# Patient Record
Sex: Female | Born: 1955 | Race: Black or African American | Hispanic: No | Marital: Single | Smoking: Current some day smoker
Health system: Southern US, Community
[De-identification: ages and names within clinical notes are randomized; demographics above are authoritative.]

## PROBLEM LIST (undated history)

## (undated) DIAGNOSIS — R0600 Dyspnea, unspecified: Secondary | ICD-10-CM

## (undated) DIAGNOSIS — G459 Transient cerebral ischemic attack, unspecified: Secondary | ICD-10-CM

## (undated) DIAGNOSIS — M1990 Unspecified osteoarthritis, unspecified site: Secondary | ICD-10-CM

## (undated) DIAGNOSIS — F319 Bipolar disorder, unspecified: Secondary | ICD-10-CM

## (undated) DIAGNOSIS — F419 Anxiety disorder, unspecified: Secondary | ICD-10-CM

## (undated) DIAGNOSIS — M51369 Other intervertebral disc degeneration, lumbar region without mention of lumbar back pain or lower extremity pain: Secondary | ICD-10-CM

## (undated) DIAGNOSIS — I499 Cardiac arrhythmia, unspecified: Secondary | ICD-10-CM

## (undated) DIAGNOSIS — R42 Dizziness and giddiness: Secondary | ICD-10-CM

## (undated) DIAGNOSIS — D649 Anemia, unspecified: Secondary | ICD-10-CM

## (undated) DIAGNOSIS — I2510 Atherosclerotic heart disease of native coronary artery without angina pectoris: Secondary | ICD-10-CM

## (undated) DIAGNOSIS — I639 Cerebral infarction, unspecified: Secondary | ICD-10-CM

## (undated) DIAGNOSIS — I4891 Unspecified atrial fibrillation: Secondary | ICD-10-CM

## (undated) DIAGNOSIS — K219 Gastro-esophageal reflux disease without esophagitis: Secondary | ICD-10-CM

## (undated) DIAGNOSIS — J449 Chronic obstructive pulmonary disease, unspecified: Secondary | ICD-10-CM

## (undated) DIAGNOSIS — G629 Polyneuropathy, unspecified: Secondary | ICD-10-CM

## (undated) DIAGNOSIS — M25562 Pain in left knee: Secondary | ICD-10-CM

## (undated) DIAGNOSIS — G8929 Other chronic pain: Secondary | ICD-10-CM

## (undated) DIAGNOSIS — M5126 Other intervertebral disc displacement, lumbar region: Secondary | ICD-10-CM

## (undated) DIAGNOSIS — M5136 Other intervertebral disc degeneration, lumbar region: Secondary | ICD-10-CM

## (undated) HISTORY — DX: Cerebral infarction, unspecified: I63.9

## (undated) HISTORY — DX: Transient cerebral ischemic attack, unspecified: G45.9

## (undated) HISTORY — PX: CHOLECYSTECTOMY: SHX55

## (undated) HISTORY — PX: CARPAL TUNNEL RELEASE: SHX101

## (undated) HISTORY — DX: Dizziness and giddiness: R42

## (undated) HISTORY — PX: ABDOMINAL HYSTERECTOMY: SHX81

---

## 2004-08-29 ENCOUNTER — Emergency Department (HOSPITAL_COMMUNITY): Admission: EM | Admit: 2004-08-29 | Discharge: 2004-08-29 | Payer: Self-pay | Admitting: *Deleted

## 2004-08-30 ENCOUNTER — Ambulatory Visit: Payer: Self-pay | Admitting: Internal Medicine

## 2004-08-30 ENCOUNTER — Ambulatory Visit: Payer: Self-pay | Admitting: *Deleted

## 2004-09-07 ENCOUNTER — Ambulatory Visit: Payer: Self-pay | Admitting: Family Medicine

## 2004-09-09 ENCOUNTER — Ambulatory Visit: Payer: Self-pay | Admitting: Internal Medicine

## 2005-03-17 ENCOUNTER — Inpatient Hospital Stay (HOSPITAL_COMMUNITY): Admission: EM | Admit: 2005-03-17 | Discharge: 2005-03-22 | Payer: Self-pay | Admitting: Emergency Medicine

## 2005-03-25 ENCOUNTER — Ambulatory Visit: Payer: Self-pay | Admitting: Family Medicine

## 2005-06-23 ENCOUNTER — Ambulatory Visit: Payer: Self-pay | Admitting: Internal Medicine

## 2005-06-27 ENCOUNTER — Ambulatory Visit: Payer: Self-pay | Admitting: Internal Medicine

## 2005-06-29 ENCOUNTER — Encounter: Payer: Self-pay | Admitting: *Deleted

## 2005-06-29 ENCOUNTER — Inpatient Hospital Stay (HOSPITAL_COMMUNITY): Admission: EM | Admit: 2005-06-29 | Discharge: 2005-07-05 | Payer: Self-pay | Admitting: Internal Medicine

## 2005-07-06 ENCOUNTER — Ambulatory Visit: Payer: Self-pay | Admitting: Internal Medicine

## 2005-07-06 ENCOUNTER — Emergency Department (HOSPITAL_COMMUNITY): Admission: EM | Admit: 2005-07-06 | Discharge: 2005-07-06 | Payer: Self-pay | Admitting: Emergency Medicine

## 2005-07-08 ENCOUNTER — Ambulatory Visit: Payer: Self-pay | Admitting: Internal Medicine

## 2005-07-12 ENCOUNTER — Ambulatory Visit: Payer: Self-pay | Admitting: Family Medicine

## 2005-07-14 ENCOUNTER — Ambulatory Visit: Payer: Self-pay | Admitting: Internal Medicine

## 2005-07-26 ENCOUNTER — Ambulatory Visit: Payer: Self-pay | Admitting: Internal Medicine

## 2007-02-16 DIAGNOSIS — F1410 Cocaine abuse, uncomplicated: Secondary | ICD-10-CM

## 2007-02-16 DIAGNOSIS — Z9189 Other specified personal risk factors, not elsewhere classified: Secondary | ICD-10-CM | POA: Insufficient documentation

## 2007-02-16 DIAGNOSIS — J45909 Unspecified asthma, uncomplicated: Secondary | ICD-10-CM | POA: Insufficient documentation

## 2007-02-16 DIAGNOSIS — E119 Type 2 diabetes mellitus without complications: Secondary | ICD-10-CM | POA: Insufficient documentation

## 2011-11-30 ENCOUNTER — Encounter (HOSPITAL_COMMUNITY): Payer: Self-pay | Admitting: *Deleted

## 2011-11-30 ENCOUNTER — Emergency Department (HOSPITAL_COMMUNITY)
Admission: EM | Admit: 2011-11-30 | Discharge: 2011-11-30 | Disposition: A | Discharge status: Home / Self Care | Payer: Self-pay | Attending: Emergency Medicine | Admitting: Emergency Medicine

## 2011-11-30 DIAGNOSIS — J45909 Unspecified asthma, uncomplicated: Secondary | ICD-10-CM | POA: Insufficient documentation

## 2011-11-30 DIAGNOSIS — F319 Bipolar disorder, unspecified: Secondary | ICD-10-CM | POA: Insufficient documentation

## 2011-11-30 DIAGNOSIS — E119 Type 2 diabetes mellitus without complications: Secondary | ICD-10-CM | POA: Insufficient documentation

## 2011-11-30 DIAGNOSIS — F17200 Nicotine dependence, unspecified, uncomplicated: Secondary | ICD-10-CM | POA: Insufficient documentation

## 2011-11-30 HISTORY — DX: Bipolar disorder, unspecified: F31.9

## 2011-11-30 LAB — URINALYSIS, ROUTINE W REFLEX MICROSCOPIC
Ketones, ur: NEGATIVE mg/dL
Nitrite: NEGATIVE
Protein, ur: NEGATIVE mg/dL

## 2011-11-30 LAB — RAPID URINE DRUG SCREEN, HOSP PERFORMED
Amphetamines: NOT DETECTED
Barbiturates: NOT DETECTED
Benzodiazepines: NOT DETECTED
Tetrahydrocannabinol: NOT DETECTED

## 2011-11-30 LAB — DIFFERENTIAL
Basophils Absolute: 0.1 10*3/uL (ref 0.0–0.1)
Basophils Relative: 1 % (ref 0–1)
Monocytes Absolute: 0.6 10*3/uL (ref 0.1–1.0)
Neutro Abs: 2.3 10*3/uL (ref 1.7–7.7)
Neutrophils Relative %: 32 % — ABNORMAL LOW (ref 43–77)

## 2011-11-30 LAB — BASIC METABOLIC PANEL
Calcium: 9.8 mg/dL (ref 8.4–10.5)
Chloride: 105 mEq/L (ref 96–112)
Creatinine, Ser: 0.81 mg/dL (ref 0.50–1.10)
GFR calc Af Amer: >90 mL/min (ref >90)
GFR calc non Af Amer: 81 mL/min — ABNORMAL LOW (ref >90)

## 2011-11-30 LAB — CBC
HCT: 40.6 % (ref 36.0–46.0)
MCHC: 33.7 g/dL (ref 30.0–36.0)
RDW: 14.4 % (ref 11.5–15.5)

## 2011-11-30 LAB — ETHANOL: Alcohol, Ethyl (B): <11 mg/dL (ref 0–11)

## 2011-11-30 MED ORDER — QUETIAPINE FUMARATE 25 MG PO TABS: 25.0000 mg | ORAL_TABLET | Freq: Every day | ORAL | Status: DC

## 2011-11-30 MED ORDER — LITHIUM CARBONATE 300 MG PO TABS: 300.0000 mg | ORAL_TABLET | Freq: Three times a day (TID) | ORAL | Status: DC

## 2011-11-30 NOTE — Discharge Instructions (Signed)
Manic Depression (Bipolar Disorder)  Bipolar disorder is also known as manic depressive illness. It is when the brain does not function properly and causes shifts in a person's moods, energy and ability to function in everyday life. These shifts are different from the normal ups and downs that everyone experiences. Instead the shifts are severe. If this goes untreated, the person's life becomes more and more disorderly. People with this disorder can be treated can lead full and productive lives. This disorder must be managed throughout life.   SYMPTOMS    Bipolar disorder causes dramatic mood swings. These mood swings go in cycles. They cycle from extreme "highs" and irritable to deep "lows" of sadness and hopelessness.   Between the extreme moods, there are usually periods of normal mood.   Along with the mood shifts, the person will have severe changes in energy and behavior. The periods of "highs" and "lows" are called episodes of mania and depression.  Signs of mania:   Lots of energy, activity and restlessness.   Extreme "high" or good mood.   Extreme irritability.   Racing thoughts and talking very fast.   Jumping from one idea to another.   Not able to focus, easily distracted.   Little need to sleep.   Grand beliefs in one's abilities and powers.   Spending sprees.   Increased sexual drive. This can result in many sexual partners.   Poor judgment.   Abuse of drugs, particularly cocaine, alcohol, and sleeping medication.   Aggressive or provocative behavior.   A lasting period of behavior that is different from usual.   Denial that anything is wrong.  *A manic episode is identified if a "high" mood happens with three or more of the other symptoms lasting most of the day, nearly everyday for a week or longer. If the mood is more irritable in nature, four additional symptoms must be present.  Signs of depression:   Lasting feelings of sadness, anxiety, or empty mood.   Feelings of  hopelessness with negative thoughts.   Feelings of guilt, worthlessness, or helplessness.   Loss of interest or pleasure in activities once enjoyed, including sex.   Feelings of fatigue or having less energy.   Trouble focusing, making decisions, remembering.   Feeling restless or irritable.   Sleeping too little or too much.   Change in eating with possible weight gain or loss.   Feeling ongoing pain that is not caused by physical illness or injury.   Thoughts of death or suicide or suicide attempts.  *A depressive episode is identified as having five or more of the above symptoms that last most of the day, nearly everyday for two weeks or longer.  CAUSES    Research shows that there is no single cause for the disorder. Many factors act together to produce the illness.   This can be passed down from family (hereditary).   Environment may play a part.  TREATMENT    Long-term treatment is strongly recommended because bipolar disorder is a repeated illness. This disorder is better controlled if treatment is ongoing than if it is off and on.   A combination of medication and talk therapy is best for managing the disorder over time.   Medication.   Medication can be prescribed by a doctor that is an expert in treating mental disorders (psychiatrists). Medications known as "mood stabilizers" are usually prescribed to help control the illness. Other medications can be added when needed. These medicines usually treat episodes   of mania or depression that break through despite the mood stabilizer.   Talk Therapy.   Along with medication, some forms of talk therapy are helpful in providing support, education and guidance to people with the illness and their families. Studies show that this type of treatment increases mood stability, decreases need for hospitalization and improves how they function society.   Electroconvulsive Therapy (ECT).   In extreme situations where the above treatments do not work or  work too slowly to relieve severe symptoms, ECT may be considered.  Document Released: 10/03/2000 Document Revised: 06/16/2011 Document Reviewed: 05/25/2007  [employer] Patient Information 2012 [employer].

## 2011-11-30 NOTE — ED Notes (Signed)
Called for a Telepsych consult. Paper work faxed.

## 2011-11-30 NOTE — ED Notes (Signed)
Up to bathroom, waiting for response from telepsych.

## 2011-11-30 NOTE — BH Assessment (Signed)
Assessment Note   Rhonda Hickman is an 56 y.o. female. The patient was seen earlier today at [facility] . She thought she was there to see a psychiatrist and have her medications restarted. She became agitated when she was told she could not she a physician  for 2 months. She said she would hold them responsible if anything happened that afternoon. At that time, the staff completed IVC paperwork on the patient . The patient l;eft and was served her papers at the [facility]. She denies any thoughts to harm herself or anyone else.  She denies any previous attempts. She denies any history of violence. She is not psychotic. She is a little agitated but is cooperative. At this time the patient denies all the alligations in the petition.   Axis I: Bipolar, Depressed Axis II: Deferred Axis III:  Past Medical History  Diagnosis Date  . Bipolar 1 disorder   . Diabetes mellitus   . Asthma    Axis IV: other psychosocial or environmental problems, problems related to social environment and problems with access to health care services Axis V: 41-50 serious symptoms  Past Medical History:  Past Medical History  Diagnosis Date  . Bipolar 1 disorder   . Diabetes mellitus   . Asthma     Past Surgical History  Procedure Date  . Abdominal hysterectomy     Family History: No family history on file.  Social History:  reports that she has been smoking.  She does not have any smokeless tobacco history on file. She reports that she does not drink alcohol or use illicit drugs.  Additional Social History:    Allergies:  Allergies  Allergen Reactions  . Aspirin Nausea Only    Causes stomach pain  . Codeine     Does not like to take anything with codeine in it due to being recovering addict    Home Medications:  (Not in a hospital admission)  OB/GYN Status:  No LMP recorded. Patient has had a hysterectomy.  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 Previous Attempts/Gestures: No How many times?: 0  Other Self Harm Risks: denies Triggers for Past Attempts: None known Intentional Self Injurious Behavior: None Family Suicide History: No Recent stressful life event(s): Recent negative physical changes (moved to [facility]) Persecutory voices/beliefs?: No Depression: Yes Depression Symptoms: Isolating;Loss of interest in usual pleasures;Feeling angry/irritable Substance abuse history and/or treatment for substance abuse?: Yes Suicide prevention information given to non-admitted patients: Yes  Risk to Others Homicidal Ideation: No Thoughts of Harm to Others: No Current Homicidal Intent: No Current Homicidal Plan: No Access to Homicidal Means: No History of harm to others?: No Assessment of Violence: None Noted Does patient have access to weapons?: No Criminal Charges Pending?: No Does patient have a court date: No  Psychosis Hallucinations: None noted Delusions: None noted  Mental Status Report Appear/Hygiene: Improved Eye Contact: Good Motor Activity: Restlessness;Agitation Speech: Rapid;Logical/coherent Level of Consciousness:  Alert;Restless Mood: Anxious;Depressed Affect: Appropriate to circumstance Anxiety Level: Minimal Thought Processes: Coherent;Relevant Judgement: Unimpaired Orientation: Person;Place;Time;Situation Obsessive  Compulsive Thoughts/Behaviors: None  Cognitive Functioning Concentration: Decreased Memory: Recent Intact;Remote Intact IQ: Average Insight: Fair Impulse Control: Fair Appetite: Good Sleep: Decreased Vegetative Symptoms: None  Prior Inpatient Therapy Prior Inpatient Therapy: Yes Prior Therapy Dates: 2006 or 2007 Prior Therapy Facilty/Provider(s): [place] Reason for Treatment: depresive issues;medication adjustment  Prior Outpatient Therapy Prior Outpatient Therapy: Yes Prior Therapy Dates: was at [facility] today Prior Therapy Facilty/Provider(s): [employer] Reason for Treatment: seeking medications            Values / Beliefs Cultural Requests During Hospitalization: None Spiritual Requests During Hospitalization: None        Additional Information 1:1 In Past 12 Months?: No CIRT Risk: No Elopement Risk: No Does patient have medical clearance?: Yes     Disposition: Patient will be seen for tele-psych to determine the need to proceed with the commitment or to recommend medications and out patient care. Dr.  Adriana Simas in agreement with this plan. C Disposition Disposition of Patient: Other dispositions (recommended tele-psych for commitment evaluation and or medi) Other disposition(s): Other (Comment) (tele-psych)  On Site Evaluation by:   Reviewed with Physician:     Jake Shark [facility] 11/30/2011 4:04 PM

## 2011-11-30 NOTE — ED Notes (Signed)
Meal tray given to pt.

## 2011-11-30 NOTE — ED Notes (Signed)
Still waiting for telepsych consult

## 2011-11-30 NOTE — ED Notes (Signed)
telepsych interview

## 2011-11-30 NOTE — ED Notes (Signed)
Quiet at present, still waiting for telepsych evaluation

## 2011-11-30 NOTE — ED Provider Notes (Signed)
Psychiatry consult complete. Patient has no delusions or cardiac impairment. She denies any suicidal intent or plan. Her IVC papers are rescinded. We'll refill lithium and Seroquel. Followup with community resources this week. BP 154/117  Pulse 93  Temp(Src) 98.2 F (36.8 C) (Oral)  Resp 20  Ht 6' (1.829 m)  Wt 240 lb (108.863 kg)  BMI 32.55 kg/m2  SpO2 97%   Glynn Octave, MD 11/30/11 2026

## 2011-11-30 NOTE — ED Notes (Signed)
Pt alert & oriented x4, stable gait. Pt given discharge instructions, paperwork & prescription(s). Pt verbalized understanding. Pt left department w/ no further questions.

## 2011-11-30 NOTE — ED Provider Notes (Signed)
History     CSN: [id number]  Arrival date & time 11/30/11  1309   First MD Initiated Contact with Patient 11/30/11 1337      Chief Complaint  Patient presents with  . V70.1    (Consider location/radiation/quality/duration/timing/severity/associated sxs/prior treatment) HPI......involuntary commitment on patient who initially presented to local mental health agency to get back on her bipolar medications.  Apparently she was told that she could not see a physician for a couple weeks. She then stated that she would hold the Department responsible for her actions. At that point she was committed. Denies suicidal or homicidal ideation. She apparently goes on meds q2-3 years when she has a flareup of her bipolar illness  Past Medical History  Diagnosis Date  . Bipolar 1 disorder   . Diabetes mellitus   . Asthma     Past Surgical History  Procedure Date  . Abdominal hysterectomy     No family history on file.  History  Substance Use Topics  . Smoking status: Current Everyday Smoker  . Smokeless tobacco: Not on file  . Alcohol Use: No    OB History    Grav Para Term Preterm Abortions TAB SAB Ect Mult Living                  Review of Systems  All other systems reviewed and are negative.    Allergies  Aspirin and Codeine  Home Medications   Current Outpatient Rx  Name Route Sig Dispense Refill  . NAPROXEN SODIUM 220 MG PO TABS Oral Take 1,320 mg by mouth 2 (two) times daily with a meal.      BP 154/117  Pulse 93  Temp(Src) 98.2 F (36.8 C) (Oral)  Resp 20  Ht 6' (1.829 m)  Wt 240 lb (108.863 kg)  BMI 32.55 kg/m2  SpO2 97%  Physical Exam  Nursing note and vitals reviewed. Constitutional: She is oriented to person, place, and time. She appears well-developed and well-nourished.  HENT:  Head: Normocephalic and atraumatic.  Eyes: Conjunctivae and EOM are normal. Pupils are equal, round, and reactive to light.  Neck: Normal range of motion. Neck supple.    Cardiovascular: Normal rate and regular rhythm.   Pulmonary/Chest: Effort normal and breath sounds normal.  Abdominal: Soft. Bowel sounds are normal.  Musculoskeletal: Normal range of motion.  Neurological: She is alert and oriented to person, place, and time.  Skin: Skin is warm and dry.  Psychiatric:       Slight pressured speech and flight of ideas    ED Course  Procedures (including critical care time)  Labs Reviewed  GLUCOSE, CAPILLARY - Abnormal; Notable for the following:    Glucose-Capillary 130 (*)    All other components within normal limits  URINALYSIS, ROUTINE W REFLEX MICROSCOPIC - Abnormal; Notable for the following:    Leukocytes, UA SMALL (*)    All other components within normal limits  DIFFERENTIAL - Abnormal; Notable for the following:    Neutrophils Relative 32 (*)    Lymphocytes Relative 53 (*)    Eosinophils Relative 6 (*)    All other components within normal limits  BASIC METABOLIC PANEL - Abnormal; Notable for the following:    Glucose, Bld 137 (*)    GFR calc non Af Amer 81 (*)    All other components within normal limits  URINE RAPID DRUG SCREEN (HOSP PERFORMED)  CBC  ETHANOL  URINE MICROSCOPIC-ADD ON   No results found.   No diagnosis found.  MDM  Tele psychiatry consult pending.  Awaiting medication recommendations        Donnetta Hutching, MD 11/30/11 1622

## 2011-11-30 NOTE — ED Notes (Signed)
Pt brought to er by [facility] [employer] from [facility] with  IVC. Pt states that she has bipolar and knows that she needs  medications, pt states that she had went to [facility] for help with her bipolar she has not been taking her medications for bipolar for "awhile" , had began to feel anger and rage and knew she needed to get back on her medications, pt was talking to counselors at [facility] and was not allowed to see a dr. There, Pt was going to see someone at health departmntn as well and made statements to staff at [facility] that she would hold them responsible for anything that would happen at the health department. Pt denies any SI/HI at present time, pt in handcuffs with [facility] [employer] department at bedside,

## 2012-02-07 ENCOUNTER — Emergency Department (HOSPITAL_COMMUNITY)
Admission: EM | Admit: 2012-02-07 | Discharge: 2012-02-07 | Disposition: A | Discharge status: Home / Self Care | Payer: Self-pay | Attending: Emergency Medicine | Admitting: Emergency Medicine

## 2012-02-07 ENCOUNTER — Emergency Department (HOSPITAL_COMMUNITY): Payer: Self-pay

## 2012-02-07 ENCOUNTER — Encounter (HOSPITAL_COMMUNITY): Payer: Self-pay | Admitting: *Deleted

## 2012-02-07 DIAGNOSIS — R079 Chest pain, unspecified: Secondary | ICD-10-CM | POA: Insufficient documentation

## 2012-02-07 DIAGNOSIS — R209 Unspecified disturbances of skin sensation: Secondary | ICD-10-CM | POA: Insufficient documentation

## 2012-02-07 DIAGNOSIS — J45909 Unspecified asthma, uncomplicated: Secondary | ICD-10-CM | POA: Insufficient documentation

## 2012-02-07 DIAGNOSIS — F17200 Nicotine dependence, unspecified, uncomplicated: Secondary | ICD-10-CM | POA: Insufficient documentation

## 2012-02-07 DIAGNOSIS — R0602 Shortness of breath: Secondary | ICD-10-CM | POA: Insufficient documentation

## 2012-02-07 DIAGNOSIS — J45901 Unspecified asthma with (acute) exacerbation: Secondary | ICD-10-CM

## 2012-02-07 DIAGNOSIS — R200 Anesthesia of skin: Secondary | ICD-10-CM

## 2012-02-07 LAB — CBC
MCH: 28.9 pg (ref 26.0–34.0)
MCHC: 33.7 g/dL (ref 30.0–36.0)
Platelets: 231 10*3/uL (ref 150–400)
RDW: 14.5 % (ref 11.5–15.5)

## 2012-02-07 LAB — BASIC METABOLIC PANEL
BUN: 11 mg/dL (ref 6–23)
Calcium: 9.5 mg/dL (ref 8.4–10.5)
Creatinine, Ser: 0.79 mg/dL (ref 0.50–1.10)
GFR calc non Af Amer: >90 mL/min (ref >90)
Glucose, Bld: 158 mg/dL — ABNORMAL HIGH (ref 70–99)
Sodium: 141 mEq/L (ref 135–145)

## 2012-02-07 MED ORDER — ALBUTEROL SULFATE (5 MG/ML) 0.5% IN NEBU
5.0000 mg | INHALATION_SOLUTION | Freq: Once | RESPIRATORY_TRACT | Status: AC
  Administered 2012-02-07: 5 mg via RESPIRATORY_TRACT
  Filled 2012-02-07: qty 1

## 2012-02-07 MED ORDER — METHYLPREDNISOLONE SODIUM SUCC 125 MG IJ SOLR
125.0000 mg | Freq: Once | INTRAMUSCULAR | Status: AC
  Administered 2012-02-07: 125 mg via INTRAVENOUS
  Filled 2012-02-07: qty 2

## 2012-02-07 MED ORDER — IPRATROPIUM BROMIDE 0.02 % IN SOLN
0.5000 mg | Freq: Once | RESPIRATORY_TRACT | Status: AC
  Administered 2012-02-07: 0.5 mg via RESPIRATORY_TRACT
  Filled 2012-02-07: qty 2.5

## 2012-02-07 MED ORDER — PREDNISONE 50 MG PO TABS: ORAL_TABLET | ORAL | Status: AC

## 2012-02-07 NOTE — ED Notes (Signed)
Speech clear, neg neuro exam.

## 2012-02-07 NOTE — ED Notes (Signed)
Left sided numbness x 1 month.  Pt reports she was waiting for her upcoming MD appt.  [facility]  Also reporting worsening asthma since this morning.  C/o left sided pain.

## 2012-02-07 NOTE — ED Notes (Signed)
Pt ambulated around nurse's stations without difficulty.  Denies resp distress during ambulation.  Pulse ox after returning to room 100% on RA.  nad noted.  edp notified.

## 2012-02-07 NOTE — ED Provider Notes (Signed)
History  This chart was scribed for Rhonda Gaskins, MD by Rhonda Hickman. This patient was seen in room [facility] and the patient's care was started at 10:09AM.  CSN: [id number]  Arrival date & time 02/07/12  0959   First MD Initiated Contact with Patient 02/07/12 1009      Chief Complaint  Patient presents with  . left side numbness   . Asthma    Patient is a 56 y.o. female presenting with asthma. The history is provided by the patient. No language interpreter was used.  Asthma This is a recurrent problem. The current episode started more than 1 week ago. The problem occurs constantly. The problem has been gradually worsening. Associated symptoms include chest pain and shortness of breath. Pertinent negatives include no abdominal pain and no headaches. Nothing relieves the symptoms.    Rhonda Hickman is a 56 y.o. female who presents to the Emergency Department from the [facility] Department complaining of one month of gradual onset, non-changing, constant left sided numbness starting at the left shoulder and radiating down to her left big toe. She denies facial numbness. She denies weakness, difficulty swallowing or speaking. She reports having a PCP appointment scheduled next week but states that she didn't want to put off her evaluation any longer.  She also c/o one month of gradually worsening, constant non-productive cough with associated left-sided CP and increased SOB attributed to asthma. The CP is described as sharp that is worse with coughing and does not have pain at this time. She states that she was seen at [facility] 2 weeks ago for a syncopal episode attributed to her asthma and was given an albuterol inhaler with no improvement in her symptoms. She denies fever, neck pain, sore throat, visual disturbance, abdominal pain, nausea, emesis, diarrhea, urinary symptoms, back pain, HA, weakness, and rash as associated symptoms.   She is a current everyday smoker but denies  alcohol use.   Past Medical History  Diagnosis Date  . Bipolar 1 disorder   . Diabetes mellitus   . Asthma     Past Surgical History  Procedure Date  . Abdominal hysterectomy     No family history on file.  History  Substance Use Topics  . Smoking status: Current Everyday Smoker  . Smokeless tobacco: Not on file  . Alcohol Use: No     Review of Systems  Constitutional: Negative for fever and chills.  Eyes: Negative for visual disturbance.  Respiratory: Positive for cough, shortness of breath and wheezing.   Cardiovascular: Positive for chest pain.  Gastrointestinal: Negative for nausea, vomiting, abdominal pain and diarrhea.  Neurological: Positive for numbness. Negative for speech difficulty, weakness and headaches.  All other systems reviewed and are negative.    Allergies  Aspirin and Codeine  Home Medications   Current Outpatient Rx  Name Route Sig Dispense Refill  . LITHIUM CARBONATE 300 MG PO TABS Oral Take 1 tablet (300 mg total) by mouth 3 (three) times daily. 30 tablet 0  . NAPROXEN SODIUM 220 MG PO TABS Oral Take 1,320 mg by mouth 2 (two) times daily with a meal.      Triage Vitals: BP 119/74  Pulse 73  Temp 97.7 F (36.5 C) (Oral)  Resp 22  Ht 6' (1.829 m)  Wt 265 lb (120.203 kg)  BMI 35.94 kg/m2  SpO2 97%  Physical Exam  Nursing note and vitals reviewed.  CONSTITUTIONAL: Well developed/well nourished HEAD AND FACE: Normocephalic/atraumatic EYES: EOMI/PERRL ENMT: Mucous membranes moist NECK:  supple no meningeal signs SPINE:entire spine nontender CV: S1/S2 noted, no murmurs/rubs/gallops noted LUNGS: Lungs are clear to auscultation bilaterally, no apparent distress ABDOMEN: soft, nontender, no rebound or guarding GU:no cva tenderness NEURO: Pt is awake/alert, moves all extremitiesx4, no arm or leg drift is noted Reports paresthesias to her left arm/leg.  No focal weakness, and normal grips.  Face is symmetric EXTREMITIES: pulses normal,  full ROM SKIN: warm, color normal PSYCH: no abnormalities of mood noted  ED Course  Procedures   DIAGNOSTIC STUDIES: Oxygen Saturation is 97% on room air, adequate by my interpretation.    COORDINATION OF CARE: 10:28AM-Discussed treatment plan which includes a CXR and breathing treatment with pt at bedside and pt agreed to plan.  Pt improved.  She ambulated without difficulty.  For her asthma, reports similar to prior, she feels improved will start oral prednisone.  For her numbness, this is unchanged without new weakness today or changes today.  CT imaging ?demyelinating disease and I advised close f/u with her PCP and neurologist (referral given)   MDM  Nursing notes including past medical history and social history reviewed and considered in documentation Labs/vital reviewed and considered xrays reviewed and considered   Date: 02/07/2012  Rate: 74  Rhythm: normal sinus rhythm  QRS Axis: normal  Intervals: normal  ST/T Wave abnormalities: normal  Conduction Disutrbances:none    I personally performed the services described in this documentation, which was scribed in my presence. The recorded information has been reviewed and considered.         Rhonda Gaskins, MD 02/07/12 1425

## 2012-04-20 ENCOUNTER — Emergency Department (HOSPITAL_COMMUNITY)
Admission: EM | Admit: 2012-04-20 | Discharge: 2012-04-20 | Discharge status: Against Medical Advice | Payer: Self-pay | Attending: Emergency Medicine | Admitting: Emergency Medicine

## 2012-04-20 ENCOUNTER — Encounter (HOSPITAL_COMMUNITY): Payer: Self-pay | Admitting: *Deleted

## 2012-04-20 DIAGNOSIS — R404 Transient alteration of awareness: Secondary | ICD-10-CM | POA: Insufficient documentation

## 2012-04-20 HISTORY — DX: Anxiety disorder, unspecified: F41.9

## 2012-04-20 LAB — GLUCOSE, CAPILLARY: Glucose-Capillary: 193 mg/dL — ABNORMAL HIGH (ref 70–99)

## 2012-04-20 MED ORDER — ALBUTEROL SULFATE (5 MG/ML) 0.5% IN NEBU
INHALATION_SOLUTION | RESPIRATORY_TRACT | Status: AC
  Administered 2012-04-20: 5 mg
  Filled 2012-04-20: qty 1

## 2012-04-20 MED ORDER — IPRATROPIUM BROMIDE 0.02 % IN SOLN
RESPIRATORY_TRACT | Status: AC
  Administered 2012-04-20: 0.5 mg
  Filled 2012-04-20: qty 2.5

## 2012-04-20 NOTE — ED Notes (Signed)
Patient was at work at [employer] and was unconscious per staff. Upon arrival to er she was alert and oriented to place time and not situation. She stated she did not know what had happened and that she thought it was an anxiety attack

## 2012-04-20 NOTE — ED Notes (Signed)
Patient states she is ready to go, states she is working 2 jobs every day and refuses to stay to be evaluated, ama form signed

## 2012-04-20 NOTE — ED Notes (Signed)
Went in patients room to get her to sign ama papers per her request and she continued to walk out with female at side and stated she did not want to come to hospital and was leaving.

## 2012-04-20 NOTE — ED Notes (Signed)
Patient states she feels fine and does not need anything at this time.

## 2012-04-20 NOTE — ED Notes (Signed)
Patient given breathing tx per respiratory. Questioned respiratory and was informed that pt was wheezing.

## 2012-05-25 ENCOUNTER — Emergency Department (HOSPITAL_COMMUNITY)
Admission: EM | Admit: 2012-05-25 | Discharge: 2012-05-26 | Disposition: A | Discharge status: Home / Self Care | Payer: Self-pay | Attending: Emergency Medicine | Admitting: Emergency Medicine

## 2012-05-25 ENCOUNTER — Encounter (HOSPITAL_COMMUNITY): Payer: Self-pay | Admitting: *Deleted

## 2012-05-25 DIAGNOSIS — F329 Major depressive disorder, single episode, unspecified: Secondary | ICD-10-CM | POA: Insufficient documentation

## 2012-05-25 DIAGNOSIS — F319 Bipolar disorder, unspecified: Secondary | ICD-10-CM | POA: Insufficient documentation

## 2012-05-25 DIAGNOSIS — E119 Type 2 diabetes mellitus without complications: Secondary | ICD-10-CM | POA: Insufficient documentation

## 2012-05-25 DIAGNOSIS — Z79899 Other long term (current) drug therapy: Secondary | ICD-10-CM | POA: Insufficient documentation

## 2012-05-25 DIAGNOSIS — F17200 Nicotine dependence, unspecified, uncomplicated: Secondary | ICD-10-CM | POA: Insufficient documentation

## 2012-05-25 DIAGNOSIS — J45909 Unspecified asthma, uncomplicated: Secondary | ICD-10-CM | POA: Insufficient documentation

## 2012-05-25 DIAGNOSIS — F3289 Other specified depressive episodes: Secondary | ICD-10-CM | POA: Insufficient documentation

## 2012-05-25 DIAGNOSIS — F411 Generalized anxiety disorder: Secondary | ICD-10-CM | POA: Insufficient documentation

## 2012-05-25 LAB — BASIC METABOLIC PANEL
Calcium: 9.2 mg/dL (ref 8.4–10.5)
Creatinine, Ser: 0.95 mg/dL (ref 0.50–1.10)
GFR calc Af Amer: 77 mL/min — ABNORMAL LOW (ref >90)
GFR calc non Af Amer: 66 mL/min — ABNORMAL LOW (ref >90)
Sodium: 138 mEq/L (ref 135–145)

## 2012-05-25 LAB — CBC WITH DIFFERENTIAL/PLATELET
Basophils Absolute: 0 10*3/uL (ref 0.0–0.1)
Basophils Relative: 0 % (ref 0–1)
Eosinophils Relative: 1 % (ref 0–5)
HCT: 36.3 % (ref 36.0–46.0)
Lymphocytes Relative: 47 % — ABNORMAL HIGH (ref 12–46)
MCHC: 33.6 g/dL (ref 30.0–36.0)
MCV: 86.2 fL (ref 78.0–100.0)
Monocytes Absolute: 0.6 10*3/uL (ref 0.1–1.0)
Neutro Abs: 5.1 10*3/uL (ref 1.7–7.7)
Platelets: 220 10*3/uL (ref 150–400)
RDW: 14.3 % (ref 11.5–15.5)
WBC: 11.2 10*3/uL — ABNORMAL HIGH (ref 4.0–10.5)

## 2012-05-25 LAB — VALPROIC ACID LEVEL: Valproic Acid Lvl: 42.9 ug/mL — ABNORMAL LOW (ref 50.0–100.0)

## 2012-05-25 LAB — ETHANOL: Alcohol, Ethyl (B): <11 mg/dL (ref 0–11)

## 2012-05-25 LAB — RAPID URINE DRUG SCREEN, HOSP PERFORMED: Opiates: NOT DETECTED

## 2012-05-25 LAB — GLUCOSE, CAPILLARY: Glucose-Capillary: 213 mg/dL — ABNORMAL HIGH (ref 70–99)

## 2012-05-25 NOTE — ED Provider Notes (Signed)
History     CSN: [id number]  Arrival date & time 05/25/12  2022   First MD Initiated Contact with Patient 05/25/12 2041      Chief Complaint  Patient presents with  . V70.1    (Consider location/radiation/quality/duration/timing/severity/associated sxs/prior treatment) HPI...Marland Kitchendepressed, crying for 3 days.  Has had one previous psychiatric hospitalization years ago.  Uncertain etiology of depression. No frank suicidal or homicidal ideation. Nothing makes symptoms better or worse. Severity is moderate.  Past Medical History  Diagnosis Date  . Bipolar 1 disorder   . Diabetes mellitus   . Asthma   . Anxiety     Past Surgical History  Procedure Date  . Abdominal hysterectomy     History reviewed. No pertinent family history.  History  Substance Use Topics  . Smoking status: Current Every Day Smoker  . Smokeless tobacco: Not on file  . Alcohol Use: No    OB History    Grav Para Term Preterm Abortions TAB SAB Ect Mult Living                  Review of Systems  All other systems reviewed and are negative.    Allergies  Aspirin and Codeine  Home Medications   Current Outpatient Rx  Name  Route  Sig  Dispense  Refill  . ALBUTEROL SULFATE (2.5 MG/3ML) 0.083% IN NEBU   Nebulization   Take 2.5 mg by nebulization every 6 (six) hours as needed.         Marland Kitchen DIVALPROEX SODIUM 500 MG PO TBEC   Oral   Take 500 mg by mouth 2 (two) times daily.         Marland Kitchen METFORMIN HCL 1000 MG PO TABS   Oral   Take 1,000 mg by mouth 2 (two) times daily with a meal.         . NAPROXEN SODIUM 220 MG PO TABS   Oral   Take 440 mg by mouth 2 (two) times daily with a meal.         . TRAZODONE HCL 100 MG PO TABS   Oral   Take 100 mg by mouth at bedtime as needed.           BP 149/99  Pulse 85  Temp 98.5 F (36.9 C) (Oral)  Resp 18  Ht 6' (1.829 m)  Wt 275 lb (124.739 kg)  BMI 37.30 kg/m2  SpO2 99%  Physical Exam  Nursing note and vitals reviewed. Constitutional:  She is oriented to person, place, and time. She appears well-developed and well-nourished.  HENT:  Head: Normocephalic and atraumatic.  Eyes: Conjunctivae normal and EOM are normal. Pupils are equal, round, and reactive to light.  Neck: Normal range of motion. Neck supple.  Cardiovascular: Normal rate, regular rhythm and normal heart sounds.   Pulmonary/Chest: Effort normal and breath sounds normal.  Abdominal: Soft. Bowel sounds are normal.  Musculoskeletal: Normal range of motion.  Neurological: She is alert and oriented to person, place, and time.  Skin: Skin is warm and dry.  Psychiatric:       Flat affect, depressed    ED Course  Procedures (including critical care time)  Labs Reviewed  CBC WITH DIFFERENTIAL - Abnormal; Notable for the following:    WBC 11.2 (*)     Lymphocytes Relative 47 (*)     Lymphs Abs 5.3 (*)     All other components within normal limits  URINE RAPID DRUG SCREEN (HOSP PERFORMED)  BASIC METABOLIC  PANEL  ETHANOL  VALPROIC ACID LEVEL   No results found.   No diagnosis found.    MDM  Behavioral health consult obtained.  Discussed with Dr Valinda Hoar, MD 05/25/12 2144

## 2012-05-25 NOTE — ED Notes (Signed)
Pt in need of her regular nightime medications. Dr Colon Branch aware.
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
## 2012-05-25 NOTE — ED Notes (Signed)
Pt crying inconsolable at times and then laughing hysterically.

## 2012-05-25 NOTE — ED Notes (Signed)
Feels depressed, crying for 3 days

## 2012-05-25 NOTE — ED Notes (Signed)
EDP speaking with pt. 

## 2012-05-26 MED ORDER — LORAZEPAM 1 MG PO TABS: 1.0000 mg | ORAL_TABLET | Freq: Three times a day (TID) | ORAL | Status: DC | PRN

## 2012-05-26 MED ORDER — INSULIN GLARGINE 100 UNIT/ML ~~LOC~~ SOLN
10.0000 [IU] | Freq: Every day | SUBCUTANEOUS | Status: DC
  Administered 2012-05-26: 10 [IU] via SUBCUTANEOUS

## 2012-05-26 MED ORDER — TRAZODONE HCL 50 MG PO TABS
ORAL_TABLET | ORAL | Status: AC
  Filled 2012-05-26: qty 2

## 2012-05-26 MED ORDER — ONDANSETRON HCL 4 MG PO TABS: 4.0000 mg | ORAL_TABLET | Freq: Three times a day (TID) | ORAL | Status: DC | PRN

## 2012-05-26 MED ORDER — LISINOPRIL 2.5 MG PO TABS
2.5000 mg | ORAL_TABLET | Freq: Every day | ORAL | Status: DC
  Filled 2012-05-26 (×2): qty 1

## 2012-05-26 MED ORDER — DIVALPROEX SODIUM 250 MG PO DR TAB: 500.0000 mg | DELAYED_RELEASE_TABLET | Freq: Once | ORAL | Status: DC

## 2012-05-26 MED ORDER — ACETAMINOPHEN 325 MG PO TABS: 650.0000 mg | ORAL_TABLET | ORAL | Status: DC | PRN

## 2012-05-26 MED ORDER — METFORMIN HCL 500 MG PO TABS
1000.0000 mg | ORAL_TABLET | Freq: Two times a day (BID) | ORAL | Status: DC
  Administered 2012-05-26: 1000 mg via ORAL
  Filled 2012-05-26: qty 2

## 2012-05-26 MED ORDER — TRAZODONE HCL 100 MG PO TABS
100.0000 mg | ORAL_TABLET | Freq: Every day | ORAL | Status: DC
  Administered 2012-05-26: 100 mg via ORAL
  Filled 2012-05-26 (×2): qty 1

## 2012-05-26 MED ORDER — ALBUTEROL SULFATE (5 MG/ML) 0.5% IN NEBU
2.5000 mg | INHALATION_SOLUTION | RESPIRATORY_TRACT | Status: DC
  Filled 2012-05-26: qty 0.5

## 2012-05-26 MED ORDER — DIVALPROEX SODIUM ER 500 MG PO TB24: ORAL_TABLET | ORAL | Status: DC

## 2012-05-26 MED ORDER — DIVALPROEX SODIUM 250 MG PO DR TAB
500.0000 mg | DELAYED_RELEASE_TABLET | Freq: Two times a day (BID) | ORAL | Status: DC
  Administered 2012-05-26: 500 mg via ORAL
  Filled 2012-05-26: qty 2

## 2012-05-26 MED ORDER — INSULIN GLARGINE 100 UNIT/ML ~~LOC~~ SOLN
SUBCUTANEOUS | Status: AC
  Filled 2012-05-26: qty 1

## 2012-05-26 MED ORDER — NAPROXEN SODIUM 275 MG PO TABS
550.0000 mg | ORAL_TABLET | Freq: Two times a day (BID) | ORAL | Status: DC
  Filled 2012-05-26 (×3): qty 2

## 2012-05-26 MED ORDER — NAPROXEN 250 MG PO TABS: 500.0000 mg | ORAL_TABLET | Freq: Two times a day (BID) | ORAL | Status: DC

## 2012-05-26 NOTE — ED Notes (Signed)
Pt given meal tray for breakfast.  Belongings returned.

## 2012-05-26 NOTE — ED Notes (Signed)
Pt to sleep here until daylight, then call for ride home. Sitter released

## 2012-05-26 NOTE — ED Notes (Addendum)
Awake, up to bathroom, breakfast ordered
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
## 2012-05-26 NOTE — ED Notes (Signed)
Has been up to bathroom once, otherwise sleeping

## 2012-05-26 NOTE — ED Notes (Signed)
Telepsych consult being done now. 

## 2012-05-26 NOTE — Progress Notes (Signed)
Pt states she only takes nebs prn for wheezes and refused now

## 2012-05-26 NOTE — ED Notes (Addendum)
Patient given several packs of saltine crackers and peanut butter. Given water refused to drink it she wanted to have another Sprite Zero after already having three prior to this. Made her nurse aware of this. Explained to patient that she could not have anymore soft drinks tonight. She stated she was going to go to sleep.

## 2012-06-21 ENCOUNTER — Emergency Department (HOSPITAL_COMMUNITY): Payer: Self-pay

## 2012-06-21 ENCOUNTER — Encounter (HOSPITAL_COMMUNITY): Payer: Self-pay | Admitting: *Deleted

## 2012-06-21 ENCOUNTER — Emergency Department (HOSPITAL_COMMUNITY)
Admission: EM | Admit: 2012-06-21 | Discharge: 2012-06-21 | Disposition: A | Discharge status: Home / Self Care | Payer: Self-pay | Attending: Emergency Medicine | Admitting: Emergency Medicine

## 2012-06-21 DIAGNOSIS — J3489 Other specified disorders of nose and nasal sinuses: Secondary | ICD-10-CM | POA: Insufficient documentation

## 2012-06-21 DIAGNOSIS — E119 Type 2 diabetes mellitus without complications: Secondary | ICD-10-CM | POA: Insufficient documentation

## 2012-06-21 DIAGNOSIS — F17200 Nicotine dependence, unspecified, uncomplicated: Secondary | ICD-10-CM | POA: Insufficient documentation

## 2012-06-21 DIAGNOSIS — Z79899 Other long term (current) drug therapy: Secondary | ICD-10-CM | POA: Insufficient documentation

## 2012-06-21 DIAGNOSIS — R509 Fever, unspecified: Secondary | ICD-10-CM | POA: Insufficient documentation

## 2012-06-21 DIAGNOSIS — F411 Generalized anxiety disorder: Secondary | ICD-10-CM | POA: Insufficient documentation

## 2012-06-21 DIAGNOSIS — Z794 Long term (current) use of insulin: Secondary | ICD-10-CM | POA: Insufficient documentation

## 2012-06-21 DIAGNOSIS — R059 Cough, unspecified: Secondary | ICD-10-CM | POA: Insufficient documentation

## 2012-06-21 DIAGNOSIS — J45909 Unspecified asthma, uncomplicated: Secondary | ICD-10-CM | POA: Insufficient documentation

## 2012-06-21 DIAGNOSIS — J029 Acute pharyngitis, unspecified: Secondary | ICD-10-CM | POA: Insufficient documentation

## 2012-06-21 DIAGNOSIS — F319 Bipolar disorder, unspecified: Secondary | ICD-10-CM | POA: Insufficient documentation

## 2012-06-21 DIAGNOSIS — R05 Cough: Secondary | ICD-10-CM | POA: Insufficient documentation

## 2012-06-21 LAB — CBC WITH DIFFERENTIAL/PLATELET
Basophils Absolute: 0 10*3/uL (ref 0.0–0.1)
HCT: 40.1 % (ref 36.0–46.0)
Hemoglobin: 13.4 g/dL (ref 12.0–15.0)
Lymphocytes Relative: 34 % (ref 12–46)
Monocytes Relative: 9 % (ref 3–12)
Neutro Abs: 5.1 10*3/uL (ref 1.7–7.7)
RDW: 14.2 % (ref 11.5–15.5)
WBC: 9.1 10*3/uL (ref 4.0–10.5)

## 2012-06-21 LAB — BASIC METABOLIC PANEL
BUN: 11 mg/dL (ref 6–23)
Chloride: 101 mEq/L (ref 96–112)
GFR calc Af Amer: >90 mL/min (ref >90)
Potassium: 4.1 mEq/L (ref 3.5–5.1)

## 2012-06-21 LAB — RAPID STREP SCREEN (MED CTR MEBANE ONLY): Streptococcus, Group A Screen (Direct): NEGATIVE

## 2012-06-21 MED ORDER — MAGIC MOUTHWASH W/LIDOCAINE: 10.0000 mL | Freq: Four times a day (QID) | ORAL | Status: DC | PRN

## 2012-06-21 MED ORDER — ONDANSETRON HCL 4 MG/2ML IJ SOLN
4.0000 mg | Freq: Once | INTRAMUSCULAR | Status: AC
  Administered 2012-06-21: 4 mg via INTRAVENOUS
  Filled 2012-06-21: qty 2

## 2012-06-21 MED ORDER — AMOXICILLIN 500 MG PO CAPS: 500.0000 mg | ORAL_CAPSULE | Freq: Three times a day (TID) | ORAL | Status: DC

## 2012-06-21 MED ORDER — MORPHINE SULFATE 4 MG/ML IJ SOLN
4.0000 mg | Freq: Once | INTRAMUSCULAR | Status: AC
  Administered 2012-06-21: 4 mg via INTRAVENOUS
  Filled 2012-06-21: qty 1

## 2012-06-21 MED ORDER — ALBUTEROL SULFATE (5 MG/ML) 0.5% IN NEBU
5.0000 mg | INHALATION_SOLUTION | Freq: Once | RESPIRATORY_TRACT | Status: DC
  Filled 2012-06-21: qty 1

## 2012-06-21 MED ORDER — IBUPROFEN 600 MG PO TABS: 600.0000 mg | ORAL_TABLET | Freq: Four times a day (QID) | ORAL | Status: DC | PRN

## 2012-06-21 MED ORDER — GI COCKTAIL ~~LOC~~
30.0000 mL | Freq: Once | ORAL | Status: AC
  Administered 2012-06-21: 30 mL via ORAL
  Filled 2012-06-21: qty 30

## 2012-06-21 MED ORDER — IOHEXOL 300 MG/ML  SOLN
75.0000 mL | Freq: Once | INTRAMUSCULAR | Status: AC | PRN
  Administered 2012-06-21: 75 mL via INTRAVENOUS

## 2012-06-21 MED ORDER — KETOROLAC TROMETHAMINE 30 MG/ML IJ SOLN
30.0000 mg | Freq: Once | INTRAMUSCULAR | Status: AC
  Administered 2012-06-21: 30 mg via INTRAVENOUS
  Filled 2012-06-21: qty 1

## 2012-06-21 MED ORDER — LORAZEPAM 2 MG/ML IJ SOLN
1.0000 mg | Freq: Once | INTRAMUSCULAR | Status: AC
  Administered 2012-06-21: 1 mg via INTRAVENOUS
  Filled 2012-06-21: qty 1

## 2012-06-21 NOTE — ED Notes (Signed)
Pt states sore throat, ear pain, productive, gray in color. Pt also states a lot of sinus drainage. Symptoms first began Monday.

## 2012-06-21 NOTE — ED Notes (Signed)
Toradol administration checked with J. Idol, EDPa.

## 2012-06-21 NOTE — ED Provider Notes (Signed)
History     CSN: [id number]  Arrival date & time 06/21/12  1220   First MD Initiated Contact with Patient 06/21/12 1352      Chief Complaint  Patient presents with  . Sore Throat  . Cough    (Consider location/radiation/quality/duration/timing/severity/associated sxs/prior treatment) HPI Comments: Rhonda Hickman presents with swelling beneath her chin along with tenderness,  Burning mouth pain,  Especially of her tongue and the sensation of a swollen tongue.  She had a fever 2 days ago, subjective, which has improved.  She describes difficulty swallowing due to pain,  And increased nasal drainage which is described as grey and thick in color .  She denies shortness of breath, wheezing, stridor and chest pain.  She has taken no medications for her symptoms.  She also describes a non productive cough.  The history is provided by the patient.    Past Medical History  Diagnosis Date  . Bipolar 1 disorder   . Diabetes mellitus   . Asthma   . Anxiety     Past Surgical History  Procedure Date  . Abdominal hysterectomy     No family history on file.  History  Substance Use Topics  . Smoking status: Current Every Day Smoker  . Smokeless tobacco: Not on file  . Alcohol Use: No    OB History    Grav Para Term Preterm Abortions TAB SAB Ect Mult Living                  Review of Systems  Constitutional: Positive for fever.  HENT: Positive for congestion, sore throat, rhinorrhea, trouble swallowing, neck pain and voice change. Negative for ear pain, drooling, neck stiffness, dental problem and sinus pressure.   Eyes: Negative.   Respiratory: Negative for chest tightness and shortness of breath.   Cardiovascular: Negative for chest pain.  Gastrointestinal: Negative for nausea, vomiting and abdominal pain.  Genitourinary: Negative.   Musculoskeletal: Negative for joint swelling and arthralgias.  Skin: Negative.  Negative for color change and rash.  Neurological: Negative  for dizziness, weakness, light-headedness, numbness and headaches.  Hematological: Negative.   Psychiatric/Behavioral: Negative.     Allergies  Aspirin and Codeine  Home Medications   Current Outpatient Rx  Name  Route  Sig  Dispense  Refill  . ALBUTEROL SULFATE (2.5 MG/3ML) 0.083% IN NEBU   Nebulization   Take 2.5 mg by nebulization every 6 (six) hours as needed.         Marland Kitchen DIVALPROEX SODIUM ER 500 MG PO TB24   Oral   Take 500-1,000 mg by mouth 2 (two) times daily. Patient takes 1 tablet in the morning and 2 tablets at bedtime         . NOVOLIN L [place]   Subcutaneous   Inject 15 Units into the skin 2 (two) times daily.         Marland Kitchen LISINOPRIL 2.5 MG PO TABS   Oral   Take 2.5 mg by mouth daily.         Marland Kitchen METFORMIN HCL 1000 MG PO TABS   Oral   Take 1,000 mg by mouth 2 (two) times daily with a meal.         . NAPROXEN SODIUM 220 MG PO TABS   Oral   Take 440 mg by mouth 2 (two) times daily with a meal.         . TRAZODONE HCL 100 MG PO TABS   Oral   Take 100 mg  by mouth at bedtime.          Marland Kitchen MAGIC MOUTHWASH W/LIDOCAINE   Oral   Take 10 mLs by mouth 4 (four) times daily as needed (pain).   120 mL   0     Equal parts all ingredients.   . AMOXICILLIN 500 MG PO CAPS   Oral   Take 1 capsule (500 mg total) by mouth 3 (three) times daily.   30 capsule   0   . IBUPROFEN 600 MG PO TABS   Oral   Take 1 tablet (600 mg total) by mouth every 6 (six) hours as needed for pain.   30 tablet   0     BP 137/72  Pulse 91  Temp 97.6 F (36.4 C) (Oral)  Ht 6' (1.829 m)  Wt 270 lb (122.471 kg)  BMI 36.62 kg/m2  SpO2 100%  Physical Exam  Nursing note and vitals reviewed. Constitutional: She appears well-developed and well-nourished.  HENT:  Head: Normocephalic and atraumatic. No trismus in the jaw.  Right Ear: External ear normal.  Left Ear: External ear normal.  Nose: Nose normal. No mucosal edema or rhinorrhea.  Mouth/Throat: Uvula is midline, oropharynx  is clear and moist and mucous membranes are normal. No uvula swelling. No oropharyngeal exudate, posterior oropharyngeal edema, posterior oropharyngeal erythema or tonsillar abscesses.       Tongue, tonsils and posterior pharynx appear normal,  Without edema, erythema or lesions, no swelling.  She is ttp  Subungual space without swelling or induration.  She does appear full bilateral submandible and is ttp without obvious abnormal structures such as indurated lesions,  Fluctuance,  No enlarged nodes present.   Eyes: Conjunctivae normal are normal.  Neck: Normal range of motion. Neck supple. No tracheal deviation, no erythema and normal range of motion present.  Cardiovascular: Normal rate, regular rhythm, normal heart sounds and intact distal pulses.   Pulmonary/Chest: Effort normal and breath sounds normal. No stridor. She has no wheezes. She has no rhonchi. She has no rales.  Abdominal: Soft. Bowel sounds are normal. There is no tenderness.  Musculoskeletal: Normal range of motion.  Neurological: She is alert.  Skin: Skin is warm and dry.  Psychiatric: She has a normal mood and affect.    ED Course  Procedures (including critical care time)  Labs Reviewed  GLUCOSE, CAPILLARY - Abnormal; Notable for the following:    Glucose-Capillary 223 (*)     All other components within normal limits  BASIC METABOLIC PANEL - Abnormal; Notable for the following:    Glucose, Bld 161 (*)     GFR calc non Af Amer 79 (*)     All other components within normal limits  RAPID STREP SCREEN  CBC WITH DIFFERENTIAL   Dg Chest 2 View (if Patient Has Fever And/or Copd)  06/21/2012  *RADIOLOGY REPORT*  Clinical Data: Cough.  CHEST - 2 VIEW  Comparison: February 07, 2012.  Findings: Cardiomediastinal silhouette appears normal.  No acute pulmonary disease is noted.  Bony thorax is intact.  IMPRESSION: No acute cardiopulmonary abnormality seen.   Original Report Authenticated By: Lupita Raider.,  M.D.    Ct Soft  Tissue Neck W Contrast  06/21/2012  *RADIOLOGY REPORT*  Clinical Data: Severe pharyngitis and cough.  CT NECK WITH CONTRAST  Technique:  Multidetector CT imaging of the neck was performed with intravenous contrast.  Contrast: 75mL OMNIPAQUE IOHEXOL 300 MG/ML  SOLN  Comparison: None.  Findings: Normal appearing soft tissues without mass  or abscess. No enlarged lymph nodes.  Normal appearing airway.  Clear lung apices.  Minimal reversal of the normal cervical lordosis and minimal scoliosis.  Mild multilevel degenerative changes in the cervical spine.  IMPRESSION: No acute abnormality.   Original Report Authenticated By: Beckie Salts, M.D.      1. Pharyngitis, acute       MDM  Pt was seen by Dr Estell Harpin who agreed with ct soft tissue neck.  Pt was given IV morphine and she appeared to be comfortable while in dept.  Upon hearing results of normal Ct scan she became increasingly agitated and upset,  Started crying,  Stating she cannot swallow and proceeded to start spitting her saliva into a trash can,  Although was able to swallow (uncomfortably) prior to time of dc.  She was given toradol 30 mg IV for antiinflammatory affect,  Also given ativan 1 mg IV for anxiety.  Gi cocktail gargle for topical relief.    Pt prescribed amoxil, ibuprofen, magic mouthwash with lidocaine.  Encouraged f/u by pcp tomorrow or return here if sx worsen.  Pt stable at time of dc.  She has family member driving her home.          Burgess Amor, [place] 06/21/12 1801

## 2012-06-21 NOTE — ED Notes (Signed)
Pt with no adverse effect from Toradol admin. Pt able to ambulate independently, no itching, no breathing difficulties. Pt reports pain relief and "feeling better."

## 2012-06-22 NOTE — ED Provider Notes (Signed)
Medical screening examination/treatment/procedure(s) were performed by non-physician practitioner and as supervising physician I was immediately available for consultation/collaboration.   Benny Lennert, MD 06/22/12 [phone]

## 2013-01-07 ENCOUNTER — Emergency Department (HOSPITAL_COMMUNITY)
Admission: EM | Admit: 2013-01-07 | Discharge: 2013-01-08 | Disposition: A | Discharge status: Other Acute Inpatient Hospital | Payer: Self-pay | Attending: Emergency Medicine | Admitting: Emergency Medicine

## 2013-01-07 ENCOUNTER — Encounter (HOSPITAL_COMMUNITY): Payer: Self-pay | Admitting: *Deleted

## 2013-01-07 ENCOUNTER — Emergency Department (HOSPITAL_COMMUNITY): Payer: Self-pay

## 2013-01-07 DIAGNOSIS — Z5900 Homelessness unspecified: Secondary | ICD-10-CM | POA: Insufficient documentation

## 2013-01-07 DIAGNOSIS — R443 Hallucinations, unspecified: Secondary | ICD-10-CM | POA: Insufficient documentation

## 2013-01-07 DIAGNOSIS — J45909 Unspecified asthma, uncomplicated: Secondary | ICD-10-CM | POA: Insufficient documentation

## 2013-01-07 DIAGNOSIS — R0789 Other chest pain: Secondary | ICD-10-CM | POA: Insufficient documentation

## 2013-01-07 DIAGNOSIS — R45851 Suicidal ideations: Secondary | ICD-10-CM | POA: Insufficient documentation

## 2013-01-07 DIAGNOSIS — E119 Type 2 diabetes mellitus without complications: Secondary | ICD-10-CM | POA: Insufficient documentation

## 2013-01-07 DIAGNOSIS — F17200 Nicotine dependence, unspecified, uncomplicated: Secondary | ICD-10-CM | POA: Insufficient documentation

## 2013-01-07 DIAGNOSIS — Z79899 Other long term (current) drug therapy: Secondary | ICD-10-CM | POA: Insufficient documentation

## 2013-01-07 DIAGNOSIS — F29 Unspecified psychosis not due to a substance or known physiological condition: Secondary | ICD-10-CM | POA: Insufficient documentation

## 2013-01-07 DIAGNOSIS — F319 Bipolar disorder, unspecified: Secondary | ICD-10-CM | POA: Insufficient documentation

## 2013-01-07 DIAGNOSIS — Z794 Long term (current) use of insulin: Secondary | ICD-10-CM | POA: Insufficient documentation

## 2013-01-07 DIAGNOSIS — F411 Generalized anxiety disorder: Secondary | ICD-10-CM | POA: Insufficient documentation

## 2013-01-07 LAB — CBC WITH DIFFERENTIAL/PLATELET
HCT: 36.7 % (ref 36.0–46.0)
Hemoglobin: 12.5 g/dL (ref 12.0–15.0)
Lymphocytes Relative: 43 % (ref 12–46)
Lymphs Abs: 2.9 10*3/uL (ref 0.7–4.0)
MCHC: 34.1 g/dL (ref 30.0–36.0)
Monocytes Absolute: 0.4 10*3/uL (ref 0.1–1.0)
Monocytes Relative: 6 % (ref 3–12)
Neutro Abs: 3.3 10*3/uL (ref 1.7–7.7)
RBC: 4.33 MIL/uL (ref 3.87–5.11)
WBC: 6.7 10*3/uL (ref 4.0–10.5)

## 2013-01-07 LAB — BASIC METABOLIC PANEL
BUN: 12 mg/dL (ref 6–23)
CO2: 24 mEq/L (ref 19–32)
Chloride: 103 mEq/L (ref 96–112)
Creatinine, Ser: 0.8 mg/dL (ref 0.50–1.10)
Glucose, Bld: 221 mg/dL — ABNORMAL HIGH (ref 70–99)

## 2013-01-07 LAB — RAPID URINE DRUG SCREEN, HOSP PERFORMED
Amphetamines: NOT DETECTED
Barbiturates: NOT DETECTED
Benzodiazepines: NOT DETECTED
Cocaine: NOT DETECTED

## 2013-01-07 LAB — GLUCOSE, CAPILLARY
Glucose-Capillary: 191 mg/dL — ABNORMAL HIGH (ref 70–99)
Glucose-Capillary: 187 mg/dL — ABNORMAL HIGH (ref 70–99)
Glucose-Capillary: 132 mg/dL — ABNORMAL HIGH (ref 70–99)
Glucose-Capillary: 121 mg/dL — ABNORMAL HIGH (ref 70–99)

## 2013-01-07 MED ORDER — LORAZEPAM 1 MG PO TABS: 1.0000 mg | ORAL_TABLET | Freq: Three times a day (TID) | ORAL | Status: DC | PRN

## 2013-01-07 MED ORDER — METFORMIN HCL 500 MG PO TABS
1000.0000 mg | ORAL_TABLET | Freq: Two times a day (BID) | ORAL | Status: DC
  Administered 2013-01-07 – 2013-01-08 (×2): 1000 mg via ORAL
  Filled 2013-01-07 (×2): qty 2

## 2013-01-07 MED ORDER — NICOTINE 21 MG/24HR TD PT24
21.0000 mg | MEDICATED_PATCH | Freq: Every day | TRANSDERMAL | Status: DC
  Filled 2013-01-07: qty 1

## 2013-01-07 MED ORDER — ACETAMINOPHEN 325 MG PO TABS: 650.0000 mg | ORAL_TABLET | ORAL | Status: DC | PRN

## 2013-01-07 MED ORDER — LISINOPRIL 5 MG PO TABS
2.5000 mg | ORAL_TABLET | Freq: Every day | ORAL | Status: DC
  Administered 2013-01-07: 2.5 mg via ORAL
  Filled 2013-01-07: qty 1

## 2013-01-07 MED ORDER — ALBUTEROL SULFATE HFA 108 (90 BASE) MCG/ACT IN AERS
2.0000 | INHALATION_SPRAY | Freq: Four times a day (QID) | RESPIRATORY_TRACT | Status: DC | PRN
Start: 2013-01-07 — End: 2013-01-08
  Administered 2013-01-07: 2 via RESPIRATORY_TRACT
  Filled 2013-01-07: qty 6.7

## 2013-01-07 MED ORDER — ALBUTEROL SULFATE (5 MG/ML) 0.5% IN NEBU: 5.0000 mg | INHALATION_SOLUTION | RESPIRATORY_TRACT | Status: DC | PRN

## 2013-01-07 MED ORDER — ALUM & MAG HYDROXIDE-SIMETH 200-200-20 MG/5ML PO SUSP: 30.0000 mL | ORAL | Status: DC | PRN

## 2013-01-07 MED ORDER — ONDANSETRON HCL 4 MG PO TABS: 4.0000 mg | ORAL_TABLET | Freq: Three times a day (TID) | ORAL | Status: DC | PRN

## 2013-01-07 MED ORDER — METFORMIN HCL 500 MG PO TABS: 1000.0000 mg | ORAL_TABLET | Freq: Two times a day (BID) | ORAL | Status: DC

## 2013-01-07 MED ORDER — LITHIUM CARBONATE 300 MG PO CAPS
300.0000 mg | ORAL_CAPSULE | Freq: Three times a day (TID) | ORAL | Status: DC
  Administered 2013-01-07 – 2013-01-08 (×2): 300 mg via ORAL
  Filled 2013-01-07 (×3): qty 1

## 2013-01-07 MED ORDER — GLIPIZIDE 10 MG PO TABS
20.0000 mg | ORAL_TABLET | Freq: Two times a day (BID) | ORAL | Status: DC
  Administered 2013-01-07 – 2013-01-08 (×2): 20 mg via ORAL
  Filled 2013-01-07 (×4): qty 2

## 2013-01-07 MED ORDER — INSULIN ASPART PROT & ASPART (70-30 MIX) 100 UNIT/ML ~~LOC~~ SUSP
10.0000 [IU] | Freq: Two times a day (BID) | SUBCUTANEOUS | Status: DC
  Administered 2013-01-07 – 2013-01-08 (×2): 10 [IU] via SUBCUTANEOUS
  Filled 2013-01-07: qty 10

## 2013-01-07 MED ORDER — TRAZODONE HCL 150 MG PO TABS
150.0000 mg | ORAL_TABLET | Freq: Every day | ORAL | Status: DC
  Administered 2013-01-07: 150 mg via ORAL
  Filled 2013-01-07 (×2): qty 1

## 2013-01-07 MED ORDER — INSULIN ASPART PROT & ASPART (70-30 MIX) 100 UNIT/ML ~~LOC~~ SUSP
10.0000 [IU] | Freq: Two times a day (BID) | SUBCUTANEOUS | Status: DC
  Filled 2013-01-07: qty 10

## 2013-01-07 MED ORDER — DIVALPROEX SODIUM ER 500 MG PO TB24
1000.0000 mg | ORAL_TABLET | Freq: Two times a day (BID) | ORAL | Status: DC
  Administered 2013-01-07 (×2): 1000 mg via ORAL
  Filled 2013-01-07 (×5): qty 2

## 2013-01-07 MED ORDER — ZOLPIDEM TARTRATE 5 MG PO TABS
10.0000 mg | ORAL_TABLET | Freq: Every evening | ORAL | Status: DC | PRN
  Administered 2013-01-07: 10 mg via ORAL
  Filled 2013-01-07: qty 2
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
## 2013-01-07 NOTE — ED Notes (Signed)
Per [facility]: Pt can be sent over after 7 am to the [facility] A unit with Dr. Forrestine Him accepting.  Report to be called to [phone].
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
## 2013-01-07 NOTE — ED Notes (Signed)
Per ACT pt pending at [facility]
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
## 2013-01-07 NOTE — ED Notes (Signed)
Pt accepted at [facility] but does not have a bed assignment yet but will be transported 9 am tomorrow. [facility] officer aware and Charge RN aware.
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
## 2013-01-07 NOTE — ED Notes (Signed)
Pt sleeping chest rise and fall

## 2013-01-08 NOTE — ED Provider Notes (Signed)
[facility] accepted at [facility] by Dr Forrestine Him.     CBG 150 this am.   Devoria Albe, MD, Franz Dell, MD 01/08/13 [phone]

## 2013-04-10 ENCOUNTER — Encounter (HOSPITAL_COMMUNITY): Payer: Self-pay

## 2013-04-10 ENCOUNTER — Emergency Department (HOSPITAL_COMMUNITY)
Admission: EM | Admit: 2013-04-10 | Discharge: 2013-04-10 | Disposition: A | Discharge status: Home / Self Care | Payer: Self-pay | Attending: Emergency Medicine | Admitting: Emergency Medicine

## 2013-04-10 ENCOUNTER — Emergency Department (HOSPITAL_COMMUNITY): Payer: Self-pay

## 2013-04-10 DIAGNOSIS — E119 Type 2 diabetes mellitus without complications: Secondary | ICD-10-CM | POA: Insufficient documentation

## 2013-04-10 DIAGNOSIS — Y9289 Other specified places as the place of occurrence of the external cause: Secondary | ICD-10-CM | POA: Insufficient documentation

## 2013-04-10 DIAGNOSIS — Z79899 Other long term (current) drug therapy: Secondary | ICD-10-CM | POA: Insufficient documentation

## 2013-04-10 DIAGNOSIS — X500XXA Overexertion from strenuous movement or load, initial encounter: Secondary | ICD-10-CM | POA: Insufficient documentation

## 2013-04-10 DIAGNOSIS — F17200 Nicotine dependence, unspecified, uncomplicated: Secondary | ICD-10-CM | POA: Insufficient documentation

## 2013-04-10 DIAGNOSIS — J45909 Unspecified asthma, uncomplicated: Secondary | ICD-10-CM | POA: Insufficient documentation

## 2013-04-10 DIAGNOSIS — IMO0002 Reserved for concepts with insufficient information to code with codable children: Secondary | ICD-10-CM | POA: Insufficient documentation

## 2013-04-10 DIAGNOSIS — W010XXA Fall on same level from slipping, tripping and stumbling without subsequent striking against object, initial encounter: Secondary | ICD-10-CM | POA: Insufficient documentation

## 2013-04-10 DIAGNOSIS — Z794 Long term (current) use of insulin: Secondary | ICD-10-CM | POA: Insufficient documentation

## 2013-04-10 DIAGNOSIS — F411 Generalized anxiety disorder: Secondary | ICD-10-CM | POA: Insufficient documentation

## 2013-04-10 DIAGNOSIS — Y9389 Activity, other specified: Secondary | ICD-10-CM | POA: Insufficient documentation

## 2013-04-10 DIAGNOSIS — S8392XA Sprain of unspecified site of left knee, initial encounter: Secondary | ICD-10-CM

## 2013-04-10 MED ORDER — IBUPROFEN 600 MG PO TABS
600.0000 mg | ORAL_TABLET | Freq: Four times a day (QID) | ORAL | Status: DC | PRN
Start: 2013-04-10 — End: 2013-04-10

## 2013-04-10 MED ORDER — IBUPROFEN 800 MG PO TABS
800.0000 mg | ORAL_TABLET | Freq: Once | ORAL | Status: DC
  Filled 2013-04-10: qty 1

## 2013-04-10 MED ORDER — TRAMADOL HCL 50 MG PO TABS
50.0000 mg | ORAL_TABLET | Freq: Once | ORAL | Status: AC
  Administered 2013-04-10: 50 mg via ORAL
  Filled 2013-04-10: qty 1

## 2013-04-10 MED ORDER — TRAMADOL HCL 50 MG PO TABS
50.0000 mg | ORAL_TABLET | Freq: Four times a day (QID) | ORAL | Status: DC | PRN
Start: 2013-04-10 — End: 2013-06-03

## 2013-04-10 MED ORDER — KETOROLAC TROMETHAMINE 60 MG/2ML IM SOLN
60.0000 mg | Freq: Once | INTRAMUSCULAR | Status: AC
  Administered 2013-04-10: 60 mg via INTRAMUSCULAR
  Filled 2013-04-10: qty 2

## 2013-04-10 MED ORDER — TRAMADOL HCL 50 MG PO TABS
50.0000 mg | ORAL_TABLET | Freq: Four times a day (QID) | ORAL | Status: DC | PRN
Start: 2013-04-10 — End: 2013-04-10

## 2013-04-10 NOTE — ED Notes (Signed)
Pt reports tripping and falling into a 'baby bed", now having left knee pain, was dropped off by staff from shelter.

## 2013-04-11 NOTE — ED Provider Notes (Signed)
CSN: [id number]     Arrival date & time 04/10/13  1808 History   First MD Initiated Contact with Patient 04/10/13 1936     Chief Complaint  Patient presents with  . Knee Pain   (Consider location/radiation/quality/duration/timing/severity/associated sxs/prior Treatment) HPI Comments: Rhonda Hickman is a 57 y.o. Female presenting with left knee pain and swelling after tripping and landing headfirst into a baby bed, somehow twisting her left knee during the fall which occurred today.  Her pain is constant and sharp, worsened with palpation and weight bearing.  She has taken no medicines prior to arrival.  She was given an ice pack when arriving here which has helped some.  There is no radiation of pain and she denies any other injury from the fall.     The history is provided by the patient.    Past Medical History  Diagnosis Date  . Bipolar 1 disorder   . Diabetes mellitus   . Asthma   . Anxiety    Past Surgical History  Procedure Laterality Date  . Abdominal hysterectomy     No family history on file. History  Substance Use Topics  . Smoking status: Current Every Day Smoker    Types: Cigarettes  . Smokeless tobacco: Not on file  . Alcohol Use: No   OB History   Grav Para Term Preterm Abortions TAB SAB Ect Mult Living                 Review of Systems  Constitutional: Negative.   HENT: Negative for neck pain.   Musculoskeletal: Positive for joint swelling and arthralgias. Negative for myalgias and back pain.  Skin: Negative for wound.  Neurological: Negative for weakness and numbness.    Allergies  Aspirin; Codeine; and Ibuprofen  Home Medications   Current Outpatient Rx  Name  Route  Sig  Dispense  Refill  . albuterol (PROVENTIL HFA;VENTOLIN HFA) 108 (90 BASE) MCG/ACT inhaler   Inhalation   Inhale 2 puffs into the lungs every 6 (six) hours as needed for wheezing.         . divalproex (DEPAKOTE ER) 500 MG 24 hr tablet   Oral   Take 1,000 mg by mouth 2  (two) times daily.          Marland Kitchen glipiZIDE (GLUCOTROL) 10 MG tablet   Oral   Take 20 mg by mouth 2 (two) times daily before a meal.         . insulin NPH-regular (NOVOLIN 70/30) (70-30) 100 UNIT/ML injection   Subcutaneous   Inject 20 Units into the skin 2 (two) times daily with a meal.          . metFORMIN (GLUCOPHAGE) 1000 MG tablet   Oral   Take 1,000 mg by mouth 2 (two) times daily with a meal.          . naproxen sodium (ALEVE) 220 MG tablet   Oral   Take 220-440 mg by mouth 2 (two) times daily as needed (for pain).         . traZODone (DESYREL) 100 MG tablet   Oral   Take 200 mg by mouth at bedtime.         Marland Kitchen albuterol (PROVENTIL) (2.5 MG/3ML) 0.083% nebulizer solution   Nebulization   Take 2.5 mg by nebulization every 6 (six) hours as needed for shortness of breath.          . lisinopril (PRINIVIL,ZESTRIL) 2.5 MG tablet   Oral   Take  2.5 mg by mouth daily.         . traMADol (ULTRAM) 50 MG tablet   Oral   Take 1 tablet (50 mg total) by mouth every 6 (six) hours as needed for pain.   15 tablet   0    BP 108/68  Pulse 88  Temp(Src) 98.2 F (36.8 C) (Oral)  Resp 20  Ht 6' (1.829 m)  Wt 280 lb (127.007 kg)  BMI 37.97 kg/m2  SpO2 99% Physical Exam  Constitutional: She appears well-developed and well-nourished.  HENT:  Head: Atraumatic.  Neck: Normal range of motion.  Cardiovascular:  Pulses equal bilaterally  Musculoskeletal: She exhibits tenderness.       Left knee: She exhibits decreased range of motion and swelling. She exhibits no ecchymosis, no deformity, no laceration, no erythema, no LCL laxity and no MCL laxity. Tenderness found. Medial joint line tenderness noted.  Edema noted along medial left patella.    Neurological: She is alert. She has normal strength. She displays normal reflexes. No sensory deficit.  Equal strength  Skin: Skin is warm and dry.  Psychiatric: She has a normal mood and affect.    ED Course  Procedures  (including critical care time) Labs Review Labs Reviewed - No data to display Imaging Review Dg Knee Complete 4 Views Left  04/10/2013   CLINICAL DATA:  Larey Seat. Injured left knee.  EXAM: LEFT KNEE - COMPLETE 4+ VIEW  COMPARISON:  None.  FINDINGS: Mild tricompartmental degenerative changes with early joint space narrowing and spurring. No acute fracture or osteochondral abnormality. The bony densities near the medial femoral condyle likely reflect a remote MCL injury (Pellegrini-Stieda). 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 No joint effusion.  IMPRESSION: Mild/early degenerative changes but no acute bony findings or joint effusion.  Remote MCL injury.   Electronically Signed   By: Loralie Champagne M.D.   On: 04/10/2013 18:44    MDM   1. Knee sprain and strain, left, initial encounter    Patients labs and/or radiological studies were viewed and considered during the medical decision making and disposition process. Pt placed in knee immobilizer, crutches given.  Prescribed tramadol for pain relief, pt states unable to tolerate nsaids.  RICE, referral to ortho for further management if not improved over the next week.  Pt is currently staying at a local shelter.      Burgess Amor, PA-C 04/11/13 1421

## 2013-04-16 NOTE — ED Provider Notes (Signed)
Medical screening examination/treatment/procedure(s) were performed by non-physician practitioner and as supervising physician I was immediately available for consultation/collaboration. Bella Brummet, MD, FACEP   Siobhan Zaro L Magic Mohler, MD 04/16/13 1101 

## 2013-05-08 ENCOUNTER — Ambulatory Visit: Payer: Self-pay | Admitting: Orthopedic Surgery

## 2013-05-22 ENCOUNTER — Ambulatory Visit (INDEPENDENT_AMBULATORY_CARE_PROVIDER_SITE_OTHER): Payer: Self-pay | Admitting: Orthopedic Surgery

## 2013-05-22 VITALS — BP 117/72 | Ht 72.0 in | Wt 275.0 lb

## 2013-05-22 DIAGNOSIS — M23329 Other meniscus derangements, posterior horn of medial meniscus, unspecified knee: Secondary | ICD-10-CM

## 2013-05-22 DIAGNOSIS — M23322 Other meniscus derangements, posterior horn of medial meniscus, left knee: Secondary | ICD-10-CM

## 2013-05-22 NOTE — Progress Notes (Signed)
Patient ID: Rhonda Hickman, female   DOB: Dec 01, 1955, 57 y.o.   MRN: [id number]  Chief Complaint  Patient presents with  . Knee Pain    Left knee pain. Tripped and fell over 1 month ago.    HISTORY: 57 rolled female fell on October 1 over a bag of luggage injured her left knee. She went to the ER she wore a knee immobilizer and use crutches for several weeks did not improve. She has a Therapist, occupational and we are seeing her for evaluation and treatment  She complains of medial knee pain intermittent and recurrent swelling 9/10 pain which is described as constant, throbbing, stabbing and burning associated with locking and catching and loss of motion.  Review of systems shortness of breath wheezing and cough with tightness of the chest  Heartburn, joint pain swelling stiffness numbness tingling depression although the systems were negative she is allergic to codeine and aspirin.  She has a history of asthma and bipolar disease or diabetes she's had uterine surgery. She takes Depakote glipizide lisinopril metformin insulin family history of asthma and diabetes cancer social history single doesn't work smokes 3 packs of cigarettes per day doesn't drink  BP 117/72  Ht 6' (1.829 m)  Wt 275 lb (124.739 kg)  BMI 37.29 kg/m2 General appearance is normal she has obesity. She is oriented x3 mood is pleasant.  Gait and station are abnormal with a limp  Right left upper extremity normal to inspection. No range of motion deficits. No instability or subluxation. Strength normal. Skin normal. Normal pulses perfusion and temperature without edema.  Right lower extremity inspection was normal palpation was normal range of motion was normal stability tests were normal strength tests were normal skin was normal pulse was normal temperature was normal sensation was normal  Left lower extremity no joint effusion today painful range of motion with loss of extension 5 flexion and point was .120 painful ARC of  motion stability tests were normal strength was normal McMurray sign was positive tenderness along the medial joint line. Skin normal pulse normal lymph nodes negative sensation normal.  Initial film showed no fracture  Impression Encounter Diagnosis  Name Primary?  . Medial meniscus, posterior horn derangement, left Yes    Recommend MRI left knee  I will call her with the results

## 2013-05-22 NOTE — Patient Instructions (Addendum)
MRI ordered  Meniscus Injury, Arthroscopy Arthroscopy is a surgical procedure that involves the use of a small scope that has a camera and surgical instruments on the end (arthroscope). An arthroscope can be used to repair your meniscus injury.  LET [facility] CARE PROVIDER KNOW ABOUT:  Any allergies you have.  All medicines you are taking, including vitamins, herbs, eyedrops, creams, and over-the-counter medicines.  Any recent colds or infections you have had or currently have.  Previous problems you or members of your family have had with the use of anesthetics.  Any blood disorders or blood clotting problems you have.  Previous surgeries you have had.  Medical conditions you have. RISKS AND COMPLICATIONS Generally, this is a safe procedure. However, as with any procedure, complications can occur. Possible complications include:  Damage to nerves or blood vessels.  Excess bleeding.  Blood clots.  Infection. BEFORE THE PROCEDURE  Do not eat or drink for 6 8 hours before the procedure.  Take medicines as directed by your surgeon. Ask your surgeon about changing or stopping your regular medicines.  You may have lab tests the morning of surgery. PROCEDURE  You will be given one of the following:   A medicine that numbs the area (local anesthesia).  A medicine that makes you go to sleep (general anesthesia).  A medicine injected into your spine that numbs your body below the waist (spinal anesthesia). Most often, several small cuts (incisions) are made in the knee. The arthroscope and instruments go into the incisions to repair the damage. The torn portion of the meniscus are removed.  During this time, your surgeon may find a partial or complete tear in a cruciate ligament, such as the anterior cruciate ligament (ACL). A completely torn cruciate ligament is reconstructed by taking tissue from another part of the body (grafting) and placing it into the injured area. This  requires several larger incisions to complete the repair. Sometimes, open surgery is needed for collateral ligament injuries. If a collateral ligament is found to be injured, your surgeon may staple or suture the tear through a slightly larger incision on the side of the knee. AFTER THE PROCEDURE You will be taken to the recovery area where your progress will be monitored. When you are awake, stable, and taking fluids without complications, you will be allowed to go home. This is usually the same day. However, more extensive repairs of a ligament may require an overnight stay.  The recovery time after repairing your meniscus or ligament depends on the amount of damage to these structures. It also depends on whether or not reconstructive knee surgery was needed.   A torn or stretched ligament (ligament sprain) may take 6 8 weeks to heal. It takes about the same amount of time if your surgeon removed a torn meniscus.  A repaired meniscus may require 6 12 weeks of recovery time.  A torn ligament needing reconstructive surgery may take 6 12 months to heal fully. Document Released: 06/24/2000 Document Revised: 02/27/2013 Document Reviewed: 11/23/2012 [facility] Patient Information 2014 [place], [place].

## 2013-05-27 ENCOUNTER — Encounter: Payer: Self-pay | Admitting: Orthopedic Surgery

## 2013-05-29 ENCOUNTER — Ambulatory Visit (HOSPITAL_COMMUNITY)
Admission: RE | Admit: 2013-05-29 | Discharge: 2013-05-29 | Disposition: A | Discharge status: Home / Self Care | Payer: Self-pay | Source: Ambulatory Visit | Attending: Orthopedic Surgery | Admitting: Orthopedic Surgery

## 2013-05-29 ENCOUNTER — Telehealth: Payer: Self-pay | Admitting: Orthopedic Surgery

## 2013-05-29 DIAGNOSIS — M1710 Unilateral primary osteoarthritis, unspecified knee: Secondary | ICD-10-CM | POA: Insufficient documentation

## 2013-05-29 DIAGNOSIS — M25569 Pain in unspecified knee: Secondary | ICD-10-CM | POA: Insufficient documentation

## 2013-05-29 DIAGNOSIS — IMO0002 Reserved for concepts with insufficient information to code with codable children: Secondary | ICD-10-CM | POA: Insufficient documentation

## 2013-05-29 DIAGNOSIS — M6740 Ganglion, unspecified site: Secondary | ICD-10-CM | POA: Insufficient documentation

## 2013-05-29 DIAGNOSIS — M23322 Other meniscus derangements, posterior horn of medial meniscus, left knee: Secondary | ICD-10-CM

## 2013-05-29 DIAGNOSIS — M25469 Effusion, unspecified knee: Secondary | ICD-10-CM | POA: Insufficient documentation

## 2013-05-29 NOTE — Telephone Encounter (Signed)
Patient called to ask about her MRI results.  Best contact phone #[phone].

## 2013-05-30 ENCOUNTER — Other Ambulatory Visit: Payer: Self-pay | Admitting: *Deleted

## 2013-05-30 ENCOUNTER — Telehealth: Payer: Self-pay | Admitting: Orthopedic Surgery

## 2013-05-30 DIAGNOSIS — M1710 Unilateral primary osteoarthritis, unspecified knee: Secondary | ICD-10-CM

## 2013-05-30 MED ORDER — TRAMADOL-ACETAMINOPHEN 37.5-325 MG PO TABS: 1.0000 | ORAL_TABLET | ORAL | Status: DC | PRN

## 2013-05-30 MED ORDER — DICLOFENAC POTASSIUM 50 MG PO TABS: 50.0000 mg | ORAL_TABLET | Freq: Two times a day (BID) | ORAL | Status: DC

## 2013-05-30 NOTE — Telephone Encounter (Signed)
Call patient  She has arthritis of the knee; no tears  Rec: diclofenac 50 mg bid and ultracet for pain and PT 3 x a week x 4 weeks  for 6 weeks  If not better after 6 weeks then call office for re evaluation

## 2013-05-30 NOTE — Telephone Encounter (Signed)
Patient called pharmacy and was told that the Diclofenac 50 mg (potassium)was going to be $116, and the Ultracet $98.70. She said she can not afford that.The pharmacy said if you would be willing to switch her to Diclofenac( Sodium) 75 mg and Tramadol, that they were both on the $4 list. Please Advise.

## 2013-05-30 NOTE — Telephone Encounter (Signed)
Spoke with patient and she wanted to know if the medications were on the $4 plan. I advised her to call the pharmacy.

## 2013-05-30 NOTE — Telephone Encounter (Signed)
change to affordable medicine

## 2013-05-30 NOTE — Telephone Encounter (Signed)
Called patient with results and treatment plan per Dr. Romeo Apple

## 2013-05-30 NOTE — Telephone Encounter (Signed)
Chanon Loney wants to know what medicine was sent to the pharmacy and what they are for. Says there are some medicines she cannot take [phone]

## 2013-06-03 ENCOUNTER — Other Ambulatory Visit: Payer: Self-pay | Admitting: *Deleted

## 2013-06-03 DIAGNOSIS — M1712 Unilateral primary osteoarthritis, left knee: Secondary | ICD-10-CM

## 2013-06-03 MED ORDER — TRAMADOL HCL 50 MG PO TABS: 50.0000 mg | ORAL_TABLET | Freq: Four times a day (QID) | ORAL | Status: DC | PRN

## 2013-06-03 MED ORDER — DICLOFENAC SODIUM 75 MG PO TBEC: 75.0000 mg | DELAYED_RELEASE_TABLET | Freq: Two times a day (BID) | ORAL | Status: DC

## 2013-06-03 NOTE — Telephone Encounter (Signed)
Prescriptions called to [facility] per DR. Romeo Apple

## 2013-06-26 ENCOUNTER — Ambulatory Visit (HOSPITAL_COMMUNITY)
Admission: RE | Admit: 2013-06-26 | Discharge: 2013-06-26 | Disposition: A | Discharge status: Home / Self Care | Payer: Self-pay | Source: Ambulatory Visit | Attending: Orthopedic Surgery | Admitting: Orthopedic Surgery

## 2013-06-26 DIAGNOSIS — R269 Unspecified abnormalities of gait and mobility: Secondary | ICD-10-CM | POA: Insufficient documentation

## 2013-06-26 DIAGNOSIS — IMO0001 Reserved for inherently not codable concepts without codable children: Secondary | ICD-10-CM | POA: Insufficient documentation

## 2013-06-26 DIAGNOSIS — E119 Type 2 diabetes mellitus without complications: Secondary | ICD-10-CM | POA: Insufficient documentation

## 2013-06-26 DIAGNOSIS — M25569 Pain in unspecified knee: Secondary | ICD-10-CM | POA: Insufficient documentation

## 2013-06-26 NOTE — Progress Notes (Signed)
Physical Therapy Evaluation  Patient Details  Name: Rhonda Hickman MRN: [id number] Date of Birth: Jun 25, 1956  Today's Date: 06/26/2013 Time: 1110-1145 PT Time Calculation (min): 35 min Charges: 1 evaluation              Visit#: 1 of 4  Re-eval: 07/26/13 Assessment Diagnosis: Lt knee pain Surgical Date: 04/20/13 Next MD Visit: Dr. Romeo Apple -    Past Medical History:  Past Medical History  Diagnosis Date  . Bipolar 1 disorder   . Diabetes mellitus   . Asthma   . Anxiety    Past Surgical History:  Past Surgical History  Procedure Laterality Date  . Abdominal hysterectomy      Subjective Symptoms/Limitations Symptoms: 57 rolled female fell on October 1 over a bag of luggage injured her left knee. She went to the ER she wore a knee immobilizer and use crutches for several weeks did not improve. She complains of medial knee pain intermittent and recurrent swelling 9/10 pain which is described as constant, throbbing, stabbing and burning associated with locking and catching and loss of motion. She feels when she moves it  Patient Stated Goals: Decrease pain Pain Assessment Currently in Pain?: Yes Pain Score: 7  Pain Location: Knee Pain Relieving Factors: Tramadol  Balance Screening Balance Screen Has the patient fallen in the past 6 months: Yes How many times?: 1 Has the patient had a decrease in activity level because of a fear of falling? : No Is the patient reluctant to leave their home because of a fear of falling? : No  Sensation/Coordination/Flexibility/Functional Tests Functional Tests Functional Tests: FOTO: 28/72  Assessment LLE Assessment LLE Assessment: Exceptions to WFL LLE AROM (degrees) Left Knee Extension: 0 Left Knee Flexion: 85 LLE Strength Left Hip Flexion: 3+/5 Left Hip Extension: 3-/5 Left Hip ABduction: 3-/5 Left Hip ADduction: 3/5 Left Knee Flexion: 3+/5 Left Knee Extension: 3+/5  Mobility/Balance  Ambulation/Gait Ambulation/Gait:  Yes Assistive device: None Gait Pattern: Antalgic   Exercise/Treatments Supine Bridges: 5 reps Straight Leg Raises: 5 reps Sidelying Hip ADduction: 5 reps Prone  Hamstring Curl: 5 reps;5 seconds;Limitations Hamstring Curl Limitations: AAROM Hip Extension: 5 reps  Physical Therapy Assessment and Plan PT Assessment and Plan Clinical Impression Statement: Pt is a 57 year old female referred to PT for Lt knee pain after a fall in October with impairments listed below.  At this time pt is significant limited by her BLE weakness to her leg. Pt will benefit from skilled therapeutic intervention in order to improve on the following deficits: Pain;Decreased strength;Increased fascial restricitons;Increased muscle spasms;Decreased range of motion;Impaired perceived functional ability Rehab Potential: Fair Clinical Impairments Affecting Rehab Potential: secondary to extreme knee pain.  PT Frequency: Min 1X/week PT Duration: 4 weeks PT Treatment/Interventions: Gait training;Stair training;Functional mobility training;Therapeutic activities;Therapeutic exercise;Balance training;Neuromuscular re-education;Patient/family education;Manual techniques PT Plan: Continue to focus on HEP.  Standing knee flexion, squats, heel and toe raises.      Goals   Home Exercise Program Pt/caregiver will Perform Home Exercise Program: For increased strengthening;Independently PT Goal: Perform Home Exercise Program - Progress: Goal set today PT Short Term Goals Time to Complete Short Term Goals: 4 weeks PT Short Term Goal 1: Pt will present with minimal fascial restrictions to knee in order to report pain less than 3/10 for improved QOL.  PT Short Term Goal 2: Pt will improve her FOTO to limiation less than 50% for improved percieved functional ability.  PT Short Term Goal 3: Pt will improve BLE strength to [facility] in  order to perform light activities around her home with minimal difficulty.   Problem List Patient  Active Problem List   Diagnosis Date Noted  . DIABETES MELLITUS, TYPE II 02/16/2007  . ABUSE, COCAINE, UNSPECIFIED 02/16/2007  . ASTHMA, EXTRINSIC NOS 02/16/2007  . HOMELESSNESS, HX OF 02/16/2007    PT Plan of Care PT Home Exercise Plan: given PT Patient Instructions: importance of HEP Consulted and Agree with Plan of Care: Patient  GP    LISA MASSIE, MPT, ATC 06/26/2013, 12:03 PM  Physician Documentation Your signature is required to indicate approval of the treatment plan as stated above.  Please sign and either send electronically or make a copy of this report for your files and return this physician signed original.   Please mark one 1.__approve of plan  2. ___approve of plan with the following conditions.   ______________________________                                                          _____________________ Physician Signature                                                                                                             Date

## 2013-07-02 ENCOUNTER — Ambulatory Visit (HOSPITAL_COMMUNITY): Payer: Self-pay

## 2013-07-08 ENCOUNTER — Ambulatory Visit (HOSPITAL_COMMUNITY)
Admission: RE | Admit: 2013-07-08 | Discharge: 2013-07-08 | Disposition: A | Discharge status: Home / Self Care | Payer: Self-pay | Source: Ambulatory Visit | Attending: Orthopedic Surgery | Admitting: Orthopedic Surgery

## 2013-07-08 ENCOUNTER — Ambulatory Visit (HOSPITAL_COMMUNITY): Payer: Self-pay | Admitting: Physical Therapy

## 2013-07-08 NOTE — Progress Notes (Signed)
Physical Therapy Treatment Patient Details  Name: Rhonda Hickman MRN: [id number] Date of Birth: October 18, 1955  Today's Date: 07/08/2013 Time: 4696-2952 PT Time Calculation (min): 45 min Charge: TE 8413-2440, Manual 1042-1050, Gait 1050-1103  Visit#: 2 of 4  Re-eval: 07/26/13 Assessment Diagnosis: Lt knee pain Surgical Date: 04/20/13 Next MD Visit: Dr. Romeo Apple -   Subjective: Symptoms/Limitations Symptoms: Pt stated Lt knee pain scale 6/10 today, reports compliance with HEP. Pain Assessment Currently in Pain?: Yes Pain Score: 6  Pain Location: Knee Pain Orientation: Left  Objective:   Exercise/Treatments Stretches Active Hamstring Stretch: 3 reps;30 seconds;Limitations Active Hamstring Stretch Limitations: with rope Standing Heel Raises: 10 reps;Limitations Heel Raises Limitations: toe raises 10 Knee Flexion: 10 reps Gait Training: Gait training with verbal and visual cueing with focus on heel to toe pattern and equal stance phase Seated Long Arc Quad: 10 reps Supine Bridges: 10 reps Straight Leg Raises: 10 reps Other Supine Knee Exercises: diaphragmatic breathing x 5 minutes Sidelying Hip ADduction: 10 reps Prone  Hamstring Curl: 10 reps Hip Extension: 10 reps Contract/Relax to Increase Flexion:     Manual Therapy Manual Therapy: Myofascial release Myofascial Release: MFR to anterior knee and hamstrings insertion, patella mobs all directions and tib/fib  Physical Therapy Assessment and Plan PT Assessment and Plan Clinical Impression Statement: Reviewed HEP to assure correct form and technique with exercises.  Pt prone to holding breath with activites, educated pt on importance of breathing with exercises and diagraphmatic breathing complete to reduce shallow breathing.  Pt limited by pain and weakness with all exercises.  Manual techniquies were complete to reduce fascial restrictions around knee, pt reported increased flexibility and decrease pain following  manual.  Gait training complete to improve gait mechanics with multimodal cueing required for heel to toe pattern and equal stance phase.  Pt educated on beneifts of applying ice with elevation to reduce edema and pain.   PT Plan: Continue to focus on HEP.  Continue with standing exercises and gait training.  Begin rockerboard next session.      Goals Home Exercise Program Pt/caregiver will Perform Home Exercise Program: For increased strengthening;Independently PT Short Term Goals Time to Complete Short Term Goals: 4 weeks PT Short Term Goal 1: Pt will present with minimal fascial restrictions to knee in order to report pain less than 3/10 for improved QOL.  PT Short Term Goal 1 - Progress: Progressing toward goal PT Short Term Goal 2: Pt will improve her FOTO to limiation less than 50% for improved percieved functional ability.  PT Short Term Goal 3: Pt will improve BLE strength to [facility] in order to perform light activities around her home with minimal difficulty.  PT Short Term Goal 3 - Progress: Progressing toward goal  Problem List Patient Active Problem List   Diagnosis Date Noted  . DIABETES MELLITUS, TYPE II 02/16/2007  . ABUSE, COCAINE, UNSPECIFIED 02/16/2007  . ASTHMA, EXTRINSIC NOS 02/16/2007  . HOMELESSNESS, HX OF 02/16/2007    PT - End of Session Activity Tolerance: Patient tolerated treatment well;Patient limited by pain General Behavior During Therapy: [facility] for tasks assessed/performed  GP    Juel Burrow 07/08/2013, 2:27 PM

## 2013-07-09 ENCOUNTER — Ambulatory Visit (HOSPITAL_COMMUNITY): Payer: Self-pay

## 2013-07-16 ENCOUNTER — Ambulatory Visit (HOSPITAL_COMMUNITY)
Admission: RE | Admit: 2013-07-16 | Discharge: 2013-07-16 | Disposition: A | Discharge status: Home / Self Care | Payer: Self-pay | Source: Ambulatory Visit | Attending: Orthopedic Surgery | Admitting: Orthopedic Surgery

## 2013-07-16 DIAGNOSIS — E119 Type 2 diabetes mellitus without complications: Secondary | ICD-10-CM | POA: Insufficient documentation

## 2013-07-16 DIAGNOSIS — R269 Unspecified abnormalities of gait and mobility: Secondary | ICD-10-CM | POA: Insufficient documentation

## 2013-07-16 DIAGNOSIS — M25569 Pain in unspecified knee: Secondary | ICD-10-CM | POA: Insufficient documentation

## 2013-07-16 DIAGNOSIS — IMO0001 Reserved for inherently not codable concepts without codable children: Secondary | ICD-10-CM | POA: Insufficient documentation

## 2013-07-16 NOTE — Progress Notes (Signed)
Physical Therapy Treatment Patient Details  Name: Rhonda Hickman MRN: [id number] Date of Birth: 1956/02/26  Today's Date: 07/16/2013 Time: 1107-1200 PT Time Calculation (min): 53 min Charge: Gait 1107-1125, Manual 1125-1142, TE 1142-1200  Visit#: 3 of 4  Re-eval: 07/26/13 Assessment Diagnosis: Lt knee pain Surgical Date: 04/20/13 Next MD Visit: Dr. Aline Brochure -   Subjective: Symptoms/Limitations Symptoms: Pt reported been on feet alot today, increase Lt knee pain 8/10. Pain Assessment Currently in Pain?: Yes Pain Score: 8  Pain Location: Knee Pain Orientation: Left  Objective:   Exercise/Treatments Standing Rocker Board: 2 minutes;Limitations Rocker Board Limitations: R/L Gait Training: Gait training with SPC with cueing for sequencing, heel to toe pattern and equal stride length/stance phase Supine Heel Slides: Left;10 reps Sidelying Hip ADduction: 10 reps Prone  Hamstring Curl: 10 reps Hip Extension: 10 reps   Manual Therapy Manual Therapy: Joint mobilization Joint Mobilization: Instructed patella mobility and tib/fib Myofascial Release: MFR to STM Supine Lt LE from dorsal sufrace of foot, gastrocnemius, anterior tibalis, quads and hamstrings to reduce fascial restrictions and reduce spasms  Physical Therapy Assessment and Plan PT Assessment and Plan Clinical Impression Statement: Pt with antalgic gait mechanics initial treatment, pt instructed gait training with SPC using 3 and 2 step points, with cueing for sequencing with cane to equalize stance phase, heel to toe pattern and equalized stride length..  Began rockerboard to equalize stance phase with improved gait mechanics following.  Manual techniques were complete to reduce fascial restriction and pain. PT Plan: Last treatment next sessoin, assure confidence with HEP.  Review goals,  Continue with standing exercises and gait training.     Goals PT Short Term Goals PT Short Term Goal 1 - Progress: Progressing  toward goal PT Short Term Goal 3 - Progress: Progressing toward goal  Problem List Patient Active Problem List   Diagnosis Date Noted  . DIABETES MELLITUS, TYPE II 02/16/2007  . ABUSE, COCAINE, UNSPECIFIED 02/16/2007  . ASTHMA, EXTRINSIC NOS 02/16/2007  . HOMELESSNESS, HX OF 02/16/2007    PT - End of Session Activity Tolerance: Patient tolerated treatment well;Patient limited by pain General Behavior During Therapy: [facility] for tasks assessed/performed  GP    Aldona Lento 07/16/2013, 12:15 PM

## 2013-07-23 ENCOUNTER — Inpatient Hospital Stay (HOSPITAL_COMMUNITY): Admission: RE | Admit: 2013-07-23 | Payer: Self-pay | Source: Ambulatory Visit

## 2013-07-25 ENCOUNTER — Ambulatory Visit (HOSPITAL_COMMUNITY)
Admission: RE | Admit: 2013-07-25 | Discharge: 2013-07-25 | Disposition: A | Discharge status: Home / Self Care | Payer: Self-pay | Source: Ambulatory Visit | Attending: Orthopedic Surgery | Admitting: Orthopedic Surgery

## 2013-07-25 NOTE — Evaluation (Signed)
Physical Therapy Evaluation  Patient Details  Name: Rhonda Hickman MRN: [id number] Date of Birth: 1955-09-29  Today's Date: 07/25/2013 Time: 5701-7793 PT Time Calculation (min): 28 min Charges: TE: 903-0092 Self Care: 1010-1015             Visit#: 4 of 4  Re-eval: 07/26/13 Assessment Diagnosis: Lt knee pain Surgical Date: 04/20/13 Next MD Visit: Dr. Aline Brochure -   Subjective Symptoms/Limitations Symptoms: "I doing a few exercises at home" Pain Assessment Currently in Pain?: Yes Pain Score: 8  Pain Location: Knee Pain Orientation: Left  Sensation/Coordination/Flexibility/Functional Tests Functional Tests Functional Tests: FOTO: 26/74 (was 28/72)  Assessment LLE Strength Left Hip Flexion: 3+/5 Left Hip Extension: 3-/5 Left Hip ABduction: 3-/5 Left Hip ADduction: 3/5 Left Knee Flexion: 3+/5 Left Knee Extension: 3+/5  Exercise/Treatments  Standing Knee Flexion: Left;2 sets;15 reps Functional Squat: 15 reps Seated Long Arc Quad: Left;2 sets;15 reps Other Seated Knee Exercises: Hamstring curls: red theraband x15 reps Other Seated Knee Exercises: Hip abduction x20 reps red theraband   Physical Therapy Assessment and Plan PT Assessment and Plan Clinical Impression Statement: Ms. Abend attended 4 OP PT visits in 4 weeks to address Lt knee pain with following findings: pt has improved gait mechanics, continues to demonstrate significant weakness to her LLE and is guard with sit to stand to sit mobility.  Pt is able to complete all activities independently and reports she is doing some of her exercises.  Continues to have pain to her Lt quadricep muscle and has most pain to quadricep during knee flexion resistance activities.  At this time utlized teach back for patient on importance of HEP and strengthening LE to decrease secondary impairments. Will d/c at this time.  PT Plan: D/c    Goals Home Exercise Program Pt/caregiver will Perform Home Exercise Program: For  increased strengthening;Independently PT Goal: Perform Home Exercise Program - Progress: Progressing toward goal PT Short Term Goals Time to Complete Short Term Goals: 4 weeks PT Short Term Goal 1: Pt will present with minimal fascial restrictions to knee in order to report pain less than 3/10 for improved QOL.  PT Short Term Goal 1 - Progress: Partly met PT Short Term Goal 2: Pt will improve her FOTO to limiation less than 50% for improved percieved functional ability.  PT Short Term Goal 2 - Progress: Not met PT Short Term Goal 3: Pt will improve BLE strength to [facility] in order to perform light activities around her home with minimal difficulty.  PT Short Term Goal 3 - Progress: Progressing toward goal  Problem List Patient Active Problem List   Diagnosis Date Noted  . DIABETES MELLITUS, TYPE II 02/16/2007  . ABUSE, COCAINE, UNSPECIFIED 02/16/2007  . ASTHMA, EXTRINSIC NOS 02/16/2007  . HOMELESSNESS, HX OF 02/16/2007    PT - End of Session Activity Tolerance: Patient tolerated treatment well;Patient limited by pain General Behavior During Therapy: [facility] for tasks assessed/performed PT Plan of Care PT Home Exercise Plan: provided red theraband for home Consulted and Agree with Plan of Care: Patient  GP    LISA MASSIE, MPT, ATC  07/25/2013, 10:57 AM  Physician Documentation Your signature is required to indicate approval of the treatment plan as stated above.  Please sign and either send electronically or make a copy of this report for your files and return this physician signed original.   Please mark one 1.__approve of plan  2. ___approve of plan with the following conditions.   ______________________________  _____________________ Physician Signature                                                                                                             Date

## 2013-07-29 ENCOUNTER — Telehealth: Payer: Self-pay | Admitting: Orthopedic Surgery

## 2013-07-29 NOTE — Telephone Encounter (Signed)
Patient called to relay that she has just returned from travel to [place], and had left her bag of medications there;  She is requesting another prescription for medication:  traMADol (ULTRAM) 50 MG tablet / pharmacy is [facility] in [place].  Her phone # is [phone].  Please advise if possible to order the medicine.

## 2013-07-29 NOTE — Telephone Encounter (Signed)
Pharmacy will not give her a refill unless you give the ok to over ride due to she just got it filled at the end of December. Please advise.

## 2013-07-30 NOTE — Telephone Encounter (Signed)
Called [facility] in [place] to override per Dr. Aline Brochure, but was advised by the pharmacist that they had just filled a prescription for hydrocodone from a Dr.Eugene Rosana Hoes. Per Dr. Aline Brochure I canceled the override and advised the patient.

## 2013-07-30 NOTE — Telephone Encounter (Signed)
Ok to overide  

## 2013-08-01 NOTE — Telephone Encounter (Signed)
08/01/13 - Patient has called back regarding the prescription request -- states does not wish to get the medication that was ordered by Dr Aline Brochure at [facility] in [place], as "it is too strong, and it has codeine in it" which she said she does not want - she is asking if she can have her same Tramadol ?  Her ph# is [phone].

## 2013-08-01 NOTE — Telephone Encounter (Signed)
Spoke with patient and advised that she would have to wait until time to refill tramadol.

## 2013-08-27 ENCOUNTER — Telehealth: Payer: Self-pay | Admitting: Orthopedic Surgery

## 2013-08-28 ENCOUNTER — Other Ambulatory Visit: Payer: Self-pay | Admitting: *Deleted

## 2013-08-28 DIAGNOSIS — M1712 Unilateral primary osteoarthritis, left knee: Secondary | ICD-10-CM

## 2013-08-28 MED ORDER — TRAMADOL HCL 50 MG PO TABS: 50.0000 mg | ORAL_TABLET | Freq: Four times a day (QID) | ORAL | Status: DC | PRN

## 2013-08-28 NOTE — Telephone Encounter (Signed)
Routing to Dr Harrison 

## 2013-08-28 NOTE — Telephone Encounter (Signed)
refill 

## 2013-08-28 NOTE — Telephone Encounter (Signed)
Prescription sent in to [facility] per Dr. Aline Brochure. Patient aware.

## 2013-09-01 ENCOUNTER — Emergency Department (HOSPITAL_COMMUNITY): Payer: Self-pay

## 2013-09-01 ENCOUNTER — Emergency Department (HOSPITAL_COMMUNITY)
Admission: EM | Admit: 2013-09-01 | Discharge: 2013-09-01 | Disposition: A | Discharge status: Home / Self Care | Payer: Self-pay | Attending: Emergency Medicine | Admitting: Emergency Medicine

## 2013-09-01 ENCOUNTER — Encounter (HOSPITAL_COMMUNITY): Payer: Self-pay | Admitting: Emergency Medicine

## 2013-09-01 DIAGNOSIS — E119 Type 2 diabetes mellitus without complications: Secondary | ICD-10-CM | POA: Insufficient documentation

## 2013-09-01 DIAGNOSIS — Z794 Long term (current) use of insulin: Secondary | ICD-10-CM | POA: Insufficient documentation

## 2013-09-01 DIAGNOSIS — Z792 Long term (current) use of antibiotics: Secondary | ICD-10-CM | POA: Insufficient documentation

## 2013-09-01 DIAGNOSIS — Z90710 Acquired absence of both cervix and uterus: Secondary | ICD-10-CM | POA: Insufficient documentation

## 2013-09-01 DIAGNOSIS — F17200 Nicotine dependence, unspecified, uncomplicated: Secondary | ICD-10-CM | POA: Insufficient documentation

## 2013-09-01 DIAGNOSIS — Z79899 Other long term (current) drug therapy: Secondary | ICD-10-CM | POA: Insufficient documentation

## 2013-09-01 DIAGNOSIS — F411 Generalized anxiety disorder: Secondary | ICD-10-CM | POA: Insufficient documentation

## 2013-09-01 DIAGNOSIS — J45909 Unspecified asthma, uncomplicated: Secondary | ICD-10-CM | POA: Insufficient documentation

## 2013-09-01 DIAGNOSIS — F319 Bipolar disorder, unspecified: Secondary | ICD-10-CM | POA: Insufficient documentation

## 2013-09-01 DIAGNOSIS — Z791 Long term (current) use of non-steroidal anti-inflammatories (NSAID): Secondary | ICD-10-CM | POA: Insufficient documentation

## 2013-09-01 DIAGNOSIS — N12 Tubulo-interstitial nephritis, not specified as acute or chronic: Secondary | ICD-10-CM | POA: Insufficient documentation

## 2013-09-01 LAB — URINALYSIS, ROUTINE W REFLEX MICROSCOPIC
BILIRUBIN URINE: NEGATIVE
Glucose, UA: 500 mg/dL — AB
KETONES UR: NEGATIVE mg/dL
Leukocytes, UA: NEGATIVE
NITRITE: NEGATIVE
UROBILINOGEN UA: 0.2 mg/dL (ref 0.0–1.0)
pH: 6 (ref 5.0–8.0)

## 2013-09-01 LAB — CBC
HCT: 40.4 % (ref 36.0–46.0)
HEMOGLOBIN: 13.6 g/dL (ref 12.0–15.0)
MCH: 28.5 pg (ref 26.0–34.0)
MCHC: 33.7 g/dL (ref 30.0–36.0)
MCV: 84.5 fL (ref 78.0–100.0)
PLATELETS: 265 10*3/uL (ref 150–400)
RBC: 4.78 MIL/uL (ref 3.87–5.11)
RDW: 14 % (ref 11.5–15.5)
WBC: 7.9 10*3/uL (ref 4.0–10.5)

## 2013-09-01 LAB — URINE MICROSCOPIC-ADD ON

## 2013-09-01 LAB — BASIC METABOLIC PANEL
BUN: 10 mg/dL (ref 6–23)
CALCIUM: 9.2 mg/dL (ref 8.4–10.5)
CO2: 26 meq/L (ref 19–32)
CREATININE: 0.75 mg/dL (ref 0.50–1.10)
Chloride: 98 mEq/L (ref 96–112)
GFR calc Af Amer: >90 mL/min (ref >90)
GLUCOSE: 303 mg/dL — AB (ref 70–99)
Potassium: 4.3 mEq/L (ref 3.7–5.3)
Sodium: 137 mEq/L (ref 137–147)

## 2013-09-01 MED ORDER — ONDANSETRON HCL 4 MG/2ML IJ SOLN
4.0000 mg | Freq: Once | INTRAMUSCULAR | Status: AC
  Administered 2013-09-01: 4 mg via INTRAVENOUS
  Filled 2013-09-01: qty 2

## 2013-09-01 MED ORDER — HYDROMORPHONE HCL PF 1 MG/ML IJ SOLN
1.0000 mg | Freq: Once | INTRAMUSCULAR | Status: AC
  Administered 2013-09-01: 1 mg via INTRAVENOUS
  Filled 2013-09-01: qty 1

## 2013-09-01 MED ORDER — SODIUM CHLORIDE 0.9 % IV SOLN
1000.0000 mL | Freq: Once | INTRAVENOUS | Status: AC
  Administered 2013-09-01: 1000 mL via INTRAVENOUS

## 2013-09-01 MED ORDER — SODIUM CHLORIDE 0.9 % IV SOLN: 1000.0000 mL | INTRAVENOUS | Status: DC

## 2013-09-01 MED ORDER — OXYCODONE-ACETAMINOPHEN 5-325 MG PO TABS: 1.0000 | ORAL_TABLET | ORAL | Status: DC | PRN

## 2013-09-01 MED ORDER — CEPHALEXIN 500 MG PO CAPS
500.0000 mg | ORAL_CAPSULE | Freq: Once | ORAL | Status: AC
  Administered 2013-09-01: 500 mg via ORAL
  Filled 2013-09-01: qty 1

## 2013-09-01 MED ORDER — PROMETHAZINE HCL 25 MG PO TABS: 25.0000 mg | ORAL_TABLET | Freq: Four times a day (QID) | ORAL | Status: DC | PRN

## 2013-09-01 MED ORDER — CEPHALEXIN 500 MG PO CAPS: 500.0000 mg | ORAL_CAPSULE | Freq: Four times a day (QID) | ORAL | Status: DC

## 2013-09-01 NOTE — ED Notes (Signed)
Patient with no complaints at this time. Respirations even and unlabored. Skin warm/dry. Discharge instructions reviewed with patient at this time. Patient given opportunity to voice concerns/ask questions. IV removed per policy and band-aid applied to site. Patient discharged at this time and left Emergency Department with steady gait.  

## 2013-09-01 NOTE — ED Provider Notes (Signed)
CSN: [id number]     Arrival date & time 09/01/13  0109 History   First MD Initiated Contact with Patient 09/01/13 0116        The history is provided by the patient.   Patient reports severe left flank pain over the past several days.  It seems to be worsening.  She had associated nausea and vomiting.  She reports the abdominal pain radiates around to her left abdomen and left groin.  No prior history kidney stones.  She's had back pain before but nothing like this.  No recent heavy lifting.  She does report to nursing staff that there is some radiation down her left leg but the majority of her radiation is more on her left abdomen.  Her pain is localized to her left flank region.  She denies right flank pain.  She does have urinary frequency.  She denies dysuria.  No vaginal complaints.  No diarrhea.  No hematemesis.  Pain is moderate to severe in severity at this time   Past Medical History  Diagnosis Date  . Bipolar 1 disorder   . Diabetes mellitus   . Asthma   . Anxiety    Past Surgical History  Procedure Laterality Date  . Abdominal hysterectomy     No family history on file. History  Substance Use Topics  . Smoking status: Current Every Day Smoker    Types: Cigarettes  . Smokeless tobacco: Not on file  . Alcohol Use: No   OB History   Grav Para Term Preterm Abortions TAB SAB Ect Mult Living                 Review of Systems  All other systems reviewed and are negative.      Allergies  Aspirin; Codeine; and Ibuprofen  Home Medications   Current Outpatient Rx  Name  Route  Sig  Dispense  Refill  . albuterol (PROVENTIL HFA;VENTOLIN HFA) 108 (90 BASE) MCG/ACT inhaler   Inhalation   Inhale 2 puffs into the lungs every 6 (six) hours as needed for wheezing.         Marland Kitchen albuterol (PROVENTIL) (2.5 MG/3ML) 0.083% nebulizer solution   Nebulization   Take 2.5 mg by nebulization every 6 (six) hours as needed for shortness of breath.          . diclofenac (CATAFLAM)  50 MG tablet   Oral   Take 1 tablet (50 mg total) by mouth 2 (two) times daily.   90 tablet   3   . diclofenac (VOLTAREN) 75 MG EC tablet   Oral   Take 1 tablet (75 mg total) by mouth 2 (two) times daily with a meal.   60 tablet   5   . divalproex (DEPAKOTE ER) 500 MG 24 hr tablet   Oral   Take 1,000 mg by mouth 2 (two) times daily.          Marland Kitchen glipiZIDE (GLUCOTROL) 10 MG tablet   Oral   Take 20 mg by mouth 2 (two) times daily before a meal.         . insulin NPH-regular (NOVOLIN 70/30) (70-30) 100 UNIT/ML injection   Subcutaneous   Inject 20 Units into the skin 2 (two) times daily with a meal.          . lisinopril (PRINIVIL,ZESTRIL) 2.5 MG tablet   Oral   Take 2.5 mg by mouth daily.         . metFORMIN (GLUCOPHAGE) 1000 MG tablet  Oral   Take 1,000 mg by mouth 2 (two) times daily with a meal.          . naproxen sodium (ALEVE) 220 MG tablet   Oral   Take 220-440 mg by mouth 2 (two) times daily as needed (for pain).         . traMADol (ULTRAM) 50 MG tablet   Oral   Take 1 tablet (50 mg total) by mouth every 6 (six) hours as needed.   90 tablet   0   . traMADol-acetaminophen (ULTRACET) 37.5-325 MG per tablet   Oral   Take 1 tablet by mouth every 4 (four) hours as needed.   90 tablet   5   . traZODone (DESYREL) 100 MG tablet   Oral   Take 200 mg by mouth at bedtime.         . cephALEXin (KEFLEX) 500 MG capsule   Oral   Take 1 capsule (500 mg total) by mouth 4 (four) times daily.   28 capsule   0   . oxyCODONE-acetaminophen (PERCOCET/ROXICET) 5-325 MG per tablet   Oral   Take 1 tablet by mouth every 4 (four) hours as needed for severe pain.   15 tablet   0   . promethazine (PHENERGAN) 25 MG tablet   Oral   Take 1 tablet (25 mg total) by mouth every 6 (six) hours as needed for nausea or vomiting.   15 tablet   0    BP 129/74  Pulse 87  Temp(Src) 98.7 F (37.1 C) (Oral)  Resp 18  Ht 6' (1.829 m)  Wt 275 lb (124.739 kg)  BMI 37.29  kg/m2  SpO2 94% Physical Exam  Nursing note and vitals reviewed. Constitutional: She is oriented to person, place, and time. She appears well-developed and well-nourished. No distress.  HENT:  Head: Normocephalic and atraumatic.  Eyes: EOM are normal.  Neck: Normal range of motion.  Cardiovascular: Normal rate, regular rhythm and normal heart sounds.   Pulmonary/Chest: Effort normal and breath sounds normal.  Abdominal: Soft. She exhibits no distension.  Genitourinary:  Mild left CVA tenderness  Musculoskeletal: Normal range of motion.  Neurological: She is alert and oriented to person, place, and time.  Skin: Skin is warm and dry. No rash noted.  Psychiatric: She has a normal mood and affect. Judgment normal.    ED Course  Procedures (including critical care time) Labs Review Labs Reviewed  URINALYSIS, ROUTINE W REFLEX MICROSCOPIC - Abnormal; Notable for the following:    Specific Gravity, Urine >1.030 (*)    Glucose, UA 500 (*)    Hgb urine dipstick SMALL (*)    Protein, ur TRACE (*)    All other components within normal limits  BASIC METABOLIC PANEL - Abnormal; Notable for the following:    Glucose, Bld 303 (*)    All other components within normal limits  URINE MICROSCOPIC-ADD ON - Abnormal; Notable for the following:    Squamous Epithelial / LPF FEW (*)    Bacteria, UA FEW (*)    All other components within normal limits  CBC   Imaging Review Ct Abdomen Pelvis Wo Contrast  09/01/2013   CLINICAL DATA:  Diabetes.  Flank pain.  EXAM: CT ABDOMEN AND PELVIS WITHOUT CONTRAST  TECHNIQUE: Multidetector CT imaging of the abdomen and pelvis was performed following the standard protocol without intravenous contrast.  COMPARISON:  None.  FINDINGS: Liver normal. Spleen normal. Pancreas normal. Cholecystectomy. No significant biliary distention.  Adrenals normal. No  focal renal abnormality. No hydronephrosis. No obstructing ureteral stone. No bladder distention. Phleboliths in pelvis.  Hysterectomy. No pelvic masses. No free pelvic fluid.  Multiple small bilateral inguinal lymph nodes are present. Shotty retroperitoneal lymph nodes are present. Abdominal aorta normal caliber.  Appendix normal. No inflammatory changes in the right or left lower quadrant. No bowel obstruction. No free air. No mesenteric masses.  Lung bases are clear. Heart size normal. Abdominal wall is intact. No acute bony abnormality. Degenerative changes lumbar spine. Fragmentation noted at the ischial tuberosities bilaterally consistent with old injuries.  IMPRESSION: No acute abnormality.   Electronically Signed   By: Marcello Moores  Register   On: 09/01/2013 02:24  I personally reviewed the imaging tests through PACS system I reviewed available ER/hospitalization records through the EMR   EKG Interpretation   None       MDM   Final diagnoses:  Pyelonephritis    Patient feels much better after pain medication.  Patient with 11-20 whites and bacteria present.  This may represent pyelonephritis versus musculoskeletal back pain.  Will treat this as pyelonephritis.Marland Kitchen    Hoy Morn, MD 09/01/13 [phone]

## 2013-09-01 NOTE — ED Notes (Signed)
Pain in left lower back with radiation down left leg x 1 week.

## 2013-09-01 NOTE — Discharge Instructions (Signed)

## 2013-09-12 ENCOUNTER — Emergency Department (HOSPITAL_COMMUNITY): Payer: Self-pay

## 2013-09-12 ENCOUNTER — Emergency Department (HOSPITAL_COMMUNITY)
Admission: EM | Admit: 2013-09-12 | Discharge: 2013-09-12 | Disposition: A | Discharge status: Home / Self Care | Payer: Self-pay | Attending: Emergency Medicine | Admitting: Emergency Medicine

## 2013-09-12 ENCOUNTER — Encounter (HOSPITAL_COMMUNITY): Payer: Self-pay | Admitting: Emergency Medicine

## 2013-09-12 DIAGNOSIS — Z792 Long term (current) use of antibiotics: Secondary | ICD-10-CM | POA: Insufficient documentation

## 2013-09-12 DIAGNOSIS — Z791 Long term (current) use of non-steroidal anti-inflammatories (NSAID): Secondary | ICD-10-CM | POA: Insufficient documentation

## 2013-09-12 DIAGNOSIS — J45909 Unspecified asthma, uncomplicated: Secondary | ICD-10-CM | POA: Insufficient documentation

## 2013-09-12 DIAGNOSIS — Z79899 Other long term (current) drug therapy: Secondary | ICD-10-CM | POA: Insufficient documentation

## 2013-09-12 DIAGNOSIS — E119 Type 2 diabetes mellitus without complications: Secondary | ICD-10-CM | POA: Insufficient documentation

## 2013-09-12 DIAGNOSIS — F17200 Nicotine dependence, unspecified, uncomplicated: Secondary | ICD-10-CM | POA: Insufficient documentation

## 2013-09-12 DIAGNOSIS — F319 Bipolar disorder, unspecified: Secondary | ICD-10-CM | POA: Insufficient documentation

## 2013-09-12 DIAGNOSIS — Z794 Long term (current) use of insulin: Secondary | ICD-10-CM | POA: Insufficient documentation

## 2013-09-12 DIAGNOSIS — N390 Urinary tract infection, site not specified: Secondary | ICD-10-CM | POA: Insufficient documentation

## 2013-09-12 LAB — URINALYSIS, ROUTINE W REFLEX MICROSCOPIC
Bilirubin Urine: NEGATIVE
Glucose, UA: >1000 mg/dL — AB
HGB URINE DIPSTICK: NEGATIVE
Ketones, ur: NEGATIVE mg/dL
Leukocytes, UA: NEGATIVE
Nitrite: NEGATIVE
PROTEIN: NEGATIVE mg/dL
Urobilinogen, UA: 0.2 mg/dL (ref 0.0–1.0)
pH: 5 (ref 5.0–8.0)

## 2013-09-12 LAB — CBC WITH DIFFERENTIAL/PLATELET
BASOS ABS: 0.1 10*3/uL (ref 0.0–0.1)
BASOS PCT: 1 % (ref 0–1)
EOS ABS: 0.1 10*3/uL (ref 0.0–0.7)
Eosinophils Relative: 2 % (ref 0–5)
HEMATOCRIT: 42.3 % (ref 36.0–46.0)
Hemoglobin: 14.2 g/dL (ref 12.0–15.0)
Lymphocytes Relative: 46 % (ref 12–46)
Lymphs Abs: 3.9 10*3/uL (ref 0.7–4.0)
MCH: 28.1 pg (ref 26.0–34.0)
MCHC: 33.6 g/dL (ref 30.0–36.0)
MCV: 83.6 fL (ref 78.0–100.0)
MONO ABS: 0.5 10*3/uL (ref 0.1–1.0)
Monocytes Relative: 6 % (ref 3–12)
NEUTROS ABS: 3.9 10*3/uL (ref 1.7–7.7)
NEUTROS PCT: 46 % (ref 43–77)
Platelets: 301 10*3/uL (ref 150–400)
RBC: 5.06 MIL/uL (ref 3.87–5.11)
RDW: 14 % (ref 11.5–15.5)
WBC: 8.4 10*3/uL (ref 4.0–10.5)

## 2013-09-12 LAB — COMPREHENSIVE METABOLIC PANEL
ALBUMIN: 3.8 g/dL (ref 3.5–5.2)
ALT: 15 U/L (ref 0–35)
AST: 19 U/L (ref 0–37)
Alkaline Phosphatase: 94 U/L (ref 39–117)
BILIRUBIN TOTAL: 0.2 mg/dL — AB (ref 0.3–1.2)
BUN: 9 mg/dL (ref 6–23)
CHLORIDE: 98 meq/L (ref 96–112)
CO2: 23 mEq/L (ref 19–32)
Calcium: 9.8 mg/dL (ref 8.4–10.5)
Creatinine, Ser: 0.77 mg/dL (ref 0.50–1.10)
GFR calc Af Amer: >90 mL/min (ref >90)
GFR calc non Af Amer: >90 mL/min (ref >90)
Glucose, Bld: 333 mg/dL — ABNORMAL HIGH (ref 70–99)
Potassium: 4.4 mEq/L (ref 3.7–5.3)
Sodium: 136 mEq/L — ABNORMAL LOW (ref 137–147)
Total Protein: 8.6 g/dL — ABNORMAL HIGH (ref 6.0–8.3)

## 2013-09-12 LAB — URINE MICROSCOPIC-ADD ON

## 2013-09-12 MED ORDER — LORAZEPAM 2 MG/ML IJ SOLN
0.5000 mg | Freq: Once | INTRAMUSCULAR | Status: AC
  Administered 2013-09-12: 0.5 mg via INTRAVENOUS
  Filled 2013-09-12: qty 1

## 2013-09-12 MED ORDER — CIPROFLOXACIN HCL 500 MG PO TABS: 500.0000 mg | ORAL_TABLET | Freq: Two times a day (BID) | ORAL | Status: DC

## 2013-09-12 MED ORDER — ONDANSETRON HCL 4 MG/2ML IJ SOLN
4.0000 mg | Freq: Once | INTRAMUSCULAR | Status: AC
  Administered 2013-09-12: 4 mg via INTRAVENOUS
  Filled 2013-09-12: qty 2

## 2013-09-12 MED ORDER — HYDROCODONE-ACETAMINOPHEN 5-325 MG PO TABS: 1.0000 | ORAL_TABLET | Freq: Four times a day (QID) | ORAL | Status: DC | PRN

## 2013-09-12 MED ORDER — HYDROMORPHONE HCL PF 1 MG/ML IJ SOLN
1.0000 mg | Freq: Once | INTRAMUSCULAR | Status: AC
  Administered 2013-09-12: 1 mg via INTRAVENOUS
  Filled 2013-09-12: qty 1

## 2013-09-12 NOTE — ED Provider Notes (Signed)
CSN: [id number]     Arrival date & time 09/12/13  1453 History  This chart was scribed for Maudry Diego, MD,  by Stacy Gardner, ED Scribe and Jenne Campus, ED Scribe. The patient was seen in room APA18/APA18 and the patient's care was started at 3:14 PM.   First MD Initiated Contact with Patient 09/12/13 1507     Chief Complaint  Patient presents with  . Back Pain    Patient is a 58 y.o. female presenting with back pain. The history is provided by the patient. No language interpreter was used.  Back Pain Location:  Lumbar spine Quality:  Burning Radiates to: abdomen. Duration:  1 day Chronicity:  Recurrent Context comment:  "pyelonephritis" per pt Relieved by:  Nothing Associated symptoms: abdominal pain   Associated symptoms: no chest pain and no headaches    HPI Comments: Rhonda Hickman is a 58 y.o. female who presents to the Emergency Department complaining of constant, severe left lumbar back pain that radiates to her lower abdomen with onset of today. Pt was recently seen in the ED to be treated for pyelonephritis. She was started on Keflex during her last visit for the same 08/2213. She states that she is still on this medicationHer symptoms are similar in nature and severity. Nothing seems to resolve the pain. She has tramadol at home but denies that it improves her pain.   Past Medical History  Diagnosis Date  . Bipolar 1 disorder   . Diabetes mellitus   . Asthma   . Anxiety    Past Surgical History  Procedure Laterality Date  . Abdominal hysterectomy     No family history on file. History  Substance Use Topics  . Smoking status: Current Every Day Smoker    Types: Cigarettes  . Smokeless tobacco: Not on file  . Alcohol Use: No   No OB history provided.  Review of Systems  Constitutional: Negative for appetite change and fatigue.  HENT: Negative for congestion, ear discharge and sinus pressure.   Eyes: Negative for discharge.  Respiratory: Negative for  cough.   Cardiovascular: Negative for chest pain.  Gastrointestinal: Positive for abdominal pain. Negative for diarrhea.  Genitourinary: Positive for flank pain. Negative for frequency and hematuria.  Musculoskeletal: Positive for back pain.  Skin: Negative for rash.  Neurological: Negative for seizures and headaches.  Psychiatric/Behavioral: Negative for hallucinations.  All other systems reviewed and are negative.    Allergies  Aspirin; Codeine; and Ibuprofen  Home Medications   Current Outpatient Rx  Name  Route  Sig  Dispense  Refill  . albuterol (PROVENTIL) (2.5 MG/3ML) 0.083% nebulizer solution   Nebulization   Take 2.5 mg by nebulization every 6 (six) hours as needed for shortness of breath.          . cephALEXin (KEFLEX) 500 MG capsule   Oral   Take 1 capsule (500 mg total) by mouth 4 (four) times daily.   28 capsule   0   . divalproex (DEPAKOTE ER) 500 MG 24 hr tablet   Oral   Take 1,000 mg by mouth 2 (two) times daily.          Marland Kitchen glipiZIDE (GLUCOTROL) 10 MG tablet   Oral   Take 20 mg by mouth 2 (two) times daily before a meal.         . insulin NPH-regular (NOVOLIN 70/30) (70-30) 100 UNIT/ML injection   Subcutaneous   Inject 20 Units into the skin 2 (two) times daily  with a meal.          . lisinopril (PRINIVIL,ZESTRIL) 2.5 MG tablet   Oral   Take 2.5 mg by mouth daily.         . metFORMIN (GLUCOPHAGE) 1000 MG tablet   Oral   Take 1,000 mg by mouth 2 (two) times daily with a meal.          . promethazine (PHENERGAN) 25 MG tablet   Oral   Take 1 tablet (25 mg total) by mouth every 6 (six) hours as needed for nausea or vomiting.   15 tablet   0   . traMADol (ULTRAM) 50 MG tablet   Oral   Take 50 mg by mouth every 6 (six) hours as needed. pain         . traZODone (DESYREL) 100 MG tablet   Oral   Take 200 mg by mouth at bedtime.         Marland Kitchen albuterol (PROVENTIL HFA;VENTOLIN HFA) 108 (90 BASE) MCG/ACT inhaler   Inhalation   Inhale 2  puffs into the lungs every 6 (six) hours as needed for wheezing.         . naproxen sodium (ALEVE) 220 MG tablet   Oral   Take 220-440 mg by mouth 2 (two) times daily as needed (for pain).          BP 128/96  Pulse 95  Temp(Src) 97.5 F (36.4 C) (Oral)  Resp 20  Ht 6' (1.829 m)  Wt 275 lb (124.739 kg)  BMI 37.29 kg/m2  SpO2 100% Physical Exam  Nursing note and vitals reviewed. Constitutional: She is oriented to person, place, and time. She appears well-developed and well-nourished.  Awake, alert, nontoxic appearance with baseline speech.  HENT:  Head: Atraumatic.  Eyes: Pupils are equal, round, and reactive to light. Right eye exhibits no discharge. Left eye exhibits no discharge.  Neck: Neck supple.  Cardiovascular: Normal rate and regular rhythm.   No murmur heard. Pulmonary/Chest: Effort normal and breath sounds normal. No respiratory distress. She has no wheezes. She has no rales. She exhibits no tenderness.  Abdominal: Soft. Bowel sounds are normal. She exhibits no mass. There is no tenderness. There is no rebound.  Musculoskeletal: She exhibits tenderness.       Thoracic back: She exhibits no tenderness.       Lumbar back: She exhibits no tenderness.  Moderate left flank pain that radiates to her LLQ pain  Neurological: She is alert and oriented to person, place, and time. No cranial nerve deficit. She exhibits normal muscle tone. Coordination normal.  Mental status baseline for patient.  Upper extremity motor strength and sensation intact and symmetric bilaterally.  Skin: Skin is warm. No rash noted.  Psychiatric: She has a normal mood and affect.    ED Course  Procedures (including critical care time)  DIAGNOSTIC STUDIES: Oxygen Saturation is 100% on room air, normal by my interpretation.    COORDINATION OF CARE:  3:17 PM Discussed course of care with pt . Pt understands and agrees.  5:50 PM- Pt rechecked and is resting comfortably with Ativan, Zofran and  Dilaudid. Informed pt of radiology and lab work results. Discussed discharge plan which includes Cipro and Norco with pt and pt agreed to plan. Also advised pt to follow up as needed and pt agreed. Addressed symptoms to return for with pt.   Labs Review Labs Reviewed  COMPREHENSIVE METABOLIC PANEL - Abnormal; Notable for the following:    Sodium 136 (*)  Glucose, Bld 333 (*)    Total Protein 8.6 (*)    Total Bilirubin 0.2 (*)    All other components within normal limits  URINALYSIS, ROUTINE W REFLEX MICROSCOPIC - Abnormal; Notable for the following:    Specific Gravity, Urine >1.030 (*)    Glucose, UA >1000 (*)    All other components within normal limits  URINE MICROSCOPIC-ADD ON - Abnormal; Notable for the following:    Squamous Epithelial / LPF MANY (*)    Bacteria, UA MANY (*)    All other components within normal limits  CBC WITH DIFFERENTIAL   Imaging Review Dg Abd Acute W/chest  09/12/2013   CLINICAL DATA:  Pain.  Burning sensation in pelvis.  EXAM: ACUTE ABDOMEN SERIES (ABDOMEN 2 VIEW & CHEST 1 VIEW)  COMPARISON:  CT ABD/PELV WO CM dated 09/01/2013; DG CHEST 2 VIEW dated 01/07/2013  FINDINGS: Cardiomediastinal silhouette is unremarkable. Low lung volumes. Lungs are clear, no pleural effusions. No pneumothorax. Soft tissue planes and included osseous structures are unremarkable.  Bowel gas pattern is nondilated and nonobstructive. No intra-abdominal mass effect, or free air. Subcentimeter calcifications projects lateral to a surgical clip, and seen to reflect a hepatic granuloma on prior CT. Soft tissue planes and included osseous structures are nonsuspicious, degenerative change of the lumbar spine. . Surgical clips in the right abdomen likely reflect cholecystectomy.  IMPRESSION: No acute cardiopulmonary process.  Nonspecific bowel gas pattern, status postcholecystectomy.   Electronically Signed   By: Elon Hickman   On: 09/12/2013 17:39     EKG Interpretation None      MDM    Final diagnoses:  None    The chart was scribed for me under my direct supervision.  I personally performed the history, physical, and medical decision making and all procedures in the evaluation of this patient.Maudry Diego, MD 09/12/13 [phone]

## 2013-09-12 NOTE — ED Notes (Signed)
Pt to be discharged home. Pt is to sleepy to drive. Pt request to go sit in her car until the medication wore off. Pt informed I could not let do that due to the weather. Pt called a ride to bring her home.

## 2013-09-12 NOTE — Discharge Instructions (Signed)
Follow up with your md in one week to check your urine

## 2013-09-12 NOTE — ED Notes (Signed)
Pt states severe burning pain to left lower back, radiating around to abdomen. Pt states she was recently here and dx with pyelonephritis. States same symptoms.

## 2013-09-14 LAB — URINE CULTURE
Colony Count: 40000
Special Requests: NORMAL

## 2013-12-05 ENCOUNTER — Telehealth: Payer: Self-pay | Admitting: Orthopedic Surgery

## 2013-12-05 ENCOUNTER — Other Ambulatory Visit: Payer: Self-pay | Admitting: Orthopedic Surgery

## 2013-12-05 MED ORDER — TRAMADOL HCL 50 MG PO TABS: 50.0000 mg | ORAL_TABLET | Freq: Four times a day (QID) | ORAL | Status: DC | PRN

## 2013-12-05 NOTE — Telephone Encounter (Signed)
Refilled per Dr. Aline Brochure

## 2013-12-05 NOTE — Telephone Encounter (Signed)
Patient called to request refill on Tramadol 50mg ; pharmacy is [facility] in [place].  She has no upcoming appointment scheduled.  Pt's ph# [phone].

## 2013-12-05 NOTE — Telephone Encounter (Signed)
Routing to Dr Harrison 

## 2014-02-11 ENCOUNTER — Encounter (HOSPITAL_COMMUNITY): Payer: Self-pay | Admitting: Emergency Medicine

## 2014-02-11 ENCOUNTER — Emergency Department (HOSPITAL_COMMUNITY)
Admission: EM | Admit: 2014-02-11 | Discharge: 2014-02-11 | Disposition: A | Discharge status: Home / Self Care | Payer: Self-pay | Attending: Emergency Medicine | Admitting: Emergency Medicine

## 2014-02-11 ENCOUNTER — Emergency Department (HOSPITAL_COMMUNITY): Payer: Self-pay

## 2014-02-11 DIAGNOSIS — Z79899 Other long term (current) drug therapy: Secondary | ICD-10-CM | POA: Insufficient documentation

## 2014-02-11 DIAGNOSIS — E119 Type 2 diabetes mellitus without complications: Secondary | ICD-10-CM | POA: Insufficient documentation

## 2014-02-11 DIAGNOSIS — F419 Anxiety disorder, unspecified: Secondary | ICD-10-CM

## 2014-02-11 DIAGNOSIS — R0602 Shortness of breath: Secondary | ICD-10-CM | POA: Insufficient documentation

## 2014-02-11 DIAGNOSIS — J45901 Unspecified asthma with (acute) exacerbation: Secondary | ICD-10-CM | POA: Insufficient documentation

## 2014-02-11 DIAGNOSIS — F17200 Nicotine dependence, unspecified, uncomplicated: Secondary | ICD-10-CM | POA: Insufficient documentation

## 2014-02-11 DIAGNOSIS — J40 Bronchitis, not specified as acute or chronic: Secondary | ICD-10-CM

## 2014-02-11 DIAGNOSIS — Z794 Long term (current) use of insulin: Secondary | ICD-10-CM | POA: Insufficient documentation

## 2014-02-11 DIAGNOSIS — F319 Bipolar disorder, unspecified: Secondary | ICD-10-CM | POA: Insufficient documentation

## 2014-02-11 DIAGNOSIS — Z792 Long term (current) use of antibiotics: Secondary | ICD-10-CM | POA: Insufficient documentation

## 2014-02-11 DIAGNOSIS — F411 Generalized anxiety disorder: Secondary | ICD-10-CM | POA: Insufficient documentation

## 2014-02-11 LAB — COMPREHENSIVE METABOLIC PANEL
ALK PHOS: 87 U/L (ref 39–117)
ALT: 21 U/L (ref 0–35)
ANION GAP: 14 (ref 5–15)
AST: 23 U/L (ref 0–37)
Albumin: 3.8 g/dL (ref 3.5–5.2)
BUN: 9 mg/dL (ref 6–23)
CALCIUM: 9.5 mg/dL (ref 8.4–10.5)
CO2: 21 meq/L (ref 19–32)
Chloride: 102 mEq/L (ref 96–112)
Creatinine, Ser: 0.81 mg/dL (ref 0.50–1.10)
GFR, EST NON AFRICAN AMERICAN: 79 mL/min — AB (ref >90)
GLUCOSE: 199 mg/dL — AB (ref 70–99)
POTASSIUM: 4.1 meq/L (ref 3.7–5.3)
SODIUM: 137 meq/L (ref 137–147)
TOTAL PROTEIN: 8 g/dL (ref 6.0–8.3)
Total Bilirubin: 0.2 mg/dL — ABNORMAL LOW (ref 0.3–1.2)

## 2014-02-11 LAB — CBC WITH DIFFERENTIAL/PLATELET
Basophils Absolute: 0 10*3/uL (ref 0.0–0.1)
Basophils Relative: 1 % (ref 0–1)
EOS PCT: 5 % (ref 0–5)
Eosinophils Absolute: 0.2 10*3/uL (ref 0.0–0.7)
HCT: 44.5 % (ref 36.0–46.0)
HEMOGLOBIN: 15.4 g/dL — AB (ref 12.0–15.0)
LYMPHS ABS: 3.2 10*3/uL (ref 0.7–4.0)
LYMPHS PCT: 62 % — AB (ref 12–46)
MCH: 29.1 pg (ref 26.0–34.0)
MCHC: 34.6 g/dL (ref 30.0–36.0)
MCV: 84.1 fL (ref 78.0–100.0)
Monocytes Absolute: 0.4 10*3/uL (ref 0.1–1.0)
Monocytes Relative: 7 % (ref 3–12)
Neutro Abs: 1.3 10*3/uL — ABNORMAL LOW (ref 1.7–7.7)
Neutrophils Relative %: 25 % — ABNORMAL LOW (ref 43–77)
Platelets: 241 10*3/uL (ref 150–400)
RBC: 5.29 MIL/uL — AB (ref 3.87–5.11)
RDW: 14.2 % (ref 11.5–15.5)
WBC: 5.1 10*3/uL (ref 4.0–10.5)

## 2014-02-11 LAB — PRO B NATRIURETIC PEPTIDE: Pro B Natriuretic peptide (BNP): <5 pg/mL (ref 0–125)

## 2014-02-11 LAB — TROPONIN I: Troponin I: <0.3 ng/mL (ref <0.30)

## 2014-02-11 MED ORDER — AMOXICILLIN 500 MG PO CAPS: 500.0000 mg | ORAL_CAPSULE | Freq: Three times a day (TID) | ORAL | Status: DC

## 2014-02-11 MED ORDER — PREDNISONE 10 MG PO TABS: 20.0000 mg | ORAL_TABLET | Freq: Every day | ORAL | Status: DC

## 2014-02-11 MED ORDER — LORAZEPAM 0.5 MG PO TABS: 0.5000 mg | ORAL_TABLET | Freq: Four times a day (QID) | ORAL | Status: DC | PRN

## 2014-02-11 MED ORDER — LORAZEPAM 2 MG/ML IJ SOLN
1.0000 mg | Freq: Once | INTRAMUSCULAR | Status: AC
  Administered 2014-02-11: 1 mg via INTRAVENOUS
  Filled 2014-02-11: qty 1

## 2014-02-11 NOTE — Discharge Instructions (Signed)
Use your inhaler every 4hours for wheezing.  Follow up with your md if not improving.

## 2014-02-11 NOTE — ED Notes (Signed)
Sob times two days.  Productive cough.  States her chest feels tight.

## 2014-02-11 NOTE — ED Provider Notes (Signed)
CSN: [id number]     Arrival date & time 02/11/14  0903 History  This chart was scribed for Maudry Diego, MD by Irene Pap, ED Scribe. This patient was seen in room APA02/APA02 and patient care was started at 9:13 AM.    Chief Complaint  Patient presents with  . Shortness of Breath   Patient is a 58 y.o. female presenting with shortness of breath. The history is provided by the patient. No language interpreter was used.  Shortness of Breath Severity:  Moderate Onset quality:  Sudden Duration:  2 days Timing:  Constant Progression:  Worsening Chronicity:  New Ineffective treatments:  Inhaler Associated symptoms: cough   Associated symptoms: no abdominal pain, no chest pain, no headaches and no rash    HPI Comments: Rhonda Hickman is a 58 y.o. Female with a history of asthma who presents to the Emergency Department complaining of SOB onset two days ago. Patient reports using breathing treatments to no relief. Patient reports associated productive cough with salty, green phlegm. Patient reports past history of hospital admissions for similar symptoms and states that she has had asthma for about 15 years. She [facility] and [facility] for [facility]th.   Past Medical History  Diagnosis Date  . Bipolar 1 disorder   . Diabetes mellitus   . Asthma   . Anxiety    Past Surgical History  Procedure Laterality Date  . Abdominal hysterectomy     History reviewed. No pertinent family history. History  Substance Use Topics  . Smoking status: Current Every Day Smoker    Types: Cigarettes  . Smokeless tobacco: Not on file  . Alcohol Use: No   OB History   Grav Para Term Preterm Abortions TAB SAB Ect Mult Living                 Review of Systems  Constitutional: Negative for appetite change and fatigue.  HENT: Negative for congestion, ear discharge and sinus pressure.   Eyes: Negative for discharge.  Respiratory: Positive for  cough and shortness of breath.   Cardiovascular: Negative for chest pain.  Gastrointestinal: Negative for abdominal pain and diarrhea.  Genitourinary: Negative for frequency and hematuria.  Musculoskeletal: Negative for back pain.  Skin: Negative for rash.  Neurological: Negative for seizures and headaches.  Psychiatric/Behavioral: Negative for hallucinations.   Allergies  Aspirin; Codeine; and Ibuprofen  Home Medications   Prior to Admission medications   Medication Sig Start Date End Date Taking? Authorizing Provider  albuterol (PROVENTIL HFA;VENTOLIN HFA) 108 (90 BASE) MCG/ACT inhaler Inhale 2 puffs into the lungs every 6 (six) hours as needed for wheezing.    Historical Provider, MD  albuterol (PROVENTIL) (2.5 MG/3ML) 0.083% nebulizer solution Take 2.5 mg by nebulization every 6 (six) hours as needed for shortness of breath.     Historical Provider, MD  cephALEXin (KEFLEX) 500 MG capsule Take 1 capsule (500 mg total) by mouth 4 (four) times daily. 09/01/13   Hoy Morn, MD  ciprofloxacin (CIPRO) 500 MG tablet Take 1 tablet (500 mg total) by mouth 2 (two) times daily. One po bid x 7 days 09/12/13   Maudry Diego, MD  divalproex (DEPAKOTE ER) 500 MG 24 hr tablet Take 1,000 mg by mouth 2 (two) times daily.     Historical Provider, MD  glipiZIDE (GLUCOTROL) 10 MG tablet Take 20 mg by mouth 2 (two) times daily before a meal.    Historical  Provider, MD  HYDROcodone-acetaminophen (NORCO/VICODIN) 5-325 MG per tablet Take 1 tablet by mouth every 6 (six) hours as needed for moderate pain. 09/12/13   Maudry Diego, MD  insulin NPH-regular (NOVOLIN 70/30) (70-30) 100 UNIT/ML injection Inject 20 Units into the skin 2 (two) times daily with a meal.     Historical Provider, MD  lisinopril (PRINIVIL,ZESTRIL) 2.5 MG tablet Take 2.5 mg by mouth daily.    Historical Provider, MD  metFORMIN (GLUCOPHAGE) 1000 MG tablet Take 1,000 mg by mouth 2 (two) times daily with a meal.     Historical Provider, MD   naproxen sodium (ALEVE) 220 MG tablet Take 220-440 mg by mouth 2 (two) times daily as needed (for pain).    Historical Provider, MD  promethazine (PHENERGAN) 25 MG tablet Take 1 tablet (25 mg total) by mouth every 6 (six) hours as needed for nausea or vomiting. 09/01/13   Hoy Morn, MD  traMADol (ULTRAM) 50 MG tablet Take 1 tablet (50 mg total) by mouth every 6 (six) hours as needed. pain 12/05/13   Carole Civil, MD  traZODone (DESYREL) 100 MG tablet Take 200 mg by mouth at bedtime.    Historical Provider, MD   BP 109/88  Pulse 67  Temp(Src) 97.6 F (36.4 C) (Oral)  Resp 24  SpO2 100%  Physical Exam  Constitutional: She is oriented to person, place, and time. She appears well-developed.  HENT:  Head: Normocephalic.  Eyes: Conjunctivae and EOM are normal. No scleral icterus.  Neck: Neck supple. No thyromegaly present.  Cardiovascular: Normal rate and regular rhythm.  Exam reveals no gallop and no friction rub.   No murmur heard. Pulmonary/Chest: No stridor. She has wheezes. She has no rales. She exhibits no tenderness.  minimal wheezing bilaterally  Abdominal: She exhibits no distension. There is no tenderness. There is no rebound.  Musculoskeletal: Normal range of motion. She exhibits no edema.  Lymphadenopathy:    She has no cervical adenopathy.  Neurological: She is oriented to person, place, and time. She exhibits normal muscle tone. Coordination normal.  Skin: No rash noted. No erythema.  Psychiatric: Her behavior is normal. Her mood appears anxious.   ED Course  Procedures (including critical care time) DIAGNOSTIC STUDIES: Oxygen Saturation is 100% on room air, normal by my interpretation.    COORDINATION OF CARE: 9:16 AM-Discussed treatment plan which includes labs with pt at bedside and pt agreed to plan.   Labs Review Labs Reviewed  CBC WITH DIFFERENTIAL - Abnormal; Notable for the following:    RBC 5.29 (*)    Hemoglobin 15.4 (*)    Neutrophils Relative  % 25 (*)    Neutro Abs 1.3 (*)    Lymphocytes Relative 62 (*)    All other components within normal limits  COMPREHENSIVE METABOLIC PANEL - Abnormal; Notable for the following:    Glucose, Bld 199 (*)    Total Bilirubin 0.2 (*)    GFR calc non Af Amer 79 (*)    All other components within normal limits  TROPONIN I  PRO B NATRIURETIC PEPTIDE   Imaging Review Dg Chest Portable 1 View  02/11/2014   CLINICAL DATA:  Shortness of breath and productive cough.  EXAM: PORTABLE CHEST - 1 VIEW  COMPARISON:  Acute abdominal series 09/12/2013.  FINDINGS: The heart size is within normal limits. Mild pulmonary vascular congestion is present. No focal airspace disease is evident. The visualized soft tissues and bony thorax are unremarkable.  IMPRESSION: 1. Mild pulmonary vascular  congestion without focal airspace disease.   Electronically Signed   By: Lawrence Santiago M.D.   On: 02/11/2014 09:38     EKG Interpretation None      MDM   Final diagnoses:  None    Bronchitis,  Bronchospasm,  Anxiety  The chart was scribed for me under my direct supervision.  I personally performed the history, physical, and medical decision making and all procedures in the evaluation of this patient.Maudry Diego, MD 02/11/14 [phone]

## 2014-06-11 ENCOUNTER — Telehealth: Payer: Self-pay | Admitting: Orthopedic Surgery

## 2014-06-11 ENCOUNTER — Other Ambulatory Visit: Payer: Self-pay | Admitting: *Deleted

## 2014-06-11 MED ORDER — TRAMADOL HCL 50 MG PO TABS: 50.0000 mg | ORAL_TABLET | Freq: Four times a day (QID) | ORAL | Status: DC | PRN

## 2014-06-11 NOTE — Telephone Encounter (Signed)
Patient is calling asking for a refill ontraMADol (ULTRAM) 50 MG tablet  Called to [facility] in [place]

## 2014-06-11 NOTE — Telephone Encounter (Signed)
Faxed as requested

## 2014-08-10 ENCOUNTER — Emergency Department (HOSPITAL_COMMUNITY)
Admission: EM | Admit: 2014-08-10 | Discharge: 2014-08-10 | Disposition: A | Discharge status: Home / Self Care | Payer: Self-pay | Attending: Emergency Medicine | Admitting: Emergency Medicine

## 2014-08-10 ENCOUNTER — Emergency Department (HOSPITAL_COMMUNITY): Payer: Self-pay

## 2014-08-10 ENCOUNTER — Encounter (HOSPITAL_COMMUNITY): Payer: Self-pay | Admitting: Emergency Medicine

## 2014-08-10 DIAGNOSIS — F419 Anxiety disorder, unspecified: Secondary | ICD-10-CM | POA: Insufficient documentation

## 2014-08-10 DIAGNOSIS — J45909 Unspecified asthma, uncomplicated: Secondary | ICD-10-CM | POA: Insufficient documentation

## 2014-08-10 DIAGNOSIS — F319 Bipolar disorder, unspecified: Secondary | ICD-10-CM | POA: Insufficient documentation

## 2014-08-10 DIAGNOSIS — Z79899 Other long term (current) drug therapy: Secondary | ICD-10-CM | POA: Insufficient documentation

## 2014-08-10 DIAGNOSIS — M79601 Pain in right arm: Secondary | ICD-10-CM | POA: Insufficient documentation

## 2014-08-10 DIAGNOSIS — E119 Type 2 diabetes mellitus without complications: Secondary | ICD-10-CM | POA: Insufficient documentation

## 2014-08-10 DIAGNOSIS — Z792 Long term (current) use of antibiotics: Secondary | ICD-10-CM | POA: Insufficient documentation

## 2014-08-10 DIAGNOSIS — Z7952 Long term (current) use of systemic steroids: Secondary | ICD-10-CM | POA: Insufficient documentation

## 2014-08-10 DIAGNOSIS — Z720 Tobacco use: Secondary | ICD-10-CM | POA: Insufficient documentation

## 2014-08-10 DIAGNOSIS — M545 Low back pain, unspecified: Secondary | ICD-10-CM

## 2014-08-10 DIAGNOSIS — M542 Cervicalgia: Secondary | ICD-10-CM | POA: Insufficient documentation

## 2014-08-10 HISTORY — DX: Other intervertebral disc degeneration, lumbar region: M51.36

## 2014-08-10 HISTORY — DX: Other intervertebral disc degeneration, lumbar region without mention of lumbar back pain or lower extremity pain: M51.369

## 2014-08-10 HISTORY — DX: Other intervertebral disc displacement, lumbar region: M51.26

## 2014-08-10 MED ORDER — HYDROMORPHONE HCL 1 MG/ML IJ SOLN
1.0000 mg | Freq: Once | INTRAMUSCULAR | Status: AC
  Administered 2014-08-10: 1 mg via INTRAMUSCULAR
  Filled 2014-08-10: qty 1

## 2014-08-10 MED ORDER — OXYCODONE-ACETAMINOPHEN 5-325 MG PO TABS: 2.0000 | ORAL_TABLET | ORAL | Status: DC | PRN

## 2014-08-10 MED ORDER — CYCLOBENZAPRINE HCL 10 MG PO TABS: 10.0000 mg | ORAL_TABLET | Freq: Two times a day (BID) | ORAL | Status: DC | PRN

## 2014-08-10 MED ORDER — PREDNISONE 50 MG PO TABS: ORAL_TABLET | ORAL | Status: DC

## 2014-08-10 MED ORDER — ONDANSETRON 8 MG PO TBDP
8.0000 mg | ORAL_TABLET | Freq: Once | ORAL | Status: AC
  Administered 2014-08-10: 8 mg via ORAL
  Filled 2014-08-10: qty 1

## 2014-08-10 NOTE — ED Notes (Signed)
Pt reports cough with thick yellow sputum. Rhonchi heard throughout lung fields.

## 2014-08-10 NOTE — ED Notes (Signed)
Pt reports low back pain that radiates to right arm and down arm. Pt describes pain as bunring shooting pain. Pt denies any recent injury

## 2014-08-10 NOTE — ED Notes (Signed)
Pt reports taking 4-5 Aleve to relieve symptoms. Pt has prescription for Tramadol which she takes for her knee but states they don't help.

## 2014-08-10 NOTE — ED Provider Notes (Signed)
CSN: [id number]     Arrival date & time 08/10/14  5093 History  This chart was scribed for Nat Christen, MD by Stephania Fragmin, ED Scribe. This patient was seen in room APA15/APA15 and the patient's care was started at 9:45 AM.    Chief Complaint  Patient presents with  . Arm Pain  . Back Pain   The history is provided by the patient. No language interpreter was used.    HPI Comments: Rhonda Hickman is a 59 y.o. female with a history of DM, asthma, bipolar 1 disorder, and anxiety who presents to the Emergency Department complaining of burning central lumbar pain that is chronic possibly due to degenerative disc and right arm pain that is worse when she abducts, with onset a couple weeks ago. She notes associated numbness and tingling, and burning pain in her neck. She denies injury or strain, since she no longer works. Aleve alleviates her pain somewhat but has been ineffective recently. She denies any pain radiating to her legs. She states she hasn't been able to get an appointment at the health department for this.   Past Medical History  Diagnosis Date  . Bipolar 1 disorder   . Diabetes mellitus   . Asthma   . Anxiety   . Bulging lumbar disc    Past Surgical History  Procedure Laterality Date  . Abdominal hysterectomy     No family history on file. History  Substance Use Topics  . Smoking status: Current Every Day Smoker    Types: Cigarettes  . Smokeless tobacco: Not on file  . Alcohol Use: No   OB History    No data available     Review of Systems  Musculoskeletal: Positive for myalgias, back pain and neck pain.  Neurological: Positive for numbness.  All other systems reviewed and are negative.     Allergies  Aspirin and Ibuprofen  Home Medications   Prior to Admission medications   Medication Sig Start Date End Date Taking? Authorizing Provider  albuterol (PROVENTIL HFA;VENTOLIN HFA) 108 (90 BASE) MCG/ACT inhaler Inhale 2 puffs into the lungs every 6 (six) hours as  needed for wheezing.    Historical Provider, MD  albuterol (PROVENTIL) (2.5 MG/3ML) 0.083% nebulizer solution Take 2.5 mg by nebulization every 6 (six) hours as needed for shortness of breath.     Historical Provider, MD  amoxicillin (AMOXIL) 500 MG capsule Take 1 capsule (500 mg total) by mouth 3 (three) times daily. 02/11/14   Maudry Diego, MD  cyclobenzaprine (FLEXERIL) 10 MG tablet Take 1 tablet (10 mg total) by mouth 2 (two) times daily as needed for muscle spasms. 08/10/14   Nat Christen, MD  divalproex (DEPAKOTE ER) 500 MG 24 hr tablet Take 1,000 mg by mouth 2 (two) times daily.     Historical Provider, MD  glipiZIDE (GLUCOTROL) 10 MG tablet Take 20 mg by mouth 2 (two) times daily before a meal.    Historical Provider, MD  insulin NPH-regular (NOVOLIN 70/30) (70-30) 100 UNIT/ML injection Inject 30 Units into the skin 2 (two) times daily with a meal.     Historical Provider, MD  LORazepam (ATIVAN) 0.5 MG tablet Take 1 tablet (0.5 mg total) by mouth every 6 (six) hours as needed for anxiety. 02/11/14   Maudry Diego, MD  metFORMIN (GLUCOPHAGE) 1000 MG tablet Take 1,000 mg by mouth 2 (two) times daily with a meal.     Historical Provider, MD  oxyCODONE-acetaminophen (PERCOCET) 5-325 MG per tablet Take 2  tablets by mouth every 4 (four) hours as needed. 08/10/14   Nat Christen, MD  predniSONE (DELTASONE) 50 MG tablet 1 tablet daily for 7 days 08/10/14   Nat Christen, MD  traMADol (ULTRAM) 50 MG tablet Take 1 tablet (50 mg total) by mouth every 6 (six) hours as needed. pain 06/11/14   Carole Civil, MD  traZODone (DESYREL) 100 MG tablet Take 200 mg by mouth at bedtime.    Historical Provider, MD   BP 120/79 mmHg  Pulse 57  Temp(Src) 98.3 F (36.8 C) (Oral)  Resp 18  Wt 275 lb (124.739 kg)  SpO2 100% Physical Exam  Constitutional: She is oriented to person, place, and time. She appears well-developed and well-nourished.  HENT:  Head: Normocephalic and atraumatic.  Eyes: Conjunctivae and EOM are  normal. Pupils are equal, round, and reactive to light.  Neck: Normal range of motion. Neck supple.  Cardiovascular: Normal rate and regular rhythm.   Pulmonary/Chest: Effort normal and breath sounds normal.  Abdominal: Soft. Bowel sounds are normal.  Musculoskeletal: Normal range of motion. She exhibits tenderness.  Central lumbar spinal tenderness. Pain in right arm with abduction.  Neurological: She is alert and oriented to person, place, and time.  Skin: Skin is warm and dry.  Psychiatric: She has a normal mood and affect. Her behavior is normal.  Nursing note and vitals reviewed.   ED Course  Procedures (including critical care time)  DIAGNOSTIC STUDIES: Oxygen Saturation is 99% on room air, normal by my interpretation.    COORDINATION OF CARE: 9:47 AM - Discussed treatment plan with pt at bedside which includes an injection of pain medication, and pt agreed to plan.   Labs Review Labs Reviewed - No data to display  Imaging Review No results found.   EKG Interpretation None      MDM   Final diagnoses:  Midline low back pain without sciatica  Upper extremity pain, anterior, right   Patient's main concern appears to be pain management. Her low back pain is chronic. Right upper arm/shoulder pain increases with abduction. Plain film of chest negative. Pain management. Discharge medications Percocet and Flexeril 10 mg.  I personally performed the services described in this documentation, which was scribed in my presence. The recorded information has been reviewed and is accurate.      Nat Christen, MD 08/12/14 1020

## 2014-08-10 NOTE — Discharge Instructions (Signed)
Prescriptions for pain medication, muscle relaxer, prednisone for inflammation. Follow-up at health department

## 2014-09-22 ENCOUNTER — Ambulatory Visit (HOSPITAL_COMMUNITY)
Admission: RE | Admit: 2014-09-22 | Discharge: 2014-09-22 | Disposition: A | Discharge status: Home / Self Care | Payer: Self-pay | Source: Ambulatory Visit | Attending: *Deleted | Admitting: *Deleted

## 2014-09-22 ENCOUNTER — Other Ambulatory Visit (HOSPITAL_COMMUNITY): Payer: Self-pay | Admitting: *Deleted

## 2014-09-22 DIAGNOSIS — M542 Cervicalgia: Secondary | ICD-10-CM | POA: Insufficient documentation

## 2014-09-22 DIAGNOSIS — M47812 Spondylosis without myelopathy or radiculopathy, cervical region: Secondary | ICD-10-CM | POA: Insufficient documentation

## 2014-11-19 ENCOUNTER — Encounter: Payer: Self-pay | Admitting: Orthopedic Surgery

## 2014-11-19 ENCOUNTER — Other Ambulatory Visit: Payer: Self-pay | Admitting: *Deleted

## 2014-11-19 ENCOUNTER — Telehealth: Payer: Self-pay | Admitting: Orthopedic Surgery

## 2014-11-19 MED ORDER — TRAMADOL HCL 50 MG PO TABS: 50.0000 mg | ORAL_TABLET | Freq: Four times a day (QID) | ORAL | Status: DC | PRN

## 2014-11-19 NOTE — Telephone Encounter (Signed)
Patient is calling requesting a refill on traMADol (ULTRAM) 50 MG tablet , please advise?

## 2014-11-20 NOTE — Telephone Encounter (Signed)
Refill faxed to pharmacy.

## 2015-01-20 ENCOUNTER — Telehealth: Payer: Self-pay | Admitting: Orthopedic Surgery

## 2015-01-20 NOTE — Telephone Encounter (Signed)
Dr. Aline Brochure Rhonda Hickman is calling stating that the traMADol (ULTRAM) 50 MG tablet is not help her knee pain at all, she is asking for something a little stronger, please advise?

## 2015-01-20 NOTE — Telephone Encounter (Signed)
ibuproen 800 tid

## 2015-01-21 ENCOUNTER — Other Ambulatory Visit: Payer: Self-pay | Admitting: *Deleted

## 2015-01-21 MED ORDER — IBUPROFEN 800 MG PO TABS: 800.0000 mg | ORAL_TABLET | Freq: Three times a day (TID) | ORAL | Status: DC

## 2015-01-21 NOTE — Telephone Encounter (Signed)
PRESCRIPTION SENT TO PATIENTS PHARMACY

## 2015-01-21 NOTE — Telephone Encounter (Signed)
Patient is aware 

## 2015-01-26 ENCOUNTER — Telehealth: Payer: Self-pay | Admitting: Orthopedic Surgery

## 2015-01-26 NOTE — Telephone Encounter (Signed)
Patient called back regarding recent request for prescription, per recent phone notes. She had asked per note 01/20/15, if there was something stronger than Tramadol, or a stronger dose of this medication. She then had been prescribed Ibuprofen, and was advised by nurse to pick up the prescription.  She states that she just found out that there was a prescription for Ibuprofen at her [place] and she said she had spoken with our office and was told that she could take over the counter Ibuprofen.  She now is stating "I can't take Ibuprofen".  Please advise.  (also - patient appears to not have been seen here since November of 2014.)  Her ph# is [phone].

## 2015-01-26 NOTE — Telephone Encounter (Signed)
Can you prescribe anything other than Tramadol or Ibuprofen?

## 2015-01-27 NOTE — Telephone Encounter (Signed)
Then its tramadol nothing else

## 2015-01-27 NOTE — Telephone Encounter (Signed)
Patient called back and stated she received Jaimes' message and she stated that she would just continue the tramadol's because she cant take Ibuprofren

## 2015-01-27 NOTE — Telephone Encounter (Signed)
Left voicemail advising  

## 2015-02-12 ENCOUNTER — Other Ambulatory Visit: Payer: Self-pay | Admitting: *Deleted

## 2015-02-12 DIAGNOSIS — M1712 Unilateral primary osteoarthritis, left knee: Secondary | ICD-10-CM

## 2015-02-12 MED ORDER — DICLOFENAC SODIUM 75 MG PO TBEC: 75.0000 mg | DELAYED_RELEASE_TABLET | Freq: Two times a day (BID) | ORAL | Status: DC

## 2015-04-01 ENCOUNTER — Other Ambulatory Visit: Payer: Self-pay | Admitting: Orthopedic Surgery

## 2015-04-02 ENCOUNTER — Other Ambulatory Visit: Payer: Self-pay | Admitting: Orthopedic Surgery

## 2015-04-02 DIAGNOSIS — M1712 Unilateral primary osteoarthritis, left knee: Secondary | ICD-10-CM

## 2015-04-02 DIAGNOSIS — M23322 Other meniscus derangements, posterior horn of medial meniscus, left knee: Secondary | ICD-10-CM

## 2015-04-02 MED ORDER — DICLOFENAC SODIUM 75 MG PO TBEC: 75.0000 mg | DELAYED_RELEASE_TABLET | Freq: Two times a day (BID) | ORAL | Status: DC

## 2015-04-02 NOTE — Telephone Encounter (Signed)
Patient is calling asking if a refill of diclofenac (VOLTAREN) 75 MG EC tablet  Has been called to [facility], please advise?

## 2015-04-03 ENCOUNTER — Other Ambulatory Visit: Payer: Self-pay | Admitting: *Deleted

## 2015-04-03 DIAGNOSIS — M1712 Unilateral primary osteoarthritis, left knee: Secondary | ICD-10-CM

## 2015-04-03 MED ORDER — DICLOFENAC SODIUM 75 MG PO TBEC: 75.0000 mg | DELAYED_RELEASE_TABLET | Freq: Two times a day (BID) | ORAL | Status: DC

## 2015-04-29 ENCOUNTER — Encounter (HOSPITAL_COMMUNITY): Payer: Self-pay | Admitting: *Deleted

## 2015-04-29 ENCOUNTER — Emergency Department (HOSPITAL_COMMUNITY)
Admission: EM | Admit: 2015-04-29 | Discharge: 2015-04-29 | Discharge status: Against Medical Advice | Payer: Self-pay | Attending: Emergency Medicine | Admitting: Emergency Medicine

## 2015-04-29 DIAGNOSIS — Z720 Tobacco use: Secondary | ICD-10-CM | POA: Insufficient documentation

## 2015-04-29 DIAGNOSIS — E119 Type 2 diabetes mellitus without complications: Secondary | ICD-10-CM | POA: Insufficient documentation

## 2015-04-29 DIAGNOSIS — G8929 Other chronic pain: Secondary | ICD-10-CM | POA: Insufficient documentation

## 2015-04-29 DIAGNOSIS — M25562 Pain in left knee: Secondary | ICD-10-CM | POA: Insufficient documentation

## 2015-04-29 DIAGNOSIS — J45909 Unspecified asthma, uncomplicated: Secondary | ICD-10-CM | POA: Insufficient documentation

## 2015-04-29 HISTORY — DX: Pain in left knee: M25.562

## 2015-04-29 HISTORY — DX: Other chronic pain: G89.29

## 2015-04-29 NOTE — ED Provider Notes (Signed)
In to exam room # 24 to see patient Rhonda Hickman and no one in room. RN states she saw patient leaving room and going toward exit.   Patient here with chronic left nee pain.   BP 141/78 mmHg  Pulse 85  Temp(Src) 98.1 F (36.7 C)  Resp 20  Ht 6' (1.829 m)  Wt 280 lb (127.007 kg)  BMI 37.97 kg/m2  SpO2 99%   [facility], NP 04/29/15 2039  Gareth Morgan, MD 05/01/15 [phone]

## 2015-04-29 NOTE — ED Notes (Signed)
Pt c/o left knee pain x 2 months. Pt states she has an appt at the health dept Nov.1st but can't wait that long due to the pain.

## 2015-04-29 NOTE — ED Notes (Signed)
Went into patient's room, no one in room. Patient left without notifying anyone she was leaving.

## 2015-05-28 ENCOUNTER — Other Ambulatory Visit: Payer: Self-pay | Admitting: Orthopedic Surgery

## 2015-05-28 ENCOUNTER — Telehealth: Payer: Self-pay | Admitting: *Deleted

## 2015-05-28 MED ORDER — TRAMADOL HCL 50 MG PO TABS: 50.0000 mg | ORAL_TABLET | Freq: Four times a day (QID) | ORAL | Status: DC | PRN

## 2015-05-28 NOTE — Telephone Encounter (Signed)
Routing to Dr Harrison for approval 

## 2015-05-28 NOTE — Telephone Encounter (Signed)
Approved i wrote electronically needs sig

## 2015-05-28 NOTE — Telephone Encounter (Signed)
Patient called requesting tramadol 50mg  to be refilled to [facility] in [place]. Please advise

## 2015-06-01 NOTE — Telephone Encounter (Signed)
Prescription faxed to pharmacy.

## 2015-06-15 ENCOUNTER — Encounter (HOSPITAL_COMMUNITY): Payer: Self-pay | Admitting: *Deleted

## 2015-06-15 ENCOUNTER — Emergency Department (HOSPITAL_COMMUNITY)
Admission: EM | Admit: 2015-06-15 | Discharge: 2015-06-15 | Disposition: A | Discharge status: Home / Self Care | Payer: Self-pay | Attending: Emergency Medicine | Admitting: Emergency Medicine

## 2015-06-15 ENCOUNTER — Emergency Department (HOSPITAL_COMMUNITY): Payer: Self-pay

## 2015-06-15 DIAGNOSIS — F319 Bipolar disorder, unspecified: Secondary | ICD-10-CM | POA: Insufficient documentation

## 2015-06-15 DIAGNOSIS — Z791 Long term (current) use of non-steroidal anti-inflammatories (NSAID): Secondary | ICD-10-CM | POA: Insufficient documentation

## 2015-06-15 DIAGNOSIS — J159 Unspecified bacterial pneumonia: Secondary | ICD-10-CM | POA: Insufficient documentation

## 2015-06-15 DIAGNOSIS — J189 Pneumonia, unspecified organism: Secondary | ICD-10-CM

## 2015-06-15 DIAGNOSIS — R200 Anesthesia of skin: Secondary | ICD-10-CM | POA: Insufficient documentation

## 2015-06-15 DIAGNOSIS — F17210 Nicotine dependence, cigarettes, uncomplicated: Secondary | ICD-10-CM | POA: Insufficient documentation

## 2015-06-15 DIAGNOSIS — Z79899 Other long term (current) drug therapy: Secondary | ICD-10-CM | POA: Insufficient documentation

## 2015-06-15 DIAGNOSIS — Z794 Long term (current) use of insulin: Secondary | ICD-10-CM | POA: Insufficient documentation

## 2015-06-15 DIAGNOSIS — J45901 Unspecified asthma with (acute) exacerbation: Secondary | ICD-10-CM | POA: Insufficient documentation

## 2015-06-15 DIAGNOSIS — R064 Hyperventilation: Secondary | ICD-10-CM | POA: Insufficient documentation

## 2015-06-15 DIAGNOSIS — E119 Type 2 diabetes mellitus without complications: Secondary | ICD-10-CM | POA: Insufficient documentation

## 2015-06-15 DIAGNOSIS — M7989 Other specified soft tissue disorders: Secondary | ICD-10-CM | POA: Insufficient documentation

## 2015-06-15 DIAGNOSIS — R61 Generalized hyperhidrosis: Secondary | ICD-10-CM | POA: Insufficient documentation

## 2015-06-15 DIAGNOSIS — G8929 Other chronic pain: Secondary | ICD-10-CM | POA: Insufficient documentation

## 2015-06-15 DIAGNOSIS — F410 Panic disorder [episodic paroxysmal anxiety] without agoraphobia: Secondary | ICD-10-CM | POA: Insufficient documentation

## 2015-06-15 DIAGNOSIS — Z792 Long term (current) use of antibiotics: Secondary | ICD-10-CM | POA: Insufficient documentation

## 2015-06-15 LAB — BASIC METABOLIC PANEL
ANION GAP: 5 (ref 5–15)
BUN: 11 mg/dL (ref 6–20)
CHLORIDE: 105 mmol/L (ref 101–111)
CO2: 25 mmol/L (ref 22–32)
CREATININE: 1.09 mg/dL — AB (ref 0.44–1.00)
Calcium: 9.5 mg/dL (ref 8.9–10.3)
GFR calc non Af Amer: 55 mL/min — ABNORMAL LOW (ref >60)
Glucose, Bld: 256 mg/dL — ABNORMAL HIGH (ref 65–99)
POTASSIUM: 3.8 mmol/L (ref 3.5–5.1)
SODIUM: 135 mmol/L (ref 135–145)

## 2015-06-15 LAB — CBC
HEMATOCRIT: 42.4 % (ref 36.0–46.0)
HEMOGLOBIN: 14.7 g/dL (ref 12.0–15.0)
MCH: 29.1 pg (ref 26.0–34.0)
MCHC: 34.7 g/dL (ref 30.0–36.0)
MCV: 83.8 fL (ref 78.0–100.0)
PLATELETS: 270 10*3/uL (ref 150–400)
RBC: 5.06 MIL/uL (ref 3.87–5.11)
RDW: 14.6 % (ref 11.5–15.5)
WBC: 7.3 10*3/uL (ref 4.0–10.5)

## 2015-06-15 LAB — TROPONIN I: Troponin I: <0.03 ng/mL (ref <0.031)

## 2015-06-15 MED ORDER — LORAZEPAM 2 MG/ML IJ SOLN
1.0000 mg | INTRAMUSCULAR | Status: DC | PRN
Start: 2015-06-15 — End: 2015-06-15
  Administered 2015-06-15: 1 mg via INTRAVENOUS
  Filled 2015-06-15: qty 1

## 2015-06-15 MED ORDER — MORPHINE SULFATE (PF) 4 MG/ML IV SOLN
4.0000 mg | INTRAVENOUS | Status: DC | PRN
Start: 2015-06-15 — End: 2015-06-15
  Administered 2015-06-15: 4 mg via INTRAVENOUS
  Filled 2015-06-15: qty 1

## 2015-06-15 MED ORDER — LEVOFLOXACIN 500 MG PO TABS: 500.0000 mg | ORAL_TABLET | Freq: Every day | ORAL | Status: DC

## 2015-06-15 MED ORDER — LEVOFLOXACIN 750 MG PO TABS
750.0000 mg | ORAL_TABLET | Freq: Once | ORAL | Status: AC
  Administered 2015-06-15: 750 mg via ORAL
  Filled 2015-06-15: qty 1

## 2015-06-15 MED ORDER — HYDROCODONE-ACETAMINOPHEN 7.5-325 MG/15ML PO SOLN: 10.0000 mL | Freq: Four times a day (QID) | ORAL | Status: DC | PRN

## 2015-06-15 MED ORDER — ONDANSETRON HCL 4 MG/2ML IJ SOLN
4.0000 mg | Freq: Once | INTRAMUSCULAR | Status: AC
  Administered 2015-06-15: 4 mg via INTRAVENOUS
  Filled 2015-06-15: qty 2

## 2015-06-15 NOTE — ED Notes (Signed)
Pt states she began having chest pain before dark last night. Pt states she had some shortness of breath so she used an inhaler that yielded no effect. Pt is extremely anxious upon triage. Pt given instructions to take slow deep breaths.

## 2015-06-15 NOTE — Discharge Instructions (Signed)

## 2015-06-15 NOTE — ED Provider Notes (Signed)
CSN: [id number]     Arrival date & time 06/15/15  1032 History  By signing my name below, I, Tula Nakayama, attest that this documentation has been prepared under the direction and in the presence of Tanna Furry, MD.  Electronically Signed: Tula Nakayama, ED Scribe. 06/15/2015. 11:28 AM.  Chief Complaint  Patient presents with  . Chest Pain   The history is provided by the patient. No language interpreter was used.    HPI Comments: Rhonda Hickman is a 59 y.o. female with a history of bipolar disorder, DM, asthma and anxiety who presents to the Emergency Department complaining of constant, moderate central chest pain that radiates to her back and started yesterday. Pt reports diaphoresis, SOB, bilateral leg swelling and numbness of her right arm and shoulder as associated symptoms. She also notes that she smells musty. Pt denies a history of CAD or cardiac factors. She quit smoking 3 months ago, but smoked one cigarette yesterday for pain. Smoking did not relieve symptoms. Pt denies a history of similar pain and estrogen use. She takes Depakote for bipolar disorder and has been compliant with medication. Pt does not take Lithium. She denies fever.    Past Medical History  Diagnosis Date  . Bipolar 1 disorder ([place])   . Diabetes mellitus   . Asthma   . Anxiety   . Bulging lumbar disc   . Chronic pain of left knee    Past Surgical History  Procedure Laterality Date  . Abdominal hysterectomy     No family history on file. Social History  Substance Use Topics  . Smoking status: Current Every Day Smoker    Types: Cigarettes  . Smokeless tobacco: None  . Alcohol Use: No   OB History    No data available     Review of Systems  Constitutional: Positive for diaphoresis. Negative for fever, chills, appetite change and fatigue.  HENT: Negative for mouth sores, sore throat and trouble swallowing.   Eyes: Negative for visual disturbance.  Respiratory: Positive for shortness of breath.  Negative for cough, chest tightness and wheezing.   Cardiovascular: Positive for chest pain and leg swelling.  Gastrointestinal: Negative for nausea, vomiting, abdominal pain, diarrhea and abdominal distention.  Endocrine: Negative for polydipsia, polyphagia and polyuria.  Genitourinary: Negative for dysuria, frequency and hematuria.  Musculoskeletal: Negative for gait problem.  Skin: Negative for color change, pallor and rash.  Neurological: Positive for numbness. Negative for dizziness, syncope, light-headedness and headaches.  Hematological: Does not bruise/bleed easily.  Psychiatric/Behavioral: Negative for behavioral problems and confusion.   Allergies  Aspirin and Ibuprofen  Home Medications   Prior to Admission medications   Medication Sig Start Date End Date Taking? Authorizing Provider  albuterol (PROVENTIL HFA;VENTOLIN HFA) 108 (90 BASE) MCG/ACT inhaler Inhale 2 puffs into the lungs every 6 (six) hours as needed for wheezing.   Yes Historical Provider, MD  albuterol (PROVENTIL) (2.5 MG/3ML) 0.083% nebulizer solution Take 2.5 mg by nebulization every 6 (six) hours as needed for shortness of breath.    Yes Historical Provider, MD  diclofenac (VOLTAREN) 75 MG EC tablet Take 1 tablet (75 mg total) by mouth 2 (two) times daily with a meal. 04/03/15  Yes Carole Civil, MD  divalproex (DEPAKOTE ER) 500 MG 24 hr tablet Take 1,000 mg by mouth 2 (two) times daily.    Yes Historical Provider, MD  gabapentin (NEURONTIN) 300 MG capsule Take 300 mg by mouth 3 (three) times daily.   Yes Historical Provider, MD  insulin NPH-regular (NOVOLIN 70/30) (70-30) 100 UNIT/ML injection Inject 50 Units into the skin 2 (two) times daily with a meal.    Yes Historical Provider, MD  oxybutynin (DITROPAN) 5 MG tablet Take 5 mg by mouth 2 (two) times daily.   Yes Historical Provider, MD  sulfamethoxazole-trimethoprim (BACTRIM DS,SEPTRA DS) 800-160 MG tablet Take 1 tablet by mouth 2 (two) times daily.   Yes  Historical Provider, MD  traMADol (ULTRAM) 50 MG tablet Take 1 tablet (50 mg total) by mouth every 6 (six) hours as needed. pain 05/28/15  Yes Carole Civil, MD  traZODone (DESYREL) 100 MG tablet Take 200 mg by mouth at bedtime.   Yes Historical Provider, MD  HYDROcodone-acetaminophen (HYCET) 7.5-325 mg/15 ml solution Take 10 mLs by mouth every 6 (six) hours as needed (Pain or cough). 06/15/15   Tanna Furry, MD  levofloxacin (LEVAQUIN) 500 MG tablet Take 1 tablet (500 mg total) by mouth daily. 06/15/15   Tanna Furry, MD   BP 108/58 mmHg  Pulse 53  Temp(Src) 97.7 F (36.5 C) (Oral)  Resp 17  Ht 5\' 6"  (1.676 m)  Wt 275 lb (124.739 kg)  BMI 44.41 kg/m2  SpO2 97% Physical Exam  Constitutional: She is oriented to person, place, and time. She appears well-developed and well-nourished. No distress.  Hyperventilating  Anxious No carpopedal syndrome  HENT:  Head: Normocephalic.  Eyes: Conjunctivae are normal. Pupils are equal, round, and reactive to light. No scleral icterus.  Neck: Normal range of motion. Neck supple. No thyromegaly present.  Cardiovascular: Normal rate and regular rhythm.  Exam reveals no gallop and no friction rub.   No murmur heard. Pulmonary/Chest: Effort normal and breath sounds normal. No respiratory distress. She has no wheezes. She has no rales.  Abdominal: Soft. Bowel sounds are normal. She exhibits no distension. There is no tenderness. There is no rebound.  Musculoskeletal: Normal range of motion.  Neurological: She is alert and oriented to person, place, and time.  Skin: Skin is warm and dry. No rash noted.  Psychiatric: She has a normal mood and affect. Her behavior is normal.  Nursing note and vitals reviewed.   ED Course  Procedures  DIAGNOSTIC STUDIES: Oxygen Saturation is 100% on RA, normal by my interpretation.    COORDINATION OF CARE: 11:30 AM Discussed treatment plan with pt which includes lab work, chest x-ray, pain management and Ativan. She  agreed to plan.  Labs Review Labs Reviewed  BASIC METABOLIC PANEL - Abnormal; Notable for the following:    Glucose, Bld 256 (*)    Creatinine, Ser 1.09 (*)    GFR calc non Af Amer 55 (*)    All other components within normal limits  CBC  TROPONIN I    Imaging Review Dg Chest 2 View  06/15/2015  CLINICAL DATA:  Chest pain. EXAM: CHEST  2 VIEW COMPARISON:  08/10/2014. FINDINGS: Mediastinum and hilar structures are normal. Very minimal infiltrate right upper lobe cannot be excluded. Cardiomegaly with normal pulmonary vascularity. No pleural effusion or pneumothorax. IMPRESSION: 1.  Very minimal infiltrate right upper lobe cannot be excluded. 2.  Mild cardiomegaly.  No pulmonary venous congestion . Electronically Signed   By: Marcello Moores  Register   On: 06/15/2015 12:16   I have personally reviewed and evaluated these images and lab results as part of my medical decision-making.   EKG Interpretation None      MDM   Final diagnoses:  Community acquired pneumonia    Given IV Ativan. Much more  calm and sleeping on recheck. Well oxygenated. X-ray suggests a subtle right upper lobe opacity. Not tachycardic or hypoxemic. Plan at is pill Levaquin. Treatment of her cough and chest wall pain. Doubt ACS, doubt PE, possible pneumonia. Definite panic attack with anxiety and hyperventilation.    Tanna Furry, MD 06/15/15 1336

## 2015-07-15 ENCOUNTER — Telehealth: Payer: Self-pay | Admitting: *Deleted

## 2015-07-15 NOTE — Telephone Encounter (Signed)
Routing to Dr Aline Brochure for [place] only

## 2015-07-15 NOTE — Telephone Encounter (Signed)
Patient called stating her neighbor gave her a medication to try that they got from the pharmacy, patient stated she is going to try that first and if it does not work she will call back to try to get something refilled. I made patient aware I will let Dr. Aline Brochure know.

## 2015-07-15 NOTE — Telephone Encounter (Signed)
Patient called stating she has been taking Diclofenac that Dr. Aline Brochure prescribed her, patient stated her sister is taking [place] for ruhmatoid arthritis and she has took some of her sister's medication which seems to help her better, Patient is requesting for Dr. Aline Brochure to prescribe her that or something else because the diclofenac doesn't seem to be helping as much anymore. Patient was last seen by Dr. Aline Brochure 05/22/2013. Please advise patient [phone].

## 2015-12-15 ENCOUNTER — Other Ambulatory Visit: Payer: Self-pay | Admitting: Orthopedic Surgery

## 2015-12-15 ENCOUNTER — Telehealth: Payer: Self-pay | Admitting: Orthopedic Surgery

## 2015-12-15 NOTE — Telephone Encounter (Signed)
ROUTING TO DR HARRISON FOR APPROVAL 

## 2015-12-15 NOTE — Telephone Encounter (Signed)
Patient requests refill of medication: traMADol (ULTRAM) 50 MG tablet [id number] - [place]; states still has no insurance; therefore, has been unable to return for office visit.  Her last visit with Dr Aline Brochure was 05/22/2013. Please advise.  Patient's ph# [phone]

## 2015-12-15 NOTE — Telephone Encounter (Signed)
yes

## 2015-12-16 ENCOUNTER — Other Ambulatory Visit: Payer: Self-pay | Admitting: Orthopedic Surgery

## 2015-12-16 ENCOUNTER — Other Ambulatory Visit: Payer: Self-pay | Admitting: *Deleted

## 2015-12-16 MED ORDER — TRAMADOL HCL 50 MG PO TABS: 50.0000 mg | ORAL_TABLET | Freq: Four times a day (QID) | ORAL | Status: DC | PRN

## 2015-12-16 NOTE — Telephone Encounter (Signed)
-  FAXED TO PHARMACY.

## 2015-12-24 ENCOUNTER — Encounter (HOSPITAL_COMMUNITY): Payer: Self-pay | Admitting: Emergency Medicine

## 2015-12-24 ENCOUNTER — Emergency Department (HOSPITAL_COMMUNITY)
Admission: EM | Admit: 2015-12-24 | Discharge: 2015-12-24 | Disposition: A | Discharge status: Home / Self Care | Payer: Self-pay | Attending: Emergency Medicine | Admitting: Emergency Medicine

## 2015-12-24 DIAGNOSIS — J449 Chronic obstructive pulmonary disease, unspecified: Secondary | ICD-10-CM | POA: Insufficient documentation

## 2015-12-24 DIAGNOSIS — Z79899 Other long term (current) drug therapy: Secondary | ICD-10-CM | POA: Insufficient documentation

## 2015-12-24 DIAGNOSIS — J45909 Unspecified asthma, uncomplicated: Secondary | ICD-10-CM | POA: Insufficient documentation

## 2015-12-24 DIAGNOSIS — E119 Type 2 diabetes mellitus without complications: Secondary | ICD-10-CM | POA: Insufficient documentation

## 2015-12-24 DIAGNOSIS — F319 Bipolar disorder, unspecified: Secondary | ICD-10-CM | POA: Insufficient documentation

## 2015-12-24 DIAGNOSIS — K047 Periapical abscess without sinus: Secondary | ICD-10-CM | POA: Insufficient documentation

## 2015-12-24 DIAGNOSIS — Z794 Long term (current) use of insulin: Secondary | ICD-10-CM | POA: Insufficient documentation

## 2015-12-24 HISTORY — DX: Chronic obstructive pulmonary disease, unspecified: J44.9

## 2015-12-24 LAB — CBG MONITORING, ED: Glucose-Capillary: 238 mg/dL — ABNORMAL HIGH (ref 65–99)

## 2015-12-24 MED ORDER — AMOXICILLIN 500 MG PO CAPS: 500.0000 mg | ORAL_CAPSULE | Freq: Three times a day (TID) | ORAL | Status: DC

## 2015-12-24 MED ORDER — LIDOCAINE-EPINEPHRINE (PF) 1 %-1:200000 IJ SOLN
INTRAMUSCULAR | Status: AC
  Filled 2015-12-24: qty 30

## 2015-12-24 MED ORDER — HYDROCODONE-ACETAMINOPHEN 5-325 MG PO TABS
1.0000 | ORAL_TABLET | ORAL | Status: DC | PRN
Start: 2015-12-24 — End: 2016-05-06

## 2015-12-24 NOTE — ED Notes (Signed)
Pt c/o dental pain to the left side.

## 2015-12-24 NOTE — ED Provider Notes (Signed)
CSN: [id number]     Arrival date & time 12/24/15  1902 History   First MD Initiated Contact with Patient 12/24/15 1910     Chief Complaint  Patient presents with  . Dental Pain     (Consider location/radiation/quality/duration/timing/severity/associated sxs/prior Treatment) Patient is a 60 y.o. female presenting with tooth pain. The history is provided by the patient.  Dental Pain Location:  Lower Lower teeth location:  21/LL 1st bicuspid Quality:  Throbbing Severity:  Severe Onset quality:  Gradual Duration:  1 day Timing:  Constant Progression:  Worsening Chronicity:  New Context: abscess   Relieved by:  Nothing Worsened by:  Cold food/drink and pressure Ineffective treatments:  NSAIDs Associated symptoms: facial pain and facial swelling   Risk factors: lack of dental care    Rhonda Hickman is a 61 y.o. female who presents to the ED with dental pain that started 2 weeks ago when patient tried to pull a decayed tooth and it broke off into the gum. Today the area started swelling and she noted increased pain and swelling of her face.   Past Medical History  Diagnosis Date  . Bipolar 1 disorder ([place])   . Diabetes mellitus   . Asthma   . Anxiety   . Bulging lumbar disc   . Chronic pain of left knee   . COPD (chronic obstructive pulmonary disease) [facility])    Past Surgical History  Procedure Laterality Date  . Abdominal hysterectomy     History reviewed. No pertinent family history. Social History  Substance Use Topics  . Smoking status: Former Smoker    Types: Cigarettes  . Smokeless tobacco: None  . Alcohol Use: No   OB History    No data available     Review of Systems  HENT: Positive for dental problem and facial swelling.   all other systems negative    Allergies  Aspirin and Ibuprofen  Home Medications   Prior to Admission medications   Medication Sig Start Date End Date Taking? Authorizing Provider  albuterol (PROVENTIL HFA;VENTOLIN HFA) 108 (90  BASE) MCG/ACT inhaler Inhale 2 puffs into the lungs every 6 (six) hours as needed for wheezing.    Historical Provider, MD  albuterol (PROVENTIL) (2.5 MG/3ML) 0.083% nebulizer solution Take 2.5 mg by nebulization every 6 (six) hours as needed for shortness of breath.     Historical Provider, MD  amoxicillin (AMOXIL) 500 MG capsule Take 1 capsule (500 mg total) by mouth 3 (three) times daily. 12/24/15   Rhonda Frett Bunnie Pion, NP  diclofenac (VOLTAREN) 75 MG EC tablet Take 1 tablet (75 mg total) by mouth 2 (two) times daily with a meal. 04/03/15   Carole Civil, MD  divalproex (DEPAKOTE ER) 500 MG 24 hr tablet Take 1,000 mg by mouth 2 (two) times daily.     Historical Provider, MD  gabapentin (NEURONTIN) 300 MG capsule Take 300 mg by mouth 3 (three) times daily.    Historical Provider, MD  HYDROcodone-acetaminophen (NORCO/VICODIN) 5-325 MG tablet Take 1 tablet by mouth every 4 (four) hours as needed. 12/24/15   Rhonda Hickman Bunnie Pion, NP  insulin NPH-regular (NOVOLIN 70/30) (70-30) 100 UNIT/ML injection Inject 50 Units into the skin 2 (two) times daily with a meal.     Historical Provider, MD  levofloxacin (LEVAQUIN) 500 MG tablet Take 1 tablet (500 mg total) by mouth daily. 06/15/15   Tanna Furry, MD  oxybutynin (DITROPAN) 5 MG tablet Take 5 mg by mouth 2 (two) times daily.  Historical Provider, MD  sulfamethoxazole-trimethoprim (BACTRIM DS,SEPTRA DS) 800-160 MG tablet Take 1 tablet by mouth 2 (two) times daily.    Historical Provider, MD  traMADol (ULTRAM) 50 MG tablet Take 1 tablet (50 mg total) by mouth every 6 (six) hours as needed. pain 12/16/15   Carole Civil, MD  traZODone (DESYREL) 100 MG tablet Take 200 mg by mouth at bedtime.    Historical Provider, MD   BP 119/93 mmHg  Pulse 88  Temp(Src) 98.4 F (36.9 C) (Oral)  Resp 18  Ht 6' (1.829 m)  Wt 122.925 kg  BMI 36.75 kg/m2  SpO2 100% Physical Exam  Constitutional: She is oriented to person, place, and time. She appears well-developed and  well-nourished.  HENT:  Head: Normocephalic.  Right Ear: Tympanic membrane normal.  Left Ear: Tympanic membrane normal.  Nose: Nose normal.  Mouth/Throat: Uvula is midline.    Dental decay that extends into the gumline with abscess noted.   Eyes: Conjunctivae and EOM are normal.  Neck: Normal range of motion. Neck supple.  Cardiovascular: Normal rate.   Pulmonary/Chest: Effort normal.  Abdominal: Soft. There is no tenderness.  Musculoskeletal: Normal range of motion.  Neurological: She is alert and oriented to person, place, and time. No cranial nerve deficit.  Skin: Skin is warm and dry.  Psychiatric: She has a normal mood and affect. Her behavior is normal.  Nursing note and vitals reviewed.   ED Course  Procedures  INCISION AND DRAINAGE Performed by: Rhonda Hickman Consent: Verbal consent obtained. Risks and benefits: risks, benefits and alternatives were discussed Type: abscess  Body area: left lower dental area  Anesthesia: local infiltration  Local anesthetic: lidocaine 1% with epinephrine  Anesthetic total: 0.5 ml  Incision: #11 blade straight single  Complexity: complex  Drainage: purulent  Drainage amount: moderate  Packing material: none  Patient tolerance: Patient tolerated the procedure well with no immediate complications.  Patient reports feeling much better after the procedure  MDM  60 y.o. female with pain and swelling of the gum on the left lower dental area stable for d/c after abscess drained. Will start antibiotics and dental referral guide given. Salt water rinses and pain management.  Discussed with the patient and all questioned fully answered. She will return if any problems arise.   Final diagnoses:  Dental abscess        Ashley Murrain, NP 12/26/15 [place], DO 12/26/15 [phone]

## 2015-12-24 NOTE — Discharge Instructions (Signed)
Continue to use Aleve with the medications we give you. Do not drive while taking the narcotic as it will make you sleepy. Follow up with a dentist as soon as possible. [facility] [employer] [address] is a great source of information about community services available.  Access by dialing 2-1-1 from anywhere in [place], or by website -  CustodianSupply.fi.   Other Local Resources (Updated 07/2015)  Dental  Care   Services    Phone Number and Address  Cost  [facility] For children 4 - 52 years of age:   Cleaning  Tooth brushing/flossing instruction  Sealants, fillings, crowns  Extractions  Emergency treatment  [phone] [address], [place] [address] Charges based on family income.  Medicaid and some insurance plans accepted.     [facility] - [facility], fillings, crowns  Extractions  Emergency treatment [phone] [address], [place]  Pregnant women 53 years of age or older with a Medicaid card  [facility] - [place]  Cleaning  Sealants, fillings, crowns  Extractions  Emergency treatment [phone] [address] [place], [place] Pregnant women 20 years of age or older with a Medicaid card  [facility] For children 63 - 7 years of age:   Cleaning  Tooth brushing/flossing instruction  Sealants, fillings, crowns  Extractions  Emergency treatment Limited orthodontic services for patients with Medicaid [phone] [address], [place] [address] Medicaid and [facility] Health Choice cover for children up to age 83 and pregnant women.  Parents of children up to age 3 without Medicaid pay a reduced fee at time of service.  [facility] For children 45 - 67 years of age:    Cleaning  Tooth brushing/flossing instruction  Sealants, fillings, crowns  Extractions  Emergency treatment Limited orthodontic services for patients with Medicaid [phone] [place], [place].  Medicaid and [place] Health Choice cover for children up to age 50 and pregnant women.  Parents of children up to age 40 without Medicaid pay a reduced fee.  [facility]  Sealants, fillings, crowns  Extractions  Hours: Tuesdays and Thursdays, 4:15 - 8 pm [phone] [address] [place], [place] [address] Services free of charge to [facility] residents ages 18-64 who do not have health insurance, Medicare, [place], or [place] benefits and fall within federal poverty guidelines  [place] care in addition to primary medical care, nutritional counseling, and pharmacy:  Engineer, drilling, fillings, crowns  Extractions                  [phone] [facility], [place], [place], [place], [place], [place] [facility], [place], [place] [facility], [place], [place], most insurance.  Also provides services available to all with fees adjusted based on ability to pay.    [facility]  Cleaning  Tooth brushing/flossing instruction  Sealants, fillings, crowns  Extractions  Emergency treatment Hours: Tuesdays, Thursdays, and Fridays from 8 am  to 5 pm by appointment only. [phone] [place], [place] [address] [facility] residents with Medicaid (depending on eligibility) and children with [facility] Health Choice - call for more  information.  [facility]  Extractions only  Hours: 2nd and 4th Thursday of each month from 6:30 am - 9 am.   [phone] ext. [place], [place] [address] Ages 36 and older only.  Patients are seen on a first come, first served basis.  [employer]  [employer]  Extractions  Orthodontics  Endodontics  Implants/Crowns/Bridges  Complete and partial dentures [phone] [place],  Patients must complete an application for services.  There is often a waiting list.     Dental Abscess A dental abscess is a collection of pus in or around a tooth. CAUSES This condition is caused by a bacterial infection around the root of the tooth that involves the inner part of the tooth (pulp). It may result from:  Severe tooth decay.  Trauma to the tooth that allows bacteria to enter into the pulp, such as a broken or chipped tooth.  Severe gum disease around a tooth. SYMPTOMS Symptoms of this condition include:  Severe pain in and around the infected tooth.  Swelling and redness around the infected tooth, in the mouth, or in the face.  Tenderness.  Pus drainage.  Bad breath.  Bitter taste in the mouth.  Difficulty swallowing.  Difficulty opening the mouth.  Nausea.  Vomiting.  Chills.  Swollen neck glands.  Fever. DIAGNOSIS This condition is diagnosed with examination of the infected tooth. During the exam, your dentist may tap on the infected tooth. Your dentist will also ask about your medical and dental history and may order X-rays. TREATMENT This condition is treated by eliminating the infection. This may be done with:  Antibiotic medicine.  A root canal. This may be performed to save the tooth.  Pulling (extracting) the tooth. This may also involve draining the abscess. This is done if the tooth cannot be saved. HOME CARE INSTRUCTIONS  Take medicines only as directed by your dentist.  If you were  prescribed antibiotic medicine, finish all of it even if you start to feel better.  Rinse your mouth (gargle) often with salt water to relieve pain or swelling.  Do not drive or operate heavy machinery while taking pain medicine.  Do not apply heat to the outside of your mouth.  Keep all follow-up visits as directed by your dentist. This is important. SEEK MEDICAL CARE IF:  Your pain is worse and is not helped by medicine. SEEK IMMEDIATE MEDICAL CARE IF:  You have a fever or chills.  Your symptoms suddenly get worse.  You have a very bad headache.  You have problems breathing or swallowing.  You have trouble opening your mouth.  You have swelling in your neck or around your eye.   This information is not intended to replace advice given to you by your health care provider. Make sure you discuss any questions you have with your health care provider.   Document Released: 06/27/2005 Document Revised: 11/11/2014 Document Reviewed: 06/24/2014 Elsevier Interactive Patient Education [employer].

## 2016-02-29 ENCOUNTER — Encounter (HOSPITAL_COMMUNITY): Payer: Self-pay | Admitting: Emergency Medicine

## 2016-02-29 ENCOUNTER — Emergency Department (HOSPITAL_COMMUNITY)
Admission: EM | Admit: 2016-02-29 | Discharge: 2016-02-29 | Disposition: A | Discharge status: Home / Self Care | Payer: Self-pay | Attending: Emergency Medicine | Admitting: Emergency Medicine

## 2016-02-29 DIAGNOSIS — F17210 Nicotine dependence, cigarettes, uncomplicated: Secondary | ICD-10-CM | POA: Insufficient documentation

## 2016-02-29 DIAGNOSIS — Z792 Long term (current) use of antibiotics: Secondary | ICD-10-CM | POA: Insufficient documentation

## 2016-02-29 DIAGNOSIS — Z794 Long term (current) use of insulin: Secondary | ICD-10-CM | POA: Insufficient documentation

## 2016-02-29 DIAGNOSIS — J45909 Unspecified asthma, uncomplicated: Secondary | ICD-10-CM | POA: Insufficient documentation

## 2016-02-29 DIAGNOSIS — J449 Chronic obstructive pulmonary disease, unspecified: Secondary | ICD-10-CM | POA: Insufficient documentation

## 2016-02-29 DIAGNOSIS — Z79899 Other long term (current) drug therapy: Secondary | ICD-10-CM | POA: Insufficient documentation

## 2016-02-29 DIAGNOSIS — E119 Type 2 diabetes mellitus without complications: Secondary | ICD-10-CM | POA: Insufficient documentation

## 2016-02-29 DIAGNOSIS — M25561 Pain in right knee: Secondary | ICD-10-CM | POA: Insufficient documentation

## 2016-02-29 MED ORDER — TRAMADOL HCL 50 MG PO TABS: ORAL_TABLET | ORAL | 0 refills | Status: DC

## 2016-02-29 MED ORDER — DICLOFENAC SODIUM 1 % TD GEL: 2.0000 g | Freq: Four times a day (QID) | TRANSDERMAL | 1 refills | Status: DC

## 2016-02-29 NOTE — ED Provider Notes (Signed)
[facility] DEPT Provider Note   CSN: [id number] Arrival date & time: 02/29/16  0825     History   Chief Complaint Chief Complaint  Patient presents with  . Knee Pain    HPI Rhonda Hickman is a 60 y.o. female.  Pt c/o pain from the right knee to the right thigh. Posterior calf also hurt with up and about with a "needle sticking" sensation.   The history is provided by the patient.  Knee Pain   This is a chronic problem. The problem occurs daily. The pain is present in the right knee. The quality of the pain is described as sharp and aching. The pain is at a severity of 9/10. Associated symptoms include limited range of motion. The symptoms are aggravated by activity and standing. She has tried cold and OTC ointments for the symptoms. The treatment provided no relief.    Past Medical History:  Diagnosis Date  . Anxiety   . Asthma   . Bipolar 1 disorder ([place])   . Bulging lumbar disc   . Chronic pain of left knee   . COPD (chronic obstructive pulmonary disease) ([place])   . Diabetes mellitus     Patient Active Problem List   Diagnosis Date Noted  . DIABETES MELLITUS, TYPE II 02/16/2007  . ABUSE, COCAINE, UNSPECIFIED 02/16/2007  . ASTHMA, EXTRINSIC NOS 02/16/2007  . HOMELESSNESS, HX OF 02/16/2007    Past Surgical History:  Procedure Laterality Date  . ABDOMINAL HYSTERECTOMY      OB History    No data available       Home Medications    Prior to Admission medications   Medication Sig Start Date End Date Taking? Authorizing Provider  albuterol (PROVENTIL HFA;VENTOLIN HFA) 108 (90 BASE) MCG/ACT inhaler Inhale 2 puffs into the lungs every 6 (six) hours as needed for wheezing.    Historical Provider, MD  albuterol (PROVENTIL) (2.5 MG/3ML) 0.083% nebulizer solution Take 2.5 mg by nebulization every 6 (six) hours as needed for shortness of breath.     Historical Provider, MD  amoxicillin (AMOXIL) 500 MG capsule Take 1 capsule (500 mg total) by mouth 3 (three) times  daily. 12/24/15   Hope Bunnie Pion, NP  diclofenac (VOLTAREN) 75 MG EC tablet Take 1 tablet (75 mg total) by mouth 2 (two) times daily with a meal. 04/03/15   Carole Civil, MD  divalproex (DEPAKOTE ER) 500 MG 24 hr tablet Take 1,000 mg by mouth 2 (two) times daily.     Historical Provider, MD  gabapentin (NEURONTIN) 300 MG capsule Take 300 mg by mouth 3 (three) times daily.    Historical Provider, MD  HYDROcodone-acetaminophen (NORCO/VICODIN) 5-325 MG tablet Take 1 tablet by mouth every 4 (four) hours as needed. 12/24/15   Hope Bunnie Pion, NP  insulin NPH-regular (NOVOLIN 70/30) (70-30) 100 UNIT/ML injection Inject 50 Units into the skin 2 (two) times daily with a meal.     Historical Provider, MD  levofloxacin (LEVAQUIN) 500 MG tablet Take 1 tablet (500 mg total) by mouth daily. 06/15/15   Tanna Furry, MD  oxybutynin (DITROPAN) 5 MG tablet Take 5 mg by mouth 2 (two) times daily.    Historical Provider, MD  sulfamethoxazole-trimethoprim (BACTRIM DS,SEPTRA DS) 800-160 MG tablet Take 1 tablet by mouth 2 (two) times daily.    Historical Provider, MD  traMADol (ULTRAM) 50 MG tablet Take 1 tablet (50 mg total) by mouth every 6 (six) hours as needed. pain 12/16/15   Carole Civil, MD  traZODone (DESYREL) 100 MG tablet Take 200 mg by mouth at bedtime.    Historical Provider, MD    Family History History reviewed. No pertinent family history.  Social History Social History  Substance Use Topics  . Smoking status: Current Some Day Smoker    Types: Cigarettes  . Smokeless tobacco: Never Used  . Alcohol use No     Allergies   Aspirin and Ibuprofen   Review of Systems Review of Systems  Musculoskeletal: Positive for arthralgias.  Psychiatric/Behavioral: The patient is nervous/anxious.        Bipolar disorder  All other systems reviewed and are negative.    Physical Exam Updated Vital Signs BP 120/84 (BP Location: Left Arm)   Pulse 63   Temp 97.7 F (36.5 C) (Oral)   Resp 18   Ht 6'  (1.829 m)   Wt 122.5 kg   SpO2 99%   BMI 36.62 kg/m   Physical Exam  Constitutional: She is oriented to person, place, and time. She appears well-developed and well-nourished.  Non-toxic appearance.  HENT:  Head: Normocephalic.  Right Ear: Tympanic membrane and external ear normal.  Left Ear: Tympanic membrane and external ear normal.  Eyes: EOM and lids are normal. Pupils are equal, round, and reactive to light.  Neck: Normal range of motion. Neck supple. Carotid bruit is not present.  Cardiovascular: Normal rate, regular rhythm, normal heart sounds, intact distal pulses and normal pulses.   Pulmonary/Chest: No respiratory distress. She has wheezes.  Abdominal: Soft. Bowel sounds are normal. There is no tenderness. There is no guarding.  Musculoskeletal:       Right knee: She exhibits decreased range of motion. She exhibits no deformity and no erythema. Tenderness found. Medial joint line tenderness noted.  Lymphadenopathy:       Head (right side): No submandibular adenopathy present.       Head (left side): No submandibular adenopathy present.    She has no cervical adenopathy.  Neurological: She is alert and oriented to person, place, and time. She has normal strength. No cranial nerve deficit or sensory deficit.  Skin: Skin is warm and dry.  Psychiatric: She has a normal mood and affect. Her speech is normal.  Nursing note and vitals reviewed.    ED Treatments / Results  Labs (all labs ordered are listed, but only abnormal results are displayed) Labs Reviewed - No data to display  EKG  EKG Interpretation None       Radiology No results found.  Procedures Procedures (including critical care time)  Medications Ordered in ED Medications - No data to display   Initial Impression / Assessment and Plan / ED Course  I have reviewed the triage vital signs and the nursing notes.  Pertinent labs & imaging results that were available during my care of the patient were  reviewed by me and considered in my medical decision making (see chart for details).  Clinical Course    *I have reviewed nursing notes, vital signs, and all appropriate lab and imaging results for this patient.**  Final Clinical Impressions(s) / ED Diagnoses Examination favors DJD exacerbation. No evidence for fx or dislocation. No hot joint noted. Pt has several allergies and  Intolerances. Rx for diclofenac gel and pain medication given to  The patient. Pt to follow up with PCP.    Final diagnoses:  Knee pain, right    New Prescriptions New Prescriptions   No medications on file     Lily Kocher, [place] 03/01/16  [place], DO 03/01/16 1600

## 2016-02-29 NOTE — ED Triage Notes (Signed)
Patient complaining of right knee pain x 3 days. Denies injury.

## 2016-02-29 NOTE — Discharge Instructions (Signed)
Heating pad to your knee may be helpful. Use of the Ace bandage on your knee may also be helpful. Please use Tylenol every 4 hours for mild pain, may use Ultram every 6 hours for more severe pain. This medication may cause drowsiness, please use with caution. Discussed the use of diclofenac cream for knee pain with your primary physician.

## 2016-03-09 ENCOUNTER — Ambulatory Visit (INDEPENDENT_AMBULATORY_CARE_PROVIDER_SITE_OTHER): Payer: Self-pay | Admitting: Orthopaedic Surgery

## 2016-03-09 ENCOUNTER — Other Ambulatory Visit: Payer: Self-pay | Admitting: Radiology

## 2016-03-09 ENCOUNTER — Ambulatory Visit (INDEPENDENT_AMBULATORY_CARE_PROVIDER_SITE_OTHER): Payer: Self-pay

## 2016-03-09 ENCOUNTER — Encounter: Payer: Self-pay | Admitting: Orthopaedic Surgery

## 2016-03-09 VITALS — BP 124/82 | HR 95 | Temp 97.3°F | Ht 71.0 in | Wt 282.0 lb

## 2016-03-09 DIAGNOSIS — M25561 Pain in right knee: Secondary | ICD-10-CM

## 2016-03-09 DIAGNOSIS — F17200 Nicotine dependence, unspecified, uncomplicated: Secondary | ICD-10-CM

## 2016-03-09 DIAGNOSIS — Z720 Tobacco use: Secondary | ICD-10-CM

## 2016-03-09 DIAGNOSIS — J441 Chronic obstructive pulmonary disease with (acute) exacerbation: Secondary | ICD-10-CM | POA: Insufficient documentation

## 2016-03-09 DIAGNOSIS — J449 Chronic obstructive pulmonary disease, unspecified: Secondary | ICD-10-CM | POA: Insufficient documentation

## 2016-03-09 DIAGNOSIS — F411 Generalized anxiety disorder: Secondary | ICD-10-CM

## 2016-03-09 NOTE — Progress Notes (Signed)
Subjective:  My right knee hurts a lot    Patient ID: Rhonda Hickman, female    DOB: 1955-08-13, 60 y.o.   MRN: [id number]  HPI She has had about a month of pain with the right knee.  She went to the ER about this on August 21st.  She has swelling, popping, giving way.  It buckles under her without warning.  She has fallen.  It hurts to weight bear.  She has more pain medially.  She has tried rest, heat, ice, rubs, Advil and Aleve.  The ER gave her a gel to rub on it and it did not help.  She is tired of it hurting and giving way.   Review of Systems  HENT: Negative for congestion.   Respiratory: Positive for shortness of breath. Negative for cough.   Cardiovascular: Negative for chest pain and leg swelling.  Endocrine: Positive for cold intolerance.  Musculoskeletal: Positive for arthralgias, gait problem and joint swelling.  Allergic/Immunologic: Positive for environmental allergies.  Psychiatric/Behavioral: The patient is nervous/anxious.    Past Medical History:  Diagnosis Date  . Anxiety   . Asthma   . Bipolar 1 disorder ([place])   . Bulging lumbar disc   . Chronic pain of left knee   . COPD (chronic obstructive pulmonary disease) ([place])   . Diabetes mellitus     Past Surgical History:  Procedure Laterality Date  . ABDOMINAL HYSTERECTOMY      Current Outpatient Prescriptions on File Prior to Visit  Medication Sig Dispense Refill  . albuterol (PROVENTIL HFA;VENTOLIN HFA) 108 (90 BASE) MCG/ACT inhaler Inhale 2 puffs into the lungs every 6 (six) hours as needed for wheezing.    Marland Kitchen albuterol (PROVENTIL) (2.5 MG/3ML) 0.083% nebulizer solution Take 2.5 mg by nebulization every 6 (six) hours as needed for shortness of breath.     Marland Kitchen amoxicillin (AMOXIL) 500 MG capsule Take 1 capsule (500 mg total) by mouth 3 (three) times daily. 30 capsule 0  . diclofenac (VOLTAREN) 75 MG EC tablet Take 1 tablet (75 mg total) by mouth 2 (two) times daily with a meal. 60 tablet 5  . diclofenac sodium  (VOLTAREN) 1 % GEL Apply 2 g topically 4 (four) times daily. 100 g 1  . divalproex (DEPAKOTE ER) 500 MG 24 hr tablet Take 1,000 mg by mouth 2 (two) times daily.     Marland Kitchen gabapentin (NEURONTIN) 300 MG capsule Take 300 mg by mouth 3 (three) times daily.    Marland Kitchen HYDROcodone-acetaminophen (NORCO/VICODIN) 5-325 MG tablet Take 1 tablet by mouth every 4 (four) hours as needed. 10 tablet 0  . insulin NPH-regular (NOVOLIN 70/30) (70-30) 100 UNIT/ML injection Inject 50 Units into the skin 2 (two) times daily with a meal.     . levofloxacin (LEVAQUIN) 500 MG tablet Take 1 tablet (500 mg total) by mouth daily. 10 tablet 0  . oxybutynin (DITROPAN) 5 MG tablet Take 5 mg by mouth 2 (two) times daily.    Marland Kitchen sulfamethoxazole-trimethoprim (BACTRIM DS,SEPTRA DS) 800-160 MG tablet Take 1 tablet by mouth 2 (two) times daily.    . traMADol (ULTRAM) 50 MG tablet 1 or 2 po q6h prn pain 20 tablet 0  . traZODone (DESYREL) 100 MG tablet Take 200 mg by mouth at bedtime.     No current facility-administered medications on file prior to visit.     Social History   Social History  . Marital status: Single    Spouse name: N/A  . Number of children: N/A  .  Years of education: N/A   Occupational History  . Not on file.   Social History Main Topics  . Smoking status: Current Some Day Smoker    Types: Cigarettes  . Smokeless tobacco: Never Used  . Alcohol use No  . Drug use: No  . Sexual activity: No   Other Topics Concern  . Not on file   Social History Narrative  . No narrative on file    Family history of hypertension, heart disease.  BP 124/82   Pulse 95   Temp 97.3 F (36.3 C)   Ht 5\' 11"  (1.803 m)   Wt 282 lb (127.9 kg)   BMI 39.33 kg/m      Objective:   Physical Exam  Constitutional: She is oriented to person, place, and time. She appears well-developed and well-nourished.  HENT:  Head: Normocephalic and atraumatic.  Eyes: Conjunctivae and EOM are normal. Pupils are equal, round, and reactive to  light.  Neck: Normal range of motion. Neck supple.  Cardiovascular: Normal rate, regular rhythm and intact distal pulses.   Pulmonary/Chest: Effort normal.  Abdominal: Soft.  Musculoskeletal: She exhibits tenderness (Pain right knee with decided limp to the right, NV intact, effusion 2+, positive Medial [place], crepitus.  Left knee tender but good motion.  NV intact.).  Neurological: She is alert and oriented to person, place, and time. She displays normal reflexes. No cranial nerve deficit. She exhibits normal muscle tone. Coordination normal.  Skin: Skin is warm and dry.  Psychiatric: She has a normal mood and affect. Her behavior is normal. Judgment and thought content normal.   X-rays were done of the right knee, reported separately.       Assessment & Plan:   Encounter Diagnoses  Name Primary?  . Right knee pain Yes  . Chronic obstructive pulmonary disease, unspecified COPD type ([place])   . Tobacco smoker within last 12 months   . Anxiety state    She is extremely anxious.  She almost hyperventilated when talking about getting MRI and possible injection of the knee.    She declines injection of the knee, her right.  I will set up MRI of the right knee.    She could use a crutch or cane.  She smokes and she needs to cut back on this.  She will consider.  Call if any problem.  Return after MRI.  Precautions discussed.  Electronically Signed Sanjuana Kava, MD 8/30/20172:29 PM

## 2016-03-09 NOTE — Patient Instructions (Signed)
Smoking Cessation, Tips for Success If you are ready to quit smoking, congratulations! You have chosen to help yourself be healthier. Cigarettes bring nicotine, tar, carbon monoxide, and other irritants into your body. Your lungs, heart, and blood vessels will be able to work better without these poisons. There are many different ways to quit smoking. Nicotine gum, nicotine patches, a nicotine inhaler, or nicotine nasal spray can help with physical craving. Hypnosis, support groups, and medicines help break the habit of smoking. WHAT THINGS CAN I DO TO MAKE QUITTING EASIER?  Here are some tips to help you quit for good:  Pick a date when you will quit smoking completely. Tell all of your friends and family about your plan to quit on that date.  Do not try to slowly cut down on the number of cigarettes you are smoking. Pick a quit date and quit smoking completely starting on that day.  Throw away all cigarettes.   Clean and remove all ashtrays from your home, work, and car.  On a card, write down your reasons for quitting. Carry the card with you and read it when you get the urge to smoke.  Cleanse your body of nicotine. Drink enough water and fluids to keep your urine clear or pale yellow. Do this after quitting to flush the nicotine from your body.  Learn to predict your moods. Do not let a bad situation be your excuse to have a cigarette. Some situations in your life might tempt you into wanting a cigarette.  Never have "just one" cigarette. It leads to wanting another and another. Remind yourself of your decision to quit.  Change habits associated with smoking. If you smoked while driving or when feeling stressed, try other activities to replace smoking. Stand up when drinking your coffee. Brush your teeth after eating. Sit in a different chair when you read the paper. Avoid alcohol while trying to quit, and try to drink fewer caffeinated beverages. Alcohol and caffeine may urge you to  smoke.  Avoid foods and drinks that can trigger a desire to smoke, such as sugary or spicy foods and alcohol.  Ask people who smoke not to smoke around you.  Have something planned to do right after eating or having a cup of coffee. For example, plan to take a walk or exercise.  Try a relaxation exercise to calm you down and decrease your stress. Remember, you may be tense and nervous for the first 2 weeks after you quit, but this will pass.  Find new activities to keep your hands busy. Play with a pen, coin, or rubber band. Doodle or draw things on paper.  Brush your teeth right after eating. This will help cut down on the craving for the taste of tobacco after meals. You can also try mouthwash.   Use oral substitutes in place of cigarettes. Try using lemon drops, carrots, cinnamon sticks, or chewing gum. Keep them handy so they are available when you have the urge to smoke.  When you have the urge to smoke, try deep breathing.  Designate your home as a nonsmoking area.  If you are a heavy smoker, ask your health care provider about a prescription for nicotine chewing gum. It can ease your withdrawal from nicotine.  Reward yourself. Set aside the cigarette money you save and buy yourself something nice.  Look for support from others. Join a support group or smoking cessation program. Ask someone at home or at work to help you with your plan   to quit smoking.  Always ask yourself, "Do I need this cigarette or is this just a reflex?" Tell yourself, "Today, I choose not to smoke," or "I do not want to smoke." You are reminding yourself of your decision to quit.  Do not replace cigarette smoking with electronic cigarettes (commonly called e-cigarettes). The safety of e-cigarettes is unknown, and some may contain harmful chemicals.  If you relapse, do not give up! Plan ahead and think about what you will do the next time you get the urge to smoke. HOW WILL I FEEL WHEN I QUIT SMOKING? You  may have symptoms of withdrawal because your body is used to nicotine (the addictive substance in cigarettes). You may crave cigarettes, be irritable, feel very hungry, cough often, get headaches, or have difficulty concentrating. The withdrawal symptoms are only temporary. They are strongest when you first quit but will go away within 10-14 days. When withdrawal symptoms occur, stay in control. Think about your reasons for quitting. Remind yourself that these are signs that your body is healing and getting used to being without cigarettes. Remember that withdrawal symptoms are easier to treat than the major diseases that smoking can cause.  Even after the withdrawal is over, expect periodic urges to smoke. However, these cravings are generally short lived and will go away whether you smoke or not. Do not smoke! WHAT RESOURCES ARE AVAILABLE TO HELP ME QUIT SMOKING? Your health care provider can direct you to community resources or hospitals for support, which may include:  Group support.  Education.  Hypnosis.  Therapy.   This information is not intended to replace advice given to you by your health care provider. Make sure you discuss any questions you have with your health care provider.   Document Released: 03/25/2004 Document Revised: 07/18/2014 Document Reviewed: 12/13/2012 Elsevier Interactive Patient Education 2016 Elsevier Inc.  

## 2016-03-18 ENCOUNTER — Ambulatory Visit (HOSPITAL_COMMUNITY)
Admission: RE | Admit: 2016-03-18 | Discharge: 2016-03-18 | Disposition: A | Discharge status: Home / Self Care | Payer: Self-pay | Source: Ambulatory Visit | Attending: Orthopaedic Surgery | Admitting: Orthopaedic Surgery

## 2016-03-18 DIAGNOSIS — M25461 Effusion, right knee: Secondary | ICD-10-CM | POA: Insufficient documentation

## 2016-03-18 DIAGNOSIS — S83281A Other tear of lateral meniscus, current injury, right knee, initial encounter: Secondary | ICD-10-CM | POA: Insufficient documentation

## 2016-03-18 DIAGNOSIS — M659 Synovitis and tenosynovitis, unspecified: Secondary | ICD-10-CM | POA: Insufficient documentation

## 2016-03-18 DIAGNOSIS — S83241A Other tear of medial meniscus, current injury, right knee, initial encounter: Secondary | ICD-10-CM | POA: Insufficient documentation

## 2016-03-18 DIAGNOSIS — M1711 Unilateral primary osteoarthritis, right knee: Secondary | ICD-10-CM | POA: Insufficient documentation

## 2016-03-18 DIAGNOSIS — X58XXXA Exposure to other specified factors, initial encounter: Secondary | ICD-10-CM | POA: Insufficient documentation

## 2016-03-18 DIAGNOSIS — S838X1A Sprain of other specified parts of right knee, initial encounter: Secondary | ICD-10-CM | POA: Insufficient documentation

## 2016-03-18 DIAGNOSIS — M25561 Pain in right knee: Secondary | ICD-10-CM | POA: Insufficient documentation

## 2016-03-22 ENCOUNTER — Ambulatory Visit (INDEPENDENT_AMBULATORY_CARE_PROVIDER_SITE_OTHER): Payer: Self-pay | Admitting: Orthopaedic Surgery

## 2016-03-22 ENCOUNTER — Other Ambulatory Visit: Payer: Self-pay | Admitting: *Deleted

## 2016-03-22 ENCOUNTER — Encounter: Payer: Self-pay | Admitting: Orthopaedic Surgery

## 2016-03-22 VITALS — BP 146/86 | HR 80 | Ht 71.0 in | Wt 283.0 lb

## 2016-03-22 DIAGNOSIS — F17200 Nicotine dependence, unspecified, uncomplicated: Secondary | ICD-10-CM

## 2016-03-22 DIAGNOSIS — J449 Chronic obstructive pulmonary disease, unspecified: Secondary | ICD-10-CM

## 2016-03-22 DIAGNOSIS — Z720 Tobacco use: Secondary | ICD-10-CM

## 2016-03-22 DIAGNOSIS — M25561 Pain in right knee: Secondary | ICD-10-CM

## 2016-03-22 NOTE — Progress Notes (Signed)
Patient IQ:[id number] Disch, female DOB:[birth year], 60 y.o. [id number]  Chief Complaint  Patient presents with  . Follow-up    right knee pain    HPI  Rhonda Hickman is a 60 y.o. female who has right knee pain with giving way and swelling. She had MRI which showed: IMPRESSION: 1. Oblique peripheral grade 3 tear of the posterior horn medial meniscus involving the inferior surface along the meniscal periphery. 2. Small degenerative inferior surface tear of the posterior horn and midbody along the free edge of the lateral meniscus. 3. Grade 2 sprain of the proximal MCL. 4. Generally moderate osteoarthritis but with full-thickness focal regions of articular cartilage loss in all 3 compartments as detailed above. 5. Moderate to large knee effusion with mild synovitis and a moderate size Baker's cyst.  I have recommended consideration of arthroscopy.  I will have her see Dr. Aline Brochure for evaluation for surgery.  She is agreeable to this. HPI  Body mass index is 39.47 kg/m.  ROS  Review of Systems  HENT: Negative for congestion.   Respiratory: Positive for shortness of breath. Negative for cough.   Cardiovascular: Negative for chest pain and leg swelling.  Endocrine: Positive for cold intolerance.  Musculoskeletal: Positive for arthralgias, gait problem and joint swelling.  Allergic/Immunologic: Positive for environmental allergies.  Psychiatric/Behavioral: The patient is nervous/anxious.     Past Medical History:  Diagnosis Date  . Anxiety   . Asthma   . Bipolar 1 disorder ([place])   . Bulging lumbar disc   . Chronic pain of left knee   . COPD (chronic obstructive pulmonary disease) ([place])   . Diabetes mellitus     Past Surgical History:  Procedure Laterality Date  . ABDOMINAL HYSTERECTOMY      No family history on file.  Social History Social History  Substance Use Topics  . Smoking status: Current Some Day Smoker    Types: Cigarettes  . Smokeless tobacco:  Never Used  . Alcohol use No    Allergies  Allergen Reactions  . Aspirin Nausea Only and Other (See Comments)    Causes stomach pain  . Ibuprofen     Stomach problems    Current Outpatient Prescriptions  Medication Sig Dispense Refill  . albuterol (PROVENTIL HFA;VENTOLIN HFA) 108 (90 BASE) MCG/ACT inhaler Inhale 2 puffs into the lungs every 6 (six) hours as needed for wheezing.    Marland Kitchen albuterol (PROVENTIL) (2.5 MG/3ML) 0.083% nebulizer solution Take 2.5 mg by nebulization every 6 (six) hours as needed for shortness of breath.     Marland Kitchen amoxicillin (AMOXIL) 500 MG capsule Take 1 capsule (500 mg total) by mouth 3 (three) times daily. 30 capsule 0  . diclofenac (VOLTAREN) 75 MG EC tablet Take 1 tablet (75 mg total) by mouth 2 (two) times daily with a meal. 60 tablet 5  . diclofenac sodium (VOLTAREN) 1 % GEL Apply 2 g topically 4 (four) times daily. 100 g 1  . divalproex (DEPAKOTE ER) 500 MG 24 hr tablet Take 1,000 mg by mouth 2 (two) times daily.     Marland Kitchen gabapentin (NEURONTIN) 300 MG capsule Take 300 mg by mouth 3 (three) times daily.    Marland Kitchen HYDROcodone-acetaminophen (NORCO/VICODIN) 5-325 MG tablet Take 1 tablet by mouth every 4 (four) hours as needed. 10 tablet 0  . insulin NPH-regular (NOVOLIN 70/30) (70-30) 100 UNIT/ML injection Inject 50 Units into the skin 2 (two) times daily with a meal.     . levofloxacin (LEVAQUIN) 500 MG tablet Take 1 tablet (500  mg total) by mouth daily. 10 tablet 0  . oxybutynin (DITROPAN) 5 MG tablet Take 5 mg by mouth 2 (two) times daily.    Marland Kitchen sulfamethoxazole-trimethoprim (BACTRIM DS,SEPTRA DS) 800-160 MG tablet Take 1 tablet by mouth 2 (two) times daily.    . traMADol (ULTRAM) 50 MG tablet 1 or 2 po q6h prn pain 20 tablet 0  . traZODone (DESYREL) 100 MG tablet Take 200 mg by mouth at bedtime.     No current facility-administered medications for this visit.      Physical Exam  Blood pressure (!) 146/86, pulse 80, height 5\' 11"  (1.803 m), weight 283 lb (128.4  kg).  Constitutional: overall normal hygiene, normal nutrition, well developed, normal grooming, normal body habitus. Assistive device:cane  Musculoskeletal: gait and station Limp right, muscle tone and strength are normal, no tremors or atrophy is present.  .  Neurological: coordination overall normal.  Deep tendon reflex/nerve stretch intact.  Sensation normal.  Cranial nerves II-XII intact.   Skin:   Normal overall no scars, lesions, ulcers or rashes. No psoriasis.  Psychiatric: Alert and oriented x 3.  Recent memory intact, remote memory unclear.  Normal mood and affect. Well groomed.  Good eye contact.  Cardiovascular: overall no swelling, no varicosities, no edema bilaterally, normal temperatures of the legs and arms, no clubbing, cyanosis and good capillary refill.  Lymphatic: palpation is normal.  The right lower extremity is examined:  Inspection:  Thigh:  Non-tender and no defects  Knee has swelling 1+ effusion.                        Joint tenderness is present                        Patient is tender over the medial joint line  Lower Leg:  Has normal appearance and no tenderness or defects  Ankle:  Non-tender and no defects  Foot:  Non-tender and no defects Range of Motion:  Knee:  Range of motion is: 0-105                        Crepitus is  present  Ankle:  Range of motion is normal. Strength and Tone:  The right lower extremity has normal strength and tone. Stability:  Knee:  The knee has positive medial McMurray.  Ankle:  The ankle is stable.    The patient has been educated about the nature of the problem(s) and counseled on treatment options.  The patient appeared to understand what I have discussed and is in agreement with it.  Encounter Diagnoses  Name Primary?  . Right knee pain Yes  . Chronic obstructive pulmonary disease, unspecified COPD type ([place])   . Tobacco smoker within last 12 months     PLAN Call if any problems.  Precautions discussed.   Continue current medications.   Return to clinic see Dr. Aline Brochure for evaluation for possible surgery.   Electronically Signed Sanjuana Kava, MD 9/12/201710:29 AM

## 2016-03-22 NOTE — Patient Instructions (Signed)
Smoking Cessation, Tips for Success If you are ready to quit smoking, congratulations! You have chosen to help yourself be healthier. Cigarettes bring nicotine, tar, carbon monoxide, and other irritants into your body. Your lungs, heart, and blood vessels will be able to work better without these poisons. There are many different ways to quit smoking. Nicotine gum, nicotine patches, a nicotine inhaler, or nicotine nasal spray can help with physical craving. Hypnosis, support groups, and medicines help break the habit of smoking. WHAT THINGS CAN I DO TO MAKE QUITTING EASIER?  Here are some tips to help you quit for good:  Pick a date when you will quit smoking completely. Tell all of your friends and family about your plan to quit on that date.  Do not try to slowly cut down on the number of cigarettes you are smoking. Pick a quit date and quit smoking completely starting on that day.  Throw away all cigarettes.   Clean and remove all ashtrays from your home, work, and car.  On a card, write down your reasons for quitting. Carry the card with you and read it when you get the urge to smoke.  Cleanse your body of nicotine. Drink enough water and fluids to keep your urine clear or pale yellow. Do this after quitting to flush the nicotine from your body.  Learn to predict your moods. Do not let a bad situation be your excuse to have a cigarette. Some situations in your life might tempt you into wanting a cigarette.  Never have "just one" cigarette. It leads to wanting another and another. Remind yourself of your decision to quit.  Change habits associated with smoking. If you smoked while driving or when feeling stressed, try other activities to replace smoking. Stand up when drinking your coffee. Brush your teeth after eating. Sit in a different chair when you read the paper. Avoid alcohol while trying to quit, and try to drink fewer caffeinated beverages. Alcohol and caffeine may urge you to  smoke.  Avoid foods and drinks that can trigger a desire to smoke, such as sugary or spicy foods and alcohol.  Ask people who smoke not to smoke around you.  Have something planned to do right after eating or having a cup of coffee. For example, plan to take a walk or exercise.  Try a relaxation exercise to calm you down and decrease your stress. Remember, you may be tense and nervous for the first 2 weeks after you quit, but this will pass.  Find new activities to keep your hands busy. Play with a pen, coin, or rubber band. Doodle or draw things on paper.  Brush your teeth right after eating. This will help cut down on the craving for the taste of tobacco after meals. You can also try mouthwash.   Use oral substitutes in place of cigarettes. Try using lemon drops, carrots, cinnamon sticks, or chewing gum. Keep them handy so they are available when you have the urge to smoke.  When you have the urge to smoke, try deep breathing.  Designate your home as a nonsmoking area.  If you are a heavy smoker, ask your health care provider about a prescription for nicotine chewing gum. It can ease your withdrawal from nicotine.  Reward yourself. Set aside the cigarette money you save and buy yourself something nice.  Look for support from others. Join a support group or smoking cessation program. Ask someone at home or at work to help you with your plan   to quit smoking.  Always ask yourself, "Do I need this cigarette or is this just a reflex?" Tell yourself, "Today, I choose not to smoke," or "I do not want to smoke." You are reminding yourself of your decision to quit.  Do not replace cigarette smoking with electronic cigarettes (commonly called e-cigarettes). The safety of e-cigarettes is unknown, and some may contain harmful chemicals.  If you relapse, do not give up! Plan ahead and think about what you will do the next time you get the urge to smoke. HOW WILL I FEEL WHEN I QUIT SMOKING? You  may have symptoms of withdrawal because your body is used to nicotine (the addictive substance in cigarettes). You may crave cigarettes, be irritable, feel very hungry, cough often, get headaches, or have difficulty concentrating. The withdrawal symptoms are only temporary. They are strongest when you first quit but will go away within 10-14 days. When withdrawal symptoms occur, stay in control. Think about your reasons for quitting. Remind yourself that these are signs that your body is healing and getting used to being without cigarettes. Remember that withdrawal symptoms are easier to treat than the major diseases that smoking can cause.  Even after the withdrawal is over, expect periodic urges to smoke. However, these cravings are generally short lived and will go away whether you smoke or not. Do not smoke! WHAT RESOURCES ARE AVAILABLE TO HELP ME QUIT SMOKING? Your health care provider can direct you to community resources or hospitals for support, which may include:  Group support.  Education.  Hypnosis.  Therapy.   This information is not intended to replace advice given to you by your health care provider. Make sure you discuss any questions you have with your health care provider.   Document Released: 03/25/2004 Document Revised: 07/18/2014 Document Reviewed: 12/13/2012 Elsevier Interactive Patient Education 2016 Elsevier Inc.  

## 2016-04-08 ENCOUNTER — Other Ambulatory Visit: Payer: Self-pay | Admitting: Orthopedic Surgery

## 2016-04-08 NOTE — Patient Instructions (Signed)
Rhonda Hickman  04/08/2016     @PREFPERIOPPHARMACY @   Your procedure is scheduled on  04/14/2016   Report to [name] at  45  A.M.  Call this number if you have problems the morning of surgery:  [phone]   Remember:  Do not eat food or drink liquids after midnight.  Take these medicines the morning of surgery with A SIP OF WATER  Depakote, gabapentin, hydrocodone, ditropan, ultram. Take your nebulizer before you come. Use your inhaler and bring it with you. Only take 1/2 of your usual night time isulin the night before your surgery.. DO NOT take any medicines for diabetes the morning of your surgery.   Do not wear jewelry, make-up or nail polish.  Do not wear lotions, powders, or perfumes, or deoderant.  Do not shave 48 hours prior to surgery.  Men may shave face and neck.  Do not bring valuables to the hospital.  [name] is not responsible for any belongings or valuables.  Contacts, dentures or bridgework may not be worn into surgery.  Leave your suitcase in the car.  After surgery it may be brought to your room.  For patients admitted to the hospital, discharge time will be determined by your treatment team.  Patients discharged the day of surgery will not be allowed to drive home.   Name and phone number of your driver:   family Special instructions:  none  Please read over the following fact sheets that you were given. Anesthesia Post-op Instructions and Care and Recovery After Surgery       Meniscus Injury, Arthroscopy Arthroscopy is a surgical procedure that involves the use of a small scope that has a camera and surgical instruments on the end (arthroscope). An arthroscope can be used to repair your meniscus injury.  LET [name] CARE PROVIDER KNOW ABOUT:  Any allergies you have.  All medicines you are taking, including vitamins, herbs, eyedrops, creams, and over-the-counter medicines.  Any recent colds or infections you have had or  currently have.  Previous problems you or members of your family have had with the use of anesthetics.  Any blood disorders or blood clotting problems you have.  Previous surgeries you have had.  Medical conditions you have. RISKS AND COMPLICATIONS Generally, this is a safe procedure. However, as with any procedure, problems can occur. Possible problems include:  Damage to nerves or blood vessels.  Excess bleeding.  Blood clots.  Infection. BEFORE THE PROCEDURE  Do not eat or drink for 6-8 hours before the procedure.  Take medicines as directed by your surgeon. Ask your surgeon about changing or stopping your regular medicines.  You may have lab tests the morning of surgery. PROCEDURE  You will be given one of the following:   A medicine that numbs the area (local anesthesia).  A medicine that makes you go to sleep (general anesthesia).  A medicine injected into your spine that numbs your body below the waist (spinal anesthesia). Most often, several small cuts (incisions) are made in the knee. The arthroscope and instruments go into the incisions to repair the damage. The torn portion of the meniscus is removed.  During this time, your surgeon may find a partial or complete tear in a cruciate ligament, such as the anterior cruciate ligament (ACL). A completely torn cruciate ligament is reconstructed by taking tissue from another part of the body (grafting) and placing it into the injured area. This requires several  larger incisions to complete the repair. Sometimes, open surgery is needed for collateral ligament injuries. If a collateral ligament is found to be injured, your surgeon may staple or suture the tear through a slightly larger incision on the side of the knee. AFTER THE PROCEDURE You will be taken to the recovery area where your progress will be monitored. When you are awake, stable, and taking fluids without complications, you will be allowed to go home. This is  usually the same day. However, more extensive repairs of a ligament may require an overnight stay.  The recovery time after repairing your meniscus or ligament depends on the amount of damage to these structures. It also depends on whether or not reconstructive knee surgery was needed.   A torn or stretched ligament (ligament sprain) may take 6-8 weeks to heal. It takes about the same amount of time if your surgeon removed a torn meniscus.  A repaired meniscus may require 6-12 weeks of recovery time.  A torn ligament needing reconstructive surgery may take 6-12 months to heal fully.   This information is not intended to replace advice given to you by your health care provider. Make sure you discuss any questions you have with your health care provider.   Document Released: 06/24/2000 Document Revised: 07/02/2013 Document Reviewed: 11/23/2012 Elsevier Interactive Patient Education 2016 [name].  Arthroscopy, With Meniscus Injury, Care After Refer to this sheet in the next few weeks. These instructions provide you with general information on caring for yourself after your procedure. Your health care provider may also give you specific instructions. Your treatment has been planned according to the current medical practices, but problems sometimes occur. Call your health care provider if you have any problems or questions after your procedure. WHAT TO EXPECT AFTER THE PROCEDURE After your procedure, it is typical to have the following:  Pain and swelling in your knee.  Constipation.  Difficulty walking. HOME CARE INSTRUCTIONS   Use crutches and do knee exercises as directed by your health care provider.  Apply ice to the injured area:  Put ice in a plastic bag.  Place a towel between your skin and the bag.  Leave the ice on for 15-20 minutes, 3-4 times a day while awake. Do this for the first 2 days.  Rest and raise (elevate) your knee.  Change bandages (dressings) as directed  by your health care provider.  Keep the wound dry and clean. The wound may be washed gently with soap and water. Gently blot or dab the wound dry. It is okay to take showers 24-48 hours after surgery. Do not take baths, use swimming pools, or use hot tubs for 14 days, or as directed by your health care provider.  Only take over-the-counter or prescription medicines for pain, discomfort, or fever as directed by your health care provider.  Continue your normal diet as directed by your health care provider.  Do not lift anything more than 10 pounds or play contact sports for 3 weeks, or as directed by your health care provider.  If a brace was applied, use as directed by your health care provider.  Your health care provider will help with instructions for rehabilitation of your knee. SEEK MEDICAL CARE IF:   You have increased bleeding (more than a small spot) from the wound.  You have redness, swelling, or increasing pain in the wound.  Yellowish-white fluid (pus) is coming from your wound. SEEK IMMEDIATE MEDICAL CARE IF:   You develop a rash.  You have a fever or persistent symptoms for more than 2-3 days.  You have difficulty breathing.  You have increasing pain with movement of the knee. MAKE SURE YOU:   Understand these instructions.  Will watch your condition.  Will get help right away if you are not doing well or get worse.   This information is not intended to replace advice given to you by your health care provider. Make sure you discuss any questions you have with your health care provider.   Document Released: 01/14/2005 Document Revised: 02/27/2013 Document Reviewed: 12/04/2012 Elsevier Interactive Patient Education 2016 [employer] PATIENT INSTRUCTIONS POST-ANESTHESIA  IMMEDIATELY FOLLOWING SURGERY:  Do not drive or operate machinery for the first twenty four hours after surgery.  Do not make any important decisions for twenty four hours after surgery or while  taking narcotic pain medications or sedatives.  If you develop intractable nausea and vomiting or a severe headache please notify your doctor immediately.  FOLLOW-UP:  Please make an appointment with your surgeon as instructed. You do not need to follow up with anesthesia unless specifically instructed to do so.  WOUND CARE INSTRUCTIONS (if applicable):  Keep a dry clean dressing on the anesthesia/puncture wound site if there is drainage.  Once the wound has quit draining you may leave it open to air.  Generally you should leave the bandage intact for twenty four hours unless there is drainage.  If the epidural site drains for more than 36-48 hours please call the anesthesia department.  QUESTIONS?:  Please feel free to call your physician or the hospital operator if you have any questions, and they will be happy to assist you.

## 2016-04-11 ENCOUNTER — Encounter (HOSPITAL_COMMUNITY): Payer: Self-pay

## 2016-04-11 ENCOUNTER — Encounter (HOSPITAL_COMMUNITY)
Admission: RE | Admit: 2016-04-11 | Discharge: 2016-04-11 | Disposition: A | Discharge status: Home / Self Care | Payer: Self-pay | Source: Ambulatory Visit | Attending: Orthopedic Surgery | Admitting: Orthopedic Surgery

## 2016-04-11 DIAGNOSIS — F17210 Nicotine dependence, cigarettes, uncomplicated: Secondary | ICD-10-CM | POA: Insufficient documentation

## 2016-04-11 DIAGNOSIS — J449 Chronic obstructive pulmonary disease, unspecified: Secondary | ICD-10-CM | POA: Insufficient documentation

## 2016-04-11 DIAGNOSIS — Z01812 Encounter for preprocedural laboratory examination: Secondary | ICD-10-CM | POA: Insufficient documentation

## 2016-04-11 DIAGNOSIS — M25561 Pain in right knee: Secondary | ICD-10-CM | POA: Insufficient documentation

## 2016-04-11 LAB — CBC WITH DIFFERENTIAL/PLATELET
BASOS ABS: 0 10*3/uL (ref 0.0–0.1)
Basophils Relative: 0 %
Eosinophils Absolute: 0.1 10*3/uL (ref 0.0–0.7)
Eosinophils Relative: 1 %
HEMATOCRIT: 40.1 % (ref 36.0–46.0)
Hemoglobin: 13 g/dL (ref 12.0–15.0)
LYMPHS ABS: 3 10*3/uL (ref 0.7–4.0)
LYMPHS PCT: 46 %
MCH: 28.4 pg (ref 26.0–34.0)
MCHC: 32.4 g/dL (ref 30.0–36.0)
MCV: 87.6 fL (ref 78.0–100.0)
MONO ABS: 0.5 10*3/uL (ref 0.1–1.0)
Monocytes Relative: 8 %
NEUTROS ABS: 2.9 10*3/uL (ref 1.7–7.7)
Neutrophils Relative %: 45 %
Platelets: 269 10*3/uL (ref 150–400)
RBC: 4.58 MIL/uL (ref 3.87–5.11)
RDW: 14.1 % (ref 11.5–15.5)
WBC: 6.6 10*3/uL (ref 4.0–10.5)

## 2016-04-11 LAB — BASIC METABOLIC PANEL
Anion gap: 6 (ref 5–15)
BUN: 8 mg/dL (ref 6–20)
CALCIUM: 9.1 mg/dL (ref 8.9–10.3)
CO2: 28 mmol/L (ref 22–32)
CREATININE: 0.86 mg/dL (ref 0.44–1.00)
Chloride: 105 mmol/L (ref 101–111)
GFR calc Af Amer: >60 mL/min (ref >60)
GLUCOSE: 149 mg/dL — AB (ref 65–99)
Potassium: 3.8 mmol/L (ref 3.5–5.1)
Sodium: 139 mmol/L (ref 135–145)

## 2016-04-13 NOTE — H&P (Signed)
Chief Complaint  Patient presents with  . Follow-up      right knee pain     Dr. Luna Glasgow history and physical has been reviewed and agree with his assessment and plan and findings HPI   Rhonda Hickman is a 60 y.o. female who has right knee pain with giving way and swelling. She had MRI which showed: IMPRESSION: 1. Oblique peripheral grade 3 tear of the posterior horn medial meniscus involving the inferior surface along the meniscal periphery. 2. Small degenerative inferior surface tear of the posterior horn and midbody along the free edge of the lateral meniscus. 3. Grade 2 sprain of the proximal MCL. 4. Generally moderate osteoarthritis but with full-thickness focal regions of articular cartilage loss in all 3 compartments as detailed above. 5. Moderate to large knee effusion with mild synovitis and a moderate size Baker's cyst.   I have recommended consideration of arthroscopy.  I will have her see Dr. Aline Brochure for evaluation for surgery.  She is agreeable to this. HPI   Body mass index is 39.47 kg/m.   ROS   Review of Systems  HENT: Negative for congestion.   Respiratory: Positive for shortness of breath. Negative for cough.   Cardiovascular: Negative for chest pain and leg swelling.  Endocrine: Positive for cold intolerance.  Musculoskeletal: Positive for arthralgias, gait problem and joint swelling.  Allergic/Immunologic: Positive for environmental allergies.  Psychiatric/Behavioral: The patient is nervous/anxious.           Past Medical History:  Diagnosis Date  . Anxiety    . Asthma    . Bipolar 1 disorder ([place])    . Bulging lumbar disc    . Chronic pain of left knee    . COPD (chronic obstructive pulmonary disease) ([place])    . Diabetes mellitus             Past Surgical History:  Procedure Laterality Date  . ABDOMINAL HYSTERECTOMY          Denies family history of hypertension diabetes or coronary artery disease   Social History      Social  History  Substance Use Topics  . Smoking status: Current Some Day Smoker      Types: Cigarettes  . Smokeless tobacco: Never Used  . Alcohol use No           Allergies  Allergen Reactions  . Aspirin Nausea Only and Other (See Comments)      Causes stomach pain  . Ibuprofen        Stomach problems      Current Outpatient Prescriptions  Medication Sig Dispense Refill  . albuterol (PROVENTIL HFA;VENTOLIN HFA) 108 (90 BASE) MCG/ACT inhaler Inhale 2 puffs into the lungs every 6 (six) hours as needed for wheezing.      Marland Kitchen albuterol (PROVENTIL) (2.5 MG/3ML) 0.083% nebulizer solution Take 2.5 mg by nebulization every 6 (six) hours as needed for shortness of breath.       Marland Kitchen amoxicillin (AMOXIL) 500 MG capsule Take 1 capsule (500 mg total) by mouth 3 (three) times daily. 30 capsule 0  . diclofenac (VOLTAREN) 75 MG EC tablet Take 1 tablet (75 mg total) by mouth 2 (two) times daily with a meal. 60 tablet 5  . diclofenac sodium (VOLTAREN) 1 % GEL Apply 2 g topically 4 (four) times daily. 100 g 1  . divalproex (DEPAKOTE ER) 500 MG 24 hr tablet Take 1,000 mg by mouth 2 (two) times daily.       Marland Kitchen gabapentin (  NEURONTIN) 300 MG capsule Take 300 mg by mouth 3 (three) times daily.      Marland Kitchen HYDROcodone-acetaminophen (NORCO/VICODIN) 5-325 MG tablet Take 1 tablet by mouth every 4 (four) hours as needed. 10 tablet 0  . insulin NPH-regular (NOVOLIN 70/30) (70-30) 100 UNIT/ML injection Inject 50 Units into the skin 2 (two) times daily with a meal.       . levofloxacin (LEVAQUIN) 500 MG tablet Take 1 tablet (500 mg total) by mouth daily. 10 tablet 0  . oxybutynin (DITROPAN) 5 MG tablet Take 5 mg by mouth 2 (two) times daily.      Marland Kitchen sulfamethoxazole-trimethoprim (BACTRIM DS,SEPTRA DS) 800-160 MG tablet Take 1 tablet by mouth 2 (two) times daily.      . traMADol (ULTRAM) 50 MG tablet 1 or 2 po q6h prn pain 20 tablet 0  . traZODone (DESYREL) 100 MG tablet Take 200 mg by mouth at bedtime.        No current  facility-administered medications for this visit.         Physical Exam   Blood pressure (!) 146/86, pulse 80, height 5\' 11"  (1.803 m), weight 283 lb (128.4 kg).   Constitutional: overall normal hygiene, normal nutrition, well developed, normal grooming, normal body habitus. Assistive device:cane   Musculoskeletal: gait and station Limp right, muscle tone and strength are normal, no tremors or atrophy is present.  .  Neurological: coordination overall normal.  Deep tendon reflex/nerve stretch intact.  Sensation normal.  Cranial nerves II-XII intact.    Skin:   Normal overall no scars, lesions, ulcers or rashes. No psoriasis.   Psychiatric: Alert and oriented x 3.  Recent memory intact, remote memory unclear.  Normal mood and affect. Well groomed.  Good eye contact.   Cardiovascular: overall no swelling, no varicosities, no edema bilaterally, normal temperatures of the legs and arms, no clubbing, cyanosis and good capillary refill.   Lymphatic: palpation is normal.   The right lower extremity is examined:   Inspection:             Thigh:  Non-tender and no defects             Knee has swelling 1+ effusion.                        Joint tenderness is present                        Patient is tender over the medial joint line             Lower Leg:  Has normal appearance and no tenderness or defects             Ankle:  Non-tender and no defects             Foot:  Non-tender and no defects Range of Motion:             Knee:  Range of motion is: 0-105                        Crepitus is  present             Ankle:  Range of motion is normal. Strength and Tone:             The right lower extremity has normal strength and tone. Stability:  Knee:  The knee has positive medial McMurray.             Ankle:  The ankle is stable.       The patient has been educated about the nature of the problem(s) and counseled on treatment options.  The patient appeared to understand  what I have discussed and is in agreement with it.       Encounter Diagnoses  Name Primary?  . Right knee pain Yes  . Chronic obstructive pulmonary disease, unspecified COPD type ([place])    . Tobacco smoker within last 12 months        PLAN I discussed this with her in the office and she wants to proceed with arthroscopy of the right knee with a lateral and medial meniscectomy understanding that she has arthritis and will have chronic pain in this knee  Arthroscopy right knee lateral and medial meniscectomy

## 2016-04-14 ENCOUNTER — Ambulatory Visit (HOSPITAL_COMMUNITY): Payer: Self-pay | Admitting: Anesthesiology

## 2016-04-14 ENCOUNTER — Encounter (HOSPITAL_COMMUNITY): Payer: Self-pay

## 2016-04-14 ENCOUNTER — Encounter (HOSPITAL_COMMUNITY): Payer: Self-pay | Admitting: Emergency Medicine

## 2016-04-14 ENCOUNTER — Ambulatory Visit (HOSPITAL_COMMUNITY)
Admission: RE | Admit: 2016-04-14 | Discharge: 2016-04-14 | Disposition: A | Discharge status: Home / Self Care | Payer: Self-pay | Source: Ambulatory Visit | Attending: Orthopedic Surgery | Admitting: Orthopedic Surgery

## 2016-04-14 ENCOUNTER — Encounter (HOSPITAL_COMMUNITY)
Admission: RE | Disposition: A | Discharge status: Home / Self Care | Payer: Self-pay | Source: Ambulatory Visit | Attending: Orthopedic Surgery

## 2016-04-14 ENCOUNTER — Emergency Department (HOSPITAL_COMMUNITY)
Admission: EM | Admit: 2016-04-14 | Discharge: 2016-04-14 | Disposition: A | Discharge status: Home / Self Care | Payer: Self-pay | Attending: Emergency Medicine | Admitting: Emergency Medicine

## 2016-04-14 DIAGNOSIS — F319 Bipolar disorder, unspecified: Secondary | ICD-10-CM | POA: Insufficient documentation

## 2016-04-14 DIAGNOSIS — Z794 Long term (current) use of insulin: Secondary | ICD-10-CM | POA: Insufficient documentation

## 2016-04-14 DIAGNOSIS — M23351 Other meniscus derangements, posterior horn of lateral meniscus, right knee: Secondary | ICD-10-CM | POA: Insufficient documentation

## 2016-04-14 DIAGNOSIS — Z9889 Other specified postprocedural states: Secondary | ICD-10-CM

## 2016-04-14 DIAGNOSIS — M1711 Unilateral primary osteoarthritis, right knee: Secondary | ICD-10-CM

## 2016-04-14 DIAGNOSIS — J449 Chronic obstructive pulmonary disease, unspecified: Secondary | ICD-10-CM | POA: Insufficient documentation

## 2016-04-14 DIAGNOSIS — J45909 Unspecified asthma, uncomplicated: Secondary | ICD-10-CM | POA: Insufficient documentation

## 2016-04-14 DIAGNOSIS — M94261 Chondromalacia, right knee: Secondary | ICD-10-CM | POA: Insufficient documentation

## 2016-04-14 DIAGNOSIS — M23321 Other meniscus derangements, posterior horn of medial meniscus, right knee: Secondary | ICD-10-CM

## 2016-04-14 DIAGNOSIS — F419 Anxiety disorder, unspecified: Secondary | ICD-10-CM | POA: Insufficient documentation

## 2016-04-14 DIAGNOSIS — Z792 Long term (current) use of antibiotics: Secondary | ICD-10-CM | POA: Insufficient documentation

## 2016-04-14 DIAGNOSIS — F17200 Nicotine dependence, unspecified, uncomplicated: Secondary | ICD-10-CM | POA: Insufficient documentation

## 2016-04-14 DIAGNOSIS — F17210 Nicotine dependence, cigarettes, uncomplicated: Secondary | ICD-10-CM | POA: Insufficient documentation

## 2016-04-14 DIAGNOSIS — Z79899 Other long term (current) drug therapy: Secondary | ICD-10-CM | POA: Insufficient documentation

## 2016-04-14 DIAGNOSIS — E119 Type 2 diabetes mellitus without complications: Secondary | ICD-10-CM | POA: Insufficient documentation

## 2016-04-14 DIAGNOSIS — M23300 Other meniscus derangements, unspecified lateral meniscus, right knee: Secondary | ICD-10-CM

## 2016-04-14 DIAGNOSIS — F1490 Cocaine use, unspecified, uncomplicated: Secondary | ICD-10-CM | POA: Insufficient documentation

## 2016-04-14 DIAGNOSIS — M25561 Pain in right knee: Secondary | ICD-10-CM | POA: Insufficient documentation

## 2016-04-14 HISTORY — PX: KNEE ARTHROSCOPY WITH MEDIAL MENISECTOMY: SHX5651

## 2016-04-14 LAB — GLUCOSE, CAPILLARY
Glucose-Capillary: 211 mg/dL — ABNORMAL HIGH (ref 65–99)
Glucose-Capillary: 230 mg/dL — ABNORMAL HIGH (ref 65–99)

## 2016-04-14 SURGERY — ARTHROSCOPY, KNEE, WITH MEDIAL MENISCECTOMY
Anesthesia: General | Site: Knee | Laterality: Right

## 2016-04-14 MED ORDER — MIDAZOLAM HCL 2 MG/2ML IJ SOLN
INTRAMUSCULAR | Status: AC
  Filled 2016-04-14: qty 2

## 2016-04-14 MED ORDER — PROMETHAZINE HCL 12.5 MG RE SUPP: 12.5000 mg | Freq: Four times a day (QID) | RECTAL | 0 refills | Status: DC | PRN

## 2016-04-14 MED ORDER — LIDOCAINE HCL (PF) 1 % IJ SOLN
INTRAMUSCULAR | Status: AC
  Filled 2016-04-14: qty 5

## 2016-04-14 MED ORDER — CEFAZOLIN SODIUM-DEXTROSE 2-4 GM/100ML-% IV SOLN
2.0000 g | INTRAVENOUS | Status: AC
  Administered 2016-04-14: 2 g via INTRAVENOUS

## 2016-04-14 MED ORDER — BUPIVACAINE-EPINEPHRINE (PF) 0.5% -1:200000 IJ SOLN
INTRAMUSCULAR | Status: AC
  Filled 2016-04-14: qty 60

## 2016-04-14 MED ORDER — PROPOFOL 10 MG/ML IV BOLUS
INTRAVENOUS | Status: AC
  Filled 2016-04-14: qty 20

## 2016-04-14 MED ORDER — ROCURONIUM BROMIDE 50 MG/5ML IV SOLN
INTRAVENOUS | Status: AC
  Filled 2016-04-14: qty 1

## 2016-04-14 MED ORDER — OXYCODONE-ACETAMINOPHEN 10-325 MG PO TABS: 1.0000 | ORAL_TABLET | ORAL | 0 refills | Status: DC | PRN

## 2016-04-14 MED ORDER — HYDROMORPHONE HCL 1 MG/ML IJ SOLN
0.2500 mg | INTRAMUSCULAR | Status: DC | PRN
  Administered 2016-04-14 (×2): 0.5 mg via INTRAVENOUS

## 2016-04-14 MED ORDER — FENTANYL CITRATE (PF) 100 MCG/2ML IJ SOLN
INTRAMUSCULAR | Status: DC | PRN
Start: 2016-04-14 — End: 2016-04-14
  Administered 2016-04-14: 50 ug via INTRAVENOUS
  Administered 2016-04-14: 100 ug via INTRAVENOUS
  Administered 2016-04-14: 50 ug via INTRAVENOUS

## 2016-04-14 MED ORDER — ROCURONIUM BROMIDE 100 MG/10ML IV SOLN
INTRAVENOUS | Status: DC | PRN
Start: 2016-04-14 — End: 2016-04-14
  Administered 2016-04-14: 5 mg via INTRAVENOUS
  Administered 2016-04-14: 35 mg via INTRAVENOUS

## 2016-04-14 MED ORDER — NEOSTIGMINE METHYLSULFATE 10 MG/10ML IV SOLN
INTRAVENOUS | Status: AC
  Filled 2016-04-14: qty 1

## 2016-04-14 MED ORDER — GLYCOPYRROLATE 0.2 MG/ML IJ SOLN
INTRAMUSCULAR | Status: AC
  Filled 2016-04-14: qty 3

## 2016-04-14 MED ORDER — SODIUM CHLORIDE 0.9 % IR SOLN
Status: DC | PRN
  Administered 2016-04-14 (×5): 3000 mL

## 2016-04-14 MED ORDER — SODIUM CHLORIDE 0.9 % IJ SOLN
INTRAMUSCULAR | Status: AC
  Filled 2016-04-14: qty 10

## 2016-04-14 MED ORDER — DEXAMETHASONE SODIUM PHOSPHATE 4 MG/ML IJ SOLN
INTRAMUSCULAR | Status: AC
  Filled 2016-04-14: qty 1

## 2016-04-14 MED ORDER — PROPOFOL 10 MG/ML IV BOLUS
INTRAVENOUS | Status: DC | PRN
  Administered 2016-04-14: 150 mg via INTRAVENOUS
  Administered 2016-04-14: 20 mg via INTRAVENOUS

## 2016-04-14 MED ORDER — FENTANYL CITRATE (PF) 100 MCG/2ML IJ SOLN
INTRAMUSCULAR | Status: AC
Start: 2016-04-14 — End: 2016-04-14
  Filled 2016-04-14: qty 2

## 2016-04-14 MED ORDER — ONDANSETRON HCL 4 MG/2ML IJ SOLN
4.0000 mg | Freq: Once | INTRAMUSCULAR | Status: AC
  Administered 2016-04-14: 4 mg via INTRAVENOUS
  Filled 2016-04-14: qty 2

## 2016-04-14 MED ORDER — OXYCODONE-ACETAMINOPHEN 5-325 MG PO TABS
ORAL_TABLET | ORAL | Status: AC
  Filled 2016-04-14: qty 1

## 2016-04-14 MED ORDER — HYDROMORPHONE HCL 1 MG/ML IJ SOLN
INTRAMUSCULAR | Status: AC
  Filled 2016-04-14: qty 1

## 2016-04-14 MED ORDER — NEOSTIGMINE METHYLSULFATE 10 MG/10ML IV SOLN
INTRAVENOUS | Status: DC | PRN
  Administered 2016-04-14 (×2): 2 mg via INTRAVENOUS

## 2016-04-14 MED ORDER — ONDANSETRON 4 MG PO TBDP
ORAL_TABLET | ORAL | Status: AC
  Filled 2016-04-14: qty 1

## 2016-04-14 MED ORDER — ACETAMINOPHEN 500 MG PO TABS
1000.0000 mg | ORAL_TABLET | Freq: Once | ORAL | Status: AC
  Administered 2016-04-14: 1000 mg via ORAL
  Filled 2016-04-14: qty 2

## 2016-04-14 MED ORDER — CHLORHEXIDINE GLUCONATE 4 % EX LIQD: 60.0000 mL | Freq: Once | CUTANEOUS | Status: DC

## 2016-04-14 MED ORDER — PREGABALIN 50 MG PO CAPS
50.0000 mg | ORAL_CAPSULE | Freq: Once | ORAL | Status: AC
  Administered 2016-04-14: 50 mg via ORAL
  Filled 2016-04-14: qty 1

## 2016-04-14 MED ORDER — EPHEDRINE SULFATE 50 MG/ML IJ SOLN
INTRAMUSCULAR | Status: AC
Start: 2016-04-14 — End: 2016-04-14
  Filled 2016-04-14: qty 1

## 2016-04-14 MED ORDER — EPINEPHRINE HCL 1 MG/ML IJ SOLN
INTRAMUSCULAR | Status: AC
  Filled 2016-04-14: qty 5

## 2016-04-14 MED ORDER — GLYCOPYRROLATE 0.2 MG/ML IJ SOLN
INTRAMUSCULAR | Status: DC | PRN
  Administered 2016-04-14: 0.6 mg via INTRAVENOUS

## 2016-04-14 MED ORDER — BUPIVACAINE-EPINEPHRINE (PF) 0.5% -1:200000 IJ SOLN
INTRAMUSCULAR | Status: DC | PRN
  Administered 2016-04-14: 60 mL via PERINEURAL

## 2016-04-14 MED ORDER — CEFAZOLIN SODIUM-DEXTROSE 2-4 GM/100ML-% IV SOLN
INTRAVENOUS | Status: AC
  Filled 2016-04-14: qty 100

## 2016-04-14 MED ORDER — LACTATED RINGERS IV SOLN
INTRAVENOUS | Status: DC
  Administered 2016-04-14: 1000 mL via INTRAVENOUS

## 2016-04-14 MED ORDER — LIDOCAINE HCL 1 % IJ SOLN
INTRAMUSCULAR | Status: DC | PRN
Start: 2016-04-14 — End: 2016-04-14
  Administered 2016-04-14: 30 mg via INTRADERMAL

## 2016-04-14 MED ORDER — FENTANYL CITRATE (PF) 100 MCG/2ML IJ SOLN
INTRAMUSCULAR | Status: AC
  Filled 2016-04-14: qty 2

## 2016-04-14 MED ORDER — MIDAZOLAM HCL 2 MG/2ML IJ SOLN
1.0000 mg | INTRAMUSCULAR | Status: DC | PRN
  Administered 2016-04-14: 2 mg via INTRAVENOUS

## 2016-04-14 MED ORDER — DEXAMETHASONE SODIUM PHOSPHATE 4 MG/ML IJ SOLN
4.0000 mg | Freq: Once | INTRAMUSCULAR | Status: AC
  Administered 2016-04-14: 4 mg via INTRAVENOUS

## 2016-04-14 MED ORDER — EPINEPHRINE HCL 1 MG/ML IJ SOLN
INTRAMUSCULAR | Status: AC
Start: 2016-04-14 — End: 2016-04-14
  Filled 2016-04-14: qty 3

## 2016-04-14 MED ORDER — OXYCODONE HCL 5 MG PO TABS
5.0000 mg | ORAL_TABLET | Freq: Once | ORAL | Status: AC
  Administered 2016-04-14: 5 mg via ORAL
  Filled 2016-04-14: qty 1

## 2016-04-14 MED ORDER — MIDAZOLAM HCL 5 MG/5ML IJ SOLN
INTRAMUSCULAR | Status: DC | PRN
  Administered 2016-04-14: 2 mg via INTRAVENOUS

## 2016-04-14 SURGICAL SUPPLY — 57 items
ARTHROWAND PARAGON T2 (SURGICAL WAND)
BAG HAMPER (MISCELLANEOUS) ×3 IMPLANT
BANDAGE ELASTIC 6 LF NS (GAUZE/BANDAGES/DRESSINGS) ×2 IMPLANT
BANDAGE ELASTIC 6 VELCRO NS (GAUZE/BANDAGES/DRESSINGS) ×3 IMPLANT
BIT DRILL 2 QC (BIT) ×2
BIT DRILL MR QC 2 (BIT) ×1 IMPLANT
BLADE AGGRESSIVE PLUS 4.0 (BLADE) ×3 IMPLANT
BLADE SURG SZ11 CARB STEEL (BLADE) ×3 IMPLANT
BNDG CMPR MED 5X6 ELC HKLP NS (GAUZE/BANDAGES/DRESSINGS) ×1
CHLORAPREP W/TINT 26ML (MISCELLANEOUS) ×4 IMPLANT
CLOTH BEACON ORANGE TIMEOUT ST (SAFETY) ×3 IMPLANT
COOLER CRYO IC GRAV AND TUBE (ORTHOPEDIC SUPPLIES) ×3 IMPLANT
CUFF CRYO KNEE LG 20X31 COOLER (ORTHOPEDIC SUPPLIES) IMPLANT
CUFF CRYO KNEE18X23 MED (MISCELLANEOUS) ×3 IMPLANT
CUFF TOURNIQUET SINGLE 34IN LL (TOURNIQUET CUFF) ×3 IMPLANT
CUTTER ANGLED DBL BITE 4.5 (BURR) IMPLANT
DECANTER SPIKE VIAL GLASS SM (MISCELLANEOUS) ×6 IMPLANT
GAUZE SPONGE 4X4 12PLY STRL (GAUZE/BANDAGES/DRESSINGS) ×3 IMPLANT
GAUZE SPONGE 4X4 16PLY XRAY LF (GAUZE/BANDAGES/DRESSINGS) ×3 IMPLANT
GAUZE XEROFORM 5X9 LF (GAUZE/BANDAGES/DRESSINGS) ×3 IMPLANT
GLOVE BIOGEL PI IND STRL 7.0 (GLOVE) ×1 IMPLANT
GLOVE BIOGEL PI INDICATOR 7.0 (GLOVE) ×2
GLOVE SKINSENSE NS SZ8.0 LF (GLOVE) ×2
GLOVE SKINSENSE STRL SZ8.0 LF (GLOVE) ×1 IMPLANT
GLOVE SS N UNI LF 8.5 STRL (GLOVE) ×3 IMPLANT
GOWN STRL REUS W/ TWL LRG LVL3 (GOWN DISPOSABLE) ×1 IMPLANT
GOWN STRL REUS W/TWL LRG LVL3 (GOWN DISPOSABLE) ×3
GOWN STRL REUS W/TWL XL LVL3 (GOWN DISPOSABLE) ×3 IMPLANT
HLDR LEG FOAM (MISCELLANEOUS) ×1 IMPLANT
IV NS IRRIG 3000ML ARTHROMATIC (IV SOLUTION) ×12 IMPLANT
KIT BLADEGUARD II DBL (SET/KITS/TRAYS/PACK) ×3 IMPLANT
KIT ROOM TURNOVER AP CYSTO (KITS) ×3 IMPLANT
LEG HOLDER FOAM (MISCELLANEOUS) ×2
MANIFOLD NEPTUNE II (INSTRUMENTS) ×3 IMPLANT
MARKER SKIN DUAL TIP RULER LAB (MISCELLANEOUS) ×3 IMPLANT
NDL HYPO 18GX1.5 BLUNT FILL (NEEDLE) ×1 IMPLANT
NDL HYPO 21X1.5 SAFETY (NEEDLE) ×1 IMPLANT
NDL SPNL 18GX3.5 QUINCKE PK (NEEDLE) ×1 IMPLANT
NEEDLE HYPO 18GX1.5 BLUNT FILL (NEEDLE) ×3 IMPLANT
NEEDLE HYPO 21X1.5 SAFETY (NEEDLE) ×3 IMPLANT
NEEDLE SPNL 18GX3.5 QUINCKE PK (NEEDLE) ×3 IMPLANT
NS IRRIG 1000ML POUR BTL (IV SOLUTION) ×3 IMPLANT
PACK ARTHRO LIMB DRAPE STRL (MISCELLANEOUS) ×3 IMPLANT
PAD ABD 5X9 TENDERSORB (GAUZE/BANDAGES/DRESSINGS) ×3 IMPLANT
PAD ARMBOARD 7.5X6 YLW CONV (MISCELLANEOUS) ×3 IMPLANT
PADDING CAST COTTON 6X4 STRL (CAST SUPPLIES) ×3 IMPLANT
SET ARTHROSCOPY INST (INSTRUMENTS) ×3 IMPLANT
SET ARTHROSCOPY PUMP TUBE (IRRIGATION / IRRIGATOR) ×3 IMPLANT
SET BASIN LINEN APH (SET/KITS/TRAYS/PACK) ×3 IMPLANT
SUT ETHILON 3 0 FSL (SUTURE) ×3 IMPLANT
SYR 30ML LL (SYRINGE) ×3 IMPLANT
SYRINGE 10CC LL (SYRINGE) ×3 IMPLANT
TUBE CONNECTING 12'X1/4 (SUCTIONS) ×3
TUBE CONNECTING 12X1/4 (SUCTIONS) ×6 IMPLANT
WAND 50 DEG COVAC W/CORD (SURGICAL WAND) IMPLANT
WAND 90 DEG TURBOVAC W/CORD (SURGICAL WAND) ×2 IMPLANT
WAND ARTHRO PARAGON T2 (SURGICAL WAND) IMPLANT

## 2016-04-14 NOTE — ED Triage Notes (Signed)
Pt states she had right knee surgery this am and has not been keeping it elevated. Pt bandaged is saturated with blood. Pt states she went to [facility] to get bandages, but was told to come here.

## 2016-04-14 NOTE — Anesthesia Preprocedure Evaluation (Signed)
Anesthesia Evaluation  Patient identified by MRN, date of birth, ID band Patient awake    Reviewed: Allergy & Precautions, NPO status , Patient's Chart, lab work & pertinent test results  Airway Mallampati: II  TM Distance: >3 FB     Dental  (+) Poor Dentition, Dental Advisory Given   Pulmonary asthma , COPD, Current Smoker,    breath sounds clear to auscultation       Cardiovascular negative cardio ROS   Rhythm:Regular Rate:Normal     Neuro/Psych PSYCHIATRIC DISORDERS Anxiety Bipolar Disorder    GI/Hepatic negative GI ROS, (+)     substance abuse  cocaine use,   Endo/Other  diabetes, Type 2  Renal/GU      Musculoskeletal   Abdominal   Peds  Hematology   Anesthesia Other Findings   Reproductive/Obstetrics                             Anesthesia Physical Anesthesia Plan  ASA: III  Anesthesia Plan: General   Post-op Pain Management:    Induction: Intravenous  Airway Management Planned: Oral ETT  Additional Equipment:   Intra-op Plan:   Post-operative Plan: Extubation in OR  Informed Consent: I have reviewed the patients History and Physical, chart, labs and discussed the procedure including the risks, benefits and alternatives for the proposed anesthesia with the patient or authorized representative who has indicated his/her understanding and acceptance.     Plan Discussed with:   Anesthesia Plan Comments:         Anesthesia Quick Evaluation

## 2016-04-14 NOTE — Brief Op Note (Signed)
04/14/2016  9:08 AM  PATIENT:  Rhonda Hickman  60 y.o. female  PRE-OPERATIVE DIAGNOSIS:  right lateral and medial meniscus tears  POST-OPERATIVE DIAGNOSIS:  right lateral and medial meniscus tears and arthritis  PROCEDURE:  Procedure(s) with comments: KNEE ARTHROSCOPY WITH MEDIAL AND LATERAL MENISECTOMY, MICROFRACTURE REPAIR (Right)  - needs crutch training  FINDINGS:  Medial compartment we saw grade 4 chondral lesion measuring 20 x 6 over the medial femoral condyle with a posterior horn medial meniscal tear  Notch normal anterior cruciate ligament PCL  Patellofemoral joint mild chondromalacia grade 1 medial and lateral facet  Lateral compartment grade 2 chondral lesion lateral tibial plateau with a posterior horn lateral meniscal tear  Details of procedure  Initial evaluation occurred in the preop area the patient was identified as Rhonda Hickman using appropriate identifying mechanisms. Chart review was completed surgical site confirmed site marked as right knee.  She was then taken to the operating room and given general anesthesia which was successful without complication.  In the supine position the right knee and leg from groin to ankle was prepped with ChloraPrep. This was followed by sterile draping technique.  Timeout was completed.  I made a lateral portal and placed a scope in the joint. I did a diagnostic arthroscopy completely visualizing the joint starting in the medial compartment coming across the notch into the lateral compartment both medial and lateral gutters as well as the patellofemoral joint and suprapatellar pouch.  I made a medial portal and did a medial meniscectomy using a biter shaver and ArthroCare wand.  I debrided the chondral lesion and measured to be 20 mm long by 6 mm wide.  I then resected the torn medial meniscus at the posterior horn and shave the free edge tears of the body and lateral meniscus anterior horn. I confirmed a stable rim and then  went back to the medial compartment  A probe was placed in the joint the meniscus was stable  We then used a 20 drill bit to perform microfractures/subchondral drilling of the medial femoral condyle. We placed a shaver and the joint and confirmed bleeding bed from the microfracture drilling.  We irrigated the knee remove meniscal and cartilaginous fragments use cautery to control any synovial bleeding and then injected the knee with 60 mL of Marcaine with epinephrine  I closed the portals with 3-0 nylon suture  SURGEON:  Surgeon(s) and Role:    * Carole Civil, MD - Primary  PHYSICIAN ASSISTANT:   ASSISTANTS: none   ANESTHESIA:   general  EBL:  Total I/O In: 500 [I.V.:500] Out: 5 [Blood:5]  BLOOD ADMINISTERED:none  DRAINS: none   LOCAL MEDICATIONS USED:  MARCAINE     SPECIMEN:  No Specimen  DISPOSITION OF SPECIMEN:  N/A  COUNTS:  YES  TOURNIQUET:    DICTATION: .Dragon Dictation  PLAN OF CARE: Discharge to home after PACU  PATIENT DISPOSITION:  PACU - hemodynamically stable.   Delay start of Pharmacological VTE agent (>24hrs) due to surgical blood loss or risk of bleeding: not applicable

## 2016-04-14 NOTE — ED Provider Notes (Signed)
[facility] DEPT Provider Note   CSN: [id number] Arrival date & time: 04/14/16  2137     History   Chief Complaint Chief Complaint  Patient presents with  . Wound Check    HPI Rhonda Hickman is a 60 y.o. female.  Status post right knee arthroscopy, medial and lateral meniscectomy, repair of microfracture this morning by Dr. Aline Brochure. She now has bleeding from the surgical site. She has been bearing weight and walking today. Severity of symptoms is moderate      Past Medical History:  Diagnosis Date  . Anxiety   . Asthma   . Bipolar 1 disorder ([place])   . Bulging lumbar disc   . Chronic pain of left knee   . COPD (chronic obstructive pulmonary disease) ([place])   . Diabetes mellitus     Patient Active Problem List   Diagnosis Date Noted  . Derangement of posterior horn of medial meniscus of right knee   . Meniscus, lateral, derangement, right   . Arthritis of knee, right   . COPD (chronic obstructive pulmonary disease) ([place]) 03/09/2016  . DIABETES MELLITUS, TYPE II 02/16/2007  . ABUSE, COCAINE, UNSPECIFIED 02/16/2007  . ASTHMA, EXTRINSIC NOS 02/16/2007  . HOMELESSNESS, HX OF 02/16/2007    Past Surgical History:  Procedure Laterality Date  . ABDOMINAL HYSTERECTOMY    . CARPAL TUNNEL RELEASE Right     OB History    No data available       Home Medications    Prior to Admission medications   Medication Sig Start Date End Date Taking? Authorizing Provider  albuterol (PROVENTIL HFA;VENTOLIN HFA) 108 (90 BASE) MCG/ACT inhaler Inhale 2 puffs into the lungs every 6 (six) hours as needed for wheezing.    Historical Provider, MD  albuterol (PROVENTIL) (2.5 MG/3ML) 0.083% nebulizer solution Take 2.5 mg by nebulization every 6 (six) hours as needed for shortness of breath.     Historical Provider, MD  amLODipine (NORVASC) 5 MG tablet Take 5 mg by mouth daily.    Historical Provider, MD  amoxicillin (AMOXIL) 500 MG capsule Take 1 capsule (500 mg total) by mouth 3  (three) times daily. Patient not taking: Reported on 04/11/2016 12/24/15   Ashley Murrain, NP  diclofenac (VOLTAREN) 75 MG EC tablet Take 1 tablet (75 mg total) by mouth 2 (two) times daily with a meal. 04/03/15   Carole Civil, MD  diclofenac sodium (VOLTAREN) 1 % GEL Apply 2 g topically 4 (four) times daily. 02/29/16   Lily Kocher, PA-C  divalproex (DEPAKOTE ER) 500 MG 24 hr tablet Take 1,000-1,500 mg by mouth 2 (two) times daily. 2 tablets in the morning and 3 tablets at bedtime.    Historical Provider, MD  gabapentin (NEURONTIN) 300 MG capsule Take 600 mg by mouth 3 (three) times daily as needed (pain).     Historical Provider, MD  HYDROcodone-acetaminophen (NORCO/VICODIN) 5-325 MG tablet Take 1 tablet by mouth every 4 (four) hours as needed. Patient not taking: Reported on 04/11/2016 12/24/15   Ashley Murrain, NP  insulin detemir (LEVEMIR) 100 UNIT/ML injection Inject 10 Units into the skin at bedtime.    Historical Provider, MD  insulin NPH-regular (NOVOLIN 70/30) (70-30) 100 UNIT/ML injection Inject 50 Units into the skin 3 (three) times daily.     Historical Provider, MD  oxybutynin (DITROPAN) 5 MG tablet Take 5 mg by mouth 2 (two) times daily.    Historical Provider, MD  oxyCODONE-acetaminophen (PERCOCET) 10-325 MG tablet Take 1 tablet by mouth every  4 (four) hours as needed for pain. 04/14/16   Carole Civil, MD  promethazine (PHENERGAN) 12.5 MG suppository Place 1 suppository (12.5 mg total) rectally every 6 (six) hours as needed for nausea or vomiting. 04/14/16   Carole Civil, MD  risperiDONE (RISPERDAL) 0.25 MG tablet Take 0.25 mg by mouth at bedtime.    Historical Provider, MD  traZODone (DESYREL) 100 MG tablet Take 200 mg by mouth at bedtime as needed for sleep.     Historical Provider, MD    Family History No family history on file.  Social History Social History  Substance Use Topics  . Smoking status: Current Some Day Smoker    Packs/day: 0.50    Types: Cigarettes  .  Smokeless tobacco: Never Used  . Alcohol use No     Allergies   Aspirin and Ibuprofen   Review of Systems Review of Systems  All other systems reviewed and are negative.    Physical Exam Updated Vital Signs BP 98/68 (BP Location: Right Arm)   Pulse 85   Temp 98.1 F (36.7 C) (Oral)   Resp 18   Ht 6\' 2"  (1.88 m)   Wt 280 lb (127 kg)   SpO2 99%   BMI 35.95 kg/m   Physical Exam  Constitutional: She is oriented to person, place, and time. She appears well-developed and well-nourished.  HENT:  Head: Normocephalic and atraumatic.  Eyes: Conjunctivae are normal.  Neck: Neck supple.  Musculoskeletal:  Right knee: 2 puncture sites at superior medial and lateral aspect of knee well approximated with suture.  No active bleeding  Neurological: She is alert and oriented to person, place, and time.  Skin: Skin is warm and dry.  Psychiatric: She has a normal mood and affect. Her behavior is normal.  Nursing note and vitals reviewed.    ED Treatments / Results  Labs (all labs ordered are listed, but only abnormal results are displayed) Labs Reviewed - No data to display  EKG  EKG Interpretation None       Radiology No results found.  Procedures Procedures (including critical care time)  Medications Ordered in ED Medications - No data to display   Initial Impression / Assessment and Plan / ED Course  I have reviewed the triage vital signs and the nursing notes.  Pertinent labs & imaging results that were available during my care of the patient were reviewed by me and considered in my medical decision making (see chart for details).  Clinical Course    Patient presently has good hemostasis. We rewrapped the knee with a pressure dressing. Patient has been encouraged to elevate her knee and apply ice.  Final Clinical Impressions(s) / ED Diagnoses   Final diagnoses:  Acute pain of right knee    New Prescriptions Discharge Medication List as of 04/14/2016   9:56 PM       Nat Christen, MD 04/14/16 2217

## 2016-04-14 NOTE — Op Note (Signed)
04/14/2016  9:08 AM  PATIENT:  Rhonda Hickman  60 y.o. female  PRE-OPERATIVE DIAGNOSIS:  right lateral and medial meniscus tears  POST-OPERATIVE DIAGNOSIS:  right lateral and medial meniscus tears and arthritis  PROCEDURE:  Procedure(s) with comments: KNEE ARTHROSCOPY WITH MEDIAL AND LATERAL MENISECTOMY, MICROFRACTURE REPAIR (Right)  - needs crutch training  FINDINGS:  Medial compartment we saw grade 4 chondral lesion measuring 20 x 6 over the medial femoral condyle with a posterior horn medial meniscal tear  Notch normal anterior cruciate ligament PCL  Patellofemoral joint mild chondromalacia grade 1 medial and lateral facet  Lateral compartment grade 2 chondral lesion lateral tibial plateau with a posterior horn lateral meniscal tear  Details of procedure  Initial evaluation occurred in the preop area the patient was identified as Rhonda Hickman using appropriate identifying mechanisms. Chart review was completed surgical site confirmed site marked as right knee.  She was then taken to the operating room and given general anesthesia which was successful without complication.  In the supine position the right knee and leg from groin to ankle was prepped with ChloraPrep. This was followed by sterile draping technique.  Timeout was completed.  I made a lateral portal and placed a scope in the joint. I did a diagnostic arthroscopy completely visualizing the joint starting in the medial compartment coming across the notch into the lateral compartment both medial and lateral gutters as well as the patellofemoral joint and suprapatellar pouch.  I made a medial portal and did a medial meniscectomy using a biter shaver and ArthroCare wand.  I debrided the chondral lesion and measured to be 20 mm long by 6 mm wide.  I then resected the torn medial meniscus at the posterior horn and shave the free edge tears of the body and lateral meniscus anterior horn. I confirmed a stable rim and then  went back to the medial compartment  A probe was placed in the joint the meniscus was stable  We then used a 20 drill bit to perform microfractures/subchondral drilling of the medial femoral condyle. We placed a shaver and the joint and confirmed bleeding bed from the microfracture drilling.  We irrigated the knee remove meniscal and cartilaginous fragments use cautery to control any synovial bleeding and then injected the knee with 60 mL of Marcaine with epinephrine  I closed the portals with 3-0 nylon suture  SURGEON:  Surgeon(s) and Role:    * Carole Civil, MD - Primary  PHYSICIAN ASSISTANT:   ASSISTANTS: none   ANESTHESIA:   general  EBL:  Total I/O In: 500 [I.V.:500] Out: 5 [Blood:5]  BLOOD ADMINISTERED:none  DRAINS: none   LOCAL MEDICATIONS USED:  MARCAINE     SPECIMEN:  No Specimen  DISPOSITION OF SPECIMEN:  N/A  COUNTS:  YES  TOURNIQUET:    DICTATION: .Dragon Dictation  PLAN OF CARE: Discharge to home after PACU  PATIENT DISPOSITION:  PACU - hemodynamically stable.   Delay start of Pharmacological VTE agent (>24hrs) due to surgical blood loss or risk of bleeding: not applicable

## 2016-04-14 NOTE — Transfer of Care (Signed)
Immediate Anesthesia Transfer of Care Note  Patient: Rhonda Hickman  Procedure(s) Performed: Procedure(s) with comments: KNEE ARTHROSCOPY WITH MEDIAL AND LATERAL MENISECTOMY, MICROFRACTURE REPAIR (Right) - lateral menisectomy - needs crutch training  Patient Location: PACU  Anesthesia Type:General  Level of Consciousness: awake and patient cooperative  Airway & Oxygen Therapy: Patient Spontanous Breathing and Patient connected to face mask oxygen  Post-op Assessment: Report given to RN, Post -op Vital signs reviewed and stable and Patient moving all extremities  Post vital signs: Reviewed and stable  Last Vitals:  Vitals:   04/14/16 0800 04/14/16 0805  BP: 102/64 121/75  Pulse:    Resp: 17   Temp:      Last Pain:  Vitals:   04/14/16 0733  TempSrc: Oral  PainSc: 0-No pain         Complications: No apparent anesthesia complications

## 2016-04-14 NOTE — Anesthesia Procedure Notes (Signed)
Procedure Name: Intubation Date/Time: 04/14/2016 8:20 AM Performed by: Charmaine Downs Pre-anesthesia Checklist: Patient identified, Patient being monitored, Timeout performed, Emergency Drugs available and Suction available Patient Re-evaluated:Patient Re-evaluated prior to inductionOxygen Delivery Method: Circle System Utilized Preoxygenation: Pre-oxygenation with 100% oxygen Intubation Type: IV induction Ventilation: Mask ventilation without difficulty and Oral airway inserted - appropriate to patient size Laryngoscope Size: Mac and 3 Grade View: Grade II Tube type: Oral Tube size: 7.0 mm Number of attempts: 1 Airway Equipment and Method: stylet Placement Confirmation: ETT inserted through vocal cords under direct vision,  positive ETCO2 and breath sounds checked- equal and bilateral Secured at: 22 cm Tube secured with: Tape Dental Injury: Teeth and Oropharynx as per pre-operative assessment

## 2016-04-14 NOTE — Anesthesia Postprocedure Evaluation (Signed)
Anesthesia Post Note  Patient: Norma Zakarian  Procedure(s) Performed: Procedure(s) (LRB): KNEE ARTHROSCOPY WITH MEDIAL AND LATERAL MENISECTOMY, MICROFRACTURE REPAIR (Right)  Patient location during evaluation: PACU Anesthesia Type: General Level of consciousness: awake, oriented and patient cooperative Pain management: pain level controlled Vital Signs Assessment: post-procedure vital signs reviewed and stable Respiratory status: spontaneous breathing, nonlabored ventilation and respiratory function stable Cardiovascular status: blood pressure returned to baseline Postop Assessment: no signs of nausea or vomiting Anesthetic complications: no    Last Vitals:  Vitals:   04/14/16 0800 04/14/16 0805  BP: 102/64 121/75  Pulse:    Resp: 17   Temp:      Last Pain:  Vitals:   04/14/16 0733  TempSrc: Oral  PainSc: 0-No pain                 Emeri Estill J

## 2016-04-14 NOTE — Discharge Instructions (Signed)
Elevate leg. Ice pack. Follow-up with Dr. Aline Brochure

## 2016-04-14 NOTE — Interval H&P Note (Signed)
History and Physical Interval Note:  04/14/2016 8:04 AM BP 136/73   Pulse 66   Temp 98.1 F (36.7 C) (Oral)   Resp 13   Ht 5\' 11"  (1.803 m)   Wt 283 lb (128.4 kg)   SpO2 98%   BMI 39.47 kg/m   Surgical site clean  Rhonda Hickman  has presented today for surgery, with the diagnosis of right lateral and medial meniscus tears  The various methods of treatment have been discussed with the patient and family. After consideration of risks, benefits and other options for treatment, the patient has consented to  Procedure(s) with comments: KNEE ARTHROSCOPY WITH MEDIAL MENISECTOMY (Right) - lateral menisectomy - needs crutch training as a surgical intervention .  The patient's history has been reviewed, patient examined, no change in status, stable for surgery.  I have reviewed the patient's chart and labs.  Questions were answered to the patient's satisfaction.     Arther Abbott

## 2016-04-14 NOTE — Discharge Instructions (Signed)
Arthroscopy, With Meniscus Injury, Care After Refer to this sheet in the next few weeks. These instructions provide you with general information on caring for yourself after your procedure. Your health care provider may also give you specific instructions. Your treatment has been planned according to the current medical practices, but problems sometimes occur. Call your health care provider if you have any problems or questions after your procedure. WHAT TO EXPECT AFTER THE PROCEDURE After your procedure, it is typical to have the following:  Pain and swelling in your knee.  Constipation.  Difficulty walking. HOME CARE INSTRUCTIONS   Use crutches and do knee exercises as directed by your health care provider.  Apply ice to the injured area:  Put ice in a plastic bag.  Place a towel between your skin and the bag.  Leave the ice on for 15-20 minutes, 3-4 times a day while awake. Do this for the first 2 days.  Rest and raise (elevate) your knee.  Change bandages (dressings) as directed by your health care provider.  Keep the wound dry and clean. The wound may be washed gently with soap and water. Gently blot or dab the wound dry. It is okay to take showers 24-48 hours after surgery. Do not take baths, use swimming pools, or use hot tubs for 14 days, or as directed by your health care provider.  Only take over-the-counter or prescription medicines for pain, discomfort, or fever as directed by your health care provider.  Continue your normal diet as directed by your health care provider.  Do not lift anything more than 10 pounds or play contact sports for 3 weeks, or as directed by your health care provider.  If a brace was applied, use as directed by your health care provider.  Your health care provider will help with instructions for rehabilitation of your knee. SEEK MEDICAL CARE IF:   You have increased bleeding (more than a small spot) from the wound.  You have redness,  swelling, or increasing pain in the wound.  Yellowish-white fluid (pus) is coming from your wound. SEEK IMMEDIATE MEDICAL CARE IF:   You develop a rash.  You have a fever or persistent symptoms for more than 2-3 days.  You have difficulty breathing.  You have increasing pain with movement of the knee. MAKE SURE YOU:   Understand these instructions.  Will watch your condition.  Will get help right away if you are not doing well or get worse.   This information is not intended to replace advice given to you by your health care provider. Make sure you discuss any questions you have with your health care provider.   Document Released: 01/14/2005 Document Revised: 02/27/2013 Document Reviewed: 12/04/2012 Elsevier Interactive Patient Education [employer].

## 2016-04-18 ENCOUNTER — Ambulatory Visit (INDEPENDENT_AMBULATORY_CARE_PROVIDER_SITE_OTHER): Payer: Self-pay | Admitting: Orthopedic Surgery

## 2016-04-18 ENCOUNTER — Encounter: Payer: Self-pay | Admitting: Orthopedic Surgery

## 2016-04-18 VITALS — BP 110/69 | HR 88 | Wt 279.0 lb

## 2016-04-18 DIAGNOSIS — Z9889 Other specified postprocedural states: Secondary | ICD-10-CM

## 2016-04-18 MED ORDER — OXYCODONE-ACETAMINOPHEN 5-325 MG PO TABS: 1.0000 | ORAL_TABLET | ORAL | 0 refills | Status: DC | PRN

## 2016-04-18 NOTE — Patient Instructions (Signed)
PT AT [facility] THERAPY DEPT

## 2016-04-18 NOTE — Progress Notes (Signed)
PRE-OPERATIVE DIAGNOSIS:  right lateral and medial meniscus tears   POST-OPERATIVE DIAGNOSIS:  right lateral and medial meniscus tears and arthritis   PROCEDURE:  Procedure(s) with comments: KNEE ARTHROSCOPY WITH MEDIAL AND LATERAL MENISECTOMY, MICROFRACTURE REPAIR (Right)  - needs crutch training   FINDINGS:  Medial compartment we saw grade 4 chondral lesion measuring 20 x 6 over the medial femoral condyle with a posterior horn medial meniscal tear   Notch normal anterior cruciate ligament PCL   Patellofemoral joint mild chondromalacia grade 1 medial and lateral facet   Lateral compartment grade 2 chondral lesion lateral tibial plateau with a posterior horn lateral meniscal tear Patient ID: Rhonda Hickman, female   DOB: 08-13-1955, 60 y.o.   MRN: [id number]  Post op visit   Chief Complaint  Patient presents with  . Follow-up    POST OP 1, SARK, DOS 04/14/16    BP 110/69   Pulse 88   Wt 279 lb (126.6 kg)   BMI 35.82 kg/m   Mild effusion. Clean portals. We removed the sutures.  Knee flexion 85 knee extension 5  Calf supple nontender negative Homans sign  Start PT at [facility] outpatient  Refill PERCOCET 5 mg every 4 #42  Come back in 3 weeks

## 2016-04-19 ENCOUNTER — Encounter (HOSPITAL_COMMUNITY): Payer: Self-pay | Admitting: Orthopedic Surgery

## 2016-04-22 ENCOUNTER — Encounter (HOSPITAL_COMMUNITY): Payer: Self-pay | Admitting: Physical Therapy

## 2016-04-22 ENCOUNTER — Ambulatory Visit (HOSPITAL_COMMUNITY): Payer: No Typology Code available for payment source | Attending: Orthopedic Surgery | Admitting: Physical Therapy

## 2016-04-22 DIAGNOSIS — M25561 Pain in right knee: Secondary | ICD-10-CM | POA: Insufficient documentation

## 2016-04-22 DIAGNOSIS — M25661 Stiffness of right knee, not elsewhere classified: Secondary | ICD-10-CM | POA: Insufficient documentation

## 2016-04-22 DIAGNOSIS — R2681 Unsteadiness on feet: Secondary | ICD-10-CM | POA: Insufficient documentation

## 2016-04-22 DIAGNOSIS — R2689 Other abnormalities of gait and mobility: Secondary | ICD-10-CM | POA: Insufficient documentation

## 2016-04-22 NOTE — Therapy (Signed)
[facility] [address] [place], [place], [address] Phone: [phone]   Fax:  [phone]  Physical Therapy Evaluation  Patient Details  Name: Rhonda Hickman MRN: [id number] Date of Birth: [birth year] Referring Provider: Arther Abbott, MD  Encounter Date: 04/22/2016      PT End of Session - 04/22/16 1622    Visit Number 1   Number of Visits 17   Date for PT Re-Evaluation 05/20/16   Authorization Time Period 04/22/16 to 06/17/16   PT Start Time 1540   PT Stop Time [id number]   PT Time Calculation (min) 41 min   Activity Tolerance Patient tolerated treatment well;No increased pain   Behavior During Therapy WFL for tasks assessed/performed      Past Medical History:  Diagnosis Date  . Anxiety   . Asthma   . Bipolar 1 disorder ([place])   . Bulging lumbar disc   . Chronic pain of left knee   . COPD (chronic obstructive pulmonary disease) ([place])   . Diabetes mellitus     Past Surgical History:  Procedure Laterality Date  . ABDOMINAL HYSTERECTOMY    . CARPAL TUNNEL RELEASE Right   . KNEE ARTHROSCOPY WITH MEDIAL MENISECTOMY Right 04/14/2016   Procedure: KNEE ARTHROSCOPY WITH MEDIAL AND LATERAL MENISECTOMY, MICROFRACTURE REPAIR;  Surgeon: Carole Civil, MD;  Location: [facility];  Service: Orthopedics;  Laterality: Right;  lateral menisectomy - needs crutch training    There were no vitals filed for this visit.       Subjective Assessment - 04/22/16 1544    Subjective Pt reports she had surgery last Thursday 04/14/16 after a long time of her knee hurting and buckling on her. Things are better now after the surgery. She has pain but takes her pain medication to help with this.    Pertinent History asthma, anxiety, COPD, DM, bipolar disorder    Limitations Walking   How long can you sit comfortably? unlimited    How long can you stand comfortably? 15 minutes    How long can you walk comfortably? with walker, unlimited; without walker  only able to walk for several minutes    Patient Stated Goals walking better    Currently in Pain? Yes   Pain Score 7    Pain Location Knee   Pain Orientation Right   Pain Descriptors / Indicators Burning;Aching   Pain Type Surgical pain   Pain Radiating Towards none    Pain Onset In the past 7 days   Pain Frequency Constant   Aggravating Factors  walking, standing, letting her leg hang   Pain Relieving Factors elevating, ice             [facility] PT Assessment - 04/22/16 0001      Assessment   Medical Diagnosis s/p Rt knee scope    Referring Provider Arther Abbott, MD   Onset Date/Surgical Date 04/14/16   Next MD Visit 05/09/16   Prior Therapy none      Precautions   Precautions None     Restrictions   Weight Bearing Restrictions No     Balance Screen   Has the patient fallen in the past 6 months Yes   How many times? 1  walking without AD, fell against wall and slid to floor      [place] residence   Additional Comments 2 STE     Prior Function   Level of [place] sit  around the house, watch TV, cleaning the house      Cognition   Overall Cognitive Status Within Functional Limits for tasks assessed     Observation/Other Assessments   Observations sitting with RLE elevated; portal sites intact and healing well; no redness/warmth noted    Focus on Therapeutic Outcomes (FOTO)  79% limited      Sensation   Light Touch Appears Intact     ROM / Strength   AROM / PROM / Strength AROM;Strength     AROM   AROM Assessment Site Knee   Right/Left Knee Right;Left   Right Knee Extension 5  lacking    Right Knee Flexion 100     Strength   Strength Assessment Site Hip;Knee;Ankle   Right/Left Hip Right;Left   Right Hip Flexion 4+/5   Right Hip Extension 4+/5   Right Hip ABduction 4+/5   Left Hip Flexion 5/5   Left Hip Extension 4+/5   Left Hip ABduction 4+/5   Right/Left Knee Right;Left   Right  Knee Flexion 4/5   Right Knee Extension 4/5   Left Knee Flexion 5/5   Left Knee Extension 5/5   Right/Left Ankle Right;Left   Right Ankle Dorsiflexion 5/5   Left Ankle Dorsiflexion 5/5     Palpation   Palpation comment TTP along portal sites      Transfers   Five time sit to stand comments  42.5 sec without UE, weight shift Lt      Ambulation/Gait   Ambulation/Gait Yes   Ambulation/Gait Assistance 6: Modified independent (Device/Increase time)   Ambulation Distance (Feet) 100 Feet   Assistive device None   Gait Pattern Decreased arm swing - left;Decreased arm swing - right;Decreased step length - left;Decreased step length - right;Decreased stance time - right;Decreased stride length;Decreased dorsiflexion - right;Decreased weight shift to right;Trunk flexed   Gait Comments trialed ambulation with SPC. Pt with increased verbal cues to improve sequencing, noting decreased heel strike, Lt step length, antalgic pattern     Standardized Balance Assessment   Standardized Balance Assessment Timed Up and Go Test     Timed Up and Go Test   TUG Comments 14.5 sec, no AD                           PT Education - 04/22/16 1623    Education provided Yes   Education Details eval findings/POC; importance of using RW until gait pattern improves to prevent risk of forming bad habbit; sit to stand with proper technique to reduce stress on LLE; HEP   Person(s) Educated Patient   Methods Explanation;Demonstration;Verbal cues;Handout   Comprehension Verbalized understanding;Returned demonstration;Need further instruction          PT Short Term Goals - 04/22/16 1629      PT SHORT TERM GOAL #1   Title Pt will demo consistency and independence with her HEP to improve knee ROM and strength    Time 2   Period Weeks   Status New     PT SHORT TERM GOAL #2   Title Pt will demo improved technique with sit to stand, evident by equal weight shift, without cues from therapist x10  reps.    Time 2   Period Weeks   Status New     PT SHORT TERM GOAL #3   Title Pt will demo improved gait mechanics evident by symmetrical step length and heel toe sequencing x158ft without cues from therapist, using LRAD  Time 4   Period Weeks   Status New           PT Long Term Goals - 04/22/16 1632      PT LONG TERM GOAL #1   Title Pt will demo improved BLE strength to 5/5 MMT to improve her safety with functional tasks.   Time 8   Period Weeks   Status New     PT LONG TERM GOAL #2   Title Pt will perform 5x sit to stand in less than 11 sec, without UE and without weight shift, to indicate improved functional strength.    Time 8   Period Weeks   Status New     PT LONG TERM GOAL #3   Title Pt will perform TUG in less than 13 sec with LRAD to indicate a decreased risk of falling in the community.    Time 8   Period Weeks   Status New     PT LONG TERM GOAL #4   Title Pt will demo improved Rt knee ROM from 0-120 degrees to improve her funcitonal performance during ambulation and sit to stand.    Time 8   Period Weeks   Status New     PT LONG TERM GOAL #5   Title Pt will report no more than 3/10 pain with daily activity to improve her comfort and tolerance with ADLs.    Time 8   Period Weeks   Status New               Plan - 04/22/16 1627    Clinical Impression Statement Pt is a 60yo F referred to OPPT s/p R knee scope and medial/lateral meniscectomy on 04/14/16. She presents with post-surgical pain and swelling as well as antalgic gait and altered mechanics with sit to stand. She demonstrates minor limitations in BLE strength, largely pain limited and limitations in Rt knee ROM. She arrived without an AD and I reviewed the importance of using AD to prevent poor mechanics, also worked on Personnel officer with [facility] with pt requiring verbal cues for improved technique. At this time, I recommended pt continue with RW until we can further address this in the clinic.  HEP was initiated with pt verbalizing understanding. She would benefit from skilled PT to address her limitations listed above and improve her independence with daily activity.    Rehab Potential Good   PT Frequency 2x / week   PT Duration 8 weeks   PT Treatment/Interventions ADLs/Self Care Home Management;Cryotherapy;Aquatic Therapy;Functional mobility training;Therapeutic activities;Stair training;Gait training;Balance training;DME Instruction;Neuromuscular re-education;Patient/family education;Manual techniques;Scar mobilization;Passive range of motion   PT Next Visit Plan gait training with RW, review sit to stand technique, therex to address knee ext/flex ROM   PT Home Exercise Plan sit to stand x10 reps with focus on equal weight shift, knee ext stretch x30 min, scar desensitization 5-10 min per day, walking with RW daily    Recommended Other Services none    Consulted and Agree with Plan of Care Patient      Patient will benefit from skilled therapeutic intervention in order to improve the following deficits and impairments:  Abnormal gait, Decreased activity tolerance, Decreased balance, Decreased knowledge of use of DME, Decreased safety awareness, Decreased strength, Impaired flexibility, Postural dysfunction, Pain, Improper body mechanics, Increased edema, Decreased scar mobility, Decreased range of motion, Decreased endurance, Decreased mobility, Difficulty walking, Hypomobility, Increased muscle spasms  Visit Diagnosis: Acute pain of right knee  Stiffness of right knee, not elsewhere  classified  Other abnormalities of gait and mobility  Unsteadiness on feet      G-Codes - 05/20/16 1646    Functional Assessment Tool Used FOTO: 67% limited    Functional Limitation Mobility: Walking and moving around   Mobility: Walking and Moving Around Current Status [phone]) At least 60 percent but less than 80 percent impaired, limited or restricted   Mobility: Walking and Moving Around Goal  Status [phone]) At least 20 percent but less than 40 percent impaired, limited or restricted       Problem List Patient Active Problem List   Diagnosis Date Noted  . Derangement of posterior horn of medial meniscus of right knee   . Meniscus, lateral, derangement, right   . Arthritis of knee, right   . COPD (chronic obstructive pulmonary disease) ([place]) 03/09/2016  . DIABETES MELLITUS, TYPE II 02/16/2007  . ABUSE, COCAINE, UNSPECIFIED 02/16/2007  . ASTHMA, EXTRINSIC NOS 02/16/2007  . HOMELESSNESS, HX OF 02/16/2007    4:46 PM,2016-05-20 Elly Modena PT, DPT [name] [address] [place], [place], [address] Phone: [phone]   Fax:  [phone]  Name: Rhonda Hickman MRN: [id number] Date of Birth: 04/10/1956

## 2016-04-27 ENCOUNTER — Telehealth (HOSPITAL_COMMUNITY): Payer: Self-pay | Admitting: Physical Therapy

## 2016-04-27 ENCOUNTER — Ambulatory Visit (HOSPITAL_COMMUNITY): Payer: No Typology Code available for payment source | Admitting: Physical Therapy

## 2016-04-27 NOTE — Telephone Encounter (Signed)
Patient did not come to this appointment; called and spoke to patient, who states she called and cancelled appointment. Per rehab supervisor, this session will be counted as a cancellation.   Deniece Ree PT, DPT [phone]

## 2016-04-28 ENCOUNTER — Ambulatory Visit (HOSPITAL_COMMUNITY): Payer: No Typology Code available for payment source | Admitting: Physical Therapy

## 2016-04-28 DIAGNOSIS — M25561 Pain in right knee: Secondary | ICD-10-CM

## 2016-04-28 DIAGNOSIS — R2681 Unsteadiness on feet: Secondary | ICD-10-CM

## 2016-04-28 DIAGNOSIS — M25661 Stiffness of right knee, not elsewhere classified: Secondary | ICD-10-CM

## 2016-04-28 DIAGNOSIS — R2689 Other abnormalities of gait and mobility: Secondary | ICD-10-CM

## 2016-04-28 NOTE — Therapy (Signed)
[facility] [place], [place], [address] Phone: [phone]   Fax:  [phone]  Physical Therapy Treatment  Patient Details  Name: Rhonda Hickman MRN: [id number] Date of Birth: 09-17-1955 Referring Provider: Arther Abbott, MD  Encounter Date: 04/28/2016      PT End of Session - 04/28/16 1341    Visit Number 2   Number of Visits 17   Date for PT Re-Evaluation 05/20/16   Authorization Time Period 04/22/16 to 06/17/16   PT Start Time 1303   PT Stop Time 1341   PT Time Calculation (min) 38 min   Activity Tolerance Patient tolerated treatment well;Patient limited by pain   Behavior During Therapy [facility] for tasks assessed/performed      Past Medical History:  Diagnosis Date  . Anxiety   . Asthma   . Bipolar 1 disorder ([place])   . Bulging lumbar disc   . Chronic pain of left knee   . COPD (chronic obstructive pulmonary disease) ([place])   . Diabetes mellitus     Past Surgical History:  Procedure Laterality Date  . ABDOMINAL HYSTERECTOMY    . CARPAL TUNNEL RELEASE Right   . KNEE ARTHROSCOPY WITH MEDIAL MENISECTOMY Right 04/14/2016   Procedure: KNEE ARTHROSCOPY WITH MEDIAL AND LATERAL MENISECTOMY, MICROFRACTURE REPAIR;  Surgeon: Carole Civil, MD;  Location: [facility];  Service: Orthopedics;  Laterality: Right;  lateral menisectomy - needs crutch training    There were no vitals filed for this visit.      Subjective Assessment - 04/28/16 1307    Subjective Patient arrives today stating that she would like to transfer to [facility] clinic (the front desk is working on this); she is still confused about last session even though repeatedly being educated that clinic is counting this as a cancellation rather than a no-show   Pertinent History asthma, anxiety, COPD, DM, bipolar disorder    Patient Stated Goals walking better    Currently in Pain? Yes   Pain Score 8    Pain Location Knee   Pain Orientation Right   Pain  Descriptors / Indicators Burning;Nagging   Pain Type Surgical pain   Pain Radiating Towards into tibia    Pain Onset 1 to 4 weeks ago   Pain Frequency Constant   Aggravating Factors  everything    Pain Relieving Factors pain pills    Effect of Pain on Daily Activities severe effect on regular activities                          [facility] Adult PT Treatment/Exercise - 04/28/16 0001      Ambulation/Gait   Gait Comments adjusted walker, general education about proper/safe use of walker throughout session      Knee/Hip Exercises: Standing   Other Standing Knee Exercises lateral weight shifts inside rolling walker 1x20     Knee/Hip Exercises: Seated   Long Arc Quad Right;1 set;10 reps   Hamstring Curl Both;1 set;10 reps     Knee/Hip Exercises: Supine   Quad Sets Right;1 set;15 reps   Quad Sets Limitations 3 second holds     Manual Therapy   Manual Therapy Edema management   Manual therapy comments performed separately from all otehr skilled services    Edema Management retrograde massage with R LE elevated      Supine hamstring curls 1x10           PT Education - 04/28/16 1340  Education provided Yes   Education Details reviewed initial eval/goals; importance of targeted motion in managing knee pain/weakness    Person(s) Educated Patient   Methods Explanation;Handout   Comprehension Verbalized understanding          PT Short Term Goals - 04/22/16 1629      PT SHORT TERM GOAL #1   Title Pt will demo consistency and independence with her HEP to improve knee ROM and strength    Time 2   Period Weeks   Status New     PT SHORT TERM GOAL #2   Title Pt will demo improved technique with sit to stand, evident by equal weight shift, without cues from therapist x10 reps.    Time 2   Period Weeks   Status New     PT SHORT TERM GOAL #3   Title Pt will demo improved gait mechanics evident by symmetrical step length and heel toe sequencing x168ft without  cues from therapist, using LRAD    Time 4   Period Weeks   Status New           PT Long Term Goals - 04/22/16 1632      PT LONG TERM GOAL #1   Title Pt will demo improved BLE strength to 5/5 MMT to improve her safety with functional tasks.   Time 8   Period Weeks   Status New     PT LONG TERM GOAL #2   Title Pt will perform 5x sit to stand in less than 11 sec, without UE and without weight shift, to indicate improved functional strength.    Time 8   Period Weeks   Status New     PT LONG TERM GOAL #3   Title Pt will perform TUG in less than 13 sec with LRAD to indicate a decreased risk of falling in the community.    Time 8   Period Weeks   Status New     PT LONG TERM GOAL #4   Title Pt will demo improved Rt knee ROM from 0-120 degrees to improve her funcitonal performance during ambulation and sit to stand.    Time 8   Period Weeks   Status New     PT LONG TERM GOAL #5   Title Pt will report no more than 3/10 pain with daily activity to improve her comfort and tolerance with ADLs.    Time 8   Period Weeks   Status New               Plan - 04/28/16 1342    Clinical Impression Statement Patient arrives today with walker adjusted to inappropriate height, reporting severe pain in her R knee and worried about fluid in her knee; performed functional stretches followed immediately by retrograde massage for edema/pain control. Then performed gentle appropriate therapeutic exercise with close attention to patient form and tolerance today. Patient educated on importance of appropriate height of assistive device, walker adjusted accordingly by PT. Reviewed initial eval and goals after completing quick disclosure. Patient extremely limited by pain throughout today's session.    Rehab Potential Good   PT Frequency 2x / week   PT Duration 8 weeks   PT Next Visit Plan gait training with RW, review sit to stand technique, therex to address knee ext/flex ROM, edema/pain control  and safety training    PT Home Exercise Plan sit to stand x10 reps with focus on equal weight shift, knee ext stretch x30 min, scar  desensitization 5-10 min per day, walking with RW daily    Recommended Other Services possible transfer to [facility] clinic based on location    Consulted and Agree with Plan of Care Patient      Patient will benefit from skilled therapeutic intervention in order to improve the following deficits and impairments:  Abnormal gait, Decreased activity tolerance, Decreased balance, Decreased knowledge of use of DME, Decreased safety awareness, Decreased strength, Impaired flexibility, Postural dysfunction, Pain, Improper body mechanics, Increased edema, Decreased scar mobility, Decreased range of motion, Decreased endurance, Decreased mobility, Difficulty walking, Hypomobility, Increased muscle spasms  Visit Diagnosis: Acute pain of right knee  Stiffness of right knee, not elsewhere classified  Other abnormalities of gait and mobility  Unsteadiness on feet     Problem List Patient Active Problem List   Diagnosis Date Noted  . Derangement of posterior horn of medial meniscus of right knee   . Meniscus, lateral, derangement, right   . Arthritis of knee, right   . COPD (chronic obstructive pulmonary disease) ([place]) 03/09/2016  . DIABETES MELLITUS, TYPE II 02/16/2007  . ABUSE, COCAINE, UNSPECIFIED 02/16/2007  . ASTHMA, EXTRINSIC NOS 02/16/2007  . HOMELESSNESS, HX OF 02/16/2007    Deniece Ree PT, DPT [phone]  [facility] [address] [place], [place], [address] Phone: [phone]   Fax:  [phone]  Name: Rhonda Hickman MRN: [id number] Date of Birth: [birth year]

## 2016-05-03 ENCOUNTER — Ambulatory Visit (HOSPITAL_COMMUNITY): Payer: No Typology Code available for payment source

## 2016-05-04 ENCOUNTER — Ambulatory Visit: Payer: No Typology Code available for payment source | Attending: Orthopedic Surgery | Admitting: Physical Therapy

## 2016-05-04 DIAGNOSIS — M25561 Pain in right knee: Secondary | ICD-10-CM | POA: Insufficient documentation

## 2016-05-04 DIAGNOSIS — R2689 Other abnormalities of gait and mobility: Secondary | ICD-10-CM | POA: Insufficient documentation

## 2016-05-04 DIAGNOSIS — M25661 Stiffness of right knee, not elsewhere classified: Secondary | ICD-10-CM | POA: Insufficient documentation

## 2016-05-04 DIAGNOSIS — R2681 Unsteadiness on feet: Secondary | ICD-10-CM | POA: Insufficient documentation

## 2016-05-04 NOTE — Therapy (Signed)
[facility] [place], [place], [address] Phone: [phone]   Fax:  [phone]  Physical Therapy Treatment  Patient Details  Name: Rhonda Hickman MRN: [id number] Date of Birth: Oct 23, 1955 Referring Provider: Arther Abbott, MD  Encounter Date: 05/04/2016      PT End of Session - 05/04/16 1308    Visit Number 3   Number of Visits 17   Date for PT Re-Evaluation 05/20/16   PT Start Time 0100   PT Stop Time 0146   PT Time Calculation (min) 46 min   Activity Tolerance Patient limited by pain   Behavior During Therapy Restless      Past Medical History:  Diagnosis Date  . Anxiety   . Asthma   . Bipolar 1 disorder ([place])   . Bulging lumbar disc   . Chronic pain of left knee   . COPD (chronic obstructive pulmonary disease) ([place])   . Diabetes mellitus     Past Surgical History:  Procedure Laterality Date  . ABDOMINAL HYSTERECTOMY    . CARPAL TUNNEL RELEASE Right   . KNEE ARTHROSCOPY WITH MEDIAL MENISECTOMY Right 04/14/2016   Procedure: KNEE ARTHROSCOPY WITH MEDIAL AND LATERAL MENISECTOMY, MICROFRACTURE REPAIR;  Surgeon: Carole Civil, MD;  Location: [facility];  Service: Orthopedics;  Laterality: Right;  lateral menisectomy - needs crutch training    There were no vitals filed for this visit.      Subjective Assessment - 05/04/16 1309    Subjective My knee pain is a 7/10 today.   Pertinent History asthma, anxiety, COPD, DM, bipolar disorder    Limitations Walking   How long can you sit comfortably? unlimited    How long can you stand comfortably? 15 minutes    How long can you walk comfortably? with walker, unlimited; without walker only able to walk for several minutes    Patient Stated Goals walking better    Pain Score 7    Pain Location Knee   Pain Orientation Right   Pain Descriptors / Indicators Burning;Nagging   Pain Type Surgical pain   Pain Onset 1 to 4 weeks ago                          [facility] Adult PT Treatment/Exercise - 05/04/16 0001      Exercises   Exercises Knee/Hip     Knee/Hip Exercises: Aerobic   Nustep Level 1 x 10 minutes.     Modalities   Modalities Passenger transport manager Location RT Knee.   Electrical Stimulation Action IFCat 1-10 Hz at 100% scan x 15 minutes.   Electrical Stimulation Goals Edema;Pain     Vasopneumatic   Number Minutes Vasopneumatic  15 minutes   Vasopnuematic Location  --  RT knee.   Vasopneumatic Pressure Medium     Manual Therapy   Manual Therapy Passive ROM   Manual therapy comments Gentle PROM x 5 minutes in supine into right knee flexion and extension.  Patient tolerates only gentle ROM as she is still in a lot of pain.                  PT Short Term Goals - 04/22/16 1629      PT SHORT TERM GOAL #1   Title Pt will demo consistency and independence with her HEP to improve knee ROM and strength    Time 2   Period Weeks  Status New     PT SHORT TERM GOAL #2   Title Pt will demo improved technique with sit to stand, evident by equal weight shift, without cues from therapist x10 reps.    Time 2   Period Weeks   Status New     PT SHORT TERM GOAL #3   Title Pt will demo improved gait mechanics evident by symmetrical step length and heel toe sequencing x166ft without cues from therapist, using LRAD    Time 4   Period Weeks   Status New           PT Long Term Goals - 04/22/16 1632      PT LONG TERM GOAL #1   Title Pt will demo improved BLE strength to 5/5 MMT to improve her safety with functional tasks.   Time 8   Period Weeks   Status New     PT LONG TERM GOAL #2   Title Pt will perform 5x sit to stand in less than 11 sec, without UE and without weight shift, to indicate improved functional strength.    Time 8   Period Weeks   Status New     PT LONG TERM GOAL #3   Title Pt will perform TUG  in less than 13 sec with LRAD to indicate a decreased risk of falling in the community.    Time 8   Period Weeks   Status New     PT LONG TERM GOAL #4   Title Pt will demo improved Rt knee ROM from 0-120 degrees to improve her funcitonal performance during ambulation and sit to stand.    Time 8   Period Weeks   Status New     PT LONG TERM GOAL #5   Title Pt will report no more than 3/10 pain with daily activity to improve her comfort and tolerance with ADLs.    Time 8   Period Weeks   Status New               Plan - 05/04/16 1333    PT Treatment/Interventions Vasopneumatic Device;Electrical Stimulation      Patient will benefit from skilled therapeutic intervention in order to improve the following deficits and impairments:  Abnormal gait, Decreased activity tolerance, Decreased balance, Decreased knowledge of use of DME, Decreased safety awareness, Decreased strength, Impaired flexibility, Postural dysfunction, Pain, Improper body mechanics, Increased edema, Decreased scar mobility, Decreased range of motion, Decreased endurance, Decreased mobility, Difficulty walking, Hypomobility, Increased muscle spasms  Visit Diagnosis: Acute pain of right knee  Stiffness of right knee, not elsewhere classified  Other abnormalities of gait and mobility  Unsteadiness on feet     Problem List Patient Active Problem List   Diagnosis Date Noted  . Derangement of posterior horn of medial meniscus of right knee   . Meniscus, lateral, derangement, right   . Arthritis of knee, right   . COPD (chronic obstructive pulmonary disease) ([place]) 03/09/2016  . DIABETES MELLITUS, TYPE II 02/16/2007  . ABUSE, COCAINE, UNSPECIFIED 02/16/2007  . ASTHMA, EXTRINSIC NOS 02/16/2007  . HOMELESSNESS, HX OF 02/16/2007    [place], [place] MPT 05/04/2016, 1:47 PM  [facility] [address] [place], [place], [address] Phone: [phone]   Fax:   [phone]  Name: Rhonda Hickman MRN: [id number] Date of Birth: [birth year]

## 2016-05-05 ENCOUNTER — Encounter: Payer: Self-pay | Admitting: Physical Therapy

## 2016-05-05 ENCOUNTER — Ambulatory Visit: Payer: No Typology Code available for payment source | Admitting: Physical Therapy

## 2016-05-05 DIAGNOSIS — M25561 Pain in right knee: Secondary | ICD-10-CM

## 2016-05-05 DIAGNOSIS — R2689 Other abnormalities of gait and mobility: Secondary | ICD-10-CM

## 2016-05-05 DIAGNOSIS — R2681 Unsteadiness on feet: Secondary | ICD-10-CM

## 2016-05-05 DIAGNOSIS — M25661 Stiffness of right knee, not elsewhere classified: Secondary | ICD-10-CM

## 2016-05-05 NOTE — Therapy (Signed)
[facility] [place], [place], [address] Phone: [phone]   Fax:  [phone]  Physical Therapy Treatment  Patient Details  Name: Sherray Swickard MRN: [id number] Date of Birth: [birth year] Referring Provider: Arther Abbott, MD  Encounter Date: 05/05/2016      PT End of Session - 05/05/16 0939    Visit Number 4   Number of Visits 17   Date for PT Re-Evaluation 05/20/16   Authorization Time Period 04/22/16 to 06/17/16   PT Start Time 0947   PT Stop Time 1027   PT Time Calculation (min) 40 min   Activity Tolerance Patient limited by pain   Behavior During Therapy Restless      Past Medical History:  Diagnosis Date  . Anxiety   . Asthma   . Bipolar 1 disorder ([place])   . Bulging lumbar disc   . Chronic pain of left knee   . COPD (chronic obstructive pulmonary disease) ([place])   . Diabetes mellitus     Past Surgical History:  Procedure Laterality Date  . ABDOMINAL HYSTERECTOMY    . CARPAL TUNNEL RELEASE Right   . KNEE ARTHROSCOPY WITH MEDIAL MENISECTOMY Right 04/14/2016   Procedure: KNEE ARTHROSCOPY WITH MEDIAL AND LATERAL MENISECTOMY, MICROFRACTURE REPAIR;  Surgeon: Carole Civil, MD;  Location: [facility];  Service: Orthopedics;  Laterality: Right;  lateral menisectomy - needs crutch training    There were no vitals filed for this visit.      Subjective Assessment - 05/05/16 0939    Subjective Reports that her knee is still hurting. Reports that she has tried using her stationary bike at home but it hurts her back.   Pertinent History asthma, anxiety, COPD, DM, bipolar disorder    Limitations Walking   How long can you sit comfortably? unlimited    How long can you stand comfortably? 15 minutes    How long can you walk comfortably? with walker, unlimited; without walker only able to walk for several minutes    Patient Stated Goals walking better    Currently in Pain? Yes   Pain Score 8    Pain Location  Knee   Pain Orientation Right   Pain Descriptors / Indicators Throbbing;Burning;Tightness   Pain Type Surgical pain   Pain Radiating Towards into tibia   Pain Onset 1 to 4 weeks ago            [facility] PT Assessment - 05/05/16 0001      Assessment   Medical Diagnosis s/p Rt knee scope    Onset Date/Surgical Date 04/14/16   Next MD Visit 05/06/2016   Prior Therapy none      Precautions   Precautions None     Restrictions   Weight Bearing Restrictions No     Observation/Other Assessments-Edema    Edema Circumferential     Circumferential Edema   Circumferential - Right 48.1 cm   Circumferential - Left  47.8 cm                     [facility] Adult PT Treatment/Exercise - 05/05/16 0001      Knee/Hip Exercises: Aerobic   Nustep L2, seat 15, x10 min     Knee/Hip Exercises: Supine   Short Arc Quad Sets AROM;Right;2 sets;10 reps   Short Arc Quad Sets Limitations Patient reported increased R knee pain and requested to short sit on plinth     Modalities   Modalities IT consultant  Stimulation   Electrical Stimulation Location R knee   Electrical Stimulation Action IFC   Electrical Stimulation Parameters 1-10 hz x15 min   Electrical Stimulation Goals Edema;Pain     Vasopneumatic   Number Minutes Vasopneumatic  15 minutes   Vasopnuematic Location  Knee   Vasopneumatic Pressure Medium   Vasopneumatic Temperature  47                  PT Short Term Goals - 04/22/16 1629      PT SHORT TERM GOAL #1   Title Pt will demo consistency and independence with her HEP to improve knee ROM and strength    Time 2   Period Weeks   Status New     PT SHORT TERM GOAL #2   Title Pt will demo improved technique with sit to stand, evident by equal weight shift, without cues from therapist x10 reps.    Time 2   Period Weeks   Status New     PT SHORT TERM GOAL #3   Title Pt will demo improved gait mechanics evident by  symmetrical step length and heel toe sequencing x173ft without cues from therapist, using LRAD    Time 4   Period Weeks   Status New           PT Long Term Goals - 04/22/16 1632      PT LONG TERM GOAL #1   Title Pt will demo improved BLE strength to 5/5 MMT to improve her safety with functional tasks.   Time 8   Period Weeks   Status New     PT LONG TERM GOAL #2   Title Pt will perform 5x sit to stand in less than 11 sec, without UE and without weight shift, to indicate improved functional strength.    Time 8   Period Weeks   Status New     PT LONG TERM GOAL #3   Title Pt will perform TUG in less than 13 sec with LRAD to indicate a decreased risk of falling in the community.    Time 8   Period Weeks   Status New     PT LONG TERM GOAL #4   Title Pt will demo improved Rt knee ROM from 0-120 degrees to improve her funcitonal performance during ambulation and sit to stand.    Time 8   Period Weeks   Status New     PT LONG TERM GOAL #5   Title Pt will report no more than 3/10 pain with daily activity to improve her comfort and tolerance with ADLs.    Time 8   Period Weeks   Status New               Plan - 05/05/16 [id number]    Clinical Impression Statement Patient arrived today with increased R knee pain at this time. Patient continues to use FWW at this time for ambulation that is completed slowly secondary to pain. AROM R knee SAQ was completed but by the end of the exercise the patient was requesting to sit up and with increased pain. Only 0.3 cm difference with circumferential edema with R > L.  Patient reported increased difficulty with finding comfortable position while on modalities to help control increased pain. Patient continued to have increased pain even after the removal of the modalities.   Rehab Potential Good   PT Frequency 2x / week   PT Duration 8 weeks   PT Treatment/Interventions Vasopneumatic Device;Dealer  Stimulation   PT Next Visit Plan  Continue with menisectomy protocol per PT POC as symptoms dictate secondary to increased pain.   PT Home Exercise Plan sit to stand x10 reps with focus on equal weight shift, knee ext stretch x30 min, scar desensitization 5-10 min per day, walking with RW daily    Consulted and Agree with Plan of Care Patient      Patient will benefit from skilled therapeutic intervention in order to improve the following deficits and impairments:  Abnormal gait, Decreased activity tolerance, Decreased balance, Decreased knowledge of use of DME, Decreased safety awareness, Decreased strength, Impaired flexibility, Postural dysfunction, Pain, Improper body mechanics, Increased edema, Decreased scar mobility, Decreased range of motion, Decreased endurance, Decreased mobility, Difficulty walking, Hypomobility, Increased muscle spasms  Visit Diagnosis: Acute pain of right knee  Stiffness of right knee, not elsewhere classified  Other abnormalities of gait and mobility  Unsteadiness on feet     Problem List Patient Active Problem List   Diagnosis Date Noted  . Derangement of posterior horn of medial meniscus of right knee   . Meniscus, lateral, derangement, right   . Arthritis of knee, right   . COPD (chronic obstructive pulmonary disease) ([place]) 03/09/2016  . DIABETES MELLITUS, TYPE II 02/16/2007  . ABUSE, COCAINE, UNSPECIFIED 02/16/2007  . ASTHMA, EXTRINSIC NOS 02/16/2007  . HOMELESSNESS, HX OF 02/16/2007    Ahmed Prima, PTA 05/05/16 1:44 PM  Mali Applegate MPT [facility] [address] [place], [place], [address] Phone: [phone]   Fax:  [phone]  Name: Benita Walters MRN: [id number] Date of Birth: August 03, 1955

## 2016-05-06 ENCOUNTER — Encounter: Payer: Self-pay | Admitting: Orthopedic Surgery

## 2016-05-06 ENCOUNTER — Ambulatory Visit (INDEPENDENT_AMBULATORY_CARE_PROVIDER_SITE_OTHER): Payer: No Typology Code available for payment source | Admitting: Orthopedic Surgery

## 2016-05-06 ENCOUNTER — Encounter (HOSPITAL_COMMUNITY): Payer: No Typology Code available for payment source

## 2016-05-06 DIAGNOSIS — Z9889 Other specified postprocedural states: Secondary | ICD-10-CM

## 2016-05-06 DIAGNOSIS — Z4889 Encounter for other specified surgical aftercare: Secondary | ICD-10-CM

## 2016-05-06 MED ORDER — HYDROCODONE-ACETAMINOPHEN 5-325 MG PO TABS: 1.0000 | ORAL_TABLET | ORAL | 0 refills | Status: DC | PRN

## 2016-05-06 NOTE — Addendum Note (Signed)
Addended by: Arther Abbott E on: 05/06/2016 11:06 AM   Modules accepted: Orders

## 2016-05-06 NOTE — Progress Notes (Signed)
Patient ID: Rhonda Hickman, female   DOB: 15-Sep-1955, 60 y.o.   MRN: [id number]  Post op visit   Chief Complaint  Patient presents with  . Follow-up    Post op recheck on right knee, SARK, DOS 04-14-16.    Patient is still on a walker. She is having burning pain in her right leg she has a history of degenerative disc disease in the lumbar spine  She has flexion of 95 and extension full 0. She has inability to hold her leg in extension sore quadriceps seem to be firing but when she walks she complains of the knee buckling. MRI and arthroscopy showed no ligament damage  Most likely related to lumbar spine  Recommend continued therapy continue walker as needed follow-up with me in 4 weeks for reexamination

## 2016-05-06 NOTE — Patient Instructions (Signed)
Continue physical therapy, continue walker as needed, follow up in 4 weeks

## 2016-05-09 ENCOUNTER — Ambulatory Visit: Payer: Self-pay | Admitting: Orthopedic Surgery

## 2016-05-10 ENCOUNTER — Ambulatory Visit: Payer: No Typology Code available for payment source | Admitting: Physical Therapy

## 2016-05-10 ENCOUNTER — Encounter (HOSPITAL_COMMUNITY): Payer: No Typology Code available for payment source

## 2016-05-10 DIAGNOSIS — M25661 Stiffness of right knee, not elsewhere classified: Secondary | ICD-10-CM

## 2016-05-10 DIAGNOSIS — M25561 Pain in right knee: Secondary | ICD-10-CM

## 2016-05-10 NOTE — Therapy (Signed)
[facility] [place], [place], [address] Phone: [phone]   Fax:  [phone]  Physical Therapy Treatment  Patient Details  Name: Rhonda Hickman MRN: [id number] Date of Birth: 1956-04-23 Referring Provider: Arther Abbott, MD  Encounter Date: 05/10/2016      PT End of Session - 05/10/16 1451    Visit Number 5   Number of Visits 17   Date for PT Re-Evaluation 05/20/16   Authorization Time Period 04/22/16 to 06/17/16   PT Start Time 1451   PT Stop Time 1540   PT Time Calculation (min) 49 min   Activity Tolerance Patient limited by pain      Past Medical History:  Diagnosis Date  . Anxiety   . Asthma   . Bipolar 1 disorder ([place])   . Bulging lumbar disc   . Chronic pain of left knee   . COPD (chronic obstructive pulmonary disease) ([place])   . Diabetes mellitus     Past Surgical History:  Procedure Laterality Date  . ABDOMINAL HYSTERECTOMY    . CARPAL TUNNEL RELEASE Right   . KNEE ARTHROSCOPY WITH MEDIAL MENISECTOMY Right 04/14/2016   Procedure: KNEE ARTHROSCOPY WITH MEDIAL AND LATERAL MENISECTOMY, MICROFRACTURE REPAIR;  Surgeon: Carole Civil, MD;  Location: [facility];  Service: Orthopedics;  Laterality: Right;  lateral menisectomy - needs crutch training    There were no vitals filed for this visit.      Subjective Assessment - 05/10/16 1451    Subjective Patient states that she saw her MD 05/06/16 and he wants her to continue 4 more weeks. She states that she has burning into her leg which is likely from back. She states her pain before surgery was 200% and now it's 80%.    Pertinent History asthma, anxiety, COPD, DM, bipolar disorder    Limitations Walking   How long can you sit comfortably? unlimited    How long can you stand comfortably? 15 minutes    How long can you walk comfortably? with walker, unlimited; without walker only able to walk for several minutes    Patient Stated Goals walking better     Currently in Pain? Yes   Pain Score 8    Pain Location Knee   Pain Orientation Right   Pain Descriptors / Indicators Throbbing;Burning;Tightness   Pain Type Surgical pain   Pain Onset 1 to 4 weeks ago   Pain Frequency Constant                         [facility] Adult PT Treatment/Exercise - 05/10/16 0001      Knee/Hip Exercises: Aerobic   Nustep L2, seat 15, x10 min     Knee/Hip Exercises: Standing   Heel Raises 15 reps  13 reps (not 15)   Forward Step Up 10 reps;Hand Hold: 2;Step Height: 4"   Forward Step Up Limitations Requires TC's to medial knee to avoid hip IR   Rocker Board 3 minutes   Rocker Board Limitations VCs for proper form     Knee/Hip Exercises: Supine   Short Arc Quad Sets Strengthening;Right;2 sets;10 reps   Short Arc Quad Sets Limitations shaky   Heel Slides Right;5 reps  too difficult   Straight Leg Raises Strengthening;Right;10 reps     Vasopneumatic   Number Minutes Vasopneumatic  8 minutes   Vasopnuematic Location  Knee   Vasopneumatic Pressure Medium   Vasopneumatic Temperature  34  PT Short Term Goals - 04/22/16 1629      PT SHORT TERM GOAL #1   Title Pt will demo consistency and independence with her HEP to improve knee ROM and strength    Time 2   Period Weeks   Status New     PT SHORT TERM GOAL #2   Title Pt will demo improved technique with sit to stand, evident by equal weight shift, without cues from therapist x10 reps.    Time 2   Period Weeks   Status New     PT SHORT TERM GOAL #3   Title Pt will demo improved gait mechanics evident by symmetrical step length and heel toe sequencing x140ft without cues from therapist, using LRAD    Time 4   Period Weeks   Status New           PT Long Term Goals - 04/22/16 1632      PT LONG TERM GOAL #1   Title Pt will demo improved BLE strength to 5/5 MMT to improve her safety with functional tasks.   Time 8   Period Weeks   Status New     PT  LONG TERM GOAL #2   Title Pt will perform 5x sit to stand in less than 11 sec, without UE and without weight shift, to indicate improved functional strength.    Time 8   Period Weeks   Status New     PT LONG TERM GOAL #3   Title Pt will perform TUG in less than 13 sec with LRAD to indicate a decreased risk of falling in the community.    Time 8   Period Weeks   Status New     PT LONG TERM GOAL #4   Title Pt will demo improved Rt knee ROM from 0-120 degrees to improve her funcitonal performance during ambulation and sit to stand.    Time 8   Period Weeks   Status New     PT LONG TERM GOAL #5   Title Pt will report no more than 3/10 pain with daily activity to improve her comfort and tolerance with ADLs.    Time 8   Period Weeks   Status New               Plan - 05/10/16 1544    Clinical Impression Statement Patient presents today with [facility] and report that MD would like her to continue for 4 more weeks. She also stated that she doesn't want the estim anymore because she "can't feel it". She tolerated therex fair today. She is very expressive of pain throughout session, but only reported increased pain with supine heel slides. Vasopneumatic was removed 7 min early due to c/o increased pain. Attempt at less pressure was no better, so it was removed. Patient then sat at edge of bed performing LAQ (with no difficulty) repeatedly to improve circulation she said. She was able to amb from clinic without difficulty and had to be reminded to remember [facility].   Rehab Potential Good   PT Frequency 2x / week   PT Duration 8 weeks   PT Treatment/Interventions Vasopneumatic Device;Electrical Stimulation   PT Next Visit Plan Continue with menisectomy protocol per PT POC as symptoms dictate secondary to increased pain. NO ESTIM/VASO as patient nonresponsive.    PT Home Exercise Plan sit to stand x10 reps with focus on equal weight shift, knee ext stretch x30 min, scar desensitization 5-10 min per  day, walking  with RW daily    Consulted and Agree with Plan of Care Patient      Patient will benefit from skilled therapeutic intervention in order to improve the following deficits and impairments:  Abnormal gait, Decreased activity tolerance, Decreased balance, Decreased knowledge of use of DME, Decreased safety awareness, Decreased strength, Impaired flexibility, Postural dysfunction, Pain, Improper body mechanics, Increased edema, Decreased scar mobility, Decreased range of motion, Decreased endurance, Decreased mobility, Difficulty walking, Hypomobility, Increased muscle spasms  Visit Diagnosis: Acute pain of right knee  Stiffness of right knee, not elsewhere classified     Problem List Patient Active Problem List   Diagnosis Date Noted  . Derangement of posterior horn of medial meniscus of right knee   . Meniscus, lateral, derangement, right   . Arthritis of knee, right   . COPD (chronic obstructive pulmonary disease) ([place]) 03/09/2016  . DIABETES MELLITUS, TYPE II 02/16/2007  . ABUSE, COCAINE, UNSPECIFIED 02/16/2007  . ASTHMA, EXTRINSIC NOS 02/16/2007  . HOMELESSNESS, HX OF 02/16/2007    Madelyn Flavors PT 05/10/2016, 3:58 PM  [facility] [address] [place], [place], [address] Phone: [phone]   Fax:  [phone]  Name: Rhonda Hickman MRN: [id number] Date of Birth: 03-12-1956

## 2016-05-12 ENCOUNTER — Ambulatory Visit: Payer: No Typology Code available for payment source | Attending: Orthopedic Surgery | Admitting: Physical Therapy

## 2016-05-12 ENCOUNTER — Encounter (HOSPITAL_COMMUNITY): Payer: No Typology Code available for payment source | Admitting: Physical Therapy

## 2016-05-12 ENCOUNTER — Encounter: Payer: Self-pay | Admitting: Physical Therapy

## 2016-05-12 DIAGNOSIS — R2681 Unsteadiness on feet: Secondary | ICD-10-CM | POA: Insufficient documentation

## 2016-05-12 DIAGNOSIS — M25661 Stiffness of right knee, not elsewhere classified: Secondary | ICD-10-CM | POA: Insufficient documentation

## 2016-05-12 DIAGNOSIS — M25561 Pain in right knee: Secondary | ICD-10-CM | POA: Insufficient documentation

## 2016-05-12 DIAGNOSIS — R2689 Other abnormalities of gait and mobility: Secondary | ICD-10-CM | POA: Insufficient documentation

## 2016-05-12 NOTE — Therapy (Signed)
[facility] [place], [place], [address] Phone: [phone]   Fax:  [phone]  Physical Therapy Treatment  Patient Details  Name: Rhonda Hickman MRN: [id number] Date of Birth: January 06, 1956 Referring Provider: Arther Abbott, MD  Encounter Date: 05/12/2016      PT End of Session - 05/12/16 0828    Visit Number 6   Number of Visits 17   Date for PT Re-Evaluation 05/20/16   Authorization Time Period 04/22/16 to 06/17/16   PT Start Time 0807   PT Stop Time 0826   PT Time Calculation (min) 19 min   Activity Tolerance Patient limited by fatigue;Patient tolerated treatment well;Patient limited by pain   Behavior During Therapy [facility] for tasks assessed/performed;Restless      Past Medical History:  Diagnosis Date  . Anxiety   . Asthma   . Bipolar 1 disorder ([place])   . Bulging lumbar disc   . Chronic pain of left knee   . COPD (chronic obstructive pulmonary disease) ([place])   . Diabetes mellitus     Past Surgical History:  Procedure Laterality Date  . ABDOMINAL HYSTERECTOMY    . CARPAL TUNNEL RELEASE Right   . KNEE ARTHROSCOPY WITH MEDIAL MENISECTOMY Right 04/14/2016   Procedure: KNEE ARTHROSCOPY WITH MEDIAL AND LATERAL MENISECTOMY, MICROFRACTURE REPAIR;  Surgeon: Carole Civil, MD;  Location: [facility];  Service: Orthopedics;  Laterality: Right;  lateral menisectomy - needs crutch training    There were no vitals filed for this visit.      Subjective Assessment - 05/12/16 0811    Subjective Patient arrived with reports of "feeling ok" no complaints after last treatment   Pertinent History asthma, anxiety, COPD, DM, bipolar disorder    Limitations Walking   How long can you sit comfortably? unlimited    How long can you stand comfortably? 15 minutes    How long can you walk comfortably? with walker, unlimited; without walker only able to walk for several minutes    Patient Stated Goals walking better    Currently  in Pain? Yes   Pain Score 7    Pain Location Knee   Pain Orientation Right   Pain Descriptors / Indicators Aching;Sore   Pain Type Surgical pain   Pain Onset 1 to 4 weeks ago   Pain Frequency Constant   Aggravating Factors  any prolong use or activity   Pain Relieving Factors meds and rest                         [facility] Adult PT Treatment/Exercise - 05/12/16 0001      Knee/Hip Exercises: Aerobic   Nustep 80min L4, UE/LE, monitored for progression     Knee/Hip Exercises: Standing   Terminal Knee Extension Limitations x15 cues for technique, difficulty to stay on task and unable to complete   Rocker Board 2 minutes   Rocker Board Limitations ended early per patient                  PT Short Term Goals - 04/22/16 1629      PT SHORT TERM GOAL #1   Title Pt will demo consistency and independence with her HEP to improve knee ROM and strength    Time 2   Period Weeks   Status New     PT SHORT TERM GOAL #2   Title Pt will demo improved technique with sit to stand, evident by equal weight shift, without cues  from therapist x10 reps.    Time 2   Period Weeks   Status New     PT SHORT TERM GOAL #3   Title Pt will demo improved gait mechanics evident by symmetrical step length and heel toe sequencing x120ft without cues from therapist, using LRAD    Time 4   Period Weeks   Status New           PT Long Term Goals - 04/22/16 1632      PT LONG TERM GOAL #1   Title Pt will demo improved BLE strength to 5/5 MMT to improve her safety with functional tasks.   Time 8   Period Weeks   Status New     PT LONG TERM GOAL #2   Title Pt will perform 5x sit to stand in less than 11 sec, without UE and without weight shift, to indicate improved functional strength.    Time 8   Period Weeks   Status New     PT LONG TERM GOAL #3   Title Pt will perform TUG in less than 13 sec with LRAD to indicate a decreased risk of falling in the community.    Time 8    Period Weeks   Status New     PT LONG TERM GOAL #4   Title Pt will demo improved Rt knee ROM from 0-120 degrees to improve her funcitonal performance during ambulation and sit to stand.    Time 8   Period Weeks   Status New     PT LONG TERM GOAL #5   Title Pt will report no more than 3/10 pain with daily activity to improve her comfort and tolerance with ADLs.    Time 8   Period Weeks   Status New               Plan - 05/12/16 0829    Clinical Impression Statement Patient requested to end treatment early, she reported she wanted to try to complete treatment yet feeling too fatigue and not feeling she was able to due overall pain. Patient able to complete exercises without pain reported yet unable to stay focused on task and unable to complete the remaining treatment. Educated patient to rest and perform her exercises at home when able. Patient will reschedule when feeling better as requested. Current goals ongoing due to ROM, strength and pain deficts.     Rehab Potential Good   PT Frequency 2x / week   PT Duration 8 weeks   PT Treatment/Interventions ADLs/Self Care Home Management;DME Instruction;Gait training;Stair training;Functional mobility training;Therapeutic exercise;Balance training;Patient/family education;Manual techniques;Passive range of motion   PT Next Visit Plan Continue with menisectomy protocol per PT POC as symptoms dictate secondary to increased pain. NO ESTIM/VASO as patient nonresponsive.    Consulted and Agree with Plan of Care Patient      Patient will benefit from skilled therapeutic intervention in order to improve the following deficits and impairments:  Abnormal gait, Decreased activity tolerance, Decreased balance, Decreased knowledge of use of DME, Decreased safety awareness, Decreased strength, Impaired flexibility, Postural dysfunction, Pain, Improper body mechanics, Increased edema, Decreased scar mobility, Decreased range of motion, Decreased  endurance, Decreased mobility, Difficulty walking, Hypomobility, Increased muscle spasms  Visit Diagnosis: Acute pain of right knee  Stiffness of right knee, not elsewhere classified  Other abnormalities of gait and mobility  Unsteadiness on feet     Problem List Patient Active Problem List   Diagnosis Date Noted  .  Derangement of posterior horn of medial meniscus of right knee   . Meniscus, lateral, derangement, right   . Arthritis of knee, right   . COPD (chronic obstructive pulmonary disease) ([place]) 03/09/2016  . DIABETES MELLITUS, TYPE II 02/16/2007  . ABUSE, COCAINE, UNSPECIFIED 02/16/2007  . ASTHMA, EXTRINSIC NOS 02/16/2007  . HOMELESSNESS, HX OF 02/16/2007    Phillips Climes, PTA 05/12/2016, 8:40 AM  [facility] [place], [place], [address] Phone: [phone]   Fax:  [phone]  Name: Rhonda Hickman MRN: [id number] Date of Birth: 03-09-1956

## 2016-05-17 ENCOUNTER — Encounter (HOSPITAL_COMMUNITY): Payer: No Typology Code available for payment source | Admitting: Physical Therapy

## 2016-05-19 ENCOUNTER — Encounter (HOSPITAL_COMMUNITY): Payer: No Typology Code available for payment source | Admitting: Physical Therapy

## 2016-05-19 ENCOUNTER — Ambulatory Visit: Payer: No Typology Code available for payment source | Admitting: Physical Therapy

## 2016-05-24 ENCOUNTER — Ambulatory Visit: Payer: No Typology Code available for payment source | Admitting: *Deleted

## 2016-05-24 ENCOUNTER — Encounter (HOSPITAL_COMMUNITY): Payer: No Typology Code available for payment source | Admitting: Physical Therapy

## 2016-05-24 DIAGNOSIS — M25561 Pain in right knee: Secondary | ICD-10-CM

## 2016-05-24 DIAGNOSIS — R2689 Other abnormalities of gait and mobility: Secondary | ICD-10-CM

## 2016-05-24 DIAGNOSIS — M25661 Stiffness of right knee, not elsewhere classified: Secondary | ICD-10-CM

## 2016-05-24 DIAGNOSIS — R2681 Unsteadiness on feet: Secondary | ICD-10-CM

## 2016-05-24 NOTE — Therapy (Addendum)
[facility] [place], [place], [address] Phone: [phone]   Fax:  [phone]  Physical Therapy Treatment  Patient Details  Name: Rhonda Hickman MRN: [id number] Date of Birth: 03/17/1956 Referring Provider: Arther Abbott, MD  Encounter Date: 05/24/2016      PT End of Session - 05/24/16 1520    Visit Number 7   Number of Visits 17   Date for PT Re-Evaluation 05/20/16   Authorization Time Period 04/22/16 to 06/17/16   PT Start Time 1430   PT Stop Time 1505   PT Time Calculation (min) 35 min      Past Medical History:  Diagnosis Date  . Anxiety   . Asthma   . Bipolar 1 disorder ([place])   . Bulging lumbar disc   . Chronic pain of left knee   . COPD (chronic obstructive pulmonary disease) ([place])   . Diabetes mellitus     Past Surgical History:  Procedure Laterality Date  . ABDOMINAL HYSTERECTOMY    . CARPAL TUNNEL RELEASE Right   . KNEE ARTHROSCOPY WITH MEDIAL MENISECTOMY Right 04/14/2016   Procedure: KNEE ARTHROSCOPY WITH MEDIAL AND LATERAL MENISECTOMY, MICROFRACTURE REPAIR;  Surgeon: Carole Civil, MD;  Location: [facility];  Service: Orthopedics;  Laterality: Right;  lateral menisectomy - needs crutch training    There were no vitals filed for this visit.      Subjective Assessment - 05/24/16 1433    Subjective RT knee hurts 8/10 today. I left my walker in the car   Pertinent History asthma, anxiety, COPD, DM, bipolar disorder    Limitations Walking   How long can you sit comfortably? unlimited    How long can you stand comfortably? 15 minutes    How long can you walk comfortably? with walker, unlimited; without walker only able to walk for several minutes    Patient Stated Goals walking better    Currently in Pain? Yes   Pain Score 7    Pain Location Knee   Pain Orientation Right   Pain Descriptors / Indicators Sore;Aching   Pain Type Surgical pain   Pain Onset 1 to 4 weeks ago                          [facility] Adult PT Treatment/Exercise - 05/24/16 0001      Knee/Hip Exercises: Aerobic   Nustep 16  min L4, UE/LE, monitored for progression     Knee/Hip Exercises: Standing   Rocker Board 4 minutes     Knee/Hip Exercises: Seated   Long Arc Quad Right;10 reps;3 sets   [employer] Weight 2 lbs.     Modalities   Modalities --                  PT Short Term Goals - 04/22/16 1629      PT SHORT TERM GOAL #1   Title Pt will demo consistency and independence with her HEP to improve knee ROM and strength    Time 2   Period Weeks   Status New     PT SHORT TERM GOAL #2   Title Pt will demo improved technique with sit to stand, evident by equal weight shift, without cues from therapist x10 reps.    Time 2   Period Weeks   Status New     PT SHORT TERM GOAL #3   Title Pt will demo improved gait mechanics evident by symmetrical step length  and heel toe sequencing [id number] without cues from therapist, using LRAD    Time 4   Period Weeks   Status New           PT Long Term Goals - 04/22/16 1632      PT LONG TERM GOAL #1   Title Pt will demo improved BLE strength to 5/5 MMT to improve her safety with functional tasks.   Time 8   Period Weeks   Status New     PT LONG TERM GOAL #2   Title Pt will perform 5x sit to stand in less than 11 sec, without UE and without weight shift, to indicate improved functional strength.    Time 8   Period Weeks   Status New     PT LONG TERM GOAL #3   Title Pt will perform TUG in less than 13 sec with LRAD to indicate a decreased risk of falling in the community.    Time 8   Period Weeks   Status New     PT LONG TERM GOAL #4   Title Pt will demo improved Rt knee ROM from 0-120 degrees to improve her funcitonal performance during ambulation and sit to stand.    Time 8   Period Weeks   Status New     PT LONG TERM GOAL #5   Title Pt will report no more than 3/10 pain with daily activity to  improve her comfort and tolerance with ADLs.    Time 8   Period Weeks   Status New               Plan - 05/24/16 1521    Clinical Impression Statement Pt reports falling at home last week, but is doing ok. She did fair with Rx today and was able to perform a few exs for RT knee before ending Rx early due to pain and was going to ice at home. She was able to reach 95 degrees of flexion in RT knee today before stopping due to pain.   Rehab Potential Good   PT Frequency 2x / week   PT Duration 8 weeks   PT Treatment/Interventions ADLs/Self Care Home Management;DME Instruction;Gait training;Stair training;Functional mobility training;Therapeutic exercise;Balance training;Patient/family education;Manual techniques;Passive range of motion   PT Next Visit Plan Continue with menisectomy protocol per PT POC as symptoms dictate secondary to increased pain. NO ESTIM/VASO as patient nonresponsive.    PT Home Exercise Plan sit to stand x10 reps with focus on equal weight shift, knee ext stretch x30 min, scar desensitization 5-10 min per day, walking with RW daily    Consulted and Agree with Plan of Care Patient      Patient will benefit from skilled therapeutic intervention in order to improve the following deficits and impairments:  Abnormal gait, Decreased activity tolerance, Decreased balance, Decreased knowledge of use of DME, Decreased safety awareness, Decreased strength, Impaired flexibility, Postural dysfunction, Pain, Improper body mechanics, Increased edema, Decreased scar mobility, Decreased range of motion, Decreased endurance, Decreased mobility, Difficulty walking, Hypomobility, Increased muscle spasms  Visit Diagnosis: Acute pain of right knee  Stiffness of right knee, not elsewhere classified  Other abnormalities of gait and mobility  Unsteadiness on feet     Problem List Patient Active Problem List   Diagnosis Date Noted  . Derangement of posterior horn of medial  meniscus of right knee   . Meniscus, lateral, derangement, right   . Arthritis of knee, right   . COPD (chronic obstructive  pulmonary disease) ([place]) 03/09/2016  . DIABETES MELLITUS, TYPE II 02/16/2007  . ABUSE, COCAINE, UNSPECIFIED 02/16/2007  . ASTHMA, EXTRINSIC NOS 02/16/2007  . HOMELESSNESS, HX OF 02/16/2007    RAMSEUR,CHRIS PTA 05/24/2016, 3:37 PM  [facility] [place], [place], [address] Phone: [phone]   Fax:  [phone]  Name: Rhonda Hickman MRN: [id number] Date of Birth: Mar 31, 1956  PHYSICAL THERAPY DISCHARGE SUMMARY  Visits from Start of Care: 7.  Current functional level related to goals / functional outcomes: See above.   Remaining deficits: Patient not returning to PT.   Education / Equipment: HEP.  Plan: Patient agrees to discharge.  Patient goals were partially met. Patient is being discharged due to not returning since the last visit.  ?????         Mali Applegate MPT

## 2016-05-26 ENCOUNTER — Encounter (HOSPITAL_COMMUNITY): Payer: No Typology Code available for payment source

## 2016-05-30 ENCOUNTER — Encounter (HOSPITAL_COMMUNITY): Payer: No Typology Code available for payment source | Admitting: Physical Therapy

## 2016-05-31 ENCOUNTER — Encounter (HOSPITAL_COMMUNITY): Payer: No Typology Code available for payment source | Admitting: Physical Therapy

## 2016-06-06 ENCOUNTER — Ambulatory Visit (INDEPENDENT_AMBULATORY_CARE_PROVIDER_SITE_OTHER): Payer: No Typology Code available for payment source | Admitting: Orthopedic Surgery

## 2016-06-06 DIAGNOSIS — Z9889 Other specified postprocedural states: Secondary | ICD-10-CM

## 2016-06-06 DIAGNOSIS — M1712 Unilateral primary osteoarthritis, left knee: Secondary | ICD-10-CM

## 2016-06-06 DIAGNOSIS — Z4889 Encounter for other specified surgical aftercare: Secondary | ICD-10-CM

## 2016-06-06 MED ORDER — DICLOFENAC SODIUM 75 MG PO TBEC: 75.0000 mg | DELAYED_RELEASE_TABLET | Freq: Two times a day (BID) | ORAL | 5 refills | Status: DC

## 2016-06-06 MED ORDER — HYDROCODONE-ACETAMINOPHEN 5-325 MG PO TABS: 1.0000 | ORAL_TABLET | ORAL | 0 refills | Status: DC | PRN

## 2016-06-06 NOTE — Progress Notes (Signed)
Patient ID: Rhonda Hickman, female   DOB: [birth year], 60 y.o.   MRN: [id number]  Post op visit   Chief Complaint  Patient presents with  . Follow-up    4 week recheck on right knee, SARK, DOS 04-14-16.   Second visit postop arthroscopy right knee doing much better. She now using a cane she's finished her therapy she's regained her motion she has some medial knee pain. We will keep her on Norco for pain and wean that and had some Voltaren 75 mg twice a day  Return 4 weeks

## 2016-06-06 NOTE — Patient Instructions (Signed)
Home exercises   Return 4 weeks

## 2016-06-07 ENCOUNTER — Encounter (HOSPITAL_COMMUNITY): Payer: No Typology Code available for payment source

## 2016-06-09 ENCOUNTER — Encounter (HOSPITAL_COMMUNITY): Payer: No Typology Code available for payment source | Admitting: Physical Therapy

## 2016-06-14 ENCOUNTER — Encounter (HOSPITAL_COMMUNITY): Payer: No Typology Code available for payment source | Admitting: Physical Therapy

## 2016-06-16 ENCOUNTER — Encounter (HOSPITAL_COMMUNITY): Payer: No Typology Code available for payment source | Admitting: Physical Therapy

## 2016-07-13 ENCOUNTER — Ambulatory Visit (INDEPENDENT_AMBULATORY_CARE_PROVIDER_SITE_OTHER): Payer: No Typology Code available for payment source | Admitting: Orthopedic Surgery

## 2016-07-13 ENCOUNTER — Encounter: Payer: Self-pay | Admitting: Orthopedic Surgery

## 2016-07-13 DIAGNOSIS — Z9889 Other specified postprocedural states: Secondary | ICD-10-CM

## 2016-07-13 DIAGNOSIS — M1712 Unilateral primary osteoarthritis, left knee: Secondary | ICD-10-CM

## 2016-07-13 DIAGNOSIS — Z4889 Encounter for other specified surgical aftercare: Secondary | ICD-10-CM

## 2016-07-13 DIAGNOSIS — M5410 Radiculopathy, site unspecified: Secondary | ICD-10-CM

## 2016-07-13 MED ORDER — HYDROCODONE-ACETAMINOPHEN 5-325 MG PO TABS: 1.0000 | ORAL_TABLET | Freq: Four times a day (QID) | ORAL | 0 refills | Status: DC | PRN

## 2016-07-13 NOTE — Progress Notes (Signed)
Chief Complaint  Patient presents with  . Follow-up    SARK, DOS 04/14/16    The patient's knee is doing well but she is complaining of burning pain on the inside of her leg radiates across the inner thigh across the knee and down into the leg. Her knee looks good is no swelling is no effusion she has 120 of flexion  As far as the knee goes she is complete with treatment however I sent her to neurologist to get her nerve checked out  Encounter Diagnoses  Name Primary?  . Primary osteoarthritis of left knee Yes  . Aftercare following surgery   . S/P right knee arthroscopy   . H/O arthroscopic knee surgery    Meds ordered this encounter  Medications  . HYDROcodone-acetaminophen (NORCO) 5-325 MG tablet    Sig: Take 1 tablet by mouth every 6 (six) hours as needed for moderate pain.    Dispense:  30 tablet    Refill:  0

## 2016-08-02 ENCOUNTER — Telehealth: Payer: Self-pay | Admitting: Orthopedic Surgery

## 2016-08-02 ENCOUNTER — Encounter: Payer: Self-pay | Admitting: Orthopedic Surgery

## 2016-08-02 ENCOUNTER — Other Ambulatory Visit: Payer: Self-pay | Admitting: *Deleted

## 2016-08-02 DIAGNOSIS — Z9889 Other specified postprocedural states: Secondary | ICD-10-CM

## 2016-08-02 MED ORDER — HYDROCODONE-ACETAMINOPHEN 5-325 MG PO TABS: 1.0000 | ORAL_TABLET | Freq: Three times a day (TID) | ORAL | 0 refills | Status: DC | PRN

## 2016-08-02 NOTE — Progress Notes (Signed)
[place] controlled substance reporting system reviewed  

## 2016-08-02 NOTE — Telephone Encounter (Signed)
[place] controlled substance reporting system reviewed  1 Q 8 # 42 (14 DAYS)

## 2016-08-02 NOTE — Telephone Encounter (Signed)
Hydrocodone-Acetaminophen 5/325 mg  Qty 30 Tablets   ° ° °Take 1 tablet by mouth every 6 (six) hours as needed for moderate pain. °

## 2016-08-02 NOTE — Telephone Encounter (Signed)
Routing to Dr Harrison for approval 

## 2016-08-08 ENCOUNTER — Telehealth: Payer: Self-pay | Admitting: Orthopedic Surgery

## 2016-08-08 NOTE — Telephone Encounter (Signed)
Diane from [facility] called to let Dr. Aline Brochure that Dr. Jaynee Eagles does not do Consultation for Primary osteoarthritis of left knee. She said that they could do a Nerve Conduction/EMG if that is what Dr. Aline Brochure needs.  Please call and advise.  Patient has called about this also.

## 2016-08-08 NOTE — Telephone Encounter (Signed)
ROUTING TO DR HARRISON TO APPROVE 

## 2016-08-09 ENCOUNTER — Ambulatory Visit: Payer: Self-pay | Admitting: Neurology

## 2016-08-20 ENCOUNTER — Emergency Department (HOSPITAL_COMMUNITY)
Admission: EM | Admit: 2016-08-20 | Discharge: 2016-08-21 | Disposition: A | Discharge status: Home / Self Care | Payer: No Typology Code available for payment source

## 2016-08-20 NOTE — ED Notes (Signed)
Pt called X 2 with no answer. 

## 2016-08-20 NOTE — ED Notes (Signed)
Called pt x1 no answer

## 2016-08-21 ENCOUNTER — Encounter (HOSPITAL_COMMUNITY): Payer: Self-pay | Admitting: Emergency Medicine

## 2016-08-21 ENCOUNTER — Emergency Department (HOSPITAL_COMMUNITY): Payer: Self-pay

## 2016-08-21 ENCOUNTER — Emergency Department (HOSPITAL_COMMUNITY)
Admission: EM | Admit: 2016-08-21 | Discharge: 2016-08-21 | Disposition: A | Discharge status: Home / Self Care | Payer: Self-pay | Attending: Emergency Medicine | Admitting: Emergency Medicine

## 2016-08-21 DIAGNOSIS — J45909 Unspecified asthma, uncomplicated: Secondary | ICD-10-CM | POA: Insufficient documentation

## 2016-08-21 DIAGNOSIS — F17210 Nicotine dependence, cigarettes, uncomplicated: Secondary | ICD-10-CM | POA: Insufficient documentation

## 2016-08-21 DIAGNOSIS — J449 Chronic obstructive pulmonary disease, unspecified: Secondary | ICD-10-CM | POA: Insufficient documentation

## 2016-08-21 DIAGNOSIS — E119 Type 2 diabetes mellitus without complications: Secondary | ICD-10-CM | POA: Insufficient documentation

## 2016-08-21 DIAGNOSIS — J209 Acute bronchitis, unspecified: Secondary | ICD-10-CM | POA: Insufficient documentation

## 2016-08-21 DIAGNOSIS — J069 Acute upper respiratory infection, unspecified: Secondary | ICD-10-CM | POA: Insufficient documentation

## 2016-08-21 LAB — CBG MONITORING, ED: Glucose-Capillary: 159 mg/dL — ABNORMAL HIGH (ref 65–99)

## 2016-08-21 MED ORDER — ALBUTEROL SULFATE (2.5 MG/3ML) 0.083% IN NEBU
INHALATION_SOLUTION | RESPIRATORY_TRACT | Status: AC
  Administered 2016-08-21: 2.5 mg
  Filled 2016-08-21: qty 12

## 2016-08-21 MED ORDER — IOPAMIDOL (ISOVUE-300) INJECTION 61%
INTRAVENOUS | Status: AC
  Filled 2016-08-21: qty 30

## 2016-08-21 MED ORDER — ALBUTEROL (5 MG/ML) CONTINUOUS INHALATION SOLN: 10.0000 mg/h | INHALATION_SOLUTION | RESPIRATORY_TRACT | Status: DC

## 2016-08-21 MED ORDER — DEXAMETHASONE SODIUM PHOSPHATE 4 MG/ML IJ SOLN
8.0000 mg | Freq: Once | INTRAMUSCULAR | Status: AC
  Administered 2016-08-21: 8 mg via INTRAMUSCULAR
  Filled 2016-08-21: qty 2

## 2016-08-21 MED ORDER — AZITHROMYCIN 250 MG PO TABS: ORAL_TABLET | ORAL | 0 refills | Status: DC

## 2016-08-21 MED ORDER — HYDROCODONE-HOMATROPINE 5-1.5 MG/5ML PO SYRP: 5.0000 mL | ORAL_SOLUTION | Freq: Four times a day (QID) | ORAL | 0 refills | Status: DC | PRN

## 2016-08-21 MED ORDER — IPRATROPIUM-ALBUTEROL 0.5-2.5 (3) MG/3ML IN SOLN
3.0000 mL | Freq: Once | RESPIRATORY_TRACT | Status: DC
  Filled 2016-08-21: qty 3

## 2016-08-21 NOTE — ED Triage Notes (Signed)
Patient complains of cough and sinus drainage. Pt here for same yesterday.

## 2016-08-21 NOTE — ED Notes (Signed)
Resp paged for breathing treatment.  

## 2016-08-21 NOTE — Discharge Instructions (Signed)
Your chest xray is negative for acute problem. Your exam favors acute bronchitis and Upper Respiratory Infection. Please use zithromax and hycodan(cough) daily. Use albuterol at home every 4 hours. See your MD for additional evaluation if not improving, or return to the Emergency Dept.

## 2016-08-21 NOTE — ED Notes (Signed)
No answer in waiting room 

## 2016-08-21 NOTE — ED Provider Notes (Signed)
[facility] DEPT Provider Note   CSN: [id number] Arrival date & time: 08/21/16  [id number]  By signing my name below, I, Hilbert Odor, attest that this documentation has been prepared under the direction and in the presence of Lily Kocher, [place]. Electronically Signed: Hilbert Odor, Scribe. 08/21/16. 8:56 AM History   Chief Complaint Chief Complaint  Patient presents with  . Cough  . Generalized Body Aches      Cough  This is a new problem. The current episode started more than 2 days ago. The problem occurs constantly. The problem has been gradually worsening. The cough is productive of sputum. The maximum temperature recorded prior to her arrival was 102 to 102.9 F. The fever has been present for less than 1 day. Associated symptoms include shortness of breath and wheezing. Her past medical history is significant for pneumonia, COPD and asthma.    HPI Comments: Rhonda Hickman is a 61 y.o. female with hx of PNA, asthma, and COPD, presents to the Emergency Department complaining of a productive cough for the past week. She states that her cough is productive of phlegm with a white appearance. She also reports associated wheezing, SOB, and fever of Tmax 102 since last night. The patient believed that this could have been PNA at first due to her history. She currently uses a nebulizer at home. She reports using it more than typical in the past few days.   Past Medical History:  Diagnosis Date  . Anxiety   . Asthma   . Bipolar 1 disorder ([place])   . Bulging lumbar disc   . Chronic pain of left knee   . COPD (chronic obstructive pulmonary disease) ([place])   . Diabetes mellitus     Patient Active Problem List   Diagnosis Date Noted  . Derangement of posterior horn of medial meniscus of right knee   . Meniscus, lateral, derangement, right   . Arthritis of knee, right   . COPD (chronic obstructive pulmonary disease) ([place]) 03/09/2016  . DIABETES MELLITUS, TYPE II 02/16/2007  .  ABUSE, COCAINE, UNSPECIFIED 02/16/2007  . ASTHMA, EXTRINSIC NOS 02/16/2007  . HOMELESSNESS, HX OF 02/16/2007    Past Surgical History:  Procedure Laterality Date  . ABDOMINAL HYSTERECTOMY    . CARPAL TUNNEL RELEASE Right   . KNEE ARTHROSCOPY WITH MEDIAL MENISECTOMY Right 04/14/2016   Procedure: KNEE ARTHROSCOPY WITH MEDIAL AND LATERAL MENISECTOMY, MICROFRACTURE REPAIR;  Surgeon: Carole Civil, MD;  Location: [facility];  Service: Orthopedics;  Laterality: Right;  lateral menisectomy - needs crutch training    OB History    No data available       Home Medications    Prior to Admission medications   Medication Sig Start Date End Date Taking? Authorizing Provider  albuterol (PROVENTIL HFA;VENTOLIN HFA) 108 (90 BASE) MCG/ACT inhaler Inhale 2 puffs into the lungs every 6 (six) hours as needed for wheezing.    Historical Provider, MD  albuterol (PROVENTIL) (2.5 MG/3ML) 0.083% nebulizer solution Take 2.5 mg by nebulization every 6 (six) hours as needed for shortness of breath.     Historical Provider, MD  amLODipine (NORVASC) 5 MG tablet Take 5 mg by mouth daily.    Historical Provider, MD  diclofenac (VOLTAREN) 75 MG EC tablet Take 1 tablet (75 mg total) by mouth 2 (two) times daily with a meal. 06/06/16   Carole Civil, MD  diclofenac sodium (VOLTAREN) 1 % GEL Apply 2 g topically 4 (four) times daily. 02/29/16   Lily Kocher, PA-C  divalproex (DEPAKOTE ER) 500 MG 24 hr tablet Take 1,000-1,500 mg by mouth 2 (two) times daily. 2 tablets in the morning and 3 tablets at bedtime.    Historical Provider, MD  gabapentin (NEURONTIN) 300 MG capsule Take 600 mg by mouth 3 (three) times daily as needed (pain).     Historical Provider, MD  HYDROcodone-acetaminophen (NORCO) 5-325 MG tablet Take 1 tablet by mouth every 8 (eight) hours as needed for moderate pain. 08/02/16   Carole Civil, MD  insulin detemir (LEVEMIR) 100 UNIT/ML injection Inject 10 Units into the skin at bedtime.    Historical  Provider, MD  insulin NPH-regular (NOVOLIN 70/30) (70-30) 100 UNIT/ML injection Inject 50 Units into the skin 3 (three) times daily.     Historical Provider, MD  oxybutynin (DITROPAN) 5 MG tablet Take 5 mg by mouth 2 (two) times daily.    Historical Provider, MD  risperiDONE (RISPERDAL) 0.25 MG tablet Take 0.25 mg by mouth at bedtime.    Historical Provider, MD  traZODone (DESYREL) 100 MG tablet Take 200 mg by mouth at bedtime as needed for sleep.     Historical Provider, MD    Family History No family history on file.  Social History Social History  Substance Use Topics  . Smoking status: Current Some Day Smoker    Packs/day: 0.50    Types: Cigarettes  . Smokeless tobacco: Never Used  . Alcohol use No     Allergies   Aspirin and Ibuprofen   Review of Systems Review of Systems  Constitutional: Positive for fever.  Respiratory: Positive for cough, shortness of breath and wheezing.   Gastrointestinal: Negative for diarrhea and vomiting.  All other systems reviewed and are negative.    Physical Exam Updated Vital Signs BP 123/69 (BP Location: Right Arm)   Pulse 65   Temp 98.8 F (37.1 C) (Oral)   Resp 18   Ht 6' (1.829 m)   Wt 290 lb (131.5 kg)   SpO2 100%   BMI 39.33 kg/m   Physical Exam  Constitutional: She is oriented to person, place, and time. She appears well-developed and well-nourished.  HENT:  Head: Normocephalic and atraumatic.  Airway is patent. Nasal congestion is present.  Neck:  Trachea midline.  Cardiovascular: Normal rate.   No rubs or gallops.  Pulmonary/Chest: Effort normal.  Bilateral expiratory wheezes. Diffused scattered rhonchi.  Musculoskeletal:  Capillary refill is less than 2 seconds.  Neurological: She is alert and oriented to person, place, and time.  Skin: Skin is warm and dry. Capillary refill takes less than 2 seconds.  Psychiatric: She has a normal mood and affect.  Nursing note and vitals reviewed.    ED Treatments /  Results  DIAGNOSTIC STUDIES: Oxygen Saturation is 98% on RA, normal by my interpretation.    COORDINATION OF CARE: 8:21 AM Discussed treatment plan with pt at bedside and pt agreed to plan. I will start her on albuterol and check her X-ray.  Labs (all labs ordered are listed, but only abnormal results are displayed) Labs Reviewed  CBG MONITORING, ED - Abnormal; Notable for the following:       Result Value   Glucose-Capillary 159 (*)    All other components within normal limits    EKG  EKG Interpretation None       Radiology Dg Chest 2 View  Result Date: 08/21/2016 CLINICAL DATA:  Cough and chills for 1 week EXAM: CHEST  2 VIEW COMPARISON:  06/15/2015 FINDINGS: Heart and mediastinal contours  are within normal limits. No focal opacities or effusions. No acute bony abnormality. IMPRESSION: No active cardiopulmonary disease. Electronically Signed   By: Rolm Baptise M.D.   On: 08/21/2016 08:42    Procedures Procedures (including critical care time)  Medications Ordered in ED Medications  ipratropium-albuterol (DUONEB) 0.5-2.5 (3) MG/3ML nebulizer solution 3 mL (3 mLs Nebulization Not Given 08/21/16 0843)  albuterol (PROVENTIL,VENTOLIN) solution continuous neb (10 mg/hr Nebulization Not Given 08/21/16 0842)  albuterol (PROVENTIL) (2.5 MG/3ML) 0.083% nebulizer solution (2.5 mg  Given 08/21/16 [id number])     Initial Impression / Assessment and Plan / ED Course  I have reviewed the triage vital signs and the nursing notes.  Pertinent labs & imaging results that were available during my care of the patient were reviewed by me and considered in my medical decision making (see chart for details).     *I have reviewed nursing notes, vital signs, and all appropriate lab and imaging results for this patient.**  Final Clinical Impressions(s) / ED Diagnoses MDM Patient having cough, wheezing, congestion, and fever. She has a hx of asthma and copd. She will be treated with albuterol. Her  CXR is pending. Pulse oximeter 98% on RA at this time. Chest x-ray is negative for acute cardiopulmonary disease. The patient is not in acute distress at this time. The examination favors bronchitis and upper respiratory infection. The pulse oximetry is 100% on room air.  The patient will be treated with Zithromax and Hycodan. I've asked the patient to increase fluids to wash hands frequently. The patient is to follow-up with the primary physician or return to the emergency department if not improving. Patient is in agreement with this plan.    Final diagnoses:  Acute bronchitis, unspecified organism  Upper respiratory tract infection, unspecified type    New Prescriptions Discharge Medication List as of 08/21/2016 11:51 AM    START taking these medications   Details  azithromycin (ZITHROMAX) 250 MG tablet 2 tabs day one, then one daily until all taken., Print    HYDROcodone-homatropine (HYCODAN) 5-1.5 MG/5ML syrup Take 5 mLs by mouth every 6 (six) hours as needed., Starting Sun 08/21/2016, Print       **I personally performed the services described in this documentation, which was scribed in my presence. The recorded information has been reviewed and is accurate.Lily Kocher, PA-C 08/24/16 1711    Elnora Morrison, MD 08/29/16 1002

## 2016-08-23 NOTE — Telephone Encounter (Signed)
Patient is following up on referral question as noted - reminds that she is under the 100% [employer] discount - through 09/04/16 only; therefore, asking if the referral be made to a provider who accepts the discount? Ph# [phone]

## 2016-08-27 ENCOUNTER — Emergency Department (HOSPITAL_COMMUNITY)
Admission: EM | Admit: 2016-08-27 | Discharge: 2016-08-28 | Disposition: A | Discharge status: Home / Self Care | Payer: No Typology Code available for payment source | Attending: Emergency Medicine | Admitting: Emergency Medicine

## 2016-08-27 ENCOUNTER — Encounter (HOSPITAL_COMMUNITY): Payer: Self-pay | Admitting: Emergency Medicine

## 2016-08-27 ENCOUNTER — Emergency Department (HOSPITAL_COMMUNITY): Payer: No Typology Code available for payment source

## 2016-08-27 DIAGNOSIS — J029 Acute pharyngitis, unspecified: Secondary | ICD-10-CM | POA: Insufficient documentation

## 2016-08-27 DIAGNOSIS — J441 Chronic obstructive pulmonary disease with (acute) exacerbation: Secondary | ICD-10-CM | POA: Insufficient documentation

## 2016-08-27 DIAGNOSIS — Z79899 Other long term (current) drug therapy: Secondary | ICD-10-CM | POA: Insufficient documentation

## 2016-08-27 DIAGNOSIS — Z794 Long term (current) use of insulin: Secondary | ICD-10-CM | POA: Insufficient documentation

## 2016-08-27 DIAGNOSIS — F17210 Nicotine dependence, cigarettes, uncomplicated: Secondary | ICD-10-CM | POA: Insufficient documentation

## 2016-08-27 DIAGNOSIS — J45909 Unspecified asthma, uncomplicated: Secondary | ICD-10-CM | POA: Insufficient documentation

## 2016-08-27 DIAGNOSIS — E119 Type 2 diabetes mellitus without complications: Secondary | ICD-10-CM | POA: Insufficient documentation

## 2016-08-27 LAB — RAPID STREP SCREEN (MED CTR MEBANE ONLY): Streptococcus, Group A Screen (Direct): NEGATIVE

## 2016-08-27 MED ORDER — ALBUTEROL SULFATE (2.5 MG/3ML) 0.083% IN NEBU
2.5000 mg | INHALATION_SOLUTION | Freq: Once | RESPIRATORY_TRACT | Status: AC
  Administered 2016-08-27: 2.5 mg via RESPIRATORY_TRACT
  Filled 2016-08-27: qty 3

## 2016-08-27 MED ORDER — IPRATROPIUM-ALBUTEROL 0.5-2.5 (3) MG/3ML IN SOLN
3.0000 mL | Freq: Once | RESPIRATORY_TRACT | Status: AC
  Administered 2016-08-27: 3 mL via RESPIRATORY_TRACT
  Filled 2016-08-27: qty 3

## 2016-08-27 MED ORDER — LIDOCAINE VISCOUS 2 % MT SOLN
15.0000 mL | Freq: Once | OROMUCOSAL | Status: AC
  Administered 2016-08-27: 15 mL via OROMUCOSAL
  Filled 2016-08-27: qty 15

## 2016-08-27 NOTE — ED Triage Notes (Signed)
Pt with difficulty swallowing x 2 days. States she "can't hardly swallow her saliva" and that anything she puts into her mouth burns. Pt with garbled speech.

## 2016-08-27 NOTE — ED Notes (Signed)
RT called to administer breathing treatment.

## 2016-08-28 ENCOUNTER — Emergency Department (HOSPITAL_COMMUNITY): Payer: No Typology Code available for payment source

## 2016-08-28 LAB — CBC WITH DIFFERENTIAL/PLATELET
Basophils Absolute: 0 10*3/uL (ref 0.0–0.1)
Basophils Relative: 0 %
Eosinophils Absolute: 0.2 10*3/uL (ref 0.0–0.7)
Eosinophils Relative: 2 %
HEMATOCRIT: 36.7 % (ref 36.0–46.0)
HEMOGLOBIN: 12.4 g/dL (ref 12.0–15.0)
LYMPHS PCT: 38 %
Lymphs Abs: 5 10*3/uL — ABNORMAL HIGH (ref 0.7–4.0)
MCH: 28.4 pg (ref 26.0–34.0)
MCHC: 33.8 g/dL (ref 30.0–36.0)
MCV: 84.2 fL (ref 78.0–100.0)
Monocytes Absolute: 1.1 10*3/uL — ABNORMAL HIGH (ref 0.1–1.0)
Monocytes Relative: 9 %
NEUTROS ABS: 6.6 10*3/uL (ref 1.7–7.7)
NEUTROS PCT: 51 %
Platelets: 275 10*3/uL (ref 150–400)
RBC: 4.36 MIL/uL (ref 3.87–5.11)
RDW: 15.3 % (ref 11.5–15.5)
WBC: 12.9 10*3/uL — ABNORMAL HIGH (ref 4.0–10.5)

## 2016-08-28 LAB — BASIC METABOLIC PANEL
ANION GAP: 10 (ref 5–15)
BUN: 11 mg/dL (ref 6–20)
CALCIUM: 8.9 mg/dL (ref 8.9–10.3)
CHLORIDE: 101 mmol/L (ref 101–111)
CO2: 25 mmol/L (ref 22–32)
Creatinine, Ser: 0.81 mg/dL (ref 0.44–1.00)
GFR calc Af Amer: >60 mL/min (ref >60)
GFR calc non Af Amer: >60 mL/min (ref >60)
GLUCOSE: 172 mg/dL — AB (ref 65–99)
POTASSIUM: 3.6 mmol/L (ref 3.5–5.1)
Sodium: 136 mmol/L (ref 135–145)

## 2016-08-28 MED ORDER — PREDNISONE 20 MG PO TABS
40.0000 mg | ORAL_TABLET | Freq: Once | ORAL | Status: AC
  Administered 2016-08-28: 40 mg via ORAL
  Filled 2016-08-28: qty 2

## 2016-08-28 MED ORDER — NYSTATIN 100000 UNIT/ML MT SUSP: 500000.0000 [IU] | Freq: Four times a day (QID) | OROMUCOSAL | 0 refills | Status: DC

## 2016-08-28 MED ORDER — ALBUTEROL (5 MG/ML) CONTINUOUS INHALATION SOLN
10.0000 mg/h | INHALATION_SOLUTION | Freq: Once | RESPIRATORY_TRACT | Status: AC
  Administered 2016-08-28: 10 mg/h via RESPIRATORY_TRACT
  Filled 2016-08-28: qty 20

## 2016-08-28 MED ORDER — IPRATROPIUM-ALBUTEROL 0.5-2.5 (3) MG/3ML IN SOLN
3.0000 mL | Freq: Once | RESPIRATORY_TRACT | Status: DC
  Filled 2016-08-28: qty 3

## 2016-08-28 MED ORDER — PREDNISONE 50 MG PO TABS: ORAL_TABLET | ORAL | 0 refills | Status: DC

## 2016-08-28 MED ORDER — BENZONATATE 100 MG PO CAPS
200.0000 mg | ORAL_CAPSULE | Freq: Once | ORAL | Status: AC
  Administered 2016-08-28: 200 mg via ORAL
  Filled 2016-08-28: qty 2

## 2016-08-28 MED ORDER — BENZONATATE 100 MG PO CAPS: 100.0000 mg | ORAL_CAPSULE | Freq: Three times a day (TID) | ORAL | 0 refills | Status: DC

## 2016-08-28 NOTE — ED Provider Notes (Signed)
Assumed care from [place].  Patient presents with progressive shortness of breath and wheezing over the past week after being treated for bronchitis with Zithromax. Also has sore throat and mild pain. No fever or chest pain.  Patient receiving nebulizer due to persistent wheezing. Chest x-ray shows no infiltrate. Soft tissue neck x-rays negative. She does have questionable thrush on her tongue.   Patient continues to have wheezing and rhonchi. However she states she feels better and wishes to go home. She is ambulatory without desaturation. She refuses additional nebulizer. No hypoxia or increased work of breathing.   Will discharge with prednisone course, bronchodilators. Recently completed antibiotics. Will given nystatin for possible thrush. Cautioned to watch blood sugars closely while on steroids. Return precautions discussed.  BP 131/68   Pulse 75   Temp 98.2 F (36.8 C) (Oral)   Resp 17   Ht 6' (1.829 m)   Wt 300 lb (136.1 kg)   SpO2 98%   BMI 40.69 kg/m     Ezequiel Essex, MD 08/28/16 [phone]

## 2016-08-28 NOTE — ED Notes (Signed)
Pulse Ox while ambulating: 94-96

## 2016-08-28 NOTE — ED Notes (Signed)
Pt being transported to x-ray at this time.  

## 2016-08-28 NOTE — ED Notes (Signed)
Pt asked for more graham crackers- pt given 2 packs of graham crackers and soda.  Dr Wyvonnia Dusky at bedside.

## 2016-08-28 NOTE — Discharge Instructions (Signed)
Use your inhalers as prescribed. Monitor your blood sugars closely while you're taking the steroids. You should use your inhaler every 6 hours for the next 2 days. Return to the ED develop increased work of breathing, chest pain, any other concerns.

## 2016-08-28 NOTE — ED Provider Notes (Signed)
[facility] DEPT Provider Note   CSN: [id number] Arrival date & time: 08/27/16  2108     History   Chief Complaint Chief Complaint  Patient presents with  . Dysphagia    HPI Rhonda Hickman is a 61 y.o. female with past medical history as outlined below, most significant for asthma and diabetes, presenting with persistent shortness of breath and wheezing despite completing her zithromax yesterday for acute bronchitis she was seen here for one week ago.  Additionally, she has developed a severe sore throat and mouth pain, making it difficult to swallow her saliva, stating anything she tries to put in her mouth causes severe burning of her tongue and throat.  She denies fevers or chills, nasal congestion, post nasal drip.  She has developed a swollen nodule under her chin that is tender.  She denies chest pain, abdominal pain, n/v/d.  She has found no alleviators.  She has not been monitoring her diabetes since she was seen here last week.  The history is provided by the patient.    Past Medical History:  Diagnosis Date  . Anxiety   . Asthma   . Bipolar 1 disorder ([place])   . Bulging lumbar disc   . Chronic pain of left knee   . COPD (chronic obstructive pulmonary disease) ([place])   . Diabetes mellitus     Patient Active Problem List   Diagnosis Date Noted  . Derangement of posterior horn of medial meniscus of right knee   . Meniscus, lateral, derangement, right   . Arthritis of knee, right   . COPD (chronic obstructive pulmonary disease) ([place]) 03/09/2016  . DIABETES MELLITUS, TYPE II 02/16/2007  . ABUSE, COCAINE, UNSPECIFIED 02/16/2007  . ASTHMA, EXTRINSIC NOS 02/16/2007  . HOMELESSNESS, HX OF 02/16/2007    Past Surgical History:  Procedure Laterality Date  . ABDOMINAL HYSTERECTOMY    . CARPAL TUNNEL RELEASE Right   . KNEE ARTHROSCOPY WITH MEDIAL MENISECTOMY Right 04/14/2016   Procedure: KNEE ARTHROSCOPY WITH MEDIAL AND LATERAL MENISECTOMY, MICROFRACTURE REPAIR;  Surgeon:  Carole Civil, MD;  Location: [facility];  Service: Orthopedics;  Laterality: Right;  lateral menisectomy - needs crutch training    OB History    No data available       Home Medications    Prior to Admission medications   Medication Sig Start Date End Date Taking? Authorizing Provider  albuterol (PROVENTIL HFA;VENTOLIN HFA) 108 (90 BASE) MCG/ACT inhaler Inhale 2 puffs into the lungs every 6 (six) hours as needed for wheezing.   Yes Historical Provider, MD  albuterol (PROVENTIL) (2.5 MG/3ML) 0.083% nebulizer solution Take 2.5 mg by nebulization every 6 (six) hours as needed for shortness of breath.    Yes Historical Provider, MD  amLODipine (NORVASC) 5 MG tablet Take 5 mg by mouth daily.   Yes Historical Provider, MD  Dextromethorphan-Guaifenesin (CHILDRENS COUGH PO) Take 10-15 mLs by mouth once as needed (for cough).   Yes Historical Provider, MD  diclofenac (VOLTAREN) 75 MG EC tablet Take 1 tablet (75 mg total) by mouth 2 (two) times daily with a meal. 06/06/16  Yes Carole Civil, MD  divalproex (DEPAKOTE ER) 500 MG 24 hr tablet Take 1,000-1,500 mg by mouth 2 (two) times daily. 2 tablets in the morning and 3 tablets at bedtime.   Yes Historical Provider, MD  gabapentin (NEURONTIN) 300 MG capsule Take 600 mg by mouth 3 (three) times daily as needed (pain).    Yes Historical Provider, MD  HYDROcodone-acetaminophen (NORCO) 5-325 MG  tablet Take 1 tablet by mouth every 8 (eight) hours as needed for moderate pain. Patient taking differently: Take 0.5-1 tablets by mouth every 8 (eight) hours as needed for moderate pain.  08/02/16  Yes Carole Civil, MD  insulin NPH-regular (NOVOLIN 70/30) (70-30) 100 UNIT/ML injection Inject 50 Units into the skin 3 (three) times daily.    Yes Historical Provider, MD  oxybutynin (DITROPAN) 5 MG tablet Take 5 mg by mouth 2 (two) times daily.   Yes Historical Provider, MD  risperiDONE (RISPERDAL) 0.25 MG tablet Take 0.25 mg by mouth at bedtime.   Yes  Historical Provider, MD  traZODone (DESYREL) 100 MG tablet Take 200 mg by mouth at bedtime as needed for sleep.    Yes Historical Provider, MD  azithromycin (ZITHROMAX) 250 MG tablet 2 tabs day one, then one daily until all taken. Patient not taking: Reported on 08/27/2016 08/21/16   Lily Kocher, PA-C  HYDROcodone-homatropine [facility]) 5-1.5 MG/5ML syrup Take 5 mLs by mouth every 6 (six) hours as needed. Patient not taking: Reported on 08/27/2016 08/21/16   Lily Kocher, PA-C    Family History No family history on file.  Social History Social History  Substance Use Topics  . Smoking status: Current Some Day Smoker    Packs/day: 0.50    Types: Cigarettes  . Smokeless tobacco: Never Used  . Alcohol use No     Allergies   Aspirin and Ibuprofen   Review of Systems Review of Systems  Constitutional: Negative for chills and fever.  HENT: Positive for sore throat. Negative for congestion.        Negative except as mentioned in HPI.   Eyes: Negative.   Respiratory: Positive for chest tightness, shortness of breath and wheezing. Negative for stridor.   Cardiovascular: Negative for chest pain.  Gastrointestinal: Negative for abdominal pain, nausea and vomiting.  Genitourinary: Negative.   Musculoskeletal: Negative for arthralgias, joint swelling and neck pain.  Skin: Negative.  Negative for rash and wound.  Neurological: Negative for dizziness, weakness, light-headedness, numbness and headaches.  Psychiatric/Behavioral: Negative.      Physical Exam Updated Vital Signs BP 115/57   Pulse 80   Temp 98.2 F (36.8 C) (Oral)   Resp 17   Ht 6' (1.829 m)   Wt 136.1 kg   SpO2 98%   BMI 40.69 kg/m   Physical Exam  Constitutional: She appears well-developed and well-nourished.  HENT:  Head: Normocephalic and atraumatic.  Mouth/Throat: Uvula is midline. No oral lesions. No trismus in the jaw. No uvula swelling. Posterior oropharyngeal erythema present. No tonsillar abscesses.    Posterior pharynx and tongue appear erythematous, areas of white adherent patches on tongue.  No peritonsillar abscess.    Eyes: Conjunctivae are normal.  Neck: Normal range of motion. No neck rigidity. No edema and no erythema present.  Cardiovascular: Normal rate, regular rhythm, normal heart sounds and intact distal pulses.   Pulmonary/Chest: Effort normal. No stridor. She has decreased breath sounds. She has wheezes.  Inspiratory and expiratory wheezing throughout all lung fields.   Abdominal: Soft. Bowel sounds are normal. There is no tenderness.  Musculoskeletal: Normal range of motion.  Lymphadenopathy:       Head (right side): Submandibular adenopathy present.  Neurological: She is alert.  Skin: Skin is warm and dry.  Psychiatric: She has a normal mood and affect.  Nursing note and vitals reviewed.    ED Treatments / Results  Labs (all labs ordered are listed, but only abnormal results are  displayed) Labs Reviewed  CBC WITH DIFFERENTIAL/PLATELET - Abnormal; Notable for the following:       Result Value   WBC 12.9 (*)    Lymphs Abs 5.0 (*)    Monocytes Absolute 1.1 (*)    All other components within normal limits  BASIC METABOLIC PANEL - Abnormal; Notable for the following:    Glucose, Bld 172 (*)    All other components within normal limits  RAPID STREP SCREEN (NOT AT [facility])  CULTURE, GROUP A STREP [facility])    EKG  EKG Interpretation None       Radiology Dg Chest 2 View  Result Date: 08/28/2016 CLINICAL DATA:  Initial evaluation for productive cough with shortness of breath and wheezing for 1 week. History of asthma, COPD, diabetes. EXAM: CHEST  2 VIEW COMPARISON:  Prior radiograph from 08/21/2016. FINDINGS: Mild cardiomegaly, stable. Mediastinal silhouette within normal limits. Lungs normally inflated. No pulmonary edema or pleural effusion. No pneumothorax. Mild scattered peribronchial thickening, which may related to asthma, COPD, or possibly acute bronchiolitis.  No pneumothorax. No acute osseus abnormality. IMPRESSION: 1. Mild diffuse scattered peribronchial thickening. While this finding may be related history of asthma and/or COPD, possible acute bronchiolitis could be considered given the history of cough and wheezing. No consolidative opacity to suggest pneumonia. 2. No other active cardiopulmonary disease. Electronically Signed   By: Jeannine Boga M.D.   On: 08/28/2016 00:28    Procedures Procedures (including critical care time)  Medications Ordered in ED Medications  ipratropium-albuterol (DUONEB) 0.5-2.5 (3) MG/3ML nebulizer solution 3 mL (3 mLs Nebulization Given 08/27/16 2303)  albuterol (PROVENTIL) (2.5 MG/3ML) 0.083% nebulizer solution 2.5 mg (2.5 mg Nebulization Given 08/27/16 2303)  lidocaine (XYLOCAINE) 2 % viscous mouth solution 15 mL (15 mLs Mouth/Throat Given 08/27/16 2300)  benzonatate (TESSALON) capsule 200 mg (200 mg Oral Given 08/28/16 0037)  albuterol (PROVENTIL,VENTOLIN) solution continuous neb (10 mg/hr Nebulization Given 08/28/16 0056)  predniSONE (DELTASONE) tablet 40 mg (40 mg Oral Given 08/28/16 0122)     Initial Impression / Assessment and Plan / ED Course  I have reviewed the triage vital signs and the nursing notes.  Pertinent labs & imaging results that were available during my care of the patient were reviewed by me and considered in my medical decision making (see chart for details).     Pt was given albuterol/atrovent neb with no improvement in lung exam.  Prednisone 40 mg po .  Repeat of albuterol 10 mg over 1 hour ordered.  She was given viscous lidocaine gargle and obtained relief of mouth pain.  Has white coating on tongue but adherent, not suggestive of oral thrush.  Pt has completed z pack ytd from previous visit, pt stating she did not get any improvement with this tx.  Will need reassessment of breathing after neb completed.  May need admission if she fails outpatient tx.    Discussed with Dr. Wyvonnia Dusky  who will follow and dispo pt.   Final Clinical Impressions(s) / ED Diagnoses   Final diagnoses:  COPD exacerbation ([place])  Pharyngitis, unspecified etiology    New Prescriptions New Prescriptions   No medications on file     Evalee Jefferson, Hershal Coria 08/28/16 0127    Ezequiel Essex, MD 08/28/16 [phone]

## 2016-08-28 NOTE — ED Notes (Signed)
Pt has ate and swallowed 2 graham crackers and water without any problems other than throat pain, no choking or gurgling noted.

## 2016-08-29 LAB — CBG MONITORING, ED: Glucose-Capillary: 310 mg/dL — ABNORMAL HIGH (ref 65–99)

## 2016-08-30 LAB — CULTURE, GROUP A STREP (THRC)

## 2016-09-19 ENCOUNTER — Telehealth: Payer: Self-pay | Admitting: Orthopedic Surgery

## 2016-09-19 NOTE — Telephone Encounter (Signed)
REFERRAL NEEDED TO PAIN MANAGEMENT

## 2016-09-19 NOTE — Telephone Encounter (Signed)
Routing to Dr Harrison for approval 

## 2016-09-19 NOTE — Telephone Encounter (Signed)
Patient requests refill:  HYDROcodone-acetaminophen (NORCO) 5-325 MG tablet 42 tablet  - patient has no follow up appointment scheduled at this time.

## 2016-09-20 ENCOUNTER — Telehealth: Payer: Self-pay | Admitting: Orthopedic Surgery

## 2016-09-20 NOTE — Addendum Note (Signed)
Addended by: Baldomero Lamy B on: 09/20/2016 10:54 AM   Modules accepted: Orders

## 2016-09-20 NOTE — Telephone Encounter (Signed)
Patient is asking if Dr. Aline Brochure has referred her to another neurologist and if he will please give her a refill on her pain medicine.

## 2016-09-20 NOTE — Telephone Encounter (Signed)
Dr Aline Brochure declined to fill pain medicine, new referral sent to neurology

## 2017-01-17 ENCOUNTER — Encounter (HOSPITAL_COMMUNITY): Payer: Self-pay | Admitting: *Deleted

## 2017-01-17 ENCOUNTER — Emergency Department (HOSPITAL_COMMUNITY)
Admission: EM | Admit: 2017-01-17 | Discharge: 2017-01-17 | Disposition: A | Discharge status: Home / Self Care | Payer: Self-pay | Attending: Emergency Medicine | Admitting: Emergency Medicine

## 2017-01-17 ENCOUNTER — Emergency Department (HOSPITAL_COMMUNITY): Payer: Self-pay

## 2017-01-17 DIAGNOSIS — F17210 Nicotine dependence, cigarettes, uncomplicated: Secondary | ICD-10-CM | POA: Insufficient documentation

## 2017-01-17 DIAGNOSIS — J45909 Unspecified asthma, uncomplicated: Secondary | ICD-10-CM | POA: Insufficient documentation

## 2017-01-17 DIAGNOSIS — Y9301 Activity, walking, marching and hiking: Secondary | ICD-10-CM | POA: Insufficient documentation

## 2017-01-17 DIAGNOSIS — Z79899 Other long term (current) drug therapy: Secondary | ICD-10-CM | POA: Insufficient documentation

## 2017-01-17 DIAGNOSIS — Y929 Unspecified place or not applicable: Secondary | ICD-10-CM | POA: Insufficient documentation

## 2017-01-17 DIAGNOSIS — J449 Chronic obstructive pulmonary disease, unspecified: Secondary | ICD-10-CM | POA: Insufficient documentation

## 2017-01-17 DIAGNOSIS — Y999 Unspecified external cause status: Secondary | ICD-10-CM | POA: Insufficient documentation

## 2017-01-17 DIAGNOSIS — E119 Type 2 diabetes mellitus without complications: Secondary | ICD-10-CM | POA: Insufficient documentation

## 2017-01-17 DIAGNOSIS — G8929 Other chronic pain: Secondary | ICD-10-CM | POA: Insufficient documentation

## 2017-01-17 DIAGNOSIS — W2203XA Walked into furniture, initial encounter: Secondary | ICD-10-CM | POA: Insufficient documentation

## 2017-01-17 DIAGNOSIS — S92524A Nondisplaced fracture of medial phalanx of right lesser toe(s), initial encounter for closed fracture: Secondary | ICD-10-CM | POA: Insufficient documentation

## 2017-01-17 DIAGNOSIS — Z794 Long term (current) use of insulin: Secondary | ICD-10-CM | POA: Insufficient documentation

## 2017-01-17 DIAGNOSIS — Z791 Long term (current) use of non-steroidal anti-inflammatories (NSAID): Secondary | ICD-10-CM | POA: Insufficient documentation

## 2017-01-17 DIAGNOSIS — R52 Pain, unspecified: Secondary | ICD-10-CM

## 2017-01-17 MED ORDER — HYDROCODONE-ACETAMINOPHEN 5-325 MG PO TABS: 1.0000 | ORAL_TABLET | Freq: Four times a day (QID) | ORAL | 0 refills | Status: DC | PRN

## 2017-01-17 NOTE — ED Notes (Signed)
Pt verbalized understanding of no driving and to use caution within 4 hours of taking pain meds due to meds cause drowsiness 

## 2017-01-17 NOTE — ED Triage Notes (Signed)
Pt c/o pain to right foot area after hitting it against the table twice yesterday,

## 2017-01-17 NOTE — ED Notes (Signed)
Patient transported to X-ray 

## 2017-01-17 NOTE — Discharge Instructions (Signed)
Change the buddy tape daily or as needed so it stays clean.  Wear the post op shoe at all times when awake and walking to protect your injury.  You may take the hydrocodone prescribed for pain relief.  This will make you drowsy - do not drive within 4 hours of taking this medication.

## 2017-01-19 NOTE — ED Provider Notes (Signed)
[facility] DEPT Provider Note   CSN: [id number] Arrival date & time: 01/17/17  2054     History   Chief Complaint Chief Complaint  Patient presents with  . Foot Pain    HPI Rhonda Hickman is a 61 y.o. female.  The history is provided by the patient.  Foot Pain  This is a new problem. The current episode started yesterday (Pt stubbed her right foot and lateral toes into her table twice yesterday.). The problem occurs constantly. The problem has not changed since onset.The symptoms are aggravated by walking and bending. Nothing relieves the symptoms. She has tried acetaminophen for the symptoms. The treatment provided no relief.    Past Medical History:  Diagnosis Date  . Anxiety   . Asthma   . Bipolar 1 disorder ([place])   . Bulging lumbar disc   . Chronic pain of left knee   . COPD (chronic obstructive pulmonary disease) ([place])   . Diabetes mellitus     Patient Active Problem List   Diagnosis Date Noted  . Derangement of posterior horn of medial meniscus of right knee   . Meniscus, lateral, derangement, right   . Arthritis of knee, right   . COPD (chronic obstructive pulmonary disease) ([place]) 03/09/2016  . DIABETES MELLITUS, TYPE II 02/16/2007  . ABUSE, COCAINE, UNSPECIFIED 02/16/2007  . ASTHMA, EXTRINSIC NOS 02/16/2007  . HOMELESSNESS, HX OF 02/16/2007    Past Surgical History:  Procedure Laterality Date  . ABDOMINAL HYSTERECTOMY    . CARPAL TUNNEL RELEASE Right   . KNEE ARTHROSCOPY WITH MEDIAL MENISECTOMY Right 04/14/2016   Procedure: KNEE ARTHROSCOPY WITH MEDIAL AND LATERAL MENISECTOMY, MICROFRACTURE REPAIR;  Surgeon: Carole Civil, MD;  Location: [facility];  Service: Orthopedics;  Laterality: Right;  lateral menisectomy - needs crutch training    OB History    No data available       Home Medications    Prior to Admission medications   Medication Sig Start Date End Date Taking? Authorizing Provider  albuterol (PROVENTIL HFA;VENTOLIN HFA) 108 (90  BASE) MCG/ACT inhaler Inhale 2 puffs into the lungs every 6 (six) hours as needed for wheezing.    [provider]  albuterol (PROVENTIL) (2.5 MG/3ML) 0.083% nebulizer solution Take 2.5 mg by nebulization every 6 (six) hours as needed for shortness of breath.     [provider]  amLODipine (NORVASC) 5 MG tablet Take 5 mg by mouth daily.    [provider]  azithromycin (ZITHROMAX) 250 MG tablet 2 tabs day one, then one daily until all taken. Patient not taking: Reported on 08/27/2016 08/21/16   Lily Kocher, PA-C  benzonatate (TESSALON) 100 MG capsule Take 1 capsule (100 mg total) by mouth every 8 (eight) hours. 08/28/16   Rancour, Annie Main, MD  Dextromethorphan-Guaifenesin (CHILDRENS COUGH PO) Take 10-15 mLs by mouth once as needed (for cough).    [provider]  diclofenac (VOLTAREN) 75 MG EC tablet Take 1 tablet (75 mg total) by mouth 2 (two) times daily with a meal. 06/06/16   Carole Civil, MD  divalproex (DEPAKOTE ER) 500 MG 24 hr tablet Take 1,000-1,500 mg by mouth 2 (two) times daily. 2 tablets in the morning and 3 tablets at bedtime.    [provider]  gabapentin (NEURONTIN) 300 MG capsule Take 600 mg by mouth 3 (three) times daily as needed (pain).     [provider]  HYDROcodone-acetaminophen (NORCO/VICODIN) 5-325 MG tablet Take 1 tablet by mouth every 6 (six) hours as needed  for severe pain. 01/17/17   Evalee Jefferson, PA-C  HYDROcodone-homatropine (HYCODAN) 5-1.5 MG/5ML syrup Take 5 mLs by mouth every 6 (six) hours as needed. Patient not taking: Reported on 08/27/2016 08/21/16   Lily Kocher, PA-C  insulin NPH-regular (NOVOLIN 70/30) (70-30) 100 UNIT/ML injection Inject 50 Units into the skin 3 (three) times daily.     [provider]  nystatin (MYCOSTATIN) 100000 UNIT/ML suspension Take 5 mLs (500,000 Units total) by mouth 4 (four) times daily. 08/28/16   Rancour, Annie Main, MD  oxybutynin (DITROPAN) 5 MG tablet Take 5 mg by  mouth 2 (two) times daily.    [provider]  predniSONE (DELTASONE) 50 MG tablet 1 tablet PO daily 08/28/16   Rancour, Annie Main, MD  risperiDONE (RISPERDAL) 0.25 MG tablet Take 0.25 mg by mouth at bedtime.    [provider]  traZODone (DESYREL) 100 MG tablet Take 200 mg by mouth at bedtime as needed for sleep.     [provider]    Family History No family history on file.  Social History Social History  Substance Use Topics  . Smoking status: Current Some Day Smoker    Packs/day: 0.50    Types: Cigarettes  . Smokeless tobacco: Never Used  . Alcohol use No     Allergies   Aspirin and Ibuprofen   Review of Systems Review of Systems  Constitutional: Negative for fever.  Musculoskeletal: Positive for arthralgias and joint swelling. Negative for myalgias.  Neurological: Negative for weakness and numbness.     Physical Exam Updated Vital Signs BP (!) 145/67 (BP Location: Right Arm)   Pulse 74   Temp 98.9 F (37.2 C) (Oral)   Resp 20   Ht 6' (1.829 m)   Wt 124.7 kg (275 lb)   SpO2 94%   BMI 37.30 kg/m   Physical Exam  Constitutional: She appears well-developed and well-nourished.  HENT:  Head: Atraumatic.  Neck: Normal range of motion.  Cardiovascular:  Pulses equal bilaterally  Musculoskeletal: She exhibits edema and tenderness.       Right foot: There is bony tenderness and swelling. There is normal capillary refill and no deformity.       Feet:  ttp right 3rd through 5th toes with moderate edema to mid foot.  Distal sensation intact with less than 2 sec cap refill in toes.  Dorsalis pedal pulse intact. Ankle nontender. Skin intact. No nail injury.  Neurological: She is alert. She has normal strength. She displays normal reflexes. No sensory deficit.  Skin: Skin is warm and dry.  Psychiatric: She has a normal mood and affect.     ED Treatments / Results  Labs (all labs ordered are listed, but only abnormal results are  displayed) Labs Reviewed - No data to display  EKG  EKG Interpretation None       Radiology Dg Foot Complete Right  Result Date: 01/17/2017 CLINICAL DATA:  Hit foot on table now with pain and swelling EXAM: RIGHT FOOT COMPLETE - 3+ VIEW COMPARISON:  None. FINDINGS: Linear lucency in the distal shaft of the fourth proximal phalanx consistent with nondisplaced fracture. Possible small cortical fracture lateral aspect of the head of the fifth proximal phalanx. No subluxation. No radiopaque foreign body. IMPRESSION: 1. Nondisplaced fracture involving the distal shaft of the fourth proximal phalanx 2. Possible cortical fracture involving the head of the fifth proximal phalanx Electronically Signed   By: Donavan Foil M.D.   On: 01/17/2017 21:27    Procedures Procedures (including critical  care time)  Medications Ordered in ED Medications - No data to display   Initial Impression / Assessment and Plan / ED Course  I have reviewed the triage vital signs and the nursing notes.  Pertinent labs & imaging results that were available during my care of the patient were reviewed by me and considered in my medical decision making (see chart for details).     Imaging reviewed and discussed, ice, elevation, buddy taped toes, post op shoe. Referral to Dr. Aline Brochure for f/u care (is a pt of Dr. Aline Brochure).   Final Clinical Impressions(s) / ED Diagnoses   Final diagnoses:  Pain  Closed nondisplaced fracture of middle phalanx of lesser toe of right foot, initial encounter    New Prescriptions Discharge Medication List as of 01/17/2017 10:14 PM       Landis Martins 01/19/17 1116    Nat Christen, MD 01/21/17 [phone]

## 2017-01-23 ENCOUNTER — Emergency Department (HOSPITAL_COMMUNITY): Payer: Self-pay

## 2017-01-23 ENCOUNTER — Emergency Department (HOSPITAL_COMMUNITY)
Admission: EM | Admit: 2017-01-23 | Discharge: 2017-01-23 | Disposition: A | Discharge status: Home / Self Care | Payer: Self-pay | Attending: Emergency Medicine | Admitting: Emergency Medicine

## 2017-01-23 ENCOUNTER — Encounter (HOSPITAL_COMMUNITY): Payer: Self-pay

## 2017-01-23 DIAGNOSIS — J45909 Unspecified asthma, uncomplicated: Secondary | ICD-10-CM | POA: Insufficient documentation

## 2017-01-23 DIAGNOSIS — J449 Chronic obstructive pulmonary disease, unspecified: Secondary | ICD-10-CM | POA: Insufficient documentation

## 2017-01-23 DIAGNOSIS — F17210 Nicotine dependence, cigarettes, uncomplicated: Secondary | ICD-10-CM | POA: Insufficient documentation

## 2017-01-23 DIAGNOSIS — R609 Edema, unspecified: Secondary | ICD-10-CM | POA: Insufficient documentation

## 2017-01-23 DIAGNOSIS — Z79899 Other long term (current) drug therapy: Secondary | ICD-10-CM | POA: Insufficient documentation

## 2017-01-23 DIAGNOSIS — E119 Type 2 diabetes mellitus without complications: Secondary | ICD-10-CM | POA: Insufficient documentation

## 2017-01-23 LAB — BASIC METABOLIC PANEL
ANION GAP: 7 (ref 5–15)
BUN: 12 mg/dL (ref 6–20)
CHLORIDE: 105 mmol/L (ref 101–111)
CO2: 27 mmol/L (ref 22–32)
CREATININE: 1.01 mg/dL — AB (ref 0.44–1.00)
Calcium: 9 mg/dL (ref 8.9–10.3)
GFR calc non Af Amer: 59 mL/min — ABNORMAL LOW (ref >60)
Glucose, Bld: 114 mg/dL — ABNORMAL HIGH (ref 65–99)
POTASSIUM: 4.2 mmol/L (ref 3.5–5.1)
SODIUM: 139 mmol/L (ref 135–145)

## 2017-01-23 LAB — CBC
HCT: 39.9 % (ref 36.0–46.0)
HEMOGLOBIN: 13.2 g/dL (ref 12.0–15.0)
MCH: 28.8 pg (ref 26.0–34.0)
MCHC: 33.1 g/dL (ref 30.0–36.0)
MCV: 86.9 fL (ref 78.0–100.0)
PLATELETS: 204 10*3/uL (ref 150–400)
RBC: 4.59 MIL/uL (ref 3.87–5.11)
RDW: 14.9 % (ref 11.5–15.5)
WBC: 5.8 10*3/uL (ref 4.0–10.5)

## 2017-01-23 LAB — TROPONIN I: Troponin I: <0.03 ng/mL (ref <0.03)

## 2017-01-23 NOTE — ED Triage Notes (Signed)
Reports of shortness of breath when laying down, bilateral leg swelling and abscess. NAD noted in triage.

## 2017-01-23 NOTE — Discharge Instructions (Signed)
There were no serious causes found associated with the swelling in your legs and trouble breathing.  Things that can improve swelling are elevation of your legs above your heart several times during each day.  Make sure that you are avoiding eating extra salt, which can cause fluid accumulation.  Try to eat a healthy diet.  Also, consider getting some compression hose to help the leg swelling.  You should try to stop smoking.  Return here, or see your doctor for problems.

## 2017-01-23 NOTE — ED Provider Notes (Signed)
[facility] DEPT Provider Note   CSN: [id number] Arrival date & time: 01/23/17  1500     History   Chief Complaint Chief Complaint  Patient presents with  . Shortness of Breath  . Leg Swelling    HPI Rhonda Hickman is a 61 y.o. female.  She is here for primary complaint of swelling in legs, present for several days.  She was recently in the ED and diagnosed with right toe fracture.  Shortness of breath is present, worse with supine position.  No ongoing chest pain, fever, chills, nausea, vomiting, weakness or dizziness.  There are no other known modifying factors.   HPI  Past Medical History:  Diagnosis Date  . Anxiety   . Asthma   . Bipolar 1 disorder ([place])   . Bulging lumbar disc   . Chronic pain of left knee   . COPD (chronic obstructive pulmonary disease) ([place])   . Diabetes mellitus     Patient Active Problem List   Diagnosis Date Noted  . Derangement of posterior horn of medial meniscus of right knee   . Meniscus, lateral, derangement, right   . Arthritis of knee, right   . COPD (chronic obstructive pulmonary disease) ([place]) 03/09/2016  . DIABETES MELLITUS, TYPE II 02/16/2007  . ABUSE, COCAINE, UNSPECIFIED 02/16/2007  . ASTHMA, EXTRINSIC NOS 02/16/2007  . HOMELESSNESS, HX OF 02/16/2007    Past Surgical History:  Procedure Laterality Date  . ABDOMINAL HYSTERECTOMY    . CARPAL TUNNEL RELEASE Right   . KNEE ARTHROSCOPY WITH MEDIAL MENISECTOMY Right 04/14/2016   Procedure: KNEE ARTHROSCOPY WITH MEDIAL AND LATERAL MENISECTOMY, MICROFRACTURE REPAIR;  Surgeon: Carole Civil, MD;  Location: [facility];  Service: Orthopedics;  Laterality: Right;  lateral menisectomy - needs crutch training    OB History    No data available       Home Medications    Prior to Admission medications   Medication Sig Start Date End Date Taking? Authorizing Provider  albuterol (PROVENTIL HFA;VENTOLIN HFA) 108 (90 BASE) MCG/ACT inhaler Inhale 2 puffs into the lungs every 6  (six) hours as needed for wheezing.   Yes [provider]  albuterol (PROVENTIL) (2.5 MG/3ML) 0.083% nebulizer solution Take 2.5 mg by nebulization every 6 (six) hours as needed for shortness of breath.    Yes [provider]  amLODipine (NORVASC) 5 MG tablet Take 5 mg by mouth daily.   Yes [provider]  citalopram (CELEXA) 20 MG tablet Take 20 mg by mouth daily.   Yes [provider]  diclofenac (VOLTAREN) 75 MG EC tablet Take 1 tablet (75 mg total) by mouth 2 (two) times daily with a meal. 06/06/16  Yes Carole Civil, MD  divalproex (DEPAKOTE ER) 500 MG 24 hr tablet Take 1,000-1,500 mg by mouth 2 (two) times daily. 2 tablets in the morning and 3 tablets at bedtime.   Yes [provider]  gabapentin (NEURONTIN) 300 MG capsule Take 600 mg by mouth 3 (three) times daily.    Yes [provider]  HYDROcodone-acetaminophen (NORCO/VICODIN) 5-325 MG tablet Take 1 tablet by mouth every 6 (six) hours as needed for severe pain. 01/17/17  Yes Idol, Almyra Free, PA-C  insulin NPH-regular (NOVOLIN 70/30) (70-30) 100 UNIT/ML injection Inject 50 Units into the skin 3 (three) times daily.    Yes [provider]  oxybutynin (DITROPAN) 5 MG tablet Take 5 mg by mouth 2 (two) times daily.   Yes [provider]  risperiDONE (RISPERDAL) 0.25 MG tablet Take  0.25 mg by mouth at bedtime.   Yes [provider]  traZODone (DESYREL) 100 MG tablet Take 200 mg by mouth at bedtime.    Yes [provider]    Family History No family history on file.  Social History Social History  Substance Use Topics  . Smoking status: Current Some Day Smoker    Packs/day: 0.50    Types: Cigarettes  . Smokeless tobacco: Never Used  . Alcohol use No     Allergies   Aspirin and Ibuprofen   Review of Systems Review of Systems   Physical Exam Updated Vital Signs BP 134/66   Pulse (!) 55   Temp 97.9 F (36.6 C) (Oral)   Resp 20   Ht  6' (1.829 m)   Wt 124.7 kg (275 lb)   SpO2 99%   BMI 37.30 kg/m   Physical Exam  Constitutional: She is oriented to person, place, and time. She appears well-developed.  Obese  HENT:  Head: Normocephalic and atraumatic.  Eyes: Pupils are equal, round, and reactive to light. Conjunctivae and EOM are normal.  Neck: Normal range of motion and phonation normal. Neck supple.  Cardiovascular: Normal rate and regular rhythm.   Pulmonary/Chest: Effort normal and breath sounds normal. She exhibits no tenderness.  Abdominal: Soft. She exhibits no distension. There is no tenderness. There is no guarding.  Musculoskeletal: Normal range of motion.  1-2+ peripheral edema, symmetric.  Lower legs nontender to palpation.  Neurological: She is alert and oriented to person, place, and time. She exhibits normal muscle tone.  Skin: Skin is warm and dry.  Psychiatric: She has a normal mood and affect. Her behavior is normal. Judgment and thought content normal.  Nursing note and vitals reviewed.    ED Treatments / Results  Labs (all labs ordered are listed, but only abnormal results are displayed) Labs Reviewed  BASIC METABOLIC PANEL - Abnormal; Notable for the following:       Result Value   Glucose, Bld 114 (*)    Creatinine, Ser 1.01 (*)    GFR calc non Af Amer 59 (*)    All other components within normal limits  CBC  TROPONIN I    EKG  EKG Interpretation  Date/Time:  Monday January 23 2017 15:11:52 EDT Ventricular Rate:  70 PR Interval:  162 QRS Duration: 70 QT Interval:  374 QTC Calculation: 403 R Axis:   -9 Text Interpretation:  Normal sinus rhythm Possible Left atrial enlargement Nonspecific T wave abnormality Abnormal ECG since last tracing no significant change Confirmed by Daleen Bo [phone]) on 01/23/2017 8:48:02 PM       Radiology Dg Chest 2 View  Result Date: 01/23/2017 CLINICAL DATA:  Abnormal breath sounds. Shortness of breath. History of COPD . EXAM: CHEST  2 VIEW  COMPARISON:  08/27/2016 . FINDINGS: Mediastinum hilar structures normal. Cardiomegaly with mild pulmonary vascular prominence. Low lung volumes with mild bibasilar subsegmental left scratched it low lung volumes with mild left base subsegmental atelectasis. No pleural effusion or pneumothorax. IMPRESSION: 1. Stable cardiomegaly.  Mild pulmonary vascular prominence . 2. Low lung volumes with mild left base subsegmental atelectasis. Electronically Signed   By: Marcello Moores  Register   On: 01/23/2017 15:58    Procedures Procedures (including critical care time)  Medications Ordered in ED Medications - No data to display   Initial Impression / Assessment and Plan / ED Course  I have reviewed the triage vital signs and the nursing notes.  Pertinent labs & imaging  results that were available during my care of the patient were reviewed by me and considered in my medical decision making (see chart for details).      Patient Vitals for the past 24 hrs:  BP Temp Temp src Pulse Resp SpO2 Height Weight  01/23/17 2050 (!) 144/80 - - (!) 56 16 98 % - -  01/23/17 1950 - - - - - 99 % - -  01/23/17 1930 134/66 - - (!) 55 20 97 % - -  01/23/17 1919 97/63 - - 60 19 100 % - -  01/23/17 1611 125/60 - - 60 18 97 % - -  01/23/17 1508 102/74 97.9 F (36.6 C) Oral 73 18 100 % 6' (1.829 m) 124.7 kg (275 lb)    At d/c Reevaluation with update and discussion. After initial assessment and treatment, an updated evaluation reveals she remains comfortable, findings discussed. Kimiyo Carmicheal L    Final Clinical Impressions(s) / ED Diagnoses   Final diagnoses:  Peripheral edema   Nonspecific lower extremity edema, with nonspecific shortness of breath.  Doubt pneumonia, PE, congestive heart failure, serious bacterial infection or metabolic instability.  Nursing Notes Reviewed/ Care Coordinated Applicable Imaging Reviewed Interpretation of Laboratory Data incorporated into ED treatment  The patient appears  reasonably screened and/or stabilized for discharge and I doubt any other medical condition or other [facility] requiring further screening, evaluation, or treatment in the ED at this time prior to discharge.  Plan: Home Medications-continue usual medications; Home Treatments-elevate legs as much as possible, low-salt diet; return here if the recommended treatment, does not improve the symptoms; Recommended follow up-PCP checkup in 1 week.   New Prescriptions New Prescriptions   No medications on file     Daleen Bo, MD 01/23/17 2349

## 2017-01-23 NOTE — ED Notes (Signed)
Pt talking on cell phone. Nad. States no changes.

## 2017-04-27 ENCOUNTER — Other Ambulatory Visit: Payer: Self-pay | Admitting: Orthopedic Surgery

## 2017-04-27 DIAGNOSIS — M1712 Unilateral primary osteoarthritis, left knee: Secondary | ICD-10-CM

## 2017-06-14 ENCOUNTER — Ambulatory Visit: Payer: Self-pay | Admitting: Orthopedic Surgery

## 2017-06-28 ENCOUNTER — Encounter: Payer: Self-pay | Admitting: Orthopedic Surgery

## 2017-06-28 ENCOUNTER — Ambulatory Visit (INDEPENDENT_AMBULATORY_CARE_PROVIDER_SITE_OTHER): Payer: Self-pay | Admitting: Orthopedic Surgery

## 2017-06-28 ENCOUNTER — Ambulatory Visit (INDEPENDENT_AMBULATORY_CARE_PROVIDER_SITE_OTHER): Payer: Self-pay

## 2017-06-28 ENCOUNTER — Telehealth: Payer: Self-pay | Admitting: Orthopedic Surgery

## 2017-06-28 VITALS — BP 137/83 | HR 86 | Ht 72.0 in | Wt 288.0 lb

## 2017-06-28 DIAGNOSIS — M1712 Unilateral primary osteoarthritis, left knee: Secondary | ICD-10-CM

## 2017-06-28 DIAGNOSIS — G8929 Other chronic pain: Secondary | ICD-10-CM

## 2017-06-28 DIAGNOSIS — M25562 Pain in left knee: Secondary | ICD-10-CM

## 2017-06-28 DIAGNOSIS — R209 Unspecified disturbances of skin sensation: Secondary | ICD-10-CM

## 2017-06-28 MED ORDER — DICLOFENAC SODIUM 75 MG PO TBEC: DELAYED_RELEASE_TABLET | ORAL | 2 refills | Status: DC

## 2017-06-28 NOTE — Addendum Note (Signed)
Addended byCandice Camp on: 06/28/2017 10:54 AM   Modules accepted: Orders

## 2017-06-28 NOTE — Telephone Encounter (Signed)
Called to advise use brace when up, especially with ambulation, does not need at night or when at rest. Left message for her to advise, call back if she has additional questions.

## 2017-06-28 NOTE — Addendum Note (Signed)
Addended byCandice Camp on: 06/28/2017 04:44 PM   Modules accepted: Orders

## 2017-06-28 NOTE — Addendum Note (Signed)
Addended byCandice Camp on: 06/28/2017 10:28 AM   Modules accepted: Orders

## 2017-06-28 NOTE — Addendum Note (Signed)
Addended by: Arther Abbott E on: 06/28/2017 11:48 AM   Modules accepted: Orders

## 2017-06-28 NOTE — Progress Notes (Signed)
Chief Complaint  Patient presents with  . Knee Pain    left    61 year old female status post right knee arthroscopy 2017 presents with chronic left knee pain initially evaluated about 5 years ago but worsening over the last 2-3 months.  She is already on hydrocodone and diclofenac as well as gabapentin but is not getting relief.  She complains of global knee pain which occasionally focuses over the lateral compartment is associated with swelling and flexion and inability walking and some episodes of giving way left knee   Review of Systems - Negative except running water sensation right leg, chronic lower back pain, numbness and tingling and pain cervical spine, no weight loss or shortness of breath or chest pain     Social History   Tobacco Use  . Smoking status: Current Some Day Smoker    Packs/day: 0.50    Types: Cigarettes  . Smokeless tobacco: Never Used  Substance Use Topics  . Alcohol use: No  . Drug use: No   Past Surgical History:  Procedure Laterality Date  . ABDOMINAL HYSTERECTOMY    . CARPAL TUNNEL RELEASE Right   . KNEE ARTHROSCOPY WITH MEDIAL MENISECTOMY Right 04/14/2016   Procedure: KNEE ARTHROSCOPY WITH MEDIAL AND LATERAL MENISECTOMY, MICROFRACTURE REPAIR;  Surgeon: Carole Civil, MD;  Location: [facility];  Service: Orthopedics;  Laterality: Right;  lateral menisectomy - needs crutch training    Past Medical History:  Diagnosis Date  . Anxiety   . Asthma   . Bipolar 1 disorder ([place])   . Bulging lumbar disc   . Chronic pain of left knee   . COPD (chronic obstructive pulmonary disease) ([place])   . Diabetes mellitus     Current Outpatient Medications:  .  albuterol (PROVENTIL HFA;VENTOLIN HFA) 108 (90 BASE) MCG/ACT inhaler, Inhale 2 puffs into the lungs every 6 (six) hours as needed for wheezing., Disp: , Rfl:  .  albuterol (PROVENTIL) (2.5 MG/3ML) 0.083% nebulizer solution, Take 2.5 mg by nebulization every 6 (six) hours as needed for shortness of breath.  , Disp: , Rfl:  .  amLODipine (NORVASC) 5 MG tablet, Take 5 mg by mouth daily., Disp: , Rfl:  .  citalopram (CELEXA) 20 MG tablet, Take 20 mg by mouth daily., Disp: , Rfl:  .  divalproex (DEPAKOTE ER) 500 MG 24 hr tablet, Take 1,000-1,500 mg by mouth 2 (two) times daily. 2 tablets in the morning and 3 tablets at bedtime., Disp: , Rfl:  .  gabapentin (NEURONTIN) 300 MG capsule, Take 600 mg by mouth 3 (three) times daily. , Disp: , Rfl:  .  insulin NPH-regular (NOVOLIN 70/30) (70-30) 100 UNIT/ML injection, Inject 50 Units into the skin 3 (three) times daily. , Disp: , Rfl:  .  oxybutynin (DITROPAN) 5 MG tablet, Take 5 mg by mouth 2 (two) times daily., Disp: , Rfl:  .  diclofenac (VOLTAREN) 75 MG EC tablet, TAKE  (1)  TABLET TWICE A DAY WITH MEALS (BREAKFAST AND SUPPER) (Patient not taking: Reported on 06/28/2017), Disp: 30 tablet, Rfl: 0 .  HYDROcodone-acetaminophen (NORCO/VICODIN) 5-325 MG tablet, Take 1 tablet by mouth every 6 (six) hours as needed for severe pain. (Patient not taking: Reported on 06/28/2017), Disp: 20 tablet, Rfl: 0 .  risperiDONE (RISPERDAL) 0.25 MG tablet, Take 0.25 mg by mouth at bedtime., Disp: , Rfl:  .  traZODone (DESYREL) 100 MG tablet, Take 200 mg by mouth at bedtime. , Disp: , Rfl:   BP 137/83   Pulse 86  Ht 6' (1.829 m)   Wt 288 lb (130.6 kg)   BMI 39.06 kg/m   Physical Exam  Constitutional: She is oriented to person, place, and time. She appears well-developed and well-nourished.  Musculoskeletal:       Legs: Neurological: She is alert and oriented to person, place, and time. Gait abnormal.  She has limping and decreased stance phase gait on the left side  Psychiatric: She has a normal mood and affect. Judgment normal.  Vitals reviewed.  Encounter Diagnoses  Name Primary?  . Chronic pain of left knee Yes  . Primary osteoarthritis of left knee     X-ray shows severe arthritis left knee  Recommend MRI left knee   Brace left knee, physical therapy

## 2017-06-28 NOTE — Telephone Encounter (Signed)
Rhonda Hickman was given a knee brace today.  She has questions such as how long does she need to wear it and does she need to sleep in it.  Please call her

## 2017-07-07 ENCOUNTER — Ambulatory Visit (HOSPITAL_COMMUNITY)
Admission: RE | Admit: 2017-07-07 | Discharge: 2017-07-07 | Disposition: A | Discharge status: Home / Self Care | Payer: Self-pay | Source: Ambulatory Visit | Attending: Orthopedic Surgery | Admitting: Orthopedic Surgery

## 2017-07-07 DIAGNOSIS — R937 Abnormal findings on diagnostic imaging of other parts of musculoskeletal system: Secondary | ICD-10-CM | POA: Insufficient documentation

## 2017-07-07 DIAGNOSIS — M25562 Pain in left knee: Secondary | ICD-10-CM | POA: Insufficient documentation

## 2017-07-07 DIAGNOSIS — G8929 Other chronic pain: Secondary | ICD-10-CM | POA: Insufficient documentation

## 2017-07-07 DIAGNOSIS — M1712 Unilateral primary osteoarthritis, left knee: Secondary | ICD-10-CM | POA: Insufficient documentation

## 2017-07-07 DIAGNOSIS — M25462 Effusion, left knee: Secondary | ICD-10-CM | POA: Insufficient documentation

## 2017-07-14 ENCOUNTER — Telehealth: Payer: Self-pay | Admitting: Orthopedic Surgery

## 2017-07-14 ENCOUNTER — Other Ambulatory Visit: Payer: Self-pay | Admitting: Orthopedic Surgery

## 2017-07-14 DIAGNOSIS — M25561 Pain in right knee: Principal | ICD-10-CM

## 2017-07-14 DIAGNOSIS — G8929 Other chronic pain: Secondary | ICD-10-CM

## 2017-07-14 MED ORDER — TRAMADOL HCL 50 MG PO TABS: 50.0000 mg | ORAL_TABLET | Freq: Four times a day (QID) | ORAL | 5 refills | Status: DC | PRN

## 2017-07-14 NOTE — Telephone Encounter (Signed)
Patient is requesting something for pain.   You prescribed Hydrocodone-Acetaminophen in the past  She uses [facility] in [place]

## 2017-07-14 NOTE — Progress Notes (Signed)
180  Sedative  080  Stimulant  000  Explanation and Guidance  OVERDOSE RISK SCORE  230

## 2017-07-14 NOTE — Telephone Encounter (Signed)
Tramado sent to pharmacy   Reminder: all rx requests must be in by thurs noon

## 2017-07-17 ENCOUNTER — Other Ambulatory Visit: Payer: Self-pay | Admitting: Orthopedic Surgery

## 2017-07-17 ENCOUNTER — Telehealth: Payer: Self-pay | Admitting: Orthopedic Surgery

## 2017-07-17 DIAGNOSIS — M25561 Pain in right knee: Principal | ICD-10-CM

## 2017-07-17 DIAGNOSIS — G8929 Other chronic pain: Secondary | ICD-10-CM

## 2017-07-17 MED ORDER — TRAMADOL HCL 50 MG PO TABS: 50.0000 mg | ORAL_TABLET | Freq: Four times a day (QID) | ORAL | 5 refills | Status: DC | PRN

## 2017-07-17 NOTE — Telephone Encounter (Signed)
Yes, done.  

## 2017-07-17 NOTE — Telephone Encounter (Signed)
Patient called this morning asking if you could cancel the prescription you sent to [facility] in [place] and resend it to [facility]. She has changed pharmacies  Tramadol 50 mg  Qty 60 Tablets  Take 1 tablet (50 mg total) by mouth every 6 (six) hours as needed.

## 2017-07-21 ENCOUNTER — Ambulatory Visit (INDEPENDENT_AMBULATORY_CARE_PROVIDER_SITE_OTHER): Payer: Self-pay | Admitting: Orthopedic Surgery

## 2017-07-21 VITALS — BP 118/66 | HR 68 | Ht 72.0 in | Wt 288.0 lb

## 2017-07-21 DIAGNOSIS — M1712 Unilateral primary osteoarthritis, left knee: Secondary | ICD-10-CM

## 2017-07-21 DIAGNOSIS — M25562 Pain in left knee: Secondary | ICD-10-CM

## 2017-07-21 DIAGNOSIS — G8929 Other chronic pain: Secondary | ICD-10-CM

## 2017-07-21 NOTE — Progress Notes (Signed)
Patient ID: Rhonda Hickman, female   DOB: Aug 07, 1955, 62 y.o.   MRN: [id number]  MRI FOLLOW UP  Chief Complaint  Patient presents with  . Follow-up    MRI results of left knee    HPI Rhonda Hickman is a 62 y.o. female.    The patient has had MRI of the left knee 62 year old female who had a right knee arthroscopy in 2017 presented with chronic pain of her left knee previously evaluated about 5 years ago worse over the last 2-3 months with breakthrough pain despite being on hydrocodone and diclofenac and gabapentin.  Global knee pain was noted with localization over the lateral compartment with swelling and giving way left knee  She states her pain is worsening and medication not controlling but she does not want injection    Review of Systems  Neurological:       Unexplained neurogenic radicular type symptoms.    Physical Exam  Constitutional: She is oriented to person, place, and time. She appears well-developed and well-nourished.  Neurological: She is alert and oriented to person, place, and time.  Psychiatric: She has a normal mood and affect. Judgment normal.  Vitals reviewed.  Prior exam findings  Knee flexion was limited to 90 degrees skin scars were noted from her childhood injury collateral ligaments and cruciate ligaments were stable muscle tone was normal there was no effusion intact distal pulses no pathologic reflexes skin was normal  Data  Encounter Diagnoses  Name Primary?  . Chronic pain of left knee Yes  . Primary osteoarthritis of left knee    THE PROBLEM IS WORSE   My Independent image interpretation of the MRI showed was primarily arthritis in the patellofemoral joint no meniscal damage acute   The report was read as follows  IMPRESSION: 1. Slight progression arthritis of the patellofemoral compartment. 2. Interval partial healing of cartilage defects in the medial and lateral compartments. 3. Decreased now small joint effusion. 4. Chronic  intrinsic degeneration of the posterior horns of the medial and lateral menisci without discrete tears, unchanged.     Electronically Signed   By: Lorriane Shire M.D.   On: 07/07/2017 10:27   The patient will undergo physical therapy of the left knee.  We counseled her to stop smoking she says she has several life stressors that are causing her to continue to smoke.  [facility] so I will try to relate to them to give her a patch for the smoking cessation.   The plan is to treat conservatively.  Arther Abbott, MD 07/21/2017 9:32 AM

## 2017-08-01 ENCOUNTER — Ambulatory Visit: Payer: Self-pay | Admitting: Physical Therapy

## 2017-08-08 ENCOUNTER — Encounter: Payer: Self-pay | Admitting: Physical Therapy

## 2017-08-08 ENCOUNTER — Ambulatory Visit: Payer: Self-pay | Attending: Orthopedic Surgery | Admitting: Physical Therapy

## 2017-08-08 ENCOUNTER — Other Ambulatory Visit: Payer: Self-pay

## 2017-08-08 DIAGNOSIS — M6281 Muscle weakness (generalized): Secondary | ICD-10-CM | POA: Insufficient documentation

## 2017-08-08 DIAGNOSIS — G8929 Other chronic pain: Secondary | ICD-10-CM | POA: Insufficient documentation

## 2017-08-08 DIAGNOSIS — M25562 Pain in left knee: Secondary | ICD-10-CM | POA: Insufficient documentation

## 2017-08-08 NOTE — Therapy (Signed)
[facility] [place], [place], [address] Phone: [phone]   Fax:  [phone]  Physical Therapy Evaluation  Patient Details  Name: Rhonda Hickman MRN: [id number] Date of Birth: 06-11-1956 No Data Recorded  Encounter Date: 08/08/2017  PT End of Session - 08/08/17 1627    Visit Number  1    Number of Visits  8    Date for PT Re-Evaluation  09/05/17    PT Start Time  0230    PT Stop Time  0312    PT Time Calculation (min)  42 min    Activity Tolerance  Patient tolerated treatment well    Behavior During Therapy  [facility] for tasks assessed/performed;Restless       Past Medical History:  Diagnosis Date  . Anxiety   . Asthma   . Bipolar 1 disorder ([place])   . Bulging lumbar disc   . Chronic pain of left knee   . COPD (chronic obstructive pulmonary disease) ([place])   . Diabetes mellitus     Past Surgical History:  Procedure Laterality Date  . ABDOMINAL HYSTERECTOMY    . CARPAL TUNNEL RELEASE Right   . KNEE ARTHROSCOPY WITH MEDIAL MENISECTOMY Right 04/14/2016   Procedure: KNEE ARTHROSCOPY WITH MEDIAL AND LATERAL MENISECTOMY, MICROFRACTURE REPAIR;  Surgeon: Carole Civil, MD;  Location: [facility];  Service: Orthopedics;  Laterality: Right;  lateral menisectomy - needs crutch training    There were no vitals filed for this visit.   Subjective Assessment - 08/08/17 1633    Subjective  The patient reports ongoing left knee pain since 2014.  The patient rates her pain at 8/10 and says her pain commonly rises to 10+/10 with increased weight bearing.  The brace she has donned today helps decrease her pain somewhat.    Pertinent History  asthma, anxiety, COPD, DM, bipolar disorder, arthritis, right knee arthroscopic surgery.    How long can you stand comfortably?  10 to 15 minutes     Patient Stated Goals  Have surgery if physical therapy doesn't help    Pain Score  8     Pain Location  Knee    Pain Orientation  Left    Pain  Descriptors / Indicators  Aching;Stabbing    Pain Type  Chronic pain    Pain Onset  More than a month ago    Pain Frequency  Constant    Aggravating Factors   Increased weight bearing.    Pain Relieving Factors  Knee brace.         [facility] PT Assessment - 08/08/17 0001      Assessment   Medical Diagnosis  Primary OA of left knee.    Onset Date/Surgical Date  -- 2014.      Precautions   Precautions  -- Pain-free left quadriceps strengthening.    Required Braces or Orthoses  -- Left knee brace with patellar orifice.      Restrictions   Weight Bearing Restrictions  No      Balance Screen   Has the patient fallen in the past 6 months  Yes    How many times?  -- 1.    Has the patient had a decrease in activity level because of a fear of falling?   Yes    Is the patient reluctant to leave their home because of a fear of falling?   Yes      [place] residence  Prior Function   Level of Independence  Independent      Cognition   Overall Cognitive Status  Within Functional Limits for tasks assessed      Posture/Postural Control   Posture Comments  Left knee genu valgum.      AROM   Overall AROM Comments  Full active left knee flexion and extension.      Strength   Left Hip Flexion  4/5    Left Hip Extension  --    Left Hip ABduction  4/5    Left Knee Extension  4+/5      Palpation   Palpation comment  Very tender over left knee medial joint line especially the MCL.      Special Tests    Special Tests  Knee Special Tests    Other special tests  Positive left valgus test to left knee.  Anterior drawer negative.  Normal patellar mobility.    Knee Special tests   Patellofemoral Grind Test (Clarke's Sign)      Patellofemoral Grind test (Clark's Sign)   Findings  Postive (Mild).      Ambulation/Gait   Gait Pattern  Antalgic    Gait Comments  Patient in obvious pain when weight bearing over her left LE.              Objective measurements completed on examination: See above findings.      [facility] Adult PT Treatment/Exercise - 08/08/17 0001      Modalities   Modalities  Electrical Stimulation;Moist Heat      Moist Heat Therapy   Number Minutes Moist Heat  14 Minutes    Moist Heat Location  -- Left knee.      Electrical Stimulation   Electrical Stimulation Location  Left medial knee.    Electrical Stimulation Action  Constant pre-mod.    Electrical Stimulation Parameters  80-150 Hz x 14 minutes.    Electrical Stimulation Goals  Pain               PT Short Term Goals - 04/22/16 1629      PT SHORT TERM GOAL #1   Title  Pt will demo consistency and independence with her HEP to improve knee ROM and strength     Time  2    Period  Weeks    Status  New      PT SHORT TERM GOAL #2   Title  Pt will demo improved technique with sit to stand, evident by equal weight shift, without cues from therapist x10 reps.     Time  2    Period  Weeks    Status  New      PT SHORT TERM GOAL #3   Title  Pt will demo improved gait mechanics evident by symmetrical step length and heel toe sequencing x183ft without cues from therapist, using LRAD     Time  4    Period  Weeks    Status  New        PT Long Term Goals - 08/08/17 1708      PT LONG TERM GOAL #1   Title  Pt will demo improved BLE strength to 5/5 MMT to improve her safety with functional tasks.    Time  4    Period  Weeks    Status  New      PT LONG TERM GOAL #2   Title  Independent with a HEP.    Time  4  Period  Weeks    Status  New      PT LONG TERM GOAL #3   Title  Walk in clinic 500 feet without gait antalgia.    Time  4    Period  Weeks    Status  New      PT LONG TERM GOAL #4   Title  Perform a reciprocating stair gait with one railing with pain not > 3/10.    Time  4    Period  Weeks    Status  New             Plan - 08/08/17 1657    Clinical Impression Statement  The patient presents to  [facility] with c/o chronic left knee pain.  She demonstrates full active range of motion.  She has a great deal of pain especially with weight bearing which impairs her functional mobility. She has left hip weakness.  The patient will benefit from skilled physical therapy intervention to include pain-free strengthening exercises.    History and Personal Factors relevant to plan of care:  Bi-polar; arthrits.    Clinical Presentation  Evolving    Clinical Presentation due to:  Increasing left knee pain.    Clinical Decision Making  Low    Rehab Potential  Good    PT Frequency  2x / week    PT Duration  4 weeks    PT Treatment/Interventions  ADLs/Self Care Home Management;Cryotherapy;Electrical Stimulation;Ultrasound;Therapeutic activities;Therapeutic exercise;Patient/family education    PT Next Visit Plan  Pain-free left LE strengthening; modalites PRN.    Consulted and Agree with Plan of Care  Patient       Patient will benefit from skilled therapeutic intervention in order to improve the following deficits and impairments:  Abnormal gait, Decreased activity tolerance, Decreased strength, Pain  Visit Diagnosis: Chronic pain of left knee - Plan: PT plan of care cert/re-cert  Muscle weakness (generalized) - Plan: PT plan of care cert/re-cert     Problem List Patient Active Problem List   Diagnosis Date Noted  . Derangement of posterior horn of medial meniscus of right knee   . Meniscus, lateral, derangement, right   . Arthritis of knee, right   . COPD (chronic obstructive pulmonary disease) ([place]) 03/09/2016  . DIABETES MELLITUS, TYPE II 02/16/2007  . ABUSE, COCAINE, UNSPECIFIED 02/16/2007  . ASTHMA, EXTRINSIC NOS 02/16/2007  . HOMELESSNESS, HX OF 02/16/2007    Quaneisha Hanisch, [place] MPT 08/08/2017, 5:13 PM  [facility] [address] [place], [place], [address] Phone: [phone]   Fax:  [phone]  Name: Rhonda Hickman MRN: [id number] Date of  Birth: 1955-10-10

## 2017-08-10 ENCOUNTER — Encounter: Payer: Self-pay | Admitting: Physical Therapy

## 2017-08-10 ENCOUNTER — Ambulatory Visit: Payer: Self-pay | Admitting: Physical Therapy

## 2017-08-10 DIAGNOSIS — M25562 Pain in left knee: Principal | ICD-10-CM

## 2017-08-10 DIAGNOSIS — G8929 Other chronic pain: Secondary | ICD-10-CM

## 2017-08-10 DIAGNOSIS — M6281 Muscle weakness (generalized): Secondary | ICD-10-CM

## 2017-08-10 NOTE — Therapy (Signed)
[facility] [place], [place], [address] Phone: [phone]   Fax:  [phone]  Physical Therapy Treatment  Patient Details  Name: Rhonda Hickman MRN: [id number] Date of Birth: 02-25-1956 No Data Recorded  Encounter Date: 08/10/2017  PT End of Session - 08/10/17 4270    Visit Number  2    Number of Visits  8    Date for PT Re-Evaluation  09/05/17    Authorization Time Period  04/22/16 to 06/17/16    PT Start Time  0818    PT Stop Time  0851    PT Time Calculation (min)  33 min    Activity Tolerance  Patient tolerated treatment well    Behavior During Therapy  [facility] for tasks assessed/performed;Restless       Past Medical History:  Diagnosis Date  . Anxiety   . Asthma   . Bipolar 1 disorder ([place])   . Bulging lumbar disc   . Chronic pain of left knee   . COPD (chronic obstructive pulmonary disease) ([place])   . Diabetes mellitus     Past Surgical History:  Procedure Laterality Date  . ABDOMINAL HYSTERECTOMY    . CARPAL TUNNEL RELEASE Right   . KNEE ARTHROSCOPY WITH MEDIAL MENISECTOMY Right 04/14/2016   Procedure: KNEE ARTHROSCOPY WITH MEDIAL AND LATERAL MENISECTOMY, MICROFRACTURE REPAIR;  Surgeon: Carole Civil, MD;  Location: [facility];  Service: Orthopedics;  Laterality: Right;  lateral menisectomy - needs crutch training    There were no vitals filed for this visit.  Subjective Assessment - 08/10/17 0819    Subjective  Reports 8/10 upon arrival and knee giving out during walking. "I just want them to do the surgery."    Pertinent History  asthma, anxiety, COPD, DM, bipolar disorder, arthritis, right knee arthroscopic surgery.    Limitations  Walking    How long can you sit comfortably?  unlimited     How long can you stand comfortably?  10 to 15 minutes     How long can you walk comfortably?  with walker, unlimited; without walker only able to walk for several minutes     Patient Stated Goals  Have surgery if  physical therapy doesn't help    Currently in Pain?  Yes    Pain Score  8     Pain Location  Knee    Pain Orientation  Left    Pain Descriptors / Indicators  Stabbing    Pain Type  Chronic pain    Pain Onset  More than a month ago    Aggravating Factors   Increased weightbearing         [facility] PT Assessment - 08/10/17 0001      Assessment   Medical Diagnosis  Primary OA of left knee.    Next MD Visit  09/06/2017      Restrictions   Weight Bearing Restrictions  No                  [facility] Adult PT Treatment/Exercise - 08/10/17 0001      Knee/Hip Exercises: Aerobic   Nustep  L2 x8 min, seat 15      Knee/Hip Exercises: Supine   Short Arc Quad Sets  AROM;Left;5 reps      Modalities   Modalities  Vasopneumatic      Vasopneumatic   Number Minutes Vasopneumatic   15 minutes    Vasopnuematic Location   Knee    Vasopneumatic Pressure  Medium  Vasopneumatic Temperature   59               PT Short Term Goals - 04/22/16 1629      PT SHORT TERM GOAL #1   Title  Pt will demo consistency and independence with her HEP to improve knee ROM and strength     Time  2    Period  Weeks    Status  New      PT SHORT TERM GOAL #2   Title  Pt will demo improved technique with sit to stand, evident by equal weight shift, without cues from therapist x10 reps.     Time  2    Period  Weeks    Status  New      PT SHORT TERM GOAL #3   Title  Pt will demo improved gait mechanics evident by symmetrical step length and heel toe sequencing x171ft without cues from therapist, using LRAD     Time  4    Period  Weeks    Status  New        PT Long Term Goals - 08/08/17 1708      PT LONG TERM GOAL #1   Title  Pt will demo improved BLE strength to 5/5 MMT to improve her safety with functional tasks.    Time  4    Period  Weeks    Status  New      PT LONG TERM GOAL #2   Title  Independent with a HEP.    Time  4    Period  Weeks    Status  New      PT LONG TERM GOAL  #3   Title  Walk in clinic 500 feet without gait antalgia.    Time  4    Period  Weeks    Status  New      PT LONG TERM GOAL #4   Title  Perform a reciprocating stair gait with one railing with pain not > 3/10.    Time  4    Period  Weeks    Status  New            Plan - 08/10/17 0840    Clinical Impression Statement  Patient tolerated today's treatment limited by reported pain. Patient arrived with 8/10 knee pain but increased to 8.5/10 with limited SAQ reps and to 9/10 following a coughing spell. Patient reports heavy sensation with SAQ of the L knee. Patient ambulates with antalgic gait and reports of L knee giving way during ambulation. Patient ambulates very slowly and ROM of the L knee completed very slowly. Due to jeans donned patient opted to not have electrical stimulation and opted for only vasopneumatic system. Normal vasopnuematic response following end of modality session with patient reporting that ice "felt good." Patient educated that 15-20 minutes of ice application assists with pain reduction.    Rehab Potential  Good    PT Frequency  2x / week    PT Duration  4 weeks    PT Treatment/Interventions  ADLs/Self Care Home Management;Cryotherapy;Electrical Stimulation;Ultrasound;Therapeutic activities;Therapeutic exercise;Patient/family education    PT Next Visit Plan  Pain-free left LE strengthening; modalites PRN.    Consulted and Agree with Plan of Care  Patient       Patient will benefit from skilled therapeutic intervention in order to improve the following deficits and impairments:  Abnormal gait, Decreased activity tolerance, Decreased strength, Pain  Visit Diagnosis: Chronic pain of left  knee  Muscle weakness (generalized)     Problem List Patient Active Problem List   Diagnosis Date Noted  . Derangement of posterior horn of medial meniscus of right knee   . Meniscus, lateral, derangement, right   . Arthritis of knee, right   . COPD (chronic  obstructive pulmonary disease) ([place]) 03/09/2016  . DIABETES MELLITUS, TYPE II 02/16/2007  . ABUSE, COCAINE, UNSPECIFIED 02/16/2007  . ASTHMA, EXTRINSIC NOS 02/16/2007  . HOMELESSNESS, HX OF 02/16/2007    Standley Brooking, PTA 08/10/2017, 8:55 AM  [facility] [address] [place], [place], [address] Phone: [phone]   Fax:  [phone]  Name: Rhonda Hickman MRN: [id number] Date of Birth: [birth year]

## 2017-08-14 ENCOUNTER — Encounter: Payer: Self-pay | Admitting: Physical Therapy

## 2017-08-14 ENCOUNTER — Ambulatory Visit: Payer: No Typology Code available for payment source | Attending: Orthopedic Surgery | Admitting: Physical Therapy

## 2017-08-14 DIAGNOSIS — M25562 Pain in left knee: Secondary | ICD-10-CM | POA: Insufficient documentation

## 2017-08-14 DIAGNOSIS — M6281 Muscle weakness (generalized): Secondary | ICD-10-CM | POA: Insufficient documentation

## 2017-08-14 DIAGNOSIS — G8929 Other chronic pain: Secondary | ICD-10-CM | POA: Insufficient documentation

## 2017-08-14 NOTE — Therapy (Signed)
[facility] [place], [place], [address] Phone: [phone]   Fax:  [phone]  Physical Therapy Treatment  Patient Details  Name: Rhonda Hickman MRN: [id number] Date of Birth: [birth year] No Data Recorded  Encounter Date: 08/14/2017  PT End of Session - 08/14/17 1457    Visit Number  3    Number of Visits  8    Authorization Time Period  04/22/16 to 06/17/16    PT Start Time  0218    PT Stop Time  6384 Patient requesting abbreviated treatment due to not feeling well.    PT Time Calculation (min)  24 min    Activity Tolerance  Patient tolerated treatment well    Behavior During Therapy  [facility] for tasks assessed/performed;Restless       Past Medical History:  Diagnosis Date  . Anxiety   . Asthma   . Bipolar 1 disorder ([place])   . Bulging lumbar disc   . Chronic pain of left knee   . COPD (chronic obstructive pulmonary disease) ([place])   . Diabetes mellitus     Past Surgical History:  Procedure Laterality Date  . ABDOMINAL HYSTERECTOMY    . CARPAL TUNNEL RELEASE Right   . KNEE ARTHROSCOPY WITH MEDIAL MENISECTOMY Right 04/14/2016   Procedure: KNEE ARTHROSCOPY WITH MEDIAL AND LATERAL MENISECTOMY, MICROFRACTURE REPAIR;  Surgeon: Carole Civil, MD;  Location: [facility];  Service: Orthopedics;  Laterality: Right;  lateral menisectomy - needs crutch training    There were no vitals filed for this visit.  Subjective Assessment - 08/14/17 1459    Subjective  Patient requesting abbreviated treatment due to not feeling well.    Pain Score  10-Worst pain ever    Pain Location  Knee    Pain Orientation  Left    Pain Type  Chronic pain    Pain Onset  More than a month ago                      [facility] Adult PT Treatment/Exercise - 08/14/17 0001      Exercises   Exercises  Knee/Hip      Knee/Hip Exercises: Aerobic   Nustep  Level 1 x 8 minutes.      Modalities   Modalities  Vasopneumatic      Vasopneumatic    Number Minutes Vasopneumatic   10 minutes    Vasopnuematic Location   -- Left knee.    Vasopneumatic Pressure  Low               PT Short Term Goals - 04/22/16 1629      PT SHORT TERM GOAL #1   Title  Pt will demo consistency and independence with her HEP to improve knee ROM and strength     Time  2    Period  Weeks    Status  New      PT SHORT TERM GOAL #2   Title  Pt will demo improved technique with sit to stand, evident by equal weight shift, without cues from therapist x10 reps.     Time  2    Period  Weeks    Status  New      PT SHORT TERM GOAL #3   Title  Pt will demo improved gait mechanics evident by symmetrical step length and heel toe sequencing x150ft without cues from therapist, using LRAD     Time  4    Period  Weeks  Status  New        PT Long Term Goals - 08/08/17 1708      PT LONG TERM GOAL #1   Title  Pt will demo improved BLE strength to 5/5 MMT to improve her safety with functional tasks.    Time  4    Period  Weeks    Status  New      PT LONG TERM GOAL #2   Title  Independent with a HEP.    Time  4    Period  Weeks    Status  New      PT LONG TERM GOAL #3   Title  Walk in clinic 500 feet without gait antalgia.    Time  4    Period  Weeks    Status  New      PT LONG TERM GOAL #4   Title  Perform a reciprocating stair gait with one railing with pain not > 3/10.    Time  4    Period  Weeks    Status  New            Plan - 08/14/17 1501    Clinical Impression Statement  Patient in a lot of pain today and not feeling well.  She requested an abbreviated treatment.    PT Treatment/Interventions  ADLs/Self Care Home Management;Cryotherapy;Electrical Stimulation;Ultrasound;Therapeutic activities;Therapeutic exercise;Patient/family education    PT Home Exercise Plan  sit to stand x10 reps with focus on equal weight shift, knee ext stretch x30 min, scar desensitization 5-10 min per day, walking with RW daily        Patient  will benefit from skilled therapeutic intervention in order to improve the following deficits and impairments:  Abnormal gait, Decreased activity tolerance, Decreased strength, Pain  Visit Diagnosis: Muscle weakness (generalized)  Chronic pain of left knee     Problem List Patient Active Problem List   Diagnosis Date Noted  . Derangement of posterior horn of medial meniscus of right knee   . Meniscus, lateral, derangement, right   . Arthritis of knee, right   . COPD (chronic obstructive pulmonary disease) ([place]) 03/09/2016  . DIABETES MELLITUS, TYPE II 02/16/2007  . ABUSE, COCAINE, UNSPECIFIED 02/16/2007  . ASTHMA, EXTRINSIC NOS 02/16/2007  . HOMELESSNESS, HX OF 02/16/2007    Caidan Hubbert, [place] MPT 08/14/2017, 3:09 PM  [facility] [address] [place], [place], [address] Phone: [phone]   Fax:  [phone]  Name: Rhonda Hickman MRN: [id number] Date of Birth: [birth year]

## 2017-08-15 ENCOUNTER — Encounter: Payer: Self-pay | Admitting: Physical Therapy

## 2017-08-17 ENCOUNTER — Encounter: Payer: Self-pay | Admitting: Physical Therapy

## 2017-08-17 ENCOUNTER — Ambulatory Visit: Payer: No Typology Code available for payment source | Admitting: Physical Therapy

## 2017-08-17 DIAGNOSIS — M6281 Muscle weakness (generalized): Secondary | ICD-10-CM

## 2017-08-17 DIAGNOSIS — G8929 Other chronic pain: Secondary | ICD-10-CM

## 2017-08-17 DIAGNOSIS — M25562 Pain in left knee: Principal | ICD-10-CM

## 2017-08-17 NOTE — Therapy (Signed)
[facility] [place], [place], [address] Phone: [phone]   Fax:  [phone]  Physical Therapy Treatment  Patient Details  Name: Rhonda Hickman MRN: [id number] Date of Birth: October 28, 1955 No Data Recorded  Encounter Date: 08/17/2017  PT End of Session - 08/17/17 0835    Visit Number  4    Number of Visits  8    Date for PT Re-Evaluation  09/05/17    PT Start Time  0817    PT Stop Time  0839    PT Time Calculation (min)  22 min    Activity Tolerance  Patient limited by pain    Behavior During Therapy  [facility] for tasks assessed/performed;Restless;Anxious       Past Medical History:  Diagnosis Date  . Anxiety   . Asthma   . Bipolar 1 disorder ([place])   . Bulging lumbar disc   . Chronic pain of left knee   . COPD (chronic obstructive pulmonary disease) ([place])   . Diabetes mellitus     Past Surgical History:  Procedure Laterality Date  . ABDOMINAL HYSTERECTOMY    . CARPAL TUNNEL RELEASE Right   . KNEE ARTHROSCOPY WITH MEDIAL MENISECTOMY Right 04/14/2016   Procedure: KNEE ARTHROSCOPY WITH MEDIAL AND LATERAL MENISECTOMY, MICROFRACTURE REPAIR;  Surgeon: Carole Civil, MD;  Location: [facility];  Service: Orthopedics;  Laterality: Right;  lateral menisectomy - needs crutch training    There were no vitals filed for this visit.  Subjective Assessment - 08/17/17 0827    Subjective  Patient arrived with reports of increased pain and refused exercises today.    Pertinent History  asthma, anxiety, COPD, DM, bipolar disorder, arthritis, right knee arthroscopic surgery.    Limitations  Walking    How long can you sit comfortably?  unlimited     How long can you stand comfortably?  10 to 15 minutes     How long can you walk comfortably?  with walker, unlimited; without walker only able to walk for several minutes     Patient Stated Goals  Have surgery if physical therapy doesn't help    Currently in Pain?  Yes    Pain Score   10-Worst pain ever    Pain Location  Knee    Pain Orientation  Left    Pain Descriptors / Indicators  Stabbing    Pain Type  Chronic pain;Surgical pain    Pain Onset  More than a month ago    Pain Frequency  Constant    Aggravating Factors   any activity    Pain Relieving Factors  ice and rest                      [facility] Adult PT Treatment/Exercise - 08/17/17 0001      Self-Care   Self-Care  Other Self-Care Comments    Other Self-Care Comments   Patient was given HEP and educated on exercises to keep quad muscle in knee strong and try pain free exercises if able to prevent muscle atrophy and further discomfort.       Vasopneumatic   Number Minutes Vasopneumatic   15 minutes    Vasopnuematic Location   Knee    Vasopneumatic Pressure  Low             PT Education - 08/17/17 0834    Education provided  Yes    Education Details  HEP for quad strengthening    Person(s) Educated  Patient    Methods  Explanation;Handout    Comprehension  Verbalized understanding          PT Long Term Goals - 08/08/17 1708      PT LONG TERM GOAL #1   Title  Pt will demo improved BLE strength to 5/5 MMT to improve her safety with functional tasks.    Time  4    Period  Weeks    Status  New      PT LONG TERM GOAL #2   Title  Independent with a HEP.    Time  4    Period  Weeks    Status  New      PT LONG TERM GOAL #3   Title  Walk in clinic 500 feet without gait antalgia.    Time  4    Period  Weeks    Status  New      PT LONG TERM GOAL #4   Title  Perform a reciprocating stair gait with one railing with pain not > 3/10.    Time  4    Period  Weeks    Status  New            Plan - 08/17/17 8343    Clinical Impression Statement  Patient arrived anxious and reported discomfort and refused to attempt exercises. Educated patient on HEP exercises to keep quad muscle and other muscles strong. Patient requested VASO only today. Encouraged patient to attemp pain  free exercises , quad sets and hip exercises. Patient goals not met today, ongoing.     Rehab Potential  Good    PT Frequency  2x / week    PT Duration  4 weeks    PT Treatment/Interventions  ADLs/Self Care Home Management;Cryotherapy;Electrical Stimulation;Ultrasound;Therapeutic activities;Therapeutic exercise;Patient/family education    PT Next Visit Plan  cont with POC for Pain-free left LE strengthening; modalites PRN.    Consulted and Agree with Plan of Care  Patient       Patient will benefit from skilled therapeutic intervention in order to improve the following deficits and impairments:  Abnormal gait, Decreased activity tolerance, Decreased strength, Pain  Visit Diagnosis: Chronic pain of left knee  Muscle weakness (generalized)     Problem List Patient Active Problem List   Diagnosis Date Noted  . Derangement of posterior horn of medial meniscus of right knee   . Meniscus, lateral, derangement, right   . Arthritis of knee, right   . COPD (chronic obstructive pulmonary disease) ([place]) 03/09/2016  . DIABETES MELLITUS, TYPE II 02/16/2007  . ABUSE, COCAINE, UNSPECIFIED 02/16/2007  . ASTHMA, EXTRINSIC NOS 02/16/2007  . HOMELESSNESS, HX OF 02/16/2007    Phillips Climes, PTA 08/17/2017, 8:53 AM  [facility] [place], [place], [address] Phone: [phone]   Fax:  [phone]  Name: Rhonda Hickman MRN: [id number] Date of Birth: 07/28/1955

## 2017-08-17 NOTE — Patient Instructions (Signed)
  Strengthening: Quadriceps Set   Tighten muscles on top of thighs by pushing knees down into surface. Hold __10__ seconds. Repeat _10___ times per set. Do __2-3__ sets per session. Do _2-3___ sessions per day.     Strengthening: Hip Abduction (Side-Lying)  Strengthening: Straight Leg Raise (Phase 1)  Repeat _10___ times per set. Do __2__ sets per session. Do __2__ sessions per day.  Pelvic Tilt: Posterior - Legs Bent (Supine)    Bridging   Slowly raise buttocks from floor, keeping stomach tight. Repeat _10___ times per set. Do __2__ sets per session. Do __2__ sessions per day.   Straight Leg Raise   Tighten stomach and slowly raise locked right leg __4__ inches from floor. Repeat __10-30__ times per set. Do __2__ sets per session. Do __2__ sessions per day.

## 2017-08-19 ENCOUNTER — Encounter (HOSPITAL_COMMUNITY): Payer: Self-pay | Admitting: Emergency Medicine

## 2017-08-19 ENCOUNTER — Emergency Department (HOSPITAL_COMMUNITY)
Admission: EM | Admit: 2017-08-19 | Discharge: 2017-08-19 | Discharge status: Against Medical Advice | Payer: No Typology Code available for payment source | Attending: Emergency Medicine | Admitting: Emergency Medicine

## 2017-08-19 ENCOUNTER — Emergency Department (HOSPITAL_COMMUNITY): Payer: No Typology Code available for payment source

## 2017-08-19 DIAGNOSIS — Z5321 Procedure and treatment not carried out due to patient leaving prior to being seen by health care provider: Secondary | ICD-10-CM | POA: Insufficient documentation

## 2017-08-19 DIAGNOSIS — J4521 Mild intermittent asthma with (acute) exacerbation: Secondary | ICD-10-CM | POA: Insufficient documentation

## 2017-08-19 DIAGNOSIS — Z79899 Other long term (current) drug therapy: Secondary | ICD-10-CM | POA: Insufficient documentation

## 2017-08-19 DIAGNOSIS — J45909 Unspecified asthma, uncomplicated: Secondary | ICD-10-CM | POA: Insufficient documentation

## 2017-08-19 DIAGNOSIS — R197 Diarrhea, unspecified: Secondary | ICD-10-CM | POA: Insufficient documentation

## 2017-08-19 DIAGNOSIS — E1140 Type 2 diabetes mellitus with diabetic neuropathy, unspecified: Secondary | ICD-10-CM | POA: Insufficient documentation

## 2017-08-19 DIAGNOSIS — R0602 Shortness of breath: Secondary | ICD-10-CM | POA: Insufficient documentation

## 2017-08-19 DIAGNOSIS — F17210 Nicotine dependence, cigarettes, uncomplicated: Secondary | ICD-10-CM | POA: Insufficient documentation

## 2017-08-19 DIAGNOSIS — Z794 Long term (current) use of insulin: Secondary | ICD-10-CM | POA: Insufficient documentation

## 2017-08-19 DIAGNOSIS — J449 Chronic obstructive pulmonary disease, unspecified: Secondary | ICD-10-CM | POA: Insufficient documentation

## 2017-08-19 HISTORY — DX: Polyneuropathy, unspecified: G62.9

## 2017-08-19 MED ORDER — ALBUTEROL SULFATE (2.5 MG/3ML) 0.083% IN NEBU
5.0000 mg | INHALATION_SOLUTION | Freq: Once | RESPIRATORY_TRACT | Status: AC
  Administered 2017-08-20: 5 mg via RESPIRATORY_TRACT
  Filled 2017-08-19: qty 6

## 2017-08-19 NOTE — ED Notes (Signed)
Pt not in waiting area x 3 

## 2017-08-19 NOTE — ED Triage Notes (Signed)
Pt c/o shortness of breath x one week. Using home nebs without relief of symptoms. Pt states she "needs a steroid". Last use of nebulizer was at 2300.

## 2017-08-19 NOTE — ED Notes (Signed)
Have updated pt on wait time and rechecked vital signs. In NAD

## 2017-08-19 NOTE — ED Triage Notes (Signed)
Pt reports shortness of breath and wheezing for about a week.  Using home nebs with no relief.  Last one 6 hours ago.

## 2017-08-20 ENCOUNTER — Emergency Department (HOSPITAL_COMMUNITY)
Admission: EM | Admit: 2017-08-20 | Discharge: 2017-08-20 | Disposition: A | Discharge status: Home / Self Care | Payer: No Typology Code available for payment source | Attending: Emergency Medicine | Admitting: Emergency Medicine

## 2017-08-20 DIAGNOSIS — J4521 Mild intermittent asthma with (acute) exacerbation: Secondary | ICD-10-CM

## 2017-08-20 DIAGNOSIS — R197 Diarrhea, unspecified: Secondary | ICD-10-CM

## 2017-08-20 LAB — CBG MONITORING, ED: GLUCOSE-CAPILLARY: 179 mg/dL — AB (ref 65–99)

## 2017-08-20 MED ORDER — IPRATROPIUM BROMIDE 0.02 % IN SOLN: 0.5000 mg | Freq: Once | RESPIRATORY_TRACT | Status: DC

## 2017-08-20 MED ORDER — ALBUTEROL SULFATE HFA 108 (90 BASE) MCG/ACT IN AERS: 2.0000 | INHALATION_SPRAY | RESPIRATORY_TRACT | 0 refills | Status: DC | PRN

## 2017-08-20 MED ORDER — PREDNISONE 20 MG PO TABS: ORAL_TABLET | ORAL | 0 refills | Status: DC

## 2017-08-20 MED ORDER — PREDNISONE 20 MG PO TABS
40.0000 mg | ORAL_TABLET | Freq: Once | ORAL | Status: AC
  Administered 2017-08-20: 40 mg via ORAL
  Filled 2017-08-20: qty 2

## 2017-08-20 MED ORDER — DM-GUAIFENESIN ER 30-600 MG PO TB12
1.0000 | ORAL_TABLET | Freq: Two times a day (BID) | ORAL | Status: DC
  Administered 2017-08-20: 1 via ORAL
  Filled 2017-08-20: qty 1

## 2017-08-20 MED ORDER — ALBUTEROL SULFATE (2.5 MG/3ML) 0.083% IN NEBU: 5.0000 mg | INHALATION_SOLUTION | Freq: Once | RESPIRATORY_TRACT | Status: DC

## 2017-08-20 NOTE — ED Notes (Signed)
Pt ambulated around ED unit without getting SOB while maintaining 97-100 SpO2

## 2017-08-20 NOTE — Discharge Instructions (Signed)
Drink plenty of fluids.  Use your inhaler 2 puffs every 4-6 hours as needed for wheezing or shortness of breath.  Take Mucinex DM over-the-counter for cough.  Take the prednisone as directed.  While you are on the prednisone your blood sugar may be very hard to control.  Please watch your diet while you are on the prednisone.  Take Imodium right ear over-the-counter for your diarrhea.  Avoid milk products until the diarrhea is gone.  Recheck if you get a high fever or you struggle to breathe or seem worse.

## 2017-08-20 NOTE — ED Notes (Signed)
Pt given Sprite with Ice

## 2017-08-20 NOTE — ED Notes (Signed)
Pt states she feels "gittery" after first breathing treatment and states she does not want the second one.

## 2017-08-20 NOTE — ED Provider Notes (Signed)
[facility] EMERGENCY DEPARTMENT Provider Note   CSN: [id number] Arrival date & time: 08/19/17  2339  Time seen 02:00 AM   History   Chief Complaint Chief Complaint  Patient presents with  . Shortness of Breath    HPI Rhonda Hickman is a 62 y.o. female.  HPI patient states she has a history of asthma.  She states her flareup started this past week.  She has had a cough with dry mucus production, but denies rhinorrhea or sneezing.  She has not had fever.  She states her chest is sore from coughing.  She denies nausea or vomiting but has had had diarrhea the past week.  She describes 3-4 episodes a day and describes it as watery.  She denies abdominal pain.  She states she is having normal urination.  She states her CBGs have been getting low, probably because of the diarrhea.  She states her last admission for asthma was sometime last year when she was admitted in [place].  [facility], [facility]   Past Medical History:  Diagnosis Date  . Anxiety   . Asthma   . Bipolar 1 disorder ([place])   . Bulging lumbar disc   . Chronic pain of left knee   . COPD (chronic obstructive pulmonary disease) ([place])   . Diabetes mellitus   . Neuropathy     Patient Active Problem List   Diagnosis Date Noted  . Derangement of posterior horn of medial meniscus of right knee   . Meniscus, lateral, derangement, right   . Arthritis of knee, right   . COPD (chronic obstructive pulmonary disease) ([place]) 03/09/2016  . DIABETES MELLITUS, TYPE II 02/16/2007  . ABUSE, COCAINE, UNSPECIFIED 02/16/2007  . ASTHMA, EXTRINSIC NOS 02/16/2007  . HOMELESSNESS, HX OF 02/16/2007    Past Surgical History:  Procedure Laterality Date  . ABDOMINAL HYSTERECTOMY    . CARPAL TUNNEL RELEASE Right   . KNEE ARTHROSCOPY WITH MEDIAL MENISECTOMY Right 04/14/2016   Procedure: KNEE ARTHROSCOPY WITH MEDIAL AND LATERAL MENISECTOMY, MICROFRACTURE REPAIR;  Surgeon: Carole Civil, MD;  Location: [facility];   Service: Orthopedics;  Laterality: Right;  lateral menisectomy - needs crutch training    OB History    No data available       Home Medications    Prior to Admission medications   Medication Sig Start Date End Date Taking? Authorizing Provider  albuterol (PROVENTIL HFA;VENTOLIN HFA) 108 (90 BASE) MCG/ACT inhaler Inhale 2 puffs into the lungs every 6 (six) hours as needed for wheezing.    [provider]  albuterol (PROVENTIL HFA;VENTOLIN HFA) 108 (90 Base) MCG/ACT inhaler Inhale 2 puffs into the lungs every 4 (four) hours as needed. 08/20/17   Rolland Porter, MD  albuterol (PROVENTIL) (2.5 MG/3ML) 0.083% nebulizer solution Take 2.5 mg by nebulization every 6 (six) hours as needed for shortness of breath.     [provider]  amLODipine (NORVASC) 5 MG tablet Take 5 mg by mouth daily.    [provider]  citalopram (CELEXA) 20 MG tablet Take 20 mg by mouth daily.    [provider]  diclofenac (VOLTAREN) 75 MG EC tablet TAKE  (1)  TABLET TWICE A DAY WITH MEALS (BREAKFAST AND SUPPER) 06/28/17   Carole Civil, MD  divalproex (DEPAKOTE ER) 500 MG 24 hr tablet Take 1,000-1,500 mg by mouth 2 (two) times daily. 2 tablets in the morning and 3 tablets at bedtime.    [provider]  gabapentin (NEURONTIN) 300  MG capsule Take 600 mg by mouth 3 (three) times daily.     [provider]  HYDROcodone-acetaminophen (NORCO/VICODIN) 5-325 MG tablet Take 1 tablet by mouth every 6 (six) hours as needed for severe pain. Patient not taking: Reported on 06/28/2017 01/17/17   Evalee Jefferson, PA-C  insulin NPH-regular (NOVOLIN 70/30) (70-30) 100 UNIT/ML injection Inject 50 Units into the skin 3 (three) times daily.     [provider]  oxybutynin (DITROPAN) 5 MG tablet Take 5 mg by mouth 2 (two) times daily.    [provider]  predniSONE (DELTASONE) 20 MG tablet Take 2 po QD x 4d then 1 po QD x 4d 08/20/17   Rolland Porter, MD  risperiDONE  (RISPERDAL) 0.25 MG tablet Take 0.25 mg by mouth at bedtime.    [provider]  traMADol (ULTRAM) 50 MG tablet Take 1 tablet (50 mg total) by mouth every 6 (six) hours as needed. 07/17/17   Carole Civil, MD  traZODone (DESYREL) 100 MG tablet Take 200 mg by mouth at bedtime.     [provider]    Family History No family history on file.  Social History Social History   Tobacco Use  . Smoking status: Current Some Day Smoker    Packs/day: 0.50    Types: Cigarettes  . Smokeless tobacco: Never Used  Substance Use Topics  . Alcohol use: No  . Drug use: No  applying for disability   Allergies   Aspirin and Ibuprofen   Review of Systems Review of Systems  All other systems reviewed and are negative.    Physical Exam Updated Vital Signs BP 120/90 (BP Location: Left Arm)   Pulse 88   Temp 98.3 F (36.8 C) (Oral)   Resp 18   Ht 6' (1.829 m)   Wt 124.7 kg (275 lb)   SpO2 97%   BMI 37.30 kg/m   Physical Exam  Constitutional: She is oriented to person, place, and time. She appears well-developed and well-nourished.  Non-toxic appearance. She does not appear ill. No distress.  HENT:  Head: Normocephalic and atraumatic.  Right Ear: External ear normal.  Left Ear: External ear normal.  Nose: Nose normal. No mucosal edema or rhinorrhea.  Mouth/Throat: Oropharynx is clear and moist and mucous membranes are normal. No dental abscesses or uvula swelling.  Eyes: Conjunctivae and EOM are normal. Pupils are equal, round, and reactive to light.  Neck: Normal range of motion and full passive range of motion without pain. Neck supple.  Cardiovascular: Normal rate, regular rhythm and normal heart sounds. Exam reveals no gallop and no friction rub.  No murmur heard. Pulmonary/Chest: Effort normal. No respiratory distress. She has decreased breath sounds. She has wheezes. She has no rhonchi. She has no rales. She exhibits no tenderness and no crepitus.  Patient  had had one nebulizer prior to my exam.  Abdominal: Soft. Normal appearance and bowel sounds are normal. She exhibits no distension. There is no tenderness. There is no rebound and no guarding.  Musculoskeletal: Normal range of motion. She exhibits no edema or tenderness.  Moves all extremities well.   Neurological: She is alert and oriented to person, place, and time. She has normal strength. No cranial nerve deficit.  Skin: Skin is warm, dry and intact. No rash noted. No erythema. No pallor.  Psychiatric: She has a normal mood and affect. Her speech is normal and behavior is normal. Her mood appears not anxious.  Nursing note and vitals reviewed.  ED Treatments / Results  Labs (all labs ordered are listed, but only abnormal results are displayed) Labs Reviewed  CBG MONITORING, ED - Abnormal; Notable for the following components:      Result Value   Glucose-Capillary 179 (*)    All other components within normal limits   Laboratory interpretation all normal except hyperglycemia   EKG  EKG Interpretation None       Radiology Dg Chest 2 View  Result Date: 08/19/2017 CLINICAL DATA:  Wheezing, shortness of Breath EXAM: CHEST  2 VIEW COMPARISON:  01/23/2017 FINDINGS: Heart and mediastinal contours are within normal limits. No focal opacities or effusions. No acute bony abnormality. IMPRESSION: No active cardiopulmonary disease. Electronically Signed   By: Rolm Baptise M.D.   On: 08/19/2017 16:36    Procedures Procedures (including critical care time)  Medications Ordered in ED Medications  albuterol (PROVENTIL) (2.5 MG/3ML) 0.083% nebulizer solution 5 mg (5 mg Nebulization Not Given 08/20/17 0256)  ipratropium (ATROVENT) nebulizer solution 0.5 mg (0.5 mg Nebulization Not Given 08/20/17 0257)  predniSONE (DELTASONE) tablet 40 mg (not administered)  dextromethorphan-guaiFENesin (MUCINEX DM) 30-600 MG per 12 hr tablet 1 tablet (not administered)  albuterol (PROVENTIL) (2.5  MG/3ML) 0.083% nebulizer solution 5 mg (5 mg Nebulization Given 08/20/17 0145)     Initial Impression / Assessment and Plan / ED Course  I have reviewed the triage vital signs and the nursing notes.  Pertinent labs & imaging results that were available during my care of the patient were reviewed by me and considered in my medical decision making (see chart for details).    Patient had already had a albuterol nebulizer at time of my exam.  She was given a second albuterol nebulizer treatment.  Patient has diabetes and her CBG was evaluated.  Recheck at 3:45 AM patient has some improved air movement, she has rare wheezing.  She does not want another nebulizer treatment.  I am going to have nursing staff ambulate her to see what her pulse ox does.  Nursing staff report patient ambulated around the ED without getting short of breath, her pulse ox remained 97-100% on room air.  Patient is noted to have lots of coughing spasms in the ED.  She was given Mucinex DM and started on prednisone.  Final Clinical Impressions(s) / ED Diagnoses   Final diagnoses:  Mild intermittent asthma with exacerbation  Diarrhea, unspecified type    ED Discharge Orders        Ordered    albuterol (PROVENTIL HFA;VENTOLIN HFA) 108 (90 Base) MCG/ACT inhaler  Every 4 hours PRN     08/20/17 0521    predniSONE (DELTASONE) 20 MG tablet     08/20/17 0522    OTC imodium and mucinex DM  Plan discharge  Rolland Porter, MD, Barbette Or, MD 08/20/17 [phone]

## 2017-08-20 NOTE — ED Notes (Signed)
This RN made respiratory aware that this pt needs another breathing treatment.

## 2017-08-21 ENCOUNTER — Ambulatory Visit: Payer: No Typology Code available for payment source | Admitting: Physical Therapy

## 2017-08-21 ENCOUNTER — Encounter: Payer: Self-pay | Admitting: Physical Therapy

## 2017-08-21 DIAGNOSIS — G8929 Other chronic pain: Secondary | ICD-10-CM

## 2017-08-21 DIAGNOSIS — M25562 Pain in left knee: Principal | ICD-10-CM

## 2017-08-21 DIAGNOSIS — M6281 Muscle weakness (generalized): Secondary | ICD-10-CM

## 2017-08-21 NOTE — Therapy (Signed)
[facility] [place], [place], [address] Phone: [phone]   Fax:  [phone]  Physical Therapy Treatment  Patient Details  Name: Rhonda Hickman MRN: [id number] Date of Birth: 02/04/1956 No Data Recorded  Encounter Date: 08/21/2017  PT End of Session - 08/21/17 0832    Visit Number  5    Number of Visits  8    Date for PT Re-Evaluation  09/05/17    Authorization Time Period  04/22/16 to 06/17/16    PT Start Time  0815    PT Stop Time  0845    PT Time Calculation (min)  30 min    Activity Tolerance  Patient limited by pain    Behavior During Therapy  [facility] for tasks assessed/performed;Restless       Past Medical History:  Diagnosis Date  . Anxiety   . Asthma   . Bipolar 1 disorder ([place])   . Bulging lumbar disc   . Chronic pain of left knee   . COPD (chronic obstructive pulmonary disease) ([place])   . Diabetes mellitus   . Neuropathy     Past Surgical History:  Procedure Laterality Date  . ABDOMINAL HYSTERECTOMY    . CARPAL TUNNEL RELEASE Right   . KNEE ARTHROSCOPY WITH MEDIAL MENISECTOMY Right 04/14/2016   Procedure: KNEE ARTHROSCOPY WITH MEDIAL AND LATERAL MENISECTOMY, MICROFRACTURE REPAIR;  Surgeon: Carole Civil, MD;  Location: [facility];  Service: Orthopedics;  Laterality: Right;  lateral menisectomy - needs crutch training    There were no vitals filed for this visit.  Subjective Assessment - 08/21/17 0818    Subjective  Patient arrived with reported ongoing increased discomfort, was encouraged to try low level pain free quad strength, patient agreed to try    Pertinent History  asthma, anxiety, COPD, DM, bipolar disorder, arthritis, right knee arthroscopic surgery.    Limitations  Walking    How long can you sit comfortably?  unlimited     How long can you stand comfortably?  10 to 15 minutes     How long can you walk comfortably?  with walker, unlimited; without walker only able to walk for several  minutes     Patient Stated Goals  Have surgery if physical therapy doesn't help    Currently in Pain?  Yes    Pain Score  8     Pain Location  Knee    Pain Orientation  Left    Pain Descriptors / Indicators  Aching;Discomfort    Pain Type  Surgical pain    Pain Onset  More than a month ago    Pain Frequency  Constant    Aggravating Factors   increased activity with knee    Pain Relieving Factors  ice and rest                      [facility] Adult PT Treatment/Exercise - 08/21/17 0001      Knee/Hip Exercises: Aerobic   Nustep  L1 with UE/LE activity x2mn      Knee/Hip Exercises: Seated   Long Arc Quad  Strengthening;Left;10 reps;Limitations    Long Arc Quad Limitations  some discomfort reported      Knee/Hip Exercises: Supine   Quad Sets  Strengthening;Left;20 reps    Hip Adduction Isometric  Strengthening;Both;20 reps using grey ball for resistance    Other Supine Knee/Hip Exercises  hip abd with red t-band x20 some discomfort reported  PT Long Term Goals - 08/21/17 0843      PT LONG TERM GOAL #1   Title  Pt will demo improved BLE strength to 5/5 MMT to improve her safety with functional tasks.    Time  4    Period  Weeks    Status  On-going      PT LONG TERM GOAL #2   Title  Independent with a HEP.    Time  4    Period  Weeks    Status  Achieved      PT LONG TERM GOAL #3   Title  Walk in clinic 500 feet without gait antalgia.    Time  4    Period  Weeks    Status  On-going      PT LONG TERM GOAL #4   Title  Perform a reciprocating stair gait with one railing with pain not > 3/10.    Time  4    Period  Weeks    Status  On-going      PT LONG TERM GOAL #5   Title  Pt will report no more than 3/10 pain with daily activity to improve her comfort and tolerance with ADLs.     Time  8    Period  Weeks    Status  On-going            Plan - 08/21/17 1552    Clinical Impression Statement  Patient arrived with reported  ongoing pain. Patient reported doing HEP as given last treatment and did well with it. Patient able to progress with low level exercises yet very limited and only half range to keep pain free. Patient only able to perfom little exercises today and requested to end early. Met LTG #2 for HEP and other goals ongoing due to pain and strength limitations.     Rehab Potential  Good    PT Frequency  2x / week    PT Duration  4 weeks    PT Treatment/Interventions  ADLs/Self Care Home Management;Cryotherapy;Electrical Stimulation;Ultrasound;Therapeutic activities;Therapeutic exercise;Patient/family education    PT Next Visit Plan  cont with POC for Pain-free left LE strengthening; modalites PRN.    Consulted and Agree with Plan of Care  Patient       Patient will benefit from skilled therapeutic intervention in order to improve the following deficits and impairments:  Abnormal gait, Decreased activity tolerance, Decreased strength, Pain  Visit Diagnosis: Chronic pain of left knee  Muscle weakness (generalized)     Problem List Patient Active Problem List   Diagnosis Date Noted  . Derangement of posterior horn of medial meniscus of right knee   . Meniscus, lateral, derangement, right   . Arthritis of knee, right   . COPD (chronic obstructive pulmonary disease) ([place]) 03/09/2016  . DIABETES MELLITUS, TYPE II 02/16/2007  . ABUSE, COCAINE, UNSPECIFIED 02/16/2007  . ASTHMA, EXTRINSIC NOS 02/16/2007  . HOMELESSNESS, HX OF 02/16/2007    Phillips Climes, PTA 08/21/2017, 8:45 AM  [facility] [place], [place], [address] Phone: [phone]   Fax:  [phone]  Name: Rhonda Hickman MRN: [id number] Date of Birth: 1955-12-05

## 2017-08-23 ENCOUNTER — Encounter: Payer: Self-pay | Admitting: Physical Therapy

## 2017-08-28 ENCOUNTER — Encounter: Payer: Self-pay | Admitting: Physical Therapy

## 2017-08-28 ENCOUNTER — Ambulatory Visit: Payer: No Typology Code available for payment source | Admitting: Physical Therapy

## 2017-08-28 DIAGNOSIS — M25562 Pain in left knee: Principal | ICD-10-CM

## 2017-08-28 DIAGNOSIS — M6281 Muscle weakness (generalized): Secondary | ICD-10-CM

## 2017-08-28 DIAGNOSIS — G8929 Other chronic pain: Secondary | ICD-10-CM

## 2017-08-28 NOTE — Therapy (Signed)
[facility] [place], [place], [address] Phone: [phone]   Fax:  [phone]  Physical Therapy Treatment  Patient Details  Name: Falan Hensler MRN: [id number] Date of Birth: 1956-02-06 No Data Recorded  Encounter Date: 08/28/2017  PT End of Session - 08/28/17 0841    Visit Number  6    Number of Visits  8    Date for PT Re-Evaluation  09/05/17    Authorization Time Period  04/22/16 to 06/17/16    PT Start Time  0819    PT Stop Time  0850    PT Time Calculation (min)  31 min    Activity Tolerance  Patient limited by pain    Behavior During Therapy  [facility] for tasks assessed/performed;Restless       Past Medical History:  Diagnosis Date  . Anxiety   . Asthma   . Bipolar 1 disorder ([place])   . Bulging lumbar disc   . Chronic pain of left knee   . COPD (chronic obstructive pulmonary disease) ([place])   . Diabetes mellitus   . Neuropathy     Past Surgical History:  Procedure Laterality Date  . ABDOMINAL HYSTERECTOMY    . CARPAL TUNNEL RELEASE Right   . KNEE ARTHROSCOPY WITH MEDIAL MENISECTOMY Right 04/14/2016   Procedure: KNEE ARTHROSCOPY WITH MEDIAL AND LATERAL MENISECTOMY, MICROFRACTURE REPAIR;  Surgeon: Carole Civil, MD;  Location: [facility];  Service: Orthopedics;  Laterality: Right;  lateral menisectomy - needs crutch training    There were no vitals filed for this visit.  Subjective Assessment - 08/28/17 0826    Subjective  Patient arrived with reported 10/10 pain and no improvement    Pertinent History  asthma, anxiety, COPD, DM, bipolar disorder, arthritis, right knee arthroscopic surgery.    Limitations  Walking    How long can you sit comfortably?  unlimited     How long can you stand comfortably?  10 to 15 minutes     How long can you walk comfortably?  with walker, unlimited; without walker only able to walk for several minutes     Patient Stated Goals  Have surgery if physical therapy doesn't help    Currently in Pain?  Yes    Pain Score  10-Worst pain ever    Pain Location  Knee    Pain Orientation  Left    Pain Descriptors / Indicators  Discomfort    Pain Type  Surgical pain    Pain Onset  More than a month ago    Pain Frequency  Constant    Aggravating Factors   increased activity in knee    Pain Relieving Factors  ice and rest                      [facility] Adult PT Treatment/Exercise - 08/28/17 0001      Knee/Hip Exercises: Aerobic   Nustep  L1 with UE/LE activity x64min      Knee/Hip Exercises: Seated   Long Arc Quad  Strengthening;Left;2 sets;10 reps    Long Arc Quad Limitations  no weight      Knee/Hip Exercises: Supine   Short Arc Quad Sets  Strengthening;Left;2 sets;10 reps    Hip Adduction Isometric  Strengthening;Both;20 reps;Limitations    Hip Adduction Isometric Limitations  grey ball for resistance    Other Supine Knee/Hip Exercises  hip abd with red t-band x20      Vasopneumatic   Number Minutes Vasopneumatic  10 minutes    Vasopnuematic Location   Knee    Vasopneumatic Pressure  Low                  PT Long Term Goals - 08/21/17 0843      PT LONG TERM GOAL #1   Title  Pt will demo improved BLE strength to 5/5 MMT to improve her safety with functional tasks.    Time  4    Period  Weeks    Status  On-going      PT LONG TERM GOAL #2   Title  Independent with a HEP.    Time  4    Period  Weeks    Status  Achieved      PT LONG TERM GOAL #3   Title  Walk in clinic 500 feet without gait antalgia.    Time  4    Period  Weeks    Status  On-going      PT LONG TERM GOAL #4   Title  Perform a reciprocating stair gait with one railing with pain not > 3/10.    Time  4    Period  Weeks    Status  On-going      PT LONG TERM GOAL #5   Title  Pt will report no more than 3/10 pain with daily activity to improve her comfort and tolerance with ADLs.     Time  8    Period  Weeks    Status  On-going            Plan -  08/28/17 3016    Clinical Impression Statement  Patient tolerated treatment fair due to reported ongoing pain in knee. Patient continues to try exercises yet increased discomfort reported with all activities. Patient only able to perform low level exercises. Patient unable to progress any further today per discomfort and requested to leave early. Goals ongoing at this time due to pain and strength deficts.     Rehab Potential  Good    PT Frequency  2x / week    PT Duration  4 weeks    PT Treatment/Interventions  ADLs/Self Care Home Management;Cryotherapy;Electrical Stimulation;Ultrasound;Therapeutic activities;Therapeutic exercise;Patient/family education    PT Next Visit Plan  cont with POC for Pain-free left LE strengthening; modalites PRN.    Consulted and Agree with Plan of Care  Patient       Patient will benefit from skilled therapeutic intervention in order to improve the following deficits and impairments:  Abnormal gait, Decreased activity tolerance, Decreased strength, Pain  Visit Diagnosis: Chronic pain of left knee  Muscle weakness (generalized)     Problem List Patient Active Problem List   Diagnosis Date Noted  . Derangement of posterior horn of medial meniscus of right knee   . Meniscus, lateral, derangement, right   . Arthritis of knee, right   . COPD (chronic obstructive pulmonary disease) ([place]) 03/09/2016  . DIABETES MELLITUS, TYPE II 02/16/2007  . ABUSE, COCAINE, UNSPECIFIED 02/16/2007  . ASTHMA, EXTRINSIC NOS 02/16/2007  . HOMELESSNESS, HX OF 02/16/2007    Phillips Climes, PTA 08/28/2017, 8:54 AM  [facility] [place], [place], [address] Phone: [phone]   Fax:  [phone]  Name: Noemie Devivo MRN: [id number] Date of Birth: 23-Apr-1956

## 2017-08-29 ENCOUNTER — Encounter: Payer: Self-pay | Admitting: Neurology

## 2017-08-29 ENCOUNTER — Ambulatory Visit (INDEPENDENT_AMBULATORY_CARE_PROVIDER_SITE_OTHER): Payer: Self-pay | Admitting: Neurology

## 2017-08-29 VITALS — BP 113/67 | HR 76 | Ht 72.0 in | Wt 293.0 lb

## 2017-08-29 DIAGNOSIS — R32 Unspecified urinary incontinence: Secondary | ICD-10-CM | POA: Insufficient documentation

## 2017-08-29 DIAGNOSIS — R269 Unspecified abnormalities of gait and mobility: Secondary | ICD-10-CM

## 2017-08-29 DIAGNOSIS — G3281 Cerebellar ataxia in diseases classified elsewhere: Secondary | ICD-10-CM

## 2017-08-29 DIAGNOSIS — R202 Paresthesia of skin: Secondary | ICD-10-CM | POA: Insufficient documentation

## 2017-08-29 MED ORDER — GABAPENTIN 300 MG PO CAPS: 900.0000 mg | ORAL_CAPSULE | Freq: Three times a day (TID) | ORAL | 11 refills | Status: DC

## 2017-08-29 NOTE — Progress Notes (Signed)
PATIENT: Rhonda Hickman DOB: [birth year]  Chief Complaint  Patient presents with  . Abnormal Sensations    Reports abnormal sensations in her right leg.  Says it feels like water running down her right leg.  These symptoms started after having right knee surgery in 04/2016.  Says her surgeon is concerned about nerve damage.  She is having difficulty walking.  She is currently doing PT twice weekly.  Marland Kitchen PCP    Health, [facility]  . Orthopaedics    Carole Civil, MD - referring MD     HISTORICAL  Rhonda Hickman is a 62 year old female, seen in refer by orthopedic surgeon Dr. Arther Abbott for evaluation of abnormal sensation in her right leg, her primary care physician is at health, [facility], initial evaluation was on August 29, 2017.  She has past medical history of hypertension, bipolar disorder, diabetes, insulin-dependent  On April 16, 2016, she had right knee arthroscopic surgery with medial and the lateral meniscectomy, microfracture repair by Dr. Aline Brochure.    Shortly afterwards, she began to experience bilateral upper and lower extremity numbness tingling involving right leg more, like water running down her legs, burning sensation across her shoulders, paresthesia from neck down, she often described as burning rising sensation, she also has chronic neck pain, low back pain, frequent urinary incontinence since 2018, use 24 pull-ups each day,  She has significant gait abnormality due to bilateral knee pain,  REVIEW OF SYSTEMS: Full 14 system review of systems performed and notable only for depression, numbness, dizziness, joint pain, shortness of breath, wheezing, trouble swallowing  ALLERGIES: Allergies  Allergen Reactions  . Aspirin Nausea Only and Other (See Comments)    Causes stomach pain  . Ibuprofen     Stomach problems    HOME MEDICATIONS: Current Outpatient Medications  Medication Sig Dispense Refill  . albuterol  (PROVENTIL HFA;VENTOLIN HFA) 108 (90 BASE) MCG/ACT inhaler Inhale 2 puffs into the lungs every 6 (six) hours as needed for wheezing.    Marland Kitchen albuterol (PROVENTIL HFA;VENTOLIN HFA) 108 (90 Base) MCG/ACT inhaler Inhale 2 puffs into the lungs every 4 (four) hours as needed. 6.7 g 0  . albuterol (PROVENTIL) (2.5 MG/3ML) 0.083% nebulizer solution Take 2.5 mg by nebulization every 6 (six) hours as needed for shortness of breath.     Marland Kitchen amLODipine (NORVASC) 5 MG tablet Take 5 mg by mouth daily.    . citalopram (CELEXA) 20 MG tablet Take 20 mg by mouth daily.    . diclofenac (VOLTAREN) 75 MG EC tablet TAKE  (1)  TABLET TWICE A DAY WITH MEALS (BREAKFAST AND SUPPER) 60 tablet 2  . divalproex (DEPAKOTE ER) 500 MG 24 hr tablet Take 1,000-1,500 mg by mouth 2 (two) times daily. 2 tablets in the morning and 3 tablets at bedtime.    . gabapentin (NEURONTIN) 300 MG capsule Take 600 mg by mouth 3 (three) times daily.     . insulin NPH-regular (NOVOLIN 70/30) (70-30) 100 UNIT/ML injection Inject 50 Units into the skin 3 (three) times daily.     Marland Kitchen lisinopril (PRINIVIL,ZESTRIL) 5 MG tablet Take 5 mg by mouth daily.    Marland Kitchen oxybutynin (DITROPAN) 5 MG tablet Take 5 mg by mouth 2 (two) times daily.    . risperiDONE (RISPERDAL) 0.25 MG tablet Take 0.25 mg by mouth at bedtime.    . traMADol (ULTRAM) 50 MG tablet Take 1 tablet (50 mg total) by mouth every 6 (six) hours as needed. 60 tablet 5  .  traZODone (DESYREL) 100 MG tablet Take 200 mg by mouth at bedtime.      No current facility-administered medications for this visit.     PAST MEDICAL HISTORY: Past Medical History:  Diagnosis Date  . Anxiety   . Asthma   . Bipolar 1 disorder ([place])   . Bulging lumbar disc   . Chronic pain of left knee   . COPD (chronic obstructive pulmonary disease) ([place])   . Diabetes mellitus   . Neuropathy     PAST SURGICAL HISTORY: Past Surgical History:  Procedure Laterality Date  . ABDOMINAL HYSTERECTOMY    . CARPAL TUNNEL RELEASE Right    . KNEE ARTHROSCOPY WITH MEDIAL MENISECTOMY Right 04/14/2016   Procedure: KNEE ARTHROSCOPY WITH MEDIAL AND LATERAL MENISECTOMY, MICROFRACTURE REPAIR;  Surgeon: Carole Civil, MD;  Location: [facility];  Service: Orthopedics;  Laterality: Right;  lateral menisectomy - needs crutch training    FAMILY HISTORY: Family History  Problem Relation Age of Onset  . Diabetes Mother   . Heart attack Father     SOCIAL HISTORY:  Social History   Socioeconomic History  . Marital status: Single    Spouse name: Not on file  . Number of children: 0  . Years of education: 43  . Highest education level: High school graduate  Social Needs  . Financial resource strain: Not on file  . Food insecurity - worry: Not on file  . Food insecurity - inability: Not on file  . Transportation needs - medical: Not on file  . Transportation needs - non-medical: Not on file  Occupational History  . Occupation: Unemployed  Tobacco Use  . Smoking status: Current Some Day Smoker    Packs/day: 0.25    Types: Cigarettes  . Smokeless tobacco: Never Used  . Tobacco comment: trying to cut down  Substance and Sexual Activity  . Alcohol use: No  . Drug use: No  . Sexual activity: No  Other Topics Concern  . Not on file  Social History Narrative   Lives at home alone.   Right-handed.   No caffeine use.     PHYSICAL EXAM   Vitals:   08/29/17 1006  BP: 113/67  Pulse: 76  Weight: 293 lb (132.9 kg)  Height: 6' (1.829 m)    Not recorded      Body mass index is 39.74 kg/m.  PHYSICAL EXAMNIATION:  Gen: NAD, conversant, well nourised, obese, well groomed                     Cardiovascular: Regular rate rhythm, no peripheral edema, warm, nontender. Eyes: Conjunctivae clear without exudates or hemorrhage Neck: Supple, no carotid bruits. Pulmonary: Clear to auscultation bilaterally   NEUROLOGICAL EXAM:  MENTAL STATUS: Speech:    Speech is normal; fluent and spontaneous with normal comprehension.    Cognition:     Orientation to time, place and person     Normal recent and remote memory     Normal Attention span and concentration     Normal Language, naming, repeating,spontaneous speech     Fund of knowledge   CRANIAL NERVES: CN II: Visual fields are full to confrontation. Fundoscopic exam is normal with sharp discs and no vascular changes. Pupils are round equal and briskly reactive to light. CN III, IV, VI: extraocular movement are normal. No ptosis. CN V: Facial sensation is intact to pinprick in all 3 divisions bilaterally. Corneal responses are intact.  CN VII: Face is symmetric with normal eye  closure and smile. CN VIII: Hearing is normal to rubbing fingers CN IX, X: Palate elevates symmetrically. Phonation is normal. CN XI: Head turning and shoulder shrug are intact CN XII: Tongue is midline with normal movements and no atrophy.  MOTOR: There is no pronator drift of out-stretched arms. Muscle bulk and tone are normal. Muscle strength is normal.  REFLEXES: Reflexes are hypoactive and symmetric at the biceps, triceps, knees, and ankles. Plantar responses are flexor.  SENSORY: Intact to light touch, pinprick,and vibratory sensation are intact in fingers and toes.  COORDINATION: Rapid alternating movements and fine finger movements are intact. There is no dysmetria on finger-to-nose and heel-knee-shin.    GAIT/STANCE: Need to push up to get up from seated position, wide-based, unsteady Romberg is absent.   DIAGNOSTIC DATA (LABS, IMAGING, TESTING) - I reviewed patient records, labs, notes, testing and imaging myself where available.   ASSESSMENT AND PLAN  Diva Lemberger is a 62 y.o. female   Paresthesia from neck down, chronic neck, low back pain, gait abnormality, urinary incontinence,  Need to rule out cervical spondylitic myelopathy, cervical lumbar radiculopathy  Proceed with MRI of cervical spine, lumbar spine  Continue physical therapy, optimize depression  treatment   Marcial Pacas, M.D. Ph.D.  [facility] [address] [place], [place] [address] Ph: [phone] Fax: [phone]  CC: Carole Civil, MD, Health, [facility]

## 2017-08-30 ENCOUNTER — Encounter: Payer: Self-pay | Admitting: Physical Therapy

## 2017-09-04 ENCOUNTER — Ambulatory Visit: Payer: No Typology Code available for payment source | Admitting: Physical Therapy

## 2017-09-04 ENCOUNTER — Encounter: Payer: Self-pay | Admitting: Physical Therapy

## 2017-09-04 DIAGNOSIS — M6281 Muscle weakness (generalized): Secondary | ICD-10-CM

## 2017-09-04 DIAGNOSIS — M25562 Pain in left knee: Principal | ICD-10-CM

## 2017-09-04 DIAGNOSIS — G8929 Other chronic pain: Secondary | ICD-10-CM

## 2017-09-04 NOTE — Therapy (Addendum)
[facility] [place], [place], [address] Phone: [phone]   Fax:  [phone]  Physical Therapy Treatment  Patient Details  Name: Rhonda Hickman MRN: [id number] Date of Birth: 1955-11-10 No Data Recorded  Encounter Date: 09/04/2017  PT End of Session - 09/04/17 8979    Visit Number  7    Number of Visits  8    Date for PT Re-Evaluation  09/05/17    Authorization Time Period  04/22/16 to 06/17/16    PT Start Time  0816    PT Stop Time  0830    PT Time Calculation (min)  14 min    Activity Tolerance  Patient limited by pain    Behavior During Therapy  Restless       Past Medical History:  Diagnosis Date  . Anxiety   . Asthma   . Bipolar 1 disorder ([place])   . Bulging lumbar disc   . Chronic pain of left knee   . COPD (chronic obstructive pulmonary disease) ([place])   . Diabetes mellitus   . Neuropathy     Past Surgical History:  Procedure Laterality Date  . ABDOMINAL HYSTERECTOMY    . CARPAL TUNNEL RELEASE Right   . KNEE ARTHROSCOPY WITH MEDIAL MENISECTOMY Right 04/14/2016   Procedure: KNEE ARTHROSCOPY WITH MEDIAL AND LATERAL MENISECTOMY, MICROFRACTURE REPAIR;  Surgeon: Carole Civil, MD;  Location: [facility];  Service: Orthopedics;  Laterality: Right;  lateral menisectomy - needs crutch training    There were no vitals filed for this visit.  Subjective Assessment - 09/04/17 0818    Subjective  Patient arrived with reported 10/10 pain and no improvement    Pertinent History  asthma, anxiety, COPD, DM, bipolar disorder, arthritis, right knee arthroscopic surgery.    Limitations  Walking    How long can you sit comfortably?  unlimited     How long can you walk comfortably?  with walker, unlimited; without walker only able to walk for several minutes     Patient Stated Goals  Have surgery if physical therapy doesn't help    Currently in Pain?  Yes    Pain Score  10-Worst pain ever    Pain Location  Knee    Pain  Orientation  Left    Pain Descriptors / Indicators  Discomfort    Pain Type  Surgical pain    Pain Onset  More than a month ago    Pain Frequency  Constant    Aggravating Factors   any increased activity using left knee    Pain Relieving Factors  ice and rest                      [facility] Adult PT Treatment/Exercise - 09/04/17 0001      Knee/Hip Exercises: Aerobic   Nustep  L1 with UE/LE activity x 9 min      Knee/Hip Exercises: Supine   Hip Adduction Isometric  Strengthening;Both;20 reps;Limitations    Hip Adduction Isometric Limitations  grey ball for resistance                  PT Long Term Goals - 09/04/17 1504      PT LONG TERM GOAL #1   Title  Pt will demo improved BLE strength to 5/5 MMT to improve her safety with functional tasks.    Time  4    Period  Weeks    Status  On-going  PT LONG TERM GOAL #2   Title  Independent with a HEP.    Time  4    Period  Weeks    Status  Achieved      PT LONG TERM GOAL #3   Title  Walk in clinic 500 feet without gait antalgia.    Time  4    Period  Weeks    Status  On-going      PT LONG TERM GOAL #4   Title  Perform a reciprocating stair gait with one railing with pain not > 3/10.    Time  4    Period  Weeks    Status  On-going      PT LONG TERM GOAL #5   Title  Pt will report no more than 3/10 pain with daily activity to improve her comfort and tolerance with ADLs.     Time  8    Period  Weeks    Status  On-going            Plan - 09/04/17 0086    Clinical Impression Statement  Patient ended treatment early due to reported discomfort. Patient unable to complete low level exercises today. Patint unable to meet any current goals. Patient has F/U with MD on wednesday.     Rehab Potential  Good    PT Frequency  2x / week    PT Duration  4 weeks    PT Treatment/Interventions  ADLs/Self Care Home Management;Cryotherapy;Electrical Stimulation;Ultrasound;Therapeutic activities;Therapeutic  exercise;Patient/family education    PT Next Visit Plan  To MD, note sent    Consulted and Agree with Plan of Care  Patient       Patient will benefit from skilled therapeutic intervention in order to improve the following deficits and impairments:  Abnormal gait, Decreased activity tolerance, Decreased strength, Pain  Visit Diagnosis: Chronic pain of left knee  Muscle weakness (generalized)     Problem List Patient Active Problem List   Diagnosis Date Noted  . Gait abnormality 08/29/2017  . Urinary incontinence 08/29/2017  . Paresthesia 08/29/2017  . Derangement of posterior horn of medial meniscus of right knee   . Meniscus, lateral, derangement, right   . Arthritis of knee, right   . COPD (chronic obstructive pulmonary disease) ([place]) 03/09/2016  . DIABETES MELLITUS, TYPE II 02/16/2007  . ABUSE, COCAINE, UNSPECIFIED 02/16/2007  . ASTHMA, EXTRINSIC NOS 02/16/2007  . HOMELESSNESS, HX OF 02/16/2007   Ladean Raya, PTA 09/04/17 8:43 AM  [facility] [place], [place], [address] Phone: [phone]   Fax:  [phone]  Name: Rhonda Hickman MRN: [id number] Date of Birth: [birth year]  PHYSICAL THERAPY DISCHARGE SUMMARY  Visits from Start of Care: 7.  Current functional level related to goals / functional outcomes: See above.   Remaining deficits: Continued left knee pain.   Education / Equipment: HEP. Plan: Patient agrees to discharge.  Patient goals were partially met. Patient is being discharged due to lack of progress.  ?????         Mali Applegate MPT

## 2017-09-05 ENCOUNTER — Ambulatory Visit (HOSPITAL_COMMUNITY)
Admission: RE | Admit: 2017-09-05 | Discharge: 2017-09-05 | Disposition: A | Discharge status: Home / Self Care | Payer: No Typology Code available for payment source | Source: Ambulatory Visit | Attending: Neurology | Admitting: Neurology

## 2017-09-05 ENCOUNTER — Ambulatory Visit (HOSPITAL_COMMUNITY): Payer: No Typology Code available for payment source

## 2017-09-05 ENCOUNTER — Telehealth: Payer: Self-pay | Admitting: Neurology

## 2017-09-05 DIAGNOSIS — R269 Unspecified abnormalities of gait and mobility: Secondary | ICD-10-CM

## 2017-09-05 DIAGNOSIS — M1288 Other specific arthropathies, not elsewhere classified, other specified site: Secondary | ICD-10-CM | POA: Insufficient documentation

## 2017-09-05 DIAGNOSIS — G119 Hereditary ataxia, unspecified: Secondary | ICD-10-CM | POA: Insufficient documentation

## 2017-09-05 DIAGNOSIS — M48061 Spinal stenosis, lumbar region without neurogenic claudication: Secondary | ICD-10-CM | POA: Insufficient documentation

## 2017-09-05 DIAGNOSIS — M5127 Other intervertebral disc displacement, lumbosacral region: Secondary | ICD-10-CM | POA: Insufficient documentation

## 2017-09-05 DIAGNOSIS — M549 Dorsalgia, unspecified: Secondary | ICD-10-CM

## 2017-09-05 DIAGNOSIS — M50223 Other cervical disc displacement at C6-C7 level: Secondary | ICD-10-CM | POA: Insufficient documentation

## 2017-09-05 DIAGNOSIS — G3281 Cerebellar ataxia in diseases classified elsewhere: Secondary | ICD-10-CM

## 2017-09-05 DIAGNOSIS — M542 Cervicalgia: Secondary | ICD-10-CM

## 2017-09-05 NOTE — Telephone Encounter (Signed)
MRI cervical spine showed mild degenerative disease, no evidence of canal stenosis.  MRI lumbar showed multiple level degenerative disease, with mild foraminal stenosis at different levels.   IMPRESSION: 1. At C6-7 there is a broad central disc protrusion effacing the ventral CSF space.  IMPRESSION: 1. At L4-5 there is a mild broad-based disc bulge. Severe bilateral facet arthropathy. Mild bilateral foraminal stenosis. 2. At L5-S1 there is a broad-based disc bulge with a small central disc protrusion. Mild bilateral facet arthropathy. Mild bilateral foraminal stenosis.

## 2017-09-05 NOTE — Telephone Encounter (Signed)
Spoke to patient - she is aware of results.  She was doing PT at [facility] in [place], [place].  States her last visit was Monday, 09/04/17.  She will need a new order to continue (back pain, neck pain, gait abnormality).

## 2017-09-06 ENCOUNTER — Ambulatory Visit (INDEPENDENT_AMBULATORY_CARE_PROVIDER_SITE_OTHER): Payer: No Typology Code available for payment source | Admitting: Orthopedic Surgery

## 2017-09-06 ENCOUNTER — Encounter: Payer: Self-pay | Admitting: Orthopedic Surgery

## 2017-09-06 DIAGNOSIS — M25561 Pain in right knee: Secondary | ICD-10-CM

## 2017-09-06 DIAGNOSIS — G8929 Other chronic pain: Secondary | ICD-10-CM

## 2017-09-06 MED ORDER — TRAMADOL HCL 50 MG PO TABS: 50.0000 mg | ORAL_TABLET | Freq: Four times a day (QID) | ORAL | 5 refills | Status: DC | PRN

## 2017-09-06 NOTE — Progress Notes (Signed)
Progress Note   Patient ID: Rhonda Hickman, female   DOB: 1955-07-20, 62 y.o.   MRN: [id number]  Chief Complaint  Patient presents with  . Knee Pain    left    62 year old female had an MRI back in January left knee she has arthritis no meniscal tears no ligament damage.  She also had an MRI of her lumbar spine which shows significant disc disease L4 through S1.  She complains of pain despite being on tramadol and wearing a knee brace continuing in the left knee.  She is here for routine follow-up     Review of Systems  Neurological: Positive for sensory change.   Current Meds  Medication Sig  . albuterol (PROVENTIL HFA;VENTOLIN HFA) 108 (90 Base) MCG/ACT inhaler Inhale 2 puffs into the lungs every 4 (four) hours as needed.  Marland Kitchen albuterol (PROVENTIL) (2.5 MG/3ML) 0.083% nebulizer solution Take 2.5 mg by nebulization every 6 (six) hours as needed for shortness of breath.   Marland Kitchen amLODipine (NORVASC) 5 MG tablet Take 5 mg by mouth daily.  . citalopram (CELEXA) 20 MG tablet Take 20 mg by mouth daily.  . diclofenac (VOLTAREN) 75 MG EC tablet TAKE  (1)  TABLET TWICE A DAY WITH MEALS (BREAKFAST AND SUPPER)  . divalproex (DEPAKOTE ER) 500 MG 24 hr tablet Take 1,000-1,500 mg by mouth 2 (two) times daily. 2 tablets in the morning and 3 tablets at bedtime.  . gabapentin (NEURONTIN) 300 MG capsule Take 3 capsules (900 mg total) by mouth 3 (three) times daily.  . insulin NPH-regular (NOVOLIN 70/30) (70-30) 100 UNIT/ML injection Inject 50 Units into the skin 3 (three) times daily.   Marland Kitchen lisinopril (PRINIVIL,ZESTRIL) 5 MG tablet Take 5 mg by mouth daily.  Marland Kitchen oxybutynin (DITROPAN) 5 MG tablet Take 5 mg by mouth 2 (two) times daily.  . risperiDONE (RISPERDAL) 0.25 MG tablet Take 0.25 mg by mouth at bedtime.  . traMADol (ULTRAM) 50 MG tablet Take 1 tablet (50 mg total) by mouth every 6 (six) hours as needed.  . traZODone (DESYREL) 100 MG tablet Take 200 mg by mouth at bedtime.   . [DISCONTINUED] albuterol  (PROVENTIL HFA;VENTOLIN HFA) 108 (90 BASE) MCG/ACT inhaler Inhale 2 puffs into the lungs every 6 (six) hours as needed for wheezing.  . [DISCONTINUED] traMADol (ULTRAM) 50 MG tablet Take 1 tablet (50 mg total) by mouth every 6 (six) hours as needed.    Allergies  Allergen Reactions  . Aspirin Nausea Only and Other (See Comments)    Causes stomach pain  . Ibuprofen     Stomach problems     BP 107/80   Pulse 61   Ht 6' (1.829 m)   Wt 289 lb (131.1 kg)   BMI 39.20 kg/m   Physical Exam   Medical decision-making Encounter Diagnosis  Name Primary?  . Chronic pain of left knee    MRI left knee The report was read as follows  IMPRESSION: 1. Slight progression arthritis of the patellofemoral compartment. 2. Interval partial healing of cartilage defects in the medial and lateral compartments. 3. Decreased now small joint effusion. 4. Chronic intrinsic degeneration of the posterior horns of the medial and lateral menisci without discrete tears, unchanged.     Electronically Signed   By: Lorriane Shire M.D.   On: 07/07/2017 10:27   IMPRESSION: MRI cervical 1. At C6-7 there is a broad central disc protrusion effacing the ventral CSF space.   IMPRESSION: MRI lumbar 1. At L4-5 there is a mild  broad-based disc bulge. Severe bilateral facet arthropathy. Mild bilateral foraminal stenosis. 2. At L5-S1 there is a broad-based disc bulge with a small central disc protrusion. Mild bilateral facet arthropathy. Mild bilateral foraminal stenosis.     Meds ordered this encounter  Medications  . traMADol (ULTRAM) 50 MG tablet    Sig: Take 1 tablet (50 mg total) by mouth every 6 (six) hours as needed.    Dispense:  60 tablet    Refill:  5    I discussed this with her and recommended injection the knee and back but she is terrified of needles and has opted to go with tramadol and continued bracing  We did not schedule follow-up as there is nothing we can do except for inject the  knee Arther Abbott, MD 09/06/2017 9:57 AM

## 2017-09-06 NOTE — Telephone Encounter (Signed)
Ok, per vo by Dr. Krista Blue, to reorder PT for patient.  Order placed in [place].

## 2017-09-06 NOTE — Addendum Note (Signed)
Addended by: Desmond Lope on: 09/06/2017 08:32 AM   Modules accepted: Orders

## 2017-09-20 ENCOUNTER — Ambulatory Visit: Payer: No Typology Code available for payment source | Admitting: Physical Therapy

## 2017-10-18 ENCOUNTER — Other Ambulatory Visit: Payer: Self-pay | Admitting: Orthopedic Surgery

## 2017-10-18 DIAGNOSIS — M1712 Unilateral primary osteoarthritis, left knee: Secondary | ICD-10-CM

## 2017-12-11 ENCOUNTER — Other Ambulatory Visit: Payer: Self-pay | Admitting: Orthopedic Surgery

## 2018-01-10 ENCOUNTER — Telehealth: Payer: Self-pay | Admitting: Neurology

## 2018-01-10 MED ORDER — GABAPENTIN 300 MG PO CAPS: 900.0000 mg | ORAL_CAPSULE | Freq: Three times a day (TID) | ORAL | 11 refills | Status: DC

## 2018-01-10 NOTE — Addendum Note (Signed)
Addended by: Noberto Retort C on: 01/10/2018 11:53 AM   Modules accepted: Orders

## 2018-01-10 NOTE — Telephone Encounter (Signed)
Prescription sent to [facility] per patient's request.  Returned call to patient to make her aware this has been completed for her.

## 2018-01-10 NOTE — Telephone Encounter (Signed)
Pt has called asking if her Rx for gabapentin (NEURONTIN) 300 MG capsule can be sent to [facility] phone [phone] or local phone# [phone] pt is asking for a call back

## 2018-01-17 ENCOUNTER — Emergency Department (HOSPITAL_COMMUNITY): Payer: Self-pay

## 2018-01-17 ENCOUNTER — Encounter (HOSPITAL_COMMUNITY): Payer: Self-pay | Admitting: Emergency Medicine

## 2018-01-17 ENCOUNTER — Other Ambulatory Visit: Payer: Self-pay

## 2018-01-17 ENCOUNTER — Emergency Department (HOSPITAL_COMMUNITY)
Admission: EM | Admit: 2018-01-17 | Discharge: 2018-01-17 | Disposition: A | Discharge status: Home / Self Care | Payer: Self-pay | Attending: Emergency Medicine | Admitting: Emergency Medicine

## 2018-01-17 DIAGNOSIS — F419 Anxiety disorder, unspecified: Secondary | ICD-10-CM | POA: Insufficient documentation

## 2018-01-17 DIAGNOSIS — R51 Headache: Secondary | ICD-10-CM | POA: Insufficient documentation

## 2018-01-17 DIAGNOSIS — F319 Bipolar disorder, unspecified: Secondary | ICD-10-CM | POA: Insufficient documentation

## 2018-01-17 DIAGNOSIS — F17210 Nicotine dependence, cigarettes, uncomplicated: Secondary | ICD-10-CM | POA: Insufficient documentation

## 2018-01-17 DIAGNOSIS — Z794 Long term (current) use of insulin: Secondary | ICD-10-CM | POA: Insufficient documentation

## 2018-01-17 DIAGNOSIS — E119 Type 2 diabetes mellitus without complications: Secondary | ICD-10-CM | POA: Insufficient documentation

## 2018-01-17 DIAGNOSIS — Z79899 Other long term (current) drug therapy: Secondary | ICD-10-CM | POA: Insufficient documentation

## 2018-01-17 DIAGNOSIS — R519 Headache, unspecified: Secondary | ICD-10-CM

## 2018-01-17 LAB — BASIC METABOLIC PANEL
ANION GAP: 7 (ref 5–15)
BUN: 11 mg/dL (ref 8–23)
CHLORIDE: 105 mmol/L (ref 98–111)
CO2: 26 mmol/L (ref 22–32)
Calcium: 8.8 mg/dL — ABNORMAL LOW (ref 8.9–10.3)
Creatinine, Ser: 0.95 mg/dL (ref 0.44–1.00)
GFR calc Af Amer: >60 mL/min (ref >60)
GFR calc non Af Amer: >60 mL/min (ref >60)
GLUCOSE: 169 mg/dL — AB (ref 70–99)
POTASSIUM: 4.3 mmol/L (ref 3.5–5.1)
SODIUM: 138 mmol/L (ref 135–145)

## 2018-01-17 LAB — CBC
HCT: 40.1 % (ref 36.0–46.0)
Hemoglobin: 13.1 g/dL (ref 12.0–15.0)
MCH: 29.2 pg (ref 26.0–34.0)
MCHC: 32.7 g/dL (ref 30.0–36.0)
MCV: 89.3 fL (ref 78.0–100.0)
Platelets: 182 10*3/uL (ref 150–400)
RBC: 4.49 MIL/uL (ref 3.87–5.11)
RDW: 14.6 % (ref 11.5–15.5)
WBC: 8.3 10*3/uL (ref 4.0–10.5)

## 2018-01-17 LAB — VALPROIC ACID LEVEL: VALPROIC ACID LVL: 27 ug/mL — AB (ref 50.0–100.0)

## 2018-01-17 LAB — CBG MONITORING, ED: Glucose-Capillary: 181 mg/dL — ABNORMAL HIGH (ref 70–99)

## 2018-01-17 MED ORDER — SODIUM CHLORIDE 0.9 % IV BOLUS
500.0000 mL | Freq: Once | INTRAVENOUS | Status: AC
  Administered 2018-01-17: 500 mL via INTRAVENOUS

## 2018-01-17 MED ORDER — DIPHENHYDRAMINE HCL 50 MG/ML IJ SOLN
25.0000 mg | Freq: Once | INTRAMUSCULAR | Status: AC
  Administered 2018-01-17: 25 mg via INTRAVENOUS
  Filled 2018-01-17: qty 1

## 2018-01-17 MED ORDER — METOCLOPRAMIDE HCL 5 MG/ML IJ SOLN
10.0000 mg | Freq: Once | INTRAMUSCULAR | Status: AC
  Administered 2018-01-17: 10 mg via INTRAVENOUS
  Filled 2018-01-17: qty 2

## 2018-01-17 NOTE — ED Provider Notes (Signed)
[facility] EMERGENCY DEPARTMENT Provider Note   CSN: [id number] Arrival date & time: 01/17/18  Page     History   Chief Complaint Chief Complaint  Patient presents with  . Headache    possible stroke    HPI Nevena Rozenberg is a 62 y.o. female.  HPI  63 year old female, history of bipolar disorder, chronic pain in her knee, COPD and diabetes, she also has a history of neuropathy.   the patient was in her usual state of health until last night when she stated that she felt an acute pop in the right side of her head followed by a severe headache which is been present since that time.  She tells me that she lives in [place], she got a ride here from a friend, she lives by herself.  It is difficult to obtain history as the patient has a waxing and waning level of alertness.  The patient was placed in a wheelchair on arrival because she was appearing somnolent, unsteady on her feet, tilting to the right side.  She denies visual changes, denies chest pain belly pain or leg pain at this time.  She states that she has a headache.  She cannot describe the headache.  She states that she usually does not have headaches.  Symptoms are severe persistent and got worse today.  She denies fevers or vomiting, denies diarrhea.  Past Medical History:  Diagnosis Date  . Anxiety   . Asthma   . Bipolar 1 disorder ([place])   . Bulging lumbar disc   . Chronic pain of left knee   . COPD (chronic obstructive pulmonary disease) ([place])   . Diabetes mellitus   . Neuropathy     Patient Active Problem List   Diagnosis Date Noted  . Gait abnormality 08/29/2017  . Urinary incontinence 08/29/2017  . Paresthesia 08/29/2017  . Derangement of posterior horn of medial meniscus of right knee   . Meniscus, lateral, derangement, right   . Arthritis of knee, right   . COPD (chronic obstructive pulmonary disease) ([place]) 03/09/2016  . DIABETES MELLITUS, TYPE II 02/16/2007  . ABUSE, COCAINE, UNSPECIFIED 02/16/2007    . ASTHMA, EXTRINSIC NOS 02/16/2007  . HOMELESSNESS, HX OF 02/16/2007    Past Surgical History:  Procedure Laterality Date  . ABDOMINAL HYSTERECTOMY    . CARPAL TUNNEL RELEASE Right   . KNEE ARTHROSCOPY WITH MEDIAL MENISECTOMY Right 04/14/2016   Procedure: KNEE ARTHROSCOPY WITH MEDIAL AND LATERAL MENISECTOMY, MICROFRACTURE REPAIR;  Surgeon: Carole Civil, MD;  Location: [facility];  Service: Orthopedics;  Laterality: Right;  lateral menisectomy - needs crutch training     OB History   None      Home Medications    Prior to Admission medications   Medication Sig Start Date End Date Taking? Authorizing Provider  albuterol (PROVENTIL HFA;VENTOLIN HFA) 108 (90 Base) MCG/ACT inhaler Inhale 2 puffs into the lungs every 4 (four) hours as needed. 08/20/17   Rolland Porter, MD  albuterol (PROVENTIL) (2.5 MG/3ML) 0.083% nebulizer solution Take 2.5 mg by nebulization every 6 (six) hours as needed for shortness of breath.     [provider]  amLODipine (NORVASC) 5 MG tablet Take 5 mg by mouth daily.    [provider]  citalopram (CELEXA) 20 MG tablet Take 20 mg by mouth daily.    [provider]  diclofenac (VOLTAREN) 75 MG EC tablet TAKE (1) TABLET TWICE A DAY WITH MEALS (BREAKFAST AND SUPPER) 10/18/17   Carole Civil,  MD  diclofenac (VOLTAREN) 75 MG EC tablet TAKE (1) TABLET TWICE A DAY WITH MEALS (BREAKFAST AND SUPPER) 12/12/17   Carole Civil, MD  divalproex (DEPAKOTE ER) 500 MG 24 hr tablet Take 1,000-1,500 mg by mouth 2 (two) times daily. 2 tablets in the morning and 3 tablets at bedtime.    [provider]  gabapentin (NEURONTIN) 300 MG capsule Take 3 capsules (900 mg total) by mouth 3 (three) times daily. 01/10/18   Marcial Pacas, MD  insulin NPH-regular (NOVOLIN 70/30) (70-30) 100 UNIT/ML injection Inject 50 Units into the skin 3 (three) times daily.     [provider]  lisinopril (PRINIVIL,ZESTRIL) 5 MG tablet Take 5 mg by mouth daily.     [provider]  oxybutynin (DITROPAN) 5 MG tablet Take 5 mg by mouth 2 (two) times daily.    [provider]  risperiDONE (RISPERDAL) 0.25 MG tablet Take 0.25 mg by mouth at bedtime.    [provider]  traMADol (ULTRAM) 50 MG tablet Take 1 tablet (50 mg total) by mouth every 6 (six) hours as needed. 09/06/17   Carole Civil, MD  traZODone (DESYREL) 100 MG tablet Take 200 mg by mouth at bedtime.     [provider]    Family History Family History  Problem Relation Age of Onset  . Diabetes Mother   . Heart attack Father     Social History Social History   Tobacco Use  . Smoking status: Current Some Day Smoker    Packs/day: 0.25    Types: Cigarettes  . Smokeless tobacco: Never Used  . Tobacco comment: trying to cut down  Substance Use Topics  . Alcohol use: No  . Drug use: No     Allergies   Aspirin and Ibuprofen   Review of Systems Review of Systems  Unable to perform ROS: Mental status change     Physical Exam Updated Vital Signs BP (!) 150/72   Pulse 68   Resp 17   SpO2 96%   Physical Exam  Constitutional: She appears well-developed and well-nourished. She appears distressed.  Confused, somnolent  HENT:  Head: Normocephalic and atraumatic.  Mouth/Throat: Oropharynx is clear and moist. No oropharyngeal exudate.  Eyes: Pupils are equal, round, and reactive to light. Conjunctivae and EOM are normal. Right eye exhibits no discharge. Left eye exhibits no discharge. No scleral icterus.  Neck: Normal range of motion. Neck supple. No JVD present. No thyromegaly present.  Cardiovascular: Normal rate, regular rhythm, normal heart sounds and intact distal pulses. Exam reveals no gallop and no friction rub.  No murmur heard. Pulmonary/Chest: Effort normal and breath sounds normal. No respiratory distress. She has no wheezes. She has no rales.  Abdominal: Soft. Bowel sounds are normal. She exhibits no distension and no mass.  There is no tenderness.  Musculoskeletal: Normal range of motion. She exhibits no edema or tenderness.  Lymphadenopathy:    She has no cervical adenopathy.  Neurological: Coordination normal.  The patient has an abnormal exam in that she is very weak.  She has difficulty getting out of the wheelchair but when forced she is able to stand and take a step forward.  Her head will flop from side to side, it is not particular to one side or the other.  She is able to lift both arms in the air for 5 seconds, it seems effort dependent as they drop and she is able to pick them back up.  Same with the legs.  She is able to follow commands including plantar and dorsiflexion of the feet at the ankles, she is able to bend both of her knees, she is not lateralizing.  Her speech is somewhat slurred unless she is vigorously stimulated at which point she speaks clearly.  Skin: Skin is warm and dry. No rash noted. No erythema.  Psychiatric: She has a normal mood and affect. Her behavior is normal.  Nursing note and vitals reviewed.    ED Treatments / Results  Labs (all labs ordered are listed, but only abnormal results are displayed) Labs Reviewed  BASIC METABOLIC PANEL - Abnormal; Notable for the following components:      Result Value   Glucose, Bld 169 (*)    Calcium 8.8 (*)    All other components within normal limits  VALPROIC ACID LEVEL - Abnormal; Notable for the following components:   Valproic Acid Lvl 27 (*)    All other components within normal limits  CBG MONITORING, ED - Abnormal; Notable for the following components:   Glucose-Capillary 181 (*)    All other components within normal limits  CBC    EKG EKG Interpretation  Date/Time:  Wednesday January 17 2018 19:07:49 EDT Ventricular Rate:  66 PR Interval:    QRS Duration: 74 QT Interval:  411 QTC Calculation: 431 R Axis:   60 Text Interpretation:  Sinus rhythm Borderline T abnormalities, inferior leads since last tracing no  significant change Confirmed by Noemi Chapel [phone]) on 01/17/2018 7:11:30 PM   Radiology Ct Head Wo Contrast  Result Date: 01/17/2018 CLINICAL DATA:  Popping sensation along the right side of the head last night. Subsequent dizziness and fatigue. Right-sided pain. EXAM: CT HEAD WITHOUT CONTRAST TECHNIQUE: Contiguous axial images were obtained from the base of the skull through the vertex without intravenous contrast. COMPARISON:  02/07/2012. FINDINGS: Brain: No evidence of an acute infarct, acute hemorrhage, mass lesion, mass effect or hydrocephalus. White matter hypodensities are again seen. Vascular: No hyperdense vessel or unexpected calcification. Skull: Normal. Negative for fracture or focal lesion. Sinuses/Orbits: No acute finding. Other: None. IMPRESSION: 1. No acute intracranial abnormality. 2. Chronic microvascular white matter ischemic disease. Electronically Signed   By: Lorin Picket M.D.   On: 01/17/2018 20:03    Procedures Procedures (including critical care time)  Medications Ordered in ED Medications  sodium chloride 0.9 % bolus 500 mL (0 mLs Intravenous Stopped 01/17/18 2111)  metoCLOPramide (REGLAN) injection 10 mg (10 mg Intravenous Given 01/17/18 2041)  diphenhydrAMINE (BENADRYL) injection 25 mg (25 mg Intravenous Given 01/17/18 2041)     Initial Impression / Assessment and Plan / ED Course  I have reviewed the triage vital signs and the nursing notes.  Pertinent labs & imaging results that were available during my care of the patient were reviewed by me and considered in my medical decision making (see chart for details).    The patient has an abnormal exam, the history is suggestive that there could be subarachnoid hemorrhage however her exam is nonfocal and this does not appear to be consistent with a stroke.  Will obtain a stat CT scan of the head without contrast as well as some lab work.  She will be reevaluated.  She will get some pain medication but not until the  CT scan is obtained.  CT negative - pt decliens further w/u for [facility] -  Ambulatory without headache Can f/u outpatient Pt agreeable.  Final Clinical Impressions(s) / ED Diagnoses   Final diagnoses:  Nonintractable headache,  unspecified chronicity pattern, unspecified headache type    ED Discharge Orders    None       Noemi Chapel, MD 01/17/18 2145

## 2018-01-17 NOTE — ED Notes (Signed)
Pt resting with eyes closed, appears to be in no distress. Respirations are even and unlabored.  

## 2018-01-17 NOTE — ED Notes (Signed)
Pt more alert at this time, ambulated to restroom with assistance X1. States that she has periods of dizziness and increased confusion and weakness and then she returns to baseline. MD Sabra Heck made aware.

## 2018-01-17 NOTE — ED Notes (Signed)
Pt ambulated to restroom with this RN at side. States no HA at this time and is ready to go home. MD made aware.

## 2018-01-17 NOTE — ED Triage Notes (Signed)
Pt states heard a pop to right side of head last night at 10pm. Pt states has been very dizzy and sleepy today. C/o right side pain. Pt groggy in triage. Pupils perrla.see NIH. Pt sways back and forth with eyes closed.

## 2018-01-17 NOTE — Discharge Instructions (Signed)
Please obtain all of your results from medical records or have your doctors office obtain the results - share them with your doctor - you should be seen at your doctors office in the next 2 days. Call today to arrange your follow up. Take the medications as prescribed. Please review all of the medicines and only take them if you do not have an allergy to them. Please be aware that if you are taking birth control pills, taking other prescriptions, ESPECIALLY ANTIBIOTICS may make the birth control ineffective - if this is the case, either do not engage in sexual activity or use alternative methods of birth control such as condoms until you have finished the medicine and your family doctor says it is OK to restart them. If you are on a blood thinner such as COUMADIN, be aware that any other medicine that you take may cause the coumadin to either work too much, or not enough - you should have your coumadin level rechecked in next 7 days if this is the case.  ?  It is also a possibility that you have an allergic reaction to any of the medicines that you have been prescribed - Everybody reacts differently to medications and while MOST people have no trouble with most medicines, you may have a reaction such as nausea, vomiting, rash, swelling, shortness of breath. If this is the case, please stop taking the medicine immediately and contact your physician.  ?  You should return to the ER if you develop severe or worsening symptoms.   Headache:  You are having a headache. No specific cause was found today for your headache. It may have been a migraine or other cause of headache. Stress, anxiety, fatigue, and depression are common triggers for headaches. Your headache today does not appear to be life-threatening or require hospitalization, but often the exact cause of headaches is not determined in the emergency department. Therefore, followup with your doctor is very important to find out what may have caused your  headache, and whether or not you need any further diagnostic testing or treatment. Sometimes headaches can appear benign but then more serious symptoms can develop which should prompt an immediate reevaluation by your doctor or the emergency department.  Seek immediate medical attention if:  You develop possible problems with medications prescribed. The medications don't resolve your headache, if it recurs, or if you have multiple episodes of vomiting or can't take fluids by mouth You have a change from the usual headache. If you developed a sudden severe headache or confusion, become poorly responsive or faint, developed a fever above 100.4 or problems breathing, have a change in speech, vision, swallowing or understanding, or developed new weakness, numbness, tingling, incoordination or have a seizure.  If you don't have a family doctor to follow up with, see the follow up list below - call this morning for a follow-up appointment in the next 1-2 days.  RESOURCE GUIDE  Dental Problems  Patients with Medicaid: [place] Phone:  [phone]  Phone:  [phone]  If unable to pay or uninsured, contact:  [facility] or [facility]. to become qualified for the adult dental clinic.  Chronic Pain Problems Contact [facility]  [phone] Patients need to be referred by their primary care doctor.  Insufficient Money for Medicine Contact [employer]:  call "211" or [place] [phone].  No Primary Care Doctor Call [facility]  [phone] Other agencies that provide inexpensive medical care    [place]  [phone]    [facility]  [place]  [phone]    [facility]  [phone]    [facility]  [place]  [place]  [phone] [place]   [phone] (emergency services [phone])  Substance Abuse [facility]  [phone] [facility] [phone] [place] [phone] Chinita Pester [phone] Residential & Outpatient Substance Abuse Program  [phone]  Abuse/Neglect [place] [phone] [place] [phone] (After Hours)  Emergency [place] [phone]  [place] at the [place] [phone] [place] [phone]  MRSA Hotline #:   539-451-6542    [facility] Dept. [address]      [place] Phone:  [phone]                                   Phone:  [phone]                 Phone:  [place] Phone:  [place] [phone] (After Hours)

## 2018-03-02 ENCOUNTER — Other Ambulatory Visit: Payer: Self-pay | Admitting: Orthopedic Surgery

## 2018-03-15 ENCOUNTER — Other Ambulatory Visit: Payer: Self-pay | Admitting: Orthopedic Surgery

## 2018-03-15 DIAGNOSIS — G8929 Other chronic pain: Secondary | ICD-10-CM

## 2018-03-15 DIAGNOSIS — M25561 Pain in right knee: Principal | ICD-10-CM

## 2018-05-28 ENCOUNTER — Ambulatory Visit (INDEPENDENT_AMBULATORY_CARE_PROVIDER_SITE_OTHER): Payer: Self-pay | Admitting: Orthopedic Surgery

## 2018-05-28 ENCOUNTER — Other Ambulatory Visit: Payer: Self-pay | Admitting: Orthopedic Surgery

## 2018-05-28 VITALS — BP 159/91 | HR 72 | Ht 72.0 in | Wt 302.0 lb

## 2018-05-28 DIAGNOSIS — Z9889 Other specified postprocedural states: Secondary | ICD-10-CM

## 2018-05-28 DIAGNOSIS — M25461 Effusion, right knee: Secondary | ICD-10-CM

## 2018-05-28 DIAGNOSIS — M25561 Pain in right knee: Principal | ICD-10-CM

## 2018-05-28 DIAGNOSIS — G8929 Other chronic pain: Secondary | ICD-10-CM

## 2018-05-28 DIAGNOSIS — M1712 Unilateral primary osteoarthritis, left knee: Secondary | ICD-10-CM

## 2018-05-28 DIAGNOSIS — M1710 Unilateral primary osteoarthritis, unspecified knee: Secondary | ICD-10-CM

## 2018-05-28 DIAGNOSIS — M5410 Radiculopathy, site unspecified: Secondary | ICD-10-CM

## 2018-05-28 MED ORDER — TRAMADOL HCL 50 MG PO TABS: ORAL_TABLET | ORAL | 0 refills | Status: DC

## 2018-05-28 MED ORDER — DICLOFENAC SODIUM 75 MG PO TBEC: 75.0000 mg | DELAYED_RELEASE_TABLET | Freq: Two times a day (BID) | ORAL | 5 refills | Status: DC

## 2018-05-28 NOTE — Progress Notes (Addendum)
Progress Note   Patient ID: Rhonda Hickman, female   DOB: August 23, 1955, 62 y.o.   MRN: [id number]   Chief Complaint  Patient presents with  . Knee Pain    Recheck on right knee.  . Back Pain    down right leg     HPI The patient presents for evaluation of ongoing problems with her right knee.  She was seen previously and worked up found to have disc disease told to have epidurals and also advised to have aspiration injection left knee after MRI showed no tear but arthritis she refused both  Presents back complaining of moderately severe dull aching right knee pain with giving way as well as lower back pain with radiation down into the left foot associated with numbness and tingling  Pain severe at times primarily moderate to mild   Review of Systems  Constitutional: Negative for chills, fever and weight loss.  Musculoskeletal: Positive for back pain, falls and joint pain.  Neurological: Positive for tingling and sensory change. Negative for tremors, speech change, focal weakness and weakness.   No outpatient medications have been marked as taking for the 05/28/18 encounter (Office Visit) with Carole Civil, MD.    Past Medical History:  Diagnosis Date  . Anxiety   . Asthma   . Bipolar 1 disorder ([place])   . Bulging lumbar disc   . Chronic pain of left knee   . COPD (chronic obstructive pulmonary disease) ([place])   . Diabetes mellitus   . Neuropathy      Allergies  Allergen Reactions  . Aspirin Nausea Only and Other (See Comments)    Causes stomach pain  . Ibuprofen     Stomach problems     BP (!) 159/91   Pulse 72   Ht 6' (1.829 m)   Wt (!) 302 lb (137 kg)   BMI 40.96 kg/m    Physical Exam General appearance normal Oriented x3 normal Mood pleasant affect normal Gait no significant issues seen on gait analysis  Ortho Exam Left knee and left lower extremity Inspection and palpation revealed no abnormalities Range of motion is full No instability was  detected on stress testing Muscle tone and strength was normal without tremor Skin was warm dry and intact Good pulse and temperature were noted in the extremity Sensation revealed no abnormalities to light touch   Right knee has effusion he is tender primarily medially flexion arc is 120 degrees no instability muscle tone is excellent no tremor skin looks good no erythema pulse and perfusion are normal     MEDICAL DECISION MAKING   Imaging:  No new imaging.  I pulled up her old imaging studies her MRI of her knee does not show any thing other than arthritis.  Her L-spine images show mild based disc bulge at L4-5 severe bilateral facet arthritis mild lateral foraminal stenosis and at L5-S1 broad-based disc bulge small disc protrusion centrally with mild bilateral facet arthritis mild bilateral foraminal stenosis   Encounter Diagnoses  Name Primary?  . S/P right knee arthroscopy   . Primary localized osteoarthritis of knee   . Effusion of right knee Yes  . Radicular pain of right lower extremity   . Chronic pain of right knee   . Primary osteoarthritis of left knee      PLAN: (RX., injection, surgery,frx,mri/ct, XR 2 body ares) Aspiration injection right knee 25 cc of clear yellow fluid  Patient advised to be compliant with treatment for epidurals L4-5 Procedure note  injection and aspiration right knee joint  Verbal consent was obtained to aspirate and inject the right knee joint   Timeout was completed to confirm the site of aspiration and injection  An 18-gauge needle was used to aspirate the knee joint from a suprapatellar lateral approach.  The medications used were 40 mg of Depo-Medrol and 1% lidocaine 3 cc  Anesthesia was provided by ethyl chloride and the skin was prepped with alcohol.  After cleaning the skin with alcohol an 18-gauge needle was used to aspirate the right knee joint.  We obtained 25  cc of fluid  We follow this by injection of 40 mg of  Depo-Medrol and 3 cc 1% lidocaine.  There were no complications. A sterile bandage was applied.   Follow-up PRN  Meds ordered this encounter  Medications  . traMADol (ULTRAM) 50 MG tablet    Sig: TAKE (1) TABLET EVERY SIX HOURS AS NEEDED.    Dispense:  60 tablet    Refill:  0  . diclofenac (VOLTAREN) 75 MG EC tablet    Sig: Take 1 tablet (75 mg total) by mouth 2 (two) times daily.    Dispense:  60 tablet    Refill:  5   5:20 PM 05/28/2018

## 2018-05-28 NOTE — Addendum Note (Signed)
Addended by: Carole Civil on: 05/28/2018 05:20 PM   Modules accepted: Orders

## 2018-05-28 NOTE — Telephone Encounter (Signed)
Patient called following appointment today, 05/28/18, asking if medication was being refilled at [facility] for her Tramadol 50mg  tablet?

## 2018-05-28 NOTE — Patient Instructions (Signed)
Patient will go to [facility] imaging for epidural injections lumbar spine

## 2018-05-30 ENCOUNTER — Other Ambulatory Visit: Payer: Self-pay | Admitting: Orthopedic Surgery

## 2018-05-30 DIAGNOSIS — M5410 Radiculopathy, site unspecified: Secondary | ICD-10-CM

## 2018-06-04 ENCOUNTER — Encounter (HOSPITAL_COMMUNITY): Payer: Self-pay | Admitting: Emergency Medicine

## 2018-06-04 ENCOUNTER — Other Ambulatory Visit: Payer: Self-pay

## 2018-06-04 ENCOUNTER — Emergency Department (HOSPITAL_COMMUNITY): Payer: Self-pay

## 2018-06-04 ENCOUNTER — Emergency Department (HOSPITAL_COMMUNITY)
Admission: EM | Admit: 2018-06-04 | Discharge: 2018-06-04 | Disposition: A | Discharge status: Home / Self Care | Payer: Self-pay | Attending: Emergency Medicine | Admitting: Emergency Medicine

## 2018-06-04 DIAGNOSIS — R0602 Shortness of breath: Secondary | ICD-10-CM | POA: Insufficient documentation

## 2018-06-04 DIAGNOSIS — Z5321 Procedure and treatment not carried out due to patient leaving prior to being seen by health care provider: Secondary | ICD-10-CM | POA: Insufficient documentation

## 2018-06-04 MED ORDER — ALBUTEROL SULFATE (2.5 MG/3ML) 0.083% IN NEBU
5.0000 mg | INHALATION_SOLUTION | Freq: Once | RESPIRATORY_TRACT | Status: AC
  Administered 2018-06-04: 5 mg via RESPIRATORY_TRACT
  Filled 2018-06-04: qty 6

## 2018-06-04 NOTE — ED Triage Notes (Addendum)
Pt c/o SOB x 1 week. Pt hx of asthma and COPD. Reports nebulizer tx at home with no relief. Audible wheezing. Also c/o cough and subjective fever.

## 2018-06-04 NOTE — ED Notes (Signed)
RT notified for neb tx. 

## 2018-06-04 NOTE — ED Notes (Signed)
Called for room with no response

## 2018-06-04 NOTE — ED Notes (Signed)
Pt moved to waiting room behind triage for neb tx.

## 2018-06-06 ENCOUNTER — Other Ambulatory Visit: Payer: Self-pay

## 2018-06-06 ENCOUNTER — Emergency Department (HOSPITAL_COMMUNITY): Payer: Self-pay

## 2018-06-06 ENCOUNTER — Encounter (HOSPITAL_COMMUNITY): Payer: Self-pay | Admitting: Emergency Medicine

## 2018-06-06 ENCOUNTER — Observation Stay (HOSPITAL_COMMUNITY)
Admission: EM | Admit: 2018-06-06 | Discharge: 2018-06-07 | Disposition: A | Discharge status: Home / Self Care | Payer: Self-pay | Attending: Internal Medicine | Admitting: Internal Medicine

## 2018-06-06 DIAGNOSIS — J45909 Unspecified asthma, uncomplicated: Secondary | ICD-10-CM | POA: Diagnosis present

## 2018-06-06 DIAGNOSIS — E669 Obesity, unspecified: Secondary | ICD-10-CM | POA: Insufficient documentation

## 2018-06-06 DIAGNOSIS — J441 Chronic obstructive pulmonary disease with (acute) exacerbation: Principal | ICD-10-CM

## 2018-06-06 DIAGNOSIS — M25561 Pain in right knee: Secondary | ICD-10-CM

## 2018-06-06 DIAGNOSIS — G8929 Other chronic pain: Secondary | ICD-10-CM

## 2018-06-06 DIAGNOSIS — E66813 Obesity, class 3: Secondary | ICD-10-CM

## 2018-06-06 DIAGNOSIS — J471 Bronchiectasis with (acute) exacerbation: Secondary | ICD-10-CM

## 2018-06-06 DIAGNOSIS — E1165 Type 2 diabetes mellitus with hyperglycemia: Secondary | ICD-10-CM

## 2018-06-06 DIAGNOSIS — F1410 Cocaine abuse, uncomplicated: Secondary | ICD-10-CM | POA: Diagnosis present

## 2018-06-06 DIAGNOSIS — F17210 Nicotine dependence, cigarettes, uncomplicated: Secondary | ICD-10-CM | POA: Insufficient documentation

## 2018-06-06 DIAGNOSIS — Z79899 Other long term (current) drug therapy: Secondary | ICD-10-CM | POA: Insufficient documentation

## 2018-06-06 DIAGNOSIS — E119 Type 2 diabetes mellitus without complications: Secondary | ICD-10-CM

## 2018-06-06 DIAGNOSIS — J449 Chronic obstructive pulmonary disease, unspecified: Secondary | ICD-10-CM

## 2018-06-06 DIAGNOSIS — Z794 Long term (current) use of insulin: Secondary | ICD-10-CM | POA: Insufficient documentation

## 2018-06-06 DIAGNOSIS — J45901 Unspecified asthma with (acute) exacerbation: Secondary | ICD-10-CM | POA: Diagnosis present

## 2018-06-06 DIAGNOSIS — K219 Gastro-esophageal reflux disease without esophagitis: Secondary | ICD-10-CM

## 2018-06-06 LAB — TROPONIN I

## 2018-06-06 LAB — RAPID URINE DRUG SCREEN, HOSP PERFORMED
AMPHETAMINES: NOT DETECTED
Barbiturates: NOT DETECTED
Benzodiazepines: NOT DETECTED
Cocaine: NOT DETECTED
Opiates: NOT DETECTED
Tetrahydrocannabinol: NOT DETECTED

## 2018-06-06 LAB — CBC WITH DIFFERENTIAL/PLATELET
Abs Immature Granulocytes: 0.03 10*3/uL (ref 0.00–0.07)
BASOS PCT: 1 %
Basophils Absolute: 0.1 10*3/uL (ref 0.0–0.1)
EOS ABS: 0.2 10*3/uL (ref 0.0–0.5)
EOS PCT: 2 %
HEMATOCRIT: 40.9 % (ref 36.0–46.0)
Hemoglobin: 13 g/dL (ref 12.0–15.0)
Immature Granulocytes: 0 %
LYMPHS ABS: 3.2 10*3/uL (ref 0.7–4.0)
Lymphocytes Relative: 43 %
MCH: 28.2 pg (ref 26.0–34.0)
MCHC: 31.8 g/dL (ref 30.0–36.0)
MCV: 88.7 fL (ref 80.0–100.0)
MONOS PCT: 12 %
Monocytes Absolute: 0.9 10*3/uL (ref 0.1–1.0)
NEUTROS PCT: 42 %
NRBC: 0 % (ref 0.0–0.2)
Neutro Abs: 3.1 10*3/uL (ref 1.7–7.7)
PLATELETS: 268 10*3/uL (ref 150–400)
RBC: 4.61 MIL/uL (ref 3.87–5.11)
RDW: 14.5 % (ref 11.5–15.5)
WBC: 7.4 10*3/uL (ref 4.0–10.5)

## 2018-06-06 LAB — BASIC METABOLIC PANEL
Anion gap: 10 (ref 5–15)
BUN: 9 mg/dL (ref 8–23)
CALCIUM: 8.9 mg/dL (ref 8.9–10.3)
CO2: 24 mmol/L (ref 22–32)
CREATININE: 0.88 mg/dL (ref 0.44–1.00)
Chloride: 101 mmol/L (ref 98–111)
GFR calc Af Amer: >60 mL/min (ref >60)
GLUCOSE: 285 mg/dL — AB (ref 70–99)
Potassium: 4.3 mmol/L (ref 3.5–5.1)
Sodium: 135 mmol/L (ref 135–145)

## 2018-06-06 LAB — TSH: TSH: 0.454 u[IU]/mL (ref 0.350–4.500)

## 2018-06-06 LAB — GLUCOSE, CAPILLARY: GLUCOSE-CAPILLARY: 407 mg/dL — AB (ref 70–99)

## 2018-06-06 LAB — CBG MONITORING, ED: Glucose-Capillary: 309 mg/dL — ABNORMAL HIGH (ref 70–99)

## 2018-06-06 LAB — INFLUENZA PANEL BY PCR (TYPE A & B)
INFLAPCR: NEGATIVE
Influenza B By PCR: NEGATIVE

## 2018-06-06 MED ORDER — LISINOPRIL 5 MG PO TABS
5.0000 mg | ORAL_TABLET | Freq: Every day | ORAL | Status: DC
  Administered 2018-06-07: 5 mg via ORAL
  Filled 2018-06-06: qty 1

## 2018-06-06 MED ORDER — POLYETHYLENE GLYCOL 3350 17 G PO PACK: 17.0000 g | PACK | Freq: Every day | ORAL | Status: DC | PRN

## 2018-06-06 MED ORDER — SODIUM CHLORIDE 0.9 % IV SOLN
500.0000 mg | INTRAVENOUS | Status: DC
  Administered 2018-06-06: 500 mg via INTRAVENOUS
  Filled 2018-06-06 (×2): qty 500

## 2018-06-06 MED ORDER — ACETAMINOPHEN 325 MG PO TABS: 650.0000 mg | ORAL_TABLET | Freq: Four times a day (QID) | ORAL | Status: DC | PRN

## 2018-06-06 MED ORDER — DIVALPROEX SODIUM ER 500 MG PO TB24
500.0000 mg | ORAL_TABLET | Freq: Every day | ORAL | Status: DC
  Administered 2018-06-07: 500 mg via ORAL
  Filled 2018-06-06: qty 1

## 2018-06-06 MED ORDER — ACETAMINOPHEN 650 MG RE SUPP: 650.0000 mg | Freq: Four times a day (QID) | RECTAL | Status: DC | PRN

## 2018-06-06 MED ORDER — IPRATROPIUM-ALBUTEROL 0.5-2.5 (3) MG/3ML IN SOLN
3.0000 mL | Freq: Four times a day (QID) | RESPIRATORY_TRACT | Status: DC
  Administered 2018-06-06 – 2018-06-07 (×4): 3 mL via RESPIRATORY_TRACT
  Filled 2018-06-06 (×4): qty 3

## 2018-06-06 MED ORDER — ENOXAPARIN SODIUM 80 MG/0.8ML ~~LOC~~ SOLN
70.0000 mg | SUBCUTANEOUS | Status: DC
  Administered 2018-06-06: 70 mg via SUBCUTANEOUS
  Filled 2018-06-06: qty 0.8

## 2018-06-06 MED ORDER — METHYLPREDNISOLONE SODIUM SUCC 125 MG IJ SOLR
80.0000 mg | Freq: Two times a day (BID) | INTRAMUSCULAR | Status: DC
  Administered 2018-06-06 – 2018-06-07 (×2): 80 mg via INTRAVENOUS
  Filled 2018-06-06 (×2): qty 2

## 2018-06-06 MED ORDER — SODIUM CHLORIDE 0.9 % IV SOLN
1.0000 g | Freq: Once | INTRAVENOUS | Status: AC
  Administered 2018-06-06: 1 g via INTRAVENOUS
  Filled 2018-06-06: qty 10

## 2018-06-06 MED ORDER — ONDANSETRON HCL 4 MG/2ML IJ SOLN: 4.0000 mg | Freq: Four times a day (QID) | INTRAMUSCULAR | Status: DC | PRN

## 2018-06-06 MED ORDER — SODIUM CHLORIDE 0.9 % IV BOLUS
1000.0000 mL | Freq: Once | INTRAVENOUS | Status: AC
  Administered 2018-06-06: 1000 mL via INTRAVENOUS

## 2018-06-06 MED ORDER — OXYBUTYNIN CHLORIDE 5 MG PO TABS
5.0000 mg | ORAL_TABLET | Freq: Two times a day (BID) | ORAL | Status: DC
  Administered 2018-06-06 – 2018-06-07 (×2): 5 mg via ORAL
  Filled 2018-06-06 (×2): qty 1

## 2018-06-06 MED ORDER — SODIUM CHLORIDE 0.9 % IV SOLN
INTRAVENOUS | Status: AC
  Administered 2018-06-06 – 2018-06-07 (×2): via INTRAVENOUS

## 2018-06-06 MED ORDER — GABAPENTIN 300 MG PO CAPS
900.0000 mg | ORAL_CAPSULE | Freq: Three times a day (TID) | ORAL | Status: DC
  Administered 2018-06-06 – 2018-06-07 (×3): 900 mg via ORAL
  Filled 2018-06-06 (×3): qty 3

## 2018-06-06 MED ORDER — ONDANSETRON HCL 4 MG PO TABS: 4.0000 mg | ORAL_TABLET | Freq: Four times a day (QID) | ORAL | Status: DC | PRN

## 2018-06-06 MED ORDER — DIVALPROEX SODIUM ER 500 MG PO TB24: 500.0000 mg | ORAL_TABLET | ORAL | Status: DC

## 2018-06-06 MED ORDER — DM-GUAIFENESIN ER 30-600 MG PO TB12
1.0000 | ORAL_TABLET | Freq: Two times a day (BID) | ORAL | Status: DC
  Administered 2018-06-06 – 2018-06-07 (×2): 1 via ORAL
  Filled 2018-06-06 (×2): qty 1

## 2018-06-06 MED ORDER — METHYLPREDNISOLONE SODIUM SUCC 125 MG IJ SOLR
125.0000 mg | Freq: Once | INTRAMUSCULAR | Status: AC
  Administered 2018-06-06: 125 mg via INTRAVENOUS
  Filled 2018-06-06: qty 2

## 2018-06-06 MED ORDER — INSULIN ASPART PROT & ASPART (70-30 MIX) 100 UNIT/ML ~~LOC~~ SUSP
35.0000 [IU] | Freq: Two times a day (BID) | SUBCUTANEOUS | Status: DC
  Filled 2018-06-06: qty 10

## 2018-06-06 MED ORDER — SODIUM CHLORIDE 0.9 % IV SOLN
2.0000 g | INTRAVENOUS | Status: DC
  Filled 2018-06-06: qty 20

## 2018-06-06 MED ORDER — IPRATROPIUM-ALBUTEROL 0.5-2.5 (3) MG/3ML IN SOLN
3.0000 mL | RESPIRATORY_TRACT | Status: DC | PRN
  Filled 2018-06-06: qty 3

## 2018-06-06 MED ORDER — CITALOPRAM HYDROBROMIDE 20 MG PO TABS
20.0000 mg | ORAL_TABLET | Freq: Every day | ORAL | Status: DC
  Administered 2018-06-07: 20 mg via ORAL
  Filled 2018-06-06: qty 1

## 2018-06-06 MED ORDER — DIVALPROEX SODIUM ER 500 MG PO TB24
1000.0000 mg | ORAL_TABLET | Freq: Every day | ORAL | Status: DC
  Administered 2018-06-06: 1000 mg via ORAL
  Filled 2018-06-06: qty 2

## 2018-06-06 MED ORDER — IPRATROPIUM-ALBUTEROL 0.5-2.5 (3) MG/3ML IN SOLN
3.0000 mL | Freq: Once | RESPIRATORY_TRACT | Status: AC
  Administered 2018-06-06: 3 mL via RESPIRATORY_TRACT
  Filled 2018-06-06: qty 3

## 2018-06-06 MED ORDER — INSULIN ASPART 100 UNIT/ML ~~LOC~~ SOLN
10.0000 [IU] | Freq: Once | SUBCUTANEOUS | Status: AC
  Administered 2018-06-06: 10 [IU] via SUBCUTANEOUS

## 2018-06-06 MED ORDER — INSULIN ASPART 100 UNIT/ML ~~LOC~~ SOLN
0.0000 [IU] | Freq: Three times a day (TID) | SUBCUTANEOUS | Status: DC
  Administered 2018-06-07 (×2): 15 [IU] via SUBCUTANEOUS
  Administered 2018-06-07: 8 [IU] via SUBCUTANEOUS

## 2018-06-06 MED ORDER — ALBUTEROL (5 MG/ML) CONTINUOUS INHALATION SOLN
10.0000 mg/h | INHALATION_SOLUTION | Freq: Once | RESPIRATORY_TRACT | Status: AC
  Administered 2018-06-06: 10 mg/h via RESPIRATORY_TRACT
  Filled 2018-06-06: qty 20

## 2018-06-06 MED ORDER — AMLODIPINE BESYLATE 5 MG PO TABS
5.0000 mg | ORAL_TABLET | Freq: Every day | ORAL | Status: DC
  Administered 2018-06-07: 5 mg via ORAL
  Filled 2018-06-06: qty 1

## 2018-06-06 MED ORDER — INSULIN ASPART PROT & ASPART (70-30 MIX) 100 UNIT/ML ~~LOC~~ SUSP
SUBCUTANEOUS | Status: AC
  Filled 2018-06-06: qty 10

## 2018-06-06 MED ORDER — ALBUTEROL SULFATE (2.5 MG/3ML) 0.083% IN NEBU
2.5000 mg | INHALATION_SOLUTION | Freq: Once | RESPIRATORY_TRACT | Status: AC
  Administered 2018-06-06: 2.5 mg via RESPIRATORY_TRACT
  Filled 2018-06-06: qty 3

## 2018-06-06 MED ORDER — SODIUM CHLORIDE 0.9 % IV SOLN
1.0000 g | Freq: Once | INTRAVENOUS | Status: AC
  Administered 2018-06-06: 1 g via INTRAVENOUS
  Filled 2018-06-06 (×2): qty 10

## 2018-06-06 NOTE — ED Provider Notes (Addendum)
[facility] EMERGENCY DEPARTMENT Provider Note   CSN: [id number] Arrival date & time: 06/06/18  1037     History   Chief Complaint Chief Complaint  Patient presents with  . Shortness of Breath    HPI Rhonda Hickman is a 62 y.o. female.  HPI   Rhonda Hickman is a 62 y.o. female with history of DM, COPD and asthma,  presents to the Emergency Department complaining of chest tightness, shortness of breath, wheezing with cough.  Symptoms have been present for 2 weeks.  She has been using her albuterol nebulizer treatment at home every 6 hours with minimal and temporary relief.  She endorses labored breathing with exertion and chest tightness.  She states she gets similar symptoms every year around this time.  She denies fever, chills, nasal congestion or sore throat.  No peripheral edema.    Past Medical History:  Diagnosis Date  . Anxiety   . Asthma   . Bipolar 1 disorder ([place])   . Bulging lumbar disc   . Chronic pain of left knee   . COPD (chronic obstructive pulmonary disease) ([place])   . Diabetes mellitus   . Neuropathy     Patient Active Problem List   Diagnosis Date Noted  . Gait abnormality 08/29/2017  . Urinary incontinence 08/29/2017  . Paresthesia 08/29/2017  . Derangement of posterior horn of medial meniscus of right knee   . Meniscus, lateral, derangement, right   . Arthritis of knee, right   . COPD (chronic obstructive pulmonary disease) ([place]) 03/09/2016  . DIABETES MELLITUS, TYPE II 02/16/2007  . ABUSE, COCAINE, UNSPECIFIED 02/16/2007  . ASTHMA, EXTRINSIC NOS 02/16/2007  . HOMELESSNESS, HX OF 02/16/2007    Past Surgical History:  Procedure Laterality Date  . ABDOMINAL HYSTERECTOMY    . CARPAL TUNNEL RELEASE Right   . KNEE ARTHROSCOPY WITH MEDIAL MENISECTOMY Right 04/14/2016   Procedure: KNEE ARTHROSCOPY WITH MEDIAL AND LATERAL MENISECTOMY, MICROFRACTURE REPAIR;  Surgeon: Carole Civil, MD;  Location: [facility];  Service: Orthopedics;  Laterality:  Right;  lateral menisectomy - needs crutch training     OB History   None      Home Medications    Prior to Admission medications   Medication Sig Start Date End Date Taking? Authorizing Provider  albuterol (PROVENTIL HFA;VENTOLIN HFA) 108 (90 Base) MCG/ACT inhaler Inhale 2 puffs into the lungs every 4 (four) hours as needed. 08/20/17   Rolland Porter, MD  albuterol (PROVENTIL) (2.5 MG/3ML) 0.083% nebulizer solution Take 2.5 mg by nebulization every 6 (six) hours as needed for shortness of breath.     [provider]  amLODipine (NORVASC) 5 MG tablet Take 5 mg by mouth daily.    [provider]  citalopram (CELEXA) 20 MG tablet Take 20 mg by mouth daily.    [provider]  diclofenac (VOLTAREN) 75 MG EC tablet TAKE (1) TABLET TWICE A DAY WITH MEALS (BREAKFAST AND SUPPER) 12/12/17   Carole Civil, MD  diclofenac (VOLTAREN) 75 MG EC tablet TAKE (1) TABLET TWICE A DAY WITH MEALS (BREAKFAST AND SUPPER) 03/02/18   Carole Civil, MD  diclofenac (VOLTAREN) 75 MG EC tablet Take 1 tablet (75 mg total) by mouth 2 (two) times daily. 05/28/18   Carole Civil, MD  divalproex (DEPAKOTE ER) 500 MG 24 hr tablet Take 1,000-1,500 mg by mouth 2 (two) times daily. 2 tablets in the morning and 3 tablets at bedtime.    [provider]  gabapentin (NEURONTIN) 300 MG capsule  Take 3 capsules (900 mg total) by mouth 3 (three) times daily. 01/10/18   Marcial Pacas, MD  insulin NPH-regular (NOVOLIN 70/30) (70-30) 100 UNIT/ML injection Inject 50 Units into the skin 3 (three) times daily.     [provider]  lisinopril (PRINIVIL,ZESTRIL) 5 MG tablet Take 5 mg by mouth daily.    [provider]  oxybutynin (DITROPAN) 5 MG tablet Take 5 mg by mouth 2 (two) times daily.    [provider]  risperiDONE (RISPERDAL) 0.25 MG tablet Take 0.25 mg by mouth at bedtime.    [provider]  traMADol (ULTRAM) 50 MG tablet TAKE (1) TABLET EVERY SIX HOURS AS  NEEDED. 05/28/18   Carole Civil, MD  traZODone (DESYREL) 100 MG tablet Take 200 mg by mouth at bedtime.     [provider]    Family History Family History  Problem Relation Age of Onset  . Diabetes Mother   . Heart attack Father     Social History Social History   Tobacco Use  . Smoking status: Current Some Day Smoker    Packs/day: 0.25    Types: Cigarettes  . Smokeless tobacco: Never Used  . Tobacco comment: trying to cut down  Substance Use Topics  . Alcohol use: No  . Drug use: No     Allergies   Aspirin and Ibuprofen   Review of Systems Review of Systems  Constitutional: Negative for appetite change, chills and fever.  HENT: Negative for congestion, sore throat and trouble swallowing.   Respiratory: Positive for cough, chest tightness, shortness of breath and wheezing.   Cardiovascular: Negative for chest pain and leg swelling.  Gastrointestinal: Negative for abdominal pain, nausea and vomiting.  Genitourinary: Negative for decreased urine volume and dysuria.  Musculoskeletal: Negative for arthralgias and myalgias.  Skin: Negative for rash.  Neurological: Negative for dizziness, weakness and numbness.  Hematological: Negative for adenopathy.     Physical Exam Updated Vital Signs BP 131/82 (BP Location: Right Arm)   Pulse 71   Temp 97.8 F (36.6 C) (Oral)   Resp (!) 24   Ht 6' (1.829 m)   Wt (!) 136.9 kg   SpO2 95%   BMI 40.93 kg/m   Physical Exam  Constitutional: She does not appear ill.  HENT:  Head: Normocephalic.  Mouth/Throat: Oropharynx is clear and moist.  Neck: Normal range of motion. No JVD present.  Cardiovascular: Normal rate, regular rhythm and normal heart sounds.  Pulmonary/Chest: She has wheezes.  course lung sounds bilaterally with inspiratory and expiratory wheezes present in all fields.  Patient's speech is labored, but able to speak in complete sentences.  Abdominal: Soft. She exhibits no distension. There is  no tenderness.  Musculoskeletal:       Right lower leg: Normal. She exhibits no edema.       Left lower leg: Normal. She exhibits no edema.  Neurological: She is alert. No sensory deficit.  Skin: Skin is warm. Capillary refill takes less than 2 seconds. No rash noted. No erythema.  Psychiatric: She has a normal mood and affect.     ED Treatments / Results  Labs (all labs ordered are listed, but only abnormal results are displayed) Labs Reviewed  BASIC METABOLIC PANEL - Abnormal; Notable for the following components:      Result Value   Glucose, Bld 285 (*)    All other components within normal limits  CBG MONITORING, ED - Abnormal; Notable for the following components:   Glucose-Capillary  309 (*)    All other components within normal limits  CBC WITH DIFFERENTIAL/PLATELET    EKG None  Radiology Dg Chest 2 View  Result Date: 06/04/2018 CLINICAL DATA:  62 y/o F; 1 week of cough and shortness of breath. History of asthma and COPD. EXAM: CHEST - 2 VIEW COMPARISON:  08/19/2017 chest radiograph FINDINGS: Right perihilar and left lower lobe patchy consolidations. Stable normal cardiac silhouette given projection and technique. No pleural effusion or pneumothorax. Bones are unremarkable. IMPRESSION: Right perihilar and left lower lobe patchy consolidations, likely pneumonia. Electronically Signed   By: Kristine Garbe M.D.   On: 06/04/2018 15:21    Procedures Procedures (including critical care time)  Medications Ordered in ED Medications  ipratropium-albuterol (DUONEB) 0.5-2.5 (3) MG/3ML nebulizer solution 3 mL (has no administration in time range)  albuterol (PROVENTIL) (2.5 MG/3ML) 0.083% nebulizer solution 2.5 mg (has no administration in time range)  methylPREDNISolone sodium succinate (SOLU-MEDROL) 125 mg/2 mL injection 125 mg (has no administration in time range)     Initial Impression / Assessment and Plan / ED Course  I have reviewed the triage vital signs and  the nursing notes.  Pertinent labs & imaging results that were available during my care of the patient were reviewed by me and considered in my medical decision making (see chart for details).     Patient with history of asthma and COPD.  Breathing is labored and she is actively wheezing.  No hypoxia.  Will obtain labs and chest x-ray and order albuterol neb.  Patient also seen by Dr. Lacinda Axon and care plan discussed.  On recheck, after albuterol neb and Solu-Medrol patient continues to actively wheeze, will order continuous albuterol.  Chest x-ray shows possible pneumonia, Rocephin and Zithromax ordered.  On second recheck, patient reports feeling better, but continues to wheeze and O2 sats dropped to 89% with ambulation.  I feel the patient needs hospital admission, she agrees to this plan.  Will consult hospitalist  Consulted Dr. Nestor Lewandowsky agrees to admit the patient.    After speaking with hospitalist, I was notified by nursing staff that patient was in atrial fib.  She has been on cardiac monitoring since arrival and this is a new finding.  No past medical history of atrial fib.  Possibly related to the continuous albuterol neb.  IV fluids and EKG ordered.   patient has been drinking oral fluids during her stay.  I called Dr. [name] back and discussed this new finding  Final Clinical Impressions(s) / ED Diagnoses   Final diagnoses:  Exacerbation of asthma, unspecified asthma severity, unspecified whether persistent    ED Discharge Orders    None       Kem Parkinson, PA-C 06/06/18 [place], Lu[place]as [place]ell, PA-C 06/06/18 2212    Nat Christen, MD 06/07/18 0830

## 2018-06-06 NOTE — ED Notes (Signed)
After placing patient back on monitor in bed, noticed patient's heart rate 120-130 with irregular rhythm. Obtained EKG and notified Tammy Triplett PA and gave to Dr Sedonia Small.

## 2018-06-06 NOTE — ED Notes (Signed)
Ambulated patient to nurses station and back to room. Patient's O2 saturation dropped to 89% during ambulation, respirations 30. Patient's breathing labored during ambulation. Placed patient back in bed in her room, when patient talking O2 saturation increased to 93%.

## 2018-06-06 NOTE — ED Notes (Addendum)
Charted blank note on wrong patient.

## 2018-06-06 NOTE — H&P (Addendum)
History and Physical    Rhonda Hickman TKZ:[id number] DOB: 1955-09-13 DOA: 06/06/2018  PCP: [facility] [place]   Patient coming from: Home  Chief Complaint: SOB, Cough  HPI: Rhonda Hickman is a 62 y.o. female with medical history significant COPD, Asthma, DM, cocaine abuse, who presented to the ED with complaints of shortness of breath and cough of 2 weeks duration.  Cough is productive of thick dark brown/greenish sputum.  Reports subjective fevers.  Reports chest tightness and wheezing.  Also reports chest pain right-sided yesterday stabbing lasting ~16mins, nonradiating woke up from sleep, self resolved. Patient currently smokes 3PPD.  Reports last use of cocaine and alcohol intake was ~4 years ago.  History of heart attack in her sister at age 5 requiring stent.  No personal or family history of blood clots  ED Course: Initial regular heart rates, initial tachypnea to 25.  O2 sats 91% on room air dropped to 89% with ambulation.  Two-view chest x-ray right upper lobe pneumonia.  WBC 7.4.  Patient started on IV ceftriaxone and azithromycin.  1 L bolus given. While in the ED it was noted that patient was in atrial fibrillation, with rates in the 120s.  And had just completed 1 hr neb treatments and had come back from the bathroom.  EKG was done read as A. fib by device, rate 127 -but I do see some P waves, and rhythm appears mostly regular.   Review of Systems: As per HPI all other systems reviewed and negative  Past Medical History:  Diagnosis Date  . Anxiety   . Asthma   . Bipolar 1 disorder ([place])   . Bulging lumbar disc   . Chronic pain of left knee   . COPD (chronic obstructive pulmonary disease) ([place])   . Diabetes mellitus   . Neuropathy     Past Surgical History:  Procedure Laterality Date  . ABDOMINAL HYSTERECTOMY    . CARPAL TUNNEL RELEASE Right   . KNEE ARTHROSCOPY WITH MEDIAL MENISECTOMY Right 04/14/2016   Procedure: KNEE ARTHROSCOPY WITH MEDIAL AND LATERAL  MENISECTOMY, MICROFRACTURE REPAIR;  Surgeon: Carole Civil, MD;  Location: [facility];  Service: Orthopedics;  Laterality: Right;  lateral menisectomy - needs crutch training     reports that she has been smoking cigarettes. She has been smoking about 0.25 packs per day. She has never used smokeless tobacco. She reports that she does not drink alcohol or use drugs.  Allergies  Allergen Reactions  . Aspirin Nausea Only and Other (See Comments)    Causes stomach pain  . Ibuprofen     Stomach problems    Family History  Problem Relation Age of Onset  . Diabetes Mother   . Heart attack Father     Prior to Admission medications   Medication Sig Start Date End Date Taking? Authorizing Provider  albuterol (PROVENTIL HFA;VENTOLIN HFA) 108 (90 Base) MCG/ACT inhaler Inhale 2 puffs into the lungs every 4 (four) hours as needed. Patient taking differently: Inhale 2 puffs into the lungs every 4 (four) hours as needed for wheezing.  08/20/17  Yes Rolland Porter, MD  amLODipine (NORVASC) 5 MG tablet Take 5 mg by mouth daily.   Yes [provider]  citalopram (CELEXA) 20 MG tablet Take 20 mg by mouth daily.   Yes [provider]  diclofenac (VOLTAREN) 75 MG EC tablet Take 1 tablet (75 mg total) by mouth 2 (two) times daily. 05/28/18  Yes Carole Civil, MD  divalproex (DEPAKOTE ER) 500  MG 24 hr tablet Take 500-1,000 mg by mouth See admin instructions. 500MG  in the morning and 1000mg  at bedtime 03/26/18  Yes [provider]  gabapentin (NEURONTIN) 300 MG capsule Take 3 capsules (900 mg total) by mouth 3 (three) times daily. 01/10/18  Yes Marcial Pacas, MD  insulin NPH-regular (NOVOLIN 70/30) (70-30) 100 UNIT/ML injection Inject 50 Units into the skin 3 (three) times daily.    Yes [provider]  lisinopril (PRINIVIL,ZESTRIL) 5 MG tablet Take 5 mg by mouth daily.   Yes [provider]  oxybutynin (DITROPAN) 5 MG tablet Take 5 mg by mouth 2 (two) times daily.   Yes  [provider]  traMADol (ULTRAM) 50 MG tablet TAKE (1) TABLET EVERY SIX HOURS AS NEEDED. Patient taking differently: Take 50 mg by mouth every 6 (six) hours as needed for moderate pain or severe pain.  05/28/18  Yes Carole Civil, MD  traZODone (DESYREL) 100 MG tablet Take 200 mg by mouth at bedtime as needed for sleep.    Yes [provider]  albuterol (PROVENTIL) (2.5 MG/3ML) 0.083% nebulizer solution Take 2.5 mg by nebulization every 6 (six) hours as needed for shortness of breath.     [provider]  diclofenac (VOLTAREN) 75 MG EC tablet TAKE (1) TABLET TWICE A DAY WITH MEALS (BREAKFAST AND SUPPER) Patient not taking: Reported on 06/06/2018 12/12/17   Carole Civil, MD  diclofenac (VOLTAREN) 75 MG EC tablet TAKE (1) TABLET TWICE A DAY WITH MEALS (BREAKFAST AND SUPPER) Patient not taking: Reported on 06/06/2018 03/02/18   Carole Civil, MD    Physical Exam: Vitals:   06/06/18 1406 06/06/18 1536 06/06/18 1621 06/06/18 1630  BP: (!) 127/49 134/68 118/72 (!) 130/96  Pulse: 75 85 (!) 119 (!) 118  Resp: (!) 23 (!) 25 20 20   Temp:      TempSrc:      SpO2: 91% 91% 93% 91%  Weight:      Height:        Constitutional: NAD, calm, comfortable Vitals:   06/06/18 1406 06/06/18 1536 06/06/18 1621 06/06/18 1630  BP: (!) 127/49 134/68 118/72 (!) 130/96  Pulse: 75 85 (!) 119 (!) 118  Resp: (!) 23 (!) 25 20 20   Temp:      TempSrc:      SpO2: 91% 91% 93% 91%  Weight:      Height:       Eyes: PERRL, lids and conjunctivae normal ENMT: Mucous membranes are dry. Posterior pharynx clear of any exudate or lesions. Neck: normal, supple, no masses, no thyromegaly Respiratory: diffuse rhonchi, Normal respiratory effort. No accessory muscle use.  Cardiovascular: Tachycardic, Regular rate and rhythm, no murmurs / rubs / gallops. No extremity edema. 2+ pedal pulses. No carotid bruits.  Abdomen: no tenderness, no masses palpated. No hepatosplenomegaly. Bowel  sounds positive.  Musculoskeletal: no clubbing / cyanosis. No joint deformity upper and lower extremities. Good ROM, no contractures. Normal muscle tone.  Skin: no rashes, lesions, ulcers. No induration Neurologic: CN 2-12 grossly intact. Strength 5/5 in all 4.  Psychiatric: Normal judgment and insight. Alert and oriented x 3. Normal mood.   Labs on Admission: I have personally reviewed following labs and imaging studies  CBC: Recent Labs  Lab 06/06/18 1129  WBC 7.4  NEUTROABS 3.1  HGB 13.0  HCT 40.9  MCV 88.7  PLT 625   Basic Metabolic Panel: Recent Labs  Lab 06/06/18 1129  NA 135  K 4.3  CL 101  CO2 24  GLUCOSE 285*  BUN 9  CREATININE 0.88  CALCIUM 8.9   CBG: Recent Labs  Lab 06/06/18 1114  GLUCAP 309*   Urine analysis:    Component Value Date/Time   COLORURINE YELLOW 09/12/2013 1642   APPEARANCEUR CLEAR 09/12/2013 1642   LABSPEC >1.030 (H) 09/12/2013 1642   PHURINE 5.0 09/12/2013 1642   GLUCOSEU >1000 (A) 09/12/2013 1642   HGBUR NEGATIVE 09/12/2013 1642   BILIRUBINUR NEGATIVE 09/12/2013 1642   KETONESUR NEGATIVE 09/12/2013 1642   PROTEINUR NEGATIVE 09/12/2013 1642   UROBILINOGEN 0.2 09/12/2013 1642   NITRITE NEGATIVE 09/12/2013 1642   LEUKOCYTESUR NEGATIVE 09/12/2013 1642    Radiological Exams on Admission: Dg Chest 2 View  Result Date: 06/06/2018 CLINICAL DATA:  Shortness of breath EXAM: CHEST - 2 VIEW COMPARISON:  June 04, 2018 and August 19, 2017 FINDINGS: There is persistent perihilar opacity on the right with infiltrate in a portion of the anterior segment of the right upper lobe inferiorly, slightly less prominent than 2 days prior. There is also a small area of infiltrate in the posterior left base. Lungs elsewhere are clear. Heart size and pulmonary vascularity are normal. No adenopathy. No bone lesions. IMPRESSION: Infiltrate consistent with pneumonia in the anterior segment of the right upper lobe inferiorly and in the posterior left base  regions. Lungs elsewhere clear. Heart size normal. No adenopathy evident. Followup PA and lateral chest radiographs recommended in 3-4 weeks following trial of antibiotic therapy to ensure resolution and exclude underlying malignancy. Electronically Signed   By: Lowella Grip III M.D.   On: 06/06/2018 11:33    EKG: Independently reviewed.  P waves mostly present, Regular rhythm. ? Sinus tach.  Assessment/Plan Active Problems:   DM (diabetes mellitus) ([facility])   Cocaine abuse ([facility])   Extrinsic asthma   COPD (chronic obstructive pulmonary disease) ([facility])   Asthma exacerbation  Asthma/COPD exacerbation- SOB, productive cough, significant rhonchi on exam.  X-ray pneumonia.  125mg  of Medrol given in ED. Still smokes. -IV Solu-Medrol 80 BID -Duo nebs as needed, scheduled -Mucolytics, supplemental O2 -Influenza  Pneumonia- RUL pneumonia on chest x-ray.  WBC 7.4.  -Continue IV ceftriaxone and azithromycin started in ED -X-ray 3 to 4 weeks to ensure resolution.  Chest pain- Heart score at least 3, for risk factors.  History of CAD sister- age 6. ?Pleuritic pain from PNA. ? Sinus tach- read as atrial fibrillation by device, I do see P waves.  Rate is regular. About 89mins later, When I was in room, after call out about "a fib",  monitor showed sinus tach. Likely from prolonged neb treatment. -TSH - Trops x 3 - EKG a.m - ECHO - UDS  Absence abuse - tobacco Abuse- 3PPD.  Reports last use of cocaine 4 years ago -Counseled to quit - UDS  DM-glucose 285.  Takes 70/30  50 units 3 times daily - SSI - HGba1c - Cont home 70/30 at reduced dose 35u BID  HTN- Stable - Cont home Norvasc, lisinopril  Depression and mood swings-   - Cont depakote and celexa  DVT prophylaxis: Lovenox Code Status: Full Family Communication: None at bedside Disposition Plan: per rounding team Consults called: none Admission status: Obs, tele   Bethena Roys MD [facility] Pager [phone] From 3PM-11PM.  Otherwise please contact night-coverage [email or web address] Password [facility]  06/06/2018, 7:25 PM

## 2018-06-06 NOTE — ED Notes (Signed)
Gave patient meal tray.

## 2018-06-06 NOTE — ED Triage Notes (Signed)
Patient complaining of cough and shortness of breath x 2 weeks. Patient with labored breathing at triage.

## 2018-06-07 ENCOUNTER — Other Ambulatory Visit (HOSPITAL_COMMUNITY): Payer: Self-pay | Admitting: Family Medicine

## 2018-06-07 ENCOUNTER — Other Ambulatory Visit (HOSPITAL_COMMUNITY): Payer: Self-pay

## 2018-06-07 DIAGNOSIS — Z794 Long term (current) use of insulin: Secondary | ICD-10-CM

## 2018-06-07 DIAGNOSIS — E66813 Obesity, class 3: Secondary | ICD-10-CM

## 2018-06-07 DIAGNOSIS — J441 Chronic obstructive pulmonary disease with (acute) exacerbation: Principal | ICD-10-CM

## 2018-06-07 DIAGNOSIS — K219 Gastro-esophageal reflux disease without esophagitis: Secondary | ICD-10-CM

## 2018-06-07 DIAGNOSIS — E1165 Type 2 diabetes mellitus with hyperglycemia: Secondary | ICD-10-CM

## 2018-06-07 DIAGNOSIS — J471 Bronchiectasis with (acute) exacerbation: Secondary | ICD-10-CM

## 2018-06-07 LAB — GLUCOSE, CAPILLARY
GLUCOSE-CAPILLARY: 286 mg/dL — AB (ref 70–99)
Glucose-Capillary: 407 mg/dL — ABNORMAL HIGH (ref 70–99)
Glucose-Capillary: 363 mg/dL — ABNORMAL HIGH (ref 70–99)

## 2018-06-07 LAB — TROPONIN I: Troponin I: <0.03 ng/mL (ref <0.03)

## 2018-06-07 MED ORDER — AMOXICILLIN-POT CLAVULANATE 875-125 MG PO TABS: 1.0000 | ORAL_TABLET | Freq: Two times a day (BID) | ORAL | 0 refills | Status: AC

## 2018-06-07 MED ORDER — BENZONATATE 100 MG PO CAPS
100.0000 mg | ORAL_CAPSULE | Freq: Three times a day (TID) | ORAL | Status: DC | PRN
  Administered 2018-06-07: 100 mg via ORAL
  Filled 2018-06-07: qty 1

## 2018-06-07 MED ORDER — PANTOPRAZOLE SODIUM 40 MG PO TBEC: 40.0000 mg | DELAYED_RELEASE_TABLET | Freq: Every day | ORAL | 1 refills | Status: DC

## 2018-06-07 MED ORDER — GUAIFENESIN-DM 100-10 MG/5ML PO SYRP
5.0000 mL | ORAL_SOLUTION | ORAL | Status: DC | PRN
  Administered 2018-06-07 (×2): 5 mL via ORAL
  Filled 2018-06-07 (×2): qty 5

## 2018-06-07 MED ORDER — BUDESONIDE 0.5 MG/2ML IN SUSP
0.5000 mg | Freq: Two times a day (BID) | RESPIRATORY_TRACT | Status: DC
  Administered 2018-06-07: 0.5 mg via RESPIRATORY_TRACT
  Filled 2018-06-07: qty 2

## 2018-06-07 MED ORDER — DM-GUAIFENESIN ER 30-600 MG PO TB12: 1.0000 | ORAL_TABLET | Freq: Two times a day (BID) | ORAL | 0 refills | Status: DC

## 2018-06-07 MED ORDER — TIOTROPIUM BROMIDE MONOHYDRATE 18 MCG IN CAPS: 18.0000 ug | ORAL_CAPSULE | Freq: Every day | RESPIRATORY_TRACT | 3 refills | Status: DC

## 2018-06-07 MED ORDER — DM-GUAIFENESIN ER 30-600 MG PO TB12: 1.0000 | ORAL_TABLET | Freq: Two times a day (BID) | ORAL | Status: DC

## 2018-06-07 MED ORDER — FLUTICASONE-SALMETEROL 250-50 MCG/DOSE IN AEPB: 1.0000 | INHALATION_SPRAY | Freq: Two times a day (BID) | RESPIRATORY_TRACT | 3 refills | Status: DC

## 2018-06-07 MED ORDER — AMOXICILLIN-POT CLAVULANATE 875-125 MG PO TABS
1.0000 | ORAL_TABLET | Freq: Two times a day (BID) | ORAL | Status: DC
  Administered 2018-06-07: 1 via ORAL
  Filled 2018-06-07: qty 1

## 2018-06-07 MED ORDER — TRAMADOL HCL 50 MG PO TABS: 50.0000 mg | ORAL_TABLET | Freq: Four times a day (QID) | ORAL | 0 refills | Status: DC | PRN

## 2018-06-07 MED ORDER — BENZONATATE 100 MG PO CAPS: 100.0000 mg | ORAL_CAPSULE | Freq: Three times a day (TID) | ORAL | 0 refills | Status: DC | PRN

## 2018-06-07 MED ORDER — INSULIN ASPART PROT & ASPART (70-30 MIX) 100 UNIT/ML ~~LOC~~ SUSP
35.0000 [IU] | Freq: Three times a day (TID) | SUBCUTANEOUS | Status: DC
  Administered 2018-06-07 (×3): 35 [IU] via SUBCUTANEOUS
  Filled 2018-06-07: qty 10

## 2018-06-07 MED ORDER — PREDNISONE 20 MG PO TABS: ORAL_TABLET | ORAL | 0 refills | Status: DC

## 2018-06-07 NOTE — Discharge Summary (Signed)
Physician Discharge Summary  Rhonda Hickman YBW:[id number] DOB: [birth year] DOA: 06/06/2018  PCP: [name] [employer] date: 76/81/1572 Discharge date: 06/07/2018  Time spent: 35 minutes  Recommendations for Outpatient Follow-up:  1. Repeat basic metabolic panel to follow electrolytes and renal function 2. Reassess blood pressure to further adjust antihypertensive regimen 3. Close follow-up to patient's CBGs and A1c to further adjust hypoglycemic regimen as needed. 4. Recommend outpatient follow-up with pulmonology service to further assess COPD/asthma and repeat PFTs. 5. Repeat chest x-ray in 4-6 weeks to ensure resolution of infiltrates.   Discharge Diagnoses:  Active Problems:   DM (diabetes mellitus) (Rhonda Hickman)   Cocaine abuse (Rhonda Hickman)   Extrinsic asthma   COPD with acute exacerbation (Rhonda Hickman)   Asthma exacerbation   Bronchiectasis with acute exacerbation (HCC)   Obesity, Class III, BMI 40-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) (Rhonda Hickman)   Gastroesophageal reflux disease   Discharge Condition: Stable and improved.  Discharge home with instruction to follow-up with PCP in 2 weeks.  Diet recommendation: Heart healthy, modified carbohydrate and low-calorie diet.  Filed Weights   06/06/18 1048  Weight: (!) 136.9 kg    History of present illness:  Patient be written by Dr. [name] on 06/06/2018 62 y.o. female with medical history significant COPD, Asthma, DM, cocaine abuse, who presented to the ED with complaints of shortness of breath and cough of 2 weeks duration.  Cough is productive of thick dark brown/greenish sputum.  Reports subjective fevers.  Reports chest tightness and wheezing.  Also reports chest pain right-sided yesterday stabbing lasting ~9mins, nonradiating woke up from sleep, self resolved. Patient currently smokes 3PPD.  Reports last use of cocaine and alcohol intake was ~4 years ago.  History of heart attack in her sister at age 53 requiring stent.  No personal or family  history of blood clots  ED Course: Initial regular heart rates, initial tachypnea to 25.  O2 sats 91% on room air dropped to 89% with ambulation.  Two-view chest x-ray right upper lobe pneumonia.  WBC 7.4.  Patient started on IV ceftriaxone and azithromycin.  1 L bolus given. While in the ED it was noted that patient was in atrial fibrillation, with rates in the 120s.  And had just completed 1 hr neb treatments and had come back from the bathroom.  EKG was done read as A. fib by device, rate 127 -but I do see some P waves, and rhythm appears mostly regular.   Hospital Course:  1-asthma/COPD exacerbation/bronchiectasis -Patient is afebrile, chest x-ray demonstrated pneumonia. -Significant improvement with the use of nebulizer and a steroids -Continue antibiotic therapy -Good oxygen saturation on room air and just complaining of intermittent coughing spells -Patient discharged on a steroids tapering, oral antibiotics, a Spiriva and Advair -Continue as needed albuterol. -Will benefit of outpatient follow-up with pulmonology for PFTs.  2-pneumonia right upper lobe and left bases -Afebrile with good oxygen saturation on room air -Speaking in full sentences with normal respiratory rate -Patient has been discharged on oral antibiotics to complete treatment -Please repeat x-ray in 4 to 6 weeks to ensure resolution of infiltrates.  3-chest discomfort -Troponin x3- -Patient denies any further chest pain -Most likely associated with COPD and pneumonia -Outpatient 2-echo and further decision on stratification -Continue risk factors modification.  4-GERD -discharge on PPI daily  5-type 2 diabetes with hyperglycemia -Continue insulin therapy -Patient advised to follow modified carbohydrate to maintain adequate hydration -Outpatient follow-up of her CBGs and A1c to further adjust hypoglycemic regimen as needed.  6-morbid  obesity -Body mass index is 40.93 kg/m. -Low calorie diet, portion  control and improve physical activity has been recommended  7-HTN -Stable and overall well controlled -Continue home antihypertensive regimen -Patient advised to follow heart healthy diet.  8-depression -No suicidal ideation or hallucination -Mood overall stable -Continue Celexa and Depakote -Outpatient follow-up with psychiatry service.  Procedures:  See below for x-ray reports.  Consultations:  None  Discharge Exam: Vitals:   06/07/18 0750 06/07/18 0754  BP:    Pulse:    Resp:    Temp:    SpO2: 96% 96%    General: Afebrile, no chest pain, able to speak in full sentences and with good oxygen saturation on room air.  Still with expiratory wheezing and intermittent nonproductive coughing spells.  Patient denies chest pain. Cardiovascular: S1 and S2, no rubs, no gallops, no murmurs Respiratory: Positive expiratory wheezing, improved air movement bilaterally, no using accessory muscles, no crackles. Abdomen: Soft, obese, nontender, nondistended, positive bowel sounds. Extremities: No edema, no cyanosis, no clubbing  Discharge Instructions   Discharge Instructions    Diet - low sodium heart healthy   Complete by:  As directed    Diet Carb Modified   Complete by:  As directed    Discharge instructions   Complete by:  As directed    Maintained adequate hydration Take medication as prescribed Avoid secondhand smoking and keep yourself smoking free. Arrange follow-up with PCP in 10 days     Allergies as of 06/07/2018      Reactions   Aspirin Nausea Only, Other (See Comments)   Causes stomach pain   Ibuprofen    Stomach problems      Medication List    TAKE these medications   albuterol (2.5 MG/3ML) 0.083% nebulizer solution Commonly known as:  PROVENTIL Take 2.5 mg by nebulization every 6 (six) hours as needed for shortness of breath. What changed:  Another medication with the same name was changed. Make sure you understand how and when to take each.    albuterol 108 (90 Base) MCG/ACT inhaler Commonly known as:  PROVENTIL HFA;VENTOLIN HFA Inhale 2 puffs into the lungs every 4 (four) hours as needed. What changed:  reasons to take this   amLODipine 5 MG tablet Commonly known as:  NORVASC Take 5 mg by mouth daily.   amoxicillin-clavulanate 875-125 MG tablet Commonly known as:  AUGMENTIN Take 1 tablet by mouth every 12 (twelve) hours for 7 days.   benzonatate 100 MG capsule Commonly known as:  TESSALON Take 1 capsule (100 mg total) by mouth 3 (three) times daily as needed (uncontrolled coughing spells).   citalopram 20 MG tablet Commonly known as:  CELEXA Take 20 mg by mouth daily.   DEPAKOTE ER 500 MG 24 hr tablet Generic drug:  divalproex Take 500-1,000 mg by mouth See admin instructions. 500MG  in the morning and 1000mg  at bedtime   dextromethorphan-guaiFENesin 30-600 MG 12hr tablet Commonly known as:  MUCINEX DM Take 1 tablet by mouth 2 (two) times daily.   diclofenac 75 MG EC tablet Commonly known as:  VOLTAREN Take 1 tablet (75 mg total) by mouth 2 (two) times daily. What changed:  Another medication with the same name was removed. Continue taking this medication, and follow the directions you see here.   Fluticasone-Salmeterol 250-50 MCG/DOSE Aepb Commonly known as:  ADVAIR Inhale 1 puff into the lungs 2 (two) times daily.   gabapentin 300 MG capsule Commonly known as:  NEURONTIN Take 3 capsules (900 mg total)  by mouth 3 (three) times daily.   insulin NPH-regular Human (70-30) 100 UNIT/ML injection Inject 50 Units into the skin 3 (three) times daily.   lisinopril 5 MG tablet Commonly known as:  PRINIVIL,ZESTRIL Take 5 mg by mouth daily.   oxybutynin 5 MG tablet Commonly known as:  DITROPAN Take 5 mg by mouth 2 (two) times daily.   pantoprazole 40 MG tablet Commonly known as:  PROTONIX Take 1 tablet (40 mg total) by mouth daily.   predniSONE 20 MG tablet Commonly known as:  DELTASONE Take 3 tablets by  mouth daily x1 day; then 2 tablets by mouth daily x2 days; then 1 tablet by mouth daily x2 days; then half tablet by mouth daily x3 days and stop prednisone.   tiotropium 18 MCG inhalation capsule Commonly known as:  SPIRIVA Place 1 capsule (18 mcg total) into inhaler and inhale daily.   traMADol 50 MG tablet Commonly known as:  ULTRAM Take 1 tablet (50 mg total) by mouth every 6 (six) hours as needed for severe pain. What changed:    how much to take  how to take this  when to take this  reasons to take this  additional instructions   traZODone 100 MG tablet Commonly known as:  DESYREL Take 200 mg by mouth at bedtime as needed for sleep.      Allergies  Allergen Reactions  . Aspirin Nausea Only and Other (See Comments)    Causes stomach pain  . Ibuprofen     Stomach problems   Follow-up Information    [facility], [facility]. Schedule an appointment as soon as possible for a visit in 10 day(s).   Contact information: [address] [place] [address] [phone]           The results of significant diagnostics from this hospitalization (including imaging, microbiology, ancillary and laboratory) are listed below for reference.    Significant Diagnostic Studies: Dg Chest 2 View  Result Date: 06/06/2018 CLINICAL DATA:  Shortness of breath EXAM: CHEST - 2 VIEW COMPARISON:  June 04, 2018 and August 19, 2017 FINDINGS: There is persistent perihilar opacity on the right with infiltrate in a portion of the anterior segment of the right upper lobe inferiorly, slightly less prominent than 2 days prior. There is also a small area of infiltrate in the posterior left base. Lungs elsewhere are clear. Heart size and pulmonary vascularity are normal. No adenopathy. No bone lesions. IMPRESSION: Infiltrate consistent with pneumonia in the anterior segment of the right upper lobe inferiorly and in the posterior left base regions. Lungs elsewhere clear. Heart size  normal. No adenopathy evident. Followup PA and lateral chest radiographs recommended in 3-4 weeks following trial of antibiotic therapy to ensure resolution and exclude underlying malignancy. Electronically Signed   By: Lowella Grip III M.D.   On: 06/06/2018 11:33   Dg Chest 2 View  Result Date: 06/04/2018 CLINICAL DATA:  62 y/o F; 1 week of cough and shortness of breath. History of asthma and COPD. EXAM: CHEST - 2 VIEW COMPARISON:  08/19/2017 chest radiograph FINDINGS: Right perihilar and left lower lobe patchy consolidations. Stable normal cardiac silhouette given projection and technique. No pleural effusion or pneumothorax. Bones are unremarkable. IMPRESSION: Right perihilar and left lower lobe patchy consolidations, likely pneumonia. Electronically Signed   By: Kristine Garbe M.D.   On: 06/04/2018 15:21   Labs: Basic Metabolic Panel: Recent Labs  Lab 06/06/18 1129  NA 135  K 4.3  CL 101  CO2 24  GLUCOSE 285*  BUN 9  CREATININE 0.88  CALCIUM 8.9   CBC: Recent Labs  Lab 06/06/18 1129  WBC 7.4  NEUTROABS 3.1  HGB 13.0  HCT 40.9  MCV 88.7  PLT 268   Cardiac Enzymes: Recent Labs  Lab 06/06/18 1923 06/07/18 0038 06/07/18 0640  TROPONINI <0.03 <0.03 <0.03   CBG: Recent Labs  Lab 06/06/18 1114 06/06/18 2133 06/07/18 0733 06/07/18 1145  GLUCAP 309* 407* 407* 286*    Signed:  Barton Dubois MD.  [facility] 06/07/2018, 1:11 PM

## 2018-06-07 NOTE — Progress Notes (Signed)
Patient's blood sugar 407, MD informed and orders given.

## 2018-06-07 NOTE — Progress Notes (Unsigned)
Echocardiogram attempted- patient w/ respiratory.Will attempt echo again 06/08/18.

## 2018-06-08 LAB — HIV ANTIBODY (ROUTINE TESTING W REFLEX): HIV SCREEN 4TH GENERATION: NONREACTIVE

## 2018-06-08 LAB — HEMOGLOBIN A1C
Hgb A1c MFr Bld: 11.5 % — ABNORMAL HIGH (ref 4.8–5.6)
Mean Plasma Glucose: 283 mg/dL

## 2018-06-12 ENCOUNTER — Other Ambulatory Visit: Payer: Self-pay

## 2018-07-09 ENCOUNTER — Inpatient Hospital Stay: Admission: RE | Admit: 2018-07-09 | Payer: Self-pay | Source: Ambulatory Visit

## 2018-07-18 ENCOUNTER — Other Ambulatory Visit: Payer: Self-pay

## 2018-07-20 ENCOUNTER — Ambulatory Visit
Admission: RE | Admit: 2018-07-20 | Discharge: 2018-07-20 | Disposition: A | Discharge status: Home / Self Care | Payer: Self-pay | Source: Ambulatory Visit | Attending: Orthopedic Surgery | Admitting: Orthopedic Surgery

## 2018-07-20 DIAGNOSIS — M5410 Radiculopathy, site unspecified: Secondary | ICD-10-CM

## 2018-07-20 MED ORDER — METHYLPREDNISOLONE ACETATE 40 MG/ML INJ SUSP (RADIOLOG
120.0000 mg | Freq: Once | INTRAMUSCULAR | Status: AC
  Administered 2018-07-20: 120 mg via EPIDURAL

## 2018-07-20 MED ORDER — IOPAMIDOL (ISOVUE-M 200) INJECTION 41%
1.0000 mL | Freq: Once | INTRAMUSCULAR | Status: AC
  Administered 2018-07-20: 1 mL via EPIDURAL

## 2018-07-20 NOTE — Discharge Instructions (Signed)

## 2018-08-14 ENCOUNTER — Telehealth: Payer: Self-pay | Admitting: Neurology

## 2018-08-14 NOTE — Telephone Encounter (Signed)
LMTC.  Dr. Krista Blue will be happy to discuss other tx. options for pt., but she has not been seen since 08/29/17. Will need appt.  When she calls back, please offer next available/fim

## 2018-08-14 NOTE — Telephone Encounter (Signed)
Pt is unable to get gabapentin now thru the [facility] program. She is wanting to know what she could take instead. Please call to advise

## 2018-08-23 NOTE — Telephone Encounter (Signed)
error 

## 2018-08-29 ENCOUNTER — Ambulatory Visit (INDEPENDENT_AMBULATORY_CARE_PROVIDER_SITE_OTHER): Payer: Self-pay

## 2018-08-29 ENCOUNTER — Ambulatory Visit (INDEPENDENT_AMBULATORY_CARE_PROVIDER_SITE_OTHER): Payer: Self-pay | Admitting: Orthopedic Surgery

## 2018-08-29 VITALS — BP 104/66 | HR 83 | Ht 72.0 in | Wt 300.0 lb

## 2018-08-29 DIAGNOSIS — G8929 Other chronic pain: Secondary | ICD-10-CM

## 2018-08-29 DIAGNOSIS — Z9889 Other specified postprocedural states: Secondary | ICD-10-CM

## 2018-08-29 DIAGNOSIS — M5410 Radiculopathy, site unspecified: Secondary | ICD-10-CM

## 2018-08-29 DIAGNOSIS — M25561 Pain in right knee: Secondary | ICD-10-CM

## 2018-08-29 MED ORDER — TRAMADOL HCL 50 MG PO TABS: 50.0000 mg | ORAL_TABLET | Freq: Four times a day (QID) | ORAL | 0 refills | Status: DC | PRN

## 2018-08-29 NOTE — Progress Notes (Signed)
Chief Complaint  Patient presents with  . Knee Pain    Recheck on back and right knee after ESI.  . 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  Gastrointestinal: Negative.   Genitourinary: Negative.   Musculoskeletal: Positive for back pain.  Skin: Negative.   Neurological: Positive for focal weakness.   Past Medical History:  Diagnosis Date  . Anxiety   . Asthma   . Bipolar 1 disorder ([place])   . Bulging lumbar disc   . Chronic pain of left knee   . COPD (chronic obstructive pulmonary disease) ([place])   . Diabetes mellitus   . Neuropathy     BP 104/66   Pulse 83   Ht 6' (1.829 m)   Wt 300 lb (136.1 kg)   BMI 40.69 kg/m  Physical Exam Vitals signs reviewed.  Constitutional:      Appearance: Normal appearance. She is well-developed.  Neurological:     Mental Status: She is alert and oriented to person, place, and time.  Psychiatric:        Attention and Perception: Attention normal.        Mood and Affect: Mood and affect normal.        Speech: Speech normal.         Behavior: Behavior normal.        Thought Content: Thought content normal.        Judgment: Judgment normal.    Right knee tenderness on the medial joint line no swelling range of motion is 5-1 25 passive knee feels stable McMurray sign is negative muscle tone is normal  No peripheral edema  Skin is warm dry and intact  No sensory abnormalities  Left knee no tenderness no instability normal muscle tone  X-ray right knee: X-ray shows normal alignment of the knee but moderate arthritis with joint space narrowing 75% of the medial side and osteophytes noted around the patellofemoral joint  Encounter Diagnoses  Name Primary?  . Chronic pain of right knee Yes  . S/P right knee arthroscopy   . Radicular pain of right lower extremity     We also recommend repeat epidural injection of the lumbar spine  Procedure note right knee injection verbal consent was obtained to inject right knee joint  Timeout was completed to confirm the site of injection  The medications used were 40 mg of Depo-Medrol and 1% lidocaine 3 cc  Anesthesia was provided by ethyl chloride and the skin was prepped with alcohol.  After cleaning the skin with alcohol a 20-gauge needle was used to inject the right knee joint. There were  no complications. A sterile bandage was applied.   Return as needed

## 2018-09-03 ENCOUNTER — Other Ambulatory Visit: Payer: Self-pay | Admitting: Orthopedic Surgery

## 2018-09-03 DIAGNOSIS — M545 Low back pain, unspecified: Secondary | ICD-10-CM

## 2018-09-03 DIAGNOSIS — G8929 Other chronic pain: Secondary | ICD-10-CM

## 2018-09-07 ENCOUNTER — Ambulatory Visit
Admission: RE | Admit: 2018-09-07 | Discharge: 2018-09-07 | Disposition: A | Discharge status: Home / Self Care | Payer: No Typology Code available for payment source | Source: Ambulatory Visit | Attending: Orthopedic Surgery | Admitting: Orthopedic Surgery

## 2018-09-07 DIAGNOSIS — G8929 Other chronic pain: Secondary | ICD-10-CM

## 2018-09-07 DIAGNOSIS — M545 Low back pain, unspecified: Secondary | ICD-10-CM

## 2018-09-07 MED ORDER — IOPAMIDOL (ISOVUE-M 200) INJECTION 41%
1.0000 mL | Freq: Once | INTRAMUSCULAR | Status: AC
  Administered 2018-09-07: 1 mL via EPIDURAL

## 2018-09-07 MED ORDER — METHYLPREDNISOLONE ACETATE 40 MG/ML INJ SUSP (RADIOLOG
120.0000 mg | Freq: Once | INTRAMUSCULAR | Status: AC
  Administered 2018-09-07: 120 mg via EPIDURAL

## 2018-09-07 NOTE — Discharge Instructions (Signed)

## 2018-09-12 ENCOUNTER — Ambulatory Visit (INDEPENDENT_AMBULATORY_CARE_PROVIDER_SITE_OTHER): Payer: Self-pay | Admitting: Neurology

## 2018-09-12 ENCOUNTER — Encounter: Payer: Self-pay | Admitting: Neurology

## 2018-09-12 VITALS — BP 125/78 | HR 65 | Ht 72.0 in | Wt 293.0 lb

## 2018-09-12 DIAGNOSIS — R202 Paresthesia of skin: Secondary | ICD-10-CM

## 2018-09-12 MED ORDER — DULOXETINE HCL 30 MG PO CPEP: 30.0000 mg | ORAL_CAPSULE | Freq: Every day | ORAL | 3 refills | Status: DC

## 2018-09-12 MED ORDER — GABAPENTIN 300 MG PO CAPS: 900.0000 mg | ORAL_CAPSULE | Freq: Three times a day (TID) | ORAL | 5 refills | Status: DC

## 2018-09-12 NOTE — Progress Notes (Signed)
PATIENT: Rhonda Hickman DOB: 1956/01/04  REASON FOR VISIT: follow up HISTORY FROM: patient  HISTORY OF PRESENT ILLNESS: Today 09/12/18  HISTORY  Rhonda Hickman is a 63 year old female, seen in refer by orthopedic surgeon Dr. Arther Abbott for evaluation of abnormal sensation in her right leg, her primary care physician is at health, [facility], initial evaluation was on August 29, 2017.  She has past medical history of hypertension, bipolar disorder, diabetes, insulin-dependent  On April 16, 2016, she had right knee arthroscopic surgery with medial and the lateral meniscectomy, microfracture repair by Dr. Aline Brochure.    Shortly afterwards, she began to experience bilateral upper and lower extremity numbness tingling involving right leg more, like water running down her legs, burning sensation across her shoulders, paresthesia from neck down, she often described as burning rising sensation, she also has chronic neck pain, low back pain, frequent urinary incontinence since 2018, use 24 pull-ups each day,  She has significant gait abnormality due to bilateral knee pain,  Update September 12, 2018 SS: Evaluated in February 2019 for paresthesia and neck down, chronic neck, low back pain, gait abnormality, urinary incontinence.MRI cervical spine in February 2019 showed mild degenerative disease, no evidence of canal stenosis.  MRI lumbar spine showed multiple level degenerative disease, with mild foraminal stenosis at different levels.  Physical therapy was reordered for the patient at [facility] health outpatient rehab center in [facility]. Was taking gabapentin 900 mg 3 times daily.  She reports difficulty obtaining gabapentin through medication assist program.   She reports continued neck back and right leg pain.  She reports a feeling of burning all over her back and down her right leg.  She reports she had an epidural injection in January and February 2020 7 by her  orthopedic doctor, Aline Brochure.  She reports no benefit from this.  She reports continued feelings of off balance.  She recently got a steroid injection in her right knee and reports she is probably can have to have a knee replacement.  She reports lifting hurts her back even just taking the trash as a trash can.  She is requesting a refill on her gabapentin.  She reports it does help whenever she remembers to take it.  She has completed her physical therapy and reports it was not beneficial.  She currently does not work she reports she is trying to get disability.  She reports several months since her last fall.  She reports that she has [facility] financial assistance that expires March 16.  She does not see a primary care provider.  She reports she is not driving a car.  She presents today for follow-up unaccompanied.   REVIEW OF SYSTEMS: Out of a complete 14 system review of symptoms, the patient complains only of the following symptoms, and all other reviewed systems are negative.  Cough, wheezing, chest tightness, chest pain, muscle cramps, walking difficulty, dizziness, headache, numbness  ALLERGIES: Allergies  Allergen Reactions  . Aspirin Nausea Only and Other (See Comments)    Causes stomach pain  . Ibuprofen Other (See Comments)    Stomach problems    HOME MEDICATIONS: Outpatient Medications Prior to Visit  Medication Sig Dispense Refill  . albuterol (PROVENTIL HFA;VENTOLIN HFA) 108 (90 Base) MCG/ACT inhaler Inhale 2 puffs into the lungs every 4 (four) hours as needed. (Patient taking differently: Inhale 2 puffs into the lungs every 4 (four) hours as needed for wheezing. ) 6.7 g 0  . albuterol (PROVENTIL) (2.5 MG/3ML) 0.083%  nebulizer solution Take 2.5 mg by nebulization every 6 (six) hours as needed for shortness of breath.     Marland Kitchen amLODipine (NORVASC) 5 MG tablet Take 5 mg by mouth daily.    . benzonatate (TESSALON) 100 MG capsule Take 1 capsule (100 mg total) by mouth 3 (three) times daily  as needed (uncontrolled coughing spells). 20 capsule 0  . citalopram (CELEXA) 20 MG tablet Take 20 mg by mouth daily.    Marland Kitchen dextromethorphan-guaiFENesin (MUCINEX DM) 30-600 MG 12hr tablet Take 1 tablet by mouth 2 (two) times daily. 40 tablet 0  . diclofenac (VOLTAREN) 75 MG EC tablet Take 1 tablet (75 mg total) by mouth 2 (two) times daily. 60 tablet 5  . divalproex (DEPAKOTE ER) 500 MG 24 hr tablet Take 500-1,000 mg by mouth See admin instructions. 500MG  in the morning and 1000mg  at bedtime    . Fluticasone-Salmeterol (ADVAIR DISKUS) 250-50 MCG/DOSE AEPB Inhale 1 puff into the lungs 2 (two) times daily. 60 each 3  . gabapentin (NEURONTIN) 300 MG capsule Take 3 capsules (900 mg total) by mouth 3 (three) times daily. 270 capsule 11  . insulin NPH-regular (NOVOLIN 70/30) (70-30) 100 UNIT/ML injection Inject 50 Units into the skin 3 (three) times daily.     Marland Kitchen lisinopril (PRINIVIL,ZESTRIL) 5 MG tablet Take 5 mg by mouth daily.    Marland Kitchen oxybutynin (DITROPAN) 5 MG tablet Take 5 mg by mouth 2 (two) times daily.    . pantoprazole (PROTONIX) 40 MG tablet Take 1 tablet (40 mg total) by mouth daily. 30 tablet 1  . predniSONE (DELTASONE) 20 MG tablet Take 3 tablets by mouth daily x1 day; then 2 tablets by mouth daily x2 days; then 1 tablet by mouth daily x2 days; then half tablet by mouth daily x3 days and stop prednisone. 12 tablet 0  . tiotropium (SPIRIVA HANDIHALER) 18 MCG inhalation capsule Place 1 capsule (18 mcg total) into inhaler and inhale daily. 30 capsule 3  . traMADol (ULTRAM) 50 MG tablet Take 1 tablet (50 mg total) by mouth every 6 (six) hours as needed for severe pain. 60 tablet 0  . traZODone (DESYREL) 100 MG tablet Take 200 mg by mouth at bedtime as needed for sleep.      No facility-administered medications prior to visit.     PAST MEDICAL HISTORY: Past Medical History:  Diagnosis Date  . Anxiety   . Asthma   . Bipolar 1 disorder ([place])   . Bulging lumbar disc   . Chronic pain of left knee    . COPD (chronic obstructive pulmonary disease) ([place])   . Diabetes mellitus   . Neuropathy     PAST SURGICAL HISTORY: Past Surgical History:  Procedure Laterality Date  . ABDOMINAL HYSTERECTOMY    . CARPAL TUNNEL RELEASE Right   . KNEE ARTHROSCOPY WITH MEDIAL MENISECTOMY Right 04/14/2016   Procedure: KNEE ARTHROSCOPY WITH MEDIAL AND LATERAL MENISECTOMY, MICROFRACTURE REPAIR;  Surgeon: Carole Civil, MD;  Location: [facility];  Service: Orthopedics;  Laterality: Right;  lateral menisectomy - needs crutch training    FAMILY HISTORY: Family History  Problem Relation Age of Onset  . Diabetes Mother   . Heart attack Father     SOCIAL HISTORY: Social History   Socioeconomic History  . Marital status: Single    Spouse name: Not on file  . Number of children: 0  . Years of education: 63  . Highest education level: High school graduate  Occupational History  . Occupation: Unemployed  Social  Needs  . Financial resource strain: Not on file  . Food insecurity:    Worry: Not on file    Inability: Not on file  . Transportation needs:    Medical: Not on file    Non-medical: Not on file  Tobacco Use  . Smoking status: Current Some Day Smoker    Packs/day: 0.25    Types: Cigarettes  . Smokeless tobacco: Never Used  . Tobacco comment: trying to cut down  Substance and Sexual Activity  . Alcohol use: No  . Drug use: No  . Sexual activity: Never  Lifestyle  . Physical activity:    Days per week: Not on file    Minutes per session: Not on file  . Stress: Not on file  Relationships  . Social connections:    Talks on phone: Not on file    Gets together: Not on file    Attends religious service: Not on file    Active member of club or organization: Not on file    Attends meetings of clubs or organizations: Not on file    Relationship status: Not on file  . Intimate partner violence:    Fear of current or ex partner: Not on file    Emotionally abused: Not on file     Physically abused: Not on file    Forced sexual activity: Not on file  Other Topics Concern  . Not on file  Social History Narrative   Lives at home alone.   Right-handed.   No caffeine use.      PHYSICAL EXAM  There were no vitals filed for this visit. There is no height or weight on file to calculate BMI.  Generalized: Well developed, in no acute distress   Neurological examination  Mentation: Alert oriented to time, place, history taking. Follows all commands speech and language fluent Cranial nerve II-XII: Pupils were equal round reactive to light. Extraocular movements were full, visual field were full on confrontational test. Facial sensation and strength were normal. Uvula tongue midline. Head turning and shoulder shrug  were normal and symmetric. Motor: The motor testing reveals 5 over 5 strength of all 4 extremities. Good symmetric motor tone is noted throughout.  Sensory: Sensory testing is intact to soft touch on all 4 extremities. No evidence of extinction is noted.  Coordination: Cerebellar testing reveals good finger-nose-finger and heel-to-shin bilaterally.  Gait and station: Gait is mildly unsteady, right knee hesitancy. tandem gait is unsteady.  Difficulty walking on heels, no trouble with walking tiptoe.  Reflexes: Deep tendon reflexes are symmetric and normal bilaterally.   DIAGNOSTIC DATA (LABS, IMAGING, TESTING) - I reviewed patient records, labs, notes, testing and imaging myself where available.  Lab Results  Component Value Date   WBC 7.4 06/06/2018   HGB 13.0 06/06/2018   HCT 40.9 06/06/2018   MCV 88.7 06/06/2018   PLT 268 06/06/2018      Component Value Date/Time   NA 135 06/06/2018 1129   K 4.3 06/06/2018 1129   CL 101 06/06/2018 1129   CO2 24 06/06/2018 1129   GLUCOSE 285 (H) 06/06/2018 1129   BUN 9 06/06/2018 1129   CREATININE 0.88 06/06/2018 1129   CALCIUM 8.9 06/06/2018 1129   PROT 8.0 02/11/2014 0925   ALBUMIN 3.8 02/11/2014 0925    AST 23 02/11/2014 0925   ALT 21 02/11/2014 0925   ALKPHOS 87 02/11/2014 0925   BILITOT 0.2 (L) 02/11/2014 0925   GFRNONAA >60 06/06/2018 1129   GFRAA >60  06/06/2018 1129   No results found for: CHOL, HDL, LDLCALC, LDLDIRECT, TRIG, CHOLHDL Lab Results  Component Value Date   HGBA1C 11.5 (H) 06/06/2018   No results found for: DDUKGURK27 Lab Results  Component Value Date   TSH 0.454 06/06/2018      ASSESSMENT AND PLAN 63 y.o. year old female  has a past medical history of Anxiety, Asthma, Bipolar 1 disorder ([place]), Bulging lumbar disc, Chronic pain of left knee, COPD (chronic obstructive pulmonary disease) ([place]), Diabetes mellitus, and Neuropathy. here with:  1.  Paresthesia, pain, burning from neck, back down right leg  She recently had 2 epidural injections in January and February 2020.  She reports no benefit with these.  She is requesting a refill on her gabapentin and this was sent to her pharmacy.  I discussed with Dr. Krista Blue and we reviewed the MRI films.  We will start Cymbalta 30 mg daily.  A referral for primary care was given to the [facility] health wellness center.  She does have [facility] financial assistance until September 24, 2018.  She will follow-up in this office on an as-needed basis.  She will maintain close follow-up with her orthopedic doctor Dr. Aline Brochure.   I spent 15 minutes with the patient. 50% of this time was spent discussing her plan of care   Evangeline Dakin, DNP 09/12/2018, 7:57 AM [facility] [address], [place], [place] [address] [phone]

## 2018-09-17 ENCOUNTER — Other Ambulatory Visit (HOSPITAL_COMMUNITY): Payer: Self-pay | Admitting: *Deleted

## 2018-09-17 ENCOUNTER — Other Ambulatory Visit: Payer: Self-pay | Admitting: *Deleted

## 2018-09-17 DIAGNOSIS — D171 Benign lipomatous neoplasm of skin and subcutaneous tissue of trunk: Secondary | ICD-10-CM

## 2018-09-19 NOTE — Progress Notes (Signed)
I have reviewed and agreed above plan. 

## 2018-09-21 ENCOUNTER — Other Ambulatory Visit: Payer: Self-pay

## 2018-09-21 ENCOUNTER — Other Ambulatory Visit (HOSPITAL_COMMUNITY): Payer: Self-pay | Admitting: *Deleted

## 2018-09-21 ENCOUNTER — Ambulatory Visit (HOSPITAL_COMMUNITY)
Admission: RE | Admit: 2018-09-21 | Discharge: 2018-09-21 | Disposition: A | Discharge status: Home / Self Care | Payer: Self-pay | Source: Ambulatory Visit | Attending: *Deleted | Admitting: *Deleted

## 2018-09-21 DIAGNOSIS — D171 Benign lipomatous neoplasm of skin and subcutaneous tissue of trunk: Secondary | ICD-10-CM | POA: Insufficient documentation

## 2018-10-24 ENCOUNTER — Other Ambulatory Visit: Payer: Self-pay | Admitting: Neurology

## 2018-10-24 NOTE — Telephone Encounter (Signed)
Pharmacy called in for a 2 week supply of gabapentin (NEURONTIN) 300 MG capsule and citalopram (CELEXA) 20 MG tablet to be sent to [facility]

## 2018-10-25 NOTE — Telephone Encounter (Signed)
Spoke with the patient and stated that she is currently in [facility] and she wont be home until to the middle of next month. She stated that she needs medication to hold her over until she gets home. I advised her that we didn't feel her Celexa and that she may need to call her PCP. She was very appreciative for the call.

## 2018-10-26 ENCOUNTER — Encounter: Payer: Self-pay | Admitting: Neurology

## 2018-10-26 ENCOUNTER — Telehealth: Payer: Self-pay | Admitting: Neurology

## 2018-10-26 MED ORDER — GABAPENTIN 300 MG PO CAPS: 900.0000 mg | ORAL_CAPSULE | Freq: Three times a day (TID) | ORAL | 0 refills | Status: DC

## 2018-10-26 NOTE — Telephone Encounter (Signed)
Patient asked for gabapentin to be sent to a different pharmacy --- [facility] in [place]

## 2018-12-06 ENCOUNTER — Ambulatory Visit: Payer: No Typology Code available for payment source | Admitting: Family Medicine

## 2018-12-11 ENCOUNTER — Ambulatory Visit: Payer: No Typology Code available for payment source | Admitting: Family Medicine

## 2018-12-17 ENCOUNTER — Ambulatory Visit: Payer: No Typology Code available for payment source | Admitting: Family Medicine

## 2018-12-27 ENCOUNTER — Other Ambulatory Visit: Payer: Self-pay | Admitting: *Deleted

## 2018-12-27 DIAGNOSIS — Z20822 Contact with and (suspected) exposure to covid-19: Secondary | ICD-10-CM

## 2018-12-31 LAB — SPECIMEN STATUS REPORT

## 2018-12-31 LAB — NOVEL CORONAVIRUS, NAA: SARS-CoV-2, NAA: NOT DETECTED

## 2019-01-01 ENCOUNTER — Telehealth: Payer: Self-pay | Admitting: Hematology

## 2019-01-01 NOTE — Telephone Encounter (Signed)
Pt is aware covid 19  Test is negative

## 2019-01-10 DIAGNOSIS — Z0271 Encounter for disability determination: Secondary | ICD-10-CM

## 2019-01-18 ENCOUNTER — Other Ambulatory Visit: Payer: No Typology Code available for payment source

## 2019-01-18 ENCOUNTER — Other Ambulatory Visit: Payer: Self-pay

## 2019-01-18 DIAGNOSIS — Z20822 Contact with and (suspected) exposure to covid-19: Secondary | ICD-10-CM

## 2019-01-25 LAB — NOVEL CORONAVIRUS, NAA: SARS-CoV-2, NAA: NOT DETECTED

## 2019-01-30 ENCOUNTER — Emergency Department (HOSPITAL_COMMUNITY)
Admission: EM | Admit: 2019-01-30 | Discharge: 2019-01-30 | Disposition: A | Discharge status: Home / Self Care | Payer: No Typology Code available for payment source | Attending: Emergency Medicine | Admitting: Emergency Medicine

## 2019-01-30 ENCOUNTER — Other Ambulatory Visit: Payer: Self-pay

## 2019-01-30 ENCOUNTER — Encounter (HOSPITAL_COMMUNITY): Payer: Self-pay | Admitting: Emergency Medicine

## 2019-01-30 ENCOUNTER — Other Ambulatory Visit: Payer: Self-pay | Admitting: Orthopedic Surgery

## 2019-01-30 DIAGNOSIS — M1712 Unilateral primary osteoarthritis, left knee: Secondary | ICD-10-CM

## 2019-01-30 DIAGNOSIS — M25561 Pain in right knee: Secondary | ICD-10-CM

## 2019-01-30 DIAGNOSIS — G8929 Other chronic pain: Secondary | ICD-10-CM

## 2019-01-30 DIAGNOSIS — R197 Diarrhea, unspecified: Secondary | ICD-10-CM | POA: Insufficient documentation

## 2019-01-30 DIAGNOSIS — Z5321 Procedure and treatment not carried out due to patient leaving prior to being seen by health care provider: Secondary | ICD-10-CM | POA: Insufficient documentation

## 2019-01-30 MED ORDER — SODIUM CHLORIDE 0.9% FLUSH: 3.0000 mL | Freq: Once | INTRAVENOUS | Status: DC

## 2019-01-30 NOTE — ED Triage Notes (Signed)
Patient reports diarrhea and emesis x 2 weeks. C/o headache. Awaiting COVID testing results.

## 2019-03-08 ENCOUNTER — Other Ambulatory Visit: Payer: Self-pay | Admitting: *Deleted

## 2019-03-08 DIAGNOSIS — Z20822 Contact with and (suspected) exposure to covid-19: Secondary | ICD-10-CM

## 2019-03-09 LAB — NOVEL CORONAVIRUS, NAA: SARS-CoV-2, NAA: NOT DETECTED

## 2019-03-12 ENCOUNTER — Telehealth: Payer: Self-pay

## 2019-03-12 NOTE — Telephone Encounter (Signed)
Patient called in requesting results be faxed to [phone], attention triage nurse. Please advise.

## 2019-03-27 ENCOUNTER — Ambulatory Visit: Payer: Self-pay | Admitting: Orthopedic Surgery

## 2019-04-03 ENCOUNTER — Ambulatory Visit (INDEPENDENT_AMBULATORY_CARE_PROVIDER_SITE_OTHER): Payer: Self-pay | Admitting: Orthopedic Surgery

## 2019-04-03 ENCOUNTER — Ambulatory Visit: Payer: Self-pay

## 2019-04-03 ENCOUNTER — Telehealth: Payer: Self-pay | Admitting: Orthopedic Surgery

## 2019-04-03 ENCOUNTER — Other Ambulatory Visit: Payer: Self-pay

## 2019-04-03 VITALS — BP 110/68 | HR 85 | Temp 97.0°F | Ht 71.0 in | Wt 295.0 lb

## 2019-04-03 DIAGNOSIS — G8929 Other chronic pain: Secondary | ICD-10-CM

## 2019-04-03 DIAGNOSIS — M5136 Other intervertebral disc degeneration, lumbar region: Secondary | ICD-10-CM

## 2019-04-03 DIAGNOSIS — M1710 Unilateral primary osteoarthritis, unspecified knee: Secondary | ICD-10-CM

## 2019-04-03 DIAGNOSIS — R209 Unspecified disturbances of skin sensation: Secondary | ICD-10-CM

## 2019-04-03 NOTE — Telephone Encounter (Signed)
Patient has had 2 ESI's no relief wants to know what you want her to do next, she wants me to call her and let her know, she did not want to wait.

## 2019-04-03 NOTE — Patient Instructions (Signed)

## 2019-04-03 NOTE — Telephone Encounter (Signed)
I called her to advise  Put in the referral to neurosurgeon.

## 2019-04-03 NOTE — Telephone Encounter (Signed)
My treatment for her back has been exhausted   Neurosurgery referrall

## 2019-04-03 NOTE — Progress Notes (Signed)
Chief Complaint  Patient presents with  . Follow-up    Recheck on bilateral knees.    XT 63 years old previously seen for osteoarthritis of the knee and degenerative disc disease lumbar spine still having back pain despite ESI's.  She was given injections and a brace for the left knee which she lost.  She has bilateral knee pain frequent falls.  Quality dull ache location bilateral knee diffuse severity moderate duration going on several years now.  Current medications are tramadol Voltaren and gabapentin     Review of Systems  Constitutional: Negative for chills and fever.  Musculoskeletal: Positive for back pain, falls and joint pain.    Past Medical History:  Diagnosis Date  . Anxiety   . Asthma   . Bipolar 1 disorder ([place])   . Bulging lumbar disc   . Chronic pain of left knee   . COPD (chronic obstructive pulmonary disease) ([place])   . Diabetes mellitus   . Neuropathy    Outpatient Encounter Medications as of 04/03/2019  Medication Sig Note  . albuterol (PROVENTIL HFA;VENTOLIN HFA) 108 (90 Base) MCG/ACT inhaler Inhale 2 puffs into the lungs every 4 (four) hours as needed.   Marland Kitchen albuterol (PROVENTIL) (2.5 MG/3ML) 0.083% nebulizer solution Take 2.5 mg by nebulization every 6 (six) hours as needed for shortness of breath.    Marland Kitchen amLODipine (NORVASC) 5 MG tablet Take 5 mg by mouth daily.   . citalopram (CELEXA) 20 MG tablet Take 20 mg by mouth daily.   Marland Kitchen dextromethorphan-guaiFENesin (MUCINEX DM) 30-600 MG 12hr tablet Take 1 tablet by mouth 2 (two) times daily.   . diclofenac (VOLTAREN) 75 MG EC tablet TAKE  (1)  TABLET TWICE A DAY.   . divalproex (DEPAKOTE ER) 500 MG 24 hr tablet Take 500-1,000 mg by mouth See admin instructions. 500MG  in the morning and 1000mg  at bedtime   . DULoxetine (CYMBALTA) 30 MG capsule Take 1 capsule (30 mg total) by mouth daily.   . Fluticasone-Salmeterol (ADVAIR DISKUS) 250-50 MCG/DOSE AEPB Inhale 1 puff into the lungs 2 (two) times daily.   Marland Kitchen gabapentin  (NEURONTIN) 300 MG capsule Take 3 capsules (900 mg total) by mouth 3 (three) times daily.   . insulin NPH-regular (NOVOLIN 70/30) (70-30) 100 UNIT/ML injection Inject 50 Units into the skin 3 (three) times daily.    Marland Kitchen lisinopril (PRINIVIL,ZESTRIL) 5 MG tablet Take 5 mg by mouth daily.   Marland Kitchen oxybutynin (DITROPAN) 5 MG tablet Take 5 mg by mouth 2 (two) times daily.   . pantoprazole (PROTONIX) 40 MG tablet Take 1 tablet (40 mg total) by mouth daily.   Marland Kitchen tiotropium (SPIRIVA HANDIHALER) 18 MCG inhalation capsule Place 1 capsule (18 mcg total) into inhaler and inhale daily.   . traMADol (ULTRAM) 50 MG tablet TAKE (1) TABLET EVERY SIX HOURS AS NEEDED FOR SEVERE PAIN.    - MAY MAKE DROWSY -   . traZODone (DESYREL) 100 MG tablet Take 200 mg by mouth at bedtime as needed for sleep.  06/06/2018: Patient has on hand but does not take  . benzonatate (TESSALON) 100 MG capsule Take 1 capsule (100 mg total) by mouth 3 (three) times daily as needed (uncontrolled coughing spells). (Patient not taking: Reported on 09/12/2018)    No facility-administered encounter medications on file as of 04/03/2019.     Physical Exam Vitals signs and nursing note reviewed.  Constitutional:      Appearance: Normal appearance.  Musculoskeletal:     Right knee: She exhibits no  effusion.     Left knee: She exhibits no effusion.  Neurological:     Mental Status: She is alert and oriented to person, place, and time.  Psychiatric:        Mood and Affect: Mood normal.    Right Knee Exam   Muscle Strength  The patient has normal right knee strength.  Tenderness  The patient is experiencing tenderness in the medial joint line and lateral joint line.  Range of Motion  Extension: normal  Flexion:  120 normal   Tests  McMurray:  Medial - negative Lateral - negative Varus: negative Valgus: negative Drawer:  Anterior - negative    Posterior - negative  Other  Erythema: absent Scars: absent Sensation: normal Pulse:  present Swelling: none Effusion: no effusion present   Left Knee Exam   Muscle Strength  The patient has normal left knee strength.  Tenderness  The patient is experiencing tenderness in the medial joint line and lateral joint line.  Range of Motion  Extension: normal  Flexion:  120 normal   Tests  McMurray:  Medial - negative Lateral - negative Varus: negative Valgus: negative Drawer:  Anterior - negative     Posterior - negative  Other  Erythema: absent Scars: absent Sensation: normal Pulse: present Swelling: none Effusion: no effusion present     Procedure note for bilateral knee injections  Procedure note left knee injection verbal consent was obtained to inject left knee joint  Timeout was completed to confirm the site of injection  The medications used were 40 mg of Depo-Medrol and 1% lidocaine 3 cc  Anesthesia was provided by ethyl chloride and the skin was prepped with alcohol.  After cleaning the skin with alcohol a 20-gauge needle was used to inject the left knee joint. There were no complications. A sterile bandage was applied.   Procedure note right knee injection verbal consent was obtained to inject right knee joint  Timeout was completed to confirm the site of injection  The medications used were 40 mg of Depo-Medrol and 1% lidocaine 3 cc  Anesthesia was provided by ethyl chloride and the skin was prepped with alcohol.  After cleaning the skin with alcohol a 20-gauge needle was used to inject the right knee joint. There were no complications. A sterile bandage was applied.  Recommend Bilateral economy hinge braces  She will not do well in therapy post op and therefore is not a surgical candidate    She says esi not helping her back  Follow-up in 6 months

## 2019-05-01 ENCOUNTER — Other Ambulatory Visit: Payer: Self-pay | Admitting: Neurology

## 2019-05-01 ENCOUNTER — Other Ambulatory Visit: Payer: Self-pay | Admitting: Orthopedic Surgery

## 2019-05-01 DIAGNOSIS — M1712 Unilateral primary osteoarthritis, left knee: Secondary | ICD-10-CM

## 2019-05-09 ENCOUNTER — Telehealth: Payer: Self-pay | Admitting: Orthopedic Surgery

## 2019-05-09 NOTE — Telephone Encounter (Signed)
Patient called to relay that she has received a bill in the amount of $313.78 from [facility], for knee braces, which she states were given at visit on 04/03/19. States they are also a little small, but that they have helped. Patient's concern is that she said she had braces in past that were "paid for in full by the [facility] discount." She is currently under the 100% [facility] financial discount. I relayed that the braces that were discussed and fitted from [facility] would not be covered under this financial discount, as this company is not under the [employer]. Relayed to patient that the company may have a program of financial assistance as well if she will call and ask how she may apply-likley will need to provide a letter of financial hardship. Patient would like to know if there are any braces she can get under the [facility] discount which would be a no charge.

## 2019-05-09 NOTE — Telephone Encounter (Signed)
There is nothing I can do, I told her when she signed paper they were expensive, and she would get a bill these are not items covered by the discount. She told me she would not get a bill,  since she did not get one last time. [facility] discount is not insurance, everything is not covered. She knew when she left there was a 2 week return policy and she chose the braces she was fitted with.   I Will forward to Rhonda Hickman to discuss further. Has been longer than 2 weeks, she may not return the braces.

## 2019-05-22 NOTE — Telephone Encounter (Signed)
Correct, per what Amy has told the patient, braces cannot be returned and are billed out differently and explained to patients when they sign for receipt/guarantee of payment of them.

## 2019-05-29 ENCOUNTER — Telehealth: Payer: Self-pay | Admitting: Orthopedic Surgery

## 2019-05-29 NOTE — Telephone Encounter (Signed)
Leah from [facility] called.  She stated Rhonda Hickman stated she was self pay and that she would just call them back to schedule an appointment when she could afford it.    Denny Peon said if there were any questions to call her at [phone]  Extension [phone]  I did not make any comments or changes on this patient's referral.    Thanks

## 2019-05-31 ENCOUNTER — Telehealth: Payer: Self-pay

## 2019-05-31 NOTE — Telephone Encounter (Signed)
No, there is not.  I have previously advised her, this is not insurance and does not cover every office I will call again, but I told her this when referral was made.

## 2019-05-31 NOTE — Telephone Encounter (Signed)
Patient called and said that she has the [facility] Discount and [facility] does not accept that. She is asking if there was another place to be referred to that does accept the discount.   Please call and advise

## 2019-05-31 NOTE — Telephone Encounter (Signed)
I have advised her.

## 2019-06-03 ENCOUNTER — Other Ambulatory Visit: Payer: Self-pay | Admitting: Orthopedic Surgery

## 2019-06-03 DIAGNOSIS — G8929 Other chronic pain: Secondary | ICD-10-CM

## 2019-06-03 DIAGNOSIS — M1712 Unilateral primary osteoarthritis, left knee: Secondary | ICD-10-CM

## 2019-07-01 ENCOUNTER — Emergency Department (HOSPITAL_COMMUNITY): Payer: Self-pay

## 2019-07-01 ENCOUNTER — Encounter (HOSPITAL_COMMUNITY): Payer: Self-pay | Admitting: *Deleted

## 2019-07-01 ENCOUNTER — Other Ambulatory Visit: Payer: Self-pay

## 2019-07-01 ENCOUNTER — Emergency Department (HOSPITAL_COMMUNITY)
Admission: EM | Admit: 2019-07-01 | Discharge: 2019-07-01 | Disposition: A | Discharge status: Home / Self Care | Payer: Self-pay | Attending: Emergency Medicine | Admitting: Emergency Medicine

## 2019-07-01 DIAGNOSIS — Z791 Long term (current) use of non-steroidal anti-inflammatories (NSAID): Secondary | ICD-10-CM | POA: Insufficient documentation

## 2019-07-01 DIAGNOSIS — M25532 Pain in left wrist: Secondary | ICD-10-CM | POA: Insufficient documentation

## 2019-07-01 DIAGNOSIS — F17210 Nicotine dependence, cigarettes, uncomplicated: Secondary | ICD-10-CM | POA: Insufficient documentation

## 2019-07-01 DIAGNOSIS — J449 Chronic obstructive pulmonary disease, unspecified: Secondary | ICD-10-CM | POA: Insufficient documentation

## 2019-07-01 DIAGNOSIS — Z79899 Other long term (current) drug therapy: Secondary | ICD-10-CM | POA: Insufficient documentation

## 2019-07-01 DIAGNOSIS — J45909 Unspecified asthma, uncomplicated: Secondary | ICD-10-CM | POA: Insufficient documentation

## 2019-07-01 DIAGNOSIS — Z794 Long term (current) use of insulin: Secondary | ICD-10-CM | POA: Insufficient documentation

## 2019-07-01 DIAGNOSIS — G8929 Other chronic pain: Secondary | ICD-10-CM

## 2019-07-01 DIAGNOSIS — E119 Type 2 diabetes mellitus without complications: Secondary | ICD-10-CM | POA: Insufficient documentation

## 2019-07-01 MED ORDER — HYDROCODONE-ACETAMINOPHEN 5-325 MG PO TABS
1.0000 | ORAL_TABLET | Freq: Once | ORAL | Status: AC
  Administered 2019-07-01: 18:00:00 1 via ORAL
  Filled 2019-07-01: qty 1

## 2019-07-01 MED ORDER — HYDROCODONE-ACETAMINOPHEN 5-325 MG PO TABS: ORAL_TABLET | ORAL | 0 refills | Status: DC

## 2019-07-01 NOTE — ED Provider Notes (Signed)
[facility] EMERGENCY DEPARTMENT Provider Note   CSN: [id number] Arrival date & time: 07/01/19  1249     History Chief Complaint  Patient presents with  . Hand Pain    Rhonda Hickman is a 63 y.o. female.  HPI      Rhonda Hickman is a 63 y.o. female who presents to the Emergency Department complaining of left hand and wrist pain and swelling for nearly 2 weeks.  Symptoms began after a fall.  She is unclear if she fell directly on her hands, but reports persistent pain to her distal wrist that radiates into the fingers of her hand.  She has been wearing an OTC compression glove with some relief.  Pain is worse with movement of her wrist.  She notes having difficulty gripping and holding objects with her left hand.  She denies open wounds, numbness of her hand or fingers, and elbow pain.  She is right hand dominant.  She states she has an upcoming appt with Dr. Aline Brochure    Past Medical History:  Diagnosis Date  . Anxiety   . Asthma   . Bipolar 1 disorder ([place])   . Bulging lumbar disc   . Chronic pain of left knee   . COPD (chronic obstructive pulmonary disease) ([place])   . Diabetes mellitus   . Neuropathy     Patient Active Problem List   Diagnosis Date Noted  . Bronchiectasis with acute exacerbation ([place])   . Obesity, Class III, BMI 40-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) ([place])   . Gastroesophageal reflux disease   . Asthma exacerbation 06/06/2018  . Gait abnormality 08/29/2017  . Urinary incontinence 08/29/2017  . Paresthesia 08/29/2017  . Derangement of posterior horn of medial meniscus of right knee   . Meniscus, lateral, derangement, right   . Arthritis of knee, right   . COPD with acute exacerbation ([place]) 03/09/2016  . DM (diabetes mellitus) ([place]) 02/16/2007  . Cocaine abuse ([place]) 02/16/2007  . Extrinsic asthma 02/16/2007  . HOMELESSNESS, HX OF 02/16/2007    Past Surgical History:  Procedure Laterality Date  . ABDOMINAL HYSTERECTOMY    . CARPAL TUNNEL RELEASE Right   .  KNEE ARTHROSCOPY WITH MEDIAL MENISECTOMY Right 04/14/2016   Procedure: KNEE ARTHROSCOPY WITH MEDIAL AND LATERAL MENISECTOMY, MICROFRACTURE REPAIR;  Surgeon: Carole Civil, MD;  Location: [facility];  Service: Orthopedics;  Laterality: Right;  lateral menisectomy - needs crutch training     OB History    Gravida      Para      Term      Preterm      AB      Living  0     SAB      TAB      Ectopic      Multiple      Live Births              Family History  Problem Relation Age of Onset  . Diabetes Mother   . Heart attack Father     Social History   Tobacco Use  . Smoking status: Current Some Day Smoker    Packs/day: 0.25    Types: Cigarettes  . Smokeless tobacco: Never Used  . Tobacco comment: trying to cut down  Substance Use Topics  . Alcohol use: No  . Drug use: No    Home Medications Prior to Admission medications   Medication Sig Start Date End Date Taking? Authorizing Provider  albuterol (PROVENTIL HFA;VENTOLIN HFA) 108 (90 Base) MCG/ACT  inhaler Inhale 2 puffs into the lungs every 4 (four) hours as needed. 08/20/17   Rolland Porter, MD  albuterol (PROVENTIL) (2.5 MG/3ML) 0.083% nebulizer solution Take 2.5 mg by nebulization every 6 (six) hours as needed for shortness of breath.     [provider]  amLODipine (NORVASC) 5 MG tablet Take 5 mg by mouth daily.    [provider]  benzonatate (TESSALON) 100 MG capsule Take 1 capsule (100 mg total) by mouth 3 (three) times daily as needed (uncontrolled coughing spells). Patient not taking: Reported on 09/12/2018 06/07/18   Barton Dubois, MD  citalopram (CELEXA) 20 MG tablet Take 20 mg by mouth daily.    [provider]  dextromethorphan-guaiFENesin (MUCINEX DM) 30-600 MG 12hr tablet Take 1 tablet by mouth 2 (two) times daily. 06/07/18   Barton Dubois, MD  diclofenac (VOLTAREN) 75 MG EC tablet TAKE  (1)  TABLET TWICE A DAY. 06/03/19   Carole Civil, MD  divalproex (DEPAKOTE ER)  500 MG 24 hr tablet Take 500-1,000 mg by mouth See admin instructions. 500MG  in the morning and 1000mg  at bedtime 03/26/18   [provider]  DULoxetine (CYMBALTA) 30 MG capsule Take 1 capsule (30 mg total) by mouth daily. 09/12/18   Suzzanne Cloud, NP  Fluticasone-Salmeterol (ADVAIR DISKUS) 250-50 MCG/DOSE AEPB Inhale 1 puff into the lungs 2 (two) times daily. 06/07/18 06/07/19  Barton Dubois, MD  gabapentin (NEURONTIN) 300 MG capsule Take 3 capsules (900 mg total) by mouth 3 (three) times daily. 10/26/18   Sater, Nanine Means, MD  insulin NPH-regular (NOVOLIN 70/30) (70-30) 100 UNIT/ML injection Inject 50 Units into the skin 3 (three) times daily.     [provider]  lisinopril (PRINIVIL,ZESTRIL) 5 MG tablet Take 5 mg by mouth daily.    [provider]  oxybutynin (DITROPAN) 5 MG tablet Take 5 mg by mouth 2 (two) times daily.    [provider]  pantoprazole (PROTONIX) 40 MG tablet Take 1 tablet (40 mg total) by mouth daily. 06/07/18 06/07/19  Barton Dubois, MD  tiotropium (SPIRIVA HANDIHALER) 18 MCG inhalation capsule Place 1 capsule (18 mcg total) into inhaler and inhale daily. 06/07/18 06/07/19  Barton Dubois, MD  traMADol (ULTRAM) 50 MG tablet TAKE (1) TABLET EVERY SIX HOURS AS NEEDED FOR SEVERE PAIN.    - MAY MAKE DROWSY - 06/03/19   Carole Civil, MD  traZODone (DESYREL) 100 MG tablet Take 200 mg by mouth at bedtime as needed for sleep.     [provider]    Allergies    Aspirin and Ibuprofen  Review of Systems   Review of Systems  Constitutional: Negative for chills and fever.  Gastrointestinal: Negative for abdominal pain, nausea and vomiting.  Musculoskeletal: Positive for arthralgias (left hand and wrist pain, swelling) and joint swelling. Negative for neck pain.  Skin: Negative for color change and wound.  Neurological: Negative for dizziness, weakness and numbness.    Physical Exam Updated Vital Signs BP 127/76 (BP Location:  Right Arm)   Pulse 77   Temp 98.9 F (37.2 C) (Oral)   Resp (!) 22   Ht 6' (1.829 m)   Wt 135.6 kg   SpO2 97%   BMI 40.55 kg/m   Physical Exam Vitals and nursing note reviewed.  Constitutional:      Appearance: Normal appearance. She is not ill-appearing or toxic-appearing.  HENT:     Head: Normocephalic.  Cardiovascular:     Rate and Rhythm: Normal rate and  regular rhythm.     Pulses: Normal pulses.  Pulmonary:     Effort: Pulmonary effort is normal.     Breath sounds: Normal breath sounds.  Musculoskeletal:        General: Swelling and tenderness present.     Left hand: Tenderness present. No bony tenderness. Normal sensation. There is no disruption of two-point discrimination. Normal capillary refill. Normal pulse.     Cervical back: Normal range of motion. No tenderness.     Comments: ttp of the radial and ulnar aspects of the distal left wrist.  Mild edema and tenderness noted to the fingers.  Pt has been wearing a compression glove.  No excessive warmth or erythema noted.  No lesions.  No forearm tenderness.       Skin:    General: Skin is warm.     Capillary Refill: Capillary refill takes less than 2 seconds.     Findings: No erythema, lesion or rash.  Neurological:     General: No focal deficit present.     Mental Status: She is alert.     Sensory: No sensory deficit.     Motor: No weakness.     ED Results / Procedures / Treatments   Labs (all labs ordered are listed, but only abnormal results are displayed) Labs Reviewed - No data to display  EKG None  Radiology DG Hand Complete Left  Result Date: 07/01/2019 CLINICAL DATA:  Swelling left hand. EXAM: LEFT HAND - COMPLETE 3+ VIEW COMPARISON:  No recent. FINDINGS: No acute bony or joint abnormality identified. No evidence of fracture or dislocation. No significant arthropathy. No radiopaque foreign body. IMPRESSION: No acute abnormality identified. Electronically Signed   By: Marcello Moores  Register   On: 07/01/2019  17:42    Procedures Procedures (including critical care time)  Medications Ordered in ED Medications  HYDROcodone-acetaminophen (NORCO/VICODIN) 5-325 MG per tablet 1 tablet (has no administration in time range)    ED Course  I have reviewed the triage vital signs and the nursing notes.  Pertinent labs & imaging results that were available during my care of the patient were reviewed by me and considered in my medical decision making (see chart for details).    MDM Rules/Calculators/A&P                      Pt with left wrist pain with radicular pain to the fingers.  Sx's secondary to mechanical fall.  XR of hand negative for fx or dislocation.  NV intact.  Possible ligamentous injury.  Pt has upcoming appt with Dr. Aline Brochure.  Wrist splint applied.     Final Clinical Impression(s) / ED Diagnoses Final diagnoses:  Left wrist pain    Rx / DC Orders ED Discharge Orders    None       Bufford Lope 07/02/19 1630    Virgel Manifold, MD 07/07/19 1026

## 2019-07-01 NOTE — Discharge Instructions (Addendum)
Elevate your hand as much as possible.  Wear the brace except for bathing.  Call Dr. Ruthe Mannan office tomorrow to arrange a follow-up appt.

## 2019-07-01 NOTE — ED Triage Notes (Signed)
Pain in left hand with swelling for 2 weeks getting worse

## 2019-07-10 ENCOUNTER — Other Ambulatory Visit: Payer: Self-pay

## 2019-07-10 ENCOUNTER — Other Ambulatory Visit: Payer: Self-pay | Admitting: Orthopedic Surgery

## 2019-07-10 ENCOUNTER — Telehealth: Payer: Self-pay | Admitting: Orthopedic Surgery

## 2019-07-10 ENCOUNTER — Ambulatory Visit (INDEPENDENT_AMBULATORY_CARE_PROVIDER_SITE_OTHER): Payer: Self-pay | Admitting: Orthopedic Surgery

## 2019-07-10 VITALS — BP 115/81 | HR 85 | Temp 97.1°F | Ht 72.0 in | Wt 295.0 lb

## 2019-07-10 DIAGNOSIS — M1712 Unilateral primary osteoarthritis, left knee: Secondary | ICD-10-CM

## 2019-07-10 DIAGNOSIS — G8929 Other chronic pain: Secondary | ICD-10-CM

## 2019-07-10 DIAGNOSIS — Z716 Tobacco abuse counseling: Secondary | ICD-10-CM

## 2019-07-10 DIAGNOSIS — M25561 Pain in right knee: Secondary | ICD-10-CM

## 2019-07-10 DIAGNOSIS — M79642 Pain in left hand: Secondary | ICD-10-CM

## 2019-07-10 DIAGNOSIS — Z6841 Body Mass Index (BMI) 40.0 and over, adult: Secondary | ICD-10-CM

## 2019-07-10 NOTE — Telephone Encounter (Signed)
I explained to her order for the splint went to [place], they do the splint.  Order for the therapy went to [facility], they do the therapy.   She states she called [place] office and they do not do splints any more

## 2019-07-10 NOTE — Progress Notes (Signed)
Rhonda Hickman  07/10/2019  Body mass index is 40.01 kg/m.   New problem  HISTORY SECTION :  Chief Complaint  Patient presents with  . Hand Pain    Left hand pain, DOI around 3 weeks ago.   HPI The patient presents for evaluation of  (mild/moderate/severe/ ) severe pain, in the (right /left) left wrist, for 9 days, associated with swelling decreased range of motion status post fall onto the left wrist.  Prior treatment splint was applied in the ER after x-ray was negative patient was put on tramadol reports no improvement although she does say that the hand feels better without the splint on.  She just took the splint off 2 days ago and placed her hand in a glove   Review of Systems  Musculoskeletal: Positive for joint pain.  Skin: Negative for rash.  Neurological: Negative for tingling.     has a past medical history of Anxiety, Asthma, Bipolar 1 disorder ([place]), Bulging lumbar disc, Chronic pain of left knee, COPD (chronic obstructive pulmonary disease) ([place]), Diabetes mellitus, and Neuropathy.   Past Surgical History:  Procedure Laterality Date  . ABDOMINAL HYSTERECTOMY    . CARPAL TUNNEL RELEASE Right   . KNEE ARTHROSCOPY WITH MEDIAL MENISECTOMY Right 04/14/2016   Procedure: KNEE ARTHROSCOPY WITH MEDIAL AND LATERAL MENISECTOMY, MICROFRACTURE REPAIR;  Surgeon: Carole Civil, MD;  Location: [facility];  Service: Orthopedics;  Laterality: Right;  lateral menisectomy - needs crutch training    Body mass index is 40.01 kg/m.   Allergies  Allergen Reactions  . Aspirin Nausea Only and Other (See Comments)    Causes stomach pain  . Ibuprofen Other (See Comments)    Stomach problems     Current Outpatient Medications:  .  albuterol (PROVENTIL HFA;VENTOLIN HFA) 108 (90 Base) MCG/ACT inhaler, Inhale 2 puffs into the lungs every 4 (four) hours as needed., Disp: 6.7 g, Rfl: 0 .  albuterol (PROVENTIL) (2.5 MG/3ML) 0.083% nebulizer solution, Take 2.5 mg by nebulization every  6 (six) hours as needed for shortness of breath. , Disp: , Rfl:  .  diclofenac (VOLTAREN) 75 MG EC tablet, TAKE  (1)  TABLET TWICE A DAY., Disp: 60 tablet, Rfl: 0 .  divalproex (DEPAKOTE ER) 500 MG 24 hr tablet, Take 500-1,000 mg by mouth See admin instructions. 500MG  in the morning and 1000mg  at bedtime, Disp: , Rfl:  .  gabapentin (NEURONTIN) 300 MG capsule, Take 3 capsules (900 mg total) by mouth 3 (three) times daily., Disp: 131 capsule, Rfl: 0 .  insulin NPH-regular (NOVOLIN 70/30) (70-30) 100 UNIT/ML injection, Inject 50 Units into the skin 3 (three) times daily. , Disp: , Rfl:  .  lisinopril (PRINIVIL,ZESTRIL) 5 MG tablet, Take 5 mg by mouth daily., Disp: , Rfl:  .  oxybutynin (DITROPAN) 5 MG tablet, Take 5 mg by mouth 2 (two) times daily., Disp: , Rfl:  .  traMADol (ULTRAM) 50 MG tablet, TAKE (1) TABLET EVERY SIX HOURS AS NEEDED FOR SEVERE PAIN.    - MAY MAKE DROWSY -, Disp: 60 tablet, Rfl: 0 .  amLODipine (NORVASC) 5 MG tablet, Take 5 mg by mouth daily., Disp: , Rfl:  .  benzonatate (TESSALON) 100 MG capsule, Take 1 capsule (100 mg total) by mouth 3 (three) times daily as needed (uncontrolled coughing spells). (Patient not taking: Reported on 09/12/2018), Disp: 20 capsule, Rfl: 0 .  citalopram (CELEXA) 20 MG tablet, Take 20 mg by mouth daily., Disp: , Rfl:  .  dextromethorphan-guaiFENesin ([place] DM) 30-600  MG 12hr tablet, Take 1 tablet by mouth 2 (two) times daily. (Patient not taking: Reported on 07/10/2019), Disp: 40 tablet, Rfl: 0 .  DULoxetine (CYMBALTA) 30 MG capsule, Take 1 capsule (30 mg total) by mouth daily. (Patient not taking: Reported on 07/10/2019), Disp: 30 capsule, Rfl: 3 .  Fluticasone-Salmeterol (ADVAIR DISKUS) 250-50 MCG/DOSE AEPB, Inhale 1 puff into the lungs 2 (two) times daily., Disp: 60 each, Rfl: 3 .  HYDROcodone-acetaminophen (NORCO/VICODIN) 5-325 MG tablet, Take one tab po q 4 hrs prn pain (Patient not taking: Reported on 07/10/2019), Disp: 8 tablet, Rfl: 0 .   pantoprazole (PROTONIX) 40 MG tablet, Take 1 tablet (40 mg total) by mouth daily., Disp: 30 tablet, Rfl: 1 .  tiotropium (SPIRIVA HANDIHALER) 18 MCG inhalation capsule, Place 1 capsule (18 mcg total) into inhaler and inhale daily., Disp: 30 capsule, Rfl: 3 .  traZODone (DESYREL) 100 MG tablet, Take 200 mg by mouth at bedtime as needed for sleep. , Disp: , Rfl:    PHYSICAL EXAM SECTION: 1) BP 115/81   Pulse 85   Temp (!) 97.1 F (36.2 C)   Ht 6' (1.829 m)   Wt 295 lb (133.8 kg)   BMI 40.01 kg/m   Body mass index is 40.01 kg/m. General appearance: Well-developed well-nourished no gross deformities  2) Cardiovascular normal pulse and perfusion in the upper extremities normal color without edema  3) Neurologically deep tendon reflexes are equal and normal, no sensation loss or deficits no pathologic reflexes  4) Psychological: Awake alert and oriented x3 mood and affect normal  5) Skin no lacerations or ulcerations no nodularity no palpable masses, no erythema or nodularity  6) Musculoskeletal:   Right wrist looks normal has full range of motion there is no tenderness or swelling neurovascular exam is intact skin is clean dry and intact grip strength is normal  Left wrist is tender there is swelling of the hand and wrist first extensor compartment nontender wrist joint is tender patient holds her fingers in extension I can bend the fingers at the metacarpophalangeal joints passively and she has mild active motion there she does not have good motion in the IP joints.  Skin is intact neurovascular exam is otherwise normal   MEDICAL DECISION SECTION:  Encounter Diagnosis  Name Primary?  . Pain in left hand Yes   The patient meets the AMA guidelines for Morbid (severe) obesity with a BMI > 40.0 and I have recommended weight loss.   Imaging Independent personal interpretation of the hospital x-rays 3 views of the left hand show no fracture dislocation or degenerative changes  Plan:   (Rx., Inj., surg., Frx, MRI/CT, XR:2) Elevate the hand and apply ice 3 times a day try to move the fingers much as you can  Start occupational therapy  You will be given a splint to wear it will not be constricting we will hold your hand in a functional position  Resume diclofenac and tramadol  Advised to stop smoking   Advised to lose weight  Follow-up 6 weeks  Diagnosis wrist sprain    10:30 AM Arther Abbott, MD  07/10/2019

## 2019-07-10 NOTE — Telephone Encounter (Signed)
Rhonda Hickman called back and stated that she spoke with someone at PT and they told her that they do not do hand splints.  She was told they do those in [place].  I asked her if she knew where it [place] but she said she wasn't told.    Please call patient and advise what to do.  Thanks

## 2019-07-10 NOTE — Telephone Encounter (Signed)
I called to ask, but could not hold, will try again later, when not in clinic

## 2019-07-10 NOTE — Patient Instructions (Addendum)
Elevate the hand and apply ice 3 times a day try to move the fingers much as you can  Start occupational therapy  You will be given a splint to wear it will not be constricting we will hold your hand in a functional position  Resume diclofenac and tramadol  Follow-up 6 weeks  Diagnosis wrist sprain Wrist Sprain, Adult A wrist sprain is a stretch or tear in the strong, fibrous tissues (ligaments) that connect your wrist bones. There are three types of wrist sprains:  Grade 1. In this type of sprain, the ligament is stretched more than normal.  Grade 2. In this type of sprain, the ligament is partially torn. You may be able to move your wrist, but not very much.  Grade 3. In this type of sprain, the ligament or muscle is completely torn. You may find it difficult or extremely painful to move your wrist even a little. What are the causes? A wrist sprain can be caused by using the wrist too much during sports, exercise, or at work. It can also happen with a fall or during an accident. What increases the risk? This condition is more likely to occur in people:  With a previous wrist or arm injury.  With poor wrist strength and flexibility.  Who play contact sports, such as football or soccer.  Who play sports that may result in a fall, such as skateboarding, biking, skiing, or snowboarding.  Who do not exercise regularly.  Who use exercise equipment that does not fit well. What are the signs or symptoms? Symptoms of this condition include:  Pain in the wrist, arm, or hand.  Swelling or bruised skin near the wrist, hand, or arm. The skin may look yellow or kind of blue.  Stiffness or trouble moving the hand.  Hearing a pop or feeling a tear at the time of the injury.  A warm feeling in the skin around the wrist. How is this diagnosed? This condition is diagnosed with a physical exam. Sometimes an X-ray is taken to make sure a bone did not break. If your health care provider  thinks that you tore a ligament, he or she may order an MRI of your wrist. How is this treated? This condition is treated by resting and applying ice to your wrist. Additional treatment may include:  Medicine for pain and inflammation.  A splint to keep your wrist still (immobilized).  Exercises to strengthen and stretch your wrist.  Surgery. This may be done if the ligament is completely torn. Follow these instructions at home: If you have a splint:   Do not put pressure on any part of the splint until it is fully hardened. This may take several hours.  Wear the splint as told by your health care provider. Remove it only as told by your health care provider.  Loosen the splint if your fingers tingle, become numb, or turn cold and blue.  If your splint is not waterproof: ? Do not let it get wet. ? Cover it with a watertight covering when you take a bath or a shower.  Keep the splint clean. Managing pain, stiffness, and swelling   If directed, put ice on the injured area. ? If you have a removable splint, remove it as told by your health care provider. ? Put ice in a plastic bag. ? Place a towel between your skin and the bag or between the splint and the bag. ? Leave the ice on for 20 minutes, 2-3  times per day.  Move your fingers often to avoid stiffness and to lessen swelling.  Raise (elevate) the injured area above the level of your heart while you are sitting or lying down. Activity  Rest your wrist. Do not do things that cause pain.  Return to your normal activities as told by your health care provider. Ask your health care provider what activities are safe for you.  Do exercises as told by your health care provider. General instructions  Take over-the-counter and prescription medicines only as told by your health care provider.  Do not use any products that contain nicotine or tobacco, such as cigarettes and e-cigarettes. These can delay healing. If you need help  quitting, ask your health care provider.  Ask your health care provider when it is safe to drive if you have a splint.  Keep all follow-up visits as told by your health care provider. This is important. Contact a health care provider if:  Your pain, bruising, or swelling gets worse.  Your skin becomes red, gets a rash, or has open sores.  Your pain does not get better or it gets worse. Get help right away if:  You have a new or sudden sharp pain in the hand, arm, or wrist.  You have tingling or numbness in your hand.  Your fingers turn white, very red, or cold and blue.  You cannot move your fingers. This information is not intended to replace advice given to you by your health care provider. Make sure you discuss any questions you have with your health care provider. Document Released: 02/28/2014 Document Revised: 06/09/2017 Document Reviewed: 01/14/2016 Elsevier Patient Education  2020 [name].

## 2019-07-11 NOTE — Telephone Encounter (Signed)
Rhonda Hickman has approved for this to be done. They will call patient. I think she was advised in error they do not make splints, they have new staff at desk.

## 2019-07-16 ENCOUNTER — Other Ambulatory Visit: Payer: Self-pay

## 2019-07-16 ENCOUNTER — Encounter (HOSPITAL_COMMUNITY): Payer: Self-pay | Admitting: Specialist

## 2019-07-16 ENCOUNTER — Ambulatory Visit (HOSPITAL_COMMUNITY): Payer: 59 | Attending: Orthopedic Surgery | Admitting: Specialist

## 2019-07-16 DIAGNOSIS — R278 Other lack of coordination: Secondary | ICD-10-CM | POA: Diagnosis present

## 2019-07-16 DIAGNOSIS — M79642 Pain in left hand: Secondary | ICD-10-CM | POA: Insufficient documentation

## 2019-07-16 DIAGNOSIS — M25532 Pain in left wrist: Secondary | ICD-10-CM | POA: Diagnosis present

## 2019-07-16 DIAGNOSIS — M25642 Stiffness of left hand, not elsewhere classified: Secondary | ICD-10-CM | POA: Diagnosis present

## 2019-07-16 DIAGNOSIS — R29898 Other symptoms and signs involving the musculoskeletal system: Secondary | ICD-10-CM | POA: Insufficient documentation

## 2019-07-16 DIAGNOSIS — M25632 Stiffness of left wrist, not elsewhere classified: Secondary | ICD-10-CM | POA: Diagnosis present

## 2019-07-16 NOTE — Patient Instructions (Signed)
Your Splint This splint should initially be fitted by a healthcare practitioner.  The healthcare practitioner is responsible for providing wearing instructions and precautions to the patient, other healthcare practitioners and care provider involved in the patient's care.  This splint was custom made for you. Please read the following instructions to learn about wearing and caring for your splint.  Precautions Should your splint cause any of the following problems, remove the splint immediately and contact your therapist/physician.  Swelling  Severe Pain  Pressure Areas  Stiffness  Numbness  Do not wear your splint while operating machinery unless it has been fabricated for that purpose.  When To Wear Your Splint Where your splint according to your therapist/physician instructions. All the time - removing for hygiene, exercises, rest only  Care and Cleaning of Your Splint 1. Keep your splint away from open flames. 2. Your splint will lose its shape in temperatures over 135 degrees Farenheit, ( in car windows, near radiators, ovens or in hot water).  Never make any adjustments to your splint, if the splint needs adjusting remove it and make an appointment to see your therapist. 3. Your splint, including the cushion liner may be cleaned with soap and lukewarm water.  Do not immerse in hot water over 135 degrees Farenheit. 4. Straps may be washed with soap and water, but do not moisten the self-adhesive portion. 5. For ink or hard to remove spots use a scouring cleanser which contains chlorine.  Rinse the splint thoroughly after using chlorine cleanser.     Questions - call Hazeline Junker, OTR/L [phone]

## 2019-07-16 NOTE — Therapy (Signed)
[facility] [place], [place], [address] Phone: [phone]   Fax:  [phone]  Occupational Therapy Evaluation  Patient Details  Name: Rhonda Hickman MRN: [id number] Date of Birth: [birth year] Referring Provider (OT): Dr. Arther Abbott   Encounter Date: 07/16/2019  OT End of Session - 07/16/19 1548    Visit Number  1    Number of Visits  1    Date for OT Re-Evaluation  07/23/19    Authorization Type  CAFA no auth required    OT Start Time  1435    OT Stop Time  1515    OT Time Calculation (min)  40 min    Activity Tolerance  Patient limited by fatigue;Patient limited by pain    Behavior During Therapy  --   lethargic due to recent dental surgery pain      Past Medical History:  Diagnosis Date  . Anxiety   . Asthma   . Bipolar 1 disorder ([name])   . Bulging lumbar disc   . Chronic pain of left knee   . COPD (chronic obstructive pulmonary disease) ([place])   . Diabetes mellitus   . Neuropathy     Past Surgical History:  Procedure Laterality Date  . ABDOMINAL HYSTERECTOMY    . CARPAL TUNNEL RELEASE Right   . KNEE ARTHROSCOPY WITH MEDIAL MENISECTOMY Right 04/14/2016   Procedure: KNEE ARTHROSCOPY WITH MEDIAL AND LATERAL MENISECTOMY, MICROFRACTURE REPAIR;  Surgeon: Carole Civil, MD;  Location: [facility];  Service: Orthopedics;  Laterality: Right;  lateral menisectomy - needs crutch training    There were no vitals filed for this visit.  Subjective Assessment - 07/16/19 1544    Subjective   S  i want something to make this feel better    Pertinent History  Patient reports injuring her left wrist and hand in an undisclosed manner approximately 1 month ago.  She was given a wrist brace in the ED, which made her arm feel worse.  She consulted with Dr. Aline Brochure and has been referred to our clinic for a one time visit for resting hand splint fabrication.  She will also recieve ongoing therapy for her wrist and hand pain at  our [facility].    Patient Stated Goals  Get a splint that makes her hand feel better    Currently in Pain?  Yes    Pain Score  8     Pain Location  Wrist    Pain Orientation  Left    Pain Descriptors / Indicators  Aching    Pain Type  Acute pain    Pain Onset  1 to 4 weeks ago    Pain Frequency  Constant    Aggravating Factors   use    Pain Relieving Factors  nothing        [facility] OT Assessment - 07/16/19 0001      Assessment   Medical Diagnosis  Left Wrist Pain    Referring Provider (OT)  Dr. Arther Abbott    Onset Date/Surgical Date  --   approximately 1 month ago   Hand Dominance  Right    Next MD Visit  unknown      Precautions   Precautions  None      Restrictions   Weight Bearing Restrictions  No      Balance Screen   Has the patient fallen in the past 6 months  No    Has the patient had a decrease in  activity level because of a fear of falling?   No    Is the patient reluctant to leave their home because of a fear of falling?   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    Leisure  tv and playing games on her phone      ADL   ADL comments  unable to use her left arm and hand with daily tasks due to increased pain and stiffness and edema in wrist and hand       Written Expression   Dominant Hand  Right      Vision - History   Baseline Vision  No visual deficits      Cognition   Overall Cognitive Status  Within Functional Limits for tasks assessed      Sensation   Light Touch  Appears Intact      Coordination   Gross Motor Movements are Fluid and Coordinated  Yes    Fine Motor Movements are Fluid and Coordinated  No    Coordination and Movement Description  needs further assessment       Edema   Edema  visible edema in left wrist and hand       ROM / Strength   AROM / PROM / Strength  AROM;PROM;Strength      Palpation   Palpation comment  moderate tenderness in left wrist and hand region      AROM   Overall AROM Comments  decreased range of motion present in left wrist and hand on gross assessment       PROM   Overall PROM Comments  decreased range of motion present in left hand and wrist on gross assessment       Strength   Overall Strength Comments  decreased strength in left wrist on gross assessment       Hand Function   Right Hand Grip (lbs)  needs further assessment    Left Hand Grip (lbs)  needs further assessment                OT Treatments/Exercises (OP) - 07/16/19 0001      Splinting   Splinting  Fabricated a left volar based resting hand splint positioning wrist in slight extension, and digits in resting position.  Patient educated to wear splint at all times, removing for hygiene, exercises only.  Patient able to demonstrate donning and doffing of splint and encouraged to avoid over tightening straps.  Also issued size large edema glove for edema management.  Patient able to don and doff edema glove independently, as well.              OT Education - 07/16/19 1547    Education Details  educated on splint wear and care    Person(s) Educated  Patient    Methods  Explanation;Demonstration;Handout    Comprehension  Verbalized understanding;Returned demonstration       OT Short Term Goals - 07/16/19 1602      OT SHORT TERM GOAL #1   Title  Patient will be educated on and independent with use of left resting hand splint.    Time  7  Period  Days    Status  New    Target Date  07/23/19               Plan - 07/16/19 1600    Clinical Impression Statement  A:  Patient is a 64 year old female presenting today with left wrist and hand pain due to an undisclosed injury.  Patient states she is not able to use her left hand  actively with any daily or leisure tasks due to her pain.  Patient was seen today for a one time evaluation for splint fabrication, and has been referred to a clinic closer to her home for ongoing treatment of her wrist and hand pain.    OT Occupational Profile and History  Problem Focused Assessment - Including review of records relating to presenting problem    Occupational performance deficits (Please refer to evaluation for details):  ADL's;IADL's;Rest and Sleep;Work;Social Participation    Body Structure / Function / Physical Skills  ADL;UE functional use;Pain;Edema    Rehab Potential  Good    Clinical Decision Making  Limited treatment options, no task modification necessary    Comorbidities Affecting Occupational Performance:  None    Modification or Assistance to Complete Evaluation   No modification of tasks or assist necessary to complete eval    OT Frequency  One time visit    OT Treatment/Interventions  Self-care/ADL training;Patient/family education;Splinting    Plan  P:  DC from skilled OT intervention this date with resting hand splint for home use.  Patient instructed to contact this clinic should she have any issues with splint.    Consulted and Agree with Plan of Care  Patient       Patient will benefit from skilled therapeutic intervention in order to improve the following deficits and impairments:   Body Structure / Function / Physical Skills: ADL, UE functional use, Pain, Edema       Visit Diagnosis: Pain in left wrist - Plan: Ot plan of care cert/re-cert    Problem List Patient Active Problem List   Diagnosis Date Noted  . Bronchiectasis with acute exacerbation ([place])   . Obesity, Class III, BMI 40-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) ([place])   . Gastroesophageal reflux disease   . Asthma exacerbation 06/06/2018  . Gait abnormality 08/29/2017  . Urinary incontinence 08/29/2017  . Paresthesia 08/29/2017  . Derangement of posterior horn of medial meniscus of right knee   .  Meniscus, lateral, derangement, right   . Arthritis of knee, right   . COPD with acute exacerbation ([place]) 03/09/2016  . DM (diabetes mellitus) ([facility]) 02/16/2007  . Cocaine abuse ([place]) 02/16/2007  . Extrinsic asthma 02/16/2007  . HOMELESSNESS, HX OF 02/16/2007    Vangie Bicker, [place], OTR/L [phone]  07/16/2019, 4:13 PM  [facility] [address] [place], [place], [address] Phone: [phone]   Fax:  [phone]  Name: Rhonda Hickman MRN: [id number] Date of Birth: Apr 13, 1956

## 2019-07-21 ENCOUNTER — Encounter (HOSPITAL_COMMUNITY): Payer: Self-pay | Admitting: Emergency Medicine

## 2019-07-21 ENCOUNTER — Other Ambulatory Visit: Payer: Self-pay

## 2019-07-21 ENCOUNTER — Emergency Department (HOSPITAL_COMMUNITY)
Admission: EM | Admit: 2019-07-21 | Discharge: 2019-07-21 | Disposition: A | Discharge status: Home / Self Care | Payer: Medicaid Other | Attending: Emergency Medicine | Admitting: Emergency Medicine

## 2019-07-21 DIAGNOSIS — F17210 Nicotine dependence, cigarettes, uncomplicated: Secondary | ICD-10-CM | POA: Diagnosis not present

## 2019-07-21 DIAGNOSIS — K0889 Other specified disorders of teeth and supporting structures: Secondary | ICD-10-CM | POA: Diagnosis not present

## 2019-07-21 DIAGNOSIS — E1140 Type 2 diabetes mellitus with diabetic neuropathy, unspecified: Secondary | ICD-10-CM | POA: Diagnosis not present

## 2019-07-21 DIAGNOSIS — Z794 Long term (current) use of insulin: Secondary | ICD-10-CM | POA: Diagnosis not present

## 2019-07-21 DIAGNOSIS — K0381 Cracked tooth: Secondary | ICD-10-CM | POA: Diagnosis not present

## 2019-07-21 MED ORDER — CLINDAMYCIN HCL 300 MG PO CAPS: 300.0000 mg | ORAL_CAPSULE | Freq: Three times a day (TID) | ORAL | 0 refills | Status: AC

## 2019-07-21 NOTE — ED Notes (Signed)
Patient left without signing discharge. 

## 2019-07-21 NOTE — ED Triage Notes (Signed)
Pt states that her left hand continues to hurt and she is having pain in her mouth from dental work done back in Dec.

## 2019-07-21 NOTE — ED Provider Notes (Signed)
[facility] Provider Note   CSN: [id number] Arrival date & time: 07/21/19  1418     History Chief Complaint  Patient presents with  . Arm Pain  . Dental Pain    Rhonda Hickman is a 64 y.o. female.  Pt is on an antibiotic for   The history is provided by the patient. No language interpreter was used.  Arm Pain This is a recurrent problem. The problem occurs constantly. The problem has been gradually worsening. Nothing aggravates the symptoms. Nothing relieves the symptoms. She has tried nothing for the symptoms.  Dental Pain Location:  Lower Quality:  Aching Severity:  Moderate Onset quality:  Gradual Context: dental caries   Relieved by:  Nothing Pt is seeing Dr. Moishe Spice for wrist pain.  Pt has appointment at [facility] for dental evaltuion.  Pt is on amoxil but she has continued swelling     Past Medical History:  Diagnosis Date  . Anxiety   . Asthma   . Bipolar 1 disorder ([place])   . Bulging lumbar disc   . Chronic pain of left knee   . COPD (chronic obstructive pulmonary disease) ([place])   . Diabetes mellitus   . Neuropathy     Patient Active Problem List   Diagnosis Date Noted  . Bronchiectasis with acute exacerbation ([place])   . Obesity, Class III, BMI 40-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) ([place])   . Gastroesophageal reflux disease   . Asthma exacerbation 06/06/2018  . Gait abnormality 08/29/2017  . Urinary incontinence 08/29/2017  . Paresthesia 08/29/2017  . Derangement of posterior horn of medial meniscus of right knee   . Meniscus, lateral, derangement, right   . Arthritis of knee, right   . COPD with acute exacerbation ([place]) 03/09/2016  . DM (diabetes mellitus) ([place]) 02/16/2007  . Cocaine abuse ([place]) 02/16/2007  . Extrinsic asthma 02/16/2007  . HOMELESSNESS, HX OF 02/16/2007    Past Surgical History:  Procedure Laterality Date  . ABDOMINAL HYSTERECTOMY    . CARPAL TUNNEL RELEASE Right   . KNEE ARTHROSCOPY WITH MEDIAL MENISECTOMY Right  04/14/2016   Procedure: KNEE ARTHROSCOPY WITH MEDIAL AND LATERAL MENISECTOMY, MICROFRACTURE REPAIR;  Surgeon: Carole Civil, MD;  Location: [facility];  Service: Orthopedics;  Laterality: Right;  lateral menisectomy - needs crutch training     OB History    Gravida      Para      Term      Preterm      AB      Living  0     SAB      TAB      Ectopic      Multiple      Live Births              Family History  Problem Relation Age of Onset  . Diabetes Mother   . Heart attack Father     Social History   Tobacco Use  . Smoking status: Current Some Day Smoker    Packs/day: 0.25    Types: Cigarettes  . Smokeless tobacco: Never Used  . Tobacco comment: trying to cut down  Substance Use Topics  . Alcohol use: No  . Drug use: No    Home Medications Prior to Admission medications   Medication Sig Start Date End Date Taking? Authorizing Provider  albuterol (PROVENTIL HFA;VENTOLIN HFA) 108 (90 Base) MCG/ACT inhaler Inhale 2 puffs into the lungs every 4 (four) hours as needed. 08/20/17   Rolland Porter, MD  albuterol (PROVENTIL) (2.5 MG/3ML) 0.083% nebulizer solution Take 2.5 mg by nebulization every 6 (six) hours as needed for shortness of breath.     [provider]  amLODipine (NORVASC) 5 MG tablet Take 5 mg by mouth daily.    [provider]  benzonatate (TESSALON) 100 MG capsule Take 1 capsule (100 mg total) by mouth 3 (three) times daily as needed (uncontrolled coughing spells). 06/07/18   Barton Dubois, MD  citalopram (CELEXA) 20 MG tablet Take 20 mg by mouth daily.    [provider]  dextromethorphan-guaiFENesin (MUCINEX DM) 30-600 MG 12hr tablet Take 1 tablet by mouth 2 (two) times daily. 06/07/18   Barton Dubois, MD  diclofenac (VOLTAREN) 75 MG EC tablet TAKE  (1)  TABLET TWICE A DAY. 07/10/19   Carole Civil, MD  divalproex (DEPAKOTE ER) 500 MG 24 hr tablet Take 500-1,000 mg by mouth See admin instructions. 500MG  in the  morning and 1000mg  at bedtime 03/26/18   [provider]  DULoxetine (CYMBALTA) 30 MG capsule Take 1 capsule (30 mg total) by mouth daily. 09/12/18   Suzzanne Cloud, NP  Fluticasone-Salmeterol (ADVAIR DISKUS) 250-50 MCG/DOSE AEPB Inhale 1 puff into the lungs 2 (two) times daily. 06/07/18 06/07/19  Barton Dubois, MD  gabapentin (NEURONTIN) 300 MG capsule Take 3 capsules (900 mg total) by mouth 3 (three) times daily. 10/26/18   Sater, Nanine Means, MD  HYDROcodone-acetaminophen (NORCO/VICODIN) 5-325 MG tablet Take one tab po q 4 hrs prn pain 07/01/19   Triplett, Tammy, PA-C  insulin NPH-regular (NOVOLIN 70/30) (70-30) 100 UNIT/ML injection Inject 50 Units into the skin 3 (three) times daily.     [provider]  lisinopril (PRINIVIL,ZESTRIL) 5 MG tablet Take 5 mg by mouth daily.    [provider]  oxybutynin (DITROPAN) 5 MG tablet Take 5 mg by mouth 2 (two) times daily.    [provider]  pantoprazole (PROTONIX) 40 MG tablet Take 1 tablet (40 mg total) by mouth daily. 06/07/18 06/07/19  Barton Dubois, MD  tiotropium (SPIRIVA HANDIHALER) 18 MCG inhalation capsule Place 1 capsule (18 mcg total) into inhaler and inhale daily. 06/07/18 06/07/19  Barton Dubois, MD  traMADol (ULTRAM) 50 MG tablet TAKE (1) TABLET EVERY SIX HOURS AS NEEDED FOR SEVERE PAIN.    - MAY MAKE DROWSY - 07/10/19   Carole Civil, MD  traZODone (DESYREL) 100 MG tablet Take 200 mg by mouth at bedtime as needed for sleep.     [provider]    Allergies    Aspirin and Ibuprofen  Review of Systems   Review of Systems  All other systems reviewed and are negative.   Physical Exam Updated Vital Signs BP (!) 148/78 (BP Location: Right Arm)   Pulse 72   Temp 97.8 F (36.6 C) (Oral)   Resp 17   Ht 6' (1.829 m)   Wt 135.6 kg   SpO2 97%   BMI 40.55 kg/m   Physical Exam Vitals and nursing note reviewed.  Constitutional:      Appearance: She is well-developed.  HENT:     Head:  Normocephalic.     Mouth/Throat:     Mouth: Mucous membranes are moist.     Comments: Broken lower incisors and canine, swelling gum line Pulmonary:     Effort: Pulmonary effort is normal.  Abdominal:     General: There is no distension.  Musculoskeletal:        General: Normal range of motion.  Cervical back: Normal range of motion.  Neurological:     Mental Status: She is alert and oriented to person, place, and time.  Psychiatric:        Mood and Affect: Mood normal.     ED Results / Procedures / Treatments   Labs (all labs ordered are listed, but only abnormal results are displayed) Labs Reviewed - No data to display  EKG None  Radiology No results found.  Procedures Procedures (including critical care time)  Medications Ordered in ED Medications - No data to display  ED Course  I have reviewed the triage vital signs and the nursing notes.  Pertinent labs & imaging results that were available during my care of the patient were reviewed by me and considered in my medical decision making (see chart for details).    MDM Rules/Calculators/A&P                       Final Clinical Impression(s) / ED Diagnoses Final diagnoses:  Pain, dental    Rx / DC Orders ED Discharge Orders         Ordered    clindamycin (CLEOCIN) 300 MG capsule  3 times daily     07/21/19 1543        An After Visit Summary was printed and given to the patient.   Fransico Meadow, [place] 07/21/19 1544    Milton Ferguson, MD 07/21/19 1601

## 2019-07-22 ENCOUNTER — Ambulatory Visit: Payer: Self-pay | Admitting: Orthopedic Surgery

## 2019-08-02 ENCOUNTER — Encounter (HOSPITAL_COMMUNITY): Payer: Self-pay | Admitting: Emergency Medicine

## 2019-08-02 ENCOUNTER — Emergency Department (HOSPITAL_COMMUNITY): Payer: 59

## 2019-08-02 ENCOUNTER — Ambulatory Visit (HOSPITAL_COMMUNITY): Payer: 59

## 2019-08-02 ENCOUNTER — Observation Stay (HOSPITAL_COMMUNITY)
Admission: EM | Admit: 2019-08-02 | Discharge: 2019-08-05 | DRG: 093 | Disposition: A | Discharge status: Home / Self Care | Payer: 59 | Attending: Internal Medicine | Admitting: Internal Medicine

## 2019-08-02 ENCOUNTER — Other Ambulatory Visit: Payer: Self-pay

## 2019-08-02 ENCOUNTER — Encounter (HOSPITAL_COMMUNITY): Payer: Self-pay

## 2019-08-02 DIAGNOSIS — Z833 Family history of diabetes mellitus: Secondary | ICD-10-CM

## 2019-08-02 DIAGNOSIS — R42 Dizziness and giddiness: Secondary | ICD-10-CM | POA: Diagnosis present

## 2019-08-02 DIAGNOSIS — N1831 Chronic kidney disease, stage 3a: Secondary | ICD-10-CM | POA: Diagnosis present

## 2019-08-02 DIAGNOSIS — Z6837 Body mass index (BMI) 37.0-37.9, adult: Secondary | ICD-10-CM

## 2019-08-02 DIAGNOSIS — Z20822 Contact with and (suspected) exposure to covid-19: Secondary | ICD-10-CM | POA: Diagnosis not present

## 2019-08-02 DIAGNOSIS — F319 Bipolar disorder, unspecified: Secondary | ICD-10-CM | POA: Diagnosis not present

## 2019-08-02 DIAGNOSIS — R32 Unspecified urinary incontinence: Secondary | ICD-10-CM | POA: Diagnosis not present

## 2019-08-02 DIAGNOSIS — E1122 Type 2 diabetes mellitus with diabetic chronic kidney disease: Secondary | ICD-10-CM | POA: Diagnosis not present

## 2019-08-02 DIAGNOSIS — D631 Anemia in chronic kidney disease: Secondary | ICD-10-CM | POA: Diagnosis not present

## 2019-08-02 DIAGNOSIS — R471 Dysarthria and anarthria: Principal | ICD-10-CM | POA: Diagnosis present

## 2019-08-02 DIAGNOSIS — Z886 Allergy status to analgesic agent status: Secondary | ICD-10-CM

## 2019-08-02 DIAGNOSIS — M79642 Pain in left hand: Secondary | ICD-10-CM

## 2019-08-02 DIAGNOSIS — K219 Gastro-esophageal reflux disease without esophagitis: Secondary | ICD-10-CM | POA: Diagnosis present

## 2019-08-02 DIAGNOSIS — F419 Anxiety disorder, unspecified: Secondary | ICD-10-CM | POA: Diagnosis present

## 2019-08-02 DIAGNOSIS — J449 Chronic obstructive pulmonary disease, unspecified: Secondary | ICD-10-CM | POA: Diagnosis not present

## 2019-08-02 DIAGNOSIS — Z880 Allergy status to penicillin: Secondary | ICD-10-CM

## 2019-08-02 DIAGNOSIS — R4781 Slurred speech: Secondary | ICD-10-CM | POA: Diagnosis present

## 2019-08-02 DIAGNOSIS — F17210 Nicotine dependence, cigarettes, uncomplicated: Secondary | ICD-10-CM | POA: Diagnosis present

## 2019-08-02 DIAGNOSIS — I129 Hypertensive chronic kidney disease with stage 1 through stage 4 chronic kidney disease, or unspecified chronic kidney disease: Secondary | ICD-10-CM | POA: Diagnosis present

## 2019-08-02 DIAGNOSIS — Z7982 Long term (current) use of aspirin: Secondary | ICD-10-CM

## 2019-08-02 DIAGNOSIS — Z79890 Hormone replacement therapy (postmenopausal): Secondary | ICD-10-CM

## 2019-08-02 DIAGNOSIS — R278 Other lack of coordination: Secondary | ICD-10-CM

## 2019-08-02 DIAGNOSIS — G8929 Other chronic pain: Secondary | ICD-10-CM | POA: Diagnosis present

## 2019-08-02 DIAGNOSIS — M25562 Pain in left knee: Secondary | ICD-10-CM | POA: Diagnosis present

## 2019-08-02 DIAGNOSIS — G459 Transient cerebral ischemic attack, unspecified: Secondary | ICD-10-CM

## 2019-08-02 DIAGNOSIS — M25632 Stiffness of left wrist, not elsewhere classified: Secondary | ICD-10-CM

## 2019-08-02 DIAGNOSIS — Z794 Long term (current) use of insulin: Secondary | ICD-10-CM

## 2019-08-02 DIAGNOSIS — E1142 Type 2 diabetes mellitus with diabetic polyneuropathy: Secondary | ICD-10-CM | POA: Diagnosis present

## 2019-08-02 DIAGNOSIS — Z79899 Other long term (current) drug therapy: Secondary | ICD-10-CM

## 2019-08-02 DIAGNOSIS — E119 Type 2 diabetes mellitus without complications: Secondary | ICD-10-CM

## 2019-08-02 DIAGNOSIS — R29898 Other symptoms and signs involving the musculoskeletal system: Secondary | ICD-10-CM

## 2019-08-02 DIAGNOSIS — Z7951 Long term (current) use of inhaled steroids: Secondary | ICD-10-CM

## 2019-08-02 DIAGNOSIS — M25642 Stiffness of left hand, not elsewhere classified: Secondary | ICD-10-CM

## 2019-08-02 DIAGNOSIS — M25532 Pain in left wrist: Secondary | ICD-10-CM

## 2019-08-02 DIAGNOSIS — D72829 Elevated white blood cell count, unspecified: Secondary | ICD-10-CM | POA: Diagnosis present

## 2019-08-02 LAB — CBC WITH DIFFERENTIAL/PLATELET
Abs Immature Granulocytes: 0.05 10*3/uL (ref 0.00–0.07)
Basophils Absolute: 0.1 10*3/uL (ref 0.0–0.1)
Basophils Relative: 0 %
Eosinophils Absolute: 0 10*3/uL (ref 0.0–0.5)
Eosinophils Relative: 0 %
HCT: 37 % (ref 36.0–46.0)
Hemoglobin: 11.8 g/dL — ABNORMAL LOW (ref 12.0–15.0)
Immature Granulocytes: 0 %
Lymphocytes Relative: 34 %
Lymphs Abs: 4.9 10*3/uL — ABNORMAL HIGH (ref 0.7–4.0)
MCH: 27.9 pg (ref 26.0–34.0)
MCHC: 31.9 g/dL (ref 30.0–36.0)
MCV: 87.5 fL (ref 80.0–100.0)
Monocytes Absolute: 1.8 10*3/uL — ABNORMAL HIGH (ref 0.1–1.0)
Monocytes Relative: 12 %
Neutro Abs: 7.6 10*3/uL (ref 1.7–7.7)
Neutrophils Relative %: 54 %
Platelets: 277 10*3/uL (ref 150–400)
RBC: 4.23 MIL/uL (ref 3.87–5.11)
RDW: 14.2 % (ref 11.5–15.5)
WBC: 14.5 10*3/uL — ABNORMAL HIGH (ref 4.0–10.5)
nRBC: 0 % (ref 0.0–0.2)

## 2019-08-02 LAB — BASIC METABOLIC PANEL
Anion gap: 11 (ref 5–15)
BUN: 16 mg/dL (ref 8–23)
CO2: 24 mmol/L (ref 22–32)
Calcium: 8.4 mg/dL — ABNORMAL LOW (ref 8.9–10.3)
Chloride: 96 mmol/L — ABNORMAL LOW (ref 98–111)
Creatinine, Ser: 1.36 mg/dL — ABNORMAL HIGH (ref 0.44–1.00)
GFR calc Af Amer: 48 mL/min — ABNORMAL LOW (ref >60)
GFR calc non Af Amer: 42 mL/min — ABNORMAL LOW (ref >60)
Glucose, Bld: 301 mg/dL — ABNORMAL HIGH (ref 70–99)
Potassium: 3.5 mmol/L (ref 3.5–5.1)
Sodium: 131 mmol/L — ABNORMAL LOW (ref 135–145)

## 2019-08-02 MED ORDER — LORAZEPAM 2 MG/ML IJ SOLN
0.5000 mg | Freq: Once | INTRAMUSCULAR | Status: AC
  Administered 2019-08-03: 0.5 mg via INTRAVENOUS
  Filled 2019-08-02: qty 1

## 2019-08-02 MED ORDER — SODIUM CHLORIDE 0.9 % IV BOLUS
500.0000 mL | Freq: Once | INTRAVENOUS | Status: AC
  Administered 2019-08-03: 500 mL via INTRAVENOUS

## 2019-08-02 MED ORDER — MECLIZINE HCL 12.5 MG PO TABS
25.0000 mg | ORAL_TABLET | Freq: Once | ORAL | Status: AC
  Administered 2019-08-03: 25 mg via ORAL
  Filled 2019-08-02: qty 2

## 2019-08-02 NOTE — ED Provider Notes (Signed)
[facility] EMERGENCY DEPARTMENT Provider Note   CSN: [id number] Arrival date & time: 08/02/19  2241     History Chief Complaint  Patient presents with  . Dizziness    Rhonda Hickman is a 64 y.o. female.  Patient presents to the emergency department for evaluation of several weeks of worsening dizziness.  Patient reports that over the last 3 days, however, her dizziness has become severe.  Every time she tries to stand up she comes extremely dizzy and falls back, cannot get up and move around.  She is even having trouble getting to the bathroom and has not been able to take her medications over the last 3 days because she cannot get up to get them.  She is not having any headache.  There is no vision change or hearing change.        Past Medical History:  Diagnosis Date  . Anxiety   . Asthma   . Bipolar 1 disorder ([place])   . Bulging lumbar disc   . Chronic pain of left knee   . COPD (chronic obstructive pulmonary disease) ([place])   . Diabetes mellitus   . Neuropathy     Patient Active Problem List   Diagnosis Date Noted  . Bronchiectasis with acute exacerbation ([place])   . Obesity, Class III, BMI 40-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) ([place])   . Gastroesophageal reflux disease   . Asthma exacerbation 06/06/2018  . Gait abnormality 08/29/2017  . Urinary incontinence 08/29/2017  . Paresthesia 08/29/2017  . Derangement of posterior horn of medial meniscus of right knee   . Meniscus, lateral, derangement, right   . Arthritis of knee, right   . COPD with acute exacerbation ([place]) 03/09/2016  . DM (diabetes mellitus) ([place]) 02/16/2007  . Cocaine abuse ([place]) 02/16/2007  . Extrinsic asthma 02/16/2007  . HOMELESSNESS, HX OF 02/16/2007    Past Surgical History:  Procedure Laterality Date  . ABDOMINAL HYSTERECTOMY    . CARPAL TUNNEL RELEASE Right   . KNEE ARTHROSCOPY WITH MEDIAL MENISECTOMY Right 04/14/2016   Procedure: KNEE ARTHROSCOPY WITH MEDIAL AND LATERAL MENISECTOMY, MICROFRACTURE  REPAIR;  Surgeon: Carole Civil, MD;  Location: [facility];  Service: Orthopedics;  Laterality: Right;  lateral menisectomy - needs crutch training     OB History    Gravida      Para      Term      Preterm      AB      Living  0     SAB      TAB      Ectopic      Multiple      Live Births              Family History  Problem Relation Age of Onset  . Diabetes Mother   . Heart attack Father     Social History   Tobacco Use  . Smoking status: Current Some Day Smoker    Packs/day: 0.25    Types: Cigarettes  . Smokeless tobacco: Never Used  . Tobacco comment: trying to cut down  Substance Use Topics  . Alcohol use: No  . Drug use: No    Home Medications Prior to Admission medications   Medication Sig Start Date End Date Taking? Authorizing Provider  albuterol (PROVENTIL HFA;VENTOLIN HFA) 108 (90 Base) MCG/ACT inhaler Inhale 2 puffs into the lungs every 4 (four) hours as needed. 08/20/17   Rolland Porter, MD  albuterol (PROVENTIL) (2.5 MG/3ML) 0.083% nebulizer solution Take 2.5  mg by nebulization every 6 (six) hours as needed for shortness of breath.     [provider]  amLODipine (NORVASC) 5 MG tablet Take 5 mg by mouth daily.    [provider]  benzonatate (TESSALON) 100 MG capsule Take 1 capsule (100 mg total) by mouth 3 (three) times daily as needed (uncontrolled coughing spells). 06/07/18   Barton Dubois, MD  citalopram (CELEXA) 20 MG tablet Take 20 mg by mouth daily.    [provider]  dextromethorphan-guaiFENesin (MUCINEX DM) 30-600 MG 12hr tablet Take 1 tablet by mouth 2 (two) times daily. 06/07/18   Barton Dubois, MD  diclofenac (VOLTAREN) 75 MG EC tablet TAKE  (1)  TABLET TWICE A DAY. 07/10/19   Carole Civil, MD  divalproex (DEPAKOTE ER) 500 MG 24 hr tablet Take 500-1,000 mg by mouth See admin instructions. 500MG  in the morning and 1000mg  at bedtime 03/26/18   [provider]  DULoxetine (CYMBALTA) 30 MG  capsule Take 1 capsule (30 mg total) by mouth daily. 09/12/18   Suzzanne Cloud, NP  Fluticasone-Salmeterol (ADVAIR DISKUS) 250-50 MCG/DOSE AEPB Inhale 1 puff into the lungs 2 (two) times daily. 06/07/18 06/07/19  Barton Dubois, MD  gabapentin (NEURONTIN) 300 MG capsule Take 3 capsules (900 mg total) by mouth 3 (three) times daily. 10/26/18   Sater, Nanine Means, MD  HYDROcodone-acetaminophen (NORCO/VICODIN) 5-325 MG tablet Take one tab po q 4 hrs prn pain 07/01/19   Triplett, Tammy, PA-C  insulin NPH-regular (NOVOLIN 70/30) (70-30) 100 UNIT/ML injection Inject 50 Units into the skin 3 (three) times daily.     [provider]  lisinopril (PRINIVIL,ZESTRIL) 5 MG tablet Take 5 mg by mouth daily.    [provider]  oxybutynin (DITROPAN) 5 MG tablet Take 5 mg by mouth 2 (two) times daily.    [provider]  pantoprazole (PROTONIX) 40 MG tablet Take 1 tablet (40 mg total) by mouth daily. 06/07/18 06/07/19  Barton Dubois, MD  tiotropium (SPIRIVA HANDIHALER) 18 MCG inhalation capsule Place 1 capsule (18 mcg total) into inhaler and inhale daily. 06/07/18 06/07/19  Barton Dubois, MD  traMADol (ULTRAM) 50 MG tablet TAKE (1) TABLET EVERY SIX HOURS AS NEEDED FOR SEVERE PAIN.    - MAY MAKE DROWSY - 07/10/19   Carole Civil, MD  traZODone (DESYREL) 100 MG tablet Take 200 mg by mouth at bedtime as needed for sleep.     [provider]    Allergies    Aspirin and Ibuprofen  Review of Systems   Review of Systems  Neurological: Positive for dizziness.  All other systems reviewed and are negative.   Physical Exam Updated Vital Signs BP 139/76   Pulse 85   Temp 99 F (37.2 C)   Resp 16   Ht 6\' 3"  (1.905 m)   Wt 135.6 kg   SpO2 95%   BMI 37.37 kg/m   Physical Exam Vitals and nursing note reviewed.  Constitutional:      General: She is not in acute distress.    Appearance: Normal appearance. She is well-developed.  HENT:     Head: Normocephalic and  atraumatic.     Right Ear: Hearing normal.     Left Ear: Hearing normal.     Nose: Nose normal.  Eyes:     Conjunctiva/sclera: Conjunctivae normal.     Pupils: Pupils are equal, round, and reactive to light.  Cardiovascular:     Rate and Rhythm: Regular rhythm.  Heart sounds: S1 normal and S2 normal. No murmur. No friction rub. No gallop.   Pulmonary:     Effort: Pulmonary effort is normal. No respiratory distress.     Breath sounds: Normal breath sounds.  Chest:     Chest wall: No tenderness.  Abdominal:     General: Bowel sounds are normal.     Palpations: Abdomen is soft.     Tenderness: There is no abdominal tenderness. There is no guarding or rebound. Negative signs include Murphy's sign and McBurney's sign.     Hernia: No hernia is present.  Musculoskeletal:        General: Normal range of motion.     Cervical back: Normal range of motion and neck supple.  Skin:    General: Skin is warm and dry.     Findings: No rash.  Neurological:     Mental Status: She is alert and oriented to person, place, and time.     GCS: GCS eye subscore is 4. GCS verbal subscore is 5. GCS motor subscore is 6.     Cranial Nerves: No cranial nerve deficit.     Sensory: No sensory deficit.     Coordination: Coordination normal.  Psychiatric:        Speech: Speech normal.        Behavior: Behavior normal.        Thought Content: Thought content normal.     ED Results / Procedures / Treatments   Labs (all labs ordered are listed, but only abnormal results are displayed) Labs Reviewed  CBC WITH DIFFERENTIAL/PLATELET - Abnormal; Notable for the following components:      Result Value   WBC 14.5 (*)    Hemoglobin 11.8 (*)    Lymphs Abs 4.9 (*)    Monocytes Absolute 1.8 (*)    All other components within normal limits  BASIC METABOLIC PANEL - Abnormal; Notable for the following components:   Sodium 131 (*)    Chloride 96 (*)    Glucose, Bld 301 (*)    Creatinine, Ser 1.36 (*)     Calcium 8.4 (*)    GFR calc non Af Amer 42 (*)    GFR calc Af Amer 48 (*)    All other components within normal limits  URINALYSIS, ROUTINE W REFLEX MICROSCOPIC    EKG EKG Interpretation  Date/Time:  Friday August 02 2019 22:54:48 EST Ventricular Rate:  83 PR Interval:    QRS Duration: 73 QT Interval:  306 QTC Calculation: 360 R Axis:   40 Text Interpretation: Sinus rhythm Baseline wander in lead(s) V2 Confirmed by Orpah Greek [phone]) on 08/02/2019 11:33:53 PM   Radiology CT HEAD WO CONTRAST  Result Date: 08/03/2019 CLINICAL DATA:  Vertigo. Dizziness and slurred speech. Weakness. EXAM: CT HEAD WITHOUT CONTRAST TECHNIQUE: Contiguous axial images were obtained from the base of the skull through the vertex without intravenous contrast. COMPARISON:  Head CT 01/17/2018 FINDINGS: Brain: No evidence of acute infarction, hemorrhage, hydrocephalus, extra-axial collection or mass lesion/mass effect. Brain volume is normal for age. Minimal chronic small vessel ischemia. Vascular: Atherosclerosis of skullbase vasculature without hyperdense vessel or abnormal calcification. Skull: No fracture or focal lesion. Sinuses/Orbits: Paranasal sinuses and mastoid air cells are clear. The visualized orbits are unremarkable. Other: None. IMPRESSION: No acute intracranial abnormality. Mild chronic small vessel ischemia, stable from prior. Electronically Signed   By: Keith Rake M.D.   On: 08/03/2019 01:08    Procedures Procedures (including critical care time)  Medications  Ordered in ED Medications  sodium chloride 0.9 % bolus 500 mL (0 mLs Intravenous Stopped 08/03/19 0140)  LORazepam (ATIVAN) injection 0.5 mg (0.5 mg Intravenous Given 08/03/19 0001)  meclizine (ANTIVERT) tablet 25 mg (25 mg Oral Given 08/03/19 0001)    ED Course  I have reviewed the triage vital signs and the nursing notes.  Pertinent labs & imaging results that were available during my care of the patient were reviewed  by me and considered in my medical decision making (see chart for details).    MDM Rules/Calculators/A&P                     Patient presents to the emergency department for evaluation of dizziness.  Patient reports that symptoms have been ongoing for a couple of weeks but in the last 3 days her dizziness has severely worsened.  She has not been able to get out of bed to take her medications or to even go to the bathroom because she reports that she would fall.  Neurologic examination is nonfocal here in the ER.  She was given IV fluids, meclizine, Ativan.  Head CT is normal.  She is still unable to get up and walk on her own because of severe dizziness.  We will therefore place in hospital for further work-up.  She does not require urgent work-up as symptoms have been ongoing for days. Final Clinical Impression(s) / ED Diagnoses Final diagnoses:  Vertigo    Rx / DC Orders ED Discharge Orders    None       Other Atienza, Gwenyth Allegra, MD 08/03/19 [phone]

## 2019-08-02 NOTE — ED Provider Notes (Signed)
[facility] EMERGENCY DEPARTMENT Provider Note   CSN: [id number] Arrival date & time: 08/02/19  2241     History Chief Complaint  Patient presents with  . Dizziness    Rhonda Hickman is a 64 y.o. female.  HPI     Past Medical History:  Diagnosis Date  . Anxiety   . Asthma   . Bipolar 1 disorder ([place])   . Bulging lumbar disc   . Chronic pain of left knee   . COPD (chronic obstructive pulmonary disease) ([place])   . Diabetes mellitus   . Neuropathy     Patient Active Problem List   Diagnosis Date Noted  . Vertigo 08/03/2019  . Slurred speech 08/03/2019  . Leukocytosis 08/03/2019  . Bronchiectasis with acute exacerbation ([place])   . Obesity, Class III, BMI 40-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) ([place])   . Gastroesophageal reflux disease   . Asthma exacerbation 06/06/2018  . Gait abnormality 08/29/2017  . Urinary incontinence 08/29/2017  . Paresthesia 08/29/2017  . Derangement of posterior horn of medial meniscus of right knee   . Meniscus, lateral, derangement, right   . Arthritis of knee, right   . COPD with acute exacerbation ([place]) 03/09/2016  . Type 2 diabetes mellitus ([place]) 02/16/2007  . Cocaine abuse ([place]) 02/16/2007  . Extrinsic asthma 02/16/2007  . HOMELESSNESS, HX OF 02/16/2007    Past Surgical History:  Procedure Laterality Date  . ABDOMINAL HYSTERECTOMY    . CARPAL TUNNEL RELEASE Right   . KNEE ARTHROSCOPY WITH MEDIAL MENISECTOMY Right 04/14/2016   Procedure: KNEE ARTHROSCOPY WITH MEDIAL AND LATERAL MENISECTOMY, MICROFRACTURE REPAIR;  Surgeon: Carole Civil, MD;  Location: [facility];  Service: Orthopedics;  Laterality: Right;  lateral menisectomy - needs crutch training     OB History    Gravida      Para      Term      Preterm      AB      Living  0     SAB      TAB      Ectopic      Multiple      Live Births              Family History  Problem Relation Age of Onset  . Diabetes Mother   . Heart attack Father     Social History    Tobacco Use  . Smoking status: Current Some Day Smoker    Packs/day: 0.25    Types: Cigarettes  . Smokeless tobacco: Never Used  . Tobacco comment: trying to cut down  Substance Use Topics  . Alcohol use: No  . Drug use: No    Home Medications Prior to Admission medications   Medication Sig Start Date End Date Taking? Authorizing Provider  albuterol (PROVENTIL HFA;VENTOLIN HFA) 108 (90 Base) MCG/ACT inhaler Inhale 2 puffs into the lungs every 4 (four) hours as needed. 08/20/17   Rolland Porter, MD  albuterol (PROVENTIL) (2.5 MG/3ML) 0.083% nebulizer solution Take 2.5 mg by nebulization every 6 (six) hours as needed for shortness of breath.     [provider]  amLODipine (NORVASC) 5 MG tablet Take 5 mg by mouth daily.    [provider]  benzonatate (TESSALON) 100 MG capsule Take 1 capsule (100 mg total) by mouth 3 (three) times daily as needed (uncontrolled coughing spells). 06/07/18   Barton Dubois, MD  citalopram (CELEXA) 20 MG tablet Take 20 mg by mouth daily.    [provider]  dextromethorphan-guaiFENesin (MUCINEX DM) 30-600 MG 12hr tablet Take 1 tablet by mouth 2 (two) times daily. 06/07/18   Barton Dubois, MD  diclofenac (VOLTAREN) 75 MG EC tablet TAKE  (1)  TABLET TWICE A DAY. 07/10/19   Carole Civil, MD  divalproex (DEPAKOTE ER) 500 MG 24 hr tablet Take 500-1,000 mg by mouth See admin instructions. 500MG  in the morning and 1000mg  at bedtime 03/26/18   [provider]  DULoxetine (CYMBALTA) 30 MG capsule Take 1 capsule (30 mg total) by mouth daily. 09/12/18   Suzzanne Cloud, NP  Fluticasone-Salmeterol (ADVAIR DISKUS) 250-50 MCG/DOSE AEPB Inhale 1 puff into the lungs 2 (two) times daily. 06/07/18 06/07/19  Barton Dubois, MD  gabapentin (NEURONTIN) 300 MG capsule Take 3 capsules (900 mg total) by mouth 3 (three) times daily. 10/26/18   Sater, Nanine Means, MD  HYDROcodone-acetaminophen (NORCO/VICODIN) 5-325 MG tablet Take one tab po q 4 hrs prn  pain 07/01/19   Triplett, Tammy, PA-C  insulin NPH-regular (NOVOLIN 70/30) (70-30) 100 UNIT/ML injection Inject 50 Units into the skin 3 (three) times daily.     [provider]  lisinopril (PRINIVIL,ZESTRIL) 5 MG tablet Take 5 mg by mouth daily.    [provider]  oxybutynin (DITROPAN) 5 MG tablet Take 5 mg by mouth 2 (two) times daily.    [provider]  pantoprazole (PROTONIX) 40 MG tablet Take 1 tablet (40 mg total) by mouth daily. 06/07/18 06/07/19  Barton Dubois, MD  tiotropium (SPIRIVA HANDIHALER) 18 MCG inhalation capsule Place 1 capsule (18 mcg total) into inhaler and inhale daily. 06/07/18 06/07/19  Barton Dubois, MD  traMADol (ULTRAM) 50 MG tablet TAKE (1) TABLET EVERY SIX HOURS AS NEEDED FOR SEVERE PAIN.    - MAY MAKE DROWSY - 07/10/19   Carole Civil, MD  traZODone (DESYREL) 100 MG tablet Take 200 mg by mouth at bedtime as needed for sleep.     [provider]    Allergies    Aspirin and Ibuprofen  Review of Systems   Review of Systems  Physical Exam Updated Vital Signs BP 127/75   Pulse 74   Temp 99 F (37.2 C)   Resp 20   Ht 6\' 3"  (1.905 m)   Wt 135.6 kg   SpO2 95%   BMI 37.37 kg/m   Physical Exam  ED Results / Procedures / Treatments   Labs (all labs ordered are listed, but only abnormal results are displayed) Labs Reviewed  CBC WITH DIFFERENTIAL/PLATELET - Abnormal; Notable for the following components:      Result Value   WBC 14.5 (*)    Hemoglobin 11.8 (*)    Lymphs Abs 4.9 (*)    Monocytes Absolute 1.8 (*)    All other components within normal limits  BASIC METABOLIC PANEL - Abnormal; Notable for the following components:   Sodium 131 (*)    Chloride 96 (*)    Glucose, Bld 301 (*)    Creatinine, Ser 1.36 (*)    Calcium 8.4 (*)    GFR calc non Af Amer 42 (*)    GFR calc Af Amer 48 (*)    All other components within normal limits  RESPIRATORY PANEL BY RT PCR (FLU A&B, COVID)  URINALYSIS, ROUTINE W  REFLEX MICROSCOPIC  HEMOGLOBIN A1C  POC SARS CORONAVIRUS 2 AG -  ED    EKG EKG Interpretation  Date/Time:  Friday August 02 2019 22:54:48 EST Ventricular Rate:  83 PR Interval:    QRS Duration:  73 QT Interval:  306 QTC Calculation: 360 R Axis:   40 Text Interpretation: Sinus rhythm Baseline wander in lead(s) V2 Confirmed by Orpah Greek [phone]) on 08/02/2019 11:33:53 PM   Radiology CT HEAD WO CONTRAST  Result Date: 08/03/2019 CLINICAL DATA:  Vertigo. Dizziness and slurred speech. Weakness. EXAM: CT HEAD WITHOUT CONTRAST TECHNIQUE: Contiguous axial images were obtained from the base of the skull through the vertex without intravenous contrast. COMPARISON:  Head CT 01/17/2018 FINDINGS: Brain: No evidence of acute infarction, hemorrhage, hydrocephalus, extra-axial collection or mass lesion/mass effect. Brain volume is normal for age. Minimal chronic small vessel ischemia. Vascular: Atherosclerosis of skullbase vasculature without hyperdense vessel or abnormal calcification. Skull: No fracture or focal lesion. Sinuses/Orbits: Paranasal sinuses and mastoid air cells are clear. The visualized orbits are unremarkable. Other: None. IMPRESSION: No acute intracranial abnormality. Mild chronic small vessel ischemia, stable from prior. Electronically Signed   By: Keith Rake M.D.   On: 08/03/2019 01:08    Procedures Procedures (including critical care time)  Medications Ordered in ED Medications  meclizine (ANTIVERT) tablet 25 mg (has no administration in time range)   stroke: mapping our early stages of recovery book (has no administration in time range)  acetaminophen (TYLENOL) tablet 650 mg (has no administration in time range)    Or  acetaminophen (TYLENOL) suppository 650 mg (has no administration in time range)  ondansetron (ZOFRAN) tablet 4 mg (has no administration in time range)    Or  ondansetron (ZOFRAN) injection 4 mg (has no administration in time range)  aspirin  EC tablet 325 mg (has no administration in time range)  famotidine (PEPCID) tablet 20 mg (has no administration in time range)  insulin aspart (novoLOG) injection 0-20 Units (has no administration in time range)  sodium chloride 0.9 % bolus 500 mL (0 mLs Intravenous Stopped 08/03/19 0140)  LORazepam (ATIVAN) injection 0.5 mg (0.5 mg Intravenous Given 08/03/19 0001)  meclizine (ANTIVERT) tablet 25 mg (25 mg Oral Given 08/03/19 0001)    ED Course  I have reviewed the triage vital signs and the nursing notes.  Pertinent labs & imaging results that were available during my care of the patient were reviewed by me and considered in my medical decision making (see chart for details).    MDM Rules/Calculators/A&P                      Patient presents to the emergency department for evaluation of dizziness.  She reports that she has been dizzy for several weeks but in the last 3 days symptoms have worsened.  Dizziness has become so severe that she has not been able to get out of bed.  This has caused her to not take her medications for the last 3 days.  Work-up has been unrevealing here.  She does not have any focal findings on neurologic exam.  CT head does not show any acute findings.  Patient understood IV fluids, Ativan, meclizine.  Repeat examination reveals no change.  Will require hospitalization for further management and work-up including MRI. Final Clinical Impression(s) / ED Diagnoses Final diagnoses:  Vertigo    Rx / DC Orders ED Discharge Orders    None       Kristl Morioka, Gwenyth Allegra, MD 08/03/19 [phone]

## 2019-08-02 NOTE — Therapy (Signed)
[place], [place], [address] Phone: [phone]   Fax:  [phone]  Occupational Therapy Evaluation  Patient Details  Name: Rhonda Hickman MRN: [id number] Date of Birth: 06/11/1956 Referring Provider (OT): Dr. Arther Abbott   Encounter Date: 08/02/2019  OT End of Session - 08/02/19 1206    Visit Number  1    Number of Visits  10    Date for OT Re-Evaluation  09/06/19    Authorization Type  [employer] 100% financial assistance    Authorization Time Period  03/07/19-09/07/19    OT Start Time  0820    OT Stop Time  [id number]    OT Time Calculation (min)  38 min    Activity Tolerance  Patient tolerated treatment well;Patient limited by pain    Behavior During Therapy  --   sleepy. Several times had eyes closed towards end of evaluation. yawning continuously.      Past Medical History:  Diagnosis Date  . Anxiety   . Asthma   . Bipolar 1 disorder ([place])   . Bulging lumbar disc   . Chronic pain of left knee   . COPD (chronic obstructive pulmonary disease) ([place])   . Diabetes mellitus   . Neuropathy     Past Surgical History:  Procedure Laterality Date  . ABDOMINAL HYSTERECTOMY    . CARPAL TUNNEL RELEASE Right   . KNEE ARTHROSCOPY WITH MEDIAL MENISECTOMY Right 04/14/2016   Procedure: KNEE ARTHROSCOPY WITH MEDIAL AND LATERAL MENISECTOMY, MICROFRACTURE REPAIR;  Surgeon: Carole Civil, MD;  Location: [facility];  Service: Orthopedics;  Laterality: Right;  lateral menisectomy - needs crutch training    There were no vitals filed for this visit.  Subjective Assessment - 08/02/19 0827    Subjective   S: I can't do anything with this hand.    Pertinent History  Patient is a 64 y/o female S/P left hand/wrist pain which occured from a fall approximately 2 months ago. X-ray detected no fracture or dislocation. patient was told it was a bad sprain. She received a fabricated hand splint at this clinic last month although reports  that she does not like it and it causes pain. She has not been wearing it. She initially preferred to receive therapy in [place] but was unable to locate any place that provides it. She returns to clinic with Dr. Aline Brochure referring patient for occupational therapy to evaluate and treatment.    Patient Stated Goals  To make her hand better and be able to use it.    Currently in Pain?  Yes    Pain Score  9     Pain Location  Wrist    Pain Orientation  Left    Pain Descriptors / Indicators  Pins and needles;Numbness;Constant    Pain Type  Acute pain    Pain Radiating Towards  hand up to elbow    Pain Onset  More than a month ago    Pain Frequency  Constant    Aggravating Factors   movement and use    Pain Relieving Factors  rest and sleep    Effect of Pain on Daily Activities  severe effect    Multiple Pain Sites  No        [facility] OT Assessment - 08/02/19 0828      Assessment   Medical Diagnosis  Left Wrist Pain    Referring Provider (OT)  Dr. Arther Abbott    Onset Date/Surgical Date  --  approximately 2 months ago   Hand Dominance  Right    Next MD Visit  08/21/19    Prior Therapy  Pt received 1 OT visit at clinic for splint fabrication      Precautions   Precautions  None      Restrictions   Weight Bearing Restrictions  No      Balance Screen   Has the patient fallen in the past 6 months  Yes    How many times?  1    Has the patient had a decrease in activity level because of a fear of falling?   No    Is the patient reluctant to leave their home because of a fear of falling?   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 Not formally assessed.       Coordination   Gross Motor Movements are Fluid and Coordinated  No    Fine Motor Movements are Fluid and Coordinated  No    9 Hole Peg Test  Left;Right    Right 9 Hole Peg Test  29.6"    Left 9 Hole Peg Test  40.9"      Edema   Edema  right and left wrist crease: 19.0 cm, right and left MCP joints: 22.0 cm   reissued large left edema glove per patient's request      ROM / Strength   AROM / PROM / Strength  AROM;PROM;Strength      AROM   AROM Assessment Site  Wrist;Forearm;Finger;Thumb    Right/Left Forearm  Left    Left Forearm Pronation  90 Degrees    Left Forearm Supination  90 Degrees    Right/Left Wrist  Left    Left Wrist Extension  50 Degrees    Left Wrist Flexion  62 Degrees    Left Wrist Radial Deviation  30 Degrees    Left Wrist Ulnar Deviation  22 Degrees    Right/Left Finger  Left    Left Composite Finger Extension  75%    Left Composite Finger Flexion  25%    Right/Left Thumb  Left    Left Thumb Opposition  Digit 4      PROM   PROM Assessment Site  Forearm;Wrist;Finger    Right/Left Forearm  Left    Left Forearm Pronation  90 Degrees    Left Forearm Supination  90 Degrees    Right/Left Wrist  Left    Left Wrist Extension  66 Degrees    Left Wrist  Flexion  74 Degrees    Left Wrist Radial Deviation  30 Degrees    Left Wrist Ulnar Deviation  30 Degrees      Strength   Strength Assessment Site  Hand    Right/Left hand  Left;Right    Right Hand Grip (lbs)  45    Right Hand Lateral Pinch  19 lbs    Right Hand 3 Point Pinch  10 lbs    Left Hand Grip (lbs)  0     Left Hand Lateral Pinch  12 lbs    Left Hand 3 Point Pinch  3 lbs      Left Hand AROM   L Index  MCP 0-90  74 Degrees    L Index PIP 0-100  68 Degrees    L Index DIP 0-70  20 Degrees    L Long  MCP 0-90  66 Degrees    L Long PIP 0-100  68 Degrees    L Long DIP 0-70  0 Degrees    L Ring  MCP 0-90  58 Degrees    L Ring PIP 0-100  56 Degrees    L Ring DIP 0-70  0 Degrees    L Little  MCP 0-90  76 Degrees    L Little PIP 0-100  68 Degrees    L Little DIP 0-70  38 Degrees                      OT Education - 08/02/19 1205    Education Details  discussed therapy plan and goals. Provided HEP: hand P/ROM and A/ROM. Provided another edema glove per patient's request as she reports that it helps with the pain and her previous one was taken when at work. Education provided on purchasing a wrist brace from local pharmacy or store if needed to provide pain relief. Encourage patient to only wear it when needed so she can continue to use her left hand as much as possible.    Person(s) Educated  Patient    Methods  Explanation;Handout;Demonstration    Comprehension  Verbalized understanding       OT Short Term Goals - 08/02/19 1212      OT SHORT TERM GOAL #1   Title  Patient will be educated and independent with HEP in order to faciliate her progress in therapy and allow her to return to using her left hand as her nondominant extremity for 50% of her daily and work tasks.    Time  5    Period  Weeks    Status  New    Target Date  09/06/19      OT SHORT TERM GOAL #2   Title  Patient will increase her A/ROM of her left hand and wrist to [facility] in order to form a complete fist while holding onto small-medium size objects on a daily basis.    Time  5    Period  Weeks    Status  New      OT SHORT TERM GOAL #3   Title  Patient will increase her left hand coordination by completing the 9 hole peg test in 35 seconds or less.    Time  5    Period  Weeks    Status  New      OT SHORT  TERM GOAL #4   Title  Patient will increase her left grip strength by 10# and her pinch strength by 5# in order to grasp  and hold onto normal household items without droppping.    Time  5    Period  Weeks    Status  New      OT SHORT TERM GOAL #5   Title  Pt will report a decrease in pain of approximately 5/10 or less when completing daily and work related tasks.    Time  5    Period  Weeks    Status  New               Plan - 08/02/19 1208    Clinical Impression Statement  A: Patient is a 64 y/o female S/P left wrist/hand pain causing increased fascial restrictions, pain, and decreased ROM, strength, and coordination resulting in inability to utilize her Left hand for any functional task.    OT Occupational Profile and History  Problem Focused Assessment - Including review of records relating to presenting problem    Occupational performance deficits (Please refer to evaluation for details):  ADL's;IADL's;Rest and Sleep;Work;Social Participation    Body Structure / Function / Physical Skills  ADL;ROM;IADL;Strength;Coordination;FMC;GMC;Pain;UE functional use;Flexibility;Fascial restriction;Decreased knowledge of use of DME    Rehab Potential  Good    Clinical Decision Making  Several treatment options, min-mod task modification necessary    Comorbidities Affecting Occupational Performance:  May have comorbidities impacting occupational performance    Modification or Assistance to Complete Evaluation   Min-Moderate modification of tasks or assist with assess necessary to complete eval    OT Frequency  2x / week    OT Duration  Other (comment)   5 weeks   OT Treatment/Interventions  Self-care/ADL training;Therapeutic exercise;Manual Therapy;Neuromuscular education;Ultrasound;Therapeutic activities;DME and/or AE instruction;Cryotherapy;Electrical Stimulation;Moist Heat;Passive range of motion;Patient/family education    Plan  P: Patient will benefit from skilled OT services to increase  functional performance during daily and work related tasks while using her LUE. Treatment plan: Complete DASH. myofascial release, manual stretching, P/ROM, A/ROM, pinch and grip strengthening, coordination, pain mangement techniques. Modalities PRN.    Consulted and Agree with Plan of Care  Patient       Patient will benefit from skilled therapeutic intervention in order to improve the following deficits and impairments:   Body Structure / Function / Physical Skills: ADL, ROM, IADL, Strength, Coordination, FMC, GMC, Pain, UE functional use, Flexibility, Fascial restriction, Decreased knowledge of use of DME       Visit Diagnosis: Pain in left wrist - Plan: Ot plan of care cert/re-cert  Other symptoms and signs involving the musculoskeletal system - Plan: Ot plan of care cert/re-cert  Stiffness of left wrist, not elsewhere classified - Plan: Ot plan of care cert/re-cert  Stiffness of left hand, not elsewhere classified - Plan: Ot plan of care cert/re-cert  Pain in left hand - Plan: Ot plan of care cert/re-cert  Other lack of coordination - Plan: Ot plan of care cert/re-cert    Problem List Patient Active Problem List   Diagnosis Date Noted  . Bronchiectasis with acute exacerbation ([place])   . Obesity, Class III, BMI 40-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) ([place])   . Gastroesophageal reflux disease   . Asthma exacerbation 06/06/2018  . Gait abnormality 08/29/2017  . Urinary incontinence 08/29/2017  . Paresthesia 08/29/2017  . Derangement of posterior horn of medial meniscus of right knee   . Meniscus, lateral, derangement, right   . Arthritis of knee, right   . COPD with acute exacerbation ([place]) 03/09/2016  . DM (diabetes mellitus) ([place]) 02/16/2007  . Cocaine abuse ([place]) 02/16/2007  .  Extrinsic asthma 02/16/2007  . HOMELESSNESS, HX OF 02/16/2007   Ailene Ravel, OTR/L,CBIS  [phone]  08/02/2019, 12:18 PM  [place], [place], [address] Phone: [phone]   Fax:  [phone]  Name: Rhonda Hickman MRN: [id number] Date of Birth: February 24, 1956

## 2019-08-02 NOTE — Patient Instructions (Signed)
Complete the following exercises 2-3 times.  General Finger Flexion/Fist  Close the hand into a fist as far as possible.  Use the thumb of the opposite hand to push the fingers into a tighter grip.  Let pain be your guide stretching as aggressively as you can. Hold for 5 seconds. Complete 10 times.           AROM: Finger Flexion / Extension   Actively bend fingers of right hand. Start with knuckles furthest from palm, and slowly make a fist. Hold _5___ seconds. Relax. Then straighten fingers as far as possible. Repeat _10___ times per set.   Copyright  VHI. All rights reserved.   Paper Crumpling Exercise   Begin with right palm down on piece of paper. Maintaining contact between surface and heel of hand, crumple paper into a ball. Repeat __5-10__ times per set.   Copyright  VHI. All rights reserved.     Towel Roll Squeeze   With right forearm resting on surface, gently squeeze towel. Repeat _10-12___ times per set.   Copyright  VHI. All rights reserved.   Abduction / Adduction (Active)    With hand flat on table, spread all fingers apart, then bring them together as close as possible. Repeat _10___ times.   Copyright  VHI. All rights reserved.  AROM: Thumb Abduction / Adduction   Actively pull right thumb away from palm as far as possible. Hold __5__ seconds. Then bring thumb back to touch fingers. Try not to bend fingers toward thumb. Repeat __5-10__ times per set.   Copyright  VHI. All rights reserved.

## 2019-08-02 NOTE — ED Triage Notes (Signed)
Pt c/o dizziness x 2 weeks, slurred speech and weakness x 3 days. Pt states that she ran out of all her medicine x 3 days ago. Pt states she has been too week to get out of bed, so she laid in bed for 3 days.

## 2019-08-03 ENCOUNTER — Observation Stay (HOSPITAL_COMMUNITY): Payer: 59

## 2019-08-03 ENCOUNTER — Observation Stay (HOSPITAL_BASED_OUTPATIENT_CLINIC_OR_DEPARTMENT_OTHER): Payer: 59

## 2019-08-03 ENCOUNTER — Encounter (HOSPITAL_COMMUNITY): Payer: Self-pay | Admitting: Internal Medicine

## 2019-08-03 DIAGNOSIS — R471 Dysarthria and anarthria: Secondary | ICD-10-CM | POA: Diagnosis not present

## 2019-08-03 DIAGNOSIS — Z7982 Long term (current) use of aspirin: Secondary | ICD-10-CM | POA: Diagnosis not present

## 2019-08-03 DIAGNOSIS — Z833 Family history of diabetes mellitus: Secondary | ICD-10-CM | POA: Diagnosis not present

## 2019-08-03 DIAGNOSIS — F419 Anxiety disorder, unspecified: Secondary | ICD-10-CM | POA: Diagnosis not present

## 2019-08-03 DIAGNOSIS — Z794 Long term (current) use of insulin: Secondary | ICD-10-CM | POA: Diagnosis not present

## 2019-08-03 DIAGNOSIS — I129 Hypertensive chronic kidney disease with stage 1 through stage 4 chronic kidney disease, or unspecified chronic kidney disease: Secondary | ICD-10-CM | POA: Diagnosis not present

## 2019-08-03 DIAGNOSIS — R32 Unspecified urinary incontinence: Secondary | ICD-10-CM

## 2019-08-03 DIAGNOSIS — E1121 Type 2 diabetes mellitus with diabetic nephropathy: Secondary | ICD-10-CM

## 2019-08-03 DIAGNOSIS — Z79899 Other long term (current) drug therapy: Secondary | ICD-10-CM | POA: Diagnosis not present

## 2019-08-03 DIAGNOSIS — G459 Transient cerebral ischemic attack, unspecified: Secondary | ICD-10-CM

## 2019-08-03 DIAGNOSIS — F319 Bipolar disorder, unspecified: Secondary | ICD-10-CM | POA: Diagnosis not present

## 2019-08-03 DIAGNOSIS — D72829 Elevated white blood cell count, unspecified: Secondary | ICD-10-CM | POA: Diagnosis present

## 2019-08-03 DIAGNOSIS — M25562 Pain in left knee: Secondary | ICD-10-CM | POA: Diagnosis not present

## 2019-08-03 DIAGNOSIS — D631 Anemia in chronic kidney disease: Secondary | ICD-10-CM | POA: Diagnosis not present

## 2019-08-03 DIAGNOSIS — R4781 Slurred speech: Secondary | ICD-10-CM

## 2019-08-03 DIAGNOSIS — J449 Chronic obstructive pulmonary disease, unspecified: Secondary | ICD-10-CM | POA: Diagnosis not present

## 2019-08-03 DIAGNOSIS — Z6837 Body mass index (BMI) 37.0-37.9, adult: Secondary | ICD-10-CM | POA: Diagnosis not present

## 2019-08-03 DIAGNOSIS — E1142 Type 2 diabetes mellitus with diabetic polyneuropathy: Secondary | ICD-10-CM | POA: Diagnosis not present

## 2019-08-03 DIAGNOSIS — F17210 Nicotine dependence, cigarettes, uncomplicated: Secondary | ICD-10-CM | POA: Diagnosis not present

## 2019-08-03 DIAGNOSIS — Z20822 Contact with and (suspected) exposure to covid-19: Secondary | ICD-10-CM | POA: Diagnosis not present

## 2019-08-03 DIAGNOSIS — Z79890 Hormone replacement therapy (postmenopausal): Secondary | ICD-10-CM | POA: Diagnosis not present

## 2019-08-03 DIAGNOSIS — R42 Dizziness and giddiness: Secondary | ICD-10-CM | POA: Diagnosis present

## 2019-08-03 DIAGNOSIS — E1122 Type 2 diabetes mellitus with diabetic chronic kidney disease: Secondary | ICD-10-CM | POA: Diagnosis not present

## 2019-08-03 DIAGNOSIS — K219 Gastro-esophageal reflux disease without esophagitis: Secondary | ICD-10-CM | POA: Diagnosis not present

## 2019-08-03 DIAGNOSIS — N1831 Chronic kidney disease, stage 3a: Secondary | ICD-10-CM | POA: Diagnosis not present

## 2019-08-03 DIAGNOSIS — Z7951 Long term (current) use of inhaled steroids: Secondary | ICD-10-CM | POA: Diagnosis not present

## 2019-08-03 DIAGNOSIS — I10 Essential (primary) hypertension: Secondary | ICD-10-CM

## 2019-08-03 DIAGNOSIS — G8929 Other chronic pain: Secondary | ICD-10-CM | POA: Diagnosis not present

## 2019-08-03 LAB — GLUCOSE, CAPILLARY
Glucose-Capillary: 233 mg/dL — ABNORMAL HIGH (ref 70–99)
Glucose-Capillary: 183 mg/dL — ABNORMAL HIGH (ref 70–99)

## 2019-08-03 LAB — ECHOCARDIOGRAM COMPLETE
Height: 75 in
Weight: 4783.1 oz

## 2019-08-03 LAB — HEMOGLOBIN A1C
Hgb A1c MFr Bld: 8.5 % — ABNORMAL HIGH (ref 4.8–5.6)
Mean Plasma Glucose: 197.25 mg/dL

## 2019-08-03 LAB — VITAMIN B12: Vitamin B-12: 204 pg/mL (ref 180–914)

## 2019-08-03 LAB — TSH: TSH: 2.395 u[IU]/mL (ref 0.350–4.500)

## 2019-08-03 LAB — RESPIRATORY PANEL BY RT PCR (FLU A&B, COVID)
Influenza A by PCR: NEGATIVE
Influenza B by PCR: NEGATIVE
SARS Coronavirus 2 by RT PCR: NEGATIVE

## 2019-08-03 LAB — CBG MONITORING, ED
Glucose-Capillary: 188 mg/dL — ABNORMAL HIGH (ref 70–99)
Glucose-Capillary: 204 mg/dL — ABNORMAL HIGH (ref 70–99)

## 2019-08-03 LAB — POC SARS CORONAVIRUS 2 AG -  ED: SARS Coronavirus 2 Ag: NEGATIVE

## 2019-08-03 LAB — MRSA PCR SCREENING: MRSA by PCR: NEGATIVE

## 2019-08-03 MED ORDER — MECLIZINE HCL 25 MG PO TABS: 25.0000 mg | ORAL_TABLET | Freq: Three times a day (TID) | ORAL | Status: DC | PRN

## 2019-08-03 MED ORDER — STROKE: EARLY STAGES OF RECOVERY BOOK
Freq: Once | Status: DC
  Filled 2019-08-03: qty 1

## 2019-08-03 MED ORDER — ACETAMINOPHEN 325 MG PO TABS
650.0000 mg | ORAL_TABLET | Freq: Four times a day (QID) | ORAL | Status: DC | PRN
  Administered 2019-08-03 – 2019-08-04 (×2): 650 mg via ORAL
  Filled 2019-08-03 (×2): qty 2

## 2019-08-03 MED ORDER — ASPIRIN EC 325 MG PO TBEC
325.0000 mg | DELAYED_RELEASE_TABLET | Freq: Every day | ORAL | Status: DC
  Administered 2019-08-03 – 2019-08-05 (×3): 325 mg via ORAL
  Filled 2019-08-03 (×3): qty 1

## 2019-08-03 MED ORDER — OXYBUTYNIN CHLORIDE 5 MG PO TABS
5.0000 mg | ORAL_TABLET | Freq: Two times a day (BID) | ORAL | Status: DC
  Administered 2019-08-03 – 2019-08-05 (×5): 5 mg via ORAL
  Filled 2019-08-03 (×10): qty 1

## 2019-08-03 MED ORDER — SODIUM CHLORIDE 0.9 % IV SOLN: INTRAVENOUS | Status: DC

## 2019-08-03 MED ORDER — TIOTROPIUM BROMIDE MONOHYDRATE 18 MCG IN CAPS: 18.0000 ug | ORAL_CAPSULE | Freq: Every day | RESPIRATORY_TRACT | Status: DC

## 2019-08-03 MED ORDER — ONDANSETRON HCL 4 MG PO TABS: 4.0000 mg | ORAL_TABLET | Freq: Four times a day (QID) | ORAL | Status: DC | PRN

## 2019-08-03 MED ORDER — DIVALPROEX SODIUM 500 MG PO DR TAB
1000.0000 mg | DELAYED_RELEASE_TABLET | Freq: Every day | ORAL | Status: DC
  Administered 2019-08-03 – 2019-08-04 (×2): 1000 mg via ORAL
  Filled 2019-08-03 (×2): qty 2

## 2019-08-03 MED ORDER — LISINOPRIL 5 MG PO TABS
5.0000 mg | ORAL_TABLET | Freq: Every day | ORAL | Status: DC
  Administered 2019-08-03 – 2019-08-05 (×3): 5 mg via ORAL
  Filled 2019-08-03 (×3): qty 1

## 2019-08-03 MED ORDER — HEPARIN SODIUM (PORCINE) 5000 UNIT/ML IJ SOLN
5000.0000 [IU] | Freq: Three times a day (TID) | INTRAMUSCULAR | Status: DC
  Administered 2019-08-03 – 2019-08-05 (×8): 5000 [IU] via SUBCUTANEOUS
  Filled 2019-08-03 (×8): qty 1

## 2019-08-03 MED ORDER — ONDANSETRON HCL 4 MG/2ML IJ SOLN: 4.0000 mg | Freq: Four times a day (QID) | INTRAMUSCULAR | Status: DC | PRN

## 2019-08-03 MED ORDER — ALBUTEROL SULFATE HFA 108 (90 BASE) MCG/ACT IN AERS
2.0000 | INHALATION_SPRAY | RESPIRATORY_TRACT | Status: DC | PRN
  Filled 2019-08-03: qty 6.7

## 2019-08-03 MED ORDER — CITALOPRAM HYDROBROMIDE 20 MG PO TABS
20.0000 mg | ORAL_TABLET | Freq: Every day | ORAL | Status: DC
  Administered 2019-08-04 – 2019-08-05 (×2): 20 mg via ORAL
  Filled 2019-08-03 (×5): qty 1

## 2019-08-03 MED ORDER — DIVALPROEX SODIUM 250 MG PO DR TAB
500.0000 mg | DELAYED_RELEASE_TABLET | Freq: Every morning | ORAL | Status: DC
  Administered 2019-08-03 – 2019-08-05 (×3): 500 mg via ORAL
  Filled 2019-08-03 (×3): qty 2

## 2019-08-03 MED ORDER — FLUTICASONE FUROATE-VILANTEROL 200-25 MCG/INH IN AEPB
INHALATION_SPRAY | RESPIRATORY_TRACT | Status: AC
  Administered 2019-08-03: 1 via RESPIRATORY_TRACT
  Filled 2019-08-03: qty 28

## 2019-08-03 MED ORDER — UMECLIDINIUM BROMIDE 62.5 MCG/INH IN AEPB
INHALATION_SPRAY | RESPIRATORY_TRACT | Status: AC
  Administered 2019-08-03: 1 via RESPIRATORY_TRACT
  Filled 2019-08-03: qty 7

## 2019-08-03 MED ORDER — ACETAMINOPHEN 650 MG RE SUPP: 650.0000 mg | Freq: Four times a day (QID) | RECTAL | Status: DC | PRN

## 2019-08-03 MED ORDER — INSULIN ASPART 100 UNIT/ML ~~LOC~~ SOLN
0.0000 [IU] | Freq: Three times a day (TID) | SUBCUTANEOUS | Status: DC
  Filled 2019-08-03: qty 1

## 2019-08-03 MED ORDER — FAMOTIDINE 20 MG PO TABS
20.0000 mg | ORAL_TABLET | Freq: Two times a day (BID) | ORAL | Status: DC
  Administered 2019-08-03 – 2019-08-05 (×5): 20 mg via ORAL
  Filled 2019-08-03 (×5): qty 1

## 2019-08-03 MED ORDER — INSULIN ASPART 100 UNIT/ML ~~LOC~~ SOLN
0.0000 [IU] | Freq: Three times a day (TID) | SUBCUTANEOUS | Status: DC
  Administered 2019-08-03: 5 [IU] via SUBCUTANEOUS
  Administered 2019-08-03: 3 [IU] via SUBCUTANEOUS
  Administered 2019-08-03: 5 [IU] via SUBCUTANEOUS
  Administered 2019-08-04: 3 [IU] via SUBCUTANEOUS
  Administered 2019-08-04 – 2019-08-05 (×3): 5 [IU] via SUBCUTANEOUS
  Administered 2019-08-05: 3 [IU] via SUBCUTANEOUS
  Filled 2019-08-03: qty 1

## 2019-08-03 MED ORDER — INSULIN DETEMIR 100 UNIT/ML ~~LOC~~ SOLN
12.0000 [IU] | Freq: Two times a day (BID) | SUBCUTANEOUS | Status: DC
  Administered 2019-08-03 – 2019-08-05 (×5): 12 [IU] via SUBCUTANEOUS
  Filled 2019-08-03 (×10): qty 0.12

## 2019-08-03 MED ORDER — GABAPENTIN 300 MG PO CAPS
900.0000 mg | ORAL_CAPSULE | Freq: Three times a day (TID) | ORAL | Status: DC
  Administered 2019-08-03 – 2019-08-05 (×7): 900 mg via ORAL
  Filled 2019-08-03 (×8): qty 3

## 2019-08-03 MED ORDER — PANTOPRAZOLE SODIUM 40 MG PO TBEC
40.0000 mg | DELAYED_RELEASE_TABLET | Freq: Every day | ORAL | Status: DC
  Administered 2019-08-04 – 2019-08-05 (×2): 40 mg via ORAL
  Filled 2019-08-03 (×2): qty 1

## 2019-08-03 MED ORDER — FLUTICASONE FUROATE-VILANTEROL 200-25 MCG/INH IN AEPB
1.0000 | INHALATION_SPRAY | Freq: Every day | RESPIRATORY_TRACT | Status: DC
  Administered 2019-08-04 – 2019-08-05 (×2): 1 via RESPIRATORY_TRACT
  Filled 2019-08-03: qty 28

## 2019-08-03 MED ORDER — UMECLIDINIUM BROMIDE 62.5 MCG/INH IN AEPB
1.0000 | INHALATION_SPRAY | Freq: Every day | RESPIRATORY_TRACT | Status: DC
  Administered 2019-08-04 – 2019-08-05 (×2): 1 via RESPIRATORY_TRACT
  Filled 2019-08-03: qty 7

## 2019-08-03 MED ORDER — DULOXETINE HCL 30 MG PO CPEP
30.0000 mg | ORAL_CAPSULE | Freq: Every day | ORAL | Status: DC
  Administered 2019-08-04 – 2019-08-05 (×2): 30 mg via ORAL
  Filled 2019-08-03 (×3): qty 1

## 2019-08-03 NOTE — ED Notes (Signed)
Pt found standing @ doorway of rm w/unsteady gait, pt refused to sit back on bed & let us get a bsc, pt wanted to go to private br. Pt assisted to bathroom in wheelchair, unsteady gait.

## 2019-08-03 NOTE — H&P (Signed)
History and Physical    Rhonda Hickman [id number] DOB: 05-Jul-1956 DOA: 08/02/2019  PCP: [name] [place]   Patient coming from: Home.  I have personally briefly reviewed patient's old medical records in [place]  Chief Complaint: Weakness and slurred speech x3 days.  HPI: Rhonda Hickman is a 64 y.o. female with medical history significant of anxiety, asthma, bipolar 1 disorder, bulging lumbar disc, chronic pain to left knee, COPD, type 2 diabetes, diabetic peripheral neuropathy who is coming to the emergency department with complaints of being dizzy for the past 2 weeks.  She also mentions that for the past 3 days her dizziness has become worse and is now associated with slurred speech and generalized weakness.  She denies headache, diplopia or blurred vision.  She has been having trouble ambulating, but denies having any falls.  No fever, chills, sore throat or rhinorrhea.  Denies dyspnea, chest pain, palpitations, diaphoresis, orthopnea or recent lower extremity edema.  No abdominal pain, nausea, vomiting, diarrhea, constipation, melena or hematochezia.  No dysuria, frequency or hematuria.  Denies polyuria, polydipsia, polyphagia or blurred vision.  Patient stated that she has not have any of her medications in the past 3 days.  She has been laying in bed since then and has been feeling too weak to go to the pharmacy.  ED Course: Initial vital signs temperature 99 F, pulse 85, respirations 16, blood pressure 139/76 mmHg and O2 sat 95% on room air.  White count is 14.5, hemoglobin 11.8 g/dL and platelets 277.  BMP shows sodium 131, potassium 3.5, chloride 96 and CO2 24 mmol/L.  Her glucose 301, BUN 16, creatinine 1.36 and calcium 8.4 mg/dL.  EKG was sinus rhythm.  CT head did not show any acute on normalities.  Review of Systems: As per HPI otherwise 10 point review of systems negative.   Past Medical History:  Diagnosis Date  . Anxiety   . Asthma   . Bipolar 1  disorder ([place])   . Bulging lumbar disc   . Chronic pain of left knee   . COPD (chronic obstructive pulmonary disease) ([place])   . Diabetes mellitus   . Neuropathy     Past Surgical History:  Procedure Laterality Date  . ABDOMINAL HYSTERECTOMY    . CARPAL TUNNEL RELEASE Right   . KNEE ARTHROSCOPY WITH MEDIAL MENISECTOMY Right 04/14/2016   Procedure: KNEE ARTHROSCOPY WITH MEDIAL AND LATERAL MENISECTOMY, MICROFRACTURE REPAIR;  Surgeon: Carole Civil, MD;  Location: [facility];  Service: Orthopedics;  Laterality: Right;  lateral menisectomy - needs crutch training     reports that she has been smoking cigarettes. She has been smoking about 0.25 packs per day. She has never used smokeless tobacco. She reports that she does not drink alcohol or use drugs.  Allergies  Allergen Reactions  . Aspirin Nausea Only and Other (See Comments)    Causes stomach pain  . Ibuprofen Other (See Comments)    Stomach problems    Family History  Problem Relation Age of Onset  . Diabetes Mother   . Heart attack Father    Prior to Admission medications   Medication Sig Start Date End Date Taking? Authorizing Provider  albuterol (PROVENTIL HFA;VENTOLIN HFA) 108 (90 Base) MCG/ACT inhaler Inhale 2 puffs into the lungs every 4 (four) hours as needed. 08/20/17   Rolland Porter, MD  albuterol (PROVENTIL) (2.5 MG/3ML) 0.083% nebulizer solution Take 2.5 mg by nebulization every 6 (six) hours as needed for shortness of breath.  [provider]  amLODipine (NORVASC) 5 MG tablet Take 5 mg by mouth daily.    [provider]  benzonatate (TESSALON) 100 MG capsule Take 1 capsule (100 mg total) by mouth 3 (three) times daily as needed (uncontrolled coughing spells). 06/07/18   Barton Dubois, MD  citalopram (CELEXA) 20 MG tablet Take 20 mg by mouth daily.    [provider]  dextromethorphan-guaiFENesin (MUCINEX DM) 30-600 MG 12hr tablet Take 1 tablet by mouth 2 (two) times daily. 06/07/18    Barton Dubois, MD  diclofenac (VOLTAREN) 75 MG EC tablet TAKE  (1)  TABLET TWICE A DAY. 07/10/19   Carole Civil, MD  divalproex (DEPAKOTE ER) 500 MG 24 hr tablet Take 500-1,000 mg by mouth See admin instructions. 500MG  in the morning and 1000mg  at bedtime 03/26/18   [provider]  DULoxetine (CYMBALTA) 30 MG capsule Take 1 capsule (30 mg total) by mouth daily. 09/12/18   Suzzanne Cloud, NP  Fluticasone-Salmeterol (ADVAIR DISKUS) 250-50 MCG/DOSE AEPB Inhale 1 puff into the lungs 2 (two) times daily. 06/07/18 06/07/19  Barton Dubois, MD  gabapentin (NEURONTIN) 300 MG capsule Take 3 capsules (900 mg total) by mouth 3 (three) times daily. 10/26/18   Sater, Nanine Means, MD  HYDROcodone-acetaminophen (NORCO/VICODIN) 5-325 MG tablet Take one tab po q 4 hrs prn pain 07/01/19   Triplett, Tammy, PA-C  insulin NPH-regular (NOVOLIN 70/30) (70-30) 100 UNIT/ML injection Inject 50 Units into the skin 3 (three) times daily.     [provider]  lisinopril (PRINIVIL,ZESTRIL) 5 MG tablet Take 5 mg by mouth daily.    [provider]  oxybutynin (DITROPAN) 5 MG tablet Take 5 mg by mouth 2 (two) times daily.    [provider]  pantoprazole (PROTONIX) 40 MG tablet Take 1 tablet (40 mg total) by mouth daily. 06/07/18 06/07/19  Barton Dubois, MD  tiotropium (SPIRIVA HANDIHALER) 18 MCG inhalation capsule Place 1 capsule (18 mcg total) into inhaler and inhale daily. 06/07/18 06/07/19  Barton Dubois, MD  traMADol (ULTRAM) 50 MG tablet TAKE (1) TABLET EVERY SIX HOURS AS NEEDED FOR SEVERE PAIN.    - MAY MAKE DROWSY - 07/10/19   Carole Civil, MD  traZODone (DESYREL) 100 MG tablet Take 200 mg by mouth at bedtime as needed for sleep.     [provider]    Physical Exam: Vitals:   08/02/19 2251 08/02/19 2253 08/03/19 0539  BP: 139/76  130/76  Pulse: 85  76  Resp: 16  19  Temp: 99 F (37.2 C)    SpO2: 95%  97%  Weight:  135.6 kg   Height:  6\' 3"  (1.905 m)      Constitutional: NAD, calm, comfortable Eyes: PERRL, lids and conjunctivae normal ENMT: Mucous membranes are moist. Posterior pharynx clear of any exudate or lesions. Neck: normal, supple, no masses, no thyromegaly Respiratory: Decreased breath sounds in bases, otherwise clear to auscultation bilaterally, no wheezing, no crackles. Normal respiratory effort. No accessory muscle use.  Cardiovascular: Regular rate and rhythm, no murmurs / rubs / gallops. No extremity edema. 2+ pedal pulses. No carotid bruits.  Abdomen: Obese, soft, no tenderness, no masses palpated. No hepatosplenomegaly. Bowel sounds positive.  Musculoskeletal: no clubbing / cyanosis. Good ROM, no contractures. Normal muscle tone.  Skin: no rashes, lesions, ulcers on limited dermatological examination. Neurologic: CN 2-12 grossly intact. Subjective decrease sensation on left, DTR normal.  4/5 left-sided weakness. Psychiatric: Normal judgment and insight. Alert and oriented x 3. Normal  mood.   Labs on Admission: I have personally reviewed following labs and imaging studies  CBC: Recent Labs  Lab 08/02/19 2335  WBC 14.5*  NEUTROABS 7.6  HGB 11.8*  HCT 37.0  MCV 87.5  PLT 99991111   Basic Metabolic Panel: Recent Labs  Lab 08/02/19 2335  NA 131*  K 3.5  CL 96*  CO2 24  GLUCOSE 301*  BUN 16  CREATININE 1.36*  CALCIUM 8.4*   GFR: Estimated Creatinine Clearance: 69.2 mL/min (A) (by C-G formula based on SCr of 1.36 mg/dL (H)). Liver Function Tests: No results for input(s): AST, ALT, ALKPHOS, BILITOT, PROT, ALBUMIN in the last 168 hours. No results for input(s): LIPASE, AMYLASE in the last 168 hours. No results for input(s): AMMONIA in the last 168 hours. Coagulation Profile: No results for input(s): INR, PROTIME in the last 168 hours. Cardiac Enzymes: No results for input(s): CKTOTAL, CKMB, CKMBINDEX, TROPONINI in the last 168 hours. BNP (last 3 results) No results for input(s): PROBNP in the last 8760  hours. HbA1C: No results for input(s): HGBA1C in the last 72 hours. CBG: No results for input(s): GLUCAP in the last 168 hours. Lipid Profile: No results for input(s): CHOL, HDL, LDLCALC, TRIG, CHOLHDL, LDLDIRECT in the last 72 hours. Thyroid Function Tests: No results for input(s): TSH, T4TOTAL, FREET4, T3FREE, THYROIDAB in the last 72 hours. Anemia Panel: No results for input(s): VITAMINB12, FOLATE, FERRITIN, TIBC, IRON, RETICCTPCT in the last 72 hours. Urine analysis:  Radiological Exams on Admission: CT HEAD WO CONTRAST  Result Date: 08/03/2019 CLINICAL DATA:  Vertigo. Dizziness and slurred speech. Weakness. EXAM: CT HEAD WITHOUT CONTRAST TECHNIQUE: Contiguous axial images were obtained from the base of the skull through the vertex without intravenous contrast. COMPARISON:  Head CT 01/17/2018 FINDINGS: Brain: No evidence of acute infarction, hemorrhage, hydrocephalus, extra-axial collection or mass lesion/mass effect. Brain volume is normal for age. Minimal chronic small vessel ischemia. Vascular: Atherosclerosis of skullbase vasculature without hyperdense vessel or abnormal calcification. Skull: No fracture or focal lesion. Sinuses/Orbits: Paranasal sinuses and mastoid air cells are clear. The visualized orbits are unremarkable. Other: None. IMPRESSION: No acute intracranial abnormality. Mild chronic small vessel ischemia, stable from prior. Electronically Signed   By: Keith Rake M.D.   On: 08/03/2019 01:08    EKG: Independently reviewed. Vent. rate 83 BPM PR interval * ms QRS duration 73 ms QT/QTc 306/360 ms P-R-T axes 55 40 11 Sinus rhythm Baseline wander in lead(s) V2  Assessment/Plan Principal Problem:   Vertigo   Slurred speech Observation/telemetry. Frequent neuro checks. PT/OT/SLP. Fasting lipids and hemoglobin A1c. Check carotid Doppler and echocardiogram. Check MRI of brain.  Active Problems:   Type 2 diabetes mellitus (HCC) Carbohydrate modified  diet. Check hemoglobin A1c. CBG monitoring with RI SS    Urinary incontinence Resume oxybutynin once med rec performed    Gastroesophageal reflux disease Resume PPI.    Leukocytosis No fever or any other significant symptomatology. Monitor white blood cell count. Follow-up urinalysis.   DVT prophylaxis: SCDs. Code Status: Full code. Family Communication:  Disposition Plan: Transfer to [facility] for TIA/vertigo work-up. Consults called: Admission status: Telemetry/observation.   Reubin Milan MD [facility]  If 7PM-7AM, please contact night-coverage [email or web address]  08/03/2019, 6:25 AM   This document was prepared using Dragon voice recognition software and may contain some unintended transcription errors.

## 2019-08-03 NOTE — Progress Notes (Signed)
PROGRESS NOTE    Karri Zola  [id number] DOB: 1955-12-01 DOA: 08/02/2019 PCP: [facility] [place]     Brief Narrative:  As per H&P written by Dr. Olevia Bowens on 08/02/2019 64 y.o. female with medical history significant of anxiety, asthma, bipolar 1 disorder, bulging lumbar disc, chronic pain to left knee, COPD, type 2 diabetes, diabetic peripheral neuropathy who is coming to the emergency department with complaints of being dizzy for the past 2 weeks.  She also mentions that for the past 3 days her dizziness has become worse and is now associated with slurred speech and generalized weakness.  She denies headache, diplopia or blurred vision.  She has been having trouble ambulating, but denies having any falls.  No fever, chills, sore throat or rhinorrhea.  Denies dyspnea, chest pain, palpitations, diaphoresis, orthopnea or recent lower extremity edema.  No abdominal pain, nausea, vomiting, diarrhea, constipation, melena or hematochezia.  No dysuria, frequency or hematuria.  Denies polyuria, polydipsia, polyphagia or blurred vision.  Patient stated that she has not have any of her medications in the past 3 days.  She has been laying in bed since then and has been feeling too weak to go to the pharmacy.  ED Course: Initial vital signs temperature 99 F, pulse 85, respirations 16, blood pressure 139/76 mmHg and O2 sat 95% on room air.  White count is 14.5, hemoglobin 11.8 g/dL and platelets 277.  BMP shows sodium 131, potassium 3.5, chloride 96 and CO2 24 mmol/L.  Her glucose 301, BUN 16, creatinine 1.36 and calcium 8.4 mg/dL.  EKG was sinus rhythm.  CT head did not show any acute on normalities.  Assessment & Plan: 1-vertigo/weakness and slurred speech. -CT head negative for acute abnormalities -TIA/stroke work-up ongoing. -No MRI at available at [facility], patient will be transferred to [facility] to fulfill proper studies. -Neurology service to be consulted once patient  arrived there. -Follow 2D echo and carotid Dopplers -Continue aspirin for secondary prevention at this moment. -Allow for permissive hypertension and continue risk factor modifications. -Patient has passed swallowing evaluation and her diet will be advanced accordingly.  2-Type 2 diabetes mellitus with nephropathy ([place]) -Chronically on insulin therapy -Sliding scale insulin and Levemir initiated -Follow CBGs and A1c.  3-Urinary incontinence -No dysuria reported -Continue Ditropan.  4-chronic kidney disease a stage IIIa -Appears to be at baseline -Maintain adequate hydration -Follow renal function and electrolytes trend -Minimize/avoid nephrotoxic agents as much as possible.  5-Gastroesophageal reflux disease -Continue PPI  6-leukocytosis -Appears to be in the setting of stress demargination -No signs or symptoms to suggest acute infection. -Gentle fluid resuscitation to be given overnight we will repeat CBC in the morning to follow WBCs trend.  7-depression/anxiety -Continue Celexa and Depakote -No suicidal ideation or hallucinations currently.  8-history of COPD -No wheezing -Good O2 sat on room air -Continue equivalent (Breo Ellipta inside the hospital) -Continue as needed bronchodilators and Spiriva.  9-class II obesity -Body mass index is 37.37 kg/m. -Low calorie diet, portion control and increase physical activity discussed with patient.  10-essential hypertension -Blood pressure stable -Continue low-dose lisinopril. -Will monitor vital signs further adjust antihypertensive regimen as needed. -Allow for permissive hypertension in the setting of presumed ischemic episode.   DVT prophylaxis: Heparin. Code Status: Full code Family Communication: No family at bedside Disposition Plan: Hopefully discharge home once TIA/stroke work-up completed.  Patient will be transferred to [facility] to facilitate MRI evaluation and if needed neurology service  involvement for further inputs.  Consultants:   Neurology service at [facility] to be involved once patient arrived to that campus.  Procedures:   2D echo: Pending  -Carotid Dopplers: Pending  MRI: Pending  -See below for x-ray reports.  Antimicrobials:  Anti-infectives (From admission, onward)   None       Subjective: Reports still feeling dizzy, generally weak and with complete drain energy level.  No nausea, no vomiting, no chest pain, no fever, no dysuria.  Objective: Vitals:   08/02/19 2251 08/02/19 2253 08/03/19 0539 08/03/19 0630  BP: 139/76  130/76 127/75  Pulse: 85  76 74  Resp: 16  19 20   Temp: 99 F (37.2 C)     SpO2: 95%  97% 95%  Weight:  135.6 kg    Height:  6\' 3"  (1.905 m)     No intake or output data in the 24 hours ending 08/03/19 0734 Filed Weights   08/02/19 2253  Weight: 135.6 kg    Examination: General exam: Alert, awake, oriented x 3, mild dysarthria appreciated; feeling generally weak.  No chest pain, no nausea, no vomiting.  Has passed swallowing evaluation and demonstrate no pronation drift. Respiratory system: Clear to auscultation. Respiratory effort normal. Cardiovascular system:RRR. No murmurs, rubs, gallops.  No JVD on exam. Gastrointestinal system: Abdomen is obese, nondistended, soft and nontender. No organomegaly or masses felt. Normal bowel sounds heard. Central nervous system: Alert and oriented. No focal motor deficits or abnormal cranial nerve evaluation. Extremities: No cyanosis or clubbing; no edema appreciated on exam. Skin: No rashes, lesions or ulcers Psychiatry: Mood & affect appropriate.     Data Reviewed: I have personally reviewed following labs and imaging studies  CBC: Recent Labs  Lab 08/02/19 2335  WBC 14.5*  NEUTROABS 7.6  HGB 11.8*  HCT 37.0  MCV 87.5  PLT [id number]   Basic Metabolic Panel: Recent Labs  Lab 08/02/19 2335  NA 131*  K 3.5  CL 96*  CO2 24  GLUCOSE 301*  BUN 16  CREATININE 1.36*    CALCIUM 8.4*   GFR: Estimated Creatinine Clearance: 69.2 mL/min (A) (by C-G formula based on SCr of 1.36 mg/dL (H)).  Urine analysis:    Component Value Date/Time   COLORURINE YELLOW 09/12/2013 1642   APPEARANCEUR CLEAR 09/12/2013 1642   LABSPEC >1.030 (H) 09/12/2013 1642   PHURINE 5.0 09/12/2013 1642   GLUCOSEU >1000 (A) 09/12/2013 1642   HGBUR NEGATIVE 09/12/2013 1642   BILIRUBINUR NEGATIVE 09/12/2013 1642   KETONESUR NEGATIVE 09/12/2013 1642   PROTEINUR NEGATIVE 09/12/2013 1642   UROBILINOGEN 0.2 09/12/2013 1642   NITRITE NEGATIVE 09/12/2013 1642   LEUKOCYTESUR NEGATIVE 09/12/2013 1642   Radiology Studies: CT HEAD WO CONTRAST  Result Date: 08/03/2019 CLINICAL DATA:  Vertigo. Dizziness and slurred speech. Weakness. EXAM: CT HEAD WITHOUT CONTRAST TECHNIQUE: Contiguous axial images were obtained from the base of the skull through the vertex without intravenous contrast. COMPARISON:  Head CT 01/17/2018 FINDINGS: Brain: No evidence of acute infarction, hemorrhage, hydrocephalus, extra-axial collection or mass lesion/mass effect. Brain volume is normal for age. Minimal chronic small vessel ischemia. Vascular: Atherosclerosis of skullbase vasculature without hyperdense vessel or abnormal calcification. Skull: No fracture or focal lesion. Sinuses/Orbits: Paranasal sinuses and mastoid air cells are clear. The visualized orbits are unremarkable. Other: None. IMPRESSION: No acute intracranial abnormality. Mild chronic small vessel ischemia, stable from prior. Electronically Signed   By: Keith Rake M.D.   On: 08/03/2019 01:08    Scheduled Meds: .  stroke: mapping our early stages of recovery  book   Does not apply Once  . aspirin EC  325 mg Oral Daily  . citalopram  20 mg Oral Daily  . divalproex  500-1,000 mg Oral See admin instructions  . DULoxetine  30 mg Oral Daily  . famotidine  20 mg Oral BID  . fluticasone furoate-vilanterol  1 puff Inhalation Daily  . gabapentin  900 mg Oral  TID  . insulin aspart  0-20 Units Subcutaneous TID WC  . lisinopril  5 mg Oral Daily  . oxybutynin  5 mg Oral BID  . tiotropium  18 mcg Inhalation Daily   Continuous Infusions: . sodium chloride       LOS: 0 days    Time spent: 30 minutes    Barton Dubois, MD [facility] Pager [phone]   08/03/2019, 7:34 AM

## 2019-08-03 NOTE — ED Notes (Signed)
Report given to Paul with [facility]  ?

## 2019-08-03 NOTE — Progress Notes (Signed)
*  PRELIMINARY RESULTS* Echocardiogram 2D Echocardiogram has been performed.  Rhonda Hickman 08/03/2019, 1:37 PM

## 2019-08-04 DIAGNOSIS — Z20822 Contact with and (suspected) exposure to covid-19: Secondary | ICD-10-CM | POA: Diagnosis present

## 2019-08-04 DIAGNOSIS — F17210 Nicotine dependence, cigarettes, uncomplicated: Secondary | ICD-10-CM | POA: Diagnosis present

## 2019-08-04 DIAGNOSIS — Z6837 Body mass index (BMI) 37.0-37.9, adult: Secondary | ICD-10-CM | POA: Diagnosis not present

## 2019-08-04 DIAGNOSIS — E1122 Type 2 diabetes mellitus with diabetic chronic kidney disease: Secondary | ICD-10-CM | POA: Diagnosis present

## 2019-08-04 DIAGNOSIS — Z7982 Long term (current) use of aspirin: Secondary | ICD-10-CM | POA: Diagnosis not present

## 2019-08-04 DIAGNOSIS — K219 Gastro-esophageal reflux disease without esophagitis: Secondary | ICD-10-CM | POA: Diagnosis present

## 2019-08-04 DIAGNOSIS — R471 Dysarthria and anarthria: Secondary | ICD-10-CM | POA: Diagnosis present

## 2019-08-04 DIAGNOSIS — R42 Dizziness and giddiness: Secondary | ICD-10-CM

## 2019-08-04 DIAGNOSIS — F319 Bipolar disorder, unspecified: Secondary | ICD-10-CM | POA: Diagnosis present

## 2019-08-04 DIAGNOSIS — I129 Hypertensive chronic kidney disease with stage 1 through stage 4 chronic kidney disease, or unspecified chronic kidney disease: Secondary | ICD-10-CM | POA: Diagnosis present

## 2019-08-04 DIAGNOSIS — R32 Unspecified urinary incontinence: Secondary | ICD-10-CM | POA: Diagnosis present

## 2019-08-04 DIAGNOSIS — Z7951 Long term (current) use of inhaled steroids: Secondary | ICD-10-CM | POA: Diagnosis not present

## 2019-08-04 DIAGNOSIS — Z833 Family history of diabetes mellitus: Secondary | ICD-10-CM | POA: Diagnosis not present

## 2019-08-04 DIAGNOSIS — M25562 Pain in left knee: Secondary | ICD-10-CM | POA: Diagnosis present

## 2019-08-04 DIAGNOSIS — G8929 Other chronic pain: Secondary | ICD-10-CM | POA: Diagnosis present

## 2019-08-04 DIAGNOSIS — Z794 Long term (current) use of insulin: Secondary | ICD-10-CM | POA: Diagnosis not present

## 2019-08-04 DIAGNOSIS — D631 Anemia in chronic kidney disease: Secondary | ICD-10-CM | POA: Diagnosis present

## 2019-08-04 DIAGNOSIS — Z79890 Hormone replacement therapy (postmenopausal): Secondary | ICD-10-CM | POA: Diagnosis not present

## 2019-08-04 DIAGNOSIS — J449 Chronic obstructive pulmonary disease, unspecified: Secondary | ICD-10-CM | POA: Diagnosis present

## 2019-08-04 DIAGNOSIS — N1831 Chronic kidney disease, stage 3a: Secondary | ICD-10-CM | POA: Diagnosis present

## 2019-08-04 DIAGNOSIS — Z79899 Other long term (current) drug therapy: Secondary | ICD-10-CM | POA: Diagnosis not present

## 2019-08-04 DIAGNOSIS — E1142 Type 2 diabetes mellitus with diabetic polyneuropathy: Secondary | ICD-10-CM | POA: Diagnosis present

## 2019-08-04 DIAGNOSIS — F419 Anxiety disorder, unspecified: Secondary | ICD-10-CM | POA: Diagnosis present

## 2019-08-04 LAB — BASIC METABOLIC PANEL
Anion gap: 10 (ref 5–15)
BUN: 13 mg/dL (ref 8–23)
CO2: 24 mmol/L (ref 22–32)
Calcium: 8.6 mg/dL — ABNORMAL LOW (ref 8.9–10.3)
Chloride: 105 mmol/L (ref 98–111)
Creatinine, Ser: 1.02 mg/dL — ABNORMAL HIGH (ref 0.44–1.00)
GFR calc Af Amer: >60 mL/min (ref >60)
GFR calc non Af Amer: 59 mL/min — ABNORMAL LOW (ref >60)
Glucose, Bld: 197 mg/dL — ABNORMAL HIGH (ref 70–99)
Potassium: 4.4 mmol/L (ref 3.5–5.1)
Sodium: 139 mmol/L (ref 135–145)

## 2019-08-04 LAB — HIV ANTIBODY (ROUTINE TESTING W REFLEX): HIV Screen 4th Generation wRfx: NONREACTIVE

## 2019-08-04 LAB — CBC
HCT: 36.1 % (ref 36.0–46.0)
Hemoglobin: 11.7 g/dL — ABNORMAL LOW (ref 12.0–15.0)
MCH: 28.3 pg (ref 26.0–34.0)
MCHC: 32.4 g/dL (ref 30.0–36.0)
MCV: 87.2 fL (ref 80.0–100.0)
Platelets: 271 10*3/uL (ref 150–400)
RBC: 4.14 MIL/uL (ref 3.87–5.11)
RDW: 14.4 % (ref 11.5–15.5)
WBC: 7.4 10*3/uL (ref 4.0–10.5)
nRBC: 0 % (ref 0.0–0.2)

## 2019-08-04 LAB — GLUCOSE, CAPILLARY
Glucose-Capillary: 212 mg/dL — ABNORMAL HIGH (ref 70–99)
Glucose-Capillary: 179 mg/dL — ABNORMAL HIGH (ref 70–99)
Glucose-Capillary: 249 mg/dL — ABNORMAL HIGH (ref 70–99)
Glucose-Capillary: 233 mg/dL — ABNORMAL HIGH (ref 70–99)

## 2019-08-04 LAB — HEMOGLOBIN A1C
Hgb A1c MFr Bld: 8.7 % — ABNORMAL HIGH (ref 4.8–5.6)
Mean Plasma Glucose: 202.99 mg/dL

## 2019-08-04 LAB — LIPID PANEL
Cholesterol: 146 mg/dL (ref 0–200)
HDL: 13 mg/dL — ABNORMAL LOW (ref >40)
LDL Cholesterol: 101 mg/dL — ABNORMAL HIGH (ref 0–99)
Total CHOL/HDL Ratio: 11.2 RATIO
Triglycerides: 161 mg/dL — ABNORMAL HIGH (ref <150)
VLDL: 32 mg/dL (ref 0–40)

## 2019-08-04 NOTE — Evaluation (Signed)
Physical Therapy Evaluation Patient Details Name: Rhonda Hickman MRN: [id number] DOB: 03-30-1956 Today's Date: 08/04/2019   History of Present Illness  64 y.o. female with medical history significant of anxiety, asthma, bipolar 1 disorder, bulging lumbar disc, chronic pain to left knee, COPD, type 2 diabetes, diabetic peripheral neuropathy admitted with complaints of being dizzy for the past 2 weeks.  MRI negative for stroke.  Clinical Impression  Patient presents with mobility limited due to weakness, decreased balance, ataxic gait, decreased safety and decreased activity tolerance.  She walked with min to mod A in the hallway with RW and demonstrated high fall risk behaviors.  Feel she cannot safely return home alone.  Would recommend SNF level rehab unless she can arrange capable 24 hour assistance for home.  If home would need RW, w/c, and 3:1.     Follow Up Recommendations Supervision/Assistance - 24 hour;SNF    Equipment Recommendations  Wheelchair (measurements PT);Rolling walker with 5" wheels;3in1 (PT)    Recommendations for Other Services       Precautions / Restrictions Precautions Precautions: Fall Precaution Comments: reports falls at home      Mobility  Bed Mobility Overal bed mobility: Needs Assistance Bed Mobility: Supine to Sit     Supine to sit: Min assist     General bed mobility comments: for lines, safety  Transfers Overall transfer level: Needs assistance Equipment used: Rolling walker (2 wheeled) Transfers: Sit to/from Stand Sit to Stand: Min assist         General transfer comment: assist for balance, safety, hand placement, some ataxia upon standing erect  Ambulation/Gait Ambulation/Gait assistance: Min assist;Mod assist Gait Distance (Feet): 70 Feet(& 15' x 2) Assistive device: Rolling walker (2 wheeled) Gait Pattern/deviations: Step-to pattern;Step-through pattern;Trunk flexed;Wide base of support;Decreased stride length;Ataxic      General Gait Details: pushing walker out at a distance, keeping trunk flexed and moving too fast until assisted to keep walker closer, cues for upright posture and moving slower, mod A at times to manage walker safely; seated rest in hallway then returned to her room.  Also back and forth to bathroom with RW  Stairs            Wheelchair Mobility    Modified Rankin (Stroke Patients Only) Modified Rankin (Stroke Patients Only) Pre-Morbid Rankin Score: No significant disability Modified Rankin: Moderately severe disability     Balance Overall balance assessment: Needs assistance Sitting-balance support: Feet supported Sitting balance-Leahy Scale: Fair Sitting balance - Comments: seated EOB for vestibular assessment   Standing balance support: Bilateral upper extremity supported Standing balance-Leahy Scale: Poor Standing balance comment: UE support and min A for balance/safety                             Pertinent Vitals/Pain Pain Assessment: No/denies pain(no pain currently, but history of low back pain)    Home Living Family/patient expects to be discharged to:: Private residence Living Arrangements: Alone Available Help at Discharge: Family;Friend(s);Available PRN/intermittently Type of Home: House Home Access: Stairs to enter   [employer] of Steps: 2 Home Layout: One level Home Equipment: None      Prior Function Level of Independence: Independent         Comments: was independent, but for about 3 weeks having symptoms and becoming more difficult to move due to weakness and stayed in bed     Hand Dominance        Extremity/Trunk Assessment  Upper Extremity Assessment Upper Extremity Assessment: Defer to OT evaluation    Lower Extremity Assessment Lower Extremity Assessment: Generalized weakness    Cervical / Trunk Assessment Cervical / Trunk Assessment: Other exceptions Cervical / Trunk Exceptions: head tipped to L,  able to correct with cues, but returned to L tilt  Communication   Communication: Expressive difficulties(some intermittent slurred speech and stuttering)  Cognition Arousal/Alertness: Awake/alert Behavior During Therapy: Anxious Overall Cognitive Status: No family/caregiver present to determine baseline cognitive functioning                                 General Comments: patient intermittently slow to respond to commands and slow to speak with mouth oriented but words not coming out; somewhat concerned about her medication issues, discussed with MD, but had not really taken much of the stronger antibiotic      General Comments General comments (skin integrity, edema, etc.): vital not monitored with mobility due to battery dead on portable box.  Vestibular screen initiated with no noted issues with oculomotor movements, saccades or smooth pursuits, and negative head thrust test.  Did not finish with positional testing for BPPV due to pt in bathroom wtih NT to assist with bath end of session.    Exercises     Assessment/Plan    PT Assessment Patient needs continued PT services  PT Problem List Decreased strength;Decreased mobility;Decreased safety awareness;Decreased knowledge of precautions;Decreased knowledge of use of DME;Decreased balance       PT Treatment Interventions DME instruction;Therapeutic activities;Balance training;Functional mobility training;Gait training;Therapeutic exercise;Wheelchair mobility training;Neuromuscular re-education;Patient/family education    PT Goals (Current goals can be found in the Care Plan section)  Acute Rehab PT Goals Patient Stated Goal: none stated PT Goal Formulation: With patient Time For Goal Achievement: 08/18/19 Potential to Achieve Goals: Good    Frequency Min 3X/week   Barriers to discharge        Co-evaluation               AM-PAC PT "6 Clicks" Mobility  Outcome Measure Help needed turning from your  back to your side while in a flat bed without using bedrails?: A Little Help needed moving from lying on your back to sitting on the side of a flat bed without using bedrails?: A Little Help needed moving to and from a bed to a chair (including a wheelchair)?: A Little Help needed standing up from a chair using your arms (e.g., wheelchair or bedside chair)?: A Little Help needed to walk in hospital room?: A Lot Help needed climbing 3-5 steps with a railing? : A Lot 6 Click Score: 16    End of Session Equipment Utilized During Treatment: Gait belt Activity Tolerance: Patient limited by fatigue Patient left: with nursing/sitter in room;Other (comment)(in bathroom prepping for bath with NT) Nurse Communication: Mobility status PT Visit Diagnosis: Other abnormalities of gait and mobility (R26.89);Ataxic gait (R26.0);History of falling (Z91.81);Other symptoms and signs involving the nervous system (R29.898)    Time: [id number] PT Time Calculation (min) (ACUTE ONLY): 49 min   Charges:   PT Evaluation $PT Eval Moderate Complexity: 1 Mod PT Treatments $Gait Training: 8-22 mins $Therapeutic Activity: 8-22 mins        Magda Kiel, [place] [facility] [phone] 08/04/2019   Reginia Naas 08/04/2019, 3:01 PM

## 2019-08-04 NOTE — Consult Note (Signed)
Neurology Consultation  Reason for Consult: Slurred speech Referring Physician: Dr. Doristine Bosworth  CC: Slurred speech, dizziness  History is obtained from: Patient, chart  HPI: Rhonda Hickman is a 64 y.o. female past medical history significant for anxiety, bipolar disorder, COPD, chronic left knee pain on tramadol, diabetes, diabetic neuropathy, lumbar spine problems, presented to [facility] for evaluation of slurred speech, vertigo and weakness that the family said has been going on now for 3 weeks. She was transferred to [facility] to obtain an MRI due to MRI not being available on the weekend over at the other hospital. MRI brain was unremarkable for any acute process. Neurological consultation was placed for dizziness and speech abnormalities. She describes the speech abnormality as her speech being slurred and her being "talking like a drunk".  She denies this happened before but according to the RN, the family reports that she has had some problems-unclear details-with tramadol overuse. Patient reports that her bipolar disorder management has been satisfactory but recently she has been under a lot of stressors. Denies any current suicidal or homicidal ideation.  I called sister-Rhonda Hickman [phone] says that she has not really noticed any difference in her speech and speaks with her once in a while as she does not live in the same town.  She was not able to provide much history.   LKW: 3 weeks ago tpa given?: no, outside the window Premorbid modified Rankin scale (mRS): 2  ROS: Performed and negative except noted in HPI.  Past Medical History:  Diagnosis Date  . Anxiety   . Asthma   . Bipolar 1 disorder ([place])   . Bulging lumbar disc   . Chronic pain of left knee   . COPD (chronic obstructive pulmonary disease) ([place])   . Diabetes mellitus   . Neuropathy      Family History  Problem Relation Age of Onset  . Diabetes Mother   . Heart attack Father     Social History:   reports that she has been smoking cigarettes. She has been smoking about 0.25 packs per day. She has never used smokeless tobacco. She reports that she does not drink alcohol or use drugs.  Medications  Current Facility-Administered Medications:  .   stroke: mapping our early stages of recovery book, , Does not apply, Once, Rhonda Milan, MD .  0.9 %  sodium chloride infusion, , Intravenous, Continuous, Rhonda Dubois, MD, Last Rate: 75 mL/hr at 08/03/19 0920, New Bag at 08/03/19 0920 .  acetaminophen (TYLENOL) tablet 650 mg, 650 mg, Oral, Q6H PRN, 650 mg at 08/03/19 2226 **OR** acetaminophen (TYLENOL) suppository 650 mg, 650 mg, Rectal, Q6H PRN, Rhonda Milan, MD .  albuterol (VENTOLIN HFA) 108 (90 Base) MCG/ACT inhaler 2 puff, 2 puff, Inhalation, Q4H PRN, Rhonda Dubois, MD .  aspirin EC tablet 325 mg, 325 mg, Oral, Daily, Rhonda Milan, MD, 325 mg at 08/04/19 1030 .  citalopram (CELEXA) tablet 20 mg, 20 mg, Oral, Daily, Rhonda Dubois, MD, 20 mg at 08/04/19 1031 .  divalproex (DEPAKOTE) DR tablet 1,000 mg, 1,000 mg, Oral, QHS, Rhonda Dubois, MD, 1,000 mg at 08/03/19 2214 .  divalproex (DEPAKOTE) DR tablet 500 mg, 500 mg, Oral, q morning - 10a, Rhonda Dubois, MD, 500 mg at 08/04/19 1030 .  DULoxetine (CYMBALTA) DR capsule 30 mg, 30 mg, Oral, Daily, Rhonda Dubois, MD, 30 mg at 08/04/19 1030 .  famotidine (PEPCID) tablet 20 mg, 20 mg, Oral, BID, Rhonda Milan, MD, 20 mg  at 08/04/19 1030 .  fluticasone furoate-vilanterol (BREO ELLIPTA) 200-25 MCG/INH 1 puff, 1 puff, Inhalation, Daily, Rhonda Dubois, MD, 1 puff at 08/04/19 [phone] .  gabapentin (NEURONTIN) capsule 900 mg, 900 mg, Oral, TID, Rhonda Dubois, MD, 900 mg at 08/04/19 1030 .  heparin injection 5,000 Units, 5,000 Units, Subcutaneous, Q8H, Rhonda Dubois, MD, 5,000 Units at 08/04/19 [phone] .  insulin aspart (novoLOG) injection 0-15 Units, 0-15 Units, Subcutaneous, TID WC, Rhonda Dubois, MD, 5  Units at 08/04/19 1029 .  insulin detemir (LEVEMIR) injection 12 Units, 12 Units, Subcutaneous, BID, Rhonda Dubois, MD, 12 Units at 08/03/19 2303 .  lisinopril (ZESTRIL) tablet 5 mg, 5 mg, Oral, Daily, Rhonda Dubois, MD, 5 mg at 08/04/19 1031 .  meclizine (ANTIVERT) tablet 25 mg, 25 mg, Oral, TID PRN, Rhonda Milan, MD .  ondansetron [facility]) tablet 4 mg, 4 mg, Oral, Q6H PRN **OR** ondansetron (ZOFRAN) injection 4 mg, 4 mg, Intravenous, Q6H PRN, Rhonda Milan, MD .  oxybutynin [facility]) tablet 5 mg, 5 mg, Oral, BID, Rhonda Dubois, MD, 5 mg at 08/03/19 2303 .  pantoprazole (PROTONIX) EC tablet 40 mg, 40 mg, Oral, Daily, Rhonda Milan, MD, 40 mg at 08/04/19 1031 .  umeclidinium bromide (INCRUSE ELLIPTA) 62.5 MCG/INH 1 puff, 1 puff, Inhalation, Daily, Rhonda Dubois, MD, 1 puff at 08/04/19 [id number]  Exam: Current vital signs: BP 131/78 (BP Location: Left Arm)   Pulse 72   Temp 98.1 F (36.7 C) (Oral)   Resp 20   Ht 6\' 3"  (1.905 m)   Wt 135.6 kg   SpO2 95%   BMI 37.37 kg/m  Vital signs in last 24 hours: Temp:  [98 F (36.7 C)-98.8 F (37.1 C)] 98.1 F (36.7 C) (01/24 0135) Pulse Rate:  [64-84] 72 (01/24 0814) Resp:  [13-22] 20 (01/24 0814) BP: (117-131)/(61-85) 131/78 (01/24 0135) SpO2:  [93 %-98 %] 95 % (01/24 0135)  GENERAL: Awake, alert in NAD HEENT: - Normocephalic and atraumatic, dry mm, no LN++, no Thyromegally LUNGS - Clear to auscultation bilaterally with no wheezes CV - S1S2 RRR, no m/r/g, equal pulses bilaterally. ABDOMEN - Soft, nontender, nondistended with normoactive BS Ext: warm, well perfused, intact peripheral pulses, no edema  NEURO:  Mental Status: AA&Ox3 Language: speech is mildly dysarthric but at times is clear.  Naming, repetition, fluency, and comprehension intact. Cranial Nerves: PERRL. EOMI, visual fields full,appeared to have left lower facial weakness at rest but is edentulous and no weakness noted on smiling.facial sensation intact,  hearing intact, tongue/uvula/soft palate midline, normal sternocleidomastoid and trapezius muscle strength. No evidence of tongue atrophy or fibrillations Motor: b/l UE 5/5. RLE 5/5 and LLE 4+/5 (question effort) Tone: is normal and bulk is normal Sensation- Intact to light touch bilaterally Coordination: FTN intact bilaterally, no ataxia in BLE. Gait- deferred NIHSS - 2   Labs I have reviewed labs in epic and the results pertinent to this consultation are:  CBC    Component Value Date/Time   WBC 7.4 08/04/2019 0548   RBC 4.14 08/04/2019 0548   HGB 11.7 (L) 08/04/2019 0548   HCT 36.1 08/04/2019 0548   PLT 271 08/04/2019 0548   MCV 87.2 08/04/2019 0548   MCH 28.3 08/04/2019 0548   MCHC 32.4 08/04/2019 0548   RDW 14.4 08/04/2019 0548   LYMPHSABS 4.9 (H) 08/02/2019 2335   MONOABS 1.8 (H) 08/02/2019 2335   EOSABS 0.0 08/02/2019 2335   BASOSABS 0.1 08/02/2019 2335    CMP     Component Value Date/Time  NA 139 08/04/2019 0548   K 4.4 08/04/2019 0548   CL 105 08/04/2019 0548   CO2 24 08/04/2019 0548   GLUCOSE 197 (H) 08/04/2019 0548   BUN 13 08/04/2019 0548   CREATININE 1.02 (H) 08/04/2019 0548   CALCIUM 8.6 (L) 08/04/2019 0548   PROT 8.0 02/11/2014 0925   ALBUMIN 3.8 02/11/2014 0925   AST 23 02/11/2014 0925   ALT 21 02/11/2014 0925   ALKPHOS 87 02/11/2014 0925   BILITOT 0.2 (L) 02/11/2014 0925   GFRNONAA 59 (L) 08/04/2019 0548   GFRAA >60 08/04/2019 0548    Lipid Panel     Component Value Date/Time   CHOL 146 08/04/2019 0548   TRIG 161 (H) 08/04/2019 0548   HDL 13 (L) 08/04/2019 0548   CHOLHDL 11.2 08/04/2019 0548   VLDL 32 08/04/2019 0548   LDLCALC 101 (H) 08/04/2019 0548   Imaging I have reviewed the images obtained: CT-scan of the brain - no acute changes MRI examination of the brain-significantly motion degraded.  No evidence of acute infarct.  Moderate T2 hyperintensity within the cerebral white matter-nonspecific likely chronic small  vessel.  Assessment: 64 year old woman past medical history of anxiety bipolar disorder COPD chronic left knee pain on tramadol diabetes and diabetic neuropathy presented to [facility] for evaluation of slurred speech and dizziness that has been going on for 3 weeks. On examination, she does have a little bit of stutter/dysarthria in her speech but it appears volitional.  She has some effort dependent left leg weakness as well but she attributes some of that to her back pain. MRI brain does not show an acute infarct although it was severely motion riddled. A small brainstem stroke could have been missed with that amount of motion and limitations of the technique but the description of her symptoms is not a sudden onset of focal neurological deficit and is rather ongoing difficulty with dizziness and gait. There was some concern raised by family member about her using too much tramadol, which could actually explain dizziness/lightheadedness as well as gait related issues.  I am not really sure how to explain her dysarthria/stuttering. I believe the overall picture is consistent with a nonneurological etiology, and given the fact that she has had symptoms for 3 weeks, not to see any abnormality on the MRI would be rather unusual for a stroke.  Impression: Stuttering speech Dizziness  Recommendations: I would recommend minimizing sedating medications in general with specific focus on reducing tramadol.  In my conversation with the patient, she said that she takes 2 tablets every 6 hours for pain when she is prescribed only 1 tablet every 6 hours for pain. Other than that, rest of the care per primary team as you are. I would not recommend any further imaging at this time. Please call neurology with questions. Plan relayed to Dr. Doristine Bosworth.   -- Amie Portland, MD [facility] Pager: [phone] If 7pm to 7am, please call on call as listed on AMION.

## 2019-08-04 NOTE — Progress Notes (Signed)
PROGRESS NOTE    Rhonda Hickman  [id number] DOB: Jan 27, 1956 DOA: 08/02/2019 PCP: [facility] [place]     Brief Narrative:  As per H&P written by Dr. Olevia Bowens on 08/02/2019 64 y.o. female with medical history significant of anxiety, asthma, bipolar 1 disorder, bulging lumbar disc, chronic pain to left knee, COPD, type 2 diabetes, diabetic peripheral neuropathy who is coming to the emergency department with complaints of being dizzy for the past 2 weeks.  She also mentions that for the past 3 days her dizziness has become worse and is now associated with slurred speech and generalized weakness.  She denies headache, diplopia or blurred vision.  She has been having trouble ambulating, but denies having any falls.  No fever, chills, sore throat or rhinorrhea.  Denies dyspnea, chest pain, palpitations, diaphoresis, orthopnea or recent lower extremity edema.  No abdominal pain, nausea, vomiting, diarrhea, constipation, melena or hematochezia.  No dysuria, frequency or hematuria.  Denies polyuria, polydipsia, polyphagia or blurred vision.  Patient stated that she has not have any of her medications in the past 3 days.  She has been laying in bed since then and has been feeling too weak to go to the pharmacy.  ED Course: Initial vital signs temperature 99 F, pulse 85, respirations 16, blood pressure 139/76 mmHg and O2 sat 95% on room air.  White count is 14.5, hemoglobin 11.8 g/dL and platelets 277.  BMP shows sodium 131, potassium 3.5, chloride 96 and CO2 24 mmol/L.  Her glucose 301, BUN 16, creatinine 1.36 and calcium 8.4 mg/dL.  EKG was sinus rhythm.  CT head did not show any acute on normalities.  Assessment & Plan: 1-vertigo/weakness and slurred speech. -CT head negative for acute abnormalities.  Patient initially presented to [facility] ED and was transferred to [facility] yesterday for further work-up such as MRI and possible neuro consultation.  Patient tells me that she has a  started having the slurred speech and stuttering since about 3 weeks.  Prior to that she was fluent.  She has left upper extremity weakness but she tells me that this is going on for more than 2 months and she is getting OT as outpatient.  MRI is done and this does not show any acute or chronic stroke.  She also has unusual upper body tremors and intermittently right facial droop on exam.  I have consulted neurology for their input.  PT OT on board.  2-Type 2 diabetes mellitus with nephropathy ([place]): Blood pressure elevated.  Will increase Lantus to 12 units twice daily and continue SSI.  3-Urinary incontinence -No dysuria reported -Continue Ditropan.  4-chronic kidney disease a stage IIIa: At her baseline.  Watch closely.  5-Gastroesophageal reflux disease -Continue PPI  6-leukocytosis: Resolved.  No fever.  No signs of infection.  7-depression/anxiety/bipolar 1 disorder -Continue Celexa and Depakote  8-history of COPD: Stable.  Continue home medications.  9-class II obesity -Body mass index is 37.37 kg/m. -Low calorie diet, portion control and increase physical activity discussed with patient.  10-essential hypertension: Controlled.  Continue lisinopril.  Now that she has no acute stroke, no need for permissive hypertension.  DVT prophylaxis: Heparin. Code Status: Full code Family Communication: No family at bedside.  Called her sister over the phone and updated her. Disposition Plan: We will likely be discharged in next 1 to 2 days once cleared by neurology and seen by PT OT.  Consultants:   Neurology  Procedures:  None  Antimicrobials:  Anti-infectives (From admission, onward)  None       Subjective: Seen and examined.  The weakness has resolved however she continues to have dysarthria and stuttering.  Objective: Vitals:   08/03/19 1954 08/03/19 1957 08/04/19 0135 08/04/19 0814  BP:   131/78   Pulse:  64 65 72  Resp:  13 (!) 22 20  Temp: 98 F (36.7 C)   98.1 F (36.7 C)   TempSrc: Oral  Oral   SpO2: 96% 96% 95%   Weight:      Height:        Intake/Output Summary (Last 24 hours) at 08/04/2019 1048 Last data filed at 08/04/2019 0700 Gross per 24 hour  Intake 781.48 ml  Output 701 ml  Net 80.48 ml   Filed Weights   08/02/19 2253  Weight: 135.6 kg    Examination: General exam: Appears calm and comfortable  Respiratory system: Clear to auscultation. Respiratory effort normal. Cardiovascular system: S1 & S2 heard, RRR. No JVD, murmurs, rubs, gallops or clicks. No pedal edema. Gastrointestinal system: Abdomen is nondistended, soft and nontender. No organomegaly or masses felt. Normal bowel sounds heard. Central nervous system: Alert and oriented. No focal neurological deficits.  Some dysarthria and stuttering of speech. Extremities: Symmetric 5 x 5 power. Skin: No rashes, lesions or ulcers.  Psychiatry: Judgement and insight appear normal. Mood & affect appropriate.   Data Reviewed: I have personally reviewed following labs and imaging studies  CBC: Recent Labs  Lab 08/02/19 2335 08/04/19 0548  WBC 14.5* 7.4  NEUTROABS 7.6  --   HGB 11.8* 11.7*  HCT 37.0 36.1  MCV 87.5 87.2  PLT 277 [id number]   Basic Metabolic Panel: Recent Labs  Lab 08/02/19 2335 08/04/19 0548  NA 131* 139  K 3.5 4.4  CL 96* 105  CO2 24 24  GLUCOSE 301* 197*  BUN 16 13  CREATININE 1.36* 1.02*  CALCIUM 8.4* 8.6*   GFR: Estimated Creatinine Clearance: 92.3 mL/min (A) (by C-G formula based on SCr of 1.02 mg/dL (H)).  Urine analysis:    Component Value Date/Time   COLORURINE YELLOW 09/12/2013 1642   APPEARANCEUR CLEAR 09/12/2013 1642   LABSPEC >1.030 (H) 09/12/2013 1642   PHURINE 5.0 09/12/2013 1642   GLUCOSEU >1000 (A) 09/12/2013 1642   HGBUR NEGATIVE 09/12/2013 1642   BILIRUBINUR NEGATIVE 09/12/2013 1642   KETONESUR NEGATIVE 09/12/2013 1642   PROTEINUR NEGATIVE 09/12/2013 1642   UROBILINOGEN 0.2 09/12/2013 1642   NITRITE NEGATIVE 09/12/2013  1642   LEUKOCYTESUR NEGATIVE 09/12/2013 1642   Radiology Studies: CT HEAD WO CONTRAST  Result Date: 08/03/2019 CLINICAL DATA:  Vertigo. Dizziness and slurred speech. Weakness. EXAM: CT HEAD WITHOUT CONTRAST TECHNIQUE: Contiguous axial images were obtained from the base of the skull through the vertex without intravenous contrast. COMPARISON:  Head CT 01/17/2018 FINDINGS: Brain: No evidence of acute infarction, hemorrhage, hydrocephalus, extra-axial collection or mass lesion/mass effect. Brain volume is normal for age. Minimal chronic small vessel ischemia. Vascular: Atherosclerosis of skullbase vasculature without hyperdense vessel or abnormal calcification. Skull: No fracture or focal lesion. Sinuses/Orbits: Paranasal sinuses and mastoid air cells are clear. The visualized orbits are unremarkable. Other: None. IMPRESSION: No acute intracranial abnormality. Mild chronic small vessel ischemia, stable from prior. Electronically Signed   By: Keith Rake M.D.   On: 08/03/2019 01:08   MR BRAIN WO CONTRAST  Result Date: 08/03/2019 CLINICAL DATA:  Focal neuro deficit, greater than 6 hours, stroke suspect additional history provided: Patient reports still feeling dizzy, generally weak and with low energy level,  TIA workup EXAM: MRI HEAD WITHOUT CONTRAST TECHNIQUE: Multiplanar, multiecho pulse sequences of the brain and surrounding structures were obtained without intravenous contrast. COMPARISON:  Head CT 08/03/2019 FINDINGS: Brain: The examination is significantly motion degraded. Most notably, there is moderate/severe motion degradation of the axial diffusion-weighted imaging, moderate/severe motion degradation of the sagittal T1 weighted sequence, moderate motion degradation of the axial T2 weighted sequence and axial T2 FLAIR sequence, moderate motion degradation of the axial SWI sequence, moderate/severe motion degradation of the axial T1 weighted sequence and moderate/severe motion degradation of the  coronal T2 weighted sequence. The coronal diffusion-weighted sequence is also mildly motion degraded. Within described limitations, there is no convincing evidence of acute infarct. No evidence of intracranial mass. No midline shift or extra-axial fluid collection identified. No definite chronic intracranial blood products identified. Moderate patchy T2/FLAIR hyperintensity within the cerebral white matter is nonspecific. Cerebral volume is normal for age. Vascular: Flow voids maintained within the proximal large arterial vessels. Skull and upper cervical spine: No focal marrow lesion identified. Sinuses/Orbits: No orbital abnormality identified. Mild ethmoid sinus mucosal thickening. No significant mastoid effusion. IMPRESSION: Significantly motion degraded examination as described. The axial diffusion-weighted sequence is moderate to severely motion degraded. The coronal diffusion-weighted sequence is mildly motion degraded. No evidence of acute infarct. No acute intracranial abnormality identified. Moderate patchy T2 hyperintensity within the cerebral white matter is nonspecific, but most commonly seen in the setting of chronic small vessel ischemic disease. Electronically Signed   By: Kellie Simmering DO   On: 08/03/2019 19:38   ECHOCARDIOGRAM COMPLETE  Result Date: 08/03/2019   ECHOCARDIOGRAM REPORT   Patient Name:   Rhonda Hickman Date of Exam: 08/03/2019 Medical Rec #:  [id number]      Height:       75.0 in Accession #:    [id number]     Weight:       298.9 lb Date of Birth:  1955/09/05      BSA:          2.60 m Patient Age:    35 years       BP:           127/75 mmHg Patient Gender: F              HR:           74 bpm. Exam Location:  [name] Procedure: 2D Echo, Cardiac Doppler and Color Doppler Indications:    TIA 435.9 / G45.9  History:        Patient has no prior history of Echocardiogram examinations.                 COPD; Risk Factors:Diabetes. Slurred speech,GERD,Cocaine abuse.  Sonographer:     Alvino Chapel RCS Referring Phys: [id number] [place]  1. Left ventricular ejection fraction, by visual estimation, is 60 to 65%. The left ventricle has normal function. There is mildly increased left ventricular hypertrophy.  2. Left ventricular diastolic function could not be evaluated.  3. The left ventricle has no regional wall motion abnormalities.  4. Global right ventricle has normal systolic function.The right ventricular size is normal.  5. Left atrial size was normal.  6. Right atrial size was normal.  7. The mitral valve is normal in structure. Trivial mitral valve regurgitation. No evidence of mitral stenosis.  8. The tricuspid valve is normal in structure. Tricuspid valve regurgitation is trivial.  9. The aortic valve is tricuspid. Aortic valve regurgitation is not visualized. Mild aortic  valve sclerosis without stenosis. 10. The pulmonic valve was not well visualized. Pulmonic valve regurgitation is not visualized. 11. The inferior vena cava is normal in size with greater than 50% respiratory variability, suggesting right atrial pressure of 3 mmHg. 12. Pt in atrial flutter at the time of the study; normal LV systolic function; mild LVH. FINDINGS  Left Ventricle: Left ventricular ejection fraction, by visual estimation, is 60 to 65%. The left ventricle has normal function. The left ventricle has no regional wall motion abnormalities. There is mildly increased left ventricular hypertrophy. Left ventricular diastolic function could not be evaluated. Normal left atrial pressure. Right Ventricle: The right ventricular size is normal.Global RV systolic function is has normal systolic function. The tricuspid regurgitant velocity is 2.43 m/s, and with an assumed right atrial pressure of 3 mmHg, the estimated right ventricular systolic pressure is normal at 26.6 mmHg. Left Atrium: Left atrial size was normal in size. Right Atrium: Right atrial size was normal in size Pericardium: There is no  evidence of pericardial effusion. Mitral Valve: The mitral valve is normal in structure. Trivial mitral valve regurgitation. No evidence of mitral valve stenosis by observation. Tricuspid Valve: The tricuspid valve is normal in structure. Tricuspid valve regurgitation is trivial. Aortic Valve: The aortic valve is tricuspid. Aortic valve regurgitation is not visualized. Mild aortic valve sclerosis is present, with no evidence of aortic valve stenosis. Pulmonic Valve: The pulmonic valve was not well visualized. Pulmonic valve regurgitation is not visualized. Pulmonic regurgitation is not visualized. Aorta: The aortic root is normal in size and structure. Venous: The inferior vena cava is normal in size with greater than 50% respiratory variability, suggesting right atrial pressure of 3 mmHg.  Additional Comments: Pt in atrial flutter at the time of the study; normal LV systolic function; mild LVH.  LEFT VENTRICLE PLAX 2D LVIDd:         4.40 cm       Diastology LVIDs:         2.65 cm       LV e' lateral:   14.50 cm/s LV PW:         1.19 cm       LV E/e' lateral: 7.2 LV IVS:        1.19 cm       LV e' medial:    10.90 cm/s LVOT diam:     2.00 cm       LV E/e' medial:  9.6 LV SV:         62 ml LV SV Index:   22.71 LVOT Area:     3.14 cm  LV Volumes (MOD) LV area d, A2C:    28.10 cm LV area d, A4C:    34.50 cm LV area s, A2C:    12.00 cm LV area s, A4C:    16.20 cm LV major d, A2C:   8.03 cm LV major d, A4C:   8.29 cm LV major s, A2C:   5.88 cm LV major s, A4C:   6.69 cm LV vol d, MOD A2C: 83.1 ml LV vol d, MOD A4C: 118.0 ml LV vol s, MOD A2C: 21.2 ml LV vol s, MOD A4C: 32.6 ml LV SV MOD A2C:     61.9 ml LV SV MOD A4C:     118.0 ml LV SV MOD BP:      72.7 ml RIGHT VENTRICLE RV S prime:     13.80 cm/s TAPSE (M-mode): 2.5 cm LEFT ATRIUM  Index       RIGHT ATRIUM           Index LA diam:        3.90 cm 1.50 cm/m  RA Area:     17.50 cm LA Vol (A2C):   58.5 ml 22.47 ml/m RA Volume:   47.00 ml  18.05 ml/m LA  Vol (A4C):   56.6 ml 21.74 ml/m LA Biplane Vol: 60.0 ml 23.05 ml/m  AORTIC VALVE LVOT Vmax:   177.50 cm/s LVOT Vmean:  104.000 cm/s LVOT VTI:    0.250 m  AORTA Ao Root diam: 3.40 cm MITRAL VALVE                         TRICUSPID VALVE MV Area (PHT): 5.66 cm              TR Peak grad:   23.6 mmHg MV PHT:        38.86 msec            TR Vmax:        243.00 cm/s MV Decel Time: 134 msec MV E velocity: 105.00 cm/s 103 cm/s  SHUNTS MV A velocity: 73.70 cm/s  70.3 cm/s Systemic VTI:  0.25 m MV E/A ratio:  1.42        1.5       Systemic Diam: 2.00 cm  Kirk Ruths MD Electronically signed by Kirk Ruths MD Signature Date/Time: 08/03/2019/3:14:37 PM    Final     Scheduled Meds: .  stroke: mapping our early stages of recovery book   Does not apply Once  . aspirin EC  325 mg Oral Daily  . citalopram  20 mg Oral Daily  . divalproex  1,000 mg Oral QHS  . divalproex  500 mg Oral q morning - 10a  . DULoxetine  30 mg Oral Daily  . famotidine  20 mg Oral BID  . fluticasone furoate-vilanterol  1 puff Inhalation Daily  . gabapentin  900 mg Oral TID  . heparin injection (subcutaneous)  5,000 Units Subcutaneous Q8H  . insulin aspart  0-15 Units Subcutaneous TID WC  . insulin detemir  12 Units Subcutaneous BID  . lisinopril  5 mg Oral Daily  . oxybutynin  5 mg Oral BID  . pantoprazole  40 mg Oral Daily  . umeclidinium bromide  1 puff Inhalation Daily   Continuous Infusions: . sodium chloride 75 mL/hr at 08/03/19 0920     LOS: 0 days   Time spent: 37 minutes  Darliss Cheney, MD [facility]  08/04/2019, 10:48 AM

## 2019-08-04 NOTE — Evaluation (Signed)
Occupational Therapy Evaluation Patient Details Name: Rhonda Hickman MRN: [id number] DOB: April 20, 1956 Today's Date: 08/04/2019    History of Present Illness 64 y.o. female with medical history significant of anxiety, asthma, bipolar 1 disorder, bulging lumbar disc, chronic pain to left knee, COPD, type 2 diabetes, diabetic peripheral neuropathy admitted with complaints of being dizzy for the past 2 weeks.  MRI negative for stroke.   Clinical Impression   Pt admitted with above. She demonstrates the below listed deficits and will benefit from continued OT to maximize safety and independence with BADLs.  Pt presents to OT with impaired balance, decreased safety awareness, and generalized weakness.  She currently requires min A for ADLs and functional mobility.  She is impulsive and requires cues for walker safety.  She does endorse frequent falls at home.  She reports she lives alone and was fully independent PTA.  Feel she will need 24 hour supervision at discharge due to fall risk.  If this is not available, recommend SNF level rehab.       Follow Up Recommendations  SNF;Supervision/Assistance - 24 hour    Equipment Recommendations  Tub/shower bench    Recommendations for Other Services       Precautions / Restrictions Precautions Precautions: Fall Precaution Comments: reports falls at home      Mobility Bed Mobility Overal bed mobility: Needs Assistance Bed Mobility: Supine to Sit;Sit to Supine     Supine to sit: Min guard Sit to supine: Min guard   General bed mobility comments: for lines, safety  Transfers Overall transfer level: Needs assistance Equipment used: Rolling walker (2 wheeled) Transfers: Sit to/from [employer] Sit to Stand: Min assist Stand pivot transfers: Min assist       General transfer comment: assist for balance adn safety     Balance Overall balance assessment: Needs assistance Sitting-balance support: Feet supported Sitting  balance-Leahy Scale: Fair Sitting balance - Comments: able to maintain static sitting with min guard assist    Standing balance support: Bilateral upper extremity supported Standing balance-Leahy Scale: Poor Standing balance comment: requires UE support and up to min A                            ADL either performed or assessed with clinical judgement   ADL Overall ADL's : Needs assistance/impaired Eating/Feeding: Independent   Grooming: Wash/dry hands;Wash/dry face;Oral care;Brushing hair;Minimal assistance;Standing   Upper Body Bathing: Set up;Sitting   Lower Body Bathing: Minimal assistance;Sit to/from stand   Upper Body Dressing : Set up;Sitting   Lower Body Dressing: Minimal assistance;Sit to/from stand   Toilet Transfer: Minimal assistance;Ambulation;Comfort height toilet;Grab bars;RW [employer] forces technical [employer] Details (indicate cue type and reason): pt requirs mod cues for safety  Toileting- Clothing Manipulation and Hygiene: Minimal assistance;Sit to/from stand       Functional mobility during ADLs: Minimal assistance General ADL Comments: requires assist for balance      Vision         Perception     Praxis      Pertinent Vitals/Pain Pain Assessment: No/denies pain     Hand Dominance     Extremity/Trunk Assessment Upper Extremity Assessment Upper Extremity Assessment: Generalized weakness   Lower Extremity Assessment Lower Extremity Assessment: Defer to PT evaluation   Cervical / Trunk Assessment Cervical / Trunk Assessment: Other exceptions Cervical / Trunk Exceptions: head tipped to L, able to correct with cues, but returned to L tilt   Communication Communication Communication:  Expressive difficulties(some intermittent slurred speech and stuttering)   Cognition Arousal/Alertness: Awake/alert;Lethargic Behavior During Therapy: WFL for tasks assessed/performed;Impulsive Overall Cognitive Status: No family/caregiver present to determine baseline  cognitive functioning                                 General Comments: Pt demonstrates poor safety awareness    General Comments  discussed recommendation for follow up rehab at SNF or 24 hour assistance due to risk of falls. She states that family can stay with her, however, RN [place]     Exercises     Shoulder Instructions      Home Living Family/patient expects to be discharged to:: Private residence Living Arrangements: Alone Available Help at Discharge: Family;Friend(s);Available PRN/intermittently Type of Home: House Home Access: Stairs to enter [employer] of Steps: 2   Home Layout: One level               Home Equipment: None          Prior Functioning/Environment Level of Independence: Independent        Comments: was independent, but for about 3 weeks having symptoms and becoming more difficult to move due to weakness and stayed in bed        OT Problem List:        OT Treatment/Interventions:      OT Goals(Current goals can be found in the care plan section) Acute Rehab OT Goals Patient Stated Goal: to go home and have better balance  ADL Goals Pt Will Perform Grooming: with supervision;standing Pt Will Perform Upper Body Bathing: with set-up;sitting Pt Will Perform Lower Body Dressing: with supervision;sit to/from stand Pt Will Transfer to Toilet: with supervision;ambulating;regular height toilet;grab bars Pt Will Perform Toileting - Clothing Manipulation and hygiene: with supervision;sit to/from stand Pt Will Perform Tub/Shower Transfer: Tub transfer;ambulating;tub bench;rolling walker;with supervision  OT Frequency: Min 2X/week   Barriers to D/C:            Co-evaluation              AM-PAC OT "6 Clicks" Daily Activity     Outcome Measure Help from another person eating meals?: None Help from another person taking care of personal grooming?: A Little Help from another person  toileting, which includes using toliet, bedpan, or urinal?: A Little Help from another person bathing (including washing, rinsing, drying)?: A Little Help from another person to put on and taking off regular upper body clothing?: A Little Help from another person to put on and taking off regular lower body clothing?: A Little 6 Click Score: 19   End of Session Equipment Utilized During Treatment: Gait belt;Rolling walker Nurse Communication: Mobility status  Activity Tolerance: Patient tolerated treatment well Patient left: in bed;with call bell/phone within reach;with bed alarm set  OT Visit Diagnosis: Unsteadiness on feet (R26.81)                Time: [id number] OT Time Calculation (min): 37 min Charges:  OT General Charges $OT Visit: 1 Visit OT Evaluation $OT Eval Moderate Complexity: 1 Mod OT Treatments $Self Care/Home Management : 8-22 mins  Nilsa Nutting., OTR/L Acute Rehabilitation Services Pager [phone] Office [phone]   Lucille Passy M 08/04/2019, 6:22 PM

## 2019-08-04 NOTE — Plan of Care (Signed)

## 2019-08-05 DIAGNOSIS — E1165 Type 2 diabetes mellitus with hyperglycemia: Secondary | ICD-10-CM

## 2019-08-05 DIAGNOSIS — D72829 Elevated white blood cell count, unspecified: Secondary | ICD-10-CM

## 2019-08-05 DIAGNOSIS — K219 Gastro-esophageal reflux disease without esophagitis: Secondary | ICD-10-CM

## 2019-08-05 LAB — BASIC METABOLIC PANEL
Anion gap: 12 (ref 5–15)
BUN: 13 mg/dL (ref 8–23)
CO2: 24 mmol/L (ref 22–32)
Calcium: 8.7 mg/dL — ABNORMAL LOW (ref 8.9–10.3)
Chloride: 103 mmol/L (ref 98–111)
Creatinine, Ser: 0.92 mg/dL (ref 0.44–1.00)
GFR calc Af Amer: >60 mL/min (ref >60)
GFR calc non Af Amer: >60 mL/min (ref >60)
Glucose, Bld: 170 mg/dL — ABNORMAL HIGH (ref 70–99)
Potassium: 4.4 mmol/L (ref 3.5–5.1)
Sodium: 139 mmol/L (ref 135–145)

## 2019-08-05 LAB — GLUCOSE, CAPILLARY
Glucose-Capillary: 176 mg/dL — ABNORMAL HIGH (ref 70–99)
Glucose-Capillary: 155 mg/dL — ABNORMAL HIGH (ref 70–99)
Glucose-Capillary: 175 mg/dL — ABNORMAL HIGH (ref 70–99)

## 2019-08-05 LAB — CBC
HCT: 33.6 % — ABNORMAL LOW (ref 36.0–46.0)
Hemoglobin: 10.9 g/dL — ABNORMAL LOW (ref 12.0–15.0)
MCH: 27.9 pg (ref 26.0–34.0)
MCHC: 32.4 g/dL (ref 30.0–36.0)
MCV: 86.2 fL (ref 80.0–100.0)
Platelets: 286 10*3/uL (ref 150–400)
RBC: 3.9 MIL/uL (ref 3.87–5.11)
RDW: 14.1 % (ref 11.5–15.5)
WBC: 9 10*3/uL (ref 4.0–10.5)
nRBC: 0 % (ref 0.0–0.2)

## 2019-08-05 NOTE — Plan of Care (Signed)

## 2019-08-05 NOTE — Evaluation (Signed)
Speech Language Pathology Evaluation Patient Details Name: Rhonda Hickman MRN: [id number] DOB: Mar 06, 1956 Today's Date: 08/05/2019 Time: [id number] SLP Time Calculation (min) (ACUTE ONLY): 22 min  Problem List:  Patient Active Problem List   Diagnosis Date Noted  . Vertigo 08/03/2019  . Slurred speech 08/03/2019  . Leukocytosis 08/03/2019  . Bronchiectasis with acute exacerbation ([place])   . Obesity, Class III, BMI 40-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) ([place])   . Gastroesophageal reflux disease   . Asthma exacerbation 06/06/2018  . Gait abnormality 08/29/2017  . Urinary incontinence 08/29/2017  . Paresthesia 08/29/2017  . Derangement of posterior horn of medial meniscus of right knee   . Meniscus, lateral, derangement, right   . Arthritis of knee, right   . COPD with acute exacerbation ([place]) 03/09/2016  . Type 2 diabetes mellitus ([place]) 02/16/2007  . Cocaine abuse ([place]) 02/16/2007  . Extrinsic asthma 02/16/2007  . HOMELESSNESS, HX OF 02/16/2007   Past Medical History:  Past Medical History:  Diagnosis Date  . Anxiety   . Asthma   . Bipolar 1 disorder ([place])   . Bulging lumbar disc   . Chronic pain of left knee   . COPD (chronic obstructive pulmonary disease) ([place])   . Diabetes mellitus   . Neuropathy    Past Surgical History:  Past Surgical History:  Procedure Laterality Date  . ABDOMINAL HYSTERECTOMY    . CARPAL TUNNEL RELEASE Right   . KNEE ARTHROSCOPY WITH MEDIAL MENISECTOMY Right 04/14/2016   Procedure: KNEE ARTHROSCOPY WITH MEDIAL AND LATERAL MENISECTOMY, MICROFRACTURE REPAIR;  Surgeon: Carole Civil, MD;  Location: [facility];  Service: Orthopedics;  Laterality: Right;  lateral menisectomy - needs crutch training   HPI:  64 y.o. female with medical history significant of anxiety, asthma, bipolar 1 disorder, bulging lumbar disc, chronic pain to left knee, COPD, type 2 diabetes, diabetic peripheral neuropathy admitted with complaints of being dizzy for the past 2 weeks.  MRI  negative for stroke.   Assessment / Plan / Recommendation Clinical Impression  Pt has a dysfluent speech pattern with inconsistent errors noted and MRI negative for acute findings. Errors are noted at varying locations of a word and at times she produces very clear and fluent speech. She has different types of dysfluencies as well, including repetitions, elongations, and distortions, sometimes with completely halted speech. Her oral motor evaluation is WFL and her diadichokinetic rate was minimally slowed with individual sounds, but significantly skewed when combined ("puh tuh kuh"). Although pt has fluent expressive language spontaneously, during a picture description task she uses telegraphic speech, one-word responses, and does not use full sentences. Pt exhibits reduced safety awareness today as well as during PT/OT evaluations on previous date. Recommend SNF for safety upon discharge as pt does not appear to have enough assistance at home. Will defer SLP f/u to next level of care.     SLP Assessment  SLP Recommendation/Assessment: All further Speech Lanaguage Pathology  needs can be addressed in the next venue of care SLP Visit Diagnosis: Cognitive communication deficit (R41.841)    Follow Up Recommendations  Skilled Nursing facility;24 hour supervision/assistance    Frequency and Duration           SLP Evaluation Cognition  Overall Cognitive Status: No family/caregiver present to determine baseline cognitive functioning       Comprehension  Auditory Comprehension Overall Auditory Comprehension: Appears within functional limits for tasks assessed    Expression Expression Primary Mode of Expression: Verbal Verbal Expression Overall Verbal Expression: Appears within functional limits  for tasks assessed   Oral / Motor  Oral Motor/Sensory Function Overall Oral Motor/Sensory Function: Within functional limits Motor Speech Overall Motor Speech: Impaired Respiration: Within  functional limits Phonation: Normal Resonance: Within functional limits Articulation: Impaired Level of Impairment: Conversation Intelligibility: Intelligible Motor Speech Errors: Inconsistent   GO                     Osie Bond., M.A. [facility] Pager [phone] Office [phone]  08/05/2019, 12:37 PM

## 2019-08-05 NOTE — Progress Notes (Signed)
Physical Therapy Treatment Patient Details Name: Rhonda Hickman MRN: [id number] DOB: 15-Mar-1956 Today's Date: 08/05/2019    History of Present Illness 64 y.o. female with medical history significant of anxiety, asthma, bipolar 1 disorder, bulging lumbar disc, chronic pain to left knee, COPD, type 2 diabetes, diabetic peripheral neuropathy admitted with complaints of being dizzy for the past 2 weeks.  MRI negative for stroke.    PT Comments    Patient seen ambulating in hallway and took over for RN.  Still with difficulty with walker management, and trunk stability with some ataxia noted.  Returned second time with wide RW to see if able to keep trunk more stable, but then pt too far forward in walker at times and leaning back.  Noted pt without payor for SNF and family likely coming to take her to stay with them in [facility].  Recommend HHPT/aide and equipment as noted below.  PT to follow.    Follow Up Recommendations  Supervision/Assistance - 24 hour;Home health PT(HH aide)     Equipment Recommendations  Rolling walker with 5" wheels;3in1 (PT)(wide RW and wide 3:1)    Recommendations for Other Services       Precautions / Restrictions Precautions Precautions: Fall Precaution Comments: reports falls at home    Mobility  Bed Mobility Overal bed mobility: Needs Assistance Bed Mobility: Supine to Sit;Sit to Supine     Supine to sit: Supervision Sit to supine: Supervision   General bed mobility comments: increased time, assist for safety, pt throws herself around in bed  Transfers Overall transfer level: Needs assistance Equipment used: Rolling walker (2 wheeled) Transfers: Sit to/from Stand Sit to Stand: Min guard         General transfer comment: assist for balance/safety  Ambulation/Gait Ambulation/Gait assistance: Min assist;Mod assist Gait Distance (Feet): 120 Feet Assistive device: Rolling walker (2 wheeled) Gait Pattern/deviations: Trunk flexed;Wide base of  support;Decreased stride length;Step-through pattern;Ataxic     General Gait Details: limited trunk stability with flexed posture at times and then with cues for tall posture, tends to arch her back and lean backwards; walked x 1 in hallway with RN initiating, then returned second visit wtih bariwalker to walk in hallway with one seated rest break 2 x 70'.  Wider walker able to keep hips forward, but then too close to walker and leaning back   Stairs             Wheelchair Mobility    Modified Rankin (Stroke Patients Only) Modified Rankin (Stroke Patients Only) Pre-Morbid Rankin Score: No significant disability Modified Rankin: Moderately severe disability     Balance Overall balance assessment: Needs assistance Sitting-balance support: Feet supported Sitting balance-Leahy Scale: Good Sitting balance - Comments: leaning down to don socks   Standing balance support: Bilateral upper extremity supported Standing balance-Leahy Scale: Poor Standing balance comment: requires UE support and up to min A; LOB washing hands at sink min A for balance                            Cognition Arousal/Alertness: Awake/alert Behavior During Therapy: Impulsive Overall Cognitive Status: No family/caregiver present to determine baseline cognitive functioning                                        Exercises      General Comments General comments (skin integrity, edema, etc.):  Discussed options as no insurance for rehab, Sister to help and take pt to [facility] to stay with them      Pertinent Vitals/Pain Pain Assessment: No/denies pain    Home Living     Available Help at Discharge: Friend(s);Available PRN/intermittently Type of Home: House              Prior Function            PT Goals (current goals can now be found in the care plan section) Progress towards PT goals: Progressing toward goals    Frequency    Min 3X/week      PT Plan  Discharge plan needs to be updated    Co-evaluation              AM-PAC PT "6 Clicks" Mobility   Outcome Measure  Help needed turning from your back to your side while in a flat bed without using bedrails?: None Help needed moving from lying on your back to sitting on the side of a flat bed without using bedrails?: None Help needed moving to and from a bed to a chair (including a wheelchair)?: A Little Help needed standing up from a chair using your arms (e.g., wheelchair or bedside chair)?: A Little Help needed to walk in hospital room?: A Little Help needed climbing 3-5 steps with a railing? : A Lot 6 Click Score: 19    End of Session   Activity Tolerance: Patient limited by fatigue Patient left: in chair;with call bell/phone within reach   PT Visit Diagnosis: Other abnormalities of gait and mobility (R26.89);Ataxic gait (R26.0);History of falling (Z91.81);Other symptoms and signs involving the nervous system (R29.898)     Time: 1020-1035(1325-1355) PT Time Calculation (min) (ACUTE ONLY): 15 min  Charges:  $Gait Training: 38-52 mins                     Magda Kiel, [place] [phone] 08/05/2019    Reginia Naas 08/05/2019, 2:08 PM

## 2019-08-05 NOTE — Discharge Instructions (Signed)

## 2019-08-05 NOTE — Discharge Summary (Signed)
Physician Discharge Summary  Rhonda Hickman [id number] DOB: 1955/12/29 DOA: 08/02/2019  PCP: [name] Archer date: 0000000 Discharge date: 08/05/2019  Time spent: 45 minutes  Recommendations for Outpatient Follow-up:  Patient will be discharged to home with family.  Patient will need to follow up with primary care provider within one week of discharge.  Patient should continue medications as prescribed.  Patient should follow a carb modified diet.   Discharge Diagnoses:  Vertigo/weakness and slurred speech Diabetes mellitus, type II with neuropathy Urinary incontinence Essential hypertension GERD Depression/anxiety/bipolar 1 disorder History of COPD Orbit obesity Normocytic anemia  Discharge Condition: Stable  Diet recommendation: Carb modified   Filed Weights   08/02/19 2253  Weight: 135.6 kg    History of present illness:  On 08/03/2019 by Dr. Tennis Must Rhonda Hickman is a 64 y.o. female with medical history significant of anxiety, asthma, bipolar 1 disorder, bulging lumbar disc, chronic pain to left knee, COPD, type 2 diabetes, diabetic peripheral neuropathy who is coming to the emergency department with complaints of being dizzy for the past 2 weeks.  She also mentions that for the past 3 days her dizziness has become worse and is now associated with slurred speech and generalized weakness.  She denies headache, diplopia or blurred vision.  She has been having trouble ambulating, but denies having any falls.  No fever, chills, sore throat or rhinorrhea.  Denies dyspnea, chest pain, palpitations, diaphoresis, orthopnea or recent lower extremity edema.  No abdominal pain, nausea, vomiting, diarrhea, constipation, melena or hematochezia.  No dysuria, frequency or hematuria.  Denies polyuria, polydipsia, polyphagia or blurred vision.  Patient stated that she has not have any of her medications in the past 3 days.  She has been laying in bed since then  and has been feeling too weak to go to the pharmacy.  Hospital Course:  Vertigo/weakness and slurred speech -CT head showed no acute abnormalities -MRI: Months of acute infarct. -Echocardiogram showed an EF of 60 to 65%, LV function normal.  Mild LVH.  No regional wall motion abnormalities. -Hemoglobin A1c 8.7, LDL 101 -Patient was complaining of dizziness, slurred speech and stuttering 3 weeks prior to admission.  She was also having left upper extremity weakness and this is been ongoing for more than 2 months, for which she was receiving outpatient occupational therapy. -During examination, patient's speech was normal initially and then she started to have more stuttering and upper extremity shaking however this quickly dissipated when I told her that she would likely be going home.   -Neurology consulted and appreciated and felt that this may be due to tramadol use -PT and OT recommended SNF initially, have asked for reevaluation today- PT now recommending supervision/assistance 24hour, HHPT.  -TOC consulted, and patient will go home with family in [place]; home health cannot be arranged in [facility].  Diabetes mellitus, type II with neuropathy -hemoglobin A1c 8.7 -Continue NPH on discharge -Patient to follow-up with PCP to discuss better diabetes management  Urinary incontinence -Continue to treat pain -No dysuria  Essential hypertension -Continue lisinopril, amlodipine  GERD -Continue PPI  Depression/anxiety/bipolar 1 disorder -Continue Celexa, Cymbalta, Depakote  History of COPD -Stable, no shortness of breath or wheezing -Continue home medications  Orbit obesity -BMI 37 -Patient wanting to follow-up with primary care physician to discuss lifestyle modifications  Normocytic anemia -Stable  Procedures: None  Consultations: Neurology   Discharge Exam: Vitals:   08/05/19 0840 08/05/19 1122  BP:  138/85  Pulse:  Marland Kitchen)  59  Resp:  17  Temp:  98 F (36.7 C)    SpO2: 100% 100%     General: Well developed, well nourished, NAD, appears stated age  59: NCAT,mucous membranes moist.  Cardiovascular: S1 S2 auscultated, RRR  Respiratory: Clear to auscultation bilaterally with equal chest rise  Abdomen: Soft, obese, nontender, nondistended, + bowel sounds  Extremities: warm dry without cyanosis clubbing or edema  Neuro: AAOx3, nonfocal  Psych: Appropriate mood and affect  Discharge Instructions Discharge Instructions    Discharge instructions   Complete by: As directed    Patient will be discharged to home with family.  Patient will need to follow up with primary care provider within one week of discharge.  Patient should continue medications as prescribed.  Patient should follow a carb modified diet.     Allergies as of 08/05/2019      Reactions   Aspirin Nausea Only, Other (See Comments)   Causes stomach pain   Ibuprofen Other (See Comments)   Stomach problems      Medication List    STOP taking these medications   benzonatate 100 MG capsule Commonly known as: TESSALON   traMADol 50 MG tablet Commonly known as: ULTRAM     TAKE these medications   albuterol (2.5 MG/3ML) 0.083% nebulizer solution Commonly known as: PROVENTIL Take 2.5 mg by nebulization every 6 (six) hours as needed for shortness of breath.   albuterol 108 (90 Base) MCG/ACT inhaler Commonly known as: VENTOLIN HFA Inhale 2 puffs into the lungs every 4 (four) hours as needed.   amLODipine 5 MG tablet Commonly known as: NORVASC Take 5 mg by mouth daily.   citalopram 20 MG tablet Commonly known as: CELEXA Take 20 mg by mouth daily.   Depakote ER 500 MG 24 hr tablet Generic drug: divalproex Take 500-1,000 mg by mouth See admin instructions. 500MG  in the morning and 1000mg  at bedtime   diclofenac 75 MG EC tablet Commonly known as: VOLTAREN TAKE  (1)  TABLET TWICE A DAY.   DULoxetine 30 MG capsule Commonly known as: Cymbalta Take 1 capsule (30 mg  total) by mouth daily.   Fluticasone-Salmeterol 250-50 MCG/DOSE Aepb Commonly known as: Advair Diskus Inhale 1 puff into the lungs 2 (two) times daily.   gabapentin 300 MG capsule Commonly known as: NEURONTIN Take 3 capsules (900 mg total) by mouth 3 (three) times daily.   HYDROcodone-acetaminophen 5-325 MG tablet Commonly known as: NORCO/VICODIN Take one tab po q 4 hrs prn pain   insulin NPH-regular Human (70-30) 100 UNIT/ML injection Inject 50 Units into the skin 3 (three) times daily.   lisinopril 5 MG tablet Commonly known as: ZESTRIL Take 5 mg by mouth daily.   oxybutynin 5 MG tablet Commonly known as: DITROPAN Take 5 mg by mouth 2 (two) times daily.   pantoprazole 40 MG tablet Commonly known as: Protonix Take 1 tablet (40 mg total) by mouth daily.   tiotropium 18 MCG inhalation capsule Commonly known as: Spiriva HandiHaler Place 1 capsule (18 mcg total) into inhaler and inhale daily.   traZODone 100 MG tablet Commonly known as: DESYREL Take 200 mg by mouth at bedtime as needed for sleep.            Durable Medical Equipment  (From admission, onward)         Start     Ordered   08/05/19 1423  For home use only DME Walker rolling  Once    Question Answer Comment  Walker:  With 5 Inch Wheels   Patient needs a walker to treat with the following condition Weakness generalized      08/05/19 1422   08/05/19 1422  For home use only DME 3 n 1  Once     08/05/19 1422         Allergies  Allergen Reactions  . Aspirin Nausea Only and Other (See Comments)    Causes stomach pain  . Ibuprofen Other (See Comments)    Stomach problems   Follow-up Information    [facility]. Schedule an appointment as soon as possible for a visit in 1 week(s).   Why: Hospital follow up Contact information: [place] [address] [phone]            The results of significant diagnostics from this hospitalization (including imaging,  microbiology, ancillary and laboratory) are listed below for reference.    Significant Diagnostic Studies: CT HEAD WO CONTRAST  Result Date: 08/03/2019 CLINICAL DATA:  Vertigo. Dizziness and slurred speech. Weakness. EXAM: CT HEAD WITHOUT CONTRAST TECHNIQUE: Contiguous axial images were obtained from the base of the skull through the vertex without intravenous contrast. COMPARISON:  Head CT 01/17/2018 FINDINGS: Brain: No evidence of acute infarction, hemorrhage, hydrocephalus, extra-axial collection or mass lesion/mass effect. Brain volume is normal for age. Minimal chronic small vessel ischemia. Vascular: Atherosclerosis of skullbase vasculature without hyperdense vessel or abnormal calcification. Skull: No fracture or focal lesion. Sinuses/Orbits: Paranasal sinuses and mastoid air cells are clear. The visualized orbits are unremarkable. Other: None. IMPRESSION: No acute intracranial abnormality. Mild chronic small vessel ischemia, stable from prior. Electronically Signed   By: Keith Rake M.D.   On: 08/03/2019 01:08   MR BRAIN WO CONTRAST  Result Date: 08/03/2019 CLINICAL DATA:  Focal neuro deficit, greater than 6 hours, stroke suspect additional history provided: Patient reports still feeling dizzy, generally weak and with low energy level, TIA workup EXAM: MRI HEAD WITHOUT CONTRAST TECHNIQUE: Multiplanar, multiecho pulse sequences of the brain and surrounding structures were obtained without intravenous contrast. COMPARISON:  Head CT 08/03/2019 FINDINGS: Brain: The examination is significantly motion degraded. Most notably, there is moderate/severe motion degradation of the axial diffusion-weighted imaging, moderate/severe motion degradation of the sagittal T1 weighted sequence, moderate motion degradation of the axial T2 weighted sequence and axial T2 FLAIR sequence, moderate motion degradation of the axial SWI sequence, moderate/severe motion degradation of the axial T1 weighted sequence and  moderate/severe motion degradation of the coronal T2 weighted sequence. The coronal diffusion-weighted sequence is also mildly motion degraded. Within described limitations, there is no convincing evidence of acute infarct. No evidence of intracranial mass. No midline shift or extra-axial fluid collection identified. No definite chronic intracranial blood products identified. Moderate patchy T2/FLAIR hyperintensity within the cerebral white matter is nonspecific. Cerebral volume is normal for age. Vascular: Flow voids maintained within the proximal large arterial vessels. Skull and upper cervical spine: No focal marrow lesion identified. Sinuses/Orbits: No orbital abnormality identified. Mild ethmoid sinus mucosal thickening. No significant mastoid effusion. IMPRESSION: Significantly motion degraded examination as described. The axial diffusion-weighted sequence is moderate to severely motion degraded. The coronal diffusion-weighted sequence is mildly motion degraded. No evidence of acute infarct. No acute intracranial abnormality identified. Moderate patchy T2 hyperintensity within the cerebral white matter is nonspecific, but most commonly seen in the setting of chronic small vessel ischemic disease. Electronically Signed   By: Kellie Simmering DO   On: 08/03/2019 19:38   ECHOCARDIOGRAM COMPLETE  Result Date: 08/03/2019  ECHOCARDIOGRAM REPORT   Patient Name:   Rhonda Hickman Date of Exam: 08/03/2019 Medical Rec #:  [id number]      Height:       75.0 in Accession #:    [id number]     Weight:       298.9 lb Date of Birth:  1956-04-12      BSA:          2.60 m Patient Age:    73 years       BP:           127/75 mmHg Patient Gender: F              HR:           74 bpm. Exam Location:  [name] Procedure: 2D Echo, Cardiac Doppler and Color Doppler Indications:    TIA 435.9 / G45.9  History:        Patient has no prior history of Echocardiogram examinations.                 COPD; Risk Factors:Diabetes. Slurred  speech,GERD,Cocaine abuse.  Sonographer:    Alvino Chapel RCS Referring Phys: [id number] [place]  1. Left ventricular ejection fraction, by visual estimation, is 60 to 65%. The left ventricle has normal function. There is mildly increased left ventricular hypertrophy.  2. Left ventricular diastolic function could not be evaluated.  3. The left ventricle has no regional wall motion abnormalities.  4. Global right ventricle has normal systolic function.The right ventricular size is normal.  5. Left atrial size was normal.  6. Right atrial size was normal.  7. The mitral valve is normal in structure. Trivial mitral valve regurgitation. No evidence of mitral stenosis.  8. The tricuspid valve is normal in structure. Tricuspid valve regurgitation is trivial.  9. The aortic valve is tricuspid. Aortic valve regurgitation is not visualized. Mild aortic valve sclerosis without stenosis. 10. The pulmonic valve was not well visualized. Pulmonic valve regurgitation is not visualized. 11. The inferior vena cava is normal in size with greater than 50% respiratory variability, suggesting right atrial pressure of 3 mmHg. 12. Pt in atrial flutter at the time of the study; normal LV systolic function; mild LVH. FINDINGS  Left Ventricle: Left ventricular ejection fraction, by visual estimation, is 60 to 65%. The left ventricle has normal function. The left ventricle has no regional wall motion abnormalities. There is mildly increased left ventricular hypertrophy. Left ventricular diastolic function could not be evaluated. Normal left atrial pressure. Right Ventricle: The right ventricular size is normal.Global RV systolic function is has normal systolic function. The tricuspid regurgitant velocity is 2.43 m/s, and with an assumed right atrial pressure of 3 mmHg, the estimated right ventricular systolic pressure is normal at 26.6 mmHg. Left Atrium: Left atrial size was normal in size. Right Atrium: Right atrial size  was normal in size Pericardium: There is no evidence of pericardial effusion. Mitral Valve: The mitral valve is normal in structure. Trivial mitral valve regurgitation. No evidence of mitral valve stenosis by observation. Tricuspid Valve: The tricuspid valve is normal in structure. Tricuspid valve regurgitation is trivial. Aortic Valve: The aortic valve is tricuspid. Aortic valve regurgitation is not visualized. Mild aortic valve sclerosis is present, with no evidence of aortic valve stenosis. Pulmonic Valve: The pulmonic valve was not well visualized. Pulmonic valve regurgitation is not visualized. Pulmonic regurgitation is not visualized. Aorta: The aortic root is normal in size and structure. Venous: The inferior vena cava  is normal in size with greater than 50% respiratory variability, suggesting right atrial pressure of 3 mmHg.  Additional Comments: Pt in atrial flutter at the time of the study; normal LV systolic function; mild LVH.  LEFT VENTRICLE PLAX 2D LVIDd:         4.40 cm       Diastology LVIDs:         2.65 cm       LV e' lateral:   14.50 cm/s LV PW:         1.19 cm       LV E/e' lateral: 7.2 LV IVS:        1.19 cm       LV e' medial:    10.90 cm/s LVOT diam:     2.00 cm       LV E/e' medial:  9.6 LV SV:         62 ml LV SV Index:   22.71 LVOT Area:     3.14 cm  LV Volumes (MOD) LV area d, A2C:    28.10 cm LV area d, A4C:    34.50 cm LV area s, A2C:    12.00 cm LV area s, A4C:    16.20 cm LV major d, A2C:   8.03 cm LV major d, A4C:   8.29 cm LV major s, A2C:   5.88 cm LV major s, A4C:   6.69 cm LV vol d, MOD A2C: 83.1 ml LV vol d, MOD A4C: 118.0 ml LV vol s, MOD A2C: 21.2 ml LV vol s, MOD A4C: 32.6 ml LV SV MOD A2C:     61.9 ml LV SV MOD A4C:     118.0 ml LV SV MOD BP:      72.7 ml RIGHT VENTRICLE RV S prime:     13.80 cm/s TAPSE (M-mode): 2.5 cm LEFT ATRIUM             Index       RIGHT ATRIUM           Index LA diam:        3.90 cm 1.50 cm/m  RA Area:     17.50 cm LA Vol (A2C):   58.5 ml 22.47  ml/m RA Volume:   47.00 ml  18.05 ml/m LA Vol (A4C):   56.6 ml 21.74 ml/m LA Biplane Vol: 60.0 ml 23.05 ml/m  AORTIC VALVE LVOT Vmax:   177.50 cm/s LVOT Vmean:  104.000 cm/s LVOT VTI:    0.250 m  AORTA Ao Root diam: 3.40 cm MITRAL VALVE                         TRICUSPID VALVE MV Area (PHT): 5.66 cm              TR Peak grad:   23.6 mmHg MV PHT:        38.86 msec            TR Vmax:        243.00 cm/s MV Decel Time: 134 msec MV E velocity: 105.00 cm/s 103 cm/s  SHUNTS MV A velocity: 73.70 cm/s  70.3 cm/s Systemic VTI:  0.25 m MV E/A ratio:  1.42        1.5       Systemic Diam: 2.00 cm  Kirk Ruths MD Electronically signed by Kirk Ruths MD Signature Date/Time: 08/03/2019/3:14:37 PM    Final     Microbiology: Recent  Results (from the past 240 hour(s))  Respiratory Panel by RT PCR (Flu A&B, Covid) - Nasopharyngeal Swab     Status: None   Collection Time: 08/03/19  7:13 AM   Specimen: Nasopharyngeal Swab  Result Value Ref Range Status   SARS Coronavirus 2 by RT PCR NEGATIVE NEGATIVE Final    Comment: (NOTE) SARS-CoV-2 target nucleic acids are NOT DETECTED. The SARS-CoV-2 RNA is generally detectable in upper respiratoy specimens during the acute phase of infection. The lowest concentration of SARS-CoV-2 viral copies this assay can detect is 131 copies/mL. A negative result does not preclude SARS-Cov-2 infection and should not be used as the sole basis for treatment or other patient management decisions. A negative result may occur with  improper specimen collection/handling, submission of specimen other than nasopharyngeal swab, presence of viral mutation(s) within the areas targeted by this assay, and inadequate number of viral copies (<131 copies/mL). A negative result must be combined with clinical observations, patient history, and epidemiological information. The expected result is Negative. Fact Sheet for Patients:  PinkCheek.be Fact Sheet for  Healthcare Providers:  GravelBags.it This test is not yet ap proved or cleared by the [place] FDA and  has been authorized for detection and/or diagnosis of SARS-CoV-2 by FDA under an Emergency Use Authorization (EUA). This EUA will remain  in effect (meaning this test can be used) for the duration of the COVID-19 declaration under Section 564(b)(1) of the Act, 21 U.S.C. section 360bbb-3(b)(1), unless the authorization is terminated or revoked sooner.    Influenza A by PCR NEGATIVE NEGATIVE Final   Influenza B by PCR NEGATIVE NEGATIVE Final    Comment: (NOTE) The Xpert Xpress SARS-CoV-2/FLU/RSV assay is intended as an aid in  the diagnosis of influenza from Nasopharyngeal swab specimens and  should not be used as a sole basis for treatment. Nasal washings and  aspirates are unacceptable for Xpert Xpress SARS-CoV-2/FLU/RSV  testing. Fact Sheet for Patients: PinkCheek.be Fact Sheet for Healthcare Providers: GravelBags.it This test is not yet approved or cleared by the [place] FDA and  has been authorized for detection and/or diagnosis of SARS-CoV-2 by  FDA under an Emergency Use Authorization (EUA). This EUA will remain  in effect (meaning this test can be used) for the duration of the  Covid-19 declaration under Section 564(b)(1) of the Act, 21  U.S.C. section 360bbb-3(b)(1), unless the authorization is  terminated or revoked. Performed at [facility], [address], [place], [place] [address]   MRSA PCR Screening     Status: None   Collection Time: 08/03/19  4:03 PM   Specimen: Nasal Mucosa; Nasopharyngeal  Result Value Ref Range Status   MRSA by PCR NEGATIVE NEGATIVE Final    Comment:        The GeneXpert MRSA Assay (FDA approved for NASAL specimens only), is one component of a comprehensive MRSA colonization surveillance program. It is not intended to diagnose MRSA infection  nor to guide or monitor treatment for MRSA infections. Performed at [facility], [place] [address], [place], [place] [address]      Labs: Basic Metabolic Panel: Recent Labs  Lab 08/02/19 2335 08/04/19 0548 08/05/19 0439  NA 131* 139 139  K 3.5 4.4 4.4  CL 96* 105 103  CO2 24 24 24   GLUCOSE 301* 197* 170*  BUN 16 13 13   CREATININE 1.36* 1.02* 0.92  CALCIUM 8.4* 8.6* 8.7*   Liver Function Tests: No results for input(s): AST, ALT, ALKPHOS, BILITOT, PROT, ALBUMIN in  the last 168 hours. No results for input(s): LIPASE, AMYLASE in the last 168 hours. No results for input(s): AMMONIA in the last 168 hours. CBC: Recent Labs  Lab 08/02/19 2335 08/04/19 0548 08/05/19 0439  WBC 14.5* 7.4 9.0  NEUTROABS 7.6  --   --   HGB 11.8* 11.7* 10.9*  HCT 37.0 36.1 33.6*  MCV 87.5 87.2 86.2  PLT 277 271 286   Cardiac Enzymes: No results for input(s): CKTOTAL, CKMB, CKMBINDEX, TROPONINI in the last 168 hours. BNP: BNP (last 3 results) No results for input(s): BNP in the last 8760 hours.  ProBNP (last 3 results) No results for input(s): PROBNP in the last 8760 hours.  CBG: Recent Labs  Lab 08/04/19 1125 08/04/19 1520 08/04/19 2142 08/05/19 0742 08/05/19 1126  GLUCAP 179* 249* 233* 176* 155*       Signed:  Shayann Garbutt  [facility] 08/05/2019, 2:37 PM

## 2019-08-05 NOTE — TOC Transition Note (Signed)
Transition of Care [facility]) - CM/SW Discharge Note Marvetta Gibbons RN,BSN Transitions of Care Unit 4NP (non trauma) - RN Case Manager [phone]   Patient Details  Name: Rhonda Hickman MRN: [id number] Date of Birth: [birth year]  Transition [facility]) CM/SW Contact:  Dawayne Patricia, RN Phone Number: 08/05/2019, 2:35 PM   Clinical Narrative:    Pt stable for transition home with family, CM spoke with pt at bedside per pt she has a sister in [facility] that she plans to go stay with at discharge. Pt reports that she lives here alone, confirmed her primary care is [facility]. Pt is uninsured and per PT/OT either needs SNF or 24/7 supervision at this time. With no insurance pt would be difficult to place in SNF- and since pt is reporting that she can go stay with her sister/family in order to have 24/7 supervision and assistance- plan will be to transition pt home with family support. While CM was in the room- family member Rhonda Hickman called pt and states she can come provide transport home this evening around 5pm. Pt is agreeable to this plan- MD to order needed DME- RW and 3n1 for home- Pt states she will be staying with Rhonda Hickman in [facility]- since she will be in [facility] pt will not qualify for any charity [facility] services. Call made to [facility] with [place] for DME needs- RW and 3n1 to be delivered to room prior to discharge.   Final next level of care: Home/Self Care Barriers to Discharge: No Barriers Identified   Patient Goals and CMS Choice Patient states their goals for this hospitalization and ongoing recovery are:: to go home with family in [facility]   Choice offered to / list presented to : NA  Discharge Placement               Home with Family.         Discharge Plan and Services   Discharge Planning Services: CM Consult Post Acute Care Choice: Durable Medical Equipment          DME Arranged: 3-N-1, Walker rolling DME Agency: [employer] Date DME Agency Contacted: 08/05/19 Time DME  Agency Contacted: [phone] Representative spoke with at DME Agency: [place]: NA [place] Agency: NA        Social Determinants of Health ([place]) Interventions     Readmission Risk Interventions Readmission Risk Prevention Plan 08/05/2019  Transportation Screening Complete  PCP or Specialist Appt within 5-7 Days (No Data)  Home Care Screening Complete  Medication Review (RN CM) Complete  Some recent data might be hidden

## 2019-08-06 ENCOUNTER — Telehealth (HOSPITAL_COMMUNITY): Payer: Self-pay

## 2019-08-06 ENCOUNTER — Ambulatory Visit (HOSPITAL_COMMUNITY): Payer: 59 | Admitting: Occupational Therapy

## 2019-08-06 ENCOUNTER — Telehealth (HOSPITAL_COMMUNITY): Payer: Self-pay | Admitting: Occupational Therapy

## 2019-08-06 NOTE — Telephone Encounter (Signed)
pt wanted to cancel all of her appts due to she is going out of town with her family. Per pt she will call; back to reschedule.

## 2019-08-08 ENCOUNTER — Ambulatory Visit (HOSPITAL_COMMUNITY): Payer: 59 | Admitting: Occupational Therapy

## 2019-08-13 ENCOUNTER — Ambulatory Visit (HOSPITAL_COMMUNITY): Payer: 59

## 2019-08-15 ENCOUNTER — Encounter (HOSPITAL_COMMUNITY): Payer: Self-pay | Admitting: Occupational Therapy

## 2019-08-20 ENCOUNTER — Encounter (HOSPITAL_COMMUNITY): Payer: Self-pay | Admitting: Occupational Therapy

## 2019-08-21 ENCOUNTER — Ambulatory Visit (INDEPENDENT_AMBULATORY_CARE_PROVIDER_SITE_OTHER): Payer: 59 | Admitting: Orthopedic Surgery

## 2019-08-21 ENCOUNTER — Encounter: Payer: Self-pay | Admitting: Orthopedic Surgery

## 2019-08-21 ENCOUNTER — Other Ambulatory Visit: Payer: Self-pay

## 2019-08-21 VITALS — BP 125/77 | HR 71 | Ht 74.0 in | Wt 294.0 lb

## 2019-08-21 DIAGNOSIS — M1712 Unilateral primary osteoarthritis, left knee: Secondary | ICD-10-CM

## 2019-08-21 DIAGNOSIS — M79642 Pain in left hand: Secondary | ICD-10-CM | POA: Diagnosis not present

## 2019-08-21 DIAGNOSIS — Z716 Tobacco abuse counseling: Secondary | ICD-10-CM

## 2019-08-21 MED ORDER — DICLOFENAC SODIUM 75 MG PO TBEC: DELAYED_RELEASE_TABLET | ORAL | 0 refills | Status: DC

## 2019-08-21 MED ORDER — TRAMADOL HCL 50 MG PO TABS: 50.0000 mg | ORAL_TABLET | Freq: Four times a day (QID) | ORAL | 0 refills | Status: DC | PRN

## 2019-08-21 NOTE — Progress Notes (Signed)
No chief complaint on file.   HPI   64 year old female was seen back in December for pain in her left hand status post fall left wrist associated with swelling and decreased range of motion x-rays were negative.  She did not tolerate splinting but we did send her to occupational therapy and change her splint to a functional splint.  We advised her to continue her diclofenac and tramadol stop smoking and lose weight she comes in for 6-week follow-up diagnosis of wrist sprain.  The patient canceled her therapy visits although it does appear the made her a resting hand splint  BP 125/77   Pulse 71   Ht 6\' 2"  (1.88 m)   Wt 294 lb (133.4 kg)   BMI 37.75 kg/m   System review patient has had a stroke and this has affected her speech.  She was unable to complete her therapy as we said above secondary to the recent illnesses and her occupational therapist notes that she had to go out of town.  She still complains of pain and swelling decreased range of motion in the left hand  This does not appear to be related to the CVA  Past Medical History:  Diagnosis Date  . Anxiety   . Asthma   . Bipolar 1 disorder ([place])   . Bulging lumbar disc   . Chronic pain of left knee   . COPD (chronic obstructive pulmonary disease) ([place])   . CVA (cerebral vascular accident) ([place])   . Diabetes mellitus   . Neuropathy     The left hand is swollen the wrist is tender the first extensor compartment is nontender she has decreased flexion all digits  Neurovascular exam remains intact skin is normal  Weakness secondary to decreased overall range of motion in terms her grip strength no evidence of carpal tunnel  I reviewed her x-ray from December 21 there is no abnormalities of the scaphoid the wrist and hand do not show any acute abnormalities  Recommend resume physical therapy  Stop smoking  Follow-up in 3 months  Encounter Diagnoses  Name Primary?  . Pain in left hand Yes  . Encounter for tobacco  use cessation counseling   . Primary osteoarthritis of left knee

## 2019-08-21 NOTE — Patient Instructions (Addendum)
Resume OT   Health Risks of Smoking Smoking cigarettes is very bad for your health. Tobacco smoke has over 200 known poisons in it. It contains the poisonous gases nitrogen oxide and carbon monoxide. There are over 60 chemicals in tobacco smoke that cause cancer. Smoking is difficult to quit because a chemical in tobacco, called nicotine, causes addiction or dependence. When you smoke and inhale, nicotine is absorbed rapidly into the bloodstream through your lungs. Both inhaled and non-inhaled nicotine may be addictive. What are the risks of cigarette smoke? Cigarette smokers have an increased risk of many serious medical problems, including:  Lung cancer.  Lung disease, such as pneumonia, bronchitis, and emphysema.  Chest pain (angina) and heart attack because the heart is not getting enough oxygen.  Heart disease and peripheral blood vessel disease.  High blood pressure (hypertension).  Stroke.  Oral cancer, including cancer of the lip, mouth, or voice box.  Bladder cancer.  Pancreatic cancer.  Cervical cancer.  Pregnancy complications, including premature birth.  Stillbirths and smaller newborn babies, birth defects, and genetic damage to sperm.  Early menopause.  Lower estrogen level for women.  Infertility.  Facial wrinkles.  Blindness.  Increased risk of broken bones (fractures).  Senile dementia.  Stomach ulcers and internal bleeding.  Delayed wound healing and increased risk of complications during surgery.  Even smoking lightly shortens your life expectancy by several years. Because of secondhand smoke exposure, children of smokers have an increased risk of the following:  Sudden infant death syndrome (SIDS).  Respiratory infections.  Lung cancer.  Heart disease.  Ear infections. What are the benefits of quitting? There are many health benefits of quitting smoking. Here are some of them:  Within days of quitting smoking, your risk of having a  heart attack decreases, your blood flow improves, and your lung capacity improves. Blood pressure, pulse rate, and breathing patterns start returning to normal soon after quitting.  Within months, your lungs may clear up completely.  Quitting for 10 years reduces your risk of developing lung cancer and heart disease to almost that of a nonsmoker.  People who quit may see an improvement in their overall quality of life. How do I quit smoking?     Smoking is an addiction with both physical and psychological effects, and longtime habits can be hard to change. Your health care provider can recommend:  Programs and community resources, which may include group support, education, or talk therapy.  Prescription medicines to help reduce cravings.  Nicotine replacement products, such as patches, gum, and nasal sprays. Use these products only as directed. Do not replace cigarette smoking with electronic cigarettes, which are commonly called e-cigarettes. The safety of e-cigarettes is not known, and some may contain harmful chemicals.  A combination of two or more of these methods. Where to find more information  American Lung Association: www.lung.org  American Cancer Society: www.cancer.org Summary  Smoking cigarettes is very bad for your health. Cigarette smokers have an increased risk of many serious medical problems, including several cancers, heart disease, and stroke.  Smoking is an addiction with both physical and psychological effects, and longtime habits can be hard to change.  By stopping right away, you can greatly reduce the risk of medical problems for you and your family.  To help you quit smoking, your health care provider can recommend programs, community resources, prescription medicines, and nicotine replacement products such as patches, gum, and nasal sprays. This information is not intended to replace advice given to  you by your health care provider. Make sure you discuss  any questions you have with your health care provider. Document Revised: 09/28/2017 Document Reviewed: 07/01/2016 Elsevier Patient Education  2020 [name].

## 2019-08-22 ENCOUNTER — Encounter (HOSPITAL_COMMUNITY): Payer: Self-pay | Admitting: Occupational Therapy

## 2019-08-27 ENCOUNTER — Encounter (HOSPITAL_COMMUNITY): Payer: Self-pay

## 2019-08-29 ENCOUNTER — Encounter (HOSPITAL_COMMUNITY): Payer: Self-pay

## 2019-08-30 ENCOUNTER — Ambulatory Visit (HOSPITAL_COMMUNITY): Payer: 59

## 2019-09-03 ENCOUNTER — Encounter (HOSPITAL_COMMUNITY): Payer: Self-pay | Admitting: Occupational Therapy

## 2019-09-04 ENCOUNTER — Ambulatory Visit (HOSPITAL_COMMUNITY): Payer: 59 | Attending: Orthopedic Surgery

## 2019-09-04 ENCOUNTER — Ambulatory Visit: Payer: Self-pay | Admitting: Orthopedic Surgery

## 2019-09-04 ENCOUNTER — Encounter: Payer: Self-pay | Admitting: Orthopedic Surgery

## 2019-09-05 ENCOUNTER — Encounter (HOSPITAL_COMMUNITY): Payer: Self-pay

## 2019-09-09 ENCOUNTER — Other Ambulatory Visit: Payer: Self-pay

## 2019-09-09 ENCOUNTER — Emergency Department (HOSPITAL_COMMUNITY)
Admission: EM | Admit: 2019-09-09 | Discharge: 2019-09-09 | Disposition: A | Discharge status: Home / Self Care | Payer: 59 | Attending: Emergency Medicine | Admitting: Emergency Medicine

## 2019-09-09 ENCOUNTER — Emergency Department (HOSPITAL_COMMUNITY): Payer: 59

## 2019-09-09 DIAGNOSIS — Z20822 Contact with and (suspected) exposure to covid-19: Secondary | ICD-10-CM | POA: Diagnosis not present

## 2019-09-09 DIAGNOSIS — R531 Weakness: Secondary | ICD-10-CM | POA: Diagnosis not present

## 2019-09-09 DIAGNOSIS — F17210 Nicotine dependence, cigarettes, uncomplicated: Secondary | ICD-10-CM | POA: Diagnosis not present

## 2019-09-09 DIAGNOSIS — Z794 Long term (current) use of insulin: Secondary | ICD-10-CM | POA: Diagnosis not present

## 2019-09-09 DIAGNOSIS — E119 Type 2 diabetes mellitus without complications: Secondary | ICD-10-CM | POA: Insufficient documentation

## 2019-09-09 DIAGNOSIS — Z79899 Other long term (current) drug therapy: Secondary | ICD-10-CM | POA: Insufficient documentation

## 2019-09-09 DIAGNOSIS — J449 Chronic obstructive pulmonary disease, unspecified: Secondary | ICD-10-CM | POA: Insufficient documentation

## 2019-09-09 DIAGNOSIS — R519 Headache, unspecified: Secondary | ICD-10-CM

## 2019-09-09 LAB — CBC WITH DIFFERENTIAL/PLATELET
Abs Immature Granulocytes: 0.01 10*3/uL (ref 0.00–0.07)
Basophils Absolute: 0 10*3/uL (ref 0.0–0.1)
Basophils Relative: 1 %
Eosinophils Absolute: 0.1 10*3/uL (ref 0.0–0.5)
Eosinophils Relative: 2 %
HCT: 34.8 % — ABNORMAL LOW (ref 36.0–46.0)
Hemoglobin: 10.9 g/dL — ABNORMAL LOW (ref 12.0–15.0)
Immature Granulocytes: 0 %
Lymphocytes Relative: 54 %
Lymphs Abs: 3.4 10*3/uL (ref 0.7–4.0)
MCH: 27.9 pg (ref 26.0–34.0)
MCHC: 31.3 g/dL (ref 30.0–36.0)
MCV: 89.2 fL (ref 80.0–100.0)
Monocytes Absolute: 0.6 10*3/uL (ref 0.1–1.0)
Monocytes Relative: 9 %
Neutro Abs: 2.2 10*3/uL (ref 1.7–7.7)
Neutrophils Relative %: 34 %
Platelets: 215 10*3/uL (ref 150–400)
RBC: 3.9 MIL/uL (ref 3.87–5.11)
RDW: 15.9 % — ABNORMAL HIGH (ref 11.5–15.5)
WBC: 6.4 10*3/uL (ref 4.0–10.5)
nRBC: 0 % (ref 0.0–0.2)

## 2019-09-09 LAB — BASIC METABOLIC PANEL
Anion gap: 6 (ref 5–15)
BUN: 13 mg/dL (ref 8–23)
CO2: 27 mmol/L (ref 22–32)
Calcium: 8.7 mg/dL — ABNORMAL LOW (ref 8.9–10.3)
Chloride: 107 mmol/L (ref 98–111)
Creatinine, Ser: 0.89 mg/dL (ref 0.44–1.00)
GFR calc Af Amer: >60 mL/min (ref >60)
GFR calc non Af Amer: >60 mL/min (ref >60)
Glucose, Bld: 54 mg/dL — ABNORMAL LOW (ref 70–99)
Potassium: 4 mmol/L (ref 3.5–5.1)
Sodium: 140 mmol/L (ref 135–145)

## 2019-09-09 LAB — POC SARS CORONAVIRUS 2 AG -  ED: SARS Coronavirus 2 Ag: NEGATIVE

## 2019-09-09 MED ORDER — SODIUM CHLORIDE 0.9 % IV SOLN: INTRAVENOUS | Status: DC

## 2019-09-09 MED ORDER — PROCHLORPERAZINE EDISYLATE 10 MG/2ML IJ SOLN
10.0000 mg | Freq: Once | INTRAMUSCULAR | Status: AC
  Administered 2019-09-09: 13:00:00 10 mg via INTRAVENOUS
  Filled 2019-09-09: qty 2

## 2019-09-09 NOTE — Discharge Instructions (Addendum)
As discussed, your evaluation today has been largely reassuring.  But, it is important that you monitor your condition carefully, and do not hesitate to return to the ED if you develop new, or concerning changes in your condition. ? ?Otherwise, please follow-up with your physician for appropriate ongoing care. ? ?

## 2019-09-09 NOTE — ED Notes (Signed)
Pt states she feels much better. Is very drowsy and dozes off during conversation. Woke pt up to d/c and she then states she now has a big bill for nothing because we did nothing for her.

## 2019-09-09 NOTE — ED Triage Notes (Signed)
Pt c/o headache that started yesterday, accompanied by generalized weakness. Denies any unilateral weakness or deficits. Speech has been worsening since last month when she was seen here in ED.

## 2019-09-09 NOTE — ED Provider Notes (Signed)
[facility] EMERGENCY DEPARTMENT Provider Note   CSN: [id number] Arrival date & time: 09/09/19  1207     History Chief Complaint  Patient presents with  . Headache    Rhonda Hickman is a 64 y.o. female.  HPI   Patient presents with concern of headache and weakness.  Patient has history of CVA, other medical issues, but notes that she was in her usual state of health until possibly yesterday, possibly today.  She has gradually developed pain in the occiput, described as severe.  No other pain, though she does have generalized discomfort and global weakness.  She states that she has slowness of thought, though no new speech changes are described.  She does have baseline stutter. Is unclear if she is taking any medication for relief, but with worsening pain she presents for evaluation.  She denies a history of coronavirus, states that she has been tested multiple times.  She is also here 2 months ago, diagnosed with TIA.  Past Medical History:  Diagnosis Date  . Anxiety   . Asthma   . Bipolar 1 disorder ([place])   . Bulging lumbar disc   . Chronic pain of left knee   . COPD (chronic obstructive pulmonary disease) ([place])   . CVA (cerebral vascular accident) ([place])   . Diabetes mellitus   . Neuropathy     Patient Active Problem List   Diagnosis Date Noted  . Vertigo 08/03/2019  . Slurred speech 08/03/2019  . Leukocytosis 08/03/2019  . Bronchiectasis with acute exacerbation ([place])   . Obesity, Class III, BMI 40-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) ([place])   . Gastroesophageal reflux disease   . Asthma exacerbation 06/06/2018  . Gait abnormality 08/29/2017  . Urinary incontinence 08/29/2017  . Paresthesia 08/29/2017  . Derangement of posterior horn of medial meniscus of right knee   . Meniscus, lateral, derangement, right   . Arthritis of knee, right   . COPD with acute exacerbation ([place]) 03/09/2016  . Type 2 diabetes mellitus ([place]) 02/16/2007  . Cocaine abuse ([place]) 02/16/2007  . Extrinsic  asthma 02/16/2007  . HOMELESSNESS, HX OF 02/16/2007    Past Surgical History:  Procedure Laterality Date  . ABDOMINAL HYSTERECTOMY    . CARPAL TUNNEL RELEASE Right   . KNEE ARTHROSCOPY WITH MEDIAL MENISECTOMY Right 04/14/2016   Procedure: KNEE ARTHROSCOPY WITH MEDIAL AND LATERAL MENISECTOMY, MICROFRACTURE REPAIR;  Surgeon: Carole Civil, MD;  Location: [facility];  Service: Orthopedics;  Laterality: Right;  lateral menisectomy - needs crutch training     OB History    Gravida      Para      Term      Preterm      AB      Living  0     SAB      TAB      Ectopic      Multiple      Live Births              Family History  Problem Relation Age of Onset  . Diabetes Mother   . Heart attack Father     Social History   Tobacco Use  . Smoking status: Current Some Day Smoker    Packs/day: 0.25    Types: Cigarettes  . Smokeless tobacco: Never Used  . Tobacco comment: trying to cut down  Substance Use Topics  . Alcohol use: No  . Drug use: No    Home Medications Prior to Admission medications   Medication Sig  Start Date End Date Taking? Authorizing Provider  albuterol (PROVENTIL HFA;VENTOLIN HFA) 108 (90 Base) MCG/ACT inhaler Inhale 2 puffs into the lungs every 4 (four) hours as needed. 08/20/17   Rolland Porter, MD  albuterol (PROVENTIL) (2.5 MG/3ML) 0.083% nebulizer solution Take 2.5 mg by nebulization every 6 (six) hours as needed for shortness of breath.     [provider]  amLODipine (NORVASC) 5 MG tablet Take 5 mg by mouth daily.    [provider]  citalopram (CELEXA) 20 MG tablet Take 20 mg by mouth daily.    [provider]  diclofenac (VOLTAREN) 75 MG EC tablet TAKE  (1)  TABLET TWICE A DAY. 08/21/19   Carole Civil, MD  divalproex (DEPAKOTE ER) 500 MG 24 hr tablet Take 500-1,000 mg by mouth See admin instructions. 500MG  in the morning and 1000mg  at bedtime 03/26/18   [provider]  DULoxetine (CYMBALTA) 30 MG  capsule Take 1 capsule (30 mg total) by mouth daily. 09/12/18   Suzzanne Cloud, NP  Fluticasone-Salmeterol (ADVAIR DISKUS) 250-50 MCG/DOSE AEPB Inhale 1 puff into the lungs 2 (two) times daily. 06/07/18 06/07/19  Barton Dubois, MD  gabapentin (NEURONTIN) 300 MG capsule Take 3 capsules (900 mg total) by mouth 3 (three) times daily. 10/26/18   Sater, Nanine Means, MD  insulin NPH-regular (NOVOLIN 70/30) (70-30) 100 UNIT/ML injection Inject 50 Units into the skin 3 (three) times daily.     [provider]  lisinopril (PRINIVIL,ZESTRIL) 5 MG tablet Take 5 mg by mouth daily.    [provider]  oxybutynin (DITROPAN) 5 MG tablet Take 5 mg by mouth 2 (two) times daily.    [provider]  pantoprazole (PROTONIX) 40 MG tablet Take 1 tablet (40 mg total) by mouth daily. 06/07/18 06/07/19  Barton Dubois, MD  tiotropium (SPIRIVA HANDIHALER) 18 MCG inhalation capsule Place 1 capsule (18 mcg total) into inhaler and inhale daily. 06/07/18 06/07/19  Barton Dubois, MD  traMADol (ULTRAM) 50 MG tablet Take 1 tablet (50 mg total) by mouth every 6 (six) hours as needed. 08/21/19   Carole Civil, MD  traZODone (DESYREL) 100 MG tablet Take 200 mg by mouth at bedtime as needed for sleep.     [provider]    Allergies    Aspirin and Ibuprofen  Review of Systems   Review of Systems  Constitutional:       Per HPI, otherwise negative  HENT:       Per HPI, otherwise negative  Respiratory:       Per HPI, otherwise negative  Cardiovascular:       Per HPI, otherwise negative  Gastrointestinal: Negative for vomiting.  Endocrine:       Negative aside from HPI  Genitourinary:       Neg aside from HPI   Musculoskeletal:       Per HPI, otherwise negative  Skin: Negative.   Neurological: Positive for weakness and headaches. Negative for syncope.    Physical Exam Updated Vital Signs BP 129/70 (BP Location: Right Arm)   Pulse (!) 56   Temp 98 F (36.7 C) (Oral)   Resp 15    Ht 6' (1.829 m)   Wt 133.4 kg   SpO2 93%   BMI 39.87 kg/m   Physical Exam Vitals and nursing note reviewed.  Constitutional:      General: She is not in acute distress.    Appearance: She is well-developed. She is obese.  HENT:  Head: Normocephalic and atraumatic.  Eyes:     Conjunctiva/sclera: Conjunctivae normal.  Cardiovascular:     Rate and Rhythm: Normal rate and regular rhythm.  Pulmonary:     Effort: Pulmonary effort is normal. No respiratory distress.     Breath sounds: Normal breath sounds. No stridor.  Abdominal:     General: There is no distension.  Skin:    General: Skin is warm and dry.  Neurological:     Mental Status: She is alert and oriented to person, place, and time.     Comments: Stutter, mild generalized weakness, though the patient does move all extremities spontaneously and to command, no focal asymmetry.  Stutter  Psychiatric:        Behavior: Behavior is withdrawn.     ED Results / Procedures / Treatments   Labs (all labs ordered are listed, but only abnormal results are displayed) Labs Reviewed  CBC WITH DIFFERENTIAL/PLATELET - Abnormal; Notable for the following components:      Result Value   Hemoglobin 10.9 (*)    HCT 34.8 (*)    RDW 15.9 (*)    All other components within normal limits  BASIC METABOLIC PANEL - Abnormal; Notable for the following components:   Glucose, Bld 54 (*)    Calcium 8.7 (*)    All other components within normal limits  POC SARS CORONAVIRUS 2 AG -  ED    EKG None  Radiology CT HEAD WO CONTRAST  Result Date: 09/09/2019 CLINICAL DATA:  Headache. EXAM: CT HEAD WITHOUT CONTRAST TECHNIQUE: Contiguous axial images were obtained from the base of the skull through the vertex without intravenous contrast. COMPARISON:  August 03, 2019. FINDINGS: Brain: Minimal chronic ischemic white matter disease is noted. No mass effect or midline shift is noted. Ventricular size is within normal limits. There is no evidence of  mass lesion, hemorrhage or acute infarction. Vascular: No hyperdense vessel or unexpected calcification. Skull: Normal. Negative for fracture or focal lesion. Sinuses/Orbits: No acute finding. Other: None. IMPRESSION: Minimal chronic ischemic white matter disease. No acute intracranial abnormality seen. Electronically Signed   By: Marijo Conception M.D.   On: 09/09/2019 14:13    Procedures Procedures (including critical care time)  Medications Ordered in ED Medications  0.9 %  sodium chloride infusion ( Intravenous New Bag/Given 09/09/19 1311)  prochlorperazine (COMPAZINE) injection 10 mg (10 mg Intravenous Given 09/09/19 1311)    ED Course  I have reviewed the triage vital signs and the nursing notes.  Pertinent labs & imaging results that were available during my care of the patient were reviewed by me and considered in my medical decision making (see chart for details).    MDM Rules/Calculators/A&P                      2:34 PM Patient awake, alert, smiling.  She has been ambulatory, in no distress.  Covid test negative, labs reassuring, CT without notable findings.  Given her substantial improvement, absence of evidence for acute new intracranial pathology, bacteremia, sepsis, evidence for infection, and though she does have some mild weakness, there is no evidence for new focal neurologic deficiency, patient discharged in stable condition. Final Clinical Impression(s) / ED Diagnoses Final diagnoses:  Bad headache  Weakness      Carmin Muskrat, MD 09/09/19 1435

## 2019-09-13 ENCOUNTER — Other Ambulatory Visit: Payer: Self-pay

## 2019-09-13 ENCOUNTER — Ambulatory Visit (INDEPENDENT_AMBULATORY_CARE_PROVIDER_SITE_OTHER): Payer: 59 | Admitting: Primary Care

## 2019-09-13 ENCOUNTER — Encounter (INDEPENDENT_AMBULATORY_CARE_PROVIDER_SITE_OTHER): Payer: Self-pay | Admitting: Primary Care

## 2019-09-13 DIAGNOSIS — Z09 Encounter for follow-up examination after completed treatment for conditions other than malignant neoplasm: Secondary | ICD-10-CM | POA: Diagnosis not present

## 2019-09-13 DIAGNOSIS — G459 Transient cerebral ischemic attack, unspecified: Secondary | ICD-10-CM | POA: Diagnosis not present

## 2019-09-13 DIAGNOSIS — D509 Iron deficiency anemia, unspecified: Secondary | ICD-10-CM | POA: Diagnosis not present

## 2019-09-13 DIAGNOSIS — J449 Chronic obstructive pulmonary disease, unspecified: Secondary | ICD-10-CM

## 2019-09-13 DIAGNOSIS — F17210 Nicotine dependence, cigarettes, uncomplicated: Secondary | ICD-10-CM

## 2019-09-13 DIAGNOSIS — Z794 Long term (current) use of insulin: Secondary | ICD-10-CM

## 2019-09-13 DIAGNOSIS — E1100 Type 2 diabetes mellitus with hyperosmolarity without nonketotic hyperglycemic-hyperosmolar coma (NKHHC): Secondary | ICD-10-CM

## 2019-09-13 DIAGNOSIS — Z7689 Persons encountering health services in other specified circumstances: Secondary | ICD-10-CM

## 2019-09-13 DIAGNOSIS — N3942 Incontinence without sensory awareness: Secondary | ICD-10-CM

## 2019-09-13 NOTE — Progress Notes (Signed)
Virtual Visit via Telephone Note  I connected with Rhonda Hickman on 09/13/19 at  9:30 AM EST by telephone and verified that I am speaking with the correct person using two identifiers.   I discussed the limitations, risks, security and privacy concerns of performing an evaluation and management service by telephone and the availability of in person appointments. I also discussed with the patient that there may be a patient responsible charge related to this service. The patient expressed understanding and agreed to proceed.   History of Present Illness: Rhonda Hickman is establish care and hospital follow up. She presents to the emergency room on 08/01/19 for dizziness that  become so severe that she has not been able to get out of bed and noticed slurred speech . Because of this she was unable to take medications for the last 3 days.  CT head does not show any acute findings. She was admitted for observation. She will need follow up with neurology, speech therapy and home health nursing. The reason for her opting out of SNF was she knew she could not afford it and needed $1500 to take her in a facility. Presented to emergency room on 09/09/2019 for bad headaches no changes from initial TIA- slurred speech, stuttering and unstable gait. Past Medical History:  Diagnosis Date  . Anxiety   . Asthma   . Bipolar 1 disorder ([place])   . Bulging lumbar disc   . Chronic pain of left knee   . COPD (chronic obstructive pulmonary disease) ([place])   . CVA (cerebral vascular accident) ([place])   . Diabetes mellitus   . Neuropathy    Current Outpatient Medications on File Prior to Visit  Medication Sig Dispense Refill  . albuterol (PROVENTIL HFA;VENTOLIN HFA) 108 (90 Base) MCG/ACT inhaler Inhale 2 puffs into the lungs every 4 (four) hours as needed. 6.7 g 0  . albuterol (PROVENTIL) (2.5 MG/3ML) 0.083% nebulizer solution Take 2.5 mg by nebulization every 6 (six) hours as needed for shortness of breath.     Marland Kitchen  amLODipine (NORVASC) 5 MG tablet Take 5 mg by mouth daily.    . diclofenac (VOLTAREN) 75 MG EC tablet TAKE  (1)  TABLET TWICE A DAY. 60 tablet 0  . divalproex (DEPAKOTE ER) 500 MG 24 hr tablet Take 500-1,000 mg by mouth See admin instructions. 500MG  in the morning and 1000mg  at bedtime    . gabapentin (NEURONTIN) 300 MG capsule Take 3 capsules (900 mg total) by mouth 3 (three) times daily. 131 capsule 0  . insulin NPH-regular (NOVOLIN 70/30) (70-30) 100 UNIT/ML injection Inject 50 Units into the skin 3 (three) times daily.     Marland Kitchen lisinopril (PRINIVIL,ZESTRIL) 5 MG tablet Take 5 mg by mouth daily.    Marland Kitchen oxybutynin (DITROPAN) 5 MG tablet Take 5 mg by mouth 2 (two) times daily.    . traMADol (ULTRAM) 50 MG tablet Take 1 tablet (50 mg total) by mouth every 6 (six) hours as needed. 30 tablet 0  . DULoxetine (CYMBALTA) 30 MG capsule Take 1 capsule (30 mg total) by mouth daily. (Patient not taking: Reported on 09/13/2019) 30 capsule 3  . Fluticasone-Salmeterol (ADVAIR DISKUS) 250-50 MCG/DOSE AEPB Inhale 1 puff into the lungs 2 (two) times daily. 60 each 3  . pantoprazole (PROTONIX) 40 MG tablet Take 1 tablet (40 mg total) by mouth daily. 30 tablet 1  . tiotropium (SPIRIVA HANDIHALER) 18 MCG inhalation capsule Place 1 capsule (18 mcg total) into inhaler and inhale daily. 30 capsule 3  .  traZODone (DESYREL) 100 MG tablet Take 200 mg by mouth at bedtime as needed for sleep.      No current facility-administered medications on file prior to visit.   Observations/Objective: Review of Systems  Genitourinary:       Urinary  incontence  Musculoskeletal: Positive for back pain and neck pain.       Knee bilateral  Neurological: Positive for speech change, focal weakness, weakness and headaches.  Psychiatric/Behavioral: Positive for depression.  All other systems reviewed and are negative.   Assessment and Plan: Ivylynn was seen today for hospitalization follow-up.  Diagnoses and all orders for this  visit:  Obesity, Class III, BMI 40-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) (HCC) Morbid Obesity is >40 BMI indicating an excess in caloric intake or underlining conditions. This may lead to other co-morbidities COPD exacerbation, uncontrolled DM, HTN   Lifestyle modifications of diet and exercise is difficult at this time due to pain in neck, back and knee.   Encounter to establish care [name], NP-C will be your  (PCP) she is mastered prepared . She is skilled to diagnosed and treat illness. Also able to answer health concern as well as continuing care of varied medical conditions, not limited by cause, organ system, or diagnosis.   Urinary incontinence without sensory awareness This maybe contributed reactive endplate changes at [id number]-GRM. Disc desiccation at L4-5. L4-L5: Mild broad-based disc bulge. Severe bilateral facet arthropathy. Mild bilateral foraminal stenosis. L5-S1: Broad-based disc bulge with a small central disc protrusion. Mild bilateral facet arthropathy. Mild bilateral foraminal stenosis. Uses depends .  COPD mixed type ([place])  COPD affects the lungs and causes reduced airflow, which makes it hard to breathe. It is also progressive, which means it worsens over time. Continues to smoke cigarettes   Iron deficiency anemia, unspecified iron deficiency anemia type Reviewed hospital discharge records low H/H and RDW elevated normal MCV/MCH sent in iron supplement may cause constipation- OTC laxative  TIA (transient ischemic attack) Refer to neurology continues to have slurred speech, sutter and unstable gait with falls and right hand tremors   Type 2 diabetes mellitus with 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, with long-term current use of insulin (HCC) Therapeutic goals for glycemic control related to A1c less than 6.5 hemoglobin A1c. A1C 8.7  Decrease foods that are high in carbohydrates are the following rice, potatoes, breads, sugars, and pastas.  Reduction in the intake (eating) will assist in  lowering your blood sugars.Changed medication Lantus 20units BID added metformin 1000mg  BID   Other orders -     insulin glargine (LANTUS SOLOSTAR) 100 UNIT/ML Solostar Pen; Inject 20 Units into the skin daily. -     metFORMIN (GLUCOPHAGE) 1000 MG tablet; Take 1 tablet (1,000 mg total) by mouth 2 (two) times daily with a meal. -     lisinopril (ZESTRIL) 20 MG tablet; Take 1 tablet (20 mg total) by mouth daily. -     amLODipine (NORVASC) 10 MG tablet; Take 1 tablet (10 mg total) by mouth daily. -     budesonide-formoterol (SYMBICORT) 160-4.5 MCG/ACT inhaler; Inhale 2 puffs into the lungs 2 (two) times daily. -     albuterol (VENTOLIN HFA) 108 (90 Base) MCG/ACT inhaler; Inhale 2 puffs into the lungs every 6 (six) hours as needed for wheezing or shortness of breath. -     ferrous sulfate 324 MG TBEC; Take 1 tablet (324 mg total) by mouth daily with breakfast.    Follow Up Instructions:    I discussed the assessment and treatment plan  with the patient. The patient was provided an opportunity to ask questions and all were answered. The patient agreed with the plan and demonstrated an understanding of the instructions.   The patient was advised to call back or seek an in-person evaluation if the symptoms worsen or if the condition fails to improve as anticipated.  I provided 40 minutes of non-face-to-face time during this encounter. Includes chart review, imaging, labs    [name], NP

## 2019-09-15 MED ORDER — METFORMIN HCL 1000 MG PO TABS: 1000.0000 mg | ORAL_TABLET | Freq: Two times a day (BID) | ORAL | 3 refills | Status: DC

## 2019-09-15 MED ORDER — AMLODIPINE BESYLATE 10 MG PO TABS: 10.0000 mg | ORAL_TABLET | Freq: Every day | ORAL | 3 refills | Status: DC

## 2019-09-15 MED ORDER — FERROUS SULFATE 324 MG PO TBEC: 324.0000 mg | DELAYED_RELEASE_TABLET | Freq: Every day | ORAL | 1 refills | Status: DC

## 2019-09-15 MED ORDER — BUDESONIDE-FORMOTEROL FUMARATE 160-4.5 MCG/ACT IN AERO: 2.0000 | INHALATION_SPRAY | Freq: Two times a day (BID) | RESPIRATORY_TRACT | 3 refills | Status: DC

## 2019-09-15 MED ORDER — LANTUS SOLOSTAR 100 UNIT/ML ~~LOC~~ SOPN: 20.0000 [IU] | PEN_INJECTOR | Freq: Every day | SUBCUTANEOUS | 3 refills | Status: DC

## 2019-09-15 MED ORDER — ALBUTEROL SULFATE HFA 108 (90 BASE) MCG/ACT IN AERS: 2.0000 | INHALATION_SPRAY | Freq: Four times a day (QID) | RESPIRATORY_TRACT | 1 refills | Status: DC | PRN

## 2019-09-15 MED ORDER — LISINOPRIL 20 MG PO TABS: 20.0000 mg | ORAL_TABLET | Freq: Every day | ORAL | 3 refills | Status: DC

## 2019-09-24 ENCOUNTER — Telehealth (INDEPENDENT_AMBULATORY_CARE_PROVIDER_SITE_OTHER): Payer: Self-pay

## 2019-09-24 NOTE — Telephone Encounter (Signed)
Sent to PCP ?

## 2019-09-24 NOTE — Telephone Encounter (Signed)
Patient called wanting a call back PCP concerning her medication. Patient states insulin glargine (LANTUS SOLOSTAR) 100 UNIT/ML Solostar Pen  Is costing $100 after her insurance and it is to expensive for her to pay out of pocket. Patient wants to know if she can use her humulin 70/30 since she has some medication left.  Patient states she is taking metFORMIN put it causes her to throw up. Patient states the pills get stuck in her throat and that is what causes her to throw up. Patient also has concerns about other medications.  Please advice [phone]

## 2019-09-25 NOTE — Telephone Encounter (Signed)
Spoke with patient she will start 10 units twice a day of 70/30 and follow up with CP.Lurena Joiner)

## 2019-10-01 ENCOUNTER — Ambulatory Visit (HOSPITAL_COMMUNITY): Payer: 59 | Attending: Orthopedic Surgery

## 2019-10-01 ENCOUNTER — Encounter (HOSPITAL_COMMUNITY): Payer: Self-pay

## 2019-10-01 ENCOUNTER — Other Ambulatory Visit: Payer: Self-pay

## 2019-10-01 DIAGNOSIS — R29898 Other symptoms and signs involving the musculoskeletal system: Secondary | ICD-10-CM | POA: Insufficient documentation

## 2019-10-01 DIAGNOSIS — R278 Other lack of coordination: Secondary | ICD-10-CM | POA: Insufficient documentation

## 2019-10-01 DIAGNOSIS — M25532 Pain in left wrist: Secondary | ICD-10-CM | POA: Diagnosis present

## 2019-10-01 DIAGNOSIS — M79642 Pain in left hand: Secondary | ICD-10-CM | POA: Insufficient documentation

## 2019-10-01 DIAGNOSIS — M25632 Stiffness of left wrist, not elsewhere classified: Secondary | ICD-10-CM | POA: Insufficient documentation

## 2019-10-01 DIAGNOSIS — M25642 Stiffness of left hand, not elsewhere classified: Secondary | ICD-10-CM | POA: Insufficient documentation

## 2019-10-01 NOTE — Patient Instructions (Signed)
Home Exercises Program Theraputty Exercises  Do the following exercises 2-3 times a day using your affected hand.  1. Roll putty into a ball.  2. Make into a pancake.  3. Roll putty into a roll.  4. Pinch along log with first finger and thumb.   5. Make into a ball.  6. Roll it back into a log.   7. Pinch using thumb and side of first finger.  8. Roll into a ball, then flatten into a pancake.  9. Using your fingers, make putty into a mountain.  10. Make putty into a ball and squeeze and release 10-12 times.     WRIST FLEXOR STRETCH  Use your unaffected hand to bend the affected wrist up as shown.   Keep the elbow straight on the affected side the entire time.   Hold for 30 seconds. 2 times.   WRIST EXTENSOR STRETCH  Use your unaffected hand to bend the affected wrist down as shown.   Keep the elbow straight on the affected side the entire time.   Hold for 30 seconds. Complete 2 times.     PRAYER STRETCH - WRIST  Place the palms of your hands together with your fingers pointed upwards. Then lower your hands in front of your chest as shown to stretch your wrists. Hold for 30 seconds. Complete 2 times.

## 2019-10-02 NOTE — Therapy (Signed)
[place], [place], [address] Phone: [phone]   Fax:  [phone]  Occupational Therapy Treatment Reassessment/re-cert Patient Details  Name: Rhonda Hickman MRN: [id number] Date of Birth: [birth year] Referring Provider (OT): Dr. Arther Abbott   Encounter Date: 10/01/2019  OT End of Session - 10/01/19 1848    Visit Number  2    Number of Visits  6    Date for OT Re-Evaluation  11/12/19    Authorization Type  [employer] 35 visit limit 0 used    Authorization Time Period  effective 07/12/19. Autthroization required    Authorization - Visit Number  1    Authorization - Number of Visits  35    OT Start Time  1600    OT Stop Time  1640    OT Time Calculation (min)  40 min    Activity Tolerance  Patient tolerated treatment well    Behavior During Therapy  WFL for tasks assessed/performed   sleepy. Several times had eyes closed towards end of evaluation. yawning continuously.      Past Medical History:  Diagnosis Date  . Anxiety   . Asthma   . Bipolar 1 disorder ([place])   . Bulging lumbar disc   . Chronic pain of left knee   . COPD (chronic obstructive pulmonary disease) ([place])   . CVA (cerebral vascular accident) ([place])   . Diabetes mellitus   . Neuropathy     Past Surgical History:  Procedure Laterality Date  . ABDOMINAL HYSTERECTOMY    . CARPAL TUNNEL RELEASE Right   . KNEE ARTHROSCOPY WITH MEDIAL MENISECTOMY Right 04/14/2016   Procedure: KNEE ARTHROSCOPY WITH MEDIAL AND LATERAL MENISECTOMY, MICROFRACTURE REPAIR;  Surgeon: Carole Civil, MD;  Location: [facility];  Service: Orthopedics;  Laterality: Right;  lateral menisectomy - needs crutch training    There were no vitals filed for this visit.  Subjective Assessment - 10/02/19 0928    Subjective   S: i've been trying to stretch it like you showed me.    Currently in Pain?  No/denies   pain expressed at night and when trying to move and use wrist         [facility] OT Assessment - 10/01/19 1609      Assessment   Medical Diagnosis  Left Wrist Pain    Referring Provider (OT)  Dr. Arther Abbott    Onset Date/Surgical Date  --   Dec. 2020   Hand Dominance  Right    Next MD Visit  11/18/19    Prior Therapy  Pt received one OT visit to fabricate splint and 1 evaluation for OT treatment before stopping.       Precautions   Precautions  None      Restrictions   Weight Bearing Restrictions  No      Balance Screen   Has the patient fallen in the past 6 months  Yes    How many times?  1    Has the patient had a decrease in activity level because of a fear of falling?   No    Is the patient reluctant to leave their home because of a fear of falling?   No      Home  Environment   Family/patient expects to be discharged to:  Private residence    Living Arrangements  Alone      Prior Function   Level of Independence  Independent    Vocation  Unemployed  ADL   ADL comments  Pt reports decreased sensation and numbness which causes her to foroget if she's holding items in it. Decreased use. Pt reports that she is not using her Left hand for any daily tasks and typically uses her right hand.       Written Expression   Dominant Hand  Right      Vision - History   Baseline Vision  No visual deficits      Cognition   Overall Cognitive Status  Within Functional Limits for tasks assessed      Sensation   Light Touch  Appears Intact    Stereognosis  Impaired by gross assessment    Hot/Cold  Appears Intact    Additional Comments  To be assessed next session      Coordination   Gross Motor Movements are Fluid and Coordinated  No    Fine Motor Movements are Fluid and Coordinated  No    9 Hole Peg Test  Right;Left    Right 9 Hole Peg Test  26.9"    Left 9 Hole Peg Test  30.1"   previous: 40.9"     Edema   Edema  left wrist crease: 19.0 cm Left hand MCP joints: 22.0 cm    edema measurements are the same as previous  evaluation     ROM / Strength   AROM / PROM / Strength  AROM;Strength;PROM      AROM   AROM Assessment Site  Wrist    Right/Left Wrist  Left    Left Wrist Extension  48 Degrees   previous: 50   Left Wrist Flexion  62 Degrees   previous: 62   Left Wrist Radial Deviation  20 Degrees   previous: 30   Left Wrist Ulnar Deviation  32 Degrees   previous: 22   Right/Left Finger  Left    Left Composite Finger Extension  --   100% Previous: 75%   Left Composite Finger Flexion  50%   preivious: 25%   Right/Left Thumb  Left    Left Thumb Opposition  Digit 5   previous: digit 4     PROM   PROM Assessment Site  Wrist    Right/Left Wrist  Left    Left Wrist Extension  64 Degrees   previous: 66   Left Wrist Flexion  62 Degrees   previous: 74   Left Wrist Radial Deviation  30 Degrees   previous: 30   Left Wrist Ulnar Deviation  42 Degrees   previous: 30     Strength   Strength Assessment Site  Hand;Wrist    Right/Left Wrist  Left    Left Wrist Flexion  5/5    Left Wrist Extension  5/5    Left Wrist Radial Deviation  5/5    Left Wrist Ulnar Deviation  5/5    Right Hand Grip (lbs)  80    Right Hand Lateral Pinch  20 lbs    Right Hand 3 Point Pinch  14 lbs    Left Hand Grip (lbs)  25   previous: 0   Left Hand Lateral Pinch  17 lbs   previous: 12   Left Hand 3 Point Pinch  8 lbs   previous: 3                      OT Education - 10/02/19 0936    Education Details  discussed progress since initial evaluation. Updated HEP: yellow theraputty (  grip and pinch), wrist stretches. Discussed goals for therapy.    Person(s) Educated  Patient    Methods  Explanation;Demonstration;Handout;Verbal cues    Comprehension  Verbalized understanding       OT Short Term Goals - 10/02/19 1110      OT SHORT TERM GOAL #1   Title  Patient will be educated and independent with HEP in order to faciliate her progress in therapy and allow her to return to using her left hand as her  nondominant extremity for 50% of her daily and work tasks.    Time  6    Period  Weeks    Status  On-going    Target Date  11/12/19      OT SHORT TERM GOAL #2   Title  Patient will increase her A/ROM of her left hand and wrist to [facility] in order to form a complete fist while holding onto small-medium size objects such as her cell phone on a daily basis.    Time  6    Period  Weeks    Status  On-going      OT SHORT TERM GOAL #3   Title  Patient will increase her left hand coordination by completing the 9 hole peg test in 35 seconds or less.    Time  5    Period  Weeks    Status  Achieved      OT SHORT TERM GOAL #4   Title  Patient will increase her left grip strength by 10# more (35#) and her 3 point pinch strength 2 more lbs. (10#) in order to grasp and hold onto normal household items without droppping.    Baseline  3/24: Patient has met her goal for pinch and grip strength. Goal is upgraded to work on achieving average norms for grip and 3 point pinch.    Time  5    Period  Weeks    Status  Revised      OT SHORT TERM GOAL #5   Title  Pt will report a decrease in pain of approximately 5/10 or less when completing daily and work related tasks.    Time  5    Period  Weeks    Status  On-going      Additional Short Term Goals   Additional Short Term Goals  Yes      OT SHORT TERM GOAL #6   Title  Patient will be educated and voice understanding of techniques and/or strategies to complete which may assist with decreasing edema and increase sensation in left hand which will aid with overall functional use during daily tasks.    Time  6    Period  Weeks    Status  New    Target Date  11/12/19               Plan - 10/02/19 1047    Clinical Impression Statement  A: Patient is returning to clinic after 2 months with initial evaluation. She demonstrates slight improvement with majority of areas assessed such as strength, ROM, and coordination. HEP was updated this date and  patient verbalized understanding. Patient continues to demonstrate deficits with ROM, strength, edema, sensation, and coordination resulting in limited to no use of her left hand during daily tasks.Since initial evaluation, patient has met 2 therapy goals. Grip and pinch goal have been upgraded. 1 additional goal has been made to focus on sensation and edema management.    Body Structure / Function /  Physical Skills  ADL;ROM;IADL;Strength;Coordination;FMC;GMC;Pain;UE functional use;Flexibility;Fascial restriction;Decreased knowledge of use of DME    OT Frequency  1x / week    OT Duration  6 weeks    Plan  P: Continue to work on mentioned deficits. Complete DASH. Trial flexion glove. Assess sensation and provide education on safety awareness and handout for techniques for sensory re-education.    Consulted and Agree with Plan of Care  Patient       Patient will benefit from skilled therapeutic intervention in order to improve the following deficits and impairments:   Body Structure / Function / Physical Skills: ADL, ROM, IADL, Strength, Coordination, FMC, GMC, Pain, UE functional use, Flexibility, Fascial restriction, Decreased knowledge of use of DME       Visit Diagnosis: Pain in left wrist - Plan: Ot plan of care cert/re-cert  Other symptoms and signs involving the musculoskeletal system - Plan: Ot plan of care cert/re-cert  Stiffness of left wrist, not elsewhere classified - Plan: Ot plan of care cert/re-cert  Stiffness of left hand, not elsewhere classified - Plan: Ot plan of care cert/re-cert  Pain in left hand - Plan: Ot plan of care cert/re-cert  Other lack of coordination - Plan: Ot plan of care cert/re-cert    Problem List Patient Active Problem List   Diagnosis Date Noted  . Vertigo 08/03/2019  . Slurred speech 08/03/2019  . Leukocytosis 08/03/2019  . Bronchiectasis with acute exacerbation ([place])   . Obesity, Class III, BMI 40-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) ([place])   .  Gastroesophageal reflux disease   . Asthma exacerbation 06/06/2018  . Gait abnormality 08/29/2017  . Urinary incontinence 08/29/2017  . Paresthesia 08/29/2017  . Derangement of posterior horn of medial meniscus of right knee   . Meniscus, lateral, derangement, right   . Arthritis of knee, right   . COPD with acute exacerbation ([place]) 03/09/2016  . Type 2 diabetes mellitus ([place]) 02/16/2007  . Cocaine abuse ([place]) 02/16/2007  . Extrinsic asthma 02/16/2007  . HOMELESSNESS, HX OF 02/16/2007   Ailene Ravel, OTR/L,CBIS  [phone]   10/02/2019, 12:41 PM  [facility] [address] [place], [place], [address] Phone: [phone]   Fax:  [phone]  Name: Rhonda Hickman MRN: [id number] Date of Birth: September 05, 1955

## 2019-10-07 ENCOUNTER — Other Ambulatory Visit: Payer: Self-pay

## 2019-10-07 DIAGNOSIS — M1712 Unilateral primary osteoarthritis, left knee: Secondary | ICD-10-CM

## 2019-10-07 MED ORDER — DICLOFENAC SODIUM 75 MG PO TBEC: DELAYED_RELEASE_TABLET | ORAL | 2 refills | Status: DC

## 2019-10-07 NOTE — Telephone Encounter (Signed)
Diclofenac 75 mg EC Tablet  Qty 60 Tablets  Take (1) tablet twice a day   PATIENT USES [facility]

## 2019-10-08 ENCOUNTER — Ambulatory Visit (HOSPITAL_COMMUNITY): Payer: 59 | Admitting: Occupational Therapy

## 2019-10-08 ENCOUNTER — Telehealth (HOSPITAL_COMMUNITY): Payer: Self-pay | Admitting: Occupational Therapy

## 2019-10-08 NOTE — Telephone Encounter (Signed)
PT CALLED TO CX TODAY'S APPT DUE TO SHE STATED SHE FORGOT ABOUT HER OTHER DOCTOR'S APPT.

## 2019-10-15 ENCOUNTER — Telehealth (HOSPITAL_COMMUNITY): Payer: Self-pay | Admitting: Occupational Therapy

## 2019-10-15 ENCOUNTER — Encounter (HOSPITAL_COMMUNITY): Payer: Self-pay

## 2019-10-15 ENCOUNTER — Ambulatory Visit (HOSPITAL_COMMUNITY): Payer: 59 | Admitting: Occupational Therapy

## 2019-10-15 NOTE — Telephone Encounter (Signed)
pt cancelled appt for today because she does not have anyone to bring her

## 2019-10-16 ENCOUNTER — Other Ambulatory Visit: Payer: Self-pay | Admitting: Neurology

## 2019-10-16 NOTE — Telephone Encounter (Signed)
1) Medication(s) Requested (by name): gabapentin 2) Pharmacy of Choice:   [facility] in [place]  [phone]

## 2019-10-17 MED ORDER — GABAPENTIN 300 MG PO CAPS: 900.0000 mg | ORAL_CAPSULE | Freq: Three times a day (TID) | ORAL | 0 refills | Status: DC

## 2019-10-17 NOTE — Telephone Encounter (Signed)
Will refill this from prior, is seeing Dr. Krista Blue soon, can determine if rx needs to be continued

## 2019-10-29 ENCOUNTER — Ambulatory Visit (INDEPENDENT_AMBULATORY_CARE_PROVIDER_SITE_OTHER): Payer: 59 | Admitting: Primary Care

## 2019-10-31 ENCOUNTER — Telehealth (HOSPITAL_COMMUNITY): Payer: Self-pay

## 2019-10-31 ENCOUNTER — Ambulatory Visit (HOSPITAL_COMMUNITY): Payer: 59

## 2019-10-31 NOTE — Telephone Encounter (Signed)
pt called to cx due to no transportation 

## 2019-11-04 ENCOUNTER — Ambulatory Visit (INDEPENDENT_AMBULATORY_CARE_PROVIDER_SITE_OTHER): Payer: 59 | Admitting: Primary Care

## 2019-11-05 ENCOUNTER — Institutional Professional Consult (permissible substitution): Payer: 59 | Admitting: Neurology

## 2019-11-15 ENCOUNTER — Telehealth: Payer: Self-pay | Admitting: Orthopedic Surgery

## 2019-11-15 NOTE — Telephone Encounter (Signed)
Patient call to relay information; states must cancel her appointment due to just being discharged from hospital in [place], her primary residence. States had heart/vascular issues and blood infection; said will call back to reschedule when she is able to.

## 2019-11-18 ENCOUNTER — Telehealth (INDEPENDENT_AMBULATORY_CARE_PROVIDER_SITE_OTHER): Payer: Self-pay

## 2019-11-18 ENCOUNTER — Ambulatory Visit: Payer: 59 | Admitting: Orthopedic Surgery

## 2019-11-18 NOTE — Telephone Encounter (Signed)
Will forward the message to the provider. RN unable to prescribe medication.  Patient may need OV/ Tele visit to discuss with PCP.

## 2019-11-18 NOTE — Telephone Encounter (Signed)
Rhonda Hickman from [facility] called and requested to speak with the nurse stating that patient was hospitalized in [place] and was prescribed Xarelto but medicaid is not covering RX. He would like a call back from nurse to see what other medication can be sent. Patient has an appointment schedule for May 20.  Please advice [phone] (pharmacy)

## 2019-11-21 ENCOUNTER — Inpatient Hospital Stay (INDEPENDENT_AMBULATORY_CARE_PROVIDER_SITE_OTHER): Payer: 59 | Admitting: Primary Care

## 2019-11-21 DIAGNOSIS — R531 Weakness: Secondary | ICD-10-CM | POA: Diagnosis not present

## 2019-11-21 DIAGNOSIS — R001 Bradycardia, unspecified: Secondary | ICD-10-CM | POA: Diagnosis not present

## 2019-11-22 DIAGNOSIS — R001 Bradycardia, unspecified: Secondary | ICD-10-CM | POA: Diagnosis not present

## 2019-11-28 ENCOUNTER — Other Ambulatory Visit: Payer: Self-pay

## 2019-11-28 ENCOUNTER — Inpatient Hospital Stay (INDEPENDENT_AMBULATORY_CARE_PROVIDER_SITE_OTHER): Payer: 59 | Admitting: Primary Care

## 2019-11-28 ENCOUNTER — Encounter (INDEPENDENT_AMBULATORY_CARE_PROVIDER_SITE_OTHER): Payer: Self-pay | Admitting: Primary Care

## 2019-11-28 ENCOUNTER — Telehealth: Payer: Self-pay

## 2019-11-28 ENCOUNTER — Ambulatory Visit (INDEPENDENT_AMBULATORY_CARE_PROVIDER_SITE_OTHER): Payer: Medicaid Other | Admitting: Primary Care

## 2019-11-28 VITALS — BP 101/69 | HR 82 | Temp 97.3°F | Ht 74.0 in | Wt 275.2 lb

## 2019-11-28 DIAGNOSIS — E876 Hypokalemia: Secondary | ICD-10-CM | POA: Diagnosis not present

## 2019-11-28 DIAGNOSIS — I482 Chronic atrial fibrillation, unspecified: Secondary | ICD-10-CM | POA: Diagnosis not present

## 2019-11-28 DIAGNOSIS — R269 Unspecified abnormalities of gait and mobility: Secondary | ICD-10-CM

## 2019-11-28 DIAGNOSIS — Z794 Long term (current) use of insulin: Secondary | ICD-10-CM

## 2019-11-28 DIAGNOSIS — D509 Iron deficiency anemia, unspecified: Secondary | ICD-10-CM | POA: Diagnosis not present

## 2019-11-28 DIAGNOSIS — G459 Transient cerebral ischemic attack, unspecified: Secondary | ICD-10-CM | POA: Diagnosis not present

## 2019-11-28 DIAGNOSIS — E1100 Type 2 diabetes mellitus with hyperosmolarity without nonketotic hyperglycemic-hyperosmolar coma (NKHHC): Secondary | ICD-10-CM | POA: Diagnosis not present

## 2019-11-28 DIAGNOSIS — Z7689 Persons encountering health services in other specified circumstances: Secondary | ICD-10-CM | POA: Diagnosis not present

## 2019-11-28 LAB — POCT GLYCOSYLATED HEMOGLOBIN (HGB A1C): Hemoglobin A1C: 8.9 % — AB (ref 4.0–5.6)

## 2019-11-28 LAB — POCT CBG (FASTING - GLUCOSE)-MANUAL ENTRY: Glucose Fasting, POC: 184 mg/dL — AB (ref 70–99)

## 2019-11-28 MED ORDER — RIVAROXABAN 20 MG PO TABS: 20.0000 mg | ORAL_TABLET | Freq: Every day | ORAL | 1 refills | Status: DC

## 2019-11-28 MED ORDER — VITAMIN D (ERGOCALCIFEROL) 1.25 MG (50000 UNIT) PO CAPS: 50000.0000 [IU] | ORAL_CAPSULE | ORAL | 0 refills | Status: DC

## 2019-11-28 MED ORDER — SITAGLIPTIN PHOSPHATE 100 MG PO TABS: 100.0000 mg | ORAL_TABLET | Freq: Every day | ORAL | 1 refills | Status: DC

## 2019-11-28 MED ORDER — RIVAROXABAN 20 MG PO TABS: 20.0000 mg | ORAL_TABLET | Freq: Every day | ORAL | Status: DC

## 2019-11-28 NOTE — Progress Notes (Signed)
Established Patient Office Visit  Subjective:  Patient ID: Rhonda Hickman, female    DOB: [birth year]  Age: 64 y.o. MRN: [id number]  CC:  Chief Complaint  Patient presents with  . Palpitations  . Medication Management    Xarelto     HPI Rhonda Hickman is a 64 year old female who recently had several strokes she has not followed up with a neurologist. Patient was in [place] being taken care of by her family. Blood pressure is unremakable 101/69. Denies shortness of breath, headaches,  lower extremity edema, sudden onset, vision changes, unilateral weakness, dizziness, paresthesias . Endorses chest pain in the middle of chest stabbing migrates to her back, and has headaches intermittent for the last 3 months.  Past Medical History:  Diagnosis Date  . Anxiety   . Asthma   . Bipolar 1 disorder ([place])   . Bulging lumbar disc   . Chronic pain of left knee   . COPD (chronic obstructive pulmonary disease) ([employer])   . CVA (cerebral vascular accident) ([place])   . Diabetes mellitus   . Neuropathy     Past Surgical History:  Procedure Laterality Date  . ABDOMINAL HYSTERECTOMY    . CARPAL TUNNEL RELEASE Right   . KNEE ARTHROSCOPY WITH MEDIAL MENISECTOMY Right 04/14/2016   Procedure: KNEE ARTHROSCOPY WITH MEDIAL AND LATERAL MENISECTOMY, MICROFRACTURE REPAIR;  Surgeon: Carole Civil, MD;  Location: [facility];  Service: Orthopedics;  Laterality: Right;  lateral menisectomy - needs crutch training    Family History  Problem Relation Age of Onset  . Diabetes Mother   . Heart attack Father     Social History   Socioeconomic History  . Marital status: Single    Spouse name: Not on file  . Number of children: 0  . Years of education: 15  . Highest education level: High school graduate  Occupational History  . Occupation: Unemployed  Tobacco Use  . Smoking status: Current Some Day Smoker    Packs/day: 0.25    Types: Cigarettes  . Smokeless tobacco: Never Used  . Tobacco  comment: trying to cut down  Substance and Sexual Activity  . Alcohol use: No  . Drug use: No  . Sexual activity: Never  Other Topics Concern  . Not on file  Social History Narrative   Lives at home alone.   Right-handed.   No caffeine use.   Social Determinants of Health   Financial Resource Strain:   . Difficulty of Paying Living Expenses:   Food Insecurity:   . Worried About Charity fundraiser in the Last Year:   . Arboriculturist in the Last Year:   Transportation Needs:   . Film/video editor (Medical):   Marland Kitchen Lack of Transportation (Non-Medical):   Physical Activity:   . Days of Exercise per Week:   . Minutes of Exercise per Session:   Stress:   . Feeling of Stress :   Social Connections:   . Frequency of Communication with Friends and Family:   . Frequency of Social Gatherings with Friends and Family:   . Attends Religious Services:   . Active Member of Clubs or Organizations:   . Attends Archivist Meetings:   Marland Kitchen Marital Status:   Intimate Partner Violence:   . Fear of Current or Ex-Partner:   . Emotionally Abused:   Marland Kitchen Physically Abused:   . Sexually Abused:     Outpatient Medications Prior to Visit  Medication Sig Dispense  Refill  . albuterol (VENTOLIN HFA) 108 (90 Base) MCG/ACT inhaler Inhale 2 puffs into the lungs every 6 (six) hours as needed for wheezing or shortness of breath. 8 g 1  . amLODipine (NORVASC) 10 MG tablet Take 1 tablet (10 mg total) by mouth daily. 90 tablet 3  . budesonide-formoterol (SYMBICORT) 160-4.5 MCG/ACT inhaler Inhale 2 puffs into the lungs 2 (two) times daily. 1 Inhaler 3  . chlorhexidine (PERIDEX) 0.12 % solution RINSE MOUTH WITH 1 CAPFULLX30 SECONDS MORNING AND EVENING AFTER BRUSHING. THEN SPIT, NO SWALLOW    . divalproex (DEPAKOTE ER) 500 MG 24 hr tablet Take 500-1,000 mg by mouth See admin instructions. 500MG  in the morning and 1000mg  at bedtime    . ferrous sulfate 324 MG TBEC Take 1 tablet (324 mg total) by mouth  daily with breakfast. 90 tablet 1  . gabapentin (NEURONTIN) 300 MG capsule Take 3 capsules (900 mg total) by mouth 3 (three) times daily. 270 capsule 0  . insulin glargine (LANTUS SOLOSTAR) 100 UNIT/ML Solostar Pen Inject 20 Units into the skin daily. 5 pen 3  . lisinopril (ZESTRIL) 20 MG tablet Take 1 tablet (20 mg total) by mouth daily. 90 tablet 3  . metFORMIN (GLUCOPHAGE) 1000 MG tablet Take 1 tablet (1,000 mg total) by mouth 2 (two) times daily with a meal. 180 tablet 3  . oxybutynin (DITROPAN) 5 MG tablet Take 5 mg by mouth 2 (two) times daily.    . penicillin v potassium (VEETID) 500 MG tablet Take 500 mg by mouth 4 (four) times daily.    . traMADol (ULTRAM) 50 MG tablet Take 1 tablet (50 mg total) by mouth every 6 (six) hours as needed. 30 tablet 0  . diclofenac (VOLTAREN) 75 MG EC tablet TAKE  (1)  TABLET TWICE A DAY. 60 tablet 2   No facility-administered medications prior to visit.    Allergies  Allergen Reactions  . Aspirin Nausea Only and Other (See Comments)    Causes stomach pain  . Ibuprofen Other (See Comments)    Stomach problems    ROS Review of Systems  Musculoskeletal: Positive for gait problem.  Neurological: Positive for speech difficulty, weakness and headaches.  Psychiatric/Behavioral:       Memory loss  All other systems reviewed and are negative.     Objective:    Physical Exam  Constitutional: She is oriented to person, place, and time. She appears well-developed and well-nourished.  HENT:  Head: Normocephalic.  Eyes: Pupils are equal, round, and reactive to light. EOM are normal.  Cardiovascular: Normal rate and regular rhythm.  Pulmonary/Chest: Effort normal and breath sounds normal.  Abdominal: Bowel sounds are normal.  Musculoskeletal:     Cervical back: Normal range of motion and neck supple.     Comments: Unstable gait  Neurological: She is alert and oriented to person, place, and time.  Skin: Skin is warm and dry.  Psychiatric: She has  a normal mood and affect. Her behavior is normal. Judgment and thought content normal.    BP 101/69 (BP Location: Left Arm, Patient Position: Sitting, Cuff Size: Large)   Pulse 82   Temp (!) 97.3 F (36.3 C) (Temporal)   Ht 6\' 2"  (1.88 m)   Wt 275 lb 3.2 oz (124.8 kg)   SpO2 100%   BMI 35.33 kg/m  Wt Readings from Last 3 Encounters:  11/28/19 275 lb 3.2 oz (124.8 kg)  09/09/19 294 lb (133.4 kg)  08/21/19 294 lb (133.4 kg)  Health Maintenance Due  Topic Date Due  . Hepatitis C Screening  Never done  . PNEUMOCOCCAL POLYSACCHARIDE VACCINE AGE 35-64 HIGH RISK  Never done  . FOOT EXAM  Never done  . OPHTHALMOLOGY EXAM  Never done  . COVID-19 Vaccine (1) Never done  . PAP SMEAR-Modifier  Never done  . MAMMOGRAM  Never done  . COLONOSCOPY  Never done    There are no preventive care reminders to display for this patient.  Lab Results  Component Value Date   TSH 2.395 08/02/2019   Lab Results  Component Value Date   WBC 6.4 09/09/2019   HGB 10.9 (L) 09/09/2019   HCT 34.8 (L) 09/09/2019   MCV 89.2 09/09/2019   PLT 215 09/09/2019   Lab Results  Component Value Date   NA 140 09/09/2019   K 4.0 09/09/2019   CO2 27 09/09/2019   GLUCOSE 54 (L) 09/09/2019   BUN 13 09/09/2019   CREATININE 0.89 09/09/2019   BILITOT 0.2 (L) 02/11/2014   ALKPHOS 87 02/11/2014   AST 23 02/11/2014   ALT 21 02/11/2014   PROT 8.0 02/11/2014   ALBUMIN 3.8 02/11/2014   CALCIUM 8.7 (L) 09/09/2019   ANIONGAP 6 09/09/2019   Lab Results  Component Value Date   CHOL 146 08/04/2019   Lab Results  Component Value Date   HDL 13 (L) 08/04/2019   Lab Results  Component Value Date   LDLCALC 101 (H) 08/04/2019   Lab Results  Component Value Date   TRIG 161 (H) 08/04/2019   Lab Results  Component Value Date   CHOLHDL 11.2 08/04/2019   Lab Results  Component Value Date   HGBA1C 8.9 (A) 11/28/2019      Assessment & Plan:  Twan was seen today for palpitations and medication  management.  Diagnoses and all orders for this visit:  Type 2 diabetes mellitus with 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, with long-term current use of insulin ([place]) ADA recommends the following therapeutic goals for glycemic control related to A1c measurements: Goal of therapy: Less than 6.5 hemoglobin A1c. Decrease foods that are high in carbohydrates are the following rice, potatoes, breads, sugars, and pastas.  Reduction in the intake (eating) will assist in lowering your blood sugars. -     HgB A1c 8.9 uncontrolled  -     Glucose (CBG), Fasting Medication added sitaGLIPtin (JANUVIA) 100 MG tablet; Take 1 tablet (100 mg total) by mouth daily. Continue metformin 1000 mg twice daily and continue Lantus 20 units at bed time  TIA (transient ischemic attack) Follow up with neurology   Gait abnormality Status post TIA shuffle off balance   Chronic atrial fibrillation (HCC) -     rivaroxaban (XARELTO) tablet 20 mg -     Ambulatory referral to Cardiology  Iron deficiency anemia, unspecified iron deficiency anemia type -     CBC with Differential  Other orders -     Vitamin D, Ergocalciferol, (DRISDOL) 1.25 MG (50000 UNIT) CAPS capsule; Take 1 capsule (50,000 Units total) by mouth every 7 (seven) days.    Follow-up: Return in about 3 months (around 02/28/2020) for In person DM.    [name], NP

## 2019-11-28 NOTE — Telephone Encounter (Signed)
Referral received for home health RN and PT. Call placed to patient to inquire if she has a preference for home health agencies.  This CM explained that the options for agencies may be limited by her insurance and agency staffing.  She said that her sister recommended [facility].  The patient did not think that this agency provides the therapy services and she is also interested in [facility].  She will call this CM back with the phone number for [facility] to confirm if they provides skilled care or just personal care services [facility]) .   The patient was very interested in [facility] as she needs assistance with bathing and ADLs.  Informed her that this CM would check with Ms Oletta Lamas to prior to making the [facility] referral

## 2019-11-28 NOTE — Patient Instructions (Signed)
Cognitive Rehabilitation After a Stroke After a stroke, you may have various problems with thinking (cognitive disability). The types of problems you have will depend on how severe the stroke was and where it was located in the brain. Problems may include:  Problems with short-term memory.  Trouble paying attention.  Trouble communicating or understanding language (aphasia).  A drop in mental ability that may interfere with daily life (dementia).  Trouble with problem-solving and information processing.  Problems with reading, writing, or math.  Problems with your ability to plan and to perform activities in sequence (executive function). These problems can feel overwhelming. However, with rehabilitation and time to heal, many people have improvement in their symptoms. What causes cognitive disability? A stroke happens when blood cannot flow to certain areas of the brain. When this happens, brain cells die in the affected areas because they cannot get oxygen and nutrients from the blood. Cognitive disability is caused by the death of cells in the areas of the brain that control thinking. What is cognitive rehabilitation? Cognitive rehabilitation is a program to help you improve your thinking skills after a stroke. Rehabilitation cannot completely reverse the effects of a stroke, but it can help you with memory, problem-solving, and communication skills. Therapy focuses on:  Improving brain function. This may involve activities such as learning to break down tasks into simple steps.  Helping you learn ways to cope with thinking problems. For example, you might learn memory tricks or do activities that stimulate memory, such as naming objects or describing pictures. Cognitive rehabilitation may include:  Speech-language therapy to help you understand and use language to communicate.  Occupational therapy to help you perform daily activities.  Music therapy to help relieve stress,  anxiety, and depression. This may involve listening to music, singing, or playing instruments.  Physical therapy to help improve your ability to move and perform actions that involve the muscles (motor functions). When will therapy start and where will I have therapy? Your health care provider will decide when it is best for you to start therapy. In some cases, people start rehabilitation as soon as their health is stable, which may be 24-48 hours after the stroke. Rehabilitation can take place in a few different places, based on your needs. It may take place in:  The hospital or an in-patient rehabilitation hospital.  An outpatient rehabilitation facility.  A long-term care facility.  A community rehabilitation clinic.  Your home. What are some tools to help after a stroke? There are a number of tools and apps that you can use on your smartphone, personal computer, or tablet to help improve brain function. Some of these apps include:  Calendar reminders or alarm apps to help with memory.  Note-taking or sketch pad apps to help with memory or communication.  Text-to-speech apps that allow you to listen to what you are reading, which helps your ability to understanding text.  Picture dictionary or picture message apps to help with communication.  E-readers. These can highlight text as it is read aloud, which helps with listening and reading skills. How can my friends or family help during my rehabilitation? During your recovery, it is important that your friends and family members help you work toward more independence. Your caregivers should speak with your health care providers to learn how they can best help you during recovery. This may include working on speech-language or memory exercises at home, or helping with daily tasks and errands. If you have cognitive disability, you may   be at risk for injury or accidents at home, such as forgetting to turn off the stove. Friends and family  members can help ensure home safety by taking steps such as getting appliances with automatic shut-off features or storing dangerous objects in a secure place. What else should I know about cognitive rehabilitation after a stroke? Having trouble with memory and problem-solving can make you feel alone. You may also have mood changes, anxiety, or depression after a stroke. It is important to:  Stay connected with others through social groups, online support groups, or your community.  Talk to your friends, family, and caregivers about any emotional problems you are having.  Go to one-on-one or group therapy as suggested by your health care provider.  Stay physically active and exercise as often as suggested by your health care provider. Summary  After a stroke, some people have problems with thinking that affect attention, memory, language, communication, and problem-solving.  Cognitive rehabilitation is a program to help you regain brain function and learn skills to cope with thinking problems.  Rehabilitation cannot completely reverse the effects of a stroke, but it can help to improve quality of life.  Cognitive rehabilitation may include speech-language therapy, occupational therapy, music therapy, and physical therapy. This information is not intended to replace advice given to you by your health care provider. Make sure you discuss any questions you have with your health care provider. Document Revised: 10/17/2018 Document Reviewed: 09/30/2016 Elsevier Patient Education  2020 [employer]  

## 2019-11-29 LAB — CBC WITH DIFFERENTIAL/PLATELET
Basophils Absolute: 0.1 10*3/uL (ref 0.0–0.2)
Basos: 1 %
EOS (ABSOLUTE): 0.4 10*3/uL (ref 0.0–0.4)
Eos: 4 %
Hematocrit: 35 % (ref 34.0–46.6)
Hemoglobin: 11.9 g/dL (ref 11.1–15.9)
Immature Grans (Abs): 0 10*3/uL (ref 0.0–0.1)
Immature Granulocytes: 0 %
Lymphocytes Absolute: 4.2 10*3/uL — ABNORMAL HIGH (ref 0.7–3.1)
Lymphs: 45 %
MCH: 28.4 pg (ref 26.6–33.0)
MCHC: 34 g/dL (ref 31.5–35.7)
MCV: 84 fL (ref 79–97)
Monocytes Absolute: 0.6 10*3/uL (ref 0.1–0.9)
Monocytes: 6 %
Neutrophils Absolute: 4.1 10*3/uL (ref 1.4–7.0)
Neutrophils: 44 %
Platelets: 428 10*3/uL (ref 150–450)
RBC: 4.19 x10E6/uL (ref 3.77–5.28)
RDW: 15.6 % — ABNORMAL HIGH (ref 11.7–15.4)
WBC: 9.4 10*3/uL (ref 3.4–10.8)

## 2019-11-29 LAB — CMP14+EGFR
ALT: 12 IU/L (ref 0–32)
AST: 12 IU/L (ref 0–40)
Albumin/Globulin Ratio: 1.1 — ABNORMAL LOW (ref 1.2–2.2)
Albumin: 4.3 g/dL (ref 3.8–4.8)
Alkaline Phosphatase: 68 IU/L (ref 48–121)
BUN/Creatinine Ratio: 18 (ref 12–28)
BUN: 25 mg/dL (ref 8–27)
Bilirubin Total: <0.2 mg/dL (ref 0.0–1.2)
CO2: 21 mmol/L (ref 20–29)
Calcium: 10.1 mg/dL (ref 8.7–10.3)
Chloride: 104 mmol/L (ref 96–106)
Creatinine, Ser: 1.41 mg/dL — ABNORMAL HIGH (ref 0.57–1.00)
GFR calc Af Amer: 46 mL/min/{1.73_m2} — ABNORMAL LOW (ref >59)
GFR calc non Af Amer: 40 mL/min/{1.73_m2} — ABNORMAL LOW (ref >59)
Globulin, Total: 3.9 g/dL (ref 1.5–4.5)
Glucose: 150 mg/dL — ABNORMAL HIGH (ref 65–99)
Potassium: 4.5 mmol/L (ref 3.5–5.2)
Sodium: 139 mmol/L (ref 134–144)
Total Protein: 8.2 g/dL (ref 6.0–8.5)

## 2019-12-02 ENCOUNTER — Emergency Department (HOSPITAL_COMMUNITY)
Admission: EM | Admit: 2019-12-02 | Discharge: 2019-12-02 | Disposition: A | Discharge status: Home / Self Care | Payer: Medicaid Other | Attending: Emergency Medicine | Admitting: Emergency Medicine

## 2019-12-02 ENCOUNTER — Other Ambulatory Visit: Payer: Self-pay

## 2019-12-02 ENCOUNTER — Encounter (HOSPITAL_COMMUNITY): Payer: Self-pay | Admitting: *Deleted

## 2019-12-02 DIAGNOSIS — F17210 Nicotine dependence, cigarettes, uncomplicated: Secondary | ICD-10-CM | POA: Insufficient documentation

## 2019-12-02 DIAGNOSIS — L0231 Cutaneous abscess of buttock: Secondary | ICD-10-CM

## 2019-12-02 DIAGNOSIS — Z794 Long term (current) use of insulin: Secondary | ICD-10-CM | POA: Insufficient documentation

## 2019-12-02 DIAGNOSIS — J449 Chronic obstructive pulmonary disease, unspecified: Secondary | ICD-10-CM | POA: Insufficient documentation

## 2019-12-02 DIAGNOSIS — I4891 Unspecified atrial fibrillation: Secondary | ICD-10-CM | POA: Diagnosis not present

## 2019-12-02 DIAGNOSIS — Z79899 Other long term (current) drug therapy: Secondary | ICD-10-CM | POA: Diagnosis not present

## 2019-12-02 DIAGNOSIS — E119 Type 2 diabetes mellitus without complications: Secondary | ICD-10-CM | POA: Insufficient documentation

## 2019-12-02 HISTORY — DX: Anemia, unspecified: D64.9

## 2019-12-02 HISTORY — DX: Unspecified atrial fibrillation: I48.91

## 2019-12-02 LAB — CBG MONITORING, ED: Glucose-Capillary: 142 mg/dL — ABNORMAL HIGH (ref 70–99)

## 2019-12-02 MED ORDER — POVIDONE-IODINE 10 % EX SOLN
CUTANEOUS | Status: AC
  Filled 2019-12-02: qty 15

## 2019-12-02 MED ORDER — SULFAMETHOXAZOLE-TRIMETHOPRIM 400-80 MG PO TABS: 2.0000 | ORAL_TABLET | Freq: Two times a day (BID) | ORAL | 0 refills | Status: AC

## 2019-12-02 MED ORDER — LIDOCAINE HCL (PF) 1 % IJ SOLN
INTRAMUSCULAR | Status: AC
  Filled 2019-12-02: qty 5

## 2019-12-02 NOTE — ED Triage Notes (Signed)
Pt c/o abscess to right side of buttocks for a couple of weeks. Pt reports she was using Clorox and rubbing it on the area and noticed yesterday it was bleeding.

## 2019-12-02 NOTE — Discharge Instructions (Addendum)
As discussed, this abscess appears to be healing.  It is not need to be lanced as it is draining.  I recommend a warm Epsom salt soak for 15 minutes twice daily.  Take the entire course of the antibiotics prescribed.  Avoid squeezing the site.  Get rechecked for any persistent or worsening symptoms.

## 2019-12-03 ENCOUNTER — Telehealth (INDEPENDENT_AMBULATORY_CARE_PROVIDER_SITE_OTHER): Payer: Self-pay

## 2019-12-03 ENCOUNTER — Encounter: Payer: Self-pay | Admitting: Neurology

## 2019-12-03 ENCOUNTER — Ambulatory Visit: Payer: Medicaid Other | Admitting: Neurology

## 2019-12-03 VITALS — BP 103/61 | HR 62 | Ht 72.0 in | Wt 281.0 lb

## 2019-12-03 DIAGNOSIS — E1142 Type 2 diabetes mellitus with diabetic polyneuropathy: Secondary | ICD-10-CM | POA: Insufficient documentation

## 2019-12-03 DIAGNOSIS — G459 Transient cerebral ischemic attack, unspecified: Secondary | ICD-10-CM

## 2019-12-03 DIAGNOSIS — I482 Chronic atrial fibrillation, unspecified: Secondary | ICD-10-CM | POA: Insufficient documentation

## 2019-12-03 DIAGNOSIS — G43709 Chronic migraine without aura, not intractable, without status migrainosus: Secondary | ICD-10-CM

## 2019-12-03 DIAGNOSIS — IMO0002 Reserved for concepts with insufficient information to code with codable children: Secondary | ICD-10-CM | POA: Insufficient documentation

## 2019-12-03 DIAGNOSIS — Z7689 Persons encountering health services in other specified circumstances: Secondary | ICD-10-CM | POA: Diagnosis not present

## 2019-12-03 MED ORDER — GABAPENTIN 300 MG PO CAPS: 900.0000 mg | ORAL_CAPSULE | Freq: Three times a day (TID) | ORAL | 11 refills | Status: DC

## 2019-12-03 NOTE — Telephone Encounter (Signed)
-----   Message from [name], NP sent at 12/02/2019  2:04 PM EDT ----- Labs show slight decline in kidney function increase water decrease soda

## 2019-12-03 NOTE — Telephone Encounter (Signed)
Patient aware that labs show slight decline in kidney function increase water and decrease soda. She verbalized understanding. Nat Christen, CMA

## 2019-12-03 NOTE — Progress Notes (Signed)
PATIENT: Rhonda Hickman DOB: 18-Jan-1956  Chief Complaint  Patient presents with  . Transient Ischemic Attack    Reports an epsiode of left-sided weakness, tingling and numbness. She has also noticed a a change in her speech. She has been started on Xarelto 20mg  daily.   Marland Kitchen PCP    [name], NP     HISTORICAL  Rhonda Hickman is a 64 year old female, seen in request by her primary care nurse practitioner [name] for evaluation of TIA, saw her previously for low back pain  I have reviewed and summarized the referring note from the referring physician.  She has past medical history of bipolar disorder, on Depakote, citalopram 40 mg daily COPD, longtime smoker, still smokes, diabetes, poorly controlled, A1c has been persistently higher than 8.5, diabetic neuropathy, was just recently diagnosed with atrial fibrillation, taking Xarelto 20 mg daily, she recently had all her teeth pulled out due to poor dental hygiene, is taking penicillin  She is a poor historian, also has erratic behavior during today's visit, was talking normally initially, then starting talking childish, she could not discriminate discrete history of the spells that are leading to the referral, I reviewed hospital discharge on January 22-25 2021, was admitted to the hospital for complaints of dizziness for 2 weeks, primary care physician also reported episode of left-sided weakness, tingling, numbness, and slurred speech  Patient also reported long history of headaches, increased frequency over the past few months, 2-3 times each week, with associated light noise sensitivity, nauseous, worsening by movement, usually relieved by sleeping, she is not taking extra medication for headaches,   I personally reviewed MRI of the brain in January 2021, motion degraded examination, there was no positive DWI lesion, moderate patchy T2/flair hyperdensity at cerebral white matter, consistent with chronic small vessel  disease  Echocardiogram, ejection fraction 60 to 65%, no regional wall motion abnormality.  Laboratory evaluations in 2021, CMP showed elevated creatinine 1.4, glucose of 150, CBC showed hemoglobin of 11.9, mild elevated RDW of 15.6, A1c 8.9, was 11.5 in November 2019, has been persistently above 8.5 over the past couple years, lipid panel triglyceride 161, LDL 101, negative HIV, normal B12, TSH,  REVIEW OF SYSTEMS: Full 14 system review of systems performed and notable only for as above All other review of systems were negative.  ALLERGIES: Allergies  Allergen Reactions  . Aspirin Nausea Only and Other (See Comments)    Causes stomach pain    HOME MEDICATIONS: Current Outpatient Medications  Medication Sig Dispense Refill  . albuterol (VENTOLIN HFA) 108 (90 Base) MCG/ACT inhaler Inhale 2 puffs into the lungs every 6 (six) hours as needed for wheezing or shortness of breath. 8 g 1  . amLODipine (NORVASC) 10 MG tablet Take 1 tablet (10 mg total) by mouth daily. 90 tablet 3  . budesonide-formoterol (SYMBICORT) 160-4.5 MCG/ACT inhaler Inhale 2 puffs into the lungs 2 (two) times daily. 1 Inhaler 3  . chlorhexidine (PERIDEX) 0.12 % solution RINSE MOUTH WITH 1 CAPFULLX30 SECONDS MORNING AND EVENING AFTER BRUSHING. THEN SPIT, NO SWALLOW    . citalopram (CELEXA) 40 MG tablet Take 40 mg by mouth daily.    . diclofenac (VOLTAREN) 50 MG EC tablet Take 50 mg by mouth 2 (two) times daily.    . divalproex (DEPAKOTE ER) 500 MG 24 hr tablet Take 500-1,000 mg by mouth See admin instructions. 500MG  in the morning and 1000mg  at bedtime    . ferrous sulfate 324 MG TBEC Take 1 tablet (  324 mg total) by mouth daily with breakfast. 90 tablet 1  . gabapentin (NEURONTIN) 300 MG capsule Take 3 capsules (900 mg total) by mouth 3 (three) times daily. 270 capsule 0  . insulin glargine (LANTUS SOLOSTAR) 100 UNIT/ML Solostar Pen Inject 20 Units into the skin daily. 5 pen 3  . lisinopril (ZESTRIL) 20 MG tablet Take 1  tablet (20 mg total) by mouth daily. 90 tablet 3  . metFORMIN (GLUCOPHAGE) 1000 MG tablet Take 1 tablet (1,000 mg total) by mouth 2 (two) times daily with a meal. 180 tablet 3  . oxybutynin (DITROPAN) 5 MG tablet Take 5 mg by mouth 2 (two) times daily.    . penicillin v potassium (VEETID) 500 MG tablet Take 500 mg by mouth 4 (four) times daily.    . rivaroxaban (XARELTO) 20 MG TABS tablet Take 1 tablet (20 mg total) by mouth daily with supper. 30 tablet 1  . sitaGLIPtin (JANUVIA) 100 MG tablet Take 1 tablet (100 mg total) by mouth daily. 90 tablet 1  . sulfamethoxazole-trimethoprim (BACTRIM) 400-80 MG tablet Take 2 tablets by mouth 2 (two) times daily for 7 days. 28 tablet 0  . traMADol (ULTRAM) 50 MG tablet Take 1 tablet (50 mg total) by mouth every 6 (six) hours as needed. 30 tablet 0  . Vitamin D, Ergocalciferol, (DRISDOL) 1.25 MG (50000 UNIT) CAPS capsule Take 1 capsule (50,000 Units total) by mouth every 7 (seven) days. 32 capsule 0   No current facility-administered medications for this visit.    PAST MEDICAL HISTORY: Past Medical History:  Diagnosis Date  . Anemia   . Anxiety   . Asthma   . Atrial fibrillation ([place])   . Bipolar 1 disorder ([place])   . Bulging lumbar disc   . Chronic pain of left knee   . COPD (chronic obstructive pulmonary disease) ([place])   . CVA (cerebral vascular accident) ([place])   . Diabetes mellitus   . Neuropathy   . TIA (transient ischemic attack)     PAST SURGICAL HISTORY: Past Surgical History:  Procedure Laterality Date  . ABDOMINAL HYSTERECTOMY    . CARPAL TUNNEL RELEASE Right   . KNEE ARTHROSCOPY WITH MEDIAL MENISECTOMY Right 04/14/2016   Procedure: KNEE ARTHROSCOPY WITH MEDIAL AND LATERAL MENISECTOMY, MICROFRACTURE REPAIR;  Surgeon: Carole Civil, MD;  Location: [facility];  Service: Orthopedics;  Laterality: Right;  lateral menisectomy - needs crutch training    FAMILY HISTORY: Family History  Problem Relation Age of Onset  . Diabetes Mother     . Heart attack Father     SOCIAL HISTORY: Social History   Socioeconomic History  . Marital status: Single    Spouse name: Not on file  . Number of children: 0  . Years of education: 69  . Highest education level: High school graduate  Occupational History  . Occupation: Unemployed  Tobacco Use  . Smoking status: Current Some Day Smoker    Packs/day: 0.50    Types: Cigarettes  . Smokeless tobacco: Never Used  Substance and Sexual Activity  . Alcohol use: No  . Drug use: No  . Sexual activity: Never  Other Topics Concern  . Not on file  Social History Narrative   Lives at home alone.   Right-handed.   No caffeine use.   Social Determinants of Health   Financial Resource Strain:   . Difficulty of Paying Living Expenses:   Food Insecurity:   . Worried About Charity fundraiser in the Last Year:   .  Ran Out of Food in the Last Year:   Transportation Needs:   . Film/video editor (Medical):   Marland Kitchen Lack of Transportation (Non-Medical):   Physical Activity:   . Days of Exercise per Week:   . Minutes of Exercise per Session:   Stress:   . Feeling of Stress :   Social Connections:   . Frequency of Communication with Friends and Family:   . Frequency of Social Gatherings with Friends and Family:   . Attends Religious Services:   . Active Member of Clubs or Organizations:   . Attends Archivist Meetings:   Marland Kitchen Marital Status:   Intimate Partner Violence:   . Fear of Current or Ex-Partner:   . Emotionally Abused:   Marland Kitchen Physically Abused:   . Sexually Abused:      PHYSICAL EXAM   Vitals:   12/03/19 0850  Weight: 281 lb (127.5 kg)  Height: 6' (1.829 m)    Not recorded      Body mass index is 38.11 kg/m.  PHYSICAL EXAMNIATION:  Gen: NAD, conversant, well nourised, well groomed                     Cardiovascular: Regular rate rhythm, no peripheral edema, warm, nontender. Eyes: Conjunctivae clear without exudates or hemorrhage Neck: Supple, no  carotid bruits. Pulmonary: Clear to auscultation bilaterally   NEUROLOGICAL EXAM:  MENTAL STATUS: Speech/cognition: She was talking normally initially, then suddenly began to talk childish, stuttering, with distraction, she can recover to normal speech,   CRANIAL NERVES: CN II: Visual fields are full to confrontation. Pupils are round equal and briskly reactive to light. CN III, IV, VI: extraocular movement are normal. No ptosis. CN V: Facial sensation is intact to light touch CN VII: Face is symmetric with normal eye closure  CN VIII: Hearing is normal to causal conversation. CN IX, X: Phonation is normal. CN XI: Head turning and shoulder shrug are intact  MOTOR: Variable effort, moving 4 extremities without difficulties  REFLEXES: Reflexes are hypoactive and symmetric at the biceps, triceps, knees, and ankles. Plantar responses are flexor.  SENSORY: Length dependent decreased light touch pinprick to ankle level COORDINATION:  There is no trunk or limb dysmetria noted.  GAIT/STANCE: Mild antalgic due to right knee pain  DIAGNOSTIC DATA (LABS, IMAGING, TESTING) - I reviewed patient records, labs, notes, testing and imaging myself where available.   ASSESSMENT AND PLAN  Nikeeta Koons is a 64 y.o. female   Possible TIA  Most recent MRI of brain in January 2020 showed no acute abnormality, motion degraded artifact  She does has multiple vascular risk factors, poorly controlled diabetes, A1c persistently higher than 8.5, hypertension, diabetes, obesity, longtime smoker, Newly diagnosed atrial fibrillation,  Is already on Xarelto treatment,  Complete vascular evaluation with ultrasound of carotid artery, will call her report, continue anticoagulation Diabetic peripheral neuropathy  Will refill her gabapentin 300 mg 3 tablets 3 times a day Chronic migraine headaches  Often relieved by sleep, limited medication choice because of her comorbidity, Tylenol as needed  Only  return to clinic for new issues   Marcial Pacas, M.D. Ph.D.  [facility] [address] [place], [place] [address] Ph: [phone] Fax: [phone]  CC: [name], NP

## 2019-12-03 NOTE — ED Provider Notes (Signed)
[facility] EMERGENCY DEPARTMENT Provider Note   CSN: [id number] Arrival date & time: 12/02/19  1125     History Chief Complaint  Patient presents with  . Abscess    Rhonda Hickman is a 64 y.o. female with a history significant for diabetes, COPD and history of atrial fibrillation presenting for evaluation of a right buttock abscess which has been present for several days.  She noticed a small tender "pimple" at the site about 2 weeks ago which she has been treating with Clorox and peroxide.  She noticed the site start draining yesterday as she found a spot of blood on her panties and was concerned about this drainage.  She does have a history of prior small abscesses, none of which have required I&D.  She has also been employing warm compresses to the site.  Her blood glucose close levels have been well controlled, she has not measured this today.  The history is provided by the patient.       Past Medical History:  Diagnosis Date  . Anemia   . Anxiety   . Asthma   . Atrial fibrillation ([place])   . Bipolar 1 disorder ([place])   . Bulging lumbar disc   . Chronic pain of left knee   . COPD (chronic obstructive pulmonary disease) ([place])   . CVA (cerebral vascular accident) ([place])   . Diabetes mellitus   . Neuropathy     Patient Active Problem List   Diagnosis Date Noted  . Vertigo 08/03/2019  . Slurred speech 08/03/2019  . Leukocytosis 08/03/2019  . Bronchiectasis with acute exacerbation ([place])   . Obesity, Class III, BMI 40-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) ([place])   . Gastroesophageal reflux disease   . Asthma exacerbation 06/06/2018  . Gait abnormality 08/29/2017  . Urinary incontinence 08/29/2017  . Paresthesia 08/29/2017  . Derangement of posterior horn of medial meniscus of right knee   . Meniscus, lateral, derangement, right   . Arthritis of knee, right   . COPD with acute exacerbation ([place]) 03/09/2016  . Type 2 diabetes mellitus ([place]) 02/16/2007  . Cocaine abuse ([place]) 02/16/2007   . Extrinsic asthma 02/16/2007  . HOMELESSNESS, HX OF 02/16/2007    Past Surgical History:  Procedure Laterality Date  . ABDOMINAL HYSTERECTOMY    . CARPAL TUNNEL RELEASE Right   . KNEE ARTHROSCOPY WITH MEDIAL MENISECTOMY Right 04/14/2016   Procedure: KNEE ARTHROSCOPY WITH MEDIAL AND LATERAL MENISECTOMY, MICROFRACTURE REPAIR;  Surgeon: Carole Civil, MD;  Location: [facility];  Service: Orthopedics;  Laterality: Right;  lateral menisectomy - needs crutch training     OB History    Gravida      Para      Term      Preterm      AB      Living  0     SAB      TAB      Ectopic      Multiple      Live Births              Family History  Problem Relation Age of Onset  . Diabetes Mother   . Heart attack Father     Social History   Tobacco Use  . Smoking status: Current Some Day Smoker    Packs/day: 0.50    Types: Cigarettes  . Smokeless tobacco: Never Used  Substance Use Topics  . Alcohol use: No  . Drug use: No    Home Medications Prior to Admission medications  Medication Sig Start Date End Date Taking? Authorizing Provider  albuterol (VENTOLIN HFA) 108 (90 Base) MCG/ACT inhaler Inhale 2 puffs into the lungs every 6 (six) hours as needed for wheezing or shortness of breath. 09/15/19   [name], NP  amLODipine (NORVASC) 10 MG tablet Take 1 tablet (10 mg total) by mouth daily. 09/15/19   [name], NP  budesonide-formoterol (SYMBICORT) 160-4.5 MCG/ACT inhaler Inhale 2 puffs into the lungs 2 (two) times daily. 09/15/19   [name], NP  chlorhexidine (PERIDEX) 0.12 % solution RINSE MOUTH WITH 1 CAPFULLX30 SECONDS MORNING AND EVENING AFTER BRUSHING. THEN SPIT, NO SWALLOW 11/19/19   [provider]  divalproex (DEPAKOTE ER) 500 MG 24 hr tablet Take 500-1,000 mg by mouth See admin instructions. 500MG  in the morning and 1000mg  at bedtime 03/26/18   [provider]  ferrous sulfate 324 MG TBEC Take 1 tablet (324 mg total)  by mouth daily with breakfast. 09/15/19   [name], NP  gabapentin (NEURONTIN) 300 MG capsule Take 3 capsules (900 mg total) by mouth 3 (three) times daily. 10/17/19   Suzzanne Cloud, NP  insulin glargine (LANTUS SOLOSTAR) 100 UNIT/ML Solostar Pen Inject 20 Units into the skin daily. 09/15/19   [name], NP  lisinopril (ZESTRIL) 20 MG tablet Take 1 tablet (20 mg total) by mouth daily. 09/15/19   [name], NP  metFORMIN (GLUCOPHAGE) 1000 MG tablet Take 1 tablet (1,000 mg total) by mouth 2 (two) times daily with a meal. 09/15/19   [name], NP  oxybutynin (DITROPAN) 5 MG tablet Take 5 mg by mouth 2 (two) times daily. 10/21/19   [provider]  penicillin v potassium (VEETID) 500 MG tablet Take 500 mg by mouth 4 (four) times daily. 11/19/19   [provider]  rivaroxaban (XARELTO) 20 MG TABS tablet Take 1 tablet (20 mg total) by mouth daily with supper. 11/28/19   [name], NP  sitaGLIPtin (JANUVIA) 100 MG tablet Take 1 tablet (100 mg total) by mouth daily. 11/28/19   [name], NP  sulfamethoxazole-trimethoprim (BACTRIM) 400-80 MG tablet Take 2 tablets by mouth 2 (two) times daily for 7 days. 12/02/19 12/09/19  Evalee Jefferson, PA-C  traMADol (ULTRAM) 50 MG tablet Take 1 tablet (50 mg total) by mouth every 6 (six) hours as needed. 08/21/19   Carole Civil, MD  Vitamin D, Ergocalciferol, (DRISDOL) 1.25 MG (50000 UNIT) CAPS capsule Take 1 capsule (50,000 Units total) by mouth every 7 (seven) days. 11/28/19   [name], NP    Allergies    Aspirin  Review of Systems   Review of Systems  Constitutional: Negative for chills and fever.  HENT: Negative.   Eyes: Negative.   Respiratory: Negative.   Cardiovascular: Negative for chest pain.  Gastrointestinal: Negative for abdominal pain, nausea and vomiting.  Genitourinary: Negative.   Musculoskeletal: Negative for arthralgias, joint swelling and neck pain.  Skin: Negative  for rash and wound.       Negative except as mentioned in HPI.   Neurological: Negative.   Psychiatric/Behavioral: Negative.     Physical Exam Updated Vital Signs BP 119/68 (BP Location: Right Arm)   Pulse 74   Temp 97.9 F (36.6 C) (Oral)   Resp 18   Ht 6' (1.829 m)   Wt 126.6 kg   SpO2 97%   BMI 37.84 kg/m   Physical Exam Constitutional:      General: She is not in  acute distress.    Appearance: She is well-developed.  HENT:     Head: Normocephalic.  Cardiovascular:     Rate and Rhythm: Normal rate.  Pulmonary:     Effort: Pulmonary effort is normal.     Breath sounds: No wheezing.  Musculoskeletal:        General: Normal range of motion.     Cervical back: Neck supple.  Skin:    Findings: Abscess present.     Comments: Patient has a small draining abscess at her right upper medial buttock region.  There is a linear 0.5 cm open area with minimal surrounding edema and induration.  There is no active drainage or fluctuance.  The depth of the site is about 3 mm with healthy-appearing granulation tissue present.  It does not appear to be any tracking towards the rectum.  No red streaking or surrounding erythema.     ED Results / Procedures / Treatments   Labs (all labs ordered are listed, but only abnormal results are displayed) Labs Reviewed  CBG MONITORING, ED - Abnormal; Notable for the following components:      Result Value   Glucose-Capillary 142 (*)    All other components within normal limits    EKG None  Radiology No results found.  Procedures Procedures (including critical care time)  Medications Ordered in ED Medications - No data to display  ED Course  I have reviewed the triage vital signs and the nursing notes.  Pertinent labs & imaging results that were available during my care of the patient were reviewed by me and considered in my medical decision making (see chart for details).    MDM Rules/Calculators/A&P                       Patient with a small open and draining abscess of the right medial buttock.  It appears to be healing spontaneously.  Patient was advised warm water soaks twice daily, stop using Clorox or peroxide on the wound.  She was placed on Bactrim for twice daily dosing.  Plan as needed follow-up with her PCP or return here for any worsening symptoms.  CBG was reviewed and is stable. Final Clinical Impression(s) / ED Diagnoses Final diagnoses:  Abscess of buttock, right    Rx / DC Orders ED Discharge Orders         Ordered    sulfamethoxazole-trimethoprim (BACTRIM) 400-80 MG tablet  2 times daily     12/02/19 1442           Evalee Jefferson, PA-C 12/03/19 1802    Elnora Morrison, MD 12/05/19 [phone]

## 2019-12-04 ENCOUNTER — Telehealth (INDEPENDENT_AMBULATORY_CARE_PROVIDER_SITE_OTHER): Payer: Self-pay

## 2019-12-04 DIAGNOSIS — F329 Major depressive disorder, single episode, unspecified: Secondary | ICD-10-CM | POA: Diagnosis not present

## 2019-12-04 NOTE — Telephone Encounter (Signed)
Patient called requesting a call back from PCP in regards to her [place] orders. Patient wants to clarify the need of an aide.  Please advice [phone]

## 2019-12-05 DIAGNOSIS — F329 Major depressive disorder, single episode, unspecified: Secondary | ICD-10-CM | POA: Diagnosis not present

## 2019-12-05 NOTE — Telephone Encounter (Signed)
Sent to PCP ?

## 2019-12-06 ENCOUNTER — Telehealth: Payer: Self-pay

## 2019-12-06 NOTE — Telephone Encounter (Signed)
Call received from the patient.  She said that [facility] # [phone] only provides personal care services. Explained to her that a referral for PCS can be made to [facility].  They will do an assessment and when they ask her what agency she would like to use for those services, she can tell them Davonna Belling.   Regarding home health RN,PT,ST, a separate referral can be made to a home health agency. The patient did not have an agency preference.  Explained to her that this CM can contact multiple agencies that service [facility] but there is no guarantee that any of them will be able to accept the referral.  She said that she understands and could go to outpatient therapy if needed.  She can use [facility] for transportation,

## 2019-12-06 NOTE — Telephone Encounter (Signed)
Hello Rhonda Hickman  ST is speech therapy

## 2019-12-06 NOTE — Telephone Encounter (Signed)
Call placed to the patient # [phone]  to inquire if she has the contact information for [facility] to confirm the services that they can provide.  Message left with call back requested to this CM

## 2019-12-10 ENCOUNTER — Other Ambulatory Visit (INDEPENDENT_AMBULATORY_CARE_PROVIDER_SITE_OTHER): Payer: Self-pay | Admitting: Primary Care

## 2019-12-10 DIAGNOSIS — G459 Transient cerebral ischemic attack, unspecified: Secondary | ICD-10-CM

## 2019-12-10 DIAGNOSIS — R4781 Slurred speech: Secondary | ICD-10-CM

## 2019-12-10 NOTE — Telephone Encounter (Signed)
Temp that it maybe something else sent in order for ST

## 2019-12-13 NOTE — Telephone Encounter (Signed)
Yes please fill out and will add ST to PT

## 2019-12-15 NOTE — Progress Notes (Signed)
Cardiology Office Note:    Date:  12/16/2019   ID:  Rhonda Hickman, DOB 09-24-1955, MRN [id number]  PCP:  [name], NP  Cardiologist:  No primary care provider on file.  Electrophysiologist:  None   Referring MD: [name], NP   Chief Complaint  Patient presents with  . Atrial Fibrillation    History of Present Illness:    Rhonda Hickman is a 64 y.o. female with a hx of TIA, atrial fibrillation, BPD, COPD, T2DM, asthma who is referred by [name], NP for evaluation of atrial fibrillation.  She reports that she was in the hospital for 5 days last month in [place].  States that she was admitted with AKI and UTI and found to have atrial fibrillation.  Started on Xarelto.  States that she has been taking this without any bleeding issues.  Reports she has had intermittent palpitations over the last month since her discharge from the hospital.  She reports a history of stabbing chest pain in the center of her chest but has not had any recently, does however chest pressure with exertion.  States that going for walks causes her to have exertional chest pressure.  She is smoking about half a pack per day, down from peak of 2 packs/day.  States that both her mother and father had MIs but unsure of age.  TTE on 08/03/2019 showed LVEF 60-65%, normal RV function, normal biatrial size, no significant valvular disease.    Past Medical History:  Diagnosis Date  . Anemia   . Anxiety   . Asthma   . Atrial fibrillation ([place])   . Bipolar 1 disorder ([place])   . Bulging lumbar disc   . Chronic pain of left knee   . COPD (chronic obstructive pulmonary disease) ([place])   . CVA (cerebral vascular accident) ([place])   . Diabetes mellitus   . Neuropathy   . TIA (transient ischemic attack)     Past Surgical History:  Procedure Laterality Date  . ABDOMINAL HYSTERECTOMY    . CARPAL TUNNEL RELEASE Right   . KNEE ARTHROSCOPY WITH MEDIAL MENISECTOMY Right 04/14/2016    Procedure: KNEE ARTHROSCOPY WITH MEDIAL AND LATERAL MENISECTOMY, MICROFRACTURE REPAIR;  Surgeon: Carole Civil, MD;  Location: [facility];  Service: Orthopedics;  Laterality: Right;  lateral menisectomy - needs crutch training    Current Medications: Current Meds  Medication Sig  . albuterol (VENTOLIN HFA) 108 (90 Base) MCG/ACT inhaler Inhale 2 puffs into the lungs every 6 (six) hours as needed for wheezing or shortness of breath.  Marland Kitchen amLODipine (NORVASC) 10 MG tablet Take 1 tablet (10 mg total) by mouth daily.  . budesonide-formoterol (SYMBICORT) 160-4.5 MCG/ACT inhaler Inhale 2 puffs into the lungs 2 (two) times daily.  . chlorhexidine (PERIDEX) 0.12 % solution RINSE MOUTH WITH 1 CAPFULLX30 SECONDS MORNING AND EVENING AFTER BRUSHING. THEN SPIT, NO SWALLOW  . citalopram (CELEXA) 40 MG tablet Take 40 mg by mouth daily.  . diclofenac (VOLTAREN) 50 MG EC tablet Take 50 mg by mouth 2 (two) times daily.  . divalproex (DEPAKOTE ER) 500 MG 24 hr tablet Take 500-1,000 mg by mouth See admin instructions. 500MG  in the morning and 1000mg  at bedtime  . ferrous sulfate 324 MG TBEC Take 1 tablet (324 mg total) by mouth daily with breakfast.  . gabapentin (NEURONTIN) 300 MG capsule Take 3 capsules (900 mg total) by mouth 3 (three) times daily.  . insulin glargine (LANTUS SOLOSTAR) 100 UNIT/ML Solostar Pen Inject 20  Units into the skin daily.  Marland Kitchen lisinopril (ZESTRIL) 20 MG tablet Take 1 tablet (20 mg total) by mouth daily.  . metFORMIN (GLUCOPHAGE) 1000 MG tablet Take 1 tablet (1,000 mg total) by mouth 2 (two) times daily with a meal.  . oxybutynin (DITROPAN) 5 MG tablet Take 5 mg by mouth 2 (two) times daily.  . penicillin v potassium (VEETID) 500 MG tablet Take 500 mg by mouth 4 (four) times daily.  . rivaroxaban (XARELTO) 20 MG TABS tablet Take 1 tablet (20 mg total) by mouth daily with supper.  . sitaGLIPtin (JANUVIA) 100 MG tablet Take 1 tablet (100 mg total) by mouth daily.  . traMADol (ULTRAM) 50 MG  tablet Take 1 tablet (50 mg total) by mouth every 6 (six) hours as needed.  . Vitamin D, Ergocalciferol, (DRISDOL) 1.25 MG (50000 UNIT) CAPS capsule Take 1 capsule (50,000 Units total) by mouth every 7 (seven) days.     Allergies:   Aspirin   Social History   Socioeconomic History  . Marital status: Single    Spouse name: Not on file  . Number of children: 0  . Years of education: 8  . Highest education level: High school graduate  Occupational History  . Occupation: Unemployed  Tobacco Use  . Smoking status: Current Some Day Smoker    Packs/day: 0.50    Types: Cigarettes  . Smokeless tobacco: Never Used  Substance and Sexual Activity  . Alcohol use: No  . Drug use: No  . Sexual activity: Never  Other Topics Concern  . Not on file  Social History Narrative   Lives at home alone.   Right-handed.   No caffeine use.   Social Determinants of Health   Financial Resource Strain:   . Difficulty of Paying Living Expenses:   Food Insecurity:   . Worried About Charity fundraiser in the Last Year:   . Arboriculturist in the Last Year:   Transportation Needs:   . Film/video editor (Medical):   Marland Kitchen Lack of Transportation (Non-Medical):   Physical Activity:   . Days of Exercise per Week:   . Minutes of Exercise per Session:   Stress:   . Feeling of Stress :   Social Connections:   . Frequency of Communication with Friends and Family:   . Frequency of Social Gatherings with Friends and Family:   . Attends Religious Services:   . Active Member of Clubs or Organizations:   . Attends Archivist Meetings:   Marland Kitchen Marital Status:      Family History: The patient's family history includes Diabetes in her mother; Heart attack in her father.  ROS:   Please see the history of present illness.     All other systems reviewed and are negative.  EKGs/Labs/Other Studies Reviewed:    The following studies were reviewed today:   EKG:  EKG is ordered today.  The ekg  ordered today demonstrates normal sinus rhythm, rate 72, no ST/T abnormalities  Recent Labs: 08/02/2019: TSH 2.395 11/28/2019: ALT 12; BUN 25; Creatinine, Ser 1.41; Hemoglobin 11.9; Platelets 428; Potassium 4.5; Sodium 139  Recent Lipid Panel    Component Value Date/Time   CHOL 146 08/04/2019 0548   TRIG 161 (H) 08/04/2019 0548   HDL 13 (L) 08/04/2019 0548   CHOLHDL 11.2 08/04/2019 0548   VLDL 32 08/04/2019 0548   LDLCALC 101 (H) 08/04/2019 0548    Physical Exam:    VS:  BP 140/80  Pulse 72   Temp (!) 96.9 F (36.1 C)   Ht 6' (1.829 m)   Wt 278 lb 3.2 oz (126.2 kg)   SpO2 93%   BMI 37.73 kg/m     Wt Readings from Last 3 Encounters:  12/16/19 278 lb 3.2 oz (126.2 kg)  12/03/19 281 lb (127.5 kg)  12/02/19 279 lb (126.6 kg)     GEN: in no acute distress HEENT: Normal NECK: No JVD LYMPHATICS: No lymphadenopathy CARDIAC: RRR, no murmurs, rubs, gallops RESPIRATORY:  Clear to auscultation without rales, wheezing or rhonchi  ABDOMEN: Soft, non-tender, non-distended MUSCULOSKELETAL:  No edema SKIN: Warm and dry NEUROLOGIC:  Alert and oriented x 3 PSYCHIATRIC:  Normal affect   ASSESSMENT:    1. Chest pain, unspecified type   2. Atrial fibrillation, unspecified type ([place])   3. Snoring   4. Essential hypertension   5. Tobacco use    PLAN:     Chest pain: A description suggest typical angina and has significant risk factors (type 2 diabetes, hypertension, tobacco use) - Coronary CTA.  Had recent AKI, will check BMP to ensure resolution.  If creatinine remains elevated, will plan for [facility] instead of coronary CTA. - Start metoprolol 25 mg twice daily  Atrial fibrillation: CHA2DS2-VASc score 5 (hypertension, diabetes, CVA, female) -Continue Xarelto -Sleep study -Obtain records of prior hospitalization in [place] where AF was diagnosed -She is not on any rate controlling agents.  Adding metoprolol 25 mg twice daily for antianginal effect as  above.  Will check Zio patch x2 weeks to evaluate for further AF/adequate rate control  Hypertension: On amlodipine 10 mg daily, lisinopril 20 mg daily.  BP elevated, will add metoprolol as above  T2DM: On insulin.  A1c 8.9  Hyperlipidemia: Given history of diabetes and possible CVA, should be on statin, will start rosuvastatin 10 mg daily  Tobacco use: Patient counseled on the risks of tobacco use and cessation strongly encouraged  TIA: Follows with neurology.  Snoring: We will order sleep study to evaluate for OSA  RTC in 3 months  Medication Adjustments/Labs and Tests Ordered: Current medicines are reviewed at length with the patient today.  Concerns regarding medicines are outlined above.  Orders Placed This Encounter  Procedures  . CT CORONARY MORPH W/CTA COR W/SCORE W/CA W/CM &/OR WO/CM  . CT CORONARY FRACTIONAL FLOW RESERVE DATA PREP  . CT CORONARY FRACTIONAL FLOW RESERVE FLUID ANALYSIS  . Basic metabolic panel  . LONG TERM MONITOR (3-14 DAYS)  . EKG 12-Lead  . Split night study   Meds ordered this encounter  Medications  . DISCONTD: metoprolol tartrate (LOPRESSOR) 100 MG tablet    Sig: Take 100 mg (1 tablet) TWO hours prior to CT    Dispense:  1 tablet    Refill:  0  . rosuvastatin (CRESTOR) 10 MG tablet    Sig: Take 1 tablet (10 mg total) by mouth daily.    Dispense:  90 tablet    Refill:  3  . metoprolol tartrate (LOPRESSOR) 25 MG tablet    Sig: Take 1 tablet (25 mg total) by mouth 2 (two) times daily.    Dispense:  180 tablet    Refill:  3    DISREGARD 100 mg one time dose    Patient Instructions  Medication Instructions:  START rosuvastatin (Crestor) 10 mg daily START metoprolol tartrate (Lopressor) 25 mg two times daily  *If you need a refill on your cardiac medications before your next appointment, please  call your pharmacy*   Lab Work: BMET today  If you have labs (blood work) drawn today and your tests are completely normal, you will receive  your results only by: Marland Kitchen MyChart Message (if you have MyChart) OR . A paper copy in the mail If you have any lab test that is abnormal or we need to change your treatment, we will call you to review the results.   Testing/Procedures: Your physician has requested that you have cardiac CT. Cardiac computed tomography (CT) is a painless test that uses an x-ray machine to take clear, detailed pictures of your heart. For further information please visit HugeFiesta.tn. Please follow instruction sheet as given.    ZIO XT- Long Term Monitor Instructions   Your physician has requested you wear your ZIO patch monitor 14 days.   This is a single patch monitor.  [employer] supplies one patch monitor per enrollment.  Additional stickers are not available.   Please do not apply patch if you will be having a Nuclear Stress Test, Echocardiogram, Cardiac CT, MRI, or Chest Xray during the time frame you would be wearing the monitor. The patch cannot be worn during these tests.  You cannot remove and re-apply the ZIO XT patch monitor.   Your ZIO patch monitor will be sent USPS Priority mail from [facility] directly to your home address. The monitor may also be mailed to a PO BOX if home delivery is not available.   It may take 3-5 days to receive your monitor after you have been enrolled.   Once you have received you monitor, please review enclosed instructions.  Your monitor has already been registered assigning a specific monitor serial # to you.   Applying the monitor   Shave hair from upper left chest.   Hold abrader disc by orange tab.  Rub abrader in 40 strokes over left upper chest as indicated in your monitor instructions.   Clean area with 4 enclosed alcohol pads .  Use all pads to assure are is cleaned thoroughly.  Let dry.   Apply patch as indicated in monitor instructions.  Patch will be place under collarbone on left side of chest with arrow pointing upward.   Rub patch  adhesive wings for 2 minutes.Remove white label marked "1".  Remove white label marked "2".  Rub patch adhesive wings for 2 additional minutes.   While looking in a mirror, press and release button in center of patch.  A small green light will flash 3-4 times .  This will be your only indicator the monitor has been turned on.     Do not shower for the first 24 hours.  You may shower after the first 24 hours.   Press button if you feel a symptom. You will hear a small click.  Record Date, Time and Symptom in the Patient Log Book.   When you are ready to remove patch, follow instructions on last 2 pages of Patient Log Book.  Stick patch monitor onto last page of Patient Log Book.   Place Patient Log Book in [place] box.  Use locking tab on box and tape box closed securely.  The [employer] and [employer] has [employer] on it.  Please place in mailbox as soon as possible.  Your physician should have your test results approximately 7 days after the monitor has been mailed back to [facility].   Call [place] at [phone] if you have questions regarding your ZIO XT patch monitor.  Call them immediately if you see an orange light blinking on your monitor.   If your monitor falls off in less than 4 days contact our Monitor department at [phone].  If your monitor becomes loose or falls off after 4 days call [employer] at [phone] for suggestions on securing your monitor.    Your physician has recommended that you have a sleep study. This test records several body functions during sleep, including: brain activity, eye movement, oxygen and carbon dioxide blood levels, heart rate and rhythm, breathing rate and rhythm, the flow of air through your mouth and nose, snoring, body muscle movements, and chest and belly movement.   Follow-Up: At [facility], you and your health needs are our priority.  As part of our continuing mission to provide you with exceptional heart care,  we have created designated Provider Care Teams.  These Care Teams include your primary Cardiologist (physician) and Advanced Practice Providers (APPs -  Physician Assistants and Nurse Practitioners) who all work together to provide you with the care you need, when you need it.  We recommend signing up for the patient portal called "MyChart".  Sign up information is provided on this After Visit Summary.  MyChart is used to connect with patients for Virtual Visits (Telemedicine).  Patients are able to view lab/test results, encounter notes, upcoming appointments, etc.  Non-urgent messages can be sent to your provider as well.   To learn more about what you can do with MyChart, go to [email or web address].    Your next appointment:   3 month(s)  The format for your next appointment:   In Person  Provider:   Oswaldo Milian, MD       Signed, [name], MD  12/16/2019 6:01 PM    [place]

## 2019-12-16 ENCOUNTER — Other Ambulatory Visit: Payer: Self-pay

## 2019-12-16 ENCOUNTER — Encounter: Payer: Self-pay | Admitting: Cardiology

## 2019-12-16 ENCOUNTER — Telehealth: Payer: Self-pay | Admitting: *Deleted

## 2019-12-16 ENCOUNTER — Encounter: Payer: Self-pay | Admitting: *Deleted

## 2019-12-16 ENCOUNTER — Ambulatory Visit: Payer: Medicaid Other | Admitting: Cardiology

## 2019-12-16 VITALS — BP 140/80 | HR 72 | Temp 96.9°F | Ht 72.0 in | Wt 278.2 lb

## 2019-12-16 DIAGNOSIS — I10 Essential (primary) hypertension: Secondary | ICD-10-CM

## 2019-12-16 DIAGNOSIS — R0683 Snoring: Secondary | ICD-10-CM | POA: Diagnosis not present

## 2019-12-16 DIAGNOSIS — I4891 Unspecified atrial fibrillation: Secondary | ICD-10-CM

## 2019-12-16 DIAGNOSIS — Z7689 Persons encountering health services in other specified circumstances: Secondary | ICD-10-CM | POA: Diagnosis not present

## 2019-12-16 DIAGNOSIS — R079 Chest pain, unspecified: Secondary | ICD-10-CM

## 2019-12-16 DIAGNOSIS — Z720 Tobacco use: Secondary | ICD-10-CM

## 2019-12-16 MED ORDER — METOPROLOL TARTRATE 100 MG PO TABS
ORAL_TABLET | ORAL | 0 refills | Status: DC
Start: 2019-12-16 — End: 2019-12-16

## 2019-12-16 MED ORDER — ROSUVASTATIN CALCIUM 10 MG PO TABS
10.0000 mg | ORAL_TABLET | Freq: Every day | ORAL | 3 refills | Status: DC
Start: 2019-12-16 — End: 2020-12-14

## 2019-12-16 MED ORDER — METOPROLOL TARTRATE 25 MG PO TABS
25.0000 mg | ORAL_TABLET | Freq: Two times a day (BID) | ORAL | 3 refills | Status: DC
Start: 2019-12-16 — End: 2021-01-28

## 2019-12-16 NOTE — Patient Instructions (Addendum)
Medication Instructions:  START rosuvastatin (Crestor) 10 mg daily START metoprolol tartrate (Lopressor) 25 mg two times daily  *If you need a refill on your cardiac medications before your next appointment, please call your pharmacy*   Lab Work: BMET today  If you have labs (blood work) drawn today and your tests are completely normal, you will receive your results only by:  [place] (if you have MyChart) OR  A paper copy in the mail If you have any lab test that is abnormal or we need to change your treatment, we will call you to review the results.   Testing/Procedures: Your physician has requested that you have cardiac CT. Cardiac computed tomography (CT) is a painless test that uses an x-ray machine to take clear, detailed pictures of your heart. For further information please visit HugeFiesta.tn. Please follow instruction sheet as given.    ZIO XT- Long Term Monitor Instructions   Your physician has requested you wear your ZIO patch monitor 14 days.   This is a single patch monitor.  [employer] supplies one patch monitor per enrollment.  Additional stickers are not available.   Please do not apply patch if you will be having a Nuclear Stress Test, Echocardiogram, Cardiac CT, MRI, or Chest Xray during the time frame you would be wearing the monitor. The patch cannot be worn during these tests.  You cannot remove and re-apply the ZIO XT patch monitor.   Your ZIO patch monitor will be sent USPS Priority mail from [facility] directly to your home address. The monitor may also be mailed to a PO BOX if home delivery is not available.   It may take 3-5 days to receive your monitor after you have been enrolled.   Once you have received you monitor, please review enclosed instructions.  Your monitor has already been registered assigning a specific monitor serial # to you.   Applying the monitor   Shave hair from upper left chest.   Hold abrader disc by orange  tab.  Rub abrader in 40 strokes over left upper chest as indicated in your monitor instructions.   Clean area with 4 enclosed alcohol pads .  Use all pads to assure are is cleaned thoroughly.  Let dry.   Apply patch as indicated in monitor instructions.  Patch will be place under collarbone on left side of chest with arrow pointing upward.   Rub patch adhesive wings for 2 minutes.Remove white label marked "1".  Remove white label marked "2".  Rub patch adhesive wings for 2 additional minutes.   While looking in a mirror, press and release button in center of patch.  A small green light will flash 3-4 times .  This will be your only indicator the monitor has been turned on.     Do not shower for the first 24 hours.  You may shower after the first 24 hours.   Press button if you feel a symptom. You will hear a small click.  Record Date, Time and Symptom in the Patient Log Book.   When you are ready to remove patch, follow instructions on last 2 pages of Patient Log Book.  Stick patch monitor onto last page of Patient Log Book.   Place Patient Log Book in [place] box.  Use locking tab on box and tape box closed securely.  The [employer] has [employer] on it.  Please place in mailbox as soon as possible.  Your physician should have your test  results approximately 7 days after the monitor has been mailed back to [facility].   Call [place] at [phone] if you have questions regarding your ZIO XT patch monitor.  Call them immediately if you see an orange light blinking on your monitor.   If your monitor falls off in less than 4 days contact our Monitor department at [phone].  If your monitor becomes loose or falls off after 4 days call [employer] at [phone] for suggestions on securing your monitor.    Your physician has recommended that you have a sleep study. This test records several body functions during sleep, including: brain activity, eye  movement, oxygen and carbon dioxide blood levels, heart rate and rhythm, breathing rate and rhythm, the flow of air through your mouth and nose, snoring, body muscle movements, and chest and belly movement.   Follow-Up: At [facility], you and your health needs are our priority.  As part of our continuing mission to provide you with exceptional heart care, we have created designated Provider Care Teams.  These Care Teams include your primary Cardiologist (physician) and Advanced Practice Providers (APPs -  Physician Assistants and Nurse Practitioners) who all work together to provide you with the care you need, when you need it.  We recommend signing up for the patient portal called "MyChart".  Sign up information is provided on this After Visit Summary.  MyChart is used to connect with patients for Virtual Visits (Telemedicine).  Patients are able to view lab/test results, encounter notes, upcoming appointments, etc.  Non-urgent messages can be sent to your provider as well.   To learn more about what you can do with MyChart, go to [email or web address].    Your next appointment:   3 month(s)  The format for your next appointment:   In Person  Provider:   Oswaldo Milian, MD

## 2019-12-16 NOTE — Telephone Encounter (Signed)
Melissa at [facility] called about auth needed for VAS [place] for pt. I transferred call to Hinton Dyer C to handle.

## 2019-12-16 NOTE — Progress Notes (Signed)
Patient ID: Rhonda Hickman, female   DOB: Aug 13, 1955, 64 y.o.   MRN: [id number] Patient enrolled for [employer] to ship a 14 day ZIO XT long term holter monitor to her home.

## 2019-12-17 ENCOUNTER — Observation Stay (HOSPITAL_COMMUNITY): Payer: Medicaid Other

## 2019-12-17 ENCOUNTER — Inpatient Hospital Stay (HOSPITAL_COMMUNITY)
Admission: EM | Admit: 2019-12-17 | Discharge: 2019-12-19 | DRG: 641 | Disposition: A | Discharge status: Home / Self Care | Payer: Medicaid Other | Attending: Family Medicine | Admitting: Family Medicine

## 2019-12-17 ENCOUNTER — Telehealth: Payer: Self-pay | Admitting: Cardiology

## 2019-12-17 ENCOUNTER — Other Ambulatory Visit: Payer: Self-pay

## 2019-12-17 ENCOUNTER — Telehealth: Payer: Self-pay | Admitting: Pharmacist

## 2019-12-17 ENCOUNTER — Encounter (HOSPITAL_COMMUNITY): Payer: Self-pay | Admitting: Emergency Medicine

## 2019-12-17 ENCOUNTER — Other Ambulatory Visit: Payer: Self-pay | Admitting: *Deleted

## 2019-12-17 DIAGNOSIS — N289 Disorder of kidney and ureter, unspecified: Secondary | ICD-10-CM

## 2019-12-17 DIAGNOSIS — Z6837 Body mass index (BMI) 37.0-37.9, adult: Secondary | ICD-10-CM

## 2019-12-17 DIAGNOSIS — E875 Hyperkalemia: Principal | ICD-10-CM | POA: Diagnosis present

## 2019-12-17 DIAGNOSIS — F419 Anxiety disorder, unspecified: Secondary | ICD-10-CM | POA: Diagnosis present

## 2019-12-17 DIAGNOSIS — K219 Gastro-esophageal reflux disease without esophagitis: Secondary | ICD-10-CM | POA: Diagnosis present

## 2019-12-17 DIAGNOSIS — I679 Cerebrovascular disease, unspecified: Secondary | ICD-10-CM | POA: Diagnosis present

## 2019-12-17 DIAGNOSIS — R32 Unspecified urinary incontinence: Secondary | ICD-10-CM | POA: Diagnosis present

## 2019-12-17 DIAGNOSIS — Z833 Family history of diabetes mellitus: Secondary | ICD-10-CM

## 2019-12-17 DIAGNOSIS — N179 Acute kidney failure, unspecified: Secondary | ICD-10-CM

## 2019-12-17 DIAGNOSIS — E785 Hyperlipidemia, unspecified: Secondary | ICD-10-CM | POA: Diagnosis present

## 2019-12-17 DIAGNOSIS — R269 Unspecified abnormalities of gait and mobility: Secondary | ICD-10-CM

## 2019-12-17 DIAGNOSIS — Z794 Long term (current) use of insulin: Secondary | ICD-10-CM

## 2019-12-17 DIAGNOSIS — Z20822 Contact with and (suspected) exposure to covid-19: Secondary | ICD-10-CM | POA: Diagnosis present

## 2019-12-17 DIAGNOSIS — I10 Essential (primary) hypertension: Secondary | ICD-10-CM | POA: Diagnosis present

## 2019-12-17 DIAGNOSIS — F319 Bipolar disorder, unspecified: Secondary | ICD-10-CM | POA: Diagnosis present

## 2019-12-17 DIAGNOSIS — R9431 Abnormal electrocardiogram [ECG] [EKG]: Secondary | ICD-10-CM | POA: Diagnosis not present

## 2019-12-17 DIAGNOSIS — G4733 Obstructive sleep apnea (adult) (pediatric): Secondary | ICD-10-CM | POA: Diagnosis present

## 2019-12-17 DIAGNOSIS — E872 Acidosis: Secondary | ICD-10-CM | POA: Diagnosis present

## 2019-12-17 DIAGNOSIS — Z7901 Long term (current) use of anticoagulants: Secondary | ICD-10-CM

## 2019-12-17 DIAGNOSIS — F17210 Nicotine dependence, cigarettes, uncomplicated: Secondary | ICD-10-CM | POA: Diagnosis present

## 2019-12-17 DIAGNOSIS — J449 Chronic obstructive pulmonary disease, unspecified: Secondary | ICD-10-CM | POA: Diagnosis present

## 2019-12-17 DIAGNOSIS — Z7951 Long term (current) use of inhaled steroids: Secondary | ICD-10-CM

## 2019-12-17 DIAGNOSIS — R202 Paresthesia of skin: Secondary | ICD-10-CM | POA: Diagnosis present

## 2019-12-17 DIAGNOSIS — R42 Dizziness and giddiness: Secondary | ICD-10-CM

## 2019-12-17 DIAGNOSIS — J45909 Unspecified asthma, uncomplicated: Secondary | ICD-10-CM | POA: Diagnosis present

## 2019-12-17 DIAGNOSIS — F1410 Cocaine abuse, uncomplicated: Secondary | ICD-10-CM | POA: Diagnosis present

## 2019-12-17 DIAGNOSIS — Z8673 Personal history of transient ischemic attack (TIA), and cerebral infarction without residual deficits: Secondary | ICD-10-CM

## 2019-12-17 DIAGNOSIS — Z03818 Encounter for observation for suspected exposure to other biological agents ruled out: Secondary | ICD-10-CM | POA: Diagnosis not present

## 2019-12-17 DIAGNOSIS — E119 Type 2 diabetes mellitus without complications: Secondary | ICD-10-CM

## 2019-12-17 DIAGNOSIS — E66813 Obesity, class 3: Secondary | ICD-10-CM | POA: Diagnosis present

## 2019-12-17 DIAGNOSIS — Z8249 Family history of ischemic heart disease and other diseases of the circulatory system: Secondary | ICD-10-CM

## 2019-12-17 DIAGNOSIS — E1165 Type 2 diabetes mellitus with hyperglycemia: Secondary | ICD-10-CM | POA: Diagnosis present

## 2019-12-17 DIAGNOSIS — I482 Chronic atrial fibrillation, unspecified: Secondary | ICD-10-CM | POA: Diagnosis present

## 2019-12-17 DIAGNOSIS — E1142 Type 2 diabetes mellitus with diabetic polyneuropathy: Secondary | ICD-10-CM | POA: Diagnosis present

## 2019-12-17 DIAGNOSIS — Z79899 Other long term (current) drug therapy: Secondary | ICD-10-CM

## 2019-12-17 LAB — CBC WITH DIFFERENTIAL/PLATELET
Abs Immature Granulocytes: 0.01 10*3/uL (ref 0.00–0.07)
Basophils Absolute: 0 10*3/uL (ref 0.0–0.1)
Basophils Relative: 1 %
Eosinophils Absolute: 0.2 10*3/uL (ref 0.0–0.5)
Eosinophils Relative: 4 %
HCT: 37.6 % (ref 36.0–46.0)
Hemoglobin: 12 g/dL (ref 12.0–15.0)
Immature Granulocytes: 0 %
Lymphocytes Relative: 49 %
Lymphs Abs: 3.3 10*3/uL (ref 0.7–4.0)
MCH: 28.2 pg (ref 26.0–34.0)
MCHC: 31.9 g/dL (ref 30.0–36.0)
MCV: 88.3 fL (ref 80.0–100.0)
Monocytes Absolute: 0.6 10*3/uL (ref 0.1–1.0)
Monocytes Relative: 8 %
Neutro Abs: 2.5 10*3/uL (ref 1.7–7.7)
Neutrophils Relative %: 38 %
Platelets: 239 10*3/uL (ref 150–400)
RBC: 4.26 MIL/uL (ref 3.87–5.11)
RDW: 15.9 % — ABNORMAL HIGH (ref 11.5–15.5)
WBC: 6.6 10*3/uL (ref 4.0–10.5)
nRBC: 0 % (ref 0.0–0.2)

## 2019-12-17 LAB — CREATININE, URINE, RANDOM: Creatinine, Urine: 128.15 mg/dL

## 2019-12-17 LAB — BASIC METABOLIC PANEL
Anion gap: 10 (ref 5–15)
BUN/Creatinine Ratio: 10 — ABNORMAL LOW (ref 12–28)
BUN: 17 mg/dL (ref 8–27)
BUN: 23 mg/dL (ref 8–23)
CO2: 15 mmol/L — ABNORMAL LOW (ref 20–29)
CO2: 20 mmol/L — ABNORMAL LOW (ref 22–32)
Calcium: 9.8 mg/dL (ref 8.7–10.3)
Calcium: 9.5 mg/dL (ref 8.9–10.3)
Chloride: 105 mmol/L (ref 96–106)
Chloride: 104 mmol/L (ref 98–111)
Creatinine, Ser: 1.75 mg/dL — ABNORMAL HIGH (ref 0.57–1.00)
Creatinine, Ser: 1.78 mg/dL — ABNORMAL HIGH (ref 0.44–1.00)
GFR calc Af Amer: 35 mL/min/{1.73_m2} — ABNORMAL LOW (ref >59)
GFR calc Af Amer: 35 mL/min — ABNORMAL LOW (ref >60)
GFR calc non Af Amer: 31 mL/min/{1.73_m2} — ABNORMAL LOW (ref >59)
GFR calc non Af Amer: 30 mL/min — ABNORMAL LOW (ref >60)
Glucose, Bld: 175 mg/dL — ABNORMAL HIGH (ref 70–99)
Glucose: 84 mg/dL (ref 65–99)
Potassium: 6.8 mmol/L (ref 3.5–5.2)
Potassium: 6.8 mmol/L (ref 3.5–5.1)
Sodium: 133 mmol/L — ABNORMAL LOW (ref 134–144)
Sodium: 134 mmol/L — ABNORMAL LOW (ref 135–145)

## 2019-12-17 LAB — URINALYSIS, ROUTINE W REFLEX MICROSCOPIC
Bilirubin Urine: NEGATIVE
Glucose, UA: NEGATIVE mg/dL
Hgb urine dipstick: NEGATIVE
Ketones, ur: NEGATIVE mg/dL
Leukocytes,Ua: NEGATIVE
Nitrite: NEGATIVE
Protein, ur: NEGATIVE mg/dL
Specific Gravity, Urine: 1.013 (ref 1.005–1.030)
pH: 5 (ref 5.0–8.0)

## 2019-12-17 LAB — CBG MONITORING, ED: Glucose-Capillary: 128 mg/dL — ABNORMAL HIGH (ref 70–99)

## 2019-12-17 LAB — SARS CORONAVIRUS 2 BY RT PCR (HOSPITAL ORDER, PERFORMED IN ~~LOC~~ HOSPITAL LAB): SARS Coronavirus 2: NEGATIVE

## 2019-12-17 LAB — GLUCOSE, CAPILLARY: Glucose-Capillary: 161 mg/dL — ABNORMAL HIGH (ref 70–99)

## 2019-12-17 LAB — MAGNESIUM: Magnesium: 2 mg/dL (ref 1.7–2.4)

## 2019-12-17 LAB — SODIUM, URINE, RANDOM: Sodium, Ur: 84 mmol/L

## 2019-12-17 MED ORDER — INSULIN ASPART 100 UNIT/ML ~~LOC~~ SOLN
0.0000 [IU] | Freq: Three times a day (TID) | SUBCUTANEOUS | Status: DC
  Administered 2019-12-17 – 2019-12-18 (×2): 2 [IU] via SUBCUTANEOUS
  Administered 2019-12-18 – 2019-12-19 (×3): 3 [IU] via SUBCUTANEOUS
  Filled 2019-12-17: qty 1

## 2019-12-17 MED ORDER — RIVAROXABAN 20 MG PO TABS
20.0000 mg | ORAL_TABLET | Freq: Every day | ORAL | Status: DC
  Administered 2019-12-18: 20 mg via ORAL
  Filled 2019-12-17 (×2): qty 1

## 2019-12-17 MED ORDER — ONDANSETRON HCL 4 MG/2ML IJ SOLN: 4.0000 mg | Freq: Four times a day (QID) | INTRAMUSCULAR | Status: DC | PRN

## 2019-12-17 MED ORDER — INSULIN ASPART 100 UNIT/ML ~~LOC~~ SOLN: 0.0000 [IU] | Freq: Every day | SUBCUTANEOUS | Status: DC

## 2019-12-17 MED ORDER — LACTATED RINGERS IV SOLN: INTRAVENOUS | Status: DC

## 2019-12-17 MED ORDER — ONDANSETRON HCL 4 MG PO TABS: 4.0000 mg | ORAL_TABLET | Freq: Four times a day (QID) | ORAL | Status: DC | PRN

## 2019-12-17 MED ORDER — OXYBUTYNIN CHLORIDE 5 MG PO TABS
5.0000 mg | ORAL_TABLET | Freq: Two times a day (BID) | ORAL | Status: DC
  Administered 2019-12-17 – 2019-12-19 (×4): 5 mg via ORAL
  Filled 2019-12-17 (×7): qty 1

## 2019-12-17 MED ORDER — IPRATROPIUM-ALBUTEROL 0.5-2.5 (3) MG/3ML IN SOLN: 3.0000 mL | Freq: Four times a day (QID) | RESPIRATORY_TRACT | Status: DC | PRN

## 2019-12-17 MED ORDER — INSULIN GLARGINE 100 UNIT/ML ~~LOC~~ SOLN
10.0000 [IU] | Freq: Every day | SUBCUTANEOUS | Status: DC
  Administered 2019-12-18 – 2019-12-19 (×2): 10 [IU] via SUBCUTANEOUS
  Filled 2019-12-17 (×4): qty 0.1

## 2019-12-17 MED ORDER — ACETAMINOPHEN 325 MG PO TABS
650.0000 mg | ORAL_TABLET | Freq: Four times a day (QID) | ORAL | Status: DC | PRN
  Administered 2019-12-19: 650 mg via ORAL
  Filled 2019-12-17: qty 2

## 2019-12-17 MED ORDER — MOMETASONE FURO-FORMOTEROL FUM 200-5 MCG/ACT IN AERO
2.0000 | INHALATION_SPRAY | Freq: Two times a day (BID) | RESPIRATORY_TRACT | Status: DC
  Administered 2019-12-18 – 2019-12-19 (×3): 2 via RESPIRATORY_TRACT
  Filled 2019-12-17: qty 8.8

## 2019-12-17 MED ORDER — ROSUVASTATIN CALCIUM 10 MG PO TABS
10.0000 mg | ORAL_TABLET | Freq: Every day | ORAL | Status: DC
  Administered 2019-12-18 – 2019-12-19 (×2): 10 mg via ORAL
  Filled 2019-12-17 (×4): qty 1

## 2019-12-17 MED ORDER — DIVALPROEX SODIUM ER 500 MG PO TB24
500.0000 mg | ORAL_TABLET | Freq: Every day | ORAL | Status: DC
  Administered 2019-12-18 – 2019-12-19 (×2): 500 mg via ORAL
  Filled 2019-12-17 (×2): qty 1

## 2019-12-17 MED ORDER — CITALOPRAM HYDROBROMIDE 20 MG PO TABS
40.0000 mg | ORAL_TABLET | Freq: Every day | ORAL | Status: DC
  Administered 2019-12-18 – 2019-12-19 (×2): 40 mg via ORAL
  Filled 2019-12-17 (×4): qty 2

## 2019-12-17 MED ORDER — DIVALPROEX SODIUM ER 500 MG PO TB24
1000.0000 mg | ORAL_TABLET | Freq: Every day | ORAL | Status: DC
  Administered 2019-12-17 – 2019-12-18 (×2): 1000 mg via ORAL
  Filled 2019-12-17 (×2): qty 2

## 2019-12-17 MED ORDER — ACETAMINOPHEN 650 MG RE SUPP: 650.0000 mg | Freq: Four times a day (QID) | RECTAL | Status: DC | PRN

## 2019-12-17 MED ORDER — FERROUS SULFATE 325 (65 FE) MG PO TABS
324.0000 mg | ORAL_TABLET | Freq: Every day | ORAL | Status: DC
  Administered 2019-12-18: 324 mg via ORAL
  Administered 2019-12-19: 325 mg via ORAL
  Filled 2019-12-17 (×2): qty 1

## 2019-12-17 MED ORDER — METOPROLOL TARTRATE 50 MG PO TABS
25.0000 mg | ORAL_TABLET | Freq: Two times a day (BID) | ORAL | Status: DC
  Administered 2019-12-17: 25 mg via ORAL
  Filled 2019-12-17 (×5): qty 1

## 2019-12-17 MED ORDER — AMLODIPINE BESYLATE 5 MG PO TABS
10.0000 mg | ORAL_TABLET | Freq: Every day | ORAL | Status: DC
  Filled 2019-12-17 (×2): qty 2

## 2019-12-17 MED ORDER — SODIUM ZIRCONIUM CYCLOSILICATE 10 G PO PACK
10.0000 g | PACK | Freq: Every day | ORAL | Status: DC
  Administered 2019-12-17: 10 g via ORAL
  Filled 2019-12-17: qty 2
  Filled 2019-12-17: qty 1

## 2019-12-17 MED ORDER — GABAPENTIN 300 MG PO CAPS
900.0000 mg | ORAL_CAPSULE | Freq: Three times a day (TID) | ORAL | Status: DC
  Administered 2019-12-17: 900 mg via ORAL
  Filled 2019-12-17 (×2): qty 3

## 2019-12-17 NOTE — ED Provider Notes (Signed)
[facility] EMERGENCY DEPARTMENT Provider Note   CSN: [id number] Arrival date & time: 12/17/19  4128     History Chief Complaint  Patient presents with  . Abnormal Lab    Rhonda Hickman is a 64 y.o. female.  HPI      Rhonda Hickman is a 64 y.o. female with past medical history of anemia, anxiety, atrial fibrillation, bipolar disorder, COPD, CVA and diabetes who presents to the Emergency Department for evaluation of hyperkalemia.  She had an initial visit with her cardiologist yesterday and labs were performed and she was found to have a potassium of 6.8 and renal insufficiency.  She was advised to come to the emergency department for further evaluation.  Patient complains of generalized weakness, but states this is her baseline.  She denies any chest pain, shortness of breath, or palpitations.  No recent fever or chills, cough or recent illness.  She does state the cardiologist added a new medication yesterday which is metoprolol 25 mg twice daily.  She was started on Xarelto last month after being hospitalized in [place] for an AKI, UTI and she was found to have atrial fibrillation.  She is scheduled to wear a Zio patch monitor for 14 days.   Cardiology: Dr. Beatrix Fetters PCP: [name], NP   Past Medical History:  Diagnosis Date  . Anemia   . Anxiety   . Asthma   . Atrial fibrillation ([place])   . Bipolar 1 disorder ([place])   . Bulging lumbar disc   . Chronic pain of left knee   . COPD (chronic obstructive pulmonary disease) ([place])   . CVA (cerebral vascular accident) ([place])   . Diabetes mellitus   . Neuropathy   . TIA (transient ischemic attack)     Patient Active Problem List   Diagnosis Date Noted  . Chronic atrial fibrillation ([place]) 12/03/2019  . TIA (transient ischemic attack) 12/03/2019  . Diabetic peripheral neuropathy ([place]) 12/03/2019  . Chronic migraine 12/03/2019  . Vertigo 08/03/2019  . Slurred speech 08/03/2019  . Leukocytosis 08/03/2019  .  Bronchiectasis with acute exacerbation ([place])   . Obesity, Class III, BMI 40-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) ([place])   . Gastroesophageal reflux disease   . Asthma exacerbation 06/06/2018  . Gait abnormality 08/29/2017  . Urinary incontinence 08/29/2017  . Paresthesia 08/29/2017  . Derangement of posterior horn of medial meniscus of right knee   . Meniscus, lateral, derangement, right   . Arthritis of knee, right   . COPD with acute exacerbation ([place]) 03/09/2016  . Type 2 diabetes mellitus ([place]) 02/16/2007  . Cocaine abuse ([place]) 02/16/2007  . Extrinsic asthma 02/16/2007  . HOMELESSNESS, HX OF 02/16/2007    Past Surgical History:  Procedure Laterality Date  . ABDOMINAL HYSTERECTOMY    . CARPAL TUNNEL RELEASE Right   . KNEE ARTHROSCOPY WITH MEDIAL MENISECTOMY Right 04/14/2016   Procedure: KNEE ARTHROSCOPY WITH MEDIAL AND LATERAL MENISECTOMY, MICROFRACTURE REPAIR;  Surgeon: Carole Civil, MD;  Location: [facility];  Service: Orthopedics;  Laterality: Right;  lateral menisectomy - needs crutch training     OB History    Gravida      Para      Term      Preterm      AB      Living  0     SAB      TAB      Ectopic      Multiple      Live Births  Family History  Problem Relation Age of Onset  . Diabetes Mother   . Heart attack Father     Social History   Tobacco Use  . Smoking status: Current Some Day Smoker    Packs/day: 0.50    Types: Cigarettes  . Smokeless tobacco: Never Used  Substance Use Topics  . Alcohol use: No  . Drug use: No    Home Medications Prior to Admission medications   Medication Sig Start Date End Date Taking? Authorizing Provider  albuterol (VENTOLIN HFA) 108 (90 Base) MCG/ACT inhaler Inhale 2 puffs into the lungs every 6 (six) hours as needed for wheezing or shortness of breath. 09/15/19   [name], NP  amLODipine (NORVASC) 10 MG tablet Take 1 tablet (10 mg total) by mouth daily. 09/15/19   [name], NP    budesonide-formoterol (SYMBICORT) 160-4.5 MCG/ACT inhaler Inhale 2 puffs into the lungs 2 (two) times daily. 09/15/19   [name], NP  chlorhexidine (PERIDEX) 0.12 % solution RINSE MOUTH WITH 1 CAPFULLX30 SECONDS MORNING AND EVENING AFTER BRUSHING. THEN SPIT, NO SWALLOW 11/19/19   [provider]  citalopram (CELEXA) 40 MG tablet Take 40 mg by mouth daily. 12/02/19   [provider]  diclofenac (VOLTAREN) 50 MG EC tablet Take 50 mg by mouth 2 (two) times daily.    [provider]  divalproex (DEPAKOTE ER) 500 MG 24 hr tablet Take 500-1,000 mg by mouth See admin instructions. 500MG  in the morning and 1000mg  at bedtime 03/26/18   [provider]  ferrous sulfate 324 MG TBEC Take 1 tablet (324 mg total) by mouth daily with breakfast. 09/15/19   [name], NP  gabapentin (NEURONTIN) 300 MG capsule Take 3 capsules (900 mg total) by mouth 3 (three) times daily. 12/03/19   Marcial Pacas, MD  insulin glargine (LANTUS SOLOSTAR) 100 UNIT/ML Solostar Pen Inject 20 Units into the skin daily. 09/15/19   [name], NP  lisinopril (ZESTRIL) 20 MG tablet Take 1 tablet (20 mg total) by mouth daily. 09/15/19   [name], NP  metFORMIN (GLUCOPHAGE) 1000 MG tablet Take 1 tablet (1,000 mg total) by mouth 2 (two) times daily with a meal. 09/15/19   [name], NP  metoprolol tartrate (LOPRESSOR) 25 MG tablet Take 1 tablet (25 mg total) by mouth 2 (two) times daily. 12/16/19 03/15/20  [name], MD  oxybutynin (DITROPAN) 5 MG tablet Take 5 mg by mouth 2 (two) times daily. 10/21/19   [provider]  penicillin v potassium (VEETID) 500 MG tablet Take 500 mg by mouth 4 (four) times daily. 11/19/19   [provider]  rivaroxaban (XARELTO) 20 MG TABS tablet Take 1 tablet (20 mg total) by mouth daily with supper. 11/28/19   [name], NP  rosuvastatin (CRESTOR) 10 MG tablet Take 1 tablet (10 mg total) by mouth daily. 12/16/19  03/15/20  [name], MD  sitaGLIPtin (JANUVIA) 100 MG tablet Take 1 tablet (100 mg total) by mouth daily. 11/28/19   [name], NP  traMADol (ULTRAM) 50 MG tablet Take 1 tablet (50 mg total) by mouth every 6 (six) hours as needed. 08/21/19   Carole Civil, MD  Vitamin D, Ergocalciferol, (DRISDOL) 1.25 MG (50000 UNIT) CAPS capsule Take 1 capsule (50,000 Units total) by mouth every 7 (seven) days. 11/28/19   [name], NP    Allergies    Aspirin  Review of Systems   Review of Systems  Constitutional: Negative.   HENT: Negative for ear pain and sore throat.   Eyes: Negative.   Respiratory: Negative for cough and shortness of breath.   Cardiovascular: Negative for chest pain.  Gastrointestinal: Negative for abdominal pain, nausea and vomiting.  Genitourinary: Negative for dysuria, frequency and hematuria.  Musculoskeletal: Negative for back pain and neck pain.  Skin: Negative for rash.  Neurological: Negative for dizziness and headaches.  Hematological: Does not bruise/bleed easily.  Psychiatric/Behavioral: The patient is not nervous/anxious.     Physical Exam Updated Vital Signs BP 100/66 (BP Location: Right Arm)   Pulse 80   Temp 98.5 F (36.9 C) (Oral)   Resp 18   Ht 6' (1.829 m)   Wt 126.1 kg   SpO2 95%   BMI 37.70 kg/m   Physical Exam HENT:     Head: Normocephalic and atraumatic.  Eyes:     Pupils: Pupils are equal, round, and reactive to light.  Cardiovascular:     Rate and Rhythm: Normal rate and regular rhythm.     Heart sounds: Normal heart sounds.  Pulmonary:     Effort: Pulmonary effort is normal.     Breath sounds: Normal breath sounds.  Abdominal:     Palpations: Abdomen is soft.     Tenderness: There is no abdominal tenderness. There is no guarding or rebound.  Musculoskeletal:        General: No tenderness. Normal range of motion.     Cervical back: Normal range of motion and neck supple.  Lymphadenopathy:      Cervical: No cervical adenopathy.  Skin:    General: Skin is warm and dry.  Neurological:     Mental Status: She is alert and oriented to person, place, and time.  Psychiatric:        Judgment: Judgment normal.     ED Results / Procedures / Treatments   Labs (all labs ordered are listed, but only abnormal results are displayed) Labs Reviewed  BASIC METABOLIC PANEL - Abnormal; Notable for the following components:      Result Value   Sodium 134 (*)    Potassium 6.8 (*)    CO2 20 (*)    Glucose, Bld 175 (*)    Creatinine, Ser 1.78 (*)    GFR calc non Af Amer 30 (*)    GFR calc Af Amer 35 (*)    All other components within normal limits  CBC WITH DIFFERENTIAL/PLATELET - Abnormal; Notable for the following components:   RDW 15.9 (*)    All other components within normal limits  SARS CORONAVIRUS 2 BY RT PCR (HOSPITAL ORDER, [place] [facility])  MAGNESIUM  URINALYSIS, ROUTINE W REFLEX MICROSCOPIC    EKG EKG Interpretation  Date/Time:  Tuesday December 17 2019 10:16:42 EDT Ventricular Rate:  58 PR Interval:    QRS Duration: 81 QT Interval:  406 QTC Calculation: 399 R Axis:   -18 Text Interpretation: Sinus rhythm Borderline left axis deviation No significant change since last tracing Confirmed by Dorie Rank [phone]) on 12/17/2019 10:24:10 AM   Radiology No results found.  Procedures Procedures (including critical care time)  Medications Ordered in ED Medications  sodium zirconium cyclosilicate (LOKELMA) packet 10 g (10 g Oral Given 12/17/19 1221)    ED Course  I have reviewed the triage vital signs and the nursing notes.  Pertinent labs & imaging results that were available during my care of the patient were reviewed by me and considered in my medical  decision making (see chart for details).    MDM Rules/Calculators/A&P                      Patient here for evaluation of possible hyperkalemia.  She was seen yesterday by her cardiologist and found to  have potassium of 6.8 which is confirmed with testing today.  On exam patient is well-appearing, nontoxic.  Vital signs are reassuring.  She complains of some generalized weakness, without focal neuro deficit.  No headache or dyspnea.   Today's EKG shows a sinus rhythm without acute changes. She was given [facility] here. Of note, patient's creatinine has been steadily increasing for 2 weeks.  She had a normal creatinine 3 months ago.  I will consult hospitalist for admission for her hyperkalemia.  Consulted Dr. Manuella Ghazi who agrees to admit.     Final Clinical Impression(s) / ED Diagnoses Final diagnoses:  Hyperkalemia  Renal insufficiency    Rx / DC Orders ED Discharge Orders    None       Kem Parkinson, PA-C 12/17/19 1433    Dorie Rank, MD 12/17/19 [phone]

## 2019-12-17 NOTE — H&P (Addendum)
History and Physical    Rhonda Hickman WEX:[id number] DOB: 1956/06/21 DOA: 12/17/2019  PCP: [name], NP   Patient coming from: Home  Chief Complaint: Hyperkalemia/abnormal labs  HPI: Rhonda Hickman is a 64 y.o. female with medical history significant for TIA/CVA, atrial fibrillation on Xarelto, hypertension, type 2 diabetes, dyslipidemia, tobacco abuse, obesity, and likely OSA who presented to the ED after recent new cardiology visit that determined that she had severe hyperkalemia as well as AKI.  She is otherwise asymptomatic.  She states that she had all of her teeth pulled 1 month ago and has had poor oral intake.  She has been trying to drink plenty of water and drinking some protein supplement shakes.  She has been taking her usual home medications to include lisinopril daily and has never had problems with it in the past.   ED Course: Vital signs noted be stable and repeat potassium is confirmed at 6.8 with creatinine 1.78 and usual baseline 0.8-0.9.  EKG with no acute changes with sinus rhythm at 50 bpm.  Glucose is 175.  She has been given Lokelma in the ED.  Review of Systems: All others reviewed and otherwise negative.  Past Medical History:  Diagnosis Date  . Anemia   . Anxiety   . Asthma   . Atrial fibrillation ([place])   . Bipolar 1 disorder ([place])   . Bulging lumbar disc   . Chronic pain of left knee   . COPD (chronic obstructive pulmonary disease) ([place])   . CVA (cerebral vascular accident) ([place])   . Diabetes mellitus   . Neuropathy   . TIA (transient ischemic attack)     Past Surgical History:  Procedure Laterality Date  . ABDOMINAL HYSTERECTOMY    . CARPAL TUNNEL RELEASE Right   . KNEE ARTHROSCOPY WITH MEDIAL MENISECTOMY Right 04/14/2016   Procedure: KNEE ARTHROSCOPY WITH MEDIAL AND LATERAL MENISECTOMY, MICROFRACTURE REPAIR;  Surgeon: Carole Civil, MD;  Location: [facility];  Service: Orthopedics;  Laterality: Right;  lateral menisectomy - needs crutch  training     reports that she has been smoking cigarettes. She has been smoking about 0.50 packs per day. She has never used smokeless tobacco. She reports that she does not drink alcohol or use drugs.  Allergies  Allergen Reactions  . Aspirin Nausea Only and Other (See Comments)    Causes stomach pain    Family History  Problem Relation Age of Onset  . Diabetes Mother   . Heart attack Father     Prior to Admission medications   Medication Sig Start Date End Date Taking? Authorizing Provider  albuterol (VENTOLIN HFA) 108 (90 Base) MCG/ACT inhaler Inhale 2 puffs into the lungs every 6 (six) hours as needed for wheezing or shortness of breath. 09/15/19  Yes [name], NP  amLODipine (NORVASC) 10 MG tablet Take 1 tablet (10 mg total) by mouth daily. 09/15/19  Yes [name], NP  budesonide-formoterol (SYMBICORT) 160-4.5 MCG/ACT inhaler Inhale 2 puffs into the lungs 2 (two) times daily. 09/15/19  Yes [name], NP  chlorhexidine (PERIDEX) 0.12 % solution RINSE MOUTH WITH 1 CAPFULLX30 SECONDS MORNING AND EVENING AFTER BRUSHING. THEN SPIT, NO SWALLOW 11/19/19  Yes [provider]  citalopram (CELEXA) 40 MG tablet Take 40 mg by mouth daily. 12/02/19  Yes [provider]  diclofenac (VOLTAREN) 50 MG EC tablet Take 50 mg by mouth 2 (two) times daily.   Yes [provider]  divalproex (DEPAKOTE ER) 500 MG 24  hr tablet Take 500-1,000 mg by mouth See admin instructions. 500MG  in the morning and 1000mg  at bedtime 03/26/18  Yes [provider]  ferrous sulfate 324 MG TBEC Take 1 tablet (324 mg total) by mouth daily with breakfast. 09/15/19  Yes [name], NP  gabapentin (NEURONTIN) 300 MG capsule Take 3 capsules (900 mg total) by mouth 3 (three) times daily. 12/03/19  Yes Marcial Pacas, MD  insulin glargine (LANTUS SOLOSTAR) 100 UNIT/ML Solostar Pen Inject 20 Units into the skin daily. 09/15/19  Yes [name], NP  lisinopril (ZESTRIL)  20 MG tablet Take 1 tablet (20 mg total) by mouth daily. 09/15/19  Yes [name], NP  metFORMIN (GLUCOPHAGE) 1000 MG tablet Take 1 tablet (1,000 mg total) by mouth 2 (two) times daily with a meal. 09/15/19  Yes [name], NP  metoprolol tartrate (LOPRESSOR) 25 MG tablet Take 1 tablet (25 mg total) by mouth 2 (two) times daily. 12/16/19 03/15/20 Yes [name], MD  oxybutynin (DITROPAN) 5 MG tablet Take 5 mg by mouth 2 (two) times daily. 10/21/19  Yes [provider]  penicillin v potassium (VEETID) 500 MG tablet Take 500 mg by mouth 4 (four) times daily. 11/19/19  Yes [provider]  rivaroxaban (XARELTO) 20 MG TABS tablet Take 1 tablet (20 mg total) by mouth daily with supper. 11/28/19  Yes [name], NP  rosuvastatin (CRESTOR) 10 MG tablet Take 1 tablet (10 mg total) by mouth daily. 12/16/19 03/15/20 Yes [name], MD  sitaGLIPtin (JANUVIA) 100 MG tablet Take 1 tablet (100 mg total) by mouth daily. 11/28/19  Yes [name], NP  traMADol (ULTRAM) 50 MG tablet Take 1 tablet (50 mg total) by mouth every 6 (six) hours as needed. 08/21/19  Yes Carole Civil, MD  Vitamin D, Ergocalciferol, (DRISDOL) 1.25 MG (50000 UNIT) CAPS capsule Take 1 capsule (50,000 Units total) by mouth every 7 (seven) days. 11/28/19  Yes [name], NP    Physical Exam: Vitals:   12/17/19 1045 12/17/19 1100 12/17/19 1115 12/17/19 1130  BP:  111/72  130/65  Pulse: 60 (!) 56 (!) 58 (!) 56  Resp: 18 12 15 14   Temp:      TempSrc:      SpO2: 99% 100% 96% 99%  Weight:      Height:        Constitutional: NAD, calm, comfortable, edentulous, obese Vitals:   12/17/19 1045 12/17/19 1100 12/17/19 1115 12/17/19 1130  BP:  111/72  130/65  Pulse: 60 (!) 56 (!) 58 (!) 56  Resp: 18 12 15 14   Temp:      TempSrc:      SpO2: 99% 100% 96% 99%  Weight:      Height:       Eyes: lids and conjunctivae normal ENMT: Mucous membranes are moist.  Neck:  normal, supple Respiratory: clear to auscultation bilaterally. Normal respiratory effort. No accessory muscle use.  Cardiovascular: Regular rate and rhythm, no murmurs. No extremity edema. Abdomen: no tenderness, no distention. Bowel sounds positive.  Musculoskeletal:  No joint deformity upper and lower extremities.   Skin: no rashes, lesions, ulcers.  Psychiatric: Normal judgment and insight. Alert and oriented x 3. Normal mood.   Labs on Admission: I have personally reviewed following labs and imaging studies  CBC: Recent Labs  Lab 12/17/19 1103  WBC 6.6  NEUTROABS 2.5  HGB 12.0  HCT 37.6  MCV 88.3  PLT 239  Basic Metabolic Panel: Recent Labs  Lab 12/16/19 1450 12/17/19 1103  NA 133* 134*  K 6.8* 6.8*  CL 105 104  CO2 15* 20*  GLUCOSE 84 175*  BUN 17 23  CREATININE 1.75* 1.78*  CALCIUM 9.8 9.5  MG  --  2.0   GFR: Estimated Creatinine Clearance: 48.8 mL/min (A) (by C-G formula based on SCr of 1.78 mg/dL (H)). Liver Function Tests: No results for input(s): AST, ALT, ALKPHOS, BILITOT, PROT, ALBUMIN in the last 168 hours. No results for input(s): LIPASE, AMYLASE in the last 168 hours. No results for input(s): AMMONIA in the last 168 hours. Coagulation Profile: No results for input(s): INR, PROTIME in the last 168 hours. Cardiac Enzymes: No results for input(s): CKTOTAL, CKMB, CKMBINDEX, TROPONINI in the last 168 hours. BNP (last 3 results) No results for input(s): PROBNP in the last 8760 hours. HbA1C: No results for input(s): HGBA1C in the last 72 hours. CBG: No results for input(s): GLUCAP in the last 168 hours. Lipid Profile: No results for input(s): CHOL, HDL, LDLCALC, TRIG, CHOLHDL, LDLDIRECT in the last 72 hours. Thyroid Function Tests: No results for input(s): TSH, T4TOTAL, FREET4, T3FREE, THYROIDAB in the last 72 hours. Anemia Panel: No results for input(s): VITAMINB12, FOLATE, FERRITIN, TIBC, IRON, RETICCTPCT in the last 72 hours. Urine analysis:      Component Value Date/Time   COLORURINE YELLOW 12/17/2019 1053   APPEARANCEUR CLEAR 12/17/2019 1053   LABSPEC 1.013 12/17/2019 1053   PHURINE 5.0 12/17/2019 1053   GLUCOSEU NEGATIVE 12/17/2019 1053   [place] 12/17/2019 [place] 12/17/2019 [place] 12/17/2019 1053   PROTEINUR NEGATIVE 12/17/2019 1053   UROBILINOGEN 0.2 09/12/2013 1642   NITRITE NEGATIVE 12/17/2019 1053   LEUKOCYTESUR NEGATIVE 12/17/2019 1053    Radiological Exams on Admission: No results found.  EKG: Independently reviewed.  Sinus rhythm, 58 bpm with no acute changes.  Assessment/Plan Active Problems:   Hyperkalemia    Hyperkalemia -In setting of AKI as well as lisinopril -Lokelma given in ED -Monitor repeat labs in a.m. -Maintain on IV fluid -EKG with no changes  AKI with acidosis -Baseline creatinine 0.8-0.9, currently 1.78 -In setting of likely poor oral intake as well as lisinopril and NSAID use -Plan to maintain on IV fluid -Avoid nephrotoxic agents -Check renal ultrasound and urine studies -Strict I's and O's  History of atrial fibrillation on Xarelto -Maintain on Xarelto for anticoagulation -Continue metoprolol 25 mg p.o. daily -Supposed to have Zio patch monitor  History of COPD/asthma -No acute bronchospasms -Continue Dulera while here -DuoNebs as needed  History of hypertension -Maintain on amlodipine and metoprolol -Hold lisinopril for AKI  History of CVA/TIA -Maintain on statin -Follows with neurology outpatient  Type 2 diabetes -Hold home medications -Blood glucose elevated and will start SSI with carb modified diet -Recent hemoglobin A1c 8.9%  Bipolar disorder -Maintain on home meds  OSA -Being set up for sleep study outpatient  History of tobacco abuse -Encouraged cessation  DVT prophylaxis: Xarelto Code Status: Full Family Communication: None at bedside Disposition Plan: Treatment of AKI as well as hyperkalemia with  IV fluid and Lokelma Consults called: None Admission status: Observation, telemetry Status is: Observation  The patient remains OBS appropriate and will d/c before 2 midnights.  Dispo: The patient is from: Home              Anticipated d/c is to: Home              Anticipated  d/c date is: 1 day              Patient currently is not medically stable to d/c.   Keylah Darwish D Manuella Ghazi DO [facility]  If 7PM-7AM, please contact night-coverage [email or web address]  12/17/2019, 4:09 PM

## 2019-12-17 NOTE — Telephone Encounter (Signed)
[employer] calling with critical lab. [facility] hung up before I was able to transfer call.

## 2019-12-17 NOTE — ED Notes (Signed)
CRITICAL VALUE ALERT  Critical Value:  Potassium 6.8  Date & Time Notied:  12/17/19 & 1125hrs  Provider Notified: Kem Parkinson, PA  Orders Received/Actions taken: notified

## 2019-12-17 NOTE — Telephone Encounter (Signed)
LMOM from [facility] to report critical value.   Please call [phone] Option 1 Reference number [id number]

## 2019-12-17 NOTE — ED Triage Notes (Signed)
Pt states she was sent here for elevated potassium. States she had blood work drawn yesterday. Pt endorses generalized weakness.

## 2019-12-17 NOTE — ED Notes (Signed)
Pt aware we need urine sample.

## 2019-12-17 NOTE — Telephone Encounter (Signed)
[facility] to report critical value.   Please call [phone] Option 1 Reference number [id number]   Called to report K 6.8 from 12/16/19 and pt has already been informed and is being seen currently in the ED at [facility].

## 2019-12-18 ENCOUNTER — Ambulatory Visit (HOSPITAL_COMMUNITY): Payer: Medicaid Other

## 2019-12-18 DIAGNOSIS — F1410 Cocaine abuse, uncomplicated: Secondary | ICD-10-CM

## 2019-12-18 DIAGNOSIS — R269 Unspecified abnormalities of gait and mobility: Secondary | ICD-10-CM

## 2019-12-18 DIAGNOSIS — J449 Chronic obstructive pulmonary disease, unspecified: Secondary | ICD-10-CM | POA: Diagnosis present

## 2019-12-18 DIAGNOSIS — Z6837 Body mass index (BMI) 37.0-37.9, adult: Secondary | ICD-10-CM | POA: Diagnosis not present

## 2019-12-18 DIAGNOSIS — Z7901 Long term (current) use of anticoagulants: Secondary | ICD-10-CM | POA: Diagnosis not present

## 2019-12-18 DIAGNOSIS — Z79899 Other long term (current) drug therapy: Secondary | ICD-10-CM | POA: Diagnosis not present

## 2019-12-18 DIAGNOSIS — I679 Cerebrovascular disease, unspecified: Secondary | ICD-10-CM | POA: Diagnosis present

## 2019-12-18 DIAGNOSIS — E1142 Type 2 diabetes mellitus with diabetic polyneuropathy: Secondary | ICD-10-CM | POA: Diagnosis present

## 2019-12-18 DIAGNOSIS — F17210 Nicotine dependence, cigarettes, uncomplicated: Secondary | ICD-10-CM | POA: Diagnosis present

## 2019-12-18 DIAGNOSIS — E1165 Type 2 diabetes mellitus with hyperglycemia: Secondary | ICD-10-CM | POA: Diagnosis present

## 2019-12-18 DIAGNOSIS — I482 Chronic atrial fibrillation, unspecified: Secondary | ICD-10-CM

## 2019-12-18 DIAGNOSIS — E872 Acidosis: Secondary | ICD-10-CM | POA: Diagnosis present

## 2019-12-18 DIAGNOSIS — E785 Hyperlipidemia, unspecified: Secondary | ICD-10-CM | POA: Diagnosis present

## 2019-12-18 DIAGNOSIS — Z03818 Encounter for observation for suspected exposure to other biological agents ruled out: Secondary | ICD-10-CM | POA: Diagnosis not present

## 2019-12-18 DIAGNOSIS — Z20822 Contact with and (suspected) exposure to covid-19: Secondary | ICD-10-CM | POA: Diagnosis present

## 2019-12-18 DIAGNOSIS — K219 Gastro-esophageal reflux disease without esophagitis: Secondary | ICD-10-CM

## 2019-12-18 DIAGNOSIS — N179 Acute kidney failure, unspecified: Secondary | ICD-10-CM | POA: Diagnosis present

## 2019-12-18 DIAGNOSIS — N289 Disorder of kidney and ureter, unspecified: Secondary | ICD-10-CM | POA: Diagnosis not present

## 2019-12-18 DIAGNOSIS — I10 Essential (primary) hypertension: Secondary | ICD-10-CM | POA: Diagnosis present

## 2019-12-18 DIAGNOSIS — Z8673 Personal history of transient ischemic attack (TIA), and cerebral infarction without residual deficits: Secondary | ICD-10-CM | POA: Diagnosis not present

## 2019-12-18 DIAGNOSIS — F319 Bipolar disorder, unspecified: Secondary | ICD-10-CM | POA: Diagnosis present

## 2019-12-18 DIAGNOSIS — Z7951 Long term (current) use of inhaled steroids: Secondary | ICD-10-CM | POA: Diagnosis not present

## 2019-12-18 DIAGNOSIS — R32 Unspecified urinary incontinence: Secondary | ICD-10-CM | POA: Diagnosis present

## 2019-12-18 DIAGNOSIS — E875 Hyperkalemia: Secondary | ICD-10-CM | POA: Diagnosis not present

## 2019-12-18 DIAGNOSIS — G4733 Obstructive sleep apnea (adult) (pediatric): Secondary | ICD-10-CM | POA: Diagnosis present

## 2019-12-18 DIAGNOSIS — Z794 Long term (current) use of insulin: Secondary | ICD-10-CM | POA: Diagnosis not present

## 2019-12-18 LAB — CBC
HCT: 35.1 % — ABNORMAL LOW (ref 36.0–46.0)
Hemoglobin: 11.1 g/dL — ABNORMAL LOW (ref 12.0–15.0)
MCH: 27.8 pg (ref 26.0–34.0)
MCHC: 31.6 g/dL (ref 30.0–36.0)
MCV: 88 fL (ref 80.0–100.0)
Platelets: 221 10*3/uL (ref 150–400)
RBC: 3.99 MIL/uL (ref 3.87–5.11)
RDW: 15.8 % — ABNORMAL HIGH (ref 11.5–15.5)
WBC: 6.1 10*3/uL (ref 4.0–10.5)
nRBC: 0 % (ref 0.0–0.2)

## 2019-12-18 LAB — BASIC METABOLIC PANEL
Anion gap: 6 (ref 5–15)
Anion gap: 5 (ref 5–15)
BUN: 22 mg/dL (ref 8–23)
BUN: 22 mg/dL (ref 8–23)
CO2: 22 mmol/L (ref 22–32)
CO2: 24 mmol/L (ref 22–32)
Calcium: 9.3 mg/dL (ref 8.9–10.3)
Calcium: 9.5 mg/dL (ref 8.9–10.3)
Chloride: 105 mmol/L (ref 98–111)
Chloride: 106 mmol/L (ref 98–111)
Creatinine, Ser: 1.3 mg/dL — ABNORMAL HIGH (ref 0.44–1.00)
Creatinine, Ser: 1.4 mg/dL — ABNORMAL HIGH (ref 0.44–1.00)
GFR calc Af Amer: 51 mL/min — ABNORMAL LOW (ref >60)
GFR calc Af Amer: 47 mL/min — ABNORMAL LOW (ref >60)
GFR calc non Af Amer: 44 mL/min — ABNORMAL LOW (ref >60)
GFR calc non Af Amer: 40 mL/min — ABNORMAL LOW (ref >60)
Glucose, Bld: 168 mg/dL — ABNORMAL HIGH (ref 70–99)
Glucose, Bld: 127 mg/dL — ABNORMAL HIGH (ref 70–99)
Potassium: 6.2 mmol/L — ABNORMAL HIGH (ref 3.5–5.1)
Potassium: 6.1 mmol/L — ABNORMAL HIGH (ref 3.5–5.1)
Sodium: 133 mmol/L — ABNORMAL LOW (ref 135–145)
Sodium: 135 mmol/L (ref 135–145)

## 2019-12-18 LAB — GLUCOSE, CAPILLARY
Glucose-Capillary: 124 mg/dL — ABNORMAL HIGH (ref 70–99)
Glucose-Capillary: 119 mg/dL — ABNORMAL HIGH (ref 70–99)
Glucose-Capillary: 161 mg/dL — ABNORMAL HIGH (ref 70–99)
Glucose-Capillary: 140 mg/dL — ABNORMAL HIGH (ref 70–99)

## 2019-12-18 LAB — MAGNESIUM: Magnesium: 1.9 mg/dL (ref 1.7–2.4)

## 2019-12-18 MED ORDER — FUROSEMIDE 40 MG PO TABS
40.0000 mg | ORAL_TABLET | Freq: Once | ORAL | Status: AC
  Administered 2019-12-18: 40 mg via ORAL
  Filled 2019-12-18: qty 1

## 2019-12-18 MED ORDER — CALCIUM GLUCONATE-NACL 1-0.675 GM/50ML-% IV SOLN
1.0000 g | Freq: Once | INTRAVENOUS | Status: AC
  Administered 2019-12-18: 1000 mg via INTRAVENOUS
  Filled 2019-12-18: qty 50

## 2019-12-18 MED ORDER — GABAPENTIN 300 MG PO CAPS
300.0000 mg | ORAL_CAPSULE | Freq: Three times a day (TID) | ORAL | Status: DC
  Administered 2019-12-18 – 2019-12-19 (×4): 300 mg via ORAL
  Filled 2019-12-18 (×4): qty 1

## 2019-12-18 MED ORDER — FUROSEMIDE 10 MG/ML IJ SOLN
40.0000 mg | Freq: Once | INTRAMUSCULAR | Status: AC
  Administered 2019-12-18: 40 mg via INTRAVENOUS
  Filled 2019-12-18: qty 4

## 2019-12-18 MED ORDER — INSULIN ASPART 100 UNIT/ML ~~LOC~~ SOLN
3.0000 [IU] | Freq: Three times a day (TID) | SUBCUTANEOUS | Status: DC
  Administered 2019-12-18 – 2019-12-19 (×5): 3 [IU] via SUBCUTANEOUS

## 2019-12-18 MED ORDER — SODIUM ZIRCONIUM CYCLOSILICATE 10 G PO PACK
10.0000 g | PACK | Freq: Three times a day (TID) | ORAL | Status: DC
  Administered 2019-12-18 (×3): 10 g via ORAL
  Filled 2019-12-18 (×2): qty 1

## 2019-12-18 MED ORDER — FUROSEMIDE 10 MG/ML IJ SOLN: 30.0000 mg | Freq: Once | INTRAMUSCULAR | Status: DC

## 2019-12-18 NOTE — Progress Notes (Signed)
PROGRESS NOTE   Rhonda Hickman  ZWC:[id number] DOB: [birth year] DOA: 12/17/2019 PCP: [name], NP   Chief Complaint  Patient presents with  . Abnormal Lab    Brief Admission History:  64 y.o. female with medical history significant for TIA/CVA, atrial fibrillation on Xarelto, hypertension, type 2 diabetes, dyslipidemia, tobacco abuse, obesity, and likely OSA who presented to the ED after recent new cardiology visit that determined that she had severe hyperkalemia as well as AKI.  Assessment & Plan:   Principal Problem:   Hyperkalemia Active Problems:   Type 2 diabetes mellitus (HCC)   Cocaine abuse (HCC)   Extrinsic asthma   Gait abnormality   Urinary incontinence   Paresthesia   Obesity, Class III, BMI 40-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) (HCC)   Gastroesophageal reflux disease   Vertigo   Chronic atrial fibrillation (HCC)   Diabetic peripheral neuropathy (HCC)   AKI (acute kidney injury) ([place])   1. Hyperkalemia-potassium level remains severely elevated.  I have ordered additional Lokelma and added calcium gluconate and IV Lasix to get the potassium levels down.  Recheck BMP later today to follow-up potassium level.  Continue telemetry monitoring.  Continue IV fluids. 2. AKI-creatinine slowly improving with IV fluid hydration avoiding NSAIDs and lisinopril. 3. Chronic A. fib on Xarelto for full anticoagulation. 4. History of asthma and COPD-continue home bronchodilators. 5. Hypertension-stable on metoprolol and amlodipine.  Holding lisinopril for AKI. 6. Type 2 diabetes mellitus-continue SSI coverage and CBG testing poorly controlled as evidenced by hemoglobin A1c of 8.9%. 7. Cerebrovascular disease-continue home statin therapy.  Outpatient neurology follow-up. 8. Bipolar disorder-stable on home meds. 9. OSA will offer inpatient CPAP.  Auto titrated. 10. Tobacco abuse-patient counseled on cessation.  Patient verbalized understanding.  DVT prophylaxis: Xarelto Code Status:  Full Family Communication: Patient updated fully at bedside and verbalizes understanding questions answered Disposition: Home  Status is: Inpatient  Remains inpatient appropriate because:Persistent severe electrolyte disturbances   Dispo: The patient is from: Home              Anticipated d/c is to: Home              Anticipated d/c date is: 1 day              Patient currently is not medically stable to d/c.  Consultants:     Procedures:     Antimicrobials:    Subjective: Pt says she has to urinate after receiving lasix this morning.   Objective: Vitals:   12/18/19 0411 12/18/19 0746 12/18/19 0827 12/18/19 1447  BP: 92/68  (!) 97/58 106/76  Pulse: (!) 51  (!) 52 (!) 55  Resp: 20  20 20   Temp: 98.3 F (36.8 C)  97.9 F (36.6 C) 98.1 F (36.7 C)  TempSrc: Oral  Oral Oral  SpO2: 98% 99% 97% 99%  Weight:      Height:        Intake/Output Summary (Last 24 hours) at 12/18/2019 1632 Last data filed at 12/18/2019 1500 Gross per 24 hour  Intake 1882.69 ml  Output 4 ml  Net 1878.69 ml   Filed Weights   12/17/19 0920 12/17/19 2038  Weight: 126.1 kg 127.7 kg    Examination:  General exam: Appears calm and comfortable  Respiratory system: Clear to auscultation. Respiratory effort normal. Cardiovascular system: S1 & S2 heard, RRR. No JVD, murmurs, rubs, gallops or clicks. No pedal edema. Gastrointestinal system: Abdomen is nondistended, soft and nontender. No organomegaly or masses felt. Normal bowel sounds  heard. Central nervous system: Alert and oriented. No focal neurological deficits. Extremities: Symmetric 5 x 5 power. Skin: No rashes, lesions or ulcers Psychiatry: Judgement and insight appear normal. Mood & affect appropriate.   Data Reviewed: I have personally reviewed following labs and imaging studies  CBC: Recent Labs  Lab 12/17/19 1103 12/18/19 0553  WBC 6.6 6.1  NEUTROABS 2.5  --   HGB 12.0 11.1*  HCT 37.6 35.1*  MCV 88.3 88.0  PLT 239 221     Basic Metabolic Panel: Recent Labs  Lab 12/16/19 1450 12/17/19 1103 12/18/19 0553 12/18/19 1355  NA 133* 134* 133* 135  K 6.8* 6.8* 6.2* 6.1*  CL 105 104 105 106  CO2 15* 20* 22 24  GLUCOSE 84 175* 168* 127*  BUN 17 23 22 22   CREATININE 1.75* 1.78* 1.30* 1.40*  CALCIUM 9.8 9.5 9.3 9.5  MG  --  2.0 1.9  --     GFR: Estimated Creatinine Clearance: 62.4 mL/min (A) (by C-G formula based on SCr of 1.4 mg/dL (H)).  Liver Function Tests: No results for input(s): AST, ALT, ALKPHOS, BILITOT, PROT, ALBUMIN in the last 168 hours.  CBG: Recent Labs  Lab 12/17/19 1715 12/17/19 2303 12/18/19 0750 12/18/19 1131  GLUCAP 128* 161* 124* 119*    Recent Results (from the past 240 hour(s))  SARS Coronavirus 2 by RT PCR (hospital order, performed in [facility] hospital lab) Nasopharyngeal Nasopharyngeal Swab     Status: None   Collection Time: 12/17/19  3:30 PM   Specimen: Nasopharyngeal Swab  Result Value Ref Range Status   SARS Coronavirus 2 NEGATIVE NEGATIVE Final    Comment: (NOTE) SARS-CoV-2 target nucleic acids are NOT DETECTED. The SARS-CoV-2 RNA is generally detectable in upper and lower respiratory specimens during the acute phase of infection. The lowest concentration of SARS-CoV-2 viral copies this assay can detect is 250 copies / mL. A negative result does not preclude SARS-CoV-2 infection and should not be used as the sole basis for treatment or other patient management decisions.  A negative result may occur with improper specimen collection / handling, submission of specimen other than nasopharyngeal swab, presence of viral mutation(s) within the areas targeted by this assay, and inadequate number of viral copies (<250 copies / mL). A negative result must be combined with clinical observations, patient history, and epidemiological information. Fact Sheet for Patients:   StrictlyIdeas.no Fact Sheet for Healthcare  Providers: BankingDealers.co.za This test is not yet approved or cleared  by the [place] FDA and has been authorized for detection and/or diagnosis of SARS-CoV-2 by FDA under an Emergency Use Authorization (EUA).  This EUA will remain in effect (meaning this test can be used) for the duration of the COVID-19 declaration under Section 564(b)(1) of the Act, 21 U.S.C. section 360bbb-3(b)(1), unless the authorization is terminated or revoked sooner. Performed at [facility] [address], [place], [place] [address]      Radiology Studies: US RENAL  Result Date: 12/17/2019 CLINICAL DATA:  Acute kidney injury. EXAM: RENAL / URINARY TRACT ULTRASOUND COMPLETE COMPARISON:  CT 09/01/2013. FINDINGS: Right Kidney: Renal measurements: 10.6 x 5.2 x 6.0 cm = volume: 180 mL. Echogenicity within normal limits. No mass or hydronephrosis visualized. Left Kidney: Renal measurements: 10.7 x 5.7 x 6.0 cm = volume: 194 mL. Echogenicity within normal limits. No mass or hydronephrosis visualized. Bladder: Appears normal for degree of bladder distention. No bladder wall thickening. Ureteral jets were not visualized. Other: Incidental hepatic steatosis. IMPRESSION: 1. Unremarkable sonographic  appearance of the kidneys. No obstructive uropathy. 2. Incidental hepatic steatosis. Electronically Signed   By: Keith Rake M.D.   On: 12/17/2019 17:37     Scheduled Meds: . amLODipine  10 mg Oral Daily  . citalopram  40 mg Oral Daily  . divalproex  1,000 mg Oral QHS  . divalproex  500 mg Oral Daily  . ferrous sulfate  324 mg Oral Q breakfast  . furosemide  40 mg Oral Once  . gabapentin  300 mg Oral TID  . insulin aspart  0-15 Units Subcutaneous TID WC  . insulin aspart  0-5 Units Subcutaneous QHS  . insulin aspart  3 Units Subcutaneous TID WC  . insulin glargine  10 Units Subcutaneous Daily  . metoprolol tartrate  25 mg Oral BID  . mometasone-formoterol  2 puff Inhalation BID  .  oxybutynin  5 mg Oral BID  . rivaroxaban  20 mg Oral Q supper  . rosuvastatin  10 mg Oral Daily  . sodium zirconium cyclosilicate  10 g Oral TID   Continuous Infusions: . lactated ringers Stopped (12/18/19 1451)     LOS: 0 days   Time spent: 28 mins    Dayna Alia Wynetta Emery, MD How to contact the [facility] Attending or Consulting provider [place] or covering provider during after hours [place], for this patient?  1. Check the care team in [facility] and look for a) attending/consulting TR[facility] provider listed and b) the [facility] team listed 2. Log into [email or web address] and use [place]'s universal password to access. If you do not have the password, please contact the hospital operator. 3. Locate the [facility] provider you are looking for under [facility] and page to a number that you can be directly reached. 4. If you still have difficulty reaching the provider, please page the [facility] (Director on Call) for the Hospitalists listed on amion for assistance.  12/18/2019, 4:32 PM

## 2019-12-19 ENCOUNTER — Other Ambulatory Visit: Payer: Self-pay | Admitting: *Deleted

## 2019-12-19 DIAGNOSIS — R079 Chest pain, unspecified: Secondary | ICD-10-CM

## 2019-12-19 LAB — BASIC METABOLIC PANEL
Anion gap: 8 (ref 5–15)
BUN: 19 mg/dL (ref 8–23)
CO2: 25 mmol/L (ref 22–32)
Calcium: 9.3 mg/dL (ref 8.9–10.3)
Chloride: 103 mmol/L (ref 98–111)
Creatinine, Ser: 1.17 mg/dL — ABNORMAL HIGH (ref 0.44–1.00)
GFR calc Af Amer: 58 mL/min — ABNORMAL LOW (ref >60)
GFR calc non Af Amer: 50 mL/min — ABNORMAL LOW (ref >60)
Glucose, Bld: 134 mg/dL — ABNORMAL HIGH (ref 70–99)
Potassium: 5 mmol/L (ref 3.5–5.1)
Sodium: 136 mmol/L (ref 135–145)

## 2019-12-19 LAB — MAGNESIUM: Magnesium: 1.6 mg/dL — ABNORMAL LOW (ref 1.7–2.4)

## 2019-12-19 LAB — GLUCOSE, CAPILLARY
Glucose-Capillary: 143 mg/dL — ABNORMAL HIGH (ref 70–99)
Glucose-Capillary: 146 mg/dL — ABNORMAL HIGH (ref 70–99)
Glucose-Capillary: 171 mg/dL — ABNORMAL HIGH (ref 70–99)

## 2019-12-19 MED ORDER — MAGNESIUM SULFATE 2 GM/50ML IV SOLN
2.0000 g | Freq: Once | INTRAVENOUS | Status: AC
  Administered 2019-12-19: 2 g via INTRAVENOUS
  Filled 2019-12-19: qty 50

## 2019-12-19 MED ORDER — GABAPENTIN 300 MG PO CAPS: 300.0000 mg | ORAL_CAPSULE | Freq: Three times a day (TID) | ORAL | 11 refills | Status: DC

## 2019-12-19 MED ORDER — LANTUS SOLOSTAR 100 UNIT/ML ~~LOC~~ SOPN
10.0000 [IU] | PEN_INJECTOR | Freq: Every day | SUBCUTANEOUS | 3 refills | Status: DC
Start: 2019-12-19 — End: 2019-12-30

## 2019-12-19 NOTE — Telephone Encounter (Signed)
PCS referral faxed to [facility]

## 2019-12-19 NOTE — Plan of Care (Signed)
  Problem: Education: Goal: Knowledge of General Education information will improve Description: Including pain rating scale, medication(s)/side effects and non-pharmacologic comfort measures 12/19/2019 1600 by Santa Lighter, RN Outcome: Adequate for Discharge 12/19/2019 1600 by Santa Lighter, RN Outcome: Progressing   Problem: Clinical Measurements: Goal: Ability to maintain clinical measurements within normal limits will improve Outcome: Adequate for Discharge Goal: Will remain free from infection Outcome: Adequate for Discharge Goal: Diagnostic test results will improve Outcome: Adequate for Discharge Goal: Respiratory complications will improve Outcome: Adequate for Discharge Goal: Cardiovascular complication will be avoided Outcome: Adequate for Discharge   Problem: Activity: Goal: Risk for activity intolerance will decrease Outcome: Adequate for Discharge   Problem: Nutrition: Goal: Adequate nutrition will be maintained Outcome: Adequate for Discharge   Problem: Coping: Goal: Level of anxiety will decrease Outcome: Adequate for Discharge   Problem: Elimination: Goal: Will not experience complications related to bowel motility Outcome: Adequate for Discharge Goal: Will not experience complications related to urinary retention Outcome: Adequate for Discharge   Problem: Pain Managment: Goal: General experience of comfort will improve Outcome: Adequate for Discharge   Problem: Safety: Goal: Ability to remain free from injury will improve Outcome: Adequate for Discharge   Problem: Skin Integrity: Goal: Risk for impaired skin integrity will decrease Outcome: Adequate for Discharge

## 2019-12-19 NOTE — Discharge Instructions (Signed)
Acute Kidney Injury, Adult  Acute kidney injury is a sudden worsening of kidney function. The kidneys are organs that have several jobs. They filter the blood to remove waste products and extra fluid. They also maintain a healthy balance of minerals and hormones in the body, which helps control blood pressure and keep bones strong. With this condition, your kidneys do not do their jobs as well as they should. This condition ranges from mild to severe. Over time it may develop into long-lasting (chronic) kidney disease. Early detection and treatment may prevent acute kidney injury from developing into a chronic condition. What are the causes? Common causes of this condition include:  A problem with blood flow to the kidneys. This may be caused by: ? Low blood pressure (hypotension) or shock. ? Blood loss. ? Heart and blood vessel (cardiovascular) disease. ? Severe burns. ? Liver disease.  Direct damage to the kidneys. This may be caused by: ? Certain medicines. ? A kidney infection. ? Poisoning. ? Being around or in contact with toxic substances. ? A surgical wound. ? A hard, direct hit to the kidney area.  A sudden blockage of urine flow. This may be caused by: ? Cancer. ? Kidney stones. ? An enlarged prostate in males. What are the signs or symptoms? Symptoms of this condition may not be obvious until the condition becomes severe. Symptoms of this condition can include:  Tiredness (lethargy), or difficulty staying awake.  Nausea or vomiting.  Swelling (edema) of the face, legs, ankles, or feet.  Problems with urination, such as: ? Abdominal pain, or pain along the side of your stomach (flank). ? Decreased urine production. ? Decrease in the force of urine flow.  Muscle twitches and cramps, especially in the legs.  Confusion or trouble concentrating.  Loss of appetite.  Fever. How is this diagnosed? This condition may be diagnosed with tests, including:  Blood  tests.  Urine tests.  Imaging tests.  A test in which a sample of tissue is removed from the kidneys to be examined under a microscope (kidney biopsy). How is this treated? Treatment for this condition depends on the cause and how severe the condition is. In mild cases, treatment may not be needed. The kidneys may heal on their own. In more severe cases, treatment will involve:  Treating the cause of the kidney injury. This may involve changing any medicines you are taking or adjusting your dosage.  Fluids. You may need specialized IV fluids to balance your body's needs.  Having a catheter placed to drain urine and prevent blockages.  Preventing problems from occurring. This may mean avoiding certain medicines or procedures that can cause further injury to the kidneys. In some cases treatment may also require:  A procedure to remove toxic wastes from the body (dialysis or continuous renal replacement therapy - CRRT).  Surgery. This may be done to repair a torn kidney, or to remove the blockage from the urinary system. Follow these instructions at home: Medicines  Take over-the-counter and prescription medicines only as told by your health care provider.  Do not take any new medicines without your health care provider's approval. Many medicines can worsen your kidney damage.  Do not take any vitamin and mineral supplements without your health care provider's approval. Many nutritional supplements can worsen your kidney damage. Lifestyle  If your health care provider prescribed changes to your diet, follow them. You may need to decrease the amount of protein you eat.  Achieve and maintain a healthy   weight. If you need help with this, ask your health care provider.  Start or continue an exercise plan. Try to exercise at least 30 minutes a day, 5 days a week.  Do not use any tobacco products, such as cigarettes, chewing tobacco, and e-cigarettes. If you need help quitting, ask your  health care provider. General instructions  Keep track of your blood pressure. Report changes in your blood pressure as told by your health care provider.  Stay up to date with immunizations. Ask your health care provider which immunizations you need.  Keep all follow-up visits as told by your health care provider. This is important. Where to find more information  American Association of Kidney Patients: BombTimer.gl  National Kidney Foundation: www.kidney.[place]: https://mathis.com/  Life Options Rehabilitation Program: ? www.lifeoptions.org ? www.kidneyschool.org Contact a health care provider if:  Your symptoms get worse.  You develop new symptoms. Get help right away if:  You develop symptoms of worsening kidney disease, which include: ? Headaches. ? Abnormally dark or light skin. ? Easy bruising. ? Frequent hiccups. ? Chest pain. ? Shortness of breath. ? End of menstruation in women. ? Seizures. ? Confusion or altered mental status. ? Abdominal or back pain. ? Itchiness.  You have a fever.  Your body is producing less urine.  You have pain or bleeding when you urinate. Summary  Acute kidney injury is a sudden worsening of kidney function.  Acute kidney injury can be caused by problems with blood flow to the kidneys, direct damage to the kidneys, and sudden blockage of urine flow.  Symptoms of this condition may not be obvious until it becomes severe. Symptoms may include edema, lethargy, confusion, nausea or vomiting, and problems passing urine.  This condition can usually be diagnosed with blood tests, urine tests, and imaging tests. Sometimes a kidney biopsy is done to diagnose this condition.  Treatment for this condition often involves treating the underlying cause. It is treated with fluids, medicines, dialysis, diet changes, or surgery. This information is not intended to replace advice given to you by your health care provider. Make  sure you discuss any questions you have with your health care provider. Document Revised: 06/09/2017 Document Reviewed: 06/17/2016 Elsevier Patient Education  [place].   Hyperkalemia Hyperkalemia is when you have too much potassium in your blood. Potassium helps your body in many ways, but having too much can cause problems. If there is too much potassium in your blood, it can affect how your heart works. Potassium is normally removed from your body by your kidneys. Many things can cause the amount in your blood to be high. Medicines and other treatments can be used to bring the amount to a normal level. Treatment may need to be done in the hospital. Follow these instructions at home:   Take over-the-counter and prescription medicines only as told by your doctor.  Do not take any of the following unless your doctor says it is okay: ? Supplements. ? Natural products. ? Herbs. ? Vitamins.  Limit how much alcohol you drink as told by your doctor.  Do not use drugs. If you need help quitting, ask your doctor.  If you have kidney disease, you may need to follow a low-potassium diet. A food specialist (dietitian) can help you.  Keep all follow-up visits as told by your doctor. This is important. Contact a doctor if:  Your heartbeat is not regular or is very slow.  You feel dizzy (light-headed).  You feel weak.  You feel sick to your stomach (nauseous).  You have tingling in your hands or feet.  You lose feeling (have numbness) in your hands or feet. Get help right away if:  You are short of breath.  You have chest pain.  You pass out (faint).  You cannot move your muscles. Summary  Hyperkalemia is when you have too much potassium in your blood.  Take over-the-counter and prescription medicines only as told by your doctor.  Limit how much alcohol you drink as told by your doctor.  Contact a doctor if your heartbeat is not regular. This information is not  intended to replace advice given to you by your health care provider. Make sure you discuss any questions you have with your health care provider. Document Revised: 06/12/2017 Document Reviewed: 06/12/2017 Elsevier Patient Education  [place].   IMPORTANT INFORMATION: PAY CLOSE ATTENTION   PHYSICIAN DISCHARGE INSTRUCTIONS  Follow with Primary care provider  [name], NP  and other consultants as instructed by your Hospitalist Physician  [place] IF SYMPTOMS COME BACK, WORSEN OR NEW PROBLEM DEVELOPS   Please note: You were cared for by a hospitalist during your hospital stay. Every effort will be made to forward records to your primary care provider.  You can request that your primary care provider send for your hospital records if they have not received them.  Once you are discharged, your primary care physician will handle any further medical issues. Please note that NO REFILLS for any discharge medications will be authorized once you are discharged, as it is imperative that you return to your primary care physician (or establish a relationship with a primary care physician if you do not have one) for your post hospital discharge needs so that they can reassess your need for medications and monitor your lab values.  Please get a complete blood count and chemistry panel checked by your Primary MD at your next visit, and again as instructed by your Primary MD.  Get Medicines reviewed and adjusted: Please take all your medications with you for your next visit with your Primary MD  Laboratory/radiological data: Please request your Primary MD to go over all hospital tests and procedure/radiological results at the follow up, please ask your primary care provider to get all Hospital records sent to his/her office.  In some cases, they will be blood work, cultures and biopsy results pending at the time of your discharge. Please request that  your primary care provider follow up on these results.  If you are diabetic, please bring your blood sugar readings with you to your follow up appointment with primary care.    Please call and make your follow up appointments as soon as possible.    Also Note the following: If you experience worsening of your admission symptoms, develop shortness of breath, life threatening emergency, suicidal or homicidal thoughts you must seek medical attention immediately by calling 911 or calling your MD immediately  if symptoms less severe.  You must read complete instructions/literature along with all the possible adverse reactions/side effects for all the Medicines you take and that have been prescribed to you. Take any new Medicines after you have completely understood and accpet all the possible adverse reactions/side effects.   Do not drive when taking Pain medications or sleeping medications (Benzodiazepines)  Do not take more than prescribed Pain, Sleep and Anxiety Medications. It is not advisable to combine anxiety,sleep and  pain medications without talking with your primary care practitioner  Special Instructions: If you have smoked or chewed Tobacco  in the last 2 yrs please stop smoking, stop any regular Alcohol  and or any Recreational drug use.  Wear Seat belts while driving.  Do not drive if taking any narcotic, mind altering or controlled substances or recreational drugs or alcohol.

## 2019-12-19 NOTE — Discharge Summary (Addendum)
Physician Discharge Summary  Rhonda Hickman VEL:[id number] DOB: [birth year] DOA: 12/17/2019  PCP: [name], NP  Admit date: 12/17/2019 Discharge date: 12/19/2019  Admitted From:  Home  Disposition:  Home   Recommendations for Outpatient Follow-up:  1. Follow up with PCP in 1 weeks   Discharge Condition: STABLE  CODE STATUS: FULL    Brief Hospitalization Summary: Please see all hospital notes, images, labs for full details of the hospitalization. ADMISSION HPI: Jkayla Hickman is a 64 y.o. female with medical history significant for TIA/CVA, atrial fibrillation on Xarelto, hypertension, type 2 diabetes, dyslipidemia, tobacco abuse, obesity, and likely OSA who presented to the ED after recent new cardiology visit that determined that she had severe hyperkalemia as well as AKI.  She is otherwise asymptomatic.  She states that she had all of her teeth pulled 1 month ago and has had poor oral intake.  She has been trying to drink plenty of water and drinking some protein supplement shakes.  She has been taking her usual home medications to include lisinopril daily and has never had problems with it in the past.   ED Course: Vital signs noted be stable and repeat potassium is confirmed at 6.8 with creatinine 1.78 and usual baseline 0.8-0.9.  EKG with no acute changes with sinus rhythm at 50 bpm.  Glucose is 175.  She has been given Lokelma in the ED.  Brief Admission History:  64 y.o.femalewith medical history significant forTIA/CVA, atrial fibrillation on Xarelto, hypertension, type 2 diabetes, dyslipidemia, tobacco abuse, obesity, and likely OSA who presented to the ED after recent new cardiology visit that determined that she had severe hyperkalemia as well as AKI.  Assessment & Plan:   Principal Problem:   Hyperkalemia Active Problems:   Type 2 diabetes mellitus    Cocaine abuse    Extrinsic asthma   Gait abnormality   Urinary incontinence   Paresthesia   Obesity, Class III,  BMI 40-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)    Gastroesophageal reflux disease   Vertigo   Chronic atrial fibrillation    Diabetic peripheral neuropathy    AKI (acute kidney injury)  1. Hyperkalemia-RESOLVED after aggressive treatments.    2. AKI-IMPROVED.  creatinine slowly improved with IV fluid hydration avoiding NSAIDs and lisinopril. Creatinine down to 1.17.  3. Chronic A. fib on Xarelto for full anticoagulation. 4. History of asthma and COPD-continue home bronchodilators. 5. Hypertension-stable on metoprolol and amlodipine.  Holding lisinopril for AKI.  Did not restart at DC as BPs are soft.  6. Type 2 diabetes mellitus-treated with SSI coverage and CBG testing poorly controlled as evidenced by hemoglobin A1c of 8.9%.  Follow up with PCP.  7. Cerebrovascular disease-continue home statin therapy.  Outpatient neurology follow-up. 8. Bipolar disorder-stable on home meds. 9. OSA  offered inpatient CPAP.   10. Tobacco abuse-patient counseled on cessation.  Patient verbalized understanding. 11. Hypomagnesemia - repleted.   DVT prophylaxis: Xarelto Code Status: Full Family Communication: Patient updated fully at bedside and verbalizes understanding questions answered Disposition: Home  Discharge Diagnoses:  Principal Problem:   Hyperkalemia Active Problems:   Type 2 diabetes mellitus (HCC)   Cocaine abuse ([place])   Extrinsic asthma   Gait abnormality   Urinary incontinence   Paresthesia   Obesity, Class III, BMI 40-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) (HCC)   Gastroesophageal reflux disease   Vertigo   Chronic atrial fibrillation (HCC)   Diabetic peripheral neuropathy (HCC)   AKI (acute kidney injury) ([place])  Discharge Instructions:  Allergies as of 12/19/2019  Reactions   Aspirin Nausea Only, Other (See Comments)   Causes stomach pain      Medication List    STOP taking these medications   diclofenac 50 MG EC tablet Commonly known as: VOLTAREN   lisinopril 20 MG tablet Commonly known  as: ZESTRIL   penicillin v potassium 500 MG tablet Commonly known as: VEETID     TAKE these medications   albuterol 108 (90 Base) MCG/ACT inhaler Commonly known as: VENTOLIN HFA Inhale 2 puffs into the lungs every 6 (six) hours as needed for wheezing or shortness of breath.   amLODipine 10 MG tablet Commonly known as: NORVASC Take 1 tablet (10 mg total) by mouth daily.   budesonide-formoterol 160-4.5 MCG/ACT inhaler Commonly known as: SYMBICORT Inhale 2 puffs into the lungs 2 (two) times daily.   chlorhexidine 0.12 % solution Commonly known as: PERIDEX RINSE MOUTH WITH 1 CAPFULLX30 SECONDS MORNING AND EVENING AFTER BRUSHING. THEN SPIT, NO SWALLOW   citalopram 40 MG tablet Commonly known as: CELEXA Take 40 mg by mouth daily.   Depakote ER 500 MG 24 hr tablet Generic drug: divalproex Take 500-1,000 mg by mouth See admin instructions. 500MG  in the morning and 1000mg  at bedtime   ferrous sulfate 324 MG Tbec Take 1 tablet (324 mg total) by mouth daily with breakfast.   gabapentin 300 MG capsule Commonly known as: NEURONTIN Take 1 capsule (300 mg total) by mouth 3 (three) times daily. What changed: how much to take   Lantus SoloStar 100 UNIT/ML Solostar Pen Generic drug: insulin glargine Inject 10 Units into the skin daily. What changed: how much to take   metFORMIN 1000 MG tablet Commonly known as: GLUCOPHAGE Take 1 tablet (1,000 mg total) by mouth 2 (two) times daily with a meal.   metoprolol tartrate 25 MG tablet Commonly known as: LOPRESSOR Take 1 tablet (25 mg total) by mouth 2 (two) times daily.   oxybutynin 5 MG tablet Commonly known as: DITROPAN Take 5 mg by mouth 2 (two) times daily.   rivaroxaban 20 MG Tabs tablet Commonly known as: Xarelto Take 1 tablet (20 mg total) by mouth daily with supper.   rosuvastatin 10 MG tablet Commonly known as: CRESTOR Take 1 tablet (10 mg total) by mouth daily.   sitaGLIPtin 100 MG tablet Commonly known as:  Januvia Take 1 tablet (100 mg total) by mouth daily.   traMADol 50 MG tablet Commonly known as: ULTRAM Take 1 tablet (50 mg total) by mouth every 6 (six) hours as needed.   Vitamin D (Ergocalciferol) 1.25 MG (50000 UNIT) Caps capsule Commonly known as: DRISDOL Take 1 capsule (50,000 Units total) by mouth every 7 (seven) days.       Follow-up Information    [name], NP. Schedule an appointment as soon as possible for a visit in 1 week(s).   Specialty: Internal Medicine Contact information: [address] [place] [address] [phone]              Allergies  Allergen Reactions  . Aspirin Nausea Only and Other (See Comments)    Causes stomach pain   Allergies as of 12/19/2019      Reactions   Aspirin Nausea Only, Other (See Comments)   Causes stomach pain      Medication List    STOP taking these medications   diclofenac 50 MG EC tablet Commonly known as: VOLTAREN   lisinopril 20 MG tablet Commonly known as: ZESTRIL   penicillin v potassium 500 MG tablet  Commonly known as: VEETID     TAKE these medications   albuterol 108 (90 Base) MCG/ACT inhaler Commonly known as: VENTOLIN HFA Inhale 2 puffs into the lungs every 6 (six) hours as needed for wheezing or shortness of breath.   amLODipine 10 MG tablet Commonly known as: NORVASC Take 1 tablet (10 mg total) by mouth daily.   budesonide-formoterol 160-4.5 MCG/ACT inhaler Commonly known as: SYMBICORT Inhale 2 puffs into the lungs 2 (two) times daily.   chlorhexidine 0.12 % solution Commonly known as: PERIDEX RINSE MOUTH WITH 1 CAPFULLX30 SECONDS MORNING AND EVENING AFTER BRUSHING. THEN SPIT, NO SWALLOW   citalopram 40 MG tablet Commonly known as: CELEXA Take 40 mg by mouth daily.   Depakote ER 500 MG 24 hr tablet Generic drug: divalproex Take 500-1,000 mg by mouth See admin instructions. 500MG  in the morning and 1000mg  at bedtime   ferrous sulfate 324 MG Tbec Take 1 tablet (324  mg total) by mouth daily with breakfast.   gabapentin 300 MG capsule Commonly known as: NEURONTIN Take 1 capsule (300 mg total) by mouth 3 (three) times daily. What changed: how much to take   Lantus SoloStar 100 UNIT/ML Solostar Pen Generic drug: insulin glargine Inject 10 Units into the skin daily. What changed: how much to take   metFORMIN 1000 MG tablet Commonly known as: GLUCOPHAGE Take 1 tablet (1,000 mg total) by mouth 2 (two) times daily with a meal.   metoprolol tartrate 25 MG tablet Commonly known as: LOPRESSOR Take 1 tablet (25 mg total) by mouth 2 (two) times daily.   oxybutynin 5 MG tablet Commonly known as: DITROPAN Take 5 mg by mouth 2 (two) times daily.   rivaroxaban 20 MG Tabs tablet Commonly known as: Xarelto Take 1 tablet (20 mg total) by mouth daily with supper.   rosuvastatin 10 MG tablet Commonly known as: CRESTOR Take 1 tablet (10 mg total) by mouth daily.   sitaGLIPtin 100 MG tablet Commonly known as: Januvia Take 1 tablet (100 mg total) by mouth daily.   traMADol 50 MG tablet Commonly known as: ULTRAM Take 1 tablet (50 mg total) by mouth every 6 (six) hours as needed.   Vitamin D (Ergocalciferol) 1.25 MG (50000 UNIT) Caps capsule Commonly known as: DRISDOL Take 1 capsule (50,000 Units total) by mouth every 7 (seven) days.      Procedures/Studies: US RENAL  Result Date: 12/17/2019 CLINICAL DATA:  Acute kidney injury. EXAM: RENAL / URINARY TRACT ULTRASOUND COMPLETE COMPARISON:  CT 09/01/2013. FINDINGS: Right Kidney: Renal measurements: 10.6 x 5.2 x 6.0 cm = volume: 180 mL. Echogenicity within normal limits. No mass or hydronephrosis visualized. Left Kidney: Renal measurements: 10.7 x 5.7 x 6.0 cm = volume: 194 mL. Echogenicity within normal limits. No mass or hydronephrosis visualized. Bladder: Appears normal for degree of bladder distention. No bladder wall thickening. Ureteral jets were not visualized. Other: Incidental hepatic steatosis.  IMPRESSION: 1. Unremarkable sonographic appearance of the kidneys. No obstructive uropathy. 2. Incidental hepatic steatosis. Electronically Signed   By: Keith Rake M.D.   On: 12/17/2019 17:37      Subjective: Pt says she feels much better, no complaints today.   Discharge Exam: Vitals:   12/18/19 2244 12/19/19 0842  BP:    Pulse: (!) 51   Resp: 18   Temp:    SpO2: 98% 97%   Vitals:   12/18/19 2035 12/18/19 2042 12/18/19 2244 12/19/19 0842  BP:  (!) 106/42    Pulse:  (!) 52 Marland Kitchen)  51   Resp:  20 18   Temp:  98.7 F (37.1 C)    TempSrc:  Oral    SpO2: 99% 99% 98% 97%  Weight:      Height:       General: Pt is alert, awake, not in acute distress Cardiovascular: RRR, S1/S2 +, no rubs, no gallops Respiratory: CTA bilaterally, no wheezing, no rhonchi Abdominal: Soft, NT, ND, bowel sounds + Extremities: no edema, no cyanosis   The results of significant diagnostics from this hospitalization (including imaging, microbiology, ancillary and laboratory) are listed below for reference.     Microbiology: Recent Results (from the past 240 hour(s))  SARS Coronavirus 2 by RT PCR (hospital order, performed in [facility] hospital lab) Nasopharyngeal Nasopharyngeal Swab     Status: None   Collection Time: 12/17/19  3:30 PM   Specimen: Nasopharyngeal Swab  Result Value Ref Range Status   SARS Coronavirus 2 NEGATIVE NEGATIVE Final    Comment: (NOTE) SARS-CoV-2 target nucleic acids are NOT DETECTED. The SARS-CoV-2 RNA is generally detectable in upper and lower respiratory specimens during the acute phase of infection. The lowest concentration of SARS-CoV-2 viral copies this assay can detect is 250 copies / mL. A negative result does not preclude SARS-CoV-2 infection and should not be used as the sole basis for treatment or other patient management decisions.  A negative result may occur with improper specimen collection / handling, submission of specimen other than nasopharyngeal  swab, presence of viral mutation(s) within the areas targeted by this assay, and inadequate number of viral copies (<250 copies / mL). A negative result must be combined with clinical observations, patient history, and epidemiological information. Fact Sheet for Patients:   StrictlyIdeas.no Fact Sheet for Healthcare Providers: BankingDealers.co.za This test is not yet approved or cleared  by the [place] FDA and has been authorized for detection and/or diagnosis of SARS-CoV-2 by FDA under an Emergency Use Authorization (EUA).  This EUA will remain in effect (meaning this test can be used) for the duration of the COVID-19 declaration under Section 564(b)(1) of the Act, 21 U.S.C. section 360bbb-3(b)(1), unless the authorization is terminated or revoked sooner. Performed at [facility] [address], [place], [place] [address]      Labs: BNP (last 3 results) No results for input(s): BNP in the last 8760 hours. Basic Metabolic Panel: Recent Labs  Lab 12/16/19 1450 12/17/19 1103 12/18/19 0553 12/18/19 1355 12/19/19 0602  NA 133* 134* 133* 135 136  K 6.8* 6.8* 6.2* 6.1* 5.0  CL 105 104 105 106 103  CO2 15* 20* 22 24 25   GLUCOSE 84 175* 168* 127* 134*  BUN 17 23 22 22 19   CREATININE 1.75* 1.78* 1.30* 1.40* 1.17*  CALCIUM 9.8 9.5 9.3 9.5 9.3  MG  --  2.0 1.9  --  1.6*   Liver Function Tests: No results for input(s): AST, ALT, ALKPHOS, BILITOT, PROT, ALBUMIN in the last 168 hours. No results for input(s): LIPASE, AMYLASE in the last 168 hours. No results for input(s): AMMONIA in the last 168 hours. CBC: Recent Labs  Lab 12/17/19 1103 12/18/19 0553  WBC 6.6 6.1  NEUTROABS 2.5  --   HGB 12.0 11.1*  HCT 37.6 35.1*  MCV 88.3 88.0  PLT 239 221   Cardiac Enzymes: No results for input(s): CKTOTAL, CKMB, CKMBINDEX, TROPONINI in the last 168 hours. BNP: Invalid input(s): POCBNP CBG: Recent Labs  Lab 12/18/19 1637  12/18/19 2051 12/19/19 0309 12/19/19 0730 12/19/19 1134  GLUCAP 161* 140* 143* 146* 171*   D-Dimer No results for input(s): DDIMER in the last 72 hours. Hgb A1c No results for input(s): HGBA1C in the last 72 hours. Lipid Profile No results for input(s): CHOL, HDL, LDLCALC, TRIG, CHOLHDL, LDLDIRECT in the last 72 hours. Thyroid function studies No results for input(s): TSH, T4TOTAL, T3FREE, THYROIDAB in the last 72 hours.  Invalid input(s): FREET3 Anemia work up No results for input(s): VITAMINB12, FOLATE, FERRITIN, TIBC, IRON, RETICCTPCT in the last 72 hours. Urinalysis    Component Value Date/Time   COLORURINE YELLOW 12/17/2019 1053   APPEARANCEUR CLEAR 12/17/2019 1053   LABSPEC 1.013 12/17/2019 1053   PHURINE 5.0 12/17/2019 1053   GLUCOSEU NEGATIVE 12/17/2019 1053   HGBUR NEGATIVE 12/17/2019 [place] 12/17/2019 1053   KETONESUR NEGATIVE 12/17/2019 1053   PROTEINUR NEGATIVE 12/17/2019 1053   UROBILINOGEN 0.2 09/12/2013 1642   NITRITE NEGATIVE 12/17/2019 1053   LEUKOCYTESUR NEGATIVE 12/17/2019 1053   Sepsis Labs Invalid input(s): PROCALCITONIN,  WBC,  LACTICIDVEN Microbiology Recent Results (from the past 240 hour(s))  SARS Coronavirus 2 by RT PCR (hospital order, performed in [place] hospital lab) Nasopharyngeal Nasopharyngeal Swab     Status: None   Collection Time: 12/17/19  3:30 PM   Specimen: Nasopharyngeal Swab  Result Value Ref Range Status   SARS Coronavirus 2 NEGATIVE NEGATIVE Final    Comment: (NOTE) SARS-CoV-2 target nucleic acids are NOT DETECTED. The SARS-CoV-2 RNA is generally detectable in upper and lower respiratory specimens during the acute phase of infection. The lowest concentration of SARS-CoV-2 viral copies this assay can detect is 250 copies / mL. A negative result does not preclude SARS-CoV-2 infection and should not be used as the sole basis for treatment or other patient management decisions.  A negative result may  occur with improper specimen collection / handling, submission of specimen other than nasopharyngeal swab, presence of viral mutation(s) within the areas targeted by this assay, and inadequate number of viral copies (<250 copies / mL). A negative result must be combined with clinical observations, patient history, and epidemiological information. Fact Sheet for Patients:   StrictlyIdeas.no Fact Sheet for Healthcare Providers: BankingDealers.co.za This test is not yet approved or cleared  by the [place] FDA and has been authorized for detection and/or diagnosis of SARS-CoV-2 by FDA under an Emergency Use Authorization (EUA).  This EUA will remain in effect (meaning this test can be used) for the duration of the COVID-19 declaration under Section 564(b)(1) of the Act, 21 U.S.C. section 360bbb-3(b)(1), unless the authorization is terminated or revoked sooner. Performed at [facility], [address] [place], [place] [address]    Time coordinating discharge: 38 mins  SIGNED:  Irwin Brakeman, MD  [facility] 12/19/2019, 12:53 PM How to contact the [facility] Attending or Consulting provider [place] or covering provider during after hours [place], for this patient?  1. Check the care team in [facility] and look for a) attending/consulting [facility] provider listed and b) the [facility] team listed 2. Log into [email or web address] and use [place]'s universal password to access. If you do not have the password, please contact the hospital operator. 3. Locate the [facility] provider you are looking for under [facility] and page to a number that you can be directly reached. 4. If you still have difficulty reaching the provider, please page the [facility] (Director on Call) for the Hospitalists listed on amion for assistance.

## 2019-12-19 NOTE — Progress Notes (Signed)
Nsg Discharge Note  Admit Date:  12/17/2019 Discharge date: 12/19/2019   Rhonda Hickman to be D/C'd Home per MD order.  AVS completed.  Copy for chart, and copy for patient signed, and dated. Removed IV-clean, dry, intact.  Reviewed d/c paperwork with patient. Answered all questions. Walked stable patient to ED entrance where she drove herself home. Patient/caregiver able to verbalize understanding.  Discharge Medication: Allergies as of 12/19/2019      Reactions   Aspirin Nausea Only, Other (See Comments)   Causes stomach pain      Medication List    STOP taking these medications   diclofenac 50 MG EC tablet Commonly known as: VOLTAREN   lisinopril 20 MG tablet Commonly known as: ZESTRIL   penicillin v potassium 500 MG tablet Commonly known as: VEETID     TAKE these medications   albuterol 108 (90 Base) MCG/ACT inhaler Commonly known as: VENTOLIN HFA Inhale 2 puffs into the lungs every 6 (six) hours as needed for wheezing or shortness of breath.   amLODipine 10 MG tablet Commonly known as: NORVASC Take 1 tablet (10 mg total) by mouth daily.   budesonide-formoterol 160-4.5 MCG/ACT inhaler Commonly known as: SYMBICORT Inhale 2 puffs into the lungs 2 (two) times daily.   chlorhexidine 0.12 % solution Commonly known as: PERIDEX RINSE MOUTH WITH 1 CAPFULLX30 SECONDS MORNING AND EVENING AFTER BRUSHING. THEN SPIT, NO SWALLOW   citalopram 40 MG tablet Commonly known as: CELEXA Take 40 mg by mouth daily.   Depakote ER 500 MG 24 hr tablet Generic drug: divalproex Take 500-1,000 mg by mouth See admin instructions. 500MG  in the morning and 1000mg  at bedtime   ferrous sulfate 324 MG Tbec Take 1 tablet (324 mg total) by mouth daily with breakfast.   gabapentin 300 MG capsule Commonly known as: NEURONTIN Take 1 capsule (300 mg total) by mouth 3 (three) times daily. What changed: how much to take   Lantus SoloStar 100 UNIT/ML Solostar Pen Generic drug: insulin  glargine Inject 10 Units into the skin daily. What changed: how much to take   metFORMIN 1000 MG tablet Commonly known as: GLUCOPHAGE Take 1 tablet (1,000 mg total) by mouth 2 (two) times daily with a meal.   metoprolol tartrate 25 MG tablet Commonly known as: LOPRESSOR Take 1 tablet (25 mg total) by mouth 2 (two) times daily.   oxybutynin 5 MG tablet Commonly known as: DITROPAN Take 5 mg by mouth 2 (two) times daily.   rivaroxaban 20 MG Tabs tablet Commonly known as: Xarelto Take 1 tablet (20 mg total) by mouth daily with supper.   rosuvastatin 10 MG tablet Commonly known as: CRESTOR Take 1 tablet (10 mg total) by mouth daily.   sitaGLIPtin 100 MG tablet Commonly known as: Januvia Take 1 tablet (100 mg total) by mouth daily.   traMADol 50 MG tablet Commonly known as: ULTRAM Take 1 tablet (50 mg total) by mouth every 6 (six) hours as needed.   Vitamin D (Ergocalciferol) 1.25 MG (50000 UNIT) Caps capsule Commonly known as: DRISDOL Take 1 capsule (50,000 Units total) by mouth every 7 (seven) days.       Discharge Assessment: Vitals:   12/18/19 2244 12/19/19 0842  BP:    Pulse: (!) 51   Resp: 18   Temp:    SpO2: 98% 97%   Skin clean, dry and intact without evidence of skin break down, no evidence of skin tears noted. IV catheter discontinued intact. Site without signs and symptoms of complications -  no redness or edema noted at insertion site, patient denies c/o pain - only slight tenderness at site.  Dressing with slight pressure applied.  D/c Instructions-Education: Discharge instructions given to patient/family with verbalized understanding. D/c education completed with patient/family including follow up instructions, medication list, d/c activities limitations if indicated, with other d/c instructions as indicated by MD - patient able to verbalize understanding, all questions fully answered. Patient instructed to return to ED, call 911, or call MD for any changes  in condition.  Patient escorted via [place], and D/C home via private auto.  Santa Lighter, RN 12/19/2019 3:56 PM

## 2019-12-19 NOTE — Plan of Care (Signed)
  Problem: Education: Goal: Knowledge of General Education information will improve Description Including pain rating scale, medication(s)/side effects and non-pharmacologic comfort measures Outcome: Progressing   Problem: Health Behavior/Discharge Planning: Goal: Ability to manage health-related needs will improve Outcome: Progressing   

## 2019-12-20 ENCOUNTER — Telehealth: Payer: Self-pay

## 2019-12-20 ENCOUNTER — Telehealth: Payer: Self-pay | Admitting: *Deleted

## 2019-12-20 ENCOUNTER — Ambulatory Visit (INDEPENDENT_AMBULATORY_CARE_PROVIDER_SITE_OTHER): Payer: Medicaid Other

## 2019-12-20 DIAGNOSIS — I4891 Unspecified atrial fibrillation: Secondary | ICD-10-CM | POA: Diagnosis not present

## 2019-12-20 NOTE — Telephone Encounter (Signed)
-----   Message from Silverio Lay, RN sent at 12/16/2019  3:36 PM EDT ----- Regarding: Split night Sleep study ordered per Dr. Gardiner Rhyme.  Thanks!

## 2019-12-20 NOTE — Telephone Encounter (Signed)
Transition Care Management Follow-up Telephone Call Date of discharge and from where: 12/19/2019 from [place] How have you been since you were released from the hospital? Feeling better  Any questions or concerns? None / Stated she spoke with Markus Daft from [facility] and Holter monitor was sent home for the next 14 days. Verbalized no need for education regarding Holter monitor.  Items Reviewed: Did the pt receive and understand the discharge instructions provided? YES Medications obtained and verified? YES  Any new allergies since your discharge? NONE Dietary orders reviewed? Yes  Do you have support at home? Lives alone   Functional Questionnaire: (I = Independent and D = Dependent) ADLs: I   Follow up appointments reviewed:  PCP Hospital f/u appt confirmed?  Scheduled to see NP [name] 12/23/2019 at 02:30 pm   [place] [facility] f/u appt confirmed? None at this time  Are transportation arrangements needed? NO  If their condition worsens, /is the pt aware to call PCP or go to the Emergency Dept.? YES  Pt is aware if condition is worsening or start experiencing any of diff breathing, SOB, dizziness, slurred speech, chest pain, extreme fatigue,  Persistent nausea and vomiting, bleeding , rapid weight gain, severe uncontrolled pain, or visual disturbances to return to ED  Was the patient provided with contact information for the PCP's office or ED? YES given.  OTHER DME  Stated no Home Care Services were provided as yet.  PT ASKED IF POSSIBLE TO HAVE A NURSE AID FOR FEW HR/DAY/ Please advice and f / u

## 2019-12-23 ENCOUNTER — Encounter: Payer: Self-pay | Admitting: Cardiology

## 2019-12-23 ENCOUNTER — Ambulatory Visit (INDEPENDENT_AMBULATORY_CARE_PROVIDER_SITE_OTHER): Payer: Medicaid Other | Admitting: Primary Care

## 2019-12-25 ENCOUNTER — Other Ambulatory Visit: Payer: Self-pay | Admitting: Orthopedic Surgery

## 2019-12-25 ENCOUNTER — Telehealth: Payer: Self-pay | Admitting: Radiology

## 2019-12-25 ENCOUNTER — Other Ambulatory Visit (HOSPITAL_BASED_OUTPATIENT_CLINIC_OR_DEPARTMENT_OTHER): Payer: Self-pay

## 2019-12-25 MED ORDER — MELOXICAM 7.5 MG PO TABS: 7.5000 mg | ORAL_TABLET | Freq: Every day | ORAL | 5 refills | Status: DC

## 2019-12-25 NOTE — Telephone Encounter (Signed)
Meloxicam sent

## 2019-12-25 NOTE — Telephone Encounter (Signed)
Diclofenac not preferred Drug with Medicaid  Meloxicam Ibuprofen and Naproxen are preferred

## 2019-12-26 ENCOUNTER — Telehealth: Payer: Self-pay | Admitting: Cardiology

## 2019-12-26 DIAGNOSIS — Z7689 Persons encountering health services in other specified circumstances: Secondary | ICD-10-CM | POA: Diagnosis not present

## 2019-12-26 NOTE — Telephone Encounter (Signed)
Patient states she would like to ensure that carotid, scheduled for 12/27/19, will not interfere with zio monitor. Please call.

## 2019-12-26 NOTE — Telephone Encounter (Addendum)
Patient is scheduled for lab study on 01/07/20. pt is scheduled for COVID screening on 01/03/20 3 pm prior to ss..  Patient understands her sleep study will be done at [facility]. Patient understands she will receive a sleep packet in a week or so. Patient understands to call if he does not receive the sleep packet in a timely manner. Patient agrees with treatment and thanked me for call.

## 2019-12-26 NOTE — Telephone Encounter (Signed)
I contacted patient- advised that the Carotid was of the neck, not of the heart, so the monitor should be okay to wear during this.   Patient verbalized understanding.

## 2019-12-27 ENCOUNTER — Ambulatory Visit (HOSPITAL_COMMUNITY): Payer: Medicaid Other

## 2019-12-30 ENCOUNTER — Ambulatory Visit (INDEPENDENT_AMBULATORY_CARE_PROVIDER_SITE_OTHER): Payer: Medicaid Other | Admitting: Primary Care

## 2019-12-30 ENCOUNTER — Other Ambulatory Visit: Payer: Self-pay

## 2019-12-30 ENCOUNTER — Encounter (INDEPENDENT_AMBULATORY_CARE_PROVIDER_SITE_OTHER): Payer: Self-pay | Admitting: Primary Care

## 2019-12-30 VITALS — BP 108/64 | HR 72 | Temp 98.1°F | Ht 72.0 in | Wt 277.0 lb

## 2019-12-30 DIAGNOSIS — Z09 Encounter for follow-up examination after completed treatment for conditions other than malignant neoplasm: Secondary | ICD-10-CM | POA: Diagnosis not present

## 2019-12-30 DIAGNOSIS — R269 Unspecified abnormalities of gait and mobility: Secondary | ICD-10-CM | POA: Diagnosis not present

## 2019-12-30 DIAGNOSIS — Z1211 Encounter for screening for malignant neoplasm of colon: Secondary | ICD-10-CM | POA: Diagnosis not present

## 2019-12-30 DIAGNOSIS — R4781 Slurred speech: Secondary | ICD-10-CM

## 2019-12-30 DIAGNOSIS — Z7689 Persons encountering health services in other specified circumstances: Secondary | ICD-10-CM | POA: Diagnosis not present

## 2019-12-30 MED ORDER — HUMALOG 100 UNIT/ML ~~LOC~~ SOCT: 12.0000 [IU] | Freq: Three times a day (TID) | SUBCUTANEOUS | 3 refills | Status: DC

## 2019-12-30 NOTE — Progress Notes (Signed)
Assessment and Plan: Rhonda Hickman was seen today for hospitalization follow-up.  Diagnoses and all orders for this visit:  Colon cancer screening Normal colon cancer screening.  CDC recommends colorectal screening from ages 50-75.  -     Ambulatory referral to Gastroenterology  Hospital discharge follow-up ED Course: Vital signs noted be stable and repeat potassium is confirmed at 6.8 with creatinine 1.78 and usual baseline 0.8-0.9.  EKG with no acute changes with sinus rhythm at 50 bpm.  Glucose is 175.  She has been given Lokelma in the ED. Cardiology reviewed labs contacted and follow up.  Slurred speech Followed by neurology s/p CVA   Gait abnormality Status post CVA uses rolling walker for stability   Other orders -     insulin lispro (HUMALOG) 100 UNIT/ML cartridge; Inject 0.12 mLs (12 Units total) into the skin 3 (three) times daily with meals. Patient has pens at home she will use them first than call for refills  HPI 64 y.o.female presents for follow up from the hospital. Admit date to the hospital was 12/17/19, patient was discharged from the hospital on 12/19/19, patient was admitted for: Hyperkalemia/abnormal labs. Patient was previously followed by the health department and has Humalog pens at home and would like to use them instead of switching to lantus. Calculated requirement 50 units a day prescribed 12 units 3 times a day   Past Medical History:  Diagnosis Date  . Anemia   . Anxiety   . Asthma   . Atrial fibrillation ([place])   . Bipolar 1 disorder ([place])   . Bulging lumbar disc   . Chronic pain of left knee   . COPD (chronic obstructive pulmonary disease) ([place])   . CVA (cerebral vascular accident) ([place])   . Diabetes mellitus   . Neuropathy   . TIA (transient ischemic attack)      Allergies  Allergen Reactions  . Aspirin Nausea Only and Other (See Comments)    Causes stomach pain      Current Outpatient Medications on File Prior to Visit  Medication Sig Dispense  Refill  . albuterol (VENTOLIN HFA) 108 (90 Base) MCG/ACT inhaler Inhale 2 puffs into the lungs every 6 (six) hours as needed for wheezing or shortness of breath. 8 g 1  . amLODipine (NORVASC) 10 MG tablet Take 1 tablet (10 mg total) by mouth daily. 90 tablet 3  . budesonide-formoterol (SYMBICORT) 160-4.5 MCG/ACT inhaler Inhale 2 puffs into the lungs 2 (two) times daily. 1 Inhaler 3  . chlorhexidine (PERIDEX) 0.12 % solution RINSE MOUTH WITH 1 CAPFULLX30 SECONDS MORNING AND EVENING AFTER BRUSHING. THEN SPIT, NO SWALLOW    . citalopram (CELEXA) 40 MG tablet Take 40 mg by mouth daily.    . divalproex (DEPAKOTE ER) 500 MG 24 hr tablet Take 500-1,000 mg by mouth See admin instructions. 500MG  in the morning and 1000mg  at bedtime    . ferrous sulfate 324 MG TBEC Take 1 tablet (324 mg total) by mouth daily with breakfast. 90 tablet 1  . gabapentin (NEURONTIN) 300 MG capsule Take 1 capsule (300 mg total) by mouth 3 (three) times daily. 270 capsule 11  . insulin glargine (LANTUS SOLOSTAR) 100 UNIT/ML Solostar Pen Inject 10 Units into the skin daily. 5 pen 3  . meloxicam (MOBIC) 7.5 MG tablet Take 1 tablet (7.5 mg total) by mouth daily. 30 tablet 5  . metFORMIN (GLUCOPHAGE) 1000 MG tablet Take 1 tablet (1,000 mg total) by mouth 2 (two) times daily with a meal. 180 tablet  3  . metoprolol tartrate (LOPRESSOR) 25 MG tablet Take 1 tablet (25 mg total) by mouth 2 (two) times daily. 180 tablet 3  . oxybutynin (DITROPAN) 5 MG tablet Take 5 mg by mouth 2 (two) times daily.    . rivaroxaban (XARELTO) 20 MG TABS tablet Take 1 tablet (20 mg total) by mouth daily with supper. 30 tablet 1  . rosuvastatin (CRESTOR) 10 MG tablet Take 1 tablet (10 mg total) by mouth daily. 90 tablet 3  . sitaGLIPtin (JANUVIA) 100 MG tablet Take 1 tablet (100 mg total) by mouth daily. 90 tablet 1  . traMADol (ULTRAM) 50 MG tablet Take 1 tablet (50 mg total) by mouth every 6 (six) hours as needed. 30 tablet 0  . Vitamin D, Ergocalciferol,  (DRISDOL) 1.25 MG (50000 UNIT) CAPS capsule Take 1 capsule (50,000 Units total) by mouth every 7 (seven) days. 32 capsule 0   No current facility-administered medications on file prior to visit.    ROS: all negative except above.   Physical Exam: BP 108/64 (BP Location: Right Arm, Patient Position: Sitting, Cuff Size: Large)   Pulse 72   Temp 98.1 F (36.7 C) (Oral)   Ht 6' (1.829 m)   Wt 277 lb (125.6 kg)   SpO2 97%   BMI 37.57 kg/m  General Appearance: Well nourished, in no apparent distress. Eyes: PERRLA, EOMs, conjunctiva no swelling or erythema  Hearing normal.  Neck: Supple, thyroid normal.  Respiratory: Respiratory effort normal, BS equal bilaterally without rales, rhonchi, wheezing or stridor.  Cardio: RRR with no MRGs. Brisk peripheral pulses without edema.  Abdomen: Soft, + BS.  Non tender, no guarding, rebound, hernias, masses. Lymphatics: Non tender without lymphadenopathy.  Musculoskeletal: Full ROM, unstable gait.  Skin: Warm, dry without rashes, lesions, ecchymosis.  Neuro: Cranial nerves intact. Normal muscle tone, no cerebellar symptoms. Sensation intact.  Psych: Awake and oriented X 3, normal affect, Insight and Judgment appropriate.   [name], NP 3:36 PM [facility]

## 2019-12-30 NOTE — Patient Instructions (Signed)

## 2019-12-31 ENCOUNTER — Telehealth (HOSPITAL_COMMUNITY): Payer: Self-pay

## 2019-12-31 NOTE — Telephone Encounter (Signed)
Encounter complete. 

## 2020-01-01 ENCOUNTER — Telehealth: Payer: Self-pay

## 2020-01-01 NOTE — Telephone Encounter (Signed)
[place] FOR JANUVIA THRU 12/31/20

## 2020-01-01 NOTE — Telephone Encounter (Signed)
Call placed to [facility], spoke to [place] who confirmed that the referral has been processed and the patient has been assessed for services

## 2020-01-01 NOTE — Telephone Encounter (Signed)
Call placed to patient regarding PCS.  She said that she was evaluated for services and someone is coming out to see her to sign paperwork.  She is in agreement to home health services now that she has been discharged from the hospital.  No agency preference.  Informed her that this CM will attempt to find an agency in her area that is accepting referrals.   Referral faxed to [facility] [place] for services in [place].

## 2020-01-02 ENCOUNTER — Ambulatory Visit (HOSPITAL_COMMUNITY)
Admission: RE | Admit: 2020-01-02 | Discharge: 2020-01-02 | Disposition: A | Discharge status: Home / Self Care | Payer: Medicaid Other | Source: Ambulatory Visit | Attending: Internal Medicine | Admitting: Internal Medicine

## 2020-01-02 ENCOUNTER — Other Ambulatory Visit: Payer: Self-pay

## 2020-01-02 DIAGNOSIS — R079 Chest pain, unspecified: Secondary | ICD-10-CM | POA: Diagnosis not present

## 2020-01-02 DIAGNOSIS — Z7689 Persons encountering health services in other specified circumstances: Secondary | ICD-10-CM | POA: Diagnosis not present

## 2020-01-02 MED ORDER — REGADENOSON 0.4 MG/5ML IV SOLN
0.4000 mg | Freq: Once | INTRAVENOUS | Status: AC
  Administered 2020-01-02: 0.4 mg via INTRAVENOUS

## 2020-01-02 MED ORDER — AMINOPHYLLINE 25 MG/ML IV SOLN
75.0000 mg | Freq: Once | INTRAVENOUS | Status: AC
  Administered 2020-01-02: 75 mg via INTRAVENOUS

## 2020-01-02 MED ORDER — TECHNETIUM TC 99M TETROFOSMIN IV KIT
31.0000 | PACK | Freq: Once | INTRAVENOUS | Status: AC | PRN
  Administered 2020-01-02: 31 via INTRAVENOUS
  Filled 2020-01-02: qty 31

## 2020-01-02 NOTE — Telephone Encounter (Signed)
Message received from [facility] stating that they are not able to accept the referral due to staffing.   Call placed to [facility], spoke to Judson Roch who confirmed that they service [facility].   Referral then faxed to [facility] for review.

## 2020-01-03 ENCOUNTER — Ambulatory Visit (HOSPITAL_COMMUNITY)
Admission: RE | Admit: 2020-01-03 | Discharge: 2020-01-03 | Disposition: A | Discharge status: Home / Self Care | Payer: Medicaid Other | Source: Ambulatory Visit | Attending: Cardiology | Admitting: Cardiology

## 2020-01-03 ENCOUNTER — Other Ambulatory Visit (HOSPITAL_COMMUNITY): Payer: Medicaid Other

## 2020-01-03 ENCOUNTER — Ambulatory Visit (INDEPENDENT_AMBULATORY_CARE_PROVIDER_SITE_OTHER): Payer: 59 | Admitting: Primary Care

## 2020-01-03 DIAGNOSIS — Z7689 Persons encountering health services in other specified circumstances: Secondary | ICD-10-CM | POA: Diagnosis not present

## 2020-01-03 LAB — MYOCARDIAL PERFUSION IMAGING
LV dias vol: 124 mL (ref 46–106)
LV sys vol: 65 mL
Peak HR: 68 {beats}/min
Rest HR: 53 {beats}/min
SDS: 6
SRS: 1
SSS: 7
TID: 0.85

## 2020-01-03 MED ORDER — TECHNETIUM TC 99M TETROFOSMIN IV KIT
32.2000 | PACK | Freq: Once | INTRAVENOUS | Status: AC | PRN
  Administered 2020-01-03: 32.2 via INTRAVENOUS

## 2020-01-06 ENCOUNTER — Telehealth: Payer: Self-pay

## 2020-01-06 ENCOUNTER — Encounter: Payer: Self-pay | Admitting: Gastroenterology

## 2020-01-06 ENCOUNTER — Other Ambulatory Visit (INDEPENDENT_AMBULATORY_CARE_PROVIDER_SITE_OTHER): Payer: Self-pay | Admitting: *Deleted

## 2020-01-06 ENCOUNTER — Telehealth (INDEPENDENT_AMBULATORY_CARE_PROVIDER_SITE_OTHER): Payer: Self-pay

## 2020-01-06 MED ORDER — OXYBUTYNIN CHLORIDE 5 MG PO TABS: 5.0000 mg | ORAL_TABLET | Freq: Two times a day (BID) | ORAL | 0 refills | Status: DC

## 2020-01-06 NOTE — Telephone Encounter (Signed)
Fax received from [facility] noting that they are not able to accept the home health referral

## 2020-01-06 NOTE — Telephone Encounter (Signed)
Patient called to request a referral on oxybutynin (DITROPAN) 5 MG tablet    Patient uses [place], [address], [place] [address]   Please advice [phone]

## 2020-01-08 ENCOUNTER — Telehealth: Payer: Self-pay

## 2020-01-08 NOTE — Telephone Encounter (Signed)
Call placed to [facility] # [phone] to inquire if they are able to accept a home health referral in [place].  Spoke to [place] who requested that the referral be faxed to them for review. Referral faxed as requested - fax # [phone].

## 2020-01-09 DIAGNOSIS — E1142 Type 2 diabetes mellitus with diabetic polyneuropathy: Secondary | ICD-10-CM | POA: Diagnosis not present

## 2020-01-10 ENCOUNTER — Telehealth (INDEPENDENT_AMBULATORY_CARE_PROVIDER_SITE_OTHER): Payer: Self-pay | Admitting: Primary Care

## 2020-01-10 DIAGNOSIS — E1142 Type 2 diabetes mellitus with diabetic polyneuropathy: Secondary | ICD-10-CM | POA: Diagnosis not present

## 2020-01-10 NOTE — Telephone Encounter (Signed)
Pt needs refill for insulin pen needles 39mm x 32G  Send to [place], [place] Phone:  [phone]  Fax:  [phone]

## 2020-01-10 NOTE — Telephone Encounter (Signed)
This pt is requesting a refill on her pen needles 12mm x 32G.   however I do not see these on her medication list.  She's requesting they be sent to [facility]. Thanks.

## 2020-01-11 DIAGNOSIS — E1142 Type 2 diabetes mellitus with diabetic polyneuropathy: Secondary | ICD-10-CM | POA: Diagnosis not present

## 2020-01-12 DIAGNOSIS — E1142 Type 2 diabetes mellitus with diabetic polyneuropathy: Secondary | ICD-10-CM | POA: Diagnosis not present

## 2020-01-14 ENCOUNTER — Other Ambulatory Visit: Payer: Self-pay

## 2020-01-14 ENCOUNTER — Telehealth: Payer: Self-pay | Admitting: Neurology

## 2020-01-14 ENCOUNTER — Ambulatory Visit (HOSPITAL_COMMUNITY)
Admission: RE | Admit: 2020-01-14 | Discharge: 2020-01-14 | Disposition: A | Discharge status: Home / Self Care | Payer: Medicaid Other | Source: Ambulatory Visit | Attending: Neurology | Admitting: Neurology

## 2020-01-14 ENCOUNTER — Other Ambulatory Visit (INDEPENDENT_AMBULATORY_CARE_PROVIDER_SITE_OTHER): Payer: Self-pay | Admitting: Primary Care

## 2020-01-14 DIAGNOSIS — G459 Transient cerebral ischemic attack, unspecified: Secondary | ICD-10-CM | POA: Diagnosis not present

## 2020-01-14 DIAGNOSIS — IMO0002 Reserved for concepts with insufficient information to code with codable children: Secondary | ICD-10-CM

## 2020-01-14 DIAGNOSIS — E1142 Type 2 diabetes mellitus with diabetic polyneuropathy: Secondary | ICD-10-CM | POA: Diagnosis not present

## 2020-01-14 DIAGNOSIS — G43709 Chronic migraine without aura, not intractable, without status migrainosus: Secondary | ICD-10-CM

## 2020-01-14 DIAGNOSIS — I482 Chronic atrial fibrillation, unspecified: Secondary | ICD-10-CM | POA: Diagnosis not present

## 2020-01-14 DIAGNOSIS — Z7689 Persons encountering health services in other specified circumstances: Secondary | ICD-10-CM | POA: Diagnosis not present

## 2020-01-14 MED ORDER — "ADVOCATE INSULIN SYRINGE 29G X 1/2"" 0.3 ML MISC": 100.0000 | Freq: Two times a day (BID) | 6 refills | Status: DC

## 2020-01-14 NOTE — Telephone Encounter (Signed)
Patient calling back about this request. Patient requesting a call back.

## 2020-01-14 NOTE — Telephone Encounter (Signed)
I spoke to the patient and she verbalized understanding of the information below.

## 2020-01-14 NOTE — Telephone Encounter (Signed)
Summary: Right Carotid: Velocities in the right ICA are consistent with a 1-39% stenosis.  Left Carotid: Velocities in the left ICA are consistent with a 1-39% stenosis.  Vertebrals:  Bilateral vertebral arteries demonstrate antegrade flow. Subclavians: Normal flow hemodynamics were seen in bilateral subclavian              arteries.  Less than 39% stenosis of bilateral internal carotid artery, no change in treatment plan

## 2020-01-15 ENCOUNTER — Telehealth: Payer: Self-pay

## 2020-01-15 ENCOUNTER — Encounter: Payer: Self-pay | Admitting: Neurology

## 2020-01-15 ENCOUNTER — Telehealth: Payer: Self-pay | Admitting: Neurology

## 2020-01-15 DIAGNOSIS — E1142 Type 2 diabetes mellitus with diabetic polyneuropathy: Secondary | ICD-10-CM | POA: Diagnosis not present

## 2020-01-15 NOTE — Telephone Encounter (Signed)
Call placed to [facility] to check on status of referral, spoke to [place]. She said that she will need to check with her manager on the status of the referral.

## 2020-01-15 NOTE — Telephone Encounter (Signed)
Open in error

## 2020-01-16 DIAGNOSIS — E1142 Type 2 diabetes mellitus with diabetic polyneuropathy: Secondary | ICD-10-CM | POA: Diagnosis not present

## 2020-01-16 NOTE — Telephone Encounter (Signed)
Refills sent on 01/14/20 and patient is aware.

## 2020-01-17 DIAGNOSIS — E1142 Type 2 diabetes mellitus with diabetic polyneuropathy: Secondary | ICD-10-CM | POA: Diagnosis not present

## 2020-01-17 NOTE — Telephone Encounter (Signed)
Call placed to [facility] to check on status of referral, spoke to [facility] who stated that they can't accept patients that have medicaid.  Call placed to [facility] # [phone], spoke to [place] who said that they are not servicing [place] at this time.  Call placed to [facility], spoke to Judson Roch who said they are not able to accept the referral in [place].   Call placed to [facility] # [phone], spoke to [place] who requested that the referral be faxed to them for review  # [phone] Referral then faxed as requested

## 2020-01-18 DIAGNOSIS — E1142 Type 2 diabetes mellitus with diabetic polyneuropathy: Secondary | ICD-10-CM | POA: Diagnosis not present

## 2020-01-19 DIAGNOSIS — E1142 Type 2 diabetes mellitus with diabetic polyneuropathy: Secondary | ICD-10-CM | POA: Diagnosis not present

## 2020-01-20 ENCOUNTER — Telehealth: Payer: Self-pay

## 2020-01-20 DIAGNOSIS — E1142 Type 2 diabetes mellitus with diabetic polyneuropathy: Secondary | ICD-10-CM | POA: Diagnosis not present

## 2020-01-20 NOTE — Telephone Encounter (Signed)
Call received from [facility] who stated that they are not able to accept the referral.  Call placed to [facility], spoke to [facility] who requested that the referral be faxed to # [phone] for review.  Referral then faxed as requested.

## 2020-01-21 DIAGNOSIS — E1142 Type 2 diabetes mellitus with diabetic polyneuropathy: Secondary | ICD-10-CM | POA: Diagnosis not present

## 2020-01-22 ENCOUNTER — Telehealth: Payer: Self-pay

## 2020-01-22 DIAGNOSIS — E1142 Type 2 diabetes mellitus with diabetic polyneuropathy: Secondary | ICD-10-CM | POA: Diagnosis not present

## 2020-01-22 NOTE — Telephone Encounter (Signed)
Call received from [facility] who said that they are not able to accept the home health referral

## 2020-01-22 NOTE — Telephone Encounter (Signed)
Call placed to [facility], spoke to Santiago Glad who stated that they are not able to accept the referral.

## 2020-01-23 ENCOUNTER — Telehealth: Payer: Self-pay

## 2020-01-23 DIAGNOSIS — E1142 Type 2 diabetes mellitus with diabetic polyneuropathy: Secondary | ICD-10-CM | POA: Diagnosis not present

## 2020-01-23 NOTE — Telephone Encounter (Signed)
Call placed to [facility], spoke to [place], who requested that the referral be faxed to them for review.  Fax # [phone].  Referral then faxed as requested.

## 2020-01-24 DIAGNOSIS — E1142 Type 2 diabetes mellitus with diabetic polyneuropathy: Secondary | ICD-10-CM | POA: Diagnosis not present

## 2020-01-25 DIAGNOSIS — E1142 Type 2 diabetes mellitus with diabetic polyneuropathy: Secondary | ICD-10-CM | POA: Diagnosis not present

## 2020-01-26 DIAGNOSIS — E1142 Type 2 diabetes mellitus with diabetic polyneuropathy: Secondary | ICD-10-CM | POA: Diagnosis not present

## 2020-01-27 ENCOUNTER — Telehealth: Payer: Self-pay

## 2020-01-27 DIAGNOSIS — E1142 Type 2 diabetes mellitus with diabetic polyneuropathy: Secondary | ICD-10-CM | POA: Diagnosis not present

## 2020-01-27 NOTE — Telephone Encounter (Signed)
Call placed to [facility] to check on status of referral. Spoke to [place] who said they are not able to accept the referral due to staffing.   Attempted to contact the patient to inform her that this CM has contacted 9 home health agencies over the past few weeks about home health services and none of them were able to accept the referral.  The referral for PCS has already been placed and patient was assessed for personal care assistance .

## 2020-01-28 DIAGNOSIS — E1142 Type 2 diabetes mellitus with diabetic polyneuropathy: Secondary | ICD-10-CM | POA: Diagnosis not present

## 2020-01-28 NOTE — Telephone Encounter (Signed)
Call placed to patient and informed her that this CM has not been able to find a home health agency to accept her referral.  Nine agencies were contacted. She said that she understood.   The patient did confirm that she is receiving PCS 2.75 hours /day x 7 days /week.

## 2020-01-29 DIAGNOSIS — E1142 Type 2 diabetes mellitus with diabetic polyneuropathy: Secondary | ICD-10-CM | POA: Diagnosis not present

## 2020-01-30 DIAGNOSIS — E1142 Type 2 diabetes mellitus with diabetic polyneuropathy: Secondary | ICD-10-CM | POA: Diagnosis not present

## 2020-01-31 ENCOUNTER — Other Ambulatory Visit (HOSPITAL_COMMUNITY): Payer: Medicaid Other

## 2020-01-31 DIAGNOSIS — E1142 Type 2 diabetes mellitus with diabetic polyneuropathy: Secondary | ICD-10-CM | POA: Diagnosis not present

## 2020-02-01 DIAGNOSIS — E1142 Type 2 diabetes mellitus with diabetic polyneuropathy: Secondary | ICD-10-CM | POA: Diagnosis not present

## 2020-02-02 ENCOUNTER — Other Ambulatory Visit: Payer: Self-pay

## 2020-02-02 ENCOUNTER — Ambulatory Visit: Payer: Medicaid Other | Attending: Cardiology | Admitting: Cardiovascular Disease

## 2020-02-02 DIAGNOSIS — G478 Other sleep disorders: Secondary | ICD-10-CM | POA: Insufficient documentation

## 2020-02-02 DIAGNOSIS — E1142 Type 2 diabetes mellitus with diabetic polyneuropathy: Secondary | ICD-10-CM | POA: Diagnosis not present

## 2020-02-02 DIAGNOSIS — G4761 Periodic limb movement disorder: Secondary | ICD-10-CM | POA: Insufficient documentation

## 2020-02-02 DIAGNOSIS — Z7901 Long term (current) use of anticoagulants: Secondary | ICD-10-CM | POA: Diagnosis not present

## 2020-02-02 DIAGNOSIS — I493 Ventricular premature depolarization: Secondary | ICD-10-CM | POA: Insufficient documentation

## 2020-02-02 DIAGNOSIS — I4891 Unspecified atrial fibrillation: Secondary | ICD-10-CM | POA: Diagnosis not present

## 2020-02-02 DIAGNOSIS — G4719 Other hypersomnia: Secondary | ICD-10-CM

## 2020-02-02 DIAGNOSIS — R0683 Snoring: Secondary | ICD-10-CM

## 2020-02-02 DIAGNOSIS — Z794 Long term (current) use of insulin: Secondary | ICD-10-CM | POA: Insufficient documentation

## 2020-02-02 DIAGNOSIS — Z79899 Other long term (current) drug therapy: Secondary | ICD-10-CM | POA: Diagnosis not present

## 2020-02-02 DIAGNOSIS — Z791 Long term (current) use of non-steroidal anti-inflammatories (NSAID): Secondary | ICD-10-CM | POA: Insufficient documentation

## 2020-02-03 DIAGNOSIS — E1142 Type 2 diabetes mellitus with diabetic polyneuropathy: Secondary | ICD-10-CM | POA: Diagnosis not present

## 2020-02-04 ENCOUNTER — Other Ambulatory Visit (INDEPENDENT_AMBULATORY_CARE_PROVIDER_SITE_OTHER): Payer: Self-pay | Admitting: Primary Care

## 2020-02-04 DIAGNOSIS — E1142 Type 2 diabetes mellitus with diabetic polyneuropathy: Secondary | ICD-10-CM | POA: Diagnosis not present

## 2020-02-05 DIAGNOSIS — E1142 Type 2 diabetes mellitus with diabetic polyneuropathy: Secondary | ICD-10-CM | POA: Diagnosis not present

## 2020-02-06 DIAGNOSIS — E1142 Type 2 diabetes mellitus with diabetic polyneuropathy: Secondary | ICD-10-CM | POA: Diagnosis not present

## 2020-02-07 DIAGNOSIS — E1142 Type 2 diabetes mellitus with diabetic polyneuropathy: Secondary | ICD-10-CM | POA: Diagnosis not present

## 2020-02-08 DIAGNOSIS — E1142 Type 2 diabetes mellitus with diabetic polyneuropathy: Secondary | ICD-10-CM | POA: Diagnosis not present

## 2020-02-09 DIAGNOSIS — E1142 Type 2 diabetes mellitus with diabetic polyneuropathy: Secondary | ICD-10-CM | POA: Diagnosis not present

## 2020-02-10 DIAGNOSIS — E1142 Type 2 diabetes mellitus with diabetic polyneuropathy: Secondary | ICD-10-CM | POA: Diagnosis not present

## 2020-02-11 DIAGNOSIS — E1142 Type 2 diabetes mellitus with diabetic polyneuropathy: Secondary | ICD-10-CM | POA: Diagnosis not present

## 2020-02-12 ENCOUNTER — Encounter: Payer: Self-pay | Admitting: Orthopedic Surgery

## 2020-02-12 ENCOUNTER — Ambulatory Visit: Payer: Medicaid Other

## 2020-02-12 ENCOUNTER — Other Ambulatory Visit: Payer: Self-pay

## 2020-02-12 ENCOUNTER — Ambulatory Visit (INDEPENDENT_AMBULATORY_CARE_PROVIDER_SITE_OTHER): Payer: Medicaid Other | Admitting: Orthopedic Surgery

## 2020-02-12 VITALS — BP 103/71 | HR 63 | Ht 72.0 in | Wt 279.0 lb

## 2020-02-12 DIAGNOSIS — M25561 Pain in right knee: Secondary | ICD-10-CM

## 2020-02-12 DIAGNOSIS — M25562 Pain in left knee: Secondary | ICD-10-CM | POA: Diagnosis not present

## 2020-02-12 DIAGNOSIS — G8929 Other chronic pain: Secondary | ICD-10-CM

## 2020-02-12 DIAGNOSIS — G894 Chronic pain syndrome: Secondary | ICD-10-CM | POA: Diagnosis not present

## 2020-02-12 DIAGNOSIS — E1142 Type 2 diabetes mellitus with diabetic polyneuropathy: Secondary | ICD-10-CM | POA: Diagnosis not present

## 2020-02-12 NOTE — Progress Notes (Signed)
Progress Note   Patient ID: Rhonda Hickman, female   DOB: 05-10-1956, 64 y.o.   MRN: [id number]  Body mass index is 37.84 kg/m.  Chief Complaint  Patient presents with  . Knee Pain    both knees hurt     Encounter Diagnoses  Name Primary?  . Chronic pain of right knee Yes  . Chronic pain of left knee   . Chronic pain syndrome     64 year old female with atrial fibrillation history chronic pain presents for recheck on bilateral knee pain and she is requesting a referral to chronic pain management  Patient is currently on Xarelto for her atrial fibrillation she takes some Neurontin she is a diabetic hypertension  She is also on Ultram but says is not controlling her pain in both knees   Past Medical History:  Diagnosis Date  . Anemia   . Anxiety   . Asthma   . Atrial fibrillation ([place])   . Bipolar 1 disorder ([place])   . Bulging lumbar disc   . Chronic pain of left knee   . COPD (chronic obstructive pulmonary disease) ([place])   . CVA (cerebral vascular accident) ([place])   . Diabetes mellitus   . Neuropathy   . TIA (transient ischemic attack)     Review of Systems  Respiratory: Negative for shortness of breath.   Cardiovascular: Negative for chest pain.   BP 103/71   Pulse 63   Ht 6' (1.829 m)   Wt 279 lb (126.6 kg)   BMI 37.84 kg/m   Physical Exam Vitals reviewed.  Musculoskeletal:     Comments: Okay right and left knee show bilateral small effusions mild tenderness surrounding the joint knee flexion arc is greater than 90 degrees both knees are stable muscle tone is normal skin is intact  Neurological:     Mental Status: She is alert.  Psychiatric:        Mood and Affect: Mood normal.        Thought Content: Thought content normal.      MEDICAL DECISION MAKING Encounter Diagnoses  Name Primary?  . Chronic pain of right knee Yes  . Chronic pain of left knee   . 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 MANAGEMENT   Procedure note for bilateral knee injections  Procedure note left knee injection verbal consent was obtained to inject left knee joint  Timeout was completed to confirm the site of injection  The medications used were 40 mg of Depo-Medrol and 1% lidocaine 3 cc  Anesthesia was provided by ethyl chloride and the skin was prepped with alcohol.  After cleaning the skin with alcohol a 20-gauge needle was used to inject the left knee joint. There were no complications. A sterile bandage was applied.   Procedure note right knee injection verbal consent was obtained to inject right knee joint  Timeout was completed to confirm the site of injection  The medications used were 40 mg of Depo-Medrol and 1% lidocaine 3 cc  Anesthesia was provided by ethyl chloride and the skin was prepped with alcohol.  After cleaning the skin with alcohol a 20-gauge needle was used to inject the right knee joint. There were no complications. A sterile bandage was applied.  Referral to pain  management   Fu 6 months     No orders of the defined types were placed in this encounter.   Arther Abbott, MD 02/12/2020 9:30 AM

## 2020-02-12 NOTE — Patient Instructions (Addendum)
You have received an injection of steroids into the joint. 15% of patients will have increased pain within the 24 hours postinjection.   This is transient and will go away.   We recommend that you use ice packs on the injection site for 20 minutes every 2 hours and extra strength Tylenol 2 tablets every 8 as needed until the pain resolves.  If you continue to have pain after taking the Tylenol and using the ice please call the office for further instructions.   Steps to Quit Smoking Smoking tobacco is the leading cause of preventable death. It can affect almost every organ in the body. Smoking puts you and people around you at risk for many serious, long-lasting (chronic) diseases. Quitting smoking can be hard, but it is one of the best things that you can do for your health. It is never too late to quit. How do I get ready to quit? When you decide to quit smoking, make a plan to help you succeed. Before you quit:  Pick a date to quit. Set a date within the next 2 weeks to give you time to prepare.  Write down the reasons why you are quitting. Keep this list in places where you will see it often.  Tell your family, friends, and co-workers that you are quitting. Their support is important.  Talk with your doctor about the choices that may help you quit.  Find out if your health insurance will pay for these treatments.  Know the people, places, things, and activities that make you want to smoke (triggers). Avoid them. What first steps can I take to quit smoking?  Throw away all cigarettes at home, at work, and in your car.  Throw away the things that you use when you smoke, such as ashtrays and lighters.  Clean your car. Make sure to empty the ashtray.  Clean your home, including curtains and carpets. What can I do to help me quit smoking? Talk with your doctor about taking medicines and seeing a counselor at the same time. You are more likely to succeed when you do both.  If you  are pregnant or breastfeeding, talk with your doctor about counseling or other ways to quit smoking. Do not take medicine to help you quit smoking unless your doctor tells you to do so. To quit smoking: Quit right away  Quit smoking totally, instead of slowly cutting back on how much you smoke over a period of time.  Go to counseling. You are more likely to quit if you go to counseling sessions regularly. Take medicine You may take medicines to help you quit. Some medicines need a prescription, and some you can buy over-the-counter. Some medicines may contain a drug called nicotine to replace the nicotine in cigarettes. Medicines may:  Help you to stop having the desire to smoke (cravings).  Help to stop the problems that come when you stop smoking (withdrawal symptoms). Your doctor may ask you to use:  Nicotine patches, gum, or lozenges.  Nicotine inhalers or sprays.  Non-nicotine medicine that is taken by mouth. Find resources Find resources and other ways to help you quit smoking and remain smoke-free after you quit. These resources are most helpful when you use them often. They include:  Online chats with a Social worker.  Phone quitlines.  Printed Furniture conservator/restorer.  Support groups or group counseling.  Text messaging programs.  Mobile phone apps. Use apps on your mobile phone or tablet that can help you stick to  your quit plan. There are many free apps for mobile phones and tablets as well as websites. Examples include Quit Guide from the [employer] and smokefree.gov  What things can I do to make it easier to quit?   Talk to your family and friends. Ask them to support and encourage you.  Call a phone quitline (1-800-QUIT-NOW), reach out to support groups, or work with a Social worker.  Ask people who smoke to not smoke around you.  Avoid places that make you want to smoke, such as: ? Bars. ? Parties. ? Smoke-break areas at work.  Spend time with people who do not  smoke.  Lower the stress in your life. Stress can make you want to smoke. Try these things to help your stress: ? Getting regular exercise. ? Doing deep-breathing exercises. ? Doing yoga. ? Meditating. ? Doing a body scan. To do this, close your eyes, focus on one area of your body at a time from head to toe. Notice which parts of your body are tense. Try to relax the muscles in those areas. How will I feel when I quit smoking? Day 1 to 3 weeks Within the first 24 hours, you may start to have some problems that come from quitting tobacco. These problems are very bad 2-3 days after you quit, but they do not often last for more than 2-3 weeks. You may get these symptoms:  Mood swings.  Feeling restless, nervous, angry, or annoyed.  Trouble concentrating.  Dizziness.  Strong desire for high-sugar foods and nicotine.  Weight gain.  Trouble pooping (constipation).  Feeling like you may vomit (nausea).  Coughing or a sore throat.  Changes in how the medicines that you take for other issues work in your body.  Depression.  Trouble sleeping (insomnia). Week 3 and afterward After the first 2-3 weeks of quitting, you may start to notice more positive results, such as:  Better sense of smell and taste.  Less coughing and sore throat.  Slower heart rate.  Lower blood pressure.  Clearer skin.  Better breathing.  Fewer sick days. Quitting smoking can be hard. Do not give up if you fail the first time. Some people need to try a few times before they succeed. Do your best to stick to your quit plan, and talk with your doctor if you have any questions or concerns. Summary  Smoking tobacco is the leading cause of preventable death. Quitting smoking can be hard, but it is one of the best things that you can do for your health.  When you decide to quit smoking, make a plan to help you succeed.  Quit smoking right away, not slowly over a period of time.  When you start  quitting, seek help from your doctor, family, or friends. This information is not intended to replace advice given to you by your health care provider. Make sure you discuss any questions you have with your health care provider. Document Revised: 03/22/2019 Document Reviewed: 09/15/2018 Elsevier Patient Education  [place].

## 2020-02-13 DIAGNOSIS — E1142 Type 2 diabetes mellitus with diabetic polyneuropathy: Secondary | ICD-10-CM | POA: Diagnosis not present

## 2020-02-14 DIAGNOSIS — E1142 Type 2 diabetes mellitus with diabetic polyneuropathy: Secondary | ICD-10-CM | POA: Diagnosis not present

## 2020-02-15 ENCOUNTER — Encounter: Payer: Self-pay | Admitting: Cardiovascular Disease

## 2020-02-15 DIAGNOSIS — E1142 Type 2 diabetes mellitus with diabetic polyneuropathy: Secondary | ICD-10-CM | POA: Diagnosis not present

## 2020-02-15 NOTE — Procedures (Signed)
[facility]       Patient Name: Rhonda Hickman, Rhonda Hickman Date: 02/02/2020 Gender: Female D.O.B: 1956/07/07 Age (years): 64 Referring Provider: Oswaldo Milian Height (inches): 72 Interpreting Physician: Shelva Majestic MD, ABSM Weight (lbs): 277 RPSGT: Rosebud Poles BMI: 38 MRN: [id number] Neck Size: 16.50  CLINICAL INFORMATION Sleep Study Type: NPSG  Indication for sleep study: PAF, snoring, daytime sleepiness  Epworth Sleepiness Score: 19  SLEEP STUDY TECHNIQUE As per the AASM Manual for the Scoring of Sleep and Associated Events v2.3 (April 2016) with a hypopnea requiring 4% desaturations.  The channels recorded and monitored were frontal, central and occipital EEG, electrooculogram (EOG), submentalis EMG (chin), nasal and oral airflow, thoracic and abdominal wall motion, anterior tibialis EMG, snore microphone, electrocardiogram, and pulse oximetry.  MEDICATIONS albuterol (VENTOLIN HFA) 108 (90 Base) MCG/ACT inhaler amLODipine (NORVASC) 10 MG tablet budesonide-formoterol (SYMBICORT) 160-4.5 MCG/ACT inhaler chlorhexidine (PERIDEX) 0.12 % solution citalopram (CELEXA) 40 MG tablet divalproex (DEPAKOTE ER) 500 MG 24 hr tablet ferrous sulfate 324 MG TBEC gabapentin (NEURONTIN) 300 MG capsule insulin lispro (HUMALOG) 100 UNIT/ML cartridge Insulin Syringe-Needle U-100 (ADVOCATE INSULIN SYRINGE) 29G X 1/2" 0.3 ML MISC meloxicam (MOBIC) 7.5 MG tablet metFORMIN (GLUCOPHAGE) 1000 MG tablet metoprolol tartrate (LOPRESSOR) 25 MG tablet oxybutynin (DITROPAN) 5 MG tablet rosuvastatin (CRESTOR) 10 MG tablet sitaGLIPtin (JANUVIA) 100 MG tablet traMADol (ULTRAM) 50 MG tablet Vitamin D, Ergocalciferol, (DRISDOL) 1.25 MG (50000 UNIT) CAPS capsule XARELTO 20 MG TABS tablet  Medications self-administered by patient taken the night of the study : N/A  SLEEP ARCHITECTURE The study was initiated at 10:05:42 PM and ended at 4:41:31 AM.  Sleep onset time was 10.2 minutes and  the sleep efficiency was 76.4%%. The total sleep time was 302.6 minutes.  Stage REM latency was 238.0 minutes.  The patient spent 10.1%% of the night in stage N1 sleep, 53.7%% in stage N2 sleep, 28.1%% in stage N3 and 8.1% in REM.  Alpha intrusion was absent.  Supine sleep was 0.00%.  RESPIRATORY PARAMETERS The overall apnea/hypopnea index (AHI) was 2.0 per hour. The respiratory disturbance index (RDI) was 2.0/h.There were 0 total apneas, including 0 obstructive, 0 central and 0 mixed apneas. There were 10 hypopneas and 0 RERAs.  The AHI during Stage REM sleep was 7.3 per hour.  AHI while supine was N/A per hour.  The mean oxygen saturation was 97.9%. The minimum SpO2 during sleep was 94.0%.  Moderate snoring was noted during this study.  CARDIAC DATA The 2 lead EKG demonstrated sinus rhythm. The mean heart rate was 64.9 beats per minute. Other EKG findings include: PVCs.  LEG MOVEMENT DATA The total PLMS were 22 with a resulting PLMS index of 4.4. Associated arousal with leg movement index was 13.7 .  IMPRESSIONS - Increased upper airway resistance (UARS) without significant obstructive sleep apnea overall (AHI 2.0/h); however, sleep apnea was mild during REM sleep (AHI 7.3/h). - No significant central sleep apnea occurred during this study (CAI = 0.0/h). - The patient had no oxygen desaturation during the study (Min O2 = 94.0%) - The patient snored with moderate snoring volume. - EKG findings include PVCs. - Clinically significant periodic limb movements did not occur during sleep. Associated arousals were significant.  DIAGNOSIS - Periodic Limb Movement During Sleep (G47.61) - Excessive Daytime Sleepiness  RECOMMENDATIONS - At present there is no indication for CPAP therapy. - Effort should be made to optimize nasal and oropharyngeal patency. - Consider alternatives for the treatment of moderate snoring. - If patient is symptomatic  with restless legs consider Mirapex,  Requip, or Sinemet for treatment of Periodic Leg Movements of Sleep. - If patient continues to have significant daytime sleepiness (ESS 19), consider scheduling a Multiple Sleep Latency Test (MSLT) to evaluate for narcolepsy or idiopathic hypersomnia. - Avoid alcohol, sedatives and other CNS depressants that may worsen sleep apnea and disrupt normal sleep architecture. - Sleep hygiene should be reviewed to assess factors that may improve sleep quality. - Weight management (BMI 38) and regular exercise should be initiated or continued if appropriate.  [Electronically signed] 02/15/2020 11:11 AM  Shelva Majestic MD, [facility], ABSM Diplomate, American Board of Sleep Medicine   NPI: [id number] PH: [phone]   FX: [phone] [place]

## 2020-02-16 DIAGNOSIS — E1142 Type 2 diabetes mellitus with diabetic polyneuropathy: Secondary | ICD-10-CM | POA: Diagnosis not present

## 2020-02-26 ENCOUNTER — Other Ambulatory Visit (INDEPENDENT_AMBULATORY_CARE_PROVIDER_SITE_OTHER): Payer: Self-pay | Admitting: Primary Care

## 2020-02-26 DIAGNOSIS — E1100 Type 2 diabetes mellitus with hyperosmolarity without nonketotic hyperglycemic-hyperosmolar coma (NKHHC): Secondary | ICD-10-CM

## 2020-02-26 NOTE — Telephone Encounter (Signed)
Requested medication (s) are due for refill today:yes  Requested medication (s) are on the active medication list: yes  Last refill: 11/28/19  #90  1 refill  Future visit scheduled:yes  Notes to clinic:  Cr was 1.17 on 12/19/19    Requested Prescriptions  Pending Prescriptions Disp Refills   JANUVIA 100 MG tablet [Pharmacy Med Name: JANUVIA 100 MG TABLET] 30 tablet 0    Sig: TAKE 1 TABLET DAILY      Endocrinology:  Diabetes - DPP-4 Inhibitors Failed - 02/26/2020 11:43 AM      Failed - HBA1C is between 0 and 7.9 and within 180 days    Hemoglobin A1C  Date Value Ref Range Status  11/28/2019 8.9 (A) 4.0 - 5.6 % Final   Hgb A1c MFr Bld  Date Value Ref Range Status  08/04/2019 8.7 (H) 4.8 - 5.6 % Final    Comment:    (NOTE) Pre diabetes:          5.7%-6.4% Diabetes:              >6.4% Glycemic control for   <7.0% adults with diabetes           Failed - Cr in normal range and within 360 days    Creatinine, Ser  Date Value Ref Range Status  12/19/2019 1.17 (H) 0.44 - 1.00 mg/dL Final   Creatinine, Urine  Date Value Ref Range Status  12/17/2019 128.15 mg/dL Final    Comment:    Performed at [facility], [address], [place], [place] [address]          Passed - Valid encounter within last 6 months    Recent Outpatient Visits           1 month ago Colon cancer screening   [place], Michelle P, NP   3 months ago Type 2 diabetes mellitus with 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, with long-term current use of insulin ([place])   [place], Michelle P, NP   5 months ago Obesity, Class III, BMI 40-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) ([place])   [place], Rayne, NP       Future Appointments             In 2 days [name], NP [place]   In 1 month [name], MD [facility]

## 2020-02-28 ENCOUNTER — Ambulatory Visit (INDEPENDENT_AMBULATORY_CARE_PROVIDER_SITE_OTHER): Payer: 59 | Admitting: Primary Care

## 2020-03-03 ENCOUNTER — Ambulatory Visit: Payer: Medicaid Other | Admitting: Gastroenterology

## 2020-03-03 ENCOUNTER — Telehealth: Payer: Self-pay | Admitting: Orthopedic Surgery

## 2020-03-03 ENCOUNTER — Encounter: Payer: Self-pay | Admitting: Gastroenterology

## 2020-03-03 ENCOUNTER — Telehealth: Payer: Self-pay

## 2020-03-03 VITALS — BP 124/70 | HR 61 | Ht 72.0 in | Wt 273.0 lb

## 2020-03-03 DIAGNOSIS — M25561 Pain in right knee: Secondary | ICD-10-CM

## 2020-03-03 DIAGNOSIS — Z1211 Encounter for screening for malignant neoplasm of colon: Secondary | ICD-10-CM

## 2020-03-03 DIAGNOSIS — Z7689 Persons encountering health services in other specified circumstances: Secondary | ICD-10-CM | POA: Diagnosis not present

## 2020-03-03 DIAGNOSIS — I4891 Unspecified atrial fibrillation: Secondary | ICD-10-CM

## 2020-03-03 DIAGNOSIS — Z7901 Long term (current) use of anticoagulants: Secondary | ICD-10-CM | POA: Diagnosis not present

## 2020-03-03 DIAGNOSIS — R12 Heartburn: Secondary | ICD-10-CM | POA: Diagnosis not present

## 2020-03-03 MED ORDER — PLENVU 140 G PO SOLR
140.0000 g | ORAL | 0 refills | Status: DC
Start: 2020-03-03 — End: 2023-06-13

## 2020-03-03 NOTE — Telephone Encounter (Signed)
Must have missed it, it is in Dr Althia Forts note Have put in order Faxed to [facility] They will call her with appointment

## 2020-03-03 NOTE — Telephone Encounter (Signed)
[facility] Pre-operative Risk Assessment     Request for surgical clearance:     Endoscopy Procedure  What type of surgery is being performed?     Colonoscopy   When is this surgery scheduled?     04-30-2020  What type of clearance is required ?   Pharmacy  Are there any medications that need to be held prior to surgery and how long? Xarelto, 2 days   Practice name and name of physician performing surgery?      [facility]  What is your office phone and fax number?      Phone- [phone]  [phone]  Anesthesia type (None, local, MAC, general) ?       MAC

## 2020-03-03 NOTE — Telephone Encounter (Signed)
Ok Rhonda Hickman send to pain mngment

## 2020-03-03 NOTE — Progress Notes (Signed)
Referring Provider: [name], NP Primary Care Physician:  [name], NP  Reason for Consultation:  Screening colonoscopy   IMPRESSION:  Need for colon cancer screening Atrial fibrillation on long-term Xarelto Intermittent heartburn  Colonoscopy recommended for colon cancer screening. I have recommended holding Xarelto  for 2 days before endoscopy.  I discussed with the patient that there is a low, but real, risk of a cardiovascular event such as heart attack, stroke, or embolism/thrombosis while off Xarelto. Will communicate by phone or EMR with patient's prescribing provider to confirm that holding the Xarelto is appropriate at this time.   Discussed management strategies for intermittent heartburn. She prefers avoiding offending foods.    PLAN: Reviewed GERD lifestyle modifications Trial of famotidine 20 mg BID PRN Colonoscopy  Please see the "Patient Instructions" section for addition details about the plan.  HPI: Rhonda Hickman is a 64 y.o. female  She has a history of TIA 07/2019, diabetes, BPD, COPD and atrial fibrillation on Xarelto.  TTE on 08/03/2019 showed LVEF 60-65%, normal RV function, normal biatrial size, no significant valvular disease. She has had a cholecystectomy. The history is obtained through the patient and review of her electronic health record.    Referred by NP Edwards for screening colonoscopy. No prior colonoscopy or colon cancer screening.   Notes some epigastric discomfort with greasy foods. Occurs only rarely and does not happen frequently enough to necessitate daily therapy.  No evidence for GI bleeding, anorexia, unexplained weight loss, dysphagia, odynophagia, dysphonia, vomiting.  12/18/19: hemoglobin 11.1, MCV 88, RDW 15.8, platelets 221  No known family history of colon cancer or polyps. No family history of uterine/endometrial cancer, pancreatic cancer or gastric/stomach cancer.   Past Medical History:  Diagnosis Date  .  Anemia   . Anxiety   . Asthma   . Atrial fibrillation ([place])   . Bipolar 1 disorder ([place])   . Bulging lumbar disc   . Chronic pain of left knee   . COPD (chronic obstructive pulmonary disease) ([place])   . CVA (cerebral vascular accident) ([place])   . Diabetes mellitus   . Neuropathy   . TIA (transient ischemic attack)     Past Surgical History:  Procedure Laterality Date  . ABDOMINAL HYSTERECTOMY    . CARPAL TUNNEL RELEASE Right   . CHOLECYSTECTOMY    . KNEE ARTHROSCOPY WITH MEDIAL MENISECTOMY Right 04/14/2016   Procedure: KNEE ARTHROSCOPY WITH MEDIAL AND LATERAL MENISECTOMY, MICROFRACTURE REPAIR;  Surgeon: Carole Civil, MD;  Location: [facility];  Service: Orthopedics;  Laterality: Right;  lateral menisectomy - needs crutch training    Current Outpatient Medications  Medication Sig Dispense Refill  . albuterol (VENTOLIN HFA) 108 (90 Base) MCG/ACT inhaler Inhale 2 puffs into the lungs every 6 (six) hours as needed for wheezing or shortness of breath. 8 g 1  . amLODipine (NORVASC) 10 MG tablet Take 1 tablet (10 mg total) by mouth daily. 90 tablet 3  . budesonide-formoterol (SYMBICORT) 160-4.5 MCG/ACT inhaler Inhale 2 puffs into the lungs 2 (two) times daily. 1 Inhaler 3  . chlorhexidine (PERIDEX) 0.12 % solution RINSE MOUTH WITH 1 CAPFULLX30 SECONDS MORNING AND EVENING AFTER BRUSHING. THEN SPIT, NO SWALLOW    . citalopram (CELEXA) 40 MG tablet Take 40 mg by mouth daily.    . divalproex (DEPAKOTE ER) 500 MG 24 hr tablet Take 500-1,000 mg by mouth See admin instructions. 500MG  in the morning and 1000mg  at bedtime    . ferrous sulfate 324 MG TBEC  Take 1 tablet (324 mg total) by mouth daily with breakfast. 90 tablet 1  . gabapentin (NEURONTIN) 300 MG capsule Take 1 capsule (300 mg total) by mouth 3 (three) times daily. 270 capsule 11  . insulin lispro (HUMALOG) 100 UNIT/ML cartridge Inject 0.12 mLs (12 Units total) into the skin 3 (three) times daily with meals. Patient has pens at home she  will use them first than call for refills 15 mL 3  . Insulin Syringe-Needle U-100 (ADVOCATE INSULIN SYRINGE) 29G X 1/2" 0.3 ML MISC 856 applicators by Does not apply route 2 (two) times daily after a meal. 200 each 6  . JANUVIA 100 MG tablet TAKE 1 TABLET DAILY 30 tablet 2  . meloxicam (MOBIC) 7.5 MG tablet Take 1 tablet (7.5 mg total) by mouth daily. 30 tablet 5  . metFORMIN (GLUCOPHAGE) 1000 MG tablet Take 1 tablet (1,000 mg total) by mouth 2 (two) times daily with a meal. 180 tablet 3  . metoprolol tartrate (LOPRESSOR) 25 MG tablet Take 1 tablet (25 mg total) by mouth 2 (two) times daily. 180 tablet 3  . oxybutynin (DITROPAN) 5 MG tablet Take 1 tablet 2 (two) times daily. 60 tablet 0  . rosuvastatin (CRESTOR) 10 MG tablet Take 1 tablet (10 mg total) by mouth daily. 90 tablet 3  . traMADol (ULTRAM) 50 MG tablet Take 1 tablet (50 mg total) by mouth every 6 (six) hours as needed. 30 tablet 0  . Vitamin D, Ergocalciferol, (DRISDOL) 1.25 MG (50000 UNIT) CAPS capsule Take 1 capsule (50,000 Units total) by mouth every 7 (seven) days. 32 capsule 0  . XARELTO 20 MG TABS tablet TAKE 1 TABLET WITH SUPPER 30 tablet 0   No current facility-administered medications for this visit.    Allergies as of 03/03/2020 - Review Complete 03/03/2020  Allergen Reaction Noted  . Aspirin Nausea Only and Other (See Comments) 11/30/2011    Family History  Problem Relation Age of Onset  . Diabetes Mother   . Heart attack Father     Social History   Socioeconomic History  . Marital status: Single    Spouse name: Not on file  . Number of children: 0  . Years of education: 20  . Highest education level: High school graduate  Occupational History  . Occupation: Unemployed  Tobacco Use  . Smoking status: Current Some Day Smoker    Packs/day: 0.50    Types: Cigarettes  . Smokeless tobacco: Never Used  . Tobacco comment: tobacco info given  Vaping Use  . Vaping Use: Some days  Substance and Sexual Activity   . Alcohol use: No  . Drug use: No  . Sexual activity: Never  Other Topics Concern  . Not on file  Social History Narrative   Lives at home alone.   Right-handed.   No caffeine use.   Social Determinants of Health   Financial Resource Strain:   . Difficulty of Paying Living Expenses: Not on file  Food Insecurity:   . Worried About Charity fundraiser in the Last Year: Not on file  . Ran Out of Food in the Last Year: Not on file  Transportation Needs:   . Lack of Transportation (Medical): Not on file  . Lack of Transportation (Non-Medical): Not on file  Physical Activity:   . Days of Exercise per Week: Not on file  . Minutes of Exercise per Session: Not on file  Stress:   . Feeling of Stress : Not on file  Social  Connections:   . Frequency of Communication with Friends and Family: Not on file  . Frequency of Social Gatherings with Friends and Family: Not on file  . Attends Religious Services: Not on file  . Active Member of Clubs or Organizations: Not on file  . Attends Archivist Meetings: Not on file  . Marital Status: Not on file  Intimate Partner Violence:   . Fear of Current or Ex-Partner: Not on file  . Emotionally Abused: Not on file  . Physically Abused: Not on file  . Sexually Abused: Not on file    Review of Systems: 12 system ROS is negative except as noted above with the addition of arthritis, back pain, depression, headaches, hearing problems, insomnia, excessive urination, urine leakage.   Physical Exam: General:   Alert,  well-nourished, pleasant and cooperative in NAD Head:  Normocephalic and atraumatic. Eyes:  Sclera clear, no icterus.   Conjunctiva pink. Ears:  Normal auditory acuity. Nose:  No deformity, discharge,  or lesions. Mouth:  No deformity or lesions.   Neck:  Supple; no masses or thyromegaly. Lungs:  Clear throughout to auscultation.   No wheezes. Heart:  Regular rate and rhythm; no murmurs. Abdomen:  Soft, nontender,  nondistended, normal bowel sounds, no rebound or guarding. No hepatosplenomegaly.   Rectal:  Deferred  Msk:  Symmetrical. No boney deformities LAD: No inguinal or umbilical LAD Extremities:  No clubbing or edema. Neurologic:  Alert and  oriented x4;  grossly nonfocal Skin:  Intact without significant lesions or rashes. Psych:  Alert and cooperative. Normal mood and affect.     Rozann Holts L. Tarri Glenn, MD, MPH 03/03/2020, 2:23 PM

## 2020-03-03 NOTE — Telephone Encounter (Signed)
Patient called and was checking on the status of the referral to pain management. I did not see this in the note from the last visit. She was under the impression that you would place the referral. Please advise.

## 2020-03-03 NOTE — Patient Instructions (Addendum)
If you are age 63 or older, your body mass index should be between 23-30. Your Body mass index is 37.03 kg/m. If this is out of the aforementioned range listed, please consider follow up with your Primary Care Provider.  If you are age 89 or younger, your body mass index should be between 19-25. Your Body mass index is 37.03 kg/m. If this is out of the aformentioned range listed, please consider follow up with your Primary Care Provider.   You have been scheduled for a colonoscopy. Please follow written instructions given to you at your visit today.  Please pick up your prep supplies at the pharmacy within the next 1-3 days. If you use inhalers (even only as needed), please bring them with you on the day of your procedure.  You will be contacted by our office prior to your procedure for directions on holding your Xarelto.  If you do not hear from our office 1 week prior to your scheduled procedure, please call [phone] to discuss.   It was a pleasure to see you today!  Dr.Beavers

## 2020-03-03 NOTE — Telephone Encounter (Signed)
Please make sure the patient's office visit 9/20/with Dr Gardiner Rhyme is noted as Pre Op Clearance. I will notify operating surgeon's office.  Kerin Ransom PA-C 03/03/2020 3:18 PM

## 2020-03-03 NOTE — Telephone Encounter (Signed)
This patient has an office visit scheduled for 03/30/2020 with Dr Gardiner Rhyme.  Her procedure is not until 04/30/2020.  Pre op clearance can be addressed at that visit.    Kerin Ransom PA-C 03/03/2020 3:16 PM

## 2020-03-04 NOTE — Telephone Encounter (Signed)
Patient called back and was requesting the number to pain management since she had lost the number. It was given to her and it was [phone] with extension [phone]. The patient did not have any other questions.

## 2020-03-05 ENCOUNTER — Telehealth: Payer: Self-pay | Admitting: *Deleted

## 2020-03-05 NOTE — Telephone Encounter (Signed)
Patient notified of sleep study results. All questions were answered to her satisfaction.

## 2020-03-05 NOTE — Telephone Encounter (Signed)
Patient returning call.

## 2020-03-05 NOTE — Telephone Encounter (Signed)
Called patient to discuss sleep study results. Not able to make contact. Will try again later or tomorrow.

## 2020-03-09 ENCOUNTER — Other Ambulatory Visit: Payer: Self-pay

## 2020-03-09 ENCOUNTER — Ambulatory Visit (INDEPENDENT_AMBULATORY_CARE_PROVIDER_SITE_OTHER): Payer: Medicaid Other | Admitting: Family Medicine

## 2020-03-09 ENCOUNTER — Encounter (INDEPENDENT_AMBULATORY_CARE_PROVIDER_SITE_OTHER): Payer: Self-pay | Admitting: Family Medicine

## 2020-03-09 VITALS — BP 98/63 | HR 53 | Temp 97.8°F | Ht 72.0 in | Wt 278.8 lb

## 2020-03-09 DIAGNOSIS — Z1159 Encounter for screening for other viral diseases: Secondary | ICD-10-CM | POA: Diagnosis not present

## 2020-03-09 DIAGNOSIS — M1711 Unilateral primary osteoarthritis, right knee: Secondary | ICD-10-CM | POA: Diagnosis not present

## 2020-03-09 DIAGNOSIS — F319 Bipolar disorder, unspecified: Secondary | ICD-10-CM

## 2020-03-09 DIAGNOSIS — Z794 Long term (current) use of insulin: Secondary | ICD-10-CM | POA: Diagnosis not present

## 2020-03-09 DIAGNOSIS — E1100 Type 2 diabetes mellitus with hyperosmolarity without nonketotic hyperglycemic-hyperosmolar coma (NKHHC): Secondary | ICD-10-CM

## 2020-03-09 DIAGNOSIS — I482 Chronic atrial fibrillation, unspecified: Secondary | ICD-10-CM | POA: Diagnosis not present

## 2020-03-09 DIAGNOSIS — Z7689 Persons encountering health services in other specified circumstances: Secondary | ICD-10-CM | POA: Diagnosis not present

## 2020-03-09 LAB — POCT GLYCOSYLATED HEMOGLOBIN (HGB A1C): Hemoglobin A1C: 8.3 % — AB (ref 4.0–5.6)

## 2020-03-09 LAB — POCT CBG (FASTING - GLUCOSE)-MANUAL ENTRY: Glucose Fasting, POC: 221 mg/dL — AB (ref 70–99)

## 2020-03-09 MED ORDER — OXYBUTYNIN CHLORIDE 5 MG PO TABS: ORAL_TABLET | ORAL | 3 refills | Status: DC

## 2020-03-09 MED ORDER — LANTUS SOLOSTAR 100 UNIT/ML ~~LOC~~ SOPN: 10.0000 [IU] | PEN_INJECTOR | Freq: Every day | SUBCUTANEOUS | 3 refills | Status: DC

## 2020-03-09 MED ORDER — HUMALOG 100 UNIT/ML ~~LOC~~ SOCT: SUBCUTANEOUS | 3 refills | Status: DC

## 2020-03-09 NOTE — Patient Instructions (Signed)
For blood sugars 0-150 give 0 units of insulin,  151-200 give 2 units of insulin,  201-250 give 4 units, 251-300 give 6 units,  301-350 give 8 units,  351-400 give 10 units, Greater than 400 give 12 units and call M.D.

## 2020-03-09 NOTE — Progress Notes (Signed)
Subjective:  Patient ID: Rhonda Hickman, female    DOB: Aug 05, 1955  Age: 64 y.o. MRN: [id number]  CC: Diabetes and Medication Refill (insulin pen)   HPI Rhonda Hickman is a 64 year old female with a history of Type 2 DM (A1c 8.3), A.fib, Bipolar disorder, R knee OA here for chronic disease management. Hospitalized in 12/2019 for Hyperkalemia and last potassum level was 5.0  She does not check her blood sugars at home but endorses compliance with her medications - Januvia, Metformin and Novolog 12 U tid for her Diabetes. Does not exercise because of pain in her knees. Compliance with a diabetic diet cannot be ascertained. She goes to [facility] for Bipolar management and is on Psychotropic medications.  Compliant with Xarelto for Afib and denies presence of palpitations, chest pain, dyspnea. Not seen by Cardiology in a while. She has no additional concerns today.   Past Medical History:  Diagnosis Date   Anemia    Anxiety    Asthma    Atrial fibrillation ([place])    Bipolar 1 disorder (HCC)    Bulging lumbar disc    Chronic pain of left knee    COPD (chronic obstructive pulmonary disease) (HCC)    CVA (cerebral vascular accident) ([place])    Diabetes mellitus    Neuropathy    TIA (transient ischemic attack)     Past Surgical History:  Procedure Laterality Date   ABDOMINAL HYSTERECTOMY     CARPAL TUNNEL RELEASE Right    CHOLECYSTECTOMY     KNEE ARTHROSCOPY WITH MEDIAL MENISECTOMY Right 04/14/2016   Procedure: KNEE ARTHROSCOPY WITH MEDIAL AND LATERAL MENISECTOMY, MICROFRACTURE REPAIR;  Surgeon: Carole Civil, MD;  Location: [facility];  Service: Orthopedics;  Laterality: Right;  lateral menisectomy - needs crutch training    Family History  Problem Relation Age of Onset   Diabetes Mother    Heart attack Father     Allergies  Allergen Reactions   Aspirin Nausea Only and Other (See Comments)    Causes stomach pain    Outpatient Medications Prior to Visit   Medication Sig Dispense Refill   albuterol (VENTOLIN HFA) 108 (90 Base) MCG/ACT inhaler Inhale 2 puffs into the lungs every 6 (six) hours as needed for wheezing or shortness of breath. 8 g 1   amLODipine (NORVASC) 10 MG tablet Take 1 tablet (10 mg total) by mouth daily. 90 tablet 3   budesonide-formoterol (SYMBICORT) 160-4.5 MCG/ACT inhaler Inhale 2 puffs into the lungs 2 (two) times daily. 1 Inhaler 3   chlorhexidine (PERIDEX) 0.12 % solution RINSE MOUTH WITH 1 CAPFULLX30 SECONDS MORNING AND EVENING AFTER BRUSHING. THEN SPIT, NO SWALLOW     citalopram (CELEXA) 40 MG tablet Take 40 mg by mouth daily.     divalproex (DEPAKOTE ER) 500 MG 24 hr tablet Take 500-1,000 mg by mouth See admin instructions. 500MG  in the morning and 1000mg  at bedtime     ferrous sulfate 324 MG TBEC Take 1 tablet (324 mg total) by mouth daily with breakfast. 90 tablet 1   gabapentin (NEURONTIN) 300 MG capsule Take 1 capsule (300 mg total) by mouth 3 (three) times daily. 270 capsule 11   Insulin Syringe-Needle U-100 (ADVOCATE INSULIN SYRINGE) 29G X 1/2" 0.3 ML MISC 026 applicators by Does not apply route 2 (two) times daily after a meal. 200 each 6   JANUVIA 100 MG tablet TAKE 1 TABLET DAILY 30 tablet 2   meloxicam (MOBIC) 7.5 MG tablet Take 1 tablet (7.5 mg total) by  mouth daily. 30 tablet 5   metFORMIN (GLUCOPHAGE) 1000 MG tablet Take 1 tablet (1,000 mg total) by mouth 2 (two) times daily with a meal. 180 tablet 3   metoprolol tartrate (LOPRESSOR) 25 MG tablet Take 1 tablet (25 mg total) by mouth 2 (two) times daily. 180 tablet 3   PEG-KCl-NaCl-NaSulf-Na Asc-C (PLENVU) 140 g SOLR Take 140 g by mouth as directed. Manufacturer's coupon Universal coupon code:BIN: P2366821; GROUP: [id number]; PCN: [id number]; ID: [id number]; PAY NO MORE $50 1 each 0   rosuvastatin (CRESTOR) 10 MG tablet Take 1 tablet (10 mg total) by mouth daily. 90 tablet 3   traMADol (ULTRAM) 50 MG tablet Take 1 tablet (50 mg total) by mouth every 6  (six) hours as needed. 30 tablet 0   Vitamin D, Ergocalciferol, (DRISDOL) 1.25 MG (50000 UNIT) CAPS capsule Take 1 capsule (50,000 Units total) by mouth every 7 (seven) days. 32 capsule 0   XARELTO 20 MG TABS tablet TAKE 1 TABLET WITH SUPPER 30 tablet 0   insulin lispro (HUMALOG) 100 UNIT/ML cartridge Inject 0.12 mLs (12 Units total) into the skin 3 (three) times daily with meals. Patient has pens at home she will use them first than call for refills 15 mL 3   oxybutynin (DITROPAN) 5 MG tablet Take 1 tablet 2 (two) times daily. 60 tablet 0   No facility-administered medications prior to visit.     ROS Review of Systems  Constitutional: Negative for activity change, appetite change and fatigue.  HENT: Negative for congestion, sinus pressure and sore throat.   Eyes: Negative for visual disturbance.  Respiratory: Negative for cough, chest tightness, shortness of breath and wheezing.   Cardiovascular: Negative for chest pain and palpitations.  Gastrointestinal: Negative for abdominal distention, abdominal pain and constipation.  Endocrine: Negative for polydipsia.  Genitourinary: Negative for dysuria and frequency.  Musculoskeletal:       See HPI  Skin: Negative for rash.  Neurological: Negative for tremors, light-headedness and numbness.  Hematological: Does not bruise/bleed easily.  Psychiatric/Behavioral: Negative for agitation and behavioral problems.    Objective:  BP 98/63 (BP Location: Left Arm, Patient Position: Sitting, Cuff Size: Large)    Pulse (!) 53    Temp 97.8 F (36.6 C) (Oral)    Ht 6' (1.829 m)    Wt 278 lb 12.8 oz (126.5 kg)    SpO2 99%    BMI 37.81 kg/m   BP/Weight 03/09/2020 6/75/9163 02/11/6658  Systolic BP 98 935 701  Diastolic BP 63 70 71  Wt. (Lbs) 278.8 273 279  BMI 37.81 37.03 37.84      Physical Exam Constitutional:      Appearance: She is well-developed.  Neck:     Vascular: No JVD.  Cardiovascular:     Rate and Rhythm: Normal rate.     Heart  sounds: Normal heart sounds. No murmur heard.   Pulmonary:     Effort: Pulmonary effort is normal.     Breath sounds: Normal breath sounds. No wheezing or rales.  Chest:     Chest wall: No tenderness.  Abdominal:     General: Bowel sounds are normal. There is no distension.     Palpations: Abdomen is soft. There is no mass.     Tenderness: There is no abdominal tenderness.  Musculoskeletal:        General: Normal range of motion.     Right lower leg: No edema.     Left lower leg: No edema.  Neurological:  Mental Status: She is alert and oriented to person, place, and time.  Psychiatric:        Mood and Affect: Mood normal.     CMP Latest Ref Rng & Units 12/19/2019 12/18/2019 12/18/2019  Glucose 70 - 99 mg/dL 134(H) 127(H) 168(H)  BUN 8 - 23 mg/dL 19 22 22   Creatinine 0.44 - 1.00 mg/dL 1.17(H) 1.40(H) 1.30(H)  Sodium 135 - 145 mmol/L 136 135 133(L)  Potassium 3.5 - 5.1 mmol/L 5.0 6.1(H) 6.2(H)  Chloride 98 - 111 mmol/L 103 106 105  CO2 22 - 32 mmol/L 25 24 22   Calcium 8.9 - 10.3 mg/dL 9.3 9.5 9.3  Total Protein 6.0 - 8.5 g/dL - - -  Total Bilirubin 0.0 - 1.2 mg/dL - - -  Alkaline Phos 48 - 121 IU/L - - -  AST 0 - 40 IU/L - - -  ALT 0 - 32 IU/L - - -    Lipid Panel     Component Value Date/Time   CHOL 146 08/04/2019 0548   TRIG 161 (H) 08/04/2019 0548   HDL 13 (L) 08/04/2019 0548   CHOLHDL 11.2 08/04/2019 0548   VLDL 32 08/04/2019 0548   LDLCALC 101 (H) 08/04/2019 0548    CBC    Component Value Date/Time   WBC 6.1 12/18/2019 0553   RBC 3.99 12/18/2019 0553   HGB 11.1 (L) 12/18/2019 0553   HGB 11.9 11/28/2019 1510   HCT 35.1 (L) 12/18/2019 0553   HCT 35.0 11/28/2019 1510   PLT 221 12/18/2019 0553   PLT 428 11/28/2019 1510   MCV 88.0 12/18/2019 0553   MCV 84 11/28/2019 1510   MCH 27.8 12/18/2019 0553   MCHC 31.6 12/18/2019 0553   RDW 15.8 (H) 12/18/2019 0553   RDW 15.6 (H) 11/28/2019 1510   LYMPHSABS 3.3 12/17/2019 1103   LYMPHSABS 4.2 (H) 11/28/2019 1510    MONOABS 0.6 12/17/2019 1103   EOSABS 0.2 12/17/2019 1103   EOSABS 0.4 11/28/2019 1510   BASOSABS 0.0 12/17/2019 1103   BASOSABS 0.1 11/28/2019 1510    Lab Results  Component Value Date   HGBA1C 8.3 (A) 03/09/2020    Assessment & Plan:  1. Type 2 diabetes mellitus with 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, with long-term current use of insulin (HCC) Uncontrolled with A1c of 8.3; goal is <7.0 Novolog switched from fixed regimen to sliding scale Lantus initiated. Counseled on Diabetic diet, my plate method, 676 minutes of moderate intensity exercise/week Blood sugar logs with fasting goals of 80-120 mg/dl, random of less than 180 and in the event of sugars less than 60 mg/dl or greater than 400 mg/dl encouraged to notify the clinic. Advised on the need for annual eye exams, annual foot exams, Pneumonia vaccine. - HgB A1c - Glucose (CBG), Fasting - insulin glargine (LANTUS SOLOSTAR) 100 UNIT/ML Solostar Pen; Inject 10 Units into the skin daily.  Dispense: 15 mL; Refill: 3 - insulin lispro (HUMALOG) 100 UNIT/ML cartridge; 0-12 units 3 times daily before meals as per sliding scale  Dispense: 15 mL; Refill: 3 - Basic Metabolic Panel; Future - Microalbumin / creatinine urine ratio; Future  2. Need for hepatitis C screening test - HCV RNA quant rflx ultra or genotyp([facility]); Future  3. Arthritis of knee, right Uncontrolled Currently on Meloxicam Followed by [facility]  4. Chronic atrial fibrillation (HCC) Stable Continue Xarelto  5. Bipolar 1 disorder ([place]) Stable Management as per Psych.     Meds ordered this encounter  Medications   insulin glargine (LANTUS SOLOSTAR) 100  UNIT/ML Solostar Pen    Sig: Inject 10 Units into the skin daily.    Dispense:  15 mL    Refill:  3   insulin lispro (HUMALOG) 100 UNIT/ML cartridge    Sig: 0-12 units 3 times daily before meals as per sliding scale    Dispense:  15 mL    Refill:  3    For blood sugars 0-150 give 0 units of  insulin, 151-200 give 2 units of insulin, 201-250 give 4 units, 251-300 give 6 units, 301-350 give 8 units, 351-400 give 10 units,> 400 give 12 units and call M.D.   oxybutynin (DITROPAN) 5 MG tablet    Sig: Take 1 tablet 2 (two) times daily.    Dispense:  60 tablet    Refill:  3    Follow-up: Return in about 3 months (around 06/09/2020) for medical conditions.       Charlott Rakes, MD, FAAFP. [facility] [place], [place]   03/09/2020, 11:18 AM

## 2020-03-12 DIAGNOSIS — Z7689 Persons encountering health services in other specified circumstances: Secondary | ICD-10-CM | POA: Diagnosis not present

## 2020-03-17 ENCOUNTER — Observation Stay (HOSPITAL_COMMUNITY)
Admission: EM | Admit: 2020-03-17 | Discharge: 2020-03-18 | Disposition: A | Discharge status: Home / Self Care | Payer: Medicaid Other | Attending: Internal Medicine | Admitting: Internal Medicine

## 2020-03-17 ENCOUNTER — Other Ambulatory Visit: Payer: Self-pay

## 2020-03-17 ENCOUNTER — Emergency Department (HOSPITAL_COMMUNITY): Payer: Medicaid Other

## 2020-03-17 ENCOUNTER — Encounter (HOSPITAL_COMMUNITY): Payer: Self-pay | Admitting: *Deleted

## 2020-03-17 DIAGNOSIS — Z7982 Long term (current) use of aspirin: Secondary | ICD-10-CM | POA: Insufficient documentation

## 2020-03-17 DIAGNOSIS — I482 Chronic atrial fibrillation, unspecified: Secondary | ICD-10-CM | POA: Diagnosis present

## 2020-03-17 DIAGNOSIS — S0990XA Unspecified injury of head, initial encounter: Secondary | ICD-10-CM | POA: Diagnosis not present

## 2020-03-17 DIAGNOSIS — Z20822 Contact with and (suspected) exposure to covid-19: Secondary | ICD-10-CM | POA: Insufficient documentation

## 2020-03-17 DIAGNOSIS — F17210 Nicotine dependence, cigarettes, uncomplicated: Secondary | ICD-10-CM | POA: Diagnosis not present

## 2020-03-17 DIAGNOSIS — D649 Anemia, unspecified: Secondary | ICD-10-CM | POA: Diagnosis not present

## 2020-03-17 DIAGNOSIS — J45901 Unspecified asthma with (acute) exacerbation: Secondary | ICD-10-CM | POA: Diagnosis not present

## 2020-03-17 DIAGNOSIS — R4182 Altered mental status, unspecified: Principal | ICD-10-CM | POA: Diagnosis present

## 2020-03-17 DIAGNOSIS — R55 Syncope and collapse: Secondary | ICD-10-CM

## 2020-03-17 DIAGNOSIS — Z79899 Other long term (current) drug therapy: Secondary | ICD-10-CM | POA: Insufficient documentation

## 2020-03-17 DIAGNOSIS — F319 Bipolar disorder, unspecified: Secondary | ICD-10-CM | POA: Diagnosis present

## 2020-03-17 DIAGNOSIS — R404 Transient alteration of awareness: Secondary | ICD-10-CM | POA: Diagnosis not present

## 2020-03-17 DIAGNOSIS — R220 Localized swelling, mass and lump, head: Secondary | ICD-10-CM | POA: Diagnosis not present

## 2020-03-17 DIAGNOSIS — I517 Cardiomegaly: Secondary | ICD-10-CM | POA: Diagnosis not present

## 2020-03-17 DIAGNOSIS — R400 Somnolence: Secondary | ICD-10-CM | POA: Diagnosis not present

## 2020-03-17 DIAGNOSIS — W19XXXA Unspecified fall, initial encounter: Secondary | ICD-10-CM | POA: Diagnosis not present

## 2020-03-17 DIAGNOSIS — I10 Essential (primary) hypertension: Secondary | ICD-10-CM | POA: Diagnosis not present

## 2020-03-17 DIAGNOSIS — E119 Type 2 diabetes mellitus without complications: Secondary | ICD-10-CM | POA: Diagnosis not present

## 2020-03-17 DIAGNOSIS — Z8673 Personal history of transient ischemic attack (TIA), and cerebral infarction without residual deficits: Secondary | ICD-10-CM | POA: Insufficient documentation

## 2020-03-17 DIAGNOSIS — R4781 Slurred speech: Secondary | ICD-10-CM | POA: Diagnosis not present

## 2020-03-17 DIAGNOSIS — J441 Chronic obstructive pulmonary disease with (acute) exacerbation: Secondary | ICD-10-CM | POA: Diagnosis not present

## 2020-03-17 DIAGNOSIS — R0689 Other abnormalities of breathing: Secondary | ICD-10-CM | POA: Diagnosis not present

## 2020-03-17 LAB — BASIC METABOLIC PANEL
Anion gap: 13 (ref 5–15)
BUN: 15 mg/dL (ref 8–23)
CO2: 23 mmol/L (ref 22–32)
Calcium: 8.8 mg/dL — ABNORMAL LOW (ref 8.9–10.3)
Chloride: 103 mmol/L (ref 98–111)
Creatinine, Ser: 0.91 mg/dL (ref 0.44–1.00)
GFR calc Af Amer: >60 mL/min (ref >60)
GFR calc non Af Amer: >60 mL/min (ref >60)
Glucose, Bld: 109 mg/dL — ABNORMAL HIGH (ref 70–99)
Potassium: 4.2 mmol/L (ref 3.5–5.1)
Sodium: 139 mmol/L (ref 135–145)

## 2020-03-17 LAB — URINALYSIS, ROUTINE W REFLEX MICROSCOPIC
Bilirubin Urine: NEGATIVE
Glucose, UA: NEGATIVE mg/dL
Ketones, ur: NEGATIVE mg/dL
Leukocytes,Ua: NEGATIVE
Nitrite: NEGATIVE
Protein, ur: NEGATIVE mg/dL
Specific Gravity, Urine: 1.012 (ref 1.005–1.030)
pH: 5 (ref 5.0–8.0)

## 2020-03-17 LAB — CBC WITH DIFFERENTIAL/PLATELET
Abs Immature Granulocytes: 0.03 10*3/uL (ref 0.00–0.07)
Basophils Absolute: 0 10*3/uL (ref 0.0–0.1)
Basophils Relative: 0 %
Eosinophils Absolute: 0.1 10*3/uL (ref 0.0–0.5)
Eosinophils Relative: 1 %
HCT: 35.9 % — ABNORMAL LOW (ref 36.0–46.0)
Hemoglobin: 11.5 g/dL — ABNORMAL LOW (ref 12.0–15.0)
Immature Granulocytes: 0 %
Lymphocytes Relative: 35 %
Lymphs Abs: 3.5 10*3/uL (ref 0.7–4.0)
MCH: 28.9 pg (ref 26.0–34.0)
MCHC: 32 g/dL (ref 30.0–36.0)
MCV: 90.2 fL (ref 80.0–100.0)
Monocytes Absolute: 0.8 10*3/uL (ref 0.1–1.0)
Monocytes Relative: 8 %
Neutro Abs: 5.7 10*3/uL (ref 1.7–7.7)
Neutrophils Relative %: 56 %
Platelets: 206 10*3/uL (ref 150–400)
RBC: 3.98 MIL/uL (ref 3.87–5.11)
RDW: 14.8 % (ref 11.5–15.5)
WBC: 10.2 10*3/uL (ref 4.0–10.5)
nRBC: 0 % (ref 0.0–0.2)

## 2020-03-17 LAB — SARS CORONAVIRUS 2 BY RT PCR (HOSPITAL ORDER, PERFORMED IN ~~LOC~~ HOSPITAL LAB): SARS Coronavirus 2: NEGATIVE

## 2020-03-17 LAB — RAPID URINE DRUG SCREEN, HOSP PERFORMED
Amphetamines: NOT DETECTED
Barbiturates: NOT DETECTED
Benzodiazepines: POSITIVE — AB
Cocaine: NOT DETECTED
Opiates: NOT DETECTED
Tetrahydrocannabinol: NOT DETECTED

## 2020-03-17 LAB — CBG MONITORING, ED: Glucose-Capillary: 106 mg/dL — ABNORMAL HIGH (ref 70–99)

## 2020-03-17 MED ORDER — ONDANSETRON HCL 4 MG PO TABS: 4.0000 mg | ORAL_TABLET | Freq: Four times a day (QID) | ORAL | Status: DC | PRN

## 2020-03-17 MED ORDER — ACETAMINOPHEN 650 MG RE SUPP: 650.0000 mg | Freq: Four times a day (QID) | RECTAL | Status: DC | PRN

## 2020-03-17 MED ORDER — ENOXAPARIN SODIUM 40 MG/0.4ML ~~LOC~~ SOLN: 40.0000 mg | SUBCUTANEOUS | Status: DC

## 2020-03-17 MED ORDER — ONDANSETRON HCL 4 MG/2ML IJ SOLN: 4.0000 mg | Freq: Four times a day (QID) | INTRAMUSCULAR | Status: DC | PRN

## 2020-03-17 MED ORDER — NALOXONE HCL 0.4 MG/ML IJ SOLN
0.4000 mg | Freq: Once | INTRAMUSCULAR | Status: AC
  Administered 2020-03-17: 0.4 mg via INTRAVENOUS
  Filled 2020-03-17: qty 1

## 2020-03-17 MED ORDER — ACETAMINOPHEN 325 MG PO TABS
650.0000 mg | ORAL_TABLET | Freq: Four times a day (QID) | ORAL | Status: DC | PRN
  Administered 2020-03-18: 650 mg via ORAL
  Filled 2020-03-17: qty 2

## 2020-03-17 MED ORDER — SODIUM CHLORIDE 0.9 % IV SOLN: INTRAVENOUS | Status: DC

## 2020-03-17 NOTE — ED Provider Notes (Signed)
[facility] EMERGENCY DEPARTMENT Provider Note   CSN: [id number] Arrival date & time: 03/17/20  1754     History Chief Complaint  Patient presents with  . Loss of Consciousness   LEVEL 5 CAVEAT - ALTERED LEVEL OF CONSCIOUSNESS  Rhonda Hickman is a 63 y.o. female with PMHx A fib, Diabetes, CVA on Xarelto, Bipolar disorder who presents to the ED today via EMS for LOC. Per EMS witnesses called after seeing pt walking and then falling to the ground. Pt states she takes 1/2 xanax per day and denied taking anything else. Unable to provide much additional information.   The history is provided by the patient, medical records and the EMS personnel.       Past Medical History:  Diagnosis Date  . Anemia   . Anxiety   . Asthma   . Atrial fibrillation ([place])   . Bipolar 1 disorder ([place])   . Bulging lumbar disc   . Chronic pain of left knee   . COPD (chronic obstructive pulmonary disease) ([place])   . CVA (cerebral vascular accident) ([place])   . Diabetes mellitus   . Neuropathy   . TIA (transient ischemic attack)     Patient Active Problem List   Diagnosis Date Noted  . Bipolar 1 disorder ([place])   . AKI (acute kidney injury) ([place]) 12/18/2019  . Hyperkalemia 12/17/2019  . Chronic atrial fibrillation ([place]) 12/03/2019  . TIA (transient ischemic attack) 12/03/2019  . Diabetic peripheral neuropathy ([place]) 12/03/2019  . Chronic migraine 12/03/2019  . Vertigo 08/03/2019  . Slurred speech 08/03/2019  . Leukocytosis 08/03/2019  . Bronchiectasis with acute exacerbation ([place])   . Obesity, Class III, BMI 40-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) ([place])   . Gastroesophageal reflux disease   . Asthma exacerbation 06/06/2018  . Gait abnormality 08/29/2017  . Urinary incontinence 08/29/2017  . Paresthesia 08/29/2017  . Derangement of posterior horn of medial meniscus of right knee   . Meniscus, lateral, derangement, right   . Arthritis of knee, right   . COPD with acute exacerbation ([place]) 03/09/2016  . Type 2  diabetes mellitus ([place]) 02/16/2007  . Cocaine abuse ([place]) 02/16/2007  . Extrinsic asthma 02/16/2007  . HOMELESSNESS, HX OF 02/16/2007    Past Surgical History:  Procedure Laterality Date  . ABDOMINAL HYSTERECTOMY    . CARPAL TUNNEL RELEASE Right   . CHOLECYSTECTOMY    . KNEE ARTHROSCOPY WITH MEDIAL MENISECTOMY Right 04/14/2016   Procedure: KNEE ARTHROSCOPY WITH MEDIAL AND LATERAL MENISECTOMY, MICROFRACTURE REPAIR;  Surgeon: Carole Civil, MD;  Location: [facility];  Service: Orthopedics;  Laterality: Right;  lateral menisectomy - needs crutch training     OB History    Gravida      Para      Term      Preterm      AB      Living  0     SAB      TAB      Ectopic      Multiple      Live Births              Family History  Problem Relation Age of Onset  . Diabetes Mother   . Heart attack Father     Social History   Tobacco Use  . Smoking status: Current Some Day Smoker    Packs/day: 0.50    Types: Cigarettes  . Smokeless tobacco: Never Used  . Tobacco comment: tobacco info given  Vaping Use  . Vaping  Use: Some days  Substance Use Topics  . Alcohol use: No  . Drug use: No    Home Medications Prior to Admission medications   Medication Sig Start Date End Date Taking? Authorizing Provider  albuterol (VENTOLIN HFA) 108 (90 Base) MCG/ACT inhaler Inhale 2 puffs into the lungs every 6 (six) hours as needed for wheezing or shortness of breath. 09/15/19   [name], NP  amLODipine (NORVASC) 10 MG tablet Take 1 tablet (10 mg total) by mouth daily. 09/15/19   [name], NP  budesonide-formoterol (SYMBICORT) 160-4.5 MCG/ACT inhaler Inhale 2 puffs into the lungs 2 (two) times daily. 09/15/19   [name], NP  chlorhexidine (PERIDEX) 0.12 % solution RINSE MOUTH WITH 1 CAPFULLX30 SECONDS MORNING AND EVENING AFTER BRUSHING. THEN SPIT, NO SWALLOW 11/19/19   [provider]  citalopram (CELEXA) 40 MG tablet Take 40 mg by mouth daily.  12/02/19   [provider]  divalproex (DEPAKOTE ER) 500 MG 24 hr tablet Take 500-1,000 mg by mouth See admin instructions. 500MG  in the morning and 1000mg  at bedtime 03/26/18   [provider]  ferrous sulfate 324 MG TBEC Take 1 tablet (324 mg total) by mouth daily with breakfast. 09/15/19   [name], NP  gabapentin (NEURONTIN) 300 MG capsule Take 1 capsule (300 mg total) by mouth 3 (three) times daily. 12/19/19   Johnson, Clanford L, MD  insulin glargine (LANTUS SOLOSTAR) 100 UNIT/ML Solostar Pen Inject 10 Units into the skin daily. 03/09/20   Charlott Rakes, MD  insulin lispro (HUMALOG) 100 UNIT/ML cartridge 0-12 units 3 times daily before meals as per sliding scale 03/09/20   Charlott Rakes, MD  Insulin Syringe-Needle U-100 (ADVOCATE INSULIN SYRINGE) 29G X 1/2" 0.3 ML MISC 416 applicators by Does not apply route 2 (two) times daily after a meal. 01/14/20   [name], NP  JANUVIA 100 MG tablet TAKE 1 TABLET DAILY 02/27/20   Charlott Rakes, MD  meloxicam (MOBIC) 7.5 MG tablet Take 1 tablet (7.5 mg total) by mouth daily. 12/25/19   Carole Civil, MD  metFORMIN (GLUCOPHAGE) 1000 MG tablet Take 1 tablet (1,000 mg total) by mouth 2 (two) times daily with a meal. 09/15/19   [name], NP  metoprolol tartrate (LOPRESSOR) 25 MG tablet Take 1 tablet (25 mg total) by mouth 2 (two) times daily. 12/16/19 03/15/20  [name], MD  oxybutynin (DITROPAN) 5 MG tablet Take 1 tablet 2 (two) times daily. 03/09/20   Charlott Rakes, MD  PEG-KCl-NaCl-NaSulf-Na Asc-C (PLENVU) 140 g SOLR Take 140 g by mouth as directed. Manufacturer's coupon Universal coupon code:BIN: P2366821; GROUP: [id number]; PCN: [id number]; ID: [id number]; PAY NO MORE $50 03/03/20   Thornton Park, MD  rosuvastatin (CRESTOR) 10 MG tablet Take 1 tablet (10 mg total) by mouth daily. 12/16/19 03/15/20  [name], MD  traMADol (ULTRAM) 50 MG tablet Take 1 tablet (50 mg total) by mouth every 6  (six) hours as needed. 08/21/19   Carole Civil, MD  Vitamin D, Ergocalciferol, (DRISDOL) 1.25 MG (50000 UNIT) CAPS capsule Take 1 capsule (50,000 Units total) by mouth every 7 (seven) days. 11/28/19   [name], NP  XARELTO 20 MG TABS tablet TAKE 1 TABLET WITH SUPPER 02/04/20   [name], NP    Allergies    Aspirin  Review of Systems   Review of Systems  Unable to perform ROS: Mental status change  Neurological: Positive for syncope.    Physical  Exam Updated Vital Signs BP 118/63   Pulse (!) 57   Resp 18   SpO2 (!) 89%   Physical Exam Vitals and nursing note reviewed.  Constitutional:      Appearance: She is not diaphoretic.     Comments: Drowsy, however responds to verbal stimuli  HENT:     Head: Normocephalic and atraumatic.  Eyes:     Conjunctiva/sclera: Conjunctivae normal.  Cardiovascular:     Rate and Rhythm: Normal rate and regular rhythm.  Pulmonary:     Effort: Pulmonary effort is normal.     Breath sounds: Normal breath sounds. No wheezing, rhonchi or rales.  Abdominal:     Palpations: Abdomen is soft.     Tenderness: There is no abdominal tenderness. There is no guarding or rebound.  Musculoskeletal:     Cervical back: Neck supple.  Skin:    General: Skin is warm and dry.  Neurological:     Mental Status: She is alert.     Comments: Able to follow commands however has to be redirected. MAE without difficulty.      ED Results / Procedures / Treatments   Labs (all labs ordered are listed, but only abnormal results are displayed) Labs Reviewed  BASIC METABOLIC PANEL  CBC WITH DIFFERENTIAL/PLATELET  URINALYSIS, ROUTINE W REFLEX MICROSCOPIC  RAPID URINE DRUG SCREEN, HOSP PERFORMED    EKG None  Radiology CT Head Wo Contrast  Result Date: 03/17/2020 CLINICAL DATA:  Acute pain due to trauma.  Syncope. EXAM: CT HEAD WITHOUT CONTRAST TECHNIQUE: Contiguous axial images were obtained from the base of the skull through the vertex  without intravenous contrast. COMPARISON:  September 09, 2019 FINDINGS: Brain: No hemorrhage. No extraaxial collection.No midline shift. The ventricular system is unremarkable.The basal cisterns are unremarkable. Patchy and confluent areas of decreased attenuation are noted throughout the deep and periventricular white matter of the cerebral hemispheres bilaterally, compatible with chronic microvascular ischemic disease. the brainstem is unremarkable. The cerebellum is unremarkable. The sella is unremarkable. Vascular: No hyperdense vessel or unexpected calcification. Skull: There is right posterior scalp swelling without evidence for an underlying calvarial fracture. The skull base is unremarkable. The visualized upper cervical spine is unremarkable. Sinuses/Orbits: The visualized orbits are unremarkable. The paranasal sinuses are unremarkable. The mastoid air cells are clear. Other: The visualized parotid gland is unremarkable. There is no scalp soft tissue swelling. IMPRESSION: 1. No acute intracranial abnormality. 2. Right posterior scalp swelling without evidence for an underlying calvarial fracture. 3. Chronic microvascular ischemic disease. Electronically Signed   By: Constance Holster M.D.   On: 03/17/2020 18:58    Procedures Procedures (including critical care time)  Medications Ordered in ED Medications  naloxone (NARCAN) injection 0.4 mg (has no administration in time range)  naloxone [facility]) injection 0.4 mg (0.4 mg Intravenous Given 03/17/20 1850)    ED Course  I have reviewed the triage vital signs and the nursing notes.  Pertinent labs & imaging results that were available during my care of the patient were reviewed by me and considered in my medical decision making (see chart for details).  Clinical Course as of Mar 17 2149  Tue Mar 17, 2020  2138 Benzodiazepines(!): POSITIVE [MV]    Clinical Course User Index [MV] Eustaquio Maize, PA-C   MDM Rules/Calculators/A&P                           64 year old female with a history of bipolar disorder and cocaine  abuse who presents to the ED today via EMS for syncopal episode that was witnessed by bystanders.  Patient was able to tell EMS that she only takes half a Xanax per day.  She denies any other drug or alcohol use.  Apparently while she was being wheeled into her room at this ED she was able to relay that she knew she was in the "psych corner."  On my exam patient is sleepy but easily arousable with verbal stimuli.  It is somewhat difficult to understand her with her history of stroke and dysphagia.  She is able to follow commands however needs to be redirected.  She is moving all extremities without difficulty.  It does appear she is on Xarelto.  We were uninformed patient had a head injury however we will plan for CT scan at this time to assess for any type of bleed.  Will provide Narcan at this time and reassess.  We will plan for labs and UDS.  CT scan without any bleed or skull fractures.  Patient does have some swelling to the right posterior scalp consistent with a head injury.  On reevaluation after patient returned to CT scan she does not appear any different after 0.4 mg Narcan.  Will provide additional dose.  Attending physician Dr. Roderic Palau has evaluated patient as well and agrees with plan.  UDS positive for Benzos. No other findings.  CBC without leukocytosis. Hgb stable.  BMP with glucose 109. No other findings.  U/A without infection.   On reevaluation pt continues to be sleepy. She is easily arousable still however cannot discharge home like this. At shift change case signed out to attending physician Dr. Roderic Palau who will monitor patient and attempt to ambulate for safety. Suspect pt may have taken too many benzos today vs possibility of other drug not picked up on our UDS. Pt is anticoagulated; she is nontachycardic; I very much doubt PE causing pt's to syncopize earlier today. May need admission if she cannot wake up  appropriately.   This note was prepared using Dragon voice recognition software and may include unintentional dictation errors due to the inherent limitations of voice recognition software.  Final Clinical Impression(s) / ED Diagnoses Final diagnoses:  None    Rx / DC Orders ED Discharge Orders    None       Eustaquio Maize, PA-C 03/17/20 2152    Milton Ferguson, MD 03/20/20 [phone]

## 2020-03-17 NOTE — H&P (Signed)
History and Physical    Audia Amick PZW:[id number] DOB: [birth year] DOA: 03/17/2020  PCP: [name], NP   Patient coming from: Home.   I have personally briefly reviewed patient's old medical records in [place]  Chief Complaint: Altered mental status.  HPI: Rhonda Hickman is a 64 y.o. female with medical history significant of anemia, anxiety, asthma, paroxysmal atrial fibrillation, bipolar 1 disorder, bulging lumbar disc, chronic pain in the left knee, COPD, history of CVA, history of TIA, type 2 diabetes, diabetic peripheral neuropathy who was brought to the emergency department via EMS due to a witnessed syncopal episode, where apparently the patient was walking and then fell to the ground.  During triage, the patient mentioned that she takes half a Xanax a day, but did not specify how much she took today.  She is still somnolent, wakes up briefly, but when asked if she remembers anything leading to her LOC, the patient denies and falls back to sleep again.  ED Course: Initial vital signs were temperature pulse 56 56, respiratory rate 22, BP 139/74 and O2 sat 93% on room air.  The patient was given 2 doses of naloxone 0.4 mg IVP.  Labs: Her urinalysis shows small hemoglobinuria and rare bacteria on microscopic examination.  UDS is positive for benzodiazepines.  CBC shows a white count of 10.2, hemoglobin 11.5 g/dL and platelets 206.  BMP shows a glucose of 109 and calcium of 8.8 mg/dL.  Alcohol level was normal.  I added hepatic function panel, salicylate, acetaminophen and TSH to her work-up.  Imaging: A one-view portable chest radiograph did not have any active disease, but it showed low lung volumes.  CT head did not show any acute intracranial abnormality.  Posterior scalp swelling without evidence for any underlying calvarial fracture.  There is chronic microvascular ischemic disease.  Please see images and full radiology report for further detail.  Review of Systems:  As per HPI otherwise all other systems reviewed and are negative.  Past Medical History:  Diagnosis Date  . Anemia   . Anxiety   . Asthma   . Atrial fibrillation ([place])   . Bipolar 1 disorder ([place])   . Bulging lumbar disc   . Chronic pain of left knee   . COPD (chronic obstructive pulmonary disease) ([place])   . CVA (cerebral vascular accident) ([place])   . Diabetes mellitus   . Neuropathy   . TIA (transient ischemic attack)    Past Surgical History:  Procedure Laterality Date  . ABDOMINAL HYSTERECTOMY    . CARPAL TUNNEL RELEASE Right   . CHOLECYSTECTOMY    . KNEE ARTHROSCOPY WITH MEDIAL MENISECTOMY Right 04/14/2016   Procedure: KNEE ARTHROSCOPY WITH MEDIAL AND LATERAL MENISECTOMY, MICROFRACTURE REPAIR;  Surgeon: Carole Civil, MD;  Location: [facility];  Service: Orthopedics;  Laterality: Right;  lateral menisectomy - needs crutch training   Social History  reports that she has been smoking cigarettes. She has been smoking about 0.50 packs per day. She has never used smokeless tobacco. She reports that she does not drink alcohol and does not use drugs.  Allergies  Allergen Reactions  . Aspirin Nausea Only and Other (See Comments)    Causes stomach pain   Family History  Problem Relation Age of Onset  . Diabetes Mother   . Heart attack Father    Prior to Admission medications   Medication Sig Start Date End Date Taking? Authorizing Provider  albuterol (VENTOLIN HFA) 108 (90 Base) MCG/ACT inhaler Inhale 2  puffs into the lungs every 6 (six) hours as needed for wheezing or shortness of breath. 09/15/19   [name], NP  amLODipine (NORVASC) 10 MG tablet Take 1 tablet (10 mg total) by mouth daily. 09/15/19   [name], NP  budesonide-formoterol (SYMBICORT) 160-4.5 MCG/ACT inhaler Inhale 2 puffs into the lungs 2 (two) times daily. 09/15/19   [name], NP  chlorhexidine (PERIDEX) 0.12 % solution RINSE MOUTH WITH 1 CAPFULLX30 SECONDS MORNING AND EVENING AFTER  BRUSHING. THEN SPIT, NO SWALLOW 11/19/19   [provider]  citalopram (CELEXA) 40 MG tablet Take 40 mg by mouth daily. 12/02/19   [provider]  divalproex (DEPAKOTE ER) 500 MG 24 hr tablet Take 500-1,000 mg by mouth See admin instructions. 500MG  in the morning and 1000mg  at bedtime 03/26/18   [provider]  ferrous sulfate 324 MG TBEC Take 1 tablet (324 mg total) by mouth daily with breakfast. 09/15/19   [name], NP  gabapentin (NEURONTIN) 300 MG capsule Take 1 capsule (300 mg total) by mouth 3 (three) times daily. 12/19/19   Johnson, Clanford L, MD  insulin glargine (LANTUS SOLOSTAR) 100 UNIT/ML Solostar Pen Inject 10 Units into the skin daily. 03/09/20   Charlott Rakes, MD  insulin lispro (HUMALOG) 100 UNIT/ML cartridge 0-12 units 3 times daily before meals as per sliding scale 03/09/20   Charlott Rakes, MD  Insulin Syringe-Needle U-100 (ADVOCATE INSULIN SYRINGE) 29G X 1/2" 0.3 ML MISC 627 applicators by Does not apply route 2 (two) times daily after a meal. 01/14/20   [name], NP  JANUVIA 100 MG tablet TAKE 1 TABLET DAILY 02/27/20   Charlott Rakes, MD  meloxicam (MOBIC) 7.5 MG tablet Take 1 tablet (7.5 mg total) by mouth daily. 12/25/19   Carole Civil, MD  metFORMIN (GLUCOPHAGE) 1000 MG tablet Take 1 tablet (1,000 mg total) by mouth 2 (two) times daily with a meal. 09/15/19   [name], NP  metoprolol tartrate (LOPRESSOR) 25 MG tablet Take 1 tablet (25 mg total) by mouth 2 (two) times daily. 12/16/19 03/15/20  [name], MD  oxybutynin (DITROPAN) 5 MG tablet Take 1 tablet 2 (two) times daily. 03/09/20   Charlott Rakes, MD  PEG-KCl-NaCl-NaSulf-Na Asc-C (PLENVU) 140 g SOLR Take 140 g by mouth as directed. Manufacturer's coupon Universal coupon code:BIN: P2366821; GROUP: [id number]; PCN: [id number]; ID: [id number]; PAY NO MORE $50 03/03/20   Thornton Park, MD  rosuvastatin (CRESTOR) 10 MG tablet Take 1 tablet (10 mg total) by mouth  daily. 12/16/19 03/15/20  [name], MD  traMADol (ULTRAM) 50 MG tablet Take 1 tablet (50 mg total) by mouth every 6 (six) hours as needed. 08/21/19   Carole Civil, MD  Vitamin D, Ergocalciferol, (DRISDOL) 1.25 MG (50000 UNIT) CAPS capsule Take 1 capsule (50,000 Units total) by mouth every 7 (seven) days. 11/28/19   [name], NP  XARELTO 20 MG TABS tablet TAKE 1 TABLET WITH SUPPER 02/04/20   [name], NP   Physical Exam: Vitals:   03/17/20 2130 03/17/20 2200 03/17/20 2230 03/17/20 2300  BP: 124/67 133/76 124/68 122/66  Pulse: (!) 57 (!) 55 (!) 55 (!) 54  Resp: 16 18 17 17   SpO2: 92% 92% 90% 92%   Constitutional: NAD, calm, comfortable Eyes: PERRL, lids and conjunctivae normal ENMT: Mucous membranes are moist. Posterior pharynx clear of any exudate or lesions.  Neck: normal, supple, no masses, no thyromegaly Respiratory: clear to auscultation bilaterally, no  wheezing, no crackles. Normal respiratory effort. No accessory muscle use.  Cardiovascular: Bradycardic at 56 bpm, no murmurs / rubs / gallops. No extremity edema. 2+ pedal pulses. No carotid bruits.  Abdomen: Obese, nondistended. Bowel sounds positive.  Soft, no tenderness, no masses palpated. No hepatosplenomegaly.  Musculoskeletal: no clubbing / cyanosis. Good ROM, no contractures.  Relaxed muscle tone.  Skin: no rashes, lesions, ulcers on limited dermatological examination. Neurologic: Somnolent.  Looks grossly nonfocal.  Unable to fully evaluate.  Psychiatric: Somnolent.  Wakes up briefly with verbal stimuli.   Labs on Admission: I have personally reviewed following labs and imaging studies  CBC: Recent Labs  Lab 03/17/20 2000  WBC 10.2  NEUTROABS 5.7  HGB 11.5*  HCT 35.9*  MCV 90.2  PLT 357    Basic Metabolic Panel: Recent Labs  Lab 03/17/20 2000  NA 139  K 4.2  CL 103  CO2 23  GLUCOSE 109*  BUN 15  CREATININE 0.91  CALCIUM 8.8*    GFR: Estimated Creatinine Clearance:  94.4 mL/min (by C-G formula based on SCr of 0.91 mg/dL).  Liver Function Tests: No results for input(s): AST, ALT, ALKPHOS, BILITOT, PROT, ALBUMIN in the last 168 hours.  Urine analysis:    Component Value Date/Time   COLORURINE YELLOW 03/17/2020 2046   APPEARANCEUR CLEAR 03/17/2020 2046   LABSPEC 1.012 03/17/2020 2046   PHURINE 5.0 03/17/2020 2046   GLUCOSEU NEGATIVE 03/17/2020 2046   HGBUR SMALL (A) 03/17/2020 2046   BILIRUBINUR NEGATIVE 03/17/2020 2046   KETONESUR NEGATIVE 03/17/2020 2046   PROTEINUR NEGATIVE 03/17/2020 2046   UROBILINOGEN 0.2 09/12/2013 1642   NITRITE NEGATIVE 03/17/2020 2046   LEUKOCYTESUR NEGATIVE 03/17/2020 2046    Radiological Exams on Admission: CT Head Wo Contrast  Result Date: 03/17/2020 CLINICAL DATA:  Acute pain due to trauma.  Syncope. EXAM: CT HEAD WITHOUT CONTRAST TECHNIQUE: Contiguous axial images were obtained from the base of the skull through the vertex without intravenous contrast. COMPARISON:  September 09, 2019 FINDINGS: Brain: No hemorrhage. No extraaxial collection.No midline shift. The ventricular system is unremarkable.The basal cisterns are unremarkable. Patchy and confluent areas of decreased attenuation are noted throughout the deep and periventricular white matter of the cerebral hemispheres bilaterally, compatible with chronic microvascular ischemic disease. the brainstem is unremarkable. The cerebellum is unremarkable. The sella is unremarkable. Vascular: No hyperdense vessel or unexpected calcification. Skull: There is right posterior scalp swelling without evidence for an underlying calvarial fracture. The skull base is unremarkable. The visualized upper cervical spine is unremarkable. Sinuses/Orbits: The visualized orbits are unremarkable. The paranasal sinuses are unremarkable. The mastoid air cells are clear. Other: The visualized parotid gland is unremarkable. There is no scalp soft tissue swelling. IMPRESSION: 1. No acute intracranial  abnormality. 2. Right posterior scalp swelling without evidence for an underlying calvarial fracture. 3. Chronic microvascular ischemic disease. Electronically Signed   By: Constance Holster M.D.   On: 03/17/2020 18:58   DG Chest Portable 1 View  Result Date: 03/17/2020 CLINICAL DATA:  Syncopal episode EXAM: PORTABLE CHEST 1 VIEW COMPARISON:  06/06/2018 FINDINGS: Borderline cardiomegaly. Low lung volumes. No focal airspace disease, pleural effusion or pneumothorax. IMPRESSION: No active disease. Borderline cardiomegaly. Electronically Signed   By: Donavan Foil M.D.   On: 03/17/2020 19:48   08/03/19 echo  IMPRESSIONS   1. Left ventricular ejection fraction, by visual estimation, is 60 to  65%. The left ventricle has normal function. There is mildly increased  left ventricular hypertrophy.  2. Left ventricular diastolic function could  not be evaluated.  3. The left ventricle has no regional wall motion abnormalities.  4. Global right ventricle has normal systolic function.The right  ventricular size is normal.  5. Left atrial size was normal.  6. Right atrial size was normal.  7. The mitral valve is normal in structure. Trivial mitral valve  regurgitation. No evidence of mitral stenosis.  8. The tricuspid valve is normal in structure. Tricuspid valve  regurgitation is trivial.  9. The aortic valve is tricuspid. Aortic valve regurgitation is not  visualized. Mild aortic valve sclerosis without stenosis.  10. The pulmonic valve was not well visualized. Pulmonic valve  regurgitation is not visualized.  11. The inferior vena cava is normal in size with greater than 50%  respiratory variability, suggesting right atrial pressure of 3 mmHg.  12. Pt in atrial flutter at the time of the study; normal LV systolic  function; mild LVH.   EKG: Independently reviewed.  Vent. rate 56 BPM PR interval * ms QRS duration 83 ms QT/QTc 451/436 ms P-R-T axes -23 8 11  Sinus rhythm Atrial  premature complex Probable anteroseptal infarct, old  Assessment/Plan Principal Problem:   AMS (altered mental status) Likely due to benzodiazepines. Observation/telemetry. Continue close observation. Continue IV fluids. Keep n.p.o. for airway protection. Check LFTs, salicylate and acetaminophen level.  Active Problems:   Type 2 diabetes mellitus (HCC) Currently NPO. Monitor CBG every 6 hours.    Paroxysmal atrial fibrillation (HCC) CHA?DS?-VASc Score of at least 5. Resume Xarelto once more alert. Resume metoprolol 25 mg p.o. twice daily once more alert.    Hypertension Resume amlodipine once more alert. Bradycardic due to beta-blocker. Resume metoprolol once awake. Monitor blood pressure and heart rate.    Bipolar 1 disorder (HCC) Resume Depakote ER once more alert.    Normocytic anemia Monitor H&H.    DVT prophylaxis: On Xarelto. Code Status:   Full code. Family Communication: Disposition Plan:   Patient is from:  Home.  Anticipated DC to:  Home.  Anticipated DC date:  03/18/2020.  Anticipated DC barriers: Clinical status.  Consults called:   Admission status:  Observation/telemetry.   Severity of Illness: High given the patient's decrease in mental alertness likely due to benzodiazepine use.  She will need close monitoring until she is more alert.  Reubin Milan MD [facility]  How to contact the [facility] Attending or Consulting provider [place] or covering provider during after hours [place], for this patient?   1. Check the care team in [facility] and look for a) attending/consulting [facility] provider listed and b) the [facility] team listed 2. Log into [email or web address] and use [place]'s universal password to access. If you do not have the password, please contact the hospital operator. 3. Locate the [facility] provider you are looking for under [facility] and page to a number that you can be directly reached. 4. If you still have difficulty reaching the provider,  please page the [facility] (Director on Call) for the Hospitalists listed on amion for assistance.  03/17/2020, 11:58 PM   This document was prepared using Dragon voice recognition software and may contain some unintended transcription errors.

## 2020-03-17 NOTE — ED Triage Notes (Signed)
Pt brought in by [facility] for c/o syncopal episode; witnesses states pt was walking and then fell to the ground; pt states she take 1/2 a xanax a day; pt drowsy and unable to given any history or information as to why she is here

## 2020-03-17 NOTE — ED Notes (Signed)
Naloxone given at [place]. Pt still very lethargic at this time.

## 2020-03-18 DIAGNOSIS — R400 Somnolence: Secondary | ICD-10-CM

## 2020-03-18 DIAGNOSIS — I10 Essential (primary) hypertension: Secondary | ICD-10-CM | POA: Diagnosis present

## 2020-03-18 LAB — HEPATIC FUNCTION PANEL
ALT: 15 U/L (ref 0–44)
AST: 15 U/L (ref 15–41)
Albumin: 3.6 g/dL (ref 3.5–5.0)
Alkaline Phosphatase: 38 U/L (ref 38–126)
Bilirubin, Direct: <0.1 mg/dL (ref 0.0–0.2)
Total Bilirubin: 0.6 mg/dL (ref 0.3–1.2)
Total Protein: 7.1 g/dL (ref 6.5–8.1)

## 2020-03-18 LAB — ACETAMINOPHEN LEVEL: Acetaminophen (Tylenol), Serum: <10 ug/mL — ABNORMAL LOW (ref 10–30)

## 2020-03-18 LAB — CBG MONITORING, ED
Glucose-Capillary: 122 mg/dL — ABNORMAL HIGH (ref 70–99)
Glucose-Capillary: 212 mg/dL — ABNORMAL HIGH (ref 70–99)

## 2020-03-18 LAB — ETHANOL: Alcohol, Ethyl (B): <10 mg/dL (ref <10)

## 2020-03-18 LAB — TSH: TSH: 0.6 u[IU]/mL (ref 0.350–4.500)

## 2020-03-18 LAB — SALICYLATE LEVEL: Salicylate Lvl: <7 mg/dL — ABNORMAL LOW (ref 7.0–30.0)

## 2020-03-18 NOTE — Discharge Summary (Signed)
Physician Discharge Summary  Rhonda Hickman IOE:[id number] DOB: Jun 05, 1956 DOA: 03/17/2020  PCP: [name], NP  Admit date: 03/17/2020  Discharge date: 03/18/2020  Admitted From:Home  Disposition:  Home  Recommendations for Outpatient Follow-up:  1. Follow up with PCP in 1-2 weeks 2. Please obtain BMP/CBC in one week 3. Continue home medications as prior 4. Discussed the importance of using benzodiazepines only as needed for anxiety and not for sleep  Home Health: None  Equipment/Devices: None  Discharge Condition: Stable  CODE STATUS: Full  Diet recommendation: Heart Healthy/carb modified  Brief/Interim Summary: Per HPI: Rhonda Hickman is a 64 y.o. female with medical history significant of anemia, anxiety, asthma, paroxysmal atrial fibrillation, bipolar 1 disorder, bulging lumbar disc, chronic pain in the left knee, COPD, history of CVA, history of TIA, type 2 diabetes, diabetic peripheral neuropathy who was brought to the emergency department via EMS due to a witnessed syncopal episode, where apparently the patient was walking and then fell to the ground.  During triage, the patient mentioned that she takes half a Xanax a day, but did not specify how much she took today.  She is still somnolent, wakes up briefly, but when asked if she remembers anything leading to her LOC, the patient denies and falls back to sleep again.  ED Course: Initial vital signs were temperature pulse 56 56, respiratory rate 22, BP 139/74 and O2 sat 93% on room air.  The patient was given 2 doses of naloxone 0.4 mg IVP.  -Patient was admitted with acute toxic encephalopathy secondary to mild benzodiazepine overdose, that was not intentional, and was noted to have excessive sedation on admission.  She was initially kept n.p.o. and started on IV fluid.  Her mentation has improved back to her usual baseline at this point and she does not appear to have any significant deficits.  CT head with no acute  findings noted.  TSH within normal limits.  Urine toxicology without any significant findings aside from benzodiazepines.  She has had 2D echocardiogram back in January 2021 with LVEF 60-65% and carotid ultrasound on 01/2020 with mild carotid stenosis.  We will not plan to repeat the studies at this time.  She is stable to continue her usual home medications and does not require any further work-up at this time.  Return to ED if worsening symptoms noted.  No significant findings on telemetry or suggestion of arrhythmia noted.  She is stable for discharge and will follow up with her PCP.  Discharge Diagnoses:  Principal Problem:   AMS (altered mental status) Active Problems:   Type 2 diabetes mellitus (HCC)   Chronic atrial fibrillation (HCC)   Bipolar 1 disorder (HCC)   Normocytic anemia   Hypertension  Principal discharge diagnosis: Acute toxic encephalopathy with associated syncopal episode related to benzodiazepine overdose.  Discharge Instructions  Discharge Instructions    Diet - low sodium heart healthy   Complete by: As directed    Increase activity slowly   Complete by: As directed      Allergies as of 03/18/2020      Reactions   Aspirin Nausea Only, Other (See Comments)   Causes stomach pain      Medication List    TAKE these medications   Advocate Insulin Syringe 29G X 1/2" 0.3 ML Misc Generic drug: Insulin Syringe-Needle [id number] applicators by Does not apply route 2 (two) times daily after a meal.   albuterol 108 (90 Base) MCG/ACT inhaler Commonly known as: VENTOLIN HFA Inhale  2 puffs into the lungs every 6 (six) hours as needed for wheezing or shortness of breath.   amLODipine 10 MG tablet Commonly known as: NORVASC Take 1 tablet (10 mg total) by mouth daily.   budesonide-formoterol 160-4.5 MCG/ACT inhaler Commonly known as: SYMBICORT Inhale 2 puffs into the lungs 2 (two) times daily.   chlorhexidine 0.12 % solution Commonly known as: PERIDEX RINSE MOUTH  WITH 1 CAPFULLX30 SECONDS MORNING AND EVENING AFTER BRUSHING. THEN SPIT, NO SWALLOW   citalopram 40 MG tablet Commonly known as: CELEXA Take 40 mg by mouth daily.   Depakote ER 500 MG 24 hr tablet Generic drug: divalproex Take 500-1,000 mg by mouth See admin instructions. 500MG  in the morning and 1000mg  at bedtime   ferrous sulfate 324 MG Tbec Take 1 tablet (324 mg total) by mouth daily with breakfast.   gabapentin 300 MG capsule Commonly known as: NEURONTIN Take 1 capsule (300 mg total) by mouth 3 (three) times daily.   HumaLOG 100 UNIT/ML cartridge Generic drug: insulin lispro 0-12 units 3 times daily before meals as per sliding scale   Januvia 100 MG tablet Generic drug: sitaGLIPtin TAKE 1 TABLET DAILY   Lantus SoloStar 100 UNIT/ML Solostar Pen Generic drug: insulin glargine Inject 10 Units into the skin daily.   meloxicam 7.5 MG tablet Commonly known as: Mobic Take 1 tablet (7.5 mg total) by mouth daily.   metFORMIN 1000 MG tablet Commonly known as: GLUCOPHAGE Take 1 tablet (1,000 mg total) by mouth 2 (two) times daily with a meal.   metoprolol tartrate 25 MG tablet Commonly known as: LOPRESSOR Take 1 tablet (25 mg total) by mouth 2 (two) times daily.   oxybutynin 5 MG tablet Commonly known as: DITROPAN Take 1 tablet 2 (two) times daily.   Plenvu 140 g Solr Generic drug: PEG-KCl-NaCl-NaSulf-Na Asc-C Take 140 g by mouth as directed. Manufacturer's coupon Universal coupon code:BIN: [id number]; GROUP: [id number]; PCN: [id number]; ID: [id number]; PAY NO MORE $50   rosuvastatin 10 MG tablet Commonly known as: CRESTOR Take 1 tablet (10 mg total) by mouth daily.   traMADol 50 MG tablet Commonly known as: ULTRAM Take 1 tablet (50 mg total) by mouth every 6 (six) hours as needed.   Vitamin D (Ergocalciferol) 1.25 MG (50000 UNIT) Caps capsule Commonly known as: DRISDOL Take 1 capsule (50,000 Units total) by mouth every 7 (seven) days.   Xarelto 20 MG Tabs tablet Generic  drug: rivaroxaban TAKE 1 TABLET WITH SUPPER       Follow-up Information    [name], NP Follow up in 1 week(s).   Specialty: Internal Medicine Contact information: [address] [place] [address] [phone]              Allergies  Allergen Reactions  . Aspirin Nausea Only and Other (See Comments)    Causes stomach pain    Consultations:  None   Procedures/Studies: CT Head Wo Contrast  Result Date: 03/17/2020 CLINICAL DATA:  Acute pain due to trauma.  Syncope. EXAM: CT HEAD WITHOUT CONTRAST TECHNIQUE: Contiguous axial images were obtained from the base of the skull through the vertex without intravenous contrast. COMPARISON:  September 09, 2019 FINDINGS: Brain: No hemorrhage. No extraaxial collection.No midline shift. The ventricular system is unremarkable.The basal cisterns are unremarkable. Patchy and confluent areas of decreased attenuation are noted throughout the deep and periventricular white matter of the cerebral hemispheres bilaterally, compatible with chronic microvascular ischemic disease. the brainstem is unremarkable. The cerebellum is unremarkable. The sella is unremarkable.  Vascular: No hyperdense vessel or unexpected calcification. Skull: There is right posterior scalp swelling without evidence for an underlying calvarial fracture. The skull base is unremarkable. The visualized upper cervical spine is unremarkable. Sinuses/Orbits: The visualized orbits are unremarkable. The paranasal sinuses are unremarkable. The mastoid air cells are clear. Other: The visualized parotid gland is unremarkable. There is no scalp soft tissue swelling. IMPRESSION: 1. No acute intracranial abnormality. 2. Right posterior scalp swelling without evidence for an underlying calvarial fracture. 3. Chronic microvascular ischemic disease. Electronically Signed   By: Constance Holster M.D.   On: 03/17/2020 18:58   DG Chest Portable 1 View  Result Date: 03/17/2020 CLINICAL DATA:   Syncopal episode EXAM: PORTABLE CHEST 1 VIEW COMPARISON:  06/06/2018 FINDINGS: Borderline cardiomegaly. Low lung volumes. No focal airspace disease, pleural effusion or pneumothorax. IMPRESSION: No active disease. Borderline cardiomegaly. Electronically Signed   By: Donavan Foil M.D.   On: 03/17/2020 19:48     Discharge Exam: Vitals:   03/18/20 0730 03/18/20 0732  BP: 113/62   Pulse: (!) 58   Resp: 17   SpO2:  95%   Vitals:   03/18/20 0630 03/18/20 0700 03/18/20 0730 03/18/20 0732  BP:  (!) 107/50 113/62   Pulse: (!) 57  (!) 58   Resp: 18 14 17    SpO2: 100%   95%    General: Pt is alert, awake, not in acute distress Cardiovascular: RRR, S1/S2 +, no rubs, no gallops Respiratory: CTA bilaterally, no wheezing, no rhonchi Abdominal: Soft, NT, ND, bowel sounds + Extremities: no edema, no cyanosis    The results of significant diagnostics from this hospitalization (including imaging, microbiology, ancillary and laboratory) are listed below for reference.     Microbiology: Recent Results (from the past 240 hour(s))  SARS Coronavirus 2 by RT PCR (hospital order, performed in [facility] hospital lab) Nasopharyngeal Nasopharyngeal Swab     Status: None   Collection Time: 03/17/20  9:46 PM   Specimen: Nasopharyngeal Swab  Result Value Ref Range Status   SARS Coronavirus 2 NEGATIVE NEGATIVE Final    Comment: (NOTE) SARS-CoV-2 target nucleic acids are NOT DETECTED.  The SARS-CoV-2 RNA is generally detectable in upper and lower respiratory specimens during the acute phase of infection. The lowest concentration of SARS-CoV-2 viral copies this assay can detect is 250 copies / mL. A negative result does not preclude SARS-CoV-2 infection and should not be used as the sole basis for treatment or other patient management decisions.  A negative result may occur with improper specimen collection / handling, submission of specimen other than nasopharyngeal swab, presence of viral  mutation(s) within the areas targeted by this assay, and inadequate number of viral copies (<250 copies / mL). A negative result must be combined with clinical observations, patient history, and epidemiological information.  Fact Sheet for Patients:   StrictlyIdeas.no  Fact Sheet for Healthcare Providers: BankingDealers.co.za  This test is not yet approved or  cleared by the [place] FDA and has been authorized for detection and/or diagnosis of SARS-CoV-2 by FDA under an Emergency Use Authorization (EUA).  This EUA will remain in effect (meaning this test can be used) for the duration of the COVID-19 declaration under Section 564(b)(1) of the Act, 21 U.S.C. section 360bbb-3(b)(1), unless the authorization is terminated or revoked sooner.  Performed at [facility], [address], [place], [place] [address]      Labs: BNP (last 3 results) No results for input(s): BNP in the last 8760 hours. Basic Metabolic  Panel: Recent Labs  Lab 03/17/20 2000  NA 139  K 4.2  CL 103  CO2 23  GLUCOSE 109*  BUN 15  CREATININE 0.91  CALCIUM 8.8*   Liver Function Tests: Recent Labs  Lab 03/17/20 2340  AST 15  ALT 15  ALKPHOS 38  BILITOT 0.6  PROT 7.1  ALBUMIN 3.6   No results for input(s): LIPASE, AMYLASE in the last 168 hours. No results for input(s): AMMONIA in the last 168 hours. CBC: Recent Labs  Lab 03/17/20 2000  WBC 10.2  NEUTROABS 5.7  HGB 11.5*  HCT 35.9*  MCV 90.2  PLT 206   Cardiac Enzymes: No results for input(s): CKTOTAL, CKMB, CKMBINDEX, TROPONINI in the last 168 hours. BNP: Invalid input(s): POCBNP CBG: Recent Labs  Lab 03/17/20 2039 03/18/20 0001 03/18/20 0622  GLUCAP 106* 122* 212*   D-Dimer No results for input(s): DDIMER in the last 72 hours. Hgb A1c No results for input(s): HGBA1C in the last 72 hours. Lipid Profile No results for input(s): CHOL, HDL, LDLCALC, TRIG, CHOLHDL, LDLDIRECT in  the last 72 hours. Thyroid function studies Recent Labs    03/17/20 2340  TSH 0.600   Anemia work up No results for input(s): VITAMINB12, FOLATE, FERRITIN, TIBC, IRON, RETICCTPCT in the last 72 hours. Urinalysis    Component Value Date/Time   COLORURINE YELLOW 03/17/2020 2046   APPEARANCEUR CLEAR 03/17/2020 2046   LABSPEC 1.012 03/17/2020 2046   PHURINE 5.0 03/17/2020 2046   GLUCOSEU NEGATIVE 03/17/2020 2046   HGBUR SMALL (A) 03/17/2020 2046   BILIRUBINUR NEGATIVE 03/17/2020 2046   KETONESUR NEGATIVE 03/17/2020 2046   PROTEINUR NEGATIVE 03/17/2020 2046   UROBILINOGEN 0.2 09/12/2013 1642   NITRITE NEGATIVE 03/17/2020 2046   LEUKOCYTESUR NEGATIVE 03/17/2020 2046   Sepsis Labs Invalid input(s): PROCALCITONIN,  WBC,  LACTICIDVEN Microbiology Recent Results (from the past 240 hour(s))  SARS Coronavirus 2 by RT PCR (hospital order, performed in [place] hospital lab) Nasopharyngeal Nasopharyngeal Swab     Status: None   Collection Time: 03/17/20  9:46 PM   Specimen: Nasopharyngeal Swab  Result Value Ref Range Status   SARS Coronavirus 2 NEGATIVE NEGATIVE Final    Comment: (NOTE) SARS-CoV-2 target nucleic acids are NOT DETECTED.  The SARS-CoV-2 RNA is generally detectable in upper and lower respiratory specimens during the acute phase of infection. The lowest concentration of SARS-CoV-2 viral copies this assay can detect is 250 copies / mL. A negative result does not preclude SARS-CoV-2 infection and should not be used as the sole basis for treatment or other patient management decisions.  A negative result may occur with improper specimen collection / handling, submission of specimen other than nasopharyngeal swab, presence of viral mutation(s) within the areas targeted by this assay, and inadequate number of viral copies (<250 copies / mL). A negative result must be combined with clinical observations, patient history, and epidemiological information.  Fact Sheet for  Patients:   StrictlyIdeas.no  Fact Sheet for Healthcare Providers: BankingDealers.co.za  This test is not yet approved or  cleared by the [place] FDA and has been authorized for detection and/or diagnosis of SARS-CoV-2 by FDA under an Emergency Use Authorization (EUA).  This EUA will remain in effect (meaning this test can be used) for the duration of the COVID-19 declaration under Section 564(b)(1) of the Act, 21 U.S.C. section 360bbb-3(b)(1), unless the authorization is terminated or revoked sooner.  Performed at [facility], [address], [place], [place] [address]  Time coordinating discharge: 35 minutes  SIGNED:   Rodena Goldmann, DO [facility] 03/18/2020, 8:07 AM  If 7PM-7AM, please contact night-coverage [email or web address]

## 2020-03-18 NOTE — ED Notes (Signed)
Pt given crackers, peanut butter and drink per hospitalist. Belongings removed from locker and returned to patient.

## 2020-03-18 NOTE — ED Notes (Signed)
Pt more alert and asking questions regarding her hospital visit. Hospitalist made aware of improvement in patient alertness.

## 2020-03-23 ENCOUNTER — Other Ambulatory Visit (INDEPENDENT_AMBULATORY_CARE_PROVIDER_SITE_OTHER): Payer: Self-pay | Admitting: Primary Care

## 2020-03-23 NOTE — Telephone Encounter (Signed)
Sent to PCP ?

## 2020-03-25 ENCOUNTER — Other Ambulatory Visit (INDEPENDENT_AMBULATORY_CARE_PROVIDER_SITE_OTHER): Payer: Self-pay | Admitting: Primary Care

## 2020-03-25 DIAGNOSIS — F329 Major depressive disorder, single episode, unspecified: Secondary | ICD-10-CM | POA: Diagnosis not present

## 2020-03-25 DIAGNOSIS — Z7689 Persons encountering health services in other specified circumstances: Secondary | ICD-10-CM | POA: Diagnosis not present

## 2020-03-25 MED ORDER — RIVAROXABAN 20 MG PO TABS: ORAL_TABLET | ORAL | 0 refills | Status: DC

## 2020-03-25 NOTE — Telephone Encounter (Signed)
Patient checking on the status of XARELTO 20 MG TABS tablet request, patient states pharmacy faxed over serveral request and would like a follow up call today when completed.    [place], [place] Phone:  [phone]  Fax:  [phone]

## 2020-03-26 ENCOUNTER — Encounter (HOSPITAL_COMMUNITY): Payer: Self-pay

## 2020-03-26 NOTE — Therapy (Signed)
[place], [place], [address] Phone: [phone]   Fax:  [phone]  Patient Details  Name: Rhonda Hickman MRN: [id number] Date of Birth: 1956-06-07 Referring Provider:  No ref. provider found  Encounter Date: 03/26/2020  OCCUPATIONAL THERAPY DISCHARGE SUMMARY  Visits from Start of Care: 2  Current functional level related to goals / functional outcomes: Pt discharged from OT services due to lack of transportation and failure to return to clinic.    OT SHORT TERM GOAL #1   Title  Patient will be educated and independent with HEP in order to faciliate her progress in therapy and allow her to return to using her left hand as her nondominant extremity for 50% of her daily and work tasks.    Time  6    Period  Weeks    Status  On-going    Target Date  11/12/19        OT SHORT TERM GOAL #2   Title  Patient will increase her A/ROM of her left hand and wrist to [facility] in order to form a complete fist while holding onto small-medium size objects such as her cell phone on a daily basis.    Time  6    Period  Weeks    Status  On-going        OT SHORT TERM GOAL #3   Title  Patient will increase her left hand coordination by completing the 9 hole peg test in 35 seconds or less.    Time  5    Period  Weeks    Status  Achieved        OT SHORT TERM GOAL #4   Title  Patient will increase her left grip strength by 10# more (35#) and her 3 point pinch strength 2 more lbs. (10#) in order to grasp and hold onto normal household items without droppping.    Baseline  3/24: Patient has met her goal for pinch and grip strength. Goal is upgraded to work on achieving average norms for grip and 3 point pinch.    Time  5    Period  Weeks    Status  Revised        OT SHORT TERM GOAL #5   Title  Pt will report a decrease in pain of approximately 5/10 or less when completing daily and work related tasks.     Time  5    Period  Weeks    Status  On-going        Additional Short Term Goals   Additional Short Term Goals  Yes        OT SHORT TERM GOAL #6   Title  Patient will be educated and voice understanding of techniques and/or strategies to complete which may assist with decreasing edema and increase sensation in left hand which will aid with overall functional use during daily tasks.    Time  6    Period  Weeks    Status  New    Target Date  11/12/19         Remaining deficits: Edema, hypersensitivity, pain, grip and pinch strength, A/ROM    Education / Equipment: HEP Plan: Patient agrees to discharge.  Patient goals were partially met. Patient is being discharged due to not returning since the last visit.  ?????        Ailene Ravel, OTR/L,CBIS  [phone]  03/26/2020, 2:43 PM  [place], [place], [address] Phone: [phone]   Fax:  [phone]

## 2020-03-27 NOTE — Progress Notes (Deleted)
Cardiology Office Note:    Date:  03/27/2020   ID:  Rhonda Hickman, DOB [birth year], MRN [id number]  PCP:  [name], NP  Cardiologist:  No primary care provider on file.  Electrophysiologist:  None   Referring MD: [name], NP   No chief complaint on file.   History of Present Illness:    Rhonda Hickman is a 64 y.o. female with a hx of TIA, atrial fibrillation, BPD, COPD, T2DM, asthma who presents for follow-up.  She was referred by [name], NP for evaluation of atrial fibrillation, initially seen on 12/16/2019.  She reports that she was in the hospital for 5 days last month in [place].  States that she was admitted with AKI and UTI and found to have atrial fibrillation.  Started on Xarelto.  States that she has been taking this without any bleeding issues.  Reports she has had intermittent palpitations over the last month since her discharge from the hospital.  She reports a history of stabbing chest pain in the center of her chest but has not had any recently, does however chest pressure with exertion.  States that going for walks causes her to have exertional chest pressure.  She is smoking about half a pack per day, down from peak of 2 packs/day.  States that both her mother and father had MIs but unsure of age.  TTE on 08/03/2019 showed LVEF 60-65%, normal RV function, normal biatrial size, no significant valvular disease.  Lexiscan Myoview on 01/03/2020 showed low risk stress test with breast attenuation but no ischemia, LVEF 48%.  Carotid duplex on 01/14/2020 showed bilateral 1 to 39% stenosis.  Zio patch x14 days on 01/29/2020 showed 1% atrial flutter burden, average heart rate 86 bpm.  Since last clinic visit,    Past Medical History:  Diagnosis Date  . Anemia   . Anxiety   . Asthma   . Atrial fibrillation ([place])   . Bipolar 1 disorder ([place])   . Bulging lumbar disc   . Chronic pain of left knee   . COPD (chronic obstructive pulmonary  disease) ([place])   . CVA (cerebral vascular accident) ([place])   . Diabetes mellitus   . Neuropathy   . TIA (transient ischemic attack)     Past Surgical History:  Procedure Laterality Date  . ABDOMINAL HYSTERECTOMY    . CARPAL TUNNEL RELEASE Right   . CHOLECYSTECTOMY    . KNEE ARTHROSCOPY WITH MEDIAL MENISECTOMY Right 04/14/2016   Procedure: KNEE ARTHROSCOPY WITH MEDIAL AND LATERAL MENISECTOMY, MICROFRACTURE REPAIR;  Surgeon: Carole Civil, MD;  Location: [facility];  Service: Orthopedics;  Laterality: Right;  lateral menisectomy - needs crutch training    Current Medications: No outpatient medications have been marked as taking for the 03/30/20 encounter (Appointment) with [name], MD.     Allergies:   Aspirin   Social History   Socioeconomic History  . Marital status: Single    Spouse name: Not on file  . Number of children: 0  . Years of education: 19  . Highest education level: High school graduate  Occupational History  . Occupation: Unemployed  Tobacco Use  . Smoking status: Current Some Day Smoker    Packs/day: 0.50    Types: Cigarettes  . Smokeless tobacco: Never Used  . Tobacco comment: tobacco info given  Vaping Use  . Vaping Use: Some days  Substance and Sexual Activity  . Alcohol use: No  . Drug use: No  .  Sexual activity: Never  Other Topics Concern  . Not on file  Social History Narrative   Lives at home alone.   Right-handed.   No caffeine use.   Social Determinants of Health   Financial Resource Strain:   . Difficulty of Paying Living Expenses: Not on file  Food Insecurity:   . Worried About Charity fundraiser in the Last Year: Not on file  . Ran Out of Food in the Last Year: Not on file  Transportation Needs:   . Lack of Transportation (Medical): Not on file  . Lack of Transportation (Non-Medical): Not on file  Physical Activity:   . Days of Exercise per Week: Not on file  . Minutes of Exercise per Session: Not on file    Stress:   . Feeling of Stress : Not on file  Social Connections:   . Frequency of Communication with Friends and Family: Not on file  . Frequency of Social Gatherings with Friends and Family: Not on file  . Attends Religious Services: Not on file  . Active Member of Clubs or Organizations: Not on file  . Attends Archivist Meetings: Not on file  . Marital Status: Not on file     Family History: The patient's family history includes Diabetes in her mother; Heart attack in her father.  ROS:   Please see the history of present illness.     All other systems reviewed and are negative.  EKGs/Labs/Other Studies Reviewed:    The following studies were reviewed today:   EKG:  EKG is ordered today.  The ekg ordered today demonstrates normal sinus rhythm, rate 72, no ST/T abnormalities  Recent Labs: 12/19/2019: Magnesium 1.6 03/17/2020: ALT 15; BUN 15; Creatinine, Ser 0.91; Hemoglobin 11.5; Platelets 206; Potassium 4.2; Sodium 139; TSH 0.600  Recent Lipid Panel    Component Value Date/Time   CHOL 146 08/04/2019 0548   TRIG 161 (H) 08/04/2019 0548   HDL 13 (L) 08/04/2019 0548   CHOLHDL 11.2 08/04/2019 0548   VLDL 32 08/04/2019 0548   LDLCALC 101 (H) 08/04/2019 0548    Physical Exam:    VS:  There were no vitals taken for this visit.    Wt Readings from Last 3 Encounters:  03/09/20 278 lb 12.8 oz (126.5 kg)  03/03/20 273 lb (123.8 kg)  02/12/20 279 lb (126.6 kg)     GEN: in no acute distress HEENT: Normal NECK: No JVD LYMPHATICS: No lymphadenopathy CARDIAC: RRR, no murmurs, rubs, gallops RESPIRATORY:  Clear to auscultation without rales, wheezing or rhonchi  ABDOMEN: Soft, non-tender, non-distended MUSCULOSKELETAL:  No edema SKIN: Warm and dry NEUROLOGIC:  Alert and oriented x 3 PSYCHIATRIC:  Normal affect   ASSESSMENT:    No diagnosis found. PLAN:     Chest pain: description suggests typical angina and has significant risk factors (type 2 diabetes,  hypertension, tobacco use).  TTE on 08/03/2019 showed LVEF 60-65%, normal RV function, normal biatrial size, no significant valvular disease.  Lexiscan Myoview on 01/03/2020 showed low risk stress test with breast attenuation but no ischemia, LVEF 48%.    Atrial fibrillation: CHA2DS2-VASc score 5 (hypertension, diabetes, CVA, female).  Zio patch x14 days on 01/29/2020 showed 1% atrial flutter burden, average heart rate 86 bpm. -Continue Xarelto -Sleep study -Continue metoprolol 25 mg twice daily  Hypertension: On amlodipine 10 mg daily, lisinopril 20 mg daily, metoprolol 25 mg twice daily.    T2DM: On insulin.  A1c 8.9  Hyperlipidemia: Given history of diabetes  and possible CVA, should be on statin, started rosuvastatin 10 mg daily at last clinic visit.  Will check lipid panel.  Tobacco use: Patient counseled on the risks of tobacco use and cessation strongly encouraged  TIA: Follows with neurology.  Carotid duplex on 01/14/2020 showed bilateral 1 to 39% stenosis.  Zio patch x14 days on 01/29/2020 showed 1% atrial flutter burden, average heart rate 86 bpm.  Snoring: Ordered sleep study to evaluate for OSA  RTC in 3 months  Medication Adjustments/Labs and Tests Ordered: Current medicines are reviewed at length with the patient today.  Concerns regarding medicines are outlined above.  No orders of the defined types were placed in this encounter.  No orders of the defined types were placed in this encounter.   There are no Patient Instructions on file for this visit.   Signed, [name], MD  03/27/2020 11:27 AM    [place]

## 2020-03-30 ENCOUNTER — Ambulatory Visit: Payer: Medicaid Other | Admitting: Cardiology

## 2020-03-30 DIAGNOSIS — Z7689 Persons encountering health services in other specified circumstances: Secondary | ICD-10-CM | POA: Diagnosis not present

## 2020-03-30 DIAGNOSIS — E1142 Type 2 diabetes mellitus with diabetic polyneuropathy: Secondary | ICD-10-CM | POA: Diagnosis not present

## 2020-03-31 ENCOUNTER — Ambulatory Visit (INDEPENDENT_AMBULATORY_CARE_PROVIDER_SITE_OTHER): Payer: Medicaid Other | Admitting: Primary Care

## 2020-03-31 DIAGNOSIS — E1142 Type 2 diabetes mellitus with diabetic polyneuropathy: Secondary | ICD-10-CM | POA: Diagnosis not present

## 2020-03-31 DIAGNOSIS — Z7689 Persons encountering health services in other specified circumstances: Secondary | ICD-10-CM | POA: Diagnosis not present

## 2020-04-01 DIAGNOSIS — M5416 Radiculopathy, lumbar region: Secondary | ICD-10-CM | POA: Diagnosis not present

## 2020-04-01 DIAGNOSIS — Z79899 Other long term (current) drug therapy: Secondary | ICD-10-CM | POA: Diagnosis not present

## 2020-04-01 DIAGNOSIS — E119 Type 2 diabetes mellitus without complications: Secondary | ICD-10-CM | POA: Diagnosis not present

## 2020-04-01 DIAGNOSIS — E1142 Type 2 diabetes mellitus with diabetic polyneuropathy: Secondary | ICD-10-CM | POA: Diagnosis not present

## 2020-04-01 DIAGNOSIS — G8929 Other chronic pain: Secondary | ICD-10-CM | POA: Diagnosis not present

## 2020-04-01 DIAGNOSIS — Z7689 Persons encountering health services in other specified circumstances: Secondary | ICD-10-CM | POA: Diagnosis not present

## 2020-04-02 DIAGNOSIS — E1142 Type 2 diabetes mellitus with diabetic polyneuropathy: Secondary | ICD-10-CM | POA: Diagnosis not present

## 2020-04-03 DIAGNOSIS — E1142 Type 2 diabetes mellitus with diabetic polyneuropathy: Secondary | ICD-10-CM | POA: Diagnosis not present

## 2020-04-03 NOTE — Telephone Encounter (Signed)
Thank you for the update!

## 2020-04-03 NOTE — Telephone Encounter (Signed)
Patient cancelled her appointment with cardiology for 03-30-20. Patient was reached by phone, she said she was sick and did cancel her appointment. Advised her to call and get an appointment ASAP for the clearance or we would have to reschedule her colonoscopy. She states she will call to get back on the schedule.

## 2020-04-04 DIAGNOSIS — E1142 Type 2 diabetes mellitus with diabetic polyneuropathy: Secondary | ICD-10-CM | POA: Diagnosis not present

## 2020-04-05 DIAGNOSIS — E1142 Type 2 diabetes mellitus with diabetic polyneuropathy: Secondary | ICD-10-CM | POA: Diagnosis not present

## 2020-04-06 ENCOUNTER — Ambulatory Visit (INDEPENDENT_AMBULATORY_CARE_PROVIDER_SITE_OTHER): Payer: Medicaid Other | Admitting: Primary Care

## 2020-04-06 ENCOUNTER — Other Ambulatory Visit: Payer: Self-pay

## 2020-04-06 ENCOUNTER — Encounter (INDEPENDENT_AMBULATORY_CARE_PROVIDER_SITE_OTHER): Payer: Self-pay | Admitting: Primary Care

## 2020-04-06 VITALS — BP 112/68 | HR 66 | Temp 97.3°F | Ht 72.0 in | Wt 280.2 lb

## 2020-04-06 DIAGNOSIS — E1142 Type 2 diabetes mellitus with diabetic polyneuropathy: Secondary | ICD-10-CM | POA: Diagnosis not present

## 2020-04-06 DIAGNOSIS — Z7689 Persons encountering health services in other specified circumstances: Secondary | ICD-10-CM | POA: Diagnosis not present

## 2020-04-06 DIAGNOSIS — G44209 Tension-type headache, unspecified, not intractable: Secondary | ICD-10-CM

## 2020-04-06 DIAGNOSIS — E1100 Type 2 diabetes mellitus with hyperosmolarity without nonketotic hyperglycemic-hyperosmolar coma (NKHHC): Secondary | ICD-10-CM | POA: Diagnosis not present

## 2020-04-06 DIAGNOSIS — Z09 Encounter for follow-up examination after completed treatment for conditions other than malignant neoplasm: Secondary | ICD-10-CM | POA: Diagnosis not present

## 2020-04-06 DIAGNOSIS — E119 Type 2 diabetes mellitus without complications: Secondary | ICD-10-CM | POA: Diagnosis not present

## 2020-04-06 DIAGNOSIS — W19XXXD Unspecified fall, subsequent encounter: Secondary | ICD-10-CM

## 2020-04-06 DIAGNOSIS — Z794 Long term (current) use of insulin: Secondary | ICD-10-CM | POA: Diagnosis not present

## 2020-04-06 NOTE — Patient Instructions (Signed)

## 2020-04-06 NOTE — Progress Notes (Signed)
Establish Patient   Subjective:    Patient ID: Rhonda Hickman, female    DOB: 12-29-1955, 64 y.o.   MRN: [id number]  Chief Complaint  Patient presents with   Hospitalization Follow-up    syncope     HPI Rhonda Hickman is a 64 y.o.female presents for follow up from the hospital. Admit date to the hospital was 03/17/20, patient was discharged from the hospital on 03/18/20, patient was admitted for:  ALTERED LEVEL OF CONSCIOUSNESS. LOC. Per EMS witnesses called after seeing pt walking and then falling to the ground.  Past Medical History:  Diagnosis Date   Anemia    Anxiety    Asthma    Atrial fibrillation ([facility])    Bipolar 1 disorder ([facility])    Bulging lumbar disc    Chronic pain of left knee    COPD (chronic obstructive pulmonary disease) ([facility])    CVA (cerebral vascular accident) ([place])    Diabetes mellitus    Neuropathy    TIA (transient ischemic attack)    Current Outpatient Medications on File Prior to Visit  Medication Sig Dispense Refill   albuterol (VENTOLIN HFA) 108 (90 Base) MCG/ACT inhaler Inhale 2 puffs into the lungs every 6 (six) hours as needed for wheezing or shortness of breath. 8 g 1   amLODipine (NORVASC) 10 MG tablet Take 1 tablet (10 mg total) by mouth daily. 90 tablet 3   budesonide-formoterol (SYMBICORT) 160-4.5 MCG/ACT inhaler Inhale 2 puffs into the lungs 2 (two) times daily. 1 Inhaler 3   chlorhexidine (PERIDEX) 0.12 % solution RINSE MOUTH WITH 1 CAPFULLX30 SECONDS MORNING AND EVENING AFTER BRUSHING. THEN SPIT, NO SWALLOW     citalopram (CELEXA) 40 MG tablet Take 40 mg by mouth daily.     divalproex (DEPAKOTE ER) 500 MG 24 hr tablet Take 500-1,000 mg by mouth See admin instructions. 500MG  in the morning and 1000mg  at bedtime     ferrous sulfate 324 MG TBEC Take 1 tablet (324 mg total) by mouth daily with breakfast. 90 tablet 1   gabapentin (NEURONTIN) 300 MG capsule Take 1 capsule (300 mg total) by mouth 3 (three) times daily. 270  capsule 11   insulin glargine (LANTUS SOLOSTAR) 100 UNIT/ML Solostar Pen Inject 10 Units into the skin daily. 15 mL 3   insulin lispro (HUMALOG) 100 UNIT/ML cartridge 0-12 units 3 times daily before meals as per sliding scale 15 mL 3   Insulin Syringe-Needle U-100 (ADVOCATE INSULIN SYRINGE) 29G X 1/2" 0.3 ML MISC 242 applicators by Does not apply route 2 (two) times daily after a meal. 200 each 6   JANUVIA 100 MG tablet TAKE 1 TABLET DAILY 30 tablet 2   metFORMIN (GLUCOPHAGE) 1000 MG tablet Take 1 tablet (1,000 mg total) by mouth 2 (two) times daily with a meal. 180 tablet 3   oxybutynin (DITROPAN) 5 MG tablet Take 1 tablet 2 (two) times daily. 60 tablet 3   PEG-KCl-NaCl-NaSulf-Na Asc-C (PLENVU) 140 g SOLR Take 140 g by mouth as directed. Manufacturer's coupon Universal coupon code:BIN: P2366821; GROUP: [id number]; PCN: [id number]; ID: [id number]; PAY NO MORE $50 1 each 0   Vitamin D, Ergocalciferol, (DRISDOL) 1.25 MG (50000 UNIT) CAPS capsule Take 1 capsule (50,000 Units total) by mouth every 7 (seven) days. 32 capsule 0   XARELTO 20 MG TABS tablet TAKE 1 TABLET WITH SUPPER 30 tablet 0   HYDROcodone-acetaminophen (NORCO) 7.5-325 MG tablet Take 1 tablet by mouth 4 (four) times daily as needed.     metoprolol tartrate (  LOPRESSOR) 25 MG tablet Take 1 tablet (25 mg total) by mouth 2 (two) times daily. 180 tablet 3   rosuvastatin (CRESTOR) 10 MG tablet Take 1 tablet (10 mg total) by mouth daily. 90 tablet 3   No current facility-administered medications on file prior to visit.   Assessment and Plan: Rhonda Hickman was seen today for hospitalization follow-up.  Diagnoses and all orders for this visit:  Fall, subsequent encounter Patient had worked 24 hrs 8 days shift unable to rest she had a 1/2 a Xanax received from a friend. She hit her head on the concrete. She also vapes. UDS done and benzo was positive. She will not take anyone else medication because you do not know what reaction you will  have.  Hospital discharge follow-up CT scan without any bleed or skull fractures.  Patient does have some swelling to the right posterior scalp consistent with a head injury. Only recommendations f/u with PCP  Type 2 diabetes mellitus with 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, with long-term current use of insulin (HCC) A1C improving now 8.3 4 weeks prior 8.9 not at goal Goal of therapy: Less than 6.5 hemoglobin A1c.  Reference clinical practice recommendations. Foods that are high in carbohydrates are the following rice, potatoes, breads, sugars, and pastas.  Reduction in the intake (eating) will assist in lowering your blood sugars.  Review of Systems  Musculoskeletal: Positive for gait problem.  Neurological: Positive for dizziness, speech difficulty and headaches.  All other systems reviewed and are negative.      Objective:    Physical Exam Neurological:     Comments: Difficulty with words aphasia      BP 112/68 (BP Location: Left Arm, Patient Position: Sitting, Cuff Size: Large)    Pulse 66    Temp (!) 97.3 F (36.3 C) (Temporal)    Ht 6' (1.829 m)    Wt 280 lb 3.2 oz (127.1 kg)    SpO2 96%    BMI 38.00 kg/m  Wt Readings from Last 3 Encounters:  04/06/20 280 lb 3.2 oz (127.1 kg)  03/09/20 278 lb 12.8 oz (126.5 kg)  03/03/20 273 lb (123.8 kg)  General Appearance: Well nourished,obese femal  in no apparent distress. Eyes: PERRLA, EOMs, conjunctiva no swelling or erythema Sinuses: No Frontal/maxillary tenderness ENT/Mouth: Ext aud canals clear, TMs without erythema, bulging. No erythema, swelling, or exudate on post pharynx.  Tonsils not swollen or erythematous. Hearing normal.  Neck: Supple, thyroid normal.  Respiratory: Respiratory effort normal, BS equal bilaterally without rales, rhonchi, wheezing or stridor.  Cardio: RRR with no MRGs. Brisk peripheral pulses without edema.  Abdomen: Soft, + BS.  Non tender, no guarding, rebound, hernias, masses. Lymphatics: Non tender without  lymphadenopathy.  Musculoskeletal: Full ROM, 5/5 strength, normal gait.  Skin: Warm, dry without rashes, lesions, ecchymosis.  Neuro: Cranial nerves intact. Normal muscle tone, no cerebellar symptoms. Sensation intact.  Comments: Difficulty with words aphasia   Psych: Awake and oriented X 3, normal affect, Insight and Judgment appropriate.    Health Maintenance Due  Topic Date Due   Hepatitis C Screening  Never done   PNEUMOCOCCAL POLYSACCHARIDE VACCINE AGE 67-64 HIGH RISK  Never done   FOOT EXAM  Never done   OPHTHALMOLOGY EXAM  Never done   URINE MICROALBUMIN  Never done   COVID-19 Vaccine (1) Never done   PAP SMEAR-Modifier  Never done   MAMMOGRAM  Never done   COLONOSCOPY  Never done    There are no preventive care reminders to display  for this patient.   Lab Results  Component Value Date   TSH 0.600 03/17/2020   Lab Results  Component Value Date   WBC 10.2 03/17/2020   HGB 11.5 (L) 03/17/2020   HCT 35.9 (L) 03/17/2020   MCV 90.2 03/17/2020   PLT 206 03/17/2020   Lab Results  Component Value Date   NA 139 03/17/2020   K 4.2 03/17/2020   CO2 23 03/17/2020   GLUCOSE 109 (H) 03/17/2020   BUN 15 03/17/2020   CREATININE 0.91 03/17/2020   BILITOT 0.6 03/17/2020   ALKPHOS 38 03/17/2020   AST 15 03/17/2020   ALT 15 03/17/2020   PROT 7.1 03/17/2020   ALBUMIN 3.6 03/17/2020   CALCIUM 8.8 (L) 03/17/2020   ANIONGAP 13 03/17/2020   Lab Results  Component Value Date   CHOL 146 08/04/2019   Lab Results  Component Value Date   HDL 13 (L) 08/04/2019   Lab Results  Component Value Date   LDLCALC 101 (H) 08/04/2019   Lab Results  Component Value Date   TRIG 161 (H) 08/04/2019   Lab Results  Component Value Date   CHOLHDL 11.2 08/04/2019   Lab Results  Component Value Date   HGBA1C 8.3 (A) 03/09/2020      [name], NP

## 2020-04-06 NOTE — Progress Notes (Signed)
Pt states that since her fall she has constant dizziness and she has constant headaches

## 2020-04-07 DIAGNOSIS — E1142 Type 2 diabetes mellitus with diabetic polyneuropathy: Secondary | ICD-10-CM | POA: Diagnosis not present

## 2020-04-08 ENCOUNTER — Other Ambulatory Visit (INDEPENDENT_AMBULATORY_CARE_PROVIDER_SITE_OTHER): Payer: Self-pay | Admitting: Primary Care

## 2020-04-08 DIAGNOSIS — E1142 Type 2 diabetes mellitus with diabetic polyneuropathy: Secondary | ICD-10-CM | POA: Diagnosis not present

## 2020-04-08 NOTE — Telephone Encounter (Signed)
Sent to PCP to refill if appropriate.  

## 2020-04-09 DIAGNOSIS — E1142 Type 2 diabetes mellitus with diabetic polyneuropathy: Secondary | ICD-10-CM | POA: Diagnosis not present

## 2020-04-10 DIAGNOSIS — E1142 Type 2 diabetes mellitus with diabetic polyneuropathy: Secondary | ICD-10-CM | POA: Diagnosis not present

## 2020-04-10 NOTE — Telephone Encounter (Signed)
Left message to call back, need to move her procedure date. She is scheduled for 08-01-2019. She was to follow up with cardiology for clearance on 03-30-2020, she moved that appointment to 04-28-2020. Which will not allow for enough time to hold her Xarelto.

## 2020-04-11 DIAGNOSIS — E1142 Type 2 diabetes mellitus with diabetic polyneuropathy: Secondary | ICD-10-CM | POA: Diagnosis not present

## 2020-04-12 DIAGNOSIS — E1142 Type 2 diabetes mellitus with diabetic polyneuropathy: Secondary | ICD-10-CM | POA: Diagnosis not present

## 2020-04-13 DIAGNOSIS — E1142 Type 2 diabetes mellitus with diabetic polyneuropathy: Secondary | ICD-10-CM | POA: Diagnosis not present

## 2020-04-14 DIAGNOSIS — E1142 Type 2 diabetes mellitus with diabetic polyneuropathy: Secondary | ICD-10-CM | POA: Diagnosis not present

## 2020-04-15 DIAGNOSIS — E1142 Type 2 diabetes mellitus with diabetic polyneuropathy: Secondary | ICD-10-CM | POA: Diagnosis not present

## 2020-04-16 DIAGNOSIS — E1142 Type 2 diabetes mellitus with diabetic polyneuropathy: Secondary | ICD-10-CM | POA: Diagnosis not present

## 2020-04-17 ENCOUNTER — Telehealth: Payer: Self-pay | Admitting: Primary Care

## 2020-04-17 DIAGNOSIS — E1142 Type 2 diabetes mellitus with diabetic polyneuropathy: Secondary | ICD-10-CM | POA: Diagnosis not present

## 2020-04-17 NOTE — Telephone Encounter (Signed)
Copied from [place] [phone] [id number]. Topic: General - Inquiry >> Apr 17, 2020  3:00 PM Gillis Ends D wrote: Reason for CRM: Rollene Fare called from [facility] for [place] and she said she spoke with one of the providers and he stated that she didn't need to be seen by him but her PCP. If you have any further questions she can be reached at [phone]. Please advise

## 2020-04-18 DIAGNOSIS — E1142 Type 2 diabetes mellitus with diabetic polyneuropathy: Secondary | ICD-10-CM | POA: Diagnosis not present

## 2020-04-19 DIAGNOSIS — E1142 Type 2 diabetes mellitus with diabetic polyneuropathy: Secondary | ICD-10-CM | POA: Diagnosis not present

## 2020-04-20 DIAGNOSIS — E1142 Type 2 diabetes mellitus with diabetic polyneuropathy: Secondary | ICD-10-CM | POA: Diagnosis not present

## 2020-04-20 NOTE — Telephone Encounter (Signed)
Sent to PCP ?

## 2020-04-21 ENCOUNTER — Other Ambulatory Visit: Payer: Self-pay | Admitting: Orthopedic Surgery

## 2020-04-21 DIAGNOSIS — E1142 Type 2 diabetes mellitus with diabetic polyneuropathy: Secondary | ICD-10-CM | POA: Diagnosis not present

## 2020-04-21 DIAGNOSIS — M1712 Unilateral primary osteoarthritis, left knee: Secondary | ICD-10-CM

## 2020-04-21 NOTE — Telephone Encounter (Signed)
Rx refilll request

## 2020-04-21 NOTE — Telephone Encounter (Signed)
Noted Neurology f/u with [facility] will discuss with patient on f/u

## 2020-04-22 DIAGNOSIS — E1142 Type 2 diabetes mellitus with diabetic polyneuropathy: Secondary | ICD-10-CM | POA: Diagnosis not present

## 2020-04-23 DIAGNOSIS — E1142 Type 2 diabetes mellitus with diabetic polyneuropathy: Secondary | ICD-10-CM | POA: Diagnosis not present

## 2020-04-24 DIAGNOSIS — F329 Major depressive disorder, single episode, unspecified: Secondary | ICD-10-CM | POA: Diagnosis not present

## 2020-04-24 DIAGNOSIS — E1142 Type 2 diabetes mellitus with diabetic polyneuropathy: Secondary | ICD-10-CM | POA: Diagnosis not present

## 2020-04-25 DIAGNOSIS — E1142 Type 2 diabetes mellitus with diabetic polyneuropathy: Secondary | ICD-10-CM | POA: Diagnosis not present

## 2020-04-26 DIAGNOSIS — E1142 Type 2 diabetes mellitus with diabetic polyneuropathy: Secondary | ICD-10-CM | POA: Diagnosis not present

## 2020-04-26 NOTE — Progress Notes (Signed)
Cardiology Office Note:    Date:  04/28/2020   ID:  [name], DOB Feb 23, 1956, MRN [id number]  PCP:  Rhonda Perna, NP  Cardiologist:  No primary care provider on file.  Electrophysiologist:  None   Referring MD: Rhonda Perna, NP   Chief Complaint  Patient presents with  . Dizziness    History of Present Illness:    Rhonda Hickman is a 64 y.o. female with a hx of TIA, atrial fibrillation, BPD, COPD, T2DM, asthma who presents for follow-up.  She was referred by Rhonda Mire, NP for evaluation of atrial fibrillation, initially seen on 12/16/2019.  She reports that she was in the hospital for 5 days in [place] in May 2021.  States that she was admitted with AKI and UTI and found to have atrial fibrillation.  Started on Xarelto.  States that she has been taking this without any bleeding issues.  Reports she has had intermittent palpitations over the last month since her discharge from the hospital.  She reports a history of stabbing chest pain in the center of her chest but has not had any recently, does however chest pressure with exertion.  States that going for walks causes her to have exertional chest pressure.  She is smoking about half a pack per day, down from peak of 2 packs/day.  States that both her mother and father had MIs but unsure of age.  TTE on 08/03/2019 showed LVEF 60-65%, normal RV function, normal biatrial size, no significant valvular disease.  Lexiscan Myoview on 01/03/2020 showed low risk stress test with breast attenuation but no ischemia, LVEF 48%.  Carotid duplex on 01/14/2020 showed bilateral 1 to 39% stenosis.  Zio patch x14 days on 01/29/2020 showed 1% atrial flutter burden, average heart rate 86 bpm.  Since last clinic visit, she was admitted to [facility] from 9/7 through 03/18/2020 after a witnessed syncopal episode.  She was found to have acute toxic encephalopathy secondary to mild benzodiazepine overdose, that was not intentional.  Head  CT unremarkable.  Urine toxicology unremarkable aside from benzodiazepines.  She was monitored and had improvement in her mentation. She reports that since that time she has had persistent dizzy spells. Occurs multiple times a day. She does not know what causes it. Does state that changing positions can seem to cause it but can also occur just with lying down. Feels like room is spinning. Reports chest pain has improved. Continues to have palpitations. Continues to smoke 0.5 packs/day.    Past Medical History:  Diagnosis Date  . Anemia   . Anxiety   . Asthma   . Atrial fibrillation ([place])   . Bipolar 1 disorder ([place])   . Bulging lumbar disc   . Chronic pain of left knee   . COPD (chronic obstructive pulmonary disease) ([place])   . CVA (cerebral vascular accident) ([place])   . Diabetes mellitus   . Neuropathy   . TIA (transient ischemic attack)     Past Surgical History:  Procedure Laterality Date  . ABDOMINAL HYSTERECTOMY    . CARPAL TUNNEL RELEASE Right   . CHOLECYSTECTOMY    . KNEE ARTHROSCOPY WITH MEDIAL MENISECTOMY Right 04/14/2016   Procedure: KNEE ARTHROSCOPY WITH MEDIAL AND LATERAL MENISECTOMY, MICROFRACTURE REPAIR;  Surgeon: Rhonda Civil, MD;  Location: [facility];  Service: Orthopedics;  Laterality: Right;  lateral menisectomy - needs crutch training    Current Medications: Current Meds  Medication Sig  . albuterol (VENTOLIN HFA) 108 (90 Base) MCG/ACT  inhaler Inhale 2 puffs into the lungs every 6 (six) hours as needed for wheezing or shortness of breath.  Marland Kitchen amLODipine (NORVASC) 10 MG tablet Take 1 tablet (10 mg total) by mouth daily.  . budesonide-formoterol (SYMBICORT) 160-4.5 MCG/ACT inhaler Inhale 2 puffs into the lungs 2 (two) times daily.  . citalopram (CELEXA) 40 MG tablet Take 40 mg by mouth daily.  . diclofenac (VOLTAREN) 75 MG EC tablet TAKE (1) TABLET TWICE A DAY.  . divalproex (DEPAKOTE ER) 500 MG 24 hr tablet Take 500-1,000 mg by mouth See admin instructions. 500MG   in the morning and 1000mg  at bedtime  . FEROSUL 325 (65 Fe) MG tablet TAKE (1) TABLET DAILY WITH BREAKFAST.  Marland Kitchen gabapentin (NEURONTIN) 300 MG capsule Take 1 capsule (300 mg total) by mouth 3 (three) times daily.  Marland Kitchen GNP ULTICARE PEN NEEDLES 32G X 4 MM MISC   . HYDROcodone-acetaminophen (NORCO) 7.5-325 MG tablet Take 1 tablet by mouth 4 (four) times daily as needed.  . insulin glargine (LANTUS SOLOSTAR) 100 UNIT/ML Solostar Pen Inject 10 Units into the skin daily.  . insulin lispro (HUMALOG) 100 UNIT/ML cartridge 0-12 units 3 times daily before meals as per sliding scale  . Insulin Syringe-Needle U-100 (ADVOCATE INSULIN SYRINGE) 29G X 1/2" 0.3 ML MISC 161 applicators by Does not apply route 2 (two) times daily after a meal.  . JANUVIA 100 MG tablet TAKE 1 TABLET DAILY  . meloxicam (MOBIC) 7.5 MG tablet Take 7.5 mg by mouth daily.  . metFORMIN (GLUCOPHAGE) 1000 MG tablet Take 1 tablet (1,000 mg total) by mouth 2 (two) times daily with a meal.  . oxybutynin (DITROPAN) 5 MG tablet Take 1 tablet 2 (two) times daily.  Marland Kitchen PEG-KCl-NaCl-NaSulf-Na Asc-C (PLENVU) 140 g SOLR Take 140 g by mouth as directed. Manufacturer's coupon Universal coupon code:BIN: P2366821; GROUP: [id number]; PCN: [id number]; ID: [id number]; PAY NO MORE $50  . Vitamin D, Ergocalciferol, (DRISDOL) 1.25 MG (50000 UNIT) CAPS capsule Take 1 capsule (50,000 Units total) by mouth every 7 (seven) days.  Alveda Reasons 20 MG TABS tablet TAKE 1 TABLET WITH SUPPER     Allergies:   Aspirin   Social History   Socioeconomic History  . Marital status: Single    Spouse name: Not on file  . Number of children: 0  . Years of education: 18  . Highest education level: High school graduate  Occupational History  . Occupation: Unemployed  Tobacco Use  . Smoking status: Current Some Day Smoker    Packs/day: 0.50    Types: Cigarettes  . Smokeless tobacco: Never Used  . Tobacco comment: tobacco info given  Vaping Use  . Vaping Use: Some days  Substance  and Sexual Activity  . Alcohol use: No  . Drug use: No  . Sexual activity: Never  Other Topics Concern  . Not on file  Social History Narrative   Lives at home alone.   Right-handed.   No caffeine use.   Social Determinants of Health   Financial Resource Strain:   . Difficulty of Paying Living Expenses: Not on file  Food Insecurity:   . Worried About Charity fundraiser in the Last Year: Not on file  . Ran Out of Food in the Last Year: Not on file  Transportation Needs:   . Lack of Transportation (Medical): Not on file  . Lack of Transportation (Non-Medical): Not on file  Physical Activity:   . Days of Exercise per Week: Not on file  . Minutes of  Exercise per Session: Not on file  Stress:   . Feeling of Stress : Not on file  Social Connections:   . Frequency of Communication with Friends and Family: Not on file  . Frequency of Social Gatherings with Friends and Family: Not on file  . Attends Religious Services: Not on file  . Active Member of Clubs or Organizations: Not on file  . Attends Archivist Meetings: Not on file  . Marital Status: Not on file     Family History: The patient's family history includes Diabetes in her mother; Heart attack in her father.  ROS:   Please see the history of present illness.     All other systems reviewed and are negative.  EKGs/Labs/Other Studies Reviewed:    The following studies were reviewed today:   EKG:  EKG is ordered today.  The ekg ordered today demonstrates normal sinus rhythm, rate 62, nonspecific T wave flattening  Recent Labs: 12/19/2019: Magnesium 1.6 03/17/2020: ALT 15; BUN 15; Creatinine, Ser 0.91; Hemoglobin 11.5; Platelets 206; Potassium 4.2; Sodium 139; TSH 0.600  Recent Lipid Panel    Component Value Date/Time   CHOL 146 08/04/2019 0548   TRIG 161 (H) 08/04/2019 0548   HDL 13 (L) 08/04/2019 0548   CHOLHDL 11.2 08/04/2019 0548   VLDL 32 08/04/2019 0548   LDLCALC 101 (H) 08/04/2019 0548     Physical Exam:    VS:  BP 118/64 (Patient Position: Standing) Comment: standing  Pulse 67   Ht 6' (1.829 m)   Wt 282 lb 6.4 oz (128.1 kg)   SpO2 99%   BMI 38.30 kg/m     Wt Readings from Last 3 Encounters:  04/28/20 282 lb 6.4 oz (128.1 kg)  04/06/20 280 lb 3.2 oz (127.1 kg)  03/09/20 278 lb 12.8 oz (126.5 kg)     GEN: in no acute distress HEENT: Normal NECK: No JVD LYMPHATICS: No lymphadenopathy CARDIAC: RRR, no murmurs, rubs, gallops RESPIRATORY:  Clear to auscultation without rales, wheezing or rhonchi  ABDOMEN: Soft, non-tender, non-distended MUSCULOSKELETAL:  No edema SKIN: Warm and dry NEUROLOGIC:  Alert and oriented x 3 PSYCHIATRIC:  Normal affect   ASSESSMENT:    1. Dizziness   2. Lightheaded   3. Chest pain, unspecified type   4. Atrial fibrillation, unspecified type ([place])   5. Essential hypertension   6. Hyperlipidemia, unspecified hyperlipidemia type   7. Tobacco use    PLAN:    Dizziness: unclear cause.  Head CT negative last month.  Othostatics in clinic were unremarkable.  Possibly vertigo by description, will give trial of meclizine and schedule with neurology for further evaluation.  Will check Zio patch x3 days to rule out arrhythmia.  Chest pain: description suggests typical angina and has significant risk factors (type 2 diabetes, hypertension, tobacco use).  TTE on 08/03/2019 showed LVEF 60-65%, normal RV function, normal biatrial size, no significant valvular disease.  Lexiscan Myoview on 01/03/2020 showed low risk stress test with breast attenuation but no ischemia, LVEF 48%.  No further cardiac work-up recommended.  Atrial fibrillation: CHA2DS2-VASc score 5 (hypertension, diabetes, CVA, female).  Zio patch x14 days on 01/29/2020 showed 1% atrial flutter burden, average heart rate 86 bpm. -Continue Xarelto -Continue metoprolol 25 mg twice daily  Hypertension: On amlodipine 10 mg daily, lisinopril 20 mg daily, metoprolol 25 mg twice daily.     T2DM: On insulin.  A1c 8.9  Hyperlipidemia: Given history of diabetes and possible CVA, should be on statin, started rosuvastatin 10  mg daily at last clinic visit.  Will check lipid panel.  Tobacco use: Patient counseled on the risks of tobacco use and cessation strongly encouraged  TIA: Follows with neurology.  Carotid duplex on 01/14/2020 showed bilateral 1 to 39% stenosis.  Zio patch x14 days on 01/29/2020 showed 1% atrial flutter burden, average heart rate 86 bpm.  Snoring: sleep study mild OSA, no indication for CPAP at this time  RTC in 3 months  Medication Adjustments/Labs and Tests Ordered: Current medicines are reviewed at length with the patient today.  Concerns regarding medicines are outlined above.  Orders Placed This Encounter  Procedures  . LONG TERM MONITOR (3-14 DAYS)  . EKG 12-Lead   Meds ordered this encounter  Medications  . meclizine (ANTIVERT) 12.5 MG tablet    Sig: Take 1 tablet (12.5 mg total) by mouth in the morning, at noon, and at bedtime. For 1 week    Dispense:  21 tablet    Refill:  0    Patient Instructions  Medication Instructions:  START meclizine 12.5 mg every 8 hours (3 times daily) for 1 week-call to let us know if this has improved the dizziness.  *If you need a refill on your cardiac medications before your next appointment, please call your pharmacy*  Testing/Procedures:  [place] Monitor Instructions   Your physician has requested you wear your ZIO patch monitor 3 days.   This is a single patch monitor.  [employer] supplies one patch monitor per enrollment.  Additional stickers are not available.   Please do not apply patch if you will be having a Nuclear Stress Test, Echocardiogram, Cardiac CT, MRI, or Chest Xray during the time frame you would be wearing the monitor. The patch cannot be worn during these tests.  You cannot remove and re-apply the ZIO XT patch monitor.   Your ZIO patch monitor will be sent USPS Priority mail  from [facility] directly to your home address. The monitor may also be mailed to a PO BOX if home delivery is not available.   It may take 3-5 days to receive your monitor after you have been enrolled.   Once you have received you monitor, please review enclosed instructions.  Your monitor has already been registered assigning a specific monitor serial # to you.   Applying the monitor   Shave hair from upper left chest.   Hold abrader disc by orange tab.  Rub abrader in 40 strokes over left upper chest as indicated in your monitor instructions.   Clean area with 4 enclosed alcohol pads .  Use all pads to assure are is cleaned thoroughly.  Let dry.   Apply patch as indicated in monitor instructions.  Patch will be place under collarbone on left side of chest with arrow pointing upward.   Rub patch adhesive wings for 2 minutes.Remove white label marked "1".  Remove white label marked "2".  Rub patch adhesive wings for 2 additional minutes.   While looking in a mirror, press and release button in center of patch.  A small green light will flash 3-4 times .  This will be your only indicator the monitor has been turned on.     Do not shower for the first 24 hours.  You may shower after the first 24 hours.   Press button if you feel a symptom. You will hear a small click.  Record Date, Time and Symptom in the Patient Log Book.   When you are ready  to remove patch, follow instructions on last 2 pages of Patient Log Book.  Stick patch monitor onto last page of Patient Log Book.   Place Patient Log Book in [place] box.  Use locking tab on box and tape box closed securely.  The [employer] has [employer] on it.  Please place in mailbox as soon as possible.  Your physician should have your test results approximately 7 days after the monitor has been mailed back to [facility].   Call [place] at [phone] if you have questions regarding your ZIO XT patch  monitor.  Call them immediately if you see an orange light blinking on your monitor.   If your monitor falls off in less than 4 days contact our Monitor department at [phone].  If your monitor becomes loose or falls off after 4 days call [employer] at [phone] for suggestions on securing your monitor.   Follow-Up: At [facility], you and your health needs are our priority.  As part of our continuing mission to provide you with exceptional heart care, we have created designated Provider Care Teams.  These Care Teams include your primary Cardiologist (physician) and Advanced Practice Providers (APPs -  Physician Assistants and Nurse Practitioners) who all work together to provide you with the care you need, when you need it.  We recommend signing up for the patient portal called "MyChart".  Sign up information is provided on this After Visit Summary.  MyChart is used to connect with patients for Virtual Visits (Telemedicine).  Patients are able to view lab/test results, encounter notes, upcoming appointments, etc.  Non-urgent messages can be sent to your provider as well.   To learn more about what you can do with MyChart, go to [email or web address].    Your next appointment:   3 month(s)  The format for your next appointment:   In Person  Provider:   Oswaldo Milian, MD   Other Instructions Follow up with Neurologist asap-Dr. Krista Blue     Signed, [name], MD  04/28/2020 5:06 PM    [place]

## 2020-04-27 DIAGNOSIS — E1142 Type 2 diabetes mellitus with diabetic polyneuropathy: Secondary | ICD-10-CM | POA: Diagnosis not present

## 2020-04-28 ENCOUNTER — Ambulatory Visit: Payer: Medicaid Other | Admitting: Cardiology

## 2020-04-28 ENCOUNTER — Telehealth: Payer: Self-pay

## 2020-04-28 ENCOUNTER — Other Ambulatory Visit: Payer: Self-pay

## 2020-04-28 ENCOUNTER — Encounter: Payer: Self-pay | Admitting: *Deleted

## 2020-04-28 ENCOUNTER — Encounter: Payer: Self-pay | Admitting: Cardiology

## 2020-04-28 VITALS — BP 118/64 | HR 67 | Ht 72.0 in | Wt 282.4 lb

## 2020-04-28 DIAGNOSIS — I10 Essential (primary) hypertension: Secondary | ICD-10-CM

## 2020-04-28 DIAGNOSIS — E1142 Type 2 diabetes mellitus with diabetic polyneuropathy: Secondary | ICD-10-CM | POA: Diagnosis not present

## 2020-04-28 DIAGNOSIS — R42 Dizziness and giddiness: Secondary | ICD-10-CM | POA: Diagnosis not present

## 2020-04-28 DIAGNOSIS — Z7689 Persons encountering health services in other specified circumstances: Secondary | ICD-10-CM | POA: Diagnosis not present

## 2020-04-28 DIAGNOSIS — Z720 Tobacco use: Secondary | ICD-10-CM

## 2020-04-28 DIAGNOSIS — E785 Hyperlipidemia, unspecified: Secondary | ICD-10-CM

## 2020-04-28 DIAGNOSIS — I4891 Unspecified atrial fibrillation: Secondary | ICD-10-CM

## 2020-04-28 DIAGNOSIS — R079 Chest pain, unspecified: Secondary | ICD-10-CM

## 2020-04-28 MED ORDER — MECLIZINE HCL 12.5 MG PO TABS: 12.5000 mg | ORAL_TABLET | Freq: Three times a day (TID) | ORAL | 0 refills | Status: DC

## 2020-04-28 NOTE — Patient Instructions (Addendum)
Medication Instructions:  START meclizine 12.5 mg every 8 hours (3 times daily) for 1 week-call to let us know if this has improved the dizziness.  *If you need a refill on your cardiac medications before your next appointment, please call your pharmacy*  Testing/Procedures:  [place] Monitor Instructions   Your physician has requested you wear your ZIO patch monitor 3 days.   This is a single patch monitor.  [employer] supplies one patch monitor per enrollment.  Additional stickers are not available.   Please do not apply patch if you will be having a Nuclear Stress Test, Echocardiogram, Cardiac CT, MRI, or Chest Xray during the time frame you would be wearing the monitor. The patch cannot be worn during these tests.  You cannot remove and re-apply the ZIO XT patch monitor.   Your ZIO patch monitor will be sent USPS Priority mail from [facility] directly to your home address. The monitor may also be mailed to a PO BOX if home delivery is not available.   It may take 3-5 days to receive your monitor after you have been enrolled.   Once you have received you monitor, please review enclosed instructions.  Your monitor has already been registered assigning a specific monitor serial # to you.   Applying the monitor   Shave hair from upper left chest.   Hold abrader disc by orange tab.  Rub abrader in 40 strokes over left upper chest as indicated in your monitor instructions.   Clean area with 4 enclosed alcohol pads .  Use all pads to assure are is cleaned thoroughly.  Let dry.   Apply patch as indicated in monitor instructions.  Patch will be place under collarbone on left side of chest with arrow pointing upward.   Rub patch adhesive wings for 2 minutes.Remove white label marked "1".  Remove white label marked "2".  Rub patch adhesive wings for 2 additional minutes.   While looking in a mirror, press and release button in center of patch.  A small green light will flash  3-4 times .  This will be your only indicator the monitor has been turned on.     Do not shower for the first 24 hours.  You may shower after the first 24 hours.   Press button if you feel a symptom. You will hear a small click.  Record Date, Time and Symptom in the Patient Log Book.   When you are ready to remove patch, follow instructions on last 2 pages of Patient Log Book.  Stick patch monitor onto last page of Patient Log Book.   Place Patient Log Book in [place] box.  Use locking tab on box and tape box closed securely.  The [employer] has [employer] on it.  Please place in mailbox as soon as possible.  Your physician should have your test results approximately 7 days after the monitor has been mailed back to [facility].   Call [place] at [phone] if you have questions regarding your ZIO XT patch monitor.  Call them immediately if you see an orange light blinking on your monitor.   If your monitor falls off in less than 4 days contact our Monitor department at [phone].  If your monitor becomes loose or falls off after 4 days call [employer] at [phone] for suggestions on securing your monitor.   Follow-Up: At [facility], you and your health needs are our priority.  As part of  our continuing mission to provide you with exceptional heart care, we have created designated Provider Care Teams.  These Care Teams include your primary Cardiologist (physician) and Advanced Practice Providers (APPs -  Physician Assistants and Nurse Practitioners) who all work together to provide you with the care you need, when you need it.  We recommend signing up for the patient portal called "MyChart".  Sign up information is provided on this After Visit Summary.  MyChart is used to connect with patients for Virtual Visits (Telemedicine).  Patients are able to view lab/test results, encounter notes, upcoming appointments, etc.  Non-urgent messages can be sent to  your provider as well.   To learn more about what you can do with MyChart, go to [email or web address].    Your next appointment:   3 month(s)  The format for your next appointment:   In Person  Provider:   Oswaldo Milian, MD   Other Instructions Follow up with Neurologist asap-Dr. Krista Blue

## 2020-04-28 NOTE — Progress Notes (Signed)
Patient ID: Rhonda Hickman, female   DOB: 25-Dec-1955, 64 y.o.   MRN: [id number] Patient enrolled for [employer] to ship a 3 day ZIO XT long term holter monitor to her home.

## 2020-04-28 NOTE — Telephone Encounter (Signed)
Spoke to the patient. She has decided to cancel the colonoscopy for now. She wanted to move the date until after her cardiac work up has been completed. She states she fell and hit her head from the dizzy spells she has been having. rescheduled for 06-10-2020. New instructions have been mailed to home address on file

## 2020-04-28 NOTE — Telephone Encounter (Signed)
-----   Message from Thornton Park, MD sent at 04/28/2020  4:15 PM EDT ----- Many thanks for the quick reply.  Joelene Millin ----- Message ----- From: [name], MD Sent: 04/28/2020   4:09 PM EDT To: Thornton Park, MD  She did not mention that, but should not be a problem, she is fine to hold her Xarelto for colonoscopy Thanks, Gerald Stabs ----- Message ----- From: Thornton Park, MD Sent: 04/28/2020   3:55 PM EDT To: Elias Else, CMA, [name], MD  Mrs. Askari is scheduled for a colonoscopy 04/30/20. Can we safely hold her Xarelto between now and then or should we reschedule her procedure? (She was supposed to discuss this with you during your appointment today).  Many thanks for your input.  Thornton Park [facility]

## 2020-04-29 DIAGNOSIS — E1142 Type 2 diabetes mellitus with diabetic polyneuropathy: Secondary | ICD-10-CM | POA: Diagnosis not present

## 2020-04-30 ENCOUNTER — Encounter: Payer: Medicaid Other | Admitting: Gastroenterology

## 2020-04-30 DIAGNOSIS — E1142 Type 2 diabetes mellitus with diabetic polyneuropathy: Secondary | ICD-10-CM | POA: Diagnosis not present

## 2020-05-01 DIAGNOSIS — Z79899 Other long term (current) drug therapy: Secondary | ICD-10-CM | POA: Diagnosis not present

## 2020-05-01 DIAGNOSIS — E1142 Type 2 diabetes mellitus with diabetic polyneuropathy: Secondary | ICD-10-CM | POA: Diagnosis not present

## 2020-05-01 DIAGNOSIS — G8929 Other chronic pain: Secondary | ICD-10-CM | POA: Diagnosis not present

## 2020-05-01 DIAGNOSIS — M5416 Radiculopathy, lumbar region: Secondary | ICD-10-CM | POA: Diagnosis not present

## 2020-05-01 DIAGNOSIS — E119 Type 2 diabetes mellitus without complications: Secondary | ICD-10-CM | POA: Diagnosis not present

## 2020-05-01 DIAGNOSIS — Z7689 Persons encountering health services in other specified circumstances: Secondary | ICD-10-CM | POA: Diagnosis not present

## 2020-05-02 DIAGNOSIS — E1142 Type 2 diabetes mellitus with diabetic polyneuropathy: Secondary | ICD-10-CM | POA: Diagnosis not present

## 2020-05-03 DIAGNOSIS — E1142 Type 2 diabetes mellitus with diabetic polyneuropathy: Secondary | ICD-10-CM | POA: Diagnosis not present

## 2020-05-04 ENCOUNTER — Ambulatory Visit (INDEPENDENT_AMBULATORY_CARE_PROVIDER_SITE_OTHER): Payer: Medicaid Other | Admitting: Primary Care

## 2020-05-04 ENCOUNTER — Other Ambulatory Visit: Payer: Self-pay

## 2020-05-04 ENCOUNTER — Encounter (INDEPENDENT_AMBULATORY_CARE_PROVIDER_SITE_OTHER): Payer: Self-pay | Admitting: Primary Care

## 2020-05-04 ENCOUNTER — Other Ambulatory Visit (INDEPENDENT_AMBULATORY_CARE_PROVIDER_SITE_OTHER): Payer: Self-pay

## 2020-05-04 VITALS — BP 113/67 | HR 63 | Temp 97.2°F | Ht 75.0 in | Wt 283.6 lb

## 2020-05-04 DIAGNOSIS — Z794 Long term (current) use of insulin: Secondary | ICD-10-CM | POA: Diagnosis not present

## 2020-05-04 DIAGNOSIS — R300 Dysuria: Secondary | ICD-10-CM

## 2020-05-04 DIAGNOSIS — E1100 Type 2 diabetes mellitus with hyperosmolarity without nonketotic hyperglycemic-hyperosmolar coma (NKHHC): Secondary | ICD-10-CM

## 2020-05-04 DIAGNOSIS — E1142 Type 2 diabetes mellitus with diabetic polyneuropathy: Secondary | ICD-10-CM | POA: Diagnosis not present

## 2020-05-04 LAB — POCT URINALYSIS DIPSTICK
Bilirubin, UA: NEGATIVE
Glucose, UA: POSITIVE — AB
Nitrite, UA: POSITIVE
Protein, UA: POSITIVE — AB
Spec Grav, UA: 1.02 (ref 1.010–1.025)
Urobilinogen, UA: 0.2 E.U./dL
pH, UA: 5.5 (ref 5.0–8.0)

## 2020-05-04 MED ORDER — PHENAZOPYRIDINE HCL 100 MG PO TABS: 100.0000 mg | ORAL_TABLET | Freq: Three times a day (TID) | ORAL | 0 refills | Status: DC | PRN

## 2020-05-04 NOTE — Patient Instructions (Addendum)
Urine culture sent.  We will call you with the results.   Push fluids and get plenty of rest.   Phenazopyridine (PYRIDIUM) 100 MG tablet  as needed for symptomatic relief

## 2020-05-04 NOTE — Progress Notes (Signed)
[facility]   CC: Burning with urination  SUBJECTIVE:  [name] is a 64 y.o. female who complains of urinary frequency, urgency and dysuria for the past 14 days.  Patient denies a precipitating event, recent sexual encounter, 24 oz of coffee and sodas caffeine intake.  Localizes the pain to the lower abdomen.  Pain is  constant and describes it as sharp/ burning.  .  Symptoms are made worse with urination.   Denies fever, chills, nausea, vomiting, abdominal pain, flank pain, abnormal vaginal discharge or bleeding, hematuria.    LMP: No LMP recorded. Patient has had a hysterectomy.  ROS: As in HPI.  All other pertinent ROS negative.     Past Medical History:  Diagnosis Date  . Anemia   . Anxiety   . Asthma   . Atrial fibrillation ([place])   . Bipolar 1 disorder ([place])   . Bulging lumbar disc   . Chronic pain of left knee   . COPD (chronic obstructive pulmonary disease) ([place])   . CVA (cerebral vascular accident) ([place])   . Diabetes mellitus   . Neuropathy   . TIA (transient ischemic attack)    Past Surgical History:  Procedure Laterality Date  . ABDOMINAL HYSTERECTOMY    . CARPAL TUNNEL RELEASE Right   . CHOLECYSTECTOMY    . KNEE ARTHROSCOPY WITH MEDIAL MENISECTOMY Right 04/14/2016   Procedure: KNEE ARTHROSCOPY WITH MEDIAL AND LATERAL MENISECTOMY, MICROFRACTURE REPAIR;  Surgeon: Carole Civil, MD;  Location: [facility];  Service: Orthopedics;  Laterality: Right;  lateral menisectomy - needs crutch training   Allergies  Allergen Reactions  . Aspirin Nausea Only and Other (See Comments)    Causes stomach pain   Current Outpatient Medications on File Prior to Visit  Medication Sig Dispense Refill  . albuterol (VENTOLIN HFA) 108 (90 Base) MCG/ACT inhaler Inhale 2 puffs into the lungs every 6 (six) hours as needed for wheezing or shortness of breath. 8 g 1  . amLODipine (NORVASC) 10 MG tablet Take 1 tablet (10 mg total) by mouth daily. 90 tablet 3   . budesonide-formoterol (SYMBICORT) 160-4.5 MCG/ACT inhaler Inhale 2 puffs into the lungs 2 (two) times daily. 1 Inhaler 3  . citalopram (CELEXA) 40 MG tablet Take 40 mg by mouth daily.    . diclofenac (VOLTAREN) 75 MG EC tablet TAKE (1) TABLET TWICE A DAY. 60 tablet 0  . divalproex (DEPAKOTE ER) 500 MG 24 hr tablet Take 500-1,000 mg by mouth See admin instructions. 500MG  in the morning and 1000mg  at bedtime    . FEROSUL 325 (65 Fe) MG tablet TAKE (1) TABLET DAILY WITH BREAKFAST. 90 tablet 0  . gabapentin (NEURONTIN) 300 MG capsule Take 1 capsule (300 mg total) by mouth 3 (three) times daily. 270 capsule 11  . GNP ULTICARE PEN NEEDLES 32G X 4 MM MISC     . HYDROcodone-acetaminophen (NORCO) 7.5-325 MG tablet Take 1 tablet by mouth 4 (four) times daily as needed.    . insulin glargine (LANTUS SOLOSTAR) 100 UNIT/ML Solostar Pen Inject 10 Units into the skin daily. 15 mL 3  . insulin lispro (HUMALOG) 100 UNIT/ML cartridge 0-12 units 3 times daily before meals as per sliding scale 15 mL 3  . Insulin Syringe-Needle U-100 (ADVOCATE INSULIN SYRINGE) 29G X 1/2" 0.3 ML MISC 256 applicators by Does not apply route 2 (two) times daily after a meal. 200 each 6  . JANUVIA 100 MG tablet TAKE 1 TABLET DAILY 30 tablet 2  .  meclizine (ANTIVERT) 12.5 MG tablet Take 1 tablet (12.5 mg total) by mouth in the morning, at noon, and at bedtime. For 1 week 21 tablet 0  . meloxicam (MOBIC) 7.5 MG tablet Take 7.5 mg by mouth daily.    . metFORMIN (GLUCOPHAGE) 1000 MG tablet Take 1 tablet (1,000 mg total) by mouth 2 (two) times daily with a meal. 180 tablet 3  . oxybutynin (DITROPAN) 5 MG tablet Take 1 tablet 2 (two) times daily. 60 tablet 3  . PEG-KCl-NaCl-NaSulf-Na Asc-C (PLENVU) 140 g SOLR Take 140 g by mouth as directed. Manufacturer's coupon Universal coupon code:BIN: P2366821; GROUP: [id number]; PCN: [id number]; ID: [id number]; PAY NO MORE $50 1 each 0  . Vitamin D, Ergocalciferol, (DRISDOL) 1.25 MG (50000 UNIT) CAPS capsule  Take 1 capsule (50,000 Units total) by mouth every 7 (seven) days. 32 capsule 0  . XARELTO 20 MG TABS tablet TAKE 1 TABLET WITH SUPPER 30 tablet 0  . metoprolol tartrate (LOPRESSOR) 25 MG tablet Take 1 tablet (25 mg total) by mouth 2 (two) times daily. 180 tablet 3  . rosuvastatin (CRESTOR) 10 MG tablet Take 1 tablet (10 mg total) by mouth daily. 90 tablet 3   No current facility-administered medications on file prior to visit.   Social History   Socioeconomic History  . Marital status: Single    Spouse name: Not on file  . Number of children: 0  . Years of education: 68  . Highest education level: High school graduate  Occupational History  . Occupation: Unemployed  Tobacco Use  . Smoking status: Current Some Day Smoker    Packs/day: 0.50    Types: Cigarettes  . Smokeless tobacco: Never Used  . Tobacco comment: tobacco info given  Vaping Use  . Vaping Use: Some days  Substance and Sexual Activity  . Alcohol use: No  . Drug use: No  . Sexual activity: Never  Other Topics Concern  . Not on file  Social History Narrative   Lives at home alone.   Right-handed.   No caffeine use.   Social Determinants of Health   Financial Resource Strain:   . Difficulty of Paying Living Expenses: Not on file  Food Insecurity:   . Worried About Charity fundraiser in the Last Year: Not on file  . Ran Out of Food in the Last Year: Not on file  Transportation Needs:   . Lack of Transportation (Medical): Not on file  . Lack of Transportation (Non-Medical): Not on file  Physical Activity:   . Days of Exercise per Week: Not on file  . Minutes of Exercise per Session: Not on file  Stress:   . Feeling of Stress : Not on file  Social Connections:   . Frequency of Communication with Friends and Family: Not on file  . Frequency of Social Gatherings with Friends and Family: Not on file  . Attends Religious Services: Not on file  . Active Member of Clubs or Organizations: Not on file  .  Attends Archivist Meetings: Not on file  . Marital Status: Not on file  Intimate Partner Violence:   . Fear of Current or Ex-Partner: Not on file  . Emotionally Abused: Not on file  . Physically Abused: Not on file  . Sexually Abused: Not on file   Family History  Problem Relation Age of Onset  . Diabetes Mother   . Heart attack Father     OBJECTIVE:  Vitals:   05/04/20 1336  BP: 113/67  Pulse: 63  Temp: (!) 97.2 F (36.2 C)  TempSrc: Temporal  SpO2: 96%  Weight: 283 lb 9.6 oz (128.6 kg)  Height: 6\' 3"  (1.905 m)   General appearance: AOx3 in no acute distress HEENT: NCAT.  Aphasia s/p stroke  Lungs: clear to auscultation bilaterally without adventitious breath sounds Heart: regular rate and rhythm.  Radial pulses 2+ symmetrical bilaterally Abdomen: soft; non-distended; no tenderness; bowel sounds present; no guarding or rebound tenderness Back: no CVA tenderness Extremities: no edema; symmetrical with no gross deformities Skin: warm and dry Neurologic: Ambulates from chair to exam table without difficulty Psychological: alert and cooperative; normal mood and affect    ASSESSMENT & PLAN: [place] was seen today for dysuria.  Diagnoses and all orders for this visit:  Dysuria Urinalysis results positive glucose, protein, and large leukocytes 3+. Sent in [place] for urinary pain The urine will be sent for culture and sensitivity. -     Urinalysis Dipstick

## 2020-05-05 ENCOUNTER — Telehealth (INDEPENDENT_AMBULATORY_CARE_PROVIDER_SITE_OTHER): Payer: Self-pay

## 2020-05-05 ENCOUNTER — Telehealth: Payer: Self-pay | Admitting: *Deleted

## 2020-05-05 DIAGNOSIS — E1142 Type 2 diabetes mellitus with diabetic polyneuropathy: Secondary | ICD-10-CM | POA: Diagnosis not present

## 2020-05-05 LAB — MICROALBUMIN / CREATININE URINE RATIO
Creatinine, Urine: 128.5 mg/dL
Microalb/Creat Ratio: 100 mg/g creat — ABNORMAL HIGH (ref 0–29)
Microalbumin, Urine: 128.6 ug/mL

## 2020-05-05 NOTE — Telephone Encounter (Signed)
Patient has had 2 ZIO XT monitors shipped to her home.  Patient stated, both monitors would not activate after she applied them.  Will reach out to patient to see if she would like to be scheduled to come to [facility] office to apply.  If not we will re-enroll her for a 3rd ZIO XT monitor to be shipped and explain application instructions. Patient called.  I will re-enroll her to have a 3rd ZIO XT monitor shipped to her home.  She will have EMS apply, (they applied first monitor).

## 2020-05-05 NOTE — Telephone Encounter (Signed)
Patient is aware that labs reveal early signs that diabetes may be affecting her kidneys. Advised her to ensure she is following a diabetic diet and taking both diabetic and hypertension medications as directed to slow down progression. Advised that PCP may decide to change or adjust blood pressure medications and if so we will give her a call. She verbalized understanding. Nat Christen, CMA

## 2020-05-05 NOTE — Telephone Encounter (Signed)
-----   Message from Charlott Rakes, MD sent at 05/05/2020  2:28 PM EDT ----- Labs reveal slight protein in her urine which can be due to early signs of diabetes affecting her kidneys.  Compliance with her diabetic diet and diabetic medications will help bring about improvement.  Blood pressure medications like lisinopril can also help slow down the progression and I have copied her PCP on this for possible antihypertensive regimen change.

## 2020-05-06 DIAGNOSIS — E1142 Type 2 diabetes mellitus with diabetic polyneuropathy: Secondary | ICD-10-CM | POA: Diagnosis not present

## 2020-05-07 ENCOUNTER — Telehealth: Payer: Self-pay | Admitting: *Deleted

## 2020-05-07 DIAGNOSIS — E1142 Type 2 diabetes mellitus with diabetic polyneuropathy: Secondary | ICD-10-CM | POA: Diagnosis not present

## 2020-05-07 NOTE — Telephone Encounter (Signed)
Patient scheduled to come into [facility] office to have a ZIO XT long term holter monitor applied.  Address and appointment time texted to patient.

## 2020-05-07 NOTE — Telephone Encounter (Signed)
Patient has had 3 ZIO XT long term holter monitors shipped to her home.  She states, each monitor would not start and flashed orange lights.   She will mail 3rd monitor back to [facility] and states she will contact our office to have a monitor applied in the office.

## 2020-05-08 DIAGNOSIS — E1142 Type 2 diabetes mellitus with diabetic polyneuropathy: Secondary | ICD-10-CM | POA: Diagnosis not present

## 2020-05-08 LAB — URINE CULTURE

## 2020-05-09 DIAGNOSIS — E1142 Type 2 diabetes mellitus with diabetic polyneuropathy: Secondary | ICD-10-CM | POA: Diagnosis not present

## 2020-05-10 DIAGNOSIS — E1142 Type 2 diabetes mellitus with diabetic polyneuropathy: Secondary | ICD-10-CM | POA: Diagnosis not present

## 2020-05-11 ENCOUNTER — Telehealth: Payer: Self-pay | Admitting: Cardiology

## 2020-05-11 ENCOUNTER — Telehealth (INDEPENDENT_AMBULATORY_CARE_PROVIDER_SITE_OTHER): Payer: Self-pay

## 2020-05-11 ENCOUNTER — Other Ambulatory Visit (INDEPENDENT_AMBULATORY_CARE_PROVIDER_SITE_OTHER): Payer: Self-pay | Admitting: Primary Care

## 2020-05-11 DIAGNOSIS — E1142 Type 2 diabetes mellitus with diabetic polyneuropathy: Secondary | ICD-10-CM | POA: Diagnosis not present

## 2020-05-11 MED ORDER — SULFAMETHOXAZOLE-TRIMETHOPRIM 800-160 MG PO TABS: 1.0000 | ORAL_TABLET | Freq: Two times a day (BID) | ORAL | 0 refills | Status: DC

## 2020-05-11 MED ORDER — FLUCONAZOLE 150 MG PO TABS: 150.0000 mg | ORAL_TABLET | Freq: Once | ORAL | 0 refills | Status: AC

## 2020-05-11 NOTE — Telephone Encounter (Signed)
New message:    Patient need to cancel her apt because of transportation. Please call patient back.

## 2020-05-11 NOTE — Telephone Encounter (Signed)
-----   Message from [name], NP sent at 05/11/2020  9:59 AM EDT ----- UTI sensitive to Septra Ds sent abt and diflucan if needed

## 2020-05-11 NOTE — Telephone Encounter (Signed)
Patient aware that she has a UTI, antibiotics sent to treat. Medication to treat for yeast( if needed) also sent. Advised patient to complete abx first before taking diflucan. She verbalized understanding. Nat Christen, CMA

## 2020-05-12 ENCOUNTER — Telehealth: Payer: Self-pay | Admitting: Cardiology

## 2020-05-12 DIAGNOSIS — E1142 Type 2 diabetes mellitus with diabetic polyneuropathy: Secondary | ICD-10-CM | POA: Diagnosis not present

## 2020-05-12 DIAGNOSIS — R42 Dizziness and giddiness: Secondary | ICD-10-CM

## 2020-05-12 NOTE — Telephone Encounter (Signed)
Covering in Triage today. The patient was recently seen by Dr. Gardiner Rhyme for evaluation of dizziness.  Pending monitor set up.  She was prescribed meclizine but now she ran out.  Patient reported that her dizziness was improved while taking meclizine.  She was referred to neurology but no appointment made yet.  Patient is asking for further refill of meclizine.  Will defer to Dr. Gardiner Rhyme for further recommendation.

## 2020-05-12 NOTE — Telephone Encounter (Signed)
STAT if patient feels like he/she is going to faint   1) Are you dizzy now? Patient states it is mild  2) Do you feel faint or have you passed out? No   3) Do you have any other symptoms? No   4) Have you checked your HR and BP (record if available)? No   Patient states she is completely out of the medication previously prescribed by Dr. Gardiner Rhyme for dizziness. She is requesting an additional supply.

## 2020-05-12 NOTE — Telephone Encounter (Signed)
We an refill the meclizine until she sees neurology

## 2020-05-13 DIAGNOSIS — E1142 Type 2 diabetes mellitus with diabetic polyneuropathy: Secondary | ICD-10-CM | POA: Diagnosis not present

## 2020-05-13 MED ORDER — MECLIZINE HCL 12.5 MG PO TABS
12.5000 mg | ORAL_TABLET | Freq: Three times a day (TID) | ORAL | 1 refills | Status: DC | PRN
Start: 2020-05-13 — End: 2020-07-16

## 2020-05-13 NOTE — Telephone Encounter (Signed)
Medication refill sent to pharmacy  

## 2020-05-14 DIAGNOSIS — E1142 Type 2 diabetes mellitus with diabetic polyneuropathy: Secondary | ICD-10-CM | POA: Diagnosis not present

## 2020-05-15 DIAGNOSIS — F329 Major depressive disorder, single episode, unspecified: Secondary | ICD-10-CM | POA: Diagnosis not present

## 2020-05-15 DIAGNOSIS — E1142 Type 2 diabetes mellitus with diabetic polyneuropathy: Secondary | ICD-10-CM | POA: Diagnosis not present

## 2020-05-16 DIAGNOSIS — E1142 Type 2 diabetes mellitus with diabetic polyneuropathy: Secondary | ICD-10-CM | POA: Diagnosis not present

## 2020-05-17 DIAGNOSIS — E1142 Type 2 diabetes mellitus with diabetic polyneuropathy: Secondary | ICD-10-CM | POA: Diagnosis not present

## 2020-05-18 DIAGNOSIS — E1142 Type 2 diabetes mellitus with diabetic polyneuropathy: Secondary | ICD-10-CM | POA: Diagnosis not present

## 2020-05-19 DIAGNOSIS — E1142 Type 2 diabetes mellitus with diabetic polyneuropathy: Secondary | ICD-10-CM | POA: Diagnosis not present

## 2020-05-19 NOTE — Telephone Encounter (Signed)
Patient states she was told to call the office when she is out of the medication. She states she finished it and still has not seen the neurologist.

## 2020-05-19 NOTE — Telephone Encounter (Signed)
Left message for pt to call us back. 

## 2020-05-19 NOTE — Telephone Encounter (Signed)
Pt was unaware that refill of Antivert had been sent to [facility] on 11/3. Additionally, pt did not realize she needed to contact the neurology office to make an appt (she was expecting them to call her and was perplexed as to why she had not heard anything.)  Pt verbalizes understanding that her refill is available and states she will reach out to neurology office to make an appointment to have her dizziness evaluated. States she will notify our office with further questions or concerns.

## 2020-05-20 DIAGNOSIS — E1142 Type 2 diabetes mellitus with diabetic polyneuropathy: Secondary | ICD-10-CM | POA: Diagnosis not present

## 2020-05-21 DIAGNOSIS — E1142 Type 2 diabetes mellitus with diabetic polyneuropathy: Secondary | ICD-10-CM | POA: Diagnosis not present

## 2020-05-22 ENCOUNTER — Other Ambulatory Visit (INDEPENDENT_AMBULATORY_CARE_PROVIDER_SITE_OTHER): Payer: Self-pay | Admitting: Primary Care

## 2020-05-22 DIAGNOSIS — E1142 Type 2 diabetes mellitus with diabetic polyneuropathy: Secondary | ICD-10-CM | POA: Diagnosis not present

## 2020-05-23 DIAGNOSIS — E1142 Type 2 diabetes mellitus with diabetic polyneuropathy: Secondary | ICD-10-CM | POA: Diagnosis not present

## 2020-05-24 DIAGNOSIS — E1142 Type 2 diabetes mellitus with diabetic polyneuropathy: Secondary | ICD-10-CM | POA: Diagnosis not present

## 2020-05-25 ENCOUNTER — Ambulatory Visit (INDEPENDENT_AMBULATORY_CARE_PROVIDER_SITE_OTHER): Payer: Medicaid Other

## 2020-05-25 ENCOUNTER — Other Ambulatory Visit: Payer: Self-pay

## 2020-05-25 DIAGNOSIS — R42 Dizziness and giddiness: Secondary | ICD-10-CM | POA: Diagnosis not present

## 2020-05-25 DIAGNOSIS — E1142 Type 2 diabetes mellitus with diabetic polyneuropathy: Secondary | ICD-10-CM | POA: Diagnosis not present

## 2020-05-25 DIAGNOSIS — Z7689 Persons encountering health services in other specified circumstances: Secondary | ICD-10-CM | POA: Diagnosis not present

## 2020-05-26 DIAGNOSIS — E1142 Type 2 diabetes mellitus with diabetic polyneuropathy: Secondary | ICD-10-CM | POA: Diagnosis not present

## 2020-05-27 DIAGNOSIS — G8929 Other chronic pain: Secondary | ICD-10-CM | POA: Diagnosis not present

## 2020-05-27 DIAGNOSIS — E1142 Type 2 diabetes mellitus with diabetic polyneuropathy: Secondary | ICD-10-CM | POA: Diagnosis not present

## 2020-05-27 DIAGNOSIS — M5416 Radiculopathy, lumbar region: Secondary | ICD-10-CM | POA: Diagnosis not present

## 2020-05-27 DIAGNOSIS — Z7689 Persons encountering health services in other specified circumstances: Secondary | ICD-10-CM | POA: Diagnosis not present

## 2020-05-27 DIAGNOSIS — E119 Type 2 diabetes mellitus without complications: Secondary | ICD-10-CM | POA: Diagnosis not present

## 2020-05-27 DIAGNOSIS — Z79899 Other long term (current) drug therapy: Secondary | ICD-10-CM | POA: Diagnosis not present

## 2020-05-28 DIAGNOSIS — E1142 Type 2 diabetes mellitus with diabetic polyneuropathy: Secondary | ICD-10-CM | POA: Diagnosis not present

## 2020-05-29 DIAGNOSIS — E1142 Type 2 diabetes mellitus with diabetic polyneuropathy: Secondary | ICD-10-CM | POA: Diagnosis not present

## 2020-05-30 DIAGNOSIS — E1142 Type 2 diabetes mellitus with diabetic polyneuropathy: Secondary | ICD-10-CM | POA: Diagnosis not present

## 2020-05-31 DIAGNOSIS — E1142 Type 2 diabetes mellitus with diabetic polyneuropathy: Secondary | ICD-10-CM | POA: Diagnosis not present

## 2020-06-01 DIAGNOSIS — E1142 Type 2 diabetes mellitus with diabetic polyneuropathy: Secondary | ICD-10-CM | POA: Diagnosis not present

## 2020-06-01 NOTE — Telephone Encounter (Signed)
Patient is following up. She states she contacted the neurology office and she was told that a referral is needed before she can schedule with their office. Please assist.

## 2020-06-01 NOTE — Telephone Encounter (Signed)
Order placed for neurology referral with Dr. Mikeal Hawthorne per office note on 04/28/20. Attempted to call patient, call could not be completed as dialed X3.

## 2020-06-01 NOTE — Addendum Note (Signed)
Addended by: Sherrie Mustache on: 06/01/2020 10:54 AM   Modules accepted: Orders

## 2020-06-02 ENCOUNTER — Ambulatory Visit: Payer: Medicaid Other | Admitting: Neurology

## 2020-06-02 ENCOUNTER — Telehealth: Payer: Self-pay | Admitting: Neurology

## 2020-06-02 ENCOUNTER — Other Ambulatory Visit: Payer: Self-pay

## 2020-06-02 ENCOUNTER — Encounter: Payer: Self-pay | Admitting: Neurology

## 2020-06-02 VITALS — BP 145/78 | HR 49 | Ht 75.0 in | Wt 276.5 lb

## 2020-06-02 DIAGNOSIS — G459 Transient cerebral ischemic attack, unspecified: Secondary | ICD-10-CM | POA: Diagnosis not present

## 2020-06-02 DIAGNOSIS — I482 Chronic atrial fibrillation, unspecified: Secondary | ICD-10-CM | POA: Diagnosis not present

## 2020-06-02 DIAGNOSIS — R42 Dizziness and giddiness: Secondary | ICD-10-CM | POA: Insufficient documentation

## 2020-06-02 DIAGNOSIS — E1142 Type 2 diabetes mellitus with diabetic polyneuropathy: Secondary | ICD-10-CM | POA: Diagnosis not present

## 2020-06-02 NOTE — Progress Notes (Signed)
Chief Complaint  Patient presents with  . New Patient (Initial Visit)    Orthostatic Vitals: Lying: 145/78, 49, Sitting: 127/84, 60, Standing: 160/90, 72. She is here with a friend today. Reports episodes of dizziness made worse by positional changes.   Rhonda Hickman PCP    Rhonda Perna, NP  . Cardiology    [name], MD - referring provider    HISTORICAL  Rhonda Hickman is a 64 year old female, seen in request by her cardiologist Dr. [name], and the primary care nurse practitioner Rhonda Hickman for evaluation of dizziness, related to positional change, I saw her previously in March 2020, she is accompanied by her friend at today's clinical visit.  I reviewed and summarized the referring note.  Past medical history Hypertension Diabetes, insulin-dependent, poorly controlled, A1c 8.3 Hyperlipidemia Bipolar disorder, on Depakote 1000 mg daily, Celexa 40 mg daily, Atrial fibrillation, taking Xarelto  Previous evaluation after she fell on same level ground, passed out, bumped her head on the concrete floor, also described erratic behavior during interview, she also complains of headaches I personally reviewed CT head without contrast September 2021:No acute intracranial abnormality, mild supratentorium small vessel disease  MRI of the brain in January 2021, motion degraded examination, there was no positive DWI lesion, moderate patchy T2/flair hyperdensity at cerebral white matter, consistent with chronic small vessel disease  Echocardiogram, ejection fraction 60 to 65%, no regional wall motion abnormality.  Laboratory evaluations in 2021, CMP showed elevated creatinine 1.4, glucose of 150, CBC showed hemoglobin of 11.9, mild elevated RDW of 15.6, A1c 8.9, was 11.5 in November 2019, has been persistently above 8.5 over the past couple years, lipid panel triglyceride 161, LDL 101, negative HIV, normal B12, TSH  During today's interview, she falling to  sleep during conversation, blood pressure lying down 145/78, heart rate of 49; sitting up 127/84, heart rate of 66; standing 160/90, 72 Patient is a poor historian, complains of dizziness all the time, regardless of position, it can happen lying down, sitting up and standing position,  REVIEW OF SYSTEMS: Full 14 system review of systems performed and notable only for as above All other review of systems were negative.  ALLERGIES: Allergies  Allergen Reactions  . Aspirin Nausea Only and Other (See Comments)    Causes stomach pain    HOME MEDICATIONS: Current Outpatient Medications  Medication Sig Dispense Refill  . albuterol (VENTOLIN HFA) 108 (90 Base) MCG/ACT inhaler Inhale 2 puffs into the lungs every 6 (six) hours as needed for wheezing or shortness of breath. 8 g 1  . amLODipine (NORVASC) 10 MG tablet Take 1 tablet (10 mg total) by mouth daily. 90 tablet 3  . budesonide-formoterol (SYMBICORT) 160-4.5 MCG/ACT inhaler Inhale 2 puffs into the lungs 2 (two) times daily. 1 Inhaler 3  . citalopram (CELEXA) 40 MG tablet Take 40 mg by mouth daily.    . diclofenac (VOLTAREN) 75 MG EC tablet TAKE (1) TABLET TWICE A DAY. 60 tablet 0  . divalproex (DEPAKOTE ER) 500 MG 24 hr tablet Take 500-1,000 mg by mouth See admin instructions. 500MG  in the morning and 1000mg  at bedtime    . FEROSUL 325 (65 Fe) MG tablet TAKE (1) TABLET DAILY WITH BREAKFAST. 90 tablet 0  . gabapentin (NEURONTIN) 300 MG capsule Take 1 capsule (300 mg total) by mouth 3 (three) times daily. 270 capsule 11  . GNP ULTICARE PEN NEEDLES 32G X 4 MM MISC     . HYDROcodone-acetaminophen (NORCO) 10-325 MG tablet  Take 1 tablet by mouth every 6 (six) hours as needed.    . insulin glargine (LANTUS SOLOSTAR) 100 UNIT/ML Solostar Pen Inject 10 Units into the skin daily. 15 mL 3  . insulin lispro (HUMALOG) 100 UNIT/ML cartridge 0-12 units 3 times daily before meals as per sliding scale 15 mL 3  . Insulin Syringe-Needle U-100 (ADVOCATE INSULIN  SYRINGE) 29G X 1/2" 0.3 ML MISC 161 applicators by Does not apply route 2 (two) times daily after a meal. 200 each 6  . JANUVIA 100 MG tablet TAKE 1 TABLET DAILY 30 tablet 2  . meclizine (ANTIVERT) 12.5 MG tablet Take 1 tablet (12.5 mg total) by mouth 3 (three) times daily as needed for dizziness. 30 tablet 1  . meloxicam (MOBIC) 7.5 MG tablet Take 7.5 mg by mouth daily.    . metFORMIN (GLUCOPHAGE) 1000 MG tablet Take 1 tablet (1,000 mg total) by mouth 2 (two) times daily with a meal. 180 tablet 3  . oxybutynin (DITROPAN) 5 MG tablet Take 1 tablet 2 (two) times daily. 60 tablet 3  . PEG-KCl-NaCl-NaSulf-Na Asc-C (PLENVU) 140 g SOLR Take 140 g by mouth as directed. Manufacturer's coupon Universal coupon code:BIN: P2366821; GROUP: [id number]; PCN: [id number]; ID: [id number]; PAY NO MORE $50 1 each 0  . phenazopyridine (PYRIDIUM) 100 MG tablet Take 1 tablet (100 mg total) by mouth 3 (three) times daily as needed for pain. 10 tablet 0  . sulfamethoxazole-trimethoprim (BACTRIM DS) 800-160 MG tablet Take 1 tablet by mouth 2 (two) times daily. 20 tablet 0  . Vitamin D, Ergocalciferol, (DRISDOL) 1.25 MG (50000 UNIT) CAPS capsule Take 1 capsule (50,000 Units total) by mouth every 7 (seven) days. 32 capsule 0  . XARELTO 20 MG TABS tablet TAKE 1 TABLET WITH SUPPER 30 tablet 1  . metoprolol tartrate (LOPRESSOR) 25 MG tablet Take 1 tablet (25 mg total) by mouth 2 (two) times daily. 180 tablet 3  . rosuvastatin (CRESTOR) 10 MG tablet Take 1 tablet (10 mg total) by mouth daily. 90 tablet 3   No current facility-administered medications for this visit.    PAST MEDICAL HISTORY: Past Medical History:  Diagnosis Date  . Anemia   . Anxiety   . Asthma   . Atrial fibrillation ([place])   . Bipolar 1 disorder ([place])   . Bulging lumbar disc   . Chronic pain of left knee   . COPD (chronic obstructive pulmonary disease) ([place])   . CVA (cerebral vascular accident) ([place])   . Diabetes mellitus   . Neuropathy   . TIA (transient  ischemic attack)   . Vertigo     PAST SURGICAL HISTORY: Past Surgical History:  Procedure Laterality Date  . ABDOMINAL HYSTERECTOMY    . CARPAL TUNNEL RELEASE Right   . CHOLECYSTECTOMY    . KNEE ARTHROSCOPY WITH MEDIAL MENISECTOMY Right 04/14/2016   Procedure: KNEE ARTHROSCOPY WITH MEDIAL AND LATERAL MENISECTOMY, MICROFRACTURE REPAIR;  Surgeon: Carole Civil, MD;  Location: [facility];  Service: Orthopedics;  Laterality: Right;  lateral menisectomy - needs crutch training    FAMILY HISTORY: Family History  Problem Relation Age of Onset  . Diabetes Mother   . Heart attack Father     SOCIAL HISTORY: Social History   Socioeconomic History  . Marital status: Single    Spouse name: Not on file  . Number of children: 0  . Years of education: 80  . Highest education level: High school graduate  Occupational History  . Occupation: Unemployed  Tobacco Use  .  Smoking status: Current Some Day Smoker    Packs/day: 0.50    Types: Cigarettes  . Smokeless tobacco: Never Used  . Tobacco comment: tobacco info given  Vaping Use  . Vaping Use: Some days  Substance and Sexual Activity  . Alcohol use: No  . Drug use: No  . Sexual activity: Never  Other Topics Concern  . Not on file  Social History Narrative   Lives at home alone.   Right-handed.   No caffeine use.   Social Determinants of Health   Financial Resource Strain:   . Difficulty of Paying Living Expenses: Not on file  Food Insecurity:   . Worried About Charity fundraiser in the Last Year: Not on file  . Ran Out of Food in the Last Year: Not on file  Transportation Needs:   . Lack of Transportation (Medical): Not on file  . Lack of Transportation (Non-Medical): Not on file  Physical Activity:   . Days of Exercise per Week: Not on file  . Minutes of Exercise per Session: Not on file  Stress:   . Feeling of Stress : Not on file  Social Connections:   . Frequency of Communication with Friends and Family: Not on  file  . Frequency of Social Gatherings with Friends and Family: Not on file  . Attends Religious Services: Not on file  . Active Member of Clubs or Organizations: Not on file  . Attends Archivist Meetings: Not on file  . Marital Status: Not on file  Intimate Partner Violence:   . Fear of Current or Ex-Partner: Not on file  . Emotionally Abused: Not on file  . Physically Abused: Not on file  . Sexually Abused: Not on file     PHYSICAL EXAM   Vitals:   06/02/20 1409  BP: (!) 145/78  Pulse: (!) 49  Weight: 276 lb 8 oz (125.4 kg)  Height: 6\' 3"  (1.905 m)   blood pressure lying down 145/78, heart rate of 49; sitting up 127/84, heart rate of 66; standing 160/90, 72  Body mass index is 34.56 kg/m.  PHYSICAL EXAMNIATION:  Gen: NAD, conversant, well nourised, well groomed                     Cardiovascular: Regular rate rhythm, no peripheral edema, warm, nontender. Eyes: Conjunctivae clear without exudates or hemorrhage Neck: Supple, no carotid bruits. Pulmonary: Clear to auscultation bilaterally   NEUROLOGICAL EXAM:  MENTAL STATUS: Speech/cognition: Fall into sleep during conversation, mildly slurred speech CRANIAL NERVES: CN II: Visual fields are full to confrontation. Pupils are round equal and briskly reactive to light. CN III, IV, VI: extraocular movement are normal. No ptosis. CN V: Facial sensation is intact to light touch CN VII: Face is symmetric with normal eye closure  CN VIII: Hearing is normal to causal conversation. CN IX, X: Phonation is normal. CN XI: Head turning and shoulder shrug are intact  MOTOR: There is no pronator drift of out-stretched arms. Muscle bulk and tone are normal. Muscle strength is normal.  REFLEXES: Reflexes are 2+ and symmetric at the biceps, triceps, knees, and ankles. Plantar responses are flexor.  SENSORY: Intact to light touch, pinprick and vibratory sensation are intact in fingers and toes.  COORDINATION: There is  no trunk or limb dysmetria noted.  GAIT/STANCE: Need push-up to get up from seated position, big body habitus, unsteady   DIAGNOSTIC DATA (LABS, IMAGING, TESTING) - I reviewed patient records, labs, notes, testing  and imaging myself where available.   ASSESSMENT AND PLAN  Rhonda Hickman is a 64 y.o. female   Unsteady gait, dizziness,  Patient was noted to be very sleepy during today's interview,  Previous MRI of the brain, CT scan in the past showed no significant structural abnormality  Laboratory evaluation including Depakote level, drug screen to rule out toxicity  Repeat MRI of the brain  Advised patient increase water intake,   Marcial Pacas, M.D. Ph.D.  [facility] [address], [place], [place] [address] Ph: [phone] Fax: [phone]  CC:  [name], [place],  [place] [address]  Rhonda Perna, NP

## 2020-06-02 NOTE — Telephone Encounter (Signed)
medicaid order sent to GI. No auth they will reach out to the patient to schedule.  

## 2020-06-03 DIAGNOSIS — E1142 Type 2 diabetes mellitus with diabetic polyneuropathy: Secondary | ICD-10-CM | POA: Diagnosis not present

## 2020-06-05 DIAGNOSIS — E1142 Type 2 diabetes mellitus with diabetic polyneuropathy: Secondary | ICD-10-CM | POA: Diagnosis not present

## 2020-06-06 DIAGNOSIS — E1142 Type 2 diabetes mellitus with diabetic polyneuropathy: Secondary | ICD-10-CM | POA: Diagnosis not present

## 2020-06-06 LAB — DRUG SCREEN 10 W/CONF, SERUM
Amphetamines, IA: NEGATIVE ng/mL
Barbiturates, IA: NEGATIVE ug/mL
Benzodiazepines, IA: NEGATIVE ng/mL
Cocaine & Metabolite, IA: NEGATIVE ng/mL
Methadone, IA: NEGATIVE ng/mL
Opiates, IA: NEGATIVE ng/mL
Oxycodones, IA: NEGATIVE ng/mL
Phencyclidine, IA: NEGATIVE ng/mL
Propoxyphene, IA: NEGATIVE ng/mL
THC(Marijuana) Metabolite, IA: NEGATIVE ng/mL

## 2020-06-06 LAB — VITAMIN D 25 HYDROXY (VIT D DEFICIENCY, FRACTURES): Vit D, 25-Hydroxy: 59.9 ng/mL (ref 30.0–100.0)

## 2020-06-06 LAB — VITAMIN B12: Vitamin B-12: 460 pg/mL (ref 232–1245)

## 2020-06-06 LAB — MULTIPLE MYELOMA PANEL, SERUM
Albumin SerPl Elph-Mcnc: 3.5 g/dL (ref 2.9–4.4)
Albumin/Glob SerPl: 0.9 (ref 0.7–1.7)
Alpha 1: 0.2 g/dL (ref 0.0–0.4)
Alpha2 Glob SerPl Elph-Mcnc: 0.8 g/dL (ref 0.4–1.0)
B-Globulin SerPl Elph-Mcnc: 1.3 g/dL (ref 0.7–1.3)
Gamma Glob SerPl Elph-Mcnc: 1.7 g/dL (ref 0.4–1.8)
Globulin, Total: 4 g/dL — ABNORMAL HIGH (ref 2.2–3.9)
IgA/Immunoglobulin A, Serum: 352 mg/dL (ref 87–352)
IgG (Immunoglobin G), Serum: 1886 mg/dL — ABNORMAL HIGH (ref 586–1602)
IgM (Immunoglobulin M), Srm: 100 mg/dL (ref 26–217)
Total Protein: 7.5 g/dL (ref 6.0–8.5)

## 2020-06-06 LAB — C-REACTIVE PROTEIN: CRP: 3 mg/L (ref 0–10)

## 2020-06-06 LAB — VALPROIC ACID LEVEL: Valproic Acid Lvl: 49 ug/mL — ABNORMAL LOW (ref 50–100)

## 2020-06-06 LAB — SEDIMENTATION RATE: Sed Rate: 28 mm/hr (ref 0–40)

## 2020-06-06 LAB — RPR: RPR Ser Ql: NONREACTIVE

## 2020-06-06 LAB — ANA W/REFLEX IF POSITIVE: Anti Nuclear Antibody (ANA): NEGATIVE

## 2020-06-07 DIAGNOSIS — E1142 Type 2 diabetes mellitus with diabetic polyneuropathy: Secondary | ICD-10-CM | POA: Diagnosis not present

## 2020-06-08 ENCOUNTER — Ambulatory Visit (INDEPENDENT_AMBULATORY_CARE_PROVIDER_SITE_OTHER): Payer: Medicaid Other | Admitting: Primary Care

## 2020-06-08 ENCOUNTER — Encounter (INDEPENDENT_AMBULATORY_CARE_PROVIDER_SITE_OTHER): Payer: Self-pay | Admitting: Primary Care

## 2020-06-08 ENCOUNTER — Telehealth: Payer: Self-pay | Admitting: Neurology

## 2020-06-08 ENCOUNTER — Other Ambulatory Visit: Payer: Self-pay

## 2020-06-08 VITALS — BP 105/69 | HR 68 | Temp 97.5°F | Ht 75.0 in | Wt 279.2 lb

## 2020-06-08 DIAGNOSIS — Z7689 Persons encountering health services in other specified circumstances: Secondary | ICD-10-CM | POA: Diagnosis not present

## 2020-06-08 DIAGNOSIS — E782 Mixed hyperlipidemia: Secondary | ICD-10-CM

## 2020-06-08 DIAGNOSIS — Z794 Long term (current) use of insulin: Secondary | ICD-10-CM | POA: Diagnosis not present

## 2020-06-08 DIAGNOSIS — E1100 Type 2 diabetes mellitus with hyperosmolarity without nonketotic hyperglycemic-hyperosmolar coma (NKHHC): Secondary | ICD-10-CM | POA: Diagnosis not present

## 2020-06-08 DIAGNOSIS — W19XXXD Unspecified fall, subsequent encounter: Secondary | ICD-10-CM | POA: Diagnosis not present

## 2020-06-08 DIAGNOSIS — R059 Cough, unspecified: Secondary | ICD-10-CM | POA: Diagnosis not present

## 2020-06-08 DIAGNOSIS — I10 Essential (primary) hypertension: Secondary | ICD-10-CM

## 2020-06-08 DIAGNOSIS — E1142 Type 2 diabetes mellitus with diabetic polyneuropathy: Secondary | ICD-10-CM | POA: Diagnosis not present

## 2020-06-08 LAB — GLUCOSE, POCT (MANUAL RESULT ENTRY): POC Glucose: 218 mg/dl — AB (ref 70–99)

## 2020-06-08 MED ORDER — FLUTICASONE PROPIONATE 50 MCG/ACT NA SUSP: 2.0000 | Freq: Every day | NASAL | 6 refills | Status: DC

## 2020-06-08 MED ORDER — DM-GUAIFENESIN ER 30-600 MG PO TB12: 1.0000 | ORAL_TABLET | Freq: Two times a day (BID) | ORAL | 3 refills | Status: DC | PRN

## 2020-06-08 NOTE — Telephone Encounter (Signed)
I spoke to the patient and provided her with the lab results. 

## 2020-06-08 NOTE — Telephone Encounter (Signed)
Please call patient, lab evaluation showed no significant abnormalities.

## 2020-06-08 NOTE — Patient Instructions (Signed)

## 2020-06-08 NOTE — Progress Notes (Signed)
Established Patient Office Visit  Subjective:  Patient ID: Rhonda Hickman, female    DOB: 1956-02-02  Age: 64 y.o. MRN: [id number]  CC:  Chief Complaint  Patient presents with  . Follow-up    diabetes     HPI Rhonda Hickman is a 64 year old obese female who presents with a rolling walker she states she is using it now because she has been following down several times without injuries. Continues to has aphasia. She is in for diabetic management. Hypoglycemic episodes:no, Polydipsia/polyuria: yes, Visual disturbance: no Chest pain: no and Paresthesias: yes  Past Medical History:  Diagnosis Date  . Anemia   . Anxiety   . Asthma   . Atrial fibrillation ([place])   . Bipolar 1 disorder ([place])   . Bulging lumbar disc   . Chronic pain of left knee   . COPD (chronic obstructive pulmonary disease) ([place])   . CVA (cerebral vascular accident) ([place])   . Diabetes mellitus   . Neuropathy   . TIA (transient ischemic attack)   . Vertigo     Past Surgical History:  Procedure Laterality Date  . ABDOMINAL HYSTERECTOMY    . CARPAL TUNNEL RELEASE Right   . CHOLECYSTECTOMY    . KNEE ARTHROSCOPY WITH MEDIAL MENISECTOMY Right 04/14/2016   Procedure: KNEE ARTHROSCOPY WITH MEDIAL AND LATERAL MENISECTOMY, MICROFRACTURE REPAIR;  Surgeon: Carole Civil, MD;  Location: [facility];  Service: Orthopedics;  Laterality: Right;  lateral menisectomy - needs crutch training    Family History  Problem Relation Age of Onset  . Diabetes Mother   . Heart attack Father     Social History   Socioeconomic History  . Marital status: Single    Spouse name: Not on file  . Number of children: 0  . Years of education: 24  . Highest education level: High school graduate  Occupational History  . Occupation: Unemployed  Tobacco Use  . Smoking status: Current Some Day Smoker    Packs/day: 0.50    Types: Cigarettes  . Smokeless tobacco: Never Used  . Tobacco comment: tobacco info given  Vaping Use  .  Vaping Use: Some days  Substance and Sexual Activity  . Alcohol use: No  . Drug use: No  . Sexual activity: Never  Other Topics Concern  . Not on file  Social History Narrative   Lives at home alone.   Right-handed.   No caffeine use.   Social Determinants of Health   Financial Resource Strain:   . Difficulty of Paying Living Expenses: Not on file  Food Insecurity:   . Worried About Charity fundraiser in the Last Year: Not on file  . Ran Out of Food in the Last Year: Not on file  Transportation Needs:   . Lack of Transportation (Medical): Not on file  . Lack of Transportation (Non-Medical): Not on file  Physical Activity:   . Days of Exercise per Week: Not on file  . Minutes of Exercise per Session: Not on file  Stress:   . Feeling of Stress : Not on file  Social Connections:   . Frequency of Communication with Friends and Family: Not on file  . Frequency of Social Gatherings with Friends and Family: Not on file  . Attends Religious Services: Not on file  . Active Member of Clubs or Organizations: Not on file  . Attends Archivist Meetings: Not on file  . Marital Status: Not on file  Intimate Partner Violence:   .  Fear of Current or Ex-Partner: Not on file  . Emotionally Abused: Not on file  . Physically Abused: Not on file  . Sexually Abused: Not on file    Outpatient Medications Prior to Visit  Medication Sig Dispense Refill  . albuterol (VENTOLIN HFA) 108 (90 Base) MCG/ACT inhaler Inhale 2 puffs into the lungs every 6 (six) hours as needed for wheezing or shortness of breath. 8 g 1  . amLODipine (NORVASC) 10 MG tablet Take 1 tablet (10 mg total) by mouth daily. 90 tablet 3  . budesonide-formoterol (SYMBICORT) 160-4.5 MCG/ACT inhaler Inhale 2 puffs into the lungs 2 (two) times daily. 1 Inhaler 3  . citalopram (CELEXA) 40 MG tablet Take 40 mg by mouth daily.    . diclofenac (VOLTAREN) 75 MG EC tablet TAKE (1) TABLET TWICE A DAY. 60 tablet 0  . divalproex  (DEPAKOTE ER) 500 MG 24 hr tablet Take 500-1,000 mg by mouth See admin instructions. 500MG  in the morning and 1000mg  at bedtime    . FEROSUL 325 (65 Fe) MG tablet TAKE (1) TABLET DAILY WITH BREAKFAST. 90 tablet 0  . gabapentin (NEURONTIN) 300 MG capsule Take 1 capsule (300 mg total) by mouth 3 (three) times daily. 270 capsule 11  . GNP ULTICARE PEN NEEDLES 32G X 4 MM MISC     . HYDROcodone-acetaminophen (NORCO) 10-325 MG tablet Take 1 tablet by mouth every 6 (six) hours as needed.    . insulin glargine (LANTUS SOLOSTAR) 100 UNIT/ML Solostar Pen Inject 10 Units into the skin daily. 15 mL 3  . insulin lispro (HUMALOG) 100 UNIT/ML cartridge 0-12 units 3 times daily before meals as per sliding scale 15 mL 3  . Insulin Syringe-Needle U-100 (ADVOCATE INSULIN SYRINGE) 29G X 1/2" 0.3 ML MISC 498 applicators by Does not apply route 2 (two) times daily after a meal. 200 each 6  . JANUVIA 100 MG tablet TAKE 1 TABLET DAILY 30 tablet 2  . meclizine (ANTIVERT) 12.5 MG tablet Take 1 tablet (12.5 mg total) by mouth 3 (three) times daily as needed for dizziness. 30 tablet 1  . meloxicam (MOBIC) 7.5 MG tablet Take 7.5 mg by mouth daily.    . metFORMIN (GLUCOPHAGE) 1000 MG tablet Take 1 tablet (1,000 mg total) by mouth 2 (two) times daily with a meal. 180 tablet 3  . oxybutynin (DITROPAN) 5 MG tablet Take 1 tablet 2 (two) times daily. 60 tablet 3  . PEG-KCl-NaCl-NaSulf-Na Asc-C (PLENVU) 140 g SOLR Take 140 g by mouth as directed. Manufacturer's coupon Universal coupon code:BIN: P2366821; GROUP: [id number]; PCN: [id number]; ID: [id number]; PAY NO MORE $50 1 each 0  . phenazopyridine (PYRIDIUM) 100 MG tablet Take 1 tablet (100 mg total) by mouth 3 (three) times daily as needed for pain. 10 tablet 0  . sulfamethoxazole-trimethoprim (BACTRIM DS) 800-160 MG tablet Take 1 tablet by mouth 2 (two) times daily. 20 tablet 0  . Vitamin D, Ergocalciferol, (DRISDOL) 1.25 MG (50000 UNIT) CAPS capsule Take 1 capsule (50,000 Units total) by  mouth every 7 (seven) days. 32 capsule 0  . XARELTO 20 MG TABS tablet TAKE 1 TABLET WITH SUPPER 30 tablet 1  . metoprolol tartrate (LOPRESSOR) 25 MG tablet Take 1 tablet (25 mg total) by mouth 2 (two) times daily. 180 tablet 3  . rosuvastatin (CRESTOR) 10 MG tablet Take 1 tablet (10 mg total) by mouth daily. 90 tablet 3   No facility-administered medications prior to visit.    Allergies  Allergen Reactions  . Aspirin Nausea Only  and Other (See Comments)    Causes stomach pain    ROS Review of Systems  Musculoskeletal: Positive for gait problem.  All other systems reviewed and are negative.     Objective:    Physical Exam Vitals reviewed.  Constitutional:      Appearance: She is obese.  HENT:     Head: Normocephalic.     Nose: Nose normal.  Cardiovascular:     Rate and Rhythm: Normal rate and regular rhythm.  Pulmonary:     Effort: Pulmonary effort is normal.     Breath sounds: Normal breath sounds.  Abdominal:     General: Bowel sounds are normal. There is distension.  Musculoskeletal:        General: Normal range of motion.     Cervical back: Normal range of motion and neck supple.  Skin:    General: Skin is warm and dry.  Neurological:     Mental Status: She is alert and oriented to person, place, and time.  Psychiatric:        Mood and Affect: Mood normal.        Behavior: Behavior normal.        Thought Content: Thought content normal.        Judgment: Judgment normal.     BP 105/69 (BP Location: Left Arm, Patient Position: Sitting, Cuff Size: Large)   Pulse 68   Temp (!) 97.5 F (36.4 C) (Temporal)   Ht 6\' 3"  (1.905 m)   Wt 279 lb 3.2 oz (126.6 kg)   SpO2 97%   BMI 34.90 kg/m  Wt Readings from Last 3 Encounters:  06/08/20 279 lb 3.2 oz (126.6 kg)  06/02/20 276 lb 8 oz (125.4 kg)  05/04/20 283 lb 9.6 oz (128.6 kg)     Health Maintenance Due  Topic Date Due  . Hepatitis C Screening  Never done  . PNEUMOCOCCAL POLYSACCHARIDE VACCINE AGE 73-64  HIGH RISK  Never done  . OPHTHALMOLOGY EXAM  Never done  . PAP SMEAR-Modifier  Never done  . MAMMOGRAM  Never done  . COLONOSCOPY  Never done    There are no preventive care reminders to display for this patient.  Lab Results  Component Value Date   TSH 0.600 03/17/2020   Lab Results  Component Value Date   WBC 10.2 03/17/2020   HGB 11.5 (L) 03/17/2020   HCT 35.9 (L) 03/17/2020   MCV 90.2 03/17/2020   PLT 206 03/17/2020   Lab Results  Component Value Date   NA 139 03/17/2020   K 4.2 03/17/2020   CO2 23 03/17/2020   GLUCOSE 109 (H) 03/17/2020   BUN 15 03/17/2020   CREATININE 0.91 03/17/2020   BILITOT 0.6 03/17/2020   ALKPHOS 38 03/17/2020   AST 15 03/17/2020   ALT 15 03/17/2020   PROT 7.5 06/02/2020   ALBUMIN 3.6 03/17/2020   CALCIUM 8.8 (L) 03/17/2020   ANIONGAP 13 03/17/2020   Lab Results  Component Value Date   CHOL 116 06/08/2020   Lab Results  Component Value Date   HDL 26 (L) 06/08/2020   Lab Results  Component Value Date   LDLCALC 67 06/08/2020   Lab Results  Component Value Date   TRIG 125 06/08/2020   Lab Results  Component Value Date   CHOLHDL 4.5 (H) 06/08/2020   Lab Results  Component Value Date   HGBA1C 10.5 (H) 06/08/2020      Assessment & Plan:  Trish was seen today for follow-up.  Diagnoses and all orders for this visit:  Type 2 diabetes mellitus with 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, with long-term current use of insulin (HCC) Discussed Complications from uncontrolled diabetes -diabetic retinopathy leading to blindness, diabetic  nephropathy leading to dialysis, decrease in circulation decrease in sores or wound healing which may lead to amputations and increase of heart attack and stroke A1c increased refer to clinical pharmacist  -     Glucose (CBG) -     Hemoglobin A1c  Primary hypertension Counseled on blood pressure goal of less than 130/80, she did not take medication today due to fasting. She is trying to maintain a  low-sodium, DASH diet, medication compliance, . Due to frequent falls , she has been falling and spells where she is having syncope episodes . Discussed medication compliance, adverse effects.  Fall, subsequent encounter Due to frequent falls , she has been falling and spells where she is having syncope episodes . Followed by neurology   Mixed hyperlipidemia Increased risk for another CVA elevated A1C, obese and lipids. Bp controlled  -     Lipid Panel    Meds ordered this encounter  Medications  . fluticasone (FLONASE) 50 MCG/ACT nasal spray    Sig: Place 2 sprays into both nostrils daily.    Dispense:  16 g    Refill:  6  . dextromethorphan-guaiFENesin (MUCINEX DM) 30-600 MG 12hr tablet    Sig: Take 1 tablet by mouth 2 (two) times daily as needed for cough.    Dispense:  60 tablet    Refill:  3    Follow-up: Return in about 3 months (around 09/07/2020), or DM/ frequent fails.    [name], NP

## 2020-06-09 ENCOUNTER — Ambulatory Visit (INDEPENDENT_AMBULATORY_CARE_PROVIDER_SITE_OTHER): Payer: Medicaid Other | Admitting: Primary Care

## 2020-06-09 ENCOUNTER — Other Ambulatory Visit (INDEPENDENT_AMBULATORY_CARE_PROVIDER_SITE_OTHER): Payer: Self-pay | Admitting: Primary Care

## 2020-06-09 DIAGNOSIS — E1142 Type 2 diabetes mellitus with diabetic polyneuropathy: Secondary | ICD-10-CM | POA: Diagnosis not present

## 2020-06-09 LAB — HEMOGLOBIN A1C
Est. average glucose Bld gHb Est-mCnc: 255 mg/dL
Hgb A1c MFr Bld: 10.5 % — ABNORMAL HIGH (ref 4.8–5.6)

## 2020-06-09 LAB — LIPID PANEL
Chol/HDL Ratio: 4.5 ratio — ABNORMAL HIGH (ref 0.0–4.4)
Cholesterol, Total: 116 mg/dL (ref 100–199)
HDL: 26 mg/dL — ABNORMAL LOW (ref >39)
LDL Chol Calc (NIH): 67 mg/dL (ref 0–99)
Triglycerides: 125 mg/dL (ref 0–149)
VLDL Cholesterol Cal: 23 mg/dL (ref 5–40)

## 2020-06-10 ENCOUNTER — Encounter: Payer: Medicaid Other | Admitting: Gastroenterology

## 2020-06-10 ENCOUNTER — Telehealth (INDEPENDENT_AMBULATORY_CARE_PROVIDER_SITE_OTHER): Payer: Self-pay

## 2020-06-10 DIAGNOSIS — E1142 Type 2 diabetes mellitus with diabetic polyneuropathy: Secondary | ICD-10-CM | POA: Diagnosis not present

## 2020-06-10 NOTE — Telephone Encounter (Signed)
-----   Message from [name], NP sent at 06/09/2020  3:31 PM EST ----- Refer to [facility] for DM management

## 2020-06-10 NOTE — Telephone Encounter (Signed)
Patient is aware that A1c has increased from three months ago. Referred to clinical pharmacist for diabetes management. She has appt for 06/18/20. Nat Christen, CMA

## 2020-06-11 DIAGNOSIS — E1142 Type 2 diabetes mellitus with diabetic polyneuropathy: Secondary | ICD-10-CM | POA: Diagnosis not present

## 2020-06-12 DIAGNOSIS — E1142 Type 2 diabetes mellitus with diabetic polyneuropathy: Secondary | ICD-10-CM | POA: Diagnosis not present

## 2020-06-13 DIAGNOSIS — E1142 Type 2 diabetes mellitus with diabetic polyneuropathy: Secondary | ICD-10-CM | POA: Diagnosis not present

## 2020-06-14 DIAGNOSIS — E1142 Type 2 diabetes mellitus with diabetic polyneuropathy: Secondary | ICD-10-CM | POA: Diagnosis not present

## 2020-06-15 DIAGNOSIS — E1142 Type 2 diabetes mellitus with diabetic polyneuropathy: Secondary | ICD-10-CM | POA: Diagnosis not present

## 2020-06-16 ENCOUNTER — Other Ambulatory Visit (INDEPENDENT_AMBULATORY_CARE_PROVIDER_SITE_OTHER): Payer: Self-pay | Admitting: Primary Care

## 2020-06-16 ENCOUNTER — Ambulatory Visit
Admission: RE | Admit: 2020-06-16 | Discharge: 2020-06-16 | Disposition: A | Discharge status: Home / Self Care | Payer: Medicaid Other | Source: Ambulatory Visit | Attending: Neurology | Admitting: Neurology

## 2020-06-16 ENCOUNTER — Other Ambulatory Visit: Payer: Self-pay

## 2020-06-16 DIAGNOSIS — I482 Chronic atrial fibrillation, unspecified: Secondary | ICD-10-CM

## 2020-06-16 DIAGNOSIS — G459 Transient cerebral ischemic attack, unspecified: Secondary | ICD-10-CM

## 2020-06-16 DIAGNOSIS — R42 Dizziness and giddiness: Secondary | ICD-10-CM

## 2020-06-16 DIAGNOSIS — E1142 Type 2 diabetes mellitus with diabetic polyneuropathy: Secondary | ICD-10-CM

## 2020-06-16 DIAGNOSIS — Z7689 Persons encountering health services in other specified circumstances: Secondary | ICD-10-CM | POA: Diagnosis not present

## 2020-06-16 MED ORDER — ADVOCATE INSULIN SYRINGE 29G X 1/2" 0.3 ML MISC
6 refills | Status: DC
Start: 2020-06-16 — End: 2020-09-07

## 2020-06-16 NOTE — Telephone Encounter (Signed)
Copied from [place] [id number]. Topic: Quick Communication - Rx Refill/Question >> Jun 15, 2020 10:23 AM Hinda Lenis D wrote: PT using 5 needles per day requesting to  change her prescription / Insulin Syringe-Needle U-100 (ADVOCATE INSULIN SYRINGE) 29G X 1/2" 0.3 ML MISC [[id number]] / Pharmacy fax the request already / please advise  [place] [place], [place] [address]  Phone:  [phone] Fax:  [phone] >> Jun 16, 2020  2:25 PM Celene Kras wrote: Pt calling again regarding these needles. Pt states that she has not been able to have her insulin for two days. Please advise.

## 2020-06-17 DIAGNOSIS — E1142 Type 2 diabetes mellitus with diabetic polyneuropathy: Secondary | ICD-10-CM | POA: Diagnosis not present

## 2020-06-18 ENCOUNTER — Ambulatory Visit: Payer: Medicaid Other | Admitting: Pharmacist

## 2020-06-18 DIAGNOSIS — E1142 Type 2 diabetes mellitus with diabetic polyneuropathy: Secondary | ICD-10-CM | POA: Diagnosis not present

## 2020-06-18 DIAGNOSIS — Z7689 Persons encountering health services in other specified circumstances: Secondary | ICD-10-CM | POA: Diagnosis not present

## 2020-06-19 DIAGNOSIS — E1142 Type 2 diabetes mellitus with diabetic polyneuropathy: Secondary | ICD-10-CM | POA: Diagnosis not present

## 2020-06-20 DIAGNOSIS — E1142 Type 2 diabetes mellitus with diabetic polyneuropathy: Secondary | ICD-10-CM | POA: Diagnosis not present

## 2020-06-21 DIAGNOSIS — E1142 Type 2 diabetes mellitus with diabetic polyneuropathy: Secondary | ICD-10-CM | POA: Diagnosis not present

## 2020-06-22 ENCOUNTER — Telehealth: Payer: Self-pay | Admitting: Neurology

## 2020-06-22 DIAGNOSIS — E1142 Type 2 diabetes mellitus with diabetic polyneuropathy: Secondary | ICD-10-CM | POA: Diagnosis not present

## 2020-06-22 NOTE — Telephone Encounter (Signed)
I spoke to the patient and provided her with the MRI brain results below. She verbalized understanding.

## 2020-06-22 NOTE — Telephone Encounter (Signed)
MRI brain (without) demonstrating: - Mild chronic small vessel ischemic disease.  - No acute findings.   Please call patient, MRI of the brain showed mild age-related changes, there was no acute abnormalities.

## 2020-06-23 ENCOUNTER — Telehealth: Payer: Self-pay | Admitting: *Deleted

## 2020-06-23 DIAGNOSIS — E1142 Type 2 diabetes mellitus with diabetic polyneuropathy: Secondary | ICD-10-CM | POA: Diagnosis not present

## 2020-06-23 NOTE — Telephone Encounter (Signed)
Called patient to discuss monitor results.  She reports having chest pain intermittently x 1 month.  Denies SOB/palpitation, unsure if it radiates, unsure if it gets worse with exertion.  States she goes to lay down and when she wakes up pain is gone.  States pain sometimes wakes her up as well.  She reports dizziness, ongoing issue, followed by neuro as well.   Unable to get more information from patient, poor historian.  Scheduled appt with Dr. Gardiner Rhyme to discuss further.  Advised if symptoms return or worsen to proceed to ER for acute evaluation.  Patient verbalized understanding.

## 2020-06-24 DIAGNOSIS — E1142 Type 2 diabetes mellitus with diabetic polyneuropathy: Secondary | ICD-10-CM | POA: Diagnosis not present

## 2020-06-25 DIAGNOSIS — E1142 Type 2 diabetes mellitus with diabetic polyneuropathy: Secondary | ICD-10-CM | POA: Diagnosis not present

## 2020-06-26 DIAGNOSIS — Z2821 Immunization not carried out because of patient refusal: Secondary | ICD-10-CM | POA: Diagnosis not present

## 2020-06-26 DIAGNOSIS — G8929 Other chronic pain: Secondary | ICD-10-CM | POA: Diagnosis not present

## 2020-06-26 DIAGNOSIS — E1142 Type 2 diabetes mellitus with diabetic polyneuropathy: Secondary | ICD-10-CM | POA: Diagnosis not present

## 2020-06-26 DIAGNOSIS — M545 Low back pain, unspecified: Secondary | ICD-10-CM | POA: Diagnosis not present

## 2020-06-26 DIAGNOSIS — E119 Type 2 diabetes mellitus without complications: Secondary | ICD-10-CM | POA: Diagnosis not present

## 2020-06-26 DIAGNOSIS — Z79899 Other long term (current) drug therapy: Secondary | ICD-10-CM | POA: Diagnosis not present

## 2020-06-26 DIAGNOSIS — Z7689 Persons encountering health services in other specified circumstances: Secondary | ICD-10-CM | POA: Diagnosis not present

## 2020-06-27 DIAGNOSIS — E1142 Type 2 diabetes mellitus with diabetic polyneuropathy: Secondary | ICD-10-CM | POA: Diagnosis not present

## 2020-06-28 DIAGNOSIS — E1142 Type 2 diabetes mellitus with diabetic polyneuropathy: Secondary | ICD-10-CM | POA: Diagnosis not present

## 2020-06-29 DIAGNOSIS — E1142 Type 2 diabetes mellitus with diabetic polyneuropathy: Secondary | ICD-10-CM | POA: Diagnosis not present

## 2020-06-29 DIAGNOSIS — F329 Major depressive disorder, single episode, unspecified: Secondary | ICD-10-CM | POA: Diagnosis not present

## 2020-06-30 DIAGNOSIS — E1142 Type 2 diabetes mellitus with diabetic polyneuropathy: Secondary | ICD-10-CM | POA: Diagnosis not present

## 2020-07-01 DIAGNOSIS — E1142 Type 2 diabetes mellitus with diabetic polyneuropathy: Secondary | ICD-10-CM | POA: Diagnosis not present

## 2020-07-02 DIAGNOSIS — E1142 Type 2 diabetes mellitus with diabetic polyneuropathy: Secondary | ICD-10-CM | POA: Diagnosis not present

## 2020-07-04 DIAGNOSIS — E1142 Type 2 diabetes mellitus with diabetic polyneuropathy: Secondary | ICD-10-CM | POA: Diagnosis not present

## 2020-07-05 DIAGNOSIS — E1142 Type 2 diabetes mellitus with diabetic polyneuropathy: Secondary | ICD-10-CM | POA: Diagnosis not present

## 2020-07-05 NOTE — Progress Notes (Deleted)
Cardiology Office Note:    Date:  07/05/2020   ID:  Rhonda Hickman, DOB February 06, 1956, MRN [id number]  PCP:  [name], NP  Cardiologist:  No primary care provider on file.  Electrophysiologist:  None   Referring MD: [name], NP   No chief complaint on file.   History of Present Illness:    Rhonda Hickman is a 64 y.o. female with a hx of TIA, atrial fibrillation, BPD, COPD, T2DM, asthma who presents for follow-up.  She was referred by [name], NP for evaluation of atrial fibrillation, initially seen on 12/16/2019.  She reports that she was in the hospital for 5 days in [place] in May 2021.  States that she was admitted with AKI and UTI and found to have atrial fibrillation.  Started on Xarelto.  States that she has been taking this without any bleeding issues.  Reports she has had intermittent palpitations over the last month since her discharge from the hospital.  She reports a history of stabbing chest pain in the center of her chest but has not had any recently, does however chest pressure with exertion.  States that going for walks causes her to have exertional chest pressure.  She is smoking about half a pack per day, down from peak of 2 packs/day.  States that both her mother and father had MIs but unsure of age.  TTE on 08/03/2019 showed LVEF 60-65%, normal RV function, normal biatrial size, no significant valvular disease.  Lexiscan Myoview on 01/03/2020 showed low risk stress test with breast attenuation but no ischemia, LVEF 48%.  Carotid duplex on 01/14/2020 showed bilateral 1 to 39% stenosis.  Zio patch x14 days on 01/29/2020 showed 1% atrial flutter burden, average heart rate 86 bpm.  Zio patch x3 days on 05/25/2020 showed no significant arrhythmias.  Since last clinic visit,   she was admitted to [facility] from 9/7 through 03/18/2020 after a witnessed syncopal episode.  She was found to have acute toxic encephalopathy secondary to mild  benzodiazepine overdose, that was not intentional.  Head CT unremarkable.  Urine toxicology unremarkable aside from benzodiazepines.  She was monitored and had improvement in her mentation. She reports that since that time she has had persistent dizzy spells. Occurs multiple times a day. She does not know what causes it. Does state that changing positions can seem to cause it but can also occur just with lying down. Feels like room is spinning. Reports chest pain has improved. Continues to have palpitations. Continues to smoke 0.5 packs/day.    Past Medical History:  Diagnosis Date  . Anemia   . Anxiety   . Asthma   . Atrial fibrillation ([place])   . Bipolar 1 disorder ([place])   . Bulging lumbar disc   . Chronic pain of left knee   . COPD (chronic obstructive pulmonary disease) ([place])   . CVA (cerebral vascular accident) ([place])   . Diabetes mellitus   . Neuropathy   . TIA (transient ischemic attack)   . Vertigo     Past Surgical History:  Procedure Laterality Date  . ABDOMINAL HYSTERECTOMY    . CARPAL TUNNEL RELEASE Right   . CHOLECYSTECTOMY    . KNEE ARTHROSCOPY WITH MEDIAL MENISECTOMY Right 04/14/2016   Procedure: KNEE ARTHROSCOPY WITH MEDIAL AND LATERAL MENISECTOMY, MICROFRACTURE REPAIR;  Surgeon: Carole Civil, MD;  Location: [facility];  Service: Orthopedics;  Laterality: Right;  lateral menisectomy - needs crutch training    Current Medications: No outpatient  medications have been marked as taking for the 07/09/20 encounter (Appointment) with [name], MD.     Allergies:   Aspirin   Social History   Socioeconomic History  . Marital status: Single    Spouse name: Not on file  . Number of children: 0  . Years of education: 8  . Highest education level: High school graduate  Occupational History  . Occupation: Unemployed  Tobacco Use  . Smoking status: Current Some Day Smoker    Packs/day: 0.50    Types: Cigarettes  . Smokeless tobacco: Never Used  .  Tobacco comment: tobacco info given  Vaping Use  . Vaping Use: Some days  Substance and Sexual Activity  . Alcohol use: No  . Drug use: No  . Sexual activity: Never  Other Topics Concern  . Not on file  Social History Narrative   Lives at home alone.   Right-handed.   No caffeine use.   Social Determinants of Health   Financial Resource Strain: Not on file  Food Insecurity: Not on file  Transportation Needs: Not on file  Physical Activity: Not on file  Stress: Not on file  Social Connections: Not on file     Family History: The patient's family history includes Diabetes in her mother; Heart attack in her father.  ROS:   Please see the history of present illness.     All other systems reviewed and are negative.  EKGs/Labs/Other Studies Reviewed:    The following studies were reviewed today:   EKG:  EKG is ordered today.  The ekg ordered today demonstrates normal sinus rhythm, rate 62, nonspecific T wave flattening  Recent Labs: 12/19/2019: Magnesium 1.6 03/17/2020: ALT 15; BUN 15; Creatinine, Ser 0.91; Hemoglobin 11.5; Platelets 206; Potassium 4.2; Sodium 139; TSH 0.600  Recent Lipid Panel    Component Value Date/Time   CHOL 116 06/08/2020 1412   TRIG 125 06/08/2020 1412   HDL 26 (L) 06/08/2020 1412   CHOLHDL 4.5 (H) 06/08/2020 1412   CHOLHDL 11.2 08/04/2019 0548   VLDL 32 08/04/2019 0548   LDLCALC 67 06/08/2020 1412    Physical Exam:    VS:  There were no vitals taken for this visit.    Wt Readings from Last 3 Encounters:  06/08/20 279 lb 3.2 oz (126.6 kg)  06/02/20 276 lb 8 oz (125.4 kg)  05/04/20 283 lb 9.6 oz (128.6 kg)     GEN: in no acute distress HEENT: Normal NECK: No JVD LYMPHATICS: No lymphadenopathy CARDIAC: RRR, no murmurs, rubs, gallops RESPIRATORY:  Clear to auscultation without rales, wheezing or rhonchi  ABDOMEN: Soft, non-tender, non-distended MUSCULOSKELETAL:  No edema SKIN: Warm and dry NEUROLOGIC:  Alert and oriented x  3 PSYCHIATRIC:  Normal affect   ASSESSMENT:    No diagnosis found. PLAN:    Dizziness: unclear cause.  Head CT negative last month.  Othostatics in clinic were unremarkable.  Possibly vertigo by description, will give trial of meclizine and referred to neurology for further evaluation.  Brain MRI on 06/16/2020 was unremarkable.  Zio patch x3 days on 05/25/2020 showed no significant arrhythmias.  Chest pain: description suggests typical angina and has significant risk factors (type 2 diabetes, hypertension, tobacco use).  TTE on 08/03/2019 showed LVEF 60-65%, normal RV function, normal biatrial size, no significant valvular disease.  Lexiscan Myoview on 01/03/2020 showed low risk stress test with breast attenuation but no ischemia, LVEF 48%.  No further cardiac work-up recommended.  Atrial fibrillation: CHA2DS2-VASc score 5 (hypertension,  diabetes, CVA, female).  Zio patch x14 days on 01/29/2020 showed 1% atrial flutter burden, average heart rate 86 bpm. -Continue Xarelto -Continue metoprolol 25 mg twice daily  Hypertension: On amlodipine 10 mg daily, lisinopril 20 mg daily, metoprolol 25 mg twice daily.    T2DM: On insulin.  A1c 10.5 on 06/08/2020  Hyperlipidemia: Given history of diabetes and possible CVA, should be on statin, started rosuvastatin 10 mg daily at prior clinic visit.  LDL 67 on 06/08/2020.  Tobacco use: Patient counseled on the risks of tobacco use and cessation strongly encouraged  TIA: Follows with neurology.  Carotid duplex on 01/14/2020 showed bilateral 1 to 39% stenosis.  Zio patch x14 days on 01/29/2020 showed 1% atrial flutter burden, average heart rate 86 bpm.  Snoring: sleep study with mild OSA, no indication for CPAP at this time  RTC in ***  Medication Adjustments/Labs and Tests Ordered: Current medicines are reviewed at length with the patient today.  Concerns regarding medicines are outlined above.  No orders of the defined types were placed in this  encounter.  No orders of the defined types were placed in this encounter.   There are no Patient Instructions on file for this visit.   Signed, [name], MD  07/05/2020 4:32 PM     [facility]

## 2020-07-07 DIAGNOSIS — E1142 Type 2 diabetes mellitus with diabetic polyneuropathy: Secondary | ICD-10-CM | POA: Diagnosis not present

## 2020-07-08 DIAGNOSIS — E1142 Type 2 diabetes mellitus with diabetic polyneuropathy: Secondary | ICD-10-CM | POA: Diagnosis not present

## 2020-07-09 ENCOUNTER — Other Ambulatory Visit: Payer: Self-pay

## 2020-07-09 ENCOUNTER — Ambulatory Visit: Payer: Medicaid Other | Admitting: Cardiology

## 2020-07-09 DIAGNOSIS — E1142 Type 2 diabetes mellitus with diabetic polyneuropathy: Secondary | ICD-10-CM | POA: Diagnosis not present

## 2020-07-09 DIAGNOSIS — Z7689 Persons encountering health services in other specified circumstances: Secondary | ICD-10-CM | POA: Diagnosis not present

## 2020-07-11 DIAGNOSIS — E1142 Type 2 diabetes mellitus with diabetic polyneuropathy: Secondary | ICD-10-CM | POA: Diagnosis not present

## 2020-07-12 DIAGNOSIS — E1142 Type 2 diabetes mellitus with diabetic polyneuropathy: Secondary | ICD-10-CM | POA: Diagnosis not present

## 2020-07-13 DIAGNOSIS — E1142 Type 2 diabetes mellitus with diabetic polyneuropathy: Secondary | ICD-10-CM | POA: Diagnosis not present

## 2020-07-14 DIAGNOSIS — E1142 Type 2 diabetes mellitus with diabetic polyneuropathy: Secondary | ICD-10-CM | POA: Diagnosis not present

## 2020-07-15 ENCOUNTER — Other Ambulatory Visit (INDEPENDENT_AMBULATORY_CARE_PROVIDER_SITE_OTHER): Payer: Self-pay | Admitting: Primary Care

## 2020-07-15 ENCOUNTER — Other Ambulatory Visit: Payer: Self-pay | Admitting: Orthopedic Surgery

## 2020-07-15 ENCOUNTER — Other Ambulatory Visit: Payer: Self-pay | Admitting: Cardiology

## 2020-07-15 DIAGNOSIS — E1100 Type 2 diabetes mellitus with hyperosmolarity without nonketotic hyperglycemic-hyperosmolar coma (NKHHC): Secondary | ICD-10-CM

## 2020-07-15 DIAGNOSIS — E1142 Type 2 diabetes mellitus with diabetic polyneuropathy: Secondary | ICD-10-CM | POA: Diagnosis not present

## 2020-07-16 ENCOUNTER — Other Ambulatory Visit (INDEPENDENT_AMBULATORY_CARE_PROVIDER_SITE_OTHER): Payer: Self-pay | Admitting: Primary Care

## 2020-07-16 ENCOUNTER — Other Ambulatory Visit (INDEPENDENT_AMBULATORY_CARE_PROVIDER_SITE_OTHER): Payer: Self-pay | Admitting: Family Medicine

## 2020-07-16 DIAGNOSIS — E1142 Type 2 diabetes mellitus with diabetic polyneuropathy: Secondary | ICD-10-CM | POA: Diagnosis not present

## 2020-07-16 MED ORDER — CALCIUM CARBONATE-VITAMIN D 500-200 MG-UNIT PO TABS: 1.0000 | ORAL_TABLET | Freq: Every day | ORAL | 1 refills | Status: DC

## 2020-07-16 NOTE — Telephone Encounter (Signed)
This is Dr. Schumann's pt 

## 2020-07-16 NOTE — Telephone Encounter (Signed)
Sent to PCP to refill if appropriate.  

## 2020-07-16 NOTE — Telephone Encounter (Signed)
Please review for refill on Meclizine. The patient was to only take for 1 week and then contact the office if not any better.

## 2020-07-17 DIAGNOSIS — E1142 Type 2 diabetes mellitus with diabetic polyneuropathy: Secondary | ICD-10-CM | POA: Diagnosis not present

## 2020-07-20 DIAGNOSIS — E1142 Type 2 diabetes mellitus with diabetic polyneuropathy: Secondary | ICD-10-CM | POA: Diagnosis not present

## 2020-07-21 DIAGNOSIS — E1142 Type 2 diabetes mellitus with diabetic polyneuropathy: Secondary | ICD-10-CM | POA: Diagnosis not present

## 2020-07-22 ENCOUNTER — Other Ambulatory Visit: Payer: Self-pay | Admitting: Cardiology

## 2020-07-22 DIAGNOSIS — E1142 Type 2 diabetes mellitus with diabetic polyneuropathy: Secondary | ICD-10-CM | POA: Diagnosis not present

## 2020-07-22 NOTE — Telephone Encounter (Signed)
This is Dr. Schumann's pt 

## 2020-07-23 DIAGNOSIS — E1142 Type 2 diabetes mellitus with diabetic polyneuropathy: Secondary | ICD-10-CM | POA: Diagnosis not present

## 2020-07-24 ENCOUNTER — Other Ambulatory Visit (INDEPENDENT_AMBULATORY_CARE_PROVIDER_SITE_OTHER): Payer: Self-pay | Admitting: Primary Care

## 2020-07-24 DIAGNOSIS — Z7689 Persons encountering health services in other specified circumstances: Secondary | ICD-10-CM | POA: Diagnosis not present

## 2020-07-24 DIAGNOSIS — Z79899 Other long term (current) drug therapy: Secondary | ICD-10-CM | POA: Diagnosis not present

## 2020-07-24 DIAGNOSIS — M545 Low back pain, unspecified: Secondary | ICD-10-CM | POA: Diagnosis not present

## 2020-07-24 DIAGNOSIS — M25562 Pain in left knee: Secondary | ICD-10-CM | POA: Diagnosis not present

## 2020-07-24 DIAGNOSIS — M25561 Pain in right knee: Secondary | ICD-10-CM | POA: Diagnosis not present

## 2020-07-24 DIAGNOSIS — G8929 Other chronic pain: Secondary | ICD-10-CM | POA: Diagnosis not present

## 2020-07-24 DIAGNOSIS — E1142 Type 2 diabetes mellitus with diabetic polyneuropathy: Secondary | ICD-10-CM | POA: Diagnosis not present

## 2020-07-24 NOTE — Telephone Encounter (Signed)
Sent to PCP to refill if appropriate.  

## 2020-07-25 DIAGNOSIS — E1142 Type 2 diabetes mellitus with diabetic polyneuropathy: Secondary | ICD-10-CM | POA: Diagnosis not present

## 2020-07-26 DIAGNOSIS — E1142 Type 2 diabetes mellitus with diabetic polyneuropathy: Secondary | ICD-10-CM | POA: Diagnosis not present

## 2020-07-27 ENCOUNTER — Other Ambulatory Visit (INDEPENDENT_AMBULATORY_CARE_PROVIDER_SITE_OTHER): Payer: Self-pay | Admitting: Primary Care

## 2020-07-27 DIAGNOSIS — E1142 Type 2 diabetes mellitus with diabetic polyneuropathy: Secondary | ICD-10-CM | POA: Diagnosis not present

## 2020-07-28 DIAGNOSIS — E1142 Type 2 diabetes mellitus with diabetic polyneuropathy: Secondary | ICD-10-CM | POA: Diagnosis not present

## 2020-07-29 DIAGNOSIS — E1142 Type 2 diabetes mellitus with diabetic polyneuropathy: Secondary | ICD-10-CM | POA: Diagnosis not present

## 2020-07-30 ENCOUNTER — Other Ambulatory Visit: Payer: Self-pay | Admitting: Cardiology

## 2020-07-30 DIAGNOSIS — E1142 Type 2 diabetes mellitus with diabetic polyneuropathy: Secondary | ICD-10-CM | POA: Diagnosis not present

## 2020-07-31 ENCOUNTER — Telehealth: Payer: Self-pay | Admitting: Cardiology

## 2020-07-31 DIAGNOSIS — E1142 Type 2 diabetes mellitus with diabetic polyneuropathy: Secondary | ICD-10-CM | POA: Diagnosis not present

## 2020-07-31 NOTE — Telephone Encounter (Signed)
   *  STAT* If patient is at the pharmacy, call can be transferred to refill team.   1. Which medications need to be refilled? (please list name of each medication and dose if known)   meclizine (ANTIVERT) 12.5 MG tablet     2. Which pharmacy/location (including street and city if local pharmacy) is medication to be sent to? [place], [place]  3. Do they need a 30 day or 90 day supply? 1 box

## 2020-08-01 DIAGNOSIS — E1142 Type 2 diabetes mellitus with diabetic polyneuropathy: Secondary | ICD-10-CM | POA: Diagnosis not present

## 2020-08-02 DIAGNOSIS — E1142 Type 2 diabetes mellitus with diabetic polyneuropathy: Secondary | ICD-10-CM | POA: Diagnosis not present

## 2020-08-02 NOTE — Progress Notes (Signed)
Cardiology Office Note:    Date:  08/05/2020   ID:  Rhonda Hickman, DOB Sep 16, 1955, MRN [id number]  PCP:  [name], NP  Cardiologist:  No primary care provider on file.  Electrophysiologist:  None   Referring MD: [name], NP   Chief Complaint  Patient presents with  . Atrial Fibrillation    History of Present Illness:    Rhonda Hickman is a 65 y.o. female with a hx of TIA, atrial fibrillation, BPD, COPD, T2DM, asthma who presents for follow-up.  She was referred by Rhonda Mire, NP for evaluation of atrial fibrillation, initially seen on 12/16/2019.  She reports that she was in the hospital for 5 days in [place] in May 2021.  States that she was admitted with AKI and UTI and found to have atrial fibrillation.  Started on Xarelto.  States that she has been taking this without any bleeding issues.  Reports she has had intermittent palpitations over the last month since her discharge from the hospital.  She reports a history of stabbing chest pain in the center of her chest but has not had any recently, does however chest pressure with exertion.  States that going for walks causes her to have exertional chest pressure.  She is smoking about half a pack per day, down from peak of 2 packs/day.  States that both her mother and father had MIs but unsure of age.  TTE on 08/03/2019 showed LVEF 60-65%, normal RV function, normal biatrial size, no significant valvular disease.  Lexiscan Myoview on 01/03/2020 showed low risk stress test with breast attenuation but no ischemia, LVEF 48%.  Carotid duplex on 01/14/2020 showed bilateral 1 to 39% stenosis.  Zio patch x14 days on 01/29/2020 showed 1% atrial flutter burden, average heart rate 86 bpm.  Zio patch x3 days on 05/25/2020 showed no significant arrhythmias.  Since last clinic visit, reports she continues have dizziness.  Occurs with certain head movements.  States that her chest pain has improved.  Does report she  continues to have dyspnea, improves with inhaler use.  Also reports has had some lower extremity edema.  Continues to have intermittent palpitations.  Continues to smoke, down 0.75 packs/day.  She is taking Xarelto, does report she has noted some blood on toilet paper at times.  She has never had a colonoscopy.   Past Medical History:  Diagnosis Date  . Anemia   . Anxiety   . Asthma   . Atrial fibrillation ([place])   . Bipolar 1 disorder ([place])   . Bulging lumbar disc   . Chronic pain of left knee   . COPD (chronic obstructive pulmonary disease) ([place])   . CVA (cerebral vascular accident) ([place])   . Diabetes mellitus   . Neuropathy   . TIA (transient ischemic attack)   . Vertigo     Past Surgical History:  Procedure Laterality Date  . ABDOMINAL HYSTERECTOMY    . CARPAL TUNNEL RELEASE Right   . CHOLECYSTECTOMY    . KNEE ARTHROSCOPY WITH MEDIAL MENISECTOMY Right 04/14/2016   Procedure: KNEE ARTHROSCOPY WITH MEDIAL AND LATERAL MENISECTOMY, MICROFRACTURE REPAIR;  Surgeon: Carole Civil, MD;  Location: [facility];  Service: Orthopedics;  Laterality: Right;  lateral menisectomy - needs crutch training    Current Medications: No outpatient medications have been marked as taking for the 08/05/20 encounter (Office Visit) with [name], MD.     Allergies:   Aspirin   Social History   Socioeconomic History  .  Marital status: Single    Spouse name: Not on file  . Number of children: 0  . Years of education: 74  . Highest education level: High school graduate  Occupational History  . Occupation: Unemployed  Tobacco Use  . Smoking status: Current Some Day Smoker    Packs/day: 0.50    Types: Cigarettes  . Smokeless tobacco: Never Used  . Tobacco comment: tobacco info given  Vaping Use  . Vaping Use: Some days  Substance and Sexual Activity  . Alcohol use: No  . Drug use: No  . Sexual activity: Never  Other Topics Concern  . Not on file  Social History Narrative    Lives at home alone.   Right-handed.   No caffeine use.   Social Determinants of Health   Financial Resource Strain: Not on file  Food Insecurity: Not on file  Transportation Needs: Not on file  Physical Activity: Not on file  Stress: Not on file  Social Connections: Not on file     Family History: The patient's family history includes Diabetes in her mother; Heart attack in her father.  ROS:   Please see the history of present illness.     All other systems reviewed and are negative.  EKGs/Labs/Other Studies Reviewed:    The following studies were reviewed today:   EKG:  EKG is not ordered today.  The ekg ordered at prior clinic visit demonstrates normal sinus rhythm, rate 62, nonspecific T wave flattening  Recent Labs: 12/19/2019: Magnesium 1.6 03/17/2020: ALT 15; BUN 15; Creatinine, Ser 0.91; Hemoglobin 11.5; Platelets 206; Potassium 4.2; Sodium 139; TSH 0.600  Recent Lipid Panel    Component Value Date/Time   CHOL 116 06/08/2020 1412   TRIG 125 06/08/2020 1412   HDL 26 (L) 06/08/2020 1412   CHOLHDL 4.5 (H) 06/08/2020 1412   CHOLHDL 11.2 08/04/2019 0548   VLDL 32 08/04/2019 0548   LDLCALC 67 06/08/2020 1412    Physical Exam:    VS:  BP 106/70   Pulse 70   Ht 6\' 3"  (1.905 m)   Wt 276 lb (125.2 kg)   BMI 34.50 kg/m     Wt Readings from Last 3 Encounters:  08/05/20 276 lb (125.2 kg)  06/08/20 279 lb 3.2 oz (126.6 kg)  06/02/20 276 lb 8 oz (125.4 kg)     GEN: in no acute distress HEENT: Normal NECK: No JVD LYMPHATICS: No lymphadenopathy CARDIAC: RRR, no murmurs, rubs, gallops RESPIRATORY:  Clear to auscultation without rales, wheezing or rhonchi  ABDOMEN: Soft, non-tender, non-distended MUSCULOSKELETAL:  No edema SKIN: Warm and dry NEUROLOGIC:  Alert and oriented x 3 PSYCHIATRIC:  Normal affect   ASSESSMENT:    1. Atrial fibrillation, unspecified type ([place])   2. Rectal bleeding   3. Dizziness   4. Chest pain, unspecified type   5. Tobacco use    6. Hyperlipidemia, unspecified hyperlipidemia type   7. Essential hypertension    PLAN:    Dizziness: unclear cause.  Head CT negative.  Othostatics in clinic were unremarkable.  Possibly vertigo by description, started on meclizine and referred to neurology for further evaluation.  Brain MRI on 06/16/2020 was unremarkable.  Zio patch x3 days on 05/25/2020 showed no significant arrhythmias.  Chest pain: description suggests typical angina and has significant risk factors (type 2 diabetes, hypertension, tobacco use).  TTE on 08/03/2019 showed LVEF 60-65%, normal RV function, normal biatrial size, no significant valvular disease.  Lexiscan Myoview on 01/03/2020 showed low risk stress test  with breast attenuation but no ischemia, LVEF 48%.  No further cardiac work-up recommended.  Atrial fibrillation: CHA2DS2-VASc score 5 (hypertension, diabetes, CVA, female).  Zio patch x14 days on 01/29/2020 showed 1% atrial flutter burden, average heart rate 86 bpm. -Continue Xarelto.   -Continue metoprolol 25 mg twice daily  Hypertension: On amlodipine 5 mg daily, lisinopril 20 mg daily, metoprolol 25 mg twice daily.    T2DM: On insulin.  A1c 10.5 on 06/08/2020  Hyperlipidemia: Given history of diabetes and possible CVA, should be on statin, started rosuvastatin 10 mg daily at prior clinic visit.  LDL 67 on 06/08/2020.  Tobacco use: Patient counseled on the risks of tobacco use and cessation strongly encouraged.  Will ask care guide to work with patient on cessation plan  TIA: Follows with neurology.  Carotid duplex on 01/14/2020 showed bilateral 1 to 39% stenosis.  Zio patch x14 days on 01/29/2020 showed 1% atrial flutter burden, average heart rate 86 bpm.  Snoring: sleep study with mild OSA, no indication for CPAP at this time  Rectal bleeding: As noted bright red blood per rectum times.  On Xarelto for atrial fibrillation.  Will check CBC.  She has never had colonoscopy, will refer to gastroenterology  RTC  in 6 months  Medication Adjustments/Labs and Tests Ordered: Current medicines are reviewed at length with the patient today.  Concerns regarding medicines are outlined above.  Orders Placed This Encounter  Procedures  . CBC  . Ambulatory referral to Gastroenterology   No orders of the defined types were placed in this encounter.   Patient Instructions  Medication Instructions:  Your Physician recommend you continue on your current medication as directed.    *If you need a refill on your cardiac medications before your next appointment, please call your pharmacy*   Lab Work: Your physician recommends lab work today (CBC).   If you have labs (blood work) drawn today and your tests are completely normal, you will receive your results only by: Marland Kitchen MyChart Message (if you have MyChart) OR . A paper copy in the mail If you have any lab test that is abnormal or we need to change your treatment, we will call you to review the results.   Testing/Procedures: None   Follow-Up: At [facility], you and your health needs are our priority.  As part of our continuing mission to provide you with exceptional heart care, we have created designated Provider Care Teams.  These Care Teams include your primary Cardiologist (physician) and Advanced Practice Providers (APPs -  Physician Assistants and Nurse Practitioners) who all work together to provide you with the care you need, when you need it.  We recommend signing up for the patient portal called "MyChart".  Sign up information is provided on this After Visit Summary.  MyChart is used to connect with patients for Virtual Visits (Telemedicine).  Patients are able to view lab/test results, encounter notes, upcoming appointments, etc.  Non-urgent messages can be sent to your provider as well.   To learn more about what you can do with MyChart, go to [email or web address].    Your next appointment:   6 month(s)  The format for your next  appointment:   In Person  Provider:   Oswaldo Milian, MD       Signed, [name], MD  08/05/2020 6:51 PM    [place]

## 2020-08-03 ENCOUNTER — Ambulatory Visit: Payer: Medicaid Other | Admitting: Cardiology

## 2020-08-03 DIAGNOSIS — E1142 Type 2 diabetes mellitus with diabetic polyneuropathy: Secondary | ICD-10-CM | POA: Diagnosis not present

## 2020-08-04 ENCOUNTER — Telehealth: Payer: Self-pay | Admitting: Cardiology

## 2020-08-04 DIAGNOSIS — E1142 Type 2 diabetes mellitus with diabetic polyneuropathy: Secondary | ICD-10-CM | POA: Diagnosis not present

## 2020-08-04 NOTE — Telephone Encounter (Signed)
Rx has been sent to the pharmacy electronically. 07/22/20

## 2020-08-04 NOTE — Telephone Encounter (Signed)
*  STAT* If patient is at the pharmacy, call can be transferred to refill team.   1. Which medications need to be refilled? (please list name of each medication and dose if known)  meclizine (ANTIVERT) 12.5 MG tablet   2. Which pharmacy/location (including street and city if local pharmacy) is medication to be sent to? [place], [place]  3. Do they need a 30 day or 90 day supply? Patient needs 90 day supply.  Patient is out of this medication, please advise.

## 2020-08-05 ENCOUNTER — Ambulatory Visit (INDEPENDENT_AMBULATORY_CARE_PROVIDER_SITE_OTHER): Payer: Medicaid Other | Admitting: Cardiology

## 2020-08-05 ENCOUNTER — Other Ambulatory Visit: Payer: Self-pay

## 2020-08-05 ENCOUNTER — Encounter: Payer: Self-pay | Admitting: Cardiology

## 2020-08-05 VITALS — BP 106/70 | HR 70 | Ht 75.0 in | Wt 276.0 lb

## 2020-08-05 DIAGNOSIS — I4891 Unspecified atrial fibrillation: Secondary | ICD-10-CM

## 2020-08-05 DIAGNOSIS — E1142 Type 2 diabetes mellitus with diabetic polyneuropathy: Secondary | ICD-10-CM | POA: Diagnosis not present

## 2020-08-05 DIAGNOSIS — Z7689 Persons encountering health services in other specified circumstances: Secondary | ICD-10-CM | POA: Diagnosis not present

## 2020-08-05 DIAGNOSIS — K625 Hemorrhage of anus and rectum: Secondary | ICD-10-CM

## 2020-08-05 DIAGNOSIS — Z720 Tobacco use: Secondary | ICD-10-CM

## 2020-08-05 DIAGNOSIS — E785 Hyperlipidemia, unspecified: Secondary | ICD-10-CM

## 2020-08-05 DIAGNOSIS — R079 Chest pain, unspecified: Secondary | ICD-10-CM | POA: Diagnosis not present

## 2020-08-05 DIAGNOSIS — I10 Essential (primary) hypertension: Secondary | ICD-10-CM

## 2020-08-05 DIAGNOSIS — R42 Dizziness and giddiness: Secondary | ICD-10-CM

## 2020-08-05 NOTE — Patient Instructions (Signed)
Medication Instructions:  Your Physician recommend you continue on your current medication as directed.    *If you need a refill on your cardiac medications before your next appointment, please call your pharmacy*   Lab Work: Your physician recommends lab work today (CBC).   If you have labs (blood work) drawn today and your tests are completely normal, you will receive your results only by: Marland Kitchen MyChart Message (if you have MyChart) OR . A paper copy in the mail If you have any lab test that is abnormal or we need to change your treatment, we will call you to review the results.   Testing/Procedures: None   Follow-Up: At [facility], you and your health needs are our priority.  As part of our continuing mission to provide you with exceptional heart care, we have created designated Provider Care Teams.  These Care Teams include your primary Cardiologist (physician) and Advanced Practice Providers (APPs -  Physician Assistants and Nurse Practitioners) who all work together to provide you with the care you need, when you need it.  We recommend signing up for the patient portal called "MyChart".  Sign up information is provided on this After Visit Summary.  MyChart is used to connect with patients for Virtual Visits (Telemedicine).  Patients are able to view lab/test results, encounter notes, upcoming appointments, etc.  Non-urgent messages can be sent to your provider as well.   To learn more about what you can do with MyChart, go to [email or web address].    Your next appointment:   6 month(s)  The format for your next appointment:   In Person  Provider:   Oswaldo Milian, MD

## 2020-08-06 ENCOUNTER — Other Ambulatory Visit: Payer: Self-pay | Admitting: Cardiology

## 2020-08-06 DIAGNOSIS — E1142 Type 2 diabetes mellitus with diabetic polyneuropathy: Secondary | ICD-10-CM | POA: Diagnosis not present

## 2020-08-06 LAB — CBC
Hematocrit: 38.9 % (ref 34.0–46.6)
Hemoglobin: 12.8 g/dL (ref 11.1–15.9)
MCH: 28.9 pg (ref 26.6–33.0)
MCHC: 32.9 g/dL (ref 31.5–35.7)
MCV: 88 fL (ref 79–97)
Platelets: 201 10*3/uL (ref 150–450)
RBC: 4.43 x10E6/uL (ref 3.77–5.28)
RDW: 13.8 % (ref 11.7–15.4)
WBC: 8.8 10*3/uL (ref 3.4–10.8)

## 2020-08-07 ENCOUNTER — Other Ambulatory Visit: Payer: Self-pay | Admitting: Orthopedic Surgery

## 2020-08-07 ENCOUNTER — Telehealth: Payer: Self-pay

## 2020-08-07 DIAGNOSIS — Z0000 Encounter for general adult medical examination without abnormal findings: Secondary | ICD-10-CM

## 2020-08-07 DIAGNOSIS — E1142 Type 2 diabetes mellitus with diabetic polyneuropathy: Secondary | ICD-10-CM | POA: Diagnosis not present

## 2020-08-07 NOTE — Telephone Encounter (Signed)
Called patient to discuss health coaching for smoking cessation. Patient is interested and is scheduled for an initial session on 08/11/20 at 8:30 am. Patient also have concerns regarding stress management. This issue will also be discussed during the initial session.

## 2020-08-08 DIAGNOSIS — E1142 Type 2 diabetes mellitus with diabetic polyneuropathy: Secondary | ICD-10-CM | POA: Diagnosis not present

## 2020-08-09 DIAGNOSIS — E1142 Type 2 diabetes mellitus with diabetic polyneuropathy: Secondary | ICD-10-CM | POA: Diagnosis not present

## 2020-08-10 ENCOUNTER — Telehealth: Payer: Self-pay | Admitting: Cardiology

## 2020-08-10 DIAGNOSIS — E1142 Type 2 diabetes mellitus with diabetic polyneuropathy: Secondary | ICD-10-CM | POA: Diagnosis not present

## 2020-08-10 NOTE — Telephone Encounter (Signed)
*  STAT* If patient is at the pharmacy, call can be transferred to refill team.   1. Which medications need to be refilled? (please list name of each medication and dose if known)   meclizine (ANTIVERT) 12.5 MG tablet   2. Which pharmacy/location (including street and city if local pharmacy) is medication to be sent to? [facility], [place]  3. Do they need a 30 day or 90 day supply? 30 day supply  Prescription was previously denied due to being sent on 07/22/20, but that prescription was only 10 days worth of medication since the patient takes it 3x daily and only 30 tablets were sent.

## 2020-08-11 ENCOUNTER — Other Ambulatory Visit: Payer: Self-pay

## 2020-08-11 ENCOUNTER — Ambulatory Visit: Payer: Medicaid Other

## 2020-08-11 ENCOUNTER — Ambulatory Visit (INDEPENDENT_AMBULATORY_CARE_PROVIDER_SITE_OTHER): Payer: Medicaid Other

## 2020-08-11 ENCOUNTER — Telehealth: Payer: Self-pay

## 2020-08-11 ENCOUNTER — Other Ambulatory Visit (INDEPENDENT_AMBULATORY_CARE_PROVIDER_SITE_OTHER): Payer: Self-pay | Admitting: Primary Care

## 2020-08-11 DIAGNOSIS — E1100 Type 2 diabetes mellitus with hyperosmolarity without nonketotic hyperglycemic-hyperosmolar coma (NKHHC): Secondary | ICD-10-CM

## 2020-08-11 DIAGNOSIS — Z0000 Encounter for general adult medical examination without abnormal findings: Secondary | ICD-10-CM

## 2020-08-11 DIAGNOSIS — E1142 Type 2 diabetes mellitus with diabetic polyneuropathy: Secondary | ICD-10-CM | POA: Diagnosis not present

## 2020-08-11 NOTE — Progress Notes (Signed)
Appointment Outcome:  Completed, Session #: Initial health coaching session  AGREEMENTS SECTION    Overall Goal(s): Smoking cessation Stress management                                             Progress Notes:  Was able to go through the health coaching packet and Code of Ethics with patient. Patient had no questions regarding the agreement. Patient has been mailed a copy of the health coaching agreement to sign and mail back.   Coaching Outcomes: Patient was ill and wants to continue this session later. Patient will be rescheduled for initial session to begin identifying stressors/triggers and setting up initial steps towards smoking cessation and stress management.

## 2020-08-11 NOTE — Telephone Encounter (Signed)
Rhonda Hickman is calling back requesting to speak with Amy in regards to their previous conversation. Please advise.

## 2020-08-11 NOTE — Telephone Encounter (Signed)
Called patient back for health coaching session and she is not feeling well. That is what the concern was about when she called earlier. She has been rescheduled for 08/19/2020 at 2:45pm.

## 2020-08-11 NOTE — Telephone Encounter (Signed)
Called the patient during scheduled health coaching session. Patient has a schedule conflict. Rescheduled appointment for today, 08/11/20 at 1:00pm. Will called patient during scheduled time.

## 2020-08-12 ENCOUNTER — Encounter: Payer: Self-pay | Admitting: Orthopedic Surgery

## 2020-08-12 ENCOUNTER — Telehealth: Payer: Self-pay

## 2020-08-12 ENCOUNTER — Telehealth: Payer: Self-pay | Admitting: Cardiology

## 2020-08-12 ENCOUNTER — Ambulatory Visit (INDEPENDENT_AMBULATORY_CARE_PROVIDER_SITE_OTHER): Payer: Medicaid Other | Admitting: Orthopedic Surgery

## 2020-08-12 VITALS — BP 149/81 | HR 64 | Ht 75.0 in | Wt 276.0 lb

## 2020-08-12 DIAGNOSIS — M25561 Pain in right knee: Secondary | ICD-10-CM

## 2020-08-12 DIAGNOSIS — G8929 Other chronic pain: Secondary | ICD-10-CM

## 2020-08-12 DIAGNOSIS — M5136 Other intervertebral disc degeneration, lumbar region: Secondary | ICD-10-CM

## 2020-08-12 DIAGNOSIS — M25562 Pain in left knee: Secondary | ICD-10-CM

## 2020-08-12 DIAGNOSIS — R600 Localized edema: Secondary | ICD-10-CM

## 2020-08-12 DIAGNOSIS — E1142 Type 2 diabetes mellitus with diabetic polyneuropathy: Secondary | ICD-10-CM | POA: Diagnosis not present

## 2020-08-12 DIAGNOSIS — G894 Chronic pain syndrome: Secondary | ICD-10-CM

## 2020-08-12 NOTE — Telephone Encounter (Signed)
error 

## 2020-08-12 NOTE — Progress Notes (Signed)
Chief Complaint  Patient presents with  . Knee Pain    Rt knee pain     This is a 65 year old female with bilateral knee pain right worse than left presents for reevaluation of her ongoing chronic pain in her knees.  She has hypertension diabetes atrial fibrillation she is on blood thinner she has chronic back pain.  She is anxious to have knee replacement surgery despite all of her medical problems.  Her BMI is 34 but again her medical history of atrial fibrillation is a significant risk factor for surgery including Xarelto which would have to be stopped.  She also has chronic pain and sees a pain management specialist which is also an issue       BP (!) 149/81   Pulse 64   Ht 6\' 3"  (1.905 m)   Wt 276 lb (125.2 kg)   BMI 34.50 kg/m   Body mass index is 34.5 kg/m.    Current Outpatient Medications:  .  albuterol (VENTOLIN HFA) 108 (90 Base) MCG/ACT inhaler, Inhale 2 puffs into the lungs every 6 (six) hours as needed for wheezing or shortness of breath., Disp: 8 g, Rfl: 1 .  amLODipine (NORVASC) 10 MG tablet, Take 1 tablet (10 mg total) by mouth daily. (Patient taking differently: Take 5 mg by mouth daily.), Disp: 90 tablet, Rfl: 3 .  budesonide-formoterol (SYMBICORT) 160-4.5 MCG/ACT inhaler, Inhale 2 puffs into the lungs 2 (two) times daily., Disp: 1 Inhaler, Rfl: 3 .  calcium-vitamin D (OSCAL WITH D) 500-200 MG-UNIT tablet, Take 1 tablet by mouth daily with breakfast., Disp: 90 tablet, Rfl: 1 .  citalopram (CELEXA) 40 MG tablet, Take 40 mg by mouth daily., Disp: , Rfl:  .  dextromethorphan-guaiFENesin (MUCINEX DM) 30-600 MG 12hr tablet, Take 1 tablet by mouth 2 (two) times daily as needed for cough., Disp: 60 tablet, Rfl: 3 .  divalproex (DEPAKOTE ER) 500 MG 24 hr tablet, Take 500-1,000 mg by mouth See admin instructions. 500MG  in the morning and 1000mg  at bedtime, Disp: , Rfl:  .  FEROSUL 325 (65 Fe) MG tablet, TAKE (1) TABLET DAILY WITH BREAKFAST., Disp: 90 tablet, Rfl: 0 .   fluticasone (FLONASE) 50 MCG/ACT nasal spray, Place 2 sprays into both nostrils daily., Disp: 16 g, Rfl: 6 .  gabapentin (NEURONTIN) 300 MG capsule, Take 1 capsule (300 mg total) by mouth 3 (three) times daily., Disp: 270 capsule, Rfl: 11 .  GNP ULTICARE PEN NEEDLES 32G X 4 MM MISC, , Disp: , Rfl:  .  HYDROcodone-acetaminophen (NORCO) 10-325 MG tablet, Take 1 tablet by mouth every 6 (six) hours as needed., Disp: , Rfl:  .  insulin glargine (LANTUS SOLOSTAR) 100 UNIT/ML Solostar Pen, Inject 10 Units into the skin daily., Disp: 15 mL, Rfl: 3 .  insulin lispro (HUMALOG) 100 UNIT/ML cartridge, 0-12 units 3 times daily before meals as per sliding scale, Disp: 15 mL, Rfl: 3 .  Insulin Syringe-Needle U-100 (ADVOCATE INSULIN SYRINGE) 29G X 1/2" 0.3 ML MISC, Use to inject insulin 5x daily., Disp: 200 each, Rfl: 6 .  JANUVIA 100 MG tablet, TAKE 1 TABLET DAILY, Disp: 30 tablet, Rfl: 0 .  meclizine (ANTIVERT) 12.5 MG tablet, TAKE 1 TABLET 3 TIMES A DAY AS NEEDED FOR DIZZINESS, Disp: 30 tablet, Rfl: 0 .  meloxicam (MOBIC) 7.5 MG tablet, TAKE 1 TABLET DAILY, Disp: 30 tablet, Rfl: 0 .  metFORMIN (GLUCOPHAGE) 1000 MG tablet, Take 1 tablet (1,000 mg total) by mouth 2 (two) times daily with a meal., Disp:  180 tablet, Rfl: 3 .  metoprolol tartrate (LOPRESSOR) 25 MG tablet, Take 1 tablet (25 mg total) by mouth 2 (two) times daily., Disp: 180 tablet, Rfl: 3 .  oxybutynin (DITROPAN) 5 MG tablet, TAKE  (1)  TABLET TWICE A DAY., Disp: 60 tablet, Rfl: 1 .  PEG-KCl-NaCl-NaSulf-Na Asc-C (PLENVU) 140 g SOLR, Take 140 g by mouth as directed. Manufacturer's coupon Universal coupon code:BIN: P2366821; GROUP: [id number]; PCN: [id number]; ID: [id number]; PAY NO MORE $50, Disp: 1 each, Rfl: 0 .  phenazopyridine (PYRIDIUM) 100 MG tablet, Take 1 tablet (100 mg total) by mouth 3 (three) times daily as needed for pain., Disp: 10 tablet, Rfl: 0 .  rosuvastatin (CRESTOR) 10 MG tablet, Take 1 tablet (10 mg total) by mouth daily., Disp: 90 tablet,  Rfl: 3 .  sulfamethoxazole-trimethoprim (BACTRIM DS) 800-160 MG tablet, Take 1 tablet by mouth 2 (two) times daily., Disp: 20 tablet, Rfl: 0 .  Vitamin D, Ergocalciferol, (DRISDOL) 1.25 MG (50000 UNIT) CAPS capsule, Take 1 capsule (50,000 Units total) by mouth every 7 (seven) days., Disp: 32 capsule, Rfl: 0 .  XARELTO 20 MG TABS tablet, TAKE 1 TABLET WITH SUPPER, Disp: 30 tablet, Rfl: 0   Past Medical History:  Diagnosis Date  . Anemia   . Anxiety   . Asthma   . Atrial fibrillation ([place])   . Bipolar 1 disorder ([place])   . Bulging lumbar disc   . Chronic pain of left knee   . COPD (chronic obstructive pulmonary disease) ([place])   . CVA (cerebral vascular accident) ([place])   . Diabetes mellitus   . Neuropathy   . TIA (transient ischemic attack)   . Vertigo    Physical Exam Constitutional:      General: She is not in acute distress.    Appearance: She is well-developed.     Comments: Well developed, well nourished Normal grooming and hygiene     Cardiovascular:     Comments: No peripheral edema Musculoskeletal:     Comments: Rhonda Hickman has limited range of motion of both knees with bilateral flexion contractures of less than 10 degrees her knees are stable there is a small joint effusion in each knee she has good strength  She has tenderness in the medial lateral compartments of both knees  Skin:    General: Skin is warm and dry.  Neurological:     Mental Status: She is alert and oriented to person, place, and time.     Sensory: No sensory deficit.     Coordination: Coordination normal.     Gait: Gait abnormal.     Deep Tendon Reflexes: Reflexes are normal and symmetric.  Psychiatric:        Mood and Affect: Mood normal.        Behavior: Behavior normal.        Thought Content: Thought content normal.        Judgment: Judgment normal.     Comments: Affect normal     Assessment and plan  Patient is not a good candidate for knee replacement surgery with her heart condition  unless is done at a tertiary care facility if it can be done even at that.  She also has risk factors include diabetes, smoking history, chronic pain.  I did give her 2 injections  She was insistent on having the knee replacement so I told her we did refer her to another doctor  Referral to [facility]  She has some swelling of the right hand  Referral for  glove for edema  Chronic back pain status post epidural injections  Records given to her to take to pain management  Procedure note for bilateral knee injections  Procedure note left knee injection verbal consent was obtained to inject left knee joint  Timeout was completed to confirm the site of injection  The medications used were 40 mg of Depo-Medrol and 1% lidocaine 3 cc  Anesthesia was provided by ethyl chloride and the skin was prepped with alcohol.  After cleaning the skin with alcohol a 20-gauge needle was used to inject the left knee joint. There were no complications. A sterile bandage was applied.   Procedure note right knee injection verbal consent was obtained to inject right knee joint  Timeout was completed to confirm the site of injection  The medications used were 40 mg of Depo-Medrol and 1% lidocaine 3 cc  Anesthesia was provided by ethyl chloride and the skin was prepped with alcohol.  After cleaning the skin with alcohol a 20-gauge needle was used to inject the right knee joint. There were no complications. A sterile bandage was applied.

## 2020-08-12 NOTE — Telephone Encounter (Signed)
Informed patient to disregard call, appears a call was made by error. Patient verbalized understanding.

## 2020-08-12 NOTE — Patient Instructions (Addendum)
You have received an injection of steroids into the joint. 15% of patients will have increased pain within the 24 hours postinjection.   This is transient and will go away.   We recommend that you use ice packs on the injection site for 20 minutes every 2 hours and extra strength Tylenol 2 tablets every 8 as needed until the pain resolves.  If you continue to have pain after taking the Tylenol and using the ice please call the office for further instructions.   Referral to [facility] possible TKA   Elevate the hand    Edema glove referral to hand therapy     CLINICAL DATA:  Lumbosacral spondylosis without myelopathy. Mechanical low back pain with right leg radiculopathy. Minimal relief after prior L4-L5 interlaminar injection on 07/20/2018. Repeat injection requested  CLINICAL DATA:  Low back pain for 3 months.  Leg numbness.  EXAM: MRI LUMBAR SPINE WITHOUT CONTRAST  TECHNIQUE: Multiplanar, multisequence MR imaging of the lumbar spine was performed. No intravenous contrast was administered.  COMPARISON:  None.  FINDINGS: Segmentation:  Standard.  Alignment: 1-2 mm anterolisthesis of L4 on L5 secondary to facet disease.  Vertebrae:  No fracture, evidence of discitis, or bone lesion.  Conus medullaris and cauda equina: Conus extends to the L1 level. Conus and cauda equina appear normal.  Paraspinal and other soft tissues: Posterior fossa demonstrates no focal abnormality. Vertebral artery flow voids are maintained. Paraspinal soft tissues are unremarkable.  Disc levels:  Disc spaces: Degenerative disc disease with disc height loss and reactive endplate changes at Y2-Q8. Disc desiccation at L4-5.  T12-L1: No significant disc bulge. No evidence of neural foraminal stenosis. No central canal stenosis.  L1-L2: No significant disc bulge. No evidence of neural foraminal stenosis. No central canal stenosis.  L2-L3: No significant disc bulge. No evidence of  neural foraminal stenosis. No central canal stenosis.  L3-L4: No significant disc bulge. No evidence of neural foraminal stenosis. No central canal stenosis.  L4-L5: Mild broad-based disc bulge. Severe bilateral facet arthropathy. Mild bilateral foraminal stenosis. No central canal stenosis.  L5-S1: Broad-based disc bulge with a small central disc protrusion. Mild bilateral facet arthropathy. Mild bilateral foraminal stenosis. No central canal stenosis.  IMPRESSION: 1. At L4-5 there is a mild broad-based disc bulge. Severe bilateral facet arthropathy. Mild bilateral foraminal stenosis. 2. At L5-S1 there is a broad-based disc bulge with a small central disc protrusion. Mild bilateral facet arthropathy. Mild bilateral foraminal stenosis.   Electronically Signed   By: Kathreen Devoid   On: 09/05/2017 10:16

## 2020-08-12 NOTE — Telephone Encounter (Signed)
Patient states she is returning a call. She saw Dr. Aline Brochure with [facility] today and she assumes the call may be regarding this visit. She states their office will be faxing a clearance request for the patient to have knee surgery. Surgery date is TBD.

## 2020-08-13 DIAGNOSIS — E1142 Type 2 diabetes mellitus with diabetic polyneuropathy: Secondary | ICD-10-CM | POA: Diagnosis not present

## 2020-08-14 ENCOUNTER — Encounter: Payer: Self-pay | Admitting: *Deleted

## 2020-08-14 DIAGNOSIS — E1142 Type 2 diabetes mellitus with diabetic polyneuropathy: Secondary | ICD-10-CM | POA: Diagnosis not present

## 2020-08-15 DIAGNOSIS — E1142 Type 2 diabetes mellitus with diabetic polyneuropathy: Secondary | ICD-10-CM | POA: Diagnosis not present

## 2020-08-16 DIAGNOSIS — E1142 Type 2 diabetes mellitus with diabetic polyneuropathy: Secondary | ICD-10-CM | POA: Diagnosis not present

## 2020-08-17 DIAGNOSIS — E1142 Type 2 diabetes mellitus with diabetic polyneuropathy: Secondary | ICD-10-CM | POA: Diagnosis not present

## 2020-08-18 DIAGNOSIS — E1142 Type 2 diabetes mellitus with diabetic polyneuropathy: Secondary | ICD-10-CM | POA: Diagnosis not present

## 2020-08-19 ENCOUNTER — Other Ambulatory Visit: Payer: Self-pay

## 2020-08-19 ENCOUNTER — Ambulatory Visit: Payer: Medicaid Other

## 2020-08-19 DIAGNOSIS — E1142 Type 2 diabetes mellitus with diabetic polyneuropathy: Secondary | ICD-10-CM | POA: Diagnosis not present

## 2020-08-19 DIAGNOSIS — Z0000 Encounter for general adult medical examination without abnormal findings: Secondary | ICD-10-CM

## 2020-08-19 NOTE — Progress Notes (Signed)
Appointment Outcome:  Completed, Session #: Continued initial session  AGREEMENTS SECTION   Overall Goal(s): Smoking cessation                                       Stress management    Progress Notes:  Care Guide continued to conduct the initial health coaching session.  Patient mentioned that she smokes sometimes to cope with stress. At other times, she will play games on her phone, watch tv, or distance herself away from others to cope with stress.   Patient reported that she smokes about 1/2 pack of cigarettes a day when she is home because she does not smoke in her home, but outside. Patient stated that when she is out mingling, she smokes about 1.5 packs of cigarettes a day. Patient smokes a whole cigarette at a time. If she happens to smoke half a cigarette, then she will not relight it because it will be too strong.  Patient stated that gum has helped in the past with reducing smoking, but she cannot chew gum at this time because she doesn't have teeth. Patient has not taken medication for smoking cessation. Patient reported that nicotine patches does not work for her.   Patient stated that she has switched the brand of cigarettes, which helps with how much she smokes. She stated that she has cut back on her own. However, she becomes upset when others tell her to stop smoking it makes her want to smoke more.    Coaching Outcomes: Patient stated that she is not ready to quit smoking yet. Patient was asked what it would take to get her to the point of being ready. Patient replied that it would take God to call her home first. Patient was reluctant to agree to any steps towards smoking cessation after being encouraged to try various strategies to gradually reduce her smoking.    Note:  Health coaching agreement has been terminated as of 3:08pm on 08/19/20. Patient stated that she will call Care Guide if she changes her mind to start health coaching for smoking cessation.

## 2020-08-20 DIAGNOSIS — E1142 Type 2 diabetes mellitus with diabetic polyneuropathy: Secondary | ICD-10-CM | POA: Diagnosis not present

## 2020-08-21 DIAGNOSIS — E1142 Type 2 diabetes mellitus with diabetic polyneuropathy: Secondary | ICD-10-CM | POA: Diagnosis not present

## 2020-08-22 DIAGNOSIS — E1142 Type 2 diabetes mellitus with diabetic polyneuropathy: Secondary | ICD-10-CM | POA: Diagnosis not present

## 2020-08-23 DIAGNOSIS — E1142 Type 2 diabetes mellitus with diabetic polyneuropathy: Secondary | ICD-10-CM | POA: Diagnosis not present

## 2020-08-24 DIAGNOSIS — Z7689 Persons encountering health services in other specified circumstances: Secondary | ICD-10-CM | POA: Diagnosis not present

## 2020-08-24 DIAGNOSIS — E1142 Type 2 diabetes mellitus with diabetic polyneuropathy: Secondary | ICD-10-CM | POA: Diagnosis not present

## 2020-08-24 DIAGNOSIS — Z79899 Other long term (current) drug therapy: Secondary | ICD-10-CM | POA: Diagnosis not present

## 2020-08-25 DIAGNOSIS — E1142 Type 2 diabetes mellitus with diabetic polyneuropathy: Secondary | ICD-10-CM | POA: Diagnosis not present

## 2020-08-26 ENCOUNTER — Other Ambulatory Visit (INDEPENDENT_AMBULATORY_CARE_PROVIDER_SITE_OTHER): Payer: Self-pay | Admitting: Primary Care

## 2020-08-26 DIAGNOSIS — Z7689 Persons encountering health services in other specified circumstances: Secondary | ICD-10-CM | POA: Diagnosis not present

## 2020-08-26 DIAGNOSIS — E1142 Type 2 diabetes mellitus with diabetic polyneuropathy: Secondary | ICD-10-CM | POA: Diagnosis not present

## 2020-08-27 ENCOUNTER — Ambulatory Visit (HOSPITAL_COMMUNITY): Payer: Medicaid Other | Admitting: Occupational Therapy

## 2020-08-27 DIAGNOSIS — E1142 Type 2 diabetes mellitus with diabetic polyneuropathy: Secondary | ICD-10-CM | POA: Diagnosis not present

## 2020-08-28 DIAGNOSIS — E1142 Type 2 diabetes mellitus with diabetic polyneuropathy: Secondary | ICD-10-CM | POA: Diagnosis not present

## 2020-08-29 DIAGNOSIS — E1142 Type 2 diabetes mellitus with diabetic polyneuropathy: Secondary | ICD-10-CM | POA: Diagnosis not present

## 2020-08-30 DIAGNOSIS — E1142 Type 2 diabetes mellitus with diabetic polyneuropathy: Secondary | ICD-10-CM | POA: Diagnosis not present

## 2020-08-31 DIAGNOSIS — E1142 Type 2 diabetes mellitus with diabetic polyneuropathy: Secondary | ICD-10-CM | POA: Diagnosis not present

## 2020-09-01 DIAGNOSIS — E1142 Type 2 diabetes mellitus with diabetic polyneuropathy: Secondary | ICD-10-CM | POA: Diagnosis not present

## 2020-09-02 DIAGNOSIS — E1142 Type 2 diabetes mellitus with diabetic polyneuropathy: Secondary | ICD-10-CM | POA: Diagnosis not present

## 2020-09-03 ENCOUNTER — Other Ambulatory Visit: Payer: Self-pay | Admitting: Orthopedic Surgery

## 2020-09-03 DIAGNOSIS — E1142 Type 2 diabetes mellitus with diabetic polyneuropathy: Secondary | ICD-10-CM | POA: Diagnosis not present

## 2020-09-04 ENCOUNTER — Ambulatory Visit (HOSPITAL_COMMUNITY): Payer: Medicaid Other | Admitting: Occupational Therapy

## 2020-09-04 DIAGNOSIS — E1142 Type 2 diabetes mellitus with diabetic polyneuropathy: Secondary | ICD-10-CM | POA: Diagnosis not present

## 2020-09-05 DIAGNOSIS — E1142 Type 2 diabetes mellitus with diabetic polyneuropathy: Secondary | ICD-10-CM | POA: Diagnosis not present

## 2020-09-06 DIAGNOSIS — E1142 Type 2 diabetes mellitus with diabetic polyneuropathy: Secondary | ICD-10-CM | POA: Diagnosis not present

## 2020-09-07 ENCOUNTER — Ambulatory Visit (INDEPENDENT_AMBULATORY_CARE_PROVIDER_SITE_OTHER): Payer: Medicaid Other | Admitting: Primary Care

## 2020-09-07 ENCOUNTER — Encounter (INDEPENDENT_AMBULATORY_CARE_PROVIDER_SITE_OTHER): Payer: Self-pay | Admitting: Primary Care

## 2020-09-07 ENCOUNTER — Other Ambulatory Visit: Payer: Self-pay

## 2020-09-07 VITALS — BP 124/84 | HR 58 | Temp 97.3°F | Ht 75.0 in | Wt 292.2 lb

## 2020-09-07 DIAGNOSIS — Z794 Long term (current) use of insulin: Secondary | ICD-10-CM

## 2020-09-07 DIAGNOSIS — E1142 Type 2 diabetes mellitus with diabetic polyneuropathy: Secondary | ICD-10-CM | POA: Diagnosis not present

## 2020-09-07 DIAGNOSIS — F3340 Major depressive disorder, recurrent, in remission, unspecified: Secondary | ICD-10-CM

## 2020-09-07 DIAGNOSIS — R4781 Slurred speech: Secondary | ICD-10-CM | POA: Diagnosis not present

## 2020-09-07 DIAGNOSIS — E1100 Type 2 diabetes mellitus with hyperosmolarity without nonketotic hyperglycemic-hyperosmolar coma (NKHHC): Secondary | ICD-10-CM | POA: Diagnosis not present

## 2020-09-07 DIAGNOSIS — Z760 Encounter for issue of repeat prescription: Secondary | ICD-10-CM

## 2020-09-07 DIAGNOSIS — N3942 Incontinence without sensory awareness: Secondary | ICD-10-CM

## 2020-09-07 DIAGNOSIS — Z7689 Persons encountering health services in other specified circumstances: Secondary | ICD-10-CM | POA: Diagnosis not present

## 2020-09-07 LAB — POCT GLYCOSYLATED HEMOGLOBIN (HGB A1C): Hemoglobin A1C: 10.7 % — AB (ref 4.0–5.6)

## 2020-09-07 LAB — GLUCOSE, POCT (MANUAL RESULT ENTRY): POC Glucose: 200 mg/dl — AB (ref 70–99)

## 2020-09-07 MED ORDER — HUMALOG 100 UNIT/ML ~~LOC~~ SOCT: SUBCUTANEOUS | 3 refills | Status: DC

## 2020-09-07 MED ORDER — SITAGLIPTIN PHOSPHATE 100 MG PO TABS: 100.0000 mg | ORAL_TABLET | Freq: Every day | ORAL | 1 refills | Status: DC

## 2020-09-07 MED ORDER — METFORMIN HCL 1000 MG PO TABS: 1000.0000 mg | ORAL_TABLET | Freq: Two times a day (BID) | ORAL | 3 refills | Status: DC

## 2020-09-07 MED ORDER — CITALOPRAM HYDROBROMIDE 40 MG PO TABS: 40.0000 mg | ORAL_TABLET | Freq: Every day | ORAL | 1 refills | Status: DC

## 2020-09-07 MED ORDER — LANTUS SOLOSTAR 100 UNIT/ML ~~LOC~~ SOPN: 16.0000 [IU] | PEN_INJECTOR | Freq: Every day | SUBCUTANEOUS | 3 refills | Status: DC

## 2020-09-07 MED ORDER — OXYBUTYNIN CHLORIDE 5 MG PO TABS: 5.0000 mg | ORAL_TABLET | Freq: Three times a day (TID) | ORAL | 1 refills | Status: DC

## 2020-09-07 MED ORDER — "ADVOCATE INSULIN SYRINGE 29G X 1/2"" 0.3 ML MISC": 6 refills | Status: DC

## 2020-09-07 NOTE — Patient Instructions (Signed)
Diabetes Mellitus and Nutrition, Adult When you have diabetes, or diabetes mellitus, it is very important to have healthy eating habits because your blood sugar (glucose) levels are greatly affected by what you eat and drink. Eating healthy foods in the right amounts, at about the same times every day, can help you:  Control your blood glucose.  Lower your risk of heart disease.  Improve your blood pressure.  Reach or maintain a healthy weight. What can affect my meal plan? Every person with diabetes is different, and each person has different needs for a meal plan. Your health care provider may recommend that you work with a dietitian to make a meal plan that is best for you. Your meal plan may vary depending on factors such as:  The calories you need.  The medicines you take.  Your weight.  Your blood glucose, blood pressure, and cholesterol levels.  Your activity level.  Other health conditions you have, such as heart or kidney disease. How do carbohydrates affect me? Carbohydrates, also called carbs, affect your blood glucose level more than any other type of food. Eating carbs naturally raises the amount of glucose in your blood. Carb counting is a method for keeping track of how many carbs you eat. Counting carbs is important to keep your blood glucose at a healthy level, especially if you use insulin or take certain oral diabetes medicines. It is important to know how many carbs you can safely have in each meal. This is different for every person. Your dietitian can help you calculate how many carbs you should have at each meal and for each snack. How does alcohol affect me? Alcohol can cause a sudden decrease in blood glucose (hypoglycemia), especially if you use insulin or take certain oral diabetes medicines. Hypoglycemia can be a life-threatening condition. Symptoms of hypoglycemia, such as sleepiness, dizziness, and confusion, are similar to symptoms of having too much  alcohol.  Do not drink alcohol if: ? Your health care provider tells you not to drink. ? You are pregnant, may be pregnant, or are planning to become pregnant.  If you drink alcohol: ? Do not drink on an empty stomach. ? Limit how much you use to:  0-1 drink a day for women.  0-2 drinks a day for men. ? Be aware of how much alcohol is in your drink. In the [place] one drink equals one 12 oz bottle of beer (355 mL), one 5 oz glass of wine (148 mL), or one 1 oz glass of hard liquor (44 mL). ? Keep yourself hydrated with water, diet soda, or unsweetened iced tea.  Keep in mind that regular soda, juice, and other mixers may contain a lot of sugar and must be counted as carbs. What are tips for following this plan? Reading food labels  Start by checking the serving size on the "Nutrition Facts" label of packaged foods and drinks. The amount of calories, carbs, fats, and other nutrients listed on the label is based on one serving of the item. Many items contain more than one serving per package.  Check the total grams (g) of carbs in one serving. You can calculate the number of servings of carbs in one serving by dividing the total carbs by 15. For example, if a food has 30 g of total carbs per serving, it would be equal to 2 servings of carbs.  Check the number of grams (g) of saturated fats and trans fats in one serving. Choose foods that have   a low amount or none of these fats.  Check the number of milligrams (mg) of salt (sodium) in one serving. Most people should limit total sodium intake to less than 2,300 mg per day.  Always check the nutrition information of foods labeled as "low-fat" or "nonfat." These foods may be higher in added sugar or refined carbs and should be avoided.  Talk to your dietitian to identify your daily goals for nutrients listed on the label. Shopping  Avoid buying canned, pre-made, or processed foods. These foods tend to be high in fat, sodium, and added  sugar.  Shop around the outside edge of the grocery store. This is where you will most often find fresh fruits and vegetables, bulk grains, fresh meats, and fresh dairy. Cooking  Use low-heat cooking methods, such as baking, instead of high-heat cooking methods like deep frying.  Cook using healthy oils, such as olive, canola, or sunflower oil.  Avoid cooking with butter, cream, or high-fat meats. Meal planning  Eat meals and snacks regularly, preferably at the same times every day. Avoid going long periods of time without eating.  Eat foods that are high in fiber, such as fresh fruits, vegetables, beans, and whole grains. Talk with your dietitian about how many servings of carbs you can eat at each meal.  Eat 4-6 oz (112-168 g) of lean protein each day, such as lean meat, chicken, fish, eggs, or tofu. One ounce (oz) of lean protein is equal to: ? 1 oz (28 g) of meat, chicken, or fish. ? 1 egg. ?  cup (62 g) of tofu.  Eat some foods each day that contain healthy fats, such as avocado, nuts, seeds, and fish.   What foods should I eat? Fruits Berries. Apples. Oranges. Peaches. Apricots. Plums. Grapes. Mango. Papaya. Pomegranate. Kiwi. Cherries. Vegetables Lettuce. Spinach. Leafy greens, including kale, chard, collard greens, and mustard greens. Beets. Cauliflower. Cabbage. Broccoli. Carrots. Green beans. Tomatoes. Peppers. Onions. Cucumbers. Brussels sprouts. Grains Whole grains, such as whole-wheat or whole-grain bread, crackers, tortillas, cereal, and pasta. Unsweetened oatmeal. Quinoa. Brown or wild rice. Meats and other proteins Seafood. Poultry without skin. Lean cuts of poultry and beef. Tofu. Nuts. Seeds. Dairy Low-fat or fat-free dairy products such as milk, yogurt, and cheese. The items listed above may not be a complete list of foods and beverages you can eat. Contact a dietitian for more information. What foods should I avoid? Fruits Fruits canned with  syrup. Vegetables Canned vegetables. Frozen vegetables with butter or cream sauce. Grains Refined white flour and flour products such as bread, pasta, snack foods, and cereals. Avoid all processed foods. Meats and other proteins Fatty cuts of meat. Poultry with skin. Breaded or fried meats. Processed meat. Avoid saturated fats. Dairy Full-fat yogurt, cheese, or milk. Beverages Sweetened drinks, such as soda or iced tea. The items listed above may not be a complete list of foods and beverages you should avoid. Contact a dietitian for more information. Questions to ask a health care provider  Do I need to meet with a diabetes educator?  Do I need to meet with a dietitian?  What number can I call if I have questions?  When are the best times to check my blood glucose? Where to find more information:  American Diabetes Association: diabetes.org  Academy of Nutrition and Dietetics: www.eatright.org  National Institute of Diabetes and Digestive and Kidney Diseases: www.niddk.nih.gov  Association of Diabetes Care and Education Specialists: www.diabeteseducator.org Summary  It is important to have healthy eating   habits because your blood sugar (glucose) levels are greatly affected by what you eat and drink.  A healthy meal plan will help you control your blood glucose and maintain a healthy lifestyle.  Your health care provider may recommend that you work with a dietitian to make a meal plan that is best for you.  Keep in mind that carbohydrates (carbs) and alcohol have immediate effects on your blood glucose levels. It is important to count carbs and to use alcohol carefully. This information is not intended to replace advice given to you by your health care provider. Make sure you discuss any questions you have with your health care provider. Document Revised: 06/04/2019 Document Reviewed: 06/04/2019 Elsevier Patient Education  2021 Elsevier Inc.  

## 2020-09-07 NOTE — Progress Notes (Signed)
Established Patient Office Visit  Subjective:  Patient ID: Rhonda Hickman, female    DOB: [birth year]  Age: 65 y.o. MRN: [id number]  CC:  Chief Complaint  Patient presents with  . Diabetes    HPI Rhonda Hickman is a 65 year old female who presents for the management of type 2 diabetes DIABETES, Hypoglycemic episodes:no, Polydipsia/polyuria: yes, Visual disturbance: yes, Chest pain: no Paresthesias: yes, Glucose Monitoring: yes Accucheck frequency: BID Fasting glucose:230-400   Past Medical History:  Diagnosis Date  . Anemia   . Anxiety   . Asthma   . Atrial fibrillation ([place])   . Bipolar 1 disorder ([place])   . Bulging lumbar disc   . Chronic pain of left knee   . COPD (chronic obstructive pulmonary disease) ([place])   . CVA (cerebral vascular accident) ([place])   . Diabetes mellitus   . Neuropathy   . TIA (transient ischemic attack)   . Vertigo     Past Surgical History:  Procedure Laterality Date  . ABDOMINAL HYSTERECTOMY    . CARPAL TUNNEL RELEASE Right   . CHOLECYSTECTOMY    . KNEE ARTHROSCOPY WITH MEDIAL MENISECTOMY Right 04/14/2016   Procedure: KNEE ARTHROSCOPY WITH MEDIAL AND LATERAL MENISECTOMY, MICROFRACTURE REPAIR;  Surgeon: Carole Civil, MD;  Location: [facility];  Service: Orthopedics;  Laterality: Right;  lateral menisectomy - needs crutch training    Family History  Problem Relation Age of Onset  . Diabetes Mother   . Heart attack Father     Social History   Socioeconomic History  . Marital status: Single    Spouse name: Not on file  . Number of children: 0  . Years of education: 48  . Highest education level: High school graduate  Occupational History  . Occupation: Unemployed  Tobacco Use  . Smoking status: Current Some Day Smoker    Packs/day: 0.50    Types: Cigarettes  . Smokeless tobacco: Never Used  . Tobacco comment: tobacco info given  Vaping Use  . Vaping Use: Some days  Substance and Sexual Activity  . Alcohol use: No  . Drug  use: No  . Sexual activity: Never  Other Topics Concern  . Not on file  Social History Narrative   Lives at home alone.   Right-handed.   No caffeine use.   Social Determinants of Health   Financial Resource Strain: Not on file  Food Insecurity: Not on file  Transportation Needs: Not on file  Physical Activity: Not on file  Stress: Not on file  Social Connections: Not on file  Intimate Partner Violence: Not on file    Outpatient Medications Prior to Visit  Medication Sig Dispense Refill  . albuterol (VENTOLIN HFA) 108 (90 Base) MCG/ACT inhaler Inhale 2 puffs into the lungs every 6 (six) hours as needed for wheezing or shortness of breath. 8 g 1  . amLODipine (NORVASC) 10 MG tablet Take 1 tablet (10 mg total) by mouth daily. (Patient taking differently: Take 5 mg by mouth daily.) 90 tablet 3  . budesonide-formoterol (SYMBICORT) 160-4.5 MCG/ACT inhaler Inhale 2 puffs into the lungs 2 (two) times daily. 1 Inhaler 3  . calcium-vitamin D (OSCAL WITH D) 500-200 MG-UNIT tablet Take 1 tablet by mouth daily with breakfast. 90 tablet 1  . citalopram (CELEXA) 40 MG tablet Take 40 mg by mouth daily.    Marland Kitchen dextromethorphan-guaiFENesin (MUCINEX DM) 30-600 MG 12hr tablet Take 1 tablet by mouth 2 (two) times daily as needed for cough. 60 tablet 3  .  divalproex (DEPAKOTE ER) 500 MG 24 hr tablet Take 500-1,000 mg by mouth See admin instructions. 500MG  in the morning and 1000mg  at bedtime    . FEROSUL 325 (65 Fe) MG tablet TAKE (1) TABLET DAILY WITH BREAKFAST. 90 tablet 0  . fluticasone (FLONASE) 50 MCG/ACT nasal spray Place 2 sprays into both nostrils daily. 16 g 6  . gabapentin (NEURONTIN) 300 MG capsule Take 1 capsule (300 mg total) by mouth 3 (three) times daily. 270 capsule 11  . GNP ULTICARE PEN NEEDLES 32G X 4 MM MISC     . HYDROcodone-acetaminophen (NORCO) 10-325 MG tablet Take 1 tablet by mouth every 6 (six) hours as needed.    . insulin glargine (LANTUS SOLOSTAR) 100 UNIT/ML Solostar Pen  Inject 10 Units into the skin daily. 15 mL 3  . insulin lispro (HUMALOG) 100 UNIT/ML cartridge 0-12 units 3 times daily before meals as per sliding scale 15 mL 3  . Insulin Syringe-Needle U-100 (ADVOCATE INSULIN SYRINGE) 29G X 1/2" 0.3 ML MISC Use to inject insulin 5x daily. 200 each 6  . JANUVIA 100 MG tablet TAKE 1 TABLET DAILY 30 tablet 0  . meclizine (ANTIVERT) 12.5 MG tablet TAKE 1 TABLET 3 TIMES A DAY AS NEEDED FOR DIZZINESS 30 tablet 0  . meloxicam (MOBIC) 7.5 MG tablet TAKE 1 TABLET DAILY 30 tablet 0  . metFORMIN (GLUCOPHAGE) 1000 MG tablet Take 1 tablet (1,000 mg total) by mouth 2 (two) times daily with a meal. 180 tablet 3  . oxybutynin (DITROPAN) 5 MG tablet TAKE  (1)  TABLET TWICE A DAY. 60 tablet 1  . PEG-KCl-NaCl-NaSulf-Na Asc-C (PLENVU) 140 g SOLR Take 140 g by mouth as directed. Manufacturer's coupon Universal coupon code:BIN: P2366821; GROUP: [id number]; PCN: [id number]; ID: [id number]; PAY NO MORE $50 1 each 0  . sulfamethoxazole-trimethoprim (BACTRIM DS) 800-160 MG tablet Take 1 tablet by mouth 2 (two) times daily. 20 tablet 0  . Vitamin D, Ergocalciferol, (DRISDOL) 1.25 MG (50000 UNIT) CAPS capsule Take 1 capsule (50,000 Units total) by mouth every 7 (seven) days. 32 capsule 0  . XARELTO 20 MG TABS tablet TAKE 1 TABLET WITH SUPPER 30 tablet 0  . metoprolol tartrate (LOPRESSOR) 25 MG tablet Take 1 tablet (25 mg total) by mouth 2 (two) times daily. 180 tablet 3  . rosuvastatin (CRESTOR) 10 MG tablet Take 1 tablet (10 mg total) by mouth daily. 90 tablet 3  . phenazopyridine (PYRIDIUM) 100 MG tablet Take 1 tablet (100 mg total) by mouth 3 (three) times daily as needed for pain. 10 tablet 0   No facility-administered medications prior to visit.    Allergies  Allergen Reactions  . Aspirin Nausea Only and Other (See Comments)    Causes stomach pain    ROS Review of Systems Pertinent positive and negative noted in HPI   Objective:    Physical Exam Vitals reviewed.  HENT:      Head: Normocephalic.     Right Ear: Tympanic membrane and external ear normal.     Left Ear: Tympanic membrane and external ear normal.  Eyes:     Extraocular Movements: Extraocular movements intact.  Cardiovascular:     Rate and Rhythm: Normal rate and regular rhythm.  Pulmonary:     Effort: Pulmonary effort is normal.     Breath sounds: Normal breath sounds.  Abdominal:     General: Bowel sounds are normal.     Palpations: Abdomen is soft.  Musculoskeletal:        General: Normal  range of motion.     Cervical back: Normal range of motion and neck supple.  Skin:    General: Skin is warm and dry.  Neurological:     Mental Status: She is alert. Mental status is at baseline.     Comments: S/p stroke with aphagia with unstable gait at times   Psychiatric:        Mood and Affect: Mood normal.        Behavior: Behavior normal.        Thought Content: Thought content normal.        Judgment: Judgment normal.     BP 124/84 (BP Location: Left Arm, Patient Position: Sitting, Cuff Size: Large)   Pulse (!) 58   Temp (!) 97.3 F (36.3 C) (Temporal)   Ht 6\' 3"  (1.905 m)   Wt 292 lb 3.2 oz (132.5 kg)   SpO2 99%   BMI 36.52 kg/m  Wt Readings from Last 3 Encounters:  09/07/20 292 lb 3.2 oz (132.5 kg)  08/12/20 276 lb (125.2 kg)  08/05/20 276 lb (125.2 kg)     Health Maintenance Due  Topic Date Due  . Hepatitis C Screening  Never done  . PNEUMOCOCCAL POLYSACCHARIDE VACCINE AGE 42-64 HIGH RISK  Never done  . OPHTHALMOLOGY EXAM  Never done  . PAP SMEAR-Modifier  Never done  . COLONOSCOPY (Pts 45-47yrs Insurance coverage will need to be confirmed)  Never done  . MAMMOGRAM  Never done  . COVID-19 Vaccine (2 - Booster for Janssen series) 12/05/2019    There are no preventive care reminders to display for this patient.  Lab Results  Component Value Date   TSH 0.600 03/17/2020   Lab Results  Component Value Date   WBC 8.8 08/05/2020   HGB 12.8 08/05/2020   HCT 38.9 08/05/2020    MCV 88 08/05/2020   PLT 201 08/05/2020   Lab Results  Component Value Date   NA 139 03/17/2020   K 4.2 03/17/2020   CO2 23 03/17/2020   GLUCOSE 109 (H) 03/17/2020   BUN 15 03/17/2020   CREATININE 0.91 03/17/2020   BILITOT 0.6 03/17/2020   ALKPHOS 38 03/17/2020   AST 15 03/17/2020   ALT 15 03/17/2020   PROT 7.5 06/02/2020   ALBUMIN 3.6 03/17/2020   CALCIUM 8.8 (L) 03/17/2020   ANIONGAP 13 03/17/2020   Lab Results  Component Value Date   CHOL 116 06/08/2020   Lab Results  Component Value Date   HDL 26 (L) 06/08/2020   Lab Results  Component Value Date   LDLCALC 67 06/08/2020   Lab Results  Component Value Date   TRIG 125 06/08/2020   Lab Results  Component Value Date   CHOLHDL 4.5 (H) 06/08/2020   Lab Results  Component Value Date   HGBA1C 10.7 (A) 09/07/2020      Assessment & Plan:  Deseri was seen today for diabetes.  Diagnoses and all orders for this visit:  Type 2 diabetes mellitus with 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, with long-term current use of insulin ([place]) Uncontrolled with A1c of 10.7 3 months 10.5 ; goal is <7.0 Novolog switched from fixed regimen to sliding scale Lantus  Counseled on Diabetic diet, my plate method, 557 minutes of moderate intensity exercise/week Blood sugar logs with fasting goals of 80-120 mg/dl, random of less than 180 and in the event of sugars less than 60 mg/dl or greater than 400 mg/dl encouraged to notify the clinic. Advised on the need for annual eye exams,  annual foot exams, Pneumonia vaccine. - HgB A1c - Glucose (CBG), Fasting - insulin glargine (LANTUS SOLOSTAR) 100 UNIT/ML Solostar Pen; Inject 10 Units into the skin daily.  Dispense: 15 mL; Refill: 3 - insulin lispro (HUMALOG) 100 UNIT/ML cartridge; 0-12 units 3 times daily before meals as per sliding scale  Dispense: 15 mL; Refill: 3 -     HgB A1c -     Glucose (CBG)  Slurred speech Rissie was seen today for diabetes.  Recurrent major depressive disorder, in  remission (HCC) -     citalopram (CELEXA) 40 MG tablet; Take 1 tablet (40 mg total) by mouth daily.  Urinary incontinence without sensory awareness -     oxybutynin (DITROPAN) 5 MG tablet; Take 1 tablet (5 mg total) by mouth 3 (three) times daily. TAKE  (1)  TABLET TWICE A DAY.  Other orders -     metFORMIN (GLUCOPHAGE) 1000 MG tablet; Take 1 tablet (1,000 mg total) by mouth 2 (two) times daily with a meal.  Medication refill -     citalopram (CELEXA) 40 MG tablet; Take 1 tablet (40 mg total) by mouth daily. -     sitaGLIPtin (JANUVIA) 100 MG tablet; Take 1 tablet (100 mg total) by mouth daily. -     insulin lispro (HUMALOG) 100 UNIT/ML cartridge; 0-12 units 3 times daily before meals as per sliding scale -     metFORMIN (GLUCOPHAGE) 1000 MG tablet; Take 1 tablet (1,000 mg total) by mouth 2 (two) times daily with a meal. -     insulin glargine (LANTUS SOLOSTAR) 100 UNIT/ML Solostar Pen; Inject 16 Units into the skin at bedtime. -     oxybutynin (DITROPAN) 5 MG tablet; Take 1 tablet (5 mg total) by mouth 3 (three) times daily. TAKE  (1)  TABLET TWICE A DAY.    Follow-up: No follow-ups on file.    [name], NP

## 2020-09-09 DIAGNOSIS — E1142 Type 2 diabetes mellitus with diabetic polyneuropathy: Secondary | ICD-10-CM | POA: Diagnosis not present

## 2020-09-10 ENCOUNTER — Telehealth: Payer: Self-pay | Admitting: Primary Care

## 2020-09-10 ENCOUNTER — Ambulatory Visit (INDEPENDENT_AMBULATORY_CARE_PROVIDER_SITE_OTHER): Payer: Medicaid Other | Admitting: Gastroenterology

## 2020-09-10 VITALS — BP 124/78 | HR 75 | Ht 75.0 in | Wt 286.0 lb

## 2020-09-10 DIAGNOSIS — Z7689 Persons encountering health services in other specified circumstances: Secondary | ICD-10-CM | POA: Diagnosis not present

## 2020-09-10 DIAGNOSIS — Z1211 Encounter for screening for malignant neoplasm of colon: Secondary | ICD-10-CM

## 2020-09-10 DIAGNOSIS — E1142 Type 2 diabetes mellitus with diabetic polyneuropathy: Secondary | ICD-10-CM | POA: Diagnosis not present

## 2020-09-10 NOTE — Progress Notes (Signed)
Patient left prior to being seen. Told the staff that she did not want to have a colonoscopy.

## 2020-09-10 NOTE — Telephone Encounter (Signed)
Copied from [facility] [phone]. Topic: Quick Communication - Rx Refill/Question >> Sep 07, 2020  3:10 PM Erick Blinks wrote: Pharmacist needs call back regarding sig. For [place], [address] [address] Phone: [phone] Fax: [phone]  Stu called

## 2020-09-10 NOTE — Patient Instructions (Signed)
It was a pleasure to see you today. Based on our discussion, I am providing you with my recommendations below:  RECOMMENDATION(S):     BMI:  . If you are age 64 or younger, your body mass index should be between 19-25. Your There is no height or weight on file to calculate BMI. If this is out of the aformentioned range listed, please consider follow up with your Primary Care Provider.   Thank you for trusting me with your gastrointestinal care!    Thornton Park, MD, MPH

## 2020-09-11 DIAGNOSIS — E1142 Type 2 diabetes mellitus with diabetic polyneuropathy: Secondary | ICD-10-CM | POA: Diagnosis not present

## 2020-09-12 DIAGNOSIS — E1142 Type 2 diabetes mellitus with diabetic polyneuropathy: Secondary | ICD-10-CM | POA: Diagnosis not present

## 2020-09-13 DIAGNOSIS — E1142 Type 2 diabetes mellitus with diabetic polyneuropathy: Secondary | ICD-10-CM | POA: Diagnosis not present

## 2020-09-14 DIAGNOSIS — E1142 Type 2 diabetes mellitus with diabetic polyneuropathy: Secondary | ICD-10-CM | POA: Diagnosis not present

## 2020-09-15 DIAGNOSIS — E1142 Type 2 diabetes mellitus with diabetic polyneuropathy: Secondary | ICD-10-CM | POA: Diagnosis not present

## 2020-09-16 DIAGNOSIS — E1142 Type 2 diabetes mellitus with diabetic polyneuropathy: Secondary | ICD-10-CM | POA: Diagnosis not present

## 2020-09-17 DIAGNOSIS — E1142 Type 2 diabetes mellitus with diabetic polyneuropathy: Secondary | ICD-10-CM | POA: Diagnosis not present

## 2020-09-18 DIAGNOSIS — E1142 Type 2 diabetes mellitus with diabetic polyneuropathy: Secondary | ICD-10-CM | POA: Diagnosis not present

## 2020-09-19 DIAGNOSIS — E1142 Type 2 diabetes mellitus with diabetic polyneuropathy: Secondary | ICD-10-CM | POA: Diagnosis not present

## 2020-09-20 DIAGNOSIS — E1142 Type 2 diabetes mellitus with diabetic polyneuropathy: Secondary | ICD-10-CM | POA: Diagnosis not present

## 2020-09-21 DIAGNOSIS — Z79899 Other long term (current) drug therapy: Secondary | ICD-10-CM | POA: Diagnosis not present

## 2020-09-21 DIAGNOSIS — E1142 Type 2 diabetes mellitus with diabetic polyneuropathy: Secondary | ICD-10-CM | POA: Diagnosis not present

## 2020-09-21 DIAGNOSIS — Z7689 Persons encountering health services in other specified circumstances: Secondary | ICD-10-CM | POA: Diagnosis not present

## 2020-09-22 DIAGNOSIS — E1142 Type 2 diabetes mellitus with diabetic polyneuropathy: Secondary | ICD-10-CM | POA: Diagnosis not present

## 2020-09-23 DIAGNOSIS — E1142 Type 2 diabetes mellitus with diabetic polyneuropathy: Secondary | ICD-10-CM | POA: Diagnosis not present

## 2020-09-24 ENCOUNTER — Other Ambulatory Visit (INDEPENDENT_AMBULATORY_CARE_PROVIDER_SITE_OTHER): Payer: Self-pay | Admitting: Primary Care

## 2020-09-24 DIAGNOSIS — E1142 Type 2 diabetes mellitus with diabetic polyneuropathy: Secondary | ICD-10-CM | POA: Diagnosis not present

## 2020-09-24 NOTE — Telephone Encounter (Signed)
Sent to PCP ?

## 2020-09-25 DIAGNOSIS — E1142 Type 2 diabetes mellitus with diabetic polyneuropathy: Secondary | ICD-10-CM | POA: Diagnosis not present

## 2020-09-26 DIAGNOSIS — E1142 Type 2 diabetes mellitus with diabetic polyneuropathy: Secondary | ICD-10-CM | POA: Diagnosis not present

## 2020-09-27 DIAGNOSIS — E1142 Type 2 diabetes mellitus with diabetic polyneuropathy: Secondary | ICD-10-CM | POA: Diagnosis not present

## 2020-09-28 DIAGNOSIS — E1142 Type 2 diabetes mellitus with diabetic polyneuropathy: Secondary | ICD-10-CM | POA: Diagnosis not present

## 2020-09-29 DIAGNOSIS — E1142 Type 2 diabetes mellitus with diabetic polyneuropathy: Secondary | ICD-10-CM | POA: Diagnosis not present

## 2020-09-30 ENCOUNTER — Ambulatory Visit: Payer: Medicaid Other | Admitting: Neurology

## 2020-09-30 DIAGNOSIS — E1142 Type 2 diabetes mellitus with diabetic polyneuropathy: Secondary | ICD-10-CM | POA: Diagnosis not present

## 2020-10-01 DIAGNOSIS — E1142 Type 2 diabetes mellitus with diabetic polyneuropathy: Secondary | ICD-10-CM | POA: Diagnosis not present

## 2020-10-02 DIAGNOSIS — E1142 Type 2 diabetes mellitus with diabetic polyneuropathy: Secondary | ICD-10-CM | POA: Diagnosis not present

## 2020-10-03 DIAGNOSIS — E1142 Type 2 diabetes mellitus with diabetic polyneuropathy: Secondary | ICD-10-CM | POA: Diagnosis not present

## 2020-10-04 DIAGNOSIS — E1142 Type 2 diabetes mellitus with diabetic polyneuropathy: Secondary | ICD-10-CM | POA: Diagnosis not present

## 2020-10-05 DIAGNOSIS — E1142 Type 2 diabetes mellitus with diabetic polyneuropathy: Secondary | ICD-10-CM | POA: Diagnosis not present

## 2020-10-06 DIAGNOSIS — E1142 Type 2 diabetes mellitus with diabetic polyneuropathy: Secondary | ICD-10-CM | POA: Diagnosis not present

## 2020-10-07 ENCOUNTER — Other Ambulatory Visit: Payer: Self-pay | Admitting: Orthopedic Surgery

## 2020-10-07 DIAGNOSIS — E1142 Type 2 diabetes mellitus with diabetic polyneuropathy: Secondary | ICD-10-CM | POA: Diagnosis not present

## 2020-10-08 ENCOUNTER — Other Ambulatory Visit (INDEPENDENT_AMBULATORY_CARE_PROVIDER_SITE_OTHER): Payer: Self-pay | Admitting: Primary Care

## 2020-10-08 DIAGNOSIS — Z7689 Persons encountering health services in other specified circumstances: Secondary | ICD-10-CM | POA: Diagnosis not present

## 2020-10-08 DIAGNOSIS — Z760 Encounter for issue of repeat prescription: Secondary | ICD-10-CM

## 2020-10-08 DIAGNOSIS — E1142 Type 2 diabetes mellitus with diabetic polyneuropathy: Secondary | ICD-10-CM | POA: Diagnosis not present

## 2020-10-09 DIAGNOSIS — E1142 Type 2 diabetes mellitus with diabetic polyneuropathy: Secondary | ICD-10-CM | POA: Diagnosis not present

## 2020-10-10 DIAGNOSIS — E1142 Type 2 diabetes mellitus with diabetic polyneuropathy: Secondary | ICD-10-CM | POA: Diagnosis not present

## 2020-10-11 DIAGNOSIS — E1142 Type 2 diabetes mellitus with diabetic polyneuropathy: Secondary | ICD-10-CM | POA: Diagnosis not present

## 2020-10-12 DIAGNOSIS — E1142 Type 2 diabetes mellitus with diabetic polyneuropathy: Secondary | ICD-10-CM | POA: Diagnosis not present

## 2020-10-13 DIAGNOSIS — E1142 Type 2 diabetes mellitus with diabetic polyneuropathy: Secondary | ICD-10-CM | POA: Diagnosis not present

## 2020-10-14 DIAGNOSIS — E1142 Type 2 diabetes mellitus with diabetic polyneuropathy: Secondary | ICD-10-CM | POA: Diagnosis not present

## 2020-10-15 DIAGNOSIS — E1142 Type 2 diabetes mellitus with diabetic polyneuropathy: Secondary | ICD-10-CM | POA: Diagnosis not present

## 2020-10-16 DIAGNOSIS — E1142 Type 2 diabetes mellitus with diabetic polyneuropathy: Secondary | ICD-10-CM | POA: Diagnosis not present

## 2020-10-17 DIAGNOSIS — E1142 Type 2 diabetes mellitus with diabetic polyneuropathy: Secondary | ICD-10-CM | POA: Diagnosis not present

## 2020-10-18 DIAGNOSIS — E1142 Type 2 diabetes mellitus with diabetic polyneuropathy: Secondary | ICD-10-CM | POA: Diagnosis not present

## 2020-10-19 DIAGNOSIS — E1142 Type 2 diabetes mellitus with diabetic polyneuropathy: Secondary | ICD-10-CM | POA: Diagnosis not present

## 2020-10-20 DIAGNOSIS — E1142 Type 2 diabetes mellitus with diabetic polyneuropathy: Secondary | ICD-10-CM | POA: Diagnosis not present

## 2020-10-21 DIAGNOSIS — E1142 Type 2 diabetes mellitus with diabetic polyneuropathy: Secondary | ICD-10-CM | POA: Diagnosis not present

## 2020-10-22 ENCOUNTER — Other Ambulatory Visit (INDEPENDENT_AMBULATORY_CARE_PROVIDER_SITE_OTHER): Payer: Self-pay | Admitting: Primary Care

## 2020-10-22 DIAGNOSIS — E1142 Type 2 diabetes mellitus with diabetic polyneuropathy: Secondary | ICD-10-CM | POA: Diagnosis not present

## 2020-10-22 DIAGNOSIS — Z79899 Other long term (current) drug therapy: Secondary | ICD-10-CM | POA: Diagnosis not present

## 2020-10-22 DIAGNOSIS — Z7689 Persons encountering health services in other specified circumstances: Secondary | ICD-10-CM | POA: Diagnosis not present

## 2020-10-23 DIAGNOSIS — E1142 Type 2 diabetes mellitus with diabetic polyneuropathy: Secondary | ICD-10-CM | POA: Diagnosis not present

## 2020-10-24 DIAGNOSIS — E1142 Type 2 diabetes mellitus with diabetic polyneuropathy: Secondary | ICD-10-CM | POA: Diagnosis not present

## 2020-10-26 DIAGNOSIS — E1142 Type 2 diabetes mellitus with diabetic polyneuropathy: Secondary | ICD-10-CM | POA: Diagnosis not present

## 2020-10-27 DIAGNOSIS — E1142 Type 2 diabetes mellitus with diabetic polyneuropathy: Secondary | ICD-10-CM | POA: Diagnosis not present

## 2020-10-28 DIAGNOSIS — E1142 Type 2 diabetes mellitus with diabetic polyneuropathy: Secondary | ICD-10-CM | POA: Diagnosis not present

## 2020-10-29 DIAGNOSIS — E1142 Type 2 diabetes mellitus with diabetic polyneuropathy: Secondary | ICD-10-CM | POA: Diagnosis not present

## 2020-10-30 DIAGNOSIS — E1142 Type 2 diabetes mellitus with diabetic polyneuropathy: Secondary | ICD-10-CM | POA: Diagnosis not present

## 2020-10-30 DIAGNOSIS — M170 Bilateral primary osteoarthritis of knee: Secondary | ICD-10-CM | POA: Diagnosis not present

## 2020-10-30 DIAGNOSIS — Z7689 Persons encountering health services in other specified circumstances: Secondary | ICD-10-CM | POA: Diagnosis not present

## 2020-10-31 DIAGNOSIS — E1142 Type 2 diabetes mellitus with diabetic polyneuropathy: Secondary | ICD-10-CM | POA: Diagnosis not present

## 2020-11-01 DIAGNOSIS — E1142 Type 2 diabetes mellitus with diabetic polyneuropathy: Secondary | ICD-10-CM | POA: Diagnosis not present

## 2020-11-02 DIAGNOSIS — E1142 Type 2 diabetes mellitus with diabetic polyneuropathy: Secondary | ICD-10-CM | POA: Diagnosis not present

## 2020-11-03 DIAGNOSIS — E1142 Type 2 diabetes mellitus with diabetic polyneuropathy: Secondary | ICD-10-CM | POA: Diagnosis not present

## 2020-11-04 ENCOUNTER — Ambulatory Visit: Payer: Medicaid Other | Admitting: Gastroenterology

## 2020-11-04 ENCOUNTER — Other Ambulatory Visit (INDEPENDENT_AMBULATORY_CARE_PROVIDER_SITE_OTHER): Payer: Self-pay | Admitting: Primary Care

## 2020-11-04 DIAGNOSIS — Z7689 Persons encountering health services in other specified circumstances: Secondary | ICD-10-CM | POA: Diagnosis not present

## 2020-11-04 DIAGNOSIS — E1142 Type 2 diabetes mellitus with diabetic polyneuropathy: Secondary | ICD-10-CM | POA: Diagnosis not present

## 2020-11-04 NOTE — Telephone Encounter (Signed)
Requested medication (s) are due for refill today: Yes  Requested medication (s) are on the active medication list: Yes  Last refill:  2019  Future visit scheduled: Yes  Notes to clinic:  Unable to refill per protocol, cannot delegate.      Requested Prescriptions  Pending Prescriptions Disp Refills   divalproex (DEPAKOTE ER) 500 MG 24 hr tablet [Pharmacy Med Name: divalproex ER 500 mg tablet,extended release 24 hr] 90 tablet 1    Sig: TAKE 1 TABLET EVERY MORNING AND 2 TABLETS AT BEDTIME      Not Delegated - Neurology:  Anticonvulsants - Valproates Failed - 11/04/2020  3:33 PM      Failed - This refill cannot be delegated      Failed - Valproic Acid (serum) in normal range and within 360 days    Valproic Acid Lvl  Date Value Ref Range Status  06/02/2020 49 (L) 50 - 100 ug/mL Final    Comment:                                    Detection Limit = 4                            <4 indicates None Detected Toxicity may occur at levels of 100-500. Measurements of free unbound valproic acid may improve the assess- ment of clinical response.           Passed - AST in normal range and within 360 days    AST  Date Value Ref Range Status  03/17/2020 15 15 - 41 U/L Final          Passed - ALT in normal range and within 360 days    ALT  Date Value Ref Range Status  03/17/2020 15 0 - 44 U/L Final          Passed - HGB in normal range and within 360 days    Hemoglobin  Date Value Ref Range Status  08/05/2020 12.8 11.1 - 15.9 g/dL Final          Passed - PLT in normal range and within 360 days    Platelets  Date Value Ref Range Status  08/05/2020 201 150 - 450 x10E3/uL Final          Passed - WBC in normal range and within 360 days    WBC  Date Value Ref Range Status  08/05/2020 8.8 3.4 - 10.8 x10E3/uL Final  03/17/2020 10.2 4.0 - 10.5 K/uL Final          Passed - HCT in normal range and within 360 days    Hematocrit  Date Value Ref Range Status  08/05/2020  38.9 34.0 - 46.6 % Final          Passed - Valid encounter within last 12 months    Recent Outpatient Visits           1 month ago Type 2 diabetes mellitus with 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, with long-term current use of insulin ([place])   [place] [facility] [name] P, NP   4 months ago Type 2 diabetes mellitus with 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, with long-term current use of insulin ([place])   [facility] [name], NP   6 months ago Dysuria   [place] [name], NP  7 months ago Fall, subsequent encounter   [place], [place] P, NP   8 months ago Type 2 diabetes mellitus with 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, with long-term current use of insulin ([place])   [place], [place], MD       Future Appointments             In 1 week Oletta Lamas [place] Cage, NP [place]   In 1 month Oletta Lamas [place] Cage, NP [place]   In 2 months [name], MD [place] [facility]

## 2020-11-05 DIAGNOSIS — E1142 Type 2 diabetes mellitus with diabetic polyneuropathy: Secondary | ICD-10-CM | POA: Diagnosis not present

## 2020-11-06 DIAGNOSIS — E1142 Type 2 diabetes mellitus with diabetic polyneuropathy: Secondary | ICD-10-CM | POA: Diagnosis not present

## 2020-11-07 DIAGNOSIS — E1142 Type 2 diabetes mellitus with diabetic polyneuropathy: Secondary | ICD-10-CM | POA: Diagnosis not present

## 2020-11-09 ENCOUNTER — Other Ambulatory Visit: Payer: Self-pay | Admitting: Neurology

## 2020-11-09 ENCOUNTER — Other Ambulatory Visit (INDEPENDENT_AMBULATORY_CARE_PROVIDER_SITE_OTHER): Payer: Self-pay | Admitting: Primary Care

## 2020-11-09 NOTE — Telephone Encounter (Signed)
Divalproex is not provided to the patient by her pcp, it has been prescribed in the past by her neurologist. Please forward request to her neurologist-pcp office is refusing the request.

## 2020-11-10 ENCOUNTER — Other Ambulatory Visit: Payer: Self-pay | Admitting: Orthopedic Surgery

## 2020-11-11 DIAGNOSIS — Z79899 Other long term (current) drug therapy: Secondary | ICD-10-CM | POA: Diagnosis not present

## 2020-11-11 DIAGNOSIS — G8929 Other chronic pain: Secondary | ICD-10-CM | POA: Diagnosis not present

## 2020-11-11 DIAGNOSIS — M5416 Radiculopathy, lumbar region: Secondary | ICD-10-CM | POA: Diagnosis not present

## 2020-11-12 ENCOUNTER — Ambulatory Visit (INDEPENDENT_AMBULATORY_CARE_PROVIDER_SITE_OTHER): Payer: Medicaid Other | Admitting: Primary Care

## 2020-11-12 DIAGNOSIS — Z7689 Persons encountering health services in other specified circumstances: Secondary | ICD-10-CM | POA: Diagnosis not present

## 2020-11-13 DIAGNOSIS — G459 Transient cerebral ischemic attack, unspecified: Secondary | ICD-10-CM | POA: Diagnosis not present

## 2020-11-14 DIAGNOSIS — G459 Transient cerebral ischemic attack, unspecified: Secondary | ICD-10-CM | POA: Diagnosis not present

## 2020-11-15 DIAGNOSIS — G459 Transient cerebral ischemic attack, unspecified: Secondary | ICD-10-CM | POA: Diagnosis not present

## 2020-11-16 DIAGNOSIS — G459 Transient cerebral ischemic attack, unspecified: Secondary | ICD-10-CM | POA: Diagnosis not present

## 2020-11-17 DIAGNOSIS — G459 Transient cerebral ischemic attack, unspecified: Secondary | ICD-10-CM | POA: Diagnosis not present

## 2020-11-18 DIAGNOSIS — G459 Transient cerebral ischemic attack, unspecified: Secondary | ICD-10-CM | POA: Diagnosis not present

## 2020-11-19 DIAGNOSIS — G459 Transient cerebral ischemic attack, unspecified: Secondary | ICD-10-CM | POA: Diagnosis not present

## 2020-11-20 DIAGNOSIS — G459 Transient cerebral ischemic attack, unspecified: Secondary | ICD-10-CM | POA: Diagnosis not present

## 2020-11-21 DIAGNOSIS — G459 Transient cerebral ischemic attack, unspecified: Secondary | ICD-10-CM | POA: Diagnosis not present

## 2020-11-22 DIAGNOSIS — G459 Transient cerebral ischemic attack, unspecified: Secondary | ICD-10-CM | POA: Diagnosis not present

## 2020-11-23 DIAGNOSIS — G459 Transient cerebral ischemic attack, unspecified: Secondary | ICD-10-CM | POA: Diagnosis not present

## 2020-11-24 DIAGNOSIS — G459 Transient cerebral ischemic attack, unspecified: Secondary | ICD-10-CM | POA: Diagnosis not present

## 2020-11-25 DIAGNOSIS — G459 Transient cerebral ischemic attack, unspecified: Secondary | ICD-10-CM | POA: Diagnosis not present

## 2020-11-26 DIAGNOSIS — G459 Transient cerebral ischemic attack, unspecified: Secondary | ICD-10-CM | POA: Diagnosis not present

## 2020-11-27 DIAGNOSIS — G459 Transient cerebral ischemic attack, unspecified: Secondary | ICD-10-CM | POA: Diagnosis not present

## 2020-11-28 DIAGNOSIS — G459 Transient cerebral ischemic attack, unspecified: Secondary | ICD-10-CM | POA: Diagnosis not present

## 2020-11-29 DIAGNOSIS — G459 Transient cerebral ischemic attack, unspecified: Secondary | ICD-10-CM | POA: Diagnosis not present

## 2020-11-30 DIAGNOSIS — G459 Transient cerebral ischemic attack, unspecified: Secondary | ICD-10-CM | POA: Diagnosis not present

## 2020-12-01 DIAGNOSIS — G459 Transient cerebral ischemic attack, unspecified: Secondary | ICD-10-CM | POA: Diagnosis not present

## 2020-12-02 DIAGNOSIS — G459 Transient cerebral ischemic attack, unspecified: Secondary | ICD-10-CM | POA: Diagnosis not present

## 2020-12-03 DIAGNOSIS — G459 Transient cerebral ischemic attack, unspecified: Secondary | ICD-10-CM | POA: Diagnosis not present

## 2020-12-04 ENCOUNTER — Ambulatory Visit (INDEPENDENT_AMBULATORY_CARE_PROVIDER_SITE_OTHER): Payer: Medicaid Other | Admitting: Family Medicine

## 2020-12-04 DIAGNOSIS — G459 Transient cerebral ischemic attack, unspecified: Secondary | ICD-10-CM | POA: Diagnosis not present

## 2020-12-05 DIAGNOSIS — G459 Transient cerebral ischemic attack, unspecified: Secondary | ICD-10-CM | POA: Diagnosis not present

## 2020-12-06 DIAGNOSIS — G459 Transient cerebral ischemic attack, unspecified: Secondary | ICD-10-CM | POA: Diagnosis not present

## 2020-12-07 DIAGNOSIS — G459 Transient cerebral ischemic attack, unspecified: Secondary | ICD-10-CM | POA: Diagnosis not present

## 2020-12-08 ENCOUNTER — Other Ambulatory Visit: Payer: Self-pay

## 2020-12-08 ENCOUNTER — Other Ambulatory Visit: Payer: Self-pay | Admitting: Neurology

## 2020-12-08 ENCOUNTER — Ambulatory Visit (INDEPENDENT_AMBULATORY_CARE_PROVIDER_SITE_OTHER): Payer: Medicaid Other | Admitting: Primary Care

## 2020-12-08 ENCOUNTER — Encounter (INDEPENDENT_AMBULATORY_CARE_PROVIDER_SITE_OTHER): Payer: Self-pay | Admitting: Primary Care

## 2020-12-08 VITALS — BP 112/73 | HR 60 | Temp 97.5°F | Ht 75.0 in | Wt 273.4 lb

## 2020-12-08 DIAGNOSIS — E1100 Type 2 diabetes mellitus with hyperosmolarity without nonketotic hyperglycemic-hyperosmolar coma (NKHHC): Secondary | ICD-10-CM

## 2020-12-08 DIAGNOSIS — R300 Dysuria: Secondary | ICD-10-CM | POA: Diagnosis not present

## 2020-12-08 DIAGNOSIS — Z1231 Encounter for screening mammogram for malignant neoplasm of breast: Secondary | ICD-10-CM

## 2020-12-08 DIAGNOSIS — Z794 Long term (current) use of insulin: Secondary | ICD-10-CM

## 2020-12-08 DIAGNOSIS — Z1211 Encounter for screening for malignant neoplasm of colon: Secondary | ICD-10-CM

## 2020-12-08 DIAGNOSIS — Z7689 Persons encountering health services in other specified circumstances: Secondary | ICD-10-CM | POA: Diagnosis not present

## 2020-12-08 DIAGNOSIS — G459 Transient cerebral ischemic attack, unspecified: Secondary | ICD-10-CM | POA: Diagnosis not present

## 2020-12-08 DIAGNOSIS — Z760 Encounter for issue of repeat prescription: Secondary | ICD-10-CM

## 2020-12-08 DIAGNOSIS — R4586 Emotional lability: Secondary | ICD-10-CM

## 2020-12-08 LAB — POCT GLYCOSYLATED HEMOGLOBIN (HGB A1C): Hemoglobin A1C: 9.4 % — AB (ref 4.0–5.6)

## 2020-12-08 LAB — GLUCOSE, POCT (MANUAL RESULT ENTRY): POC Glucose: 226 mg/dl — AB (ref 70–99)

## 2020-12-08 LAB — POCT URINALYSIS DIP (CLINITEK)
Bilirubin, UA: NEGATIVE
Glucose, UA: 100 mg/dL — AB
Ketones, POC UA: NEGATIVE mg/dL
Nitrite, UA: POSITIVE — AB
POC PROTEIN,UA: 30 — AB
Spec Grav, UA: 1.025 (ref 1.010–1.025)
Urobilinogen, UA: 0.2 E.U./dL
pH, UA: 5 (ref 5.0–8.0)

## 2020-12-08 MED ORDER — LANTUS SOLOSTAR 100 UNIT/ML ~~LOC~~ SOPN: 20.0000 [IU] | PEN_INJECTOR | Freq: Every day | SUBCUTANEOUS | 3 refills | Status: DC

## 2020-12-08 MED ORDER — PHENAZOPYRIDINE HCL 100 MG PO TABS: 100.0000 mg | ORAL_TABLET | Freq: Three times a day (TID) | ORAL | 0 refills | Status: DC | PRN

## 2020-12-09 ENCOUNTER — Other Ambulatory Visit (INDEPENDENT_AMBULATORY_CARE_PROVIDER_SITE_OTHER): Payer: Self-pay | Admitting: Primary Care

## 2020-12-09 DIAGNOSIS — M5416 Radiculopathy, lumbar region: Secondary | ICD-10-CM | POA: Diagnosis not present

## 2020-12-09 DIAGNOSIS — G459 Transient cerebral ischemic attack, unspecified: Secondary | ICD-10-CM | POA: Diagnosis not present

## 2020-12-09 DIAGNOSIS — G8929 Other chronic pain: Secondary | ICD-10-CM | POA: Diagnosis not present

## 2020-12-09 DIAGNOSIS — Z79899 Other long term (current) drug therapy: Secondary | ICD-10-CM | POA: Diagnosis not present

## 2020-12-10 ENCOUNTER — Encounter: Payer: Self-pay | Admitting: Neurology

## 2020-12-10 ENCOUNTER — Other Ambulatory Visit: Payer: Self-pay

## 2020-12-10 ENCOUNTER — Other Ambulatory Visit: Payer: Self-pay | Admitting: *Deleted

## 2020-12-10 ENCOUNTER — Ambulatory Visit: Payer: Medicaid Other | Admitting: Neurology

## 2020-12-10 VITALS — BP 125/74 | HR 56 | Ht 72.0 in | Wt 273.0 lb

## 2020-12-10 DIAGNOSIS — Z7689 Persons encountering health services in other specified circumstances: Secondary | ICD-10-CM | POA: Diagnosis not present

## 2020-12-10 DIAGNOSIS — R269 Unspecified abnormalities of gait and mobility: Secondary | ICD-10-CM

## 2020-12-10 DIAGNOSIS — R42 Dizziness and giddiness: Secondary | ICD-10-CM | POA: Diagnosis not present

## 2020-12-10 DIAGNOSIS — E1142 Type 2 diabetes mellitus with diabetic polyneuropathy: Secondary | ICD-10-CM

## 2020-12-10 DIAGNOSIS — G459 Transient cerebral ischemic attack, unspecified: Secondary | ICD-10-CM | POA: Diagnosis not present

## 2020-12-10 MED ORDER — GABAPENTIN 300 MG PO CAPS: 300.0000 mg | ORAL_CAPSULE | Freq: Three times a day (TID) | ORAL | 6 refills | Status: DC

## 2020-12-10 NOTE — Patient Instructions (Signed)
Refill sent in for gabapentin  Get back with psychiatry for refill of Depakote Keep other medications See you back in 6-8 months with Dr. Krista Blue

## 2020-12-10 NOTE — Progress Notes (Signed)
Chief Complaint  Patient presents with  . Follow-up    New rm, alone, states HER NEUROPATHY is worse, she sometimes forgets to take her gabapentin      HISTORICAL  Rhonda Hickman is a 65 year old female, seen in request by her cardiologist Dr. [name], and the primary care nurse practitioner [name] P for evaluation of dizziness, related to positional change, I saw her previously in March 2020, she is accompanied by her friend at today's clinical visit.  I reviewed and summarized the referring note.  Past medical history Hypertension Diabetes, insulin-dependent, poorly controlled, A1c 8.3 Hyperlipidemia Bipolar disorder, on Depakote 1000 mg daily, Celexa 40 mg daily, Atrial fibrillation, taking Xarelto  Previous evaluation after she fell on same level ground, passed out, bumped her head on the concrete floor, also described erratic behavior during interview, she also complains of headaches I personally reviewed CT head without contrast September 2021:No acute intracranial abnormality, mild supratentorium small vessel disease  MRI of the brain in January 2021, motion degraded examination, there was no positive DWI lesion, moderate patchy T2/flair hyperdensity at cerebral white matter, consistent with chronic small vessel disease  Echocardiogram, ejection fraction 60 to 65%, no regional wall motion abnormality.  Laboratory evaluations in 2021, CMP showed elevated creatinine 1.4, glucose of 150, CBC showed hemoglobin of 11.9, mild elevated RDW of 15.6, A1c 8.9, was 11.5 in November 2019, has been persistently above 8.5 over the past couple years, lipid panel triglyceride 161, LDL 101, negative HIV, normal B12, TSH  During today's interview, she falling to sleep during conversation, blood pressure lying down 145/78, heart rate of 49; sitting up 127/84, heart rate of 66; standing 160/90, 72 Patient is a poor historian, complains of dizziness all the time,  regardless of position, it can happen lying down, sitting up and standing position,  Update December 10, 2020 SS: Here today alone, mentions intermittent swelling in left hand, now in the right hand several months. Takes gabapentin for neuropathy in feet, ortho sent for therapy for right hand, she didn't go. Living alone, got here by medical transportation. Wants to get knee replacement, too many risk factors right now. Dizziness is better, but if she bends over, does feel dizzy like she may pass out, but nothing recent. Mood not doing well, ups and downs. Claims past due to see psych, Depakote ran out for 1 month. Trying to stop smoking.  In November 2021, extensive laboratory evaluation (drug screen, sed rate, CRP, B12, RPR, ANA, vitamin D, multiple myeloma panel, Depakote) were unremarkable.  MRI of the brain in December 2021 showed mild age-related changes, no acute abnormalities.  In November 2021, A1c was 10.5, 10.7 in February 2022, 9.4 in May 2022  Less than 39% stenosis of bilateral internal carotid artery July 2021  On Xarelto for history AFIB. Saw Cardiology Jan 2022, dizziness unclear cause, orthostatics negative, Zio patch 3 days Nov 2021, unremarkable. Sleep study showed mild OSA, no indication for CPAP at this time.  REVIEW OF SYSTEMS: Full 14 system review of systems performed and notable only for as above  See HPI  ALLERGIES: Allergies  Allergen Reactions  . Aspirin Nausea Only and Other (See Comments)    Causes stomach pain    HOME MEDICATIONS: Current Outpatient Medications  Medication Sig Dispense Refill  . albuterol (VENTOLIN HFA) 108 (90 Base) MCG/ACT inhaler Inhale 2 puffs into the lungs every 6 (six) hours as needed for wheezing or shortness of breath. 8 g 1  . amLODipine (  NORVASC) 10 MG tablet TAKE 1 TABLET DAILY 90 tablet 3  . budesonide-formoterol (SYMBICORT) 160-4.5 MCG/ACT inhaler Inhale 2 puffs into the lungs 2 (two) times daily. 1 Inhaler 3  . calcium-vitamin  D (OSCAL WITH D) 500-200 MG-UNIT tablet TAKE (1) TABLET DAILY WITH BREAKFAST. 90 tablet 1  . citalopram (CELEXA) 40 MG tablet Take 1 tablet (40 mg total) by mouth daily. 90 tablet 1  . divalproex (DEPAKOTE ER) 500 MG 24 hr tablet Take 500-1,000 mg by mouth See admin instructions. 500MG in the morning and 1027m at bedtime    . FEROSUL 325 (65 Fe) MG tablet TAKE (1) TABLET DAILY WITH BREAKFAST. 90 tablet 0  . fluticasone (FLONASE) 50 MCG/ACT nasal spray Place 2 sprays into both nostrils daily. 16 g 6  . gabapentin (NEURONTIN) 300 MG capsule Take 1 capsule (300 mg total) by mouth 3 (three) times daily. (Patient taking differently: Take 300 mg by mouth 3 (three) times daily. States she takes 3 tabs TID) 270 capsule 11  . GNP ULTICARE PEN NEEDLES 32G X 4 MM MISC     . HYDROcodone-acetaminophen (NORCO) 10-325 MG tablet Take 1 tablet by mouth every 6 (six) hours as needed.    . insulin glargine (LANTUS SOLOSTAR) 100 UNIT/ML Solostar Pen Inject 20 Units into the skin at bedtime. 15 mL 3  . insulin lispro (HUMALOG) 100 UNIT/ML cartridge 0-12 units 3 times daily before meals as per sliding scale 15 mL 3  . Insulin Syringe-Needle U-100 (ADVOCATE INSULIN SYRINGE) 29G X 1/2" 0.3 ML MISC Use to inject insulin 5x daily. 200 each 6  . meclizine (ANTIVERT) 12.5 MG tablet TAKE 1 TABLET 3 TIMES A DAY AS NEEDED FOR DIZZINESS 30 tablet 0  . meloxicam (MOBIC) 7.5 MG tablet TAKE 1 TABLET DAILY 30 tablet 0  . metFORMIN (GLUCOPHAGE) 1000 MG tablet Take 1 tablet (1,000 mg total) by mouth 2 (two) times daily with a meal. 180 tablet 3  . oxybutynin (DITROPAN) 5 MG tablet Take 1 tablet (5 mg total) by mouth 3 (three) times daily. TAKE  (1)  TABLET TWICE A DAY. 270 tablet 1  . PEG-KCl-NaCl-NaSulf-Na Asc-C (PLENVU) 140 g SOLR Take 140 g by mouth as directed. Manufacturer's coupon Universal coupon code:BIN: 0P2366821 GROUP:: [id number] PCN: [id number]; ID:: [id number] PAY NO MORE $50 1 each 0  . phenazopyridine (PYRIDIUM) 100 MG tablet  Take 1 tablet (100 mg total) by mouth 3 (three) times daily as needed for pain. 10 tablet 0  . sitaGLIPtin (JANUVIA) 100 MG tablet Take 1 tablet (100 mg total) by mouth daily. 90 tablet 1  . sulfamethoxazole-trimethoprim (BACTRIM DS) 800-160 MG tablet Take 1 tablet by mouth 2 (two) times daily. 20 tablet 0  . Vitamin D, Ergocalciferol, (DRISDOL) 1.25 MG (50000 UNIT) CAPS capsule Take 1 capsule (50,000 Units total) by mouth every 7 (seven) days. 32 capsule 0  . XARELTO 20 MG TABS tablet TAKE 1 TABLET WITH SUPPER 30 tablet 1  . metoprolol tartrate (LOPRESSOR) 25 MG tablet Take 1 tablet (25 mg total) by mouth 2 (two) times daily. 180 tablet 3  . rosuvastatin (CRESTOR) 10 MG tablet Take 1 tablet (10 mg total) by mouth daily. 90 tablet 3   No current facility-administered medications for this visit.    PAST MEDICAL HISTORY: Past Medical History:  Diagnosis Date  . Anemia   . Anxiety   . Asthma   . Atrial fibrillation ([place]   . Bipolar 1 disorder ([place]   . Bulging lumbar disc   .  Chronic pain of left knee   . COPD (chronic obstructive pulmonary disease) ([place])   . CVA (cerebral vascular accident) ([place])   . Diabetes mellitus   . Neuropathy   . TIA (transient ischemic attack)   . Vertigo     PAST SURGICAL HISTORY: Past Surgical History:  Procedure Laterality Date  . ABDOMINAL HYSTERECTOMY    . CARPAL TUNNEL RELEASE Right   . CHOLECYSTECTOMY    . KNEE ARTHROSCOPY WITH MEDIAL MENISECTOMY Right 04/14/2016   Procedure: KNEE ARTHROSCOPY WITH MEDIAL AND LATERAL MENISECTOMY, MICROFRACTURE REPAIR;  Surgeon: Carole Civil, MD;  Location: [facility];  Service: Orthopedics;  Laterality: Right;  lateral menisectomy - needs crutch training    FAMILY HISTORY: Family History  Problem Relation Age of Onset  . Diabetes Mother   . Heart attack Father     SOCIAL HISTORY: Social History   Socioeconomic History  . Marital status: Single    Spouse name: Not on file  . Number of children: 0  .  Years of education: 42  . Highest education level: High school graduate  Occupational History  . Occupation: Unemployed  Tobacco Use  . Smoking status: Current Some Day Smoker    Packs/day: 0.50    Types: Cigarettes  . Smokeless tobacco: Never Used  . Tobacco comment: tobacco info given  Vaping Use  . Vaping Use: Some days  Substance and Sexual Activity  . Alcohol use: No  . Drug use: No  . Sexual activity: Never  Other Topics Concern  . Not on file  Social History Narrative   Lives at home alone.   Right-handed.   No caffeine use.   Social Determinants of Health   Financial Resource Strain: Not on file  Food Insecurity: Not on file  Transportation Needs: Not on file  Physical Activity: Not on file  Stress: Not on file  Social Connections: Not on file  Intimate Partner Violence: Not on file   PHYSICAL EXAM   Vitals:   12/10/20 0752  BP: 125/74  Pulse: (!) 56  Weight: 273 lb (123.8 kg)  Height: 6' (1.829 m)   Body mass index is 37.03 kg/m.  PHYSICAL EXAMNIATION:  Physical Exam  General: The patient is alert and cooperative at the time of the examination. Speech is erratic, periods of normal, then slows and slurs, sometimes childlike  Skin: No significant peripheral edema is noted.  Neurologic Exam  Mental status: The patient is alert and oriented x 3 at the time of the examination. The patient has apparent normal recent and remote memory, with an apparently normal attention span and concentration ability.  Cranial nerves: Facial symmetry is present. Extraocular movements are full, pupils equal round and reactive. Visual fields are full.  Motor: The patient has good strength in all 4 extremities.  Sensory examination: Soft touch sensation is symmetric on the face, arms, and legs.  Coordination: The patient has good finger-nose-finger and heel-to-shin bilaterally.  Gait and station: Antalgic gait, slightly wide-based, but independent  Reflexes: Deep  tendon reflexes are symmetric.  DIAGNOSTIC DATA (LABS, IMAGING, TESTING) - I reviewed patient records, labs, notes, testing and imaging myself where available.   ASSESSMENT AND PLAN  Rhonda Hickman is a 65 y.o. female   1. Unsteady gait, dizziness  -For now, issues seem to be stable, of most concern, need to get back in with psychiatry to get back on Depakote, mood is up-and-down; extensive work-up has been relatively unrevealing  -Refill gabapentin 300 mg 3 times daily  for neuropathy pain lower extremities, seeing ortho, not a candidate for knee replacement due to poorly controlled diabetes, tobacco abuse, also on Xarelto for Afib  -In November 2021, extensive laboratory evaluation (drug screen, sed rate, CRP, B12, RPR, ANA, vitamin D, multiple myeloma panel, Depakote) were unremarkable.  -MRI of the brain in December 2021 showed mild age-related changes, no acute abnormalities.  -In November 2021, A1c was 10.5, 10.7 in February 2022, 9.4 in May 2022   -Less than 39% stenosis of bilateral internal carotid artery July 2021; on Xarelto history of AFIB  -Have encouraged increased water intake, better control of diabetes, poor control likely contributing to chronic gait issues, but also underlying mood disorder certainly a factor in overall function  -Follow-up in 6 to 8 months or sooner if needed  I spent 32 minutes of face-to-face and non-face-to-face time with patient.  This included previsit chart review, lab review, discussing medications, discussing need to get back in with psychiatry to reestablish on Depakote, reviewing extensive work-up the last several visits, comorbidities likely affecting current health, encouragement for smoking cessation and better glycemic control for overall better management of chronic conditions.  Evangeline Dakin, DNP  [facility] [address] [place], [place] [address] [phone]

## 2020-12-11 ENCOUNTER — Other Ambulatory Visit: Payer: Self-pay | Admitting: Orthopedic Surgery

## 2020-12-11 ENCOUNTER — Other Ambulatory Visit (INDEPENDENT_AMBULATORY_CARE_PROVIDER_SITE_OTHER): Payer: Self-pay | Admitting: Primary Care

## 2020-12-11 DIAGNOSIS — N3000 Acute cystitis without hematuria: Secondary | ICD-10-CM

## 2020-12-11 DIAGNOSIS — G459 Transient cerebral ischemic attack, unspecified: Secondary | ICD-10-CM | POA: Diagnosis not present

## 2020-12-11 DIAGNOSIS — F319 Bipolar disorder, unspecified: Secondary | ICD-10-CM | POA: Diagnosis not present

## 2020-12-11 DIAGNOSIS — F329 Major depressive disorder, single episode, unspecified: Secondary | ICD-10-CM | POA: Diagnosis not present

## 2020-12-11 LAB — URINE CULTURE

## 2020-12-11 MED ORDER — FLUCONAZOLE 150 MG PO TABS: 150.0000 mg | ORAL_TABLET | Freq: Every day | ORAL | 1 refills | Status: DC

## 2020-12-11 MED ORDER — SULFAMETHOXAZOLE-TRIMETHOPRIM 800-160 MG PO TABS: 1.0000 | ORAL_TABLET | Freq: Two times a day (BID) | ORAL | 0 refills | Status: DC

## 2020-12-12 DIAGNOSIS — G459 Transient cerebral ischemic attack, unspecified: Secondary | ICD-10-CM | POA: Diagnosis not present

## 2020-12-13 DIAGNOSIS — G459 Transient cerebral ischemic attack, unspecified: Secondary | ICD-10-CM | POA: Diagnosis not present

## 2020-12-13 NOTE — Progress Notes (Addendum)
Established Patient Office Visit  Subjective:  Patient ID: Rhonda Hickman, female    DOB: 19-May-1956  Age: 65 y.o. MRN: [id number]  CC:  Chief Complaint  Patient presents with   Diabetes  Patient was connected through an audio and video application or by telephone and verified that I am speaking with the correct person using two identifiers.   Consent: I discussed the limitations, risks, security and privacy concerns of performing an evaluation and management service by telephone and the availability of in person appointments. I also discussed with the patient that there may be a patient responsible charge related to this service. The patient expressed understanding and agreed to proceed.   Location of Patient: car  Location of Provider: [facility]   Persons participating in Telemedicine visit: Mauricio Po,  NP Llana Aliment , CMA   HPI Rhonda Hickman is a 65 year old obese female who presents for the management of type 2 diabetes-she denies polyuria, polydipsia, polyphagia and visual changes.  Her blood pressure is unremarkable she denies level was shortness of breath, headaches, chest pain or lower extremity edema,. Patient arrived to her appointment 2 hours early and she is transferred by [facility] and visit was incomplete annual physical exam done completed by tele  Past Medical History:  Diagnosis Date   Anemia    Anxiety    Asthma    Atrial fibrillation ([place])    Bipolar 1 disorder ([place])    Bulging lumbar disc    Chronic pain of left knee    COPD (chronic obstructive pulmonary disease) ([place])    CVA (cerebral vascular accident) ([place])    Diabetes mellitus    Neuropathy    TIA (transient ischemic attack)    Vertigo     Past Surgical History:  Procedure Laterality Date   ABDOMINAL HYSTERECTOMY     CARPAL TUNNEL RELEASE Right    CHOLECYSTECTOMY     KNEE ARTHROSCOPY WITH MEDIAL MENISECTOMY  Right 04/14/2016   Procedure: KNEE ARTHROSCOPY WITH MEDIAL AND LATERAL MENISECTOMY, MICROFRACTURE REPAIR;  Surgeon: Carole Civil, MD;  Location: [facility];  Service: Orthopedics;  Laterality: Right;  lateral menisectomy - needs crutch training    Family History  Problem Relation Age of Onset   Diabetes Mother    Heart attack Father     Social History   Socioeconomic History   Marital status: Single    Spouse name: Not on file   Number of children: 0   Years of education: 12   Highest education level: High school graduate  Occupational History   Occupation: Unemployed  Tobacco Use   Smoking status: Current Some Day Smoker    Packs/day: 0.50    Types: Cigarettes   Smokeless tobacco: Never Used   Tobacco comment: tobacco info given  Vaping Use   Vaping Use: Some days  Substance and Sexual Activity   Alcohol use: No   Drug use: No   Sexual activity: Never  Other Topics Concern   Not on file  Social History Narrative   Lives at home alone.   Right-handed.   No caffeine use.   Social Determinants of Health   Financial Resource Strain: Not on file  Food Insecurity: Not on file  Transportation Needs: Not on file  Physical Activity: Not on file  Stress: Not on file  Social Connections: Not on file  Intimate Partner Violence: Not on file    Outpatient Medications Prior to Visit  Medication  Sig Dispense Refill   albuterol (VENTOLIN HFA) 108 (90 Base) MCG/ACT inhaler Inhale 2 puffs into the lungs every 6 (six) hours as needed for wheezing or shortness of breath. 8 g 1   amLODipine (NORVASC) 10 MG tablet TAKE 1 TABLET DAILY 90 tablet 3   budesonide-formoterol (SYMBICORT) 160-4.5 MCG/ACT inhaler Inhale 2 puffs into the lungs 2 (two) times daily. 1 Inhaler 3   citalopram (CELEXA) 40 MG tablet Take 1 tablet (40 mg total) by mouth daily. 90 tablet 1   divalproex (DEPAKOTE ER) 500 MG 24 hr tablet Take 500-1,000 mg by mouth See admin instructions. 500MG  in the morning and  1000mg  at bedtime     FEROSUL 325 (65 Fe) MG tablet TAKE (1) TABLET DAILY WITH BREAKFAST. 90 tablet 0   fluticasone (FLONASE) 50 MCG/ACT nasal spray Place 2 sprays into both nostrils daily. 16 g 6   GNP ULTICARE PEN NEEDLES 32G X 4 MM MISC      HYDROcodone-acetaminophen (NORCO) 10-325 MG tablet Take 1 tablet by mouth every 6 (six) hours as needed.     insulin lispro (HUMALOG) 100 UNIT/ML cartridge 0-12 units 3 times daily before meals as per sliding scale 15 mL 3   Insulin Syringe-Needle U-100 (ADVOCATE INSULIN SYRINGE) 29G X 1/2" 0.3 ML MISC Use to inject insulin 5x daily. 200 each 6   meclizine (ANTIVERT) 12.5 MG tablet TAKE 1 TABLET 3 TIMES A DAY AS NEEDED FOR DIZZINESS 30 tablet 0   metFORMIN (GLUCOPHAGE) 1000 MG tablet Take 1 tablet (1,000 mg total) by mouth 2 (two) times daily with a meal. 180 tablet 3   oxybutynin (DITROPAN) 5 MG tablet Take 1 tablet (5 mg total) by mouth 3 (three) times daily. TAKE  (1)  TABLET TWICE A DAY. 270 tablet 1   PEG-KCl-NaCl-NaSulf-Na Asc-C (PLENVU) 140 g SOLR Take 140 g by mouth as directed. Manufacturer's coupon Universal coupon code:BIN: P2366821; GROUP: [id number]; PCN: [id number]; ID: [id number]; PAY NO MORE $50 1 each 0   sitaGLIPtin (JANUVIA) 100 MG tablet Take 1 tablet (100 mg total) by mouth daily. 90 tablet 1   Vitamin D, Ergocalciferol, (DRISDOL) 1.25 MG (50000 UNIT) CAPS capsule Take 1 capsule (50,000 Units total) by mouth every 7 (seven) days. 32 capsule 0   XARELTO 20 MG TABS tablet TAKE 1 TABLET WITH SUPPER 30 tablet 1   calcium-vitamin D (OSCAL WITH D) 500-200 MG-UNIT tablet Take 1 tablet by mouth daily with breakfast. 90 tablet 1   dextromethorphan-guaiFENesin (MUCINEX DM) 30-600 MG 12hr tablet Take 1 tablet by mouth 2 (two) times daily as needed for cough. 60 tablet 3   gabapentin (NEURONTIN) 300 MG capsule Take 1 capsule (300 mg total) by mouth 3 (three) times daily. (Patient taking differently: Take 300 mg by mouth 3 (three) times daily. States she  takes 3 tabs TID) 270 capsule 11   insulin glargine (LANTUS SOLOSTAR) 100 UNIT/ML Solostar Pen Inject 16 Units into the skin at bedtime. 15 mL 3   meloxicam (MOBIC) 7.5 MG tablet TAKE 1 TABLET DAILY 30 tablet 0   sulfamethoxazole-trimethoprim (BACTRIM DS) 800-160 MG tablet Take 1 tablet by mouth 2 (two) times daily. 20 tablet 0   metoprolol tartrate (LOPRESSOR) 25 MG tablet Take 1 tablet (25 mg total) by mouth 2 (two) times daily. 180 tablet 3   rosuvastatin (CRESTOR) 10 MG tablet Take 1 tablet (10 mg total) by mouth daily. 90 tablet 3   No facility-administered medications prior to visit.    Allergies  Allergen Reactions  Aspirin Nausea Only and Other (See Comments)    Causes stomach pain    ROS Review of Systems    Objective:    Physical Exam Vitals reviewed.     BP 112/73 (BP Location: Right Arm, Patient Position: Sitting, Cuff Size: Large)   Pulse 60   Temp (!) 97.5 F (36.4 C) (Temporal)   Ht 6\' 3"  (1.905 m)   Wt 273 lb 6.4 oz (124 kg)   SpO2 100%   BMI 34.17 kg/m  Wt Readings from Last 3 Encounters:  12/10/20 273 lb (123.8 kg)  12/08/20 273 lb 6.4 oz (124 kg)  09/10/20 286 lb (129.7 kg)     Health Maintenance Due  Topic Date Due   PNEUMOCOCCAL POLYSACCHARIDE VACCINE AGE 51-64 HIGH RISK  Never done   Pneumococcal Vaccine 67-52 Years old (1 of 2 - PPSV23) Never done   OPHTHALMOLOGY EXAM  Never done   Hepatitis C Screening  Never done   TETANUS/TDAP  Never done   PAP SMEAR-Modifier  Never done   COLONOSCOPY (Pts 45-23yrs Insurance coverage will need to be confirmed)  Never done   MAMMOGRAM  Never done   Zoster Vaccines- Shingrix (1 of 2) Never done   COVID-19 Vaccine (2 - Booster for Janssen series) 12/05/2019    There are no preventive care reminders to display for this patient.  Lab Results  Component Value Date   TSH 0.600 03/17/2020   Lab Results  Component Value Date   WBC 8.8 08/05/2020   HGB 12.8 08/05/2020   HCT 38.9 08/05/2020   MCV 88  08/05/2020   PLT 201 08/05/2020   Lab Results  Component Value Date   NA 139 03/17/2020   K 4.2 03/17/2020   CO2 23 03/17/2020   GLUCOSE 109 (H) 03/17/2020   BUN 15 03/17/2020   CREATININE 0.91 03/17/2020   BILITOT 0.6 03/17/2020   ALKPHOS 38 03/17/2020   AST 15 03/17/2020   ALT 15 03/17/2020   PROT 7.5 06/02/2020   ALBUMIN 3.6 03/17/2020   CALCIUM 8.8 (L) 03/17/2020   ANIONGAP 13 03/17/2020   Lab Results  Component Value Date   CHOL 116 06/08/2020   Lab Results  Component Value Date   HDL 26 (L) 06/08/2020   Lab Results  Component Value Date   LDLCALC 67 06/08/2020   Lab Results  Component Value Date   TRIG 125 06/08/2020   Lab Results  Component Value Date   CHOLHDL 4.5 (H) 06/08/2020   Lab Results  Component Value Date   HGBA1C 9.4 (A) 12/08/2020      Assessment & Plan:   Giana was seen today for diabetes.  Diagnoses and all orders for this visit:  Type 2 diabetes mellitus with 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, with long-term current use of insulin (HCC) -     Glucose (CBG) -     HgB A1c -     insulin glargine (LANTUS SOLOSTAR) 100 UNIT/ML Solostar Pen; Inject 20 Units into the skin at bedtime.  Dysuria -     POCT URINALYSIS DIP (CLINITEK) -     Urine Culture -     phenazopyridine (PYRIDIUM) 100 MG tablet; Take 1 tablet (100 mg total) by mouth 3 (three) times daily as needed for pain.  Mood swing -     Ambulatory referral to Psychiatry  Medication refill -     insulin glargine (LANTUS SOLOSTAR) 100 UNIT/ML Solostar Pen; Inject 20 Units into the skin at bedtime.  Colon cancer screening -  Ambulatory referral to Gastroenterology  Encounter for screening mammogram for malignant neoplasm of breast -     MM Digital Screening; Future   Follow-up: Return in about 3 months (around 03/10/2021) for pap/DM.    [name], NP

## 2020-12-14 ENCOUNTER — Other Ambulatory Visit: Payer: Self-pay | Admitting: Cardiology

## 2020-12-14 ENCOUNTER — Other Ambulatory Visit (INDEPENDENT_AMBULATORY_CARE_PROVIDER_SITE_OTHER): Payer: Self-pay | Admitting: Primary Care

## 2020-12-14 DIAGNOSIS — F329 Major depressive disorder, single episode, unspecified: Secondary | ICD-10-CM | POA: Diagnosis not present

## 2020-12-14 DIAGNOSIS — G459 Transient cerebral ischemic attack, unspecified: Secondary | ICD-10-CM | POA: Diagnosis not present

## 2020-12-14 NOTE — Telephone Encounter (Signed)
  Notes to clinic: this script has expired Review for continued use and refill    Requested Prescriptions  Pending Prescriptions Disp Refills   albuterol (VENTOLIN HFA) 108 (90 Base) MCG/ACT inhaler [Pharmacy Med Name: albuterol sulfate HFA 90 mcg/actuation aerosol inhaler] 18 g 1    Sig: INHALE 2 PUFFS EVERY 6 HOURS AS NEEDED FOR WHEEZING OR SHORTNESS OF BREATH      Pulmonology:  Beta Agonists Failed - 12/14/2020 11:44 AM      Failed - One inhaler should last at least one month. If the patient is requesting refills earlier, contact the patient to check for uncontrolled symptoms.      Passed - Valid encounter within last 12 months    Recent Outpatient Visits           6 days ago Type 2 diabetes mellitus with 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, with long-term current use of insulin ([place])   [place] [name] P, NP   3 months ago Type 2 diabetes mellitus with 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, with long-term current use of insulin ([place])   [place], Michelle P, NP   6 months ago Type 2 diabetes mellitus with 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, with long-term current use of insulin ([place])   [place], Michelle P, NP   7 months ago Dysuria   [place], Michelle P, NP   8 months ago Fall, subsequent encounter   [place], [place], NP       Future Appointments             In 1 month [name], MD [facility], [facility]   In 2 months Oletta Lamas, [place] Cage, NP [place]

## 2020-12-14 NOTE — Telephone Encounter (Signed)
This is Dr. Schumann's pt 

## 2020-12-15 DIAGNOSIS — G459 Transient cerebral ischemic attack, unspecified: Secondary | ICD-10-CM | POA: Diagnosis not present

## 2020-12-16 DIAGNOSIS — G459 Transient cerebral ischemic attack, unspecified: Secondary | ICD-10-CM | POA: Diagnosis not present

## 2020-12-17 ENCOUNTER — Other Ambulatory Visit (INDEPENDENT_AMBULATORY_CARE_PROVIDER_SITE_OTHER): Payer: Self-pay | Admitting: Primary Care

## 2020-12-17 DIAGNOSIS — G459 Transient cerebral ischemic attack, unspecified: Secondary | ICD-10-CM | POA: Diagnosis not present

## 2020-12-18 DIAGNOSIS — G459 Transient cerebral ischemic attack, unspecified: Secondary | ICD-10-CM | POA: Diagnosis not present

## 2020-12-19 DIAGNOSIS — G459 Transient cerebral ischemic attack, unspecified: Secondary | ICD-10-CM | POA: Diagnosis not present

## 2020-12-20 DIAGNOSIS — G459 Transient cerebral ischemic attack, unspecified: Secondary | ICD-10-CM | POA: Diagnosis not present

## 2020-12-21 DIAGNOSIS — G459 Transient cerebral ischemic attack, unspecified: Secondary | ICD-10-CM | POA: Diagnosis not present

## 2020-12-22 DIAGNOSIS — G459 Transient cerebral ischemic attack, unspecified: Secondary | ICD-10-CM | POA: Diagnosis not present

## 2020-12-23 DIAGNOSIS — G459 Transient cerebral ischemic attack, unspecified: Secondary | ICD-10-CM | POA: Diagnosis not present

## 2020-12-24 DIAGNOSIS — G459 Transient cerebral ischemic attack, unspecified: Secondary | ICD-10-CM | POA: Diagnosis not present

## 2020-12-25 DIAGNOSIS — G459 Transient cerebral ischemic attack, unspecified: Secondary | ICD-10-CM | POA: Diagnosis not present

## 2020-12-26 DIAGNOSIS — G459 Transient cerebral ischemic attack, unspecified: Secondary | ICD-10-CM | POA: Diagnosis not present

## 2020-12-27 DIAGNOSIS — G459 Transient cerebral ischemic attack, unspecified: Secondary | ICD-10-CM | POA: Diagnosis not present

## 2020-12-28 DIAGNOSIS — G459 Transient cerebral ischemic attack, unspecified: Secondary | ICD-10-CM | POA: Diagnosis not present

## 2020-12-29 DIAGNOSIS — G459 Transient cerebral ischemic attack, unspecified: Secondary | ICD-10-CM | POA: Diagnosis not present

## 2020-12-30 DIAGNOSIS — G459 Transient cerebral ischemic attack, unspecified: Secondary | ICD-10-CM | POA: Diagnosis not present

## 2020-12-31 ENCOUNTER — Other Ambulatory Visit (INDEPENDENT_AMBULATORY_CARE_PROVIDER_SITE_OTHER): Payer: Self-pay | Admitting: Primary Care

## 2020-12-31 ENCOUNTER — Other Ambulatory Visit: Payer: Self-pay | Admitting: Orthopedic Surgery

## 2020-12-31 DIAGNOSIS — G459 Transient cerebral ischemic attack, unspecified: Secondary | ICD-10-CM | POA: Diagnosis not present

## 2021-01-01 DIAGNOSIS — G459 Transient cerebral ischemic attack, unspecified: Secondary | ICD-10-CM | POA: Diagnosis not present

## 2021-01-02 DIAGNOSIS — G459 Transient cerebral ischemic attack, unspecified: Secondary | ICD-10-CM | POA: Diagnosis not present

## 2021-01-03 DIAGNOSIS — G459 Transient cerebral ischemic attack, unspecified: Secondary | ICD-10-CM | POA: Diagnosis not present

## 2021-01-04 DIAGNOSIS — G459 Transient cerebral ischemic attack, unspecified: Secondary | ICD-10-CM | POA: Diagnosis not present

## 2021-01-05 DIAGNOSIS — G459 Transient cerebral ischemic attack, unspecified: Secondary | ICD-10-CM | POA: Diagnosis not present

## 2021-01-06 DIAGNOSIS — G459 Transient cerebral ischemic attack, unspecified: Secondary | ICD-10-CM | POA: Diagnosis not present

## 2021-01-07 DIAGNOSIS — G459 Transient cerebral ischemic attack, unspecified: Secondary | ICD-10-CM | POA: Diagnosis not present

## 2021-01-08 DIAGNOSIS — Z79899 Other long term (current) drug therapy: Secondary | ICD-10-CM | POA: Diagnosis not present

## 2021-01-08 DIAGNOSIS — G459 Transient cerebral ischemic attack, unspecified: Secondary | ICD-10-CM | POA: Diagnosis not present

## 2021-01-08 DIAGNOSIS — G8929 Other chronic pain: Secondary | ICD-10-CM | POA: Diagnosis not present

## 2021-01-08 DIAGNOSIS — M5416 Radiculopathy, lumbar region: Secondary | ICD-10-CM | POA: Diagnosis not present

## 2021-01-09 DIAGNOSIS — G459 Transient cerebral ischemic attack, unspecified: Secondary | ICD-10-CM | POA: Diagnosis not present

## 2021-01-10 DIAGNOSIS — G459 Transient cerebral ischemic attack, unspecified: Secondary | ICD-10-CM | POA: Diagnosis not present

## 2021-01-12 DIAGNOSIS — G459 Transient cerebral ischemic attack, unspecified: Secondary | ICD-10-CM | POA: Diagnosis not present

## 2021-01-13 DIAGNOSIS — G459 Transient cerebral ischemic attack, unspecified: Secondary | ICD-10-CM | POA: Diagnosis not present

## 2021-01-14 ENCOUNTER — Other Ambulatory Visit: Payer: Self-pay | Admitting: Cardiology

## 2021-01-14 DIAGNOSIS — G459 Transient cerebral ischemic attack, unspecified: Secondary | ICD-10-CM | POA: Diagnosis not present

## 2021-01-14 NOTE — Telephone Encounter (Signed)
This is Dr. Schumann's pt 

## 2021-01-15 DIAGNOSIS — G459 Transient cerebral ischemic attack, unspecified: Secondary | ICD-10-CM | POA: Diagnosis not present

## 2021-01-16 ENCOUNTER — Telehealth: Payer: Self-pay | Admitting: Neurology

## 2021-01-16 DIAGNOSIS — G459 Transient cerebral ischemic attack, unspecified: Secondary | ICD-10-CM | POA: Diagnosis not present

## 2021-01-16 MED ORDER — GABAPENTIN 300 MG PO CAPS: 900.0000 mg | ORAL_CAPSULE | Freq: Three times a day (TID) | ORAL | 3 refills | Status: DC

## 2021-01-16 NOTE — Telephone Encounter (Signed)
The patient was sent prescription for gabapentin 300 mg 3 times daily, she is actually taking 900 mg 3 times daily, the prescription was resent to [facility].

## 2021-01-17 DIAGNOSIS — G459 Transient cerebral ischemic attack, unspecified: Secondary | ICD-10-CM | POA: Diagnosis not present

## 2021-01-18 DIAGNOSIS — G459 Transient cerebral ischemic attack, unspecified: Secondary | ICD-10-CM | POA: Diagnosis not present

## 2021-01-19 DIAGNOSIS — G459 Transient cerebral ischemic attack, unspecified: Secondary | ICD-10-CM | POA: Diagnosis not present

## 2021-01-20 DIAGNOSIS — G459 Transient cerebral ischemic attack, unspecified: Secondary | ICD-10-CM | POA: Diagnosis not present

## 2021-01-21 DIAGNOSIS — G459 Transient cerebral ischemic attack, unspecified: Secondary | ICD-10-CM | POA: Diagnosis not present

## 2021-01-22 DIAGNOSIS — M797 Fibromyalgia: Secondary | ICD-10-CM | POA: Diagnosis not present

## 2021-01-23 DIAGNOSIS — M797 Fibromyalgia: Secondary | ICD-10-CM | POA: Diagnosis not present

## 2021-01-24 DIAGNOSIS — M797 Fibromyalgia: Secondary | ICD-10-CM | POA: Diagnosis not present

## 2021-01-25 DIAGNOSIS — M797 Fibromyalgia: Secondary | ICD-10-CM | POA: Diagnosis not present

## 2021-01-26 ENCOUNTER — Other Ambulatory Visit (INDEPENDENT_AMBULATORY_CARE_PROVIDER_SITE_OTHER): Payer: Self-pay | Admitting: Primary Care

## 2021-01-26 DIAGNOSIS — M797 Fibromyalgia: Secondary | ICD-10-CM | POA: Diagnosis not present

## 2021-01-26 NOTE — Telephone Encounter (Signed)
  Notes to clinic:  medication filled by different provider  Review for refill    Requested Prescriptions  Pending Prescriptions Disp Refills   metoprolol tartrate (LOPRESSOR) 25 MG tablet [Pharmacy Med Name: metoprolol tartrate 25 mg tablet] 180 tablet 3    Sig: Take 1 tablet (25 mg total) by mouth 2 (two) times daily.      Cardiovascular:  Beta Blockers Passed - 01/26/2021 11:00 AM      Passed - Last BP in normal range    BP Readings from Last 1 Encounters:  12/10/20 125/74          Passed - Last Heart Rate in normal range    Pulse Readings from Last 1 Encounters:  12/10/20 (!) 56          Passed - Valid encounter within last 6 months    Recent Outpatient Visits           1 month ago Type 2 diabetes mellitus with 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, with long-term current use of insulin ([place])   [place] [facility] [name] P, NP   4 months ago Type 2 diabetes mellitus with 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, with long-term current use of insulin ([place])   [place], Michelle P, NP   7 months ago Type 2 diabetes mellitus with 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, with long-term current use of insulin ([place])   [place], Michelle P, NP   8 months ago Dysuria   [place], Michelle P, NP   9 months ago Fall, subsequent encounter   [place], [place], NP       Future Appointments             In 3 weeks [name], MD [facility], [facility]   In 1 month Oletta Lamas, [place] Cage, NP [place]

## 2021-01-27 DIAGNOSIS — M797 Fibromyalgia: Secondary | ICD-10-CM | POA: Diagnosis not present

## 2021-01-28 DIAGNOSIS — M797 Fibromyalgia: Secondary | ICD-10-CM | POA: Diagnosis not present

## 2021-01-29 DIAGNOSIS — M797 Fibromyalgia: Secondary | ICD-10-CM | POA: Diagnosis not present

## 2021-01-30 DIAGNOSIS — M797 Fibromyalgia: Secondary | ICD-10-CM | POA: Diagnosis not present

## 2021-01-31 DIAGNOSIS — M797 Fibromyalgia: Secondary | ICD-10-CM | POA: Diagnosis not present

## 2021-02-01 ENCOUNTER — Ambulatory Visit: Payer: Medicaid Other | Admitting: Cardiology

## 2021-02-01 DIAGNOSIS — M797 Fibromyalgia: Secondary | ICD-10-CM | POA: Diagnosis not present

## 2021-02-02 DIAGNOSIS — M797 Fibromyalgia: Secondary | ICD-10-CM | POA: Diagnosis not present

## 2021-02-03 DIAGNOSIS — M797 Fibromyalgia: Secondary | ICD-10-CM | POA: Diagnosis not present

## 2021-02-04 ENCOUNTER — Other Ambulatory Visit: Payer: Self-pay | Admitting: Orthopedic Surgery

## 2021-02-04 DIAGNOSIS — M797 Fibromyalgia: Secondary | ICD-10-CM | POA: Diagnosis not present

## 2021-02-04 NOTE — Telephone Encounter (Signed)
**   she is on Xarelto

## 2021-02-05 DIAGNOSIS — M25562 Pain in left knee: Secondary | ICD-10-CM | POA: Diagnosis not present

## 2021-02-05 DIAGNOSIS — M25561 Pain in right knee: Secondary | ICD-10-CM | POA: Diagnosis not present

## 2021-02-05 DIAGNOSIS — M797 Fibromyalgia: Secondary | ICD-10-CM | POA: Diagnosis not present

## 2021-02-05 DIAGNOSIS — Z79899 Other long term (current) drug therapy: Secondary | ICD-10-CM | POA: Diagnosis not present

## 2021-02-05 DIAGNOSIS — G8929 Other chronic pain: Secondary | ICD-10-CM | POA: Diagnosis not present

## 2021-02-05 DIAGNOSIS — M5416 Radiculopathy, lumbar region: Secondary | ICD-10-CM | POA: Diagnosis not present

## 2021-02-06 DIAGNOSIS — S93402A Sprain of unspecified ligament of left ankle, initial encounter: Secondary | ICD-10-CM | POA: Diagnosis not present

## 2021-02-06 DIAGNOSIS — M25562 Pain in left knee: Secondary | ICD-10-CM | POA: Diagnosis not present

## 2021-02-06 DIAGNOSIS — M797 Fibromyalgia: Secondary | ICD-10-CM | POA: Diagnosis not present

## 2021-02-06 DIAGNOSIS — M1712 Unilateral primary osteoarthritis, left knee: Secondary | ICD-10-CM | POA: Diagnosis not present

## 2021-02-06 DIAGNOSIS — M79672 Pain in left foot: Secondary | ICD-10-CM | POA: Diagnosis not present

## 2021-02-07 DIAGNOSIS — M797 Fibromyalgia: Secondary | ICD-10-CM | POA: Diagnosis not present

## 2021-02-08 DIAGNOSIS — M797 Fibromyalgia: Secondary | ICD-10-CM | POA: Diagnosis not present

## 2021-02-09 DIAGNOSIS — M797 Fibromyalgia: Secondary | ICD-10-CM | POA: Diagnosis not present

## 2021-02-10 DIAGNOSIS — M797 Fibromyalgia: Secondary | ICD-10-CM | POA: Diagnosis not present

## 2021-02-11 DIAGNOSIS — M797 Fibromyalgia: Secondary | ICD-10-CM | POA: Diagnosis not present

## 2021-02-12 DIAGNOSIS — M797 Fibromyalgia: Secondary | ICD-10-CM | POA: Diagnosis not present

## 2021-02-13 DIAGNOSIS — M797 Fibromyalgia: Secondary | ICD-10-CM | POA: Diagnosis not present

## 2021-02-14 DIAGNOSIS — M797 Fibromyalgia: Secondary | ICD-10-CM | POA: Diagnosis not present

## 2021-02-15 DIAGNOSIS — M797 Fibromyalgia: Secondary | ICD-10-CM | POA: Diagnosis not present

## 2021-02-16 ENCOUNTER — Ambulatory Visit (HOSPITAL_BASED_OUTPATIENT_CLINIC_OR_DEPARTMENT_OTHER): Payer: Medicaid Other | Admitting: Cardiology

## 2021-02-16 DIAGNOSIS — M797 Fibromyalgia: Secondary | ICD-10-CM | POA: Diagnosis not present

## 2021-02-17 DIAGNOSIS — M797 Fibromyalgia: Secondary | ICD-10-CM | POA: Diagnosis not present

## 2021-02-18 DIAGNOSIS — M797 Fibromyalgia: Secondary | ICD-10-CM | POA: Diagnosis not present

## 2021-02-19 DIAGNOSIS — M797 Fibromyalgia: Secondary | ICD-10-CM | POA: Diagnosis not present

## 2021-02-20 DIAGNOSIS — M797 Fibromyalgia: Secondary | ICD-10-CM | POA: Diagnosis not present

## 2021-02-21 DIAGNOSIS — M797 Fibromyalgia: Secondary | ICD-10-CM | POA: Diagnosis not present

## 2021-02-22 DIAGNOSIS — M797 Fibromyalgia: Secondary | ICD-10-CM | POA: Diagnosis not present

## 2021-02-23 ENCOUNTER — Other Ambulatory Visit (INDEPENDENT_AMBULATORY_CARE_PROVIDER_SITE_OTHER): Payer: Self-pay | Admitting: Primary Care

## 2021-02-23 DIAGNOSIS — M797 Fibromyalgia: Secondary | ICD-10-CM | POA: Diagnosis not present

## 2021-02-23 NOTE — Telephone Encounter (Signed)
Please send refill if appropriate

## 2021-02-23 NOTE — Telephone Encounter (Signed)
Requested medication (s) are due for refill today: yes  Requested medication (s) are on the active medication list: yes  Last refill:  12/17/20 #30 1 Rf  Future visit scheduled: yes  Notes to clinic:  overdue lab work   Requested Prescriptions  Pending Prescriptions Disp Refills   XARELTO 20 MG TABS tablet [Pharmacy Med Name: Xarelto 20 mg tablet] 30 tablet 1    Sig: TAKE ONE TABLET BY MOUTH WITH SUPPER     Hematology: Anticoagulants - rivaroxaban Failed - 02/23/2021 11:14 AM      Failed - ALT in normal range and within 180 days    ALT  Date Value Ref Range Status  03/17/2020 15 0 - 44 U/L Final          Failed - AST in normal range and within 180 days    AST  Date Value Ref Range Status  03/17/2020 15 15 - 41 U/L Final          Passed - Cr in normal range and within 360 days    Creatinine, Ser  Date Value Ref Range Status  03/17/2020 0.91 0.44 - 1.00 mg/dL Final   Creatinine, Urine  Date Value Ref Range Status  12/17/2019 128.15 mg/dL Final    Comment:    Performed at [facility], [address] [place], [place] [address]          Passed - HCT in normal range and within 360 days    Hematocrit  Date Value Ref Range Status  08/05/2020 38.9 34.0 - 46.6 % Final          Passed - HGB in normal range and within 360 days    Hemoglobin  Date Value Ref Range Status  08/05/2020 12.8 11.1 - 15.9 g/dL Final          Passed - PLT in normal range and within 360 days    Platelets  Date Value Ref Range Status  08/05/2020 201 150 - 450 x10E3/uL Final          Passed - Valid encounter within last 12 months    Recent Outpatient Visits           2 months ago Type 2 diabetes mellitus with 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, with long-term current use of insulin ([place])   [place] [name] P, NP   5 months ago Type 2 diabetes mellitus with 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, with long-term current use of insulin ([place])   [place], Michelle P, NP   8 months ago Type 2 diabetes mellitus with 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, with long-term current use of insulin ([place])   [place], Michelle P, NP   9 months ago Dysuria   [place] [name], NP   10 months ago Fall, subsequent encounter   [place], [place], NP       Future Appointments             In 2 days Fenton Foy, NP [place]   In 1 week Loel Dubonnet, NP [facility], [facility]   In 2 weeks Oletta Lamas [place] Cage, NP [place]

## 2021-02-24 ENCOUNTER — Other Ambulatory Visit (INDEPENDENT_AMBULATORY_CARE_PROVIDER_SITE_OTHER): Payer: Self-pay | Admitting: Primary Care

## 2021-02-24 DIAGNOSIS — M797 Fibromyalgia: Secondary | ICD-10-CM | POA: Diagnosis not present

## 2021-02-24 DIAGNOSIS — E1100 Type 2 diabetes mellitus with hyperosmolarity without nonketotic hyperglycemic-hyperosmolar coma (NKHHC): Secondary | ICD-10-CM

## 2021-02-24 DIAGNOSIS — Z760 Encounter for issue of repeat prescription: Secondary | ICD-10-CM

## 2021-02-24 DIAGNOSIS — Z794 Long term (current) use of insulin: Secondary | ICD-10-CM

## 2021-02-25 ENCOUNTER — Ambulatory Visit (INDEPENDENT_AMBULATORY_CARE_PROVIDER_SITE_OTHER): Payer: Medicaid Other | Admitting: Nurse Practitioner

## 2021-02-25 DIAGNOSIS — M797 Fibromyalgia: Secondary | ICD-10-CM | POA: Diagnosis not present

## 2021-02-26 DIAGNOSIS — M797 Fibromyalgia: Secondary | ICD-10-CM | POA: Diagnosis not present

## 2021-02-27 DIAGNOSIS — M797 Fibromyalgia: Secondary | ICD-10-CM | POA: Diagnosis not present

## 2021-02-28 DIAGNOSIS — M797 Fibromyalgia: Secondary | ICD-10-CM | POA: Diagnosis not present

## 2021-03-01 DIAGNOSIS — M797 Fibromyalgia: Secondary | ICD-10-CM | POA: Diagnosis not present

## 2021-03-02 ENCOUNTER — Other Ambulatory Visit: Payer: Medicaid Other | Admitting: Adult Health

## 2021-03-02 DIAGNOSIS — M797 Fibromyalgia: Secondary | ICD-10-CM | POA: Diagnosis not present

## 2021-03-03 DIAGNOSIS — M797 Fibromyalgia: Secondary | ICD-10-CM | POA: Diagnosis not present

## 2021-03-04 DIAGNOSIS — M797 Fibromyalgia: Secondary | ICD-10-CM | POA: Diagnosis not present

## 2021-03-05 DIAGNOSIS — M25561 Pain in right knee: Secondary | ICD-10-CM | POA: Diagnosis not present

## 2021-03-05 DIAGNOSIS — M797 Fibromyalgia: Secondary | ICD-10-CM | POA: Diagnosis not present

## 2021-03-05 DIAGNOSIS — G8929 Other chronic pain: Secondary | ICD-10-CM | POA: Diagnosis not present

## 2021-03-05 DIAGNOSIS — Z79899 Other long term (current) drug therapy: Secondary | ICD-10-CM | POA: Diagnosis not present

## 2021-03-05 DIAGNOSIS — M5416 Radiculopathy, lumbar region: Secondary | ICD-10-CM | POA: Diagnosis not present

## 2021-03-05 DIAGNOSIS — M25562 Pain in left knee: Secondary | ICD-10-CM | POA: Diagnosis not present

## 2021-03-05 DIAGNOSIS — Z7689 Persons encountering health services in other specified circumstances: Secondary | ICD-10-CM | POA: Diagnosis not present

## 2021-03-06 DIAGNOSIS — M797 Fibromyalgia: Secondary | ICD-10-CM | POA: Diagnosis not present

## 2021-03-07 DIAGNOSIS — M797 Fibromyalgia: Secondary | ICD-10-CM | POA: Diagnosis not present

## 2021-03-08 ENCOUNTER — Encounter (HOSPITAL_BASED_OUTPATIENT_CLINIC_OR_DEPARTMENT_OTHER): Payer: Self-pay | Admitting: Family

## 2021-03-08 ENCOUNTER — Ambulatory Visit (INDEPENDENT_AMBULATORY_CARE_PROVIDER_SITE_OTHER): Payer: Medicaid Other | Admitting: Family

## 2021-03-08 ENCOUNTER — Other Ambulatory Visit: Payer: Self-pay

## 2021-03-08 VITALS — BP 128/74 | HR 60 | Ht 72.0 in | Wt 282.2 lb

## 2021-03-08 DIAGNOSIS — I480 Paroxysmal atrial fibrillation: Secondary | ICD-10-CM | POA: Diagnosis not present

## 2021-03-08 DIAGNOSIS — I10 Essential (primary) hypertension: Secondary | ICD-10-CM | POA: Diagnosis not present

## 2021-03-08 DIAGNOSIS — Z7901 Long term (current) use of anticoagulants: Secondary | ICD-10-CM

## 2021-03-08 DIAGNOSIS — Z720 Tobacco use: Secondary | ICD-10-CM | POA: Diagnosis not present

## 2021-03-08 DIAGNOSIS — Z7689 Persons encountering health services in other specified circumstances: Secondary | ICD-10-CM | POA: Diagnosis not present

## 2021-03-08 DIAGNOSIS — M797 Fibromyalgia: Secondary | ICD-10-CM | POA: Diagnosis not present

## 2021-03-08 DIAGNOSIS — R42 Dizziness and giddiness: Secondary | ICD-10-CM

## 2021-03-08 MED ORDER — BUPROPION HCL ER (SR) 150 MG PO TB12: ORAL_TABLET | ORAL | 2 refills | Status: DC

## 2021-03-08 NOTE — Patient Instructions (Addendum)
Medication Instructions:  Your physician has recommended you make the following change in your medication:   START Wellbutrin to help quit smoking *Take Wellbutrin '150mg'$  daily for 3 days *After three days, increase to Wellbutrin '150mg'$  twice per day *Be sure to set a quit date within the first 4 weeks of taking.   If you wish to talk with Elias Else, PsD about skills to help quit smoking and manage stress please contact our office and we will gladly place a referral.   *If you need a refill on your cardiac medications before your next appointment, please call your pharmacy*   Lab Work: None ordered today.   Testing/Procedures: Your EKG today showed normal sinus rhythm.   Follow-Up: At [facility], you and your health needs are our priority.  As part of our continuing mission to provide you with exceptional heart care, we have created designated Provider Care Teams.  These Care Teams include your primary Cardiologist (physician) and Advanced Practice Providers (APPs -  Physician Assistants and Nurse Practitioners) who all work together to provide you with the care you need, when you need it.  We recommend signing up for the patient portal called "MyChart".  Sign up information is provided on this After Visit Summary.  MyChart is used to connect with patients for Virtual Visits (Telemedicine).  Patients are able to view lab/test results, encounter notes, upcoming appointments, etc.  Non-urgent messages can be sent to your provider as well.   To learn more about what you can do with MyChart, go to [email or web address].    Your next appointment:   6 month(s)  The format for your next appointment:   In Person  Provider:   You may see [name], MD or one of the following Advanced Practice Providers on your designated Care Team:   Rosaria Ferries, PA-C Caron Presume, PA-C Jory Sims, DNP, ANP  Other Instructions  To prevent or reduce lower extremity  swelling: Eat a low salt diet. Salt makes the body hold onto extra fluid which causes swelling. Sit with legs elevated. For example, in the recliner or on an [place].  Wear knee-high compression stockings during the daytime. Ones labeled 15-20 mmHg provide good compression.   Managing the Challenge of Quitting Smoking Quitting smoking is a physical and mental challenge. You will face cravings, withdrawal symptoms, and temptation. Before quitting, work with your health care provider to make a plan that can help you manage quitting. Preparation canhelp you quit and keep you from giving in. How to manage lifestyle changes Managing stress Stress can make you want to smoke, and wanting to smoke may cause stress. It is important to find ways to manage your stress. You might try some of the following: Practice relaxation techniques. Breathe slowly and deeply, in through your nose and out through your mouth. Listen to music. Soak in a bath or take a shower. Imagine a peaceful place or vacation. Get some support. Talk with family or friends about your stress. Join a support group. Talk with a counselor or therapist. Get some physical activity. Go for a walk, run, or bike ride. Play a favorite sport. Practice yoga.  Medicines Talk with your health care provider about medicines that might help you dealwith cravings and make quitting easier for you. Relationships Social situations can be difficult when you are quitting smoking. To manage this, you can: Avoid parties and other social situations where people might be smoking. Avoid alcohol. Leave right away if you have the urge  to smoke. Explain to your family and friends that you are quitting smoking. Ask for support and let them know you might be a bit grumpy. Plan activities where smoking is not an option. General instructions Be aware that many people gain weight after they quit smoking. However, not everyone does. To keep from gaining  weight, have a plan in place before you quit and stick to the plan after you quit. Your plan should include: Having healthy snacks. When you have a craving, it may help to: Eat popcorn, carrots, celery, or other cut vegetables. Chew sugar-free gum. Changing how you eat. Eat small portion sizes at meals. Eat 4-6 small meals throughout the day instead of 1-2 large meals a day. Be mindful when you eat. Do not watch television or do other things that might distract you as you eat. Exercising regularly. Make time to exercise each day. If you do not have time for a long workout, do short bouts of exercise for 5-10 minutes several times a day. Do some form of strengthening exercise, such as weight lifting. Do some exercise that gets your heart beating and causes you to breathe deeply, such as walking fast, running, swimming, or biking. This is very important. Drinking plenty of water or other low-calorie or no-calorie drinks. Drink 6-8 glasses of water daily.  How to recognize withdrawal symptoms Your body and mind may experience discomfort as you try to get used to not having nicotine in your system. These effects are called withdrawal symptoms. They may include: Feeling hungrier than normal. Having trouble concentrating. Feeling irritable or restless. Having trouble sleeping. Feeling depressed. Craving a cigarette. To manage withdrawal symptoms: Avoid places, people, and activities that trigger your cravings. Remember why you want to quit. Get plenty of sleep. Avoid coffee and other caffeinated drinks. These may worsen some of your symptoms. These symptoms may surprise you. But be assured that they are normal to havewhen quitting smoking. How to manage cravings Come up with a plan for how to deal with your cravings. The plan should include the following: A definition of the specific situation you want to deal with. An alternative action you will take. A clear idea for how this action will  help. The name of someone who might help you with this. Cravings usually last for 5-10 minutes. Consider taking the following actions to help you with your plan to deal with cravings: Keep your mouth busy. Chew sugar-free gum. Suck on hard candies or a straw. Brush your teeth. Keep your hands and body busy. Change to a different activity right away. Squeeze or play with a ball. Do an activity or a hobby, such as making bead jewelry, practicing needlepoint, or working with wood. Mix up your normal routine. Take a short exercise break. Go for a quick walk or run up and down stairs. Focus on doing something kind or helpful for someone else. Call a friend or family member to talk during a craving. Join a support group. Contact a quitline. Where to find support To get help or find a support group: Call the [facility]'s Smoking Quitline: 1-800-QUIT NOW 413-665-0866) Visit the website of the Substance Abuse and [place]: ktimeonline.com Text QUIT to SmokefreeTXTMQ:317211 Where to find more information Visit these websites to find more information on quitting smoking: [place]: www.smokefree.gov American Lung Association: www.lung.org American Cancer Society: www.cancer.org Centers for Disease Control and Prevention: http://www.wolf.info/ American Heart Association: www.heart.org Contact a health care provider if: You want  to change your plan for quitting. The medicines you are taking are not helping. Your eating feels out of control or you cannot sleep. Get help right away if: You feel depressed or become very anxious. Summary Quitting smoking is a physical and mental challenge. You will face cravings, withdrawal symptoms, and temptation to smoke again. Preparation can help you as you go through these challenges. Try different techniques to manage stress, handle social situations, and prevent weight gain. You can deal with cravings by  keeping your mouth busy (such as by chewing gum), keeping your hands and body busy, calling family or friends, or contacting a quitline for people who want to quit smoking. You can deal with withdrawal symptoms by avoiding places where people smoke, getting plenty of rest, and avoiding drinks with caffeine. This information is not intended to replace advice given to you by your health care provider. Make sure you discuss any questions you have with your healthcare provider. Document Revised: 04/16/2019 Document Reviewed: 04/16/2019 Elsevier Patient Education  [place].

## 2021-03-08 NOTE — Progress Notes (Signed)
Office Visit    Patient Name: Rhonda Hickman Date of Encounter: 03/08/2021  PCP:  [name], NP   [facility]  Cardiologist:  [name], MD  Advanced Practice Provider:  No care team member to display Electrophysiologist:  None      Chief Complaint    Rhonda Hickman is a 65 y.o. female with a hx of TIA, atrial fibrillation, COPD, DM2, asthma, atrial fibrillation on Xarelto presents today for follow-up of atrial fibrillation  Past Medical History    Past Medical History:  Diagnosis Date   Anemia    Anxiety    Asthma    Atrial fibrillation ([place])    Bipolar 1 disorder ([place])    Bulging lumbar disc    Chronic pain of left knee    COPD (chronic obstructive pulmonary disease) ([place])    CVA (cerebral vascular accident) ([place])    Diabetes mellitus    Neuropathy    TIA (transient ischemic attack)    Vertigo    Past Surgical History:  Procedure Laterality Date   ABDOMINAL HYSTERECTOMY     CARPAL TUNNEL RELEASE Right    CHOLECYSTECTOMY     KNEE ARTHROSCOPY WITH MEDIAL MENISECTOMY Right 04/14/2016   Procedure: KNEE ARTHROSCOPY WITH MEDIAL AND LATERAL MENISECTOMY, MICROFRACTURE REPAIR;  Surgeon: Carole Civil, MD;  Location: [facility];  Service: Orthopedics;  Laterality: Right;  lateral menisectomy - needs crutch training    Allergies  Allergies  Allergen Reactions   Aspirin Nausea Only and Other (See Comments)    Causes stomach pain    History of Present Illness    Rhonda Hickman is a 65 y.o. female with a hx of TIA, atrial fibrillation, COPD, DM2, asthma, atrial fibrillation on Xarelto last seen 08/05/2020 by Dr. Gardiner Rhyme.  Transthoracic echocardiogram 08/03/2019 LVEF 60 to 65%, RV normal function, normal biatrial size, no significant valvular disease.  Lexiscan Myoview June 2021 low risk stress test with breast attenuation but no ischemia, LVEF 48%.  Carotid duplex 01/14/2020 bilateral 1 to 39% stenosis.  Zio patch worn for 14  days initiated 01/29/2020 with 1% atrial flutter burden with average heart rate 86 bpm.  Zio patch worn for 3 days 05/25/2020 showed no significant arrhythmias.  She was hospitalized May 2021 in [place] and found to have AKI and UTI with atrial fibrillation.  She was started on Xarelto.  When last seen 08/05/2020 she noted occasional dizziness with certain head positions.  Orthostatic vital signs were negative.  She was recommended to take meclizine and follow-up with neurology.    She presents today for follow-up. She reports pain in her left knee and tells me she "kept falling out ".  She has noticed edema in her medial foot.  She has been referred to orthopedics and is awaiting on a consult.  Was told previously that she would have to quit smoking and get her A1c down prior to being cleared for surgery.  She is very interested in quitting smoking.  Currently smoking 2 to 2-1/2 packs over 3-day period.  We discussed methods for quitting smoking including patch, gum, Wellbutrin, counseling.  She is interested in Wellbutrin.  She notices stable dizziness to position changes and we discussed the importance of staying adequately hydrated and making position changes slowly.  She reports stable dyspnea on exertion.  Denies hematuria, melena.  Reports no chest pain, pressure, tightness.  Denies chest pain.  Reports occasional palpitations which are overall not bothersome and self resolve very  quickly.  EKGs/Labs/Other Studies Reviewed:   The following studies were reviewed today:   EKG:  EKG is ordered today.  The ekg ordered today demonstrates NSR 60 bpm with no acute ST/T wave changes.   Recent Labs: 03/17/2020: ALT 15; BUN 15; Creatinine, Ser 0.91; Potassium 4.2; Sodium 139; TSH 0.600 08/05/2020: Hemoglobin 12.8; Platelets 201  Recent Lipid Panel    Component Value Date/Time   CHOL 116 06/08/2020 1412   TRIG 125 06/08/2020 1412   HDL 26 (L) 06/08/2020 1412   CHOLHDL 4.5 (H) 06/08/2020 1412    CHOLHDL 11.2 08/04/2019 0548   VLDL 32 08/04/2019 0548   LDLCALC 67 06/08/2020 1412    Risk Assessment/Calculations:   CHA2DS2-VASc Score = 6  This indicates a 9.7% annual risk of stroke. The patient's score is based upon: CHF History: No HTN History: Yes Diabetes History: Yes Stroke History: Yes Vascular Disease History: Yes Age Score: 0 Gender Score: 1    Home Medications   Current Meds  Medication Sig   albuterol (VENTOLIN HFA) 108 (90 Base) MCG/ACT inhaler INHALE 2 PUFFS EVERY 6 HOURS AS NEEDED FOR WHEEZING OR SHORTNESS OF BREATH   amLODipine (NORVASC) 10 MG tablet TAKE 1 TABLET DAILY   budesonide-formoterol (SYMBICORT) 160-4.5 MCG/ACT inhaler Inhale 2 puffs into the lungs 2 (two) times daily.   buPROPion (WELLBUTRIN SR) 150 MG 12 hr tablet Take one tablet daily for 3 days. Then, take one tablet twice daily for 3 months.   calcium-vitamin D (OSCAL WITH D) 500-200 MG-UNIT tablet TAKE (1) TABLET DAILY WITH BREAKFAST.   citalopram (CELEXA) 40 MG tablet Take 1 tablet (40 mg total) by mouth daily.   divalproex (DEPAKOTE ER) 500 MG 24 hr tablet Take 500-1,000 mg by mouth See admin instructions. '500MG'$  in the morning and '1000mg'$  at bedtime   FEROSUL 325 (65 Fe) MG tablet TAKE (1) TABLET DAILY WITH BREAKFAST.   fluconazole (DIFLUCAN) 150 MG tablet Take 1 tablet (150 mg total) by mouth daily.   fluticasone (FLONASE) 50 MCG/ACT nasal spray Place 2 sprays into both nostrils daily.   gabapentin (NEURONTIN) 300 MG capsule Take 3 capsules (900 mg total) by mouth 3 (three) times daily.   GNP ULTICARE PEN NEEDLES 32G X 4 MM MISC    HYDROcodone-acetaminophen (NORCO) 10-325 MG tablet Take 1 tablet by mouth every 6 (six) hours as needed.   insulin glargine (LANTUS SOLOSTAR) 100 UNIT/ML Solostar Pen Inject 20 Units into the skin at bedtime.   insulin lispro (HUMALOG) 100 UNIT/ML cartridge 0-12 units 3 times daily before meals as per sliding scale   Insulin Syringe-Needle U-100 (ADVOCATE INSULIN  SYRINGE) 29G X 1/2" 0.3 ML MISC Use to inject insulin 5x daily.   JANUVIA 100 MG tablet TAKE 1 TABLET DAILY   meclizine (ANTIVERT) 12.5 MG tablet TAKE 1 TABLET 3 TIMES A DAY AS NEEDED FOR DIZZINESS   meloxicam (MOBIC) 7.5 MG tablet TAKE 1 TABLET DAILY   metFORMIN (GLUCOPHAGE) 1000 MG tablet Take 1 tablet (1,000 mg total) by mouth 2 (two) times daily with a meal.   metoprolol tartrate (LOPRESSOR) 25 MG tablet Take 1 tablet (25 mg total) by mouth 2 (two) times daily.   oxybutynin (DITROPAN) 5 MG tablet Take 1 tablet (5 mg total) by mouth 3 (three) times daily. TAKE  (1)  TABLET TWICE A DAY.   PEG-KCl-NaCl-NaSulf-Na Asc-C (PLENVU) 140 g SOLR Take 140 g by mouth as directed. Manufacturer's coupon Universal coupon code:BIN: [id number]; GROUP: [id number]; PCN: [id number]; IDCY:[id number]; PAY NO MORE $  50   phenazopyridine (PYRIDIUM) 100 MG tablet Take 1 tablet (100 mg total) by mouth 3 (three) times daily as needed for pain.   rosuvastatin (CRESTOR) 10 MG tablet Take 1 tablet (10 mg total) by mouth daily.   sulfamethoxazole-trimethoprim (BACTRIM DS) 800-160 MG tablet Take 1 tablet by mouth 2 (two) times daily.   Vitamin D, Ergocalciferol, (DRISDOL) 1.25 MG (50000 UNIT) CAPS capsule Take 1 capsule (50,000 Units total) by mouth every 7 (seven) days.   XARELTO 20 MG TABS tablet TAKE ONE TABLET BY MOUTH WITH SUPPER     Review of Systems      All other systems reviewed and are otherwise negative except as noted above.  Physical Exam    VS:  BP 128/74   Pulse 60   Ht 6' (1.829 m)   Wt 282 lb 3.2 oz (128 kg)   SpO2 99%   BMI 38.27 kg/m  , BMI Body mass index is 38.27 kg/m.  Wt Readings from Last 3 Encounters:  03/08/21 282 lb 3.2 oz (128 kg)  12/10/20 273 lb (123.8 kg)  12/08/20 273 lb 6.4 oz (124 kg)     GEN: Well nourished, well developed, in no acute distress. HEENT: normal. Neck: Supple, no JVD, carotid bruits, or masses. Cardiac: RRR, no murmurs, rubs, or gallops. No clubbing, cyanosis, edema.   Radials/PT 2+ and equal bilaterally.  Respiratory:  Respirations regular and unlabored, clear to auscultation bilaterally. GI: Soft, nontender, nondistended. MS: No deformity or atrophy. Skin: Warm and dry, no rash. Neuro:  Strength and sensation are intact. Psych: Normal affect.  Assessment & Plan    PAF / Chronic anticoagulation - Maintaining NSR. Continue Lopressor '25mg'$  BID and Xarelto '20mg'$  daily. CHA2DS2-VASc Score = 6 [CHF History: No, HTN History: Yes, Diabetes History: Yes, Stroke History: Yes, Vascular Disease History: Yes, Age Score: 0, Gender Score: 1].  Therefore, the patient's annual risk of stroke is 9.7 %.   Denies bleeding complications.   Dizziness - OVerall stable compared to previous. Previous CT head, brain MRI, orthostatic unremarkable. Follows with neurology. Recommend slow position changes, staying well hydrated. No near syncope nor syncope.   Tobacco use - Motivated to quit with goal to qualify for knee surgery. Cannot wear patch as it irritates her skin. We discussed counseling and Wellbutrin. She is interested in Wellbutrin, Rx 3 month course of Wellbutrin. We discussed the importance of setting a quit date. Handout on quitting smoking provided. Also recommend utilization of 1-800-QUITNOW. If she decides she wishes referral for therapy to discuss alternative coping methods to smokin, she will contact our office and we will refer to Elias Else, PsD.  Obesity - Weight loss via diet and exercise encouraged. Discussed the impact being overweight would have on cardiovascular risk, HTN, DM2.   HLD - Continue Rosuvastatin '10mg'$  daily. Risk reduction given CVA and DM2.  HTN - BP well controlled. Continue current antihypertensive regimen.  Heart healthy diet and regular cardiovascular exercise encouraged.    Disposition: Follow up in 6 month(s) with Dr. Gardiner Rhyme or APP.  Signed, Loel Dubonnet, NP 03/08/2021, 8:05 PM [place]

## 2021-03-09 DIAGNOSIS — M797 Fibromyalgia: Secondary | ICD-10-CM | POA: Diagnosis not present

## 2021-03-10 ENCOUNTER — Other Ambulatory Visit: Payer: Self-pay

## 2021-03-10 ENCOUNTER — Encounter (INDEPENDENT_AMBULATORY_CARE_PROVIDER_SITE_OTHER): Payer: Self-pay | Admitting: Primary Care

## 2021-03-10 ENCOUNTER — Ambulatory Visit (INDEPENDENT_AMBULATORY_CARE_PROVIDER_SITE_OTHER): Payer: Medicaid Other | Admitting: Primary Care

## 2021-03-10 VITALS — BP 110/64 | HR 60 | Temp 97.5°F | Ht 72.0 in | Wt 281.8 lb

## 2021-03-10 DIAGNOSIS — R269 Unspecified abnormalities of gait and mobility: Secondary | ICD-10-CM

## 2021-03-10 DIAGNOSIS — Z760 Encounter for issue of repeat prescription: Secondary | ICD-10-CM | POA: Diagnosis not present

## 2021-03-10 DIAGNOSIS — I152 Hypertension secondary to endocrine disorders: Secondary | ICD-10-CM

## 2021-03-10 DIAGNOSIS — F3340 Major depressive disorder, recurrent, in remission, unspecified: Secondary | ICD-10-CM

## 2021-03-10 DIAGNOSIS — I482 Chronic atrial fibrillation, unspecified: Secondary | ICD-10-CM | POA: Diagnosis not present

## 2021-03-10 DIAGNOSIS — E559 Vitamin D deficiency, unspecified: Secondary | ICD-10-CM | POA: Diagnosis not present

## 2021-03-10 DIAGNOSIS — E782 Mixed hyperlipidemia: Secondary | ICD-10-CM

## 2021-03-10 DIAGNOSIS — D509 Iron deficiency anemia, unspecified: Secondary | ICD-10-CM | POA: Diagnosis not present

## 2021-03-10 DIAGNOSIS — J449 Chronic obstructive pulmonary disease, unspecified: Secondary | ICD-10-CM

## 2021-03-10 DIAGNOSIS — Z794 Long term (current) use of insulin: Secondary | ICD-10-CM

## 2021-03-10 DIAGNOSIS — E1100 Type 2 diabetes mellitus with hyperosmolarity without nonketotic hyperglycemic-hyperosmolar coma (NKHHC): Secondary | ICD-10-CM | POA: Diagnosis not present

## 2021-03-10 DIAGNOSIS — Z7689 Persons encountering health services in other specified circumstances: Secondary | ICD-10-CM | POA: Diagnosis not present

## 2021-03-10 DIAGNOSIS — M797 Fibromyalgia: Secondary | ICD-10-CM | POA: Diagnosis not present

## 2021-03-10 LAB — POCT GLYCOSYLATED HEMOGLOBIN (HGB A1C): Hemoglobin A1C: 10.3 % — AB (ref 4.0–5.6)

## 2021-03-10 LAB — GLUCOSE, POCT (MANUAL RESULT ENTRY): POC Glucose: 203 mg/dl — AB (ref 70–99)

## 2021-03-10 MED ORDER — ALBUTEROL SULFATE HFA 108 (90 BASE) MCG/ACT IN AERS: INHALATION_SPRAY | RESPIRATORY_TRACT | 1 refills | Status: DC

## 2021-03-10 MED ORDER — HUMALOG 100 UNIT/ML ~~LOC~~ SOCT: SUBCUTANEOUS | 3 refills | Status: DC

## 2021-03-10 MED ORDER — AMLODIPINE BESYLATE 10 MG PO TABS: 10.0000 mg | ORAL_TABLET | Freq: Every day | ORAL | 1 refills | Status: DC

## 2021-03-10 MED ORDER — BUDESONIDE-FORMOTEROL FUMARATE 160-4.5 MCG/ACT IN AERO: 2.0000 | INHALATION_SPRAY | Freq: Two times a day (BID) | RESPIRATORY_TRACT | 3 refills | Status: DC

## 2021-03-10 MED ORDER — CITALOPRAM HYDROBROMIDE 40 MG PO TABS: 40.0000 mg | ORAL_TABLET | Freq: Every day | ORAL | 1 refills | Status: DC

## 2021-03-10 MED ORDER — LANTUS SOLOSTAR 100 UNIT/ML ~~LOC~~ SOPN: 20.0000 [IU] | PEN_INJECTOR | Freq: Two times a day (BID) | SUBCUTANEOUS | 3 refills | Status: DC

## 2021-03-10 MED ORDER — METFORMIN HCL 1000 MG PO TABS: 1000.0000 mg | ORAL_TABLET | Freq: Two times a day (BID) | ORAL | 1 refills | Status: DC

## 2021-03-10 NOTE — Progress Notes (Signed)
[facility]   Subjective:  Patient ID: Rhonda Hickman, female    DOB: [birth year]  Age: 65 y.o. MRN: [id number]  CC: Diabetes   HPI [name] presents for follow-up of diabetes. Patient does  check blood sugar at home. She voices recent falls with unstable gait - no throw rugs, good lighting and handicap accessible bathroom  Compliant with meds - Yes Checking CBGs? Yes  Fasting avg - 150-400  Postprandial average -  Exercising regularly? - Yes purchase  Watching carbohydrate intake? - Yes Neuropathy ? - Yes Hypoglycemic events - No  - Recovers with :   Pertinent ROS:  Polyuria - Yes Polydipsia - Yes Vision problems - Yes  Medications as noted below. Taking them regularly without complication/adverse reaction being reported today.   History Rhonda Hickman has a past medical history of Anemia, Anxiety, Asthma, Atrial fibrillation ([place]), Bipolar 1 disorder ([place]), Bulging lumbar disc, Chronic pain of left knee, COPD (chronic obstructive pulmonary disease) ([place]), CVA (cerebral vascular accident) ([place]), Diabetes mellitus, Neuropathy, TIA (transient ischemic attack), and Vertigo.   She has a past surgical history that includes Abdominal hysterectomy; Carpal tunnel release (Right); Knee arthroscopy with medial menisectomy (Right, 04/14/2016); and Cholecystectomy.   Her family history includes Diabetes in her mother; Heart attack in her father.She reports that she has been smoking cigarettes. She has been smoking an average of .5 packs per day. She has never used smokeless tobacco. She reports that she does not drink alcohol and does not use drugs.  Current Outpatient Medications on File Prior to Visit  Medication Sig Dispense Refill   buPROPion (WELLBUTRIN SR) 150 MG 12 hr tablet Take one tablet daily for 3 days. Then, take one tablet twice daily for 3 months. 60 tablet 2   calcium-vitamin D (OSCAL WITH D) 500-200 MG-UNIT tablet TAKE (1) TABLET DAILY WITH BREAKFAST. 90  tablet 1   FEROSUL 325 (65 Fe) MG tablet TAKE (1) TABLET DAILY WITH BREAKFAST. 90 tablet 0   fluticasone (FLONASE) 50 MCG/ACT nasal spray Place 2 sprays into both nostrils daily. 16 g 6   gabapentin (NEURONTIN) 300 MG capsule Take 3 capsules (900 mg total) by mouth 3 (three) times daily. 810 capsule 3   GNP ULTICARE PEN NEEDLES 32G X 4 MM MISC      HYDROcodone-acetaminophen (NORCO) 10-325 MG tablet Take 1 tablet by mouth every 6 (six) hours as needed.     Insulin Syringe-Needle U-100 (ADVOCATE INSULIN SYRINGE) 29G X 1/2" 0.3 ML MISC Use to inject insulin 5x daily. 200 each 6   JANUVIA 100 MG tablet TAKE 1 TABLET DAILY 90 tablet 1   meclizine (ANTIVERT) 12.5 MG tablet TAKE 1 TABLET 3 TIMES A DAY AS NEEDED FOR DIZZINESS 30 tablet 0   metoprolol tartrate (LOPRESSOR) 25 MG tablet Take 1 tablet (25 mg total) by mouth 2 (two) times daily. 180 tablet 3   oxybutynin (DITROPAN) 5 MG tablet Take 1 tablet (5 mg total) by mouth 3 (three) times daily. TAKE  (1)  TABLET TWICE A DAY. 270 tablet 1   PEG-KCl-NaCl-NaSulf-Na Asc-C (PLENVU) 140 g SOLR Take 140 g by mouth as directed. Manufacturer's coupon Universal coupon code:BIN: [id number]; GROUP: [id number]; PCN: [id number]; ID: [id number]; PAY NO MORE $50 1 each 0   rosuvastatin (CRESTOR) 10 MG tablet Take 1 tablet (10 mg total) by mouth daily. 90 tablet 2   Vitamin D, Ergocalciferol, (DRISDOL) 1.25 MG (50000 UNIT) CAPS capsule Take 1 capsule (50,000 Units total) by mouth every 7 (seven) days.  32 capsule 0   XARELTO 20 MG TABS tablet TAKE ONE TABLET BY MOUTH WITH SUPPER 30 tablet 1   No current facility-administered medications on file prior to visit.    ROS Review of Systems  Eyes:  Positive for visual disturbance.  Respiratory:  Positive for shortness of breath and wheezing.   Cardiovascular:  Positive for chest pain.       Seen cardiology yesterday   Genitourinary:  Positive for frequency and urgency.  Musculoskeletal:  Positive for gait problem.   Neurological:  Positive for speech difficulty, weakness and numbness.       Tingling - diabetic neuropathy   All other systems reviewed and are negative.  Objective:  BP 110/64 (BP Location: Left Arm, Patient Position: Sitting, Cuff Size: Large)   Pulse 60   Temp (!) 97.5 F (36.4 C) (Temporal)   Ht 6' (1.829 m)   Wt 281 lb 12.8 oz (127.8 kg)   SpO2 99%   BMI 38.22 kg/m   BP Readings from Last 3 Encounters:  03/10/21 110/64  03/08/21 128/74  12/10/20 125/74    Wt Readings from Last 3 Encounters:  03/10/21 281 lb 12.8 oz (127.8 kg)  03/08/21 282 lb 3.2 oz (128 kg)  12/10/20 273 lb (123.8 kg)    Physical Exam  General: Vital signs reviewed.  Patient is well-developed and well-nourished,obese female  in no acute distress and cooperative with exam.  Head: Normocephalic and atraumatic. Eyes: EOMI, conjunctivae normal, no scleral icterus.  Neck: Supple, trachea midline, normal ROM, no JVD, masses, thyromegaly, or carotid bruit present.  Cardiovascular: RRR, S1 normal, S2 normal, no murmurs, gallops, or rubs. Pulmonary/Chest: Clear to auscultation bilaterally, no wheezes, rales, or rhonchi. Abdominal: Soft, non-tender, non-distended, BS +, no masses, organomegaly, or guarding present.  Musculoskeletal: No joint deformities, erythema, or stiffness, ROM full and nontender. Extremities: No lower extremity edema bilaterally,  pulses symmetric and intact bilaterally. No cyanosis or clubbing. Neurological: A&O x3, Strength is decrease 3/5 symmetric bilaterally, focal motor deficit bilateral hands  sensory intact to light touch bilaterally.  Skin: Warm, dry and intact. No rashes or erythema. Psychiatric: Normal mood and affect. speech and behavior is normal. Cognition and memory are normal.   Lab Results  Component Value Date   HGBA1C 10.3 (A) 03/10/2021   HGBA1C 9.4 (A) 12/08/2020   HGBA1C 10.7 (A) 09/07/2020    Lab Results  Component Value Date   WBC 8.8 08/05/2020   HGB 12.8  08/05/2020   HCT 38.9 08/05/2020   PLT 201 08/05/2020   GLUCOSE 203 (H) 03/10/2021   CHOL 100 03/10/2021   TRIG 135 03/10/2021   HDL 28 (L) 03/10/2021   LDLCALC 48 03/10/2021   ALT 34 (H) 03/10/2021   AST 20 03/10/2021   NA 140 03/10/2021   K 5.3 (H) 03/10/2021   CL 99 03/10/2021   CREATININE 1.03 (H) 03/10/2021   BUN 14 03/10/2021   CO2 25 03/10/2021   TSH 0.600 03/17/2020   HGBA1C 10.3 (A) 03/10/2021     Assessment & Plan:  Rhonda Hickman was seen today for diabetes.  Diagnoses and all orders for this visit:  Type 2 diabetes mellitus with 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, with long-term current use of insulin (HCC) Decrease foods that are high in carbohydrates are the following rice, potatoes, breads, sugars, and pastas.  Reduction in the intake (eating) will assist in lowering your blood sugars.  -     HgB A1c 10.3 adjusted medication increase Lantus 20 units BID and  JANUVIA 100 MG tablet  daily  -     Glucose (CBG) -     Microalbumin/Creatinine Ratio, Urine -     insulin glargine (LANTUS SOLOSTAR) 100 UNIT/ML Solostar Pen; Inject 20 Units into the skin 2 (two) times daily. -     insulin lispro (HUMALOG) 100 UNIT/ML cartridge; 0-12 units 3 times daily before meals as per sliding scale -     Ambulatory referral to Ophthalmology  Medication refill -     albuterol (VENTOLIN HFA) 108 (90 Base) MCG/ACT inhaler; INHALE 2 PUFFS EVERY 6 HOURS AS NEEDED FOR WHEEZING OR SHORTNESS OF BREATH -     amLODipine (NORVASC) 10 MG tablet; Take 1 tablet (10 mg total) by mouth daily. -     budesonide-formoterol (SYMBICORT) 160-4.5 MCG/ACT inhaler; Inhale 2 puffs into the lungs 2 (two) times daily. -     citalopram (CELEXA) 40 MG tablet; Take 1 tablet (40 mg total) by mouth daily. -     insulin glargine (LANTUS SOLOSTAR) 100 UNIT/ML Solostar Pen; Inject 20 Units into the skin 2 (two) times daily. -     insulin lispro (HUMALOG) 100 UNIT/ML cartridge; 0-12 units 3 times daily before meals as per sliding  scale -     metFORMIN (GLUCOPHAGE) 1000 MG tablet; Take 1 tablet (1,000 mg total) by mouth 2 (two) times daily with a meal.  Mixed hyperlipidemia  Healthy lifestyle diet of fruits vegetables fish nuts whole grains and low saturated fat . Foods high in cholesterol or liver, fatty meats,cheese, butter avocados, nuts and seeds, chocolate and fried foods. -     Lipid Panel  Chronic atrial fibrillation ([place]) Managed by cardiology   Recurrent major depressive disorder, in remission ([place]) Manage with -  citalopram (CELEXA) 40 MG tablet; Take 1 tablet (40 mg total) by mouth daily.  COPD mixed type ([place]) -     budesonide-formoterol (SYMBICORT) 160-4.5 MCG/ACT inhaler; Inhale 2 puffs into the lungs 2 (two) times daily.  Gait abnormality Uses a rollator for stability   Hypertension due to endocrine disorder lood pressure goal of less than 130/80, low-sodium, DASH diet, medication compliance, 150 minutes of moderate intensity exercise per week. Discussed medication compliance, adverse effects.  -     CMP14+EGFR -     amLODipine (NORVASC) 10 MG tablet; Take 1 tablet (10 mg total) by mouth daily.  Iron deficiency anemia, unspecified iron deficiency anemia type Iron rich foods such as shellfish,liver, organ meats(liver, gizzard), and red meats can increase cholesterol and should be consumed in moderation.However; legumes(beans), spinach, pumpkin seeds, [place], broccoli, tofu, green leafy vegetables and dark chocolate can be consumed without concern to cholesterol.   Cbc with diff  Vitamin D deficiency Vitamin D is needed to make and keep bones strong. The patient will need to take a prescription strength vitamin D tablet once weekly until next appointment.  Vitamin D level will be rechecked at future visit.  I have sent the vitamin D tablet to the pharmacy and it should be ready for pick up.  Please remind patient of upcoming appointments and/or schedule for follow-up if needed in 6-8 weeks.  -      Vitamin D, 25-hydroxy    I have discontinued Dafne Sobotka's divalproex, phenazopyridine, sulfamethoxazole-trimethoprim, fluconazole, and meloxicam. I have also changed her Lantus SoloStar. Additionally, I am having her maintain her Vitamin D (Ergocalciferol), Plenvu, GNP UltiCare Pen Needles, HYDROcodone-acetaminophen, fluticasone, meclizine, oxybutynin, Advocate Insulin Syringe, calcium-vitamin D, rosuvastatin, FeroSul, gabapentin, metoprolol tartrate, Xarelto, Januvia, buPROPion, albuterol, amLODipine,  budesonide-formoterol, citalopram, HumaLOG, and metFORMIN.  Meds ordered this encounter  Medications   DISCONTD: amLODipine (NORVASC) 10 MG tablet    Sig: Take 1 tablet (10 mg total) by mouth daily.    Dispense:  90 tablet    Refill:  1   DISCONTD: budesonide-formoterol (SYMBICORT) 160-4.5 MCG/ACT inhaler    Sig: Inhale 2 puffs into the lungs 2 (two) times daily.    Dispense:  1 each    Refill:  3   DISCONTD: citalopram (CELEXA) 40 MG tablet    Sig: Take 1 tablet (40 mg total) by mouth daily.    Dispense:  90 tablet    Refill:  1   albuterol (VENTOLIN HFA) 108 (90 Base) MCG/ACT inhaler    Sig: INHALE 2 PUFFS EVERY 6 HOURS AS NEEDED FOR WHEEZING OR SHORTNESS OF BREATH    Dispense:  18 g    Refill:  1   amLODipine (NORVASC) 10 MG tablet    Sig: Take 1 tablet (10 mg total) by mouth daily.    Dispense:  90 tablet    Refill:  1   budesonide-formoterol (SYMBICORT) 160-4.5 MCG/ACT inhaler    Sig: Inhale 2 puffs into the lungs 2 (two) times daily.    Dispense:  1 each    Refill:  3   citalopram (CELEXA) 40 MG tablet    Sig: Take 1 tablet (40 mg total) by mouth daily.    Dispense:  90 tablet    Refill:  1   insulin glargine (LANTUS SOLOSTAR) 100 UNIT/ML Solostar Pen    Sig: Inject 20 Units into the skin 2 (two) times daily.    Dispense:  15 mL    Refill:  3   insulin lispro (HUMALOG) 100 UNIT/ML cartridge    Sig: 0-12 units 3 times daily before meals as per sliding scale     Dispense:  15 mL    Refill:  3    For blood sugars 0-150 give 0 units of insulin, 151-200 give 2 units of insulin, 201-250 give 4 units, 251-300 give 6 units, 301-350 give 8 units, 351-400 give 10 units,> 400 give 12 units and call M.D.   metFORMIN (GLUCOPHAGE) 1000 MG tablet    Sig: Take 1 tablet (1,000 mg total) by mouth 2 (two) times daily with a meal.    Dispense:  180 tablet    Refill:  1     Follow-up:   Return for keepscheduled appt.  The above assessment and management plan was discussed with the patient. The patient verbalized understanding of and has agreed to the management plan. Patient is aware to call the clinic if symptoms fail to improve or worsen. Patient is aware when to return to the clinic for a follow-up visit. Patient educated on when it is appropriate to go to the emergency department.   [name], NP-C

## 2021-03-11 ENCOUNTER — Ambulatory Visit: Payer: Medicaid Other | Admitting: Orthopedic Surgery

## 2021-03-11 DIAGNOSIS — M797 Fibromyalgia: Secondary | ICD-10-CM | POA: Diagnosis not present

## 2021-03-11 LAB — CMP14+EGFR
ALT: 34 IU/L — ABNORMAL HIGH (ref 0–32)
AST: 20 IU/L (ref 0–40)
Albumin/Globulin Ratio: 1.4 (ref 1.2–2.2)
Albumin: 4.5 g/dL (ref 3.8–4.8)
Alkaline Phosphatase: 67 IU/L (ref 44–121)
BUN/Creatinine Ratio: 14 (ref 12–28)
BUN: 14 mg/dL (ref 8–27)
Bilirubin Total: <0.2 mg/dL (ref 0.0–1.2)
CO2: 25 mmol/L (ref 20–29)
Calcium: 10.1 mg/dL (ref 8.7–10.3)
Chloride: 99 mmol/L (ref 96–106)
Creatinine, Ser: 1.03 mg/dL — ABNORMAL HIGH (ref 0.57–1.00)
Globulin, Total: 3.3 g/dL (ref 1.5–4.5)
Glucose: 203 mg/dL — ABNORMAL HIGH (ref 65–99)
Potassium: 5.3 mmol/L — ABNORMAL HIGH (ref 3.5–5.2)
Sodium: 140 mmol/L (ref 134–144)
Total Protein: 7.8 g/dL (ref 6.0–8.5)
eGFR: 61 mL/min/{1.73_m2} (ref >59)

## 2021-03-11 LAB — LIPID PANEL
Chol/HDL Ratio: 3.6 ratio (ref 0.0–4.4)
Cholesterol, Total: 100 mg/dL (ref 100–199)
HDL: 28 mg/dL — ABNORMAL LOW (ref >39)
LDL Chol Calc (NIH): 48 mg/dL (ref 0–99)
Triglycerides: 135 mg/dL (ref 0–149)
VLDL Cholesterol Cal: 24 mg/dL (ref 5–40)

## 2021-03-11 LAB — MICROALBUMIN / CREATININE URINE RATIO
Creatinine, Urine: 203 mg/dL
Microalb/Creat Ratio: 21 mg/g creat (ref 0–29)
Microalbumin, Urine: 43.4 ug/mL

## 2021-03-11 LAB — VITAMIN D 25 HYDROXY (VIT D DEFICIENCY, FRACTURES): Vit D, 25-Hydroxy: 35.3 ng/mL (ref 30.0–100.0)

## 2021-03-12 DIAGNOSIS — M797 Fibromyalgia: Secondary | ICD-10-CM | POA: Diagnosis not present

## 2021-03-13 DIAGNOSIS — M797 Fibromyalgia: Secondary | ICD-10-CM | POA: Diagnosis not present

## 2021-03-14 DIAGNOSIS — M797 Fibromyalgia: Secondary | ICD-10-CM | POA: Diagnosis not present

## 2021-03-16 ENCOUNTER — Telehealth (INDEPENDENT_AMBULATORY_CARE_PROVIDER_SITE_OTHER): Payer: Self-pay

## 2021-03-16 DIAGNOSIS — M797 Fibromyalgia: Secondary | ICD-10-CM | POA: Diagnosis not present

## 2021-03-16 NOTE — Telephone Encounter (Signed)
Patient is aware of normal labs. Advised on exercise, diet and water intake. Nat Christen, CMA

## 2021-03-16 NOTE — Telephone Encounter (Signed)
-----   Message from [name], NP sent at 03/15/2021  7:24 PM EDT ----- Labs are  normal.  Make sure you are drinking at least 48 oz of water per day. Work on eating a low fat, heart healthy diet and participate in regular aerobic exercise program to control as well. Exercise at least  30 minutes per day-5 days per week. Avoid red meat. No fried foods. No junk foods, sodas, sugary foods or drinks, unhealthy snacking, alcohol or smoking.

## 2021-03-17 DIAGNOSIS — M797 Fibromyalgia: Secondary | ICD-10-CM | POA: Diagnosis not present

## 2021-03-18 ENCOUNTER — Other Ambulatory Visit: Payer: Medicaid Other | Admitting: Adult Health

## 2021-03-18 ENCOUNTER — Other Ambulatory Visit (INDEPENDENT_AMBULATORY_CARE_PROVIDER_SITE_OTHER): Payer: Self-pay | Admitting: Primary Care

## 2021-03-18 DIAGNOSIS — M797 Fibromyalgia: Secondary | ICD-10-CM | POA: Diagnosis not present

## 2021-03-18 DIAGNOSIS — Z7689 Persons encountering health services in other specified circumstances: Secondary | ICD-10-CM | POA: Diagnosis not present

## 2021-03-18 MED ORDER — INSULIN ASPART 100 UNIT/ML IJ SOLN: 12.0000 [IU] | Freq: Three times a day (TID) | INTRAMUSCULAR | 99 refills | Status: DC

## 2021-03-19 DIAGNOSIS — E119 Type 2 diabetes mellitus without complications: Secondary | ICD-10-CM | POA: Diagnosis not present

## 2021-03-20 DIAGNOSIS — E119 Type 2 diabetes mellitus without complications: Secondary | ICD-10-CM | POA: Diagnosis not present

## 2021-03-21 DIAGNOSIS — E119 Type 2 diabetes mellitus without complications: Secondary | ICD-10-CM | POA: Diagnosis not present

## 2021-03-22 DIAGNOSIS — E119 Type 2 diabetes mellitus without complications: Secondary | ICD-10-CM | POA: Diagnosis not present

## 2021-03-23 DIAGNOSIS — E119 Type 2 diabetes mellitus without complications: Secondary | ICD-10-CM | POA: Diagnosis not present

## 2021-03-24 DIAGNOSIS — E119 Type 2 diabetes mellitus without complications: Secondary | ICD-10-CM | POA: Diagnosis not present

## 2021-03-25 ENCOUNTER — Other Ambulatory Visit: Payer: Self-pay

## 2021-03-25 ENCOUNTER — Encounter: Payer: Self-pay | Admitting: Orthopedic Surgery

## 2021-03-25 ENCOUNTER — Ambulatory Visit: Payer: Medicaid Other | Admitting: Orthopedic Surgery

## 2021-03-25 VITALS — BP 119/73 | HR 64 | Ht 72.0 in | Wt 280.0 lb

## 2021-03-25 DIAGNOSIS — G8929 Other chronic pain: Secondary | ICD-10-CM

## 2021-03-25 DIAGNOSIS — S8392XA Sprain of unspecified site of left knee, initial encounter: Secondary | ICD-10-CM | POA: Diagnosis not present

## 2021-03-25 DIAGNOSIS — Z7689 Persons encountering health services in other specified circumstances: Secondary | ICD-10-CM | POA: Diagnosis not present

## 2021-03-25 DIAGNOSIS — M25562 Pain in left knee: Secondary | ICD-10-CM | POA: Diagnosis not present

## 2021-03-25 DIAGNOSIS — E119 Type 2 diabetes mellitus without complications: Secondary | ICD-10-CM | POA: Diagnosis not present

## 2021-03-25 NOTE — Patient Instructions (Signed)
While we are working on your approval for MRI please go ahead and call to schedule your appointment with [facility] within at least one (1) week.   Central Scheduling [phone]  

## 2021-03-25 NOTE — Progress Notes (Signed)
NEW PROBLEM//OFFICE VISIT  Summary assessment and plan:   Encounter Diagnoses  Name Primary?   Acute pain of left knee    Sprain of left knee, unspecified ligament, initial encounter Yes   Chronic pain of left knee    65 year old female with chronic pain left knee fell onto the left knee and had increased pain.  The fall was in July as the pain is worse.  The pain is located laterally.  She is on Norco 10 and Xarelto.  Could have had a spontaneous bleed into the knee versus knee sprain recommend MRI   Chief Complaint  Patient presents with   Knee Pain   Foot Pain    Left knee pain and swelling since a fall she had in July and has gotten worse.    65 year old female with chronic left knee pain considering total knee at a tertiary care facility  Hemoglobin A1c is 10  She fell and injured her left knee again back in July and the pain is gotten worse since then.  She has lateral knee pain she is on hydrocodone 10 mg has not improved she is also on Xarelto so there is the possibility of a bleed into the joint      MEDICAL DECISION MAKING  A.  Encounter Diagnoses  Name Primary?   Acute pain of left knee    Sprain of left knee, unspecified ligament, initial encounter Yes   Chronic pain of left knee     B. 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 MANAGEMENT   Nonoperative management order MRI evaluate intra-articular structures for possible tear  No orders of the defined types were placed in this encounter.    BP 119/73   Pulse 64   Ht 6' (1.829 m)   Wt 280 lb (127 kg)   BMI 37.97  kg/m    General appearance: Well-developed well-nourished no gross deformities  Cardiovascular normal pulse and perfusion normal color without edema  Neurologically no sensation loss or deficits or pathologic reflexes  Psychological: Awake alert and oriented x3 mood and affect normal  Skin no lacerations or ulcerations no nodularity no palpable masses, no erythema or nodularity  Musculoskeletal: Left knee effusion lateral joint line tenderness, passive range of motion is normal, trace positive anterior drawer, muscle tone normal   ROS Chronic pain, no chest pain shortness of breath.  Past Medical History:  Diagnosis Date   Anemia    Anxiety    Asthma    Atrial fibrillation ([facility])    Bipolar 1 disorder (HCC)    Bulging lumbar disc    Chronic pain of left knee    COPD (chronic obstructive pulmonary disease) (H[facility])    CVA (cerebral vascular accident) ([facility])    Diabetes mellitus    Neuropathy    TIA (transient ischemic attack)    Vertigo     Past Surgical History:  Procedure Laterality Date   ABDOMINAL HYSTERECTOMY     CARPAL TUNNEL RELEASE Right    CHOLECYSTECTOMY     KNEE ARTHROSCOPY WITH MEDIAL MENISECTOMY Right 04/14/2016   Procedure: KNEE ARTHROSCOPY WITH MEDIAL  AND LATERAL MENISECTOMY, MICROFRACTURE REPAIR;  Surgeon: Carole Civil, MD;  Location: [facility];  Service: Orthopedics;  Laterality: Right;  lateral menisectomy - needs crutch training    Family History  Problem Relation Age of Onset   Diabetes Mother    Heart attack Father    Social History   Tobacco Use   Smoking status: Some Days    Packs/day: 0.50    Types: Cigarettes   Smokeless tobacco: Never   Tobacco comments:    tobacco info given  Vaping Use   Vaping Use: Some days  Substance Use Topics   Alcohol use: No   Drug use: No    Allergies  Allergen Reactions   Aspirin Nausea Only and Other (See Comments)    Causes stomach pain    Current Meds  Medication Sig   albuterol (VENTOLIN  HFA) 108 (90 Base) MCG/ACT inhaler INHALE 2 PUFFS EVERY 6 HOURS AS NEEDED FOR WHEEZING OR SHORTNESS OF BREATH   amLODipine (NORVASC) 10 MG tablet Take 1 tablet (10 mg total) by mouth daily.   budesonide-formoterol (SYMBICORT) 160-4.5 MCG/ACT inhaler Inhale 2 puffs into the lungs 2 (two) times daily.   buPROPion (WELLBUTRIN SR) 150 MG 12 hr tablet Take one tablet daily for 3 days. Then, take one tablet twice daily for 3 months.   calcium-vitamin D (OSCAL WITH D) 500-200 MG-UNIT tablet TAKE (1) TABLET DAILY WITH BREAKFAST.   citalopram (CELEXA) 40 MG tablet Take 1 tablet (40 mg total) by mouth daily.   FEROSUL 325 (65 Fe) MG tablet TAKE (1) TABLET DAILY WITH BREAKFAST.   fluticasone (FLONASE) 50 MCG/ACT nasal spray Place 2 sprays into both nostrils daily.   gabapentin (NEURONTIN) 300 MG capsule Take 3 capsules (900 mg total) by mouth 3 (three) times daily.   GNP ULTICARE PEN NEEDLES 32G X 4 MM MISC    HYDROcodone-acetaminophen (NORCO) 10-325 MG tablet Take 1 tablet by mouth every 6 (six) hours as needed.   insulin glargine (LANTUS SOLOSTAR) 100 UNIT/ML Solostar Pen Inject 20 Units into the skin 2 (two) times daily.   Insulin Syringe-Needle U-100 (ADVOCATE INSULIN SYRINGE) 29G X 1/2" 0.3 ML MISC Use to inject insulin 5x daily.   JANUVIA 100 MG tablet TAKE 1 TABLET DAILY   meclizine (ANTIVERT) 12.5 MG tablet TAKE 1 TABLET 3 TIMES A DAY AS NEEDED FOR DIZZINESS   metFORMIN (GLUCOPHAGE) 1000 MG tablet Take 1 tablet (1,000 mg total) by mouth 2 (two) times daily with a meal.   metoprolol tartrate (LOPRESSOR) 25 MG tablet Take 1 tablet (25 mg total) by mouth 2 (two) times daily.   oxybutynin (DITROPAN) 5 MG tablet Take 1 tablet (5 mg total) by mouth 3 (three) times daily. TAKE  (1)  TABLET TWICE A DAY.   PEG-KCl-NaCl-NaSulf-Na Asc-C (PLENVU) 140 g SOLR Take 140 g by mouth as directed. Manufacturer's coupon Universal coupon code:BIN: C1589615; GROUPSE:[id number]; PCN: [id number]; IDCY:[id number]; PAY NO MORE $50    Vitamin D, Ergocalciferol, (DRISDOL) 1.25 MG (50000 UNIT) CAPS capsule Take 1 capsule (50,000 Units total) by mouth every 7 (seven) days.   XARELTO 20 MG TABS tablet TAKE ONE TABLET BY MOUTH WITH SUPPER        Arther Abbott, MD  03/25/2021 2:28 PM

## 2021-03-26 ENCOUNTER — Other Ambulatory Visit (INDEPENDENT_AMBULATORY_CARE_PROVIDER_SITE_OTHER): Payer: Self-pay | Admitting: Primary Care

## 2021-03-26 DIAGNOSIS — E119 Type 2 diabetes mellitus without complications: Secondary | ICD-10-CM | POA: Diagnosis not present

## 2021-03-26 NOTE — Telephone Encounter (Signed)
Sent to PCP ?

## 2021-03-27 DIAGNOSIS — E119 Type 2 diabetes mellitus without complications: Secondary | ICD-10-CM | POA: Diagnosis not present

## 2021-03-28 DIAGNOSIS — E119 Type 2 diabetes mellitus without complications: Secondary | ICD-10-CM | POA: Diagnosis not present

## 2021-03-29 ENCOUNTER — Telehealth (INDEPENDENT_AMBULATORY_CARE_PROVIDER_SITE_OTHER): Payer: Self-pay | Admitting: Primary Care

## 2021-03-29 DIAGNOSIS — E119 Type 2 diabetes mellitus without complications: Secondary | ICD-10-CM | POA: Diagnosis not present

## 2021-03-29 NOTE — Telephone Encounter (Signed)
Pt is calling to request a letter stating that she needs 24 hour care to obtain an apartment in [facility] in [place]. Please advise Pt is requesting a CB- [phone] prior to the letter being faxed.  [name]

## 2021-03-29 NOTE — Telephone Encounter (Signed)
Sent to PCP ?

## 2021-03-30 DIAGNOSIS — E119 Type 2 diabetes mellitus without complications: Secondary | ICD-10-CM | POA: Diagnosis not present

## 2021-03-31 DIAGNOSIS — H2513 Age-related nuclear cataract, bilateral: Secondary | ICD-10-CM | POA: Diagnosis not present

## 2021-03-31 DIAGNOSIS — E119 Type 2 diabetes mellitus without complications: Secondary | ICD-10-CM | POA: Diagnosis not present

## 2021-03-31 DIAGNOSIS — H40033 Anatomical narrow angle, bilateral: Secondary | ICD-10-CM | POA: Diagnosis not present

## 2021-04-01 ENCOUNTER — Encounter (INDEPENDENT_AMBULATORY_CARE_PROVIDER_SITE_OTHER): Payer: Self-pay | Admitting: Primary Care

## 2021-04-01 DIAGNOSIS — H5213 Myopia, bilateral: Secondary | ICD-10-CM | POA: Diagnosis not present

## 2021-04-01 DIAGNOSIS — E119 Type 2 diabetes mellitus without complications: Secondary | ICD-10-CM | POA: Diagnosis not present

## 2021-04-02 DIAGNOSIS — E119 Type 2 diabetes mellitus without complications: Secondary | ICD-10-CM | POA: Diagnosis not present

## 2021-04-02 DIAGNOSIS — F329 Major depressive disorder, single episode, unspecified: Secondary | ICD-10-CM | POA: Diagnosis not present

## 2021-04-03 DIAGNOSIS — E119 Type 2 diabetes mellitus without complications: Secondary | ICD-10-CM | POA: Diagnosis not present

## 2021-04-04 DIAGNOSIS — E119 Type 2 diabetes mellitus without complications: Secondary | ICD-10-CM | POA: Diagnosis not present

## 2021-04-05 DIAGNOSIS — E119 Type 2 diabetes mellitus without complications: Secondary | ICD-10-CM | POA: Diagnosis not present

## 2021-04-05 DIAGNOSIS — F329 Major depressive disorder, single episode, unspecified: Secondary | ICD-10-CM | POA: Diagnosis not present

## 2021-04-06 ENCOUNTER — Ambulatory Visit (HOSPITAL_COMMUNITY)
Admission: RE | Admit: 2021-04-06 | Discharge: 2021-04-06 | Disposition: A | Discharge status: Home / Self Care | Payer: Medicaid Other | Source: Ambulatory Visit | Attending: Orthopedic Surgery | Admitting: Orthopedic Surgery

## 2021-04-06 ENCOUNTER — Other Ambulatory Visit: Payer: Self-pay

## 2021-04-06 ENCOUNTER — Other Ambulatory Visit (INDEPENDENT_AMBULATORY_CARE_PROVIDER_SITE_OTHER): Payer: Self-pay | Admitting: Family Medicine

## 2021-04-06 DIAGNOSIS — M25561 Pain in right knee: Secondary | ICD-10-CM | POA: Diagnosis not present

## 2021-04-06 DIAGNOSIS — G8929 Other chronic pain: Secondary | ICD-10-CM | POA: Diagnosis not present

## 2021-04-06 DIAGNOSIS — M5416 Radiculopathy, lumbar region: Secondary | ICD-10-CM | POA: Diagnosis not present

## 2021-04-06 DIAGNOSIS — M25562 Pain in left knee: Secondary | ICD-10-CM | POA: Insufficient documentation

## 2021-04-06 DIAGNOSIS — Z79899 Other long term (current) drug therapy: Secondary | ICD-10-CM | POA: Diagnosis not present

## 2021-04-06 DIAGNOSIS — E119 Type 2 diabetes mellitus without complications: Secondary | ICD-10-CM | POA: Diagnosis not present

## 2021-04-07 DIAGNOSIS — E119 Type 2 diabetes mellitus without complications: Secondary | ICD-10-CM | POA: Diagnosis not present

## 2021-04-08 DIAGNOSIS — E119 Type 2 diabetes mellitus without complications: Secondary | ICD-10-CM | POA: Diagnosis not present

## 2021-04-09 DIAGNOSIS — E119 Type 2 diabetes mellitus without complications: Secondary | ICD-10-CM | POA: Diagnosis not present

## 2021-04-10 DIAGNOSIS — E119 Type 2 diabetes mellitus without complications: Secondary | ICD-10-CM | POA: Diagnosis not present

## 2021-04-11 DIAGNOSIS — E119 Type 2 diabetes mellitus without complications: Secondary | ICD-10-CM | POA: Diagnosis not present

## 2021-04-12 DIAGNOSIS — E119 Type 2 diabetes mellitus without complications: Secondary | ICD-10-CM | POA: Diagnosis not present

## 2021-04-13 DIAGNOSIS — E119 Type 2 diabetes mellitus without complications: Secondary | ICD-10-CM | POA: Diagnosis not present

## 2021-04-14 DIAGNOSIS — E119 Type 2 diabetes mellitus without complications: Secondary | ICD-10-CM | POA: Diagnosis not present

## 2021-04-15 ENCOUNTER — Ambulatory Visit (INDEPENDENT_AMBULATORY_CARE_PROVIDER_SITE_OTHER): Payer: Medicaid Other | Admitting: Orthopedic Surgery

## 2021-04-15 ENCOUNTER — Other Ambulatory Visit: Payer: Self-pay

## 2021-04-15 VITALS — BP 134/75 | HR 64 | Ht 72.0 in | Wt 280.0 lb

## 2021-04-15 DIAGNOSIS — G8929 Other chronic pain: Secondary | ICD-10-CM

## 2021-04-15 DIAGNOSIS — M1712 Unilateral primary osteoarthritis, left knee: Secondary | ICD-10-CM

## 2021-04-15 DIAGNOSIS — M23322 Other meniscus derangements, posterior horn of medial meniscus, left knee: Secondary | ICD-10-CM | POA: Diagnosis not present

## 2021-04-15 DIAGNOSIS — M25562 Pain in left knee: Secondary | ICD-10-CM

## 2021-04-15 DIAGNOSIS — M1710 Unilateral primary osteoarthritis, unspecified knee: Secondary | ICD-10-CM

## 2021-04-15 DIAGNOSIS — M23301 Other meniscus derangements, unspecified lateral meniscus, left knee: Secondary | ICD-10-CM | POA: Diagnosis not present

## 2021-04-15 DIAGNOSIS — E119 Type 2 diabetes mellitus without complications: Secondary | ICD-10-CM | POA: Diagnosis not present

## 2021-04-15 NOTE — Patient Instructions (Signed)
The patient will need a medical consult  She will also need a cardiology consult to manage her Xarelto  And she will need an arthroscopy of the left knee for medial lateral meniscectomy  She will need 6 weeks to recover from the surgery  We expect she will get 75 to 80% pain relief and reserve total knee replacements for later date depending on symptoms.  Meniscus Injury, Arthroscopy   Arthroscopy is a surgical procedure that involves the use of a small scope that has a camera and surgical instruments on the end (arthroscope). An arthroscope can be used to repair your meniscus injury.  LET [facility] CARE PROVIDER KNOW ABOUT: Any allergies you have. All medicines you are taking, including vitamins, herbs, eyedrops, creams, and over-the-counter medicines. Any recent colds or infections you have had or currently have. Previous problems you or members of your family have had with the use of anesthetics. Any blood disorders or blood clotting problems you have. Previous surgeries you have had. Medical conditions you have. RISKS AND COMPLICATIONS Generally, this is a safe procedure. However, as with any procedure, problems can occur. Possible problems include: Damage to nerves or blood vessels. Excess bleeding. Blood clots. Infection. BEFORE THE PROCEDURE Do not eat or drink for 6-8 hours before the procedure. Take medicines as directed by your surgeon. Ask your surgeon about changing or stopping your regular medicines. You may have lab tests the morning of surgery. PROCEDURE  You will be given one of the following:  A medicine that numbs the area (local anesthesia). A medicine that makes you go to sleep (general anesthesia). A medicine injected into your spine that numbs your body below the waist (spinal anesthesia). Most often, several small cuts (incisions) are made in the knee. The arthroscope and instruments go into the incisions to repair the damage. The torn portion of the  meniscus is removed.   AFTER THE PROCEDURE You will be taken to the recovery area where your progress will be monitored. When you are awake, stable, and taking fluids without complications, you will be allowed to go home. This is usually the same day. A torn or stretched ligament (ligament sprain) may take 6-8 weeks to heal.  It takes about the 4-6 WEEKS if your surgeon removed a torn meniscus. A repaired meniscus may require 6-12 weeks of recovery time. A torn ligament needing reconstructive surgery may take 6-12 months to heal fully.   This information is not intended to replace advice given to you by your health care provider. Make sure you discuss any questions you have with your health care provider. You have decided to proceed with operative arthroscopy of the knee. You have decided not to continue with nonoperative measures such as but not limited to oral medication, weight loss, activity modification, physical therapy, bracing, or injection.  We will perform operative arthroscopy of the knee. Some of the risks associated with arthroscopic surgery of the knee include but are not limited to Bleeding Infection Swelling Stiffness Blood clot Pain Need for knee replacement surgery    In compliance with recent [place] law in federal regulation regarding opioid use and abuse and addiction, we will taper (stop) opioid medication after 2 weeks.  If you're not comfortable with these risks and would like to continue with nonoperative treatment please let Dr. Aline Brochure know prior to your surgery.

## 2021-04-15 NOTE — Progress Notes (Signed)
Chief Complaint  Patient presents with   Knee Pain    Left/ review MRI     Encounter Diagnoses  Name Primary?   Chronic pain of left knee Yes   Derangement of posterior horn of medial meniscus of left knee    Lateral meniscus derangement, left    Primary localized osteoarthritis of knee-left    Rhonda Hickman is 65 years old she has multiple medical problems she had an MRI of her left knee it shows that she has torn medial meniscus torn lateral meniscus osteoarthritis with delamination of cartilage in the patellofemoral joint and medial femoral condyle  She is on Xarelto  I do think that her symptoms physical exam and MRI and x-ray findings warrant arthroscopic surgery of the left knee  Her x-ray shows that she actually has well-preserved joint spaces so the cartilage abnormality seen on MRI are surfaced changes without joint space narrowing although she does have osteophyte on x-ray  She will need a cardiology consult a medical consult and management of her Xarelto per medical recommendations prior to surgery  We will do a preop assessment in about 3 to 4 weeks to make sure all of her medical consults have been obtained and then her Xarelto is properly managed for surgery  Our plan is to do an arthroscopy of the left knee with medial lateral meniscectomy  We discussed the fact that it will take 6 weeks to recover she should expect 80% pain relief  I personally reviewed the MRI my interpretation is that the MRI does show the medial and lateral meniscal tears as well as the cartilage loss in the medial and patellofemoral compartment

## 2021-04-15 NOTE — Progress Notes (Signed)
Can we fill this out for her?  She had a stress test/echo that looked good last year and sounds like was reporting no chest pain at recent clinic visit with [facility].  She can hold her Xarelto for 2 days prior to procedure

## 2021-04-16 DIAGNOSIS — E119 Type 2 diabetes mellitus without complications: Secondary | ICD-10-CM | POA: Diagnosis not present

## 2021-04-17 DIAGNOSIS — E119 Type 2 diabetes mellitus without complications: Secondary | ICD-10-CM | POA: Diagnosis not present

## 2021-04-18 DIAGNOSIS — E119 Type 2 diabetes mellitus without complications: Secondary | ICD-10-CM | POA: Diagnosis not present

## 2021-04-19 DIAGNOSIS — E119 Type 2 diabetes mellitus without complications: Secondary | ICD-10-CM | POA: Diagnosis not present

## 2021-04-20 ENCOUNTER — Other Ambulatory Visit (INDEPENDENT_AMBULATORY_CARE_PROVIDER_SITE_OTHER): Payer: Self-pay | Admitting: Primary Care

## 2021-04-20 DIAGNOSIS — N3942 Incontinence without sensory awareness: Secondary | ICD-10-CM

## 2021-04-20 DIAGNOSIS — Z760 Encounter for issue of repeat prescription: Secondary | ICD-10-CM

## 2021-04-20 DIAGNOSIS — E119 Type 2 diabetes mellitus without complications: Secondary | ICD-10-CM | POA: Diagnosis not present

## 2021-04-20 NOTE — Telephone Encounter (Signed)
Sent to PCP ?

## 2021-04-21 ENCOUNTER — Telehealth: Payer: Self-pay | Admitting: Orthopedic Surgery

## 2021-04-21 DIAGNOSIS — E119 Type 2 diabetes mellitus without complications: Secondary | ICD-10-CM | POA: Diagnosis not present

## 2021-04-21 NOTE — Telephone Encounter (Signed)
Patient called and wants to speak with Amy.  She thinks she gave you the wrong doctor about her blood thinner and she wants to know which doctor she gave you.   Please call her back

## 2021-04-21 NOTE — Telephone Encounter (Signed)
I told her Dr Alda Lea had sent the clearance today with note ok to hold Xarelto 2 days before the surgery  But do not have anything from her primary care She states she will call now.

## 2021-04-21 NOTE — Telephone Encounter (Signed)
Call received from patient's primary care office, [place], [place], asking for clearance form or letter. Their ph# is [phone] / fax# [phone]

## 2021-04-22 DIAGNOSIS — E119 Type 2 diabetes mellitus without complications: Secondary | ICD-10-CM | POA: Diagnosis not present

## 2021-04-22 NOTE — Telephone Encounter (Signed)
April 15, 2021   Rhonda Hickman [address] [name] [address]   Dr. Oletta Lamas and Dr Gardiner Rhyme:  We would appreciate your evaluation and opinion on risk assessment. We can NOT perform the knee arthroscopy surgery until this assessment is received by [place]. Thank you for your help and allowing [place] to care for your patient.   Please check all that apply                                                                                                          []  Optimized for surgery from a medical and cardiac standpoint []  Optimized for surgery from a medical standpoint only []  Optimized for surgery from a cardiac standpoint only []  HgB A1C is _______________ (Less than 7.5 is required for surgery) []  BMI is ___________(Less than 40 is required for surgery) []  Anticoagulation / Medication: ______________________ is ok to stop ________ days prior to surgery      If you have any questions or concerns, please don't hesitate to call.  Sincerely,    Arther Abbott, MD

## 2021-04-22 NOTE — Telephone Encounter (Signed)
I have sent letter twice, in epic, I will fax to her this afternoon

## 2021-04-23 DIAGNOSIS — E119 Type 2 diabetes mellitus without complications: Secondary | ICD-10-CM | POA: Diagnosis not present

## 2021-04-24 DIAGNOSIS — E119 Type 2 diabetes mellitus without complications: Secondary | ICD-10-CM | POA: Diagnosis not present

## 2021-04-25 DIAGNOSIS — E119 Type 2 diabetes mellitus without complications: Secondary | ICD-10-CM | POA: Diagnosis not present

## 2021-04-26 DIAGNOSIS — E119 Type 2 diabetes mellitus without complications: Secondary | ICD-10-CM | POA: Diagnosis not present

## 2021-04-27 DIAGNOSIS — E119 Type 2 diabetes mellitus without complications: Secondary | ICD-10-CM | POA: Diagnosis not present

## 2021-04-28 DIAGNOSIS — E119 Type 2 diabetes mellitus without complications: Secondary | ICD-10-CM | POA: Diagnosis not present

## 2021-04-29 ENCOUNTER — Other Ambulatory Visit: Payer: Medicaid Other | Admitting: Adult Health

## 2021-04-29 DIAGNOSIS — E119 Type 2 diabetes mellitus without complications: Secondary | ICD-10-CM | POA: Diagnosis not present

## 2021-04-30 DIAGNOSIS — E119 Type 2 diabetes mellitus without complications: Secondary | ICD-10-CM | POA: Diagnosis not present

## 2021-05-01 DIAGNOSIS — E119 Type 2 diabetes mellitus without complications: Secondary | ICD-10-CM | POA: Diagnosis not present

## 2021-05-02 DIAGNOSIS — E119 Type 2 diabetes mellitus without complications: Secondary | ICD-10-CM | POA: Diagnosis not present

## 2021-05-03 DIAGNOSIS — E119 Type 2 diabetes mellitus without complications: Secondary | ICD-10-CM | POA: Diagnosis not present

## 2021-05-04 DIAGNOSIS — E119 Type 2 diabetes mellitus without complications: Secondary | ICD-10-CM | POA: Diagnosis not present

## 2021-05-05 DIAGNOSIS — M5416 Radiculopathy, lumbar region: Secondary | ICD-10-CM | POA: Diagnosis not present

## 2021-05-05 DIAGNOSIS — Z79899 Other long term (current) drug therapy: Secondary | ICD-10-CM | POA: Diagnosis not present

## 2021-05-05 DIAGNOSIS — E119 Type 2 diabetes mellitus without complications: Secondary | ICD-10-CM | POA: Diagnosis not present

## 2021-05-05 DIAGNOSIS — G8929 Other chronic pain: Secondary | ICD-10-CM | POA: Diagnosis not present

## 2021-05-05 DIAGNOSIS — Z7689 Persons encountering health services in other specified circumstances: Secondary | ICD-10-CM | POA: Diagnosis not present

## 2021-05-05 DIAGNOSIS — M25562 Pain in left knee: Secondary | ICD-10-CM | POA: Diagnosis not present

## 2021-05-05 DIAGNOSIS — M25561 Pain in right knee: Secondary | ICD-10-CM | POA: Diagnosis not present

## 2021-05-06 DIAGNOSIS — E119 Type 2 diabetes mellitus without complications: Secondary | ICD-10-CM | POA: Diagnosis not present

## 2021-05-07 DIAGNOSIS — E119 Type 2 diabetes mellitus without complications: Secondary | ICD-10-CM | POA: Diagnosis not present

## 2021-05-07 DIAGNOSIS — Z79899 Other long term (current) drug therapy: Secondary | ICD-10-CM | POA: Diagnosis not present

## 2021-05-08 DIAGNOSIS — E119 Type 2 diabetes mellitus without complications: Secondary | ICD-10-CM | POA: Diagnosis not present

## 2021-05-09 DIAGNOSIS — E119 Type 2 diabetes mellitus without complications: Secondary | ICD-10-CM | POA: Diagnosis not present

## 2021-05-10 DIAGNOSIS — E119 Type 2 diabetes mellitus without complications: Secondary | ICD-10-CM | POA: Diagnosis not present

## 2021-05-11 DIAGNOSIS — E119 Type 2 diabetes mellitus without complications: Secondary | ICD-10-CM | POA: Diagnosis not present

## 2021-05-12 ENCOUNTER — Telehealth: Payer: Self-pay | Admitting: Orthopedic Surgery

## 2021-05-12 DIAGNOSIS — E119 Type 2 diabetes mellitus without complications: Secondary | ICD-10-CM | POA: Diagnosis not present

## 2021-05-12 NOTE — Telephone Encounter (Signed)
Patient called to request to speak with Amy regarding scheduling surgery.[phone]

## 2021-05-12 NOTE — Telephone Encounter (Signed)
Pt called in stating the ortho office has still not received the clearance form from PCP yet, and asked if she could get that sent over asap so pt can get her Orthopedic Surgery done, pt requested if someone could give her a call back when its faxed to them, please advise.

## 2021-05-12 NOTE — Telephone Encounter (Signed)
Sent to PCP ?

## 2021-05-12 NOTE — Telephone Encounter (Signed)
I spoke to her told her we have clearance from her cardiologist but not her primary care doctor. She said she would get the clearance sent over to [place].

## 2021-05-13 ENCOUNTER — Telehealth (INDEPENDENT_AMBULATORY_CARE_PROVIDER_SITE_OTHER): Payer: Self-pay | Admitting: Primary Care

## 2021-05-13 ENCOUNTER — Telehealth: Payer: Self-pay | Admitting: Orthopedic Surgery

## 2021-05-13 DIAGNOSIS — E119 Type 2 diabetes mellitus without complications: Secondary | ICD-10-CM | POA: Diagnosis not present

## 2021-05-13 NOTE — Telephone Encounter (Signed)
Patient called in again stated that Dr still waiting on clearance form so that she can have her surgery

## 2021-05-13 NOTE — Telephone Encounter (Signed)
Rhonda Hickman   Myliah wants to you to call her back.

## 2021-05-13 NOTE — Telephone Encounter (Signed)
Routed to PCP 

## 2021-05-13 NOTE — Telephone Encounter (Signed)
Called Patient back and advised her that the form will be faxed to orthopedics

## 2021-05-14 ENCOUNTER — Telehealth: Payer: Self-pay | Admitting: Orthopedic Surgery

## 2021-05-14 DIAGNOSIS — E119 Type 2 diabetes mellitus without complications: Secondary | ICD-10-CM | POA: Diagnosis not present

## 2021-05-14 NOTE — Telephone Encounter (Signed)
Formed faxed

## 2021-05-14 NOTE — Telephone Encounter (Signed)
Rhonda Hickman,   Patient called and is requesting you call her back.

## 2021-05-14 NOTE — Telephone Encounter (Signed)
Ok to schedule.

## 2021-05-15 DIAGNOSIS — E119 Type 2 diabetes mellitus without complications: Secondary | ICD-10-CM | POA: Diagnosis not present

## 2021-05-16 DIAGNOSIS — E119 Type 2 diabetes mellitus without complications: Secondary | ICD-10-CM | POA: Diagnosis not present

## 2021-05-17 ENCOUNTER — Telehealth: Payer: Self-pay

## 2021-05-17 ENCOUNTER — Other Ambulatory Visit: Payer: Self-pay | Admitting: Orthopedic Surgery

## 2021-05-17 DIAGNOSIS — E119 Type 2 diabetes mellitus without complications: Secondary | ICD-10-CM | POA: Diagnosis not present

## 2021-05-17 DIAGNOSIS — G8929 Other chronic pain: Secondary | ICD-10-CM

## 2021-05-17 DIAGNOSIS — M25562 Pain in left knee: Secondary | ICD-10-CM

## 2021-05-17 DIAGNOSIS — I10 Essential (primary) hypertension: Secondary | ICD-10-CM

## 2021-05-17 NOTE — Telephone Encounter (Signed)
Patient wants you to give her a call. She knows you are in clinic now and that it could be later today before you could give her a call. [phone]

## 2021-05-17 NOTE — Telephone Encounter (Signed)
Thanks I will call her Anesthesia will need her clearances I gave them to you Can you give them back?

## 2021-05-17 NOTE — Telephone Encounter (Signed)
Rhonda Heinz, MD (Physician)             Can we fill this out for her?  She had a stress test/echo that looked good last year and sounds like was reporting no chest pain at recent clinic visit with [facility].  She can hold her Xarelto for 2 days prior to procedure     Have clearance from Cardiology Cant find the one from PCP it was faxed   Our plan is to do an arthroscopy of the left knee with medial lateral meniscectomy  Called her discussed with Dr Lemmie Evens and sch for Nov 29th told her to expect a call from hospital for preop

## 2021-05-18 DIAGNOSIS — E119 Type 2 diabetes mellitus without complications: Secondary | ICD-10-CM | POA: Diagnosis not present

## 2021-05-19 ENCOUNTER — Other Ambulatory Visit (HOSPITAL_BASED_OUTPATIENT_CLINIC_OR_DEPARTMENT_OTHER): Payer: Self-pay | Admitting: Family

## 2021-05-19 DIAGNOSIS — Z720 Tobacco use: Secondary | ICD-10-CM

## 2021-05-19 DIAGNOSIS — E119 Type 2 diabetes mellitus without complications: Secondary | ICD-10-CM | POA: Diagnosis not present

## 2021-05-20 DIAGNOSIS — E119 Type 2 diabetes mellitus without complications: Secondary | ICD-10-CM | POA: Diagnosis not present

## 2021-05-21 DIAGNOSIS — R5381 Other malaise: Secondary | ICD-10-CM | POA: Diagnosis not present

## 2021-05-21 DIAGNOSIS — R0902 Hypoxemia: Secondary | ICD-10-CM | POA: Diagnosis not present

## 2021-05-21 DIAGNOSIS — W19XXXA Unspecified fall, initial encounter: Secondary | ICD-10-CM | POA: Diagnosis not present

## 2021-05-21 DIAGNOSIS — R52 Pain, unspecified: Secondary | ICD-10-CM | POA: Diagnosis not present

## 2021-05-22 ENCOUNTER — Inpatient Hospital Stay (HOSPITAL_COMMUNITY): Payer: Medicaid Other

## 2021-05-22 ENCOUNTER — Emergency Department (HOSPITAL_COMMUNITY): Payer: Medicaid Other

## 2021-05-22 ENCOUNTER — Other Ambulatory Visit: Payer: Self-pay

## 2021-05-22 ENCOUNTER — Encounter (HOSPITAL_COMMUNITY): Payer: Self-pay | Admitting: Emergency Medicine

## 2021-05-22 ENCOUNTER — Inpatient Hospital Stay (HOSPITAL_COMMUNITY)
Admission: EM | Admit: 2021-05-22 | Discharge: 2021-05-24 | DRG: 309 | Disposition: A | Discharge status: Home Health | Payer: Medicaid Other | Attending: Family Medicine | Admitting: Family Medicine

## 2021-05-22 DIAGNOSIS — Z833 Family history of diabetes mellitus: Secondary | ICD-10-CM | POA: Diagnosis not present

## 2021-05-22 DIAGNOSIS — R269 Unspecified abnormalities of gait and mobility: Secondary | ICD-10-CM

## 2021-05-22 DIAGNOSIS — I4811 Longstanding persistent atrial fibrillation: Secondary | ICD-10-CM

## 2021-05-22 DIAGNOSIS — R42 Dizziness and giddiness: Secondary | ICD-10-CM | POA: Diagnosis not present

## 2021-05-22 DIAGNOSIS — N390 Urinary tract infection, site not specified: Secondary | ICD-10-CM | POA: Diagnosis not present

## 2021-05-22 DIAGNOSIS — Z9049 Acquired absence of other specified parts of digestive tract: Secondary | ICD-10-CM

## 2021-05-22 DIAGNOSIS — D72829 Elevated white blood cell count, unspecified: Secondary | ICD-10-CM | POA: Diagnosis not present

## 2021-05-22 DIAGNOSIS — E66813 Obesity, class 3: Secondary | ICD-10-CM | POA: Diagnosis present

## 2021-05-22 DIAGNOSIS — I482 Chronic atrial fibrillation, unspecified: Secondary | ICD-10-CM | POA: Diagnosis not present

## 2021-05-22 DIAGNOSIS — R1012 Left upper quadrant pain: Secondary | ICD-10-CM | POA: Diagnosis not present

## 2021-05-22 DIAGNOSIS — E119 Type 2 diabetes mellitus without complications: Secondary | ICD-10-CM | POA: Diagnosis not present

## 2021-05-22 DIAGNOSIS — I959 Hypotension, unspecified: Secondary | ICD-10-CM | POA: Diagnosis not present

## 2021-05-22 DIAGNOSIS — Z794 Long term (current) use of insulin: Secondary | ICD-10-CM | POA: Diagnosis not present

## 2021-05-22 DIAGNOSIS — I10 Essential (primary) hypertension: Secondary | ICD-10-CM | POA: Diagnosis present

## 2021-05-22 DIAGNOSIS — D689 Coagulation defect, unspecified: Secondary | ICD-10-CM | POA: Diagnosis not present

## 2021-05-22 DIAGNOSIS — E669 Obesity, unspecified: Secondary | ICD-10-CM | POA: Diagnosis present

## 2021-05-22 DIAGNOSIS — F1410 Cocaine abuse, uncomplicated: Secondary | ICD-10-CM | POA: Diagnosis present

## 2021-05-22 DIAGNOSIS — W19XXXA Unspecified fall, initial encounter: Secondary | ICD-10-CM | POA: Diagnosis not present

## 2021-05-22 DIAGNOSIS — R0789 Other chest pain: Secondary | ICD-10-CM

## 2021-05-22 DIAGNOSIS — J449 Chronic obstructive pulmonary disease, unspecified: Secondary | ICD-10-CM | POA: Diagnosis present

## 2021-05-22 DIAGNOSIS — M170 Bilateral primary osteoarthritis of knee: Secondary | ICD-10-CM | POA: Diagnosis present

## 2021-05-22 DIAGNOSIS — Z20822 Contact with and (suspected) exposure to covid-19: Secondary | ICD-10-CM | POA: Diagnosis present

## 2021-05-22 DIAGNOSIS — E785 Hyperlipidemia, unspecified: Secondary | ICD-10-CM | POA: Diagnosis not present

## 2021-05-22 DIAGNOSIS — Z8673 Personal history of transient ischemic attack (TIA), and cerebral infarction without residual deficits: Secondary | ICD-10-CM | POA: Diagnosis not present

## 2021-05-22 DIAGNOSIS — Z886 Allergy status to analgesic agent status: Secondary | ICD-10-CM

## 2021-05-22 DIAGNOSIS — I4891 Unspecified atrial fibrillation: Secondary | ICD-10-CM | POA: Diagnosis present

## 2021-05-22 DIAGNOSIS — Z7951 Long term (current) use of inhaled steroids: Secondary | ICD-10-CM | POA: Diagnosis not present

## 2021-05-22 DIAGNOSIS — F17210 Nicotine dependence, cigarettes, uncomplicated: Secondary | ICD-10-CM | POA: Diagnosis present

## 2021-05-22 DIAGNOSIS — R000 Tachycardia, unspecified: Secondary | ICD-10-CM | POA: Diagnosis not present

## 2021-05-22 DIAGNOSIS — W1830XA Fall on same level, unspecified, initial encounter: Secondary | ICD-10-CM | POA: Diagnosis present

## 2021-05-22 DIAGNOSIS — S0990XA Unspecified injury of head, initial encounter: Secondary | ICD-10-CM | POA: Diagnosis not present

## 2021-05-22 DIAGNOSIS — M23300 Other meniscus derangements, unspecified lateral meniscus, right knee: Secondary | ICD-10-CM | POA: Diagnosis present

## 2021-05-22 DIAGNOSIS — F319 Bipolar disorder, unspecified: Secondary | ICD-10-CM | POA: Diagnosis not present

## 2021-05-22 DIAGNOSIS — G629 Polyneuropathy, unspecified: Secondary | ICD-10-CM | POA: Diagnosis present

## 2021-05-22 DIAGNOSIS — R079 Chest pain, unspecified: Secondary | ICD-10-CM | POA: Diagnosis not present

## 2021-05-22 DIAGNOSIS — Z7901 Long term (current) use of anticoagulants: Secondary | ICD-10-CM

## 2021-05-22 DIAGNOSIS — N179 Acute kidney failure, unspecified: Secondary | ICD-10-CM | POA: Diagnosis present

## 2021-05-22 DIAGNOSIS — F419 Anxiety disorder, unspecified: Secondary | ICD-10-CM | POA: Diagnosis present

## 2021-05-22 DIAGNOSIS — M47817 Spondylosis without myelopathy or radiculopathy, lumbosacral region: Secondary | ICD-10-CM | POA: Diagnosis not present

## 2021-05-22 DIAGNOSIS — Z8249 Family history of ischemic heart disease and other diseases of the circulatory system: Secondary | ICD-10-CM | POA: Diagnosis not present

## 2021-05-22 DIAGNOSIS — Z6838 Body mass index (BMI) 38.0-38.9, adult: Secondary | ICD-10-CM

## 2021-05-22 DIAGNOSIS — Z043 Encounter for examination and observation following other accident: Secondary | ICD-10-CM | POA: Diagnosis not present

## 2021-05-22 DIAGNOSIS — K219 Gastro-esophageal reflux disease without esophagitis: Secondary | ICD-10-CM | POA: Diagnosis present

## 2021-05-22 DIAGNOSIS — M25462 Effusion, left knee: Secondary | ICD-10-CM | POA: Diagnosis not present

## 2021-05-22 DIAGNOSIS — Y92009 Unspecified place in unspecified non-institutional (private) residence as the place of occurrence of the external cause: Secondary | ICD-10-CM | POA: Diagnosis not present

## 2021-05-22 DIAGNOSIS — Z79899 Other long term (current) drug therapy: Secondary | ICD-10-CM | POA: Diagnosis not present

## 2021-05-22 DIAGNOSIS — S80919A Unspecified superficial injury of unspecified knee, initial encounter: Secondary | ICD-10-CM | POA: Diagnosis not present

## 2021-05-22 DIAGNOSIS — R1111 Vomiting without nausea: Secondary | ICD-10-CM | POA: Diagnosis not present

## 2021-05-22 DIAGNOSIS — M1711 Unilateral primary osteoarthritis, right knee: Secondary | ICD-10-CM | POA: Diagnosis present

## 2021-05-22 DIAGNOSIS — E1100 Type 2 diabetes mellitus with hyperosmolarity without nonketotic hyperglycemic-hyperosmolar coma (NKHHC): Secondary | ICD-10-CM

## 2021-05-22 DIAGNOSIS — R296 Repeated falls: Secondary | ICD-10-CM | POA: Diagnosis not present

## 2021-05-22 DIAGNOSIS — R0902 Hypoxemia: Secondary | ICD-10-CM | POA: Diagnosis not present

## 2021-05-22 DIAGNOSIS — Z7984 Long term (current) use of oral hypoglycemic drugs: Secondary | ICD-10-CM

## 2021-05-22 DIAGNOSIS — I6529 Occlusion and stenosis of unspecified carotid artery: Secondary | ICD-10-CM | POA: Diagnosis not present

## 2021-05-22 LAB — URINALYSIS, ROUTINE W REFLEX MICROSCOPIC
Bilirubin Urine: NEGATIVE
Glucose, UA: NEGATIVE mg/dL
Ketones, ur: 5 mg/dL — AB
Nitrite: POSITIVE — AB
Protein, ur: NEGATIVE mg/dL
Specific Gravity, Urine: 1.01 (ref 1.005–1.030)
WBC, UA: >50 WBC/hpf — ABNORMAL HIGH (ref 0–5)
pH: 6 (ref 5.0–8.0)

## 2021-05-22 LAB — CBC WITH DIFFERENTIAL/PLATELET
Abs Immature Granulocytes: 0.04 10*3/uL (ref 0.00–0.07)
Basophils Absolute: 0.1 10*3/uL (ref 0.0–0.1)
Basophils Relative: 0 %
Eosinophils Absolute: 0 10*3/uL (ref 0.0–0.5)
Eosinophils Relative: 0 %
HCT: 37.8 % (ref 36.0–46.0)
Hemoglobin: 13.1 g/dL (ref 12.0–15.0)
Immature Granulocytes: 0 %
Lymphocytes Relative: 19 %
Lymphs Abs: 2.4 10*3/uL (ref 0.7–4.0)
MCH: 30.3 pg (ref 26.0–34.0)
MCHC: 34.7 g/dL (ref 30.0–36.0)
MCV: 87.3 fL (ref 80.0–100.0)
Monocytes Absolute: 1.5 10*3/uL — ABNORMAL HIGH (ref 0.1–1.0)
Monocytes Relative: 11 %
Neutro Abs: 9.2 10*3/uL — ABNORMAL HIGH (ref 1.7–7.7)
Neutrophils Relative %: 70 %
Platelets: 209 10*3/uL (ref 150–400)
RBC: 4.33 MIL/uL (ref 3.87–5.11)
RDW: 14.2 % (ref 11.5–15.5)
WBC: 13.2 10*3/uL — ABNORMAL HIGH (ref 4.0–10.5)
nRBC: 0 % (ref 0.0–0.2)

## 2021-05-22 LAB — RAPID URINE DRUG SCREEN, HOSP PERFORMED
Amphetamines: NOT DETECTED
Barbiturates: NOT DETECTED
Benzodiazepines: NOT DETECTED
Cocaine: NOT DETECTED
Opiates: POSITIVE — AB
Tetrahydrocannabinol: NOT DETECTED

## 2021-05-22 LAB — CBG MONITORING, ED: Glucose-Capillary: 166 mg/dL — ABNORMAL HIGH (ref 70–99)

## 2021-05-22 LAB — MRSA NEXT GEN BY PCR, NASAL: MRSA by PCR Next Gen: NOT DETECTED

## 2021-05-22 LAB — BASIC METABOLIC PANEL
Anion gap: 10 (ref 5–15)
BUN: 15 mg/dL (ref 8–23)
CO2: 25 mmol/L (ref 22–32)
Calcium: 9.2 mg/dL (ref 8.9–10.3)
Chloride: 100 mmol/L (ref 98–111)
Creatinine, Ser: 1.48 mg/dL — ABNORMAL HIGH (ref 0.44–1.00)
GFR, Estimated: 39 mL/min — ABNORMAL LOW (ref >60)
Glucose, Bld: 205 mg/dL — ABNORMAL HIGH (ref 70–99)
Potassium: 4 mmol/L (ref 3.5–5.1)
Sodium: 135 mmol/L (ref 135–145)

## 2021-05-22 LAB — SEDIMENTATION RATE: Sed Rate: 49 mm/hr — ABNORMAL HIGH (ref 0–22)

## 2021-05-22 LAB — MAGNESIUM: Magnesium: 1.9 mg/dL (ref 1.7–2.4)

## 2021-05-22 LAB — TSH: TSH: 1.28 u[IU]/mL (ref 0.350–4.500)

## 2021-05-22 LAB — BRAIN NATRIURETIC PEPTIDE: B Natriuretic Peptide: 184 pg/mL — ABNORMAL HIGH (ref 0.0–100.0)

## 2021-05-22 LAB — GLUCOSE, CAPILLARY: Glucose-Capillary: 174 mg/dL — ABNORMAL HIGH (ref 70–99)

## 2021-05-22 LAB — RESP PANEL BY RT-PCR (FLU A&B, COVID) ARPGX2
Influenza A by PCR: NEGATIVE
Influenza B by PCR: NEGATIVE
SARS Coronavirus 2 by RT PCR: NEGATIVE

## 2021-05-22 LAB — C-REACTIVE PROTEIN: CRP: 15.1 mg/dL — ABNORMAL HIGH (ref <1.0)

## 2021-05-22 LAB — HEMOGLOBIN A1C
Hgb A1c MFr Bld: 8.9 % — ABNORMAL HIGH (ref 4.8–5.6)
Mean Plasma Glucose: 208.73 mg/dL

## 2021-05-22 MED ORDER — ONDANSETRON HCL 4 MG PO TABS: 4.0000 mg | ORAL_TABLET | Freq: Four times a day (QID) | ORAL | Status: DC | PRN

## 2021-05-22 MED ORDER — METOCLOPRAMIDE HCL 5 MG/ML IJ SOLN
10.0000 mg | Freq: Three times a day (TID) | INTRAMUSCULAR | Status: DC | PRN
  Administered 2021-05-22: 10 mg via INTRAVENOUS
  Filled 2021-05-22: qty 2

## 2021-05-22 MED ORDER — METOPROLOL TARTRATE 25 MG PO TABS: 25.0000 mg | ORAL_TABLET | Freq: Once | ORAL | Status: DC

## 2021-05-22 MED ORDER — LEVALBUTEROL HCL 0.63 MG/3ML IN NEBU: 0.6300 mg | INHALATION_SOLUTION | Freq: Four times a day (QID) | RESPIRATORY_TRACT | Status: DC | PRN

## 2021-05-22 MED ORDER — IPRATROPIUM BROMIDE 0.02 % IN SOLN: 0.5000 mg | Freq: Four times a day (QID) | RESPIRATORY_TRACT | Status: DC | PRN

## 2021-05-22 MED ORDER — HYDROMORPHONE HCL 1 MG/ML IJ SOLN
0.5000 mg | INTRAMUSCULAR | Status: DC | PRN
  Administered 2021-05-23: 0.5 mg via INTRAVENOUS
  Filled 2021-05-22: qty 1

## 2021-05-22 MED ORDER — CITALOPRAM HYDROBROMIDE 20 MG PO TABS
40.0000 mg | ORAL_TABLET | Freq: Every day | ORAL | Status: DC
  Administered 2021-05-22 – 2021-05-24 (×3): 40 mg via ORAL
  Filled 2021-05-22 (×3): qty 2

## 2021-05-22 MED ORDER — SODIUM CHLORIDE 0.9% FLUSH
3.0000 mL | Freq: Two times a day (BID) | INTRAVENOUS | Status: DC
  Administered 2021-05-23 – 2021-05-24 (×2): 3 mL via INTRAVENOUS

## 2021-05-22 MED ORDER — INSULIN GLARGINE 100 UNIT/ML ~~LOC~~ SOLN
20.0000 [IU] | Freq: Two times a day (BID) | SUBCUTANEOUS | Status: DC
  Administered 2021-05-22: 20 [IU] via SUBCUTANEOUS
  Filled 2021-05-22 (×4): qty 0.2

## 2021-05-22 MED ORDER — SODIUM CHLORIDE 0.9 % IV SOLN: 250.0000 mL | INTRAVENOUS | Status: DC | PRN

## 2021-05-22 MED ORDER — ONDANSETRON HCL 4 MG/2ML IJ SOLN
4.0000 mg | Freq: Four times a day (QID) | INTRAMUSCULAR | Status: DC | PRN
  Administered 2021-05-22 – 2021-05-23 (×2): 4 mg via INTRAVENOUS
  Filled 2021-05-22 (×2): qty 2

## 2021-05-22 MED ORDER — SODIUM CHLORIDE 0.9 % IV SOLN: INTRAVENOUS | Status: AC

## 2021-05-22 MED ORDER — METOPROLOL TARTRATE 25 MG PO TABS
25.0000 mg | ORAL_TABLET | Freq: Once | ORAL | Status: AC
  Administered 2021-05-22: 25 mg via ORAL
  Filled 2021-05-22: qty 1

## 2021-05-22 MED ORDER — FLUTICASONE FUROATE-VILANTEROL 200-25 MCG/ACT IN AEPB
1.0000 | INHALATION_SPRAY | Freq: Every day | RESPIRATORY_TRACT | Status: DC
  Administered 2021-05-22 – 2021-05-24 (×3): 1 via RESPIRATORY_TRACT
  Filled 2021-05-22: qty 28

## 2021-05-22 MED ORDER — OXYCODONE HCL 5 MG PO TABS
5.0000 mg | ORAL_TABLET | ORAL | Status: DC | PRN
  Administered 2021-05-22: 5 mg via ORAL
  Filled 2021-05-22: qty 1

## 2021-05-22 MED ORDER — ROSUVASTATIN CALCIUM 10 MG PO TABS
10.0000 mg | ORAL_TABLET | Freq: Every day | ORAL | Status: DC
  Administered 2021-05-22 – 2021-05-24 (×3): 10 mg via ORAL
  Filled 2021-05-22 (×3): qty 1

## 2021-05-22 MED ORDER — SODIUM CHLORIDE 0.9 % IV SOLN
1.0000 g | INTRAVENOUS | Status: DC
  Administered 2021-05-22 – 2021-05-24 (×2): 1 g via INTRAVENOUS
  Filled 2021-05-22 (×2): qty 10

## 2021-05-22 MED ORDER — BISACODYL 5 MG PO TBEC: 5.0000 mg | DELAYED_RELEASE_TABLET | Freq: Every day | ORAL | Status: DC | PRN

## 2021-05-22 MED ORDER — GABAPENTIN 300 MG PO CAPS: 900.0000 mg | ORAL_CAPSULE | Freq: Three times a day (TID) | ORAL | Status: DC

## 2021-05-22 MED ORDER — SODIUM CHLORIDE 0.9 % IV SOLN: Freq: Once | INTRAVENOUS | Status: AC

## 2021-05-22 MED ORDER — HYDRALAZINE HCL 20 MG/ML IJ SOLN: 10.0000 mg | INTRAMUSCULAR | Status: DC | PRN

## 2021-05-22 MED ORDER — OXYBUTYNIN CHLORIDE 5 MG PO TABS: 5.0000 mg | ORAL_TABLET | Freq: Three times a day (TID) | ORAL | Status: DC | PRN

## 2021-05-22 MED ORDER — GABAPENTIN 300 MG PO CAPS
300.0000 mg | ORAL_CAPSULE | Freq: Three times a day (TID) | ORAL | Status: DC
  Administered 2021-05-22 – 2021-05-24 (×5): 300 mg via ORAL
  Filled 2021-05-22 (×6): qty 1

## 2021-05-22 MED ORDER — METOPROLOL TARTRATE 25 MG PO TABS
25.0000 mg | ORAL_TABLET | Freq: Two times a day (BID) | ORAL | Status: DC
  Filled 2021-05-22: qty 1

## 2021-05-22 MED ORDER — SODIUM CHLORIDE 0.9% FLUSH
3.0000 mL | Freq: Two times a day (BID) | INTRAVENOUS | Status: DC
  Administered 2021-05-23 – 2021-05-24 (×3): 3 mL via INTRAVENOUS

## 2021-05-22 MED ORDER — ACETAMINOPHEN 650 MG RE SUPP: 650.0000 mg | Freq: Four times a day (QID) | RECTAL | Status: DC | PRN

## 2021-05-22 MED ORDER — TRAZODONE HCL 50 MG PO TABS: 25.0000 mg | ORAL_TABLET | Freq: Every evening | ORAL | Status: DC | PRN

## 2021-05-22 MED ORDER — FERROUS SULFATE 325 (65 FE) MG PO TABS
325.0000 mg | ORAL_TABLET | Freq: Every day | ORAL | Status: DC
  Administered 2021-05-23 – 2021-05-24 (×2): 325 mg via ORAL
  Filled 2021-05-22 (×2): qty 1

## 2021-05-22 MED ORDER — METFORMIN HCL 500 MG PO TABS
1000.0000 mg | ORAL_TABLET | Freq: Two times a day (BID) | ORAL | Status: DC
  Administered 2021-05-22 – 2021-05-24 (×4): 1000 mg via ORAL
  Filled 2021-05-22 (×4): qty 2

## 2021-05-22 MED ORDER — SENNOSIDES-DOCUSATE SODIUM 8.6-50 MG PO TABS: 1.0000 | ORAL_TABLET | Freq: Every evening | ORAL | Status: DC | PRN

## 2021-05-22 MED ORDER — INSULIN ASPART 100 UNIT/ML IJ SOLN
0.0000 [IU] | Freq: Three times a day (TID) | INTRAMUSCULAR | Status: DC
  Administered 2021-05-22 – 2021-05-23 (×2): 2 [IU] via SUBCUTANEOUS
  Administered 2021-05-23: 5 [IU] via SUBCUTANEOUS
  Administered 2021-05-23: 2 [IU] via SUBCUTANEOUS
  Administered 2021-05-24: 3 [IU] via SUBCUTANEOUS
  Filled 2021-05-22: qty 1

## 2021-05-22 MED ORDER — OYSTER SHELL CALCIUM/D3 500-5 MG-MCG PO TABS
1.0000 | ORAL_TABLET | Freq: Every day | ORAL | Status: DC
  Administered 2021-05-23 – 2021-05-24 (×2): 1 via ORAL
  Filled 2021-05-22 (×2): qty 1

## 2021-05-22 MED ORDER — ACETAMINOPHEN 325 MG PO TABS
650.0000 mg | ORAL_TABLET | Freq: Once | ORAL | Status: AC
  Administered 2021-05-22: 650 mg via ORAL
  Filled 2021-05-22: qty 2

## 2021-05-22 MED ORDER — HEPARIN SODIUM (PORCINE) 5000 UNIT/ML IJ SOLN: 5000.0000 [IU] | Freq: Three times a day (TID) | INTRAMUSCULAR | Status: DC

## 2021-05-22 MED ORDER — RIVAROXABAN 20 MG PO TABS
20.0000 mg | ORAL_TABLET | Freq: Every day | ORAL | Status: DC
  Administered 2021-05-22 – 2021-05-23 (×2): 20 mg via ORAL
  Filled 2021-05-22 (×2): qty 1

## 2021-05-22 MED ORDER — HYDROCODONE-ACETAMINOPHEN 10-325 MG PO TABS: 1.0000 | ORAL_TABLET | Freq: Four times a day (QID) | ORAL | Status: DC | PRN

## 2021-05-22 MED ORDER — DILTIAZEM HCL-DEXTROSE 125-5 MG/125ML-% IV SOLN (PREMIX)
5.0000 mg/h | INTRAVENOUS | Status: DC
  Administered 2021-05-22: 5 mg/h via INTRAVENOUS
  Filled 2021-05-22: qty 125

## 2021-05-22 MED ORDER — SODIUM CHLORIDE 0.9% FLUSH
3.0000 mL | INTRAVENOUS | Status: DC | PRN
  Administered 2021-05-23: 3 mL via INTRAVENOUS

## 2021-05-22 MED ORDER — ACETAMINOPHEN 325 MG PO TABS
650.0000 mg | ORAL_TABLET | Freq: Four times a day (QID) | ORAL | Status: DC | PRN
  Administered 2021-05-23 (×2): 650 mg via ORAL
  Filled 2021-05-22 (×2): qty 2

## 2021-05-22 NOTE — ED Triage Notes (Signed)
Pt arrived via [facility] for frequent falls. Per [facility] pt was tachycardic in route.

## 2021-05-22 NOTE — H&P (Signed)
History and Physical   Patient: Rhonda Hickman                            PCP: Rhonda Perna, NP                    DOB: 14-Nov-1955            DOA: 05/22/2021 ALP:[id number]             DOS: 05/22/2021, 2:56 PM  Rhonda Perna, NP  Patient coming from:   HOME  I have personally reviewed patient's medical records, in electronic medical records, including:  [place] link, and care everywhere.    Chief Complaint:   Chief Complaint  Patient presents with   Fall    History of present illness:    [name] is a 65 y.o. female with medical history significant of chronic iron deficiency anemia, bipolar 1, anxiety, asthma, chronic atrial fibrillation on Xarelto, chronic left and right knee pain, COPD, DM2, HTN, HLD, neuropathy ?  CVA versus TIA... Presents status post fall due to abnormal gait due to knee pain. Initial work-up in ED was negative, but patient was found in A. fib with RVR, IV metoprolol was given, patient heart rate was still elevated.   Patient Denies having: Fever, Chills, Cough, SOB, Chest Pain, Abd pain, N/V/D, headache, dizziness, lightheadedness,  Dysuria, Joint pain, rash, open wounds  ED Course:   Vitals:   05/22/21 1400 05/22/21 1430  BP: 114/84 135/87  Pulse: (!) 130 (!) 138  Resp: 19 19  Temp:    SpO2: 100% 100%   Abnormal labs; WBC 3.2, creatinine 1.48, glucose 205  1 L IV fluid normal saline, metoprolol, was given in ED, patient persistently was in A. fib requested for admission for better rate control -Patient was started on Cardizem drip  Review of Systems: As per HPI, otherwise 10 point review of systems were negative.   ----------------------------------------------------------------------------------------------------------------------  Allergies  Allergen Reactions   Aspirin Nausea Only and Other (See Comments)    Causes stomach pain    Home MEDs:  Prior to Admission medications   Medication Sig Start Date End Date  Taking? Authorizing Provider  albuterol (VENTOLIN HFA) 108 (90 Base) MCG/ACT inhaler INHALE 2 PUFFS EVERY 6 HOURS AS NEEDED FOR WHEEZING OR SHORTNESS OF BREATH 03/10/21   Rhonda Perna, NP  amLODipine (NORVASC) 10 MG tablet Take 1 tablet (10 mg total) by mouth daily. 03/10/21   Rhonda Perna, NP  budesonide-formoterol [facility]) 160-4.5 MCG/ACT inhaler Inhale 2 puffs into the lungs 2 (two) times daily. 03/10/21   Rhonda Perna, NP  buPROPion (WELLBUTRIN SR) 150 MG 12 hr tablet Take one tablet daily for 3 days. Then, take one tablet twice daily for 3 months. 03/08/21   Loel Dubonnet, NP  calcium-vitamin D (OSCAL WITH D) 500-200 MG-UNIT tablet TAKE (1) TABLET DAILY WITH BREAKFAST. 12/09/20   Rhonda Perna, NP  citalopram (CELEXA) 40 MG tablet Take 1 tablet (40 mg total) by mouth daily. 03/10/21   Rhonda Perna, NP  FEROSUL 325 (65 Fe) MG tablet TAKE (1) TABLET DAILY WITH BREAKFAST. 03/29/21   Rhonda Perna, NP  fluticasone (FLONASE) 50 MCG/ACT nasal spray Place 2 sprays into both nostrils daily. 06/08/20   Rhonda Perna, NP  gabapentin (NEURONTIN) 300 MG capsule Take 3 capsules (900 mg total) by mouth 3 (three) times daily. 01/16/21   Jannifer Franklin,  Elon Alas, MD  GNP ULTICARE PEN NEEDLES 32G X 4 MM MISC  04/17/20   [provider]  HYDROcodone-acetaminophen (NORCO) 10-325 MG tablet Take 1 tablet by mouth every 6 (six) hours as needed.    [provider]  insulin glargine (LANTUS SOLOSTAR) 100 UNIT/ML Solostar Pen Inject 20 Units into the skin 2 (two) times daily. 03/10/21   Rhonda Perna, NP  Insulin Syringe-Needle U-100 (ADVOCATE INSULIN SYRINGE) 29G X 1/2" 0.3 ML MISC Use to inject insulin 5x daily. 09/07/20   Rhonda Perna, NP  JANUVIA 100 MG tablet TAKE 1 TABLET DAILY 02/24/21   Fenton Foy, NP  meclizine (ANTIVERT) 12.5 MG tablet TAKE 1 TABLET 3 TIMES A DAY AS NEEDED FOR DIZZINESS 07/22/20   [name], MD  metFORMIN  (GLUCOPHAGE) 1000 MG tablet Take 1 tablet (1,000 mg total) by mouth 2 (two) times daily with a meal. 03/10/21   Rhonda Perna, NP  metoprolol tartrate (LOPRESSOR) 25 MG tablet Take 1 tablet (25 mg total) by mouth 2 (two) times daily. 01/28/21 04/28/21  [name], MD  oxybutynin (DITROPAN) 5 MG tablet TAKE 1 TABLET 2 TO 3 TIMES A DAY FOR BLADDER SPASMS 04/20/21   Rhonda Perna, NP  PEG-KCl-NaCl-NaSulf-Na Asc-C (PLENVU) 140 g SOLR Take 140 g by mouth as directed. Manufacturer's coupon Universal coupon code:BIN: P2366821; GROUP: [id number]; PCN: [id number]; ID: [id number]; PAY NO MORE $50 03/03/20   Thornton Park, MD  rosuvastatin (CRESTOR) 10 MG tablet Take 1 tablet (10 mg total) by mouth daily. 12/14/20 03/14/21  [name], MD  Vitamin D, Ergocalciferol, (DRISDOL) 1.25 MG (50000 UNIT) CAPS capsule Take 1 capsule (50,000 Units total) by mouth every 7 (seven) days. 11/28/19   Rhonda Perna, NP  XARELTO 20 MG TABS tablet TAKE ONE TABLET BY MOUTH WITH SUPPER 04/07/21   Fenton Foy, NP    PRN MEDs: sodium chloride, acetaminophen **OR** acetaminophen, bisacodyl, hydrALAZINE, HYDROcodone-acetaminophen, HYDROmorphone (DILAUDID) injection, ipratropium, levalbuterol, ondansetron **OR** ondansetron (ZOFRAN) IV, oxybutynin, oxyCODONE, senna-docusate, sodium chloride flush, traZODone  Past Medical History:  Diagnosis Date   Anemia    Anxiety    Asthma    Atrial fibrillation (HCC)    Bipolar 1 disorder (HCC)    Bulging lumbar disc    Chronic pain of left knee    COPD (chronic obstructive pulmonary disease) ([place])    CVA (cerebral vascular accident) ([place])    Diabetes mellitus    Neuropathy    TIA (transient ischemic attack)    Vertigo     Past Surgical History:  Procedure Laterality Date   ABDOMINAL HYSTERECTOMY     CARPAL TUNNEL RELEASE Right    CHOLECYSTECTOMY     KNEE ARTHROSCOPY WITH MEDIAL MENISECTOMY Right 04/14/2016   Procedure: KNEE ARTHROSCOPY WITH  MEDIAL AND LATERAL MENISECTOMY, MICROFRACTURE REPAIR;  Surgeon: Carole Civil, MD;  Location: [facility];  Service: Orthopedics;  Laterality: Right;  lateral menisectomy - needs crutch training     reports that she has been smoking cigarettes. She has been smoking an average of .5 packs per day. She has never used smokeless tobacco. She reports that she does not drink alcohol and does not use drugs.   Family History  Problem Relation Age of Onset   Diabetes Mother    Heart attack Father     Physical Exam:   Vitals:   05/22/21 1230 05/22/21 1300 05/22/21 1400 05/22/21 1430  BP: 111/73 125/75 114/84 135/87  Pulse: (!) 107 (!)  138 (!) 130 (!) 138  Resp: (!) 25 17 19 19   Temp:      TempSrc:      SpO2: 100% 100% 100% 100%  Weight:      Height:       Constitutional: NAD, calm, comfortable Eyes: PERRL, lids and conjunctivae normal ENMT: Mucous membranes are moist. Posterior pharynx clear of any exudate or lesions.Normal dentition.  Neck: normal, supple, no masses, no thyromegaly Respiratory: clear to auscultation bilaterally, no wheezing, no crackles. Normal respiratory effort. No accessory muscle use.  Cardiovascular: Regular rate and rhythm, no murmurs / rubs / gallops. No extremity edema. 2+ pedal pulses. No carotid bruits.  Abdomen: no tenderness, no masses palpated. No hepatosplenomegaly. Bowel sounds positive.  Musculoskeletal: no clubbing / cyanosis. No joint deformity upper and lower extremities. Good ROM, no contractures. Normal muscle tone.  Neurologic: CN II-XII grossly intact. Sensation intact, DTR normal. Strength 5/5 in all 4.  Psychiatric: Normal judgment and insight. Alert and oriented x 3. Normal mood.  Skin: no rashes, lesions, ulcers. No induration Decubitus/ulcers:  Wounds: per nursing documentation     Labs on admission:    I have personally reviewed following labs and imaging studies  CBC: Recent Labs  Lab 05/22/21 1218  WBC 13.2*  NEUTROABS 9.2*   HGB 13.1  HCT 37.8  MCV 87.3  PLT 161   Basic Metabolic Panel: Recent Labs  Lab 05/22/21 1218  NA 135  K 4.0  CL 100  CO2 25  GLUCOSE 205*  BUN 15  CREATININE 1.48*  CALCIUM 9.2    Radiologic Exams on Admission:   CT Head Wo Contrast  Result Date: 05/22/2021 CLINICAL DATA:  Head trauma, coagulopathy (Age 65-64y) EXAM: CT HEAD WITHOUT CONTRAST TECHNIQUE: Contiguous axial images were obtained from the base of the skull through the vertex without intravenous contrast. COMPARISON:  March 17, 2020. FINDINGS: Evaluation of particularly the skull base is limited secondary to patient motion. Brain: No evidence of acute infarction, hemorrhage, hydrocephalus, extra-axial collection or mass lesion/mass effect. Periventricular white matter hypodensities consistent with sequela of chronic microvascular ischemic disease. Vascular: Vascular calcifications of the carotid siphons. Skull: No acute fracture within the limitations of the exam. Sinuses/Orbits: No acute finding. Other: None. IMPRESSION: No acute intracranial abnormality within the limitations of the exam. Electronically Signed   By: Valentino Saxon M.D.   On: 05/22/2021 13:51   DG Knee Complete 4 Views Left  Result Date: 05/22/2021 CLINICAL DATA:  Fall. EXAM: LEFT KNEE - COMPLETE 4+ VIEW COMPARISON:  MRI left knee dated April 06, 2021. Left knee x-rays dated February 06, 2021. FINDINGS: No acute fracture or dislocation. Trace joint effusion. Unchanged heterotopic ossification along the medial femoral condyle related to old MCL injury. 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 Joint spaces are preserved. Tricompartmental marginal osteophytes. Soft tissues are unremarkable. IMPRESSION: 1. No acute osseous abnormality. 2. Mild tricompartmental osteoarthritis. Electronically Signed   By: Titus Dubin M.D.   On: 05/22/2021 13:31    EKG:   Independently reviewed.  Orders placed or performed during the hospital encounter of  05/22/21   EKG 12-Lead   EKG 12-Lead   EKG 12-Lead   ---------------------------------------------------------------------------------------------------------------------------------------    Assessment / Plan:   Principal Problem:   A-fib [facility]) Active Problems:   Chronic atrial fibrillation (HCC)   Type 2 diabetes mellitus (HCC)   Cocaine abuse (HCC)   Meniscus, lateral, derangement, right   Arthritis of knee, right   Gait abnormality   Obesity, Class  III, BMI 40-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) ([place])   Gastroesophageal reflux disease   Bipolar 1 disorder (HCC)   Hypertension   HLD (hyperlipidemia)   Fall at home, initial encounter   Leukocytosis   Principal Problem:   A-fib ([place])  with RVR-  -Patient was given 1 dose of IV metoprolol in ED, still in A. fib with RVR -Initiated on Cardizem drip with a goal to get to heart rate less than 100 -We will resume home medication of metoprolol and titrate up for better heart rate control -Continue home medication of Xarelto -TSH : 1.28   Status post fall -Accidental, status post evaluation negative for any trauma  -CT of the head negative -Fall precautions -Increased risk of fall due to right meniscus tear, arthritis, gait abnormality PT/OT  Meniscus, lateral, derangement, right/   Arthritis of Rt and Lt knee/  Gait abnormality -As needed analgesics -Supplement vitamin D -PT/OT consulted for evaluation recommendation -Fall precaution Left knee x-ray: IMPRESSION: 1. No acute osseous abnormality. 2. Mild tricompartmental osteoarthritis.   Active Problems:  Chronic atrial fibrillation (HCC) -on Xarelto and metoprolol    Type 2 diabetes mellitus ([place]) -uncontrolled -Checking hemoglobin A1c:  Last A1c: 10.3 on 03/10/21  -We will monitor CBG q. [place] [facility] with SSI coverage -Reviewed home medication continue Lantus 20 units twice daily, holding Januvia  -Resume Glucophage   Leukocytosis -WBC 13.2, afebrile,  normotensive -Obtaining UA... No signs of respiratory distress or cough, no chest x-ray on file at    [facility] -BUN 15, creatinine 1.48 -Baseline creatinine over past year 0.9-1.78     Obesity, Class III, BMI 40-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) ([place]) Body mass index is 38.79 kg/m. -Consult patient regarding healthy diet, exercise, active weight loss    Gastroesophageal reflux disease -Continue PPI  Bipolar 1 disorder (HCC) -Currently stable, home medication reviewed Celexa and Wellbutrin - while holding Wellbutrin due to increased risk of falls    Hypertension -Stable home medication of metoprolol, Norvasc   -Holding Norvasc as patient will be on Cardizem drip (to avoid any hypotension)   HLD (hyperlipidemia) -Continue statin, on Crestor   H/o  Cocaine abuse ([place]) -per pt 20 yrs ago -Checking urine drug screen Cultures:  -Urine >> Antimicrobial: -IV Rocephin  Consults called:  None -------------------------------------------------------------------------------------------------------------------------------------------- DVT prophylaxis: SCD/Compression stockings and QA:[id number]  Code Status:   Code Status: Full Code   Admission status: Patient will be admitted as Inpatient, with a greater than 2 midnight length of stay. Level of care: Stepdown   Family Communication:  none at bedside  (The above findings and plan of care has been discussed with patient in detail, the patient expressed understanding and agreement of above plan)  ----------------------------------------------------------------------------------------------------------------------------------  Disposition Plan:  Anticipated 1-2 days Status is: Inpatient  Remains inpatient appropriate because: Requiring close monitoring on a monitored bed, IV fluids, and IV medication for better heart rate  control    -------------------------------------------------------------------------------------------------------------------------------------  Time spent: > than  19  Min.   SIGNED: Deatra James, MD, FHM. [facility],  Pager (Please use [email or web address] to page to text)  If 7PM-7AM, please contact night-coverage [email or web address],  05/22/2021, 2:57 PM

## 2021-05-22 NOTE — Progress Notes (Signed)
ANTICOAGULATION CONSULT NOTE - Initial Consult  Pharmacy Consult for xarelto Indication: atrial fibrillation  Allergies  Allergen Reactions   Aspirin Nausea Only and Other (See Comments)    Causes stomach pain    Patient Measurements: Height: 6' (182.9 cm) Weight: 129.7 kg (286 lb) IBW/kg (Calculated) : 73.1   Vital Signs: Temp: 98.9 F (37.2 C) (11/12 1204) Temp Source: Oral (11/12 1204) BP: 135/87 (11/12 1430) Pulse Rate: 138 (11/12 1430)  Labs: Recent Labs    05/22/21 1218  HGB 13.1  HCT 37.8  PLT 209  CREATININE 1.48*    Estimated Creatinine Clearance: 58 mL/min (A) (by C-G formula based on SCr of 1.48 mg/dL (H)).   Medical History: Past Medical History:  Diagnosis Date   Anemia    Anxiety    Asthma    Atrial fibrillation (HCC)    Bipolar 1 disorder (HCC)    Bulging lumbar disc    Chronic pain of left knee    COPD (chronic obstructive pulmonary disease) (HCC)    CVA (cerebral vascular accident) ([place])    Diabetes mellitus    Neuropathy    TIA (transient ischemic attack)    Vertigo     Medications:  See med rec  Assessment: Patient with chronic afib and on xarelto 20mg  daily at home.   Goal of Therapy:   Monitor platelets by anticoagulation protocol: Yes   Plan:  Continue Xarelto 20mg  daily in the evening with supper Monitor for s/s of bleeding  Isac Sarna, BS Vena [place], BCPS Clinical Pharmacist Pager [phone] 05/22/2021,3:09 PM

## 2021-05-22 NOTE — Plan of Care (Signed)
  Problem: Education: Goal: Knowledge of General Education information will improve Description: Including pain rating scale, medication(s)/side effects and non-pharmacologic comfort measures Outcome: Progressing   Problem: Clinical Measurements: Goal: Cardiovascular complication will be avoided Outcome: Progressing   Problem: Nutrition: Goal: Adequate nutrition will be maintained Outcome: Not Progressing

## 2021-05-22 NOTE — ED Provider Notes (Signed)
[facility] EMERGENCY DEPARTMENT Provider Note   CSN: [id number] Arrival date & time: 05/22/21  1206     History Chief Complaint  Patient presents with   Lytle Michaels    Rhonda Hickman is a 65 y.o. female.  Complaint of ground-level fall.  She says she has a history of arthritis in her left knee and oftentimes will give out.  She was walking today when her left knee gave out and she fell.  Complaining of headache but no other pain elsewhere other than her left knee and headache.  Denies loss of consciousness.  Denies recent illnesses with fever vomiting cough or diarrhea.  Denies chest pain or abdominal pain or back pain.      Past Medical History:  Diagnosis Date   Anemia    Anxiety    Asthma    Atrial fibrillation ([place])    Bipolar 1 disorder ([place])    Bulging lumbar disc    Chronic pain of left knee    COPD (chronic obstructive pulmonary disease) ([place])    CVA (cerebral vascular accident) ([place])    Diabetes mellitus    Neuropathy    TIA (transient ischemic attack)    Vertigo     Patient Active Problem List   Diagnosis Date Noted   A-fib ([place]) 05/22/2021   Dizziness 06/02/2020   Hypertension 03/18/2020   AMS (altered mental status) 03/17/2020   Normocytic anemia 03/17/2020   Bipolar 1 disorder ([place])    AKI (acute kidney injury) ([place]) 12/18/2019   Hyperkalemia 12/17/2019   Chronic atrial fibrillation ([place]) 12/03/2019   TIA (transient ischemic attack) 12/03/2019   Diabetic peripheral neuropathy ([place]) 12/03/2019   Chronic migraine 12/03/2019   Vertigo 08/03/2019   Slurred speech 08/03/2019   Leukocytosis 08/03/2019   Bronchiectasis with acute exacerbation (HCC)    Obesity, Class III, BMI 40-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) ([place])    Gastroesophageal reflux disease    Asthma exacerbation 06/06/2018   Gait abnormality 08/29/2017   Urinary incontinence 08/29/2017   Paresthesia 08/29/2017   Derangement of posterior horn of medial meniscus of right knee    Meniscus, lateral,  derangement, right    Arthritis of knee, right    COPD with acute exacerbation ([place]) 03/09/2016   Type 2 diabetes mellitus ([place]) 02/16/2007   Cocaine abuse ([place]) 02/16/2007   Extrinsic asthma 02/16/2007   HOMELESSNESS, HX OF 02/16/2007    Past Surgical History:  Procedure Laterality Date   ABDOMINAL HYSTERECTOMY     CARPAL TUNNEL RELEASE Right    CHOLECYSTECTOMY     KNEE ARTHROSCOPY WITH MEDIAL MENISECTOMY Right 04/14/2016   Procedure: KNEE ARTHROSCOPY WITH MEDIAL AND LATERAL MENISECTOMY, MICROFRACTURE REPAIR;  Surgeon: Carole Civil, MD;  Location: [facility];  Service: Orthopedics;  Laterality: Right;  lateral menisectomy - needs crutch training     OB History     Gravida      Para      Term      Preterm      AB      Living  0      SAB      IAB      Ectopic      Multiple      Live Births              Family History  Problem Relation Age of Onset   Diabetes Mother    Heart attack Father     Social History   Tobacco Use   Smoking status: Some Days  Packs/day: 0.50    Types: Cigarettes   Smokeless tobacco: Never   Tobacco comments:    tobacco info given  Vaping Use   Vaping Use: Some days  Substance Use Topics   Alcohol use: No   Drug use: No    Home Medications Prior to Admission medications   Medication Sig Start Date End Date Taking? Authorizing Provider  albuterol (VENTOLIN HFA) 108 (90 Base) MCG/ACT inhaler INHALE 2 PUFFS EVERY 6 HOURS AS NEEDED FOR WHEEZING OR SHORTNESS OF BREATH 03/10/21   [name], NP  amLODipine (NORVASC) 10 MG tablet Take 1 tablet (10 mg total) by mouth daily. 03/10/21   [name], NP  budesonide-formoterol [facility]) 160-4.5 MCG/ACT inhaler Inhale 2 puffs into the lungs 2 (two) times daily. 03/10/21   [name], NP  buPROPion (WELLBUTRIN SR) 150 MG 12 hr tablet Take one tablet daily for 3 days. Then, take one tablet twice daily for 3 months. 03/08/21   Loel Dubonnet, NP   calcium-vitamin D (OSCAL WITH D) 500-200 MG-UNIT tablet TAKE (1) TABLET DAILY WITH BREAKFAST. 12/09/20   [name], NP  citalopram (CELEXA) 40 MG tablet Take 1 tablet (40 mg total) by mouth daily. 03/10/21   [name], NP  FEROSUL 325 (65 Fe) MG tablet TAKE (1) TABLET DAILY WITH BREAKFAST. 03/29/21   [name], NP  fluticasone (FLONASE) 50 MCG/ACT nasal spray Place 2 sprays into both nostrils daily. 06/08/20   [name], NP  gabapentin (NEURONTIN) 300 MG capsule Take 3 capsules (900 mg total) by mouth 3 (three) times daily. 01/16/21   Kathrynn Ducking, MD  GNP ULTICARE PEN NEEDLES 32G X 4 MM MISC  04/17/20   [provider]  HYDROcodone-acetaminophen (NORCO) 10-325 MG tablet Take 1 tablet by mouth every 6 (six) hours as needed.    [provider]  insulin glargine (LANTUS SOLOSTAR) 100 UNIT/ML Solostar Pen Inject 20 Units into the skin 2 (two) times daily. 03/10/21   [name], NP  Insulin Syringe-Needle U-100 (ADVOCATE INSULIN SYRINGE) 29G X 1/2" 0.3 ML MISC Use to inject insulin 5x daily. 09/07/20   [name], NP  JANUVIA 100 MG tablet TAKE 1 TABLET DAILY 02/24/21   Fenton Foy, NP  meclizine (ANTIVERT) 12.5 MG tablet TAKE 1 TABLET 3 TIMES A DAY AS NEEDED FOR DIZZINESS 07/22/20   [name], MD  metFORMIN (GLUCOPHAGE) 1000 MG tablet Take 1 tablet (1,000 mg total) by mouth 2 (two) times daily with a meal. 03/10/21   [name], NP  metoprolol tartrate (LOPRESSOR) 25 MG tablet Take 1 tablet (25 mg total) by mouth 2 (two) times daily. 01/28/21 04/28/21  [name], MD  oxybutynin (DITROPAN) 5 MG tablet TAKE 1 TABLET 2 TO 3 TIMES A DAY FOR BLADDER SPASMS 04/20/21   [name], NP  PEG-KCl-NaCl-NaSulf-Na Asc-C (PLENVU) 140 g SOLR Take 140 g by mouth as directed. Manufacturer's coupon Universal coupon code:BIN: P2366821; GROUP: [id number]; PCN: [id number]; ID: [id number]; PAY NO MORE $50 03/03/20    Thornton Park, MD  rosuvastatin (CRESTOR) 10 MG tablet Take 1 tablet (10 mg total) by mouth daily. 12/14/20 03/14/21  [name], MD  Vitamin D, Ergocalciferol, (DRISDOL) 1.25 MG (50000 UNIT) CAPS capsule Take 1 capsule (50,000 Units total) by mouth every 7 (seven) days. 11/28/19   [name], NP  XARELTO 20 MG TABS tablet TAKE ONE TABLET BY MOUTH WITH SUPPER 04/07/21   Nils Pyle,  Kriste Basque, NP    Allergies    Aspirin  Review of Systems   Review of Systems  Constitutional:  Negative for fever.  HENT:  Negative for ear pain.   Eyes:  Negative for pain.  Respiratory:  Negative for cough.   Cardiovascular:  Negative for chest pain.  Gastrointestinal:  Negative for abdominal pain.  Genitourinary:  Negative for flank pain.  Musculoskeletal:  Negative for back pain.  Skin:  Negative for rash.  Neurological:  Positive for headaches.   Physical Exam Updated Vital Signs BP 114/84   Pulse (!) 130   Temp 98.9 F (37.2 C) (Oral)   Resp 19   Ht 6' (1.829 m)   Wt 129.7 kg   SpO2 100%   BMI 38.79 kg/m   Physical Exam Constitutional:      General: She is not in acute distress.    Appearance: Normal appearance.  HENT:     Head: Normocephalic.     Nose: Nose normal.  Eyes:     Extraocular Movements: Extraocular movements intact.  Cardiovascular:     Rate and Rhythm: Tachycardia present. Rhythm irregular.  Pulmonary:     Effort: Pulmonary effort is normal.  Musculoskeletal:        General: Normal range of motion.     Cervical back: Normal range of motion.  Neurological:     General: No focal deficit present.     Mental Status: She is alert and oriented to person, place, and time. Mental status is at baseline.     Motor: No weakness.     Gait: Gait normal.    ED Results / Procedures / Treatments   Labs (all labs ordered are listed, but only abnormal results are displayed) Labs Reviewed  CBC WITH DIFFERENTIAL/PLATELET - Abnormal; Notable for the following  components:      Result Value   WBC 13.2 (*)    Neutro Abs 9.2 (*)    Monocytes Absolute 1.5 (*)    All other components within normal limits  BASIC METABOLIC PANEL - Abnormal; Notable for the following components:   Glucose, Bld 205 (*)    Creatinine, Ser 1.48 (*)    GFR, Estimated 39 (*)    All other components within normal limits  RESP PANEL BY RT-PCR (FLU A&B, COVID) ARPGX2  CBC  CREATININE, SERUM  HIV ANTIBODY (ROUTINE TESTING W REFLEX)  BRAIN NATRIURETIC PEPTIDE  URINALYSIS, ROUTINE W REFLEX MICROSCOPIC  TSH  SEDIMENTATION RATE  C-REACTIVE PROTEIN  MAGNESIUM    EKG EKG Interpretation  Date/Time:  Saturday May 22 2021 12:15:22 EST Ventricular Rate:  133 PR Interval:    QRS Duration: 74 QT Interval:  268 QTC Calculation: 398 R Axis:   6 Text Interpretation: Atrial fibrillation with rapid ventricular response Abnormal ECG Confirmed by Thamas Jaegers ([id number]) on 05/22/2021 12:33:33 PM  Radiology CT Head Wo Contrast  Result Date: 05/22/2021 CLINICAL DATA:  Head trauma, coagulopathy (Age 11-64y) EXAM: CT HEAD WITHOUT CONTRAST TECHNIQUE: Contiguous axial images were obtained from the base of the skull through the vertex without intravenous contrast. COMPARISON:  March 17, 2020. FINDINGS: Evaluation of particularly the skull base is limited secondary to patient motion. Brain: No evidence of acute infarction, hemorrhage, hydrocephalus, extra-axial collection or mass lesion/mass effect. Periventricular white matter hypodensities consistent with sequela of chronic microvascular ischemic disease. Vascular: Vascular calcifications of the carotid siphons. Skull: No acute fracture within the limitations of the exam. Sinuses/Orbits: No acute finding. Other: None. IMPRESSION: No acute intracranial abnormality  within the limitations of the exam. Electronically Signed   By: Valentino Saxon M.D.   On: 05/22/2021 13:51   DG Knee Complete 4 Views Left  Result Date:  05/22/2021 CLINICAL DATA:  Fall. EXAM: LEFT KNEE - COMPLETE 4+ VIEW COMPARISON:  MRI left knee dated April 06, 2021. Left knee x-rays dated February 06, 2021. FINDINGS: No acute fracture or dislocation. Trace joint effusion. Unchanged heterotopic ossification along the medial femoral condyle related to old MCL injury. 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 Joint spaces are preserved. Tricompartmental marginal osteophytes. Soft tissues are unremarkable. IMPRESSION: 1. No acute osseous abnormality. 2. Mild tricompartmental osteoarthritis. Electronically Signed   By: Titus Dubin M.D.   On: 05/22/2021 13:31    Procedures .Critical Care Performed by: Luna Fuse, MD Authorized by: Luna Fuse, MD   Critical care provider statement:    Critical care time (minutes):  40   Critical care time was exclusive of:  Separately billable procedures and treating other patients and teaching time   Critical care was necessary to treat or prevent imminent or life-threatening deterioration of the following conditions:  Cardiac failure   Medications Ordered in ED Medications  diltiazem (CARDIZEM) 125 mg in dextrose 5% 125 mL (1 mg/mL) infusion (has no administration in time range)  heparin injection 5,000 Units (has no administration in time range)  sodium chloride flush (NS) 0.9 % injection 3 mL (has no administration in time range)  0.9 %  sodium chloride infusion (has no administration in time range)  sodium chloride flush (NS) 0.9 % injection 3 mL (has no administration in time range)  sodium chloride flush (NS) 0.9 % injection 3 mL (has no administration in time range)  0.9 %  sodium chloride infusion (has no administration in time range)  acetaminophen (TYLENOL) tablet 650 mg (has no administration in time range)    Or  acetaminophen (TYLENOL) suppository 650 mg (has no administration in time range)  oxyCODONE (Oxy IR/ROXICODONE) immediate release tablet 5 mg (has no administration in  time range)  HYDROmorphone (DILAUDID) injection 0.5-1 mg (has no administration in time range)  traZODone (DESYREL) tablet 25 mg (has no administration in time range)  senna-docusate (Senokot-S) tablet 1 tablet (has no administration in time range)  bisacodyl (DULCOLAX) EC tablet 5 mg (has no administration in time range)  ondansetron (ZOFRAN) tablet 4 mg (has no administration in time range)    Or  ondansetron (ZOFRAN) injection 4 mg (has no administration in time range)  ipratropium (ATROVENT) nebulizer solution 0.5 mg (has no administration in time range)  levalbuterol (XOPENEX) nebulizer solution 0.63 mg (has no administration in time range)  hydrALAZINE (APRESOLINE) injection 10 mg (has no administration in time range)  acetaminophen (TYLENOL) tablet 650 mg (650 mg Oral Given 05/22/21 1410)  metoprolol tartrate (LOPRESSOR) tablet 25 mg (25 mg Oral Given 05/22/21 1410)  0.9 %  sodium chloride infusion ( Intravenous Bolus 05/22/21 1411)    ED Course  I have reviewed the triage vital signs and the nursing notes.  Pertinent labs & imaging results that were available during my care of the patient were reviewed by me and considered in my medical decision making (see chart for details).    MDM Rules/Calculators/A&P                           Show white count of 13 chemistry unremarkable.  Patient remains in atrial fibrillation with rapid rate.  Given  metoprolol with mild improvement only.  Blood pressure is slightly low at 115-220.  Diltiazem ordered but will titrate to hold for blood pressure less than 100.   Final Clinical Impression(s) / ED Diagnoses Final diagnoses:  Atrial fibrillation with rapid ventricular response [facility])    Rx / DC Orders ED Discharge Orders     None        Luna Fuse, MD 05/22/21 1445

## 2021-05-22 NOTE — ED Notes (Signed)
Me and another NT went to pt room cleaned pt after BM

## 2021-05-22 NOTE — ED Triage Notes (Signed)
Pt states she hit her head. No LOC. Takes xerelto.

## 2021-05-23 ENCOUNTER — Inpatient Hospital Stay (HOSPITAL_COMMUNITY): Payer: Medicaid Other

## 2021-05-23 DIAGNOSIS — E119 Type 2 diabetes mellitus without complications: Secondary | ICD-10-CM | POA: Diagnosis not present

## 2021-05-23 DIAGNOSIS — M1711 Unilateral primary osteoarthritis, right knee: Secondary | ICD-10-CM

## 2021-05-23 DIAGNOSIS — I10 Essential (primary) hypertension: Secondary | ICD-10-CM

## 2021-05-23 DIAGNOSIS — N390 Urinary tract infection, site not specified: Secondary | ICD-10-CM | POA: Diagnosis present

## 2021-05-23 DIAGNOSIS — K219 Gastro-esophageal reflux disease without esophagitis: Secondary | ICD-10-CM

## 2021-05-23 LAB — GLUCOSE, CAPILLARY
Glucose-Capillary: 165 mg/dL — ABNORMAL HIGH (ref 70–99)
Glucose-Capillary: 166 mg/dL — ABNORMAL HIGH (ref 70–99)
Glucose-Capillary: 252 mg/dL — ABNORMAL HIGH (ref 70–99)

## 2021-05-23 LAB — GASTROINTESTINAL PANEL BY PCR, STOOL (REPLACES STOOL CULTURE)

## 2021-05-23 LAB — CBC
HCT: 35.5 % — ABNORMAL LOW (ref 36.0–46.0)
Hemoglobin: 11.9 g/dL — ABNORMAL LOW (ref 12.0–15.0)
MCH: 29.8 pg (ref 26.0–34.0)
MCHC: 33.5 g/dL (ref 30.0–36.0)
MCV: 88.8 fL (ref 80.0–100.0)
Platelets: 189 10*3/uL (ref 150–400)
RBC: 4 MIL/uL (ref 3.87–5.11)
RDW: 14.4 % (ref 11.5–15.5)
WBC: 11.3 10*3/uL — ABNORMAL HIGH (ref 4.0–10.5)
nRBC: 0 % (ref 0.0–0.2)

## 2021-05-23 LAB — BASIC METABOLIC PANEL
Anion gap: 7 (ref 5–15)
BUN: 14 mg/dL (ref 8–23)
CO2: 24 mmol/L (ref 22–32)
Calcium: 8.5 mg/dL — ABNORMAL LOW (ref 8.9–10.3)
Chloride: 105 mmol/L (ref 98–111)
Creatinine, Ser: 1.07 mg/dL — ABNORMAL HIGH (ref 0.44–1.00)
GFR, Estimated: 58 mL/min — ABNORMAL LOW (ref >60)
Glucose, Bld: 155 mg/dL — ABNORMAL HIGH (ref 70–99)
Potassium: 3.6 mmol/L (ref 3.5–5.1)
Sodium: 136 mmol/L (ref 135–145)

## 2021-05-23 LAB — HIV ANTIBODY (ROUTINE TESTING W REFLEX): HIV Screen 4th Generation wRfx: NONREACTIVE

## 2021-05-23 MED ORDER — GERHARDT'S BUTT CREAM
TOPICAL_CREAM | Freq: Two times a day (BID) | CUTANEOUS | Status: DC
  Filled 2021-05-23: qty 1

## 2021-05-23 MED ORDER — CHLORHEXIDINE GLUCONATE CLOTH 2 % EX PADS
6.0000 | MEDICATED_PAD | Freq: Every day | CUTANEOUS | Status: DC
  Administered 2021-05-23 – 2021-05-24 (×2): 6 via TOPICAL

## 2021-05-23 MED ORDER — NICOTINE 14 MG/24HR TD PT24
14.0000 mg | MEDICATED_PATCH | Freq: Every day | TRANSDERMAL | Status: DC
  Administered 2021-05-23 – 2021-05-24 (×2): 14 mg via TRANSDERMAL
  Filled 2021-05-23 (×2): qty 1

## 2021-05-23 MED ORDER — AMLODIPINE BESYLATE 5 MG PO TABS
5.0000 mg | ORAL_TABLET | Freq: Every day | ORAL | Status: DC
  Administered 2021-05-23 – 2021-05-24 (×2): 5 mg via ORAL
  Filled 2021-05-23 (×2): qty 1

## 2021-05-23 MED ORDER — INSULIN GLARGINE-YFGN 100 UNIT/ML ~~LOC~~ SOLN
20.0000 [IU] | Freq: Two times a day (BID) | SUBCUTANEOUS | Status: DC
  Administered 2021-05-23 (×2): 20 [IU] via SUBCUTANEOUS
  Filled 2021-05-23 (×5): qty 0.2

## 2021-05-23 MED ORDER — METOPROLOL TARTRATE 50 MG PO TABS
50.0000 mg | ORAL_TABLET | Freq: Two times a day (BID) | ORAL | Status: DC
  Administered 2021-05-23 – 2021-05-24 (×3): 50 mg via ORAL
  Filled 2021-05-23 (×3): qty 1

## 2021-05-23 NOTE — Progress Notes (Signed)
PROGRESS NOTE    Patient: Rhonda Hickman                            PCP: [name], NP                    DOB: 11-Sep-1955            DOA: 05/22/2021 ZOX:[id number]             DOS: 05/23/2021, 9:42 AM   LOS: 1 day   Date of Service: The patient was seen and examined on 05/23/2021  Subjective:   The patient was seen and examined this morning. Hemodynamically stable, mildly hypertensive. Denies any chest pain or palpitations morning, no issues overnight.  Brief Narrative:   Rhonda Hickman is a 65 y.o. female with medical history significant of chronic iron deficiency anemia, bipolar 1, anxiety, asthma, chronic atrial fibrillation on Xarelto, chronic left and right knee pain, COPD, DM2, HTN, HLD, neuropathy ?  CVA versus TIA... Presents status post fall due to abnormal gait due to knee pain. Initial work-up in ED was negative, but patient was found in A. fib with RVR, IV metoprolol was given, patient heart rate was still elevated  Assessment & Plan:   Principal Problem:   A-fib ([place]) Active Problems:   Chronic atrial fibrillation (HCC)   Type 2 diabetes mellitus (HCC)   Cocaine abuse (HCC)   Meniscus, lateral, derangement, right   Arthritis of knee, right   Gait abnormality   Obesity, Class III, BMI 40-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) ([place])   Gastroesophageal reflux disease   Bipolar 1 disorder ([place])   Hypertension   HLD (hyperlipidemia)   Fall at home, initial encounter   Leukocytosis    Principal Problem:   A-fib ([place])  with RVR-  -Did not respond initial dose of metoprolol in ED, was started on Cardizem drip, -Heart rate has much improved, currently normal sinus rhythm with a rate of 71 - discontinue diltiazem drip -Home medication of metoprolol will be initiated with a higher dose for better rate control - -Continue home medication of Xarelto -TSH : 1.28   We will transfer the patient out of ICU/stepdown today 05/24/2019  Status post fall -Accidental, status  post evaluation negative for any trauma  -CT of the head negative -Fall precautions -Increased risk of fall due to right meniscus tear, arthritis, gait abnormality -Pending PT/OT   Meniscus, lateral, derangement, right/   Arthritis of Rt and Lt knee/  Gait abnormality -As needed analgesics -Supplement vitamin D -PT/OT consulted for evaluation recommendation -Fall precaution Left knee x-ray: IMPRESSION: 1. No acute osseous abnormality. 2. Mild tricompartmental osteoarthritis.     Active Problems:   Chronic atrial fibrillation (HCC) -on Xarelto and metoprolol (increased dose)    Type 2 diabetes mellitus ([place]) -uncontrolled -Checking hemoglobin A1c: 8.9 Last A1c: 10.3 on 03/10/21  -We will monitor CBG q. [place] S with SSI coverage -Reviewed home medication continue Lantus 20 units twice daily, holding Januvia  -Resume Glucophage -Discussed with patient regarding compliance, tighter glycemic control, diabetic diet and weight loss     UTI-Leukocytosis -WBC 13.2, afebrile, normotensive -Obtaining UA... Positive for nitrite, leukocyte esterase -Initiating IV antibiotics of Rocephin       AKI -BUN 15, creatinine 1.48 >> 1.07 -Baseline creatinine over past year 0.9-1.78 -Status post IV fluid resuscitation       Obesity, Class III, BMI 40-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) ([place]) Body mass index is  38.79 kg/m. -Consult patient regarding healthy diet, exercise, active weight loss      Gastroesophageal reflux disease -Continue PPI   Bipolar 1 disorder (HCC) -Currently stable, home medication reviewed Celexa and Wellbutrin - while holding Wellbutrin due to increased risk of falls -Stable mood    Hypertension -Stable home medication of metoprolol, Norvasc  -increasing metoprolol -Restarting Norvasc    HLD (hyperlipidemia) -Continue statin, on Crestor     H/o  Cocaine abuse ([place]) -per pt 20 yrs ago -Checking urine drug screen: Positive for opioid (home medication)  Cultures:   -Urine >> Antimicrobial: -IV Rocephin   Consults called:  None -------------------------------------------------------------------------------------------------------------------------------------------- DVT prophylaxis: SCD/Compression stockings and WG:[id number]  Code Status:   Code Status: Full Code     Admission status: Patient will be admitted as Inpatient, with a greater than 2 midnight length of stay. Level of care: Stepdown     Family Communication:  none at bedside  (The above findings and plan of care has been discussed with patient in detail, the patient expressed understanding and agreement of above plan)  ----------------------------------------------------------------------------------------------------------------------------------   Disposition Plan:  Anticipated 1-2 days Status is: Inpatient   Remains inpatient appropriate because: Requiring close monitoring on a monitored bed, IV fluids, and IV medication for better heart rate control       Level of care: Stepdown   Procedures:   No admission procedures for hospital encounter.    Antimicrobials:  Anti-infectives (From admission, onward)    Start     Dose/Rate Route Frequency Ordered Stop   05/23/21 0000  cefTRIAXone (ROCEPHIN) 1 g in sodium chloride 0.9 % 100 mL IVPB        1 g 200 mL/hr over 30 Minutes Intravenous Every 24 hours 05/22/21 2313          Medication:   amLODipine  5 mg Oral Daily   calcium-vitamin D  1 tablet Oral Q breakfast   Chlorhexidine Gluconate Cloth  6 each Topical Daily   citalopram  40 mg Oral Daily   ferrous sulfate  325 mg Oral Q breakfast   fluticasone furoate-vilanterol  1 puff Inhalation Daily   gabapentin  300 mg Oral Q8H   insulin aspart  0-9 Units Subcutaneous TID WC   insulin glargine-yfgn  20 Units Subcutaneous BID   metFORMIN  1,000 mg Oral BID WC   metoprolol tartrate  50 mg Oral BID   rivaroxaban  20 mg Oral Q supper   rosuvastatin  10 mg Oral Daily    sodium chloride flush  3 mL Intravenous Q12H   sodium chloride flush  3 mL Intravenous Q12H    sodium chloride, acetaminophen **OR** acetaminophen, bisacodyl, hydrALAZINE, HYDROcodone-acetaminophen, HYDROmorphone (DILAUDID) injection, ipratropium, levalbuterol, metoCLOPramide (REGLAN) injection, ondansetron **OR** ondansetron (ZOFRAN) IV, oxybutynin, oxyCODONE, senna-docusate, sodium chloride flush, traZODone   Objective:   Vitals:   05/23/21 0547 05/23/21 0600 05/23/21 0700 05/23/21 0801  BP:   (!) 156/77   Pulse:      Resp:  (!) 24 14   Temp: 98.4 F (36.9 C)   100.1 F (37.8 C)  TempSrc: Oral   Axillary  SpO2:   97% 95%  Weight:      Height:        Intake/Output Summary (Last 24 hours) at 05/23/2021 0942 Last data filed at 05/23/2021 0622 Gross per 24 hour  Intake 1528.01 ml  Output 400 ml  Net 1128.01 ml   Filed Weights   05/22/21 1159 05/22/21 2047 05/23/21 0500  Weight: 129.7 kg  127.3 kg 127.3 kg     Examination:   Physical Exam  Constitution:  Alert, cooperative, no distress,  Appears calm and comfortable  Psychiatric: Normal and stable mood and affect, cognition intact,   HEENT: Normocephalic, PERRL, otherwise with in Normal limits  Chest:Chest symmetric Cardio vascular: S1-S2 irregularly irregular no murmure, No Rubs or Gallops  pulmonary: Clear to auscultation bilaterally, respirations unlabored, negative wheezes / crackles Abdomen: Soft, non-tender, non-distended, bowel sounds,no masses, no organomegaly Muscular skeletal: Limited exam - in bed, able to move all 4 extremities, Normal strength,  Neuro: CNII-XII intact. , normal motor and sensation, reflexes intact  Extremities: No pitting edema lower extremities, +2 pulses  Skin: Dry, warm to touch, negative for any Rashes, No open wounds Wounds: per nursing documentation     ------------------------------------------------------------------------------------------------------------------------------------------    LABs:  CBC Latest Ref Rng & Units 05/23/2021 05/22/2021 08/05/2020  WBC 4.0 - 10.5 K/uL 11.3(H) 13.2(H) 8.8  Hemoglobin 12.0 - 15.0 g/dL 11.9(L) 13.1 12.8  Hematocrit 36.0 - 46.0 % 35.5(L) 37.8 38.9  Platelets 150 - 400 K/uL 189 209 201   CMP Latest Ref Rng & Units 05/23/2021 05/22/2021 03/10/2021  Glucose 70 - 99 mg/dL 155(H) 205(H) 203(H)  BUN 8 - 23 mg/dL 14 15 14   Creatinine 0.44 - 1.00 mg/dL 1.07(H) 1.48(H) 1.03(H)  Sodium 135 - 145 mmol/L 136 135 140  Potassium 3.5 - 5.1 mmol/L 3.6 4.0 5.3(H)  Chloride 98 - 111 mmol/L 105 100 99  CO2 22 - 32 mmol/L 24 25 25   Calcium 8.9 - 10.3 mg/dL 8.5(L) 9.2 10.1  Total Protein 6.0 - 8.5 g/dL - - 7.8  Total Bilirubin 0.0 - 1.2 mg/dL - - <0.2  Alkaline Phos 44 - 121 IU/L - - 67  AST 0 - 40 IU/L - - 20  ALT 0 - 32 IU/L - - 34(H)       Micro Results Recent Results (from the past 240 hour(s))  Resp Panel by RT-PCR (Flu A&B, Covid) Nasopharyngeal Swab     Status: None   Collection Time: 05/22/21  2:05 PM   Specimen: Nasopharyngeal Swab; Nasopharyngeal(NP) swabs in vial transport medium  Result Value Ref Range Status   SARS Coronavirus 2 by RT PCR NEGATIVE NEGATIVE Final    Comment: (NOTE) SARS-CoV-2 target nucleic acids are NOT DETECTED.  The SARS-CoV-2 RNA is generally detectable in upper respiratory specimens during the acute phase of infection. The lowest concentration of SARS-CoV-2 viral copies this assay can detect is 138 copies/mL. A negative result does not preclude SARS-Cov-2 infection and should not be used as the sole basis for treatment or other patient management decisions. A negative result may occur with  improper specimen collection/handling, submission of specimen other than nasopharyngeal swab, presence of viral mutation(s) within the areas targeted by this assay, and  inadequate number of viral copies(<138 copies/mL). A negative result must be combined with clinical observations, patient history, and epidemiological information. The expected result is Negative.  Fact Sheet for Patients:  EntrepreneurPulse.com.au  Fact Sheet for Healthcare Providers:  IncredibleEmployment.be  This test is no t yet approved or cleared by the [place] FDA and  has been authorized for detection and/or diagnosis of SARS-CoV-2 by FDA under an Emergency Use Authorization (EUA). This EUA will remain  in effect (meaning this test can be used) for the duration of the COVID-19 declaration under Section 564(b)(1) of the Act, 21 U.S.C.section 360bbb-3(b)(1), unless the authorization is terminated  or revoked sooner.  Influenza A by PCR NEGATIVE NEGATIVE Final   Influenza B by PCR NEGATIVE NEGATIVE Final    Comment: (NOTE) The Xpert Xpress SARS-CoV-2/FLU/RSV plus assay is intended as an aid in the diagnosis of influenza from Nasopharyngeal swab specimens and should not be used as a sole basis for treatment. Nasal washings and aspirates are unacceptable for Xpert Xpress SARS-CoV-2/FLU/RSV testing.  Fact Sheet for Patients: EntrepreneurPulse.com.au  Fact Sheet for Healthcare Providers: IncredibleEmployment.be  This test is not yet approved or cleared by the [place] FDA and has been authorized for detection and/or diagnosis of SARS-CoV-2 by FDA under an Emergency Use Authorization (EUA). This EUA will remain in effect (meaning this test can be used) for the duration of the COVID-19 declaration under Section 564(b)(1) of the Act, 21 U.S.C. section 360bbb-3(b)(1), unless the authorization is terminated or revoked.  Performed at [facility], [address], [place], [place] [address]   MRSA Next Gen by PCR, Nasal     Status: None   Collection Time: 05/22/21  8:49 PM   Specimen: Nasal  Mucosa; Nasal Swab  Result Value Ref Range Status   MRSA by PCR Next Gen NOT DETECTED NOT DETECTED Final    Comment: (NOTE) The GeneXpert MRSA Assay (FDA approved for NASAL specimens only), is one component of a comprehensive MRSA colonization surveillance program. It is not intended to diagnose MRSA infection nor to guide or monitor treatment for MRSA infections. Test performance is not FDA approved in patients less than 65 years old. Performed at [facility], [address] [place], [place] [address]     Radiology Reports CT ABDOMEN PELVIS WO CONTRAST  Result Date: 05/22/2021 CLINICAL DATA:  Left upper quadrant pain, vomiting, multiple falls EXAM: CT ABDOMEN AND PELVIS WITHOUT CONTRAST TECHNIQUE: Multidetector CT imaging of the abdomen and pelvis was performed following the standard protocol without IV contrast. COMPARISON:  09/01/2013 FINDINGS: Lower chest: Mild dependent atelectasis in the bilateral lung bases. Hepatobiliary: Mild hepatic steatosis. Status post cholecystectomy. No intrahepatic or extrahepatic ductal dilatation. Pancreas: Within normal limits. Spleen: Within normal limits. Adrenals/Urinary Tract: Adrenal glands are within normal limits. Nonspecific right perinephric stranding, new. Kidneys are otherwise within normal limits. No renal calculi or hydronephrosis. Bladder is within normal limits. Stomach/Bowel: Stomach is within normal limits. No evidence of bowel obstruction. Normal appendix (series 2/image 70). No colonic wall thickening or inflammatory changes. Vascular/Lymphatic: No evidence of abdominal aortic aneurysm. Atherosclerotic calcifications of the abdominal aorta and branch vessels. No suspicious abdominopelvic lymphadenopathy. Reproductive: Status post hysterectomy. No adnexal masses. Other: No abdominopelvic ascites. Musculoskeletal: Mild degenerative changes at L5-S1. IMPRESSION: Nonspecific right perinephric stranding, new. Differential considerations include  recent passage of a nonvisualized ureteral calculus or infection/pyelonephritis. Mild hepatic steatosis. Status post cholecystectomy and hysterectomy. Electronically Signed   By: Julian Hy M.D.   On: 05/22/2021 22:43   CT Head Wo Contrast  Result Date: 05/22/2021 CLINICAL DATA:  Head trauma, coagulopathy (Age 47-64y) EXAM: CT HEAD WITHOUT CONTRAST TECHNIQUE: Contiguous axial images were obtained from the base of the skull through the vertex without intravenous contrast. COMPARISON:  March 17, 2020. FINDINGS: Evaluation of particularly the skull base is limited secondary to patient motion. Brain: No evidence of acute infarction, hemorrhage, hydrocephalus, extra-axial collection or mass lesion/mass effect. Periventricular white matter hypodensities consistent with sequela of chronic microvascular ischemic disease. Vascular: Vascular calcifications of the carotid siphons. Skull: No acute fracture within the limitations of the exam. Sinuses/Orbits: No acute finding. Other: None. IMPRESSION: No acute intracranial abnormality within the  limitations of the exam. Electronically Signed   By: Valentino Saxon M.D.   On: 05/22/2021 13:51   DG Knee Complete 4 Views Left  Result Date: 05/22/2021 CLINICAL DATA:  Fall. EXAM: LEFT KNEE - COMPLETE 4+ VIEW COMPARISON:  MRI left knee dated April 06, 2021. Left knee x-rays dated February 06, 2021. FINDINGS: No acute fracture or dislocation. Trace joint effusion. Unchanged heterotopic ossification along the medial femoral condyle related to old MCL injury. 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 Joint spaces are preserved. Tricompartmental marginal osteophytes. Soft tissues are unremarkable. IMPRESSION: 1. No acute osseous abnormality. 2. Mild tricompartmental osteoarthritis. Electronically Signed   By: Titus Dubin M.D.   On: 05/22/2021 13:31    SIGNED: Deatra James, MD, FHM. [facility],  Pager (please use [email or web address] to  page/text) Please use Epic Secure Chat for non-urgent communication (7AM-7PM)  If 7PM-7AM, please contact night-coverage [email or web address], 05/23/2021, 9:42 AM

## 2021-05-23 NOTE — Evaluation (Signed)
Physical Therapy Evaluation Patient Details Name: Rhonda Hickman MRN: [id number] DOB: Jun 13, 1956 Today's Date: 05/23/2021  History of Present Illness  Rhonda Hickman is a 65 y.o. female with medical history significant of chronic iron deficiency anemia, bipolar 1, anxiety, asthma, chronic atrial fibrillation on Xarelto, chronic left and right knee pain, COPD, DM2, HTN, HLD, neuropathy ?  CVA versus TIA... Presents status post fall due to abnormal gait due to knee pain.  Initial work-up in ED was negative, but patient was found in A. fib with RVR, IV metoprolol was given, patient heart rate was still elevated.   Clinical Impression   Overall, demonstrates improved functional performance with ability to perform bed mobility and transfers with independence/modified independence relying on UE support due to left knee pain/instability and recent hx of falling.  Ambulation tolerance and safety limited due to left knee issues requiring cues for proper offloading of LLE with use of AD and limited distance due to deficits requiring frequent rest periods.  Continued PT services indicated while hospitalized to improve independence and safety with ambulation to facilitate safe return to home/community.  Would benefit from outpatient PT services to improve LLE function and stability to prepare for upcoming knee surgery.        Recommendations for follow up therapy are one component of a multi-disciplinary discharge planning process, led by the attending physician.  Recommendations may be updated based on patient status, additional functional criteria and insurance authorization.  Follow Up Recommendations Outpatient PT    Assistance Recommended at Discharge PRN  Functional Status Assessment Patient has had a recent decline in their functional status and demonstrates the ability to make significant improvements in function in a reasonable and predictable amount of time.  Equipment Recommendations  Rollator (4  wheels)    Recommendations for Other Services       Precautions / Restrictions        Mobility  Bed Mobility Overal bed mobility: Independent                  Transfers Overall transfer level: Modified independent Equipment used: Rolling walker (2 wheels)                    Ambulation/Gait Ambulation/Gait assistance: Min guard Gait Distance (Feet): 50 Feet Assistive device: Rolling walker (2 wheels) Gait Pattern/deviations: Step-to pattern          Stairs            Wheelchair Mobility    Modified Rankin (Stroke Patients Only)       Balance Overall balance assessment: Mild deficits observed, not formally tested                                           Pertinent Vitals/Pain Pain Assessment: 0-10 Pain Score: 5  Pain Location: left knee Pain Descriptors / Indicators: Aching Pain Intervention(s): Limited activity within patient's tolerance    Home Living Family/patient expects to be discharged to:: Private residence Living Arrangements: Alone Available Help at Discharge: Friend(s);Available PRN/intermittently Type of Home: Apartment Home Access: Level entry       Home Layout: One level Home Equipment: None      Prior Function Prior Level of Function : Independent/Modified Independent                     Hand Dominance  Extremity/Trunk Assessment   Upper Extremity Assessment Upper Extremity Assessment: Overall WFL for tasks assessed    Lower Extremity Assessment Lower Extremity Assessment: LLE deficits/detail LLE Deficits / Details: weakness/medial joint line pain. Pt reports she is scheduled for surgery on left knee       Communication      Cognition Arousal/Alertness: Awake/alert Behavior During Therapy: WFL for tasks assessed/performed Overall Cognitive Status: Within Functional Limits for tasks assessed                                          General  Comments      Exercises General Exercises - Lower Extremity Ankle Circles/Pumps: AROM;Both;20 reps Quad Sets: Strengthening;Both;20 reps Heel Slides: AAROM;Left;20 reps   Assessment/Plan    PT Assessment Patient needs continued PT services  PT Problem List Decreased activity tolerance;Decreased mobility;Decreased strength;Decreased balance       PT Treatment Interventions DME instruction;Gait training;Stair training;Functional mobility training;Therapeutic activities;Patient/family education;Neuromuscular re-education;Balance training;Therapeutic exercise;Manual techniques    PT Goals (Current goals can be found in the Care Plan section)  Acute Rehab PT Goals Patient Stated Goal: "Not fall down any more" PT Goal Formulation: With patient Time For Goal Achievement: 05/29/21 Potential to Achieve Goals: Good    Frequency Min 3X/week   Barriers to discharge        Co-evaluation               AM-PAC PT "6 Clicks" Mobility  Outcome Measure Help needed turning from your back to your side while in a flat bed without using bedrails?: None Help needed moving from lying on your back to sitting on the side of a flat bed without using bedrails?: None Help needed moving to and from a bed to a chair (including a wheelchair)?: None Help needed standing up from a chair using your arms (e.g., wheelchair or bedside chair)?: None Help needed to walk in hospital room?: A Little Help needed climbing 3-5 steps with a railing? : A Lot 6 Click Score: 21    End of Session Equipment Utilized During Treatment: Gait belt Activity Tolerance: Patient tolerated treatment well;Patient limited by pain Patient left: in bed;with call bell/phone within reach Nurse Communication: Mobility status PT Visit Diagnosis: Unsteadiness on feet (R26.81);Difficulty in walking, not elsewhere classified (R26.2)    Time: 1130-1200 PT Time Calculation (min) (ACUTE ONLY): 30 min   Charges:   PT  Evaluation $PT Eval Low Complexity: 1 Low PT Treatments $Gait Training: 8-22 mins $Therapeutic Exercise: 8-22 mins       12:13 PM, 05/23/21 M. Sherlyn Lees, PT, DPT Physical Therapist- [place] Office Number: [phone]

## 2021-05-23 NOTE — Plan of Care (Signed)
  Problem: Acute Rehab PT Goals(only PT should resolve) Goal: Pt Will Ambulate Outcome: Progressing Flowsheets (Taken 05/23/2021 1214) Pt will Ambulate:  > 125 feet  with modified independence  with other equipment (comment) Note: 4WW Goal: Pt/caregiver will Perform Home Exercise Program Outcome: Progressing Flowsheets (Taken 05/23/2021 1214) Pt/caregiver will Perform Home Exercise Program:  For increased strengthening  Independently Note: For LLE   12:15 PM, 05/23/21 M. Sherlyn Lees, PT, DPT Physical Therapist- [place] Office Number: [phone]

## 2021-05-24 DIAGNOSIS — F319 Bipolar disorder, unspecified: Secondary | ICD-10-CM | POA: Diagnosis not present

## 2021-05-24 DIAGNOSIS — M1711 Unilateral primary osteoarthritis, right knee: Secondary | ICD-10-CM | POA: Diagnosis not present

## 2021-05-24 DIAGNOSIS — I4811 Longstanding persistent atrial fibrillation: Secondary | ICD-10-CM | POA: Diagnosis not present

## 2021-05-24 DIAGNOSIS — E119 Type 2 diabetes mellitus without complications: Secondary | ICD-10-CM | POA: Diagnosis not present

## 2021-05-24 DIAGNOSIS — N390 Urinary tract infection, site not specified: Secondary | ICD-10-CM | POA: Diagnosis not present

## 2021-05-24 LAB — BASIC METABOLIC PANEL
Anion gap: 7 (ref 5–15)
BUN: 14 mg/dL (ref 8–23)
CO2: 24 mmol/L (ref 22–32)
Calcium: 8.5 mg/dL — ABNORMAL LOW (ref 8.9–10.3)
Chloride: 105 mmol/L (ref 98–111)
Creatinine, Ser: 1.04 mg/dL — ABNORMAL HIGH (ref 0.44–1.00)
GFR, Estimated: >60 mL/min (ref >60)
Glucose, Bld: 161 mg/dL — ABNORMAL HIGH (ref 70–99)
Potassium: 3.3 mmol/L — ABNORMAL LOW (ref 3.5–5.1)
Sodium: 136 mmol/L (ref 135–145)

## 2021-05-24 LAB — CBC
HCT: 32.1 % — ABNORMAL LOW (ref 36.0–46.0)
Hemoglobin: 10.7 g/dL — ABNORMAL LOW (ref 12.0–15.0)
MCH: 29.6 pg (ref 26.0–34.0)
MCHC: 33.3 g/dL (ref 30.0–36.0)
MCV: 88.7 fL (ref 80.0–100.0)
Platelets: 205 10*3/uL (ref 150–400)
RBC: 3.62 MIL/uL — ABNORMAL LOW (ref 3.87–5.11)
RDW: 14.2 % (ref 11.5–15.5)
WBC: 10.2 10*3/uL (ref 4.0–10.5)
nRBC: 0 % (ref 0.0–0.2)

## 2021-05-24 LAB — GLUCOSE, CAPILLARY: Glucose-Capillary: 215 mg/dL — ABNORMAL HIGH (ref 70–99)

## 2021-05-24 MED ORDER — GABAPENTIN 300 MG PO CAPS: 300.0000 mg | ORAL_CAPSULE | Freq: Three times a day (TID) | ORAL | 0 refills | Status: DC

## 2021-05-24 MED ORDER — CIPROFLOXACIN HCL 500 MG PO TABS: 500.0000 mg | ORAL_TABLET | Freq: Two times a day (BID) | ORAL | 0 refills | Status: AC

## 2021-05-24 MED ORDER — METOPROLOL TARTRATE 50 MG PO TABS: 50.0000 mg | ORAL_TABLET | Freq: Two times a day (BID) | ORAL | 0 refills | Status: DC

## 2021-05-24 MED ORDER — NICOTINE 14 MG/24HR TD PT24: 14.0000 mg | MEDICATED_PATCH | Freq: Every day | TRANSDERMAL | 2 refills | Status: DC

## 2021-05-24 NOTE — Progress Notes (Signed)
Discharge instructions given. Patient verbalized understanding.

## 2021-05-24 NOTE — TOC Transition Note (Addendum)
Transition of Care [facility]) - CM/SW Discharge Note   Patient Details  Name: Rhonda Hickman MRN: [id number] Date of Birth: Aug 14, 1955  [facility] CM/SW Contact:  Salome Arnt, LCSW Phone Number: 05/24/2021, 10:28 AM   Clinical Narrative:  Pt d/c today. TOC consulted for PCP/DME/Home health. Pt has PCP. Discussed PT recommendation for outpatient PT with patient. She states she will be having knee surgery in 2 weeks and wants to wait to set up PT until after that. Pt requests rollator and 3N1. Agreeable to refer to [facility]. Orders in and referred to [facility] with [facility] to drop ship. Pt reports she has a ride for later today. RN updated.      Final next level of care: Home/Self Care Barriers to Discharge: Barriers Resolved   Patient Goals and CMS Choice Patient states their goals for this hospitalization and ongoing recovery are:: return home   Choice offered to / list presented to : Patient  Discharge Placement                    Patient and family notified of of transfer: 05/24/21  Discharge Plan and Services                DME Arranged: 3-N-1, Walker rolling with seat DME Agency: [facility] Date DME Agency Contacted: 05/24/21 Time DME Agency Contacted: 1027 Representative spoke with at DME Agency: [place] ([place]) Interventions     Readmission Risk Interventions Readmission Risk Prevention Plan 08/05/2019  Transportation Screening Complete  PCP or Specialist Appt within 5-7 Days (No Data)  Home Care Screening Complete  Medication Review (RN CM) Complete  Some recent data might be hidden

## 2021-05-24 NOTE — Progress Notes (Signed)
OT Cancellation Note  Patient Details Name: Rhonda Hickman MRN: [id number] DOB: [birth year]   Cancelled Treatment:    Reason Eval/Treat Not Completed: Other (comment). Patient with planned discharge today and will be receiving HH PT. Per PT evaluation, patient is close to baseline requiring min guard to ambulate and demonstrates strength WFL. Will defer OT evaluation to [place]. If the [facility] PT sees need for OT they may request an evaluation. Thank you for the referral.    Ailene Ravel, OTR/L,CBIS  [phone]  05/24/2021, 9:25 AM

## 2021-05-24 NOTE — Progress Notes (Signed)
Inpatient Diabetes Program Recommendations  AACE/ADA: New Consensus Statement on Inpatient Glycemic Control   Target Ranges:  Prepandial:   less than 140 mg/dL      Peak postprandial:   less than 180 mg/dL (1-2 hours)      Critically ill patients:  140 - 180 mg/dL   Results for SUMEYA, YONTZ (MRN [id number]) as of 05/24/2021 07:18  Ref. Range 05/23/2021 08:00 05/23/2021 11:19 05/23/2021 16:53 05/24/2021 07:12  Glucose-Capillary Latest Ref Range: 70 - 99 mg/dL 165 (H) 166 (H) 252 (H) 215 (H)    Review of Glycemic Control  Diabetes history: DM2 Outpatient Diabetes medications: Lantus 20 units BID, Januvia 100 mg daily, Metformin 1000 mg BID Current orders for Inpatient glycemic control: Semglee 20 units BID, Novolog 0-9 units TID with meals, Metformin 1000 mg BID  Inpatient Diabetes Program Recommendations:    Insulin: Please consider increasing Semglee to 22 units BID and adding Novolog 0-5 units QHS for bedtime correction.  Diet: Please consider changing diet from Regular to Carb Modified.  Thanks, Barnie Alderman, RN, MSN, CDE Diabetes Coordinator Inpatient Diabetes Program [phone] (Team Pager from 8am to 5pm)

## 2021-05-24 NOTE — Discharge Summary (Signed)
Physician Discharge Summary [facility]    Patient: Rhonda Hickman                   Admit date: 05/22/2021   DOB: Dec 25, 1955             Discharge date:05/24/2021/9:03 AM QMV:[id number]                          PCP: [name], NP  Disposition:    HOME with Home Health   Recommendations for Outpatient Follow-up:   Follow up: Follow-up with PCP within 1 to 2 weeks, further adjustment of home medication needed On this admission metoprolol has been increased, for better heart rate control.   Discharge Condition: Stable   Code Status:   Code Status: Full Code  Diet recommendation: Cardiac diet   Discharge Diagnoses:    Principal Problem:   A-fib ([place]) Active Problems:   Chronic atrial fibrillation (HCC)   Type 2 diabetes mellitus (HCC)   Cocaine abuse ([place])   Meniscus, lateral, derangement, right   Arthritis of knee, right   Gait abnormality   Obesity, Class III, BMI 40-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) ([place])   Gastroesophageal reflux disease   Bipolar 1 disorder ([place])   Hypertension   HLD (hyperlipidemia)   Fall at home, initial encounter   Acute lower UTI   Leukocytosis   History of Present Illness/ Hospital Course Rhonda Hickman Summary:    [name] is a 65 y.o. female with medical history significant of chronic iron deficiency anemia, bipolar 1, anxiety, asthma, chronic atrial fibrillation on Xarelto, chronic left and right knee pain, COPD, DM2, HTN, HLD, neuropathy ?  CVA versus TIA... Presents status post fall due to abnormal gait due to knee pain. Initial work-up in ED was negative, but patient was found in A. fib with RVR, IV metoprolol was given, patient heart rate was still elevated       A-fib (HCC)  with RVR-  -Did not respond initial dose of metoprolol in ED, was started on Cardizem drip, -Heart rate has much improved, currently normal sinus rhythm with a rate of 71 - discontinue diltiazem drip.  -Heart rate remained stable on current dose of  metoprolol 50 mg p.o. twice daily -Continue home medication of Xarelto -TSH : 1.28      Status post fall -Accidental, status post evaluation negative for any trauma  -CT of the head negative -Fall precautions -Increased risk of fall due to right meniscus tear, arthritis, gait abnormality -PT/OT --- recommend home health, has been arranged, TOC assisting   Left knee pain/osteoarthritis Meniscus, lateral, derangement, right/   Arthritis of Rt and Lt knee/  Gait abnormality -Stable, as needed analgesics, PT at home  -Supplement vitamin D -Fall precaution Left knee x-ray: IMPRESSION: 1. No acute osseous abnormality. 2. Mild tricompartmental osteoarthritis.        Chronic atrial fibrillation (HCC) -on Xarelto and metoprolol (increased dose)    Type 2 diabetes mellitus ([place]) -uncontrolled -Checking hemoglobin A1c: 8.9 Last A1c: 10.3 on 03/10/21  -We will monitor CBG q. [place] with SSI coverage -Reviewed home medication continue Lantus 20 units twice daily, holding Januvia  -Resume Glucophage -Discussed with patient regarding compliance, tighter glycemic control, diabetic diet and weight loss     UTI-Leukocytosis -WBC 13.2, afebrile, normotensive -Obtaining UA... Positive for nitrite, leukocyte esterase -Initiating IV antibiotics of Rocephin>>> switch to p.o. ciprofloxacin       AKI -BUN 15, creatinine 1.48 >>  1.07 -Baseline creatinine over past year 0.9-1.78 -Status post IV fluid resuscitation       Obesity, Class III, BMI 40-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) ([place]) Body mass index is 38.79 kg/m. -Consult patient regarding healthy diet, exercise, active weight loss      Gastroesophageal reflux disease -Continue PPI   Bipolar 1 disorder (HCC) -Currently stable, home medication reviewed Celexa and Wellbutrin - while holding Wellbutrin due to increased risk of falls -Stable mood    Hypertension -Stable home medication of metoprolol, Norvasc  -increasing metoprolol -Restarting  Norvasc    HLD (hyperlipidemia) -Continue statin, on Crestor     H/o  Cocaine abuse ([place]) -per pt 20 yrs ago -Checking urine drug screen: Positive for opioid (home medication)   Cultures:  -Urine >> was not obtained Antimicrobial: -IV Rocephin >> p.o. ciprofloxacin   Consults called:  None -------------------------------------------------------------------------------------------------------------------------------------------- DVT prophylaxis: SCD/Compression stockings and WL:SLHTDSK  Code Status:   Code Status: Full Code      Discharge Instructions:   Discharge Instructions     Activity as tolerated - No restrictions   Complete by: As directed    Diet - low sodium heart healthy   Complete by: As directed    Discharge instructions   Complete by: As directed    Please take your medication as prescribed, follow with a PCP within 1-2 weeks for further evaluation of your current medication and adjustment   Increase activity slowly   Complete by: As directed         Medication List     STOP taking these medications    meclizine 12.5 MG tablet Commonly known as: ANTIVERT       TAKE these medications    Advocate Insulin Syringe 29G X 1/2" 0.3 ML Misc Generic drug: Insulin Syringe-Needle U-100 Use to inject insulin 5x daily.   albuterol 108 (90 Base) MCG/ACT inhaler Commonly known as: VENTOLIN HFA INHALE 2 PUFFS EVERY 6 HOURS AS NEEDED FOR WHEEZING OR SHORTNESS OF BREATH   amLODipine 10 MG tablet Commonly known as: NORVASC Take 1 tablet (10 mg total) by mouth daily. What changed: how much to take   budesonide-formoterol 160-4.5 MCG/ACT inhaler Commonly known as: SYMBICORT Inhale 2 puffs into the lungs 2 (two) times daily.   buPROPion 150 MG 12 hr tablet Commonly known as: Wellbutrin SR Take one tablet daily for 3 days. Then, take one tablet twice daily for 3 months.   calcium-vitamin D 500-200 MG-UNIT tablet Commonly known as: OSCAL WITH D TAKE  (1) TABLET DAILY WITH BREAKFAST.   citalopram 40 MG tablet Commonly known as: CELEXA Take 1 tablet (40 mg total) by mouth daily.   divalproex 500 MG 24 hr tablet Commonly known as: DEPAKOTE ER Take 1-2 tablets by mouth 2 (two) times daily. One tablet in the morning and 2 tablets at bedtime   FeroSul 325 (65 FE) MG tablet Generic drug: ferrous sulfate TAKE (1) TABLET DAILY WITH BREAKFAST.   fluticasone 50 MCG/ACT nasal spray Commonly known as: FLONASE Place 2 sprays into both nostrils daily.   gabapentin 300 MG capsule Commonly known as: NEURONTIN Take 1 capsule (300 mg total) by mouth every 8 (eight) hours. What changed:  how much to take when to take this   GNP UltiCare Pen Needles 32G X 4 MM Misc Generic drug: Insulin Pen Needle   HYDROcodone-acetaminophen 10-325 MG tablet Commonly known as: NORCO Take 1 tablet by mouth every 6 (six) hours as needed.   Januvia 100 MG tablet Generic drug: sitaGLIPtin TAKE  1 TABLET DAILY   Lantus SoloStar 100 UNIT/ML Solostar Pen Generic drug: insulin glargine Inject 20 Units into the skin 2 (two) times daily.   metFORMIN 1000 MG tablet Commonly known as: GLUCOPHAGE Take 1 tablet (1,000 mg total) by mouth 2 (two) times daily with a meal.   metoprolol tartrate 50 MG tablet Commonly known as: LOPRESSOR Take 1 tablet (50 mg total) by mouth 2 (two) times daily. What changed:  medication strength how much to take   nicotine 14 mg/24hr patch Commonly known as: NICODERM CQ - dosed in mg/24 hours Place 1 patch (14 mg total) onto the skin daily. Start taking on: May 25, 2021   oxybutynin 5 MG tablet Commonly known as: DITROPAN TAKE 1 TABLET 2 TO 3 TIMES A DAY FOR BLADDER SPASMS   Plenvu 140 g Solr Generic drug: PEG-KCl-NaCl-NaSulf-Na Asc-C Take 140 g by mouth as directed. Manufacturer's coupon Universal coupon code:BIN: P2366821; GROUP: [id number]; PCN: [id number]; ID: [id number]; PAY NO MORE $50   rosuvastatin 10 MG  tablet Commonly known as: CRESTOR Take 1 tablet (10 mg total) by mouth daily.   Vitamin D (Ergocalciferol) 1.25 MG (50000 UNIT) Caps capsule Commonly known as: DRISDOL Take 1 capsule (50,000 Units total) by mouth every 7 (seven) days.   Xarelto 20 MG Tabs tablet Generic drug: rivaroxaban TAKE ONE TABLET BY MOUTH WITH SUPPER        Allergies  Allergen Reactions   Aspirin Nausea Only and Other (See Comments)    Causes stomach pain     Quit smoking:  It is highly recommended that all people especially deals with diabetes to quit smoking or stay away from smoking, avoid secondhand smoking.   Vaccines:  Also highly recommended update your vaccines requirement, including SARS-CoV-2 , yearly  flu vaccine and pneumonia vaccine at least every 5 years.      Exercise: If you are able: 30 -60 minutes a day, 4 days a week, or 150 minutes a week.  The longer the better.  Combine stretch, strength, and aerobic activities.  If you were told in the past that you have high risk for cardiovascular diseases, you may seek evaluation by your heart doctor prior to initiating moderate to intense exercise programs.    One other important lifestyle recommendation is to ensure adequate sleep - at least 6-7 hours of uninterrupted sleep at night.  Procedures /Studies:   CT ABDOMEN PELVIS WO CONTRAST  Result Date: 05/22/2021 CLINICAL DATA:  Left upper quadrant pain, vomiting, multiple falls EXAM: CT ABDOMEN AND PELVIS WITHOUT CONTRAST TECHNIQUE: Multidetector CT imaging of the abdomen and pelvis was performed following the standard protocol without IV contrast. COMPARISON:  09/01/2013 FINDINGS: Lower chest: Mild dependent atelectasis in the bilateral lung bases. Hepatobiliary: Mild hepatic steatosis. Status post cholecystectomy. No intrahepatic or extrahepatic ductal dilatation. Pancreas: Within normal limits. Spleen: Within normal limits. Adrenals/Urinary Tract: Adrenal glands are within normal limits.  Nonspecific right perinephric stranding, new. Kidneys are otherwise within normal limits. No renal calculi or hydronephrosis. Bladder is within normal limits. Stomach/Bowel: Stomach is within normal limits. No evidence of bowel obstruction. Normal appendix (series 2/image 70). No colonic wall thickening or inflammatory changes. Vascular/Lymphatic: No evidence of abdominal aortic aneurysm. Atherosclerotic calcifications of the abdominal aorta and branch vessels. No suspicious abdominopelvic lymphadenopathy. Reproductive: Status post hysterectomy. No adnexal masses. Other: No abdominopelvic ascites. Musculoskeletal: Mild degenerative changes at L5-S1. IMPRESSION: Nonspecific right perinephric stranding, new. Differential considerations include recent passage of a nonvisualized ureteral calculus or infection/pyelonephritis. Mild hepatic steatosis.  Status post cholecystectomy and hysterectomy. Electronically Signed   By: Julian Hy M.D.   On: 05/22/2021 22:43   CT Head Wo Contrast  Result Date: 05/22/2021 CLINICAL DATA:  Head trauma, coagulopathy (Age 108-64y) EXAM: CT HEAD WITHOUT CONTRAST TECHNIQUE: Contiguous axial images were obtained from the base of the skull through the vertex without intravenous contrast. COMPARISON:  March 17, 2020. FINDINGS: Evaluation of particularly the skull base is limited secondary to patient motion. Brain: No evidence of acute infarction, hemorrhage, hydrocephalus, extra-axial collection or mass lesion/mass effect. Periventricular white matter hypodensities consistent with sequela of chronic microvascular ischemic disease. Vascular: Vascular calcifications of the carotid siphons. Skull: No acute fracture within the limitations of the exam. Sinuses/Orbits: No acute finding. Other: None. IMPRESSION: No acute intracranial abnormality within the limitations of the exam. Electronically Signed   By: Valentino Saxon M.D.   On: 05/22/2021 13:51   DG CHEST PORT 1 VIEW  Result  Date: 05/23/2021 CLINICAL DATA:  Chest pain EXAM: PORTABLE CHEST 1 VIEW COMPARISON:  March 17, 2020 FINDINGS: The cardiomediastinal silhouette is unchanged and enlarged in contour. No pleural effusion. No pneumothorax. No acute pleuroparenchymal abnormality. Visualized abdomen is unremarkable. IMPRESSION: No acute cardiopulmonary abnormality. Electronically Signed   By: Valentino Saxon M.D.   On: 05/23/2021 11:45   DG Knee Complete 4 Views Left  Result Date: 05/22/2021 CLINICAL DATA:  Fall. EXAM: LEFT KNEE - COMPLETE 4+ VIEW COMPARISON:  MRI left knee dated April 06, 2021. Left knee x-rays dated February 06, 2021. FINDINGS: No acute fracture or dislocation. Trace joint effusion. Unchanged heterotopic ossification along the medial femoral condyle related to old MCL injury. 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 Joint spaces are preserved. Tricompartmental marginal osteophytes. Soft tissues are unremarkable. IMPRESSION: 1. No acute osseous abnormality. 2. Mild tricompartmental osteoarthritis. Electronically Signed   By: Titus Dubin M.D.   On: 05/22/2021 13:31    Subjective:   Patient was seen and examined 05/24/2021, 9:03 AM Patient stable today. No acute distress.  No issues overnight Stable for discharge.  Discharge Exam:    Vitals:   05/24/21 0358 05/24/21 0500 05/24/21 0714 05/24/21 0838  BP: 117/70   129/72  Pulse: 80   70  Resp: 20     Temp: 98.3 F (36.8 C)     TempSrc:      SpO2: 100%  99%   Weight:  126.9 kg    Height:        General: Pt lying comfortably in bed & appears in no obvious distress. Cardiovascular: S1 & S2 heard, RRR, S1/S2 +. No murmurs, rubs, gallops or clicks. No JVD or pedal edema. Respiratory: Clear to auscultation without wheezing, rhonchi or crackles. No increased work of breathing. Abdominal:  Non-distended, non-tender & soft. No organomegaly or masses appreciated. Normal bowel sounds heard. CNS: Alert and oriented. No focal  deficits. Extremities: no edema, no cyanosis      The results of significant diagnostics from this hospitalization (including imaging, microbiology, ancillary and laboratory) are listed below for reference.      Microbiology:   Recent Results (from the past 240 hour(s))  Resp Panel by RT-PCR (Flu A&B, Covid) Nasopharyngeal Swab     Status: None   Collection Time: 05/22/21  2:05 PM   Specimen: Nasopharyngeal Swab; Nasopharyngeal(NP) swabs in vial transport medium  Result Value Ref Range Status   SARS Coronavirus 2 by RT PCR NEGATIVE NEGATIVE Final    Comment: (NOTE) SARS-CoV-2 target nucleic acids are NOT DETECTED.  The SARS-CoV-2 RNA is generally detectable in upper respiratory specimens during the acute phase of infection. The lowest concentration of SARS-CoV-2 viral copies this assay can detect is 138 copies/mL. A negative result does not preclude SARS-Cov-2 infection and should not be used as the sole basis for treatment or other patient management decisions. A negative result may occur with  improper specimen collection/handling, submission of specimen other than nasopharyngeal swab, presence of viral mutation(s) within the areas targeted by this assay, and inadequate number of viral copies(<138 copies/mL). A negative result must be combined with clinical observations, patient history, and epidemiological information. The expected result is Negative.  Fact Sheet for Patients:  EntrepreneurPulse.com.au  Fact Sheet for Healthcare Providers:  IncredibleEmployment.be  This test is no t yet approved or cleared by the [place] FDA and  has been authorized for detection and/or diagnosis of SARS-CoV-2 by FDA under an Emergency Use Authorization (EUA). This EUA will remain  in effect (meaning this test can be used) for the duration of the COVID-19 declaration under Section 564(b)(1) of the Act, 21 U.S.C.section 360bbb-3(b)(1), unless the  authorization is terminated  or revoked sooner.       Influenza A by PCR NEGATIVE NEGATIVE Final   Influenza B by PCR NEGATIVE NEGATIVE Final    Comment: (NOTE) The Xpert Xpress SARS-CoV-2/FLU/RSV plus assay is intended as an aid in the diagnosis of influenza from Nasopharyngeal swab specimens and should not be used as a sole basis for treatment. Nasal washings and aspirates are unacceptable for Xpert Xpress SARS-CoV-2/FLU/RSV testing.  Fact Sheet for Patients: EntrepreneurPulse.com.au  Fact Sheet for Healthcare Providers: IncredibleEmployment.be  This test is not yet approved or cleared by the [place] FDA and has been authorized for detection and/or diagnosis of SARS-CoV-2 by FDA under an Emergency Use Authorization (EUA). This EUA will remain in effect (meaning this test can be used) for the duration of the COVID-19 declaration under Section 564(b)(1) of the Act, 21 U.S.C. section 360bbb-3(b)(1), unless the authorization is terminated or revoked.  Performed at [facility], [address] [place], [place] [address]   Gastrointestinal Panel by PCR , Stool     Status: None   Collection Time: 05/22/21  8:00 PM   Specimen: Stool  Result Value Ref Range Status   Campylobacter species NOT DETECTED NOT DETECTED Final   Plesimonas shigelloides NOT DETECTED NOT DETECTED Final   Salmonella species NOT DETECTED NOT DETECTED Final   Yersinia enterocolitica NOT DETECTED NOT DETECTED Final   Vibrio species NOT DETECTED NOT DETECTED Final   Vibrio cholerae NOT DETECTED NOT DETECTED Final   Enteroaggregative E coli (EAEC) NOT DETECTED NOT DETECTED Final   Enteropathogenic E coli (EPEC) NOT DETECTED NOT DETECTED Final   Enterotoxigenic E coli (ETEC) NOT DETECTED NOT DETECTED Final   Shiga like toxin producing E coli (STEC) NOT DETECTED NOT DETECTED Final   Shigella/Enteroinvasive E coli (EIEC) NOT DETECTED NOT DETECTED Final   Cryptosporidium NOT  DETECTED NOT DETECTED Final   Cyclospora cayetanensis NOT DETECTED NOT DETECTED Final   Entamoeba histolytica NOT DETECTED NOT DETECTED Final   Giardia lamblia NOT DETECTED NOT DETECTED Final   Adenovirus F40/41 NOT DETECTED NOT DETECTED Final   Astrovirus NOT DETECTED NOT DETECTED Final   Norovirus GI/GII NOT DETECTED NOT DETECTED Final   Rotavirus A NOT DETECTED NOT DETECTED Final   Sapovirus (I, II, IV, and V) NOT DETECTED NOT DETECTED Final    Comment: Performed at [facility] [place],  [place] [address]  MRSA Next Gen by PCR, Nasal     Status: None   Collection Time: 05/22/21  8:49 PM   Specimen: Nasal Mucosa; Nasal Swab  Result Value Ref Range Status   MRSA by PCR Next Gen NOT DETECTED NOT DETECTED Final    Comment: (NOTE) The GeneXpert MRSA Assay (FDA approved for NASAL specimens only), is one component of a comprehensive MRSA colonization surveillance program. It is not intended to diagnose MRSA infection nor to guide or monitor treatment for MRSA infections. Test performance is not FDA approved in patients less than 35 years old. Performed at [facility], [address], [place], [place] [address]      Labs:   CBC: Recent Labs  Lab 05/22/21 1218 05/23/21 0547 05/24/21 0412  WBC 13.2* 11.3* 10.2  NEUTROABS 9.2*  --   --   HGB 13.1 11.9* 10.7*  HCT 37.8 35.5* 32.1*  MCV 87.3 88.8 88.7  PLT 209 189 283   Basic Metabolic Panel: Recent Labs  Lab 05/22/21 1218 05/23/21 0547 05/24/21 0412  NA 135 136 136  K 4.0 3.6 3.3*  CL 100 105 105  CO2 25 24 24   GLUCOSE 205* 155* 161*  BUN 15 14 14   CREATININE 1.48* 1.07* 1.04*  CALCIUM 9.2 8.5* 8.5*  MG 1.9  --   --    Liver Function Tests: No results for input(s): AST, ALT, ALKPHOS, BILITOT, PROT, ALBUMIN in the last 168 hours. BNP (last 3 results) Recent Labs    05/22/21 1453  BNP 184.0*   Cardiac Enzymes: No results for input(s): CKTOTAL, CKMB, CKMBINDEX, TROPONINI in the last 168  hours. CBG: Recent Labs  Lab 05/22/21 2157 05/23/21 0800 05/23/21 1119 05/23/21 1653 05/24/21 0712  GLUCAP 174* 165* 166* 252* 215*   Hgb A1c Recent Labs    05/22/21 1456  HGBA1C 8.9*   Lipid Profile No results for input(s): CHOL, HDL, LDLCALC, TRIG, CHOLHDL, LDLDIRECT in the last 72 hours. Thyroid function studies Recent Labs    05/22/21 1453  TSH 1.280   Anemia work up No results for input(s): VITAMINB12, FOLATE, FERRITIN, TIBC, IRON, RETICCTPCT in the last 72 hours. Urinalysis    Component Value Date/Time   COLORURINE YELLOW 05/22/2021 2000   APPEARANCEUR HAZY (A) 05/22/2021 2000   LABSPEC 1.010 05/22/2021 2000   PHURINE 6.0 05/22/2021 2000   GLUCOSEU NEGATIVE 05/22/2021 2000   HGBUR SMALL (A) 05/22/2021 2000   BILIRUBINUR NEGATIVE 05/22/2021 2000   BILIRUBINUR negative 12/08/2020 1009   BILIRUBINUR negative 05/04/2020 1349   KETONESUR 5 (A) 05/22/2021 2000   PROTEINUR NEGATIVE 05/22/2021 2000   UROBILINOGEN 0.2 12/08/2020 1009   UROBILINOGEN 0.2 09/12/2013 1642   NITRITE POSITIVE (A) 05/22/2021 2000   LEUKOCYTESUR LARGE (A) 05/22/2021 2000         Time coordinating discharge: Over 45 minutes  SIGNED: Deatra James, MD, FACP, [facility]. [facility],  Please use [email or web address] to Page If 7PM-7AM, please contact night-coverage [email or web address],  05/24/2021, 9:03 AM

## 2021-05-25 ENCOUNTER — Telehealth: Payer: Self-pay

## 2021-05-25 DIAGNOSIS — E119 Type 2 diabetes mellitus without complications: Secondary | ICD-10-CM | POA: Diagnosis not present

## 2021-05-25 NOTE — Telephone Encounter (Signed)
Transition Care Management Follow-up Telephone Call Date of discharge and from where: 05/24/2021, [facility]  How have you been since you were released from the hospital? She said she is doing better.  Any questions or concerns? Yes- she said that she has a lot going on and would like to speak with the SW. She was agreeable to having Asante give her a call.   Items Reviewed: Did the pt receive and understand the discharge instructions provided? Yes  Medications obtained and verified? Yes - she said she has all medications and did not have any questions about the med regime. She does not have a glucometer Other? No  Any new allergies since your discharge? No  Dietary orders reviewed? Yes Do you have support at home?  Lives alone. No family in the area to assist.  She relies on [facility] for assistance.   Home Care and Equipment/Supplies: Were home health services ordered? no If so, what is the name of the agency? N/a  Has the agency set up a time to come to the patient's home? not applicable Were any new equipment or medical supplies ordered?  Yes: rollator and 3:1 commode.  What is the name of the medical supply agency? [facility] [employer] Were you able to get the supplies/equipment? Yes,however there are problems - noted below.  Do you have any questions related to the use of the equipment or supplies? Yes: the rollator is too small and the 3:1 needs to be put together and she is not able to do that herself.  This CM suggested that her aide help with putting the 3:1 together and she said she won't ask her. The patient has contacted [facility] about the issues with the DME and she will now contact the SW at the hospital who assisted her with obtaining the equipment.   Functional Questionnaire: (I = Independent and D = Dependent) ADLs: her aide helps her as needed.  She receives PCS: 3 hours Mon-Fri and 2 hours S/S.  She is not pleased with the services. Instructed her to call [employer].   She said she has contacted them and there are issues with lack of staffing to support her request for days/hours of assistance.    Follow up appointments reviewed:  PCP Hospital f/u appt confirmed?  Patient had appointment with [name], NP scheduled for 06/09/2021 but she has surgery scheduled for 06/08/2021.  She will schedule appointment after she has her surgery.    [facility] f/u appt confirmed? Yes  Scheduled for knee surgery - 06/08/2021.  Neurology appointment - 06/23/2021.  Are transportation arrangements needed? No  - she uses [facility] and needs to give them a week notice.  If their condition worsens, is the pt aware to call PCP or go to the Emergency Dept.? Yes Was the patient provided with contact information for the PCP's office or ED? Yes Was to pt encouraged to call back with questions or concerns? Yes

## 2021-05-26 DIAGNOSIS — E119 Type 2 diabetes mellitus without complications: Secondary | ICD-10-CM | POA: Diagnosis not present

## 2021-05-27 ENCOUNTER — Telehealth: Payer: Self-pay | Admitting: Orthopedic Surgery

## 2021-05-27 DIAGNOSIS — E119 Type 2 diabetes mellitus without complications: Secondary | ICD-10-CM | POA: Diagnosis not present

## 2021-05-27 NOTE — Telephone Encounter (Signed)
I thought she was in pain management with [facility] I will call her

## 2021-05-27 NOTE — Telephone Encounter (Signed)
She is in pain management still with [facility], is out of meds, I have advised her to call [facility] and she states she will, to you [facility]

## 2021-05-27 NOTE — Telephone Encounter (Signed)
Patient called to ask if she may have something prescribed for pain - relays scheduled for knee surgery 06/08/21.  If approved, patient uses [employer] [employer].

## 2021-05-28 ENCOUNTER — Telehealth (INDEPENDENT_AMBULATORY_CARE_PROVIDER_SITE_OTHER): Payer: Self-pay | Admitting: Primary Care

## 2021-05-28 ENCOUNTER — Other Ambulatory Visit (HOSPITAL_BASED_OUTPATIENT_CLINIC_OR_DEPARTMENT_OTHER): Payer: Self-pay | Admitting: Family

## 2021-05-28 DIAGNOSIS — E119 Type 2 diabetes mellitus without complications: Secondary | ICD-10-CM | POA: Diagnosis not present

## 2021-05-28 DIAGNOSIS — Z720 Tobacco use: Secondary | ICD-10-CM

## 2021-05-28 NOTE — Telephone Encounter (Signed)
Attempted to call pt , no answer , left vm    

## 2021-05-28 NOTE — Telephone Encounter (Signed)
Attempted to call pt. And unable to leave VM About her refill request. Wellbutrin cannot be taken for longer than 3 months for smoking cessation. This refill is no longer appropriate. If there is a continual need pt. Should follow up with PCP

## 2021-05-28 NOTE — Telephone Encounter (Unsigned)
Pt is calling to request a refill for her Vitamin D, Ergocalciferol, (DRISDOL) 1.25 MG (50000 UNIT) CAPS capsule. Pt says that she was just made aware by her pharmacy that this Rx hasn't been filled in a while. Pt says that she wasn't aware that she hadn't been taking medication because she leave it to the pharmacy to help her maintain having her medications. She wasn't aware that pharmacy hadn't requested medication for her. Pt says that she was scheduled to have an apt with PCP on 11/30 but pt is having knee surgery. Pt would like to know if provider could write her a new Rx?      Pharmacy:  [place] [place], [place] Phone:  [phone]  Fax:  [phone]      CB: [phone]

## 2021-05-29 DIAGNOSIS — E119 Type 2 diabetes mellitus without complications: Secondary | ICD-10-CM | POA: Diagnosis not present

## 2021-05-30 DIAGNOSIS — E119 Type 2 diabetes mellitus without complications: Secondary | ICD-10-CM | POA: Diagnosis not present

## 2021-05-31 DIAGNOSIS — E119 Type 2 diabetes mellitus without complications: Secondary | ICD-10-CM | POA: Diagnosis not present

## 2021-05-31 NOTE — Telephone Encounter (Signed)
Patient aware that vitamin D has not been prescribed since may of 2021. Last vitamin D level was normal in august 2022. At this time no need for vitamin D prescription.

## 2021-05-31 NOTE — Patient Instructions (Signed)
Rhonda Hickman  05/31/2021     @PREFPERIOPPHARMACY @   Your procedure is scheduled on 06/08/2021.  Report to [name] at 8:30 A.M.  Call this number if you have problems the morning of surgery:  [phone]   Remember:  Do not eat or drink after midnight.   Do not take any diabetic medications the morning of surgery.    Please take 1/2  of your Lantus the night before the surgery  10 units    Take these medicines the morning of surgery with A SIP OF WATER : Amlodipine, Wellbutrin, Neurontin, Metoprolol, Celexa, Depakote and Norco.      Please use your inhaler before coming to the hospital the morning of the procedure.    Do not wear jewelry, make-up or nail polish.  Do not wear lotions, powders, or perfumes, or deodorant.  Do not shave 48 hours prior to surgery.  Men may shave face and neck.  Do not bring valuables to the hospital.  [facility] is not responsible for any belongings or valuables.  Contacts, dentures or bridgework may not be worn into surgery.  Leave your suitcase in the car.  After surgery it may be brought to your room.  For patients admitted to the hospital, discharge time will be determined by your treatment team.  Patients discharged the day of surgery will not be allowed to drive home.   Name and phone number of your driver:   Family Special instructions:  N/A  Please read over the following fact sheets that you were given. Care and Recovery After Surgery  Chlorhexidine Topical Solution What is this medication? CHLORHEXIDINE (klor HEX i deen) prevents skin infection. It is often used to clean and disinfect the skin after an injury or before a procedure. It works by killing or preventing the growth of bacteria on the skin. This medicine may be used for other purposes; ask your health care provider or pharmacist if you have questions. COMMON BRAND NAME(S): Betasept, Chlorostat, Hibiclens What should I tell my care team before I take this  medication? They need to know if you have any of the following conditions: Any skin rashes or problems An unusual or allergic reaction to chlorhexidine, other medications, foods, dyes, or preservatives Pregnant or trying to get pregnant Breast-feeding How should I use this medication? This medication is for external use only. Do not take by mouth. Follow the directions on the label or those given to you by your care team. Keep out of eyes, ears and mouth. This medication should not be used as a preoperative skin preparation of the face or head. Talk to your care team about the use of this medication in children. While this medication may be used for children for selected conditions, precautions do apply. Overdosage: If you think you have taken too much of this medicine contact a poison control center or emergency room at once. NOTE: This medicine is only for you. Do not share this medicine with others. What if I miss a dose? This does not apply; this medication is not for regular use. What may interact with this medication? Interactions are not expected. This list may not describe all possible interactions. Give your health care provider a list of all the medicines, herbs, non-prescription drugs, or dietary supplements you use. Also tell them if you smoke, drink alcohol, or use illegal drugs. Some items may interact with your medicine. What should I watch for while using this medication? This medication may cause severe allergic  reactions. Notify your care team right away if you think you are having an allergic reaction. Do not take this medication by mouth. Avoid contact with your ears and eyes. If contact with the eyes occur, rinse the eyes well with plenty of cool tap water. What side effects may I notice from receiving this medication? Side effects that you should report to your care team as soon as possible: Allergic reactions--skin rash, itching, hives, swelling of the face, lips, tongue,  or throat Side effects that usually do not require medical attention (report to your care team if they continue or are bothersome): Mild skin irritation, redness, or dryness This list may not describe all possible side effects. Call your doctor for medical advice about side effects. You may report side effects to FDA at 1-800-FDA-1088. Where should I keep my medication? Keep out of the reach of children. Store at room temperature between 15 and 30 degrees C (59 and 86 degrees F). Store away from direct light and heat. Do not freeze. Throw away any unused medication after the expiration date. NOTE: This sheet is a summary. It may not cover all possible information. If you have questions about this medicine, talk to your doctor, pharmacist, or health care provider.  2022 Elsevier/Gold Standard (2020-09-04 00:00:00)     Knee Arthroscopy Knee arthroscopy is a surgery to examine the inside of the knee joint and repair any damage to cartilage, surfaces, and other soft tissues around the joint. You may have this surgery if nonsurgical treatment has not relieved your symptoms. Knee arthroscopy may be used to: Repair a torn ligament or other torn tissues. Ligaments are tissues that connect bones to each other. Remove bone fragments. Remove a fluid-filled sac (cyst). Treat kneecap (patella)problems. Treat septic knee. This is an advanced infection in the knee. Arthroscopic surgery is done using a thin tube that has a light and camera on the end of it (arthroscope). The arthroscope is placed through a small incision, and the camera sends images to a screen in the operating room. The images are used to help perform the surgery. Tell a health care provider about: Any allergies you have. All medicines you are taking, including vitamins, herbs, eye drops, creams, and over-the-counter medicines. Any problems you or family members have had with anesthetic medicines. Any blood disorders you have. Any  surgeries you have had. Any medical conditions you have. Whether you are pregnant or may be pregnant. What are the risks? Generally, this is a safe procedure. However, problems may occur, including: Infection. Bleeding. Allergic reactions to medicines. Damage to blood vessels, nerves, or tissues in the knee. A blood clot that forms in the leg and travels to the lung (pulmonary embolism). Failure of the surgery to relieve symptoms. Knee stiffness. What happens before the procedure? Staying hydrated Follow instructions from your health care provider about hydration, which may include: Up to 2 hours before the procedure - you may continue to drink clear liquids, such as water, clear fruit juice, black coffee, and plain tea.  Eating and drinking restrictions Follow instructions from your health care provider about eating and drinking, which may include: 8 hours before the procedure - stop eating heavy meals or foods, such as meat, fried foods, or fatty foods. 6 hours before the procedure - stop eating light meals or foods, such as toast or cereal. 6 hours before the procedure - stop drinking milk or drinks that contain milk. 2 hours before the procedure - stop drinking clear liquids. Medicines Ask  your health care provider about: Changing or stopping your regular medicines. This is especially important if you are taking diabetes medicines or blood thinners. Taking medicines such as aspirin and ibuprofen. These medicines can thin your blood. Do not take these medicines unless your health care provider tells you to take them. Taking over-the-counter medicines, vitamins, herbs, and supplements. General instructions You may have a physical exam and tests, such as an X-ray, CT scan, or MRI. Do not drink alcohol unless your health care provider says that you can. Do not use any products that contain nicotine or tobacco for at least 4 weeks before the procedure. These products include cigarettes,  e-cigarettes, and chewing tobacco. If you need help quitting, ask your health care provider. Plan to have a responsible adult take you home from the hospital or clinic. If you will be going home right after the procedure, plan to have a responsible adult care for you for the time you are told. This is important. Ask your health care provider: How your surgery site will be marked. What steps will be taken to help prevent infection. These steps may include: Removing hair at the surgery site. Washing skin with a germ-killing soap. Taking antibiotic medicine. What happens during the procedure?  An IV will be inserted into one of your veins. You will be given one or more of the following: A medicine to help you relax (sedative). A medicine to numb the knee area (local anesthetic). A medicine to make you fall asleep (general anesthetic). A medicine that is injected into an area of your body to numb everything below the injection site (regional anesthetic). This may be injected into your groin or thigh. A cuff may be placed around your upper leg to slow blood flow to your lower leg during the procedure. Several small incisions will be made around your knee. Your knee joint will be rinsed (flushed) and filled with sterile saline. This is a germ-free solution made of salt and water. This expands the area to help your surgeon see your joint more clearly. An arthroscope will be passed through one of your incisions, into your knee joint. Other surgical instruments will be passed through the other incisions. Then, your surgeon will examine and repair your knee as needed. The sterile saline will be drained from your knee, and the cuff will be removed from your upper leg. Your incisions will be closed with adhesive strips or stitches, also called sutures, and covered with a bandage (dressing). The procedure may vary among health care providers and hospitals. What happens after the procedure?  Your blood  pressure, heart rate, breathing rate, and blood oxygen level will be monitored until you leave the hospital or clinic. You will be given pain medicine as needed. You may be given medicine to lower your risk of blood clots. You may have to wear compression stockings. These stockings help to prevent blood clots and reduce swelling in your legs. You may be given a knee brace or immobilizer. Do not drive or use machinery until your health care provider approves. Summary Knee arthroscopy is a surgery to examine or repair the inside of your knee joint. Before the procedure, follow instructions from your health care provider about eating and drinking. Plan to have a responsible adult take you home from the hospital or clinic. This information is not intended to replace advice given to you by your health care provider. Make sure you discuss any questions you have with your health care provider. Document Revised:  10/28/2019 Document Reviewed: 10/28/2019 Elsevier Patient Education  [place].

## 2021-06-01 DIAGNOSIS — E119 Type 2 diabetes mellitus without complications: Secondary | ICD-10-CM | POA: Diagnosis not present

## 2021-06-02 ENCOUNTER — Encounter (HOSPITAL_COMMUNITY)
Admission: RE | Admit: 2021-06-02 | Discharge: 2021-06-02 | Disposition: A | Discharge status: Home / Self Care | Payer: Medicaid Other | Source: Ambulatory Visit | Attending: Orthopedic Surgery | Admitting: Orthopedic Surgery

## 2021-06-02 DIAGNOSIS — G8929 Other chronic pain: Secondary | ICD-10-CM | POA: Diagnosis not present

## 2021-06-02 DIAGNOSIS — M25562 Pain in left knee: Secondary | ICD-10-CM | POA: Diagnosis not present

## 2021-06-02 DIAGNOSIS — Z01810 Encounter for preprocedural cardiovascular examination: Secondary | ICD-10-CM | POA: Insufficient documentation

## 2021-06-02 DIAGNOSIS — E119 Type 2 diabetes mellitus without complications: Secondary | ICD-10-CM | POA: Diagnosis not present

## 2021-06-02 DIAGNOSIS — I10 Essential (primary) hypertension: Secondary | ICD-10-CM | POA: Diagnosis not present

## 2021-06-04 DIAGNOSIS — E119 Type 2 diabetes mellitus without complications: Secondary | ICD-10-CM | POA: Diagnosis not present

## 2021-06-05 DIAGNOSIS — M5416 Radiculopathy, lumbar region: Secondary | ICD-10-CM | POA: Diagnosis not present

## 2021-06-05 DIAGNOSIS — M25561 Pain in right knee: Secondary | ICD-10-CM | POA: Diagnosis not present

## 2021-06-05 DIAGNOSIS — E119 Type 2 diabetes mellitus without complications: Secondary | ICD-10-CM | POA: Diagnosis not present

## 2021-06-05 DIAGNOSIS — G8929 Other chronic pain: Secondary | ICD-10-CM | POA: Diagnosis not present

## 2021-06-05 DIAGNOSIS — Z7689 Persons encountering health services in other specified circumstances: Secondary | ICD-10-CM | POA: Diagnosis not present

## 2021-06-05 DIAGNOSIS — Z79899 Other long term (current) drug therapy: Secondary | ICD-10-CM | POA: Diagnosis not present

## 2021-06-05 DIAGNOSIS — M25562 Pain in left knee: Secondary | ICD-10-CM | POA: Diagnosis not present

## 2021-06-06 DIAGNOSIS — E119 Type 2 diabetes mellitus without complications: Secondary | ICD-10-CM | POA: Diagnosis not present

## 2021-06-07 ENCOUNTER — Telehealth: Payer: Self-pay | Admitting: Radiology

## 2021-06-07 DIAGNOSIS — E119 Type 2 diabetes mellitus without complications: Secondary | ICD-10-CM | POA: Diagnosis not present

## 2021-06-07 NOTE — Telephone Encounter (Signed)
Pam messaged again, she did find the 2 days and we are good to go

## 2021-06-07 NOTE — Telephone Encounter (Signed)
She was told by cardiology to hold Xarelto for 2 days  Surgery is tomorrow will have to [facility]

## 2021-06-07 NOTE — H&P (Signed)
Outpatient admission history and physical for arthroscopy left knee   Chief Complaint  Patient presents with   Knee Pain      Left/ review MRI           Encounter Diagnoses  Name Primary?   Chronic pain of left knee Yes   Derangement of posterior horn of medial meniscus of left knee     Lateral meniscus derangement, left     Primary localized osteoarthritis of knee-left      Rhonda Hickman is 65 years old she has multiple medical problems she had an MRI of her left knee it shows that she has torn medial meniscus torn lateral meniscus osteoarthritis with delamination of cartilage in the patellofemoral joint and medial femoral condyle  She is on Xarelto  I do think that her symptoms and physical exam and MRI and x-ray findings warrant arthroscopic surgery of the left knee  Her x-ray shows that she actually has well-preserved joint spaces so the cartilage abnormality seen on MRI are surfaced changes without joint space narrowing although she does have osteophyte on x-ray  She had a cardiology consult a medical consult and management of her Xarelto   Review of systems no unexpected weight loss or weight gain no corrective lenses no headache no chest pain no shortness of breath no heartburn no skin changes joint pain swelling yes  Unsteady gait  Nervousness anxiety none  Easy bleeding at times on Xarelto.  Excessive thirst urination none.  No allergic food reactions.  General appearance: Normal normal grooming hygiene large  Oriented x3 normal  Mood affect normal  Gait favors her left leg  Cardiovascular no edema  Lymph nodes negative  Sensation normal  Pathologic reflexes none  Balance coordination normal  Left knee exam tenderness lateral compartment decreased range of motion ligaments stable muscle tone normal skin intact small effusion  Diagnosis arthritis lateral and medial meniscal tears  The procedure has been fully reviewed with the patient; The risks and benefits  of surgery have been discussed and explained and understood. Alternative treatment has also been reviewed, questions were encouraged and answered. The postoperative plan is also been reviewed.  Our plan is to do an arthroscopy of the left knee with medial lateral meniscectomy  We discussed the fact that it will take 6 weeks to recover she should expect 80% pain relief  I personally reviewed the MRI my interpretation is that the MRI does show the medial and lateral meniscal tears as well as the cartilage loss in the medial and patellofemoral compartment          Electronically signed by Carole Civil, MD at 04/15/2021 12:29 PM

## 2021-06-07 NOTE — Telephone Encounter (Signed)
-----   Message from Wilmer Floor, RN sent at 06/02/2021  1:52 PM EST ----- Regarding: Patient was not told to hold Xarelto Hey Dr Aline Brochure,  Ms Morais is for knee arthroscopy on 06/08/2021.   She was not told to hold her Xarelto by anyone.   I see a note from you to have cardiology consult to hold xarelto but I do not see any other notes.  Can you call her at home regarding Xarelto  [phone]  Thanks   Rosalyn Gess RN

## 2021-06-08 ENCOUNTER — Ambulatory Visit (HOSPITAL_COMMUNITY): Payer: Medicaid Other | Admitting: Anesthesiology

## 2021-06-08 ENCOUNTER — Other Ambulatory Visit: Payer: Self-pay

## 2021-06-08 ENCOUNTER — Other Ambulatory Visit: Payer: Self-pay | Admitting: Orthopedic Surgery

## 2021-06-08 ENCOUNTER — Ambulatory Visit (HOSPITAL_COMMUNITY)
Admission: RE | Admit: 2021-06-08 | Discharge: 2021-06-08 | Disposition: A | Discharge status: Home / Self Care | Payer: Medicaid Other | Attending: Orthopedic Surgery | Admitting: Orthopedic Surgery

## 2021-06-08 ENCOUNTER — Encounter (HOSPITAL_COMMUNITY): Payer: Self-pay | Admitting: Orthopedic Surgery

## 2021-06-08 ENCOUNTER — Encounter: Payer: Self-pay | Admitting: Orthopedic Surgery

## 2021-06-08 ENCOUNTER — Encounter (HOSPITAL_COMMUNITY)
Admission: RE | Disposition: A | Discharge status: Home / Self Care | Payer: Self-pay | Source: Home / Self Care | Attending: Orthopedic Surgery

## 2021-06-08 DIAGNOSIS — M94262 Chondromalacia, left knee: Secondary | ICD-10-CM | POA: Diagnosis not present

## 2021-06-08 DIAGNOSIS — X58XXXA Exposure to other specified factors, initial encounter: Secondary | ICD-10-CM | POA: Diagnosis not present

## 2021-06-08 DIAGNOSIS — E119 Type 2 diabetes mellitus without complications: Secondary | ICD-10-CM | POA: Insufficient documentation

## 2021-06-08 DIAGNOSIS — Z7901 Long term (current) use of anticoagulants: Secondary | ICD-10-CM | POA: Insufficient documentation

## 2021-06-08 DIAGNOSIS — K219 Gastro-esophageal reflux disease without esophagitis: Secondary | ICD-10-CM | POA: Diagnosis not present

## 2021-06-08 DIAGNOSIS — S83209A Unspecified tear of unspecified meniscus, current injury, unspecified knee, initial encounter: Secondary | ICD-10-CM

## 2021-06-08 DIAGNOSIS — Z8673 Personal history of transient ischemic attack (TIA), and cerebral infarction without residual deficits: Secondary | ICD-10-CM | POA: Insufficient documentation

## 2021-06-08 DIAGNOSIS — G709 Myoneural disorder, unspecified: Secondary | ICD-10-CM | POA: Diagnosis not present

## 2021-06-08 DIAGNOSIS — E1165 Type 2 diabetes mellitus with hyperglycemia: Secondary | ICD-10-CM | POA: Diagnosis not present

## 2021-06-08 DIAGNOSIS — M1712 Unilateral primary osteoarthritis, left knee: Secondary | ICD-10-CM | POA: Insufficient documentation

## 2021-06-08 DIAGNOSIS — M23204 Derangement of unspecified medial meniscus due to old tear or injury, left knee: Secondary | ICD-10-CM | POA: Diagnosis not present

## 2021-06-08 DIAGNOSIS — D649 Anemia, unspecified: Secondary | ICD-10-CM | POA: Insufficient documentation

## 2021-06-08 DIAGNOSIS — F17200 Nicotine dependence, unspecified, uncomplicated: Secondary | ICD-10-CM | POA: Diagnosis not present

## 2021-06-08 DIAGNOSIS — M23201 Derangement of unspecified lateral meniscus due to old tear or injury, left knee: Secondary | ICD-10-CM | POA: Diagnosis not present

## 2021-06-08 DIAGNOSIS — S83242A Other tear of medial meniscus, current injury, left knee, initial encounter: Secondary | ICD-10-CM | POA: Insufficient documentation

## 2021-06-08 DIAGNOSIS — S83282A Other tear of lateral meniscus, current injury, left knee, initial encounter: Secondary | ICD-10-CM | POA: Diagnosis not present

## 2021-06-08 DIAGNOSIS — M659 Synovitis and tenosynovitis, unspecified: Secondary | ICD-10-CM | POA: Insufficient documentation

## 2021-06-08 DIAGNOSIS — J449 Chronic obstructive pulmonary disease, unspecified: Secondary | ICD-10-CM | POA: Insufficient documentation

## 2021-06-08 HISTORY — PX: KNEE ARTHROSCOPY WITH LATERAL MENISECTOMY: SHX6193

## 2021-06-08 LAB — RAPID URINE DRUG SCREEN, HOSP PERFORMED
Amphetamines: NOT DETECTED
Barbiturates: NOT DETECTED
Benzodiazepines: NOT DETECTED
Cocaine: NOT DETECTED
Opiates: POSITIVE — AB
Tetrahydrocannabinol: NOT DETECTED

## 2021-06-08 LAB — GLUCOSE, CAPILLARY
Glucose-Capillary: 166 mg/dL — ABNORMAL HIGH (ref 70–99)
Glucose-Capillary: 170 mg/dL — ABNORMAL HIGH (ref 70–99)

## 2021-06-08 SURGERY — ARTHROSCOPY, KNEE, WITH LATERAL MENISCECTOMY
Anesthesia: General | Site: Knee | Laterality: Left

## 2021-06-08 MED ORDER — FENTANYL CITRATE (PF) 100 MCG/2ML IJ SOLN
INTRAMUSCULAR | Status: DC | PRN
  Administered 2021-06-08 (×2): 25 ug via INTRAVENOUS
  Administered 2021-06-08: 50 ug via INTRAVENOUS

## 2021-06-08 MED ORDER — OXYCODONE-ACETAMINOPHEN 7.5-325 MG PO TABS: 1.0000 | ORAL_TABLET | ORAL | 0 refills | Status: DC | PRN

## 2021-06-08 MED ORDER — DEXMEDETOMIDINE (PRECEDEX) IN NS 20 MCG/5ML (4 MCG/ML) IV SYRINGE
PREFILLED_SYRINGE | INTRAVENOUS | Status: AC
  Filled 2021-06-08: qty 5

## 2021-06-08 MED ORDER — KETOROLAC TROMETHAMINE 30 MG/ML IJ SOLN
INTRAMUSCULAR | Status: AC
  Filled 2021-06-08: qty 1

## 2021-06-08 MED ORDER — PROPOFOL 10 MG/ML IV BOLUS
INTRAVENOUS | Status: DC | PRN
  Administered 2021-06-08: 200 mg via INTRAVENOUS

## 2021-06-08 MED ORDER — OXYCODONE-ACETAMINOPHEN 7.5-325 MG PO TABS: 1.0000 | ORAL_TABLET | ORAL | 0 refills | Status: AC | PRN

## 2021-06-08 MED ORDER — EPHEDRINE 5 MG/ML INJ
INTRAVENOUS | Status: AC
  Filled 2021-06-08: qty 5

## 2021-06-08 MED ORDER — IPRATROPIUM-ALBUTEROL 0.5-2.5 (3) MG/3ML IN SOLN
RESPIRATORY_TRACT | Status: AC
  Filled 2021-06-08: qty 3

## 2021-06-08 MED ORDER — ONDANSETRON HCL 4 MG/2ML IJ SOLN: 4.0000 mg | Freq: Once | INTRAMUSCULAR | Status: DC | PRN

## 2021-06-08 MED ORDER — PHENYLEPHRINE HCL (PRESSORS) 10 MG/ML IV SOLN
INTRAVENOUS | Status: DC | PRN
  Administered 2021-06-08: 80 ug via INTRAVENOUS
  Administered 2021-06-08 (×5): 40 ug via INTRAVENOUS

## 2021-06-08 MED ORDER — FENTANYL CITRATE PF 50 MCG/ML IJ SOSY: 25.0000 ug | PREFILLED_SYRINGE | INTRAMUSCULAR | Status: DC | PRN

## 2021-06-08 MED ORDER — ORAL CARE MOUTH RINSE: 15.0000 mL | Freq: Once | OROMUCOSAL | Status: AC

## 2021-06-08 MED ORDER — MIDAZOLAM HCL 2 MG/2ML IJ SOLN
INTRAMUSCULAR | Status: DC | PRN
  Administered 2021-06-08: 2 mg via INTRAVENOUS

## 2021-06-08 MED ORDER — IPRATROPIUM-ALBUTEROL 0.5-2.5 (3) MG/3ML IN SOLN
3.0000 mL | RESPIRATORY_TRACT | Status: DC
  Administered 2021-06-08: 3 mL via RESPIRATORY_TRACT

## 2021-06-08 MED ORDER — CEFAZOLIN IN SODIUM CHLORIDE 3-0.9 GM/100ML-% IV SOLN
3.0000 g | INTRAVENOUS | Status: AC
  Administered 2021-06-08: 3 g via INTRAVENOUS

## 2021-06-08 MED ORDER — OXYCODONE HCL 5 MG PO TABS
ORAL_TABLET | ORAL | Status: AC
  Filled 2021-06-08: qty 1

## 2021-06-08 MED ORDER — SODIUM CHLORIDE 0.9 % IR SOLN
Status: DC | PRN
  Administered 2021-06-08 (×4): 3000 mL

## 2021-06-08 MED ORDER — DEXAMETHASONE SODIUM PHOSPHATE 10 MG/ML IJ SOLN
INTRAMUSCULAR | Status: DC | PRN
  Administered 2021-06-08: 10 mg via INTRAVENOUS

## 2021-06-08 MED ORDER — BUPIVACAINE-EPINEPHRINE (PF) 0.5% -1:200000 IJ SOLN
INTRAMUSCULAR | Status: AC
  Filled 2021-06-08: qty 30

## 2021-06-08 MED ORDER — ONDANSETRON HCL 4 MG/2ML IJ SOLN
INTRAMUSCULAR | Status: AC
  Filled 2021-06-08: qty 2

## 2021-06-08 MED ORDER — OXYCODONE HCL 5 MG PO TABS
5.0000 mg | ORAL_TABLET | Freq: Once | ORAL | Status: AC
  Administered 2021-06-08: 5 mg via ORAL

## 2021-06-08 MED ORDER — MIDAZOLAM HCL 2 MG/2ML IJ SOLN
INTRAMUSCULAR | Status: AC
  Filled 2021-06-08: qty 2

## 2021-06-08 MED ORDER — ROPIVACAINE HCL 5 MG/ML IJ SOLN: INTRAMUSCULAR | Status: DC | PRN

## 2021-06-08 MED ORDER — DEXAMETHASONE SODIUM PHOSPHATE 10 MG/ML IJ SOLN
INTRAMUSCULAR | Status: AC
  Filled 2021-06-08: qty 1

## 2021-06-08 MED ORDER — KETOROLAC TROMETHAMINE 30 MG/ML IJ SOLN
INTRAMUSCULAR | Status: DC | PRN
  Administered 2021-06-08: 30 mg via INTRAVENOUS

## 2021-06-08 MED ORDER — BUPIVACAINE-EPINEPHRINE (PF) 0.5% -1:200000 IJ SOLN
INTRAMUSCULAR | Status: DC | PRN
  Administered 2021-06-08: 30 mL

## 2021-06-08 MED ORDER — EPINEPHRINE PF 1 MG/ML IJ SOLN
INTRAMUSCULAR | Status: AC
  Filled 2021-06-08: qty 6

## 2021-06-08 MED ORDER — PHENYLEPHRINE 40 MCG/ML (10ML) SYRINGE FOR IV PUSH (FOR BLOOD PRESSURE SUPPORT)
PREFILLED_SYRINGE | INTRAVENOUS | Status: AC
  Filled 2021-06-08: qty 10

## 2021-06-08 MED ORDER — LACTATED RINGERS IV SOLN: INTRAVENOUS | Status: DC

## 2021-06-08 MED ORDER — ONDANSETRON HCL 4 MG/2ML IJ SOLN
4.0000 mg | Freq: Once | INTRAMUSCULAR | Status: AC
  Administered 2021-06-08: 4 mg via INTRAVENOUS

## 2021-06-08 MED ORDER — CHLORHEXIDINE GLUCONATE 0.12 % MT SOLN
15.0000 mL | Freq: Once | OROMUCOSAL | Status: AC
  Administered 2021-06-08: 15 mL via OROMUCOSAL

## 2021-06-08 MED ORDER — ONDANSETRON HCL 4 MG/2ML IJ SOLN
INTRAMUSCULAR | Status: DC | PRN
  Administered 2021-06-08: 4 mg via INTRAVENOUS

## 2021-06-08 MED ORDER — EPHEDRINE SULFATE-NACL 50-0.9 MG/10ML-% IV SOSY
PREFILLED_SYRINGE | INTRAVENOUS | Status: DC | PRN
  Administered 2021-06-08: 5 mg via INTRAVENOUS

## 2021-06-08 MED ORDER — FENTANYL CITRATE (PF) 100 MCG/2ML IJ SOLN
INTRAMUSCULAR | Status: AC
  Filled 2021-06-08: qty 2

## 2021-06-08 SURGICAL SUPPLY — 42 items
ABLATOR ASPIRATE 50D MULTI-PRT (SURGICAL WAND) ×1 IMPLANT
APL PRP STRL LF DISP 70% ISPRP (MISCELLANEOUS) ×1
BLADE SHAVER TORPEDO 4X13 (MISCELLANEOUS) ×1 IMPLANT
BLADE SURG SZ11 CARB STEEL (BLADE) ×2 IMPLANT
BNDG CMPR STD VLCR NS LF 5.8X6 (GAUZE/BANDAGES/DRESSINGS) ×1
BNDG ELASTIC 6X5.8 VLCR NS LF (GAUZE/BANDAGES/DRESSINGS) ×2 IMPLANT
CHLORAPREP W/TINT 26 (MISCELLANEOUS) ×3 IMPLANT
CLOTH BEACON ORANGE TIMEOUT ST (SAFETY) ×2 IMPLANT
COOLER ICEMAN CLASSIC (MISCELLANEOUS) ×2 IMPLANT
DISSECTOR 4.0MMX13CM CVD (MISCELLANEOUS) ×2 IMPLANT
DRAPE HALF SHEET 40X57 (DRAPES) ×2 IMPLANT
GAUZE 4X4 16PLY ~~LOC~~+RFID DBL (SPONGE) ×2 IMPLANT
GAUZE SPONGE 4X4 12PLY STRL (GAUZE/BANDAGES/DRESSINGS) ×2 IMPLANT
GAUZE XEROFORM 5X9 LF (GAUZE/BANDAGES/DRESSINGS) ×2 IMPLANT
GLOVE SS N UNI LF 8.5 STRL (GLOVE) ×2 IMPLANT
GLOVE SURG POLYISO LF SZ8 (GLOVE) ×2 IMPLANT
GLOVE SURG UNDER POLY LF SZ7 (GLOVE) ×2 IMPLANT
GOWN STRL REUS W/TWL LRG LVL3 (GOWN DISPOSABLE) ×2 IMPLANT
GOWN STRL REUS W/TWL XL LVL3 (GOWN DISPOSABLE) ×2 IMPLANT
IV NS IRRIG 3000ML ARTHROMATIC (IV SOLUTION) ×10 IMPLANT
KIT BLADEGUARD II DBL (SET/KITS/TRAYS/PACK) ×2 IMPLANT
KIT TURNOVER CYSTO (KITS) ×2 IMPLANT
MANIFOLD NEPTUNE II (INSTRUMENTS) ×2 IMPLANT
MARKER SKIN DUAL TIP RULER LAB (MISCELLANEOUS) ×2 IMPLANT
NDL HYPO 21X1.5 SAFETY (NEEDLE) ×1 IMPLANT
NDL SPNL 18GX3.5 QUINCKE PK (NEEDLE) ×1 IMPLANT
NEEDLE HYPO 18GX1.5 BLUNT FILL (NEEDLE) ×6 IMPLANT
NEEDLE HYPO 21X1.5 SAFETY (NEEDLE) ×2 IMPLANT
NEEDLE SPNL 18GX3.5 QUINCKE PK (NEEDLE) ×2 IMPLANT
PACK ARTHRO LIMB DRAPE STRL (MISCELLANEOUS) ×2 IMPLANT
PAD ABD 5X9 TENDERSORB (GAUZE/BANDAGES/DRESSINGS) ×2 IMPLANT
PAD ARMBOARD 7.5X6 YLW CONV (MISCELLANEOUS) ×2 IMPLANT
PAD COLD SHLDR WRAP-ON (PAD) ×1 IMPLANT
PAD FOR LEG HOLDER (MISCELLANEOUS) ×2 IMPLANT
PADDING CAST COTTON 6X4 STRL (CAST SUPPLIES) ×2 IMPLANT
SET ARTHROSCOPY INST (INSTRUMENTS) ×2 IMPLANT
SET BASIN LINEN APH (SET/KITS/TRAYS/PACK) ×2 IMPLANT
SUT ETHILON 3 0 FSL (SUTURE) ×2 IMPLANT
SYR 10ML LL (SYRINGE) ×1 IMPLANT
SYR 30ML LL (SYRINGE) ×3 IMPLANT
TUBE CONNECTING 12X1/4 (SUCTIONS) ×5 IMPLANT
TUBING IN/OUT FLOW W/MAIN PUMP (TUBING) ×2 IMPLANT

## 2021-06-08 NOTE — Op Note (Signed)
06/08/2021  12:29 PM  PATIENT:  Rhonda Hickman  65 y.o. female  PRE-OPERATIVE DIAGNOSIS:  Torn medial and lateral meniscus left knee  POST-OPERATIVE DIAGNOSIS:  Torn medial and lateral meniscus left knee  PROCEDURE:  Procedure(s): KNEE ARTHROSCOPY WITH LATERAL MENISCECTOMY AND MEDIAL MENISCECTOMY (Left)  Findings:  Medial compartment :  Irregular surfaces of the medial femoral condyle with multiple areas of cartilage defects that had filled in with fibrocartilage  Horizontal tear posterior horn medial meniscus  Notch:  ACL PCL intact  Lateral compartment:  Horizontal tear mid body lateral meniscus Normal articular cartilage lateral femoral condyle, diffuse grade 2 cartilage chondromalacia lateral tibial plateau  Patellofemoral joint: Grade 2 lesion trochlea grade 1 lesion medial and lateral facet   Joint:  Significant areas of synovitis extensive  Details of the procedure: The patient was identified in the preoperative holding area using 2 approved identification mechanisms. The chart was reviewed and updated. The surgical site was confirmed as LEFT  knee and marked with an indelible marker.  The patient was taken to the operating room for anesthesia. After successful  GENERAL anesthesia, ANCEF  was used as IV antibiotics.  The patient was placed in the supine position with the (LEFT) the operative extremity in an arthroscopic leg holder and the opposite extremity in a padded leg holder.  The timeout was executed.  A lateral portal was established with an 11 blade and the scope was introduced into the joint. A diagnostic arthroscopy was performed in circumferential manner examining the entire knee joint. A medial portal was established and the diagnostic arthroscopy was repeated using a probe to palpate intra-articular structures as they were encountered.    The MEDIAL  meniscus was torn in the posterior horn, this was a horizontal cleavage type tear, it was resected  using a duckbill forceps, because the inferior leaf was more attached to the root more of it was preserved.  Both horizontal portions were resected back more superior resection and inferior.  The meniscal fragments were removed with a motorized shaver. The meniscus was balanced with a combination of a motorized shaver and a 50 ArthroCare wand until a stable rim was obtained.  The LATERAL meniscus was also found to have a horizontal tear at the body.  This was resected on the superior and inferior leaflets back to a stable rim imbalance with a motorized shaver  Synovial tissue was resected primarily to improve visualization for the procedure  The arthroscopic pump was placed on the wash mode and any excess debris was removed from the joint using suction.  60 cc of Marcaine with epinephrine was injected through the arthroscope.  The portals were closed with 3-0 nylon suture.  A sterile bandage, Ace wrap and Cryo/Cuff was placed and the Cryo/Cuff was activated. The patient was taken to the recovery room in stable condition.  PHYSICIAN ASSISTANT: no  ASSISTANTS: none   ANESTHESIA:   General  EBL:  none   BLOOD ADMINISTERED:none  DRAINS: none   LOCAL MEDICATIONS USED:  MARCAINE     SPECIMEN:  No Specimen  DISPOSITION OF SPECIMEN:  N/A  COUNTS:  YES   DICTATION: .Dragon Dictation  PLAN OF CARE: Discharge to home after PACU  PATIENT DISPOSITION:  PACU - hemodynamically stable.   Delay start of Pharmacological VTE agent (>24hrs) due to surgical blood loss or risk of bleeding: not applicable   SURGEON:  Surgeon(s) and Role:    Carole Civil, MD - Primary  PHYSICIAN ASSISTANT:   ASSISTANTS:  none   ANESTHESIA:   general  EBL:  10 mL   BLOOD ADMINISTERED:none  DRAINS: none   LOCAL MEDICATIONS USED:  MARCAINE     SPECIMEN:  No Specimen  DISPOSITION OF SPECIMEN:  N/A  COUNTS:  YES  TOURNIQUET:  * Missing tourniquet times found for documented tourniquets in  log: [id number] *  DICTATION: .Viviann Spare Dictation  PLAN OF CARE: Discharge to home after PACU  PATIENT DISPOSITION:  PACU - hemodynamically stable.   Delay start of Pharmacological VTE agent (>24hrs) due to surgical blood loss or risk of bleeding: yes

## 2021-06-08 NOTE — Brief Op Note (Addendum)
06/08/2021  12:29 PM  PATIENT:  Rhonda Hickman  65 y.o. female  PRE-OPERATIVE DIAGNOSIS:  Torn medial and lateral meniscus left knee  POST-OPERATIVE DIAGNOSIS:  Torn medial and lateral meniscus left knee  PROCEDURE:  Procedure(s): KNEE ARTHROSCOPY WITH LATERAL MENISCECTOMY AND MEDIAL MENISCECTOMY (Left)  Findings:  Medial compartment :  Irregular surfaces of the medial femoral condyle with multiple areas of cartilage defects that had filled in with fibrocartilage  Horizontal tear posterior horn medial meniscus  Notch:  ACL PCL intact  Lateral compartment:  Horizontal tear mid body lateral meniscus Normal articular cartilage lateral femoral condyle, diffuse grade 2 cartilage chondromalacia lateral tibial plateau  Patellofemoral joint: Grade 2 lesion trochlea grade 1 lesion medial and lateral facet   Joint:  Significant areas of synovitis extensive  Details of the procedure: The patient was identified in the preoperative holding area using 2 approved identification mechanisms. The chart was reviewed and updated. The surgical site was confirmed as LEFT  knee and marked with an indelible marker.  The patient was taken to the operating room for anesthesia. After successful  GENERAL anesthesia, ANCEF  was used as IV antibiotics.  The patient was placed in the supine position with the (LEFT) the operative extremity in an arthroscopic leg holder and the opposite extremity in a padded leg holder.  The timeout was executed.  A lateral portal was established with an 11 blade and the scope was introduced into the joint. A diagnostic arthroscopy was performed in circumferential manner examining the entire knee joint. A medial portal was established and the diagnostic arthroscopy was repeated using a probe to palpate intra-articular structures as they were encountered.    The MEDIAL  meniscus was torn in the posterior horn, this was a horizontal cleavage type tear, it was resected  using a duckbill forceps, because the inferior leaf was more attached to the root more of it was preserved.  Both horizontal portions were resected back more superior resection and inferior.  The meniscal fragments were removed with a motorized shaver. The meniscus was balanced with a combination of a motorized shaver and a 50 ArthroCare wand until a stable rim was obtained.  The LATERAL meniscus was also found to have a horizontal tear at the body.  This was resected on the superior and inferior leaflets back to a stable rim imbalance with a motorized shaver  Synovial tissue was resected primarily to improve visualization for the procedure  The arthroscopic pump was placed on the wash mode and any excess debris was removed from the joint using suction.  60 cc of Marcaine with epinephrine was injected through the arthroscope.  The portals were closed with 3-0 nylon suture.  A sterile bandage, Ace wrap and Cryo/Cuff was placed and the Cryo/Cuff was activated. The patient was taken to the recovery room in stable condition.  PHYSICIAN ASSISTANT: no  ASSISTANTS: none   ANESTHESIA:   General  EBL:  none   BLOOD ADMINISTERED:none  DRAINS: none   LOCAL MEDICATIONS USED:  MARCAINE     SPECIMEN:  No Specimen  DISPOSITION OF SPECIMEN:  N/A  COUNTS:  YES   DICTATION: .Dragon Dictation  PLAN OF CARE: Discharge to home after PACU  PATIENT DISPOSITION:  PACU - hemodynamically stable.   Delay start of Pharmacological VTE agent (>24hrs) due to surgical blood loss or risk of bleeding: not applicable   SURGEON:  Surgeon(s) and Role:    Carole Civil, MD - Primary  PHYSICIAN ASSISTANT:   ASSISTANTS:  none   ANESTHESIA:   general  EBL:  10 mL   BLOOD ADMINISTERED:none  DRAINS: none   LOCAL MEDICATIONS USED:  MARCAINE     SPECIMEN:  No Specimen  DISPOSITION OF SPECIMEN:  N/A  COUNTS:  YES  TOURNIQUET:  * Missing tourniquet times found for documented tourniquets in  log: [id number] *  DICTATION: .Viviann Spare Dictation  PLAN OF CARE: Discharge to home after PACU  PATIENT DISPOSITION:  PACU - hemodynamically stable.   Delay start of Pharmacological VTE agent (>24hrs) due to surgical blood loss or risk of bleeding: yes

## 2021-06-08 NOTE — Anesthesia Preprocedure Evaluation (Signed)
Anesthesia Evaluation  Patient identified by MRN, date of birth, ID band Patient awake    Reviewed: Allergy & Precautions, H&P , NPO status , Patient's Chart, lab work & pertinent test results, reviewed documented beta blocker date and time   Airway Mallampati: II  TM Distance: >3 FB Neck ROM: full    Dental no notable dental hx.    Pulmonary asthma , COPD, Current Smoker,    Pulmonary exam normal breath sounds clear to auscultation       Cardiovascular Exercise Tolerance: Good hypertension,  Rhythm:regular Rate:Normal     Neuro/Psych  Headaches, TIA Neuromuscular disease CVA negative psych ROS   GI/Hepatic GERD  Medicated,(+)     substance abuse  cocaine use,   Endo/Other  negative endocrine ROSdiabetes, Poorly Controlled, Type 2  Renal/GU negative Renal ROS  negative genitourinary   Musculoskeletal   Abdominal   Peds  Hematology  (+) Blood dyscrasia, anemia ,   Anesthesia Other Findings   Reproductive/Obstetrics negative OB ROS                             Anesthesia Physical Anesthesia Plan  ASA: 3  Anesthesia Plan: General and General LMA   Post-op Pain Management:    Induction:   PONV Risk Score and Plan: Ondansetron  Airway Management Planned:   Additional Equipment:   Intra-op Plan:   Post-operative Plan:   Informed Consent: I have reviewed the patients History and Physical, chart, labs and discussed the procedure including the risks, benefits and alternatives for the proposed anesthesia with the patient or authorized representative who has indicated his/her understanding and acceptance.     Dental Advisory Given  Plan Discussed with: CRNA  Anesthesia Plan Comments:         Anesthesia Quick Evaluation

## 2021-06-08 NOTE — Anesthesia Procedure Notes (Signed)
Procedure Name: LMA Insertion Date/Time: 06/08/2021 10:55 AM Performed by: Maude Leriche, CRNA Pre-anesthesia Checklist: Patient identified, Emergency Drugs available, Suction available, Patient being monitored and Timeout performed Patient Re-evaluated:Patient Re-evaluated prior to induction Oxygen Delivery Method: Circle system utilized Preoxygenation: Pre-oxygenation with 100% oxygen Induction Type: IV induction Ventilation: Mask ventilation without difficulty LMA: LMA inserted LMA Size: 4.0 Number of attempts: 1 Tube secured with: Tape Dental Injury: Teeth and Oropharynx as per pre-operative assessment

## 2021-06-08 NOTE — Interval H&P Note (Signed)
History and Physical Interval Note:  06/08/2021 10:36 AM  Rhonda Hickman  has presented today for surgery, with the diagnosis of Torn medial and lateral meniscus left knee.  The various methods of treatment have been discussed with the patient and family. After consideration of risks, benefits and other options for treatment, the patient has consented to  Procedure(s): KNEE ARTHROSCOPY WITH LATERAL MENISCECTOMY AND MEDIAL MENISCECTOMY (Left) as a surgical intervention.  The patient's history has been reviewed, patient examined, no change in status, stable for surgery.  I have reviewed the patient's chart and labs.  Questions were answered to the patient's satisfaction.     Arther Abbott

## 2021-06-08 NOTE — Anesthesia Procedure Notes (Addendum)
Anesthesia Regional Block: Adductor canal block  Laterality: N/A          Narrative:   Performed by: Personally   Additional Notes: Note entered on wrong patient.  No block was performed.

## 2021-06-08 NOTE — Transfer of Care (Signed)
Immediate Anesthesia Transfer of Care Note  Patient: Rhonda Hickman  Procedure(s) Performed: KNEE ARTHROSCOPY WITH LATERAL MENISCECTOMY AND MEDIAL MENISCECTOMY (Left: Knee)  Patient Location: PACU  Anesthesia Type:General  Level of Consciousness: drowsy, patient cooperative and responds to stimulation  Airway & Oxygen Therapy: Patient Spontanous Breathing and Patient connected to face mask oxygen  Post-op Assessment: Report given to RN, Post -op Vital signs reviewed and stable and Patient moving all extremities X 4  Post vital signs: stable  Last Vitals:  Vitals Value Taken Time  BP 153/96 06/08/21 1231  Temp 97.7   Pulse 58 06/08/21 1234  Resp 9 06/08/21 1234  SpO2 99 % 06/08/21 1234  Vitals shown include unvalidated device data.  Last Pain:  Vitals:   06/08/21 0909  TempSrc: Oral  PainSc: 0-No pain      Patients Stated Pain Goal: 5 (59/45/85 [id number])  Complications: No notable events documented.

## 2021-06-08 NOTE — Anesthesia Postprocedure Evaluation (Signed)
Anesthesia Post Note  Patient: Maat Kafer  Procedure(s) Performed: KNEE ARTHROSCOPY WITH LATERAL MENISCECTOMY AND MEDIAL MENISCECTOMY (Left: Knee)  Patient location during evaluation: Phase II Anesthesia Type: General Level of consciousness: awake Pain management: pain level controlled Vital Signs Assessment: post-procedure vital signs reviewed and stable Respiratory status: spontaneous breathing and respiratory function stable Cardiovascular status: blood pressure returned to baseline and stable Postop Assessment: no headache and no apparent nausea or vomiting Anesthetic complications: no Comments: Late entry   No notable events documented.   Last Vitals:  Vitals:   06/08/21 1300 06/08/21 1320  BP: (!) 149/53 118/65  Pulse: (!) 59 (!) 57  Resp: 15 18  Temp:  36.6 C  SpO2: 100% 99%    Last Pain:  Vitals:   06/08/21 1320  TempSrc: Oral  PainSc: 0-No pain                 Louann Sjogren

## 2021-06-08 NOTE — Brief Op Note (Signed)
06/08/2021  12:26 PM  PATIENT:  Rhonda Hickman  65 y.o. female  PRE-OPERATIVE DIAGNOSIS:  Torn medial and lateral meniscus left knee  POST-OPERATIVE DIAGNOSIS:  Torn medial and lateral meniscus left knee  PROCEDURE:  Procedure(s): KNEE ARTHROSCOPY WITH LATERAL MENISCECTOMY AND MEDIAL MENISCECTOMY (Left)  SURGEON:  Surgeon(s) and Role:    Carole Civil, MD - Primary  PHYSICIAN ASSISTANT:   ASSISTANTS: none   ANESTHESIA:   general  EBL:  10 mL   BLOOD ADMINISTERED:none  DRAINS: none   LOCAL MEDICATIONS USED:  MARCAINE     SPECIMEN:  No Specimen  DISPOSITION OF SPECIMEN:  N/A  COUNTS:  YES  TOURNIQUET:  * Missing tourniquet times found for documented tourniquets in log: [id number] *  DICTATION: .Viviann Spare Dictation  PLAN OF CARE: Discharge to home after PACU  PATIENT DISPOSITION:  PACU - hemodynamically stable.   Delay start of Pharmacological VTE agent (>24hrs) due to surgical blood loss or risk of bleeding: yes

## 2021-06-09 ENCOUNTER — Encounter (HOSPITAL_COMMUNITY): Payer: Self-pay | Admitting: Orthopedic Surgery

## 2021-06-09 ENCOUNTER — Ambulatory Visit (INDEPENDENT_AMBULATORY_CARE_PROVIDER_SITE_OTHER): Payer: Medicaid Other | Admitting: Primary Care

## 2021-06-09 DIAGNOSIS — E119 Type 2 diabetes mellitus without complications: Secondary | ICD-10-CM | POA: Diagnosis not present

## 2021-06-10 DIAGNOSIS — Z79899 Other long term (current) drug therapy: Secondary | ICD-10-CM | POA: Diagnosis not present

## 2021-06-10 DIAGNOSIS — R6889 Other general symptoms and signs: Secondary | ICD-10-CM | POA: Diagnosis not present

## 2021-06-11 DIAGNOSIS — E785 Hyperlipidemia, unspecified: Secondary | ICD-10-CM | POA: Diagnosis not present

## 2021-06-11 DIAGNOSIS — I10 Essential (primary) hypertension: Secondary | ICD-10-CM | POA: Diagnosis not present

## 2021-06-11 DIAGNOSIS — J449 Chronic obstructive pulmonary disease, unspecified: Secondary | ICD-10-CM | POA: Diagnosis not present

## 2021-06-11 DIAGNOSIS — C3490 Malignant neoplasm of unspecified part of unspecified bronchus or lung: Secondary | ICD-10-CM | POA: Diagnosis not present

## 2021-06-11 DIAGNOSIS — A09 Infectious gastroenteritis and colitis, unspecified: Secondary | ICD-10-CM | POA: Diagnosis not present

## 2021-06-11 DIAGNOSIS — R6889 Other general symptoms and signs: Secondary | ICD-10-CM | POA: Diagnosis not present

## 2021-06-11 NOTE — H&P (Signed)
Chief Complaint  Patient presents with   Knee Pain      Left/ review MRI         Rhonda Hickman is this is a 65 year old female multiple medical problems as presented for left knee pain failed nonoperative treatment had an MRI which showed torn medial and lateral menisci with patellofemoral and medial femoral cartilage damage  She is maintained on Xarelto     Her x-ray shows that she actually has well-preserved joint spaces so the cartilage abnormality seen on MRI are surfaced changes without joint space narrowing although she does have osteophyte on x-ray  She had a cardiology consult a medical consult and management of her Xarelto    Review of systems no unexpected weight loss or weight gain no corrective lenses no headache no chest pain no shortness of breath no heartburn no skin changes joint pain swelling yes  Unsteady gait  Nervousness anxiety none  Easy bleeding at times on Xarelto.  Excessive thirst urination none.  No allergic food reactions.  General appearance: Normal normal grooming hygiene large  Oriented x3 normal  Mood affect normal  Gait favors her left leg  Cardiovascular no edema  Lymph nodes negative  Sensation normal  Pathologic reflexes none  Balance coordination normal  Left knee exam tenderness lateral compartment decreased range of motion ligaments stable muscle tone normal skin intact small effusion  Diagnosis arthritis lateral and medial meniscal tears  The procedure has been fully reviewed with the patient; The risks and benefits of surgery have been discussed and explained and understood. Alternative treatment has also been reviewed, questions were encouraged and answered. The postoperative plan is also been reviewed.  Our plan is to do an arthroscopy of the left knee with medial lateral meniscectomy  We discussed the fact that it will take 6 weeks to recover she should expect 80% pain relief  I personally reviewed the MRI my interpretation is  that the MRI does show the medial and lateral meniscal tears as well as the cartilage loss in the medial and patellofemoral compartment

## 2021-06-12 DIAGNOSIS — A09 Infectious gastroenteritis and colitis, unspecified: Secondary | ICD-10-CM | POA: Diagnosis not present

## 2021-06-12 DIAGNOSIS — I10 Essential (primary) hypertension: Secondary | ICD-10-CM | POA: Diagnosis not present

## 2021-06-12 DIAGNOSIS — E785 Hyperlipidemia, unspecified: Secondary | ICD-10-CM | POA: Diagnosis not present

## 2021-06-12 DIAGNOSIS — C3490 Malignant neoplasm of unspecified part of unspecified bronchus or lung: Secondary | ICD-10-CM | POA: Diagnosis not present

## 2021-06-12 DIAGNOSIS — J449 Chronic obstructive pulmonary disease, unspecified: Secondary | ICD-10-CM | POA: Diagnosis not present

## 2021-06-13 DIAGNOSIS — C3490 Malignant neoplasm of unspecified part of unspecified bronchus or lung: Secondary | ICD-10-CM | POA: Diagnosis not present

## 2021-06-13 DIAGNOSIS — A09 Infectious gastroenteritis and colitis, unspecified: Secondary | ICD-10-CM | POA: Diagnosis not present

## 2021-06-13 DIAGNOSIS — E785 Hyperlipidemia, unspecified: Secondary | ICD-10-CM | POA: Diagnosis not present

## 2021-06-13 DIAGNOSIS — I10 Essential (primary) hypertension: Secondary | ICD-10-CM | POA: Diagnosis not present

## 2021-06-13 DIAGNOSIS — J449 Chronic obstructive pulmonary disease, unspecified: Secondary | ICD-10-CM | POA: Diagnosis not present

## 2021-06-15 ENCOUNTER — Other Ambulatory Visit (INDEPENDENT_AMBULATORY_CARE_PROVIDER_SITE_OTHER): Payer: Self-pay | Admitting: Primary Care

## 2021-06-15 DIAGNOSIS — R6889 Other general symptoms and signs: Secondary | ICD-10-CM | POA: Diagnosis not present

## 2021-06-16 ENCOUNTER — Other Ambulatory Visit: Payer: Medicaid Other | Admitting: Adult Health

## 2021-06-16 ENCOUNTER — Ambulatory Visit (INDEPENDENT_AMBULATORY_CARE_PROVIDER_SITE_OTHER): Payer: Medicaid Other | Admitting: Orthopedic Surgery

## 2021-06-16 ENCOUNTER — Other Ambulatory Visit: Payer: Self-pay

## 2021-06-16 ENCOUNTER — Encounter: Payer: Self-pay | Admitting: Orthopedic Surgery

## 2021-06-16 DIAGNOSIS — Z9889 Other specified postprocedural states: Secondary | ICD-10-CM

## 2021-06-16 MED ORDER — HYDROCODONE-ACETAMINOPHEN 7.5-325 MG PO TABS: 1.0000 | ORAL_TABLET | ORAL | 0 refills | Status: AC | PRN

## 2021-06-16 NOTE — Progress Notes (Signed)
POST OP   Chief Complaint  Patient presents with   Post-op Follow-up    Knee scope left 06/08/21    Encounter Diagnosis  Name Primary?   S/P left knee arthroscopy 06/08/21  Yes    PROCEDURE:  PRE-OPERATIVE DIAGNOSIS:  Torn medial and lateral meniscus left knee   POST-OPERATIVE DIAGNOSIS:  Torn medial and lateral meniscus left knee   PROCEDURE:  Procedure(s): KNEE ARTHROSCOPY WITH LATERAL MENISCECTOMY AND MEDIAL MENISCECTOMY (Left)   Findings:  Medial compartment :   Irregular surfaces of the medial femoral condyle with multiple areas of cartilage defects that had filled in with fibrocartilage  Horizontal tear posterior horn medial meniscus   Notch:   ACL PCL intact   Lateral compartment:   Horizontal tear mid body lateral meniscus Normal articular cartilage lateral femoral condyle, diffuse grade 2 cartilage chondromalacia lateral tibial plateau   Patellofemoral joint: Grade 2 lesion trochlea grade 1 lesion medial and lateral facet     Joint:   Significant areas of synovitis extensive  POV # 1  Meds related: Norco 10 mg  Keryl is in stable condition.  The portals are clean the knee is still swollen.  She said she lost her instruction papers and did not ambulate or do the knee exercises as instructed  This is not unusual for this patient  I read her the right act expect that she will partially comply with instruction  Norco decreased from 10 to 7.5 mg continue taper over the next few weeks start strengthening exercises continue range of motion exercises use ice  Weight-bear as tolerated with walker  Follow-up 4 weeks  Meds ordered this encounter  Medications   HYDROcodone-acetaminophen (NORCO) 7.5-325 MG tablet    Sig: Take 1 tablet by mouth every 4 (four) hours as needed for up to 5 days for moderate pain.    Dispense:  30 tablet    Refill:  0

## 2021-06-17 DIAGNOSIS — R6889 Other general symptoms and signs: Secondary | ICD-10-CM | POA: Diagnosis not present

## 2021-06-18 DIAGNOSIS — R6889 Other general symptoms and signs: Secondary | ICD-10-CM | POA: Diagnosis not present

## 2021-06-21 ENCOUNTER — Telehealth: Payer: Self-pay | Admitting: Orthopedic Surgery

## 2021-06-21 NOTE — Telephone Encounter (Signed)
I called no answer no voice mail

## 2021-06-21 NOTE — Telephone Encounter (Signed)
Called again no answer if she calls back try to find out what she needs.

## 2021-06-21 NOTE — Telephone Encounter (Signed)
Patient called; asking for Amy to return her call. I asked if we can include some information in the note - she said it is about her knee. Please call [phone]

## 2021-06-22 DIAGNOSIS — R6889 Other general symptoms and signs: Secondary | ICD-10-CM | POA: Diagnosis not present

## 2021-06-23 ENCOUNTER — Ambulatory Visit: Payer: Medicaid Other | Admitting: Neurology

## 2021-06-23 ENCOUNTER — Telehealth: Payer: Self-pay | Admitting: Orthopedic Surgery

## 2021-06-23 DIAGNOSIS — Z9889 Other specified postprocedural states: Secondary | ICD-10-CM

## 2021-06-23 DIAGNOSIS — G8929 Other chronic pain: Secondary | ICD-10-CM

## 2021-06-23 NOTE — Telephone Encounter (Signed)
Wants to know if Dr Aline Brochure can send in PT orders to [facility] for her, I have done this okay per Dr Lemmie Evens.

## 2021-06-23 NOTE — Progress Notes (Deleted)
No chief complaint on file.   HISTORICAL  Rhonda Hickman is a 65 year old female, seen in request by her cardiologist Dr. [name], and the primary care nurse practitioner [name] P for evaluation of dizziness, related to positional change, I saw her previously in March 2020, she is accompanied by her friend at today's clinical visit.  I reviewed and summarized the referring note.  Past medical history Hypertension Diabetes, insulin-dependent, poorly controlled, A1c 8.3 Hyperlipidemia Bipolar disorder, on Depakote 1000 mg daily, Celexa 40 mg daily, Atrial fibrillation, taking Xarelto  Previous evaluation after she fell on same level ground, passed out, bumped her head on the concrete floor, also described erratic behavior during interview, she also complains of headaches I personally reviewed CT head without contrast September 2021:No acute intracranial abnormality, mild supratentorium small vessel disease  MRI of the brain in January 2021, motion degraded examination, there was no positive DWI lesion, moderate patchy T2/flair hyperdensity at cerebral white matter, consistent with chronic small vessel disease   Echocardiogram, ejection fraction 60 to 65%, no regional wall motion abnormality.   Laboratory evaluations in 2021, CMP showed elevated creatinine 1.4, glucose of 150, CBC showed hemoglobin of 11.9, mild elevated RDW of 15.6, A1c 8.9, was 11.5 in November 2019, has been persistently above 8.5 over the past couple years, lipid panel triglyceride 161, LDL 101, negative HIV, normal B12, TSH  During today's interview, she falling to sleep during conversation, blood pressure lying down 145/78, heart rate of 49; sitting up 127/84, heart rate of 66; standing 160/90, 72 Patient is a poor historian, complains of dizziness all the time, regardless of position, it can happen lying down, sitting up and standing position,  Update December 10, 2020 SS: Here today alone,  mentions intermittent swelling in left hand, now in the right hand several months. Takes gabapentin for neuropathy in feet, ortho sent for therapy for right hand, she didn't go. Living alone, got here by medical transportation. Wants to get knee replacement, too many risk factors right now. Dizziness is better, but if she bends over, does feel dizzy like she may pass out, but nothing recent. Mood not doing well, ups and downs. Claims past due to see psych, Depakote ran out for 1 month. Trying to stop smoking.  In November 2021, extensive laboratory evaluation (drug screen, sed rate, CRP, B12, RPR, ANA, vitamin D, multiple myeloma panel, Depakote) were unremarkable.  MRI of the brain in December 2021 showed mild age-related changes, no acute abnormalities.  In November 2021, A1c was 10.5, 10.7 in February 2022, 9.4 in May 2022  Less than 39% stenosis of bilateral internal carotid artery July 2021  On Xarelto for history AFIB. Saw Cardiology Jan 2022, dizziness unclear cause, orthostatics negative, Zio patch 3 days Nov 2021, unremarkable. Sleep study showed mild OSA, no indication for CPAP at this time.  Update June 23, 2021 SS:   REVIEW OF SYSTEMS: Full 14 system review of systems performed and notable only for as above  See HPI  ALLERGIES: Allergies  Allergen Reactions   Aspirin Nausea Only and Other (See Comments)    Causes stomach pain    HOME MEDICATIONS: Current Outpatient Medications  Medication Sig Dispense Refill   albuterol (VENTOLIN HFA) 108 (90 Base) MCG/ACT inhaler INHALE 2 PUFFS EVERY 6 HOURS AS NEEDED FOR WHEEZING OR SHORTNESS OF BREATH 18 g 1   amLODipine (NORVASC) 10 MG tablet Take 1 tablet (10 mg total) by mouth daily. (Patient taking differently: Take 5 mg  by mouth daily.) 90 tablet 1   budesonide-formoterol (SYMBICORT) 160-4.5 MCG/ACT inhaler Inhale 2 puffs into the lungs 2 (two) times daily. 1 each 3   buPROPion (WELLBUTRIN SR) 150 MG 12 hr tablet Take one tablet  daily for 3 days. Then, take one tablet twice daily for 3 months. 60 tablet 2   calcium-vitamin D (OSCAL WITH D) 500-200 MG-UNIT tablet TAKE (1) TABLET DAILY WITH BREAKFAST. 90 tablet 1   citalopram (CELEXA) 40 MG tablet Take 1 tablet (40 mg total) by mouth daily. 90 tablet 1   divalproex (DEPAKOTE ER) 500 MG 24 hr tablet Take 1-2 tablets by mouth 2 (two) times daily. One tablet in the morning and 2 tablets at bedtime     FEROSUL 325 (65 Fe) MG tablet TAKE (1) TABLET DAILY WITH BREAKFAST. 90 tablet 0   fluticasone (FLONASE) 50 MCG/ACT nasal spray Place 2 sprays into both nostrils daily. 16 g 6   gabapentin (NEURONTIN) 300 MG capsule Take 1 capsule (300 mg total) by mouth every 8 (eight) hours. 90 capsule 0   GNP ULTICARE PEN NEEDLES 32G X 4 MM MISC      insulin glargine (LANTUS SOLOSTAR) 100 UNIT/ML Solostar Pen Inject 20 Units into the skin 2 (two) times daily. 15 mL 3   Insulin Syringe-Needle U-100 (ADVOCATE INSULIN SYRINGE) 29G X 1/2" 0.3 ML MISC Use to inject insulin 5x daily. 200 each 6   JANUVIA 100 MG tablet TAKE 1 TABLET DAILY 90 tablet 1   metFORMIN (GLUCOPHAGE) 1000 MG tablet Take 1 tablet (1,000 mg total) by mouth 2 (two) times daily with a meal. 180 tablet 1   metoprolol tartrate (LOPRESSOR) 50 MG tablet Take 1 tablet (50 mg total) by mouth 2 (two) times daily. 60 tablet 0   nicotine (NICODERM CQ - DOSED IN MG/24 HOURS) 14 mg/24hr patch Place 1 patch (14 mg total) onto the skin daily. 28 patch 2   oxybutynin (DITROPAN) 5 MG tablet TAKE 1 TABLET 2 TO 3 TIMES A DAY FOR BLADDER SPASMS 270 tablet 1   oxyCODONE-acetaminophen (PERCOCET) 7.5-325 MG tablet Take 1 tablet by mouth every 4 (four) hours as needed for severe pain. 30 tablet 0   PEG-KCl-NaCl-NaSulf-Na Asc-C (PLENVU) 140 g SOLR Take 140 g by mouth as directed. Manufacturer's coupon Universal coupon code:BIN: P2366821; GROUP: [id number]; PCN: [id number]; ID: [id number]; PAY NO MORE $50 1 each 0   rosuvastatin (CRESTOR) 10 MG tablet Take 1  tablet (10 mg total) by mouth daily. 90 tablet 2   Vitamin D, Ergocalciferol, (DRISDOL) 1.25 MG (50000 UNIT) CAPS capsule Take 1 capsule (50,000 Units total) by mouth every 7 (seven) days. 32 capsule 0   XARELTO 20 MG TABS tablet TAKE ONE TABLET BY MOUTH WITH SUPPER 30 tablet 2   No current facility-administered medications for this visit.    PAST MEDICAL HISTORY: Past Medical History:  Diagnosis Date   Anemia    Anxiety    Asthma    Atrial fibrillation (HCC)    Bipolar 1 disorder (HCC)    Bulging lumbar disc    Chronic pain of left knee    COPD (chronic obstructive pulmonary disease) (HCC)    CVA (cerebral vascular accident) ([place])    Diabetes mellitus    Neuropathy    TIA (transient ischemic attack)    Vertigo     PAST SURGICAL HISTORY: Past Surgical History:  Procedure Laterality Date   ABDOMINAL HYSTERECTOMY     CARPAL TUNNEL RELEASE Right    CHOLECYSTECTOMY  KNEE ARTHROSCOPY WITH LATERAL MENISECTOMY Left 06/08/2021   Procedure: KNEE ARTHROSCOPY WITH LATERAL MENISCECTOMY AND MEDIAL MENISCECTOMY;  Surgeon: Carole Civil, MD;  Location: [facility];  Service: Orthopedics;  Laterality: Left;   KNEE ARTHROSCOPY WITH MEDIAL MENISECTOMY Right 04/14/2016   Procedure: KNEE ARTHROSCOPY WITH MEDIAL AND LATERAL MENISECTOMY, MICROFRACTURE REPAIR;  Surgeon: Carole Civil, MD;  Location: [facility];  Service: Orthopedics;  Laterality: Right;  lateral menisectomy - needs crutch training    FAMILY HISTORY: Family History  Problem Relation Age of Onset   Diabetes Mother    Heart attack Father     SOCIAL HISTORY: Social History   Socioeconomic History   Marital status: Single    Spouse name: Not on file   Number of children: 0   Years of education: 12   Highest education level: High school graduate  Occupational History   Occupation: Unemployed  Tobacco Use   Smoking status: Some Days    Packs/day: 0.50    Types: Cigarettes   Smokeless tobacco: Never   Tobacco  comments:    tobacco info given  Vaping Use   Vaping Use: Some days  Substance and Sexual Activity   Alcohol use: No   Drug use: No   Sexual activity: Never  Other Topics Concern   Not on file  Social History Narrative   Lives at home alone.   Right-handed.   No caffeine use.   Social Determinants of Health   Financial Resource Strain: Not on file  Food Insecurity: Not on file  Transportation Needs: Not on file  Physical Activity: Not on file  Stress: Not on file  Social Connections: Not on file  Intimate Partner Violence: Not on file   PHYSICAL EXAM   There were no vitals filed for this visit.  There is no height or weight on file to calculate BMI.  PHYSICAL EXAMNIATION:  Physical Exam  General: The patient is alert and cooperative at the time of the examination. Speech is erratic, periods of normal, then slows and slurs, sometimes childlike  Skin: No significant peripheral edema is noted.  Neurologic Exam  Mental status: The patient is alert and oriented x 3 at the time of the examination. The patient has apparent normal recent and remote memory, with an apparently normal attention span and concentration ability.  Cranial nerves: Facial symmetry is present. Extraocular movements are full, pupils equal round and reactive. Visual fields are full.  Motor: The patient has good strength in all 4 extremities.  Sensory examination: Soft touch sensation is symmetric on the face, arms, and legs.  Coordination: The patient has good finger-nose-finger and heel-to-shin bilaterally.  Gait and station: Antalgic gait, slightly wide-based, but independent  Reflexes: Deep tendon reflexes are symmetric.  DIAGNOSTIC DATA (LABS, IMAGING, TESTING) - I reviewed patient records, labs, notes, testing and imaging myself where available.   ASSESSMENT AND PLAN  Margherita Collyer is a 65 y.o. female   1. Unsteady gait, dizziness  -For now, issues seem to be stable, of most concern,  need to get back in with psychiatry to get back on Depakote, mood is up-and-down; extensive work-up has been relatively unrevealing  -Refill gabapentin 300 mg 3 times daily for neuropathy pain lower extremities, seeing ortho, not a candidate for knee replacement due to poorly controlled diabetes, tobacco abuse, also on Xarelto for Afib  -In November 2021, extensive laboratory evaluation (drug screen, sed rate, CRP, B12, RPR, ANA, vitamin D, multiple myeloma panel, Depakote) were unremarkable.  -MRI of  the brain in December 2021 showed mild age-related changes, no acute abnormalities.  -In November 2021, A1c was 10.5, 10.7 in February 2022, 9.4 in May 2022   -Less than 39% stenosis of bilateral internal carotid artery July 2021; on Xarelto history of AFIB  -Have encouraged increased water intake, better control of diabetes, poor control likely contributing to chronic gait issues, but also underlying mood disorder certainly a factor in overall function  -Follow-up in 6 to 8 months or sooner if needed  I spent 32 minutes of face-to-face and non-face-to-face time with patient.  This included previsit chart review, lab review, discussing medications, discussing need to get back in with psychiatry to reestablish on Depakote, reviewing extensive work-up the last several visits, comorbidities likely affecting current health, encouragement for smoking cessation and better glycemic control for overall better management of chronic conditions.  Evangeline Dakin, DNP  [facility] [address] [place], [place] [address] [phone]

## 2021-06-23 NOTE — Telephone Encounter (Signed)
Patient is aware that Amy tried reaching her on 06/21/21 -s aid she is sorry, and is asking if Amy may please call her back about her left knee.

## 2021-06-24 DIAGNOSIS — R6889 Other general symptoms and signs: Secondary | ICD-10-CM | POA: Diagnosis not present

## 2021-06-25 DIAGNOSIS — R6889 Other general symptoms and signs: Secondary | ICD-10-CM | POA: Diagnosis not present

## 2021-06-26 ENCOUNTER — Other Ambulatory Visit (INDEPENDENT_AMBULATORY_CARE_PROVIDER_SITE_OTHER): Payer: Self-pay | Admitting: Family Medicine

## 2021-06-27 NOTE — Telephone Encounter (Signed)
Requested medication (s) are due for refill today: yes  Requested medication (s) are on the active medication list: historical med  Last refill:  04/28/20  Future visit scheduled: yes  Notes to clinic:  historical provider   Requested Prescriptions  Pending Prescriptions Disp Refills   GNP ULTICARE PEN NEEDLES 32G X 4 MM [place] [Pharmacy Med Name: Ulticare Pen Needle 32 gauge x 5/32"] 200 each 6    Sig: USE TO INJECT INSULIN UP TO 5 TIMES A DAY     Endocrinology: Diabetes - Testing Supplies Passed - 06/26/2021  9:04 AM      Passed - Valid encounter within last 12 months    Recent Outpatient Visits           3 months ago Type 2 diabetes mellitus with 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, with long-term current use of insulin ([place])   [place], Michelle P, NP   6 months ago Type 2 diabetes mellitus with 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, with long-term current use of insulin ([place])   [place] [name] P, NP   9 months ago Type 2 diabetes mellitus with 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, with long-term current use of insulin ([place])   [place] [name] P, NP   1 year ago Type 2 diabetes mellitus with 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, with long-term current use of insulin ([place])   [place], [place], NP   1 year ago Dysuria   [place], [place], NP       Future Appointments             In 1 month Oletta Lamas, [place]age, NP [place]   In 3 months [name], MD [place] [facility], [facility]

## 2021-06-28 DIAGNOSIS — R6889 Other general symptoms and signs: Secondary | ICD-10-CM | POA: Diagnosis not present

## 2021-06-28 NOTE — Telephone Encounter (Signed)
Sent to PCP ?

## 2021-06-29 DIAGNOSIS — R6889 Other general symptoms and signs: Secondary | ICD-10-CM | POA: Diagnosis not present

## 2021-06-30 DIAGNOSIS — R6889 Other general symptoms and signs: Secondary | ICD-10-CM | POA: Diagnosis not present

## 2021-07-01 ENCOUNTER — Encounter: Payer: Self-pay | Admitting: Physical Therapy

## 2021-07-01 ENCOUNTER — Other Ambulatory Visit: Payer: Self-pay

## 2021-07-01 ENCOUNTER — Ambulatory Visit: Payer: Medicaid Other | Attending: Orthopedic Surgery | Admitting: Physical Therapy

## 2021-07-01 DIAGNOSIS — M25662 Stiffness of left knee, not elsewhere classified: Secondary | ICD-10-CM | POA: Diagnosis not present

## 2021-07-01 DIAGNOSIS — G8929 Other chronic pain: Secondary | ICD-10-CM | POA: Insufficient documentation

## 2021-07-01 DIAGNOSIS — Z9889 Other specified postprocedural states: Secondary | ICD-10-CM | POA: Diagnosis not present

## 2021-07-01 DIAGNOSIS — M25562 Pain in left knee: Secondary | ICD-10-CM | POA: Insufficient documentation

## 2021-07-01 DIAGNOSIS — M6281 Muscle weakness (generalized): Secondary | ICD-10-CM | POA: Insufficient documentation

## 2021-07-01 DIAGNOSIS — R600 Localized edema: Secondary | ICD-10-CM | POA: Diagnosis not present

## 2021-07-01 NOTE — Therapy (Signed)
[facility] [place], [place], [address] Phone: [phone]   Fax:  [phone]  Physical Therapy Evaluation  Patient Details  Name: Rhonda Hickman MRN: [id number] Date of Birth: 11/01/1955 Referring Provider (PT): Arther Abbott MD   Encounter Date: 07/01/2021   PT End of Session - 07/01/21 1407     Visit Number 1    Number of Visits 12    Date for PT Re-Evaluation 08/12/21    PT Start Time 1217    PT Stop Time 1247    PT Time Calculation (min) 30 min    Activity Tolerance Patient tolerated treatment well    Behavior During Therapy [facility] for tasks assessed/performed             Past Medical History:  Diagnosis Date   Anemia    Anxiety    Asthma    Atrial fibrillation ([place])    Bipolar 1 disorder ([place])    Bulging lumbar disc    Chronic pain of left knee    COPD (chronic obstructive pulmonary disease) ([place])    CVA (cerebral vascular accident) ([place])    Diabetes mellitus    Neuropathy    TIA (transient ischemic attack)    Vertigo     Past Surgical History:  Procedure Laterality Date   ABDOMINAL HYSTERECTOMY     CARPAL TUNNEL RELEASE Right    CHOLECYSTECTOMY     KNEE ARTHROSCOPY WITH LATERAL MENISECTOMY Left 06/08/2021   Procedure: KNEE ARTHROSCOPY WITH LATERAL MENISCECTOMY AND MEDIAL MENISCECTOMY;  Surgeon: Carole Civil, MD;  Location: [facility];  Service: Orthopedics;  Laterality: Left;   KNEE ARTHROSCOPY WITH MEDIAL MENISECTOMY Right 04/14/2016   Procedure: KNEE ARTHROSCOPY WITH MEDIAL AND LATERAL MENISECTOMY, MICROFRACTURE REPAIR;  Surgeon: Carole Civil, MD;  Location: [facility];  Service: Orthopedics;  Laterality: Right;  lateral menisectomy - needs crutch training    There were no vitals filed for this visit.    Subjective Assessment - 07/01/21 1414     Subjective COVID-19 screen performed prior to patient entering clinic. The patient presents to the clinic today s/p left knee arthroscopy  performed on 06/08/21.  Her pain-level is rated at an 8/10 today.  She states she had a fall after surgery while trying to walk with crutches.  Being off her feet decreases her pain and being up increases her pain.  She has been doing a HEP and has iced her knee at home.    Pertinent History DM, Bipolar, right CTR, right knee surgery, CVA/TIA.    How long can you stand comfortably? Varies.    How long can you walk comfortably? Around home.    Patient Stated Goals Get around without pain.    Currently in Pain? Yes    Pain Score 8     Pain Location Knee    Pain Orientation Left;Posterior    Pain Descriptors / Indicators Aching    Pain Type Surgical pain    Pain Onset 1 to 4 weeks ago    Pain Frequency Constant    Aggravating Factors  See above.    Pain Relieving Factors See above.                [facility] PT Assessment - 07/01/21 0001       Assessment   Medical Diagnosis Left knee athroscopic surgery.    Referring Provider (PT) Arther Abbott MD    Onset Date/Surgical Date --   06/08/21 (surgery date).     Precautions  Precaution Comments Pain-free left LE ther ex.      Restrictions   Weight Bearing Restrictions No      Balance Screen   Has the patient fallen in the past 6 months Yes    How many times? 7.    Has the patient had a decrease in activity level because of a fear of falling?  Yes    Is the patient reluctant to leave their home because of a fear of falling?  Yes      [place] residence      Observation/Other Assessments   Observations Left knee scope sites appear to be healing well.      Observation/Other Assessments-Edema    Edema Circumferential      Circumferential Edema   Circumferential - Left  LT 1.5 cms > RT.      Posture/Postural Control   Posture Comments Left knee genu valgum.      ROM / Strength   AROM / PROM / Strength AROM;Strength      AROM   Overall AROM Comments Full left knee extension in supine  and flexion to 100 degrees.      Strength   Overall Strength Comments The patient is able to perform a left knee antigravity SLR without extensor lag.  Left quadriceps strength graded grossly at 4-/5.      Palpation   Palpation comment Tender to palpation over left knee medial joint line and popliteal fossa.      Ambulation/Gait   Gait Comments The patient walked with a quad cane on the right.  She required cues for proper sequencing and HHA on right for safety.  She also navigated two steps (up and down) with cues.  She has a FWW at home.  We tried one in the clinic today and she performed very well and safe.  Recommended at this time she stay with the [place] and we will work to safely transition her to a cane during her PT sessions.                        Objective measurements completed on examination: See above findings.                     PT Long Term Goals - 07/01/21 1450       PT LONG TERM GOAL #1   Title Independent with a HEP.    Baseline No knowledge of appropriate ther ex    Time 6    Period Weeks    Status New      PT LONG TERM GOAL #2   Title Active knee flexion to 120 degrees+ so the patient can perform functional tasks and do so with pain not > 2-3/10.    Baseline 100 degrees.    Time 6    Period Weeks    Status New      PT LONG TERM GOAL #3   Title Increase left knee strength to a solid 4+/5 to provide good stability for accomplishment of functional activities.    Baseline 4-/5.    Time 6    Period Weeks    Status New      PT LONG TERM GOAL #4   Title Perform a reciprocating stair gait with one railing with pain not > 3/10.    Baseline Cannot perform.    Time 6    Period Weeks    Status New  PT LONG TERM GOAL #5   Title Pt will report no more than 3-4/10 pain with daily activity to improve her comfort and tolerance with ADLs.    Baseline 8/10 pain-level.    Time 6    Period Weeks    Status New                     Plan - 07/01/21 1432     Clinical Impression Statement The patient presents to the clinic today s/p left knee arthroscopic surgery performed on 06/08/21.  She reports a high pain-level today.  She is performing a HEP.  She exhibits full left knee extension but lacks flexion at this time.  She walked in the clinic today with a quad cane and required cues for proper sequencing and HHA on the right for safety.  She was much safer and more stable with a FWW and it is recommended she continue with this at this time (she has one at home).  She lacks some quadriceps strength but is able to perform an antigravity SLR without extensor lag.  She has minimal edema present today.  Patient will benefit from skilled physical therapy intervention to address pain and deficits.    Personal Factors and Comorbidities Comorbidity 1;Comorbidity 2;Other    Comorbidities DM, Bipolar, right CTR, right knee surgery, CVA/TIA.    Examination-Activity Limitations Other;Locomotion Level;Stand;Stairs    Examination-Participation Restrictions Other    Stability/Clinical Decision Making Stable/Uncomplicated    Clinical Decision Making Low    Rehab Potential Good    PT Frequency 2x / week    PT Duration 6 weeks    PT Treatment/Interventions ADLs/Self Care Home Management;Gait training;Stair training;Functional mobility training;Therapeutic activities;Therapeutic exercise;Neuromuscular re-education;Manual techniques;Patient/family education;Passive range of motion    PT Next Visit Plan Nustep, pain-free left LE ther ex, gait training with progression to QC/straight cane.    Consulted and Agree with Plan of Care Patient             Patient will benefit from skilled therapeutic intervention in order to improve the following deficits and impairments:  Pain, Abnormal gait, Difficulty walking, Decreased activity tolerance, Decreased range of motion, Decreased strength, Increased edema  Visit Diagnosis: Chronic  pain of left knee - Plan: PT plan of care cert/re-cert  Stiffness of left knee, not elsewhere classified - Plan: PT plan of care cert/re-cert  Localized edema - Plan: PT plan of care cert/re-cert  Muscle weakness (generalized) - Plan: PT plan of care cert/re-cert     Problem List Patient Active Problem List   Diagnosis Date Noted   Tear of meniscus of knee joint    Acute lower UTI 05/23/2021   A-fib ([place]) 05/22/2021   Leukocytosis 05/22/2021   Fall at home, initial encounter 05/22/2021    Class: Acute   HLD (hyperlipidemia) 05/22/2021   Hypertension 03/18/2020   Bipolar 1 disorder ([place])    Chronic atrial fibrillation ([place]) 12/03/2019   TIA (transient ischemic attack) 12/03/2019   Diabetic peripheral neuropathy ([place]) 12/03/2019   Chronic migraine 12/03/2019   Vertigo 08/03/2019   Bronchiectasis with acute exacerbation (HCC)    Obesity, Class III, BMI 40-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) ([place])    Gastroesophageal reflux disease    Asthma exacerbation 06/06/2018   Gait abnormality 08/29/2017   Paresthesia 08/29/2017   Derangement of posterior horn of medial meniscus of right knee    Meniscus, lateral, derangement, right    Arthritis of knee, right    COPD with acute exacerbation ([place]) 03/09/2016  Type 2 diabetes mellitus ([place]) 02/16/2007   Cocaine abuse ([place]) 02/16/2007   Extrinsic asthma 02/16/2007   HOMELESSNESS, HX OF 02/16/2007    Nithya Meriweather, [place], PT 07/01/2021, 3:14 PM  [facility] [address] [place], [place], [address] Phone: [phone]   Fax:  [phone]  Name: Rhonda Hickman MRN: [id number] Date of Birth: 1955/12/25

## 2021-07-05 DIAGNOSIS — R6889 Other general symptoms and signs: Secondary | ICD-10-CM | POA: Diagnosis not present

## 2021-07-07 DIAGNOSIS — M5416 Radiculopathy, lumbar region: Secondary | ICD-10-CM | POA: Diagnosis not present

## 2021-07-07 DIAGNOSIS — M25562 Pain in left knee: Secondary | ICD-10-CM | POA: Diagnosis not present

## 2021-07-07 DIAGNOSIS — G8929 Other chronic pain: Secondary | ICD-10-CM | POA: Diagnosis not present

## 2021-07-07 DIAGNOSIS — M25561 Pain in right knee: Secondary | ICD-10-CM | POA: Diagnosis not present

## 2021-07-07 DIAGNOSIS — Z79899 Other long term (current) drug therapy: Secondary | ICD-10-CM | POA: Diagnosis not present

## 2021-07-09 DIAGNOSIS — Z79899 Other long term (current) drug therapy: Secondary | ICD-10-CM | POA: Diagnosis not present

## 2021-07-13 DIAGNOSIS — Z9889 Other specified postprocedural states: Secondary | ICD-10-CM | POA: Insufficient documentation

## 2021-07-14 ENCOUNTER — Encounter: Payer: Medicaid Other | Admitting: Orthopedic Surgery

## 2021-07-14 ENCOUNTER — Other Ambulatory Visit (INDEPENDENT_AMBULATORY_CARE_PROVIDER_SITE_OTHER): Payer: Self-pay | Admitting: Nurse Practitioner

## 2021-07-14 DIAGNOSIS — Z9889 Other specified postprocedural states: Secondary | ICD-10-CM

## 2021-07-15 NOTE — Telephone Encounter (Signed)
Medication Refill - Medication: XARELTO 20 MG TABS tablet  Has the patient contacted their pharmacy? Yes.   (Agent: If no, request that the patient contact the pharmacy for the refill. If patient does not wish to contact the pharmacy document the reason why and proceed with request.) (Agent: If yes, when and what did the pharmacy advise?)    pharmacy called and made the request   Preferred Pharmacy (with phone number or street name): [facility]  Has the patient been seen for an appointment in the last year OR does the patient have an upcoming appointment? Yes.    Agent: Please be advised that RX refills may take up to 3 business days. We ask that you follow-up with your pharmacy.

## 2021-07-15 NOTE — Telephone Encounter (Signed)
Request routed to PCP to refill if appropriate.

## 2021-07-15 NOTE — Telephone Encounter (Signed)
Requested medication (s) are due for refill today - yes  Requested medication (s) are on the active medication list -yes  Future visit scheduled -yes  Last refill: 04/07/21 #30 2RF  Notes to clinic: Request RF: fails lab protocol  Requested Prescriptions  Pending Prescriptions Disp Refills   XARELTO 20 MG TABS tablet [Pharmacy Med Name: Xarelto 20 mg tablet] 30 tablet 2    Sig: TAKE ONE TABLET BY MOUTH WITH SUPPER     Hematology: Anticoagulants - rivaroxaban Failed - 07/15/2021  2:04 PM      Failed - ALT in normal range and within 180 days    ALT  Date Value Ref Range Status  03/10/2021 34 (H) 0 - 32 IU/L Final          Failed - Cr in normal range and within 360 days    Creatinine, Ser  Date Value Ref Range Status  05/24/2021 1.04 (H) 0.44 - 1.00 mg/dL Final   Creatinine, Urine  Date Value Ref Range Status  12/17/2019 128.15 mg/dL Final    Comment:    Performed at [facility], [address] [place], [place] [address]          Failed - HCT in normal range and within 360 days    HCT  Date Value Ref Range Status  05/24/2021 32.1 (L) 36.0 - 46.0 % Final   Hematocrit  Date Value Ref Range Status  08/05/2020 38.9 34.0 - 46.6 % Final          Failed - HGB in normal range and within 360 days    Hemoglobin  Date Value Ref Range Status  05/24/2021 10.7 (L) 12.0 - 15.0 g/dL Final  08/05/2020 12.8 11.1 - 15.9 g/dL Final          Passed - AST in normal range and within 180 days    AST  Date Value Ref Range Status  03/10/2021 20 0 - 40 IU/L Final          Passed - PLT in normal range and within 360 days    Platelets  Date Value Ref Range Status  05/24/2021 205 150 - 400 K/uL Final  08/05/2020 201 150 - 450 x10E3/uL Final          Passed - Valid encounter within last 12 months    Recent Outpatient Visits           4 months ago Type 2 diabetes mellitus with 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, with long-term current use of insulin ([place])   [place], Michelle P, NP   7 months ago Type 2 diabetes mellitus with 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, with long-term current use of insulin ([place])   [place], Michelle P, NP   10 months ago Type 2 diabetes mellitus with 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, with long-term current use of insulin ([place])   [place], Michelle P, NP   1 year ago Type 2 diabetes mellitus with 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, with long-term current use of insulin ([place])   [place], Michelle P, NP   1 year ago Dysuria   [place], [place], NP       Future Appointments             In 2 weeks [name], NP [place]   In 2 months [name], MD [facility], [facility]  Requested Prescriptions  Pending Prescriptions Disp Refills   XARELTO 20 MG TABS tablet [Pharmacy Med Name: Xarelto 20 mg tablet] 30 tablet 2    Sig: TAKE ONE TABLET BY MOUTH WITH SUPPER     Hematology: Anticoagulants - rivaroxaban Failed - 07/15/2021  2:04 PM      Failed - ALT in normal range and within 180 days    ALT  Date Value Ref Range Status  03/10/2021 34 (H) 0 - 32 IU/L Final          Failed - Cr in normal range and within 360 days    Creatinine, Ser  Date Value Ref Range Status  05/24/2021 1.04 (H) 0.44 - 1.00 mg/dL Final   Creatinine, Urine  Date Value Ref Range Status  12/17/2019 128.15 mg/dL Final    Comment:    Performed at [facility], [address], [place], [place] [address]          Failed - HCT in normal range and within 360 days    HCT  Date Value Ref Range Status  05/24/2021 32.1 (L) 36.0 - 46.0 % Final   Hematocrit  Date Value Ref Range Status  08/05/2020 38.9 34.0 - 46.6 % Final          Failed - HGB in normal range and within 360 days    Hemoglobin  Date Value Ref Range Status  05/24/2021  10.7 (L) 12.0 - 15.0 g/dL Final  08/05/2020 12.8 11.1 - 15.9 g/dL Final          Passed - AST in normal range and within 180 days    AST  Date Value Ref Range Status  03/10/2021 20 0 - 40 IU/L Final          Passed - PLT in normal range and within 360 days    Platelets  Date Value Ref Range Status  05/24/2021 205 150 - 400 K/uL Final  08/05/2020 201 150 - 450 x10E3/uL Final          Passed - Valid encounter within last 12 months    Recent Outpatient Visits           4 months ago Type 2 diabetes mellitus with 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, with long-term current use of insulin ([place])   [place] [facility] [name] P, NP   7 months ago Type 2 diabetes mellitus with 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, with long-term current use of insulin ([place])   [place], Michelle P, NP   10 months ago Type 2 diabetes mellitus with 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, with long-term current use of insulin ([place])   [place] [facility] [name], NP   1 year ago Type 2 diabetes mellitus with 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, with long-term current use of insulin ([place])   [place], Michelle P, NP   1 year ago Dysuria   [place], [place], NP       Future Appointments             In 2 weeks [name], NP [place]   In 2 months [name], MD [facility], [facility]

## 2021-07-19 ENCOUNTER — Encounter: Payer: Self-pay | Admitting: Orthopedic Surgery

## 2021-07-19 ENCOUNTER — Ambulatory Visit (INDEPENDENT_AMBULATORY_CARE_PROVIDER_SITE_OTHER): Payer: Medicaid Other | Admitting: Orthopedic Surgery

## 2021-07-19 ENCOUNTER — Other Ambulatory Visit: Payer: Self-pay

## 2021-07-19 DIAGNOSIS — Z9889 Other specified postprocedural states: Secondary | ICD-10-CM

## 2021-07-19 NOTE — Progress Notes (Signed)
Chief Complaint  Patient presents with   Post-op Follow-up    Left knee / waiting for more visits of therapy to be approved by Medicaid / 06/08/21 left knee arthroscopy    Postop visit status post knee arthroscopy where we performed lateral and medial meniscectomies and found various areas of cartilage degeneration  She has been able to go to to 1 or 2 therapy visits and she is doing exercises at home  Today we find an effusion some difficulty extending and flexing the knee although passively I can move the knee into full extension it hurts and then on flexion its only about 105 degrees  Recommend ice 4 times a day Home exercise program follow-up in 4 weeks  Continue using walker for support

## 2021-07-22 ENCOUNTER — Ambulatory Visit: Payer: Medicaid Other

## 2021-07-23 ENCOUNTER — Encounter: Payer: Medicaid Other | Admitting: Physical Therapy

## 2021-07-26 ENCOUNTER — Ambulatory Visit: Payer: Medicaid Other | Attending: Orthopedic Surgery | Admitting: Physical Therapy

## 2021-07-26 ENCOUNTER — Other Ambulatory Visit: Payer: Self-pay

## 2021-07-26 DIAGNOSIS — M25662 Stiffness of left knee, not elsewhere classified: Secondary | ICD-10-CM | POA: Diagnosis not present

## 2021-07-26 DIAGNOSIS — G8929 Other chronic pain: Secondary | ICD-10-CM | POA: Diagnosis not present

## 2021-07-26 DIAGNOSIS — M25562 Pain in left knee: Secondary | ICD-10-CM | POA: Diagnosis not present

## 2021-07-26 DIAGNOSIS — R600 Localized edema: Secondary | ICD-10-CM | POA: Diagnosis not present

## 2021-07-26 NOTE — Therapy (Addendum)
[facility] [place], [place], [address] Phone: [phone]   Fax:  [phone]  Physical Therapy Treatment  Patient Details  Name: Rhonda Hickman MRN: [id number] Date of Birth: [birth year] Referring Provider (PT): Arther Abbott MD   Encounter Date: 07/26/2021   PT End of Session - 07/26/21 1333     Visit Number 2    Number of Visits 12    Date for PT Re-Evaluation 08/12/21    PT Start Time 0103    PT Stop Time 0128    PT Time Calculation (min) 25 min    Activity Tolerance Patient limited by pain    Behavior During Therapy Restless             Past Medical History:  Diagnosis Date   Anemia    Anxiety    Asthma    Atrial fibrillation ([place])    Bipolar 1 disorder ([place])    Bulging lumbar disc    Chronic pain of left knee    COPD (chronic obstructive pulmonary disease) ([place])    CVA (cerebral vascular accident) ([place])    Diabetes mellitus    Neuropathy    TIA (transient ischemic attack)    Vertigo     Past Surgical History:  Procedure Laterality Date   ABDOMINAL HYSTERECTOMY     CARPAL TUNNEL RELEASE Right    CHOLECYSTECTOMY     KNEE ARTHROSCOPY WITH LATERAL MENISECTOMY Left 06/08/2021   Procedure: KNEE ARTHROSCOPY WITH LATERAL MENISCECTOMY AND MEDIAL MENISCECTOMY;  Surgeon: Carole Civil, MD;  Location: [facility];  Service: Orthopedics;  Laterality: Left;   KNEE ARTHROSCOPY WITH MEDIAL MENISECTOMY Right 04/14/2016   Procedure: KNEE ARTHROSCOPY WITH MEDIAL AND LATERAL MENISECTOMY, MICROFRACTURE REPAIR;  Surgeon: Carole Civil, MD;  Location: [facility];  Service: Orthopedics;  Laterality: Right;  lateral menisectomy - needs crutch training    There were no vitals filed for this visit.   Subjective Assessment - 07/26/21 1335     Subjective COVID-19 screen performed prior to patient entering clinic.  Patient presents to the clinic today with a FWW.  She rates her pain at a 9/10.    Pertinent History  DM, Bipolar, right CTR, right knee surgery, CVA/TIA.    How long can you stand comfortably? Varies.    How long can you walk comfortably? Around home.    Patient Stated Goals Get around without pain.    Currently in Pain? Yes    Pain Score 9     Pain Orientation Left    Pain Descriptors / Indicators Aching;Throbbing    Pain Type Surgical pain    Pain Onset More than a month ago    Pain Frequency Constant                               [facility] Adult PT Treatment/Exercise - 07/26/21 0001       Exercises   Exercises Knee/Hip      Knee/Hip Exercises: Aerobic   Nustep Level 1 slow cadence x 10 minutes.      Knee/Hip Exercises: Supine   Short Arc Quad Sets Limitations Non-resisted x 2 minutes.    Other Supine Knee/Hip Exercises Assisted SLR's.      Manual Therapy   Manual Therapy Passive ROM    Passive ROM In supine:  Very gentle left calf stretch and left knee overpressure stretch (2 minutes).  PT Long Term Goals - 07/01/21 1450       PT LONG TERM GOAL #1   Title Independent with a HEP.    Baseline No knowledge of appropriate ther ex    Time 6    Period Weeks    Status New      PT LONG TERM GOAL #2   Title Active knee flexion to 120 degrees+ so the patient can perform functional tasks and do so with pain not > 2-3/10.    Baseline 100 degrees.    Time 6    Period Weeks    Status New      PT LONG TERM GOAL #3   Title Increase left knee strength to a solid 4+/5 to provide good stability for accomplishment of functional activities.    Baseline 4-/5.    Time 6    Period Weeks    Status New      PT LONG TERM GOAL #4   Title Perform a reciprocating stair gait with one railing with pain not > 3/10.    Baseline Cannot perform.    Time 6    Period Weeks    Status New      PT LONG TERM GOAL #5   Title Pt will report no more than 3-4/10 pain with daily activity to improve her comfort and tolerance with ADLs.     Baseline 8/10 pain-level.    Time 6    Period Weeks    Status New                   Plan - 07/26/21 1338     Clinical Impression Statement The patient presents to the clinic today with a reported pain-level of 9/10.  Performed very low-level ther ex today.  Patient has a very low tolerance for ther ex and requested an abbreviated treatment.  She sttaes she is doing 4 home exercises at include SLR's and SAQ's.    Personal Factors and Comorbidities Comorbidity 1;Comorbidity 2;Other    Comorbidities DM, Bipolar, right CTR, right knee surgery, CVA/TIA.    Examination-Activity Limitations Other;Locomotion Level;Stand;Stairs    Examination-Participation Restrictions Other    Stability/Clinical Decision Making Stable/Uncomplicated    Rehab Potential Good    PT Frequency 2x / week    PT Duration 6 weeks    PT Treatment/Interventions ADLs/Self Care Home Management;Gait training;Stair training;Functional mobility training;Therapeutic activities;Therapeutic exercise;Neuromuscular re-education;Manual techniques;Patient/family education;Passive range of motion    PT Next Visit Plan Nustep, pain-free left LE ther ex, gait training with progression to QC/straight cane.    Consulted and Agree with Plan of Care Patient             Patient will benefit from skilled therapeutic intervention in order to improve the following deficits and impairments:  Pain, Abnormal gait, Difficulty walking, Decreased activity tolerance, Decreased range of motion, Decreased strength, Increased edema  Visit Diagnosis: Chronic pain of left knee  Stiffness of left knee, not elsewhere classified  Localized edema     Problem List Patient Active Problem List   Diagnosis Date Noted   S/P left knee arthroscopy11/29/22 07/13/2021   Tear of meniscus of knee joint    Acute lower UTI 05/23/2021   A-fib ([place]) 05/22/2021   Leukocytosis 05/22/2021   Fall at home, initial encounter 05/22/2021    Class: Acute    HLD (hyperlipidemia) 05/22/2021   Hypertension 03/18/2020   Bipolar 1 disorder ([place])    Chronic atrial fibrillation ([place]) 12/03/2019   TIA (transient  ischemic attack) 12/03/2019   Diabetic peripheral neuropathy ([place]) 12/03/2019   Chronic migraine 12/03/2019   Vertigo 08/03/2019   Bronchiectasis with acute exacerbation (HCC)    Obesity, Class III, BMI 40-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) ([place])    Gastroesophageal reflux disease    Asthma exacerbation 06/06/2018   Gait abnormality 08/29/2017   Paresthesia 08/29/2017   Derangement of posterior horn of medial meniscus of right knee    Meniscus, lateral, derangement, right    Arthritis of knee, right    COPD with acute exacerbation ([place]) 03/09/2016   Type 2 diabetes mellitus ([place]) 02/16/2007   Cocaine abuse ([place]) 02/16/2007   Extrinsic asthma 02/16/2007   HOMELESSNESS, HX OF 02/16/2007    Emma Schupp, [place], PT 07/26/2021, 2:45 PM  [facility] [address] [place], [place], [address] Phone: [phone]   Fax:  [phone]  Name: Rhonda Hickman MRN: [id number] Date of Birth: 10-26-1955  PHYSICAL THERAPY DISCHARGE SUMMARY  Visits from Start of Care: 2.  Current functional level related to goals / functional outcomes: See above.   Remaining deficits: See below.   Education / Equipment:    Patient agrees to discharge. Patient goals were not met. Patient is being discharged due to not returning since the last visit.    [place] Dreshawn Hendershott MPT

## 2021-07-26 NOTE — Patient Instructions (Signed)
[place] EXERCISE PROGRAM Created by Mali Grayden Burley Jan 16th, 2023 View at [email or web address] using code: [email or web address] Total 2 Page 1 of 1 QUAD SETS - ISOMETRIC QUADS Sit down and straighten your leg and knee. Tighten your top thigh muscle to press the back of your knee downward. Hold this and then relax and repeat. Repeat 10 Times Hold 10 Seconds Complete 2 Sets Perform 4 Times a Day HEEL SLIDES - SUPINE Lying on your back with knees straight, slide the affected heel towards your buttock as you bend your knee. Hold a gentle stretch in this position and then return to original position. Repeat 15 Times Hold 5 Seconds Complete 2 Sets Perform 4 Times a

## 2021-07-29 ENCOUNTER — Telehealth (INDEPENDENT_AMBULATORY_CARE_PROVIDER_SITE_OTHER): Payer: Self-pay | Admitting: Primary Care

## 2021-07-29 ENCOUNTER — Other Ambulatory Visit (INDEPENDENT_AMBULATORY_CARE_PROVIDER_SITE_OTHER): Payer: Self-pay | Admitting: Primary Care

## 2021-07-29 ENCOUNTER — Encounter: Payer: Medicaid Other | Admitting: Physical Therapy

## 2021-07-29 MED ORDER — RIVAROXABAN 20 MG PO TABS: ORAL_TABLET | ORAL | 2 refills | Status: DC

## 2021-07-29 NOTE — Telephone Encounter (Signed)
Copied from [place] [id number]. Topic: Quick Communication - Rx Refill/Question >> Jul 29, 2021  9:55 AM Leward Quan A wrote: Medication: Alveda Reasons 20 MG TABS tablet  Per pharmacy need Rx today to send medication out tomorrow  Has the patient contacted their pharmacy? Yes.  Pharmacy called  (Agent: If no, request that the patient contact the pharmacy for the refill. If patient does not wish to contact the pharmacy document the reason why and proceed with request.) (Agent: If yes, when and what did the pharmacy advise?)  Preferred Pharmacy (with phone number or street name): [facility], [place]  Phone:  [phone] Fax:  [phone]    Has the patient been seen for an appointment in the last year OR does the patient have an upcoming appointment? Yes.    Agent: Please be advised that RX refills may take up to 3 business days. We ask that you follow-up with your pharmacy.

## 2021-07-29 NOTE — Telephone Encounter (Signed)
Routed to PCP 

## 2021-08-02 NOTE — Telephone Encounter (Signed)
Call placed to patient. She said she needs a larger walker or rollator and she will discuss this PCP at her appointment tomorrow.

## 2021-08-03 ENCOUNTER — Ambulatory Visit (INDEPENDENT_AMBULATORY_CARE_PROVIDER_SITE_OTHER): Payer: Medicaid Other | Admitting: Primary Care

## 2021-08-03 ENCOUNTER — Encounter (INDEPENDENT_AMBULATORY_CARE_PROVIDER_SITE_OTHER): Payer: Self-pay | Admitting: Primary Care

## 2021-08-03 ENCOUNTER — Other Ambulatory Visit: Payer: Self-pay

## 2021-08-03 VITALS — BP 114/64 | HR 60 | Temp 97.9°F | Ht 67.0 in | Wt 277.0 lb

## 2021-08-03 DIAGNOSIS — R269 Unspecified abnormalities of gait and mobility: Secondary | ICD-10-CM | POA: Diagnosis not present

## 2021-08-03 DIAGNOSIS — Z720 Tobacco use: Secondary | ICD-10-CM

## 2021-08-03 DIAGNOSIS — R4781 Slurred speech: Secondary | ICD-10-CM

## 2021-08-03 DIAGNOSIS — E1100 Type 2 diabetes mellitus with hyperosmolarity without nonketotic hyperglycemic-hyperosmolar coma (NKHHC): Secondary | ICD-10-CM | POA: Diagnosis not present

## 2021-08-03 MED ORDER — BLOOD GLUCOSE METER KIT: PACK | 0 refills | Status: DC

## 2021-08-03 MED ORDER — BUPROPION HCL ER (SR) 150 MG PO TB12: ORAL_TABLET | ORAL | 2 refills | Status: DC

## 2021-08-03 NOTE — Progress Notes (Signed)
Needs Rx for glucometer and test strips

## 2021-08-03 NOTE — Progress Notes (Signed)
Does the patient require and use a wheelchair to move around in the home?  yes  Does the patient have quadriplegia or a fixed hip angle?  no  Does the patient have a trunk or brace cast or other brace that requires a reclining back feature for positioning? no  Does the patient have excessive extensor tone of the trunk muscles?  no  Does the patient need to rest in a recumbent position two or more times a day? yes  The patient has the following condition(s) that prevent a 90 degree flexion of the knee: no  Does the patient have a need for arm height different than available using non-adjustable arms? no  Is the patient able to adequately self-propel in the standard weight wheelchair and walker ?  yes  The answers to the above questions were provided by:  PCP

## 2021-08-03 NOTE — Progress Notes (Signed)
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  A extra large walker will not resolve  issue with performing activities of daily living. A extra large walker will allow patient to safely perform daily activities. Patient can safely use walker  in the home or has a caregiver who can provide assistance.   Accessories: elevating leg rests (ELRs), wheel locks, extensions and anti-tippers. Duration of need: life time  Comprehensive ROS Pertinent positive and negative noted in HPI  Physical exam: General: Vital signs reviewed.  Patient is well-developed and well-nourished, obese female in no acute distress and cooperative with exam. Head: Normocephalic and atraumatic. Eyes: EOMI, conjunctivae normal, no scleral icterus. Neck: Supple, trachea midline, normal ROM, no JVD, masses, thyromegaly, or carotid bruit present. Cardiovascular: RRR, S1 normal, S2 normal, no murmurs, gallops, or rubs. Pulmonary/Chest: Clear to auscultation bilaterally, no wheezes, rales, or rhonchi. Abdominal: Soft, non-tender, non-distended, BS +, no masses, organomegaly, or guarding present. Musculoskeletal: No joint deformities, erythema, or stiffness, ROM full and nontender. Extremities: lower extremity weakness RT>lf bilaterally, Neurological: A&O x3 Skin: Warm, dry and intact. No rashes or erythema. Psychiatric: Normal mood and affect. speech and behavior is normal. Cognition and memory are normal.    [name] was seen today for nicotine dependence.  Diagnoses and all orders for this visit:  Type 2 diabetes mellitus with 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, without long-term current use of insulin (HCC) -     blood glucose meter kit and supplies; Dispense based on patient and insurance preference. Use up to four times daily as directed. (FOR ICD-10 E10.9,  E11.9).  Slurred speech CVA weakness aphagia  Gait abnormality -     For home use only DME Other see comment  Tobacco use -     buPROPion (WELLBUTRIN SR) 150 MG 12 hr tablet; Take one tablet daily for 3 days. Then, take one tablet twice daily for 3 months.      This note has been created with Surveyor, quantity. Any transcriptional errors are unintentional.   [name], NP 08/03/2021, 11:34 AM

## 2021-08-06 DIAGNOSIS — M25562 Pain in left knee: Secondary | ICD-10-CM | POA: Diagnosis not present

## 2021-08-06 DIAGNOSIS — Z79899 Other long term (current) drug therapy: Secondary | ICD-10-CM | POA: Diagnosis not present

## 2021-08-06 DIAGNOSIS — G8929 Other chronic pain: Secondary | ICD-10-CM | POA: Diagnosis not present

## 2021-08-06 DIAGNOSIS — M25561 Pain in right knee: Secondary | ICD-10-CM | POA: Diagnosis not present

## 2021-08-06 DIAGNOSIS — M5416 Radiculopathy, lumbar region: Secondary | ICD-10-CM | POA: Diagnosis not present

## 2021-08-24 ENCOUNTER — Other Ambulatory Visit: Payer: Self-pay | Admitting: Cardiology

## 2021-08-24 ENCOUNTER — Other Ambulatory Visit (INDEPENDENT_AMBULATORY_CARE_PROVIDER_SITE_OTHER): Payer: Self-pay | Admitting: Primary Care

## 2021-08-24 ENCOUNTER — Other Ambulatory Visit (INDEPENDENT_AMBULATORY_CARE_PROVIDER_SITE_OTHER): Payer: Self-pay | Admitting: Nurse Practitioner

## 2021-08-24 DIAGNOSIS — Z760 Encounter for issue of repeat prescription: Secondary | ICD-10-CM

## 2021-08-24 DIAGNOSIS — Z794 Long term (current) use of insulin: Secondary | ICD-10-CM

## 2021-08-24 DIAGNOSIS — E1100 Type 2 diabetes mellitus with hyperosmolarity without nonketotic hyperglycemic-hyperosmolar coma (NKHHC): Secondary | ICD-10-CM

## 2021-08-24 NOTE — Telephone Encounter (Signed)
Requested Prescriptions  Pending Prescriptions Disp Refills   LANTUS SOLOSTAR 100 UNIT/ML Solostar Pen [Pharmacy Med Name: Lantus Solostar U-100 Insulin 100 unit/mL (3 mL) subcutaneous pen] 15 mL 3    Sig: INJECT 20 UNITS TWICE DAILY AS DIRECTED     Endocrinology:  Diabetes - Insulins Failed - 08/24/2021  9:05 AM      Failed - HBA1C is between 0 and 7.9 and within 180 days    Hgb A1c MFr Bld  Date Value Ref Range Status  05/22/2021 8.9 (H) 4.8 - 5.6 % Final    Comment:    (NOTE) Pre diabetes:          5.7%-6.4%  Diabetes:              >6.4%  Glycemic control for   <7.0% adults with diabetes          Passed - Valid encounter within last 6 months    Recent Outpatient Visits          3 weeks ago Type 2 diabetes mellitus with 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, without long-term current use of insulin ([place])   [place] [facility] [name] P, NP   5 months ago Type 2 diabetes mellitus with 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, with long-term current use of insulin ([place])   [place], Michelle P, NP   8 months ago Type 2 diabetes mellitus with 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, with long-term current use of insulin ([place])   [place], Michelle P, NP   11 months ago Type 2 diabetes mellitus with 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, with long-term current use of insulin ([place])   [place] [facility] [name] P, NP   1 year ago Type 2 diabetes mellitus with 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, with long-term current use of insulin ([place])   [place], [place] I, NP      Future Appointments            In 1 month [name], MD [facility], [facility]

## 2021-08-30 ENCOUNTER — Other Ambulatory Visit: Payer: Self-pay

## 2021-08-30 ENCOUNTER — Ambulatory Visit (INDEPENDENT_AMBULATORY_CARE_PROVIDER_SITE_OTHER): Payer: Medicaid Other | Admitting: Orthopedic Surgery

## 2021-08-30 DIAGNOSIS — M5136 Other intervertebral disc degeneration, lumbar region: Secondary | ICD-10-CM

## 2021-08-30 DIAGNOSIS — Z9889 Other specified postprocedural states: Secondary | ICD-10-CM

## 2021-08-30 NOTE — Patient Instructions (Signed)
You have been referred to [facility] on [address] in [place], they will call you with appointment. If you have not heard anything in one week call them to schedule [phone]   

## 2021-08-30 NOTE — Progress Notes (Signed)
Chief Complaint  Patient presents with   Routine Post Op    DOS 06/08/21 S/p left knee arthroscopy    This is a postop visit almost 12 weeks postop from left knee arthroscopy  She had a horizontal tear mid body lateral meniscus normal articular cartilage in the femoral condyle grade 2 cartilage chondromalacia on the lateral tibial plateau  ACL and PCL were intact horizontal tear posterior horn medial meniscus, fibrocartilage filled areas on the medial femoral condyle with multiple cartilage defects and grade 2 lesion of the trochlea  Main complaint is that the knee continues to buckle  She is currently walking with a walker she is doing well with her extension exercises she can hold the leg in extension very well her flexion is now up to 115 degrees  She still has the joint effusion  Back in 2019 she had an MRI of the back she had epidural injections  L4-L5: Mild broad-based disc bulge. Severe bilateral facet arthropathy. Mild bilateral foraminal stenosis. No central canal stenosis.   L5-S1: Broad-based disc bulge with a small central disc protrusion. Mild bilateral facet arthropathy. Mild bilateral foraminal stenosis. No central canal stenosis.   IMPRESSION: 1. At L4-5 there is a mild broad-based disc bulge. Severe bilateral facet arthropathy. Mild bilateral foraminal stenosis. 2. At L5-S1 there is a broad-based disc bulge with a small central disc protrusion. Mild bilateral facet arthropathy. Mild bilateral foraminal stenosis.   After scoping this knee is clear that she has intact ligament structures she has no muscle weakness in the quads  Recommend she see neurosurgery regarding the weakness in the left leg  She will see [place] in 3 months she will continue her exercise program for quad strengthening and range of motion

## 2021-09-03 DIAGNOSIS — Z79899 Other long term (current) drug therapy: Secondary | ICD-10-CM | POA: Diagnosis not present

## 2021-09-06 ENCOUNTER — Telehealth: Payer: Self-pay

## 2021-09-06 ENCOUNTER — Telehealth: Payer: Self-pay | Admitting: Orthopedic Surgery

## 2021-09-06 NOTE — Telephone Encounter (Signed)
She was referred to Neurosurgery I will call her.

## 2021-09-06 NOTE — Telephone Encounter (Signed)
She states they need notes about her lumbar injections that were done at [facility], I have printed, will send them

## 2021-09-06 NOTE — Telephone Encounter (Signed)
Patient is asking for you to give her a call. She has some questions involving the appointment that was set up with the doctor in [place].  [phone]

## 2021-09-06 NOTE — Telephone Encounter (Signed)
Patient called and wants Dr. Aline Brochure assistant to call her back

## 2021-09-08 ENCOUNTER — Other Ambulatory Visit (INDEPENDENT_AMBULATORY_CARE_PROVIDER_SITE_OTHER): Payer: Self-pay | Admitting: Primary Care

## 2021-09-08 DIAGNOSIS — Z760 Encounter for issue of repeat prescription: Secondary | ICD-10-CM

## 2021-09-08 DIAGNOSIS — Z794 Long term (current) use of insulin: Secondary | ICD-10-CM

## 2021-09-08 MED ORDER — SITAGLIPTIN PHOSPHATE 100 MG PO TABS: 100.0000 mg | ORAL_TABLET | Freq: Every day | ORAL | 1 refills | Status: DC

## 2021-09-10 DIAGNOSIS — M5416 Radiculopathy, lumbar region: Secondary | ICD-10-CM | POA: Diagnosis not present

## 2021-09-14 DIAGNOSIS — F329 Major depressive disorder, single episode, unspecified: Secondary | ICD-10-CM | POA: Diagnosis not present

## 2021-09-21 ENCOUNTER — Telehealth: Payer: Self-pay | Admitting: Orthopedic Surgery

## 2021-09-21 ENCOUNTER — Telehealth (INDEPENDENT_AMBULATORY_CARE_PROVIDER_SITE_OTHER): Payer: Self-pay

## 2021-09-21 NOTE — Telephone Encounter (Signed)
Spoke with patient. She states she went to see neurosurgery and they are having to wait on insurance to approve an MRI. She states that she is unable to stand and it is causing her to be bed bound. She is requesting a prescription for a wheelchair.  ?

## 2021-09-21 NOTE — Telephone Encounter (Signed)
Copied from [place] [phone]. Topic: General - Other ?>> Sep 21, 2021  1:49 PM Pawlus, [name] wrote: ?Reason for CRM: Pt wanted to know if Rhonda Hickman could put in an order for a Wheelchair, pt declined to make an appt, please advise. ?

## 2021-09-21 NOTE — Telephone Encounter (Signed)
Called patient to asked her orthopedic doctors to prescribed DME since she just had knee surgery if any problem patient will call back  ?

## 2021-09-21 NOTE — Telephone Encounter (Signed)
Pt is calling and wants the nurse to call --she didn't want to give any information  ?

## 2021-09-22 ENCOUNTER — Other Ambulatory Visit (INDEPENDENT_AMBULATORY_CARE_PROVIDER_SITE_OTHER): Payer: Self-pay | Admitting: Primary Care

## 2021-09-22 ENCOUNTER — Other Ambulatory Visit: Payer: Self-pay

## 2021-09-22 ENCOUNTER — Telehealth: Payer: Self-pay

## 2021-09-22 DIAGNOSIS — M5136 Other intervertebral disc degeneration, lumbar region: Secondary | ICD-10-CM

## 2021-09-22 DIAGNOSIS — M1710 Unilateral primary osteoarthritis, unspecified knee: Secondary | ICD-10-CM

## 2021-09-22 DIAGNOSIS — G8929 Other chronic pain: Secondary | ICD-10-CM

## 2021-09-22 DIAGNOSIS — S8392XA Sprain of unspecified site of left knee, initial encounter: Secondary | ICD-10-CM

## 2021-09-22 DIAGNOSIS — M23301 Other meniscus derangements, unspecified lateral meniscus, left knee: Secondary | ICD-10-CM

## 2021-09-22 DIAGNOSIS — M23322 Other meniscus derangements, posterior horn of medial meniscus, left knee: Secondary | ICD-10-CM

## 2021-09-22 NOTE — Telephone Encounter (Signed)
DME order for wheelchair entered. Order, demographics, and last office notes faxed to [facility].  ?

## 2021-09-22 NOTE — Telephone Encounter (Signed)
-----   Message from Uvaldo Bristle sent at 09/22/2021  2:35 PM EDT ----- ?Regarding: About phone note - pt has alled back ?Rhonda Hickman, Rhonda Hickman [[id number]] is requesting a call back about her request; thank you :) ? ?

## 2021-09-22 NOTE — Telephone Encounter (Signed)
Routed to PCP 

## 2021-09-22 NOTE — Telephone Encounter (Signed)
Called patient back. No answer. Her information and a DME order was faxed to [facility] for the wheelchair this afternoon.  ?

## 2021-09-22 NOTE — Telephone Encounter (Signed)
Okay 

## 2021-09-23 ENCOUNTER — Other Ambulatory Visit (INDEPENDENT_AMBULATORY_CARE_PROVIDER_SITE_OTHER): Payer: Self-pay | Admitting: Primary Care

## 2021-09-27 ENCOUNTER — Telehealth: Payer: Self-pay | Admitting: Orthopedic Surgery

## 2021-09-27 NOTE — Telephone Encounter (Signed)
Done

## 2021-09-27 NOTE — Telephone Encounter (Signed)
Tried to return call to [place]. No answer. Pt's ht is 5'7 and wt is 277lb for wheelchair ?

## 2021-09-27 NOTE — Telephone Encounter (Signed)
Call via voice message from Home equipment department at [facility] [phone] -  requests patient's height and weight for wheelchair order. ?

## 2021-09-27 NOTE — Telephone Encounter (Signed)
Update- PH# for DME dept at [facility] is [phone] / Fax [phone] ?

## 2021-09-27 NOTE — Telephone Encounter (Signed)
Spoke with CA. Gave ht and weight of patient for wheelchair order ?

## 2021-09-28 ENCOUNTER — Ambulatory Visit (INDEPENDENT_AMBULATORY_CARE_PROVIDER_SITE_OTHER): Payer: Self-pay | Admitting: *Deleted

## 2021-09-28 DIAGNOSIS — M5137 Other intervertebral disc degeneration, lumbosacral region: Secondary | ICD-10-CM | POA: Diagnosis not present

## 2021-09-28 DIAGNOSIS — M2392 Unspecified internal derangement of left knee: Secondary | ICD-10-CM | POA: Diagnosis not present

## 2021-09-28 DIAGNOSIS — M25562 Pain in left knee: Secondary | ICD-10-CM | POA: Diagnosis not present

## 2021-09-28 NOTE — Telephone Encounter (Signed)
? ? ? ?  Chief Complaint: Low blood sugar, hallucinations at times"Rare" ?Symptoms: BS running 80,115,113, lowest 50 over "Months" Reports visual hallucinations "Sometimes, not often but I know they are not real, I think it's one of my medications" States has been taking afternoon and HS meds together. Reports taking Insulin as ordered.  ?Frequency: "Months" ?Pertinent Negatives: Patient denies  ?Disposition: '[]'$ ED /'[]'$ Urgent Care (no appt availability in office) / '[]'$ Appointment(In office/virtual)/ '[]'$  [facility]/ '[]'$ Home Care/ '[]'$ Refused Recommended Disposition /'[]'$ [facility]/ '[x]'$  Follow-up with PCP ?Additional Notes: Pt requesting appt as soon as possible. Next available 10/13/21. Please advise. Care advise given, pt verbalizes understanding.  ? ? ? Reason for Disposition ? [1] Evening (after bedtime snack) blood glucose < 100 mg/dL (5.6 mmol/L) AND [2] more than once in past week ? ?Answer Assessment - Initial Assessment Questions ?1. SYMPTOMS: "What symptoms are you concerned about?" ?    Low blood sugars, hallucinations  ?2. ONSET:  "When did the symptoms start?" ?    Months ago ?3. BLOOD GLUCOSE: "What is your blood glucose level?"  ?    80-115 ?4. USUAL RANGE: "What is your blood glucose level usually?" (e.g., usual fasting morning value, usual evening value) ?    "Always low" ?5. TYPE 1 or 2:  "Do you know what type of diabetes you have?"  (e.g., Type 1, Type 2, Gestational; doesn't know)  ?     ?6. INSULIN: "Do you take insulin?" "What type of insulin(s) do you use? What is the mode of delivery? (syringe, pen; injection or pump) "When did you last give yourself an insulin dose?" (i.e., time or hours/minutes ago) "How much did you give?" (i.e., how many units) ?    As ordered ?7. DIABETES PILLS: "Do you take any pills for your diabetes?" ?     ?8. OTHER SYMPTOMS: "Do you have any symptoms?" (e.g., fever, frequent urination, difficulty breathing, vomiting) ?    Fatigue ?9. LOW BLOOD GLUCOSE  TREATMENT: "What have you done so far to treat the low blood glucose level?" ?    NA ?10. FOOD: "When did you last eat or drink?" ?      NA ?11. ALONE: "Are you alone right now or is someone with you?"  ?      Not presently low ? ?Protocols used: Diabetes - Low Blood Sugar-A-AH ? ?

## 2021-09-28 NOTE — Telephone Encounter (Signed)
Sent to PCP ?

## 2021-09-29 ENCOUNTER — Ambulatory Visit: Payer: Medicaid Other | Admitting: Cardiology

## 2021-09-30 ENCOUNTER — Other Ambulatory Visit: Payer: Self-pay

## 2021-10-01 ENCOUNTER — Other Ambulatory Visit: Payer: Self-pay

## 2021-10-01 DIAGNOSIS — Z79899 Other long term (current) drug therapy: Secondary | ICD-10-CM | POA: Diagnosis not present

## 2021-10-04 ENCOUNTER — Telehealth (INDEPENDENT_AMBULATORY_CARE_PROVIDER_SITE_OTHER): Payer: Self-pay | Admitting: Primary Care

## 2021-10-04 NOTE — Telephone Encounter (Signed)
Called patient will schedule appt ?

## 2021-10-04 NOTE — Telephone Encounter (Signed)
Attempter to reach patient to get her scheduled in the office for an appointment. Left a detailed message for her to call the office. ?

## 2021-10-06 ENCOUNTER — Ambulatory Visit (INDEPENDENT_AMBULATORY_CARE_PROVIDER_SITE_OTHER): Payer: Medicaid Other | Admitting: Primary Care

## 2021-10-06 ENCOUNTER — Encounter (INDEPENDENT_AMBULATORY_CARE_PROVIDER_SITE_OTHER): Payer: Self-pay | Admitting: Primary Care

## 2021-10-06 VITALS — BP 132/61 | HR 63 | Temp 98.0°F | Ht 72.0 in

## 2021-10-06 DIAGNOSIS — E782 Mixed hyperlipidemia: Secondary | ICD-10-CM

## 2021-10-06 DIAGNOSIS — E1100 Type 2 diabetes mellitus with hyperosmolarity without nonketotic hyperglycemic-hyperosmolar coma (NKHHC): Secondary | ICD-10-CM

## 2021-10-06 DIAGNOSIS — Z794 Long term (current) use of insulin: Secondary | ICD-10-CM

## 2021-10-06 DIAGNOSIS — I152 Hypertension secondary to endocrine disorders: Secondary | ICD-10-CM

## 2021-10-06 DIAGNOSIS — Z7409 Other reduced mobility: Secondary | ICD-10-CM

## 2021-10-06 DIAGNOSIS — J449 Chronic obstructive pulmonary disease, unspecified: Secondary | ICD-10-CM

## 2021-10-06 DIAGNOSIS — Z760 Encounter for issue of repeat prescription: Secondary | ICD-10-CM | POA: Diagnosis not present

## 2021-10-06 DIAGNOSIS — F3340 Major depressive disorder, recurrent, in remission, unspecified: Secondary | ICD-10-CM | POA: Diagnosis not present

## 2021-10-06 LAB — POCT GLYCOSYLATED HEMOGLOBIN (HGB A1C): Hemoglobin A1C: 7.5 % — AB (ref 4.0–5.6)

## 2021-10-06 MED ORDER — AMLODIPINE BESYLATE 5 MG PO TABS: 5.0000 mg | ORAL_TABLET | Freq: Every day | ORAL | 1 refills | Status: DC

## 2021-10-06 MED ORDER — BUDESONIDE-FORMOTEROL FUMARATE 160-4.5 MCG/ACT IN AERO: 2.0000 | INHALATION_SPRAY | Freq: Two times a day (BID) | RESPIRATORY_TRACT | 3 refills | Status: DC

## 2021-10-06 MED ORDER — LANTUS SOLOSTAR 100 UNIT/ML ~~LOC~~ SOPN: 20.0000 [IU] | PEN_INJECTOR | Freq: Two times a day (BID) | SUBCUTANEOUS | 3 refills | Status: DC

## 2021-10-06 MED ORDER — CITALOPRAM HYDROBROMIDE 40 MG PO TABS: 40.0000 mg | ORAL_TABLET | Freq: Every day | ORAL | 1 refills | Status: DC

## 2021-10-06 MED ORDER — METFORMIN HCL 1000 MG PO TABS: 1000.0000 mg | ORAL_TABLET | Freq: Two times a day (BID) | ORAL | 1 refills | Status: DC

## 2021-10-06 NOTE — Progress Notes (Signed)
? ? [place] ?Rhonda Hickman suffers from  which impairs their ability to perform daily activities like bathing, dressing, and grooming in the home.  A cane and/or walker will not resolve  ?issue with performing activities of daily living. A wheelchair will allow patient to safely perform daily activities. Patient can safely propel the wheelchair in the home or has a caregiver who can provide assistance. Patient has No headache, No chest pain, No abdominal pain - No Nausea, No new weakness tingling or numbness, No Cough - shortness of breath ? ?Comprehensive ROS Pertinent positive and negative noted in HPI   ?Physical exam: ?General: Vital signs reviewed.  Patient is well-developed and well-nourished, obese female  in no acute distress and cooperative with exam. ?Head: Normocephalic and atraumatic. ?Eyes: EOMI, conjunctivae normal, no scleral icterus. ?Neck: Supple, trachea midline, normal ROM, no JVD, masses, thyromegaly, or carotid bruit present. ?Cardiovascular: RRR, S1 normal, S2 normal, no murmurs, gallops, or rubs. ?Pulmonary/Chest: Clear to auscultation bilaterally, no wheezes, rales, or rhonchi. ?Abdominal: Soft, non-tender, non-distended, BS +, no masses, organomegaly, or guarding present. ?Musculoskeletal: LE weakness ?Extremities: No lower extremity edema bilaterally,  pulses symmetric and intact bilaterally. No cyanosis or clubbing. ?Neurological: A&O x3, Strength is normal ?Skin: Warm, dry and intact. No rashes or erythema. ?Psychiatric: Normal mood and affect. speech and behavior is normal. Cognition and memory are normal. ?   ?George was seen today for diabetes. ?Diagnoses and all orders for this visit: ? ?Decreased mobility and endurance ?Dasja Brase is using a wheel chair for mobility. She has a decline in gait and high risk for falls. The only way she is able to move independently is via wheelchair. Her home is not wheelchair accessible. If she had a ramp she would be  able to enter and exit her home. ?Accessories: Duration of need:life time  ?Associated Visit Diagnoses: ? ? Type 2 diabetes mellitus with 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, with long-term current use of insulin ([place]) ?A1C 7.5 previously 8.9  Continue to monitor foods that are high in carbohydrates are the following rice, potatoes, breads, sugars, and pastas.  Reduction in the intake (eating) will assist in lowering your blood sugars.  ? HgB A1c ? ?Continue   insulin glargine (LANTUS SOLOSTAR) 100 UNIT/ML Solostar Pen; Inject 20 Units into the skin 2 (two) times daily.    metFORMIN (GLUCOPHAGE) 1000 MG tablet; Take 1 tablet (1,000 mg total) by mouth 2 (two) times daily with a meal. ? ?-     ?Mixed hyperlipidemia ? Healthy lifestyle diet of fruits vegetables fish nuts whole grains and low saturated fat . Foods high in cholesterol or liver, fatty meats,cheese, butter avocados, nuts and seeds, chocolate and fried foods. ? ?Medication refill ?-     amLODipine (NORVASC) 5 MG tablet; Take 1 tablet (5 mg total) by mouth daily. ?-     budesonide-formoterol (SYMBICORT) 160-4.5 MCG/ACT inhaler; Inhale 2 puffs into the lungs 2 (two) times daily. ?-     citalopram (CELEXA) 40 MG tablet; Take 1 tablet (40 mg total) by mouth daily. ?-     insulin glargine (LANTUS SOLOSTAR) 100 UNIT/ML Solostar Pen; Inject 20 Units into the skin 2 (two) times daily. ?-     metFORMIN (GLUCOPHAGE) 1000 MG tablet; Take 1 tablet (1,000 mg total) by mouth 2 (two) times daily with a meal. ? ?Hypertension due to endocrine disorder ?Controlled < 130/80. ,continue to follow low-sodium, DASH diet, medication compliance, 150 minutes of moderate intensity exercise per week.- chair excercises  ?  Discussed medication compliance, adverse effects.  ?-     amLODipine (NORVASC) 5 MG tablet; Take 1 tablet (5 mg total) by mouth daily. ? ?COPD mixed type ([place]) ?-     budesonide-formoterol (SYMBICORT) 160-4.5 MCG/ACT inhaler; Inhale 2 puffs into the lungs 2 (two) times  daily. ? ?Recurrent major depressive disorder, in remission ([place]) ?Also followed by mental health- Previously left message about hallucination advise to f/u with psyche ?-     citalopram (CELEXA) 40 MG tablet; Take 1 tablet (40 mg total) by mouth daily. ? ?  ? ? ?This note has been created with Surveyor, quantity. Any transcriptional errors are unintentional.  ? ?[name], NP ?10/06/2021, 3:51 PM  ?

## 2021-10-11 ENCOUNTER — Other Ambulatory Visit (INDEPENDENT_AMBULATORY_CARE_PROVIDER_SITE_OTHER): Payer: Self-pay | Admitting: Primary Care

## 2021-10-11 DIAGNOSIS — E1100 Type 2 diabetes mellitus with hyperosmolarity without nonketotic hyperglycemic-hyperosmolar coma (NKHHC): Secondary | ICD-10-CM

## 2021-10-12 DIAGNOSIS — F329 Major depressive disorder, single episode, unspecified: Secondary | ICD-10-CM | POA: Diagnosis not present

## 2021-10-14 ENCOUNTER — Other Ambulatory Visit (HOSPITAL_COMMUNITY): Payer: Self-pay | Admitting: Neurosurgery

## 2021-10-14 ENCOUNTER — Other Ambulatory Visit: Payer: Self-pay | Admitting: Neurosurgery

## 2021-10-14 DIAGNOSIS — M5416 Radiculopathy, lumbar region: Secondary | ICD-10-CM

## 2021-10-15 ENCOUNTER — Other Ambulatory Visit (HOSPITAL_COMMUNITY): Payer: Self-pay | Admitting: Nurse Practitioner

## 2021-10-25 NOTE — Progress Notes (Signed)
? ?Chief Complaint  ?Patient presents with  ? Follow-up  ?  Rm 13. Alone. ?Pt c/o continued neuropathy. States she is unable to walk due to feet feeling numb.  ? ?HISTORICAL ? ?Rhonda Hickman is a 66 year old female, seen in request by her cardiologist Dr. [name], and the primary care nurse practitioner [name] P for evaluation of dizziness, related to positional change, I saw her previously in March 2020, she is accompanied by her friend at today's clinical visit. ? ?I reviewed and summarized the referring note.  Past medical history ?Hypertension ?Diabetes, insulin-dependent, poorly controlled, A1c 8.3 ?Hyperlipidemia ?Bipolar disorder, on Depakote 1000 mg daily, Celexa 40 mg daily, ?Atrial fibrillation, taking Xarelto ? ?Previous evaluation after she fell on same level ground, passed out, bumped her head on the concrete floor, also described erratic behavior during interview, she also complains of headaches ?I personally reviewed CT head without contrast September 2021:No acute intracranial abnormality, mild supratentorium small vessel disease ? ?MRI of the brain in January 2021, motion degraded examination, there was no positive DWI lesion, moderate patchy T2/flair hyperdensity at cerebral white matter, consistent with chronic small vessel disease ?  ?Echocardiogram, ejection fraction 60 to 65%, no regional wall motion abnormality. ?  ?Laboratory evaluations in 2021, CMP showed elevated creatinine 1.4, glucose of 150, CBC showed hemoglobin of 11.9, mild elevated RDW of 15.6, A1c 8.9, was 11.5 in November 2019, has been persistently above 8.5 over the past couple years, lipid panel triglyceride 161, LDL 101, negative HIV, normal B12, TSH ? ?During today's interview, she falling to sleep during conversation, blood pressure lying down 145/78, heart rate of 49; sitting up 127/84, heart rate of 66; standing 160/90, 72 ?Patient is a poor historian, complains of dizziness all the time,  regardless of position, it can happen lying down, sitting up and standing position, ? ?Update December 10, 2020 SS: Here today alone, mentions intermittent swelling in left hand, now in the right hand several months. Takes gabapentin for neuropathy in feet, ortho sent for therapy for right hand, she didn't go. Living alone, got here by medical transportation. Wants to get knee replacement, too many risk factors right now. Dizziness is better, but if she bends over, does feel dizzy like she may pass out, but nothing recent. Mood not doing well, ups and downs. Claims past due to see psych, Depakote ran out for 1 month. Trying to stop smoking. ? ?In November 2021, extensive laboratory evaluation (drug screen, sed rate, CRP, B12, RPR, ANA, vitamin D, multiple myeloma panel, Depakote) were unremarkable. ? ?MRI of the brain in December 2021 showed mild age-related changes, no acute abnormalities. ? ?In November 2021, A1c was 10.5, 10.7 in February 2022, 9.4 in May 2022 ? ?Less than 39% stenosis of bilateral internal carotid artery July 2021 ? ?On Xarelto for history AFIB. Saw Cardiology Jan 2022, dizziness unclear cause, orthostatics negative, Zio patch 3 days Nov 2021, unremarkable. Sleep study showed mild OSA, no indication for CPAP at this time. ? ?Update October 26, 2021 SS: Had left knee replacement in November, had follow-up in Feb, felt more trouble walking , sent to see neurosurgery Dr. Marcello Moores, having MRI Lumbar Spine this week, because left leg is weak. Use wheelchair or walker. A1C down to 7.5. Stopped taking gabapentin, made her unsteady. In hospital Nov 2022 for AFIB RVR. Has numbness, shooting pain to bottoms of feet. Continues to have frequent falls. ? ?REVIEW OF SYSTEMS: Full 14 system review of systems performed and notable only  for as above ? ?See HPI ? ?ALLERGIES: ?Allergies  ?Allergen Reactions  ? Aspirin Nausea Only and Other (See Comments)  ?  Causes stomach pain  ? ? ?HOME MEDICATIONS: ?Current Outpatient  Medications  ?Medication Sig Dispense Refill  ? ACCU-CHEK GUIDE test strip CHECK BLOOD SUGAR UP TO FOUR TIMES DAILY AS DIRECTED 100 strip 0  ? albuterol (VENTOLIN HFA) 108 (90 Base) MCG/ACT inhaler INHALE 2 PUFFS EVERY 6 HOURS AS NEEDED FOR WHEEZING OR SHORTNESS OF BREATH 18 g 1  ? amLODipine (NORVASC) 5 MG tablet Take 1 tablet (5 mg total) by mouth daily. 90 tablet 1  ? blood glucose meter kit and supplies Dispense based on patient and insurance preference. Use up to four times daily as directed. (FOR ICD-10 E10.9, E11.9). 1 each 0  ? budesonide-formoterol (SYMBICORT) 160-4.5 MCG/ACT inhaler Inhale 2 puffs into the lungs 2 (two) times daily. 1 each 3  ? buPROPion (WELLBUTRIN SR) 150 MG 12 hr tablet Take one tablet daily for 3 days. Then, take one tablet twice daily for 3 months. 180 tablet 2  ? calcium-vitamin D (OSCAL WITH D) 500-200 MG-UNIT tablet TAKE (1) TABLET DAILY WITH BREAKFAST. 90 tablet 1  ? citalopram (CELEXA) 40 MG tablet Take 1 tablet (40 mg total) by mouth daily. 90 tablet 1  ? divalproex (DEPAKOTE ER) 500 MG 24 hr tablet Take 1-2 tablets by mouth 2 (two) times daily. One tablet in the morning and 2 tablets at bedtime    ? FEROSUL 325 (65 Fe) MG tablet TAKE (1) TABLET DAILY WITH BREAKFAST. 90 tablet 0  ? fluticasone (FLONASE) 50 MCG/ACT nasal spray Place 2 sprays into both nostrils daily. 16 g 6  ? GNP ULTICARE PEN NEEDLES 32G X 4 MM MISC USE TO INJECT INSULIN UP TO 5 TIMES A DAY 200 each 6  ? insulin glargine (LANTUS SOLOSTAR) 100 UNIT/ML Solostar Pen Inject 20 Units into the skin 2 (two) times daily. 15 mL 3  ? Insulin Syringe-Needle U-100 (ADVOCATE INSULIN SYRINGE) 29G X 1/2" 0.3 ML MISC Use to inject insulin 5x daily. 200 each 6  ? metFORMIN (GLUCOPHAGE) 1000 MG tablet Take 1 tablet (1,000 mg total) by mouth 2 (two) times daily with a meal. 180 tablet 1  ? oxybutynin (DITROPAN) 5 MG tablet TAKE 1 TABLET 2 TO 3 TIMES A DAY FOR BLADDER SPASMS 270 tablet 1  ? oxyCODONE-acetaminophen (PERCOCET)  7.5-325 MG tablet Take 1 tablet by mouth every 4 (four) hours as needed for severe pain. 30 tablet 0  ? PEG-KCl-NaCl-NaSulf-Na Asc-C (PLENVU) 140 g SOLR Take 140 g by mouth as directed. Manufacturer's coupon Universal coupon code:BIN: P2366821; GROUP: [id number]; PCN: [id number]; ID: [id number]; PAY NO MORE $50 1 each 0  ? rivaroxaban (XARELTO) 20 MG TABS tablet TAKE ONE TABLET BY MOUTH WITH SUPPER 90 tablet 2  ? rosuvastatin (CRESTOR) 10 MG tablet Take 1 tablet (10 mg total) by mouth daily. KEEP OV. 90 tablet 0  ? sitaGLIPtin (JANUVIA) 100 MG tablet Take 1 tablet (100 mg total) by mouth daily. 90 tablet 1  ? metoprolol tartrate (LOPRESSOR) 50 MG tablet Take 1 tablet (50 mg total) by mouth 2 (two) times daily. 60 tablet 0  ? ?No current facility-administered medications for this visit.  ? ? ?PAST MEDICAL HISTORY: ?Past Medical History:  ?Diagnosis Date  ? Anemia   ? Anxiety   ? Asthma   ? Atrial fibrillation ([place])   ? Bipolar 1 disorder ([place])   ? Bulging lumbar disc   ?  Chronic pain of left knee   ? COPD (chronic obstructive pulmonary disease) ([place])   ? CVA (cerebral vascular accident) [facility])   ? Diabetes mellitus   ? Neuropathy   ? TIA (transient ischemic attack)   ? Vertigo   ? ? ?PAST SURGICAL HISTORY: ?Past Surgical History:  ?Procedure Laterality Date  ? ABDOMINAL HYSTERECTOMY    ? CARPAL TUNNEL RELEASE Right   ? CHOLECYSTECTOMY    ? KNEE ARTHROSCOPY WITH LATERAL MENISECTOMY Left 06/08/2021  ? Procedure: KNEE ARTHROSCOPY WITH LATERAL MENISCECTOMY AND MEDIAL MENISCECTOMY;  Surgeon: Carole Civil, MD;  Location: [facility];  Service: Orthopedics;  Laterality: Left;  ? KNEE ARTHROSCOPY WITH MEDIAL MENISECTOMY Right 04/14/2016  ? Procedure: KNEE ARTHROSCOPY WITH MEDIAL AND LATERAL MENISECTOMY, MICROFRACTURE REPAIR;  Surgeon: Carole Civil, MD;  Location: [facility];  Service: Orthopedics;  Laterality: Right;  lateral menisectomy - needs crutch training  ? ? ?FAMILY HISTORY: ?Family History  ?Problem Relation Age of Onset   ? Diabetes Mother   ? Heart attack Father   ? ? ?SOCIAL HISTORY: ?Social History  ? ?Socioeconomic History  ? Marital status: Single  ?  Spouse name: Not on file  ? Number of children: 0  ? Years of education:

## 2021-10-26 ENCOUNTER — Ambulatory Visit: Payer: Medicaid Other | Admitting: Neurology

## 2021-10-26 ENCOUNTER — Encounter: Payer: Self-pay | Admitting: Neurology

## 2021-10-26 VITALS — BP 105/74 | HR 70 | Ht 72.0 in

## 2021-10-26 DIAGNOSIS — R269 Unspecified abnormalities of gait and mobility: Secondary | ICD-10-CM

## 2021-10-26 NOTE — Patient Instructions (Signed)
Keep scheduled MRI this week, discuss findings with Dr. Marcello Moores, let us know results and and plan, then we can go from there ? ?Keep next follow-up appointment open  ? ? ?

## 2021-10-28 ENCOUNTER — Ambulatory Visit (HOSPITAL_COMMUNITY)
Admission: RE | Admit: 2021-10-28 | Discharge: 2021-10-28 | Disposition: A | Discharge status: Home / Self Care | Payer: Medicaid Other | Source: Ambulatory Visit | Attending: Neurosurgery | Admitting: Neurosurgery

## 2021-10-28 DIAGNOSIS — M5416 Radiculopathy, lumbar region: Secondary | ICD-10-CM | POA: Diagnosis not present

## 2021-10-28 DIAGNOSIS — M545 Low back pain, unspecified: Secondary | ICD-10-CM | POA: Diagnosis not present

## 2021-10-28 DIAGNOSIS — M79605 Pain in left leg: Secondary | ICD-10-CM | POA: Diagnosis not present

## 2021-10-29 DIAGNOSIS — M25562 Pain in left knee: Secondary | ICD-10-CM | POA: Diagnosis not present

## 2021-10-29 DIAGNOSIS — M5137 Other intervertebral disc degeneration, lumbosacral region: Secondary | ICD-10-CM | POA: Diagnosis not present

## 2021-10-29 DIAGNOSIS — M2392 Unspecified internal derangement of left knee: Secondary | ICD-10-CM | POA: Diagnosis not present

## 2021-10-29 DIAGNOSIS — Z79899 Other long term (current) drug therapy: Secondary | ICD-10-CM | POA: Diagnosis not present

## 2021-11-06 IMAGING — DX DG HAND COMPLETE 3+V*L*
3 series · 3 of 3 positions shown · non-contrast
Comparison: No recent.

CLINICAL DATA: Swelling left hand.

EXAM:
LEFT HAND - COMPLETE 3+ VIEW

[hand pa]
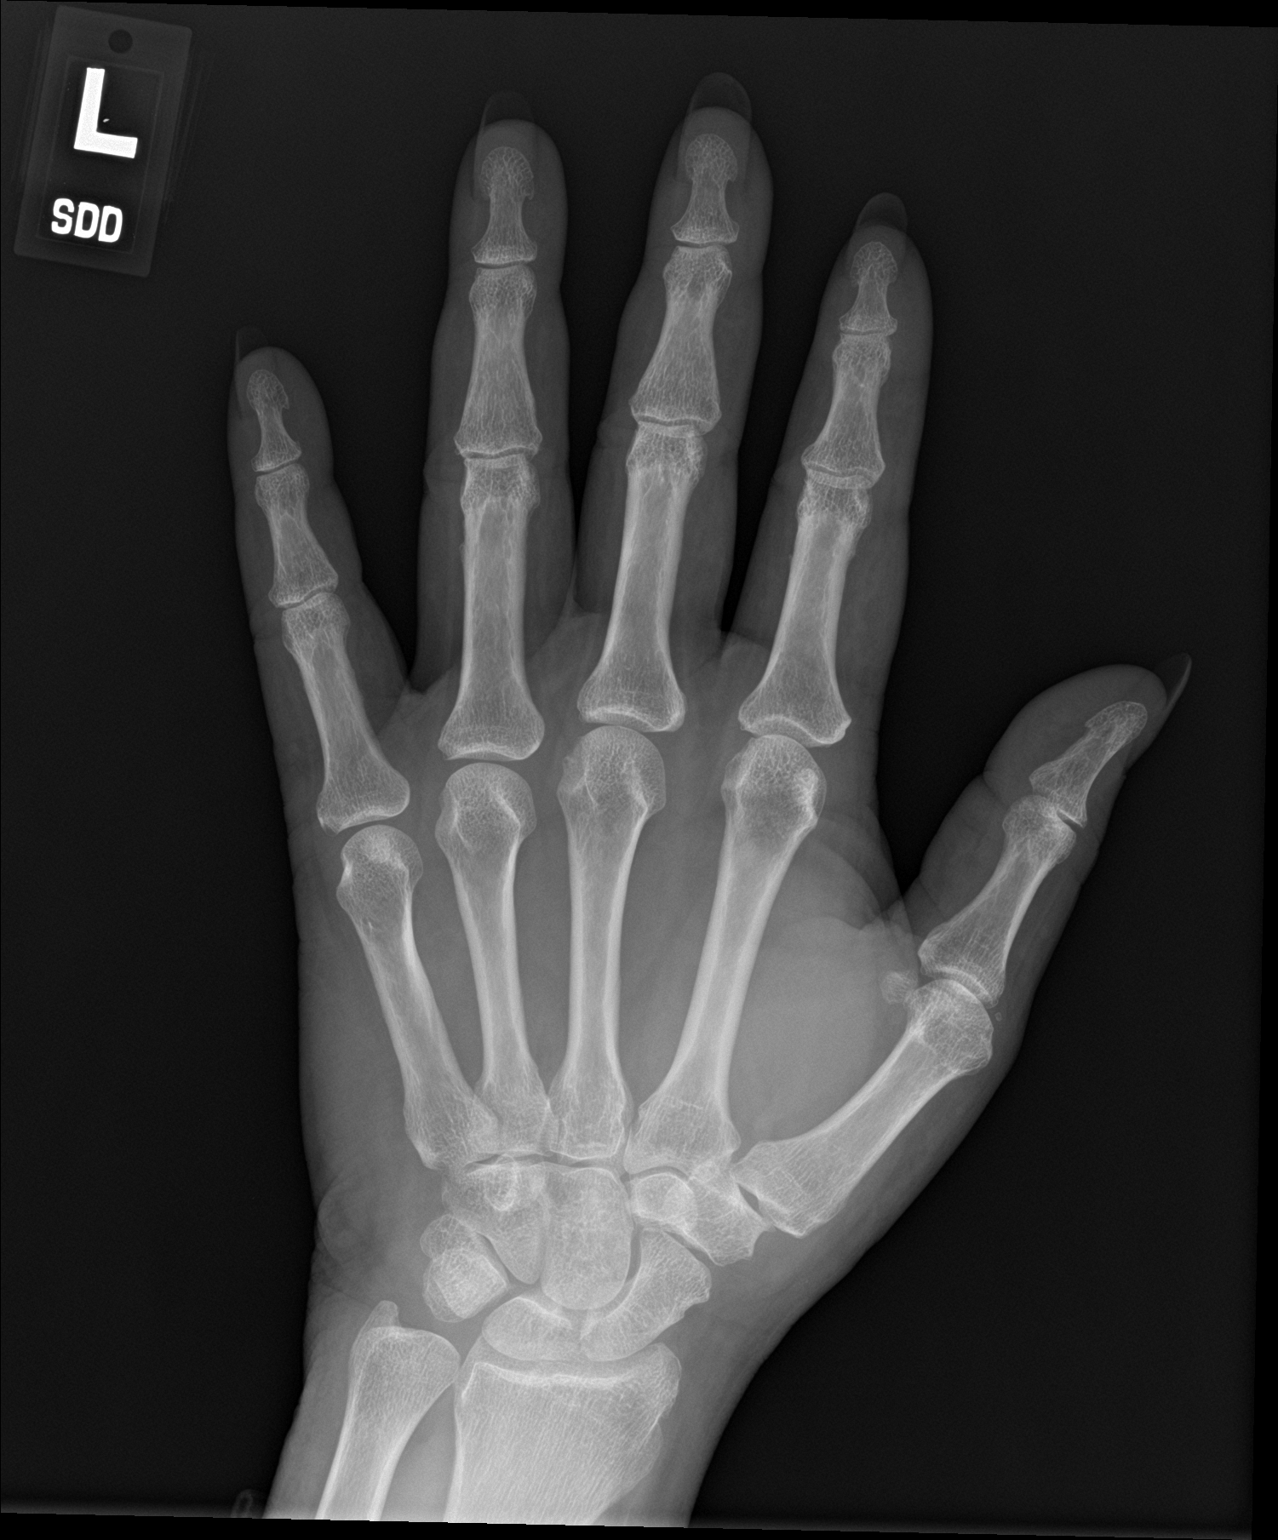

[hand obl]
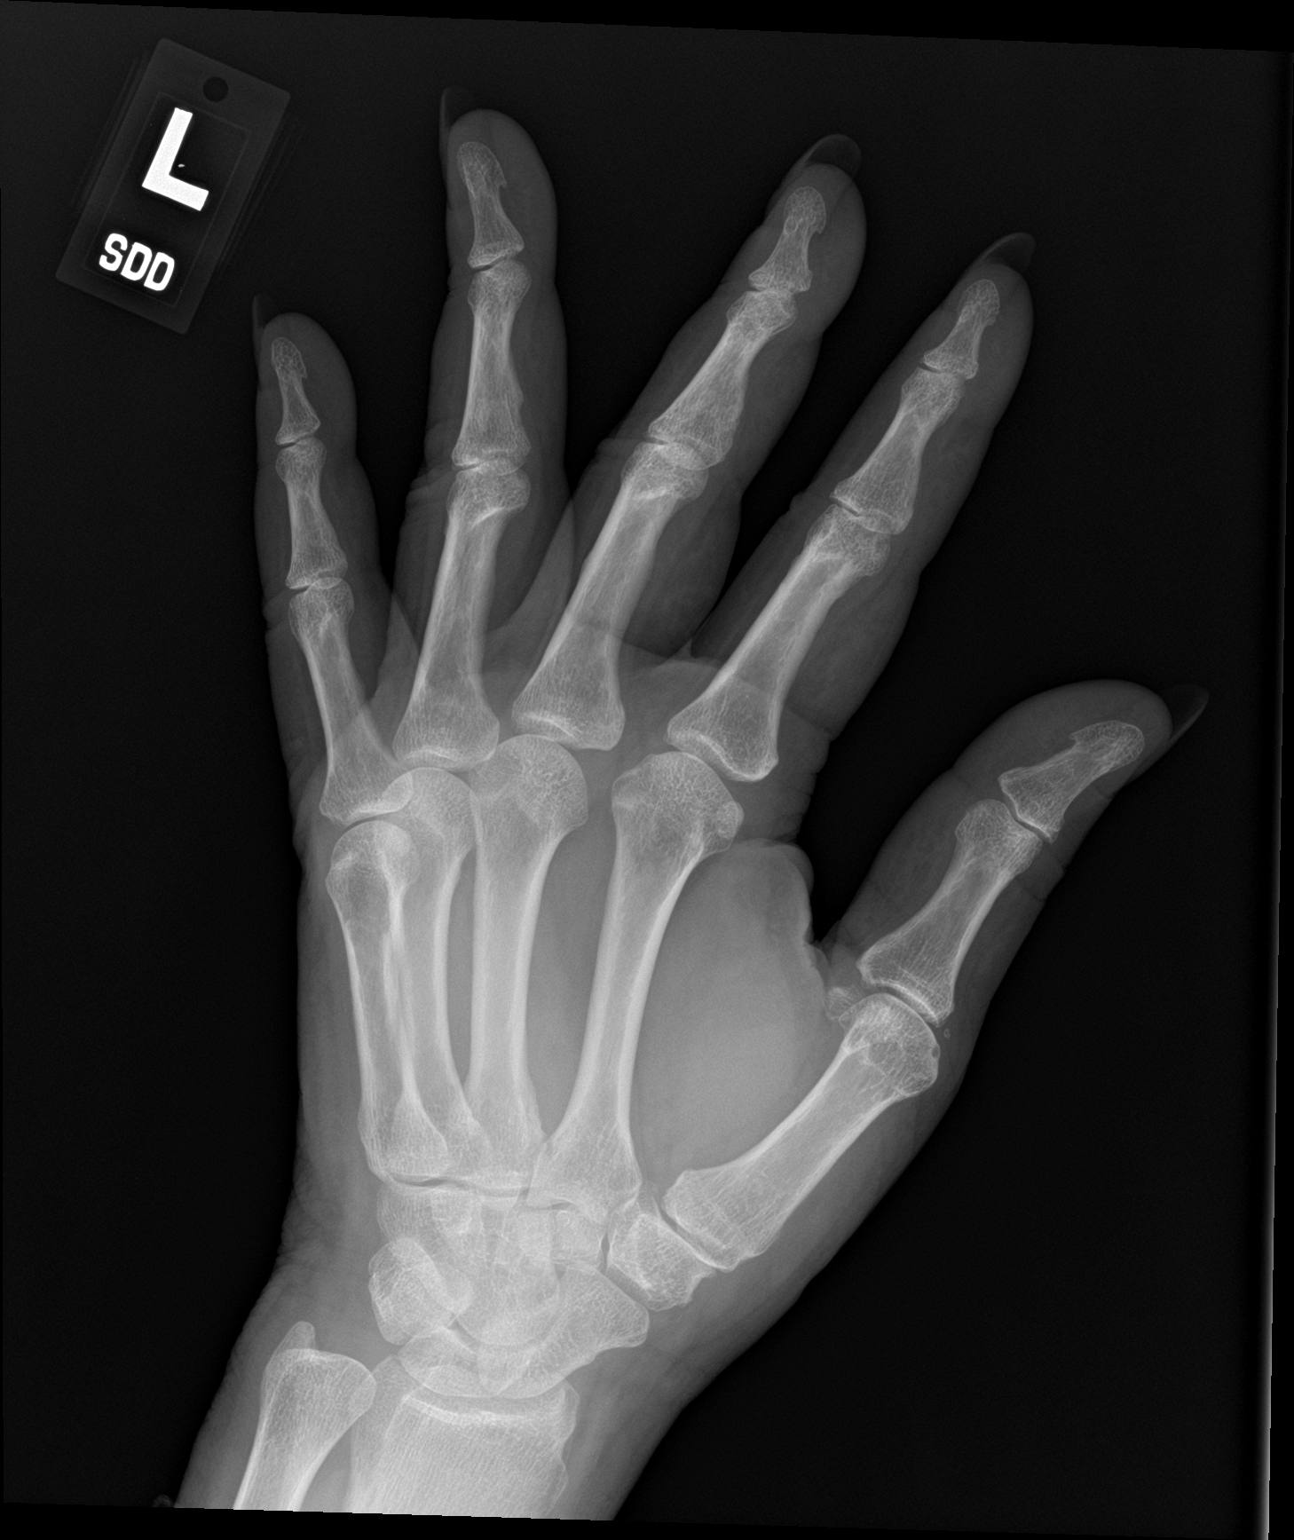

[hand lat]
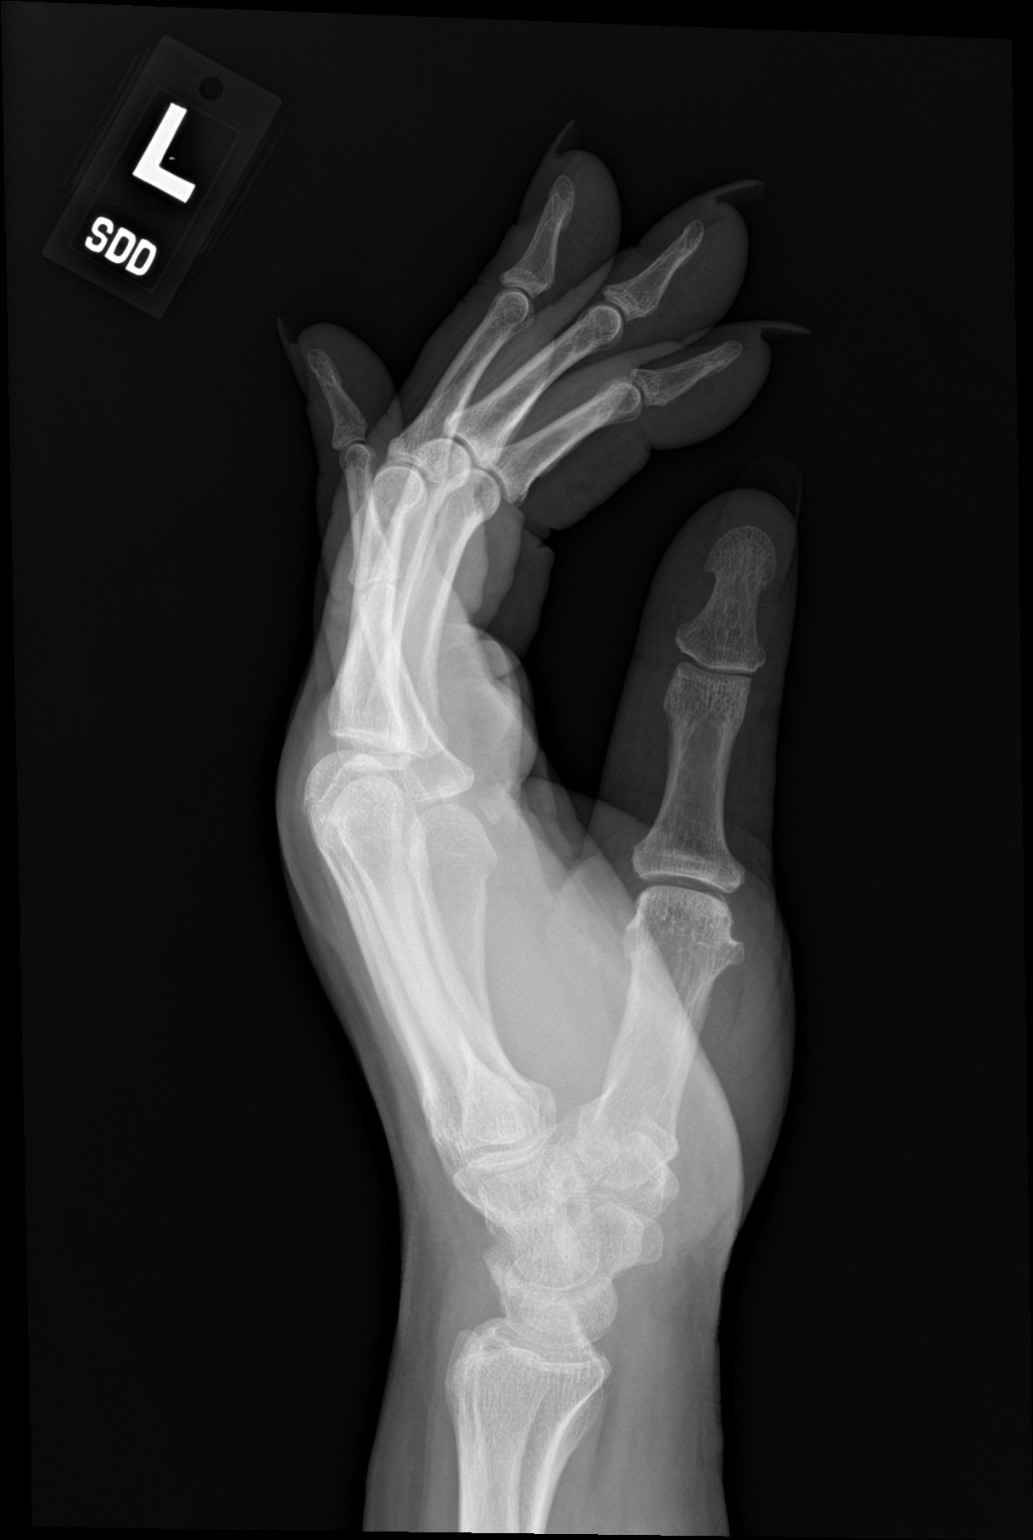

[3 of 3 positions shown; findings below may reference images not displayed]

FINDINGS: No acute bony or joint abnormality identified. No evidence of
fracture or dislocation. No significant arthropathy. No radiopaque
foreign body.
IMPRESSION: No acute abnormality identified.

## 2021-11-15 DIAGNOSIS — M5416 Radiculopathy, lumbar region: Secondary | ICD-10-CM | POA: Diagnosis not present

## 2021-11-20 DIAGNOSIS — Z79899 Other long term (current) drug therapy: Secondary | ICD-10-CM | POA: Diagnosis not present

## 2021-11-25 ENCOUNTER — Ambulatory Visit: Payer: Medicaid Other | Admitting: Orthopedic Surgery

## 2021-11-28 DIAGNOSIS — M25562 Pain in left knee: Secondary | ICD-10-CM | POA: Diagnosis not present

## 2021-11-28 DIAGNOSIS — M2392 Unspecified internal derangement of left knee: Secondary | ICD-10-CM | POA: Diagnosis not present

## 2021-11-28 DIAGNOSIS — M5137 Other intervertebral disc degeneration, lumbosacral region: Secondary | ICD-10-CM | POA: Diagnosis not present

## 2021-12-02 ENCOUNTER — Other Ambulatory Visit (INDEPENDENT_AMBULATORY_CARE_PROVIDER_SITE_OTHER): Payer: Self-pay | Admitting: Primary Care

## 2021-12-02 DIAGNOSIS — E1100 Type 2 diabetes mellitus with hyperosmolarity without nonketotic hyperglycemic-hyperosmolar coma (NKHHC): Secondary | ICD-10-CM

## 2021-12-03 ENCOUNTER — Other Ambulatory Visit (INDEPENDENT_AMBULATORY_CARE_PROVIDER_SITE_OTHER): Payer: Self-pay | Admitting: Primary Care

## 2021-12-03 DIAGNOSIS — Z760 Encounter for issue of repeat prescription: Secondary | ICD-10-CM

## 2021-12-03 DIAGNOSIS — E1100 Type 2 diabetes mellitus with hyperosmolarity without nonketotic hyperglycemic-hyperosmolar coma (NKHHC): Secondary | ICD-10-CM

## 2021-12-03 DIAGNOSIS — E782 Mixed hyperlipidemia: Secondary | ICD-10-CM

## 2021-12-03 DIAGNOSIS — I152 Hypertension secondary to endocrine disorders: Secondary | ICD-10-CM

## 2021-12-03 NOTE — Telephone Encounter (Signed)
Patient new pharmacy as per [facility] address:  [facility] - [place], [address] Phone:  [phone]  Fax:  [phone]

## 2021-12-03 NOTE — Telephone Encounter (Signed)
Medication Refill - Medication: ACCU-CHEK GUIDE test strip [[id number]]  metFORMIN (GLUCOPHAGE) 1000 MG tablet [[id number]]  FEROSUL 325 (65 Fe) MG tablet [[id number]]  amLODipine (NORVASC) 5 MG tablet [[id number]]  rivaroxaban (XARELTO) 20 MG TABS tablet [[id number]]        Has the patient contacted their pharmacy? No. [place]  [facility] [id number]   Has the patient been seen for an appointment in the last year OR does the patient have an upcoming appointment? Yes.    Agent: Please be advised that RX refills may take up to 3 business days. We ask that you follow-up with your pharmacy.

## 2021-12-07 MED ORDER — ACCU-CHEK GUIDE VI STRP: ORAL_STRIP | 11 refills | Status: DC

## 2021-12-07 MED ORDER — METFORMIN HCL 1000 MG PO TABS: 1000.0000 mg | ORAL_TABLET | Freq: Two times a day (BID) | ORAL | 0 refills | Status: DC

## 2021-12-07 NOTE — Telephone Encounter (Signed)
Requested Prescriptions  Pending Prescriptions Disp Refills  . glucose blood (ACCU-CHEK GUIDE) test strip 100 strip 11    Sig: Use as instructed     Endocrinology: Diabetes - Testing Supplies Passed - 12/03/2021  5:32 PM      Passed - Valid encounter within last 12 months    Recent Outpatient Visits          2 months ago Type 2 diabetes mellitus with 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, with long-term current use of insulin ([place])   [place] [name] P, NP   4 months ago Type 2 diabetes mellitus with 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, without long-term current use of insulin ([place])   [place], Michelle P, NP   9 months ago Type 2 diabetes mellitus with 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, with long-term current use of insulin ([place])   [place] [name] P, NP   12 months ago Type 2 diabetes mellitus with 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, with long-term current use of insulin ([place])   [place] [name] P, NP   1 year ago Type 2 diabetes mellitus with 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, with long-term current use of insulin ([place])   [place], [place], NP      Future Appointments            In 1 month Oletta Lamas, Milford Cage, NP [place]   In 1 month [name], MD [facility]           . ferrous sulfate (FEROSUL) 325 (65 FE) MG tablet 90 tablet 0     Endocrinology:  Minerals - Iron Supplementation Failed - 12/03/2021  5:32 PM      Failed - HGB in normal range and within 360 days    Hemoglobin  Date Value Ref Range Status  05/24/2021 10.7 (L) 12.0 - 15.0 g/dL Final  08/05/2020 12.8 11.1 - 15.9 g/dL Final         Failed - HCT in normal range and within 360 days    HCT  Date Value Ref Range Status  05/24/2021 32.1 (L) 36.0 - 46.0 % Final   Hematocrit  Date Value Ref Range Status   08/05/2020 38.9 34.0 - 46.6 % Final         Failed - RBC in normal range and within 360 days    RBC  Date Value Ref Range Status  05/24/2021 3.62 (L) 3.87 - 5.11 MIL/uL Final         Failed - Fe (serum) in normal range and within 360 days    No results found for: IRON, IRONPCTSAT       Failed - Ferritin in normal range and within 360 days    No results found for: FERRITIN       Passed - Valid encounter within last 12 months    Recent Outpatient Visits          2 months ago Type 2 diabetes mellitus with 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, with long-term current use of insulin ([place])   [place] [facility] [name] P, NP   4 months ago Type 2 diabetes mellitus with 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, without long-term current use of insulin ([place])   [place] [facility] [name], NP   9 months ago Type 2 diabetes mellitus with 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, with long-term current use of insulin ([place])   [place]  [facility] [name], NP   12 months ago Type 2 diabetes mellitus with 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, with long-term current use of insulin ([place])   [place] [name], NP   1 year ago Type 2 diabetes mellitus with 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, with long-term current use of insulin ([place])   [place], Michelle P, NP      Future Appointments            In 1 month [place], Milford Cage, NP [place]   In 1 month [name], MD [facility]           . metFORMIN (GLUCOPHAGE) 1000 MG tablet 180 tablet 1    Sig: Take 1 tablet (1,000 mg total) by mouth 2 (two) times daily with a meal.     Endocrinology:  Diabetes - Biguanides Failed - 12/03/2021  5:32 PM      Failed - Cr in normal range and within 360 days    Creatinine, Ser  Date Value Ref Range Status  05/24/2021 1.04 (H) 0.44 - 1.00 mg/dL  Final   Creatinine, Urine  Date Value Ref Range Status  12/17/2019 128.15 mg/dL Final    Comment:    Performed at [facility], [address], [place], [place] [address]         Passed - HBA1C is between 0 and 7.9 and within 180 days    Hemoglobin A1C  Date Value Ref Range Status  10/06/2021 7.5 (A) 4.0 - 5.6 % Final   Hgb A1c MFr Bld  Date Value Ref Range Status  05/22/2021 8.9 (H) 4.8 - 5.6 % Final    Comment:    (NOTE) Pre diabetes:          5.7%-6.4%  Diabetes:              >6.4%  Glycemic control for   <7.0% adults with diabetes          Passed - eGFR in normal range and within 360 days    GFR calc Af Amer  Date Value Ref Range Status  03/17/2020 >60 >60 mL/min Final   GFR, Estimated  Date Value Ref Range Status  05/24/2021 >60 >60 mL/min Final    Comment:    (NOTE) Calculated using the CKD-EPI Creatinine Equation (2021)    eGFR  Date Value Ref Range Status  03/10/2021 61 >59 mL/min/1.73 Final         Passed - B12 Level in normal range and within 720 days    Vitamin B-12  Date Value Ref Range Status  06/02/2020 460 232 - 1,245 pg/mL Final         Passed - Valid encounter within last 6 months    Recent Outpatient Visits          2 months ago Type 2 diabetes mellitus with 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, with long-term current use of insulin ([place])   [place] [facility] [name] P, NP   4 months ago Type 2 diabetes mellitus with 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, without long-term current use of insulin ([place])   [place] [facility] [name] P, NP   9 months ago Type 2 diabetes mellitus with 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, with long-term current use of insulin ([place])   [facility] [name], NP   12 months ago Type 2 diabetes mellitus with 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,  with long-term current use of insulin ([place])   [place] [name], NP   1 year ago  Type 2 diabetes mellitus with 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, with long-term current use of insulin ([place])   [place] [facility] [name], NP      Future Appointments            In 1 month [place], Milford Cage, NP [place]   In 1 month [name], MD [facility]           Passed - CBC within normal limits and completed in the last 12 months    WBC  Date Value Ref Range Status  05/24/2021 10.2 4.0 - 10.5 K/uL Final   RBC  Date Value Ref Range Status  05/24/2021 3.62 (L) 3.87 - 5.11 MIL/uL Final   Hemoglobin  Date Value Ref Range Status  05/24/2021 10.7 (L) 12.0 - 15.0 g/dL Final  08/05/2020 12.8 11.1 - 15.9 g/dL Final   HCT  Date Value Ref Range Status  05/24/2021 32.1 (L) 36.0 - 46.0 % Final   Hematocrit  Date Value Ref Range Status  08/05/2020 38.9 34.0 - 46.6 % Final   MCHC  Date Value Ref Range Status  05/24/2021 33.3 30.0 - 36.0 g/dL Final   [facility]  Date Value Ref Range Status  05/24/2021 29.6 26.0 - 34.0 pg Final   MCV  Date Value Ref Range Status  05/24/2021 88.7 80.0 - 100.0 fL Final  08/05/2020 88 79 - 97 fL Final   No results found for: PLTCOUNTKUC, LABPLAT, POCPLA RDW  Date Value Ref Range Status  05/24/2021 14.2 11.5 - 15.5 % Final  08/05/2020 13.8 11.7 - 15.4 % Final         . amLODipine (NORVASC) 5 MG tablet 90 tablet 1    Sig: Take 1 tablet (5 mg total) by mouth daily.     Cardiovascular: Calcium Channel Blockers 2 Passed - 12/03/2021  5:32 PM      Passed - Last BP in normal range    BP Readings from Last 1 Encounters:  10/26/21 105/74         Passed - Last Heart Rate in normal range    Pulse Readings from Last 1 Encounters:  10/26/21 70         Passed - Valid encounter within last 6 months    Recent Outpatient Visits          2 months ago Type 2 diabetes mellitus with 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, with long-term current use of insulin ([place])   [place] [name] P, NP   4 months ago Type 2 diabetes mellitus with 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, without long-term current use of insulin ([place])   [place], Michelle P, NP   9 months ago Type 2 diabetes mellitus with 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, with long-term current use of insulin ([place])   [place], Michelle P, NP   12 months ago Type 2 diabetes mellitus with 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, with long-term current use of insulin ([place])   [place] [name] P, NP   1 year ago Type 2 diabetes mellitus with 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, with long-term current use of insulin ([place])   [place], Michelle P, NP      Future Appointments  In 1 month Oletta Lamas Milford Cage, NP [place]   In 1 month [name], MD [facility]           . rivaroxaban (XARELTO) 20 MG TABS tablet 90 tablet 2    Sig: TAKE ONE TABLET BY MOUTH WITH SUPPER     Hematology: Anticoagulants - rivaroxaban Failed - 12/03/2021  5:32 PM      Failed - ALT in normal range and within 360 days    ALT  Date Value Ref Range Status  03/10/2021 34 (H) 0 - 32 IU/L Final         Failed - Cr in normal range and within 360 days    Creatinine, Ser  Date Value Ref Range Status  05/24/2021 1.04 (H) 0.44 - 1.00 mg/dL Final   Creatinine, Urine  Date Value Ref Range Status  12/17/2019 128.15 mg/dL Final    Comment:    Performed at [facility], [address], [place], [place] [address]         Failed - HCT in normal range and within 360 days    HCT  Date Value Ref Range Status  05/24/2021 32.1 (L) 36.0 - 46.0 % Final   Hematocrit  Date Value Ref Range Status  08/05/2020 38.9 34.0 - 46.6 % Final         Failed - HGB in normal range and within 360 days    Hemoglobin  Date Value Ref Range Status   05/24/2021 10.7 (L) 12.0 - 15.0 g/dL Final  08/05/2020 12.8 11.1 - 15.9 g/dL Final         Passed - AST in normal range and within 360 days    AST  Date Value Ref Range Status  03/10/2021 20 0 - 40 IU/L Final         Passed - PLT in normal range and within 360 days    Platelets  Date Value Ref Range Status  05/24/2021 205 150 - 400 K/uL Final  08/05/2020 201 150 - 450 x10E3/uL Final         Passed - eGFR is 15 or above and within 360 days    GFR calc Af Amer  Date Value Ref Range Status  03/17/2020 >60 >60 mL/min Final   GFR, Estimated  Date Value Ref Range Status  05/24/2021 >60 >60 mL/min Final    Comment:    (NOTE) Calculated using the CKD-EPI Creatinine Equation (2021)    eGFR  Date Value Ref Range Status  03/10/2021 61 >59 mL/min/1.73 Final         Passed - Patient is not pregnant      Passed - Valid encounter within last 12 months    Recent Outpatient Visits          2 months ago Type 2 diabetes mellitus with 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, with long-term current use of insulin ([place])   [place], Michelle P, NP   4 months ago Type 2 diabetes mellitus with 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, without long-term current use of insulin ([place])   [place] [facility] [name] P, NP   9 months ago Type 2 diabetes mellitus with 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, with long-term current use of insulin ([place])   [place] [facility] [name], NP   12 months ago Type 2 diabetes mellitus with 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, with long-term current use of insulin ([place])   [place]  [name], NP   1 year ago Type 2 diabetes mellitus with 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, with long-term current use of insulin ([place])   [place], [place], NP      Future Appointments            In 1 month Oletta Lamas, Milford Cage, NP [place]    In 1 month [name], MD [facility]

## 2021-12-07 NOTE — Telephone Encounter (Signed)
Requested medication (s) are due for refill today: yes  Requested medication (s) are on the active medication list: yes  Last refill:  09/23/21  Future visit scheduled: yes  Notes to clinic:  Unable to refill per protocol due to failed labs, no updated results.    Requested Prescriptions  Pending Prescriptions Disp Refills   ferrous sulfate (FEROSUL) 325 (65 FE) MG tablet 90 tablet 0     Endocrinology:  Minerals - Iron Supplementation Failed - 12/03/2021  5:32 PM      Failed - HGB in normal range and within 360 days    Hemoglobin  Date Value Ref Range Status  05/24/2021 10.7 (L) 12.0 - 15.0 g/dL Final  08/05/2020 12.8 11.1 - 15.9 g/dL Final         Failed - HCT in normal range and within 360 days    HCT  Date Value Ref Range Status  05/24/2021 32.1 (L) 36.0 - 46.0 % Final   Hematocrit  Date Value Ref Range Status  08/05/2020 38.9 34.0 - 46.6 % Final         Failed - RBC in normal range and within 360 days    RBC  Date Value Ref Range Status  05/24/2021 3.62 (L) 3.87 - 5.11 MIL/uL Final         Failed - Fe (serum) in normal range and within 360 days    No results found for: IRON, IRONPCTSAT       Failed - Ferritin in normal range and within 360 days    No results found for: FERRITIN       Passed - Valid encounter within last 12 months    Recent Outpatient Visits           2 months ago Type 2 diabetes mellitus with 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, with long-term current use of insulin ([place])   [place] [facility] [name] P, NP   4 months ago Type 2 diabetes mellitus with 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, without long-term current use of insulin ([place])   [place] [facility] [name] P, NP   9 months ago Type 2 diabetes mellitus with 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, with long-term current use of insulin ([place])   [place] [facility] [name] P, NP   12 months ago Type 2 diabetes mellitus with hyperosmolarity  without coma, with long-term current use of insulin ([place])   [place] [facility] [name] P, NP   1 year ago Type 2 diabetes mellitus with 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, with long-term current use of insulin ([place])   [place] [facility] [name], NP       Future Appointments             In 1 month [place], Milford Cage, NP [facility]   In 1 month [name], MD [facility]              amLODipine (NORVASC) 5 MG tablet 90 tablet 0    Sig: Take 1 tablet (5 mg total) by mouth daily.     Cardiovascular: Calcium Channel Blockers 2 Passed - 12/03/2021  5:32 PM      Passed - Last BP in normal range    BP Readings from Last 1 Encounters:  10/26/21 105/74         Passed - Last Heart Rate in normal range    Pulse Readings from Last 1 Encounters:  10/26/21  9         Passed - Valid encounter within last 6 months    Recent Outpatient Visits           2 months ago Type 2 diabetes mellitus with 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, with long-term current use of insulin ([place])   [place] [name] P, NP   4 months ago Type 2 diabetes mellitus with 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, without long-term current use of insulin ([place])   [place], Michelle P, NP   9 months ago Type 2 diabetes mellitus with 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, with long-term current use of insulin ([place])   [place], Michelle P, NP   12 months ago Type 2 diabetes mellitus with 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, with long-term current use of insulin ([place])   [place] [name], NP   1 year ago Type 2 diabetes mellitus with 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, with long-term current use of insulin ([place])   [place], Michelle P, NP       Future Appointments             In 1  month [place], Milford Cage, NP [place]   In 1 month [name], MD [facility]              rivaroxaban (XARELTO) 20 MG TABS tablet 90 tablet 0    Sig: TAKE ONE TABLET BY MOUTH WITH SUPPER     Hematology: Anticoagulants - rivaroxaban Failed - 12/03/2021  5:32 PM      Failed - ALT in normal range and within 360 days    ALT  Date Value Ref Range Status  03/10/2021 34 (H) 0 - 32 IU/L Final         Failed - Cr in normal range and within 360 days    Creatinine, Ser  Date Value Ref Range Status  05/24/2021 1.04 (H) 0.44 - 1.00 mg/dL Final   Creatinine, Urine  Date Value Ref Range Status  12/17/2019 128.15 mg/dL Final    Comment:    Performed at [facility], [address] [place], [place] [address]         Failed - HCT in normal range and within 360 days    HCT  Date Value Ref Range Status  05/24/2021 32.1 (L) 36.0 - 46.0 % Final   Hematocrit  Date Value Ref Range Status  08/05/2020 38.9 34.0 - 46.6 % Final         Failed - HGB in normal range and within 360 days    Hemoglobin  Date Value Ref Range Status  05/24/2021 10.7 (L) 12.0 - 15.0 g/dL Final  08/05/2020 12.8 11.1 - 15.9 g/dL Final         Passed - AST in normal range and within 360 days    AST  Date Value Ref Range Status  03/10/2021 20 0 - 40 IU/L Final         Passed - PLT in normal range and within 360 days    Platelets  Date Value Ref Range Status  05/24/2021 205 150 - 400 K/uL Final  08/05/2020 201 150 - 450 x10E3/uL Final         Passed - eGFR is 15 or above and within 360 days    GFR calc Af Amer  Date Value Ref Range Status  03/17/2020 >60 >60 mL/min  Final   GFR, Estimated  Date Value Ref Range Status  05/24/2021 >60 >60 mL/min Final    Comment:    (NOTE) Calculated using the CKD-EPI Creatinine Equation (2021)    eGFR  Date Value Ref Range Status  03/10/2021 61 >59 mL/min/1.73 Final         Passed - Patient is not  pregnant      Passed - Valid encounter within last 12 months    Recent Outpatient Visits           2 months ago Type 2 diabetes mellitus with 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, with long-term current use of insulin ([place])   [place] [name] P, NP   4 months ago Type 2 diabetes mellitus with 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, without long-term current use of insulin ([place])   [place] [name] P, NP   9 months ago Type 2 diabetes mellitus with 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, with long-term current use of insulin ([place])   [place] [name] P, NP   12 months ago Type 2 diabetes mellitus with 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, with long-term current use of insulin ([place])   [place] [name], NP   1 year ago Type 2 diabetes mellitus with 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, with long-term current use of insulin ([place])   [place], [place], NP       Future Appointments             In 1 month [place], Milford Cage, NP [place]   In 1 month [name], MD [facility]             Signed Prescriptions Disp Refills   glucose blood (ACCU-CHEK GUIDE) test strip 100 strip 11    Sig: CHECK BLOOD SUGAR UP TO FOUR TIMES DAILY AS DIRECTED     Endocrinology: Diabetes - Testing Supplies Passed - 12/03/2021  5:32 PM      Passed - Valid encounter within last 12 months    Recent Outpatient Visits           2 months ago Type 2 diabetes mellitus with 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, with long-term current use of insulin ([place])   [place] [name] P, NP   4 months ago Type 2 diabetes mellitus with 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, without long-term current use of insulin ([place])   [place] [name] P, NP   9 months ago Type 2 diabetes  mellitus with 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, with long-term current use of insulin ([place])   [place] [name] P, NP   12 months ago Type 2 diabetes mellitus with 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, with long-term current use of insulin ([place])   [place] [name] P, NP   1 year ago Type 2 diabetes mellitus with 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, with long-term current use of insulin ([place])   [place] [facility] [name], NP       Future Appointments             In 1 month [place], Milford Cage, NP [place]   In 1 month [name], MD [facility]              metFORMIN (GLUCOPHAGE) 1000 MG tablet 180 tablet 0    Sig: Take  1 tablet (1,000 mg total) by mouth 2 (two) times daily with a meal.     Endocrinology:  Diabetes - Biguanides Failed - 12/03/2021  5:32 PM      Failed - Cr in normal range and within 360 days    Creatinine, Ser  Date Value Ref Range Status  05/24/2021 1.04 (H) 0.44 - 1.00 mg/dL Final   Creatinine, Urine  Date Value Ref Range Status  12/17/2019 128.15 mg/dL Final    Comment:    Performed at [facility], [address], [place], [place] [address]         Passed - HBA1C is between 0 and 7.9 and within 180 days    Hemoglobin A1C  Date Value Ref Range Status  10/06/2021 7.5 (A) 4.0 - 5.6 % Final   Hgb A1c MFr Bld  Date Value Ref Range Status  05/22/2021 8.9 (H) 4.8 - 5.6 % Final    Comment:    (NOTE) Pre diabetes:          5.7%-6.4%  Diabetes:              >6.4%  Glycemic control for   <7.0% adults with diabetes          Passed - eGFR in normal range and within 360 days    GFR calc Af Amer  Date Value Ref Range Status  03/17/2020 >60 >60 mL/min Final   GFR, Estimated  Date Value Ref Range Status  05/24/2021 >60 >60 mL/min Final    Comment:    (NOTE) Calculated using the CKD-EPI Creatinine Equation  (2021)    eGFR  Date Value Ref Range Status  03/10/2021 61 >59 mL/min/1.73 Final         Passed - B12 Level in normal range and within 720 days    Vitamin B-12  Date Value Ref Range Status  06/02/2020 460 232 - 1,245 pg/mL Final         Passed - Valid encounter within last 6 months    Recent Outpatient Visits           2 months ago Type 2 diabetes mellitus with 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, with long-term current use of insulin ([place])   [place] [facility] [name] P, NP   4 months ago Type 2 diabetes mellitus with 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, without long-term current use of insulin ([place])   [place] [facility] [name] P, NP   9 months ago Type 2 diabetes mellitus with 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, with long-term current use of insulin ([place])   [place] [name] P, NP   12 months ago Type 2 diabetes mellitus with 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, with long-term current use of insulin ([place])   [place] [facility] [name] P, NP   1 year ago Type 2 diabetes mellitus with 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, with long-term current use of insulin ([place])   [place] [facility] [name], NP       Future Appointments             In 1 month Oletta Lamas, Milford Cage, NP [place]   In 1 month [name], MD [facility]             Passed - CBC within normal limits and completed in the last 12 months    WBC  Date Value Ref Range Status  05/24/2021 10.2 4.0 - 10.5 K/uL  Final   RBC  Date Value Ref Range Status  05/24/2021 3.62 (L) 3.87 - 5.11 MIL/uL Final   Hemoglobin  Date Value Ref Range Status  05/24/2021 10.7 (L) 12.0 - 15.0 g/dL Final  08/05/2020 12.8 11.1 - 15.9 g/dL Final   HCT  Date Value Ref Range Status  05/24/2021 32.1 (L) 36.0 - 46.0 % Final   Hematocrit  Date Value Ref Range Status   08/05/2020 38.9 34.0 - 46.6 % Final   MCHC  Date Value Ref Range Status  05/24/2021 33.3 30.0 - 36.0 g/dL Final   [facility]  Date Value Ref Range Status  05/24/2021 29.6 26.0 - 34.0 pg Final   MCV  Date Value Ref Range Status  05/24/2021 88.7 80.0 - 100.0 fL Final  08/05/2020 88 79 - 97 fL Final   No results found for: PLTCOUNTKUC, LABPLAT, POCPLA RDW  Date Value Ref Range Status  05/24/2021 14.2 11.5 - 15.5 % Final  08/05/2020 13.8 11.7 - 15.4 % Final

## 2021-12-07 NOTE — Telephone Encounter (Signed)
Routed to PCP 

## 2021-12-09 ENCOUNTER — Other Ambulatory Visit: Payer: Self-pay

## 2021-12-09 ENCOUNTER — Ambulatory Visit: Payer: Medicaid Other | Admitting: Orthopedic Surgery

## 2021-12-09 NOTE — Congregational Nurse Program (Signed)
F/u with patient as a result of food security and delivered  senior meals to home.  

## 2021-12-10 ENCOUNTER — Telehealth (INDEPENDENT_AMBULATORY_CARE_PROVIDER_SITE_OTHER): Payer: Self-pay | Admitting: Primary Care

## 2021-12-10 NOTE — Telephone Encounter (Signed)
Copied from [id number]. Topic: General - Other >> Dec 09, 2021  5:19 PM Rhonda Hickman wrote: Reason for CRM: Pt called for update on whether or not Rhonda Hickman has signed the paperwork for her to get her pull-ups and wipes. Pt requests call back. Cb# [phone]

## 2021-12-10 NOTE — Telephone Encounter (Signed)
Patient aware that forms were received and PCP signed off. They have been faxed back to company.

## 2021-12-11 MED ORDER — AMLODIPINE BESYLATE 5 MG PO TABS
5.0000 mg | ORAL_TABLET | Freq: Every day | ORAL | 0 refills | Status: DC
Start: 2021-12-11 — End: 2021-12-15

## 2021-12-11 MED ORDER — RIVAROXABAN 20 MG PO TABS: ORAL_TABLET | ORAL | 0 refills | Status: DC

## 2021-12-11 MED ORDER — FERROUS SULFATE 325 (65 FE) MG PO TABS: ORAL_TABLET | ORAL | 0 refills | Status: DC

## 2021-12-13 ENCOUNTER — Other Ambulatory Visit: Payer: Self-pay | Admitting: Radiology

## 2021-12-13 NOTE — Telephone Encounter (Signed)
Received refill request for Meloxicam but patient can not take with her Xarelto Did not send request to Dr Aline Brochure.

## 2021-12-15 ENCOUNTER — Other Ambulatory Visit (INDEPENDENT_AMBULATORY_CARE_PROVIDER_SITE_OTHER): Payer: Self-pay | Admitting: Primary Care

## 2021-12-15 ENCOUNTER — Telehealth: Payer: Self-pay | Admitting: Orthopedic Surgery

## 2021-12-15 ENCOUNTER — Telehealth: Payer: Self-pay | Admitting: Cardiology

## 2021-12-15 DIAGNOSIS — Z720 Tobacco use: Secondary | ICD-10-CM

## 2021-12-15 DIAGNOSIS — F3340 Major depressive disorder, recurrent, in remission, unspecified: Secondary | ICD-10-CM

## 2021-12-15 DIAGNOSIS — N3942 Incontinence without sensory awareness: Secondary | ICD-10-CM

## 2021-12-15 DIAGNOSIS — I152 Hypertension secondary to endocrine disorders: Secondary | ICD-10-CM

## 2021-12-15 DIAGNOSIS — E782 Mixed hyperlipidemia: Secondary | ICD-10-CM

## 2021-12-15 DIAGNOSIS — Z794 Long term (current) use of insulin: Secondary | ICD-10-CM

## 2021-12-15 DIAGNOSIS — Z760 Encounter for issue of repeat prescription: Secondary | ICD-10-CM

## 2021-12-15 MED ORDER — ROSUVASTATIN CALCIUM 10 MG PO TABS: 10.0000 mg | ORAL_TABLET | Freq: Every day | ORAL | 1 refills | Status: DC

## 2021-12-15 MED ORDER — METOPROLOL TARTRATE 50 MG PO TABS: 50.0000 mg | ORAL_TABLET | Freq: Two times a day (BID) | ORAL | 0 refills | Status: DC

## 2021-12-15 NOTE — Telephone Encounter (Signed)
Routed to PCP 

## 2021-12-15 NOTE — Telephone Encounter (Signed)
She can't take that one with the Blood thinners I called her to advise  No Meloxicam with the Xarelto   To you FYI

## 2021-12-15 NOTE — Telephone Encounter (Signed)
patient called to relay she uses only [employer], mail in, and will request medication soon for refil - said will need 90 day supply for her mail in medication requests.

## 2021-12-15 NOTE — Telephone Encounter (Signed)
*  STAT* If patient is at the pharmacy, call can be transferred to refill team.   1. Which medications need to be refilled? (please list name of each medication and dose if known)  rosuvastatin (CRESTOR) 10 MG tablet metoprolol tartrate (LOPRESSOR) 50 MG tablet   2. Which pharmacy/location (including street and city if local pharmacy) is medication to be sent to? [place] ([facility] ) - [place], [place]  3. Do they need a 30 day or 90 day supply?  90 day supply

## 2021-12-15 NOTE — Telephone Encounter (Signed)
Pt request prescription for calcium-vitamin D (OSCAL WITH D) 500-200 MG-UNIT tablet. Unable to reorder medication, needs a new prescription from PCP.

## 2021-12-15 NOTE — Telephone Encounter (Signed)
Medication Refill - Medication:  metFORMIN (GLUCOPHAGE) 1000 MG tablet  ferrous sulfate (FEROSUL) 325 (65 FE) MG tablet  sitaGLIPtin (JANUVIA) 100 MG  amLODipine (NORVASC) 5 MG tablet  rivaroxaban (XARELTO) 20 MG TABS tablet calcium-vitamin D (OSCAL WITH D) 500-200 MG-UNIT tablet oxybutynin (DITROPAN) 5 MG tablet  citalopram (CELEXA) 40 MG tablet buPROPion (WELLBUTRIN SR) 150 MG 12 hr tablet   Has the patient contacted their pharmacy? Yes.   Contact PCP  *Pt changing pharmacy and needs refills sent over*  Preferred Pharmacy (with phone number or street name):  [facility] ([facility] ) - [place], [place] Phone:  [phone]  Fax:  [phone]      Has the patient been seen for an appointment in the last year OR does the patient have an upcoming appointment? Yes.    Agent: Please be advised that RX refills may take up to 3 business days. We ask that you follow-up with your pharmacy.

## 2021-12-15 NOTE — Telephone Encounter (Signed)
Requested medication (s) are due for refill today:   Pt is changing pharmacies.  Needs new prescriptions sent for listed medications  Requested medication (s) are on the active medication list:   Yes for all 8 plus for a calcium-vitamin D pill  Future visit scheduled:   Yes   Last ordered: Send to [place],  [place], [place]  Phone [phone];   Fax [phone]  Calcium-Vitamin D (Oscal with D 500-200 mg-unit) Metformin 1000 mg 12/07/2021 #180, 0 refills Ferrous Sulfate (Ferosul) 325 mg  12/11/2021 #90, 0 refills Sitagliptin (Januvia) 100 mg  09/08/2021 #90, 1 refill Amlodipine (Norvasc) 5 mg  12/11/2021 #90, 0 refills Rivaroxaban (Xarelto) 20 mg  12/11/2021 #90, 0 refills Oxybutynin (Ditropan) 5 mg  04/20/2021  #270, 1 refill Citalopram (Celexa) 40 mg  10/06/2021 #90, 1 refill Bupropion 150 mg (Wellbutrin SR)  08/03/2021  #180, 2 refills   Requested Prescriptions  Pending Prescriptions Disp Refills   metFORMIN (GLUCOPHAGE) 1000 MG tablet 180 tablet 0    Sig: Take 1 tablet (1,000 mg total) by mouth 2 (two) times daily with a meal.     Endocrinology:  Diabetes - Biguanides Failed - 12/15/2021 11:52 AM      Failed - Cr in normal range and within 360 days    Creatinine, Ser  Date Value Ref Range Status  05/24/2021 1.04 (H) 0.44 - 1.00 mg/dL Final   Creatinine, Urine  Date Value Ref Range Status  12/17/2019 128.15 mg/dL Final    Comment:    Performed at [facility], [address] [place], [place] [address]         Passed - HBA1C is between 0 and 7.9 and within 180 days    Hemoglobin A1C  Date Value Ref Range Status  10/06/2021 7.5 (A) 4.0 - 5.6 % Final   Hgb A1c MFr Bld  Date Value Ref Range Status  05/22/2021 8.9 (H) 4.8 - 5.6 % Final    Comment:    (NOTE) Pre diabetes:          5.7%-6.4%  Diabetes:              >6.4%  Glycemic control for   <7.0% adults with diabetes          Passed - eGFR in normal range and within 360 days    GFR calc Af Amer  Date  Value Ref Range Status  03/17/2020 >60 >60 mL/min Final   GFR, Estimated  Date Value Ref Range Status  05/24/2021 >60 >60 mL/min Final    Comment:    (NOTE) Calculated using the CKD-EPI Creatinine Equation (2021)    eGFR  Date Value Ref Range Status  03/10/2021 61 >59 mL/min/1.73 Final         Passed - B12 Level in normal range and within 720 days    Vitamin B-12  Date Value Ref Range Status  06/02/2020 460 232 - 1,245 pg/mL Final         Passed - Valid encounter within last 6 months    Recent Outpatient Visits           2 months ago Type 2 diabetes mellitus with 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, with long-term current use of insulin ([place])   [place] [facility] [name] P, NP   4 months ago Type 2 diabetes mellitus with 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, without long-term current use of insulin ([place])   [facility] [name], NP   9 months  ago Type 2 diabetes mellitus with 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, with long-term current use of insulin ([place])   [place] [name] P, NP   1 year ago Type 2 diabetes mellitus with 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, with long-term current use of insulin ([place])   [place] [name], NP   1 year ago Type 2 diabetes mellitus with 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, with long-term current use of insulin ([place])   [place] [facility] [name], NP       Future Appointments             In 3 weeks Oletta Lamas, Milford Cage, NP [place]   In 1 month [name], MD [facility]             Passed - CBC within normal limits and completed in the last 12 months    WBC  Date Value Ref Range Status  05/24/2021 10.2 4.0 - 10.5 K/uL Final   RBC  Date Value Ref Range Status  05/24/2021 3.62 (L) 3.87 - 5.11 MIL/uL Final   Hemoglobin  Date Value Ref Range Status   05/24/2021 10.7 (L) 12.0 - 15.0 g/dL Final  08/05/2020 12.8 11.1 - 15.9 g/dL Final   HCT  Date Value Ref Range Status  05/24/2021 32.1 (L) 36.0 - 46.0 % Final   Hematocrit  Date Value Ref Range Status  08/05/2020 38.9 34.0 - 46.6 % Final   MCHC  Date Value Ref Range Status  05/24/2021 33.3 30.0 - 36.0 g/dL Final   [facility]  Date Value Ref Range Status  05/24/2021 29.6 26.0 - 34.0 pg Final   MCV  Date Value Ref Range Status  05/24/2021 88.7 80.0 - 100.0 fL Final  08/05/2020 88 79 - 97 fL Final   No results found for: PLTCOUNTKUC, LABPLAT, POCPLA RDW  Date Value Ref Range Status  05/24/2021 14.2 11.5 - 15.5 % Final  08/05/2020 13.8 11.7 - 15.4 % Final          ferrous sulfate (FEROSUL) 325 (65 FE) MG tablet 90 tablet 0    Sig: TAKE (1) TABLET DAILY WITH BREAKFAST.     Endocrinology:  Minerals - Iron Supplementation Failed - 12/15/2021 11:52 AM      Failed - HGB in normal range and within 360 days    Hemoglobin  Date Value Ref Range Status  05/24/2021 10.7 (L) 12.0 - 15.0 g/dL Final  08/05/2020 12.8 11.1 - 15.9 g/dL Final         Failed - HCT in normal range and within 360 days    HCT  Date Value Ref Range Status  05/24/2021 32.1 (L) 36.0 - 46.0 % Final   Hematocrit  Date Value Ref Range Status  08/05/2020 38.9 34.0 - 46.6 % Final         Failed - RBC in normal range and within 360 days    RBC  Date Value Ref Range Status  05/24/2021 3.62 (L) 3.87 - 5.11 MIL/uL Final         Failed - Fe (serum) in normal range and within 360 days    No results found for: IRON, IRONPCTSAT       Failed - Ferritin in normal range and within 360 days    No results found for: FERRITIN       Passed - Valid encounter within last 12 months    Recent Outpatient Visits  2 months ago Type 2 diabetes mellitus with 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, with long-term current use of insulin ([place])   [place] [facility] [name] P, NP   4 months ago Type 2  diabetes mellitus with 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, without long-term current use of insulin ([place])   [place], Michelle P, NP   9 months ago Type 2 diabetes mellitus with 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, with long-term current use of insulin ([place])   [place] [name] P, NP   1 year ago Type 2 diabetes mellitus with 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, with long-term current use of insulin ([place])   [place] [name] P, NP   1 year ago Type 2 diabetes mellitus with 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, with long-term current use of insulin ([place])   [place] [facility] [name], NP       Future Appointments             In 3 weeks Oletta Lamas, Milford Cage, NP [place]   In 1 month [name], MD [facility], [facility]              sitaGLIPtin (JANUVIA) 100 MG tablet 90 tablet 1    Sig: Take 1 tablet (100 mg total) by mouth daily.     Endocrinology:  Diabetes - DPP-4 Inhibitors Failed - 12/15/2021 11:52 AM      Failed - Cr in normal range and within 360 days    Creatinine, Ser  Date Value Ref Range Status  05/24/2021 1.04 (H) 0.44 - 1.00 mg/dL Final   Creatinine, Urine  Date Value Ref Range Status  12/17/2019 128.15 mg/dL Final    Comment:    Performed at [facility], [address], [place], [place] [address]         Passed - HBA1C is between 0 and 7.9 and within 180 days    Hemoglobin A1C  Date Value Ref Range Status  10/06/2021 7.5 (A) 4.0 - 5.6 % Final   Hgb A1c MFr Bld  Date Value Ref Range Status  05/22/2021 8.9 (H) 4.8 - 5.6 % Final    Comment:    (NOTE) Pre diabetes:          5.7%-6.4%  Diabetes:              >6.4%  Glycemic control for   <7.0% adults with diabetes          Passed - Valid encounter within last 6 months    Recent Outpatient Visits           2 months ago Type 2 diabetes mellitus with  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, with long-term current use of insulin ([place])   [place] [facility] [name] P, NP   4 months ago Type 2 diabetes mellitus with 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, without long-term current use of insulin ([place])   [place] [facility] [name] P, NP   9 months ago Type 2 diabetes mellitus with 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, with long-term current use of insulin ([place])   [place] [facility] [name], NP   1 year ago Type 2 diabetes mellitus with 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, with long-term current use of insulin ([place])   [place] [facility] [name], NP   1 year ago Type 2 diabetes mellitus with 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, with long-term current use of insulin ([place])  [facility] [name], NP       Future Appointments             In 3 weeks [name], NP [place]   In 1 month [name], MD [facility], [facility]              amLODipine (NORVASC) 5 MG tablet 90 tablet 0    Sig: Take 1 tablet (5 mg total) by mouth daily.     Cardiovascular: Calcium Channel Blockers 2 Passed - 12/15/2021 11:52 AM      Passed - Last BP in normal range    BP Readings from Last 1 Encounters:  10/26/21 105/74         Passed - Last Heart Rate in normal range    Pulse Readings from Last 1 Encounters:  10/26/21 70         Passed - Valid encounter within last 6 months    Recent Outpatient Visits           2 months ago Type 2 diabetes mellitus with 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, with long-term current use of insulin ([place])   [place] [facility] [name] P, NP   4 months ago Type 2 diabetes mellitus with 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, without long-term current use of insulin ([place])   [place], Michelle P, NP   9 months ago Type 2 diabetes  mellitus with 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, with long-term current use of insulin ([place])   [place] [name], NP   1 year ago Type 2 diabetes mellitus with 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, with long-term current use of insulin ([place])   [place] [name], NP   1 year ago Type 2 diabetes mellitus with 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, with long-term current use of insulin ([place])   [place] [facility] [name], NP       Future Appointments             In 3 weeks Oletta Lamas, Milford Cage, NP [place]   In 1 month [name], MD [facility]              rivaroxaban (XARELTO) 20 MG TABS tablet 90 tablet 0    Sig: TAKE ONE TABLET BY MOUTH WITH SUPPER     Hematology: Anticoagulants - rivaroxaban Failed - 12/15/2021 11:52 AM      Failed - ALT in normal range and within 360 days    ALT  Date Value Ref Range Status  03/10/2021 34 (H) 0 - 32 IU/L Final         Failed - Cr in normal range and within 360 days    Creatinine, Ser  Date Value Ref Range Status  05/24/2021 1.04 (H) 0.44 - 1.00 mg/dL Final   Creatinine, Urine  Date Value Ref Range Status  12/17/2019 128.15 mg/dL Final    Comment:    Performed at [facility], [address] [place], [place] [address]         Failed - HCT in normal range and within 360 days    HCT  Date Value Ref Range Status  05/24/2021 32.1 (L) 36.0 - 46.0 % Final   Hematocrit  Date Value Ref Range Status  08/05/2020 38.9 34.0 - 46.6 % Final         Failed -  HGB in normal range and within 360 days    Hemoglobin  Date Value Ref Range Status  05/24/2021 10.7 (L) 12.0 - 15.0 g/dL Final  08/05/2020 12.8 11.1 - 15.9 g/dL Final         Passed - AST in normal range and within 360 days    AST  Date Value Ref Range Status  03/10/2021 20 0 - 40 IU/L Final         Passed - PLT in normal range and within  360 days    Platelets  Date Value Ref Range Status  05/24/2021 205 150 - 400 K/uL Final  08/05/2020 201 150 - 450 x10E3/uL Final         Passed - eGFR is 15 or above and within 360 days    GFR calc Af Amer  Date Value Ref Range Status  03/17/2020 >60 >60 mL/min Final   GFR, Estimated  Date Value Ref Range Status  05/24/2021 >60 >60 mL/min Final    Comment:    (NOTE) Calculated using the CKD-EPI Creatinine Equation (2021)    eGFR  Date Value Ref Range Status  03/10/2021 61 >59 mL/min/1.73 Final         Passed - Patient is not pregnant      Passed - Valid encounter within last 12 months    Recent Outpatient Visits           2 months ago Type 2 diabetes mellitus with 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, with long-term current use of insulin ([place])   [place] [name] P, NP   4 months ago Type 2 diabetes mellitus with 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, without long-term current use of insulin ([place])   [place] [name] P, NP   9 months ago Type 2 diabetes mellitus with 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, with long-term current use of insulin ([place])   [place] [name], NP   1 year ago Type 2 diabetes mellitus with 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, with long-term current use of insulin ([place])   [place] [name], NP   1 year ago Type 2 diabetes mellitus with 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, with long-term current use of insulin ([place])   [place], Michelle P, NP       Future Appointments             In 3 weeks Oletta Lamas, Milford Cage, NP [place]   In 1 month [name], MD [facility], [facility]              oxybutynin (DITROPAN) 5 MG tablet 270 tablet 1    Sig: TAKE 1 TABLET 2 TO 3 TIMES A DAY FOR BLADDER SPASMS     Urology:  Bladder Agents Passed - 12/15/2021 11:52 AM       Passed - Valid encounter within last 12 months    Recent Outpatient Visits           2 months ago Type 2 diabetes mellitus with 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, with long-term current use of insulin ([place])   [place] [facility] [name] P, NP   4 months ago Type 2 diabetes mellitus with 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, without long-term current use of insulin ([place])   [place] [facility] [name] P, NP   9 months ago Type 2 diabetes mellitus with 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, with long-term current use of  insulin ([place])   [place] [facility] [name], NP   1 year ago Type 2 diabetes mellitus with 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, with long-term current use of insulin ([place])   [place] [name], NP   1 year ago Type 2 diabetes mellitus with 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, with long-term current use of insulin ([place])   [place] [facility] [name], NP       Future Appointments             In 3 weeks [name], NP [place]   In 1 month [name], MD [facility], [facility]              citalopram (CELEXA) 40 MG tablet 90 tablet 1    Sig: Take 1 tablet (40 mg total) by mouth daily.     Psychiatry:  Antidepressants - SSRI Passed - 12/15/2021 11:52 AM      Passed - Valid encounter within last 6 months    Recent Outpatient Visits           2 months ago Type 2 diabetes mellitus with 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, with long-term current use of insulin ([place])   [place] [name] P, NP   4 months ago Type 2 diabetes mellitus with 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, without long-term current use of insulin ([place])   [place], Michelle P, NP   9 months ago Type 2 diabetes mellitus with 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, with long-term current use of insulin  ([place])   [place] [name] P, NP   1 year ago Type 2 diabetes mellitus with 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, with long-term current use of insulin ([place])   [place] [name] P, NP   1 year ago Type 2 diabetes mellitus with 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, with long-term current use of insulin ([place])   [place] [facility] [name], NP       Future Appointments             In 3 weeks [name], NP [place]   In 1 month [name], MD [facility]              buPROPion (WELLBUTRIN SR) 150 MG 12 hr tablet 180 tablet 2    Sig: Take one tablet daily for 3 days. Then, take one tablet twice daily for 3 months.     Psychiatry: Antidepressants - bupropion Failed - 12/15/2021 11:52 AM      Failed - Cr in normal range and within 360 days    Creatinine, Ser  Date Value Ref Range Status  05/24/2021 1.04 (H) 0.44 - 1.00 mg/dL Final   Creatinine, Urine  Date Value Ref Range Status  12/17/2019 128.15 mg/dL Final    Comment:    Performed at [facility], [address] [place], [place] [address]         Failed - ALT in normal range and within 360 days    ALT  Date Value Ref Range Status  03/10/2021 34 (H) 0 - 32 IU/L Final         Passed - AST in normal range and within 360 days    AST  Date Value Ref Range Status  03/10/2021 20 0 - 40 IU/L Final  Passed - Last BP in normal range    BP Readings from Last 1 Encounters:  10/26/21 105/74         Passed - Valid encounter within last 6 months    Recent Outpatient Visits           2 months ago Type 2 diabetes mellitus with 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, with long-term current use of insulin ([place])   [place] [name] P, NP   4 months ago Type 2 diabetes mellitus with 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, without long-term current use of insulin  ([place])   [place], Michelle P, NP   9 months ago Type 2 diabetes mellitus with 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, with long-term current use of insulin ([place])   [place] [name] P, NP   1 year ago Type 2 diabetes mellitus with 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, with long-term current use of insulin ([place])   [place] [name] P, NP   1 year ago Type 2 diabetes mellitus with 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, with long-term current use of insulin ([place])   [place], [place], NP       Future Appointments             In 3 weeks Oletta Lamas Milford Cage, NP [place]   In 1 month [name], MD [facility]

## 2021-12-15 NOTE — Telephone Encounter (Signed)
Patient called back to request refill on her Meloxicam, 7.5 mg. States needs to have 90 day supply, and to be sent to: [facility], fax [phone] ([facility] appears on pharmacy list - states it is her Primary Pharmacy)

## 2021-12-17 ENCOUNTER — Other Ambulatory Visit: Payer: Self-pay | Admitting: Pharmacist

## 2021-12-17 DIAGNOSIS — Z760 Encounter for issue of repeat prescription: Secondary | ICD-10-CM

## 2021-12-17 DIAGNOSIS — N3942 Incontinence without sensory awareness: Secondary | ICD-10-CM

## 2021-12-17 MED ORDER — OXYBUTYNIN CHLORIDE 5 MG PO TABS: ORAL_TABLET | ORAL | 0 refills | Status: DC

## 2021-12-20 ENCOUNTER — Other Ambulatory Visit: Payer: Self-pay

## 2021-12-20 MED ORDER — ROSUVASTATIN CALCIUM 10 MG PO TABS: 10.0000 mg | ORAL_TABLET | Freq: Every day | ORAL | 1 refills | Status: DC

## 2021-12-22 MED ORDER — RIVAROXABAN 20 MG PO TABS: ORAL_TABLET | ORAL | 1 refills | Status: DC

## 2021-12-22 MED ORDER — FERROUS SULFATE 325 (65 FE) MG PO TABS: ORAL_TABLET | ORAL | 1 refills | Status: DC

## 2021-12-22 MED ORDER — CITALOPRAM HYDROBROMIDE 40 MG PO TABS
40.0000 mg | ORAL_TABLET | Freq: Every day | ORAL | 1 refills | Status: DC
Start: 2021-12-22 — End: 2022-03-16

## 2021-12-22 MED ORDER — BUPROPION HCL ER (SR) 150 MG PO TB12: ORAL_TABLET | ORAL | 2 refills | Status: DC

## 2021-12-22 MED ORDER — METFORMIN HCL 1000 MG PO TABS: 1000.0000 mg | ORAL_TABLET | Freq: Two times a day (BID) | ORAL | 1 refills | Status: DC

## 2021-12-22 MED ORDER — AMLODIPINE BESYLATE 5 MG PO TABS: 5.0000 mg | ORAL_TABLET | Freq: Every day | ORAL | 1 refills | Status: DC

## 2021-12-22 MED ORDER — SITAGLIPTIN PHOSPHATE 100 MG PO TABS: 100.0000 mg | ORAL_TABLET | Freq: Every day | ORAL | 1 refills | Status: DC

## 2021-12-23 DIAGNOSIS — M6281 Muscle weakness (generalized): Secondary | ICD-10-CM | POA: Diagnosis not present

## 2021-12-23 DIAGNOSIS — R3981 Functional urinary incontinence: Secondary | ICD-10-CM | POA: Diagnosis not present

## 2021-12-24 ENCOUNTER — Ambulatory Visit: Payer: Medicaid Other | Admitting: Orthopedic Surgery

## 2021-12-27 ENCOUNTER — Ambulatory Visit: Payer: Medicaid Other | Admitting: Orthopedic Surgery

## 2021-12-31 ENCOUNTER — Telehealth (INDEPENDENT_AMBULATORY_CARE_PROVIDER_SITE_OTHER): Payer: Self-pay | Admitting: Primary Care

## 2022-01-03 ENCOUNTER — Other Ambulatory Visit: Payer: Self-pay | Admitting: *Deleted

## 2022-01-03 MED ORDER — METOPROLOL TARTRATE 50 MG PO TABS: 50.0000 mg | ORAL_TABLET | Freq: Two times a day (BID) | ORAL | 0 refills | Status: DC

## 2022-01-04 ENCOUNTER — Other Ambulatory Visit (INDEPENDENT_AMBULATORY_CARE_PROVIDER_SITE_OTHER): Payer: Self-pay | Admitting: Primary Care

## 2022-01-04 ENCOUNTER — Telehealth: Payer: Self-pay | Admitting: Radiology

## 2022-01-04 ENCOUNTER — Telehealth (INDEPENDENT_AMBULATORY_CARE_PROVIDER_SITE_OTHER): Payer: Self-pay | Admitting: Primary Care

## 2022-01-04 MED ORDER — GNP ULTICARE PEN NEEDLES 32G X 4 MM MISC: 6 refills | Status: DC

## 2022-01-07 ENCOUNTER — Ambulatory Visit (INDEPENDENT_AMBULATORY_CARE_PROVIDER_SITE_OTHER): Payer: Medicaid Other | Admitting: Primary Care

## 2022-01-07 ENCOUNTER — Other Ambulatory Visit (INDEPENDENT_AMBULATORY_CARE_PROVIDER_SITE_OTHER): Payer: Self-pay | Admitting: Primary Care

## 2022-01-07 NOTE — Telephone Encounter (Signed)
Paper work recently received again as office fax had been down. Will have PCP provide information requested and fax back.

## 2022-01-10 ENCOUNTER — Other Ambulatory Visit: Payer: Self-pay | Admitting: Orthopedic Surgery

## 2022-01-10 MED ORDER — MELOXICAM 7.5 MG PO TABS: 7.5000 mg | ORAL_TABLET | Freq: Every day | ORAL | 5 refills | Status: DC

## 2022-01-10 NOTE — Progress Notes (Signed)
Meds ordered this encounter  Medications   meloxicam (MOBIC) 7.5 MG tablet    Sig: Take 1 tablet (7.5 mg total) by mouth daily.    Dispense:  30 tablet    Refill:  5    

## 2022-01-13 ENCOUNTER — Other Ambulatory Visit: Payer: Self-pay | Admitting: Family Medicine

## 2022-01-13 DIAGNOSIS — Z1231 Encounter for screening mammogram for malignant neoplasm of breast: Secondary | ICD-10-CM

## 2022-01-13 DIAGNOSIS — Z720 Tobacco use: Secondary | ICD-10-CM

## 2022-01-13 NOTE — Telephone Encounter (Signed)
Forms faxed

## 2022-01-15 ENCOUNTER — Other Ambulatory Visit (INDEPENDENT_AMBULATORY_CARE_PROVIDER_SITE_OTHER): Payer: Self-pay | Admitting: Primary Care

## 2022-01-15 DIAGNOSIS — E1100 Type 2 diabetes mellitus with hyperosmolarity without nonketotic hyperglycemic-hyperosmolar coma (NKHHC): Secondary | ICD-10-CM

## 2022-01-15 DIAGNOSIS — Z760 Encounter for issue of repeat prescription: Secondary | ICD-10-CM

## 2022-01-16 NOTE — Progress Notes (Deleted)
Cardiology Office Note:    Date:  01/16/2022   ID:  Rhonda Hickman, DOB 1955-10-19, MRN [id number]  PCP:  [name], NP  Cardiologist:  [name], MD  Electrophysiologist:  None   Referring MD: [name], NP   No chief complaint on file.   History of Present Illness:    Rhonda Hickman is a 66 y.o. female with a hx of TIA, atrial fibrillation, BPD, COPD, T2DM, asthma who presents for follow-up.  She was referred by Rhonda Mire, NP for evaluation of atrial fibrillation, initially seen on 12/16/2019.  She reports that she was in the hospital for 5 days in [place] in May 2021.  States that she was admitted with AKI and UTI and found to have atrial fibrillation.  Started on Xarelto.  States that she has been taking this without any bleeding issues.  Reports she has had intermittent palpitations over the last month since her discharge from the hospital.  She reports a history of stabbing chest pain in the center of her chest but has not had any recently, does however chest pressure with exertion.  States that going for walks causes her to have exertional chest pressure.  She is smoking about half a pack per day, down from peak of 2 packs/day.  States that both her mother and father had MIs but unsure of age.  TTE on 08/03/2019 showed LVEF 60-65%, normal RV function, normal biatrial size, no significant valvular disease.  Lexiscan Myoview on 01/03/2020 showed low risk stress test with breast attenuation but no ischemia, LVEF 48%.  Carotid duplex on 01/14/2020 showed bilateral 1 to 39% stenosis.  Zio patch x14 days on 01/29/2020 showed 1% atrial flutter burden, average heart rate 86 bpm.  Zio patch x3 days on 05/25/2020 showed no significant arrhythmias.  Since last clinic visit,  ***Colonoscopy reports she continues have dizziness.  Occurs with certain head movements.  States that her chest pain has improved.  Does report she continues to have dyspnea,  improves with inhaler use.  Also reports has had some lower extremity edema.  Continues to have intermittent palpitations.  Continues to smoke, down 0.75 packs/day.  She is taking Xarelto, does report she has noted some blood on toilet paper at times.  She has never had a colonoscopy.   Past Medical History:  Diagnosis Date   Anemia    Anxiety    Asthma    Atrial fibrillation ([place])    Bipolar 1 disorder (HCC)    Bulging lumbar disc    Chronic pain of left knee    COPD (chronic obstructive pulmonary disease) (HCC)    CVA (cerebral vascular accident) ([place])    Diabetes mellitus    Neuropathy    TIA (transient ischemic attack)    Vertigo     Past Surgical History:  Procedure Laterality Date   ABDOMINAL HYSTERECTOMY     CARPAL TUNNEL RELEASE Right    CHOLECYSTECTOMY     KNEE ARTHROSCOPY WITH LATERAL MENISECTOMY Left 06/08/2021   Procedure: KNEE ARTHROSCOPY WITH LATERAL MENISCECTOMY AND MEDIAL MENISCECTOMY;  Surgeon: Carole Civil, MD;  Location: [facility];  Service: Orthopedics;  Laterality: Left;   KNEE ARTHROSCOPY WITH MEDIAL MENISECTOMY Right 04/14/2016   Procedure: KNEE ARTHROSCOPY WITH MEDIAL AND LATERAL MENISECTOMY, MICROFRACTURE REPAIR;  Surgeon: Carole Civil, MD;  Location: [facility];  Service: Orthopedics;  Laterality: Right;  lateral menisectomy - needs crutch training    Current Medications: No outpatient medications have been marked  as taking for the 01/20/22 encounter (Appointment) with [name], MD.     Allergies:   Aspirin   Social History   Socioeconomic History   Marital status: Single    Spouse name: Not on file   Number of children: 0   Years of education: 12   Highest education level: High school graduate  Occupational History   Occupation: Unemployed  Tobacco Use   Smoking status: Some Days    Packs/day: 0.50    Types: Cigarettes   Smokeless tobacco: Never   Tobacco comments:    tobacco info given  Vaping Use   Vaping Use: Some  days  Substance and Sexual Activity   Alcohol use: No   Drug use: No   Sexual activity: Never  Other Topics Concern   Not on file  Social History Narrative   Lives at home alone.   Right-handed.   No caffeine use.   Social Determinants of Health   Financial Resource Strain: Not on file  Food Insecurity: Not on file  Transportation Needs: Not on file  Physical Activity: Not on file  Stress: Not on file  Social Connections: Not on file     Family History: The patient's family history includes Diabetes in her mother; Heart attack in her father.  ROS:   Please see the history of present illness.     All other systems reviewed and are negative.  EKGs/Labs/Other Studies Reviewed:    The following studies were reviewed today:   EKG:  EKG is not ordered today.  The ekg ordered at prior clinic visit demonstrates normal sinus rhythm, rate 62, nonspecific T wave flattening  Recent Labs: 03/10/2021: ALT 34 05/22/2021: B Natriuretic Peptide 184.0; Magnesium 1.9; TSH 1.280 05/24/2021: BUN 14; Creatinine, Ser 1.04; Hemoglobin 10.7; Platelets 205; Potassium 3.3; Sodium 136  Recent Lipid Panel    Component Value Date/Time   CHOL 100 03/10/2021 1147   TRIG 135 03/10/2021 1147   HDL 28 (L) 03/10/2021 1147   CHOLHDL 3.6 03/10/2021 1147   CHOLHDL 11.2 08/04/2019 0548   VLDL 32 08/04/2019 0548   LDLCALC 48 03/10/2021 1147    Physical Exam:    VS:  There were no vitals taken for this visit.    Wt Readings from Last 3 Encounters:  08/03/21 277 lb (125.6 kg)  06/08/21 (P) 279 lb 15.8 oz (127 kg)  06/02/21 280 lb (127 kg)     GEN: in no acute distress HEENT: Normal NECK: No JVD LYMPHATICS: No lymphadenopathy CARDIAC: RRR, no murmurs, rubs, gallops RESPIRATORY:  Clear to auscultation without rales, wheezing or rhonchi  ABDOMEN: Soft, non-tender, non-distended MUSCULOSKELETAL:  No edema SKIN: Warm and dry NEUROLOGIC:  Alert and oriented x 3 PSYCHIATRIC:  Normal affect    ASSESSMENT:    No diagnosis found.  PLAN:    Dizziness: unclear cause.  Head CT negative.  Othostatics in clinic were unremarkable.  Possibly vertigo by description, started on meclizine and referred to neurology for further evaluation.  Brain MRI on 06/16/2020 was unremarkable.  Zio patch x3 days on 05/25/2020 showed no significant arrhythmias.  Chest pain: description suggests typical angina and has significant risk factors (type 2 diabetes, hypertension, tobacco use).  TTE on 08/03/2019 showed LVEF 60-65%, normal RV function, normal biatrial size, no significant valvular disease.  Lexiscan Myoview on 01/03/2020 showed low risk stress test with breast attenuation but no ischemia, LVEF 48%.  No further cardiac work-up recommended.  Atrial fibrillation: CHA2DS2-VASc score 5 (hypertension, diabetes, CVA,  female).  Zio patch x14 days on 01/29/2020 showed 1% atrial flutter burden, average heart rate 86 bpm. -Continue Xarelto.   -Continue metoprolol 25 mg twice daily  Hypertension: On amlodipine 5 mg daily, lisinopril 20 mg daily, metoprolol 25 mg twice daily.    T2DM: On insulin.  A1c 10.5 on 06/08/2020  Hyperlipidemia: Given history of diabetes and possible CVA, should be on statin, started rosuvastatin 10 mg daily at prior clinic visit.  LDL 67 on 06/08/2020.  Tobacco use: Patient counseled on the risks of tobacco use and cessation strongly encouraged.    TIA: Follows with neurology.  Carotid duplex on 01/14/2020 showed bilateral 1 to 39% stenosis.  Zio patch x14 days on 01/29/2020 showed 1% atrial flutter burden, average heart rate 86 bpm.  Snoring: sleep study with mild OSA, no indication for CPAP at this time  Rectal bleeding: Has noted bright red blood per rectum intermittently.  On Xarelto for atrial fibrillation.  Referred to gastroenterology  RTC in 6 months  Medication Adjustments/Labs and Tests Ordered: Current medicines are reviewed at length with the patient today.  Concerns  regarding medicines are outlined above.  No orders of the defined types were placed in this encounter.  No orders of the defined types were placed in this encounter.   There are no Patient Instructions on file for this visit.   Signed, [name], MD  01/16/2022 8:35 PM    [place]

## 2022-01-17 NOTE — Telephone Encounter (Signed)
Rhonda Hickman calling from [facility] [employer] is calling to follow up on requested correction to submitted CMN paperwork. States that the fax was not received. Rhonda [phone] PHONE- [phone]

## 2022-01-18 NOTE — Telephone Encounter (Signed)
Forms have been faxed back to [facility]

## 2022-01-18 NOTE — Telephone Encounter (Signed)
completed

## 2022-01-18 NOTE — Telephone Encounter (Signed)
Here is the contact information so that you can get clarity on what exactly it needs to say for medical necessity.

## 2022-01-20 ENCOUNTER — Ambulatory Visit: Payer: Medicaid Other | Admitting: Cardiology

## 2022-01-21 ENCOUNTER — Other Ambulatory Visit: Payer: Self-pay | Admitting: Cardiology

## 2022-01-24 DIAGNOSIS — R3981 Functional urinary incontinence: Secondary | ICD-10-CM | POA: Diagnosis not present

## 2022-01-24 DIAGNOSIS — M6281 Muscle weakness (generalized): Secondary | ICD-10-CM | POA: Diagnosis not present

## 2022-01-27 ENCOUNTER — Encounter: Payer: Self-pay | Admitting: Orthopedic Surgery

## 2022-01-27 ENCOUNTER — Ambulatory Visit (INDEPENDENT_AMBULATORY_CARE_PROVIDER_SITE_OTHER): Payer: Medicare Other | Admitting: Orthopedic Surgery

## 2022-01-27 VITALS — Ht 72.0 in | Wt 277.0 lb

## 2022-01-27 DIAGNOSIS — M5136 Other intervertebral disc degeneration, lumbar region: Secondary | ICD-10-CM

## 2022-01-27 DIAGNOSIS — Z9889 Other specified postprocedural states: Secondary | ICD-10-CM | POA: Diagnosis not present

## 2022-01-27 NOTE — Patient Instructions (Addendum)
Physical therapy has been ordered for you at [facility]. They should call you to schedule, [phone] is the phone number to call, if you want to call to schedule.    MAKE SURE YOU ARE USING YOUR WALKER

## 2022-01-27 NOTE — Progress Notes (Signed)
Chief Complaint  Patient presents with   Difficulty Walking    Can't walk/ can't drive, states wants to go back to therapy does not want back injections or to go back to the back doctor in [place] had arthroscopy of her knee she did well with that however she has had progressively worsening ambulation  She does not want to go back to neurosurgery as she does not want to have injections she is amenable to going to therapy  See recent MRI Lumbar spine   L3-4: There is a shallow disc bulge and small protrusion in the left foramen. Mild left foraminal narrowing is present. The central canal and right foramen are open. Mild facet arthropathy noted.   L4-5: Advanced bilateral facet degenerative change. A small facet joint effusion is present on the left. The disc is uncovered with a shallow bulge. There is mild central canal stenosis and mild to moderate bilateral foraminal narrowing.   L5-S1: Shallow broad-based right paracentral protrusion without central canal or foraminal stenosis.   IMPRESSION: Advanced facet degenerative change at L4-5 results in 0.3 cm anterolisthesis. There is mild central canal stenosis and mild to moderate bilateral foraminal narrowing at L4-5.   Mild left foraminal narrowing at L3-4 due to a small foraminal protrusion.     Electronically Signed   By: Inge Rise M.D.   On: 10/29/2021 08:18   there is nothing I can do to help Alandra at this point with her degenerative disc disease  I recommend she see the neurosurgeon again is okay for her to go to therapy  As far as her knee arthroscopy is concerned agreement was to do the knee arthroscopy but no total knee that would have to be done at a tertiary care facility based on her pathology and pain medicine requirements  I am releasing her from orthopedic care in [place]

## 2022-01-28 NOTE — Telephone Encounter (Signed)
Racquel from [facility] stated CMN form has not been received please fax to an alternate fax  # [phone]. Caller phone #  [phone]. Please note when completed.

## 2022-01-28 NOTE — Telephone Encounter (Signed)
Faxed forms to alternate fax number provided.

## 2022-02-03 ENCOUNTER — Ambulatory Visit (INDEPENDENT_AMBULATORY_CARE_PROVIDER_SITE_OTHER): Payer: Medicaid Other | Admitting: Primary Care

## 2022-02-11 ENCOUNTER — Other Ambulatory Visit (INDEPENDENT_AMBULATORY_CARE_PROVIDER_SITE_OTHER): Payer: Self-pay | Admitting: Primary Care

## 2022-02-11 MED ORDER — BASAGLAR KWIKPEN 100 UNIT/ML ~~LOC~~ SOPN: 24.0000 [IU] | PEN_INJECTOR | Freq: Two times a day (BID) | SUBCUTANEOUS | 3 refills | Status: DC

## 2022-02-12 ENCOUNTER — Telehealth (INDEPENDENT_AMBULATORY_CARE_PROVIDER_SITE_OTHER): Payer: Self-pay | Admitting: Primary Care

## 2022-02-12 NOTE — Telephone Encounter (Signed)
Copied from [place] [id number]. Topic: General - Other >> Feb 11, 2022  3:36 PM Marcellus Scott wrote: Reason for YIA:[id number] from [facility] stated pt is requesting home health for personal care services pt has trouble getting stuff done on her own. Up to 35 hours a week if the patient absolutely needs it; otherwise, 28 hours each week is what is covered by [facility].  Pt preference for home health is [place], [place], [place] [address]  Please advise.

## 2022-02-14 ENCOUNTER — Other Ambulatory Visit (INDEPENDENT_AMBULATORY_CARE_PROVIDER_SITE_OTHER): Payer: Self-pay | Admitting: Primary Care

## 2022-02-14 NOTE — Telephone Encounter (Signed)
Home Health Verbal Orders - Caller/Agency: Rhonda Hickman is patient Callback Number: patients phone number [phone] Requesting" personal care services Pt preference for home health is [facility] -Address: [address], [place], [place] [address]  [phone] fx phone number  [phone] Frequency: 28hours a week /

## 2022-02-15 NOTE — Telephone Encounter (Signed)
Rhonda Hickman called for follow up on request, please advise. She says as soon as the PCP is able because the patient is struggling without assistance.

## 2022-02-15 NOTE — Telephone Encounter (Signed)
Attempted to call to provide verbal orders. Number provided was to a Urologist. Searched the web for correct number and received a voicemail that says number does not have voicemail setup. Will contact patient to see if she can provide another number for homecare company.

## 2022-02-16 NOTE — Telephone Encounter (Signed)
Spoke with Cammie. She will complete forms. Another Rep from [facility] will reach out once that's completed and fax forms over.

## 2022-02-17 ENCOUNTER — Other Ambulatory Visit: Payer: Self-pay | Admitting: Family Medicine

## 2022-02-17 ENCOUNTER — Other Ambulatory Visit: Payer: Self-pay | Admitting: Internal Medicine

## 2022-02-17 DIAGNOSIS — Z760 Encounter for issue of repeat prescription: Secondary | ICD-10-CM

## 2022-02-17 DIAGNOSIS — N3942 Incontinence without sensory awareness: Secondary | ICD-10-CM

## 2022-02-17 DIAGNOSIS — N644 Mastodynia: Secondary | ICD-10-CM

## 2022-02-17 NOTE — Telephone Encounter (Signed)
Cammie called regarding office notes that they need. She says that additional information was faxed over 02/16/2022. She wants to know if this was received.   Best contact: Does not have a direct line to call back [phone] Case number: [id number]

## 2022-02-18 ENCOUNTER — Other Ambulatory Visit: Payer: Self-pay | Admitting: Internal Medicine

## 2022-02-18 DIAGNOSIS — N644 Mastodynia: Secondary | ICD-10-CM

## 2022-02-18 NOTE — Telephone Encounter (Signed)
Request routed PCP

## 2022-02-18 NOTE — Telephone Encounter (Signed)
Rhonda Hickman called to share this fax listed below  Fax: [phone]  And these 4 requests  They need a current prescription for Home Health  Plus any relevant notes pertaining to her abilities and why she would need extra help  Letter of medical necessity  Any relevant CPT or HCPC codes   Requesting rush on order

## 2022-02-18 NOTE — Congregational Nurse Program (Signed)
  Dept: [place] [facility] Nurse Program Note  Date of Encounter: 02/18/2022  Past Medical History: Past Medical History:  Diagnosis Date   Anemia    Anxiety    Asthma    Atrial fibrillation ([place])    Bipolar 1 disorder ([place])    Bulging lumbar disc    Chronic pain of left knee    COPD (chronic obstructive pulmonary disease) (HCC)    CVA (cerebral vascular accident) ([place])    Diabetes mellitus    Neuropathy    TIA (transient ischemic attack)    Vertigo     Encounter Details:  CNP Questionnaire - 02/17/22 1500       Questionnaire   Do you give verbal consent to treat you today? Yes    Location Patient [place]    Visit Setting Home    Patient Status Unknown    Insurance Medicare    Insurance Referral N/A    Medication N/A    Medical Provider Yes    Screening Referrals N/A    Medical Referral N/A    Medical Appointment Made N/A    Food Have Food Insecurities   [facility]   Transportation N/A    Housing/Utilities N/A    Interpersonal Safety N/A    Intervention N/A;Support    ED Visit Averted N/A    Life-Saving Intervention Made N/A

## 2022-02-18 NOTE — Congregational Nurse Program (Signed)
F/u with patient as a result of food security and delivered  senior meals to home.  

## 2022-02-21 ENCOUNTER — Other Ambulatory Visit (INDEPENDENT_AMBULATORY_CARE_PROVIDER_SITE_OTHER): Payer: Self-pay | Admitting: Primary Care

## 2022-02-21 ENCOUNTER — Telehealth (INDEPENDENT_AMBULATORY_CARE_PROVIDER_SITE_OTHER): Payer: Self-pay | Admitting: Primary Care

## 2022-02-21 DIAGNOSIS — F3340 Major depressive disorder, recurrent, in remission, unspecified: Secondary | ICD-10-CM

## 2022-02-21 DIAGNOSIS — Z7409 Other reduced mobility: Secondary | ICD-10-CM

## 2022-02-21 DIAGNOSIS — R269 Unspecified abnormalities of gait and mobility: Secondary | ICD-10-CM

## 2022-02-21 DIAGNOSIS — E1142 Type 2 diabetes mellitus with diabetic polyneuropathy: Secondary | ICD-10-CM

## 2022-02-21 NOTE — Telephone Encounter (Signed)
Order placed for [facility] services and increase level of supervision

## 2022-02-21 NOTE — Telephone Encounter (Signed)
Please provide update

## 2022-02-21 NOTE — Telephone Encounter (Signed)
Copied from [place] [id number]. Topic: General - Other >> Feb 21, 2022 10:26 AM Cyndi Bender wrote: Reason for CRM: Cammie with [facility] called for an update on the requests for additional information. Cb# [phone]

## 2022-02-22 ENCOUNTER — Other Ambulatory Visit: Payer: Self-pay | Admitting: Cardiology

## 2022-02-22 NOTE — Telephone Encounter (Signed)
Request denied. Cammie called stating that they need a letter of medical necessity because without it the request has been denied. She says they need a letter of medical necessity along with the office notes and any CPT codes associated, also any current prescriptions. She says they need as much information as possible.

## 2022-02-22 NOTE — Telephone Encounter (Signed)
Routed to PCP 

## 2022-02-23 ENCOUNTER — Other Ambulatory Visit: Payer: Self-pay | Admitting: Internal Medicine

## 2022-02-23 DIAGNOSIS — N644 Mastodynia: Secondary | ICD-10-CM

## 2022-02-23 DIAGNOSIS — M6281 Muscle weakness (generalized): Secondary | ICD-10-CM | POA: Diagnosis not present

## 2022-02-23 DIAGNOSIS — R3981 Functional urinary incontinence: Secondary | ICD-10-CM | POA: Diagnosis not present

## 2022-02-23 NOTE — Telephone Encounter (Signed)
Cammy from [facility] is calling to follow up on [place] appeal stated they need a letter of medical necessity along with the office notes and any CPT codes associated, also any current prescriptions. She says they need as much information as possible. If we have a certain physician order, they would appreciate as well mentioned Cammy.   Please send to Fax - [phone] To appeals correspondence.   Please advise.

## 2022-02-23 NOTE — Telephone Encounter (Signed)
Routed to PCP 

## 2022-02-23 NOTE — Telephone Encounter (Signed)
Called to [facility] with patient case  [id number] spoke with Quita Skye is unable to find information . Asked if the information could be found by members ID [id number]. Adam hung up the phone.

## 2022-02-24 ENCOUNTER — Ambulatory Visit (HOSPITAL_COMMUNITY): Payer: Medicare Other | Attending: Orthopedic Surgery | Admitting: Physical Therapy

## 2022-02-24 DIAGNOSIS — R2689 Other abnormalities of gait and mobility: Secondary | ICD-10-CM | POA: Diagnosis not present

## 2022-02-24 DIAGNOSIS — Z9889 Other specified postprocedural states: Secondary | ICD-10-CM | POA: Diagnosis not present

## 2022-02-24 DIAGNOSIS — M25562 Pain in left knee: Secondary | ICD-10-CM | POA: Insufficient documentation

## 2022-02-24 DIAGNOSIS — M5136 Other intervertebral disc degeneration, lumbar region: Secondary | ICD-10-CM | POA: Diagnosis not present

## 2022-02-24 DIAGNOSIS — M25561 Pain in right knee: Secondary | ICD-10-CM | POA: Insufficient documentation

## 2022-02-24 NOTE — Therapy (Signed)
OUTPATIENT PHYSICAL THERAPY LOWER EXTREMITY EVALUATION   Patient Name: Rhonda Hickman MRN: [id number] DOB:August 03, 1955, 66 y.o., female Today's Date: 02/24/2022    Past Medical History:  Diagnosis Date   Anemia    Anxiety    Asthma    Atrial fibrillation ([place])    Bipolar 1 disorder ([place])    Bulging lumbar disc    Chronic pain of left knee    COPD (chronic obstructive pulmonary disease) (HCC)    CVA (cerebral vascular accident) ([place])    Diabetes mellitus    Neuropathy    TIA (transient ischemic attack)    Vertigo    Past Surgical History:  Procedure Laterality Date   ABDOMINAL HYSTERECTOMY     CARPAL TUNNEL RELEASE Right    CHOLECYSTECTOMY     KNEE ARTHROSCOPY WITH LATERAL MENISECTOMY Left 06/08/2021   Procedure: KNEE ARTHROSCOPY WITH LATERAL MENISCECTOMY AND MEDIAL MENISCECTOMY;  Surgeon: Carole Civil, MD;  Location: [facility];  Service: Orthopedics;  Laterality: Left;   KNEE ARTHROSCOPY WITH MEDIAL MENISECTOMY Right 04/14/2016   Procedure: KNEE ARTHROSCOPY WITH MEDIAL AND LATERAL MENISECTOMY, MICROFRACTURE REPAIR;  Surgeon: Carole Civil, MD;  Location: [facility];  Service: Orthopedics;  Laterality: Right;  lateral menisectomy - needs crutch training   Patient Active Problem List   Diagnosis Date Noted   S/P left knee arthroscopy11/29/22 07/13/2021   Tear of meniscus of knee joint    Acute lower UTI 05/23/2021   A-fib ([place]) 05/22/2021   Leukocytosis 05/22/2021   Fall at home, initial encounter 05/22/2021    Class: Acute   HLD (hyperlipidemia) 05/22/2021   Hypertension 03/18/2020   Bipolar 1 disorder ([place])    Chronic atrial fibrillation ([place]) 12/03/2019   TIA (transient ischemic attack) 12/03/2019   Diabetic peripheral neuropathy ([place]) 12/03/2019   Chronic migraine 12/03/2019   Vertigo 08/03/2019   Bronchiectasis with acute exacerbation (HCC)    Obesity, Class III, BMI 40-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) ([place])    Gastroesophageal reflux disease    Asthma exacerbation  06/06/2018   Gait abnormality 08/29/2017   Paresthesia 08/29/2017   Derangement of posterior horn of medial meniscus of right knee    Meniscus, lateral, derangement, right    Arthritis of knee, right    COPD with acute exacerbation ([place]) 03/09/2016   Type 2 diabetes mellitus ([place]) 02/16/2007   Cocaine abuse ([place]) 02/16/2007   Extrinsic asthma 02/16/2007   HOMELESSNESS, HX OF 02/16/2007    PCP: [name] NP  REFERRING PROVIDER: Carole Civil, MD   REFERRING DIAG: [phone] (ICD-10-CM) - S/P left knee arthroscopy M51.36 (ICD-10-CM) - DDD (degenerative disc disease), lumbar   THERAPY DIAG:  Other abnormalities of gait and mobility  Left knee pain, unspecified chronicity  Right knee pain, unspecified chronicity  Rationale for Evaluation and Treatment Rehabilitation  ONSET DATE: October 2022  SUBJECTIVE:   SUBJECTIVE STATEMENT: Patient presents to therapy with complaint of bilateral knee pain. She reports having surgery on both knees in the past 1-2 years. She states that she was able to walk fine until a few months after she had knee surgery. She says she had surgery on LT knee in October and by February she couldn't walk. She was referred to back specialist in [place] and told she had a pinched nerve. She states her back doesn't bother her, but her knees hurt and she has trouble walking.   PERTINENT HISTORY: Bilateral knee surgery, chronic LBP   PAIN:  Are you having pain? Yes: NPRS scale: 9/10 Pain location: both knees  Pain description: throbbing, aggravating, "running water"  Aggravating factors: unsure, WB  Relieving factors: laying, elevation feet   PRECAUTIONS: Fall  WEIGHT BEARING RESTRICTIONS No  FALLS:  Has patient fallen in last 6 months? Yes. Number of falls 4-5  LIVING ENVIRONMENT: Lives with: lives with their family and lives alone Lives in: House/apartment Stairs:  ramp Has following equipment at home: Single point cane, Environmental consultant - 2  wheeled, Wheelchair (manual), and Ramped entry  OCCUPATION: Disability   PLOF: Needs assistance with ADLs  PATIENT GOALS "I want to walk and be out of pain"    OBJECTIVE:   DIAGNOSTIC FINDINGS: IMPRESSION: Advanced facet degenerative change at L4-5 results in 0.3 cm anterolisthesis. There is mild central canal stenosis and mild to moderate bilateral foraminal narrowing at L4-5.   Mild left foraminal narrowing at L3-4 due to a small foraminal protrusion.    PATIENT SURVEYS:  FOTO (3% knee/ 28% back)  COGNITION:  Overall cognitive status: Within functional limits for tasks assessed     LOWER EXTREMITY MMT:  MMT Right eval Left eval  Hip flexion 5 5  Hip extension    Hip abduction    Hip adduction    Hip internal rotation    Hip external rotation    Knee flexion    Knee extension 4 4-  Ankle dorsiflexion 5 5  Ankle plantarflexion    Ankle inversion    Ankle eversion     (Blank rows = not tested)  FUNCTIONAL TESTS:  STS labored, heavy use of UE assist, hops from chair to bed   GAIT: Distance walked: 8 feet Assistive device utilized: Walker - 2 wheeled Level of assistance: Modified independence and SBA Comments: trunk flexed, drags RT foot, heavy reliance on Ues     TODAY'S TREATMENT: Eval Laq Quad set Seated heel slide   PATIENT EDUCATION:  Education details: on eval findings, POC and HEP Person educated: Patient Education method: Explanation Education comprehension: verbalized understanding   HOME EXERCISE PROGRAM: Access Code: [email or web address] URL: [email or web address] Date: 02/24/2022 Prepared by: Josue Hector  Exercises - Supine Quad Set  - 3 x daily - 7 x weekly - 1 sets - 10 reps - 5 second hold - Seated Long Arc Quad  - 3 x daily - 7 x weekly - 1 sets - 10 reps - 5 second hold - Seated Knee Flexion Extension AROM   - 3 x daily - 7 x weekly - 1 sets - 10 reps - 5 second hold  ASSESSMENT:  CLINICAL IMPRESSION: Patient is  a 66 y.o. female who presents to physical therapy with complaint of bilateral knee pain and gait disturbance. Patient demonstrates muscle weakness, reduced ROM, decreased balance and gait deviations which are likely contributing to symptoms of pain and are negatively impacting patient ability to perform ADLs and functional mobility tasks. Patient will benefit from skilled physical therapy services to address these deficits to reduce pain and improve level of function with ADLs and functional mobility tasks.    OBJECTIVE IMPAIRMENTS Abnormal gait, decreased activity tolerance, decreased balance, decreased mobility, difficulty walking, decreased ROM, decreased strength, impaired flexibility, improper body mechanics, and pain.   ACTIVITY LIMITATIONS standing, transfers, and locomotion level  PARTICIPATION LIMITATIONS: meal prep, cleaning, shopping, and community activity  PERSONAL FACTORS Age, Behavior pattern, Past/current experiences, Time since onset of injury/illness/exacerbation, and 3+ comorbidities: Stroke, DM, HTN  are also affecting patient's functional outcome.   REHAB POTENTIAL: Fair Stroke, DM, HTN  CLINICAL DECISION MAKING: Stable/uncomplicated  EVALUATION COMPLEXITY: Low  GOALS: SHORT TERM GOALS: Target date: 03/17/2022  Patient will be independent with initial HEP and self-management strategies to improve functional outcomes Baseline:  Goal status: INITIAL    LONG TERM GOALS: Target date: 04/07/2022  Patient will be independent with advanced HEP and self-management strategies to improve functional outcomes Baseline:  Goal status: INITIAL  2.  Patient will improve FOTO score to predicted value to indicate improvement in functional outcomes Baseline: 3% knee 28% lumbar Goal status: INITIAL  3.  Patient will report reduction of knee pain to <5/10 for improved quality of life and ability to perform ADLs  Baseline: 9/10 Goal status: INITIAL  4. Patient will have equal to  or > 4+/5 MMT throughout BLE to improve ability to perform functional mobility, stair ambulation and ADLs.  Baseline: See MMT Goal status: INITIAL  5. Patient will be able to ambulate at least 150 feet during 2MWT with LRAD to demonstrate improved ability to perform functional mobility and associated tasks. Baseline: 8 feet Goal status: INITIAL   PLAN: PT FREQUENCY: 3x/week  PT DURATION: 6 weeks  PLANNED INTERVENTIONS: Therapeutic exercises, Therapeutic activity, Neuromuscular re-education, Balance training, Gait training, Patient/Family education, Joint manipulation, Joint mobilization, Stair training, Aquatic Therapy, Dry Needling, Electrical stimulation, Spinal manipulation, Spinal mobilization, Cryotherapy, Moist heat, scar mobilization, Taping, Traction, Ultrasound, Biofeedback, Ionotophoresis '4mg'$ /ml Dexamethasone, and Manual therapy.   PLAN FOR NEXT SESSION: Progress functional strength as tolerated (seated>standing> balance> gait)   4:40 PM, 02/24/22 Josue Hector PT DPT  Physical Therapist with [facility]  [phone]

## 2022-02-25 ENCOUNTER — Encounter (INDEPENDENT_AMBULATORY_CARE_PROVIDER_SITE_OTHER): Payer: Self-pay | Admitting: Primary Care

## 2022-02-25 ENCOUNTER — Other Ambulatory Visit: Payer: Self-pay | Admitting: Pharmacist

## 2022-02-25 ENCOUNTER — Ambulatory Visit (INDEPENDENT_AMBULATORY_CARE_PROVIDER_SITE_OTHER): Payer: Medicare Other | Admitting: Primary Care

## 2022-02-25 VITALS — BP 129/76 | HR 68 | Temp 97.6°F | Ht 72.0 in | Wt 269.6 lb

## 2022-02-25 DIAGNOSIS — E1100 Type 2 diabetes mellitus with hyperosmolarity without nonketotic hyperglycemic-hyperosmolar coma (NKHHC): Secondary | ICD-10-CM

## 2022-02-25 DIAGNOSIS — Z760 Encounter for issue of repeat prescription: Secondary | ICD-10-CM | POA: Diagnosis not present

## 2022-02-25 DIAGNOSIS — N3942 Incontinence without sensory awareness: Secondary | ICD-10-CM | POA: Diagnosis not present

## 2022-02-25 DIAGNOSIS — Z794 Long term (current) use of insulin: Secondary | ICD-10-CM

## 2022-02-25 LAB — POCT GLYCOSYLATED HEMOGLOBIN (HGB A1C): Hemoglobin A1C: 8.3 % — AB (ref 4.0–5.6)

## 2022-02-25 MED ORDER — OZEMPIC (0.25 OR 0.5 MG/DOSE) 2 MG/3ML ~~LOC~~ SOPN: PEN_INJECTOR | SUBCUTANEOUS | 1 refills | Status: DC

## 2022-02-25 MED ORDER — OXYBUTYNIN CHLORIDE 5 MG PO TABS: ORAL_TABLET | ORAL | 0 refills | Status: DC

## 2022-02-25 MED ORDER — METFORMIN HCL 1000 MG PO TABS: 1000.0000 mg | ORAL_TABLET | Freq: Two times a day (BID) | ORAL | 1 refills | Status: DC

## 2022-02-25 NOTE — Patient Instructions (Addendum)
Discontinue Januvia. Continue metformin '1000mg'$  twice daily, Ozempic once a week  Semaglutide Injection What is this medication? SEMAGLUTIDE (SEM a GLOO tide) treats type 2 diabetes. It works by increasing insulin levels in your body, which decreases your blood sugar (glucose). It also reduces the amount of sugar released into the blood and slows down your digestion. It can also be used to lower the risk of heart attack and stroke in people with type 2 diabetes. Changes to diet and exercise are often combined with this medication. This medicine may be used for other purposes; ask your health care provider or pharmacist if you have questions. COMMON BRAND NAME(S): OZEMPIC What should I tell my care team before I take this medication? They need to know if you have any of these conditions: Endocrine tumors (MEN 2) or if someone in your family had these tumors Eye disease, vision problems History of pancreatitis Kidney disease Stomach problems Thyroid cancer or if someone in your family had thyroid cancer An unusual or allergic reaction to semaglutide, other medications, foods, dyes, or preservatives Pregnant or trying to get pregnant Breast-feeding How should I use this medication? This medication is for injection under the skin of your upper leg (thigh), stomach area, or upper arm. It is given once every week (every 7 days). You will be taught how to prepare and give this medication. Use exactly as directed. Take your medication at regular intervals. Do not take it more often than directed. If you use this medication with insulin, you should inject this medication and the insulin separately. Do not mix them together. Do not give the injections right next to each other. Change (rotate) injection sites with each injection. It is important that you put your used needles and syringes in a special sharps container. Do not put them in a trash can. If you do not have a sharps container, call your  pharmacist or care team to get one. A special MedGuide will be given to you by the pharmacist with each prescription and refill. Be sure to read this information carefully each time. This medication comes with INSTRUCTIONS FOR USE. Ask your pharmacist for directions on how to use this medication. Read the information carefully. Talk to your pharmacist or care team if you have questions. Talk to your care team about the use of this medication in children. Special care may be needed. Overdosage: If you think you have taken too much of this medicine contact a poison control center or emergency room at once. NOTE: This medicine is only for you. Do not share this medicine with others. What if I miss a dose? If you miss a dose, take it as soon as you can within 5 days after the missed dose. Then take your next dose at your regular weekly time. If it has been longer than 5 days after the missed dose, do not take the missed dose. Take the next dose at your regular time. Do not take double or extra doses. If you have questions about a missed dose, contact your care team for advice. What may interact with this medication? Other medications for diabetes Many medications may cause changes in blood sugar, these include: Alcohol containing beverages Antiviral medications for HIV or AIDS Aspirin and aspirin-like medications Certain medications for blood pressure, heart disease, irregular heart beat Chromium Diuretics Female hormones, such as estrogens or progestins, birth control pills Fenofibrate Gemfibrozil Isoniazid Lanreotide Female hormones or anabolic steroids MAOIs like Carbex, Eldepryl, Marplan, Nardil, and Parnate Medications for  weight loss Medications for allergies, asthma, cold, or cough Medications for depression, anxiety, or psychotic disturbances Niacin Nicotine NSAIDs, medications for pain and inflammation, like ibuprofen or  naproxen Octreotide Pasireotide Pentamidine Phenytoin Probenecid Quinolone antibiotics such as ciprofloxacin, levofloxacin, ofloxacin Some herbal dietary supplements Steroid medications such as prednisone or cortisone Sulfamethoxazole; trimethoprim Thyroid hormones Some medications can hide the warning symptoms of low blood sugar (hypoglycemia). You may need to monitor your blood sugar more closely if you are taking one of these medications. These include: Beta-blockers, often used for high blood pressure or heart problems (examples include atenolol, metoprolol, propranolol) Clonidine Guanethidine Reserpine This list may not describe all possible interactions. Give your health care provider a list of all the medicines, herbs, non-prescription drugs, or dietary supplements you use. Also tell them if you smoke, drink alcohol, or use illegal drugs. Some items may interact with your medicine. What should I watch for while using this medication? Visit your care team for regular checks on your progress. Drink plenty of fluids while taking this medication. Check with your care team if you get an attack of severe diarrhea, nausea, and vomiting. The loss of too much body fluid can make it dangerous for you to take this medication. A test called the HbA1C (A1C) will be monitored. This is a simple blood test. It measures your blood sugar control over the last 2 to 3 months. You will receive this test every 3 to 6 months. Learn how to check your blood sugar. Learn the symptoms of low and high blood sugar and how to manage them. Always carry a quick-source of sugar with you in case you have symptoms of low blood sugar. Examples include hard sugar candy or glucose tablets. Make sure others know that you can choke if you eat or drink when you develop serious symptoms of low blood sugar, such as seizures or unconsciousness. They must get medical help at once. Tell your care team if you have high blood sugar.  You might need to change the dose of your medication. If you are sick or exercising more than usual, you might need to change the dose of your medication. Do not skip meals. Ask your care team if you should avoid alcohol. Many nonprescription cough and cold products contain sugar or alcohol. These can affect blood sugar. Pens should never be shared. Even if the needle is changed, sharing may result in passing of viruses like hepatitis or HIV. Wear a medical ID bracelet or chain, and carry a card that describes your disease and details of your medication and dosage times. Do not become pregnant while taking this medication. Women should inform their care team if they wish to become pregnant or think they might be pregnant. There is a potential for serious side effects to an unborn child. Talk to your care team for more information. What side effects may I notice from receiving this medication? Side effects that you should report to your care team as soon as possible: Allergic reactions--skin rash, itching, hives, swelling of the face, lips, tongue, or throat Change in vision Dehydration--increased thirst, dry mouth, feeling faint or lightheaded, headache, dark yellow or brown urine Gallbladder problems--severe stomach pain, nausea, vomiting, fever Heart palpitations--rapid, pounding, or irregular heartbeat Kidney injury--decrease in the amount of urine, swelling of the ankles, hands, or feet Pancreatitis--severe stomach pain that spreads to your back or gets worse after eating or when touched, fever, nausea, vomiting Thyroid cancer--new mass or lump in the neck, pain or trouble  swallowing, trouble breathing, hoarseness Side effects that usually do not require medical attention (report to your care team if they continue or are bothersome): Diarrhea Loss of appetite Nausea Stomach pain Vomiting This list may not describe all possible side effects. Call your doctor for medical advice about side  effects. You may report side effects to FDA at 1-800-FDA-1088. Where should I keep my medication? Keep out of the reach of children. Store unopened pens in a refrigerator between 2 and 8 degrees C (36 and 46 degrees F). Do not freeze. Protect from light and heat. After you first use the pen, it can be stored for 56 days at room temperature between 15 and 30 degrees C (59 and 86 degrees F) or in a refrigerator. Throw away your used pen after 56 days or after the expiration date, whichever comes first. Do not store your pen with the needle attached. If the needle is left on, medication may leak from the pen. NOTE: This sheet is a summary. It may not cover all possible information. If you have questions about this medicine, talk to your doctor, pharmacist, or health care provider.  2023 Elsevier/Gold Standard (2020-09-10 00:00:00)

## 2022-02-27 NOTE — Progress Notes (Signed)
 Subjective:  Patient ID: Rhonda Hickman, female    DOB: 11/07/1955  Age: 66 y.o. MRN: [id number]  CC: Diabetes   HPI [name] presents for Follow-up of diabetes. Patient does not check blood sugar at home.  Patient has recently declined and difficulty performing ADLs without assistance.  Past history of total knee replacement no problems she was able to move about no pain continue on with her daily life.  Second knee replacement she states she is in pain, the knee is swollen and she is unable to bear weight or do transfers.  She has also fallen several times and needs assistance in her home for safety and medication compliance.  She also has a history of a CVA with right-sided weakness.  Compliant with meds - Yes Checking CBGs? Yes  Fasting avg -   Postprandial average -  Exercising regularly? - No Watching carbohydrate intake? - Yes Neuropathy ? - Yes Hypoglycemic events - No  - Recovers with :   Pertinent ROS:  Polyuria - Yes Polydipsia - No Vision problems - No  Medications as noted below. Taking them regularly without complication/adverse reaction being reported today.   History Ivonne has a past medical history of Anemia, Anxiety, Asthma, Atrial fibrillation (HCC), Bipolar 1 disorder (HCC), Bulging lumbar disc, Chronic pain of left knee, COPD (chronic obstructive pulmonary disease) (HCC), CVA (cerebral vascular accident) (HCC), Diabetes mellitus, Neuropathy, TIA (transient ischemic attack), and Vertigo.   She has a past surgical history that includes Abdominal hysterectomy; Carpal tunnel release (Right); Knee arthroscopy with medial menisectomy (Right, 04/14/2016); Cholecystectomy; and Knee arthroscopy with lateral menisectomy (Left, 06/08/2021).   Her family history includes Diabetes in her mother; Heart attack in her father.She reports that she has been smoking cigarettes. She has been smoking an average of .5 packs per day. She has never used smokeless tobacco. She  reports that she does not drink alcohol and does not use drugs.  Current Outpatient Medications on File Prior to Visit  Medication Sig Dispense Refill   albuterol (VENTOLIN HFA) 108 (90 Base) MCG/ACT inhaler INHALE 2 PUFFS EVERY 6 HOURS AS NEEDED FOR WHEEZING OR SHORTNESS OF BREATH 18 g 1   amLODipine (NORVASC) 5 MG tablet Take 1 tablet (5 mg total) by mouth daily. 90 tablet 1   blood glucose meter kit and supplies Dispense based on patient and insurance preference. Use up to four times daily as directed. (FOR ICD-10 E10.9, E11.9). 1 each 0   budesonide-formoterol (SYMBICORT) 160-4.5 MCG/ACT inhaler Inhale 2 puffs into the lungs 2 (two) times daily. 1 each 3   buPROPion (WELLBUTRIN SR) 150 MG 12 hr tablet Take  one tablet twice daily 180 tablet 2   calcium-vitamin D (OSCAL WITH D) 500-200 MG-UNIT tablet TAKE (1) TABLET DAILY WITH BREAKFAST. 90 tablet 1   citalopram (CELEXA) 40 MG tablet Take 1 tablet (40 mg total) by mouth daily. 90 tablet 1   divalproex (DEPAKOTE ER) 500 MG 24 hr tablet Take 1-2 tablets by mouth 2 (two) times daily. One tablet in the morning and 2 tablets at bedtime     FEROSUL 325 (65 Fe) MG tablet TAKE ONE TABLET BY MOUTH DAILY AT 9AM WITH BREAKFAST 90 tablet 11   fluticasone (FLONASE) 50 MCG/ACT nasal spray Place 2 sprays into both nostrils daily. 16 g 6   glucose blood (ACCU-CHEK GUIDE) test strip CHECK BLOOD SUGAR UP TO FOUR TIMES DAILY AS DIRECTED 100 strip 11   GNP ULTICARE PEN NEEDLES 32G X 4 MM MISC   USE TO INJECT INSULIN UP TO 5 TIMES A DAY 200 each 6   Insulin Glargine (BASAGLAR KWIKPEN) 100 UNIT/ML Inject 24 Units into the skin 2 (two) times daily. 15 mL 3   Insulin Syringe-Needle U-100 (ADVOCATE INSULIN SYRINGE) 29G X 1/2" 0.3 ML MISC Use to inject insulin 5x daily. 200 each 6   metoprolol tartrate (LOPRESSOR) 50 MG tablet TAKE 1 TABLET BY MOUTH TWICE  DAILY 60 tablet 0   oxyCODONE-acetaminophen (PERCOCET) 7.5-325 MG tablet Take 1 tablet by mouth every 4 (four) hours  as needed for severe pain. 30 tablet 0   PEG-KCl-NaCl-NaSulf-Na Asc-C (PLENVU) 140 g SOLR Take 140 g by mouth as directed. Manufacturer's coupon Universal coupon code:BIN: 019158; GROUP: [id number]; PCN: [id number]; ID: [id number]; PAY NO MORE $50 1 each 0   rivaroxaban (XARELTO) 20 MG TABS tablet TAKE ONE TABLET BY MOUTH WITH SUPPER 90 tablet 1   rosuvastatin (CRESTOR) 10 MG tablet Take 1 tablet (10 mg total) by mouth daily. KEEP UPCOMING APPOINTMENT FOR FUTURE REFILLS. 90 tablet 0   No current facility-administered medications on file prior to visit.    ROS Comprehensive ROS Pertinent positive and negative noted in HPI    Objective:  BP 129/76   Pulse 68   Temp 97.6 F (36.4 C) (Oral)   Ht 6' (1.829 m)   Wt 269 lb 9.6 oz (122.3 kg)   SpO2 94%   BMI 36.56 kg/m   BP Readings from Last 3 Encounters:  02/25/22 129/76  10/26/21 105/74  10/06/21 132/61    Wt Readings from Last 3 Encounters:  02/25/22 269 lb 9.6 oz (122.3 kg)  01/27/22 277 lb (125.6 kg)  08/03/21 277 lb (125.6 kg)    Physical Exam Vitals reviewed.  Constitutional:      Appearance: She is obese.  HENT:     Head: Normocephalic.     Right Ear: Tympanic membrane and external ear normal.     Left Ear: Tympanic membrane and external ear normal.     Nose: Nose normal.  Cardiovascular:     Rate and Rhythm: Normal rate and regular rhythm.  Pulmonary:     Effort: Pulmonary effort is normal.     Breath sounds: Normal breath sounds.  Genitourinary:    Comments: Urinary incontinence Musculoskeletal:     Cervical back: Normal range of motion and neck supple.     Comments: Decreased range of motion in lower extremities difficulty ambulating presenting in a wheelchair needs assistance with transfers  Skin:    General: Skin is warm and dry.  Neurological:     Mental Status: She is alert. Mental status is at baseline.     Coordination: Coordination abnormal.     Gait: Gait abnormal.     Comments: Dysphagia   Psychiatric:        Mood and Affect: Mood normal.        Behavior: Behavior normal.   Lab Results  Component Value Date   HGBA1C 8.3 (A) 02/25/2022   HGBA1C 7.5 (A) 10/06/2021   HGBA1C 8.9 (H) 05/22/2021    Lab Results  Component Value Date   WBC 10.2 05/24/2021   HGB 10.7 (L) 05/24/2021   HCT 32.1 (L) 05/24/2021   PLT 205 05/24/2021   GLUCOSE 161 (H) 05/24/2021   CHOL 100 03/10/2021   TRIG 135 03/10/2021   HDL 28 (L) 03/10/2021   LDLCALC 48 03/10/2021   ALT 34 (H) 03/10/2021   AST 20 03/10/2021   NA 136 05/24/2021     K 3.3 (L) 05/24/2021   CL 105 05/24/2021   CREATININE 1.04 (H) 05/24/2021   BUN 14 05/24/2021   CO2 24 05/24/2021   TSH 1.280 05/22/2021   HGBA1C 8.3 (A) 02/25/2022     Assessment & Plan:   Aaliyan was seen today for diabetes.  Diagnoses and all orders for this visit:  Type 2 diabetes mellitus with 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, with long-term current use of insulin (HCC) -     HgB A1c 8.2  Continue to monitor.foods that are high in carbohydrates are the following rice, potatoes, breads, sugars, and pastas.  Reduction in the intake (eating) will assist in lowering your blood sugars.  --     metFORMIN (GLUCOPHAGE) 1000 MG tablet; Take 1 tablet (1,000 mg total) by mouth 2 (two) times daily with a meal.  Added Ozempic 0.25 weekly -     CBC with Differential/Platelet; Future -     Comprehensive metabolic panel; Future -     Lipid panel; Future  Urinary incontinence without sensory awareness -     oxybutynin (DITROPAN) 5 MG tablet; TAKE 1 TABLET 2 TO 3 TIMES A DAY FOR BLADDER SPASMS  Medication refill -     oxybutynin (DITROPAN) 5 MG tablet; TAKE 1 TABLET 2 TO 3 TIMES A DAY FOR BLADDER SPASMS -     metFORMIN (GLUCOPHAGE) 1000 MG tablet; Take 1 tablet (1,000 mg total) by mouth 2 (two) times daily with a meal.   I am having Sherrin Daisy maintain her Plenvu, fluticasone, Advocate Insulin Syringe, calcium-vitamin D, albuterol, divalproex,  oxyCODONE-acetaminophen, blood glucose meter kit and supplies, budesonide-formoterol, Accu-Chek Guide, amLODipine, rivaroxaban, citalopram, buPROPion, GNP UltiCare Pen Needles, metoprolol tartrate, Basaglar KwikPen, FeroSul, rosuvastatin, oxybutynin, and metFORMIN.  Meds ordered this encounter  Medications   oxybutynin (DITROPAN) 5 MG tablet    Sig: TAKE 1 TABLET 2 TO 3 TIMES A DAY FOR BLADDER SPASMS    Dispense:  270 tablet    Refill:  0    Order Specific Question:   Supervising Provider    Answer:   Tresa Garter [[id number]]   metFORMIN (GLUCOPHAGE) 1000 MG tablet    Sig: Take 1 tablet (1,000 mg total) by mouth 2 (two) times daily with a meal.    Dispense:  180 tablet    Refill:  1    Order Specific Question:   Supervising Provider    AnswerTresa Garter [id number]     Follow-up:   Return in about 3 months (around 05/28/2022) for DM/HTN.  The above assessment and management plan was discussed with the patient. The patient verbalized understanding of and has agreed to the management plan. Patient is aware to call the clinic if symptoms fail to improve or worsen. Patient is aware when to return to the clinic for a follow-up visit. Patient educated on when it is appropriate to go to the emergency department.   [name], NP-C

## 2022-02-28 ENCOUNTER — Telehealth: Payer: Self-pay | Admitting: Cardiology

## 2022-02-28 NOTE — Telephone Encounter (Signed)
Patient only has one pill left

## 2022-02-28 NOTE — Telephone Encounter (Signed)
*  STAT* If patient is at the pharmacy, call can be transferred to refill team.   1. Which medications need to be refilled? (please list name of each medication and dose if known) metoprolol tartrate (LOPRESSOR) 50 MG tablet  2. Which pharmacy/location (including street and city if local pharmacy) is medication to be sent to? [place], [place]  3. Do they need a 30 day or 90 day supply? 90   Pt states that she is out of this medication.  Has appt scheduled with Dr. Gardiner Rhyme on 12/05

## 2022-03-01 ENCOUNTER — Other Ambulatory Visit (INDEPENDENT_AMBULATORY_CARE_PROVIDER_SITE_OTHER): Payer: Self-pay | Admitting: Primary Care

## 2022-03-01 DIAGNOSIS — Z760 Encounter for issue of repeat prescription: Secondary | ICD-10-CM

## 2022-03-01 DIAGNOSIS — I152 Hypertension secondary to endocrine disorders: Secondary | ICD-10-CM

## 2022-03-01 DIAGNOSIS — E782 Mixed hyperlipidemia: Secondary | ICD-10-CM

## 2022-03-01 DIAGNOSIS — Z794 Long term (current) use of insulin: Secondary | ICD-10-CM

## 2022-03-01 MED ORDER — METOPROLOL TARTRATE 50 MG PO TABS: 50.0000 mg | ORAL_TABLET | Freq: Two times a day (BID) | ORAL | 0 refills | Status: DC

## 2022-03-01 NOTE — Telephone Encounter (Signed)
Patient was calling in to to see if prescription was sent. Please see below

## 2022-03-01 NOTE — Telephone Encounter (Signed)
Routed to PCP 

## 2022-03-02 NOTE — Telephone Encounter (Signed)
Patient had a office visit and due to needing more care she and her family are in the process to move her to [place]

## 2022-03-04 NOTE — Telephone Encounter (Signed)
Cammie from [facility] called back in checking status of request sent for medical necessity letter,  cpt codes , relevant rx for prescriptions related home health. Fx # is [phone]. Please call back

## 2022-03-04 NOTE — Telephone Encounter (Signed)
Routed to PCP 

## 2022-03-07 ENCOUNTER — Inpatient Hospital Stay: Admission: RE | Admit: 2022-03-07 | Payer: Medicaid Other | Source: Ambulatory Visit

## 2022-03-08 NOTE — Telephone Encounter (Signed)
Patient is moving to [place] with family to help take care of her

## 2022-03-09 ENCOUNTER — Telehealth: Payer: Self-pay | Admitting: Cardiology

## 2022-03-09 NOTE — Telephone Encounter (Signed)
Cammie with [employer] is requesting advisement on home health and any other recommendations Dr. Gardiner Rhyme has for the patient. She would also like to have clinical notes faxed to their office. Transferred Cammie to Medical Records department.  Phone#: [phone] Fax#: [phone]

## 2022-03-09 NOTE — Telephone Encounter (Signed)
Attempt to return call-unable to reach or leave VM 

## 2022-03-09 NOTE — Telephone Encounter (Signed)
Would defer to PCP on home health recommendations

## 2022-03-10 ENCOUNTER — Encounter (INDEPENDENT_AMBULATORY_CARE_PROVIDER_SITE_OTHER): Payer: Self-pay | Admitting: Primary Care

## 2022-03-11 ENCOUNTER — Telehealth: Payer: Self-pay | Admitting: Cardiology

## 2022-03-11 MED ORDER — ROSUVASTATIN CALCIUM 10 MG PO TABS: 10.0000 mg | ORAL_TABLET | Freq: Every day | ORAL | 0 refills | Status: DC

## 2022-03-11 NOTE — Telephone Encounter (Signed)
*  STAT* If patient is at the pharmacy, call can be transferred to refill team.   1. Which medications need to be refilled? (please list name of each medication and dose if known) rosuvastatin (CRESTOR) 10 MG tablet  2. Which pharmacy/location (including street and city if local pharmacy) is medication to be sent to? [place], [place]  3. Do they need a 30 day or 90 day supply? 90   Patient has appt on 06/14/22

## 2022-03-15 ENCOUNTER — Other Ambulatory Visit (INDEPENDENT_AMBULATORY_CARE_PROVIDER_SITE_OTHER): Payer: Self-pay | Admitting: Primary Care

## 2022-03-15 ENCOUNTER — Encounter (HOSPITAL_COMMUNITY): Payer: Medicare Other | Admitting: Physical Therapy

## 2022-03-15 DIAGNOSIS — J449 Chronic obstructive pulmonary disease, unspecified: Secondary | ICD-10-CM

## 2022-03-15 DIAGNOSIS — E1100 Type 2 diabetes mellitus with hyperosmolarity without nonketotic hyperglycemic-hyperosmolar coma (NKHHC): Secondary | ICD-10-CM

## 2022-03-15 DIAGNOSIS — Z760 Encounter for issue of repeat prescription: Secondary | ICD-10-CM

## 2022-03-16 ENCOUNTER — Ambulatory Visit (HOSPITAL_COMMUNITY): Payer: Medicare Other | Attending: Orthopedic Surgery

## 2022-03-16 ENCOUNTER — Other Ambulatory Visit (INDEPENDENT_AMBULATORY_CARE_PROVIDER_SITE_OTHER): Payer: Self-pay | Admitting: Primary Care

## 2022-03-16 ENCOUNTER — Encounter (HOSPITAL_COMMUNITY): Payer: Self-pay

## 2022-03-16 DIAGNOSIS — F3340 Major depressive disorder, recurrent, in remission, unspecified: Secondary | ICD-10-CM

## 2022-03-16 DIAGNOSIS — R2689 Other abnormalities of gait and mobility: Secondary | ICD-10-CM | POA: Diagnosis not present

## 2022-03-16 DIAGNOSIS — M25561 Pain in right knee: Secondary | ICD-10-CM | POA: Diagnosis not present

## 2022-03-16 DIAGNOSIS — M25662 Stiffness of left knee, not elsewhere classified: Secondary | ICD-10-CM | POA: Diagnosis not present

## 2022-03-16 DIAGNOSIS — Z760 Encounter for issue of repeat prescription: Secondary | ICD-10-CM

## 2022-03-16 DIAGNOSIS — M25562 Pain in left knee: Secondary | ICD-10-CM | POA: Insufficient documentation

## 2022-03-16 DIAGNOSIS — G8929 Other chronic pain: Secondary | ICD-10-CM | POA: Insufficient documentation

## 2022-03-16 NOTE — Therapy (Signed)
OUTPATIENT PHYSICAL THERAPY LOWER EXTREMITY TREATMENT   Patient Name: Rhonda Hickman MRN: [id number] DOB:01/29/1956, 66 y.o., female Today's Date: 03/16/2022   PT End of Session - 03/16/22 1603     Visit Number 2    Number of Visits 12    Date for PT Re-Evaluation 04/07/22    Authorization Type [employer] Medicare/ [employer]  2ndary    Progress Note Due on Visit 10    PT Start Time 1519    PT Stop Time 1600    PT Time Calculation (min) 41 min    Activity Tolerance Patient tolerated treatment well;Patient limited by pain    Behavior During Therapy WFL for tasks assessed/performed             Past Medical History:  Diagnosis Date   Anemia    Anxiety    Asthma    Atrial fibrillation (HCC)    Bipolar 1 disorder ([place])    Bulging lumbar disc    Chronic pain of left knee    COPD (chronic obstructive pulmonary disease) (HCC)    CVA (cerebral vascular accident) ([place])    Diabetes mellitus    Neuropathy    TIA (transient ischemic attack)    Vertigo    Past Surgical History:  Procedure Laterality Date   ABDOMINAL HYSTERECTOMY     CARPAL TUNNEL RELEASE Right    CHOLECYSTECTOMY     KNEE ARTHROSCOPY WITH LATERAL MENISECTOMY Left 06/08/2021   Procedure: KNEE ARTHROSCOPY WITH LATERAL MENISCECTOMY AND MEDIAL MENISCECTOMY;  Surgeon: Carole Civil, MD;  Location: [facility];  Service: Orthopedics;  Laterality: Left;   KNEE ARTHROSCOPY WITH MEDIAL MENISECTOMY Right 04/14/2016   Procedure: KNEE ARTHROSCOPY WITH MEDIAL AND LATERAL MENISECTOMY, MICROFRACTURE REPAIR;  Surgeon: Carole Civil, MD;  Location: [facility];  Service: Orthopedics;  Laterality: Right;  lateral menisectomy - needs crutch training   Patient Active Problem List   Diagnosis Date Noted   S/P left knee arthroscopy11/29/22 07/13/2021   Tear of meniscus of knee joint    Acute lower UTI 05/23/2021   A-fib ([place]) 05/22/2021   Leukocytosis 05/22/2021   Fall at home, initial encounter 05/22/2021    Class: Acute    HLD (hyperlipidemia) 05/22/2021   Hypertension 03/18/2020   Bipolar 1 disorder ([place])    Chronic atrial fibrillation ([place]) 12/03/2019   TIA (transient ischemic attack) 12/03/2019   Diabetic peripheral neuropathy ([place]) 12/03/2019   Chronic migraine 12/03/2019   Vertigo 08/03/2019   Bronchiectasis with acute exacerbation (HCC)    Obesity, Class III, BMI 40-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) ([place])    Gastroesophageal reflux disease    Asthma exacerbation 06/06/2018   Gait abnormality 08/29/2017   Paresthesia 08/29/2017   Derangement of posterior horn of medial meniscus of right knee    Meniscus, lateral, derangement, right    Arthritis of knee, right    COPD with acute exacerbation ([place]) 03/09/2016   Type 2 diabetes mellitus ([place]) 02/16/2007   Cocaine abuse ([place]) 02/16/2007   Extrinsic asthma 02/16/2007   HOMELESSNESS, HX OF 02/16/2007    PCP: [name] NP  REFERRING PROVIDER: Carole Civil, MD   REFERRING DIAG: [phone] (ICD-10-CM) - S/P left knee arthroscopy M51.36 (ICD-10-CM) - DDD (degenerative disc disease), lumbar   THERAPY DIAG:  Other abnormalities of gait and mobility  Left knee pain, unspecified chronicity  Right knee pain, unspecified chronicity  Chronic pain of left knee  Stiffness of left knee, not elsewhere classified  Rationale for Evaluation and Treatment Rehabilitation  ONSET DATE:  October 2022  SUBJECTIVE:   SUBJECTIVE STATEMENT: 03/16/22:  Pt arrived in [facility] with aide, reports she has began the HEP without questions.  Reports pain scale 9/10 Bil knee pain.    Eval subjective: Patient presents to therapy with complaint of bilateral knee pain. She reports having surgery on both knees in the past 1-2 years. She states that she was able to walk fine until a few months after she had knee surgery. She says she had surgery on LT knee in October and by February she couldn't walk. She was referred to back specialist in [place] and told she had a pinched nerve.  She states her back doesn't bother her, but her knees hurt and she has trouble walking.   PERTINENT HISTORY: Bilateral knee surgery, chronic LBP   PAIN:  Are you having pain? Yes: NPRS scale: 9/10 Pain location: both knees  Pain description: throbbing, aggravating, "running water"  Aggravating factors: unsure, WB  Relieving factors: laying, elevation feet   PRECAUTIONS: Fall  WEIGHT BEARING RESTRICTIONS No  FALLS:  Has patient fallen in last 6 months? Yes. Number of falls 4-5  LIVING ENVIRONMENT: Lives with: lives with their family and lives alone Lives in: House/apartment Stairs:  ramp Has following equipment at home: Single point cane, Environmental consultant - 2 wheeled, Wheelchair (manual), and Ramped entry  OCCUPATION: Disability   PLOF: Needs assistance with ADLs  PATIENT GOALS "I want to walk and be out of pain"    OBJECTIVE:   DIAGNOSTIC FINDINGS: IMPRESSION: Advanced facet degenerative change at L4-5 results in 0.3 cm anterolisthesis. There is mild central canal stenosis and mild to moderate bilateral foraminal narrowing at L4-5.   Mild left foraminal narrowing at L3-4 due to a small foraminal protrusion.    PATIENT SURVEYS:  FOTO (3% knee/ 28% back)  COGNITION:  Overall cognitive status: Within functional limits for tasks assessed     LOWER EXTREMITY MMT:  MMT Right eval Left eval Right 03/16/22 Left  03/16/22  Hip flexion 5 5    Hip extension      Hip abduction   2+/5 2/5  Hip adduction      Hip internal rotation      Hip external rotation      Knee flexion      Knee extension 4 4-    Ankle dorsiflexion 5 5    Ankle plantarflexion      Ankle inversion      Ankle eversion       (Blank rows = not tested)  ROW Rt 18-116 degrees   Lt 24-100 degrees  FUNCTIONAL TESTS:  STS labored, heavy use of UE assist, hops from chair to bed   GAIT: Distance walked: 8 feet Assistive device utilized: Walker - 2 wheeled Level of assistance: Modified independence and  SBA Comments: trunk flexed, drags RT foot, heavy reliance on Ues     TODAY'S TREATMENT: Reviewed goals Gait training WC to mat 41f with RW MMT fot glut med Sidelying abd ROM measurements Supine: 10 quad sets 5" holds.  Hamstring stretch 3x 30" Seated LAQ   Eval Laq Quad set Seated heel slide   PATIENT EDUCATION:  Education details: on eval findings, POC and HEP Person educated: Patient Education method: Explanation Education comprehension: verbalized understanding   HOME EXERCISE PROGRAM: Access Code: [email or web address]: [email or web address] Date: 02/24/2022 Prepared by: CJosue Hector Exercises - Supine Quad Set  - 3 x daily - 7 x weekly - 1 sets - 10 reps - 5 second hold -  Seated Long Arc Quad  - 3 x daily - 7 x weekly - 1 sets - 10 reps - 5 second hold - Seated Knee Flexion Extension AROM   - 3 x daily - 7 x weekly - 1 sets - 10 reps - 5 second hold  03/16/22: Hamstring stretch with rope 3x 30"  ASSESSMENT:  CLINICAL IMPRESSION: Reviewed goals, educated importance of HEP compliance, pt able to recall current exercise program.  Pt arrived with WC, gait training with RW with mod A for short distance due to fatigue.  Pt demonstrate very limited knee extension.  ROM measurements complete with limited extension BLE.  Pt educated on importance of improving ROM to assist with gait mechanics.  MMT complete for glut med with significant weakness noted as well.  Therex focus on quad strenghtening and additional hamstring stretches to improve knee extension.  EOS pt reports pain reduced to 8/10.  Given hamstring stretch as additional HEP.    OBJECTIVE IMPAIRMENTS Abnormal gait, decreased activity tolerance, decreased balance, decreased mobility, difficulty walking, decreased ROM, decreased strength, impaired flexibility, improper body mechanics, and pain.   ACTIVITY LIMITATIONS standing, transfers, and locomotion level  PARTICIPATION LIMITATIONS: meal prep,  cleaning, shopping, and community activity  PERSONAL FACTORS Age, Behavior pattern, Past/current experiences, Time since onset of injury/illness/exacerbation, and 3+ comorbidities: Stroke, DM, HTN  are also affecting patient's functional outcome.   REHAB POTENTIAL: Fair Stroke, DM, HTN  CLINICAL DECISION MAKING: Stable/uncomplicated  EVALUATION COMPLEXITY: Low   GOALS: SHORT TERM GOALS: Target date: 03/17/2022  Patient will be independent with initial HEP and self-management strategies to improve functional outcomes Baseline:  Goal status: IN PROGRESS    LONG TERM GOALS: Target date: 04/07/2022  Patient will be independent with advanced HEP and self-management strategies to improve functional outcomes Baseline:  Goal status: IN PROGRESS  2.  Patient will improve FOTO score to predicted value to indicate improvement in functional outcomes Baseline: 3% knee 28% lumbar Goal status: IN PROGRESS  3.  Patient will report reduction of knee pain to <5/10 for improved quality of life and ability to perform ADLs  Baseline: 9/10 Goal status: IN PROGRESS  4. Patient will have equal to or > 4+/5 MMT throughout BLE to improve ability to perform functional mobility, stair ambulation and ADLs.  Baseline: See MMT Goal status: IN PROGRESS  5. Patient will be able to ambulate at least 150 feet during 2MWT with LRAD to demonstrate improved ability to perform functional mobility and associated tasks. Baseline: 8 feet Goal status: IN PROGRESS   PLAN: PT FREQUENCY: 3x/week  PT DURATION: 6 weeks  PLANNED INTERVENTIONS: Therapeutic exercises, Therapeutic activity, Neuromuscular re-education, Balance training, Gait training, Patient/Family education, Joint manipulation, Joint mobilization, Stair training, Aquatic Therapy, Dry Needling, Electrical stimulation, Spinal manipulation, Spinal mobilization, Cryotherapy, Moist heat, scar mobilization, Taping, Traction, Ultrasound, Biofeedback,  Ionotophoresis '4mg'$ /ml Dexamethasone, and Manual therapy.   PLAN FOR NEXT SESSION: Progress functional strength as tolerated (seated>standing> balance> gait).  Add SAQ and extension based exercises.    Ihor Austin, LPTA/CLT; CBIS [phone] 5:48 PM, 03/16/22

## 2022-03-17 ENCOUNTER — Encounter (HOSPITAL_COMMUNITY): Payer: Medicare Other | Admitting: Physical Therapy

## 2022-03-21 ENCOUNTER — Ambulatory Visit (HOSPITAL_COMMUNITY): Payer: Medicare Other | Admitting: Physical Therapy

## 2022-03-23 ENCOUNTER — Encounter (HOSPITAL_COMMUNITY): Payer: Medicare Other | Admitting: Physical Therapy

## 2022-03-24 ENCOUNTER — Ambulatory Visit (HOSPITAL_COMMUNITY): Payer: Medicare Other

## 2022-03-26 ENCOUNTER — Other Ambulatory Visit: Payer: Self-pay | Admitting: Family Medicine

## 2022-03-26 DIAGNOSIS — M6281 Muscle weakness (generalized): Secondary | ICD-10-CM | POA: Diagnosis not present

## 2022-03-26 DIAGNOSIS — R3981 Functional urinary incontinence: Secondary | ICD-10-CM | POA: Diagnosis not present

## 2022-03-28 ENCOUNTER — Encounter (HOSPITAL_COMMUNITY): Payer: Medicare Other | Admitting: Physical Therapy

## 2022-03-29 ENCOUNTER — Telehealth (INDEPENDENT_AMBULATORY_CARE_PROVIDER_SITE_OTHER): Payer: Self-pay

## 2022-03-29 NOTE — Telephone Encounter (Signed)
Wanting to confirm how often Patient is to get home health, she stated should be 7 days/week. Requesting PA for 28 hours/week.

## 2022-03-30 ENCOUNTER — Ambulatory Visit: Admission: RE | Admit: 2022-03-30 | Payer: Medicaid Other | Source: Ambulatory Visit

## 2022-03-30 ENCOUNTER — Ambulatory Visit (HOSPITAL_COMMUNITY): Payer: Medicare Other

## 2022-03-30 ENCOUNTER — Ambulatory Visit (INDEPENDENT_AMBULATORY_CARE_PROVIDER_SITE_OTHER): Payer: Medicare Other | Admitting: Primary Care

## 2022-03-30 ENCOUNTER — Encounter (INDEPENDENT_AMBULATORY_CARE_PROVIDER_SITE_OTHER): Payer: Self-pay | Admitting: Primary Care

## 2022-03-30 VITALS — BP 112/76 | HR 66 | Resp 16

## 2022-03-30 DIAGNOSIS — N3942 Incontinence without sensory awareness: Secondary | ICD-10-CM | POA: Diagnosis not present

## 2022-03-30 DIAGNOSIS — E669 Obesity, unspecified: Secondary | ICD-10-CM

## 2022-03-30 DIAGNOSIS — E1100 Type 2 diabetes mellitus with hyperosmolarity without nonketotic hyperglycemic-hyperosmolar coma (NKHHC): Secondary | ICD-10-CM | POA: Diagnosis not present

## 2022-03-30 DIAGNOSIS — Z760 Encounter for issue of repeat prescription: Secondary | ICD-10-CM | POA: Diagnosis not present

## 2022-03-30 DIAGNOSIS — Z794 Long term (current) use of insulin: Secondary | ICD-10-CM

## 2022-03-30 DIAGNOSIS — Z0000 Encounter for general adult medical examination without abnormal findings: Secondary | ICD-10-CM

## 2022-03-30 LAB — GLUCOSE, POCT (MANUAL RESULT ENTRY): POC Glucose: 148 mg/dl — AB (ref 70–99)

## 2022-03-30 MED ORDER — OXYBUTYNIN CHLORIDE 5 MG PO TABS: ORAL_TABLET | ORAL | 0 refills | Status: DC

## 2022-03-30 MED ORDER — OZEMPIC (0.25 OR 0.5 MG/DOSE) 2 MG/3ML ~~LOC~~ SOPN: 0.5000 mg | PEN_INJECTOR | SUBCUTANEOUS | 1 refills | Status: DC

## 2022-03-30 MED ORDER — RIVAROXABAN 20 MG PO TABS: ORAL_TABLET | ORAL | 1 refills | Status: DC

## 2022-03-30 NOTE — Progress Notes (Unsigned)
[id number]

## 2022-03-31 ENCOUNTER — Other Ambulatory Visit: Payer: Medicare Other

## 2022-03-31 NOTE — Progress Notes (Signed)
Subjective:  Patient ID: Rhonda Hickman, female    DOB: [birth year]  Age: 66 y.o. MRN: [id number]  CC: Diabetes and Hypertension   HPI Rhonda Hickman is a 66 year old obese female who presents in a wheelchair difficulty walking after her second surgery of her left knee replacement.  She is follow-up of diabetes. Patient does not check blood sugar at home.  The management of hypertension Patient has No headache, No chest pain, No abdominal pain - No Nausea, No new weakness tingling or numbness, No Cough - shortness of breath  Compliant with meds - No Checking CBGs? No  Fasting avg -   Postprandial average -  Exercising regularly? - No Watching carbohydrate intake? - Yes Neuropathy ? - No Hypoglycemic events - No  - Recovers with :   Pertinent ROS:  Polyuria - No Polydipsia - No Vision problems - No  Medications as noted below. Taking them regularly without complication/adverse reaction being reported today.   History Rhonda Hickman has a past medical history of Anemia, Anxiety, Asthma, Atrial fibrillation ([place]), Bipolar 1 disorder ([place]), Bulging lumbar disc, Chronic pain of left knee, COPD (chronic obstructive pulmonary disease) ([place]), CVA (cerebral vascular accident) ([place]), Diabetes mellitus, Neuropathy, TIA (transient ischemic attack), and Vertigo.   She has a past surgical history that includes Abdominal hysterectomy; Carpal tunnel release (Right); Knee arthroscopy with medial menisectomy (Right, 04/14/2016); Cholecystectomy; and Knee arthroscopy with lateral menisectomy (Left, 06/08/2021).   Her family history includes Diabetes in her mother; Heart attack in her father.She reports that she has been smoking cigarettes. She has been smoking an average of .5 packs per day. She has never used smokeless tobacco. She reports that she does not drink alcohol and does not use drugs.  Current Outpatient Medications on File Prior to Visit  Medication Sig Dispense Refill   albuterol (VENTOLIN  HFA) 108 (90 Base) MCG/ACT inhaler INHALE 2 PUFFS EVERY 6 HOURS AS NEEDED FOR WHEEZING OR SHORTNESS OF BREATH 18 g 1   amLODipine (NORVASC) 5 MG tablet TAKE 1 TABLET BY MOUTH DAILY 100 tablet 2   blood glucose meter kit and supplies Dispense based on patient and insurance preference. Use up to four times daily as directed. (FOR ICD-10 E10.9, E11.9). 1 each 0   buPROPion (WELLBUTRIN SR) 150 MG 12 hr tablet Take  one tablet twice daily 180 tablet 2   calcium-vitamin D (OSCAL WITH D) 500-200 MG-UNIT tablet TAKE (1) TABLET DAILY WITH BREAKFAST. 90 tablet 1   citalopram (CELEXA) 40 MG tablet TAKE ONE TABLET BY MOUTH DAILY AT 9AM 90 tablet 11   divalproex (DEPAKOTE ER) 500 MG 24 hr tablet Take 1-2 tablets by mouth 2 (two) times daily. One tablet in the morning and 2 tablets at bedtime     FEROSUL 325 (65 Fe) MG tablet TAKE ONE TABLET BY MOUTH DAILY AT 9AM WITH BREAKFAST 90 tablet 11   fluticasone (FLONASE) 50 MCG/ACT nasal spray Place 2 sprays into both nostrils daily. 16 g 6   glucose blood (ACCU-CHEK GUIDE) test strip CHECK BLOOD SUGAR UP TO FOUR TIMES DAILY AS DIRECTED 100 strip 11   GNP ULTICARE PEN NEEDLES 32G X 4 MM MISC USE TO INJECT INSULIN UP TO 5 TIMES A DAY 200 each 6   Insulin Glargine (BASAGLAR KWIKPEN) 100 UNIT/ML Inject 24 Units into the skin 2 (two) times daily. 15 mL 3   Insulin Syringe-Needle U-100 (ADVOCATE INSULIN SYRINGE) 29G X 1/2" 0.3 ML MISC Use to inject insulin 5x daily. 200 each 6  metFORMIN (GLUCOPHAGE) 1000 MG tablet Take 1 tablet (1,000 mg total) by mouth 2 (two) times daily with a meal. 180 tablet 1   metoprolol tartrate (LOPRESSOR) 50 MG tablet Take 1 tablet (50 mg total) by mouth 2 (two) times daily. 90 tablet 0   oxyCODONE-acetaminophen (PERCOCET) 7.5-325 MG tablet Take 1 tablet by mouth every 4 (four) hours as needed for severe pain. 30 tablet 0   PEG-KCl-NaCl-NaSulf-Na Asc-C (PLENVU) 140 g SOLR Take 140 g by mouth as directed. Manufacturer's coupon Universal coupon  code:BIN: P2366821; GROUP: [id number]; PCN: [id number]; ID: [id number]; PAY NO MORE $50 1 each 0   rosuvastatin (CRESTOR) 10 MG tablet Take 1 tablet (10 mg total) by mouth daily. KEEP UPCOMING APPOINTMENT FOR FUTURE REFILLS. 90 tablet 0   SYMBICORT 160-4.5 MCG/ACT inhaler INHALE TWO PUFFS BY MOUTH TWICE DAILY (BULK) 10.2 g 11   No current facility-administered medications on file prior to visit.    ROS Comprehensive ROS Pertinent positive and negative noted in HPI    Objective:  BP 112/76   Pulse 66   Resp 16   SpO2 100%   BP Readings from Last 3 Encounters:  03/30/22 112/76  02/25/22 129/76  10/26/21 105/74    Wt Readings from Last 3 Encounters:  02/25/22 269 lb 9.6 oz (122.3 kg)  01/27/22 277 lb (125.6 kg)  08/03/21 277 lb (125.6 kg)    Physical Exam Vitals reviewed.  Constitutional:      Appearance: She is obese.  HENT:     Head: Normocephalic.     Right Ear: External ear normal.     Left Ear: External ear normal.     Nose: Nose normal.  Eyes:     Extraocular Movements: Extraocular movements intact.  Cardiovascular:     Rate and Rhythm: Normal rate and regular rhythm.  Pulmonary:     Effort: Pulmonary effort is normal.     Breath sounds: Normal breath sounds.  Abdominal:     General: Bowel sounds are normal. There is distension.     Palpations: Abdomen is soft.  Musculoskeletal:     Cervical back: Normal range of motion and neck supple.     Comments: Decreased range of motion unable to ambulate without assistance uses wheelchair to move around  Skin:    General: Skin is warm and dry.  Neurological:     Mental Status: She is alert and oriented to person, place, and time.  Psychiatric:        Mood and Affect: Mood normal.        Behavior: Behavior normal.   Lab Results  Component Value Date   HGBA1C 8.3 (A) 02/25/2022   HGBA1C 7.5 (A) 10/06/2021   HGBA1C 8.9 (H) 05/22/2021    Lab Results  Component Value Date   WBC 10.2 05/24/2021   HGB 10.7 (L)  05/24/2021   HCT 32.1 (L) 05/24/2021   PLT 205 05/24/2021   GLUCOSE 161 (H) 05/24/2021   CHOL 100 03/10/2021   TRIG 135 03/10/2021   HDL 28 (L) 03/10/2021   LDLCALC 48 03/10/2021   ALT 34 (H) 03/10/2021   AST 20 03/10/2021   NA 136 05/24/2021   K 3.3 (L) 05/24/2021   CL 105 05/24/2021   CREATININE 1.04 (H) 05/24/2021   BUN 14 05/24/2021   CO2 24 05/24/2021   TSH 1.280 05/22/2021   HGBA1C 8.3 (A) 02/25/2022     Assessment & Plan:   Nakeya was seen today for diabetes and hypertension.  Diagnoses and  all orders for this visit:  Type 2 diabetes mellitus with 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, with long-term current use of insulin (HCC) A1c has increased to 8.3 previously 7.5.  She states she has been out of her Ozempic she moved packed them up in a box.  Will reorder however if this due she will have to pay out pocket expense her insurance may not pay for a early refill.  Continue to monitor her carbohydrates such as grits, breads, sweets, rice, potatoes, and macaroni and cheese -     Cancel: Microalbumin / creatinine urine ratio -     Ambulatory referral to Gastroenterology -     POCT glucose (manual entry) -     Ambulatory referral to Ophthalmology -     Semaglutide,0.25 or 0.5MG/DOS, (OZEMPIC, 0.25 OR 0.5 MG/DOSE,) 2 MG/3ML SOPN; Inject 0.5 mg into the skin once a week.  Health care maintenance -     Cancel: Microalbumin / creatinine urine ratio -     Ambulatory referral to Gastroenterology -     Cancel: HCV Ab w Reflex to Quant PCR -     DG Bone Density; Future  Urinary incontinence without sensory awareness This has been a problem since her stroke -     oxybutynin (DITROPAN) 5 MG tablet; TAKE 1 TABLET 2 TO 3 TIMES A DAY FOR BLADDER SPASMS  Medication refill -     oxybutynin (DITROPAN) 5 MG tablet; TAKE 1 TABLET 2 TO 3 TIMES A DAY FOR BLADDER SPASMS -     rivaroxaban (XARELTO) 20 MG TABS tablet; TAKE ONE TABLET BY MOUTH WITH SUPPER   I am having Sherrin Daisy maintain  her Plenvu, fluticasone, Advocate Insulin Syringe, calcium-vitamin D, albuterol, divalproex, oxyCODONE-acetaminophen, blood glucose meter kit and supplies, Accu-Chek Guide, buPROPion, GNP UltiCare Pen Needles, Basaglar KwikPen, FeroSul, metFORMIN, amLODipine, metoprolol tartrate, rosuvastatin, Symbicort, citalopram, Ozempic (0.25 or 0.5 MG/DOSE), oxybutynin, and rivaroxaban.  Meds ordered this encounter  Medications   Semaglutide,0.25 or 0.5MG/DOS, (OZEMPIC, 0.25 OR 0.5 MG/DOSE,) 2 MG/3ML SOPN    Sig: Inject 0.5 mg into the skin once a week.    Dispense:  6 mL    Refill:  1    Order Specific Question:   Supervising Provider    Answer:   Tresa Garter [[id number]]   oxybutynin (DITROPAN) 5 MG tablet    Sig: TAKE 1 TABLET 2 TO 3 TIMES A DAY FOR BLADDER SPASMS    Dispense:  270 tablet    Refill:  0    Order Specific Question:   Supervising Provider    Answer:   Tresa Garter [[id number]]   rivaroxaban (XARELTO) 20 MG TABS tablet    Sig: TAKE ONE TABLET BY MOUTH WITH SUPPER    Dispense:  90 tablet    Refill:  1    Order Specific Question:   Supervising Provider    AnswerTresa Garter [id number]     Follow-up:   Return in about 2 months (around 05/30/2022) for DM.  The above assessment and management plan was discussed with the patient. The patient verbalized understanding of and has agreed to the management plan. Patient is aware to call the clinic if symptoms fail to improve or worsen. Patient is aware when to return to the clinic for a follow-up visit. Patient educated on when it is appropriate to go to the emergency department.   [name], NP-C

## 2022-04-01 ENCOUNTER — Ambulatory Visit: Payer: Medicare Other | Admitting: Pharmacist

## 2022-04-01 ENCOUNTER — Telehealth (INDEPENDENT_AMBULATORY_CARE_PROVIDER_SITE_OTHER): Payer: Self-pay | Admitting: Primary Care

## 2022-04-01 ENCOUNTER — Encounter (HOSPITAL_COMMUNITY): Payer: Medicare Other | Admitting: Physical Therapy

## 2022-04-01 ENCOUNTER — Ambulatory Visit (INDEPENDENT_AMBULATORY_CARE_PROVIDER_SITE_OTHER): Payer: Self-pay | Admitting: *Deleted

## 2022-04-01 NOTE — Telephone Encounter (Signed)
Can you help me reschedule this patient for my next available? If it's further out that's okay.

## 2022-04-01 NOTE — Telephone Encounter (Signed)
Pt is calling to reschedule appt with [facility] for today. Please advise [phone]

## 2022-04-01 NOTE — Telephone Encounter (Signed)
   Chief Complaint: diarrhea Symptoms: 3 x this morning Frequency: started last night after eating chinese food Pertinent Negatives: Patient denies fever, vomiting, does feel nauseated Disposition: '[]'$ ED /'[]'$ Urgent Care (no appt availability in office) / '[]'$ Appointment(In office/virtual)/ '[]'$  [place] [facility]/ '[x]'$ Home Care/ '[]'$ Refused Recommended Disposition /'[]'$  Mobile Bus/ '[]'$  Follow-up with PCP Additional Notes: Pt called to cancel appt with Lurena Joiner due to diarrhea and does not want to chance being out. Home care given about hydration and what to call back for. She will reschedule with Lurena Joiner when she is sure she can come.  Reason for Disposition  MILD-MODERATE diarrhea (e.g., 1-6 times / day more than normal)  Answer Assessment - Initial Assessment Questions 1. DIARRHEA SEVERITY: "How bad is the diarrhea?" "How many more stools have you had in the past 24 hours than normal?"    - NO DIARRHEA (SCALE 0)   - MILD (SCALE 1-3): Few loose or mushy BMs; increase of 1-3 stools over normal daily number of stools; mild increase in ostomy output.   -  MODERATE (SCALE 4-7): Increase of 4-6 stools daily over normal; moderate increase in ostomy output.   -  SEVERE (SCALE 8-10; OR "WORST POSSIBLE"): Increase of 7 or more stools daily over normal; moderate increase in ostomy output; incontinence.     3 times today, watery 2. ONSET: "When did the diarrhea begin?"      During night 3. BM CONSISTENCY: "How loose or watery is the diarrhea?"      watery 4. VOMITING: "Are you also vomiting?" If Yes, ask: "How many times in the past 24 hours?"      Nauseated, no vomiting 5. ABDOMEN PAIN: "Are you having any abdomen pain?" If Yes, ask: "What does it feel like?" (e.g., crampy, dull, intermittent, constant)      At the top of abd like right up under breast, had gall bladder out and is not supposed to eat food like she had last night 6. ABDOMEN PAIN SEVERITY: If present, ask: "How bad is the pain?"  (e.g.,  Scale 1-10; mild, moderate, or severe)   - MILD (1-3): doesn't interfere with normal activities, abdomen soft and not tender to touch    - MODERATE (4-7): interferes with normal activities or awakens from sleep, abdomen tender to touch    - SEVERE (8-10): excruciating pain, doubled over, unable to do any normal activities       5 7. ORAL INTAKE: If vomiting, "Have you been able to drink liquids?" "How much liquids have you had in the past 24 hours?"     Has not tried yet 8. HYDRATION: "Any signs of dehydration?" (e.g., dry mouth [not just dry lips], too weak to stand, dizziness, new weight loss) "When did you last urinate?"    Reviewed signs 9. EXPOSURE: "Have you traveled to a foreign country recently?" "Have you been exposed to anyone with diarrhea?" "Could you have eaten any food that was spoiled?"     [place] food last night 10. ANTIBIOTIC USE: "Are you taking antibiotics now or have you taken antibiotics in the past 2 months?"       no 11. OTHER SYMPTOMS: "Do you have any other symptoms?" (e.g., fever, blood in stool)       no 12. PREGNANCY: "Is there any chance you are pregnant?" "When was your last menstrual period?"       no  Protocols used: [facility]

## 2022-04-04 ENCOUNTER — Encounter (HOSPITAL_COMMUNITY): Payer: Medicare Other | Admitting: Physical Therapy

## 2022-04-04 NOTE — Telephone Encounter (Signed)
Spoke with patient she has paper work Copy through [facility] home care assistant

## 2022-04-04 NOTE — Telephone Encounter (Signed)
I returned call to [facility] [phone] and the call was answered by Lady Saucier., Provider Services. He cannot find a Cameo L. And there is no other number to reach that individual. He is not able to assist.  Reference # [id number].  The patient is attending outpatient PT so she would not be eligible for home health aid.  If she is requesting personal care services, that would be under her Medicaid plan

## 2022-04-05 ENCOUNTER — Inpatient Hospital Stay: Admission: RE | Admit: 2022-04-05 | Payer: Medicaid Other | Source: Ambulatory Visit

## 2022-04-06 ENCOUNTER — Encounter (HOSPITAL_COMMUNITY): Payer: Medicare Other | Admitting: Physical Therapy

## 2022-04-07 ENCOUNTER — Other Ambulatory Visit (INDEPENDENT_AMBULATORY_CARE_PROVIDER_SITE_OTHER): Payer: Self-pay | Admitting: Primary Care

## 2022-04-07 DIAGNOSIS — Z760 Encounter for issue of repeat prescription: Secondary | ICD-10-CM

## 2022-04-07 DIAGNOSIS — E1100 Type 2 diabetes mellitus with hyperosmolarity without nonketotic hyperglycemic-hyperosmolar coma (NKHHC): Secondary | ICD-10-CM

## 2022-04-07 NOTE — Telephone Encounter (Signed)
Requested by interface surescripts. Last refill 03/08/22 #180 1 RF Requested Prescriptions  Refused Prescriptions Disp Refills  . metFORMIN (GLUCOPHAGE) 1000 MG tablet [Pharmacy Med Name: metformin 1,000 mg tablet] 180 tablet 0    Sig: TAKE ONE TABLET BY MOUTH TWICE DAILY @ 9AM & 5PM WITH MEALS     Endocrinology:  Diabetes - Biguanides Failed - 04/07/2022  5:31 AM      Failed - Cr in normal range and within 360 days    Creatinine, Ser  Date Value Ref Range Status  05/24/2021 1.04 (H) 0.44 - 1.00 mg/dL Final   Creatinine, Urine  Date Value Ref Range Status  12/17/2019 128.15 mg/dL Final    Comment:    Performed at [facility], [address] [place], [place] [address]         Failed - HBA1C is between 0 and 7.9 and within 180 days    Hemoglobin A1C  Date Value Ref Range Status  02/25/2022 8.3 (A) 4.0 - 5.6 % Final   Hgb A1c MFr Bld  Date Value Ref Range Status  05/22/2021 8.9 (H) 4.8 - 5.6 % Final    Comment:    (NOTE) Pre diabetes:          5.7%-6.4%  Diabetes:              >6.4%  Glycemic control for   <7.0% adults with diabetes          Passed - eGFR in normal range and within 360 days    GFR calc Af Amer  Date Value Ref Range Status  03/17/2020 >60 >60 mL/min Final   GFR, Estimated  Date Value Ref Range Status  05/24/2021 >60 >60 mL/min Final    Comment:    (NOTE) Calculated using the CKD-EPI Creatinine Equation (2021)    eGFR  Date Value Ref Range Status  03/10/2021 61 >59 mL/min/1.73 Final         Passed - B12 Level in normal range and within 720 days    Vitamin B-12  Date Value Ref Range Status  06/02/2020 460 232 - 1,245 pg/mL Final         Passed - Valid encounter within last 6 months    Recent Outpatient Visits          1 week ago Type 2 diabetes mellitus with 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, with long-term current use of insulin ([place])   [facility] [name], NP   1 month ago Type 2 diabetes mellitus with  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, with long-term current use of insulin ([place])   [place] [facility] [name] P, NP   6 months ago Type 2 diabetes mellitus with 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, with long-term current use of insulin ([place])   [place], Michelle P, NP   8 months ago Type 2 diabetes mellitus with 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, without long-term current use of insulin ([place])   [place] [facility] [name], NP   1 year ago Type 2 diabetes mellitus with 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, with long-term current use of insulin ([place])   [place] [facility] [name], NP      Future Appointments            In 1 month Oletta Lamas, [place], NP Short   In 2 months [name], [facility]           Passed - CBC within normal limits and completed in the last 12 months    WBC  Date Value Ref Range Status  05/24/2021 10.2 4.0 - 10.5 K/uL Final   RBC  Date Value Ref Range Status  05/24/2021 3.62 (L) 3.87 - 5.11 MIL/uL Final   Hemoglobin  Date Value Ref Range Status  05/24/2021 10.7 (L) 12.0 - 15.0 g/dL Final  08/05/2020 12.8 11.1 - 15.9 g/dL Final   HCT  Date Value Ref Range Status  05/24/2021 32.1 (L) 36.0 - 46.0 % Final   Hematocrit  Date Value Ref Range Status  08/05/2020 38.9 34.0 - 46.6 % Final   MCHC  Date Value Ref Range Status  05/24/2021 33.3 30.0 - 36.0 g/dL Final   [facility]  Date Value Ref Range Status  05/24/2021 29.6 26.0 - 34.0 pg Final   MCV  Date Value Ref Range Status  05/24/2021 88.7 80.0 - 100.0 fL Final  08/05/2020 88 79 - 97 fL Final   No results found for: "PLTCOUNTKUC", "LABPLAT", "POCPLA" RDW  Date Value Ref Range Status  05/24/2021 14.2 11.5 - 15.5 % Final  08/05/2020 13.8 11.7 - 15.4 % Final

## 2022-04-08 ENCOUNTER — Encounter (HOSPITAL_COMMUNITY): Payer: Medicare Other | Admitting: Physical Therapy

## 2022-04-12 ENCOUNTER — Encounter (HOSPITAL_COMMUNITY): Payer: Medicare Other | Admitting: Physical Therapy

## 2022-04-13 ENCOUNTER — Telehealth (INDEPENDENT_AMBULATORY_CARE_PROVIDER_SITE_OTHER): Payer: Self-pay | Admitting: Primary Care

## 2022-04-13 NOTE — Telephone Encounter (Signed)
Pt states her insurance will no longer cover  Insulin Glargine (BASAGLAR KWIKPEN) 100 UNIT/ML The ones that are now covered: [place] Levemir Lantus  Insurance dr line [phone] Pt is out of her insuline, and needs today please.  Can you send one Rx to local [place], [place]  The rest to  [place], [place]

## 2022-04-13 NOTE — Telephone Encounter (Signed)
Will forward to provider  

## 2022-04-14 ENCOUNTER — Encounter (HOSPITAL_COMMUNITY): Payer: Medicare Other | Admitting: Physical Therapy

## 2022-04-14 ENCOUNTER — Other Ambulatory Visit (INDEPENDENT_AMBULATORY_CARE_PROVIDER_SITE_OTHER): Payer: Self-pay | Admitting: Primary Care

## 2022-04-14 ENCOUNTER — Telehealth (INDEPENDENT_AMBULATORY_CARE_PROVIDER_SITE_OTHER): Payer: Self-pay | Admitting: Primary Care

## 2022-04-14 DIAGNOSIS — E1100 Type 2 diabetes mellitus with hyperosmolarity without nonketotic hyperglycemic-hyperosmolar coma (NKHHC): Secondary | ICD-10-CM

## 2022-04-14 MED ORDER — LANTUS SOLOSTAR 100 UNIT/ML ~~LOC~~ SOPN: 20.0000 [IU] | PEN_INJECTOR | Freq: Two times a day (BID) | SUBCUTANEOUS | 0 refills | Status: DC

## 2022-04-14 MED ORDER — INSULIN GLARGINE 100 UNIT/ML ~~LOC~~ SOLN: 20.0000 [IU] | Freq: Two times a day (BID) | SUBCUTANEOUS | 11 refills | Status: DC

## 2022-04-14 NOTE — Telephone Encounter (Signed)
Pt will need to be seen in person for Welcome to [facility]

## 2022-04-14 NOTE — Telephone Encounter (Signed)
I don't see where pt is scheduled for a Wellness visit and if its welcome to medicare she has to be seen in person

## 2022-04-14 NOTE — Telephone Encounter (Signed)
Called patient change medication to Lantus 20 units twice daily to prescription sent 1 to the local pharmacy in [place] and the other 1 to [place] for delivery

## 2022-04-14 NOTE — Telephone Encounter (Signed)
Copied from [place]. Topic: Appointment Scheduling - Scheduling Inquiry for Clinic >> Apr 14, 2022  1:32 PM Oley Balm E wrote: Reason for CRM: Can pt be seen virtually for medicare AWV or does she have to be seen in the office for her welcome to medicare visit? If so, can she receive questionnaire during her upcoming appt?   Please respond via teams [place]

## 2022-04-15 ENCOUNTER — Encounter (HOSPITAL_COMMUNITY): Payer: Medicare Other | Admitting: Physical Therapy

## 2022-04-18 ENCOUNTER — Encounter (HOSPITAL_COMMUNITY): Payer: Medicare Other | Admitting: Physical Therapy

## 2022-04-19 ENCOUNTER — Encounter (HOSPITAL_COMMUNITY): Payer: Medicare Other

## 2022-04-19 NOTE — Telephone Encounter (Signed)
I have spoken with the patient and determined that she has not had her Welcome to Medicare physical since going on Medicare in June of this year. I informed her that I am canceling the AWV scheduled for 04-22-22 via telephone and I will call her and schedule her in person visit.

## 2022-04-20 ENCOUNTER — Encounter (HOSPITAL_COMMUNITY): Payer: Medicare Other | Admitting: Physical Therapy

## 2022-04-22 ENCOUNTER — Ambulatory Visit (INDEPENDENT_AMBULATORY_CARE_PROVIDER_SITE_OTHER): Payer: Medicare Other

## 2022-04-22 ENCOUNTER — Encounter (HOSPITAL_COMMUNITY): Payer: Medicare Other

## 2022-04-27 ENCOUNTER — Ambulatory Visit (INDEPENDENT_AMBULATORY_CARE_PROVIDER_SITE_OTHER): Payer: Self-pay

## 2022-04-27 NOTE — Telephone Encounter (Signed)
  Chief Complaint: diarrhea  Symptoms: diarrhea, watery, black stools Frequency: 2 months but went away and started back this week  Pertinent Negatives: Patient denies abdominal pain or vomiting  Disposition: '[x]'$ ED /'[]'$ Urgent Care (no appt availability in office) / '[]'$ Appointment(In office/virtual)/ '[]'$  [place] Virtual [facility]/ '[]'$ Home Care/ '[]'$ Refused Recommended Disposition /'[]'$  Mobile Bus/ '[]'$  Follow-up with PCP Additional Notes: pt states her stools are very black and watery. Not recently taken any abx or Pepto > 2 days. Pt is on daily iron but not new medication. Pt states she can drink water and have diarrhea. I advised pt to go to ED and be seen, pt preferred appt d/t transportation issues, scheduled appt for 04/29/22 at 0910 but recommended if she can go to ED before then that would be best. Pt verbalized understanding.   Reason for Disposition  Black or tarry bowel movements  (Exception: Chronic-unchanged black-grey BMs AND is taking iron pills or Pepto-Bismol.)  Answer Assessment - Initial Assessment Questions 1. DIARRHEA SEVERITY: "How bad is the diarrhea?" "How many more stools have you had in the past 24 hours than normal?"    - NO DIARRHEA (SCALE 0)   - MILD (SCALE 1-3): Few loose or mushy BMs; increase of 1-3 stools over normal daily number of stools; mild increase in ostomy output.   -  MODERATE (SCALE 4-7): Increase of 4-6 stools daily over normal; moderate increase in ostomy output.   -  SEVERE (SCALE 8-10; OR "WORST POSSIBLE"): Increase of 7 or more stools daily over normal; moderate increase in ostomy output; incontinence.     3 or more  2. ONSET: "When did the diarrhea begin?"      2 months but started back this week  3. BM CONSISTENCY: "How loose or watery is the diarrhea?"      Watery  5. ABDOMEN PAIN: "Are you having any abdomen pain?" If Yes, ask: "What does it feel like?" (e.g., crampy, dull, intermittent, constant)      no 8. HYDRATION: "Any signs of  dehydration?" (e.g., dry mouth [not just dry lips], too weak to stand, dizziness, new weight loss) "When did you last urinate?"     no 10. ANTIBIOTIC USE: "Are you taking antibiotics now or have you taken antibiotics in the past 2 months?"       no 11. OTHER SYMPTOMS: "Do you have any other symptoms?" (e.g., fever, blood in stool)       Black stools  Protocols used: Diarrhea-A-AH

## 2022-04-29 ENCOUNTER — Ambulatory Visit (INDEPENDENT_AMBULATORY_CARE_PROVIDER_SITE_OTHER): Payer: Medicare Other | Admitting: Primary Care

## 2022-04-29 ENCOUNTER — Inpatient Hospital Stay: Admission: RE | Admit: 2022-04-29 | Payer: Medicaid Other | Source: Ambulatory Visit

## 2022-05-03 ENCOUNTER — Encounter (INDEPENDENT_AMBULATORY_CARE_PROVIDER_SITE_OTHER): Payer: Medicare Other | Admitting: Primary Care

## 2022-05-05 ENCOUNTER — Telehealth (INDEPENDENT_AMBULATORY_CARE_PROVIDER_SITE_OTHER): Payer: Self-pay | Admitting: Primary Care

## 2022-05-05 NOTE — Telephone Encounter (Signed)
Copied from [place] [id number]. Topic: General - Other >> May 05, 2022  9:18 AM Everette C wrote: Reason for CRM: Lola with [employer] has called to request that a member of staff contact the patient to reschedule their Welcome to Medicare appt  Please contact the patient further when possible   *Jay'a, can you look at the two appts scheduled for this patient and see which one the patient wants?

## 2022-05-05 NOTE — Telephone Encounter (Signed)
Pt is schedule for 11/7

## 2022-05-11 ENCOUNTER — Other Ambulatory Visit (INDEPENDENT_AMBULATORY_CARE_PROVIDER_SITE_OTHER): Payer: Self-pay | Admitting: Primary Care

## 2022-05-11 DIAGNOSIS — F3340 Major depressive disorder, recurrent, in remission, unspecified: Secondary | ICD-10-CM

## 2022-05-11 DIAGNOSIS — Z760 Encounter for issue of repeat prescription: Secondary | ICD-10-CM

## 2022-05-11 DIAGNOSIS — Z794 Long term (current) use of insulin: Secondary | ICD-10-CM

## 2022-05-11 DIAGNOSIS — Z720 Tobacco use: Secondary | ICD-10-CM

## 2022-05-11 MED ORDER — CITALOPRAM HYDROBROMIDE 40 MG PO TABS: ORAL_TABLET | ORAL | 1 refills | Status: DC

## 2022-05-11 MED ORDER — METFORMIN HCL 1000 MG PO TABS: 1000.0000 mg | ORAL_TABLET | Freq: Two times a day (BID) | ORAL | 1 refills | Status: DC

## 2022-05-12 ENCOUNTER — Other Ambulatory Visit: Payer: Medicare Other

## 2022-05-17 ENCOUNTER — Ambulatory Visit (INDEPENDENT_AMBULATORY_CARE_PROVIDER_SITE_OTHER): Payer: Medicare Other | Admitting: Primary Care

## 2022-05-17 ENCOUNTER — Encounter (INDEPENDENT_AMBULATORY_CARE_PROVIDER_SITE_OTHER): Payer: Self-pay | Admitting: Primary Care

## 2022-05-17 VITALS — BP 115/74 | HR 79 | Resp 16 | Ht 72.0 in | Wt 268.8 lb

## 2022-05-17 DIAGNOSIS — Z0000 Encounter for general adult medical examination without abnormal findings: Secondary | ICD-10-CM

## 2022-05-17 NOTE — Progress Notes (Signed)
Subjective:    Rhonda Hickman is a 66 y.o. female who presents for a Welcome to Medicare exam.   Review of Systems Right leg painful and swollen history of bilateral total knee replacement.  Patient is unable to walk without assistance she uses a wheelchair to help maintain her independence.        Objective:    Today's Vitals   05/17/22 1500  BP: 115/74  Pulse: 79  Resp: 16  SpO2: 99%  Weight: 268 lb 12.8 oz (121.9 kg)  Height: 6' (1.829 m)  Body mass index is 36.46 kg/m.  Medications Outpatient Encounter Medications as of 05/17/2022  Medication Sig   albuterol (VENTOLIN HFA) 108 (90 Base) MCG/ACT inhaler INHALE 2 PUFFS EVERY 6 HOURS AS NEEDED FOR WHEEZING OR SHORTNESS OF BREATH   amLODipine (NORVASC) 5 MG tablet TAKE 1 TABLET BY MOUTH DAILY   blood glucose meter kit and supplies Dispense based on patient and insurance preference. Use up to four times daily as directed. (FOR ICD-10 E10.9, E11.9).   buPROPion (WELLBUTRIN SR) 150 MG 12 hr tablet Take  one tablet twice daily   calcium-vitamin D (OSCAL WITH D) 500-200 MG-UNIT tablet TAKE (1) TABLET DAILY WITH BREAKFAST.   citalopram (CELEXA) 40 MG tablet TAKE ONE TABLET BY MOUTH DAILY AT 9AM   divalproex (DEPAKOTE ER) 500 MG 24 hr tablet Take 1-2 tablets by mouth 2 (two) times daily. One tablet in the morning and 2 tablets at bedtime   FEROSUL 325 (65 Fe) MG tablet TAKE ONE TABLET BY MOUTH DAILY AT 9AM WITH BREAKFAST   fluticasone (FLONASE) 50 MCG/ACT nasal spray Place 2 sprays into both nostrils daily.   glucose blood (ACCU-CHEK GUIDE) test strip CHECK BLOOD SUGAR UP TO FOUR TIMES DAILY AS DIRECTED   GNP ULTICARE PEN NEEDLES 32G X 4 MM MISC USE TO INJECT INSULIN UP TO 5 TIMES A DAY   insulin glargine (LANTUS SOLOSTAR) 100 UNIT/ML Solostar Pen Inject 20 Units into the skin 2 (two) times daily.   insulin glargine (LANTUS) 100 UNIT/ML injection Inject 0.2 mLs (20 Units total) into the skin 2 (two) times daily.   Insulin  Syringe-Needle U-100 (ADVOCATE INSULIN SYRINGE) 29G X 1/2" 0.3 ML MISC Use to inject insulin 5x daily.   metFORMIN (GLUCOPHAGE) 1000 MG tablet Take 1 tablet (1,000 mg total) by mouth 2 (two) times daily with a meal.   metoprolol tartrate (LOPRESSOR) 50 MG tablet Take 1 tablet (50 mg total) by mouth 2 (two) times daily.   oxybutynin (DITROPAN) 5 MG tablet TAKE 1 TABLET 2 TO 3 TIMES A DAY FOR BLADDER SPASMS   oxyCODONE-acetaminophen (PERCOCET) 7.5-325 MG tablet Take 1 tablet by mouth every 4 (four) hours as needed for severe pain.   PEG-KCl-NaCl-NaSulf-Na Asc-C (PLENVU) 140 g SOLR Take 140 g by mouth as directed. Manufacturer's coupon Universal coupon code:BIN: P2366821; GROUP: [id number]; PCN: [id number]; ID: [id number]; PAY NO MORE $50   rivaroxaban (XARELTO) 20 MG TABS tablet TAKE ONE TABLET BY MOUTH WITH SUPPER   rosuvastatin (CRESTOR) 10 MG tablet Take 1 tablet (10 mg total) by mouth daily. KEEP UPCOMING APPOINTMENT FOR FUTURE REFILLS.   Semaglutide,0.25 or 0.5MG/DOS, (OZEMPIC, 0.25 OR 0.5 MG/DOSE,) 2 MG/3ML SOPN Inject 0.5 mg into the skin once a week.   SYMBICORT 160-4.5 MCG/ACT inhaler INHALE TWO PUFFS BY MOUTH TWICE DAILY (BULK)   No facility-administered encounter medications on file as of 05/17/2022.     History: Past Medical History:  Diagnosis Date   Anemia  Anxiety    Asthma    Atrial fibrillation (HCC)    Bipolar 1 disorder (HCC)    Bulging lumbar disc    Chronic pain of left knee    COPD (chronic obstructive pulmonary disease) (HCC)    CVA (cerebral vascular accident) ([place])    Diabetes mellitus    Neuropathy    TIA (transient ischemic attack)    Vertigo    Past Surgical History:  Procedure Laterality Date   ABDOMINAL HYSTERECTOMY     CARPAL TUNNEL RELEASE Right    CHOLECYSTECTOMY     KNEE ARTHROSCOPY WITH LATERAL MENISECTOMY Left 06/08/2021   Procedure: KNEE ARTHROSCOPY WITH LATERAL MENISCECTOMY AND MEDIAL MENISCECTOMY;  Surgeon: Carole Civil, MD;  Location: [facility];  Service: Orthopedics;  Laterality: Left;   KNEE ARTHROSCOPY WITH MEDIAL MENISECTOMY Right 04/14/2016   Procedure: KNEE ARTHROSCOPY WITH MEDIAL AND LATERAL MENISECTOMY, MICROFRACTURE REPAIR;  Surgeon: Carole Civil, MD;  Location: [facility];  Service: Orthopedics;  Laterality: Right;  lateral menisectomy - needs crutch training    Family History  Problem Relation Age of Onset   Diabetes Mother    Heart attack Father    Social History   Occupational History   Occupation: Unemployed  Tobacco Use   Smoking status: Some Days    Packs/day: 0.50    Types: Cigarettes   Smokeless tobacco: Never   Tobacco comments:    tobacco info given  Vaping Use   Vaping Use: Some days  Substance and Sexual Activity   Alcohol use: No   Drug use: No   Sexual activity: Never    Tobacco Counseling Ready to quit: Not Answered Counseling given: Not Answered Tobacco comments: tobacco info given   Immunizations and Health Maintenance Immunization History  Administered Date(s) Administered   Janssen (J&J) SARS-COV-2 Vaccination 10/10/2019   Health Maintenance Due  Topic Date Due   OPHTHALMOLOGY EXAM  Never done   Hepatitis C Screening  Never done   PAP SMEAR-Modifier  Never done   COLONOSCOPY (Pts 45-76yr Insurance coverage will need to be confirmed)  Never done   MAMMOGRAM  Never done   COVID-19 Vaccine (2 - Booster for [employer]) 12/05/2019   FOOT EXAM  04/06/2021   DEXA SCAN  Never done   Diabetic kidney evaluation - Urine ACR  03/10/2022   Diabetic kidney evaluation - GFR measurement  05/24/2022    Activities of Daily Living    05/17/2022    2:58 PM 06/02/2021    2:09 PM  In your present state of health, do you have any difficulty performing the following activities:  Hearing? 0   Vision? 1   Difficulty concentrating or making decisions? 0   Walking or climbing stairs? 1   Dressing or bathing? 0   Doing errands, shopping? 0 0  Preparing Food and eating ? N   Using  the Toilet? N   In the past six months, have you accidently leaked urine? Y   Do you have problems with loss of bowel control? Y   Managing your Medications? N   Managing your Finances? N   Housekeeping or managing your Housekeeping? N     Physical Exam  (optional), or other factors deemed appropriate based on the beneficiary's medical and social history and current clinical standards. General: No apparent distress. Eyes: Extraocular eye movements intact, pupils equal and round. Neck: Supple, trachea midline. Thyroid: No enlargement, mobile without fixation, no tenderness. Cardiovascular: Regular rhythm and rate, no murmur, normal radial  pulses. Respiratory: Normal respiratory effort, clear to auscultation. Gastrointestinal: Normal pitch active bowel sounds, nontender abdomen without distention or appreciable hepatomegaly. Musculoskeletal: Bilateral lower extremity weakness  skin: Appropriate warmth, no visible rash. Mental status: Alert, conversant, speech clear, thought logical, appropriate mood and affect, no hallucinations or delusions evident. Hematologic/lymphatic: No cervical adenopathy, no visible ecchymoses.  Advanced Directives: Does Patient Have a Medical Advance Directive?: No Would patient like information on creating a medical advance directive?: No - Patient declined    Assessment:    This is a routine wellness examination for this patient .   Vision/Hearing screen Unable to read chart she left her glasses at home she did see the E  Dietary issues and exercise activities discussed:   Difficult to exercise lower extremities with bilateral chronic knee pain.  She can do some chair exercises which was demonstrated and she is very much aware of what she should and should not eat.   Goals   None   Depression Screen    05/17/2022    2:57 PM 02/25/2022   10:35 AM 10/06/2021    3:24 PM 08/03/2021   11:05 AM  PHQ 2/9 Scores  PHQ - 2 Score 0 _0 PHQ- 9 Score  _1 Fall Risk    05/17/2022    2:56 PM  Fall Risk   Falls in the past year? 1  Number falls in past yr: 1  Injury with Fall? 0    Cognitive Function:    05/17/2022    2:59 PM  MMSE - Mini Mental State Exam  Orientation to time 5  Orientation to Place 5  Registration 3  Attention/ Calculation 5  Recall 3  Language- name 2 objects 2  Language- repeat 1  Language- follow 3 step command 3  Language- read & follow direction 1  Write a sentence 1  Copy design 1  Total score 30        Patient Care Team: [name], NP as PCP - General (Internal Medicine) [name], MD as PCP - Cardiology (Cardiology) Carole Civil, MD as Consulting Physician (Orthopedic Surgery) [name], MD as Consulting Physician (Cardiology)     Plan:    I have personally reviewed and noted the following in the patient's chart:   Medical and social history Use of alcohol, tobacco or illicit drugs  Current medications and supplements Functional ability and status Nutritional status Physical activity Advanced directives List of other physicians Hospitalizations, surgeries, and ER visits in previous 12 months Vitals Screenings to include cognitive, depression, and falls Referrals and appointments  In addition, I have reviewed and discussed with patient certain preventive protocols, quality metrics, and best practice recommendations. A written personalized care plan for preventive services as well as general preventive health recommendations were provided to patient.     [name], NP 05/17/2022

## 2022-05-20 ENCOUNTER — Other Ambulatory Visit (INDEPENDENT_AMBULATORY_CARE_PROVIDER_SITE_OTHER): Payer: Self-pay | Admitting: Primary Care

## 2022-05-20 DIAGNOSIS — E1100 Type 2 diabetes mellitus with hyperosmolarity without nonketotic hyperglycemic-hyperosmolar coma (NKHHC): Secondary | ICD-10-CM

## 2022-05-20 DIAGNOSIS — Z760 Encounter for issue of repeat prescription: Secondary | ICD-10-CM

## 2022-05-20 NOTE — Telephone Encounter (Signed)
Call to patient- she has enough medication until her appointment and she does not need Rx today- advised [facility]- she will monitor her medications to make sure she is not taking double dose. Requested Prescriptions  Pending Prescriptions Disp Refills   metFORMIN (GLUCOPHAGE) 1000 MG tablet [Pharmacy Med Name: metFORMIN HCl 1000 MG Oral Tablet] 200 tablet 2    Sig: TAKE 1 TABLET BY MOUTH TWICE  DAILY WITH MEALS     Endocrinology:  Diabetes - Biguanides Failed - 05/20/2022  7:17 AM      Failed - Cr in normal range and within 360 days    Creatinine, Ser  Date Value Ref Range Status  05/24/2021 1.04 (H) 0.44 - 1.00 mg/dL Final   Creatinine, Urine  Date Value Ref Range Status  12/17/2019 128.15 mg/dL Final    Comment:    Performed at [facility], [address], [place], [place] [address]         Failed - HBA1C is between 0 and 7.9 and within 180 days    Hemoglobin A1C  Date Value Ref Range Status  02/25/2022 8.3 (A) 4.0 - 5.6 % Final   Hgb A1c MFr Bld  Date Value Ref Range Status  05/22/2021 8.9 (H) 4.8 - 5.6 % Final    Comment:    (NOTE) Pre diabetes:          5.7%-6.4%  Diabetes:              >6.4%  Glycemic control for   <7.0% adults with diabetes          Failed - eGFR in normal range and within 360 days    GFR calc Af Amer  Date Value Ref Range Status  03/17/2020 >60 >60 mL/min Final   GFR, Estimated  Date Value Ref Range Status  05/24/2021 >60 >60 mL/min Final    Comment:    (NOTE) Calculated using the CKD-EPI Creatinine Equation (2021)    eGFR  Date Value Ref Range Status  03/10/2021 61 >59 mL/min/1.73 Final         Failed - CBC within normal limits and completed in the last 12 months    WBC  Date Value Ref Range Status  05/24/2021 10.2 4.0 - 10.5 K/uL Final   RBC  Date Value Ref Range Status  05/24/2021 3.62 (L) 3.87 - 5.11 MIL/uL Final   Hemoglobin  Date Value Ref Range Status  05/24/2021 10.7 (L) 12.0 - 15.0 g/dL Final   08/05/2020 12.8 11.1 - 15.9 g/dL Final   HCT  Date Value Ref Range Status  05/24/2021 32.1 (L) 36.0 - 46.0 % Final   Hematocrit  Date Value Ref Range Status  08/05/2020 38.9 34.0 - 46.6 % Final   MCHC  Date Value Ref Range Status  05/24/2021 33.3 30.0 - 36.0 g/dL Final   [facility]  Date Value Ref Range Status  05/24/2021 29.6 26.0 - 34.0 pg Final   MCV  Date Value Ref Range Status  05/24/2021 88.7 80.0 - 100.0 fL Final  08/05/2020 88 79 - 97 fL Final   No results found for: "PLTCOUNTKUC", "LABPLAT", "POCPLA" RDW  Date Value Ref Range Status  05/24/2021 14.2 11.5 - 15.5 % Final  08/05/2020 13.8 11.7 - 15.4 % Final         Passed - B12 Level in normal range and within 720 days    Vitamin B-12  Date Value Ref Range Status  06/02/2020 460 232 - 1,245 pg/mL Final  Passed - Valid encounter within last 6 months    Recent Outpatient Visits           3 days ago Encounter for Medicare annual wellness exam   [place] [name], NP   1 month ago Type 2 diabetes mellitus with 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, with long-term current use of insulin ([place])   [place], Lindsay P, NP   2 months ago Type 2 diabetes mellitus with 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, with long-term current use of insulin ([place])   [place], [place] P, NP   7 months ago Type 2 diabetes mellitus with 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, with long-term current use of insulin ([place])   [place], [place] P, NP   9 months ago Type 2 diabetes mellitus with 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, without long-term current use of insulin ([place])   [place], [place], NP       Future Appointments             In 2 weeks Tresa Endo, [place]   In 3 weeks [name], MD [place] [facility] [place]   In 1 month [place], Milford Cage, NP [place]

## 2022-05-25 ENCOUNTER — Telehealth (INDEPENDENT_AMBULATORY_CARE_PROVIDER_SITE_OTHER): Payer: Self-pay | Admitting: Primary Care

## 2022-05-25 NOTE — Telephone Encounter (Signed)
I spoke to Ches/ [employer] regarding the request for an increase in PCS hours. The patient currently receives 3 hours/day x 5 days/week. Ches spoke to the agency that provides the aide and they confirmed the need for an increase in hours.    Ches said that they received a request for an increase in hours in June and that was denied.    The change in medical status request must be completed and signed by the provider and step 6 must also be completed indicating the change in patient's status warranting the increase in PCS hours.

## 2022-05-25 NOTE — Telephone Encounter (Signed)
Opal Sidles would you be able to take care of this

## 2022-05-25 NOTE — Telephone Encounter (Signed)
Caller states pt is requesting additional pcs hours and a PA need to be completed  Fax [phone]

## 2022-05-25 NOTE — Telephone Encounter (Signed)
Yes, I will send to you for Sharyn Lull to sign

## 2022-05-26 ENCOUNTER — Other Ambulatory Visit (INDEPENDENT_AMBULATORY_CARE_PROVIDER_SITE_OTHER): Payer: Self-pay | Admitting: Primary Care

## 2022-05-26 DIAGNOSIS — Z720 Tobacco use: Secondary | ICD-10-CM

## 2022-05-26 MED ORDER — BUPROPION HCL ER (SR) 150 MG PO TB12: ORAL_TABLET | ORAL | 0 refills | Status: DC

## 2022-05-26 NOTE — Telephone Encounter (Signed)
Medication Refill - Medication: buPROPion (WELLBUTRIN SR) 150 MG 12 hr tablet   Has the patient contacted their pharmacy? Yes.    Preferred Pharmacy (with phone number or street name):  [facility] - [place], [place] [address] Phone: [phone]  Fax: [phone]     Has the patient been seen for an appointment in the last year OR does the patient have an upcoming appointment? Yes.    Agent: Please be advised that RX refills may take up to 3 business days. We ask that you follow-up with your pharmacy.

## 2022-05-26 NOTE — Telephone Encounter (Signed)
Future visit in 3 weeks . Requested Prescriptions  Pending Prescriptions Disp Refills   buPROPion (WELLBUTRIN SR) 150 MG 12 hr tablet 180 tablet 0    Sig: Take  one tablet twice daily     Psychiatry: Antidepressants - bupropion Failed - 05/26/2022 12:00 PM      Failed - Cr in normal range and within 360 days    Creatinine, Ser  Date Value Ref Range Status  05/24/2021 1.04 (H) 0.44 - 1.00 mg/dL Final   Creatinine, Urine  Date Value Ref Range Status  12/17/2019 128.15 mg/dL Final    Comment:    Performed at [facility], [address] [place], [place] [address]         Failed - AST in normal range and within 360 days    AST  Date Value Ref Range Status  03/10/2021 20 0 - 40 IU/L Final         Failed - ALT in normal range and within 360 days    ALT  Date Value Ref Range Status  03/10/2021 34 (H) 0 - 32 IU/L Final         Passed - Last BP in normal range    BP Readings from Last 1 Encounters:  05/17/22 115/74         Passed - Valid encounter within last 6 months    Recent Outpatient Visits           1 week ago Encounter for Medicare annual wellness exam   [place] [name], NP   1 month ago Type 2 diabetes mellitus with 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, with long-term current use of insulin ([place])   [place], [place] P, NP   3 months ago Type 2 diabetes mellitus with 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, with long-term current use of insulin ([place])   [place], [place] P, NP   7 months ago Type 2 diabetes mellitus with 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, with long-term current use of insulin ([place])   [place], [place] P, NP   9 months ago Type 2 diabetes mellitus with 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, without long-term current use of insulin ([place])   [place], [place], NP       Future Appointments             In 1  week Daisy Blossom, Jarome Matin, [place]   In 2 weeks [name], MD [place] [facility] [place]   In 3 weeks Oletta Lamas Milford Cage, NP [place]

## 2022-05-27 NOTE — Telephone Encounter (Signed)
Pcs form has been faxed

## 2022-05-31 ENCOUNTER — Ambulatory Visit (INDEPENDENT_AMBULATORY_CARE_PROVIDER_SITE_OTHER): Payer: Medicare Other | Admitting: Primary Care

## 2022-06-05 ENCOUNTER — Other Ambulatory Visit (INDEPENDENT_AMBULATORY_CARE_PROVIDER_SITE_OTHER): Payer: Self-pay | Admitting: Primary Care

## 2022-06-05 DIAGNOSIS — E1100 Type 2 diabetes mellitus with hyperosmolarity without nonketotic hyperglycemic-hyperosmolar coma (NKHHC): Secondary | ICD-10-CM

## 2022-06-06 ENCOUNTER — Other Ambulatory Visit (INDEPENDENT_AMBULATORY_CARE_PROVIDER_SITE_OTHER): Payer: Self-pay | Admitting: Primary Care

## 2022-06-06 ENCOUNTER — Inpatient Hospital Stay: Admission: RE | Admit: 2022-06-06 | Payer: Medicaid Other | Source: Ambulatory Visit

## 2022-06-06 DIAGNOSIS — Z760 Encounter for issue of repeat prescription: Secondary | ICD-10-CM

## 2022-06-06 DIAGNOSIS — E1100 Type 2 diabetes mellitus with hyperosmolarity without nonketotic hyperglycemic-hyperosmolar coma (NKHHC): Secondary | ICD-10-CM

## 2022-06-06 DIAGNOSIS — Z720 Tobacco use: Secondary | ICD-10-CM

## 2022-06-07 ENCOUNTER — Telehealth: Payer: Self-pay

## 2022-06-07 ENCOUNTER — Ambulatory Visit: Payer: Medicare Other | Admitting: Pharmacist

## 2022-06-07 NOTE — Telephone Encounter (Signed)
Patient is wanting to get a RX for a hospital bed. Her bed at home is to high and she is still having problems walking.  Please advise [phone].

## 2022-06-08 ENCOUNTER — Telehealth: Payer: Self-pay | Admitting: Radiology

## 2022-06-08 NOTE — Telephone Encounter (Signed)
I did / I advised her she does not meet criteria orthopaedically for a hospital bed. I called again to see if she needed anything further she said everytime she falls she has to call EMS she said she can not walk or stand she was told she needs injections in her back but her back doesnot hurt she can not maintain balance or take a shower due to her frequent falls.  Patient states she does not need a hospital bed what she needs is a handicapped apartment for her knee and her back she has lost a lot of mobility since her knee surgery and her back situation which she is needle phobic and declines treatment for    I told her I will discuss her need with Dr Aline Brochure for a handicapped apartment then call michelle for her to see what she needs to get one if there is one available  [phone]  Ok to recommend a handicapped apartment for her to avoid stairs and have a larger area since she is confined to a wheelchair now due to her back situation. And knee pain

## 2022-06-08 NOTE — Telephone Encounter (Signed)
Patient called LMVM earlier today asking you to call her.

## 2022-06-08 NOTE — Telephone Encounter (Signed)
Call her primary care doctor for that

## 2022-06-08 NOTE — Telephone Encounter (Signed)
I called her, we do not have enough medical need for a hospital bed, but her primary care may be able to help if she has a medical need we are limited to what is in her chart for orthopedics and would not support a hospital bed at this time. I left message to advise.

## 2022-06-09 NOTE — Telephone Encounter (Signed)
I called patient is on waiting list for a handicapped apartment but one not available, there isa 2 bedrooom available but she does not qualify, unless she has a lot of medical equipment needs.   I will call patient to discuss, we are unable to get her any medical equipment she does not meet criteria for the equipment  She will be told when something else opens that is one bedroom.

## 2022-06-12 NOTE — Progress Notes (Unsigned)
**Note Rhonda-Identified via Obfuscation** Cardiology Office Note:    Date:  06/15/2022   ID:  Rhonda Hickman, DOB 1956-02-19, MRN [id number]  PCP:  [name], NP  Cardiologist:  [name], MD  Electrophysiologist:  None   Referring MD: [name], NP   Chief Complaint  Patient presents with   Chest Pain    History of Present Illness:    Rhonda Hickman is a 66 y.o. female with a hx of TIA, atrial fibrillation, BPD, COPD, T2DM, asthma who presents for follow-up.  She was referred by [name], NP for evaluation of atrial fibrillation, initially seen on 12/16/2019.  She reports that she was in the hospital for 5 days in [place] in May 2021.  States that she was admitted with AKI and UTI and found to have atrial fibrillation.  Started on Xarelto.  States that she has been taking this without any bleeding issues.  Reports she has had intermittent palpitations over the last month since her discharge from the hospital.  She reports a history of stabbing chest pain in the center of her chest but has not had any recently, does however chest pressure with exertion.  States that going for walks causes her to have exertional chest pressure.  She is smoking about half a pack per day, down from peak of 2 packs/day.  States that both her mother and father had MIs but unsure of age.  TTE on 08/03/2019 showed LVEF 60-65%, normal RV function, normal biatrial size, no significant valvular disease.  Lexiscan Myoview on 01/03/2020 showed low risk stress test with breast attenuation but no ischemia, LVEF 48%.  Carotid duplex on 01/14/2020 showed bilateral 1 to 39% stenosis.  Zio patch x14 days on 01/29/2020 showed 1% atrial flutter burden, average heart rate 86 bpm.  Zio patch x3 days on 05/25/2020 showed no significant arrhythmias.  Since last clinic visit, she reports she continues to have dizziness, denies any syncope.  Reports has been in wheelchair recently due to knee issues.  Has been having chest  pain.  Has occurred over the last couple of months but worse in the last month.  States that is happening daily, lasting for 15 to 20 minutes.  Denies any dyspnea.  Reports bright red blood per rectum intermittently, states that she is scheduled for colonoscopy.  Also reports some lower extremity edema.  She continues to smoke, currently about 1 pack/week.   Past Medical History:  Diagnosis Date   Anemia    Anxiety    Asthma    Atrial fibrillation ([place])    Bipolar 1 disorder (HCC)    Bulging lumbar disc    Chronic pain of left knee    COPD (chronic obstructive pulmonary disease) (HCC)    CVA (cerebral vascular accident) ([place])    Diabetes mellitus    Neuropathy    TIA (transient ischemic attack)    Vertigo     Past Surgical History:  Procedure Laterality Date   ABDOMINAL HYSTERECTOMY     CARPAL TUNNEL RELEASE Right    CHOLECYSTECTOMY     KNEE ARTHROSCOPY WITH LATERAL MENISECTOMY Left 06/08/2021   Procedure: KNEE ARTHROSCOPY WITH LATERAL MENISCECTOMY AND MEDIAL MENISCECTOMY;  Surgeon: Carole Civil, MD;  Location: [facility];  Service: Orthopedics;  Laterality: Left;   KNEE ARTHROSCOPY WITH MEDIAL MENISECTOMY Right 04/14/2016   Procedure: KNEE ARTHROSCOPY WITH MEDIAL AND LATERAL MENISECTOMY, MICROFRACTURE REPAIR;  Surgeon: Carole Civil, MD;  Location: [facility];  Service: Orthopedics;  Laterality: Right;  lateral  menisectomy - needs crutch training    Current Medications: Current Meds  Medication Sig   albuterol (VENTOLIN HFA) 108 (90 Base) MCG/ACT inhaler INHALE 2 PUFFS EVERY 6 HOURS AS NEEDED FOR WHEEZING OR SHORTNESS OF BREATH   amLODipine (NORVASC) 5 MG tablet TAKE 1 TABLET BY MOUTH DAILY   blood glucose meter kit and supplies Dispense based on patient and insurance preference. Use up to four times daily as directed. (FOR ICD-10 E10.9, E11.9).   buPROPion (WELLBUTRIN SR) 150 MG 12 hr tablet Take  one tablet twice daily   calcium-vitamin D (OSCAL WITH D) 500-200 MG-UNIT  tablet TAKE (1) TABLET DAILY WITH BREAKFAST.   divalproex (DEPAKOTE ER) 500 MG 24 hr tablet Take 1-2 tablets by mouth 2 (two) times daily. One tablet in the morning and 2 tablets at bedtime   FEROSUL 325 (65 Fe) MG tablet TAKE ONE TABLET BY MOUTH DAILY AT 9AM WITH BREAKFAST   fluticasone (FLONASE) 50 MCG/ACT nasal spray Place 2 sprays into both nostrils daily.   glucose blood (ACCU-CHEK GUIDE) test strip CHECK BLOOD SUGAR UP TO FOUR TIMES DAILY AS DIRECTED   GNP ULTICARE PEN NEEDLES 32G X 4 MM MISC USE TO INJECT INSULIN UP TO 5 TIMES A DAY   HYDROcodone-acetaminophen (NORCO) 10-325 MG tablet Take 1 tablet by mouth 4 (four) times daily as needed.   insulin glargine (LANTUS SOLOSTAR) 100 UNIT/ML Solostar Pen Inject 20 Units into the skin 2 (two) times daily.   insulin glargine (LANTUS) 100 UNIT/ML injection Inject 0.2 mLs (20 Units total) into the skin 2 (two) times daily.   Insulin Syringe-Needle U-100 (ADVOCATE INSULIN SYRINGE) 29G X 1/2" 0.3 ML MISC Use to inject insulin 5x daily.   metFORMIN (GLUCOPHAGE) 1000 MG tablet TAKE ONE TABLET BY MOUTH TWICE DAILY @ 9AM & 5PM WITH MEALS   metoprolol tartrate (LOPRESSOR) 50 MG tablet Take 1 tablet (50 mg total) by mouth 2 (two) times daily.   oxybutynin (DITROPAN) 5 MG tablet TAKE 1 TABLET 2 TO 3 TIMES A DAY FOR BLADDER SPASMS   oxyCODONE-acetaminophen (PERCOCET) 7.5-325 MG tablet Take 1 tablet by mouth every 4 (four) hours as needed for severe pain.   OZEMPIC, 0.25 OR 0.5 MG/DOSE, 2 MG/3ML SOPN INJECT SUBCUTANEOUSLY 0.5 MG  EVERY WEEK   rivaroxaban (XARELTO) 20 MG TABS tablet TAKE ONE TABLET BY MOUTH WITH SUPPER   rosuvastatin (CRESTOR) 10 MG tablet Take 1 tablet (10 mg total) by mouth daily. KEEP UPCOMING APPOINTMENT FOR FUTURE REFILLS.   SYMBICORT 160-4.5 MCG/ACT inhaler INHALE TWO PUFFS BY MOUTH TWICE DAILY (BULK)     Allergies:   Aspirin   Social History   Socioeconomic History   Marital status: Single    Spouse name: Not on file   Number of  children: 0   Years of education: 12   Highest education level: High school graduate  Occupational History   Occupation: Unemployed  Tobacco Use   Smoking status: Some Days    Packs/day: 0.50    Types: Cigarettes   Smokeless tobacco: Never   Tobacco comments:    tobacco info given  Vaping Use   Vaping Use: Some days  Substance and Sexual Activity   Alcohol use: No   Drug use: No   Sexual activity: Never  Other Topics Concern   Not on file  Social History Narrative   Lives at home alone.   Right-handed.   No caffeine use.   Social Determinants of Health   Financial Resource Strain: Not on  file  Food Insecurity: Not on file  Transportation Needs: Not on file  Physical Activity: Not on file  Stress: Not on file  Social Connections: Not on file     Family History: The patient's family history includes Diabetes in her mother; Heart attack in her father.  ROS:   Please see the history of present illness.     All other systems reviewed and are negative.  EKGs/Labs/Other Studies Reviewed:    The following studies were reviewed today:   EKG:   06/14/22: Normal sinus rhythm, rate 77, left axis deviation, poor R wave progression  Recent Labs: No results found for requested labs within last 365 days.  Recent Lipid Panel    Component Value Date/Time   CHOL 100 03/10/2021 1147   TRIG 135 03/10/2021 1147   HDL 28 (L) 03/10/2021 1147   CHOLHDL 3.6 03/10/2021 1147   CHOLHDL 11.2 08/04/2019 0548   VLDL 32 08/04/2019 0548   LDLCALC 48 03/10/2021 1147    Physical Exam:    VS:  BP 110/66 (BP Location: Left Arm, Patient Position: Sitting, Cuff Size: Large)   Pulse 77   Ht 6' (1.829 m)   Wt 267 lb 6.4 oz (121.3 kg)   BMI 36.27 kg/m     Wt Readings from Last 3 Encounters:  06/14/22 267 lb 6.4 oz (121.3 kg)  05/17/22 268 lb 12.8 oz (121.9 kg)  02/25/22 269 lb 9.6 oz (122.3 kg)     GEN: in no acute distress HEENT: Normal NECK: No JVD LYMPHATICS: No  lymphadenopathy CARDIAC: RRR, no murmurs, rubs, gallops RESPIRATORY:  Clear to auscultation without rales, wheezing or rhonchi  ABDOMEN: Soft, non-tender, non-distended MUSCULOSKELETAL:  No edema SKIN: Warm and dry NEUROLOGIC:  Alert and oriented x 3 PSYCHIATRIC:  Normal affect   ASSESSMENT:    1. Chest pain of uncertain etiology   2. PAF (paroxysmal atrial fibrillation) ([place])   3. Essential hypertension   4. Hyperlipidemia, unspecified hyperlipidemia type   5. Tobacco use     PLAN:    Chest pain: Atypical in description but does have significant risk factors (type 2 diabetes, hypertension, tobacco use).  TTE on 08/03/2019 showed LVEF 60-65%, normal RV function, normal biatrial size, no significant valvular disease.  Lexiscan Myoview on 01/03/2020 showed low risk stress test with breast attenuation but no ischemia, LVEF 48%.   -Given ongoing chest pain, recommend coronary CTA to evaluate for obstructive CAD.  Will give Lopressor 100 mg prior to study -Echocardiogram  Dizziness: unclear cause.  Head CT negative.  Othostatics in clinic were unremarkable.  Possibly vertigo by description, started on meclizine and referred to neurology for further evaluation.  Brain MRI on 06/16/2020 was unremarkable.  Zio patch x3 days on 05/25/2020 showed no significant arrhythmias.  Atrial fibrillation: CHA2DS2-VASc score 5 (hypertension, diabetes, CVA, female).  Zio patch x14 days on 01/29/2020 showed 1% atrial flutter burden, average heart rate 86 bpm. -Continue Xarelto.   -Continue metoprolol 25 mg twice daily  Hypertension: On amlodipine 5 mg daily, metoprolol 50 mg twice daily.  Appears controlled  T2DM: On insulin.  A1c 8.3% on 02/25/2022  Hyperlipidemia: On rosuvastatin 10 mg daily.  LDL 48 03/10/2021.  We will check lipid panel  Tobacco use: Patient counseled on the risks of tobacco use and cessation strongly encouraged.    TIA: Follows with neurology.  Carotid duplex on 01/14/2020 showed  bilateral 1 to 39% stenosis.  Zio patch x14 days on 01/29/2020 showed 1% atrial flutter burden, average  heart rate 86 bpm.  Snoring: sleep study with mild OSA, no indication for CPAP at this time  Rectal bleeding: Has noted intermittent bright red blood per rectum.  On Xarelto for atrial fibrillation.  Will check CBC.  Reports she is scheduled for colonoscopy later this month  RTC in 6 months  Medication Adjustments/Labs and Tests Ordered: Current medicines are reviewed at length with the patient today.  Concerns regarding medicines are outlined above.  Orders Placed This Encounter  Procedures   CT CORONARY MORPH W/CTA COR W/SCORE W/CA W/CM &/OR WO/CM   Basic metabolic panel   CBC   Lipid panel   EKG 12-Lead   ECHOCARDIOGRAM COMPLETE   No orders of the defined types were placed in this encounter.   Patient Instructions  Medication Instructions:  Your physician recommends that you continue on your current medications as directed. Please refer to the Current Medication list given to you today.  *If you need a refill on your cardiac medications before your next appointment, please call your pharmacy*   Lab Work: Please return for labs PRIOR to CT scan (BMET, CBC, Lipid)  Our in office lab hours are Monday-Friday 8:00-4:00, closed for lunch 12:45-1:45 pm.  No appointment needed.  [facility] locations:   [place] ([place]) - [address] [place] [address] - [address] [place] - [address] ([facility])  Testing/Procedures: Coronary CTA-see instructions below  Your physician has requested that you have an echocardiogram. Echocardiography is a painless test that uses sound waves to  create images of your heart. It provides your doctor with information about the size and shape of your heart and how well your heart's chambers and valves are working. This procedure takes approximately one hour. There are no restrictions for this procedure. Please do NOT wear cologne, perfume, aftershave, or lotions (deodorant is allowed). Please arrive 15 minutes prior to your appointment time.  Follow-Up: At [facility], you and your health needs are our priority.  As part of our continuing mission to provide you with exceptional heart care, we have created designated Provider Care Teams.  These Care Teams include your primary Cardiologist (physician) and Advanced Practice Providers (APPs -  Physician Assistants and Nurse Practitioners) who all work together to provide you with the care you need, when you need it.  We recommend signing up for the patient portal called "MyChart".  Sign up information is provided on this After Visit Summary.  MyChart is used to connect with patients for Virtual Visits (Telemedicine).  Patients are able to view lab/test results, encounter notes, upcoming appointments, etc.  Non-urgent messages can be sent to your provider as well.   To learn more about what you can do with MyChart, go to [email or web address].    Your next appointment:   6 month(s)  The format for your next appointment:   In Person  Provider:   [name], MD     Other Instructions   Your cardiac CT will be scheduled at one of the below locations:   [facility] [address] [place], [place] [address] [phone]   If scheduled at  [facility], please arrive at the [facility] and Children's Entrance (Entrance C2) of [facility] 30 minutes prior to test start time. You can use the FREE valet parking offered at entrance C (encouraged to control the heart rate for the test)  Proceed to the [facility] Radiology Department (first floor)  to check-in and test prep.  All radiology patients and guests should use entrance C2 at [facility], accessed from [facility], even though the hospital's physical address listed is [address]     Please follow these instructions carefully (unless otherwise directed):  On the Night Before the Test: Be sure to Drink plenty of water. Do not consume any caffeinated/decaffeinated beverages or chocolate 12 hours prior to your test. Do not take any antihistamines 12 hours prior to your test.  On the Day of the Test: Drink plenty of water until 1 hour prior to the test. Do not eat any food 1 hour prior to test. You may take your regular medications prior to the test.  Take metoprolol 100 mg (Lopressor) two hours prior to test. FEMALES- please wear underwire-free bra if available, avoid dresses & tight clothing      After the Test: Drink plenty of water. After receiving IV contrast, you may experience a mild flushed feeling. This is normal. On occasion, you may experience a mild rash up to 24 hours after the test. This is not dangerous. If this occurs, you can take Benadryl 25 mg and increase your fluid intake. If you experience trouble breathing, this can be serious. If it is severe call 911 IMMEDIATELY. If it is mild, please call our office. If you take any of these medications: Glipizide/Metformin, Avandament, Glucavance, please do not take 48 hours after completing test unless otherwise instructed.  We will call to schedule your test 2-4 weeks out understanding that some insurance companies will need an authorization prior to the service being performed.   For non-scheduling related questions, please contact the cardiac imaging nurse navigator should you have any questions/concerns: Marchia Bond, Cardiac Imaging Nurse Navigator Gordy Clement, Cardiac Imaging Nurse Navigator [facility] Direct Office Dial: [phone]   For  scheduling needs, including cancellations and rescheduling, please call [place], [phone].            Signed, [name], MD  06/15/2022 8:47 AM    [place]

## 2022-06-14 ENCOUNTER — Encounter: Payer: Self-pay | Admitting: Cardiology

## 2022-06-14 ENCOUNTER — Telehealth (INDEPENDENT_AMBULATORY_CARE_PROVIDER_SITE_OTHER): Payer: Self-pay | Admitting: Primary Care

## 2022-06-14 ENCOUNTER — Telehealth (INDEPENDENT_AMBULATORY_CARE_PROVIDER_SITE_OTHER): Payer: Self-pay

## 2022-06-14 ENCOUNTER — Ambulatory Visit: Payer: Medicare Other | Attending: Cardiology | Admitting: Cardiology

## 2022-06-14 VITALS — BP 110/66 | HR 77 | Ht 72.0 in | Wt 267.4 lb

## 2022-06-14 DIAGNOSIS — I10 Essential (primary) hypertension: Secondary | ICD-10-CM | POA: Diagnosis not present

## 2022-06-14 DIAGNOSIS — I480 Paroxysmal atrial fibrillation: Secondary | ICD-10-CM

## 2022-06-14 DIAGNOSIS — R079 Chest pain, unspecified: Secondary | ICD-10-CM | POA: Diagnosis not present

## 2022-06-14 DIAGNOSIS — E785 Hyperlipidemia, unspecified: Secondary | ICD-10-CM | POA: Diagnosis not present

## 2022-06-14 DIAGNOSIS — Z720 Tobacco use: Secondary | ICD-10-CM

## 2022-06-14 NOTE — Telephone Encounter (Signed)
Copied from [place] [phone]. Topic: General - Inquiry >> Jun 14, 2022 12:07 PM Marcellus Scott wrote: Reason for CRM: Pt stated she has been without aid for two weeks stated [employer] dropped her because she has [place]. Pt stated forms were sent by the [facility] agency last week for [facility] to fill out forms for her aid. Pt seemed confused and overwhelmed.  Pt stated Sharyn Lull needs to send the forms back to [employer], which used to be [employer]. Pt was unsure of the spelling for [employer] and is asking their direct number.  Call dropped, called pt back, no answer.  Pt stated she is only eating cereal because she is scared to use the stove. Please call pt back for assistance.

## 2022-06-14 NOTE — Telephone Encounter (Addendum)
Pt states she does not have [employer] anymore. Pt has [employer], medicare and medicaid.  Pt has not had an aid for 2 weeks.  Pt needs the information for an aid sent to her new insurance company. Pt would really benefit from a call from the nurse.

## 2022-06-14 NOTE — Patient Instructions (Signed)
Medication Instructions:  Your physician recommends that you continue on your current medications as directed. Please refer to the Current Medication list given to you today.  *If you need a refill on your cardiac medications before your next appointment, please call your pharmacy*   Lab Work: Please return for labs PRIOR to CT scan (BMET, CBC, Lipid)  Our in office lab hours are Monday-Friday 8:00-4:00, closed for lunch 12:45-1:45 pm.  No appointment needed.  [facility] locations:   [place] ([place]) - [address] [place] [address] - [address] [place] - [address] ([facility])  Testing/Procedures: Coronary CTA-see instructions below  Your physician has requested that you have an echocardiogram. Echocardiography is a painless test that uses sound waves to create images of your heart. It provides your doctor with information about the size and shape of your heart and how well your heart's chambers and valves are working. This procedure takes approximately one hour. There are no restrictions for this procedure. Please do NOT wear cologne, perfume, aftershave, or lotions (deodorant is allowed). Please arrive 15 minutes prior to your appointment time.  Follow-Up: At [facility], you and your health needs are our priority.  As part of our continuing mission to provide you with exceptional heart care, we have created designated Provider Care Teams.  These Care Teams include your primary Cardiologist (physician) and Advanced Practice Providers (APPs -  Physician Assistants and Nurse Practitioners) who all work together to provide you with the care you need, when you need it.  We recommend  signing up for the patient portal called "MyChart".  Sign up information is provided on this After Visit Summary.  MyChart is used to connect with patients for Virtual Visits (Telemedicine).  Patients are able to view lab/test results, encounter notes, upcoming appointments, etc.  Non-urgent messages can be sent to your provider as well.   To learn more about what you can do with MyChart, go to [email or web address].    Your next appointment:   6 month(s)  The format for your next appointment:   In Person  Provider:   [name], MD     Other Instructions   Your cardiac CT will be scheduled at one of the below locations:   [facility] [address] [place],  [address] [phone]   If scheduled at [facility] please arrive at the [facility] and Children's Entrance (Entrance C2) of [facility] 30 minutes prior to test start time. You can use the FREE valet parking offered at entrance C (encouraged to control the heart rate for the test)  Proceed to the [facility] Radiology Department (first floor) to check-in and test prep.  All radiology patients and guests should use entrance C2 at [facility], accessed from [facility], even though the hospital's physical address listed is [address]     Please follow these instructions carefully (unless otherwise directed):  On the Night Before the Test: Be sure to Drink plenty of water. Do not consume any caffeinated/decaffeinated beverages or chocolate 12 hours prior to your test. Do not take any  antihistamines 12 hours prior to your test.  On the Day of the Test: Drink plenty of water until 1 hour prior to the test. Do not eat any food 1 hour prior to test. You may take your regular medications prior to the test.  Take metoprolol 100 mg (Lopressor) two hours prior to test. FEMALES- please wear underwire-free bra if available, avoid dresses & tight  clothing      After the Test: Drink plenty of water. After receiving IV contrast, you may experience a mild flushed feeling. This is normal. On occasion, you may experience a mild rash up to 24 hours after the test. This is not dangerous. If this occurs, you can take Benadryl 25 mg and increase your fluid intake. If you experience trouble breathing, this can be serious. If it is severe call 911 IMMEDIATELY. If it is mild, please call our office. If you take any of these medications: Glipizide/Metformin, Avandament, Glucavance, please do not take 48 hours after completing test unless otherwise instructed.  We will call to schedule your test 2-4 weeks out understanding that some insurance companies will need an authorization prior to the service being performed.   For non-scheduling related questions, please contact the cardiac imaging nurse navigator should you have any questions/concerns: Marchia Bond, Cardiac Imaging Nurse Navigator Gordy Clement, Cardiac Imaging Nurse Navigator [facility] Direct Office Dial: [phone]   For scheduling needs, including cancellations and rescheduling, please call [place], [phone].

## 2022-06-14 NOTE — Telephone Encounter (Signed)
Opal Sidles I have not received anything for this pt.

## 2022-06-14 NOTE — Telephone Encounter (Signed)
I spoke to the patient and she said Rhonda Hickman is where her PCS aide was from. She has has not have help for 2 weeks. Her Healthy [facility] was converted to regular Medicaid when she received [facility].  A new PCS referral needs to be completed and faxed to [place] fa # [phone]

## 2022-06-14 NOTE — Telephone Encounter (Signed)
Home Health Verbal Orders - Caller/Agency: [facility], Patient Callback Number: [phone]  Requesting pcs service/paitent doesn't have [employer] anymore Frequency: ?

## 2022-06-15 ENCOUNTER — Telehealth: Payer: Self-pay | Admitting: Orthopedic Surgery

## 2022-06-15 NOTE — Telephone Encounter (Signed)
I will send you the PCS for Rhonda Hickman to sign.

## 2022-06-15 NOTE — Telephone Encounter (Signed)
Patient lvm requesting to speak to Dr. Ruthe Mannan assistant, she would like you to call her [phone].

## 2022-06-15 NOTE — Telephone Encounter (Signed)
This has been taken care of.

## 2022-06-15 NOTE — Telephone Encounter (Signed)
Thanks. I called her, we spoke last week also I told her I didn t get anywhere with the information we discussed last week, we can not get her any additional equipment and there is not anything further we can do in regards to our discussion last week  She voiced understanding.

## 2022-06-16 ENCOUNTER — Other Ambulatory Visit (INDEPENDENT_AMBULATORY_CARE_PROVIDER_SITE_OTHER): Payer: Self-pay | Admitting: Primary Care

## 2022-06-16 DIAGNOSIS — Z760 Encounter for issue of repeat prescription: Secondary | ICD-10-CM

## 2022-06-16 NOTE — Telephone Encounter (Signed)
Will forward to provider  

## 2022-06-16 NOTE — Telephone Encounter (Signed)
Requested medication (s) are due for refill today: Yes  Requested medication (s) are on the active medication list: Yes  Last refill:  03/30/22  Future visit scheduled: Yes  Notes to clinic:  Protocol indicates lab work is needed.    Requested Prescriptions  Pending Prescriptions Disp Refills   XARELTO 20 MG TABS tablet [Pharmacy Med Name: Xarelto 20 MG Oral Tablet] 100 tablet 2    Sig: TAKE 1 TABLET BY MOUTH WITH  SUPPER     Hematology: Anticoagulants - rivaroxaban Failed - 06/16/2022  8:35 AM      Failed - ALT in normal range and within 360 days    ALT  Date Value Ref Range Status  03/10/2021 34 (H) 0 - 32 IU/L Final         Failed - AST in normal range and within 360 days    AST  Date Value Ref Range Status  03/10/2021 20 0 - 40 IU/L Final         Failed - Cr in normal range and within 360 days    Creatinine, Ser  Date Value Ref Range Status  05/24/2021 1.04 (H) 0.44 - 1.00 mg/dL Final   Creatinine, Urine  Date Value Ref Range Status  12/17/2019 128.15 mg/dL Final    Comment:    Performed at [facility], [address] [place], [place] [address]         Failed - HCT in normal range and within 360 days    HCT  Date Value Ref Range Status  05/24/2021 32.1 (L) 36.0 - 46.0 % Final   Hematocrit  Date Value Ref Range Status  08/05/2020 38.9 34.0 - 46.6 % Final         Failed - HGB in normal range and within 360 days    Hemoglobin  Date Value Ref Range Status  05/24/2021 10.7 (L) 12.0 - 15.0 g/dL Final  08/05/2020 12.8 11.1 - 15.9 g/dL Final         Failed - PLT in normal range and within 360 days    Platelets  Date Value Ref Range Status  05/24/2021 205 150 - 400 K/uL Final  08/05/2020 201 150 - 450 x10E3/uL Final         Failed - eGFR is 15 or above and within 360 days    GFR calc Af Amer  Date Value Ref Range Status  03/17/2020 >60 >60 mL/min Final   GFR, Estimated  Date Value Ref Range Status  05/24/2021 >60 >60 mL/min Final    Comment:     (NOTE) Calculated using the CKD-EPI Creatinine Equation (2021)    eGFR  Date Value Ref Range Status  03/10/2021 61 >59 mL/min/1.73 Final         Passed - Patient is not pregnant      Passed - Valid encounter within last 12 months    Recent Outpatient Visits           1 month ago Encounter for Medicare annual wellness exam   [place], Michelle P, NP   2 months ago Type 2 diabetes mellitus with 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, with long-term current use of insulin ([place])   [place], Michelle P, NP   3 months ago Type 2 diabetes mellitus with 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, with long-term current use of insulin ([place])   [facility] [name], NP   8 months ago Type 2 diabetes mellitus with  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, with long-term current use of insulin ([place])   [place] [name], NP   10 months ago Type 2 diabetes mellitus with 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, without long-term current use of insulin ([place])   [place], [place], NP       Future Appointments             In 5 days [name], NP [place]   In 3 weeks Daisy Blossom, Jarome Matin, [place]

## 2022-06-16 NOTE — Telephone Encounter (Signed)
Form has been faxed.

## 2022-06-17 ENCOUNTER — Other Ambulatory Visit: Payer: Self-pay | Admitting: Cardiology

## 2022-06-20 ENCOUNTER — Other Ambulatory Visit: Payer: Self-pay

## 2022-06-20 DIAGNOSIS — E1100 Type 2 diabetes mellitus with hyperosmolarity without nonketotic hyperglycemic-hyperosmolar coma (NKHHC): Secondary | ICD-10-CM

## 2022-06-21 ENCOUNTER — Ambulatory Visit (INDEPENDENT_AMBULATORY_CARE_PROVIDER_SITE_OTHER): Payer: Medicare Other | Admitting: Primary Care

## 2022-06-21 ENCOUNTER — Encounter (INDEPENDENT_AMBULATORY_CARE_PROVIDER_SITE_OTHER): Payer: Self-pay | Admitting: Primary Care

## 2022-06-21 VITALS — BP 120/83 | HR 74

## 2022-06-21 DIAGNOSIS — Z794 Long term (current) use of insulin: Secondary | ICD-10-CM

## 2022-06-21 DIAGNOSIS — R269 Unspecified abnormalities of gait and mobility: Secondary | ICD-10-CM | POA: Diagnosis not present

## 2022-06-21 DIAGNOSIS — I152 Hypertension secondary to endocrine disorders: Secondary | ICD-10-CM | POA: Diagnosis not present

## 2022-06-21 DIAGNOSIS — E1100 Type 2 diabetes mellitus with hyperosmolarity without nonketotic hyperglycemic-hyperosmolar coma (NKHHC): Secondary | ICD-10-CM

## 2022-06-21 LAB — POCT GLYCOSYLATED HEMOGLOBIN (HGB A1C)

## 2022-06-21 LAB — CBC
Hematocrit: 39.2 % (ref 34.0–46.6)
Hemoglobin: 12.8 g/dL (ref 11.1–15.9)
MCH: 28.6 pg (ref 26.6–33.0)
MCHC: 32.7 g/dL (ref 31.5–35.7)
MCV: 88 fL (ref 79–97)
Platelets: 259 10*3/uL (ref 150–450)
RBC: 4.48 x10E6/uL (ref 3.77–5.28)
RDW: 14.6 % (ref 11.7–15.4)
WBC: 7.7 10*3/uL (ref 3.4–10.8)

## 2022-06-21 LAB — BASIC METABOLIC PANEL
BUN/Creatinine Ratio: 10 — ABNORMAL LOW (ref 12–28)
BUN: 10 mg/dL (ref 8–27)
CO2: 23 mmol/L (ref 20–29)
Calcium: 9.3 mg/dL (ref 8.7–10.3)
Chloride: 104 mmol/L (ref 96–106)
Creatinine, Ser: 1.02 mg/dL — ABNORMAL HIGH (ref 0.57–1.00)
Glucose: 83 mg/dL (ref 70–99)
Potassium: 4.4 mmol/L (ref 3.5–5.2)
Sodium: 139 mmol/L (ref 134–144)
eGFR: 61 mL/min/{1.73_m2} (ref >59)

## 2022-06-21 LAB — LIPID PANEL
Chol/HDL Ratio: 3.4 ratio (ref 0.0–4.4)
Cholesterol, Total: 104 mg/dL (ref 100–199)
HDL: 31 mg/dL — ABNORMAL LOW (ref >39)
LDL Chol Calc (NIH): 53 mg/dL (ref 0–99)
Triglycerides: 109 mg/dL (ref 0–149)
VLDL Cholesterol Cal: 20 mg/dL (ref 5–40)

## 2022-06-21 NOTE — Progress Notes (Signed)
[facility]  Rhonda Hickman, is a 66 y.o. female  CSN:[id number]  MRN:[id number]  DOB - 05/21/1956 Diabetes follow-up      Subjective:   Rhonda Hickman is a 66 y.o. female here today for a follow up visit for hypertension. Patient has No headache, No chest pain, No abdominal pain - No Nausea, No new weakness tingling or numbness, No Cough - shortness of breath.  Also, type 2 diabetes she denies polyuria, polyphasia, polydipsia or vision changes.  She does report that she has readings in the middle of the night of blood sugars dropping down to 40s and 50s.  She eats something sweet her blood sugar increases and she goes back to sleep.  Her only problem and concerns is chronic left knee pain after surgery.  She has unstable gait and uses a wheelchair which she is able to self propel to remain as independent as possible.  No problems updated.  Allergies  Allergen Reactions   Aspirin Nausea Only and Other (See Comments)    Causes stomach pain    Past Medical History:  Diagnosis Date   Anemia    Anxiety    Asthma    Atrial fibrillation (HCC)    Bipolar 1 disorder (HCC)    Bulging lumbar disc    Chronic pain of left knee    COPD (chronic obstructive pulmonary disease) (HCC)    CVA (cerebral vascular accident) (HCC)    Diabetes mellitus    Neuropathy    TIA (transient ischemic attack)    Vertigo     Current Outpatient Medications on File Prior to Visit  Medication Sig Dispense Refill   albuterol (VENTOLIN HFA) 108 (90 Base) MCG/ACT inhaler INHALE 2 PUFFS EVERY 6 HOURS AS NEEDED FOR WHEEZING OR SHORTNESS OF BREATH 18 g 1   amLODipine (NORVASC) 5 MG tablet TAKE 1 TABLET BY MOUTH DAILY 100 tablet 2   blood glucose meter kit and supplies Dispense based on patient and insurance preference. Use up to four times daily as directed. (FOR ICD-10 E10.9, E11.9). 1 each 0   buPROPion (WELLBUTRIN SR) 150 MG 12 hr tablet Take  one tablet twice daily 180 tablet 0    calcium-vitamin D (OSCAL WITH D) 500-200 MG-UNIT tablet TAKE (1) TABLET DAILY WITH BREAKFAST. 90 tablet 1   citalopram (CELEXA) 40 MG tablet TAKE ONE TABLET BY MOUTH DAILY AT 9AM (Patient not taking: Reported on 06/14/2022) 90 tablet 1   divalproex (DEPAKOTE ER) 500 MG 24 hr tablet Take 1-2 tablets by mouth 2 (two) times daily. One tablet in the morning and 2 tablets at bedtime     FEROSUL 325 (65 Fe) MG tablet TAKE ONE TABLET BY MOUTH DAILY AT 9AM WITH BREAKFAST 90 tablet 11   fluticasone (FLONASE) 50 MCG/ACT nasal spray Place 2 sprays into both nostrils daily. 16 g 6   glucose blood (ACCU-CHEK GUIDE) test strip CHECK BLOOD SUGAR UP TO FOUR TIMES DAILY AS DIRECTED 100 strip 11   GNP ULTICARE PEN NEEDLES 32G X 4 MM MISC USE TO INJECT INSULIN UP TO 5 TIMES A DAY 200 each 6   HYDROcodone-acetaminophen (NORCO) 10-325 MG tablet Take 1 tablet by mouth 4 (four) times daily as needed.     insulin glargine (LANTUS SOLOSTAR) 100 UNIT/ML Solostar Pen Inject 20 Units into the skin 2 (two) times daily. 15 mL 0   insulin glargine (LANTUS) 100 UNIT/ML injection Inject 0.2 mLs (20 Units total) into the skin 2 (two) times daily. 10 mL 11     insulin lispro (HUMALOG) 100 UNIT/ML KwikPen Inject 20 Units into the skin 2 (two) times daily. (Patient not taking: Reported on 06/14/2022)     Insulin Syringe-Needle U-100 (ADVOCATE INSULIN SYRINGE) 29G X 1/2" 0.3 ML MISC Use to inject insulin 5x daily. 200 each 6   metFORMIN (GLUCOPHAGE) 1000 MG tablet TAKE ONE TABLET BY MOUTH TWICE DAILY @ 9AM & 5PM WITH MEALS 180 tablet 0   metoprolol tartrate (LOPRESSOR) 50 MG tablet TAKE ONE TABLET (50MG) BY MOUTH TWICE DAILY _0 -5PM 180 tablet 3   oxybutynin (DITROPAN) 5 MG tablet TAKE 1 TABLET 2 TO 3 TIMES A DAY FOR BLADDER SPASMS 270 tablet 0   oxyCODONE-acetaminophen (PERCOCET) 7.5-325 MG tablet Take 1 tablet by mouth every 4 (four) hours as needed for severe pain. 30 tablet 0   OZEMPIC, 0.25 OR 0.5 MG/DOSE, 2 MG/3ML SOPN INJECT  SUBCUTANEOUSLY 0.5 MG  EVERY WEEK 9 mL 3   PEG-KCl-NaCl-NaSulf-Na Asc-C (PLENVU) 140 g SOLR Take 140 g by mouth as directed. Manufacturer's coupon Universal coupon code:BIN: P2366821; GROUP: [id number]; PCN: [id number]; ID: [id number]; PAY NO MORE $50 (Patient not taking: Reported on 06/14/2022) 1 each 0   rosuvastatin (CRESTOR) 10 MG tablet Take 1 tablet (10 mg total) by mouth daily. KEEP UPCOMING APPOINTMENT FOR FUTURE REFILLS. 90 tablet 0   SYMBICORT 160-4.5 MCG/ACT inhaler INHALE TWO PUFFS BY MOUTH TWICE DAILY (BULK) 10.2 g 11   XARELTO 20 MG TABS tablet TAKE 1 TABLET BY MOUTH WITH  SUPPER 100 tablet 2   No current facility-administered medications on file prior to visit.    Objective:   Vitals:   06/21/22 1600  BP: 120/83  Pulse: 74    Exam General appearance : Awake, alert, not in any distress.  Aphasia. Not toxic looking HEENT: Atraumatic and Normocephalic, pupils equally reactive to light and accomodation Neck: Supple, no JVD. No cervical lymphadenopathy.  Chest: Good air entry bilaterally, no added sounds  CVS: S1 S2 regular, no murmurs.  Abdomen: Bowel sounds present, Non tender and not distended with no gaurding, rigidity or rebound. Extremities: B/L Lower Ext shows no edema, both legs are warm to touch Neurology: Awake alert, and oriented X 3,  Non focal Skin: No Rash  Data Review Lab Results  Component Value Date   HGBA1C 8.3 (A) 02/25/2022   HGBA1C 7.5 (A) 10/06/2021   HGBA1C 8.9 (H) 05/22/2021    Assessment & Plan  Diagnoses and all orders for this visit:  Type 2 diabetes mellitus with 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, with long-term current use of insulin (HCC) -     POCT glycosylated hemoglobin (Hb A1C) 6.3 previously 8.3- 3 months ago.  Also followed and monitored by clinical pharmacist will make aware of hypoglycemia and consider reduction in medication. - educated on lifestyle modifications, including but not limited to diet choices and adding exercise to daily  routine.    Gait abnormality See HPI  Hypertension due to endocrine disorder BP goal - < 140/90 well controlled Explained that having normal blood pressure is the goal and medications are helping to get to goal and maintain normal blood pressure. DIET: Limit salt intake, read nutrition labels to check salt content, limit fried and high fatty foods  Avoid using multisymptom OTC cold preparations that generally contain sudafed which can rise BP. Consult with pharmacist on best cold relief products to use for persons with HTN EXERCISE Discussed incorporating exercise such as walking - 30 minutes most days of the week and can do in 10 minute intervals  Patient have been counseled extensively about nutrition and exercise. Other issues discussed during this visit include: low cholesterol diet, weight control and daily exercise, foot care, annual eye examinations at Ophthalmology, importance of adherence with medications and regular follow-up. We also discussed long term complications of uncontrolled diabetes and hypertension.   Return in about 3 months (around 09/20/2022) for DM/HTN- fasting labs.  The patient was given clear instructions to go to ER or return to medical center if symptoms don't improve, worsen or new problems develop. The patient verbalized understanding. The patient was told to call to get lab results if they haven't heard anything in the next week.   This note has been created with Dragon speech recognition software and smart phrase technology. Any transcriptional errors are unintentional.    P , NP 06/21/2022, 5:26 PM  

## 2022-06-22 ENCOUNTER — Other Ambulatory Visit: Payer: Medicare Other

## 2022-06-22 ENCOUNTER — Encounter: Payer: Self-pay | Admitting: *Deleted

## 2022-06-23 ENCOUNTER — Telehealth (HOSPITAL_COMMUNITY): Payer: Self-pay | Admitting: *Deleted

## 2022-06-23 NOTE — Telephone Encounter (Signed)
Reaching out to patient to offer assistance regarding upcoming cardiac imaging study; pt verbalizes understanding of appt date/time, but she wishes to cancel due to a death in the family.  She did know when she would like to reschedule to but I let her know that I will have  the scheduler reach back out to her next week.  She verbalized understanding.  Gordy Clement RN Navigator Cardiac Imaging [facility] Heart [facility] Vascular [phone] office [phone] cell

## 2022-06-24 ENCOUNTER — Other Ambulatory Visit (INDEPENDENT_AMBULATORY_CARE_PROVIDER_SITE_OTHER): Payer: Self-pay | Admitting: Primary Care

## 2022-06-24 ENCOUNTER — Ambulatory Visit (HOSPITAL_COMMUNITY): Admission: RE | Admit: 2022-06-24 | Payer: Medicare Other | Source: Ambulatory Visit

## 2022-06-24 DIAGNOSIS — Z760 Encounter for issue of repeat prescription: Secondary | ICD-10-CM

## 2022-06-27 NOTE — Telephone Encounter (Signed)
Pt requesting change of pharmacy to a different mail order pharmacy  Requested Prescriptions  Pending Prescriptions Disp Refills   XARELTO 20 MG TABS tablet [Pharmacy Med Name: Xarelto 20 mg tablet] 90 tablet 2    Sig: TAKE ONE TABLET BY MOUTH DAILY AT 5PM WITH SUPPER     Hematology: Anticoagulants - rivaroxaban Failed - 06/24/2022  7:36 PM      Failed - ALT in normal range and within 360 days    ALT  Date Value Ref Range Status  03/10/2021 34 (H) 0 - 32 IU/L Final         Failed - AST in normal range and within 360 days    AST  Date Value Ref Range Status  03/10/2021 20 0 - 40 IU/L Final         Failed - Cr in normal range and within 360 days    Creatinine, Ser  Date Value Ref Range Status  06/20/2022 1.02 (H) 0.57 - 1.00 mg/dL Final   Creatinine, Urine  Date Value Ref Range Status  12/17/2019 128.15 mg/dL Final    Comment:    Performed at [facility], [address], [place], [place] [address]         [place] in normal range and within 360 days    Hematocrit  Date Value Ref Range Status  06/20/2022 39.2 34.0 - 46.6 % Final         Passed - HGB in normal range and within 360 days    Hemoglobin  Date Value Ref Range Status  06/20/2022 12.8 11.1 - 15.9 g/dL Final         Passed - PLT in normal range and within 360 days    Platelets  Date Value Ref Range Status  06/20/2022 259 150 - 450 x10E3/uL Final         Passed - eGFR is 15 or above and within 360 days    GFR calc Af Amer  Date Value Ref Range Status  03/17/2020 >60 >60 mL/min Final   GFR, Estimated  Date Value Ref Range Status  05/24/2021 >60 >60 mL/min Final    Comment:    (NOTE) Calculated using the CKD-EPI Creatinine Equation (2021)    eGFR  Date Value Ref Range Status  06/20/2022 61 >59 mL/min/1.73 Final         Passed - Patient is not pregnant      Passed - Valid encounter within last 12 months    Recent Outpatient Visits           6 days ago Type 2 diabetes mellitus with  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, with long-term current use of insulin ([place])   [place], Michelle P, NP   1 month ago Encounter for [employer] annual wellness exam   [place] [name] P, NP   2 months ago Type 2 diabetes mellitus with 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, with long-term current use of insulin ([place])   [place] [name] P, NP   4 months ago Type 2 diabetes mellitus with 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, with long-term current use of insulin ([place])   [place] [facility] [name] P, NP   8 months ago Type 2 diabetes mellitus with 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, with long-term current use of insulin ([place])   [facility] [name], NP       Future Appointments  In 1 week Daisy Blossom, Jarome Matin, [place]

## 2022-07-04 ENCOUNTER — Other Ambulatory Visit (INDEPENDENT_AMBULATORY_CARE_PROVIDER_SITE_OTHER): Payer: Self-pay | Admitting: Primary Care

## 2022-07-04 DIAGNOSIS — N3942 Incontinence without sensory awareness: Secondary | ICD-10-CM

## 2022-07-04 DIAGNOSIS — Z760 Encounter for issue of repeat prescription: Secondary | ICD-10-CM

## 2022-07-06 ENCOUNTER — Other Ambulatory Visit: Payer: Medicaid Other

## 2022-07-07 NOTE — Telephone Encounter (Signed)
Copied from [place] [id number]. Topic: Appointment Scheduling - Scheduling Inquiry for Clinic >> Jul 06, 2022  9:01 AM Rhonda Hickman wrote: Reason for CRM: Pt stated she needs to reschedule her upcoming appointment with [facility]. Pt is requesting a call back to reschedule and asked that she not be rescheduled until we speak with her directly.  Please advise.

## 2022-07-08 ENCOUNTER — Telehealth (HOSPITAL_COMMUNITY): Payer: Self-pay | Admitting: Emergency Medicine

## 2022-07-08 ENCOUNTER — Ambulatory Visit: Payer: Medicare Other | Admitting: Pharmacist

## 2022-07-08 NOTE — Telephone Encounter (Signed)
Patient states she was not aware of this new appt on 07/14/2022.   Was rescheduled for 08/09/2022 3p

## 2022-07-08 NOTE — Telephone Encounter (Signed)
Reaching out to patient to offer assistance regarding upcoming cardiac imaging study; pt verbalizes understanding of appt date/time, parking situation and where to check in, pre-test NPO status and medications ordered, and verified current allergies; name and call back number provided for further questions should they arise Suheyla Mortellaro RN Navigator Cardiac Imaging  Heart and Vascular [phone] office [phone] cell 

## 2022-07-12 ENCOUNTER — Other Ambulatory Visit (HOSPITAL_COMMUNITY): Payer: Medicaid Other

## 2022-07-13 ENCOUNTER — Ambulatory Visit (HOSPITAL_COMMUNITY): Admission: RE | Admit: 2022-07-13 | Payer: 59 | Source: Ambulatory Visit

## 2022-07-14 ENCOUNTER — Other Ambulatory Visit: Payer: Self-pay | Admitting: Cardiology

## 2022-07-14 ENCOUNTER — Ambulatory Visit: Payer: Medicare Other | Admitting: Pharmacist

## 2022-07-15 ENCOUNTER — Telehealth: Payer: Self-pay | Admitting: Primary Care

## 2022-07-15 ENCOUNTER — Ambulatory Visit (HOSPITAL_COMMUNITY): Payer: Medicaid Other

## 2022-07-15 NOTE — Telephone Encounter (Signed)
Copied from [place] [id number]. Topic: General - Other >> Jul 15, 2022  8:41 AM [place] B wrote: Reason for CRM: Elita Quick from [facility] called in asking if request was received for freestyle [place] sent on 12/29.

## 2022-07-15 NOTE — Telephone Encounter (Addendum)
PCP out of office. Unable to complete paperwork at this time. Will complete upon return.

## 2022-07-19 ENCOUNTER — Telehealth (HOSPITAL_COMMUNITY): Payer: Self-pay | Admitting: *Deleted

## 2022-07-19 NOTE — Telephone Encounter (Signed)
Reaching out to patient to offer assistance regarding upcoming cardiac imaging study; pt verbalizes understanding of appt date/time, parking situation and where to check in, pre-test NPO status and medications ordered, and verified current allergies; name and call back number provided for further questions should they arise  Rhonda Clement RN Navigator Cardiac Imaging [facility] [phone] office [phone] cell  Patient to hold her amlodipine and take '100mg'$  metoprolol tartrate two hours prior to her cardiac CT scan. She is aware to arrive at 2pm.

## 2022-07-19 NOTE — Telephone Encounter (Signed)
Form has been received and will be faxed back once provider signs paperwork

## 2022-07-20 ENCOUNTER — Other Ambulatory Visit (INDEPENDENT_AMBULATORY_CARE_PROVIDER_SITE_OTHER): Payer: Self-pay

## 2022-07-20 ENCOUNTER — Ambulatory Visit (HOSPITAL_COMMUNITY): Admission: RE | Admit: 2022-07-20 | Payer: 59 | Source: Ambulatory Visit

## 2022-07-20 DIAGNOSIS — E1100 Type 2 diabetes mellitus with hyperosmolarity without nonketotic hyperglycemic-hyperosmolar coma (NKHHC): Secondary | ICD-10-CM

## 2022-07-20 MED ORDER — INSULIN GLARGINE 100 UNIT/ML ~~LOC~~ SOLN: 20.0000 [IU] | Freq: Two times a day (BID) | SUBCUTANEOUS | 3 refills | Status: DC

## 2022-07-24 IMAGING — CT CT HEAD W/O CM
3 series · 15 of 47 positions shown, 18 images · non-contrast
Comparison: September 09, 2019

CLINICAL DATA: Acute pain due to trauma.  Syncope.

EXAM:
CT HEAD WITHOUT CONTRAST
TECHNIQUE: Contiguous axial images were obtained from the base of the skull
through the vertex without intravenous contrast.

[Series 2: head w o · axial · 0.42mm/px · z∈[+1500,+1625]mm · 9 of 31 slices shown, 12 images]
[im 3/31  brain]
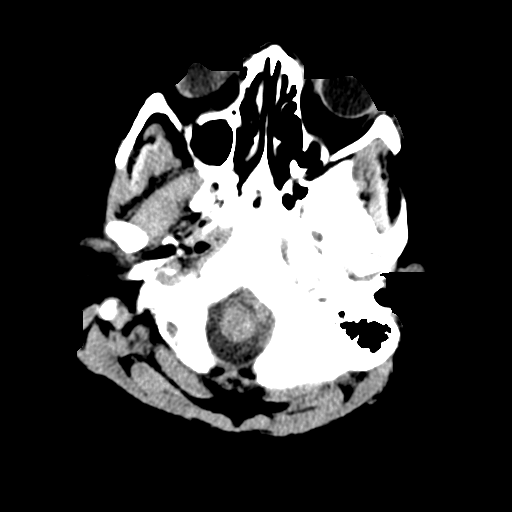
[im 3/31  bone]
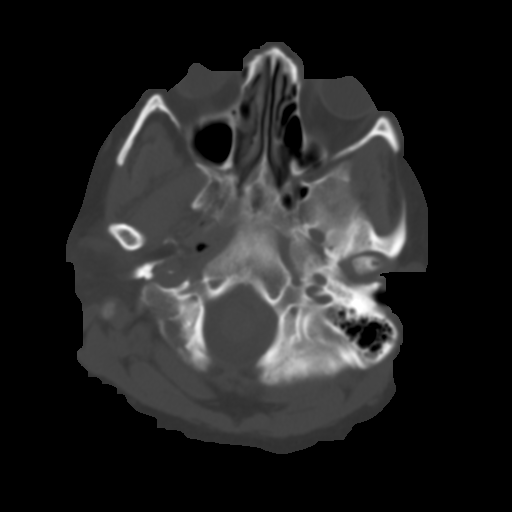
[im 6/31  brain]
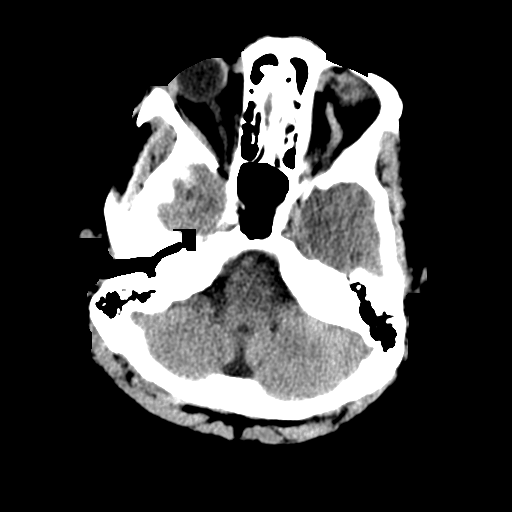
[im 9/31  brain]
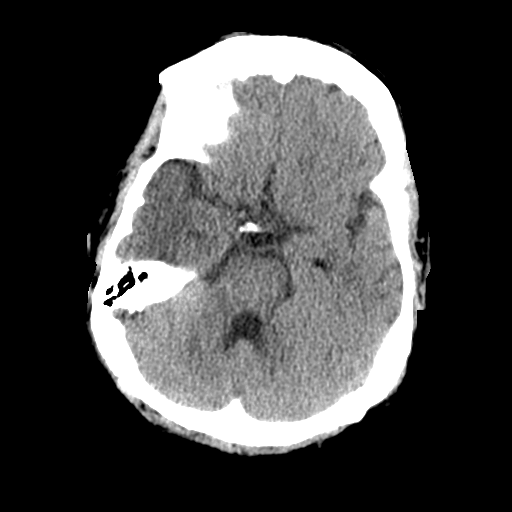
[im 12/31  brain]
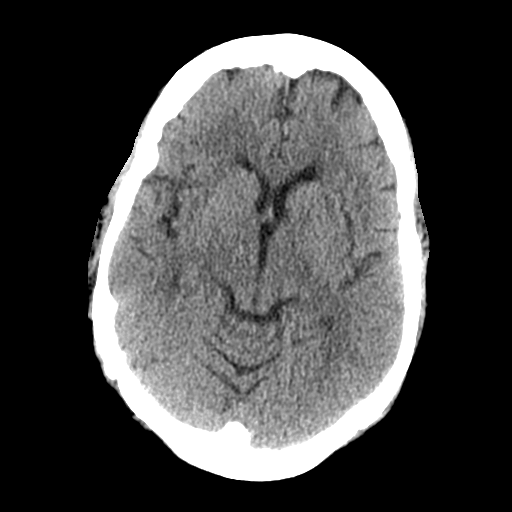
[im 16/31  brain]
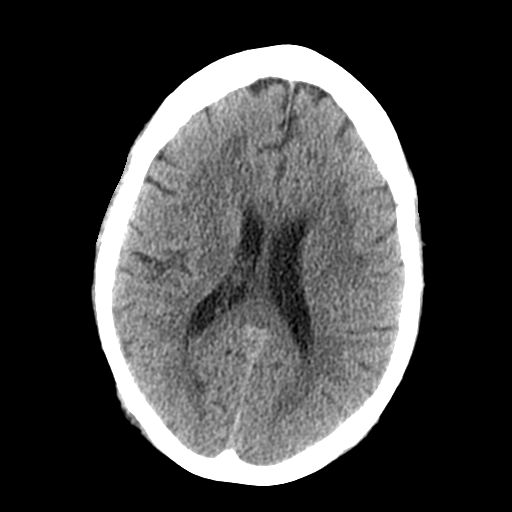
[im 16/31  bone]
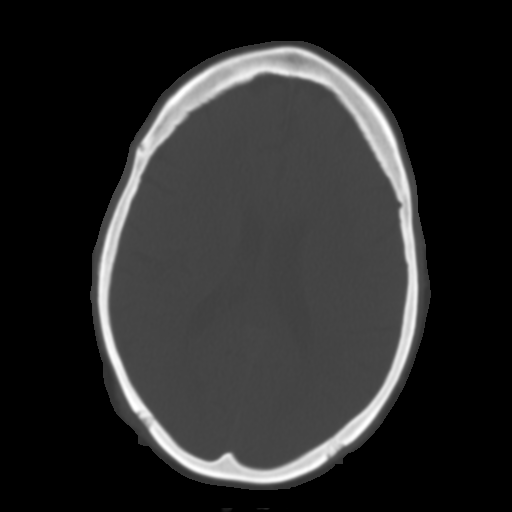
[im 19/31  brain]
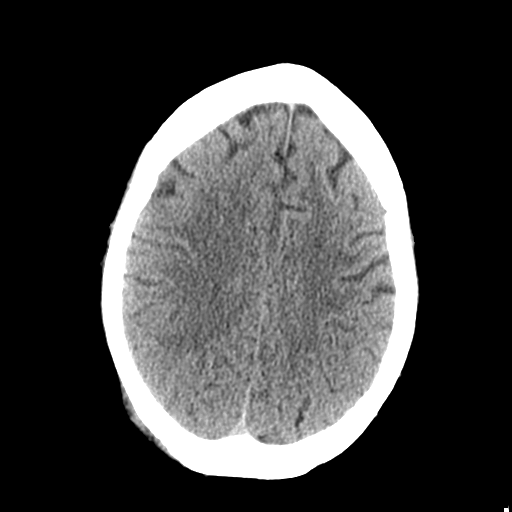
[im 22/31  brain]
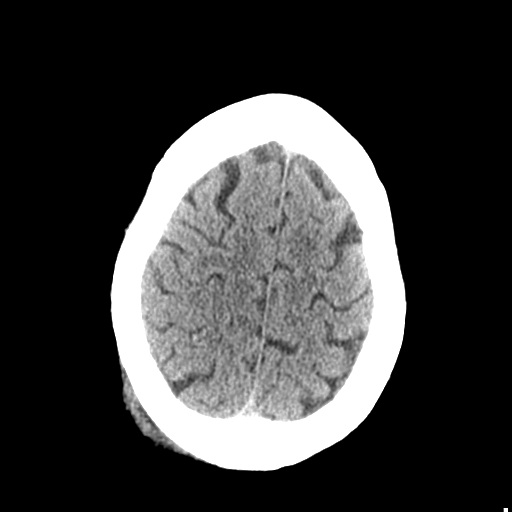
[im 25/31  brain]
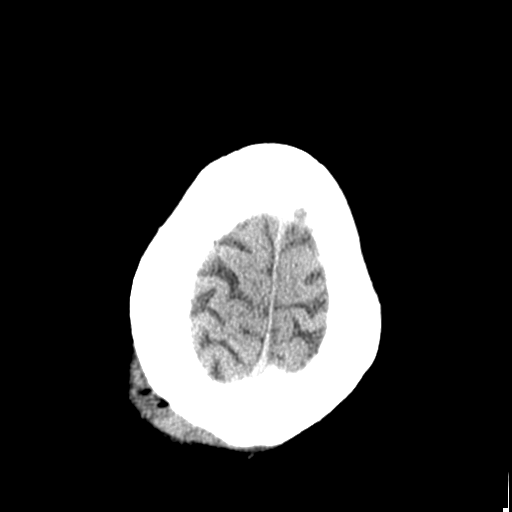
[im 28/31  brain]
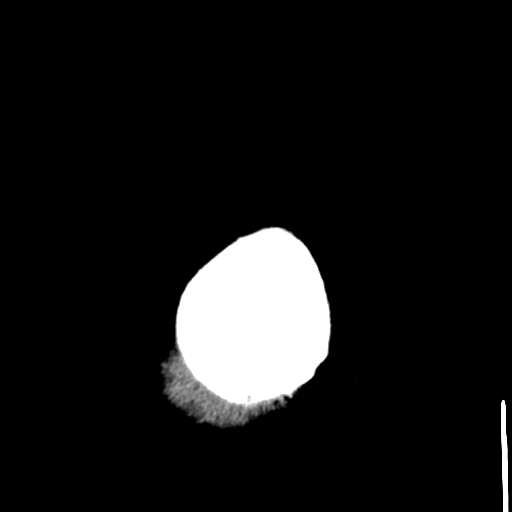
[im 28/31  bone]
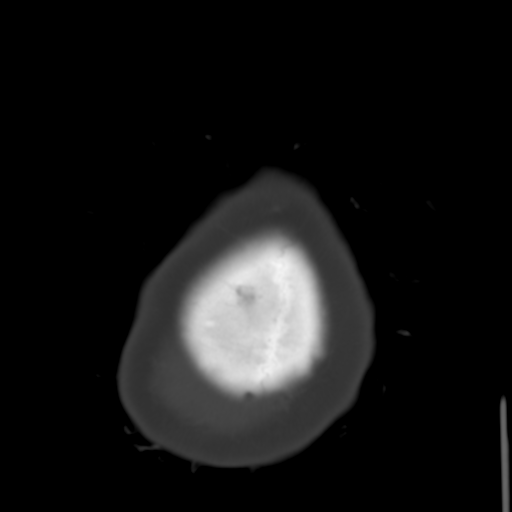

[Series 4: coronal soft · coronal · 0.31mm/px · 3 of 69 slices shown]
[im 23/69  brain]
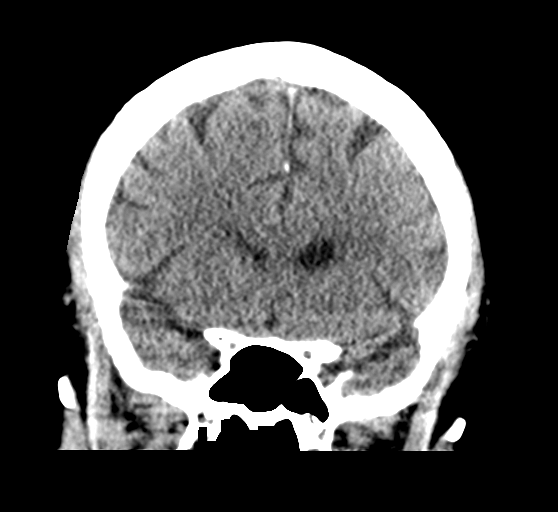
[im 31/69  brain]
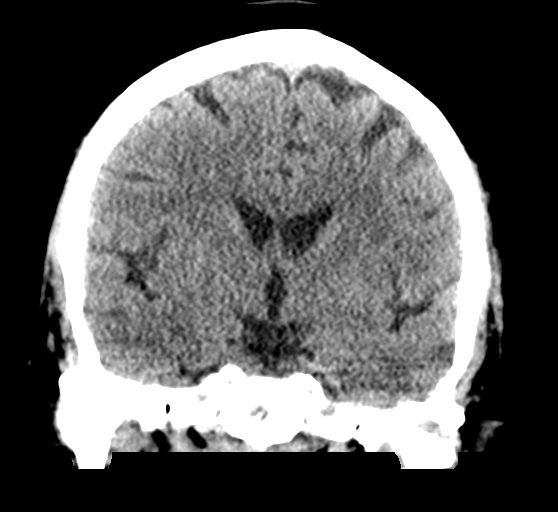
[im 38/69  brain]
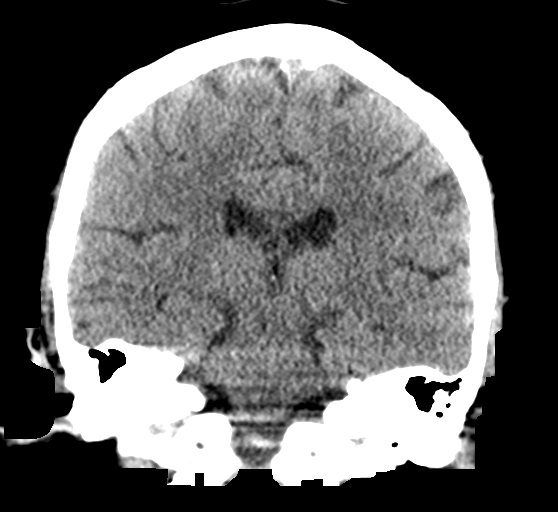

[Series 5: sagittal soft · sagittal · 0.34mm/px · 3 of 60 slices shown]
[im 20/60  brain]
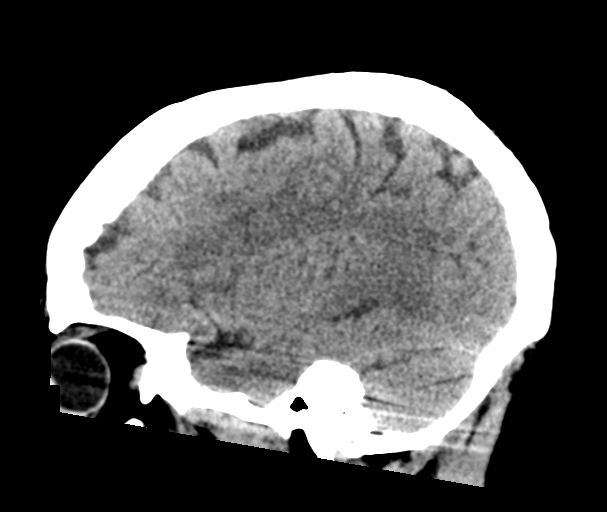
[im 30/60  brain]
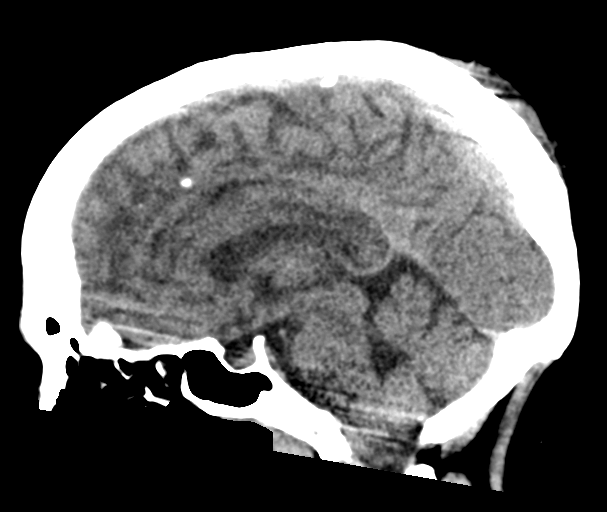
[im 40/60  brain]
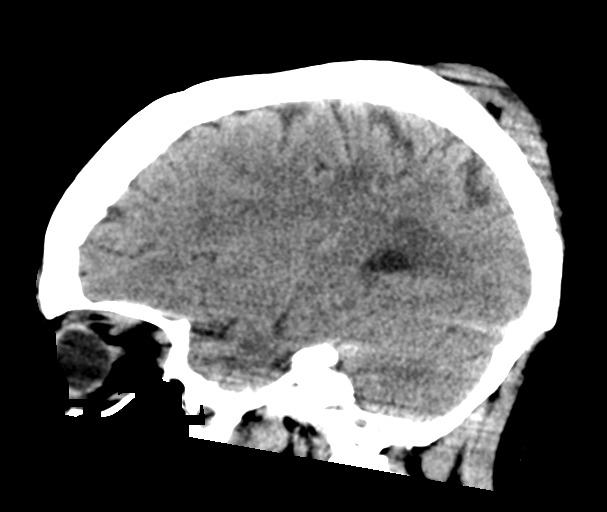

[15 of 47 positions shown; findings below may reference images not displayed]

FINDINGS: Brain: No hemorrhage. No extraaxial collection.No midline shift. The
ventricular system is unremarkable.The basal cisterns are
unremarkable. Patchy and confluent areas of decreased attenuation
are noted throughout the deep and periventricular white matter of
the cerebral hemispheres bilaterally, compatible with chronic
microvascular ischemic disease. the brainstem is unremarkable. The
cerebellum is unremarkable. The sella is unremarkable.

Vascular: No hyperdense vessel or unexpected calcification.

Skull: There is right posterior scalp swelling without evidence for
an underlying calvarial fracture. The skull base is unremarkable.
The visualized upper cervical spine is unremarkable.

Sinuses/Orbits: The visualized orbits are unremarkable. The
paranasal sinuses are unremarkable. The mastoid air cells are clear.

Other: The visualized parotid gland is unremarkable. There is no
scalp soft tissue swelling.
IMPRESSION: 1. No acute intracranial abnormality.
2. Right posterior scalp swelling without evidence for an underlying
calvarial fracture.
3. Chronic microvascular ischemic disease.

## 2022-07-25 ENCOUNTER — Telehealth (HOSPITAL_COMMUNITY): Payer: Self-pay | Admitting: Emergency Medicine

## 2022-07-25 NOTE — Telephone Encounter (Signed)
Attempted to call patient regarding upcoming cardiac CT appointment. Left message on voicemail with name and callback number Marchia Bond RN Navigator Cardiac Section Heart and Vascular Services [phone] Office [phone] Cell

## 2022-07-26 ENCOUNTER — Telehealth (HOSPITAL_COMMUNITY): Payer: Self-pay | Admitting: *Deleted

## 2022-07-26 NOTE — Telephone Encounter (Signed)
Reaching out to Rhonda Hickman to offer assistance regarding upcoming cardiac imaging study; pt verbalizes understanding of appt date/time, parking situation and where to check in, pre-test NPO status and medications ordered, and verified current allergies; name and call back number provided for further questions should they arise  Rhonda Clement RN Navigator Cardiac Imaging Rhonda Hickman Heart and Vascular [phone] office [phone] cell  Rhonda Hickman to take '100mg'$  metoprolol tartrate two hours prior to her cardiac CT scan.  She will be taking transportation through her insurance company and is aware to arrive at 2pm.

## 2022-07-27 ENCOUNTER — Ambulatory Visit (HOSPITAL_COMMUNITY)
Admission: RE | Admit: 2022-07-27 | Discharge: 2022-07-27 | Disposition: A | Discharge status: Home / Self Care | Payer: 59 | Source: Ambulatory Visit | Attending: Cardiology | Admitting: Cardiology

## 2022-07-27 ENCOUNTER — Encounter (HOSPITAL_COMMUNITY): Payer: Self-pay

## 2022-07-27 DIAGNOSIS — R079 Chest pain, unspecified: Secondary | ICD-10-CM

## 2022-07-27 MED ORDER — DILTIAZEM HCL 25 MG/5ML IV SOLN
5.0000 mg | INTRAVENOUS | Status: DC | PRN
  Administered 2022-07-27: 5 mg via INTRAVENOUS

## 2022-07-27 MED ORDER — DILTIAZEM HCL 25 MG/5ML IV SOLN
INTRAVENOUS | Status: AC
  Filled 2022-07-27: qty 5

## 2022-07-27 MED ORDER — METOPROLOL TARTRATE 5 MG/5ML IV SOLN
INTRAVENOUS | Status: AC
  Administered 2022-07-27: 5 mg via INTRAVENOUS
  Filled 2022-07-27: qty 20

## 2022-07-27 MED ORDER — METOPROLOL TARTRATE 5 MG/5ML IV SOLN
5.0000 mg | INTRAVENOUS | Status: DC | PRN
  Administered 2022-07-27: 5 mg via INTRAVENOUS

## 2022-07-27 MED ORDER — NITROGLYCERIN 0.4 MG SL SUBL: 0.8000 mg | SUBLINGUAL_TABLET | Freq: Once | SUBLINGUAL | Status: DC

## 2022-07-27 NOTE — Progress Notes (Signed)
Patient forgot to take her Metoprolol '100mg'$  at 1230. Initial heart rate 90's and SBP 127. Treated with Metoprolol 10 mg IV and Diltiazem '5mg'$  with BP drop to 100/68 and heart rate mid 80's. Merle RN notified and test cancelled. She will reach out to patient.

## 2022-07-29 ENCOUNTER — Ambulatory Visit (INDEPENDENT_AMBULATORY_CARE_PROVIDER_SITE_OTHER): Payer: Self-pay

## 2022-07-29 NOTE — Telephone Encounter (Signed)
FYI

## 2022-07-29 NOTE — Telephone Encounter (Signed)
  Chief Complaint: Arm Pain Symptoms: Pain - cannot lift arm properly Frequency: 2 months Pertinent Negatives: Patient denies fever, redness Disposition: '[]'$ ED /'[]'$ Urgent Care (no appt availability in office) / '[x]'$ Appointment(In office/virtual)/ '[]'$  [place] [facility]/ '[]'$ Home Care/ '[]'$ Refused Recommended Disposition /'[]'$ [place] [facility]/ '[]'$  Follow-up with PCP Additional Notes: PT hurt her arm about 2 months ago picking up her wheel chair. PPT states that she cannot pick up her arm properly. Protocol has pt seeing provider within 3 days. No appts. Available. Made appt for 1st available and placed on waitlist.    Reason for Disposition  [1] MODERATE pain (e.g., interferes with normal activities) AND [2] present > 3 days  Answer Assessment - Initial Assessment Questions 1. ONSET: "When did the pain start?"     2 months 2. LOCATION: "Where is the pain located?"     Left arm  -  knot on left shoulder 3. PAIN: "How bad is the pain?" (Scale 1-10; or mild, moderate, severe)   - MILD (1-3): Doesn't interfere with normal activities.   - MODERATE (4-7): Interferes with normal activities (e.g., work or school) or awakens from sleep.   - SEVERE (8-10): Excruciating pain, unable to do any normal activities, unable to hold a cup of water.     9/10 4. WORK OR EXERCISE: "Has there been any recent work or exercise that involved this part of the body?"     yes 5. CAUSE: "What do you think is causing the arm pain?"     Lifting wheel chair 6. OTHER SYMPTOMS: "Do you have any other symptoms?" (e.g., neck pain, swelling, rash, fever, numbness, weakness)     Can't lift arm normally 7. PREGNANCY: "Is there any chance you are pregnant?" "When was your last menstrual period?"  Protocols used: Arm Pain-A-AH

## 2022-08-03 ENCOUNTER — Ambulatory Visit (HOSPITAL_COMMUNITY): Payer: 59

## 2022-08-05 ENCOUNTER — Telehealth (HOSPITAL_COMMUNITY): Payer: Self-pay | Admitting: Emergency Medicine

## 2022-08-05 NOTE — Telephone Encounter (Signed)
Reaching out to patient to offer assistance regarding upcoming cardiac imaging study; pt verbalizes understanding of appt date/time, parking situation and where to check in, pre-test NPO status and medications ordered, and verified current allergies; name and call back number provided for further questions should they arise Marchia Bond RN Navigator Cardiac Imaging [facility] [phone] office [phone] cell  Arrival 200 WC entrnace Denies iv issues  Holding amlodipine '100mg'$  metoprolol 1230

## 2022-08-08 ENCOUNTER — Ambulatory Visit (HOSPITAL_COMMUNITY)
Admission: RE | Admit: 2022-08-08 | Discharge: 2022-08-08 | Disposition: A | Discharge status: Home / Self Care | Payer: 59 | Source: Ambulatory Visit | Attending: Cardiology | Admitting: Cardiology

## 2022-08-08 ENCOUNTER — Encounter (HOSPITAL_COMMUNITY): Payer: Self-pay

## 2022-08-08 DIAGNOSIS — R079 Chest pain, unspecified: Secondary | ICD-10-CM | POA: Insufficient documentation

## 2022-08-08 MED ORDER — METOPROLOL TARTRATE 5 MG/5ML IV SOLN: 5.0000 mg | Freq: Once | INTRAVENOUS | Status: AC

## 2022-08-08 MED ORDER — METOPROLOL TARTRATE 5 MG/5ML IV SOLN
INTRAVENOUS | Status: AC
  Administered 2022-08-08: 10 mg via INTRAVENOUS
  Filled 2022-08-08: qty 20

## 2022-08-08 MED ORDER — METOPROLOL TARTRATE 5 MG/5ML IV SOLN
10.0000 mg | Freq: Once | INTRAVENOUS | Status: AC
  Administered 2022-08-08: 10 mg via INTRAVENOUS

## 2022-08-08 MED ORDER — DILTIAZEM HCL 25 MG/5ML IV SOLN
INTRAVENOUS | Status: AC
  Filled 2022-08-08: qty 5

## 2022-08-08 MED ORDER — NITROGLYCERIN 0.4 MG SL SUBL: 0.8000 mg | SUBLINGUAL_TABLET | Freq: Once | SUBLINGUAL | Status: DC

## 2022-08-09 ENCOUNTER — Ambulatory Visit: Payer: Medicaid Other | Admitting: Pharmacist

## 2022-08-10 ENCOUNTER — Telehealth (INDEPENDENT_AMBULATORY_CARE_PROVIDER_SITE_OTHER): Payer: Self-pay

## 2022-08-10 NOTE — Telephone Encounter (Signed)
Copied from [place] [id number]. Topic: Appointment Scheduling - Scheduling Inquiry for Clinic >> Aug 09, 2022 11:53 AM Erskine Squibb wrote: Reason for CRM: The patient states she had surgery last week on her eyes and she is struggling a little today. She states she needs to reschedule her appt that is scheduled today with Lurena Joiner. Please assist patient further

## 2022-08-12 ENCOUNTER — Other Ambulatory Visit (INDEPENDENT_AMBULATORY_CARE_PROVIDER_SITE_OTHER): Payer: Self-pay | Admitting: Primary Care

## 2022-08-12 DIAGNOSIS — Z720 Tobacco use: Secondary | ICD-10-CM

## 2022-08-12 NOTE — Telephone Encounter (Signed)
Will forward to provider  

## 2022-08-12 NOTE — Telephone Encounter (Signed)
Requested medications are due for refill today.  yes  Requested medications are on the active medications list.  yes  Last refill. 05/26/2022 #180 0 rf  Future visit scheduled.   yes  Notes to clinic.  Expired labs    Requested Prescriptions  Pending Prescriptions Disp Refills   buPROPion (WELLBUTRIN SR) 150 MG 12 hr tablet [Pharmacy Med Name: bupropion HCl SR 150 mg tablet,12 hr sustained-release] 180 tablet 11    Sig: TAKE ONE TABLET BY MOUTH TWICE DAILY @ 9AM & 9PM     Psychiatry: Antidepressants - bupropion Failed - 08/12/2022  1:23 AM      Failed - Cr in normal range and within 360 days    Creatinine, Ser  Date Value Ref Range Status  06/20/2022 1.02 (H) 0.57 - 1.00 mg/dL Final   Creatinine, Urine  Date Value Ref Range Status  12/17/2019 128.15 mg/dL Final    Comment:    Performed at [facility], [address] [place], [place] [address]         Failed - AST in normal range and within 360 days    AST  Date Value Ref Range Status  03/10/2021 20 0 - 40 IU/L Final         Failed - ALT in normal range and within 360 days    ALT  Date Value Ref Range Status  03/10/2021 34 (H) 0 - 32 IU/L Final         Failed - Last BP in normal range    BP Readings from Last 1 Encounters:  08/08/22 (!) 129/94         Passed - Valid encounter within last 6 months    Recent Outpatient Visits           1 month ago Type 2 diabetes mellitus with 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, with long-term current use of insulin ([place])   [facility] [name], NP   2 months ago Encounter for [employer] annual wellness exam   [facility] [name], NP   4 months ago Type 2 diabetes mellitus with 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, with long-term current use of insulin ([place])   [place], Michelle P, NP   5 months ago Type 2 diabetes mellitus with 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, with long-term  current use of insulin ([place])   [facility] [name], NP   10 months ago Type 2 diabetes mellitus with 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, with long-term current use of insulin [facility])   [place] [facility] [name], NP       Future Appointments             In 4 days Oletta Lamas, Milford Cage, NP [place]

## 2022-08-15 ENCOUNTER — Ambulatory Visit: Payer: 59 | Admitting: Gastroenterology

## 2022-08-16 ENCOUNTER — Encounter (INDEPENDENT_AMBULATORY_CARE_PROVIDER_SITE_OTHER): Payer: Self-pay | Admitting: Primary Care

## 2022-08-16 ENCOUNTER — Ambulatory Visit (INDEPENDENT_AMBULATORY_CARE_PROVIDER_SITE_OTHER): Payer: 59 | Admitting: Primary Care

## 2022-08-16 VITALS — BP 109/71 | HR 86 | Resp 16

## 2022-08-16 DIAGNOSIS — M25512 Pain in left shoulder: Secondary | ICD-10-CM

## 2022-08-16 DIAGNOSIS — M25612 Stiffness of left shoulder, not elsewhere classified: Secondary | ICD-10-CM | POA: Diagnosis not present

## 2022-08-16 DIAGNOSIS — R2681 Unsteadiness on feet: Secondary | ICD-10-CM

## 2022-08-16 NOTE — Progress Notes (Signed)
[place]  Rhonda Hickman, is a 67 y.o. female  BJS:[id number]  YWV:[id number]  DOB - 07-02-1956  Chief Complaint  Patient presents with   Arm Pain    Left        Subjective:   Rhonda Hickman is a 67 y.o. female here today for an acute visit visit with complaints of L shoulder pain after lifting her wheelchair . Denies prior shoulder problems and reports multiple falls at home. Patient has No headache, No chest pain, No abdominal pain - No Nausea, No new weakness tingling or numbness, No Cough - shortness of breath  No problems updated.  Allergies  Allergen Reactions   Aspirin Nausea Only and Other (See Comments)    Causes stomach pain    Past Medical History:  Diagnosis Date   Anemia    Anxiety    Asthma    Atrial fibrillation (HCC)    Bipolar 1 disorder (HCC)    Bulging lumbar disc    Chronic pain of left knee    COPD (chronic obstructive pulmonary disease) (HCC)    CVA (cerebral vascular accident) ([place])    Diabetes mellitus    Neuropathy    TIA (transient ischemic attack)    Vertigo     Current Outpatient Medications on File Prior to Visit  Medication Sig Dispense Refill   albuterol (VENTOLIN HFA) 108 (90 Base) MCG/ACT inhaler INHALE 2 PUFFS EVERY 6 HOURS AS NEEDED FOR WHEEZING OR SHORTNESS OF BREATH 18 g 1   amLODipine (NORVASC) 5 MG tablet TAKE 1 TABLET BY MOUTH DAILY 100 tablet 2   blood glucose meter kit and supplies Dispense based on patient and insurance preference. Use up to four times daily as directed. (FOR ICD-10 E10.9, E11.9). 1 each 0   buPROPion (WELLBUTRIN SR) 150 MG 12 hr tablet TAKE ONE TABLET BY MOUTH TWICE DAILY @ 9AM & 9PM 180 tablet 11   calcium-vitamin D (OSCAL WITH D) 500-200 MG-UNIT tablet TAKE (1) TABLET DAILY WITH BREAKFAST. 90 tablet 1   citalopram (CELEXA) 40 MG tablet TAKE ONE TABLET BY MOUTH DAILY AT 9AM (Patient not taking: Reported on 06/14/2022) 90 tablet 1   divalproex (DEPAKOTE ER) 500 MG 24 hr tablet Take 1-2  tablets by mouth 2 (two) times daily. One tablet in the morning and 2 tablets at bedtime     FEROSUL 325 (65 Fe) MG tablet TAKE ONE TABLET BY MOUTH DAILY AT 9AM WITH BREAKFAST 90 tablet 11   fluticasone (FLONASE) 50 MCG/ACT nasal spray Place 2 sprays into both nostrils daily. 16 g 6   glucose blood (ACCU-CHEK GUIDE) test strip CHECK BLOOD SUGAR UP TO FOUR TIMES DAILY AS DIRECTED 100 strip 11   GNP ULTICARE PEN NEEDLES 32G X 4 MM MISC USE TO INJECT INSULIN UP TO 5 TIMES A DAY 200 each 6   HYDROcodone-acetaminophen (NORCO) 10-325 MG tablet Take 1 tablet by mouth 4 (four) times daily as needed.     insulin glargine (LANTUS SOLOSTAR) 100 UNIT/ML Solostar Pen Inject 20 Units into the skin 2 (two) times daily. 15 mL 0   insulin glargine (LANTUS) 100 UNIT/ML injection Inject 0.2 mLs (20 Units total) into the skin 2 (two) times daily. 10 mL 3   insulin lispro (HUMALOG) 100 UNIT/ML KwikPen Inject 20 Units into the skin 2 (two) times daily. (Patient not taking: Reported on 06/14/2022)     Insulin Syringe-Needle U-100 (ADVOCATE INSULIN SYRINGE) 29G X 1/2" 0.3 ML MISC Use to inject insulin 5x daily. 200 each  6   metFORMIN (GLUCOPHAGE) 1000 MG tablet TAKE ONE TABLET BY MOUTH TWICE DAILY @ 9AM & 5PM WITH MEALS 180 tablet 0   metoprolol tartrate (LOPRESSOR) 50 MG tablet TAKE ONE TABLET ('50MG'$ ) BY MOUTH TWICE DAILY '@9AM'$ -5PM 180 tablet 3   oxybutynin (DITROPAN) 5 MG tablet TAKE 1 TABLET BY MOUTH 2 TO 3  TIMES DAILY FOR BLADDER SPASMS 270 tablet 3   oxyCODONE-acetaminophen (PERCOCET) 7.5-325 MG tablet Take 1 tablet by mouth every 4 (four) hours as needed for severe pain. 30 tablet 0   OZEMPIC, 0.25 OR 0.5 MG/DOSE, 2 MG/3ML SOPN INJECT SUBCUTANEOUSLY 0.5 MG  EVERY WEEK 9 mL 3   PEG-KCl-NaCl-NaSulf-Na Asc-C (PLENVU) 140 g SOLR Take 140 g by mouth as directed. Manufacturer's coupon Universal coupon code:BIN: P2366821; GROUP: [id number]; PCN: [id number]; ID: [id number]; PAY NO MORE $50 (Patient not taking: Reported on 06/14/2022) 1  each 0   rivaroxaban (XARELTO) 20 MG TABS tablet TAKE ONE TABLET BY MOUTH DAILY AT 5PM WITH SUPPER 90 tablet 2   rosuvastatin (CRESTOR) 10 MG tablet Take 1 tablet (10 mg total) by mouth daily. 60 tablet 2   SYMBICORT 160-4.5 MCG/ACT inhaler INHALE TWO PUFFS BY MOUTH TWICE DAILY (BULK) 10.2 g 11   No current facility-administered medications on file prior to visit.   Comprehensive ROS Pertinent positive and negative noted in HPI    Objective:   Vitals:   08/16/22 1547  BP: 109/71  Pulse: 86  Resp: 16  SpO2: 97%    Exam General appearance : Awake, alert, not in any distress. Speech Clear. Not toxic looking HEENT: Atraumatic and Normocephalic, pupils equally reactive to light and accomodation Neck: Supple, no JVD. No cervical lymphadenopathy.  Chest: Good air entry bilaterally, no added sounds  CVS: S1 S2 regular, no murmurs.  Abdomen: Bowel sounds present, Non tender and not distended with no gaurding, rigidity or rebound. Extremities: can only lift L arm up to 45 degree angle, B/L Lower Ext shows no edema, both legs are warm to touch Neurology: Awake alert, and oriented X 3, CN II-XII intact, Non focal Skin: No Rash  Data Review Lab Results  Component Value Date   HGBA1C 8.3 (A) 02/25/2022   HGBA1C 7.5 (A) 10/06/2021   HGBA1C 8.9 (H) 05/22/2021    Assessment & Plan   [name] was seen today for arm pain.  Diagnoses and all orders for this visit:  Acute pain of left shoulder -     DME Wheelchair electric -     Ambulatory referral to Physical Therapy -     DG Shoulder Left; Future  Decreased ROM of left shoulder -     DME Wheelchair electric -     Ambulatory referral to Physical Therapy -     DG Shoulder Left; Future    Patient is requesting Rx for an electric wheelchair due to poor ROM and multiple falls at home.   Patient have been counseled extensively about nutrition and exercise. Other issues discussed during this visit include: low cholesterol diet, weight  control and daily exercise, foot care, annual eye examinations at Ophthalmology, importance of adherence with medications and regular follow-up. We also discussed long term complications of uncontrolled diabetes and hypertension.   Return in about 6 weeks (around 09/27/2022) for DM.  The patient was given clear instructions to go to ER or return to medical center if symptoms don't improve, worsen or new problems develop. The patient verbalized understanding. The patient was told to call to get lab results  if they haven't heard anything in the next week.   This note has been created with Surveyor, quantity. Any transcriptional errors are unintentional.   [name], NP 08/16/2022, 4:06 PM

## 2022-08-22 ENCOUNTER — Other Ambulatory Visit: Payer: Self-pay | Admitting: Primary Care

## 2022-08-22 DIAGNOSIS — Z1231 Encounter for screening mammogram for malignant neoplasm of breast: Secondary | ICD-10-CM

## 2022-08-25 ENCOUNTER — Ambulatory Visit: Payer: 59 | Admitting: Physical Therapy

## 2022-08-26 ENCOUNTER — Ambulatory Visit (HOSPITAL_COMMUNITY): Payer: 59 | Attending: Cardiology

## 2022-08-26 DIAGNOSIS — R079 Chest pain, unspecified: Secondary | ICD-10-CM

## 2022-08-26 LAB — ECHOCARDIOGRAM COMPLETE
Area-P 1/2: 3.46 cm2
S' Lateral: 3 cm

## 2022-09-01 ENCOUNTER — Ambulatory Visit: Payer: 59 | Admitting: Physical Therapy

## 2022-09-01 ENCOUNTER — Other Ambulatory Visit (HOSPITAL_COMMUNITY): Payer: Self-pay | Admitting: Emergency Medicine

## 2022-09-01 DIAGNOSIS — R079 Chest pain, unspecified: Secondary | ICD-10-CM

## 2022-09-02 ENCOUNTER — Other Ambulatory Visit (INDEPENDENT_AMBULATORY_CARE_PROVIDER_SITE_OTHER): Payer: Self-pay

## 2022-09-07 ENCOUNTER — Other Ambulatory Visit: Payer: Self-pay

## 2022-09-07 ENCOUNTER — Other Ambulatory Visit (HOSPITAL_COMMUNITY): Payer: Self-pay

## 2022-09-07 ENCOUNTER — Telehealth (HOSPITAL_COMMUNITY): Payer: Self-pay | Admitting: *Deleted

## 2022-09-07 ENCOUNTER — Other Ambulatory Visit (HOSPITAL_COMMUNITY): Payer: Self-pay | Admitting: *Deleted

## 2022-09-07 MED ORDER — IVABRADINE HCL 5 MG PO TABS
ORAL_TABLET | ORAL | 0 refills | Status: DC
  Filled 2022-09-07 – 2022-10-24 (×2): qty 2, 1d supply, fill #0

## 2022-09-07 NOTE — Telephone Encounter (Signed)
Reaching out to patient to offer assistance regarding upcoming cardiac imaging study; pt verbalizes understanding of appt date/time. However patient will need to obtain '10mg'$  ivabradine to take in addition to '100mg'$  metoprolol tartrate. She will be moving this month and requests to delay her appt until April.  She is aware to get her medications from [facility] out patient pharmacy.  Gordy Clement RN Navigator Cardiac Imaging [facility] [phone] office [phone] cell

## 2022-09-08 ENCOUNTER — Other Ambulatory Visit (HOSPITAL_COMMUNITY): Payer: Self-pay

## 2022-09-08 ENCOUNTER — Other Ambulatory Visit: Payer: Self-pay

## 2022-09-08 ENCOUNTER — Ambulatory Visit (HOSPITAL_COMMUNITY): Admission: RE | Admit: 2022-09-08 | Payer: 59 | Source: Ambulatory Visit

## 2022-09-09 ENCOUNTER — Telehealth (INDEPENDENT_AMBULATORY_CARE_PROVIDER_SITE_OTHER): Payer: Self-pay | Admitting: Primary Care

## 2022-09-09 NOTE — Telephone Encounter (Signed)
We have received faxed and has been given to provider

## 2022-09-09 NOTE — Telephone Encounter (Signed)
Rhonda Hickman Calling from [employer] is calling because she received an order for a Powerwheel chair but the notes do not speak of the patients mobility. [name] will fax the eligiblity guidelines for the power wheel chair CB- [phone] [phone]

## 2022-09-10 ENCOUNTER — Encounter (HOSPITAL_COMMUNITY): Payer: Self-pay

## 2022-09-12 ENCOUNTER — Ambulatory Visit: Payer: 59 | Attending: Primary Care

## 2022-09-12 ENCOUNTER — Other Ambulatory Visit: Payer: Self-pay

## 2022-09-12 DIAGNOSIS — M6281 Muscle weakness (generalized): Secondary | ICD-10-CM | POA: Insufficient documentation

## 2022-09-12 DIAGNOSIS — M25612 Stiffness of left shoulder, not elsewhere classified: Secondary | ICD-10-CM | POA: Diagnosis present

## 2022-09-12 DIAGNOSIS — M25512 Pain in left shoulder: Secondary | ICD-10-CM | POA: Diagnosis not present

## 2022-09-12 NOTE — Therapy (Signed)
OUTPATIENT PHYSICAL THERAPY SHOULDER EVALUATION   Patient Name: Rhonda Hickman MRN: [id number] DOB:05-02-1956, 67 y.o., female Today's Date: 09/12/2022  END OF SESSION:  PT End of Session - 09/12/22 1326     Visit Number 1    Number of Visits 10    Date for PT Re-Evaluation 12/09/22    PT Start Time [id number]    PT Stop Time 1343   Patient had to leave early due to transportation.   PT Time Calculation (min) 21 min    Activity Tolerance Patient tolerated treatment well    Behavior During Therapy WFL for tasks assessed/performed             Past Medical History:  Diagnosis Date   Anemia    Anxiety    Asthma    Atrial fibrillation (HCC)    Bipolar 1 disorder (HCC)    Bulging lumbar disc    Chronic pain of left knee    COPD (chronic obstructive pulmonary disease) (HCC)    CVA (cerebral vascular accident) ([place])    Diabetes mellitus    Neuropathy    TIA (transient ischemic attack)    Vertigo    Past Surgical History:  Procedure Laterality Date   ABDOMINAL HYSTERECTOMY     CARPAL TUNNEL RELEASE Right    CHOLECYSTECTOMY     KNEE ARTHROSCOPY WITH LATERAL MENISECTOMY Left 06/08/2021   Procedure: KNEE ARTHROSCOPY WITH LATERAL MENISCECTOMY AND MEDIAL MENISCECTOMY;  Surgeon: Carole Civil, MD;  Location: [facility];  Service: Orthopedics;  Laterality: Left;   KNEE ARTHROSCOPY WITH MEDIAL MENISECTOMY Right 04/14/2016   Procedure: KNEE ARTHROSCOPY WITH MEDIAL AND LATERAL MENISECTOMY, MICROFRACTURE REPAIR;  Surgeon: Carole Civil, MD;  Location: [facility];  Service: Orthopedics;  Laterality: Right;  lateral menisectomy - needs crutch training   Patient Active Problem List   Diagnosis Date Noted   S/P left knee arthroscopy11/29/22 07/13/2021   Tear of meniscus of knee joint    Acute lower UTI 05/23/2021   A-fib ([place]) 05/22/2021   Leukocytosis 05/22/2021   Fall at home, initial encounter 05/22/2021    Class: Acute   HLD (hyperlipidemia) 05/22/2021   Hypertension 03/18/2020    Bipolar 1 disorder ([place])    Chronic atrial fibrillation ([place]) 12/03/2019   TIA (transient ischemic attack) 12/03/2019   Diabetic peripheral neuropathy ([place]) 12/03/2019   Chronic migraine 12/03/2019   Vertigo 08/03/2019   Bronchiectasis with acute exacerbation (HCC)    Obesity, Class III, BMI 40-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) ([place])    Gastroesophageal reflux disease    Asthma exacerbation 06/06/2018   Gait abnormality 08/29/2017   Paresthesia 08/29/2017   Derangement of posterior horn of medial meniscus of right knee    Meniscus, lateral, derangement, right    Arthritis of knee, right    COPD with acute exacerbation ([place]) 03/09/2016   Type 2 diabetes mellitus ([place]) 02/16/2007   Cocaine abuse ([place]) 02/16/2007   Extrinsic asthma 02/16/2007   HOMELESSNESS, HX OF 02/16/2007   REFERRING PROVIDER: [name], NP   REFERRING DIAG: Acute pain of left shoulder; Decreased ROM of left shoulder   THERAPY DIAG:  Acute pain of left shoulder  Stiffness of left shoulder, not elsewhere classified  Muscle weakness (generalized)  Rationale for Evaluation and Treatment: Rehabilitation  ONSET DATE: about 1 months ago   SUBJECTIVE:  SUBJECTIVE STATEMENT: Patient reports that her left shoulder has been bothering her for about a month. She feels like it is staying about the same since it first began. She has noticed that she cannot raise her arm on her own, but she is able to use her right arm to help raise her left arm. She feels that it may be due to having to move her wheelchair. She has never had any problems with her left shoulder prior to this. She is right hand dominant.   PERTINENT HISTORY: Atrial fibrillation, history of TIA, hypertension, asthma, COPD, diabetes, peripheral neuropathy, osteoarthritis, chronic  right knee pain, obesity, bipolar, current smoker, anxiety, and history of a CVA  PAIN:  Are you having pain? Yes: NPRS scale: 9/10 Pain location: left side of her neck radiating down to her elbow Pain description: throbbing, constant Aggravating factors: moving her arm, pushing her wheelchair  Relieving factors: medication   PRECAUTIONS: None  WEIGHT BEARING RESTRICTIONS: No  FALLS:  Has patient fallen in last 6 months? No  LIVING ENVIRONMENT: Lives with: lives alone and she has an aide 3 hours per day, 7 days per week Lives in: House/apartment Stairs: No Has following equipment at home: Wheelchair (manual)  OCCUPATION: Not working  PLOF: Needs assistance with ADLs  PATIENT GOALS: improved movement and reduced pain  NEXT MD VISIT: 09/29/22  OBJECTIVE:   PATIENT SURVEYS:  FOTO to be completed at first follow-up  COGNITION: Overall cognitive status: Within functional limits for tasks assessed     SENSATION: Patient reports intermittent numbness and tingling in both hands secondary to neuropathy.   POSTURE: Forward head, rounded shoulders, flexed trunk, and thoracic kyphosis  UPPER EXTREMITY ROM:   Active ROM Right eval Left eval  Shoulder flexion 135 76; limited by pain   Shoulder extension 67 44; limited by pain  Shoulder abduction 126 62  Shoulder adduction    Shoulder internal rotation To abdomen To abdomen  Shoulder external rotation To T4 To C7  Elbow flexion    Elbow extension    Wrist flexion    Wrist extension    Wrist ulnar deviation    Wrist radial deviation    Wrist pronation    Wrist supination    (Blank rows = not tested)  UPPER EXTREMITY MMT:  MMT Right eval Left eval  Shoulder flexion 4-/5 Unable to achieve proper testing position  Shoulder extension    Shoulder abduction 4-/5 Unable to achieve proper testing position  Shoulder adduction    Shoulder internal rotation 4/5 3/5; painful  Shoulder external rotation 4-/5 3/5; painful    Middle trapezius    Lower trapezius    Elbow flexion  4-/5  Elbow extension  4-/5  Wrist flexion    Wrist extension    Wrist ulnar deviation    Wrist radial deviation    Wrist pronation    Wrist supination    Grip strength (lbs) 35 14  (Blank rows = not tested)  JOINT MOBILITY TESTING:  Unable to assess due to time constraints   PALPATION:  TTP: left deltoid, biceps, upper trapezius, supraspinatus, infraspinatus, and subscapularis   TODAY'S TREATMENT:  DATE:    PATIENT EDUCATION: Education details: Plan of care, prognosis, and goals for therapy Person educated: Patient Education method: Explanation Education comprehension: verbalized understanding  HOME EXERCISE PROGRAM:   ASSESSMENT:  CLINICAL IMPRESSION: Patient is a 67 y.o. female who was seen today for physical therapy evaluation and treatment for left shoulder pain and stiffness.  She presented with moderate to moderate pain severity and irritability with left shoulder flexion and abduction being the most aggravating to her familiar symptoms.  She also exhibited reduced left upper extremity strength compared to the right upper extremity.  Left shoulder passive range of motion was unable to be assessed due to time constraints.  Recommend that she continue with skilled physical therapy to address her impairments to return to her prior level of function.  OBJECTIVE IMPAIRMENTS: decreased activity tolerance, decreased ROM, decreased strength, impaired sensation, impaired tone, impaired UE functional use, postural dysfunction, and pain.   ACTIVITY LIMITATIONS: carrying, lifting, dressing, reach over head, and hygiene/grooming  PARTICIPATION LIMITATIONS: cleaning and community activity  PERSONAL FACTORS: Transportation and 3+ comorbidities: Atrial fibrillation, history of TIA,  hypertension, asthma, COPD, diabetes, peripheral neuropathy, osteoarthritis, chronic right knee pain, obesity, bipolar, current smoker, anxiety, and history of a CVA  are also affecting patient's functional outcome.   REHAB POTENTIAL: Fair    CLINICAL DECISION MAKING: Evolving/moderate complexity  EVALUATION COMPLEXITY: Moderate   GOALS: Goals reviewed with patient? Yes  SHORT TERM GOALS: Target date: 09/26/22  Patient will be independent with her initial HEP.  Baseline: Goal status: INITIAL  2.  Patient will be able to complete her daily activities without her familiar pain exceeding 7/10. Baseline:  Goal status: INITIAL  3.  Patient will be able to able to demonstrate at least 90 degrees of left shoulder flexion for improved function reaching.  Baseline:  Goal status: INITIAL  4.  Patient will be able to demonstrate at least 90 degrees abduction for improved function reaching. Baseline:  Goal status: INITIAL  LONG TERM GOALS: Target date: 10/17/22  Patient will be independent with her advanced HEP.  Baseline:  Goal status: INITIAL  2.  Patient will be able to complete her daily activities without her familiar shoulder pain exceeding 5/10. Baseline:  Goal status: INITIAL  3.  Patient will be able to demonstrate at least 120 degrees of active shoulder flexion for improved function reaching overhead.  Baseline:  Goal status: INITIAL  4.  Patient will be able to demonstrate at least 120 degrees of active left shoulder flexion for improved function reaching overhead.  Baseline:  Goal status: INITIAL  PLAN:  PT FREQUENCY: 2x/week  PT DURATION: other: 5 weeks  PLANNED INTERVENTIONS: Therapeutic exercises, Therapeutic activity, Neuromuscular re-education, Patient/Family education, Self Care, Joint mobilization, Electrical stimulation, Cryotherapy, Moist heat, Vasopneumatic device, Manual therapy, and Re-evaluation  PLAN FOR NEXT SESSION: Pulleys, active assisted range  of motion, isometrics, and modalities as needed   Darlin Coco, PT 09/12/2022, 3:02 PM

## 2022-09-19 ENCOUNTER — Inpatient Hospital Stay: Admission: RE | Admit: 2022-09-19 | Payer: 59 | Source: Ambulatory Visit

## 2022-09-19 NOTE — Telephone Encounter (Signed)
Order for PT seating eval for power chair emailed to [employer]

## 2022-09-22 ENCOUNTER — Ambulatory Visit: Payer: 59 | Admitting: *Deleted

## 2022-09-27 ENCOUNTER — Telehealth: Payer: Self-pay | Admitting: Emergency Medicine

## 2022-09-27 NOTE — Telephone Encounter (Signed)
Copied from [place] [id number]. Topic: Transportation - Transportation >> Sep 27, 2022 12:25 PM [place] B wrote: Reason for CRM: Patient called in needs transportation for 03/21 appt, and has wheelchair

## 2022-09-27 NOTE — Telephone Encounter (Signed)
Returned pt call and lvm making pt aware that she will need to reach out to her insurance company in regards to transportation and if she has any questions or concerns to give [place] a call

## 2022-09-29 ENCOUNTER — Ambulatory Visit (INDEPENDENT_AMBULATORY_CARE_PROVIDER_SITE_OTHER): Payer: 59 | Admitting: Primary Care

## 2022-10-05 NOTE — Telephone Encounter (Unsigned)
Copied from [place] [id number]. Topic: General - Other >> Oct 05, 2022  8:33 AM [place] B wrote: Reason for CRM: tynisha from [employer] called in needs to know if pt has had a face to face office visit for the power wheehchair

## 2022-10-07 NOTE — Telephone Encounter (Signed)
Returned [place] call and made her aware that pt has an appt on 4/8 and pt has been referred to physical therapy.

## 2022-10-17 ENCOUNTER — Ambulatory Visit (INDEPENDENT_AMBULATORY_CARE_PROVIDER_SITE_OTHER): Payer: 59 | Admitting: Primary Care

## 2022-10-19 ENCOUNTER — Ambulatory Visit: Payer: 59 | Admitting: Physical Therapy

## 2022-10-24 ENCOUNTER — Other Ambulatory Visit (HOSPITAL_COMMUNITY): Payer: Self-pay

## 2022-10-26 ENCOUNTER — Ambulatory Visit (HOSPITAL_COMMUNITY): Admission: RE | Admit: 2022-10-26 | Payer: 59 | Source: Ambulatory Visit

## 2022-10-27 ENCOUNTER — Ambulatory Visit: Payer: 59 | Attending: Primary Care

## 2022-10-27 DIAGNOSIS — M25512 Pain in left shoulder: Secondary | ICD-10-CM | POA: Diagnosis present

## 2022-10-27 DIAGNOSIS — M25612 Stiffness of left shoulder, not elsewhere classified: Secondary | ICD-10-CM

## 2022-10-27 DIAGNOSIS — M6281 Muscle weakness (generalized): Secondary | ICD-10-CM | POA: Diagnosis present

## 2022-10-27 NOTE — Therapy (Addendum)
OUTPATIENT PHYSICAL THERAPY SHOULDER TREATMENT   Patient Name: Odaly Denby MRN: [id number] DOB:02-19-1956, 67 y.o., female Today's Date: 10/27/2022  END OF SESSION:  PT End of Session - 10/27/22 1042     Visit Number 2    Number of Visits 10    Date for PT Re-Evaluation 12/09/22    PT Start Time 1037    PT Stop Time 1105    PT Time Calculation (min) 28 min    Activity Tolerance Patient limited by pain    Behavior During Therapy WFL for tasks assessed/performed             Past Medical History:  Diagnosis Date   Anemia    Anxiety    Asthma    Atrial fibrillation    Bipolar 1 disorder    Bulging lumbar disc    Chronic pain of left knee    COPD (chronic obstructive pulmonary disease)    CVA (cerebral vascular accident)    Diabetes mellitus    Neuropathy    TIA (transient ischemic attack)    Vertigo    Past Surgical History:  Procedure Laterality Date   ABDOMINAL HYSTERECTOMY     CARPAL TUNNEL RELEASE Right    CHOLECYSTECTOMY     KNEE ARTHROSCOPY WITH LATERAL MENISECTOMY Left 06/08/2021   Procedure: KNEE ARTHROSCOPY WITH LATERAL MENISCECTOMY AND MEDIAL MENISCECTOMY;  Surgeon: Vickki Hearing, MD;  Location: [facility];  Service: Orthopedics;  Laterality: Left;   KNEE ARTHROSCOPY WITH MEDIAL MENISECTOMY Right 04/14/2016   Procedure: KNEE ARTHROSCOPY WITH MEDIAL AND LATERAL MENISECTOMY, MICROFRACTURE REPAIR;  Surgeon: Vickki Hearing, MD;  Location: [facility];  Service: Orthopedics;  Laterality: Right;  lateral menisectomy - needs crutch training   Patient Active Problem List   Diagnosis Date Noted   S/P left knee arthroscopy11/29/22 07/13/2021   Tear of meniscus of knee joint    Acute lower UTI 05/23/2021   A-fib 05/22/2021   Leukocytosis 05/22/2021   Fall at home, initial encounter 05/22/2021    Class: Acute   HLD (hyperlipidemia) 05/22/2021   Hypertension 03/18/2020   Bipolar 1 disorder    Chronic atrial fibrillation 12/03/2019   TIA (transient ischemic  attack) 12/03/2019   Diabetic peripheral neuropathy 12/03/2019   Chronic migraine 12/03/2019   Vertigo 08/03/2019   Bronchiectasis with acute exacerbation    Obesity, Class III, BMI 40-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)    Gastroesophageal reflux disease    Asthma exacerbation 06/06/2018   Gait abnormality 08/29/2017   Paresthesia 08/29/2017   Derangement of posterior horn of medial meniscus of right knee    Meniscus, lateral, derangement, right    Arthritis of knee, right    COPD with acute exacerbation 03/09/2016   Type 2 diabetes mellitus 02/16/2007   Cocaine abuse (HCC) 02/16/2007   Extrinsic asthma 02/16/2007   HOMELESSNESS, HX OF 02/16/2007   REFERRING PROVIDER: Grayce Sessions, NP   REFERRING DIAG: Acute pain of left shoulder; Decreased ROM of left shoulder   THERAPY DIAG:  Acute pain of left shoulder  Stiffness of left shoulder, not elsewhere classified  Muscle weakness (generalized)  Rationale for Evaluation and Treatment: Rehabilitation  ONSET DATE: about 1 months ago   SUBJECTIVE:  SUBJECTIVE STATEMENT: Patient reports that her shoulder is still hurting. She feels that heat helps the most.   PERTINENT HISTORY: Atrial fibrillation, history of TIA, hypertension, asthma, COPD, diabetes, peripheral neuropathy, osteoarthritis, chronic right knee pain, obesity, bipolar, current smoker, anxiety, and history of a CVA  PAIN:  Are you having pain? Yes: NPRS scale: 7/10 Pain location: left side of her neck radiating down to her elbow Pain description: throbbing, constant Aggravating factors: moving her arm, pushing her wheelchair  Relieving factors: medication   PRECAUTIONS: None  WEIGHT BEARING RESTRICTIONS: No  FALLS:  Has patient fallen in last 6 months? No  LIVING ENVIRONMENT: Lives with:  lives alone and she has an aide 3 hours per day, 7 days per week Lives in: House/apartment Stairs: No Has following equipment at home: Wheelchair (manual)  OCCUPATION: Not working  PLOF: Needs assistance with ADLs  PATIENT GOALS: improved movement and reduced pain  NEXT MD VISIT: 09/29/22  OBJECTIVE:   PATIENT SURVEYS:  FOTO to be completed at first follow-up  COGNITION: Overall cognitive status: Within functional limits for tasks assessed     SENSATION: Patient reports intermittent numbness and tingling in both hands secondary to neuropathy.   POSTURE: Forward head, rounded shoulders, flexed trunk, and thoracic kyphosis  UPPER EXTREMITY ROM:   Active ROM Right eval Left eval  Shoulder flexion 135 76; limited by pain   Shoulder extension 67 44; limited by pain  Shoulder abduction 126 62  Shoulder adduction    Shoulder internal rotation To abdomen To abdomen  Shoulder external rotation To T4 To C7  Elbow flexion    Elbow extension    Wrist flexion    Wrist extension    Wrist ulnar deviation    Wrist radial deviation    Wrist pronation    Wrist supination    (Blank rows = not tested)  UPPER EXTREMITY MMT:  MMT Right eval Left eval  Shoulder flexion 4-/5 Unable to achieve proper testing position  Shoulder extension    Shoulder abduction 4-/5 Unable to achieve proper testing position  Shoulder adduction    Shoulder internal rotation 4/5 3/5; painful  Shoulder external rotation 4-/5 3/5; painful   Middle trapezius    Lower trapezius    Elbow flexion  4-/5  Elbow extension  4-/5  Wrist flexion    Wrist extension    Wrist ulnar deviation    Wrist radial deviation    Wrist pronation    Wrist supination    Grip strength (lbs) 35 14  (Blank rows = not tested)  JOINT MOBILITY TESTING:  Unable to assess due to time constraints   PALPATION:  TTP: left deltoid, biceps, upper trapezius, supraspinatus, infraspinatus, and subscapularis   TODAY'S TREATMENT:  DATE:                                    4/18 EXERCISE LOG  Exercise Repetitions and Resistance Comments  Pulleys  10 reps Limited due to pain  L shoulder ADD isometrics  5 reps w/ 5 second hold  Limited due to pain               Blank cell = exercise not performed today  Manual Therapy Soft Tissue Mobilization: left deltoid, supraspinatus, and upper trapezius, for reduced pain and tone with moderate results   Modalities  Date:  Unattended Estim: Shoulder, pre mod @ 80-150 Hz, 6 mins, Pain Hot Pack: Shoulder, 6 mins, Pain  PATIENT EDUCATION: Education details: Plan of care, prognosis, and goals for therapy Person educated: Patient Education method: Explanation Education comprehension: verbalized understanding  HOME EXERCISE PROGRAM:   ASSESSMENT:  CLINICAL IMPRESSION: Patient presented with increased left shoulder pain. She was attempted to be introduced to left shoulder isometrics and AAROM. However, this was limited due to high pain severity and irritability. Soft tissue mobilization was able to moderately reduce her familiar pain initially. Modalities were utilized and this was able to reduce her symptoms initially, but it began to irritate her symptoms which resulted in the modality being removed. Soft tissue mobilization was attempted, but this was unable to reduce these symptoms. She reported that her shoulder was still hurting upon the conclusion of treatment. She continues to require skilled physical therapy to address her remaining impairments to maximize her functional mobility.   PHYSICAL THERAPY DISCHARGE SUMMARY  Visits from Start of Care: 2  Current functional level related to goals / functional outcomes: Patient was unable to meet her goals for therapy as she did not complete her recommended plan of care.    Remaining  deficits: Pain and left shoulder AROM    Education / Equipment: HEP   Patient agrees to discharge. Patient goals were not met. Patient is being discharged due to not returning since the last visit.  Candi Leash, PT, DPT    OBJECTIVE IMPAIRMENTS: decreased activity tolerance, decreased ROM, decreased strength, impaired sensation, impaired tone, impaired UE functional use, postural dysfunction, and pain.   ACTIVITY LIMITATIONS: carrying, lifting, dressing, reach over head, and hygiene/grooming  PARTICIPATION LIMITATIONS: cleaning and community activity  PERSONAL FACTORS: Transportation and 3+ comorbidities: Atrial fibrillation, history of TIA, hypertension, asthma, COPD, diabetes, peripheral neuropathy, osteoarthritis, chronic right knee pain, obesity, bipolar, current smoker, anxiety, and history of a CVA  are also affecting patient's functional outcome.   REHAB POTENTIAL: Fair    CLINICAL DECISION MAKING: Evolving/moderate complexity  EVALUATION COMPLEXITY: Moderate   GOALS: Goals reviewed with patient? Yes  SHORT TERM GOALS: Target date: 09/26/22  Patient will be independent with her initial HEP.  Baseline: Goal status: INITIAL  2.  Patient will be able to complete her daily activities without her familiar pain exceeding 7/10. Baseline:  Goal status: INITIAL  3.  Patient will be able to able to demonstrate at least 90 degrees of left shoulder flexion for improved function reaching.  Baseline:  Goal status: INITIAL  4.  Patient will be able to demonstrate at least 90 degrees abduction for improved function reaching. Baseline:  Goal status: INITIAL  LONG TERM GOALS: Target date: 10/17/22  Patient will be independent with her advanced HEP.  Baseline:  Goal status: INITIAL  2.  Patient will be able  to complete her daily activities without her familiar shoulder pain exceeding 5/10. Baseline:  Goal status: INITIAL  3.  Patient will be able to demonstrate at least 120  degrees of active shoulder flexion for improved function reaching overhead.  Baseline:  Goal status: INITIAL  4.  Patient will be able to demonstrate at least 120 degrees of active left shoulder flexion for improved function reaching overhead.  Baseline:  Goal status: INITIAL  PLAN:  PT FREQUENCY: 2x/week  PT DURATION: other: 5 weeks  PLANNED INTERVENTIONS: Therapeutic exercises, Therapeutic activity, Neuromuscular re-education, Patient/Family education, Self Care, Joint mobilization, Electrical stimulation, Cryotherapy, Moist heat, Vasopneumatic device, Manual therapy, and Re-evaluation  PLAN FOR NEXT SESSION: Pulleys, active assisted range of motion, isometrics, and modalities as needed   Granville Lewis, PT 10/27/2022, 12:53 PM

## 2022-11-01 ENCOUNTER — Ambulatory Visit
Admission: RE | Admit: 2022-11-01 | Discharge: 2022-11-01 | Disposition: A | Discharge status: Home / Self Care | Payer: 59 | Source: Ambulatory Visit | Attending: Primary Care | Admitting: Primary Care

## 2022-11-01 DIAGNOSIS — Z1231 Encounter for screening mammogram for malignant neoplasm of breast: Secondary | ICD-10-CM

## 2022-11-02 ENCOUNTER — Telehealth (HOSPITAL_COMMUNITY): Payer: Self-pay | Admitting: Emergency Medicine

## 2022-11-02 NOTE — Telephone Encounter (Signed)
Attempted to call patient regarding upcoming cardiac CT appointment. °Left message on voicemail with name and callback number °Aloysius Heinle RN Navigator Cardiac Imaging °[facility] °[phone] Office °[phone] Cell ° °

## 2022-11-03 ENCOUNTER — Other Ambulatory Visit (INDEPENDENT_AMBULATORY_CARE_PROVIDER_SITE_OTHER): Payer: Self-pay | Admitting: Primary Care

## 2022-11-03 ENCOUNTER — Ambulatory Visit (HOSPITAL_COMMUNITY)
Admission: RE | Admit: 2022-11-03 | Discharge: 2022-11-03 | Disposition: A | Discharge status: Home / Self Care | Payer: 59 | Source: Ambulatory Visit | Attending: Cardiology | Admitting: Cardiology

## 2022-11-03 DIAGNOSIS — Z760 Encounter for issue of repeat prescription: Secondary | ICD-10-CM

## 2022-11-03 DIAGNOSIS — I152 Hypertension secondary to endocrine disorders: Secondary | ICD-10-CM

## 2022-11-03 DIAGNOSIS — I2510 Atherosclerotic heart disease of native coronary artery without angina pectoris: Secondary | ICD-10-CM | POA: Insufficient documentation

## 2022-11-03 DIAGNOSIS — R072 Precordial pain: Secondary | ICD-10-CM

## 2022-11-03 DIAGNOSIS — E782 Mixed hyperlipidemia: Secondary | ICD-10-CM

## 2022-11-03 DIAGNOSIS — Z794 Long term (current) use of insulin: Secondary | ICD-10-CM

## 2022-11-03 DIAGNOSIS — R079 Chest pain, unspecified: Secondary | ICD-10-CM | POA: Diagnosis present

## 2022-11-03 LAB — POCT I-STAT CREATININE: Creatinine, Ser: 0.9 mg/dL (ref 0.44–1.00)

## 2022-11-03 MED ORDER — DILTIAZEM HCL 25 MG/5ML IV SOLN
INTRAVENOUS | Status: AC
  Filled 2022-11-03: qty 5

## 2022-11-03 MED ORDER — DILTIAZEM HCL 25 MG/5ML IV SOLN
10.0000 mg | Freq: Once | INTRAVENOUS | Status: AC
  Administered 2022-11-03: 10 mg via INTRAVENOUS

## 2022-11-03 MED ORDER — METOPROLOL TARTRATE 5 MG/5ML IV SOLN
10.0000 mg | Freq: Once | INTRAVENOUS | Status: AC
  Administered 2022-11-03: 10 mg via INTRAVENOUS

## 2022-11-03 MED ORDER — NITROGLYCERIN 0.4 MG SL SUBL
SUBLINGUAL_TABLET | SUBLINGUAL | Status: AC
  Filled 2022-11-03: qty 2

## 2022-11-03 MED ORDER — IOHEXOL 350 MG/ML SOLN
100.0000 mL | Freq: Once | INTRAVENOUS | Status: AC | PRN
  Administered 2022-11-03: 100 mL via INTRAVENOUS

## 2022-11-03 MED ORDER — METOPROLOL TARTRATE 5 MG/5ML IV SOLN
INTRAVENOUS | Status: AC
  Filled 2022-11-03: qty 10

## 2022-11-03 MED ORDER — NITROGLYCERIN 0.4 MG SL SUBL
0.8000 mg | SUBLINGUAL_TABLET | Freq: Once | SUBLINGUAL | Status: AC
  Administered 2022-11-03: 0.8 mg via SUBLINGUAL

## 2022-11-03 NOTE — Telephone Encounter (Signed)
Requested Prescriptions  Pending Prescriptions Disp Refills   metFORMIN (GLUCOPHAGE) 1000 MG tablet [Pharmacy Med Name: metformin 1,000 mg tablet] 180 tablet 0    Sig: TAKE ONE TABLET BY MOUTH TWICE DAILY @ 9AM & 5PM WITH MEALS     Endocrinology:  Diabetes - Biguanides Failed - 11/03/2022 12:38 PM      Failed - Cr in normal range and within 360 days    Creatinine, Ser  Date Value Ref Range Status  06/20/2022 1.02 (H) 0.57 - 1.00 mg/dL Final   Creatinine, Urine  Date Value Ref Range Status  12/17/2019 128.15 mg/dL Final    Comment:    Performed at [facility], [address], [place], [place] [address]         Failed - HBA1C is between 0 and 7.9 and within 180 days    Hemoglobin A1C  Date Value Ref Range Status  02/25/2022 8.3 (A) 4.0 - 5.6 % Final   Hgb A1c MFr Bld  Date Value Ref Range Status  05/22/2021 8.9 (H) 4.8 - 5.6 % Final    Comment:    (NOTE) Pre diabetes:          5.7%-6.4%  Diabetes:              >6.4%  Glycemic control for   <7.0% adults with diabetes          Failed - B12 Level in normal range and within 720 days    Vitamin B-12  Date Value Ref Range Status  06/02/2020 460 232 - 1,245 pg/mL Final         Failed - CBC within normal limits and completed in the last 12 months    WBC  Date Value Ref Range Status  06/20/2022 7.7 3.4 - 10.8 x10E3/uL Final  05/24/2021 10.2 4.0 - 10.5 K/uL Final   RBC  Date Value Ref Range Status  06/20/2022 4.48 3.77 - 5.28 x10E6/uL Final  05/24/2021 3.62 (L) 3.87 - 5.11 MIL/uL Final   Hemoglobin  Date Value Ref Range Status  06/20/2022 12.8 11.1 - 15.9 g/dL Final   Hematocrit  Date Value Ref Range Status  06/20/2022 39.2 34.0 - 46.6 % Final   MCHC  Date Value Ref Range Status  06/20/2022 32.7 31.5 - 35.7 g/dL Final  24/40/1027 25.3 30.0 - 36.0 g/dL Final   [facility]  Date Value Ref Range Status  06/20/2022 28.6 26.6 - 33.0 pg Final  05/24/2021 29.6 26.0 - 34.0 pg Final   MCV  Date Value Ref Range Status   06/20/2022 88 79 - 97 fL Final   No results found for: "PLTCOUNTKUC", "LABPLAT", "POCPLA" RDW  Date Value Ref Range Status  06/20/2022 14.6 11.7 - 15.4 % Final         Passed - eGFR in normal range and within 360 days    GFR calc Af Amer  Date Value Ref Range Status  03/17/2020 >60 >60 mL/min Final   GFR, Estimated  Date Value Ref Range Status  05/24/2021 >60 >60 mL/min Final    Comment:    (NOTE) Calculated using the CKD-EPI Creatinine Equation (2021)    eGFR  Date Value Ref Range Status  06/20/2022 61 >59 mL/min/1.73 Final         Passed - Valid encounter within last 6 months    Recent Outpatient Visits           2 months ago Acute pain of left shoulder   [place] [facility] [place],  Kinnie Scales, NP   4 months ago Type 2 diabetes mellitus with 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, with long-term current use of insulin (HCC)   [place] [facility] Grayce Sessions, NP   5 months ago Encounter for [employer] annual wellness exam   [place] [facility] Grayce Sessions, NP   7 months ago Type 2 diabetes mellitus with 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, with long-term current use of insulin (HCC)   [place] [facility] Grayce Sessions, NP   8 months ago Type 2 diabetes mellitus with 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, with long-term current use of insulin [facility])   [place] [facility] Grayce Sessions, NP       Future Appointments             In 4 days Randa Evens, Kinnie Scales, NP  [facility]

## 2022-11-04 NOTE — Telephone Encounter (Signed)
Unable to refill per protocol, Rx request is too soon. Last refill 03/02/22 for 100 and 2 refills.  Requested Prescriptions  Pending Prescriptions Disp Refills   amLODipine (NORVASC) 5 MG tablet [Pharmacy Med Name: amLODIPine Besylate 5 MG Oral Tablet] 100 tablet 2    Sig: TAKE 1 TABLET BY MOUTH DAILY     Cardiovascular: Calcium Channel Blockers 2 Passed - 11/03/2022 10:24 PM      Passed - Last BP in normal range    BP Readings from Last 1 Encounters:  11/03/22 108/80         Passed - Last Heart Rate in normal range    Pulse Readings from Last 1 Encounters:  11/03/22 66         Passed - Valid encounter within last 6 months    Recent Outpatient Visits           2 months ago Acute pain of left shoulder   [place] [facility] Grayce Sessions, NP   4 months ago Type 2 diabetes mellitus with 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, with long-term current use of insulin (HCC)   [place] [facility] Grayce Sessions, NP   5 months ago Encounter for [employer] annual wellness exam   [place] [facility] Grayce Sessions, NP   7 months ago Type 2 diabetes mellitus with 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, with long-term current use of insulin (HCC)   [place] [facility] Grayce Sessions, NP   8 months ago Type 2 diabetes mellitus with 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, with long-term current use of insulin [facility])   [place] [facility] Grayce Sessions, NP       Future Appointments             In 3 days Randa Evens, Kinnie Scales, NP [place] [facility]

## 2022-11-07 ENCOUNTER — Encounter (INDEPENDENT_AMBULATORY_CARE_PROVIDER_SITE_OTHER): Payer: Self-pay | Admitting: Primary Care

## 2022-11-07 ENCOUNTER — Ambulatory Visit (INDEPENDENT_AMBULATORY_CARE_PROVIDER_SITE_OTHER): Payer: 59 | Admitting: Primary Care

## 2022-11-07 VITALS — BP 133/74 | HR 73 | Resp 16 | Ht 72.0 in | Wt 278.0 lb

## 2022-11-07 DIAGNOSIS — E119 Type 2 diabetes mellitus without complications: Secondary | ICD-10-CM

## 2022-11-07 DIAGNOSIS — R2681 Unsteadiness on feet: Secondary | ICD-10-CM

## 2022-11-07 DIAGNOSIS — E1100 Type 2 diabetes mellitus with hyperosmolarity without nonketotic hyperglycemic-hyperosmolar coma (NKHHC): Secondary | ICD-10-CM | POA: Diagnosis not present

## 2022-11-07 DIAGNOSIS — Z794 Long term (current) use of insulin: Secondary | ICD-10-CM | POA: Diagnosis not present

## 2022-11-07 DIAGNOSIS — L602 Onychogryphosis: Secondary | ICD-10-CM | POA: Diagnosis not present

## 2022-11-07 DIAGNOSIS — I10 Essential (primary) hypertension: Secondary | ICD-10-CM | POA: Diagnosis not present

## 2022-11-07 DIAGNOSIS — Z760 Encounter for issue of repeat prescription: Secondary | ICD-10-CM

## 2022-11-07 LAB — POCT GLYCOSYLATED HEMOGLOBIN (HGB A1C): HbA1c, POC (controlled diabetic range): 7.7 % — AB (ref 0.0–7.0)

## 2022-11-07 NOTE — Progress Notes (Signed)
[facility]  Rhonda Hickman, is a 67 y.o. female  UJW:[id number]  FAO:[id number]  DOB - 1956/05/09  No chief complaint on file.      Subjective:   Rhonda Hickman is a 67 y.o. female here today for a follow up visit on HTN. Patient has No headache, No chest pain, No abdominal pain - No Nausea, No new weakness tingling or numbness, No Cough - shortness of breath. Diabetes and requesting a w/c . (Face to face today). Power wheel chair hx of shoulder pain and hand and arms give out trying to propel a wheel chair. Patient suffers from gait stability and old CVA which impairs their ability to perform daily activities like feeding and toileting in the home.  A power  wheel chair will help improve issues  with performing activities of daily living. A wheelchair will allow patient to safely perform daily activities. Patient can safely navigate power w/c in the home or has a caregiver who can provide assistance.  Duration of need: 99 years  Allergies  Allergen Reactions   Aspirin Nausea Only and Other (See Comments)    Causes stomach pain    Past Medical History:  Diagnosis Date   Anemia    Anxiety    Asthma    Atrial fibrillation (HCC)    Bipolar 1 disorder (HCC)    Bulging lumbar disc    Chronic pain of left knee    COPD (chronic obstructive pulmonary disease) (HCC)    CVA (cerebral vascular accident) (HCC)    Diabetes mellitus    Neuropathy    TIA (transient ischemic attack)    Vertigo     Current Outpatient Medications on File Prior to Visit  Medication Sig Dispense Refill   albuterol (VENTOLIN HFA) 108 (90 Base) MCG/ACT inhaler INHALE 2 PUFFS EVERY 6 HOURS AS NEEDED FOR WHEEZING OR SHORTNESS OF BREATH 18 g 1   amLODipine (NORVASC) 5 MG tablet TAKE 1 TABLET BY MOUTH DAILY 100 tablet 2   blood glucose meter kit and supplies Dispense based on patient and insurance preference. Use up to four times daily as directed. (FOR ICD-10 E10.9, E11.9). 1 each 0   buPROPion  (WELLBUTRIN SR) 150 MG 12 hr tablet TAKE ONE TABLET BY MOUTH TWICE DAILY @ 9AM & 9PM 180 tablet 11   citalopram (CELEXA) 40 MG tablet TAKE ONE TABLET BY MOUTH DAILY AT 9AM (Patient not taking: Reported on 06/14/2022) 90 tablet 1   divalproex (DEPAKOTE ER) 500 MG 24 hr tablet Take 1-2 tablets by mouth 2 (two) times daily. One tablet in the morning and 2 tablets at bedtime     FEROSUL 325 (65 Fe) MG tablet TAKE ONE TABLET BY MOUTH DAILY AT 9AM WITH BREAKFAST 90 tablet 11   fluticasone (FLONASE) 50 MCG/ACT nasal spray Place 2 sprays into both nostrils daily. 16 g 6   glucose blood (ACCU-CHEK GUIDE) test strip CHECK BLOOD SUGAR UP TO FOUR TIMES DAILY AS DIRECTED 100 strip 11   GNP ULTICARE PEN NEEDLES 32G X 4 MM MISC USE TO INJECT INSULIN UP TO 5 TIMES A DAY 200 each 6   HYDROcodone-acetaminophen (NORCO) 10-325 MG tablet Take 1 tablet by mouth 4 (four) times daily as needed.     insulin glargine (LANTUS SOLOSTAR) 100 UNIT/ML Solostar Pen Inject 20 Units into the skin 2 (two) times daily. 15 mL 0   insulin glargine (LANTUS) 100 UNIT/ML injection Inject 0.2 mLs (20 Units total) into the skin 2 (two) times daily. 10 mL  3   insulin lispro (HUMALOG) 100 UNIT/ML KwikPen Inject 20 Units into the skin 2 (two) times daily. (Patient not taking: Reported on 06/14/2022)     Insulin Syringe-Needle U-100 (ADVOCATE INSULIN SYRINGE) 29G X 1/2" 0.3 ML MISC Use to inject insulin 5x daily. 200 each 6   ivabradine (CORLANOR) 5 MG TABS tablet Take tablets (10mg ) TWO hours prior to your cardiac CT scan. 2 tablet 0   metFORMIN (GLUCOPHAGE) 1000 MG tablet TAKE ONE TABLET BY MOUTH TWICE DAILY @ 9AM & 5PM WITH MEALS 180 tablet 0   metoprolol tartrate (LOPRESSOR) 50 MG tablet TAKE ONE TABLET (50MG ) BY MOUTH TWICE DAILY @9AM -5PM 180 tablet 3   oxybutynin (DITROPAN) 5 MG tablet TAKE 1 TABLET BY MOUTH 2 TO 3  TIMES DAILY FOR BLADDER SPASMS 270 tablet 3   oxyCODONE-acetaminophen (PERCOCET) 7.5-325 MG tablet Take 1 tablet by mouth every  4 (four) hours as needed for severe pain. 30 tablet 0   OZEMPIC, 0.25 OR 0.5 MG/DOSE, 2 MG/3ML SOPN INJECT SUBCUTANEOUSLY 0.5 MG  EVERY WEEK 9 mL 3   PEG-KCl-NaCl-NaSulf-Na Asc-C (PLENVU) 140 g SOLR Take 140 g by mouth as directed. Manufacturer's coupon Universal coupon code:BIN: [id number]; GROUP: [id number]; PCN: [id number]; ID: [id number]; PAY NO MORE $50 (Patient not taking: Reported on 06/14/2022) 1 each 0   rivaroxaban (XARELTO) 20 MG TABS tablet TAKE ONE TABLET BY MOUTH DAILY AT 5PM WITH SUPPER 90 tablet 2   rosuvastatin (CRESTOR) 10 MG tablet Take 1 tablet (10 mg total) by mouth daily. 60 tablet 2   SYMBICORT 160-4.5 MCG/ACT inhaler INHALE TWO PUFFS BY MOUTH TWICE DAILY (BULK) 10.2 g 11   No current facility-administered medications on file prior to visit.    Objective:   Comprehensive ROS Pertinent positive and negative noted in HPI   Exam General appearance : Awake, alert, not in any distress. Speech Clear. Not toxic looking HEENT: Atraumatic and Normocephalic, pupils equally reactive to light and accomodation Neck: Supple, no JVD. No cervical lymphadenopathy.  Chest: Good air entry bilaterally, no added sounds  CVS: S1 S2 regular, no murmurs.  Abdomen: Bowel sounds present, Non tender and not distended with no gaurding, rigidity or rebound. Extremities: able to raise arms 45 degrees, B/L Lower Ext weakness and unstable gait only able to navigate a wheelchair no edema, both legs are warm to touch Neurology: Awake alert, and oriented X 3, Non focal Skin: No Rash  Data Review Lab Results  Component Value Date   HGBA1C 7.7 (A) 11/07/2022   HGBA1C 8.3 (A) 02/25/2022   HGBA1C 7.5 (A) 10/06/2021    Assessment & Plan  Diagnoses and all orders for this visit:  Type 2 diabetes mellitus with 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, with long-term current use of insulin (HCC) -     POCT glycosylated hemoglobin (Hb A1C) -     Ambulatory referral to Podiatry  Gait instability See HPI  Primary  hypertension BP goal - < 130/80- 140/90 Explained that having normal blood pressure is the goal and medications are helping to get to goal and maintain normal blood pressure. DIET: Limit salt intake, read nutrition labels to check salt content, limit fried and high fatty foods  Avoid using multisymptom OTC cold preparations that generally contain sudafed which can rise BP. Consult with pharmacist on best cold relief products to use for persons with HTN EXERCISE Discussed incorporating exercise such as walking - 30 minutes most days of the week and can do in 10 minute intervals     Onychauxis -  Ambulatory referral to Podiatry  Medication refill  Comprehensive diabetic foot examination, type 2 DM, encounter for [facility]) Completed    Return in about 3 months (around 02/06/2023) for DM.  The patient was given clear instructions to go to ER or return to medical center if symptoms don't improve, worsen or new problems develop. The patient verbalized understanding. The patient was told to call to get lab results if they haven't heard anything in the next week.   This note has been created with Education officer, environmental. Any transcriptional errors are unintentional.   Grayce Sessions, NP 11/07/2022, 2:14 PM

## 2022-11-09 ENCOUNTER — Telehealth: Payer: Self-pay | Admitting: Cardiology

## 2022-11-09 MED ORDER — ROSUVASTATIN CALCIUM 10 MG PO TABS: 10.0000 mg | ORAL_TABLET | Freq: Every day | ORAL | 2 refills | Status: DC

## 2022-11-09 NOTE — Telephone Encounter (Signed)
Patient given results of her CT. She states understanding after much explanation.  She then states she has not taken the Rosuvastatin.  I do see where was sent through [employer] and she states if she was taking, then she isn't now.  Was re-sent to her local pharmacy per her request.  She will start this once picked up.

## 2022-11-09 NOTE — Telephone Encounter (Signed)
Patient is calling back with questions regarding the CT results left on her VM. Please advise.

## 2022-11-10 ENCOUNTER — Ambulatory Visit: Payer: 59

## 2022-11-11 ENCOUNTER — Other Ambulatory Visit (INDEPENDENT_AMBULATORY_CARE_PROVIDER_SITE_OTHER): Payer: Self-pay | Admitting: Primary Care

## 2022-11-11 DIAGNOSIS — E1100 Type 2 diabetes mellitus with hyperosmolarity without nonketotic hyperglycemic-hyperosmolar coma (NKHHC): Secondary | ICD-10-CM

## 2022-11-11 DIAGNOSIS — Z760 Encounter for issue of repeat prescription: Secondary | ICD-10-CM

## 2022-11-11 DIAGNOSIS — Z794 Long term (current) use of insulin: Secondary | ICD-10-CM

## 2022-11-11 NOTE — Telephone Encounter (Signed)
No longer listed on current medication list Requested Prescriptions  Pending Prescriptions Disp Refills   JANUVIA 100 MG tablet [Pharmacy Med Name: Januvia 100 MG Oral Tablet] 80 tablet 3    Sig: TAKE 1 TABLET BY MOUTH DAILY     Endocrinology:  Diabetes - DPP-4 Inhibitors Passed - 11/11/2022  7:19 AM      Passed - HBA1C is between 0 and 7.9 and within 180 days    HbA1c, POC (controlled diabetic range)  Date Value Ref Range Status  11/07/2022 7.7 (A) 0.0 - 7.0 % Final         Passed - Cr in normal range and within 360 days    Creatinine, Ser  Date Value Ref Range Status  11/03/2022 0.90 0.44 - 1.00 mg/dL Final   Creatinine, Urine  Date Value Ref Range Status  12/17/2019 128.15 mg/dL Final    Comment:    Performed at [facility], [address], [place], [place] [address]         Passed - Valid encounter within last 6 months    Recent Outpatient Visits           4 days ago Type 2 diabetes mellitus with 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, with long-term current use of insulin (HCC)   [place] [facility] Grayce Sessions, NP   2 months ago Acute pain of left shoulder   [place] [facility] Grayce Sessions, NP   4 months ago Type 2 diabetes mellitus with 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, with long-term current use of insulin (HCC)   [place] [facility] Grayce Sessions, NP   5 months ago Encounter for [employer] annual wellness exam   [place] [facility] Grayce Sessions, NP   7 months ago Type 2 diabetes mellitus with 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, with long-term current use of insulin (HCC)   [place] [facility] Grayce Sessions, NP       Future Appointments             In 1 week McCaughan, Nancy Marus, DPM [facility] at [place], [place]   In 3 months Randa Evens, Kinnie Scales, NP [place] [facility]

## 2022-11-21 ENCOUNTER — Telehealth: Payer: Self-pay | Admitting: Podiatry

## 2022-11-21 ENCOUNTER — Ambulatory Visit: Payer: 59 | Admitting: Podiatry

## 2022-11-21 NOTE — Telephone Encounter (Signed)
Pt left message today at 639am stating she was sick and would not be able to make appt today. Will call to r/s.   Pt was not a no show for this appt.

## 2022-12-02 ENCOUNTER — Other Ambulatory Visit (INDEPENDENT_AMBULATORY_CARE_PROVIDER_SITE_OTHER): Payer: Self-pay | Admitting: Primary Care

## 2022-12-02 DIAGNOSIS — F3340 Major depressive disorder, recurrent, in remission, unspecified: Secondary | ICD-10-CM

## 2022-12-02 DIAGNOSIS — Z760 Encounter for issue of repeat prescription: Secondary | ICD-10-CM

## 2022-12-02 DIAGNOSIS — E1100 Type 2 diabetes mellitus with hyperosmolarity without nonketotic hyperglycemic-hyperosmolar coma (NKHHC): Secondary | ICD-10-CM

## 2022-12-08 ENCOUNTER — Ambulatory Visit: Payer: 59

## 2022-12-09 ENCOUNTER — Other Ambulatory Visit: Payer: Self-pay

## 2022-12-09 ENCOUNTER — Encounter (HOSPITAL_COMMUNITY): Payer: Self-pay

## 2022-12-09 ENCOUNTER — Emergency Department (HOSPITAL_COMMUNITY): Payer: 59

## 2022-12-09 ENCOUNTER — Emergency Department (HOSPITAL_COMMUNITY)
Admission: EM | Admit: 2022-12-09 | Discharge: 2022-12-09 | Disposition: A | Discharge status: Home / Self Care | Payer: 59 | Attending: Emergency Medicine | Admitting: Emergency Medicine

## 2022-12-09 DIAGNOSIS — Z79899 Other long term (current) drug therapy: Secondary | ICD-10-CM | POA: Insufficient documentation

## 2022-12-09 DIAGNOSIS — Z7901 Long term (current) use of anticoagulants: Secondary | ICD-10-CM | POA: Insufficient documentation

## 2022-12-09 DIAGNOSIS — Z7984 Long term (current) use of oral hypoglycemic drugs: Secondary | ICD-10-CM | POA: Insufficient documentation

## 2022-12-09 DIAGNOSIS — M25512 Pain in left shoulder: Secondary | ICD-10-CM | POA: Insufficient documentation

## 2022-12-09 DIAGNOSIS — M542 Cervicalgia: Secondary | ICD-10-CM | POA: Insufficient documentation

## 2022-12-09 DIAGNOSIS — Z794 Long term (current) use of insulin: Secondary | ICD-10-CM | POA: Diagnosis not present

## 2022-12-09 MED ORDER — LIDOCAINE 5 % EX PTCH: 1.0000 | MEDICATED_PATCH | CUTANEOUS | 0 refills | Status: DC

## 2022-12-09 NOTE — Discharge Instructions (Addendum)
The x-ray of your shoulder shows that you have degenerative changes to the shoulder.  Use the prescribed lidocaine patches as directed.  You may also try over-the-counter diclofenac gel as directed.  Do not use both at the same time.  Please follow-up with your primary care doctor for recheck.

## 2022-12-09 NOTE — ED Provider Notes (Signed)
[place] EMERGENCY DEPARTMENT AT [facility] Provider Note   CSN: [id number] Arrival date & time: 12/09/22  0830     History  Chief Complaint  Patient presents with   Shoulder Pain    Rhonda Hickman is a 67 y.o. female.   Shoulder Pain Associated symptoms: neck pain   Associated symptoms: no fever        Rhonda Hickman is a 67 y.o. female who presents to the Emergency Department complaining of left shoulder pain x 1 month.  Pain worse with movement and improves at rest.  She describes generalized aching pain to her entire shoulder that radiates into her upper arm, left neck, left chest and left upper back.  Pain does improve after taking narcotic pain medication.  She also takes Tylenol arthritis with some improvement as well.  She denies any swelling, numbness or tingling of the extremity, shortness of breath, and headaches.  No known injury.  Home Medications Prior to Admission medications   Medication Sig Start Date End Date Taking? Authorizing Provider  ACCU-CHEK GUIDE test strip CHECK BLOOD SUGAR UP TO FOUR TIMES DAILY AS DIRECTED 12/02/22   Grayce Sessions, NP  albuterol (VENTOLIN HFA) 108 (90 Base) MCG/ACT inhaler INHALE 2 PUFFS EVERY 6 HOURS AS NEEDED FOR WHEEZING OR SHORTNESS OF BREATH 03/10/21   Grayce Sessions, NP  amLODipine (NORVASC) 5 MG tablet TAKE 1 TABLET BY MOUTH DAILY 03/02/22   Grayce Sessions, NP  blood glucose meter kit and supplies Dispense based on patient and insurance preference. Use up to four times daily as directed. (FOR ICD-10 E10.9, E11.9). 08/03/21   Grayce Sessions, NP  buPROPion [facility] SR) 150 MG 12 hr tablet TAKE ONE TABLET BY MOUTH TWICE DAILY @ 9AM & 9PM 08/14/22   Grayce Sessions, NP  citalopram (CELEXA) 40 MG tablet TAKE ONE TABLET BY MOUTH DAILY AT 9AM 12/02/22   Grayce Sessions, NP  divalproex (DEPAKOTE ER) 500 MG 24 hr tablet Take 1-2 tablets by mouth 2 (two) times daily. One tablet in the morning and 2  tablets at bedtime 05/19/21   [provider]  FEROSUL 325 (65 Fe) MG tablet TAKE ONE TABLET BY MOUTH DAILY AT 9AM WITH BREAKFAST 02/14/22   Grayce Sessions, NP  fluticasone (FLONASE) 50 MCG/ACT nasal spray Place 2 sprays into both nostrils daily. 06/08/20   Grayce Sessions, NP  GNP ULTICARE PEN NEEDLES 32G X 4 MM MISC USE TO INJECT INSULIN UP TO 5 TIMES A DAY 01/07/22   Grayce Sessions, NP  HYDROcodone-acetaminophen (NORCO) 10-325 MG tablet Take 1 tablet by mouth 4 (four) times daily as needed. 06/10/22   [provider]  insulin glargine (LANTUS SOLOSTAR) 100 UNIT/ML Solostar Pen Inject 20 Units into the skin 2 (two) times daily. 04/14/22   Grayce Sessions, NP  insulin glargine (LANTUS) 100 UNIT/ML injection Inject 0.2 mLs (20 Units total) into the skin 2 (two) times daily. 07/20/22   Grayce Sessions, NP  insulin lispro (HUMALOG) 100 UNIT/ML KwikPen Inject 20 Units into the skin 2 (two) times daily. Patient not taking: Reported on 06/14/2022 01/12/22   [provider]  Insulin Syringe-Needle U-100 (ADVOCATE INSULIN SYRINGE) 29G X 1/2" 0.3 ML MISC Use to inject insulin 5x daily. 09/07/20   Grayce Sessions, NP  ivabradine (CORLANOR) 5 MG TABS tablet Take tablets (10mg ) TWO hours prior to your cardiac CT scan. 09/07/22   Little Ishikawa, MD  metFORMIN (GLUCOPHAGE) 1000 MG  tablet TAKE ONE TABLET BY MOUTH TWICE DAILY @ 9AM & 5PM WITH MEALS 11/03/22   Grayce Sessions, NP  metoprolol tartrate (LOPRESSOR) 50 MG tablet TAKE ONE TABLET (50MG ) BY MOUTH TWICE DAILY @9AM -5PM 06/17/22   Little Ishikawa, MD  oxybutynin (DITROPAN) 5 MG tablet TAKE 1 TABLET BY MOUTH 2 TO 3  TIMES DAILY FOR BLADDER SPASMS 07/05/22   Grayce Sessions, NP  oxyCODONE-acetaminophen (PERCOCET) 7.5-325 MG tablet Take 1 tablet by mouth every 4 (four) hours as needed for severe pain. 06/08/21   Vickki Hearing, MD  OZEMPIC, 0.25 OR 0.5 MG/DOSE, 2 MG/3ML SOPN INJECT  SUBCUTANEOUSLY 0.5 MG  EVERY WEEK 06/05/22   Grayce Sessions, NP  PEG-KCl-NaCl-NaSulf-Na Asc-C (PLENVU) 140 g SOLR Take 140 g by mouth as directed. Manufacturer's coupon Universal coupon code:BIN: G6837245; GROUP: [id number]; PCN: [id number]; ID: [id number]; PAY NO MORE $50 Patient not taking: Reported on 06/14/2022 03/03/20   Tressia Danas, MD  rivaroxaban (XARELTO) 20 MG TABS tablet TAKE ONE TABLET BY MOUTH DAILY AT 5PM WITH SUPPER 06/27/22   Grayce Sessions, NP  rosuvastatin (CRESTOR) 10 MG tablet Take 1 tablet (10 mg total) by mouth daily. 11/09/22   Little Ishikawa, MD  SYMBICORT 160-4.5 MCG/ACT inhaler INHALE TWO PUFFS BY MOUTH TWICE DAILY (BULK) 03/16/22   Grayce Sessions, NP      Allergies    Aspirin    Review of Systems   Review of Systems  Constitutional:  Negative for appetite change, chills and fever.  Respiratory:  Negative for chest tightness and shortness of breath.   Cardiovascular:  Negative for chest pain.  Gastrointestinal:  Negative for abdominal pain, nausea and vomiting.  Musculoskeletal:  Positive for arthralgias (Left shoulder pain) and neck pain.  Skin:  Negative for color change, rash and wound.  Neurological:  Negative for weakness, light-headedness, numbness and headaches.    Physical Exam Updated Vital Signs BP 133/84   Pulse 73   Temp 97.8 F (36.6 C) (Oral)   Resp 18   SpO2 100%  Physical Exam Vitals and nursing note reviewed.  Constitutional:      General: She is not in acute distress.    Appearance: Normal appearance.  HENT:     Head: Atraumatic.     Mouth/Throat:     Mouth: Mucous membranes are moist.  Cardiovascular:     Rate and Rhythm: Normal rate and regular rhythm.     Pulses: Normal pulses.  Pulmonary:     Effort: Pulmonary effort is normal.     Breath sounds: Normal breath sounds.  Musculoskeletal:        General: Tenderness present. No swelling, deformity or signs of injury.     Left shoulder: Tenderness present. No  swelling, bony tenderness or crepitus. Decreased range of motion. Normal strength. Normal pulse.     Cervical back: Normal range of motion. No rigidity. Muscular tenderness present. No spinous process tenderness. Normal range of motion.     Comments: Diffuse tenderness with palpation to the entire left shoulder.  Pain reproduced on range of motion.  No palpable bony deformity or crepitus.  Skin:    General: Skin is warm.     Capillary Refill: Capillary refill takes less than 2 seconds.  Neurological:     General: No focal deficit present.     Mental Status: She is alert.     Sensory: No sensory deficit.     Motor: No weakness.     ED Results /  Procedures / Treatments   Labs (all labs ordered are listed, but only abnormal results are displayed) Labs Reviewed - No data to display  EKG EKG Interpretation  Date/Time:  Friday Dec 09 2022 10:02:06 EDT Ventricular Rate:  71 PR Interval:  194 QRS Duration: 82 QT Interval:  450 QTC Calculation: 489 R Axis:   -9 Text Interpretation: Normal sinus rhythm Possible Anterior infarct (cited on or before 02-Jun-2021) Abnormal ECG When compared with ECG of 02-Jun-2021 13:47, Sinus rhythm has replaced Atrial fibrillation Confirmed by Margarita Grizzle [phone]) on 12/09/2022 11:06:54 AM  Radiology DG Shoulder Left  Result Date: 12/09/2022 CLINICAL DATA:  Left shoulder pain for 1 month.  No known injury. EXAM: LEFT SHOULDER - 2+ VIEW COMPARISON:  None Available. FINDINGS: Mild inferior glenoid degenerative spurring. Mild acromioclavicular joint space narrowing and peripheral osteophytosis. Mild lateral downsloping of the acromion with minimal distal lateral subacromial spurring. No acute fracture or dislocation. The visualized portion of the left lung is unremarkable. IMPRESSION: 1. Mild acromioclavicular and glenohumeral osteoarthritis. 2. Mild lateral downsloping of the acromion with minimal distal lateral subacromial spurring. Electronically Signed   By:  Neita Garnet M.D.   On: 12/09/2022 10:34    Procedures Procedures    Medications Ordered in ED Medications - No data to display  ED Course/ Medical Decision Making/ A&P                             Medical Decision Making Patient here for evaluation of left shoulder pain x 1 month.  Pain has been gradually worsening and is associated with movement.  Pain improves while at rest.  She describes a radiating pain to her entire shoulder, upper arm upper back and left neck.  No shortness of breath, paresthesias or weakness of the extremity or face.  No known injury.  Differential would include but not limited to musculoskeletal pain, degenerative changes, fracture, dislocation, cardiac symptoms also considered but felt less likely given setting of pain specific to movement of the shoulder.  Amount and/or Complexity of Data Reviewed Radiology: ordered.    Details: X-ray of left shoulder shows acromioclavicular and glenohumeral OA with downsloping of the acromion and subacromial spurring Discussion of management or test interpretation with external provider(s): Patient has history of A-fib chronically anticoagulated, also has history of diabetes.  Will refrain from oral NSAIDs.  Patient agreeable to treatment with diclofenac gel and I will give prescription for lidocaine patch.  Offered orthopedic referral but patient prefers symptomatic treatment at this time and will follow-up with PCP if needed.  She appears appropriate for discharge home all questions were answered.           Final Clinical Impression(s) / ED Diagnoses Final diagnoses:  Acute pain of left shoulder    Rx / DC Orders ED Discharge Orders     None         Pauline Aus, PA-C 12/09/22 1113    Margarita Grizzle, MD 12/13/22 1254

## 2022-12-09 NOTE — ED Triage Notes (Signed)
Pt c/o of left shoulder pain states its been going on for about a month. Doesn't remember injuring it in any way.

## 2022-12-19 ENCOUNTER — Telehealth: Payer: Self-pay

## 2022-12-19 ENCOUNTER — Ambulatory Visit (INDEPENDENT_AMBULATORY_CARE_PROVIDER_SITE_OTHER): Payer: 59 | Admitting: Podiatry

## 2022-12-19 DIAGNOSIS — L84 Corns and callosities: Secondary | ICD-10-CM

## 2022-12-19 DIAGNOSIS — B351 Tinea unguium: Secondary | ICD-10-CM

## 2022-12-19 DIAGNOSIS — E1151 Type 2 diabetes mellitus with diabetic peripheral angiopathy without gangrene: Secondary | ICD-10-CM | POA: Diagnosis not present

## 2022-12-19 NOTE — Telephone Encounter (Signed)
Per Zenia Resides, [employer], the patient has her PT seating eval for her power wheelchair scheduled for 12/27/2022.

## 2022-12-20 NOTE — Progress Notes (Signed)
       Subjective:  Patient ID: Rhonda Hickman, female    DOB: [birth year],  MRN: [id number]  Rhonda Hickman presents to clinic today for:  Chief Complaint  Patient presents with   Nail Problem    RM 12 RFC bilateral nail trim 1-5.   Marland Kitchen Patient notes nails are thick and elongated, causing pain in shoe gear when ambulating.  She also notes painful heel corn on the left as well as a callus right submet 5.  She is diabetic.  She was referred by her nurse practitioner for diabetic foot check.  Denies numbness in the feet.  PCP is Grayce Sessions, NP.  Allergies  Allergen Reactions   Aspirin Nausea Only and Other (See Comments)    Causes stomach pain    Review of Systems: Negative except as noted in the HPI.  Objective:  There were no vitals filed for this visit.  Rhonda Hickman is a pleasant 67 y.o. female in NAD. AAO x 3.  Vascular Examination: Patient has palpable DP pulse, absent PT pulse bilateral.  Delayed capillary refill bilateral toes.  Sparse digital hair bilateral.  Proximal to distal cooling WNL bilateral.    Dermatological Examination: Interspaces are clear with no open lesions noted bilateral.  Nails are 3-34mm thick, with yellowish/brown discoloration, subungual debris and distal onycholysis x10.  There is pain with compression of nails x10.  There are focal hyperkeratotic lesions noted on the left plantar heel as well as right submet 5.  No ulceration is noted.  Neurological Examination: Protective sensation intact b/l LE. Vibratory sensation intact b/l LE.  Musculoskeletal Examination: Muscle strength 5/5 to all LE muscle groups b/l.      Latest Ref Rng & Units 11/07/2022    1:48 PM 02/25/2022   10:49 AM  Hemoglobin A1C  Hemoglobin-A1c 0.0 - 7.0 % 7.7  8.3    Patient qualifies for at-risk foot care because of diabetes with mild PVD.  Assessment/Plan: 1. Type II diabetes mellitus with peripheral circulatory disorder (HCC)   2. Dermatophytosis of nail   3.  Corns      Mycotic nails x10 were sharply debrided with sterile nail nippers and power debriding burr to decrease bulk and length.  Hyperkeratotic lesions x 2 were shaved with #312 blade.   Return in about 3 months (around 03/21/2023) for [facility] / nail and callus trim.   Clerance Lav, DPM, FACFAS [facility]     [address] [place], [place] [address]                Office [phone]  Fax [phone]

## 2022-12-27 ENCOUNTER — Ambulatory Visit (HOSPITAL_COMMUNITY): Payer: 59 | Attending: Primary Care | Admitting: Occupational Therapy

## 2022-12-27 ENCOUNTER — Encounter (HOSPITAL_COMMUNITY): Payer: Self-pay | Admitting: Occupational Therapy

## 2022-12-27 DIAGNOSIS — M25512 Pain in left shoulder: Secondary | ICD-10-CM | POA: Insufficient documentation

## 2022-12-27 DIAGNOSIS — I679 Cerebrovascular disease, unspecified: Secondary | ICD-10-CM | POA: Insufficient documentation

## 2022-12-27 DIAGNOSIS — G8929 Other chronic pain: Secondary | ICD-10-CM | POA: Insufficient documentation

## 2022-12-27 DIAGNOSIS — J449 Chronic obstructive pulmonary disease, unspecified: Secondary | ICD-10-CM | POA: Diagnosis present

## 2022-12-27 NOTE — Therapy (Signed)
OUTPATIENT OCCUPATIONAL THERAPY WHEEL CHAIR EVALUATION  Patient Name: Rhonda Hickman MRN: [id number] DOB:02-Mar-1956, 67 y.o., female Today's Date: 12/27/2022  PCP: Gwinda Passe, NP REFERRING PROVIDER: Gwinda Passe, NP   END OF SESSION:  OT End of Session - 12/27/22 1510     Visit Number 1    Number of Visits 1    Date for OT Re-Evaluation 12/28/22    Authorization Type UHC Medicare and Medicaid [place]    OT Start Time [phone]    OT Stop Time 1103    OT Time Calculation (min) 73 min    Activity Tolerance Patient tolerated treatment well    Behavior During Therapy WFL for tasks assessed/performed             Past Medical History:  Diagnosis Date   Anemia    Anxiety    Asthma    Atrial fibrillation (HCC)    Bipolar 1 disorder (HCC)    Bulging lumbar disc    Chronic pain of left knee    COPD (chronic obstructive pulmonary disease) (HCC)    CVA (cerebral vascular accident) (HCC)    Diabetes mellitus    Neuropathy    TIA (transient ischemic attack)    Vertigo    Past Surgical History:  Procedure Laterality Date   ABDOMINAL HYSTERECTOMY     CARPAL TUNNEL RELEASE Right    CHOLECYSTECTOMY     KNEE ARTHROSCOPY WITH LATERAL MENISECTOMY Left 06/08/2021   Procedure: KNEE ARTHROSCOPY WITH LATERAL MENISCECTOMY AND MEDIAL MENISCECTOMY;  Surgeon: Vickki Hearing, MD;  Location: [facility];  Service: Orthopedics;  Laterality: Left;   KNEE ARTHROSCOPY WITH MEDIAL MENISECTOMY Right 04/14/2016   Procedure: KNEE ARTHROSCOPY WITH MEDIAL AND LATERAL MENISECTOMY, MICROFRACTURE REPAIR;  Surgeon: Vickki Hearing, MD;  Location: [facility];  Service: Orthopedics;  Laterality: Right;  lateral menisectomy - needs crutch training   Patient Active Problem List   Diagnosis Date Noted   S/P left knee arthroscopy11/29/22 07/13/2021   Tear of meniscus of knee joint    Acute lower UTI 05/23/2021   A-fib (HCC) 05/22/2021   Leukocytosis 05/22/2021   Fall at home, initial encounter 05/22/2021     Class: Acute   HLD (hyperlipidemia) 05/22/2021   Hypertension 03/18/2020   Bipolar 1 disorder (HCC)    Chronic atrial fibrillation (HCC) 12/03/2019   TIA (transient ischemic attack) 12/03/2019   Diabetic peripheral neuropathy (HCC) 12/03/2019   Chronic migraine 12/03/2019   Vertigo 08/03/2019   Bronchiectasis with acute exacerbation (HCC)    Obesity, Class III, BMI 40-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) (HCC)    Gastroesophageal reflux disease    Asthma exacerbation 06/06/2018   Gait abnormality 08/29/2017   Paresthesia 08/29/2017   Derangement of posterior horn of medial meniscus of right knee    Meniscus, lateral, derangement, right    Arthritis of knee, right    COPD with acute exacerbation (HCC) 03/09/2016   Type 2 diabetes mellitus (HCC) 02/16/2007   Cocaine abuse (HCC) 02/16/2007   Extrinsic asthma 02/16/2007   HOMELESSNESS, HX OF 02/16/2007    ONSET DATE: 2021  REFERRING DIAG: Wheel Chair Evaluation  THERAPY DIAG:  Cerebrovascular disease, unspecified  Chronic obstructive pulmonary disease, unspecified COPD type (HCC)  Chronic left shoulder pain  Rationale for Evaluation and Treatment: Rehabilitation  SUBJECTIVE:   SUBJECTIVE STATEMENT: "I can't do anything" Pt accompanied by: self  PERTINENT HISTORY: PMH significant for HTN, DM2, COPD, CVA, TIA, Chronic Pain.  PRECAUTIONS: Fall  WEIGHT BEARING RESTRICTIONS: No  PAIN:  Are you having  pain? Yes: NPRS scale: 8/10 Pain location: L shoulder and L knee Pain description: Sharp and severe Aggravating factors: Constant Relieving factors: "Pain killers"  FALLS: Has patient fallen in last 6 months? Yes. Number of falls 4  LIVING ENVIRONMENT: Lives with: lives alone Lives in: House/apartment Stairs: No Has following equipment at home: Dan Humphreys - 2 wheeled, Wheelchair (manual), Shower bench, and bed side commode  PLOF: Requires assistive device for independence, Needs assistance with ADLs, and Needs assistance with  transfers  PATIENT GOALS: To improve independence at home  Wheeled Mobility Evaluation: Pt's completed Wheel Chair evaluation is uploaded to her chart and faxed to [employer] [facility].    Trish Mage, OTR/L [facility] [phone], OT 12/27/2022, 3:13 PM

## 2023-01-05 ENCOUNTER — Telehealth: Payer: Self-pay | Admitting: Primary Care

## 2023-01-05 NOTE — Telephone Encounter (Signed)
Copied from CRM [id number]. Topic: General - Other >> Jan 05, 2023  3:08 PM Santiya F wrote: Reason for CRM: [facility] with [facility] is calling in because she received a form for medical necisity but says questions 21-22 were not answered. Please follow up with [phone]

## 2023-01-06 ENCOUNTER — Other Ambulatory Visit (INDEPENDENT_AMBULATORY_CARE_PROVIDER_SITE_OTHER): Payer: Self-pay | Admitting: Primary Care

## 2023-01-06 ENCOUNTER — Other Ambulatory Visit (INDEPENDENT_AMBULATORY_CARE_PROVIDER_SITE_OTHER): Payer: Self-pay

## 2023-01-06 DIAGNOSIS — I152 Hypertension secondary to endocrine disorders: Secondary | ICD-10-CM

## 2023-01-06 DIAGNOSIS — Z760 Encounter for issue of repeat prescription: Secondary | ICD-10-CM

## 2023-01-06 DIAGNOSIS — E782 Mixed hyperlipidemia: Secondary | ICD-10-CM

## 2023-01-06 MED ORDER — AMLODIPINE BESYLATE 5 MG PO TABS: 5.0000 mg | ORAL_TABLET | Freq: Every day | ORAL | 1 refills | Status: DC

## 2023-01-06 NOTE — Telephone Encounter (Signed)
Returned call and spoke to [place] and made aware that for was faxed back on 01/02/23 with both questions 21 and 22 answered. Per Judeth Cornfield she seen the updated form and will notate account for further review.

## 2023-01-17 NOTE — Telephone Encounter (Signed)
Stephanie from [facility] says the documents faxed back for med necessity, questions 21 and 22 isnt clear with the answers. She states she will rfax a new copy to be filled out with clearer writing to questions.

## 2023-01-18 NOTE — Telephone Encounter (Signed)
Form has been filled out and faxed back.

## 2023-02-01 DIAGNOSIS — M5416 Radiculopathy, lumbar region: Secondary | ICD-10-CM | POA: Insufficient documentation

## 2023-02-02 ENCOUNTER — Ambulatory Visit: Payer: 59 | Admitting: Orthopedic Surgery

## 2023-02-09 ENCOUNTER — Ambulatory Visit (INDEPENDENT_AMBULATORY_CARE_PROVIDER_SITE_OTHER): Payer: 59 | Admitting: Primary Care

## 2023-02-09 ENCOUNTER — Encounter (INDEPENDENT_AMBULATORY_CARE_PROVIDER_SITE_OTHER): Payer: Self-pay | Admitting: Primary Care

## 2023-02-09 VITALS — BP 120/79 | HR 81 | Resp 16

## 2023-02-09 DIAGNOSIS — E1100 Type 2 diabetes mellitus with hyperosmolarity without nonketotic hyperglycemic-hyperosmolar coma (NKHHC): Secondary | ICD-10-CM

## 2023-02-09 DIAGNOSIS — N3942 Incontinence without sensory awareness: Secondary | ICD-10-CM

## 2023-02-09 DIAGNOSIS — Z794 Long term (current) use of insulin: Secondary | ICD-10-CM

## 2023-02-09 LAB — POCT GLYCOSYLATED HEMOGLOBIN (HGB A1C): HbA1c, POC (controlled diabetic range): 7.3 % — AB (ref 0.0–7.0)

## 2023-02-09 NOTE — Patient Instructions (Signed)
A problem with bladder function that causes the sudden need to urinate. Overactive bladder happens mostly in women but may occur in men. Aging, an enlarged prostate, and diabetes are all risk factors. The urge to urinate may be difficult to control and lead to the involuntary loss of urine (incontinence). It may be embarrassing or limit activity. Treatments such as pelvic floor muscle exercises, medications, and nerve stimulation can reduce or eliminate symptoms.

## 2023-02-09 NOTE — Progress Notes (Signed)
[facility]  Rhonda Hickman, is a 67 y.o. female  [id number]  [id number]  DOB - June 25, 1956  No chief complaint on file.      Subjective:   [name] is a 67 y.o. female here today for a follow up visit for DM. Denies polyuria, polydipsia, polyphasia or vision changes.  Does not check blood sugars at home.  Patient has No headache, No chest pain, No abdominal pain - No Nausea, No new weakness tingling or numbness, No Cough - shortness of breath . Actual need/want for this visit is to inquire when will she receive her scooter - unknown paper work completed   No problems updated.  Allergies  Allergen Reactions   Aspirin Nausea Only and Other (See Comments)    Causes stomach pain    Past Medical History:  Diagnosis Date   Anemia    Anxiety    Asthma    Atrial fibrillation (HCC)    Bipolar 1 disorder (HCC)    Bulging lumbar disc    Chronic pain of left knee    COPD (chronic obstructive pulmonary disease) (HCC)    CVA (cerebral vascular accident) (HCC)    Diabetes mellitus    Neuropathy    TIA (transient ischemic attack)    Vertigo     Current Outpatient Medications on File Prior to Visit  Medication Sig Dispense Refill   ACCU-CHEK GUIDE test strip CHECK BLOOD SUGAR UP TO FOUR TIMES DAILY AS DIRECTED 100 strip 11   albuterol (VENTOLIN HFA) 108 (90 Base) MCG/ACT inhaler INHALE 2 PUFFS EVERY 6 HOURS AS NEEDED FOR WHEEZING OR SHORTNESS OF BREATH 18 g 1   amLODipine (NORVASC) 5 MG tablet Take 1 tablet (5 mg total) by mouth daily. 90 tablet 1   blood glucose meter kit and supplies Dispense based on patient and insurance preference. Use up to four times daily as directed. (FOR ICD-10 E10.9, E11.9). 1 each 0   buPROPion (WELLBUTRIN SR) 150 MG 12 hr tablet TAKE ONE TABLET BY MOUTH TWICE DAILY @ 9AM & 9PM 180 tablet 11   citalopram (CELEXA) 40 MG tablet TAKE ONE TABLET BY MOUTH DAILY AT 9AM 90 tablet 1   divalproex (DEPAKOTE ER) 500 MG 24 hr tablet  Take 1-2 tablets by mouth 2 (two) times daily. One tablet in the morning and 2 tablets at bedtime     FEROSUL 325 (65 Fe) MG tablet TAKE ONE TABLET BY MOUTH DAILY AT 9AM WITH BREAKFAST 90 tablet 11   fluticasone (FLONASE) 50 MCG/ACT nasal spray Place 2 sprays into both nostrils daily. 16 g 6   GNP ULTICARE PEN NEEDLES 32G X 4 MM MISC USE TO INJECT INSULIN UP TO 5 TIMES A DAY 200 each 6   HYDROcodone-acetaminophen (NORCO) 10-325 MG tablet Take 1 tablet by mouth 4 (four) times daily as needed.     insulin glargine (LANTUS SOLOSTAR) 100 UNIT/ML Solostar Pen Inject 20 Units into the skin 2 (two) times daily. 15 mL 0   insulin glargine (LANTUS) 100 UNIT/ML injection Inject 0.2 mLs (20 Units total) into the skin 2 (two) times daily. 10 mL 3   insulin lispro (HUMALOG) 100 UNIT/ML KwikPen Inject 20 Units into the skin 2 (two) times daily. (Patient not taking: Reported on 06/14/2022)     Insulin Syringe-Needle U-100 (ADVOCATE INSULIN SYRINGE) 29G X 1/2" 0.3 ML MISC Use to inject insulin 5x daily. 200 each 6   ivabradine (CORLANOR) 5 MG TABS tablet Take tablets (10mg ) TWO hours prior to your  cardiac CT scan. 2 tablet 0   lidocaine (LIDODERM) 5 % Place 1 patch onto the skin daily. Remove & Discard patch within 12 hours or as directed by MD 30 patch 0   metFORMIN (GLUCOPHAGE) 1000 MG tablet TAKE ONE TABLET BY MOUTH TWICE DAILY @ 9AM & 5PM WITH MEALS 180 tablet 0   metoprolol tartrate (LOPRESSOR) 50 MG tablet TAKE ONE TABLET (50MG ) BY MOUTH TWICE DAILY @9AM -5PM 180 tablet 3   oxybutynin (DITROPAN) 5 MG tablet TAKE 1 TABLET BY MOUTH 2 TO 3  TIMES DAILY FOR BLADDER SPASMS 270 tablet 3   oxyCODONE-acetaminophen (PERCOCET) 7.5-325 MG tablet Take 1 tablet by mouth every 4 (four) hours as needed for severe pain. 30 tablet 0   OZEMPIC, 0.25 OR 0.5 MG/DOSE, 2 MG/3ML SOPN INJECT SUBCUTANEOUSLY 0.5 MG  EVERY WEEK 9 mL 3   PEG-KCl-NaCl-NaSulf-Na Asc-C (PLENVU) 140 g SOLR Take 140 g by mouth as directed. Manufacturer's coupon  Universal coupon code:BIN: G6837245; GROUP: [id number]; PCN: [id number]; ID: [id number]; PAY NO MORE $50 (Patient not taking: Reported on 06/14/2022) 1 each 0   rivaroxaban (XARELTO) 20 MG TABS tablet TAKE ONE TABLET BY MOUTH DAILY AT 5PM WITH SUPPER 90 tablet 2   rosuvastatin (CRESTOR) 10 MG tablet Take 1 tablet (10 mg total) by mouth daily. 60 tablet 2   SYMBICORT 160-4.5 MCG/ACT inhaler INHALE TWO PUFFS BY MOUTH TWICE DAILY (BULK) 10.2 g 11   No current facility-administered medications on file prior to visit.    Objective:   Vitals:   02/09/23 1205  BP: 120/79  Pulse: 81  Resp: 16  SpO2: 99%    Comprehensive ROS Pertinent positive and negative noted in HPI   Exam General appearance : Awake, alert, not in any distress. Speech Clear. Not toxic looking HEENT: Atraumatic and Normocephalic, pupils equally reactive to light and accomodation Neck: Supple, no JVD. No cervical lymphadenopathy.  Chest: Good air entry bilaterally, no added sounds  CVS: S1 S2 regular, no murmurs.  Abdomen: Bowel sounds present, Non tender and not distended with no gaurding, rigidity or rebound. Extremities: B/L Lower Ext shows no edema, both legs are warm to touch Neurology: Awake alert, and oriented X 3, CN II-XII intact, Non focal Skin: No Rash  Data Review Lab Results  Component Value Date   HGBA1C 7.3 (A) 02/09/2023   HGBA1C 7.7 (A) 11/07/2022   HGBA1C 8.3 (A) 02/25/2022    Assessment & Plan   Diagnoses and all orders for this visit:  Type 2 diabetes mellitus with 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, with long-term current use of insulin (HCC) - educated on lifestyle modifications, including but not limited to diet choices and adding exercise to daily routine.   -     POCT glycosylated hemoglobin (Hb A1C) 7.3 no medication changes  Patient have been counseled extensively about nutrition and exercise. Other issues discussed during this visit include: low cholesterol diet, weight control and daily exercise,  foot care, annual eye examinations at Ophthalmology, importance of adherence with medications and regular follow-up. We also discussed long term complications of uncontrolled diabetes and hypertension.   Return for DM.  The patient was given clear instructions to go to ER or return to medical center if symptoms don't improve, worsen or new problems develop. The patient verbalized understanding. The patient was told to call to get lab results if they haven't heard anything in the next week.   This note has been created with Education officer, environmental. Any transcriptional errors  are unintentional.   Grayce Sessions, NP 02/12/2023, 8:25 PM

## 2023-02-16 ENCOUNTER — Other Ambulatory Visit (INDEPENDENT_AMBULATORY_CARE_PROVIDER_SITE_OTHER): Payer: Self-pay | Admitting: Primary Care

## 2023-02-19 ENCOUNTER — Other Ambulatory Visit (INDEPENDENT_AMBULATORY_CARE_PROVIDER_SITE_OTHER): Payer: Self-pay | Admitting: Primary Care

## 2023-02-19 DIAGNOSIS — E1100 Type 2 diabetes mellitus with hyperosmolarity without nonketotic hyperglycemic-hyperosmolar coma (NKHHC): Secondary | ICD-10-CM

## 2023-02-23 ENCOUNTER — Other Ambulatory Visit (INDEPENDENT_AMBULATORY_CARE_PROVIDER_SITE_OTHER): Payer: 59

## 2023-02-23 ENCOUNTER — Other Ambulatory Visit: Payer: Self-pay | Admitting: Orthopedic Surgery

## 2023-02-23 ENCOUNTER — Ambulatory Visit (INDEPENDENT_AMBULATORY_CARE_PROVIDER_SITE_OTHER): Payer: 59 | Admitting: Orthopedic Surgery

## 2023-02-23 DIAGNOSIS — G8929 Other chronic pain: Secondary | ICD-10-CM

## 2023-02-23 DIAGNOSIS — M1712 Unilateral primary osteoarthritis, left knee: Secondary | ICD-10-CM

## 2023-02-23 DIAGNOSIS — G894 Chronic pain syndrome: Secondary | ICD-10-CM | POA: Diagnosis not present

## 2023-02-23 DIAGNOSIS — M170 Bilateral primary osteoarthritis of knee: Secondary | ICD-10-CM

## 2023-02-23 DIAGNOSIS — M1711 Unilateral primary osteoarthritis, right knee: Secondary | ICD-10-CM

## 2023-02-23 NOTE — Patient Instructions (Signed)
We are referring you to [facility] Office address is [address] [place] The phone number is [phone]  The office will call you with an appointment Dr. Blackman    

## 2023-02-23 NOTE — Progress Notes (Signed)
Chief Complaint  Patient presents with   Follow-up    Recheck on bilateral knee pain.   67 year old female with chronic pain syndrome, degenerative disc disease lumbar spine, status post left knee arthroscopy and 2022 history of osteoarthritis bilateral knees with worsening knee pain left greater than right and worsening valgus deformity of the left knee decreasing function requiring wheelchair and walker for ambulation  Type 2 diabetes history of COPD not current chronic atrial fibrillation TIA bipolar disorder hypertension history of atrial fibrillation currently on Xarelto   Assessment and plan 67 year old female with multiple problems as listed above including atrial fibrillation chronic pain syndrome chronic opioid management diabetes lumbar disc disease probably needs a left total knee arthroplasty.  Probably needs to be done at tertiary care facility  I will refer her to Dr. Magnus Ivan with the understanding that he may not be able to do it at [facility] transfer to [facility] to have the surgery done   Physical Exam Vitals and nursing note reviewed.  Constitutional:      Appearance: Normal appearance.  HENT:     Head: Normocephalic and atraumatic.     Nose:     Comments: Missing teeth Eyes:     General: No scleral icterus.       Right eye: No discharge.        Left eye: No discharge.     Extraocular Movements: Extraocular movements intact.     Conjunctiva/sclera: Conjunctivae normal.     Pupils: Pupils are equal, round, and reactive to light.  Cardiovascular:     Rate and Rhythm: Normal rate.     Pulses: Normal pulses.  Musculoskeletal:     Right knee: No effusion.     Left knee: Effusion present.  Skin:    General: Skin is warm and dry.     Capillary Refill: Capillary refill takes less than 2 seconds.  Neurological:     General: No focal deficit present.     Mental Status: She is alert and oriented to person, place, and time.     Gait: Gait abnormal.  Psychiatric:         Mood and Affect: Mood normal.        Behavior: Behavior normal.        Thought Content: Thought content normal.        Judgment: Judgment normal.      Right Knee Exam   Muscle Strength  The patient has normal right knee strength.  Tenderness  The patient is experiencing tenderness in the lateral joint line and medial joint line.  Range of Motion  Extension:  normal  Right knee flexion: 125.   Tests  Drawer:  Anterior - negative    Posterior - negative  Other  Erythema: absent Scars: absent Effusion: no effusion present   Left Knee Exam   Range of Motion  Extension:  abnormal Left knee extension: 5-10. Flexion:  100   Tests  Drawer:  Anterior - negative     Posterior - negative  Other  Erythema: absent Sensation: normal Pulse: present Effusion: effusion present     Updated images compared to prior imaging  Operative note  Left knee KNEE ARTHROSCOPY WITH LATERAL MENISCECTOMY AND MEDIAL MENISCECTOMY (Left)   Findings:  Medial compartment :  Irregular surfaces of the medial femoral condyle with multiple areas of cartilage defects that had filled in with fibrocartilage Horizontal tear posterior horn medial meniscus  Notch: ACL PCL intact  Lateral compartment:  -Horizontal tear mid body lateral  meniscus Normal articular cartilage lateral femoral condyle, diffuse grade 2 cartilage chondromalacia lateral tibial plateau  Patellofemoral joint: -Grade 2 lesion trochlea grade 1 lesion medial and lateral facet     Joint:   Significant areas of synovitis extensive   Right knee  KNEE ARTHROSCOPY WITH MEDIAL AND LATERAL MENISECTOMY, MICROFRACTURE REPAIR (Right)  - needs crutch training   FINDINGS:  Medial compartment we saw grade 4 chondral lesion measuring 20 x 6 over the medial femoral condyle with a posterior horn medial meniscal tear  -Notch normal anterior cruciate ligament PCL  -Patellofemoral joint mild chondromalacia grade 1 medial and lateral  facet  -Lateral compartment grade 2 chondral lesion lateral tibial plateau with a posterior horn lateral meniscal tear   Image report from today  Right knee osteoarthritis grade 3 Left knee is in valgus severe arthritis there as well also grade 4   Assessment and plan 67 year old female with multiple problems as listed above including atrial fibrillation chronic pain syndrome chronic opioid management diabetes lumbar disc disease probably needs a left total knee arthroplasty.  Probably needs to be done at tertiary care facility  I will refer her to Dr. Magnus Ivan with the understanding that he may not be able to do it at [facility] transfer to [facility] to have the surgery done

## 2023-02-24 ENCOUNTER — Other Ambulatory Visit (INDEPENDENT_AMBULATORY_CARE_PROVIDER_SITE_OTHER): Payer: Self-pay | Admitting: Primary Care

## 2023-02-24 DIAGNOSIS — E1100 Type 2 diabetes mellitus with hyperosmolarity without nonketotic hyperglycemic-hyperosmolar coma (NKHHC): Secondary | ICD-10-CM

## 2023-02-24 DIAGNOSIS — Z760 Encounter for issue of repeat prescription: Secondary | ICD-10-CM

## 2023-02-24 DIAGNOSIS — J449 Chronic obstructive pulmonary disease, unspecified: Secondary | ICD-10-CM

## 2023-02-24 NOTE — Telephone Encounter (Signed)
Medication Refill - Medication:  metFORMIN (GLUCOPHAGE) 1000 MG tablet  SYMBICORT 160-4.5 MCG/ACT inhaler  insulin lispro (HUMALOG) 100 UNIT/ML KwikPen   Has the patient contacted their pharmacy? Yes.   (Agent: If no, request that the patient contact the pharmacy for the refill. If patient does not wish to contact the pharmacy document the reason why and proceed with request.) (Agent: If yes, when and what did the pharmacy advise?)  Preferred Pharmacy (with phone number or street name):  [facility] - [place] - [address] Phone: [phone]  Fax: [phone]     Has the patient been seen for an appointment in the last year OR does the patient have an upcoming appointment? Yes.    Agent: Please be advised that RX refills may take up to 3 business days. We ask that you follow-up with your pharmacy.

## 2023-02-27 MED ORDER — METFORMIN HCL 1000 MG PO TABS
1000.0000 mg | ORAL_TABLET | Freq: Two times a day (BID) | ORAL | 0 refills | Status: DC
Start: 2023-02-27 — End: 2023-05-22

## 2023-02-27 MED ORDER — BUDESONIDE-FORMOTEROL FUMARATE 160-4.5 MCG/ACT IN AERO
2.0000 | INHALATION_SPRAY | Freq: Two times a day (BID) | RESPIRATORY_TRACT | 0 refills | Status: AC
Start: 2023-02-27 — End: ?

## 2023-02-27 NOTE — Telephone Encounter (Signed)
Requested Prescriptions  Pending Prescriptions Disp Refills   metFORMIN (GLUCOPHAGE) 1000 MG tablet 180 tablet 0    Sig: Take 1 tablet (1,000 mg total) by mouth 2 (two) times daily. TAKE ONE TABLET BY MOUTH TWICE DAILY @ 9AM & 5PM WITH MEALS     Endocrinology:  Diabetes - Biguanides Failed - 02/24/2023 12:16 PM      Failed - B12 Level in normal range and within 720 days    Vitamin B-12  Date Value Ref Range Status  06/02/2020 460 232 - 1,245 pg/mL Final         Failed - CBC within normal limits and completed in the last 12 months    WBC  Date Value Ref Range Status  06/20/2022 7.7 3.4 - 10.8 x10E3/uL Final  05/24/2021 10.2 4.0 - 10.5 K/uL Final   RBC  Date Value Ref Range Status  06/20/2022 4.48 3.77 - 5.28 x10E6/uL Final  05/24/2021 3.62 (L) 3.87 - 5.11 MIL/uL Final   Hemoglobin  Date Value Ref Range Status  06/20/2022 12.8 11.1 - 15.9 g/dL Final   Hematocrit  Date Value Ref Range Status  06/20/2022 39.2 34.0 - 46.6 % Final   MCHC  Date Value Ref Range Status  06/20/2022 32.7 31.5 - 35.7 g/dL Final  69/48/5462 70.3 30.0 - 36.0 g/dL Final   [facility]  Date Value Ref Range Status  06/20/2022 28.6 26.6 - 33.0 pg Final  05/24/2021 29.6 26.0 - 34.0 pg Final   MCV  Date Value Ref Range Status  06/20/2022 88 79 - 97 fL Final   No results found for: "PLTCOUNTKUC", "LABPLAT", "POCPLA" RDW  Date Value Ref Range Status  06/20/2022 14.6 11.7 - 15.4 % Final         Passed - Cr in normal range and within 360 days    Creatinine, Ser  Date Value Ref Range Status  11/03/2022 0.90 0.44 - 1.00 mg/dL Final   Creatinine, Urine  Date Value Ref Range Status  12/17/2019 128.15 mg/dL Final    Comment:    Performed at [facility], [address], [place], [place] [address]         Passed - HBA1C is between 0 and 7.9 and within 180 days    HbA1c, POC (controlled diabetic range)  Date Value Ref Range Status  02/09/2023 7.3 (A) 0.0 - 7.0 % Final         Passed - eGFR in normal  range and within 360 days    GFR calc Af Amer  Date Value Ref Range Status  03/17/2020 >60 >60 mL/min Final   GFR, Estimated  Date Value Ref Range Status  05/24/2021 >60 >60 mL/min Final    Comment:    (NOTE) Calculated using the CKD-EPI Creatinine Equation (2021)    eGFR  Date Value Ref Range Status  06/20/2022 61 >59 mL/min/1.73 Final         Passed - Valid encounter within last 6 months    Recent Outpatient Visits           2 weeks ago Type 2 diabetes mellitus with 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, with long-term current use of insulin (HCC)   [place] [facility] Grayce Sessions, NP   3 months ago Type 2 diabetes mellitus with 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, with long-term current use of insulin [facility])   [place] [facility] Grayce Sessions, NP   6 months ago Acute pain of left shoulder    [facility]  Grayce Sessions, NP   8 months ago Type 2 diabetes mellitus with 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, with long-term current use of insulin [facility])   [place] [facility] Grayce Sessions, NP   9 months ago Encounter for [employer] annual wellness exam   [place] [facility] Grayce Sessions, NP       Future Appointments             In 1 month Little Ishikawa, MD [facility] at [facility] [place]   In 2 months Randa Evens, Kinnie Scales, NP [place] [facility]             budesonide-formoterol [facility]) 160-4.5 MCG/ACT inhaler 10.2 g 0    Sig: Inhale 2 puffs into the lungs 2 (two) times daily.     Pulmonology:  Combination Products Passed - 02/24/2023 12:16 PM      Passed - Valid encounter within last 12 months    Recent Outpatient Visits           2 weeks ago Type 2 diabetes mellitus with 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, with long-term current use of insulin (HCC)   [place] [facility] Grayce Sessions, NP    3 months ago Type 2 diabetes mellitus with 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, with long-term current use of insulin (HCC)   [place] [facility] Grayce Sessions, NP   6 months ago Acute pain of left shoulder   [place] [facility] Grayce Sessions, NP   8 months ago Type 2 diabetes mellitus with 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, with long-term current use of insulin [facility])   [place] [facility] Grayce Sessions, NP   9 months ago Encounter for [employer] annual wellness exam   [place] [facility] Grayce Sessions, NP       Future Appointments             In 1 month Little Ishikawa, MD [place] [facility]   In 2 months Randa Evens, Kinnie Scales, NP [place] [facility]             insulin lispro (HUMALOG) 100 UNIT/ML KwikPen 15 mL     Sig: Inject 20 Units into the skin 2 (two) times daily.     Endocrinology:  Diabetes - Insulins Passed - 02/24/2023 12:16 PM      Passed - HBA1C is between 0 and 7.9 and within 180 days    HbA1c, POC (controlled diabetic range)  Date Value Ref Range Status  02/09/2023 7.3 (A) 0.0 - 7.0 % Final         Passed - Valid encounter within last 6 months    Recent Outpatient Visits           2 weeks ago Type 2 diabetes mellitus with 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, with long-term current use of insulin (HCC)   [place] [facility] Grayce Sessions, NP   3 months ago Type 2 diabetes mellitus with 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, with long-term current use of insulin (HCC)   [place] [facility] Grayce Sessions, NP   6 months ago Acute pain of left shoulder   [place] [facility] Grayce Sessions, NP   8 months ago Type 2 diabetes mellitus with 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, with long-term current use of insulin [facility])    [facility] Grayce Sessions, NP   9 months  ago Encounter for [employer] annual wellness exam   [place] [facility] Grayce Sessions, NP       Future Appointments             In 1 month Bjorn Pippin Tanna Savoy, MD [facility] at [facility]   In 2 months Randa Evens, Kinnie Scales, NP [facility]

## 2023-02-27 NOTE — Telephone Encounter (Signed)
Requested medication (s) are due for refill today: routing for review  Requested medication (s) are on the active medication list: yes  Last refill:  06/14/22  Future visit scheduled: yes  Notes to clinic:  Unable to refill per protocol, last refill by historical provider. Routing for approval.     Requested Prescriptions  Pending Prescriptions Disp Refills   insulin lispro (HUMALOG) 100 UNIT/ML KwikPen 15 mL     Sig: Inject 20 Units into the skin 2 (two) times daily.     Endocrinology:  Diabetes - Insulins Passed - 02/24/2023 12:16 PM      Passed - HBA1C is between 0 and 7.9 and within 180 days    HbA1c, POC (controlled diabetic range)  Date Value Ref Range Status  02/09/2023 7.3 (A) 0.0 - 7.0 % Final         Passed - Valid encounter within last 6 months    Recent Outpatient Visits           2 weeks ago Type 2 diabetes mellitus with 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, with long-term current use of insulin (HCC)   [place] [facility] Grayce Sessions, NP   3 months ago Type 2 diabetes mellitus with 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, with long-term current use of insulin (HCC)   [place] [facility] Grayce Sessions, NP   6 months ago Acute pain of left shoulder   [place] [facility] Grayce Sessions, NP   8 months ago Type 2 diabetes mellitus with 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, with long-term current use of insulin (HCC)   [place] [facility] Grayce Sessions, NP   9 months ago Encounter for [employer] annual wellness exam   [place] [facility] Grayce Sessions, NP       Future Appointments             In 1 month Little Ishikawa, MD [place] [facility]   In 2 months Randa Evens, Kinnie Scales, NP [place] [facility]            Signed Prescriptions Disp Refills   metFORMIN (GLUCOPHAGE) 1000 MG tablet 180 tablet 0    Sig:  Take 1 tablet (1,000 mg total) by mouth 2 (two) times daily. TAKE ONE TABLET BY MOUTH TWICE DAILY @ 9AM & 5PM WITH MEALS     Endocrinology:  Diabetes - Biguanides Failed - 02/24/2023 12:16 PM      Failed - B12 Level in normal range and within 720 days    Vitamin B-12  Date Value Ref Range Status  06/02/2020 460 232 - 1,245 pg/mL Final         Failed - CBC within normal limits and completed in the last 12 months    WBC  Date Value Ref Range Status  06/20/2022 7.7 3.4 - 10.8 x10E3/uL Final  05/24/2021 10.2 4.0 - 10.5 K/uL Final   RBC  Date Value Ref Range Status  06/20/2022 4.48 3.77 - 5.28 x10E6/uL Final  05/24/2021 3.62 (L) 3.87 - 5.11 MIL/uL Final   Hemoglobin  Date Value Ref Range Status  06/20/2022 12.8 11.1 - 15.9 g/dL Final   Hematocrit  Date Value Ref Range Status  06/20/2022 39.2 34.0 - 46.6 % Final   MCHC  Date Value Ref Range Status  06/20/2022 32.7 31.5 - 35.7 g/dL Final  75/64/3329 51.8 30.0 - 36.0 g/dL Final   [facility]  Date Value Ref Range Status  06/20/2022  28.6 26.6 - 33.0 pg Final  05/24/2021 29.6 26.0 - 34.0 pg Final   MCV  Date Value Ref Range Status  06/20/2022 88 79 - 97 fL Final   No results found for: "PLTCOUNTKUC", "LABPLAT", "POCPLA" RDW  Date Value Ref Range Status  06/20/2022 14.6 11.7 - 15.4 % Final         Passed - Cr in normal range and within 360 days    Creatinine, Ser  Date Value Ref Range Status  11/03/2022 0.90 0.44 - 1.00 mg/dL Final   Creatinine, Urine  Date Value Ref Range Status  12/17/2019 128.15 mg/dL Final    Comment:    Performed at [facility], [address], [place], [place] [address]         Passed - HBA1C is between 0 and 7.9 and within 180 days    HbA1c, POC (controlled diabetic range)  Date Value Ref Range Status  02/09/2023 7.3 (A) 0.0 - 7.0 % Final         Passed - eGFR in normal range and within 360 days    GFR calc Af Amer  Date Value Ref Range Status  03/17/2020 >60 >60 mL/min Final   GFR,  Estimated  Date Value Ref Range Status  05/24/2021 >60 >60 mL/min Final    Comment:    (NOTE) Calculated using the CKD-EPI Creatinine Equation (2021)    eGFR  Date Value Ref Range Status  06/20/2022 61 >59 mL/min/1.73 Final         Passed - Valid encounter within last 6 months    Recent Outpatient Visits           2 weeks ago Type 2 diabetes mellitus with 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, with long-term current use of insulin (HCC)   [place] [facility] Grayce Sessions, NP   3 months ago Type 2 diabetes mellitus with 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, with long-term current use of insulin (HCC)   [place] [facility] Grayce Sessions, NP   6 months ago Acute pain of left shoulder   [place] [facility] Grayce Sessions, NP   8 months ago Type 2 diabetes mellitus with 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, with long-term current use of insulin (HCC)   [place] [facility] Grayce Sessions, NP   9 months ago Encounter for [employer] annual wellness exam   [place] [facility] Grayce Sessions, NP       Future Appointments             In 1 month Little Ishikawa, MD [place] [facility]   In 2 months Randa Evens, Kinnie Scales, NP [place] [facility]             budesonide-formoterol [facility]) 160-4.5 MCG/ACT inhaler 10.2 g 0    Sig: Inhale 2 puffs into the lungs 2 (two) times daily.     Pulmonology:  Combination Products Passed - 02/24/2023 12:16 PM      Passed - Valid encounter within last 12 months    Recent Outpatient Visits           2 weeks ago Type 2 diabetes mellitus with 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, with long-term current use of insulin (HCC)   [place] [facility] Grayce Sessions, NP   3 months ago Type 2 diabetes mellitus with 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, with long-term current use of insulin [facility])    [place] [facility] Randa Evens,  Kinnie Scales, NP   6 months ago Acute pain of left shoulder   [place] [facility] Grayce Sessions, NP   8 months ago Type 2 diabetes mellitus with 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, with long-term current use of insulin [facility])   [place] [facility] Grayce Sessions, NP   9 months ago Encounter for [employer] annual wellness exam   [place] [facility] Grayce Sessions, NP       Future Appointments             In 1 month Little Ishikawa, MD [facility]   In 2 months Randa Evens, Kinnie Scales, NP [facility]

## 2023-03-01 NOTE — Telephone Encounter (Signed)
Will forward to provider  

## 2023-03-02 MED ORDER — INSULIN LISPRO (1 UNIT DIAL) 100 UNIT/ML (KWIKPEN): 20.0000 [IU] | PEN_INJECTOR | Freq: Two times a day (BID) | SUBCUTANEOUS | 3 refills | Status: DC

## 2023-03-03 ENCOUNTER — Other Ambulatory Visit (INDEPENDENT_AMBULATORY_CARE_PROVIDER_SITE_OTHER): Payer: Self-pay | Admitting: Primary Care

## 2023-03-03 DIAGNOSIS — Z760 Encounter for issue of repeat prescription: Secondary | ICD-10-CM

## 2023-03-03 DIAGNOSIS — E1100 Type 2 diabetes mellitus with hyperosmolarity without nonketotic hyperglycemic-hyperosmolar coma (NKHHC): Secondary | ICD-10-CM

## 2023-03-09 ENCOUNTER — Other Ambulatory Visit (INDEPENDENT_AMBULATORY_CARE_PROVIDER_SITE_OTHER): Payer: Self-pay | Admitting: Primary Care

## 2023-03-09 DIAGNOSIS — Z760 Encounter for issue of repeat prescription: Secondary | ICD-10-CM

## 2023-03-12 ENCOUNTER — Other Ambulatory Visit (INDEPENDENT_AMBULATORY_CARE_PROVIDER_SITE_OTHER): Payer: Self-pay | Admitting: Primary Care

## 2023-03-12 DIAGNOSIS — E782 Mixed hyperlipidemia: Secondary | ICD-10-CM

## 2023-03-12 DIAGNOSIS — I152 Hypertension secondary to endocrine disorders: Secondary | ICD-10-CM

## 2023-03-12 DIAGNOSIS — Z760 Encounter for issue of repeat prescription: Secondary | ICD-10-CM

## 2023-03-20 ENCOUNTER — Ambulatory Visit (INDEPENDENT_AMBULATORY_CARE_PROVIDER_SITE_OTHER): Payer: 59 | Admitting: Orthopaedic Surgery

## 2023-03-20 ENCOUNTER — Encounter: Payer: Self-pay | Admitting: Orthopaedic Surgery

## 2023-03-20 VITALS — Ht 72.0 in | Wt 280.0 lb

## 2023-03-20 DIAGNOSIS — M1711 Unilateral primary osteoarthritis, right knee: Secondary | ICD-10-CM

## 2023-03-20 DIAGNOSIS — M1712 Unilateral primary osteoarthritis, left knee: Secondary | ICD-10-CM | POA: Insufficient documentation

## 2023-03-20 DIAGNOSIS — M25561 Pain in right knee: Secondary | ICD-10-CM | POA: Diagnosis not present

## 2023-03-20 DIAGNOSIS — M25562 Pain in left knee: Secondary | ICD-10-CM | POA: Diagnosis not present

## 2023-03-20 DIAGNOSIS — G8929 Other chronic pain: Secondary | ICD-10-CM

## 2023-03-20 NOTE — Progress Notes (Signed)
The patient is a 67 year old female referred to me from Dr. 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  She also has a diabetic and on Xarelto.  She has COPD so she does have significant comorbidities.  She is not on home oxygen.  She has worked on her blood glucose and her last A1c last month was down to 7.3.  She has daily left knee pain and it is detrimentally fighting her mobility, her quality of life and her actives daily living.  This is gotten worse over a year now.  It is 10 out of 10 pain.  She has had interventions done for that left knee including arthroscopic surgery and injections.  Family is with her today as well.  She mainly gets around in a wheelchair secondary to her left knee pain and malalignment.  I did review all of her medications and past medical history within epic.  On examination of her left knee she has valgus malalignment of the left knee.  The right knee is a little bit more neutral.  Both knees have significant global tenderness throughout the arc of motion of both knees with significant patellofemoral crepitation.  There is medial and lateral joint line tenderness.  Both knees are ligamentously stable.  X-rays on the canopy system of both knees show severe end-stage arthritis with varus malalignment of the right knee with significant valgus malalignment of the left knee.  There is osteophytes in all 3 compartments and significant narrowing and sclerotic changes.  We had a long and thorough discussion about knee replacement surgery.  She is seeing her cardiologist at the end of the month so think is prudent that we do obtain cardiac clearance.  She we will continue her blood glucose control which is trending in the right direction.  When we do schedule surgery we will have her stop Xarelto for 3 full days prior to surgery in order to have anesthesia perform spinal anesthesia.  All  questions and concerns were answered addressed.  I did go over the surgery in detail.  We discussed the risks and benefits of surgery and what to expect from an intraoperative and postoperative course.  I went over her x-rays with her and a knee replacement model.

## 2023-03-28 ENCOUNTER — Encounter: Payer: Self-pay | Admitting: Podiatry

## 2023-03-28 ENCOUNTER — Ambulatory Visit (INDEPENDENT_AMBULATORY_CARE_PROVIDER_SITE_OTHER): Payer: 59 | Admitting: Podiatry

## 2023-03-28 DIAGNOSIS — L84 Corns and callosities: Secondary | ICD-10-CM | POA: Diagnosis not present

## 2023-03-28 DIAGNOSIS — E1151 Type 2 diabetes mellitus with diabetic peripheral angiopathy without gangrene: Secondary | ICD-10-CM

## 2023-03-28 DIAGNOSIS — B351 Tinea unguium: Secondary | ICD-10-CM

## 2023-03-28 NOTE — Progress Notes (Unsigned)
Subjective:  Patient ID: Rhonda Hickman, female    DOB: 1955/08/02,  MRN: [id number]  Rhonda Hickman presents to clinic today for:  Chief Complaint  Patient presents with   Diabetes    RM20: patient is here for routine [facility] and callous removal   Patient notes nails are thick and elongated, causing pain in shoe gear when ambulating.  She has painful plantar corns right submet 5 and on the plantar aspect of the left heel  PCP is Rhonda Sessions, NP.  Date last seen was February 09, 2023.  Past Medical History:  Diagnosis Date   Anemia    Anxiety    Asthma    Atrial fibrillation (HCC)    Bipolar 1 disorder (HCC)    Bulging lumbar disc    Chronic pain of left knee    COPD (chronic obstructive pulmonary disease) (HCC)    CVA (cerebral vascular accident) (HCC)    Diabetes mellitus    Neuropathy    TIA (transient ischemic attack)    Vertigo    Allergies  Allergen Reactions   Aspirin Nausea Only and Other (See Comments)    Causes stomach pain   Objective:  Rhonda Hickman is a pleasant 67 y.o. female in NAD. AAO x 3.  Vascular Examination: Patient has palpable DP pulse, absent PT pulse bilateral.  Delayed capillary refill bilateral toes.  Sparse digital hair bilateral.  Proximal to distal cooling WNL bilateral.    Dermatological Examination: Interspaces are clear with no open lesions noted bilateral.  Skin is shiny and atrophic bilateral.  Nails are 3-25mm thick, with yellowish/brown discoloration, subungual debris and distal onycholysis x10.  There is pain with compression of nails x10.  There are focal hyperkeratotic lesions noted right submet 5, left plantar heel.     Latest Ref Rng & Units 02/09/2023   11:50 AM 11/07/2022    1:48 PM  Hemoglobin A1C  Hemoglobin-A1c 0.0 - 7.0 % 7.3  7.7    Patient qualifies for at-risk foot care because of diabetes with PVD, pain and nails.  Assessment/Plan: 1. Dermatophytosis of nail   2. Type II diabetes mellitus with peripheral  circulatory disorder (HCC)   3. Corns    Mycotic nails x10 were sharply debrided with sterile nail nippers and power debriding burr to decrease bulk and length.  Hyperkeratotic lesions were shaved with #312 blade.   Return if symptoms worsen or fail to improve.   Clerance Lav, DPM, FACFAS [facility]     [address] [place], [place] [address]                Office [phone]  Fax [phone]

## 2023-03-31 ENCOUNTER — Telehealth: Payer: Self-pay

## 2023-03-31 ENCOUNTER — Other Ambulatory Visit (INDEPENDENT_AMBULATORY_CARE_PROVIDER_SITE_OTHER): Payer: Self-pay | Admitting: Primary Care

## 2023-03-31 DIAGNOSIS — N3942 Incontinence without sensory awareness: Secondary | ICD-10-CM

## 2023-03-31 DIAGNOSIS — Z760 Encounter for issue of repeat prescription: Secondary | ICD-10-CM

## 2023-03-31 NOTE — Telephone Encounter (Signed)
Pre-operative Risk Assessment    Patient Name: Rhonda Hickman  DOB: [birth year] MRN: [id number]    Last office visit: 06/14/22 with Dr. Bjorn Pippin Next visit: 04/19/23 with Dr. Bjorn Pippin  Request for Surgical Clearance    Procedure:   left total kknee arthoplasty   Date of Surgery:  Clearance TBD                                 Surgeon:  Dr. Deboraha Sprang Surgeon's Group or Practice Name:  [facility] Phone number:  [phone] Fax number:  [phone]   Type of Clearance Requested:   - Pharmacy:  Hold Rivaroxaban (Xarelto) Hold 3 days prior to procedure   Type of Anesthesia:  Spinal   Additional requests/questions:    SignedMichaelle Copas   03/31/2023, 4:50 PM

## 2023-04-03 NOTE — Telephone Encounter (Signed)
Please advise holding Xarelto prior to left total knee arthroplasty.   Thank you!  DW

## 2023-04-03 NOTE — Telephone Encounter (Signed)
Name: Rhonda Hickman  DOB: [birth year]  MRN: [id number]  Primary Cardiologist: Little Ishikawa, MD  Chart reviewed as part of pre-operative protocol coverage. Because of Joyell Thi past medical history and time since last visit, she will require a follow-up in-office visit in order to better assess preoperative cardiovascular risk.  Patient has an office visit scheduled on 04/19/2023 with Dr. Bjorn Pippin. Appointment notes have been updated to reflect need for pre-op evaluation.   Pre-op covering staff:  - Please contact requesting surgeon's office via preferred method (i.e, phone, fax) to inform them of need for appointment prior to surgery.  This message will also be routed to pharmacy pool for input on holding Xarelto as requested below so that this information is available to the clearing provider at time of patient's appointment.   Carlos Levering, NP  04/03/2023, 10:59 AM

## 2023-04-03 NOTE — Telephone Encounter (Signed)
Requested Prescriptions  Refused Prescriptions Disp Refills   oxybutynin (DITROPAN) 5 MG tablet [Pharmacy Med Name: Oxybutynin Chloride 5 MG Oral Tablet] 300 tablet 2    Sig: TAKE 1 TABLET BY MOUTH 2 TO 3  TIMES DAILY FOR BLADDER SPASMS     Urology:  Bladder Agents Passed - 03/31/2023 10:13 PM      Passed - Valid encounter within last 12 months    Recent Outpatient Visits           1 month ago Type 2 diabetes mellitus with 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, with long-term current use of insulin (HCC)   [place] [facility] Grayce Sessions, NP   4 months ago Type 2 diabetes mellitus with 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, with long-term current use of insulin (HCC)   [place] [facility] Grayce Sessions, NP   7 months ago Acute pain of left shoulder   [place] [facility] Grayce Sessions, NP   9 months ago Type 2 diabetes mellitus with 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, with long-term current use of insulin [facility])   [place] [facility] Grayce Sessions, NP   10 months ago Encounter for [employer] annual wellness exam   [place] [facility] Grayce Sessions, NP       Future Appointments             In 2 weeks Little Ishikawa, MD [facility]   In 1 month Randa Evens, Kinnie Scales, NP [place] [facility]

## 2023-04-03 NOTE — Telephone Encounter (Signed)
Patient with diagnosis of afib on Xarelto for anticoagulation.    Procedure: left total kknee arthoplasty  Date of procedure: TBD   CHA2DS2-VASc Score = 7   This indicates a 11.2% annual risk of stroke. The patient's score is based upon: CHF History: 0 HTN History: 1 Diabetes History: 1 Stroke History: 2 Vascular Disease History: 1 Age Score: 1 Gender Score: 1      CrCl 91 ml/min Platelet count 259  Patient vascular disease is a positive CAC. Her stroke was several years ago.   Given her hx of stroke, will confirm with Dr. Bjorn Pippin he is ok with a 3 days hold.    **This guidance is not considered finalized until pre-operative APP has relayed final recommendations.**

## 2023-04-05 NOTE — Telephone Encounter (Signed)
Noted. Dr. Bjorn Pippin - the preop team earlier this week determined this patient is best served keeping f/u with you 10/9 to address the preop eval as well. Please route your final office note to party below once you see the patient. The team put "preop eval" as a reminder in the office notes. Thank you so much!

## 2023-04-05 NOTE — Telephone Encounter (Signed)
Ok to hold xarelto for surgery

## 2023-04-11 ENCOUNTER — Other Ambulatory Visit (INDEPENDENT_AMBULATORY_CARE_PROVIDER_SITE_OTHER): Payer: Self-pay | Admitting: Primary Care

## 2023-04-11 DIAGNOSIS — J449 Chronic obstructive pulmonary disease, unspecified: Secondary | ICD-10-CM

## 2023-04-11 DIAGNOSIS — Z760 Encounter for issue of repeat prescription: Secondary | ICD-10-CM

## 2023-04-11 NOTE — Telephone Encounter (Signed)
Requested Prescriptions  Pending Prescriptions Disp Refills   budesonide-formoterol (SYMBICORT) 160-4.5 MCG/ACT inhaler [Pharmacy Med Name: Symbicort 160 mcg-4.5 mcg/actuation HFA aerosol inhaler] 10.2 g 2    Sig: INHALE TWO PUFFS BY MOUTH INTO LUNGS TWICE DAILY     Pulmonology:  Combination Products Passed - 04/11/2023  8:04 AM      Passed - Valid encounter within last 12 months    Recent Outpatient Visits           2 months ago Type 2 diabetes mellitus with 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, with long-term current use of insulin (HCC)   [place] [facility] Grayce Sessions, NP   5 months ago Type 2 diabetes mellitus with 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, with long-term current use of insulin (HCC)   [place] [facility] Grayce Sessions, NP   7 months ago Acute pain of left shoulder   [place] [facility] Grayce Sessions, NP   9 months ago Type 2 diabetes mellitus with 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, with long-term current use of insulin [facility])   [place] [facility] Grayce Sessions, NP   10 months ago Encounter for [employer] annual wellness exam   [place] [facility] Grayce Sessions, NP       Future Appointments             In 1 week Little Ishikawa, MD [facility]   In 1 month Randa Evens, Kinnie Scales, NP [place] [facility]

## 2023-04-12 ENCOUNTER — Other Ambulatory Visit (INDEPENDENT_AMBULATORY_CARE_PROVIDER_SITE_OTHER): Payer: Self-pay | Admitting: Primary Care

## 2023-04-12 ENCOUNTER — Other Ambulatory Visit: Payer: Self-pay | Admitting: Cardiology

## 2023-04-12 DIAGNOSIS — Z760 Encounter for issue of repeat prescription: Secondary | ICD-10-CM

## 2023-04-12 DIAGNOSIS — J449 Chronic obstructive pulmonary disease, unspecified: Secondary | ICD-10-CM

## 2023-04-12 NOTE — Telephone Encounter (Unsigned)
Copied from CRM [id number]. Topic: General - Other >> Apr 12, 2023  5:39 PM Everette C wrote: Reason for CRM: Medication Refill - Medication: budesonide-formoterol (SYMBICORT) 160-4.5 MCG/ACT inhaler [[id number]]  Has the patient contacted their pharmacy? Yes.   (Agent: If no, request that the patient contact the pharmacy for the refill. If patient does not wish to contact the pharmacy document the reason why and proceed with request.) (Agent: If yes, when and what did the pharmacy advise?)  Preferred Pharmacy (with phone number or street name): [facility] [place] [address] [place] [address] Phone: [phone] Fax: [phone] Hours: Not open 24 hours   Has the patient been seen for an appointment in the last year OR does the patient have an upcoming appointment? Yes.    Agent: Please be advised that RX refills may take up to 3 business days. We ask that you follow-up with your pharmacy.

## 2023-04-13 NOTE — Telephone Encounter (Signed)
Called [facility] to verify duplicate request and spoke with [name]. Medication Rx refill claim was canceled by patient insurance when tried to be billed. Please advise and can call back if medication needs to be refilled and expedited by [facility] for delivery

## 2023-04-13 NOTE — Telephone Encounter (Signed)
Need to call pharmacy to verify receipt confirmed 04/11/23 at 3:01 pm and when medication will be sent to patient .

## 2023-04-13 NOTE — Telephone Encounter (Signed)
Requested medication (s) are due for refill today: yes   Requested medication (s) are on the active medication list: yes   Last refill:  04/11/23 #10.2 g 2 refills  Future visit scheduled: yes in 1 month  Notes to clinic:  do you want to resend Rx request? Last attempt 04/11/23 claim canceled by insurance when tried to be billed to insurance per [name] from [employer]. Please advise. When attempted to resend , notification for alternative medication requested.     Requested Prescriptions  Pending Prescriptions Disp Refills   budesonide-formoterol (SYMBICORT) 160-4.5 MCG/ACT inhaler 10.2 g 2     Pulmonology:  Combination Products Passed - 04/13/2023  8:50 AM      Passed - Valid encounter within last 12 months    Recent Outpatient Visits           2 months ago Type 2 diabetes mellitus with 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, with long-term current use of insulin (HCC)   [place] [facility] Grayce Sessions, NP   5 months ago Type 2 diabetes mellitus with 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, with long-term current use of insulin (HCC)   [place] [facility] Grayce Sessions, NP   8 months ago Acute pain of left shoulder   [place] [facility] Grayce Sessions, NP   9 months ago Type 2 diabetes mellitus with 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, with long-term current use of insulin [facility])   [place] [facility] Grayce Sessions, NP   11 months ago Encounter for [employer] annual wellness exam   [place] [facility] Grayce Sessions, NP       Future Appointments             In 6 days Little Ishikawa, MD [facility]   In 1 month Randa Evens, Kinnie Scales, NP [place] [facility]

## 2023-04-19 ENCOUNTER — Ambulatory Visit: Payer: 59 | Attending: Cardiology | Admitting: Cardiology

## 2023-04-19 ENCOUNTER — Encounter: Payer: Self-pay | Admitting: Cardiology

## 2023-04-19 ENCOUNTER — Telehealth: Payer: Self-pay

## 2023-04-19 VITALS — BP 120/78 | HR 73 | Ht 72.0 in | Wt 265.8 lb

## 2023-04-19 DIAGNOSIS — E785 Hyperlipidemia, unspecified: Secondary | ICD-10-CM

## 2023-04-19 DIAGNOSIS — I10 Essential (primary) hypertension: Secondary | ICD-10-CM | POA: Diagnosis not present

## 2023-04-19 DIAGNOSIS — Z01818 Encounter for other preprocedural examination: Secondary | ICD-10-CM | POA: Diagnosis not present

## 2023-04-19 DIAGNOSIS — R079 Chest pain, unspecified: Secondary | ICD-10-CM | POA: Diagnosis not present

## 2023-04-19 DIAGNOSIS — I480 Paroxysmal atrial fibrillation: Secondary | ICD-10-CM

## 2023-04-19 NOTE — Progress Notes (Signed)
Cardiology Office Note:    Date:  04/23/2023   ID:  Rhonda Hickman, DOB Dec 05, 1955, MRN [id number]  PCP:  Grayce Sessions, NP  Cardiologist:  Little Ishikawa, MD  Electrophysiologist:  None   Referring MD: Grayce Sessions, NP   Chief Complaint  Patient presents with   Pre-op Exam    History of Present Illness:    Rhonda Hickman is a 67 y.o. female with a hx of TIA, atrial fibrillation, BPD, COPD, T2DM, asthma who presents for follow-up.  She was referred by Rhonda Passe, NP for evaluation of atrial fibrillation, initially seen on 12/16/2019.  She reports that she was in the hospital for 5 days in [place] in May 2021.  States that she was admitted with AKI and UTI and found to have atrial fibrillation.  Started on Xarelto.  States that she has been taking this without any bleeding issues.  Reports she has had intermittent palpitations over the last month since her discharge from the hospital.  She reports a history of stabbing chest pain in the center of her chest but has not had any recently, does however chest pressure with exertion.  States that going for walks causes her to have exertional chest pressure.  She is smoking about half a pack per day, down from peak of 2 packs/day.  States that both her mother and father had MIs but unsure of age.  TTE on 08/03/2019 showed LVEF 60-65%, normal RV function, normal biatrial size, no significant valvular disease.  Lexiscan Myoview on 01/03/2020 showed low risk stress test with breast attenuation but no ischemia, LVEF 48%.  Carotid duplex on 01/14/2020 showed bilateral 1 to 39% stenosis.  Zio patch x14 days on 01/29/2020 showed 1% atrial flutter burden, average heart rate 86 bpm.  Zio patch x3 days on 05/25/2020 showed no significant arrhythmias.  Echocardiogram 08/26/2022 showed normal biventricular function, grade 1 diastolic function, no significant valvular disease.  Coronary CTA on 11/03/2022 showed mild CAD (1 to 24%  stenosis in proximal/mid LAD, D1, and proximal/mid RCA), calcium score 55 (80th percentile).  Since last clinic visit, she reports she has been under a lot of stress, reports her brother was recently diagnosed with cancer.  She continues to have intermittent chest pain, describes as pain sometimes in center of chest but sometimes in back.  Occurs about once per week.  Lasts less than 1 minute.  No relationship with exertion, but feels it may be related to stress.  She continues to have shortness of breath.  She is mostly staying in wheelchair now due to her knee pain but will sometimes walk with a walker.  She continues to smoke, currently about 1 pack/week.   Past Medical History:  Diagnosis Date   Anemia    Anxiety    Asthma    Atrial fibrillation (HCC)    Bipolar 1 disorder (HCC)    Bulging lumbar disc    Chronic pain of left knee    COPD (chronic obstructive pulmonary disease) (HCC)    CVA (cerebral vascular accident) (HCC)    Diabetes mellitus    Neuropathy    TIA (transient ischemic attack)    Vertigo     Past Surgical History:  Procedure Laterality Date   ABDOMINAL HYSTERECTOMY     CARPAL TUNNEL RELEASE Right    CHOLECYSTECTOMY     KNEE ARTHROSCOPY WITH LATERAL MENISECTOMY Left 06/08/2021   Procedure: KNEE ARTHROSCOPY WITH LATERAL MENISCECTOMY AND MEDIAL MENISCECTOMY;  Surgeon: Vickki Hearing,  MD;  Location: [facility];  Service: Orthopedics;  Laterality: Left;   KNEE ARTHROSCOPY WITH MEDIAL MENISECTOMY Right 04/14/2016   Procedure: KNEE ARTHROSCOPY WITH MEDIAL AND LATERAL MENISECTOMY, MICROFRACTURE REPAIR;  Surgeon: Vickki Hearing, MD;  Location: [facility];  Service: Orthopedics;  Laterality: Right;  lateral menisectomy - needs crutch training    Current Medications: Current Meds  Medication Sig   ACCU-CHEK GUIDE test strip CHECK BLOOD SUGAR UP TO FOUR TIMES DAILY AS DIRECTED   albuterol (VENTOLIN HFA) 108 (90 Base) MCG/ACT inhaler INHALE 2 PUFFS EVERY 6 HOURS AS NEEDED  FOR WHEEZING OR SHORTNESS OF BREATH   amLODipine (NORVASC) 5 MG tablet Take 1 tablet (5 mg total) by mouth daily.   blood glucose meter kit and supplies Dispense based on patient and insurance preference. Use up to four times daily as directed. (FOR ICD-10 E10.9, E11.9).   budesonide-formoterol (SYMBICORT) 160-4.5 MCG/ACT inhaler INHALE TWO PUFFS BY MOUTH INTO LUNGS TWICE DAILY   buPROPion (WELLBUTRIN SR) 150 MG 12 hr tablet TAKE ONE TABLET BY MOUTH TWICE DAILY @ 9AM & 9PM   citalopram (CELEXA) 40 MG tablet TAKE ONE TABLET BY MOUTH DAILY AT 9AM   divalproex (DEPAKOTE ER) 500 MG 24 hr tablet Take 1-2 tablets by mouth 2 (two) times daily. One tablet in the morning and 2 tablets at bedtime   FEROSUL 325 (65 Fe) MG tablet TAKE ONE TABLET BY MOUTH DAILY AT 9AM WITH BREAKFAST   fluticasone (FLONASE) 50 MCG/ACT nasal spray Place 2 sprays into both nostrils daily.   GNP ULTICARE PEN NEEDLES 32G X 4 MM MISC USE TO INJECT INSULIN UP TO 5 TIMES A DAY   HYDROcodone-acetaminophen (NORCO) 10-325 MG tablet Take 1 tablet by mouth 4 (four) times daily as needed.   insulin lispro (HUMALOG) 100 UNIT/ML KwikPen Inject 20 Units into the skin 2 (two) times daily.   Insulin Syringe-Needle U-100 (ADVOCATE INSULIN SYRINGE) 29G X 1/2" 0.3 ML MISC Use to inject insulin 5x daily.   ivabradine (CORLANOR) 5 MG TABS tablet Take tablets (10mg ) TWO hours prior to your cardiac CT scan.   JANUVIA 100 MG tablet Take 100 mg by mouth daily.   LANTUS SOLOSTAR 100 UNIT/ML Solostar Pen INJECT SUBCUTANEOUSLY 20 UNITS  TWICE DAILY   lidocaine (LIDODERM) 5 % Place 1 patch onto the skin daily. Remove & Discard patch within 12 hours or as directed by MD   metFORMIN (GLUCOPHAGE) 1000 MG tablet Take 1 tablet (1,000 mg total) by mouth 2 (two) times daily. TAKE ONE TABLET BY MOUTH TWICE DAILY @ 9AM & 5PM WITH MEALS   metoprolol tartrate (LOPRESSOR) 50 MG tablet TAKE ONE TABLET (50MG ) BY MOUTH TWICE DAILY @9AM -5PM   oxybutynin (DITROPAN) 5 MG  tablet TAKE 1 TABLET BY MOUTH 2 TO 3  TIMES DAILY FOR BLADDER SPASMS   OZEMPIC, 0.25 OR 0.5 MG/DOSE, 2 MG/3ML SOPN INJECT SUBCUTANEOUSLY 0.5 MG  EVERY WEEK   PEG-KCl-NaCl-NaSulf-Na Asc-C (PLENVU) 140 g SOLR Take 140 g by mouth as directed. Manufacturer's coupon Universal coupon code:BIN: [id number]; GROUP: [id number]; PCN: [id number]; ID: [id number]; PAY NO MORE $50   XARELTO 20 MG TABS tablet TAKE ONE TABLET BY MOUTH DAILY AT 5PM WITH SUPPER   [DISCONTINUED] rosuvastatin (CRESTOR) 10 MG tablet Take 1 tablet (10 mg total) by mouth daily.     Allergies:   Aspirin   Social History   Socioeconomic History   Marital status: Single    Spouse name: Not on file   Number of children: 0  Years of education: 68   Highest education level: High school graduate  Occupational History   Occupation: Unemployed  Tobacco Use   Smoking status: Some Days    Current packs/day: 0.50    Types: Cigarettes   Smokeless tobacco: Never   Tobacco comments:    tobacco info given  Vaping Use   Vaping status: Some Days  Substance and Sexual Activity   Alcohol use: No   Drug use: No   Sexual activity: Never  Other Topics Concern   Not on file  Social History Narrative   Lives at home alone.   Right-handed.   No caffeine use.   Social Determinants of Health   Financial Resource Strain: Not on file  Food Insecurity: Not on file  Transportation Needs: Not on file  Physical Activity: Not on file  Stress: Not on file  Social Connections: Not on file     Family History: The patient's family history includes Diabetes in her mother; Heart attack in her father.  ROS:   Please see the history of present illness.     All other systems reviewed and are negative.  EKGs/Labs/Other Studies Reviewed:    The following studies were reviewed today:   EKG:   06/14/22: Normal sinus rhythm, rate 77, left axis deviation, poor R wave progression 04/19/2023: Normal sinus rhythm, rate 73, left axis deviation  Recent  Labs: 04/19/2023: ALT 21; BUN 10; Creatinine, Ser 0.94; Hemoglobin 12.8; Platelets 241; Potassium 4.6; Sodium 139  Recent Lipid Panel    Component Value Date/Time   CHOL 90 (L) 04/19/2023 0958   TRIG 80 04/19/2023 0958   HDL 32 (L) 04/19/2023 0958   CHOLHDL 2.8 04/19/2023 0958   CHOLHDL 11.2 08/04/2019 0548   VLDL 32 08/04/2019 0548   LDLCALC 41 04/19/2023 0958    Physical Exam:    VS:  BP 120/78 (BP Location: Left Arm, Patient Position: Sitting, Cuff Size: Normal)   Pulse 73   Ht 6' (1.829 m)   Wt 265 lb 12.8 oz (120.6 kg)   SpO2 98%   BMI 36.05 kg/m     Wt Readings from Last 3 Encounters:  04/19/23 265 lb 12.8 oz (120.6 kg)  03/20/23 280 lb (127 kg)  12/09/22 280 lb (127 kg)     GEN: in no acute distress HEENT: Normal NECK: No JVD LYMPHATICS: No lymphadenopathy CARDIAC: RRR, no murmurs, rubs, gallops RESPIRATORY:  Clear to auscultation without rales, wheezing or rhonchi  ABDOMEN: Soft, non-tender, non-distended MUSCULOSKELETAL:  No edema SKIN: Warm and dry NEUROLOGIC:  Alert and oriented x 3 PSYCHIATRIC:  Normal affect   ASSESSMENT:    1. Pre-op evaluation   2. Chest pain of uncertain etiology   3. Essential hypertension   4. Hyperlipidemia, unspecified hyperlipidemia type   5. PAF (paroxysmal atrial fibrillation) (HCC)      PLAN:    Preop evaluation: Prior to knee surgery.  Limited functional capacity, less than 4 METS.  Does report chest pain/dyspnea on exertion.  Underwent recent cardiac workup with echocardiogram 08/26/2022 showed normal biventricular function, grade 1 diastolic function, no significant valvular disease.  Coronary CTA on 11/03/2022 showed mild CAD (1 to 24% stenosis in proximal/mid LAD, D1, and proximal/mid RCA), calcium score 55 (80th percentile). -Given unremarkable echocardiogram and nonobstructive CAD on coronary CTA earlier this year, no further cardiac workup recommended prior to her surgery.  Would hold Xarelto for 2 days prior to  surgery and restart once okay from surgical standpoint  Chest pain: Atypical  in description but does have significant risk factors (type 2 diabetes, hypertension, tobacco use).  TTE on 08/03/2019 showed LVEF 60-65%, normal RV function, normal biatrial size, no significant valvular disease.  Lexiscan Myoview on 01/03/2020 showed low risk stress test with breast attenuation but no ischemia, LVEF 48%.  Given ongoing chest pain, Coronary CTA was done on 11/03/2022 showed mild CAD (1 to 24% stenosis in proximal/mid LAD, D1, and proximal/mid RCA), calcium score 55 (80th percentile).  Dizziness: unclear cause.  Head CT negative.  Othostatics in clinic were unremarkable.  Possibly vertigo by description, started on meclizine and referred to neurology for further evaluation.  Brain MRI on 06/16/2020 was unremarkable.  Zio patch x3 days on 05/25/2020 showed no significant arrhythmias.  Atrial fibrillation: CHA2DS2-VASc score 5 (hypertension, diabetes, CVA, female).  Zio patch x14 days on 01/29/2020 showed 1% atrial flutter burden, average heart rate 86 bpm. -Continue Xarelto.   -Continue metoprolol 25 mg twice daily  Hypertension: On amlodipine 5 mg daily, metoprolol 50 mg twice daily.  Appears controlled  T2DM: On insulin.  A1c 7.3% on 02/2023  Hyperlipidemia: On rosuvastatin 10 mg daily.  LDL 53 on 06/20/22.  We will check lipid panel  Tobacco use: Patient counseled on the risks of tobacco use and cessation strongly encouraged.    TIA: Follows with neurology.  Carotid duplex on 01/14/2020 showed bilateral 1 to 39% stenosis.  Zio patch x14 days on 01/29/2020 showed 1% atrial flutter burden, average heart rate 86 bpm.  Snoring: sleep study with mild OSA, no indication for CPAP at this time  Rectal bleeding: Has noted intermittent bright red blood per rectum.  On Xarelto for atrial fibrillation.  Will check CBC.  Had planned for colonoscopy but reports did not have done, encouraged follow-up  RTC in 6  months  Medication Adjustments/Labs and Tests Ordered: Current medicines are reviewed at length with the patient today.  Concerns regarding medicines are outlined above.  Orders Placed This Encounter  Procedures   CBC   Comprehensive metabolic panel   Lipid panel   EKG 12-Lead   No orders of the defined types were placed in this encounter.   Patient Instructions  Medication Instructions:  NO CHANGES *If you need a refill on your cardiac medications before your next appointment, please call your pharmacy*   Lab Work: Lipid Panel, CMET, CBC today   If you have labs (blood work) drawn today and your tests are completely normal, you will receive your results only by: MyChart Message (if you have MyChart) OR A paper copy in the mail If you have any lab test that is abnormal or we need to change your treatment, we will call you to review the results.   Follow-Up: At [facility], you and your health needs are our priority.  As part of our continuing mission to provide you with exceptional heart care, we have created designated Provider Care Teams.  These Care Teams include your primary Cardiologist (physician) and Advanced Practice Providers (APPs -  Physician Assistants and Nurse Practitioners) who all work together to provide you with the care you need, when you need it.  We recommend signing up for the patient portal called "MyChart".  Sign up information is provided on this After Visit Summary.  MyChart is used to connect with patients for Virtual Visits (Telemedicine).  Patients are able to view lab/test results, encounter notes, upcoming appointments, etc.  Non-urgent messages can be sent to your provider as well.   To learn more  about what you can do with MyChart, go to [email or web address].    Your next appointment:    6 months with Dr. Bjorn Pippin Other Instructions  HOLD Carlena Hurl TWO DAYS PRIOR TO SURGERY    Signed, Little Ishikawa, MD  04/23/2023 9:58  PM    [facility]

## 2023-04-19 NOTE — Patient Instructions (Signed)
Medication Instructions:  NO CHANGES *If you need a refill on your cardiac medications before your next appointment, please call your pharmacy*   Lab Work: Lipid Panel, CMET, CBC today   If you have labs (blood work) drawn today and your tests are completely normal, you will receive your results only by: MyChart Message (if you have MyChart) OR A paper copy in the mail If you have any lab test that is abnormal or we need to change your treatment, we will call you to review the results.   Follow-Up: At [facility], you and your health needs are our priority.  As part of our continuing mission to provide you with exceptional heart care, we have created designated Provider Care Teams.  These Care Teams include your primary Cardiologist (physician) and Advanced Practice Providers (APPs -  Physician Assistants and Nurse Practitioners) who all work together to provide you with the care you need, when you need it.  We recommend signing up for the patient portal called "MyChart".  Sign up information is provided on this After Visit Summary.  MyChart is used to connect with patients for Virtual Visits (Telemedicine).  Patients are able to view lab/test results, encounter notes, upcoming appointments, etc.  Non-urgent messages can be sent to your provider as well.   To learn more about what you can do with MyChart, go to [email or web address].    Your next appointment:    6 months with Dr. Bjorn Pippin Other Instructions  HOLD XARELTO TWO DAYS PRIOR TO SURGERY

## 2023-04-19 NOTE — Telephone Encounter (Signed)
Patient saw cardiologist for clearance today.  I called patient and left voice mail for return call to discuss scheduling surgery.

## 2023-04-20 LAB — COMPREHENSIVE METABOLIC PANEL
ALT: 21 [IU]/L (ref 0–32)
AST: 17 [IU]/L (ref 0–40)
Albumin: 4.2 g/dL (ref 3.9–4.9)
Alkaline Phosphatase: 67 [IU]/L (ref 44–121)
BUN/Creatinine Ratio: 11 — ABNORMAL LOW (ref 12–28)
BUN: 10 mg/dL (ref 8–27)
Bilirubin Total: <0.2 mg/dL (ref 0.0–1.2)
CO2: 22 mmol/L (ref 20–29)
Calcium: 9.5 mg/dL (ref 8.7–10.3)
Chloride: 103 mmol/L (ref 96–106)
Creatinine, Ser: 0.94 mg/dL (ref 0.57–1.00)
Globulin, Total: 3.3 g/dL (ref 1.5–4.5)
Glucose: 121 mg/dL — ABNORMAL HIGH (ref 70–99)
Potassium: 4.6 mmol/L (ref 3.5–5.2)
Sodium: 139 mmol/L (ref 134–144)
Total Protein: 7.5 g/dL (ref 6.0–8.5)
eGFR: 67 mL/min/{1.73_m2} (ref >59)

## 2023-04-20 LAB — CBC
Hematocrit: 40.2 % (ref 34.0–46.6)
Hemoglobin: 12.8 g/dL (ref 11.1–15.9)
MCH: 28.2 pg (ref 26.6–33.0)
MCHC: 31.8 g/dL (ref 31.5–35.7)
MCV: 89 fL (ref 79–97)
Platelets: 241 10*3/uL (ref 150–450)
RBC: 4.54 x10E6/uL (ref 3.77–5.28)
RDW: 14.8 % (ref 11.7–15.4)
WBC: 7.5 10*3/uL (ref 3.4–10.8)

## 2023-04-20 LAB — LIPID PANEL
Chol/HDL Ratio: 2.8 {ratio} (ref 0.0–4.4)
Cholesterol, Total: 90 mg/dL — ABNORMAL LOW (ref 100–199)
HDL: 32 mg/dL — ABNORMAL LOW (ref >39)
LDL Chol Calc (NIH): 41 mg/dL (ref 0–99)
Triglycerides: 80 mg/dL (ref 0–149)
VLDL Cholesterol Cal: 17 mg/dL (ref 5–40)

## 2023-04-21 ENCOUNTER — Telehealth: Payer: Self-pay | Admitting: Cardiology

## 2023-04-21 ENCOUNTER — Other Ambulatory Visit: Payer: Self-pay | Admitting: *Deleted

## 2023-04-21 MED ORDER — ROSUVASTATIN CALCIUM 10 MG PO TABS: 10.0000 mg | ORAL_TABLET | Freq: Every day | ORAL | 2 refills | Status: DC

## 2023-04-21 NOTE — Telephone Encounter (Signed)
*  STAT* If patient is at the pharmacy, call can be transferred to refill team.   1. Which medications need to be refilled? (please list name of each medication and dose if known) new prescription for Rosuvastatin- new pharmacy   2. Would you like to learn more about the convenience, safety, & potential cost savings by using the [facility]?    3. Are you open to using the [facility] (Type [facility].    4. Which pharmacy/location (including street and city if local pharmacy) is medication to be sent to? [employer] [facility]  5. Do they need a 30 day or 90 day supply? 90 days and refills- please call today- out of medicine

## 2023-04-24 ENCOUNTER — Other Ambulatory Visit (INDEPENDENT_AMBULATORY_CARE_PROVIDER_SITE_OTHER): Payer: Self-pay | Admitting: Primary Care

## 2023-04-24 DIAGNOSIS — E782 Mixed hyperlipidemia: Secondary | ICD-10-CM

## 2023-04-24 DIAGNOSIS — J449 Chronic obstructive pulmonary disease, unspecified: Secondary | ICD-10-CM

## 2023-04-24 DIAGNOSIS — Z760 Encounter for issue of repeat prescription: Secondary | ICD-10-CM

## 2023-04-24 DIAGNOSIS — I152 Hypertension secondary to endocrine disorders: Secondary | ICD-10-CM

## 2023-04-24 DIAGNOSIS — N3942 Incontinence without sensory awareness: Secondary | ICD-10-CM

## 2023-04-24 NOTE — Telephone Encounter (Signed)
Medication Refill - Medication: budesonide-formoterol (SYMBICORT) 160-4.5 MCG/ACT inhaler [[id number]]   amLODipine (NORVASC) 5 MG tablet [[id number]]   JANUVIA 100 MG tablet [[id number]]   oxybutynin (DITROPAN) 5 MG tablet [[id number]]    Has the patient contacted their pharmacy? Yes.   (Agent: If no, request that the patient contact the pharmacy for the refill. If patient does not wish to contact the pharmacy document the reason why and proceed with request.) (Agent: If yes, when and what did the pharmacy advise?)  Preferred Pharmacy (with phone number or street name):  [facility] [place] - [address] Phone: [phone]  Fax: [phone]     Has the patient been seen for an appointment in the last year OR does the patient have an upcoming appointment? Yes.    Agent: Please be advised that RX refills may take up to 3 business days. We ask that you follow-up with your pharmacy.

## 2023-04-25 MED ORDER — AMLODIPINE BESYLATE 5 MG PO TABS
5.0000 mg | ORAL_TABLET | Freq: Every day | ORAL | 1 refills | Status: DC
Start: 2023-04-25 — End: 2023-05-28

## 2023-04-25 MED ORDER — JANUVIA 100 MG PO TABS: 100.0000 mg | ORAL_TABLET | Freq: Every day | ORAL | 1 refills | Status: DC

## 2023-04-25 MED ORDER — BUDESONIDE-FORMOTEROL FUMARATE 80-4.5 MCG/ACT IN AERO: 2.0000 | INHALATION_SPRAY | Freq: Two times a day (BID) | RESPIRATORY_TRACT | 12 refills | Status: DC

## 2023-04-25 MED ORDER — OXYBUTYNIN CHLORIDE 5 MG PO TABS
ORAL_TABLET | ORAL | 3 refills | Status: DC
Start: 2023-04-25 — End: 2023-05-28

## 2023-04-25 NOTE — Telephone Encounter (Signed)
Requested medication (s) are due for refill today:   Yes  Requested medication (s) are on the active medication list:   Yes except Januvia is historical  Future visit scheduled:   Yes 11/4   LOV was 2 mo. ago   Last ordered: Amlodipine 01/06/2023 #90, 1 refill-requesting too soon;   Januvia 09/17/2022 listed as historical;   Ditropan 07/05/2022 #270, 3 refills;   Symbicort is a duplicate request Rx is for 04/11/2023 10.2 g, 2 refills-Pharmacy requesting how many to be dispensed and how many refills needed.   Returned because pharmacy requesting dispense amount and dosages.    Requested Prescriptions  Pending Prescriptions Disp Refills   amLODipine (NORVASC) 5 MG tablet 90 tablet 1    Sig: Take 1 tablet (5 mg total) by mouth daily.     Cardiovascular: Calcium Channel Blockers 2 Passed - 04/24/2023 11:14 AM      Passed - Last BP in normal range    BP Readings from Last 1 Encounters:  04/19/23 120/78         Passed - Last Heart Rate in normal range    Pulse Readings from Last 1 Encounters:  04/19/23 73         Passed - Valid encounter within last 6 months    Recent Outpatient Visits           2 months ago Type 2 diabetes mellitus with 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, with long-term current use of insulin (HCC)   [place] [facility] Grayce Sessions, NP   5 months ago Type 2 diabetes mellitus with 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, with long-term current use of insulin (HCC)   [place] [facility] Grayce Sessions, NP   8 months ago Acute pain of left shoulder   [place] [facility] Grayce Sessions, NP   10 months ago Type 2 diabetes mellitus with 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, with long-term current use of insulin [facility])   [place] [facility] Grayce Sessions, NP   11 months ago Encounter for [employer] annual wellness exam   [place] [facility] Grayce Sessions, NP       Future  Appointments             Tomorrow Kathryne Hitch, MD [facility]   In 2 weeks Grayce Sessions, NP  [facility]             JANUVIA 100 MG tablet      Sig: Take 1 tablet (100 mg total) by mouth daily.     Endocrinology:  Diabetes - DPP-4 Inhibitors Passed - 04/24/2023 11:14 AM      Passed - HBA1C is between 0 and 7.9 and within 180 days    HbA1c, POC (controlled diabetic range)  Date Value Ref Range Status  02/09/2023 7.3 (A) 0.0 - 7.0 % Final         Passed - Cr in normal range and within 360 days    Creatinine, Ser  Date Value Ref Range Status  04/19/2023 0.94 0.57 - 1.00 mg/dL Final   Creatinine, Urine  Date Value Ref Range Status  12/17/2019 128.15 mg/dL Final    Comment:    Performed at [facility], [address] [place], [place] [address]         Passed - Valid encounter within last 6 months    Recent Outpatient Visits  2 months ago Type 2 diabetes mellitus with 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, with long-term current use of insulin (HCC)   [place] [facility] Grayce Sessions, NP   5 months ago Type 2 diabetes mellitus with 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, with long-term current use of insulin (HCC)   [place] [facility] Grayce Sessions, NP   8 months ago Acute pain of left shoulder   [employer] [facility] Grayce Sessions, NP   10 months ago Type 2 diabetes mellitus with hyperosmolarity without coma, with long-term current use of insulin (HCC)   [place] [facility] Grayce Sessions, NP   11 months ago Encounter for [employer] annual wellness exam   [place] [facility] Grayce Sessions, NP       Future Appointments             Tomorrow Kathryne Hitch, MD [facility]   In 2 weeks Grayce Sessions, NP [place] [facility]              oxybutynin (DITROPAN) 5 MG tablet 270 tablet 3    Sig: TAKE 1 TABLET BY MOUTH 2 TO 3  TIMES DAILY FOR BLADDER SPASMS     Urology:  Bladder Agents Passed - 04/24/2023 11:14 AM      Passed - Valid encounter within last 12 months    Recent Outpatient Visits           2 months ago Type 2 diabetes mellitus with hyperosmolarity without coma, with long-term current use of insulin (HCC)   [place] [facility] Grayce Sessions, NP   5 months ago Type 2 diabetes mellitus with hyperosmolarity without coma, with long-term current use of insulin (HCC)   [place] [facility] Grayce Sessions, NP   8 months ago Acute pain of left shoulder   [place] [facility] Grayce Sessions, NP   10 months ago Type 2 diabetes mellitus with hyperosmolarity without coma, with long-term current use of insulin (HCC)   [place] [facility] Grayce Sessions, NP   11 months ago Encounter for [employer] annual wellness exam   [place] [facility] Grayce Sessions, NP       Future Appointments             Tomorrow Kathryne Hitch, MD [facility]   In 2 weeks Grayce Sessions, NP [place] [facility]             budesonide-formoterol [facility]) 160-4.5 MCG/ACT inhaler 10.2 g 2     Pulmonology:  Combination Products Passed - 04/24/2023 11:14 AM      Passed - Valid encounter within last 12 months    Recent Outpatient Visits           2 months ago Type 2 diabetes mellitus with hyperosmolarity without coma, with long-term current use of insulin (HCC)   [place] [facility] Grayce Sessions, NP   5 months ago Type 2 diabetes mellitus with hyperosmolarity without coma, with long-term current use of insulin (HCC)    [facility] Grayce Sessions, NP   8 months ago Acute pain of left shoulder   [facility] Grayce Sessions, NP   10 months ago Type 2 diabetes mellitus with hyperosmolarity without coma, with long-term current use of insulin (HCC)  [place] [facility] Grayce Sessions, NP   11 months ago Encounter for [employer] annual wellness exam   [place] [facility] Grayce Sessions, NP       Future Appointments             Tomorrow Kathryne Hitch, MD [facility]   In 2 weeks Grayce Sessions, NP [facility]

## 2023-04-25 NOTE — Telephone Encounter (Signed)
Will forward to provider  

## 2023-04-26 ENCOUNTER — Ambulatory Visit (INDEPENDENT_AMBULATORY_CARE_PROVIDER_SITE_OTHER): Payer: 59 | Admitting: Orthopaedic Surgery

## 2023-04-26 DIAGNOSIS — M25512 Pain in left shoulder: Secondary | ICD-10-CM

## 2023-04-26 DIAGNOSIS — G8929 Other chronic pain: Secondary | ICD-10-CM

## 2023-04-26 MED ORDER — METHYLPREDNISOLONE 4 MG PO TABS: ORAL_TABLET | ORAL | 0 refills | Status: DC

## 2023-04-26 NOTE — Progress Notes (Signed)
The patient is well-known to [place].  She is scheduled for knee replacement in December but comes in with chronic left shoulder pain today.  This has been a longstanding process and problem for her.  It does wake her up at night.  She is on chronic narcotics.  She cannot take anti-inflammatories due to being on Xarelto.  She has a needle phobia and would like to not have any type of injection.  X-rays of her left shoulder on the canopy system show just some mild arthritic changes at the glenohumeral joint and the [facility] joint but the shoulder is well located.  On exam I can easily move her left shoulder but she has a lot of guarding and withdrawal to pain but there is no blocks or rotation in the left shoulder is well located.  I offered her an injection of steroid but she has declined this.  She is also would like to not have physical therapy.  Her blood glucose is under much better control with a hemoglobin A1c of 7.3 so I will release try a steroid taper for just a week only and see if this helps.  She will watch her carbohydrate and blood glucose intake in the interim.  We will still plan on her knee replacement surgery in December.  She has received cardiac clearance.

## 2023-05-07 ENCOUNTER — Other Ambulatory Visit (INDEPENDENT_AMBULATORY_CARE_PROVIDER_SITE_OTHER): Payer: Self-pay | Admitting: Primary Care

## 2023-05-07 DIAGNOSIS — E1100 Type 2 diabetes mellitus with hyperosmolarity without nonketotic hyperglycemic-hyperosmolar coma (NKHHC): Secondary | ICD-10-CM

## 2023-05-08 NOTE — Telephone Encounter (Signed)
Requested Prescriptions  Refused Prescriptions Disp Refills   OZEMPIC, 0.25 OR 0.5 MG/DOSE, 2 MG/3ML SOPN [Pharmacy Med Name: OZEMPIC  0.25MG  0.5MG  SOLUTION PEN-INJECTOR  PEN DOSE] 9 mL 3    Sig: INJECT SUBCUTANEOUSLY 0.5 MG  EVERY WEEK     Endocrinology:  Diabetes - GLP-1 Receptor Agonists - semaglutide Failed - 05/07/2023  5:54 AM      Failed - HBA1C in normal range and within 180 days    HbA1c, POC (controlled diabetic range)  Date Value Ref Range Status  02/09/2023 7.3 (A) 0.0 - 7.0 % Final         Passed - Cr in normal range and within 360 days    Creatinine, Ser  Date Value Ref Range Status  04/19/2023 0.94 0.57 - 1.00 mg/dL Final   Creatinine, Urine  Date Value Ref Range Status  12/17/2019 128.15 mg/dL Final    Comment:    Performed at [facility], [address] [place], [place] [address]         Passed - Valid encounter within last 6 months    Recent Outpatient Visits           2 months ago Type 2 diabetes mellitus with hyperosmolarity without coma, with long-term current use of insulin (HCC)   [place] [facility] Grayce Sessions, NP   6 months ago Type 2 diabetes mellitus with hyperosmolarity without coma, with long-term current use of insulin (HCC)   [place] [facility] Grayce Sessions, NP   8 months ago Acute pain of left shoulder   [place] [facility] Grayce Sessions, NP   10 months ago Type 2 diabetes mellitus with hyperosmolarity without coma, with long-term current use of insulin [facility])   [place] [facility] Grayce Sessions, NP   11 months ago Encounter for [employer] annual wellness exam   [place] [facility] Grayce Sessions, NP       Future Appointments             In 1 week Randa Evens, Kinnie Scales, NP [facility] [facility]

## 2023-05-12 ENCOUNTER — Telehealth (INDEPENDENT_AMBULATORY_CARE_PROVIDER_SITE_OTHER): Payer: Self-pay | Admitting: Primary Care

## 2023-05-12 NOTE — Telephone Encounter (Signed)
Called to confirm apt with pt. VM was left.

## 2023-05-15 ENCOUNTER — Telehealth (INDEPENDENT_AMBULATORY_CARE_PROVIDER_SITE_OTHER): Payer: Self-pay | Admitting: Primary Care

## 2023-05-15 ENCOUNTER — Ambulatory Visit (INDEPENDENT_AMBULATORY_CARE_PROVIDER_SITE_OTHER): Payer: 59 | Admitting: Primary Care

## 2023-05-15 NOTE — Telephone Encounter (Signed)
Called pt to reschedule apt. I was able to speak to the person who is responsible for bringing her to our office. Apt scheduled for 910 on 11/11

## 2023-05-18 ENCOUNTER — Other Ambulatory Visit: Payer: Self-pay | Admitting: Cardiology

## 2023-05-22 ENCOUNTER — Ambulatory Visit (INDEPENDENT_AMBULATORY_CARE_PROVIDER_SITE_OTHER): Payer: 59 | Admitting: Primary Care

## 2023-05-22 ENCOUNTER — Other Ambulatory Visit (INDEPENDENT_AMBULATORY_CARE_PROVIDER_SITE_OTHER): Payer: Self-pay

## 2023-05-22 ENCOUNTER — Other Ambulatory Visit (INDEPENDENT_AMBULATORY_CARE_PROVIDER_SITE_OTHER): Payer: Self-pay | Admitting: Primary Care

## 2023-05-22 DIAGNOSIS — Z760 Encounter for issue of repeat prescription: Secondary | ICD-10-CM

## 2023-05-22 DIAGNOSIS — E1100 Type 2 diabetes mellitus with hyperosmolarity without nonketotic hyperglycemic-hyperosmolar coma (NKHHC): Secondary | ICD-10-CM

## 2023-05-22 DIAGNOSIS — Z794 Long term (current) use of insulin: Secondary | ICD-10-CM

## 2023-05-22 MED ORDER — METFORMIN HCL 1000 MG PO TABS: 1000.0000 mg | ORAL_TABLET | Freq: Two times a day (BID) | ORAL | 1 refills | Status: DC

## 2023-05-24 ENCOUNTER — Other Ambulatory Visit (INDEPENDENT_AMBULATORY_CARE_PROVIDER_SITE_OTHER): Payer: Self-pay | Admitting: Primary Care

## 2023-05-27 ENCOUNTER — Other Ambulatory Visit (INDEPENDENT_AMBULATORY_CARE_PROVIDER_SITE_OTHER): Payer: Self-pay | Admitting: Primary Care

## 2023-05-27 DIAGNOSIS — E782 Mixed hyperlipidemia: Secondary | ICD-10-CM

## 2023-05-27 DIAGNOSIS — I152 Hypertension secondary to endocrine disorders: Secondary | ICD-10-CM

## 2023-05-27 DIAGNOSIS — N3942 Incontinence without sensory awareness: Secondary | ICD-10-CM

## 2023-05-27 DIAGNOSIS — Z760 Encounter for issue of repeat prescription: Secondary | ICD-10-CM

## 2023-05-29 ENCOUNTER — Ambulatory Visit (INDEPENDENT_AMBULATORY_CARE_PROVIDER_SITE_OTHER): Payer: 59 | Admitting: Primary Care

## 2023-05-29 ENCOUNTER — Encounter (INDEPENDENT_AMBULATORY_CARE_PROVIDER_SITE_OTHER): Payer: Self-pay | Admitting: Primary Care

## 2023-05-29 ENCOUNTER — Telehealth: Payer: Self-pay

## 2023-05-29 VITALS — BP 111/76 | HR 82 | Resp 16

## 2023-05-29 DIAGNOSIS — E782 Mixed hyperlipidemia: Secondary | ICD-10-CM

## 2023-05-29 DIAGNOSIS — E2839 Other primary ovarian failure: Secondary | ICD-10-CM | POA: Diagnosis not present

## 2023-05-29 DIAGNOSIS — Z1211 Encounter for screening for malignant neoplasm of colon: Secondary | ICD-10-CM

## 2023-05-29 DIAGNOSIS — Z2821 Immunization not carried out because of patient refusal: Secondary | ICD-10-CM

## 2023-05-29 DIAGNOSIS — Z794 Long term (current) use of insulin: Secondary | ICD-10-CM

## 2023-05-29 DIAGNOSIS — E1100 Type 2 diabetes mellitus with hyperosmolarity without nonketotic hyperglycemic-hyperosmolar coma (NKHHC): Secondary | ICD-10-CM | POA: Diagnosis not present

## 2023-05-29 DIAGNOSIS — Z1159 Encounter for screening for other viral diseases: Secondary | ICD-10-CM | POA: Diagnosis not present

## 2023-05-29 LAB — POCT GLYCOSYLATED HEMOGLOBIN (HGB A1C): HbA1c, POC (controlled diabetic range): 6.8 % (ref 0.0–7.0)

## 2023-05-29 NOTE — Telephone Encounter (Signed)
Email sent to [employer] requesting an update on the status of her order for a power wheelchair

## 2023-05-29 NOTE — Progress Notes (Signed)
[facility]  Rhonda Hickman, is a 67 y.o. female  [id number]  [id number]  DOB - [birth year]  Chief Complaint  Patient presents with   Diabetes       Subjective:   Rhonda Hickman is a 67 y.o. female here today for a follow up visit. HTN- Patient has No headache, No chest pain, No abdominal pain - No Nausea, No new weakness tingling or numbness, No Cough - shortness of breath and T2D- Denies polyuria, polydipsia, polyphasia or vision changes.  Does not check blood sugars at home.   No problems updated.  Allergies  Allergen Reactions   Aspirin Nausea Only and Other (See Comments)    Causes stomach pain    Past Medical History:  Diagnosis Date   Anemia    Anxiety    Asthma    Atrial fibrillation (HCC)    Bipolar 1 disorder (HCC)    Bulging lumbar disc    Chronic pain of left knee    COPD (chronic obstructive pulmonary disease) (HCC)    CVA (cerebral vascular accident) (HCC)    Diabetes mellitus    Neuropathy    TIA (transient ischemic attack)    Vertigo     Current Outpatient Medications on File Prior to Visit  Medication Sig Dispense Refill   ACCU-CHEK GUIDE test strip CHECK BLOOD SUGAR UP TO FOUR TIMES DAILY AS DIRECTED 100 strip 11   albuterol (VENTOLIN HFA) 108 (90 Base) MCG/ACT inhaler INHALE 2 PUFFS EVERY 6 HOURS AS NEEDED FOR WHEEZING OR SHORTNESS OF BREATH 18 g 1   amLODipine (NORVASC) 5 MG tablet TAKE 1 TABLET BY MOUTH DAILY 80 tablet 3   blood glucose meter kit and supplies Dispense based on patient and insurance preference. Use up to four times daily as directed. (FOR ICD-10 E10.9, E11.9). 1 each 0   budesonide-formoterol (SYMBICORT) 80-4.5 MCG/ACT inhaler Inhale 2 puffs into the lungs 2 (two) times daily. INHALE TWO PUFFS BY MOUTH INTO LUNGS TWICE DAILY Strength: 160-4.5 MCG/ACT 1 each 12   buPROPion (WELLBUTRIN SR) 150 MG 12 hr tablet TAKE ONE TABLET BY MOUTH TWICE DAILY @ 9AM & 9PM 180 tablet 11   citalopram (CELEXA) 40 MG tablet  TAKE ONE TABLET BY MOUTH DAILY AT 9AM 90 tablet 1   divalproex (DEPAKOTE ER) 500 MG 24 hr tablet Take 1-2 tablets by mouth 2 (two) times daily. One tablet in the morning and 2 tablets at bedtime     FEROSUL 325 (65 Fe) MG tablet TAKE ONE TABLET BY MOUTH DAILY AT 9AM WITH BREAKFAST 90 tablet 11   fluticasone (FLONASE) 50 MCG/ACT nasal spray Place 2 sprays into both nostrils daily. 16 g 6   GNP ULTICARE PEN NEEDLES 32G X 4 MM MISC USE TO INJECT INSULIN UP TO 5 TIMES A DAY 200 each 6   HYDROcodone-acetaminophen (NORCO) 10-325 MG tablet Take 1 tablet by mouth 4 (four) times daily as needed.     insulin lispro (HUMALOG) 100 UNIT/ML KwikPen Inject 20 Units into the skin 2 (two) times daily. 15 mL 3   Insulin Syringe-Needle U-100 (ADVOCATE INSULIN SYRINGE) 29G X 1/2" 0.3 ML MISC Use to inject insulin 5x daily. 200 each 6   ivabradine (CORLANOR) 5 MG TABS tablet Take tablets (10mg ) TWO hours prior to your cardiac CT scan. 2 tablet 0   JANUVIA 100 MG tablet Take 1 tablet (100 mg total) by mouth daily. 90 tablet 1   LANTUS SOLOSTAR 100 UNIT/ML Solostar Pen INJECT SUBCUTANEOUSLY 20 UNITS  TWICE  DAILY 45 mL 2   lidocaine (LIDODERM) 5 % Place 1 patch onto the skin daily. Remove & Discard patch within 12 hours or as directed by MD 30 patch 0   metFORMIN (GLUCOPHAGE) 1000 MG tablet Take 1 tablet (1,000 mg total) by mouth 2 (two) times daily. TAKE ONE TABLET BY MOUTH TWICE DAILY @ 9AM & 5PM WITH MEALS 180 tablet 1   methylPREDNISolone (MEDROL) 4 MG tablet Medrol dose pack. Take as instructed 21 tablet 0   metoprolol tartrate (LOPRESSOR) 50 MG tablet TAKE ONE TABLET BY MOUTH TWICE DAILY @ 9AM & 5PM 180 tablet 3   oxybutynin (DITROPAN) 5 MG tablet TAKE 1 TABLET BY MOUTH 2 TO 3  TIMES DAILY FOR BLADDER SPASMS 180 tablet 5   PEG-KCl-NaCl-NaSulf-Na Asc-C (PLENVU) 140 g SOLR Take 140 g by mouth as directed. Manufacturer's coupon Universal coupon code:BIN: G6837245; GROUP: [id number]; PCN: [id number]; ID: [id number]; PAY NO MORE  $50 1 each 0   rosuvastatin (CRESTOR) 10 MG tablet Take 1 tablet (10 mg total) by mouth daily. 90 tablet 2   XARELTO 20 MG TABS tablet TAKE ONE TABLET BY MOUTH DAILY AT 5PM WITH SUPPER 90 tablet 11   No current facility-administered medications on file prior to visit.    Objective:   Vitals:   05/29/23 1058  BP: 111/76  Pulse: 82  Resp: 16  SpO2: 99%    Comprehensive ROS Pertinent positive and negative noted in HPI   Exam General appearance : Awake, alert, not in any distress. Speech Clear. Not toxic looking HEENT: Atraumatic and Normocephalic, pupils equally reactive to light and accomodation Neck: Supple, no JVD. No cervical lymphadenopathy.  Chest: Good air entry bilaterally, no added sounds  CVS: S1 S2 regular, no murmurs.  Abdomen: Bowel sounds present, Non tender and not distended with no gaurding, rigidity or rebound. Extremities: B/L Lower Ext shows no edema, both legs are warm to touch Neurology: Awake alert, and oriented X 3, CN II-XII intact, Non focal Skin: No Rash  Data Review Lab Results  Component Value Date   HGBA1C 6.8 05/29/2023   HGBA1C 7.3 (A) 02/09/2023   HGBA1C 7.7 (A) 11/07/2022    Assessment & Plan   Deysy was seen today for diabetes.  Diagnoses and all orders for this visit:  Type 2 diabetes mellitus with hyperosmolarity without coma, with long-term current use of insulin (HCC) A1C improved to 6.8  from  7.3 monitor carbohydrates -rice, potatoes, tortillas, breads, pasta, sweets, sodas.  Increase exercising to help maintain appropriate weight.  A1C is acceptable for TKR -     POCT glycosylated hemoglobin (Hb A1C) -     Microalbumin / creatinine urine ratio  Estrogen deficiency -     DG Bone Density; Future  Mixed hyperlipidemia Your cholesterol is high, Increase risk of heart attack and/or stroke.  To reduce your Cholesterol , Remember - more fruits and vegetables, more fish, and limit red meat and dairy products.  More soy, nuts,  beans, barley, lentils, oats and plant sterol ester enriched margarine instead of butter.   -     Lipid Panel  Colon cancer screening -     Ambulatory referral to Gastroenterology     Patient have been counseled extensively about nutrition and exercise. Other issues discussed during this visit include: low cholesterol diet, weight control and daily exercise, foot care, annual eye examinations at Ophthalmology, importance of adherence with medications and regular follow-up. We also discussed long term complications of uncontrolled diabetes and hypertension.  Return in about 3 months (around 08/29/2023).  The patient was given clear instructions to go to ER or return to medical center if symptoms don't improve, worsen or new problems develop. The patient verbalized understanding. The patient was told to call to get lab results if they haven't heard anything in the next week.   This note has been created with Education officer, environmental. Any transcriptional errors are unintentional.   Grayce Sessions, NP 05/29/2023, 11:14 AM

## 2023-05-29 NOTE — Progress Notes (Signed)
To Whom It May Concern,   I am writing to request permission for my patient, Rhonda Hickman  to have a support animal in their residence. I believe that having a support animal will greatly benefit their mental health and well-being.    Shacoya Cregg has been diagnosed with anxiety disorder and depression, a condition that significantly impacts their daily life. Anxiety disorders can cause persistent and excessive worry, fear, and apprehension, making it challenging for individuals to cope with everyday stressors.    Research has shown that support animals can provide emotional support and companionship, which can help alleviate symptoms of anxiety. The presence of a support animal has been shown to reduce stress levels, promote relaxation, and improve overall mood. She having a support animal would offer valuable emotional support and help them better manage their anxiety symptoms.   I can confirm that Rhonda Hickman has a legitimate need for a support animal as part of their treatment plan for anxiety. As their healthcare provider, I recommend the presence of a support animal as an essential component of their therapy. Having a support animal would contribute to [employer]  overall well-being and ability to function effectively in their living environment.   I kindly request that you consider accommodating request for a support animal in accordance with fair housing laws and regulations. If you require any further information or documentation, please do not hesitate to contact me.   Thank you for your attention to this matter. Your cooperation in providing a supportive living environment for Rhonda Hickman  is greatly appreciated.

## 2023-05-30 LAB — LIPID PANEL
Chol/HDL Ratio: 3.3 ratio (ref 0.0–4.4)
Cholesterol, Total: 119 mg/dL (ref 100–199)
HDL: 36 mg/dL — ABNORMAL LOW (ref >39)
LDL Chol Calc (NIH): 63 mg/dL (ref 0–99)
Triglycerides: 109 mg/dL (ref 0–149)
VLDL Cholesterol Cal: 20 mg/dL (ref 5–40)

## 2023-05-30 NOTE — Telephone Encounter (Signed)
Message received from [employer] and [employer] stating that the patient is scheduled to receive her power wheelchair today at 1600 and she is aware of this plan

## 2023-05-31 ENCOUNTER — Telehealth (INDEPENDENT_AMBULATORY_CARE_PROVIDER_SITE_OTHER): Payer: Self-pay | Admitting: Primary Care

## 2023-05-31 NOTE — Telephone Encounter (Signed)
Medication Refill -  Most Recent Primary Care Visit:  Provider: Grayce Sessions  Department: [facility]  Visit Type: OFFICE VISIT  Date: 05/29/2023  Medication: metFORMIN (GLUCOPHAGE) 1000 MG tablet   Has the patient contacted their pharmacy? Yes  Is this the correct pharmacy for this prescription? Yes  This is the patient's preferred pharmacy: [facility] - [place] - [address] [place] [address] Phone: [phone] Fax: [phone]  Has the prescription been filled recently? Yes  Is the patient out of the medication? Yes  Has the patient been seen for an appointment in the last year OR does the patient have an upcoming appointment? Yes  Can we respond through MyChart? No  Agent: Please be advised that Rx refills may take up to 3 business days. We ask that you follow-up with your pharmacy.

## 2023-05-31 NOTE — Telephone Encounter (Signed)
[facility] called and spoke to [place], Customer Service Rep about the refill(s) metformin requested. Advised it was sent on 05/22/23 #180/1 refill(s). She says they have it on file and to disregard this call request.

## 2023-06-12 ENCOUNTER — Ambulatory Visit: Payer: 59

## 2023-06-12 ENCOUNTER — Telehealth: Payer: Self-pay | Admitting: Orthopaedic Surgery

## 2023-06-12 NOTE — Telephone Encounter (Signed)
Patient called. Would like to know what medication she needs to stop talking before surgery? Her cb# [phone]

## 2023-06-13 NOTE — Telephone Encounter (Signed)
I called patient to advise needs to stop Ozempic and Xarelto as well as dates to stop taking.  ~~~~~~~~Can patient have bilateral shoulder injections while under anesthesia for TKA?

## 2023-06-15 NOTE — Progress Notes (Signed)
Surgical Instructions   Your procedure is scheduled on Tuesday June 20, 2023. Report to [facility] Main Entrance "A" at 9:25 A.M., then check in with the Admitting office. Any questions or running late day of surgery: call [phone]  Questions prior to your surgery date: call [phone], Monday-Friday, 8am-4pm. If you experience any cold or flu symptoms such as cough, fever, chills, shortness of breath, etc. between now and your scheduled surgery, please notify us at the above number.     Remember:  Do not eat after midnight the night before your surgery  You may drink clear liquids until 8:25 the morning of your surgery.   Clear liquids allowed are: Water, Non-Citrus Juices (without pulp), Carbonated Beverages, Clear Tea (no milk, honey, etc.), Black Coffee Only (NO MILK, CREAM OR POWDERED CREAMER of any kind), and Gatorade.  Patient Instructions  The night before surgery:  No food after midnight. ONLY clear liquids after midnight  The day of surgery (if you have diabetes): Drink ONE (1) 12 oz G2 given to you in your pre admission testing appointment by 8:25 the morning of surgery. Drink in one sitting. Do not sip.  This drink was given to you during your hospital  pre-op appointment visit.  Nothing else to drink after completing the  12 oz bottle of G2.         If you have questions, please contact your surgeon's office.     Take these medicines the morning of surgery with A SIP OF WATER  amLODipine (NORVASC)  buPROPion (WELLBUTRIN SR)  divalproex (DEPAKOTE ER)  metoprolol tartrate (LOPRESSOR)  rosuvastatin (CRESTOR)   May take these medicines IF NEEDED: HYDROcodone-acetaminophen (NORCO)  oxybutynin (DITROPAN)  SYMBICORT 160-4.5 MCG/ACT inhaler   PER YOUR CARDIOLOGIST'S INSTRUCTIONS, HOLD YOUR XARELTO 2 DAYS PRIOR TO SURGERY, WITH THE LAST DOSE BEING 06/17/2023.    One week prior to surgery, STOP taking any Aspirin (unless otherwise instructed by your  surgeon) Aleve, Naproxen, Ibuprofen, Motrin, Advil, Goody's, BC's, all herbal medications, fish oil, and non-prescription vitamins.   WHAT DO I DO ABOUT MY DIABETES MEDICATION?   Do not take oral diabetes medicines (pills) the morning of surgery.        STOP TAKING YOUR Semaglutide,0.25 or 0.5MG /DOS, (OZEMPIC) 7 DAYS PRIOR TO SURGERY, WITH THE LAST DOSE BEING NO LATER THAN 06/12/2023.           DO NOT TAKE YOUR JANUVIA THE MORNING OF SURGERY.          DO NOT TAKE YOUR metFORMIN (GLUCOPHAGE) THE MORNING OF SURGERY.    THE NIGHT BEFORE SURGERY, take 12 UNITS OF LANTUS SOLOSTAR, WHICH IS 50% OF YOUR REGULAR DOSE.      THE MORNING OF SURGERY, take 12 UNITS OF LANTUS SOLOSTAR, WHICH IS 50% OF YOUR REGULAR DOSE.         DO NOT TAKE YOUR insulin lispro (HUMALOG) THE NIGHT BEFORE OR THE MORNING OF SURGERY.If your CBG is greater than 220 mg/dL, you may take  of your sliding scale (correction) dose of insulin.   The day of surgery, do not take other diabetes injectables, including Byetta (exenatide), Bydureon (exenatide ER), Victoza (liraglutide), or Trulicity (dulaglutide).     HOW TO MANAGE YOUR DIABETES BEFORE AND AFTER SURGERY  Why is it important to control my blood sugar before and after surgery? Improving blood sugar levels before and after surgery helps healing and can limit problems. A way of improving blood sugar control is eating a healthy diet by:  Eating less sugar and carbohydrates  Increasing activity/exercise  Talking with your doctor about reaching your blood sugar goals High blood sugars (greater than 180 mg/dL) can raise your risk of infections and slow your recovery, so you will need to focus on controlling your diabetes during the weeks before surgery. Make sure that the doctor who takes care of your diabetes knows about your planned surgery including the date and location.  How do I manage my blood sugar before surgery? Check your blood sugar at least 4 times a day,  starting 2 days before surgery, to make sure that the level is not too high or low.  Check your blood sugar the morning of your surgery when you wake up and every 2 hours until you get to the Short Stay unit.  If your blood sugar is less than 70 mg/dL, you will need to treat for low blood sugar: Do not take insulin. Treat a low blood sugar (less than 70 mg/dL) with  cup of clear juice (cranberry or apple), 4 glucose tablets, OR glucose gel. Recheck blood sugar in 15 minutes after treatment (to make sure it is greater than 70 mg/dL). If your blood sugar is not greater than 70 mg/dL on recheck, call [phone] for further instructions. Report your blood sugar to the short stay nurse when you get to Short Stay.  If you are admitted to the hospital after surgery: Your blood sugar will be checked by the staff and you will probably be given insulin after surgery (instead of oral diabetes medicines) to make sure you have good blood sugar levels. The goal for blood sugar control after surgery is 80-180 mg/dL.                      Do NOT Smoke (Tobacco/Vaping) for 24 hours prior to your procedure.  If you use a CPAP at night, you may bring your mask/headgear for your overnight stay.   You will be asked to remove any contacts, glasses, piercing's, hearing aid's, dentures/partials prior to surgery. Please bring cases for these items if needed.    Patients discharged the day of surgery will not be allowed to drive home, and someone needs to stay with them for 24 hours.  SURGICAL WAITING ROOM VISITATION Patients may have no more than 2 support people in the waiting area - these visitors may rotate.   Pre-op nurse will coordinate an appropriate time for 1 ADULT support person, who may not rotate, to accompany patient in pre-op.  Children under the age of 36 must have an adult with them who is not the patient and must remain in the main waiting area with an adult.  If the patient needs to stay at  the hospital during part of their recovery, the visitor guidelines for inpatient rooms apply.  Please refer to the [facility] website for the visitor guidelines for any additional information.   If you received a COVID test during your pre-op visit  it is requested that you wear a mask when out in public, stay away from anyone that may not be feeling well and notify your surgeon if you develop symptoms. If you have been in contact with anyone that has tested positive in the last 10 days please notify you surgeon.      Pre-operative 5 CHG Bathing Instructions   You can play a key role in reducing the risk of infection after surgery. Your skin needs to be as free of germs as  possible. You can reduce the number of germs on your skin by washing with CHG (chlorhexidine gluconate) soap before surgery. CHG is an antiseptic soap that kills germs and continues to kill germs even after washing.   DO NOT use if you have an allergy to chlorhexidine/CHG or antibacterial soaps. If your skin becomes reddened or irritated, stop using the CHG and notify one of our RNs at [phone].   Please shower with the CHG soap starting 4 days before surgery using the following schedule:     Please keep in mind the following:  DO NOT shave, including legs and underarms, starting the day of your first shower.   You may shave your face at any point before/day of surgery.  Place clean sheets on your bed the day you start using CHG soap. Use a clean washcloth (not used since being washed) for each shower. DO NOT sleep with pets once you start using the CHG.   CHG Shower Instructions:  Wash your face and private area with normal soap. If you choose to wash your hair, wash first with your normal shampoo.  After you use shampoo/soap, rinse your hair and body thoroughly to remove shampoo/soap residue.  Turn the water OFF and apply about 3 tablespoons (45 ml) of CHG soap to a CLEAN washcloth.  Apply CHG soap ONLY FROM  YOUR NECK DOWN TO YOUR TOES (washing for 3-5 minutes)  DO NOT use CHG soap on face, private areas, open wounds, or sores.  Pay special attention to the area where your surgery is being performed.  If you are having back surgery, having someone wash your back for you may be helpful. Wait 2 minutes after CHG soap is applied, then you may rinse off the CHG soap.  Pat dry with a clean towel  Put on clean clothes/pajamas   If you choose to wear lotion, please use ONLY the CHG-compatible lotions on the back of this paper.   Additional instructions for the day of surgery: DO NOT APPLY any lotions, deodorants or perfumes.   Do not bring valuables to the hospital. [facility] is not responsible for any belongings/valuables. Do not wear nail polish, gel polish, artificial nails, or any other type of covering on natural nails (fingers and toes) Do not wear jewelry or makeup Put on clean/comfortable clothes.  Please brush your teeth.  Ask your nurse before applying any prescription medications to the skin.     CHG Compatible Lotions   Aveeno Moisturizing lotion  Cetaphil Moisturizing Cream  Cetaphil Moisturizing Lotion  Clairol Herbal Essence Moisturizing Lotion, Dry Skin  Clairol Herbal Essence Moisturizing Lotion, Extra Dry Skin  Clairol Herbal Essence Moisturizing Lotion, Normal Skin  Curel Age Defying Therapeutic Moisturizing Lotion with Alpha Hydroxy  Curel Extreme Care Body Lotion  Curel Soothing Hands Moisturizing Hand Lotion  Curel Therapeutic Moisturizing Cream, Fragrance-Free  Curel Therapeutic Moisturizing Lotion, Fragrance-Free  Curel Therapeutic Moisturizing Lotion, Original Formula  Eucerin Daily Replenishing Lotion  Eucerin Dry Skin Therapy Plus Alpha Hydroxy Crme  Eucerin Dry Skin Therapy Plus Alpha Hydroxy Lotion  Eucerin Original Crme  Eucerin Original Lotion  Eucerin Plus Crme Eucerin Plus Lotion  Eucerin TriLipid Replenishing Lotion  Keri Anti-Bacterial Hand  Lotion  Keri Deep Conditioning Original Lotion Dry Skin Formula Softly Scented  Keri Deep Conditioning Original Lotion, Fragrance Free Sensitive Skin Formula  Keri Lotion Fast Absorbing Fragrance Free Sensitive Skin Formula  Keri Lotion Fast Absorbing Softly Scented Dry Skin Formula  Keri Original Lotion  Keri Skin  Renewal Lotion WellPoint Smooth Lotion  Keri Silky Smooth Sensitive Skin Lotion  Nivea Body Creamy Conditioning Oil  Nivea Body Extra Enriched Teacher, adult education Moisturizing Lotion Nivea Crme  Nivea Skin Firming Lotion  NutraDerm 30 Skin Lotion  NutraDerm Skin Lotion  NutraDerm Therapeutic Skin Cream  NutraDerm Therapeutic Skin Lotion  ProShield Protective Hand Cream  Provon moisturizing lotion  Please read over the following fact sheets that you were given.

## 2023-06-16 ENCOUNTER — Other Ambulatory Visit: Payer: Self-pay

## 2023-06-16 ENCOUNTER — Encounter (HOSPITAL_COMMUNITY)
Admission: RE | Admit: 2023-06-16 | Discharge: 2023-06-16 | Disposition: A | Discharge status: Home / Self Care | Payer: 59 | Source: Ambulatory Visit | Attending: Orthopaedic Surgery | Admitting: Orthopaedic Surgery

## 2023-06-16 ENCOUNTER — Encounter (HOSPITAL_COMMUNITY): Payer: Self-pay

## 2023-06-16 VITALS — BP 125/78 | HR 80 | Temp 98.0°F | Resp 18 | Ht 72.0 in | Wt 265.0 lb

## 2023-06-16 DIAGNOSIS — J45909 Unspecified asthma, uncomplicated: Secondary | ICD-10-CM | POA: Insufficient documentation

## 2023-06-16 DIAGNOSIS — F319 Bipolar disorder, unspecified: Secondary | ICD-10-CM | POA: Diagnosis not present

## 2023-06-16 DIAGNOSIS — E119 Type 2 diabetes mellitus without complications: Secondary | ICD-10-CM | POA: Insufficient documentation

## 2023-06-16 DIAGNOSIS — I4891 Unspecified atrial fibrillation: Secondary | ICD-10-CM | POA: Diagnosis not present

## 2023-06-16 DIAGNOSIS — G4733 Obstructive sleep apnea (adult) (pediatric): Secondary | ICD-10-CM | POA: Insufficient documentation

## 2023-06-16 DIAGNOSIS — M1712 Unilateral primary osteoarthritis, left knee: Secondary | ICD-10-CM | POA: Insufficient documentation

## 2023-06-16 DIAGNOSIS — K219 Gastro-esophageal reflux disease without esophagitis: Secondary | ICD-10-CM | POA: Insufficient documentation

## 2023-06-16 DIAGNOSIS — Z01818 Encounter for other preprocedural examination: Secondary | ICD-10-CM

## 2023-06-16 DIAGNOSIS — Z7984 Long term (current) use of oral hypoglycemic drugs: Secondary | ICD-10-CM | POA: Insufficient documentation

## 2023-06-16 DIAGNOSIS — Z8673 Personal history of transient ischemic attack (TIA), and cerebral infarction without residual deficits: Secondary | ICD-10-CM | POA: Diagnosis not present

## 2023-06-16 DIAGNOSIS — Z7901 Long term (current) use of anticoagulants: Secondary | ICD-10-CM | POA: Diagnosis not present

## 2023-06-16 DIAGNOSIS — F17200 Nicotine dependence, unspecified, uncomplicated: Secondary | ICD-10-CM | POA: Insufficient documentation

## 2023-06-16 DIAGNOSIS — Z01812 Encounter for preprocedural laboratory examination: Secondary | ICD-10-CM | POA: Diagnosis not present

## 2023-06-16 DIAGNOSIS — J449 Chronic obstructive pulmonary disease, unspecified: Secondary | ICD-10-CM | POA: Diagnosis not present

## 2023-06-16 HISTORY — DX: Unspecified osteoarthritis, unspecified site: M19.90

## 2023-06-16 HISTORY — DX: Gastro-esophageal reflux disease without esophagitis: K21.9

## 2023-06-16 LAB — BASIC METABOLIC PANEL
Anion gap: 10 (ref 5–15)
BUN: 7 mg/dL — ABNORMAL LOW (ref 8–23)
CO2: 22 mmol/L (ref 22–32)
Calcium: 9.1 mg/dL (ref 8.9–10.3)
Chloride: 109 mmol/L (ref 98–111)
Creatinine, Ser: 0.92 mg/dL (ref 0.44–1.00)
GFR, Estimated: >60 mL/min (ref >60)
Glucose, Bld: 124 mg/dL — ABNORMAL HIGH (ref 70–99)
Potassium: 3.9 mmol/L (ref 3.5–5.1)
Sodium: 141 mmol/L (ref 135–145)

## 2023-06-16 LAB — CBC
HCT: 38 % (ref 36.0–46.0)
Hemoglobin: 12.1 g/dL (ref 12.0–15.0)
MCH: 27.3 pg (ref 26.0–34.0)
MCHC: 31.8 g/dL (ref 30.0–36.0)
MCV: 85.8 fL (ref 80.0–100.0)
Platelets: 254 10*3/uL (ref 150–400)
RBC: 4.43 MIL/uL (ref 3.87–5.11)
RDW: 14.5 % (ref 11.5–15.5)
WBC: 7.3 10*3/uL (ref 4.0–10.5)
nRBC: 0 % (ref 0.0–0.2)

## 2023-06-16 LAB — SURGICAL PCR SCREEN
MRSA, PCR: NEGATIVE
Staphylococcus aureus: NEGATIVE

## 2023-06-16 LAB — GLUCOSE, CAPILLARY: Glucose-Capillary: 129 mg/dL — ABNORMAL HIGH (ref 70–99)

## 2023-06-16 NOTE — Progress Notes (Signed)
PCP - Gwinda Passe, NP Cardiologist - Dr. Leatrice Jewels  PPM/ICD - denies Device Orders - na Rep Notified - na  Chest x-ray - na EKG - 04/19/2023 Stress Test - 01/03/2020 ECHO - 08/26/2022 Cardiac Cath -   Sleep Study - denies CPAP - na  Type II diabetic. Blood sugar 129 at PAT appointment Fasting Blood Sugar - less than 150 Checks Blood Sugar: twice a day.  Last dose of GLP1 agonist-  Ozempic, states last dose was 11/302024  Blood Thinner Instructions:Xarelto, states last dose was 06/15/2023 Aspirin Instructions:denies  ERAS Protcol -with G2 until 0825  COVID TEST- na  Anesthesia review: Yes. Stroke, DM, COPD, A-fib  Patient denies shortness of breath, fever, cough and chest pain at PAT appointment   All instructions explained to the patient, with a verbal understanding of the material. Patient agrees to go over the instructions while at home for a better understanding. Patient also instructed to self quarantine after being tested for COVID-19. The opportunity to ask questions was provided.

## 2023-06-19 ENCOUNTER — Other Ambulatory Visit (INDEPENDENT_AMBULATORY_CARE_PROVIDER_SITE_OTHER): Payer: Self-pay | Admitting: Primary Care

## 2023-06-19 DIAGNOSIS — Z760 Encounter for issue of repeat prescription: Secondary | ICD-10-CM

## 2023-06-19 DIAGNOSIS — J449 Chronic obstructive pulmonary disease, unspecified: Secondary | ICD-10-CM

## 2023-06-19 NOTE — H&P (Signed)
TOTAL KNEE ADMISSION H&P  Patient is being admitted for left total knee arthroplasty.  Subjective:  Chief Complaint:left knee pain.  HPI: Rhonda Hickman, 67 y.o. female, has a history of pain and functional disability in the left knee due to arthritis and has failed non-surgical conservative treatments for greater than 12 weeks to includeNSAID's and/or analgesics, corticosteriod injections, flexibility and strengthening excercises, use of assistive devices, weight reduction as appropriate, and activity modification.  Onset of symptoms was gradual, starting 5 years ago with gradually worsening course since that time. The patient noted prior procedures on the knee to include  arthroscopy on the left knee(s).  Patient currently rates pain in the left knee(s) at 10 out of 10 with activity. Patient has night pain, worsening of pain with activity and weight bearing, pain that interferes with activities of daily living, pain with passive range of motion, and crepitus.  Patient has evidence of subchondral cysts, subchondral sclerosis, periarticular osteophytes, and joint space narrowing by imaging studies. There is no active infection.  Patient Active Problem List   Diagnosis Date Noted   Unilateral primary osteoarthritis, left knee 03/20/2023   Lumbar radiculopathy 02/01/2023   S/P left knee arthroscopy11/29/22 07/13/2021   Tear of meniscus of knee joint    Acute lower UTI 05/23/2021   A-fib (HCC) 05/22/2021   Leukocytosis 05/22/2021   Fall at home, initial encounter 05/22/2021   HLD (hyperlipidemia) 05/22/2021   Hypertension 03/18/2020   Bipolar 1 disorder (HCC)    Chronic atrial fibrillation (HCC) 12/03/2019   TIA (transient ischemic attack) 12/03/2019   Diabetic peripheral neuropathy (HCC) 12/03/2019   Chronic migraine 12/03/2019   Vertigo 08/03/2019   Bronchiectasis with acute exacerbation (HCC)    Obesity, Class III, BMI 40-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) (HCC)    Gastroesophageal reflux disease     Asthma exacerbation 06/06/2018   Gait abnormality 08/29/2017   Paresthesia 08/29/2017   Derangement of posterior horn of medial meniscus of right knee    Meniscus, lateral, derangement, right    Unilateral primary osteoarthritis, right knee    COPD with acute exacerbation (HCC) 03/09/2016   Type 2 diabetes mellitus (HCC) 02/16/2007   Cocaine abuse (HCC) 02/16/2007   Extrinsic asthma 02/16/2007   HOMELESSNESS, HX OF 02/16/2007   Past Medical History:  Diagnosis Date   Anemia    Anxiety    Arthritis    Asthma    Atrial fibrillation (HCC)    Bipolar 1 disorder (HCC)    Bulging lumbar disc    Chronic pain of left knee    COPD (chronic obstructive pulmonary disease) (HCC)    CVA (cerebral vascular accident) (HCC)    Diabetes mellitus    GERD (gastroesophageal reflux disease)    Neuropathy    TIA (transient ischemic attack)    Vertigo     Past Surgical History:  Procedure Laterality Date   ABDOMINAL HYSTERECTOMY     CARPAL TUNNEL RELEASE Right    CHOLECYSTECTOMY     KNEE ARTHROSCOPY WITH LATERAL MENISECTOMY Left 06/08/2021   Procedure: KNEE ARTHROSCOPY WITH LATERAL MENISCECTOMY AND MEDIAL MENISCECTOMY;  Surgeon: Vickki Hearing, MD;  Location: [facility];  Service: Orthopedics;  Laterality: Left;   KNEE ARTHROSCOPY WITH MEDIAL MENISECTOMY Right 04/14/2016   Procedure: KNEE ARTHROSCOPY WITH MEDIAL AND LATERAL MENISECTOMY, MICROFRACTURE REPAIR;  Surgeon: Vickki Hearing, MD;  Location: [facility];  Service: Orthopedics;  Laterality: Right;  lateral menisectomy - needs crutch training    No current facility-administered medications for this encounter.   Current  Outpatient Medications  Medication Sig Dispense Refill Last Dose   amLODipine (NORVASC) 5 MG tablet TAKE 1 TABLET BY MOUTH DAILY 80 tablet 3    buPROPion (WELLBUTRIN SR) 150 MG 12 hr tablet TAKE ONE TABLET BY MOUTH TWICE DAILY @ 9AM & 9PM 180 tablet 11    divalproex (DEPAKOTE ER) 500 MG 24 hr tablet Take 500-1,000 mg by  mouth See admin instructions. Take 500 mg in the morning and 1000 mg at bedtime      FEROSUL 325 (65 Fe) MG tablet TAKE ONE TABLET BY MOUTH DAILY AT 9AM WITH BREAKFAST 90 tablet 11    HYDROcodone-acetaminophen (NORCO) 10-325 MG tablet Take 1 tablet by mouth 4 (four) times daily as needed for moderate pain (pain score 4-6) or severe pain (pain score 7-10).      insulin lispro (HUMALOG) 100 UNIT/ML KwikPen Inject 20 Units into the skin 2 (two) times daily. (Patient taking differently: Inject 0-12 Units into the skin 2 (two) times daily as needed (If blood sugar is over 150). Sliding scale 0-150= None 151-200=2 201-250=4 251-300=6 301-350=8 351-400=10 401=12 units and contact doctor) 15 mL 3    JANUVIA 100 MG tablet Take 1 tablet (100 mg total) by mouth daily. 90 tablet 1    LANTUS SOLOSTAR 100 UNIT/ML Solostar Pen INJECT SUBCUTANEOUSLY 20 UNITS  TWICE DAILY (Patient taking differently: Inject 24 Units into the skin 2 (two) times daily.) 45 mL 2    metFORMIN (GLUCOPHAGE) 1000 MG tablet Take 1 tablet (1,000 mg total) by mouth 2 (two) times daily. TAKE ONE TABLET BY MOUTH TWICE DAILY @ 9AM & 5PM WITH MEALS 180 tablet 1    metoprolol tartrate (LOPRESSOR) 50 MG tablet TAKE ONE TABLET BY MOUTH TWICE DAILY @ 9AM & 5PM 180 tablet 3    oxybutynin (DITROPAN) 5 MG tablet TAKE 1 TABLET BY MOUTH 2 TO 3  TIMES DAILY FOR BLADDER SPASMS 180 tablet 5    rosuvastatin (CRESTOR) 10 MG tablet Take 1 tablet (10 mg total) by mouth daily. 90 tablet 2    Semaglutide,0.25 or 0.5MG /DOS, (OZEMPIC, 0.25 OR 0.5 MG/DOSE,) 2 MG/1.5ML SOPN Inject 0.5 mg into the skin once a week.      SYMBICORT 160-4.5 MCG/ACT inhaler Inhale 2 puffs into the lungs daily as needed (Asthma).      XARELTO 20 MG TABS tablet TAKE ONE TABLET BY MOUTH DAILY AT 5PM WITH SUPPER 90 tablet 11    ACCU-CHEK GUIDE test strip CHECK BLOOD SUGAR UP TO FOUR TIMES DAILY AS DIRECTED 100 strip 11    albuterol (VENTOLIN HFA) 108 (90 Base) MCG/ACT inhaler INHALE 2  PUFFS EVERY 6 HOURS AS NEEDED FOR WHEEZING OR SHORTNESS OF BREATH (Patient not taking: Reported on 06/13/2023) 18 g 1 Not Taking   blood glucose meter kit and supplies Dispense based on patient and insurance preference. Use up to four times daily as directed. (FOR ICD-10 E10.9, E11.9). 1 each 0    citalopram (CELEXA) 40 MG tablet TAKE ONE TABLET BY MOUTH DAILY AT 9AM (Patient not taking: Reported on 06/13/2023) 90 tablet 1 Not Taking   GNP ULTICARE PEN NEEDLES 32G X 4 MM MISC USE TO INJECT INSULIN UP TO 5 TIMES A DAY 200 each 6    Insulin Syringe-Needle U-100 (ADVOCATE INSULIN SYRINGE) 29G X 1/2" 0.3 ML MISC Use to inject insulin 5x daily. 200 each 6    lidocaine (LIDODERM) 5 % Place 1 patch onto the skin daily. Remove & Discard patch within 12 hours or as  directed by MD (Patient not taking: Reported on 06/13/2023) 30 patch 0 Not Taking   Allergies  Allergen Reactions   Aspirin Nausea Only and Other (See Comments)    Causes stomach pain    Social History   Tobacco Use   Smoking status: Some Days    Current packs/day: 0.50    Types: Cigarettes   Smokeless tobacco: Never   Tobacco comments:    tobacco info given  Substance Use Topics   Alcohol use: No    Family History  Problem Relation Age of Onset   Diabetes Mother    Heart attack Father      Review of Systems  Objective:  Physical Exam Vitals reviewed.  Constitutional:      Appearance: Normal appearance. She is obese.  HENT:     Head: Normocephalic and atraumatic.  Eyes:     Extraocular Movements: Extraocular movements intact.     Pupils: Pupils are equal, round, and reactive to light.  Cardiovascular:     Rate and Rhythm: Normal rate.     Pulses: Normal pulses.  Pulmonary:     Effort: Pulmonary effort is normal.     Breath sounds: Normal breath sounds.  Abdominal:     Palpations: Abdomen is soft.  Musculoskeletal:     Cervical back: Normal range of motion and neck supple.     Left knee: Effusion, bony tenderness  and crepitus present. Decreased range of motion. Tenderness present over the medial joint line and lateral joint line. Abnormal alignment.  Neurological:     Mental Status: She is alert and oriented to person, place, and time.  Psychiatric:        Behavior: Behavior normal.     Vital signs in last 24 hours:    Labs:   Estimated body mass index is 35.94 kg/m as calculated from the following:   Height as of 06/16/23: 6' (1.829 m).   Weight as of 06/16/23: 120.2 kg.   Imaging Review Plain radiographs demonstrate severe degenerative joint disease of the left knee(s). The overall alignment issignificant valgus. The bone quality appears to be good for age and reported activity level.      Assessment/Plan:  End stage arthritis, left knee   The patient history, physical examination, clinical judgment of the provider and imaging studies are consistent with end stage degenerative joint disease of the left knee(s) and total knee arthroplasty is deemed medically necessary. The treatment options including medical management, injection therapy arthroscopy and arthroplasty were discussed at length. The risks and benefits of total knee arthroplasty were presented and reviewed. The risks due to aseptic loosening, infection, stiffness, patella tracking problems, thromboembolic complications and other imponderables were discussed. The patient acknowledged the explanation, agreed to proceed with the plan and consent was signed. Patient is being admitted for inpatient treatment for surgery, pain control, PT, OT, prophylactic antibiotics, VTE prophylaxis, progressive ambulation and ADL's and discharge planning. The patient is planning to be discharged home with home health services

## 2023-06-19 NOTE — Progress Notes (Signed)
Anesthesia Chart Review:  67 year old follows cardiology for history of mild OSA not on CPAP, TIA, atrial fibrillation on Xarelto. TTE on 08/03/2019 showed LVEF 60-65%, normal RV function, normal biatrial size, no significant valvular disease.  Lexiscan Myoview on 01/03/2020 showed low risk stress test with breast attenuation but no ischemia, LVEF 48%.  Carotid duplex on 01/14/2020 showed bilateral 1 to 39% stenosis.  Zio patch x14 days on 01/29/2020 showed 1% atrial flutter burden, average heart rate 86 bpm.  Zio patch x3 days on 05/25/2020 showed no significant arrhythmias.  Echocardiogram 08/26/2022 showed normal biventricular function, grade 1 diastolic function, no significant valvular disease.  Coronary CTA on 11/03/2022 showed mild CAD (1 to 24% stenosis in proximal/mid LAD, D1, and proximal/mid RCA), calcium score 55 (80th percentile).  Last seen by Dr. Bjorn Pippin 04/19/2023 for preop evaluation.  Per note, "Preop evaluation: Prior to knee surgery.  Limited functional capacity, less than 4 METS.  Does report chest pain/dyspnea on exertion.  Underwent recent cardiac workup with echocardiogram 08/26/2022 showed normal biventricular function, grade 1 diastolic function, no significant valvular disease.  Coronary CTA on 11/03/2022 showed mild CAD (1 to 24% stenosis in proximal/mid LAD, D1, and proximal/mid RCA), calcium score 55 (80th percentile). -Given unremarkable echocardiogram and nonobstructive CAD on coronary CTA earlier this year, no further cardiac workup recommended prior to her surgery.  Would hold Xarelto for 2 days prior to surgery and restart once okay from surgical standpoint."  Other pertinent history includes bipolar 1 disorder, asthma, IDDM2 (A1c 6.8 on 05/29/2023), someday smoker with associated COPD, GERD.  Patient reports last dose Xarelto 06/15/2023 and last dose Ozempic 06/10/2023.  Preop labs reviewed, unremarkable.  EKG 04/19/2023: NSR.  Rate 73.  Left axis deviation.  Minimal voltage  criteria for LVH.  Coronary CTA 11/03/2022: IMPRESSION: 1.  Calcium score 54.5 which is 80 th percentile for age/sex   2.  Normal ascending thoracic aorta 3.6 cm   3.  CAD RADS 1 non obstructive CAD see description above   4.  Lipomatous hypertrophy of the atrial septum  TTE 08/26/2022: 1. Left ventricular ejection fraction, by estimation, is 55 to 60%. The  left ventricle has normal function. The left ventricle has no regional  wall motion abnormalities. There is mild concentric left ventricular  hypertrophy. Left ventricular diastolic  parameters are consistent with Grade I diastolic dysfunction (impaired  relaxation).   2. Right ventricular systolic function is normal. The right ventricular  size is normal.   3. Left atrial size was mildly dilated.   4. The mitral valve is normal in structure. Trivial mitral valve  regurgitation. No evidence of mitral stenosis.   5. The aortic valve is tricuspid. Aortic valve regurgitation is not  visualized. No aortic stenosis is present.   6. The inferior vena cava is normal in size with greater than 50%  respiratory variability, suggesting right atrial pressure of 3 mmHg.    Zannie Cove [facility] Short Stay Center/Anesthesiology Phone [phone] 06/19/2023 12:00 PM

## 2023-06-19 NOTE — Telephone Encounter (Signed)
I called and advised patient.

## 2023-06-19 NOTE — Anesthesia Preprocedure Evaluation (Signed)
Anesthesia Evaluation  Patient identified by MRN, date of birth, ID band Patient awake    Reviewed: Allergy & Precautions, NPO status , Patient's Chart, lab work & pertinent test results, reviewed documented beta blocker date and time   Airway Mallampati: II  TM Distance: >3 FB Neck ROM: Full    Dental  (+) Dental Advisory Given, Edentulous Upper, Edentulous Lower   Pulmonary asthma , COPD, Current Smoker and Patient abstained from smoking.   Pulmonary exam normal breath sounds clear to auscultation       Cardiovascular hypertension, Pt. on medications and Pt. on home beta blockers Normal cardiovascular exam+ dysrhythmias Atrial Fibrillation  Rhythm:Regular Rate:Normal     Neuro/Psych  Headaches PSYCHIATRIC DISORDERS Anxiety  Bipolar Disorder   TIA Neuromuscular disease CVA, Residual Symptoms    GI/Hepatic Neg liver ROS,GERD  ,,  Endo/Other  diabetes, Type 2, Insulin Dependent, Oral Hypoglycemic Agents  Class 3 obesity  Renal/GU negative Renal ROS     Musculoskeletal  (+) Arthritis ,    Abdominal   Peds  Hematology  (+) Blood dyscrasia (Xarelto-3 days ago) Plt 254k   Anesthesia Other Findings Day of surgery medications reviewed with the patient.  Reproductive/Obstetrics                              Anesthesia Physical Anesthesia Plan  ASA: 3  Anesthesia Plan: General   Post-op Pain Management: Tylenol PO (pre-op)* and Regional block*   Induction: Intravenous  PONV Risk Score and Plan: 2 and Midazolam, Dexamethasone and Ondansetron  Airway Management Planned: Oral ETT  Additional Equipment:   Intra-op Plan:   Post-operative Plan: Extubation in OR  Informed Consent: I have reviewed the patients History and Physical, chart, labs and discussed the procedure including the risks, benefits and alternatives for the proposed anesthesia with the patient or authorized representative who  has indicated his/her understanding and acceptance.     Dental advisory given  Plan Discussed with: CRNA  Anesthesia Plan Comments: (PAT note by Antionette Poles, PA-C: 67 year old follows cardiology for history of mild OSA not on CPAP, TIA, atrial fibrillation on Xarelto. TTE on 08/03/2019 showed LVEF 60-65%, normal RV function, normal biatrial size, no significant valvular disease.  Lexiscan Myoview on 01/03/2020 showed low risk stress test with breast attenuation but no ischemia, LVEF 48%.  Carotid duplex on 01/14/2020 showed bilateral 1 to 39% stenosis.  Zio patch x14 days on 01/29/2020 showed 1% atrial flutter burden, average heart rate 86 bpm.  Zio patch x3 days on 05/25/2020 showed no significant arrhythmias.  Echocardiogram 08/26/2022 showed normal biventricular function, grade 1 diastolic function, no significant valvular disease.  Coronary CTA on 11/03/2022 showed mild CAD (1 to 24% stenosis in proximal/mid LAD, D1, and proximal/mid RCA), calcium score 55 (80th percentile).  Last seen by Dr. Bjorn Pippin 04/19/2023 for preop evaluation.  Per note, "Preop evaluation: Prior to knee surgery.  Limited functional capacity, less than 4 METS.  Does report chest pain/dyspnea on exertion.  Underwent recent cardiac workup with echocardiogram 08/26/2022 showed normal biventricular function, grade 1 diastolic function, no significant valvular disease.  Coronary CTA on 11/03/2022 showed mild CAD (1 to 24% stenosis in proximal/mid LAD, D1, and proximal/mid RCA), calcium score 55 (80th percentile). -Given unremarkable echocardiogram and nonobstructive CAD on coronary CTA earlier this year, no further cardiac workup recommended prior to her surgery.  Would hold Xarelto for 2 days prior to surgery and restart once okay from surgical standpoint."  Other pertinent history includes bipolar 1 disorder, asthma, IDDM2 (A1c 6.8 on 05/29/2023), someday smoker with associated COPD, GERD.  Patient reports last dose Xarelto 06/15/2023 and  last dose Ozempic 06/10/2023.  Preop labs reviewed, unremarkable.  EKG 04/19/2023: NSR.  Rate 73.  Left axis deviation.  Minimal voltage criteria for LVH.  Coronary CTA 11/03/2022: IMPRESSION: 1.  Calcium score 54.5 which is 80 th percentile for age/sex   2.  Normal ascending thoracic aorta 3.6 cm   3.  CAD RADS 1 non obstructive CAD see description above   4.  Lipomatous hypertrophy of the atrial septum  TTE 08/26/2022: 1. Left ventricular ejection fraction, by estimation, is 55 to 60%. The  left ventricle has normal function. The left ventricle has no regional  wall motion abnormalities. There is mild concentric left ventricular  hypertrophy. Left ventricular diastolic  parameters are consistent with Grade I diastolic dysfunction (impaired  relaxation).   2. Right ventricular systolic function is normal. The right ventricular  size is normal.   3. Left atrial size was mildly dilated.   4. The mitral valve is normal in structure. Trivial mitral valve  regurgitation. No evidence of mitral stenosis.   5. The aortic valve is tricuspid. Aortic valve regurgitation is not  visualized. No aortic stenosis is present.   6. The inferior vena cava is normal in size with greater than 50%  respiratory variability, suggesting right atrial pressure of 3 mmHg.   )         Anesthesia Quick Evaluation

## 2023-06-20 ENCOUNTER — Ambulatory Visit (HOSPITAL_COMMUNITY): Payer: 59 | Admitting: Physician Assistant

## 2023-06-20 ENCOUNTER — Observation Stay (HOSPITAL_COMMUNITY): Payer: 59

## 2023-06-20 ENCOUNTER — Telehealth: Payer: Self-pay

## 2023-06-20 ENCOUNTER — Encounter (HOSPITAL_COMMUNITY)
Admission: RE | Disposition: A | Discharge status: Home / Self Care | Payer: Self-pay | Source: Home / Self Care | Attending: Orthopaedic Surgery

## 2023-06-20 ENCOUNTER — Observation Stay (HOSPITAL_COMMUNITY)
Admission: RE | Admit: 2023-06-20 | Discharge: 2023-06-22 | Disposition: A | Discharge status: Home / Self Care | Payer: 59 | Attending: Orthopaedic Surgery | Admitting: Orthopaedic Surgery

## 2023-06-20 ENCOUNTER — Ambulatory Visit (HOSPITAL_COMMUNITY): Payer: 59 | Admitting: Anesthesiology

## 2023-06-20 DIAGNOSIS — M1712 Unilateral primary osteoarthritis, left knee: Secondary | ICD-10-CM

## 2023-06-20 DIAGNOSIS — Z7901 Long term (current) use of anticoagulants: Secondary | ICD-10-CM | POA: Diagnosis not present

## 2023-06-20 DIAGNOSIS — Z7984 Long term (current) use of oral hypoglycemic drugs: Secondary | ICD-10-CM | POA: Diagnosis not present

## 2023-06-20 DIAGNOSIS — Z96652 Presence of left artificial knee joint: Secondary | ICD-10-CM

## 2023-06-20 DIAGNOSIS — Z79899 Other long term (current) drug therapy: Secondary | ICD-10-CM | POA: Insufficient documentation

## 2023-06-20 DIAGNOSIS — Z01818 Encounter for other preprocedural examination: Secondary | ICD-10-CM

## 2023-06-20 DIAGNOSIS — E119 Type 2 diabetes mellitus without complications: Secondary | ICD-10-CM | POA: Diagnosis not present

## 2023-06-20 DIAGNOSIS — J449 Chronic obstructive pulmonary disease, unspecified: Secondary | ICD-10-CM | POA: Diagnosis not present

## 2023-06-20 DIAGNOSIS — M179 Osteoarthritis of knee, unspecified: Secondary | ICD-10-CM | POA: Diagnosis present

## 2023-06-20 DIAGNOSIS — F17210 Nicotine dependence, cigarettes, uncomplicated: Secondary | ICD-10-CM | POA: Insufficient documentation

## 2023-06-20 DIAGNOSIS — I482 Chronic atrial fibrillation, unspecified: Secondary | ICD-10-CM | POA: Diagnosis not present

## 2023-06-20 DIAGNOSIS — Z794 Long term (current) use of insulin: Secondary | ICD-10-CM | POA: Diagnosis not present

## 2023-06-20 DIAGNOSIS — I10 Essential (primary) hypertension: Secondary | ICD-10-CM | POA: Insufficient documentation

## 2023-06-20 DIAGNOSIS — Z8673 Personal history of transient ischemic attack (TIA), and cerebral infarction without residual deficits: Secondary | ICD-10-CM | POA: Insufficient documentation

## 2023-06-20 HISTORY — PX: TOTAL KNEE ARTHROPLASTY: SHX125

## 2023-06-20 LAB — GLUCOSE, CAPILLARY
Glucose-Capillary: 108 mg/dL — ABNORMAL HIGH (ref 70–99)
Glucose-Capillary: 120 mg/dL — ABNORMAL HIGH (ref 70–99)
Glucose-Capillary: 59 mg/dL — ABNORMAL LOW (ref 70–99)
Glucose-Capillary: 190 mg/dL — ABNORMAL HIGH (ref 70–99)
Glucose-Capillary: 173 mg/dL — ABNORMAL HIGH (ref 70–99)

## 2023-06-20 LAB — HEMOGLOBIN A1C
Hgb A1c MFr Bld: 6.8 % — ABNORMAL HIGH (ref 4.8–5.6)
Mean Plasma Glucose: 148.46 mg/dL

## 2023-06-20 SURGERY — ARTHROPLASTY, KNEE, TOTAL
Anesthesia: General | Site: Knee | Laterality: Left

## 2023-06-20 MED ORDER — METHOCARBAMOL 500 MG PO TABS
500.0000 mg | ORAL_TABLET | Freq: Four times a day (QID) | ORAL | Status: DC | PRN
  Administered 2023-06-21 (×2): 500 mg via ORAL
  Filled 2023-06-20 (×2): qty 1

## 2023-06-20 MED ORDER — AMLODIPINE BESYLATE 5 MG PO TABS
5.0000 mg | ORAL_TABLET | Freq: Every day | ORAL | Status: DC
  Administered 2023-06-21: 5 mg via ORAL
  Filled 2023-06-20: qty 1

## 2023-06-20 MED ORDER — ONDANSETRON HCL 4 MG PO TABS: 4.0000 mg | ORAL_TABLET | Freq: Four times a day (QID) | ORAL | Status: DC | PRN

## 2023-06-20 MED ORDER — DEXMEDETOMIDINE HCL IN NACL 80 MCG/20ML IV SOLN
INTRAVENOUS | Status: DC | PRN
  Administered 2023-06-20: 8 ug via INTRAVENOUS
  Administered 2023-06-20: 4 ug via INTRAVENOUS
  Administered 2023-06-20: 8 ug via INTRAVENOUS

## 2023-06-20 MED ORDER — HYDROMORPHONE HCL 1 MG/ML IJ SOLN
INTRAMUSCULAR | Status: DC | PRN
  Administered 2023-06-20: 1 mg via INTRAVENOUS

## 2023-06-20 MED ORDER — FENTANYL CITRATE (PF) 250 MCG/5ML IJ SOLN
INTRAMUSCULAR | Status: DC | PRN
  Administered 2023-06-20 (×2): 100 ug via INTRAVENOUS
  Administered 2023-06-20: 50 ug via INTRAVENOUS

## 2023-06-20 MED ORDER — FENTANYL CITRATE (PF) 250 MCG/5ML IJ SOLN
INTRAMUSCULAR | Status: AC
  Filled 2023-06-20: qty 5

## 2023-06-20 MED ORDER — INSULIN ASPART 100 UNIT/ML IJ SOLN: 0.0000 [IU] | INTRAMUSCULAR | Status: DC | PRN

## 2023-06-20 MED ORDER — SODIUM CHLORIDE 0.9 % IR SOLN
Status: DC | PRN
  Administered 2023-06-20: 1000 mL

## 2023-06-20 MED ORDER — BUPROPION HCL ER (SR) 150 MG PO TB12
150.0000 mg | ORAL_TABLET | Freq: Two times a day (BID) | ORAL | Status: DC
  Administered 2023-06-20 – 2023-06-21 (×3): 150 mg via ORAL
  Filled 2023-06-20 (×3): qty 1

## 2023-06-20 MED ORDER — METOCLOPRAMIDE HCL 5 MG/ML IJ SOLN: 5.0000 mg | Freq: Three times a day (TID) | INTRAMUSCULAR | Status: DC | PRN

## 2023-06-20 MED ORDER — SODIUM CHLORIDE 0.9 % IV SOLN
3.0000 g | INTRAVENOUS | Status: AC
  Administered 2023-06-20: 3 g via INTRAVENOUS
  Filled 2023-06-20: qty 3

## 2023-06-20 MED ORDER — DEXTROSE 50 % IV SOLN: 12.5000 g | INTRAVENOUS | Status: AC

## 2023-06-20 MED ORDER — ROCURONIUM BROMIDE 10 MG/ML (PF) SYRINGE
PREFILLED_SYRINGE | INTRAVENOUS | Status: DC | PRN
  Administered 2023-06-20: 20 mg via INTRAVENOUS
  Administered 2023-06-20: 50 mg via INTRAVENOUS

## 2023-06-20 MED ORDER — OXYBUTYNIN CHLORIDE 5 MG PO TABS: 5.0000 mg | ORAL_TABLET | Freq: Three times a day (TID) | ORAL | Status: DC | PRN

## 2023-06-20 MED ORDER — ACETAMINOPHEN 500 MG PO TABS
1000.0000 mg | ORAL_TABLET | Freq: Once | ORAL | Status: AC
  Administered 2023-06-20: 1000 mg via ORAL
  Filled 2023-06-20: qty 2

## 2023-06-20 MED ORDER — METOCLOPRAMIDE HCL 5 MG PO TABS: 5.0000 mg | ORAL_TABLET | Freq: Three times a day (TID) | ORAL | Status: DC | PRN

## 2023-06-20 MED ORDER — INSULIN ASPART 100 UNIT/ML IJ SOLN: 20.0000 [IU] | Freq: Two times a day (BID) | INTRAMUSCULAR | Status: DC

## 2023-06-20 MED ORDER — MIDAZOLAM HCL 2 MG/2ML IJ SOLN
2.0000 mg | Freq: Once | INTRAMUSCULAR | Status: AC
Start: 2023-06-20 — End: 2023-06-20

## 2023-06-20 MED ORDER — HYDROMORPHONE HCL 1 MG/ML IJ SOLN
0.2500 mg | INTRAMUSCULAR | Status: DC | PRN
  Administered 2023-06-20: 0.5 mg via INTRAVENOUS

## 2023-06-20 MED ORDER — BUPIVACAINE-EPINEPHRINE (PF) 0.25% -1:200000 IJ SOLN
INTRAMUSCULAR | Status: DC | PRN
  Administered 2023-06-20: 30 mL via PERINEURAL

## 2023-06-20 MED ORDER — INSULIN ASPART 100 UNIT/ML IJ SOLN: 0.0000 [IU] | Freq: Every day | INTRAMUSCULAR | Status: DC

## 2023-06-20 MED ORDER — HYDROMORPHONE HCL 1 MG/ML IJ SOLN: 0.5000 mg | INTRAMUSCULAR | Status: DC | PRN

## 2023-06-20 MED ORDER — TRANEXAMIC ACID-NACL 1000-0.7 MG/100ML-% IV SOLN
1000.0000 mg | INTRAVENOUS | Status: AC
  Administered 2023-06-20: 1000 mg via INTRAVENOUS
  Filled 2023-06-20: qty 100

## 2023-06-20 MED ORDER — ONDANSETRON HCL 4 MG/2ML IJ SOLN: 4.0000 mg | Freq: Once | INTRAMUSCULAR | Status: DC | PRN

## 2023-06-20 MED ORDER — FENTANYL CITRATE (PF) 100 MCG/2ML IJ SOLN: 25.0000 ug | INTRAMUSCULAR | Status: DC | PRN

## 2023-06-20 MED ORDER — ORAL CARE MOUTH RINSE: 15.0000 mL | Freq: Once | OROMUCOSAL | Status: AC

## 2023-06-20 MED ORDER — DEXTROSE 50 % IV SOLN
INTRAVENOUS | Status: AC
  Administered 2023-06-20: 12.5 g via INTRAVENOUS
  Filled 2023-06-20: qty 50

## 2023-06-20 MED ORDER — DIPHENHYDRAMINE HCL 50 MG/ML IJ SOLN
INTRAMUSCULAR | Status: AC
  Administered 2023-06-20: 25 mg
  Filled 2023-06-20: qty 1

## 2023-06-20 MED ORDER — PROPOFOL 10 MG/ML IV BOLUS
INTRAVENOUS | Status: DC | PRN
  Administered 2023-06-20: 170 mg via INTRAVENOUS

## 2023-06-20 MED ORDER — POVIDONE-IODINE 10 % EX SWAB
2.0000 | Freq: Once | CUTANEOUS | Status: AC
  Administered 2023-06-20: 2 via TOPICAL

## 2023-06-20 MED ORDER — DEXAMETHASONE SODIUM PHOSPHATE 10 MG/ML IJ SOLN
INTRAMUSCULAR | Status: DC | PRN
  Administered 2023-06-20: 10 mg

## 2023-06-20 MED ORDER — LINAGLIPTIN 5 MG PO TABS
5.0000 mg | ORAL_TABLET | Freq: Every day | ORAL | Status: DC
  Administered 2023-06-21: 5 mg via ORAL
  Filled 2023-06-20: qty 1

## 2023-06-20 MED ORDER — HYDROMORPHONE HCL 1 MG/ML IJ SOLN
INTRAMUSCULAR | Status: AC
  Administered 2023-06-20: 0.5 mg via INTRAVENOUS
  Filled 2023-06-20: qty 1

## 2023-06-20 MED ORDER — ROSUVASTATIN CALCIUM 5 MG PO TABS
10.0000 mg | ORAL_TABLET | Freq: Every day | ORAL | Status: DC
  Administered 2023-06-21: 10 mg via ORAL
  Filled 2023-06-20: qty 2

## 2023-06-20 MED ORDER — PANTOPRAZOLE SODIUM 40 MG PO TBEC
40.0000 mg | DELAYED_RELEASE_TABLET | Freq: Every day | ORAL | Status: DC
  Administered 2023-06-20 – 2023-06-21 (×2): 40 mg via ORAL
  Filled 2023-06-20 (×2): qty 1

## 2023-06-20 MED ORDER — PROPOFOL 10 MG/ML IV BOLUS
INTRAVENOUS | Status: AC
  Filled 2023-06-20: qty 20

## 2023-06-20 MED ORDER — AMISULPRIDE (ANTIEMETIC) 5 MG/2ML IV SOLN: 10.0000 mg | Freq: Once | INTRAVENOUS | Status: DC | PRN

## 2023-06-20 MED ORDER — BUPIVACAINE-EPINEPHRINE (PF) 0.25% -1:200000 IJ SOLN
INTRAMUSCULAR | Status: AC
  Filled 2023-06-20: qty 30

## 2023-06-20 MED ORDER — 0.9 % SODIUM CHLORIDE (POUR BTL) OPTIME
TOPICAL | Status: DC | PRN
  Administered 2023-06-20: 1000 mL

## 2023-06-20 MED ORDER — OXYCODONE HCL 5 MG PO TABS
5.0000 mg | ORAL_TABLET | ORAL | Status: DC | PRN
  Filled 2023-06-20 (×2): qty 2

## 2023-06-20 MED ORDER — MIDAZOLAM HCL 2 MG/2ML IJ SOLN
INTRAMUSCULAR | Status: AC
Start: 2023-06-20 — End: ?
  Filled 2023-06-20: qty 2

## 2023-06-20 MED ORDER — DIPHENHYDRAMINE HCL 12.5 MG/5ML PO ELIX: 12.5000 mg | ORAL_SOLUTION | ORAL | Status: DC | PRN

## 2023-06-20 MED ORDER — METOPROLOL TARTRATE 50 MG PO TABS
50.0000 mg | ORAL_TABLET | Freq: Two times a day (BID) | ORAL | Status: DC
  Administered 2023-06-20 – 2023-06-21 (×3): 50 mg via ORAL
  Filled 2023-06-20 (×3): qty 1

## 2023-06-20 MED ORDER — DIVALPROEX SODIUM ER 500 MG PO TB24
500.0000 mg | ORAL_TABLET | Freq: Every day | ORAL | Status: DC
  Administered 2023-06-21: 500 mg via ORAL
  Filled 2023-06-20 (×2): qty 1

## 2023-06-20 MED ORDER — METFORMIN HCL 500 MG PO TABS
1000.0000 mg | ORAL_TABLET | Freq: Two times a day (BID) | ORAL | Status: DC
  Administered 2023-06-21 – 2023-06-22 (×3): 1000 mg via ORAL
  Filled 2023-06-20 (×3): qty 2

## 2023-06-20 MED ORDER — ONDANSETRON HCL 4 MG/2ML IJ SOLN: 4.0000 mg | Freq: Four times a day (QID) | INTRAMUSCULAR | Status: DC | PRN

## 2023-06-20 MED ORDER — SUGAMMADEX SODIUM 200 MG/2ML IV SOLN
INTRAVENOUS | Status: DC | PRN
  Administered 2023-06-20: 200 mg via INTRAVENOUS

## 2023-06-20 MED ORDER — SODIUM CHLORIDE 0.9 % IV SOLN: INTRAVENOUS | Status: DC | PRN

## 2023-06-20 MED ORDER — DOCUSATE SODIUM 100 MG PO CAPS
100.0000 mg | ORAL_CAPSULE | Freq: Two times a day (BID) | ORAL | Status: DC
  Administered 2023-06-20 – 2023-06-21 (×3): 100 mg via ORAL
  Filled 2023-06-20 (×3): qty 1

## 2023-06-20 MED ORDER — DIVALPROEX SODIUM ER 500 MG PO TB24
1000.0000 mg | ORAL_TABLET | Freq: Every day | ORAL | Status: DC
  Administered 2023-06-20 – 2023-06-21 (×2): 1000 mg via ORAL
  Filled 2023-06-20 (×2): qty 2

## 2023-06-20 MED ORDER — CHLORHEXIDINE GLUCONATE 0.12 % MT SOLN
15.0000 mL | Freq: Once | OROMUCOSAL | Status: AC
  Administered 2023-06-20: 15 mL via OROMUCOSAL
  Filled 2023-06-20: qty 15

## 2023-06-20 MED ORDER — ACETAMINOPHEN 325 MG PO TABS
325.0000 mg | ORAL_TABLET | Freq: Four times a day (QID) | ORAL | Status: DC | PRN
  Administered 2023-06-22: 650 mg via ORAL
  Filled 2023-06-20: qty 2

## 2023-06-20 MED ORDER — MIDAZOLAM HCL 2 MG/2ML IJ SOLN
INTRAMUSCULAR | Status: DC | PRN
  Administered 2023-06-20: 2 mg via INTRAVENOUS

## 2023-06-20 MED ORDER — ROPIVACAINE HCL 5 MG/ML IJ SOLN
INTRAMUSCULAR | Status: DC | PRN
  Administered 2023-06-20: 20 mL via PERINEURAL

## 2023-06-20 MED ORDER — MENTHOL 3 MG MT LOZG: 1.0000 | LOZENGE | OROMUCOSAL | Status: DC | PRN

## 2023-06-20 MED ORDER — FENTANYL CITRATE (PF) 100 MCG/2ML IJ SOLN: 50.0000 ug | Freq: Once | INTRAMUSCULAR | Status: AC

## 2023-06-20 MED ORDER — SODIUM CHLORIDE 0.9 % IV SOLN: INTRAVENOUS | Status: AC

## 2023-06-20 MED ORDER — INSULIN GLARGINE-YFGN 100 UNIT/ML ~~LOC~~ SOLN
20.0000 [IU] | Freq: Two times a day (BID) | SUBCUTANEOUS | Status: DC
  Administered 2023-06-20 – 2023-06-21 (×3): 20 [IU] via SUBCUTANEOUS
  Filled 2023-06-20 (×5): qty 0.2

## 2023-06-20 MED ORDER — ALUM & MAG HYDROXIDE-SIMETH 200-200-20 MG/5ML PO SUSP: 30.0000 mL | ORAL | Status: DC | PRN

## 2023-06-20 MED ORDER — METHOCARBAMOL 1000 MG/10ML IJ SOLN: 500.0000 mg | Freq: Four times a day (QID) | INTRAMUSCULAR | Status: DC | PRN

## 2023-06-20 MED ORDER — DEXAMETHASONE SODIUM PHOSPHATE 10 MG/ML IJ SOLN
INTRAMUSCULAR | Status: DC | PRN
  Administered 2023-06-20: 4 mg via INTRAVENOUS

## 2023-06-20 MED ORDER — OXYCODONE HCL 5 MG PO TABS
10.0000 mg | ORAL_TABLET | ORAL | Status: DC | PRN
  Administered 2023-06-20 – 2023-06-22 (×7): 10 mg via ORAL
  Filled 2023-06-20 (×5): qty 2

## 2023-06-20 MED ORDER — PHENOL 1.4 % MT LIQD: 1.0000 | OROMUCOSAL | Status: DC | PRN

## 2023-06-20 MED ORDER — MIDAZOLAM HCL 2 MG/2ML IJ SOLN
INTRAMUSCULAR | Status: AC
  Administered 2023-06-20: 2 mg via INTRAVENOUS
  Filled 2023-06-20: qty 2

## 2023-06-20 MED ORDER — ONDANSETRON HCL 4 MG/2ML IJ SOLN
INTRAMUSCULAR | Status: DC | PRN
  Administered 2023-06-20: 4 mg via INTRAVENOUS

## 2023-06-20 MED ORDER — INSULIN ASPART 100 UNIT/ML IJ SOLN
0.0000 [IU] | Freq: Three times a day (TID) | INTRAMUSCULAR | Status: DC
  Administered 2023-06-21 – 2023-06-22 (×3): 4 [IU] via SUBCUTANEOUS

## 2023-06-20 MED ORDER — FERROUS SULFATE 325 (65 FE) MG PO TABS
325.0000 mg | ORAL_TABLET | Freq: Every day | ORAL | Status: DC
  Administered 2023-06-21 – 2023-06-22 (×2): 325 mg via ORAL
  Filled 2023-06-20 (×2): qty 1

## 2023-06-20 MED ORDER — FENTANYL CITRATE (PF) 100 MCG/2ML IJ SOLN
INTRAMUSCULAR | Status: AC
  Administered 2023-06-20: 50 ug via INTRAVENOUS
  Filled 2023-06-20: qty 2

## 2023-06-20 MED ORDER — RIVAROXABAN 20 MG PO TABS
20.0000 mg | ORAL_TABLET | Freq: Every day | ORAL | Status: DC
  Administered 2023-06-21: 20 mg via ORAL
  Filled 2023-06-20: qty 1

## 2023-06-20 MED ORDER — GABAPENTIN 100 MG PO CAPS
100.0000 mg | ORAL_CAPSULE | Freq: Three times a day (TID) | ORAL | Status: DC
  Administered 2023-06-20 – 2023-06-21 (×4): 100 mg via ORAL
  Filled 2023-06-20 (×4): qty 1

## 2023-06-20 MED ORDER — LIDOCAINE 2% (20 MG/ML) 5 ML SYRINGE
INTRAMUSCULAR | Status: DC | PRN
  Administered 2023-06-20: 80 mg via INTRAVENOUS

## 2023-06-20 SURGICAL SUPPLY — 64 items
ARTISURF 10M VEL 10-12 GH KNEE (Knees) IMPLANT
BAG COUNTER SPONGE SURGICOUNT (BAG) ×1 IMPLANT
BANDAGE ESMARK 6X9 LF (GAUZE/BANDAGES/DRESSINGS) ×1 IMPLANT
BLADE SAG 18X100X1.27 (BLADE) ×1 IMPLANT
BLADE SAW SGTL 11.0X1.19X90.0M (BLADE) IMPLANT
BNDG ELASTIC 6INX 5YD STR LF (GAUZE/BANDAGES/DRESSINGS) IMPLANT
BNDG ELASTIC 6X5.8 VLCR STR LF (GAUZE/BANDAGES/DRESSINGS) ×2 IMPLANT
BNDG ESMARK 6X9 LF (GAUZE/BANDAGES/DRESSINGS) ×1 IMPLANT
BOWL SMART MIX CTS (DISPOSABLE) IMPLANT
CEMENT BONE R 1X40 (Cement) IMPLANT
COMP TIB KNEE PS G 0D LT (Joint) ×1 IMPLANT
COMPONENT TIB KNEE PS G 0D LT (Joint) IMPLANT
COOLER ICEMAN CLASSIC (MISCELLANEOUS) ×1 IMPLANT
COVER SURGICAL LIGHT HANDLE (MISCELLANEOUS) ×1 IMPLANT
CUFF TOURN SGL QUICK 42 (TOURNIQUET CUFF) IMPLANT
CUFF TRNQT CYL 34X4.125X (TOURNIQUET CUFF) ×1 IMPLANT
DRAPE EXTREMITY T 121X128X90 (DISPOSABLE) ×1 IMPLANT
DRAPE HALF SHEET 40X57 (DRAPES) ×1 IMPLANT
DRAPE INCISE IOBAN 66X45 STRL (DRAPES) IMPLANT
DRAPE U-SHAPE 47X51 STRL (DRAPES) ×1 IMPLANT
DURAPREP 26ML APPLICATOR (WOUND CARE) ×1 IMPLANT
ELECT BLADE 4.0 EZ CLEAN MEGAD (MISCELLANEOUS) ×1 IMPLANT
ELECT CAUTERY BLADE 6.4 (BLADE) ×1 IMPLANT
ELECT REM PT RETURN 9FT ADLT (ELECTROSURGICAL) ×1 IMPLANT
ELECTRODE BLDE 4.0 EZ CLN MEGD (MISCELLANEOUS) IMPLANT
ELECTRODE REM PT RTRN 9FT ADLT (ELECTROSURGICAL) ×1 IMPLANT
FACESHIELD WRAPAROUND (MASK) ×2 IMPLANT
FACESHIELD WRAPAROUND OR TEAM (MASK) ×2 IMPLANT
FEMUR CMT CCR STD SZ10 L KNEE (Knees) ×1 IMPLANT
FEMUR CMTD CR PERS STD SZ 5 RT (Knees) IMPLANT
GAUZE PAD ABD 8X10 STRL (GAUZE/BANDAGES/DRESSINGS) ×1 IMPLANT
GAUZE SPONGE 4X4 12PLY STRL (GAUZE/BANDAGES/DRESSINGS) ×1 IMPLANT
GAUZE XEROFORM 1X8 LF (GAUZE/BANDAGES/DRESSINGS) ×1 IMPLANT
GLOVE BIOGEL PI IND STRL 8 (GLOVE) ×2 IMPLANT
GLOVE ORTHO TXT STRL SZ7.5 (GLOVE) ×1 IMPLANT
GLOVE SURG ORTHO 8.0 STRL STRW (GLOVE) ×1 IMPLANT
GOWN STRL REUS W/ TWL LRG LVL3 (GOWN DISPOSABLE) IMPLANT
GOWN STRL REUS W/ TWL XL LVL3 (GOWN DISPOSABLE) ×2 IMPLANT
IMMOBILIZER KNEE 22 UNIV (SOFTGOODS) ×1 IMPLANT
IV NS 1000ML BAXH (IV SOLUTION) ×1 IMPLANT
KIT BASIN OR (CUSTOM PROCEDURE TRAY) ×1 IMPLANT
KIT TURNOVER KIT B (KITS) ×1 IMPLANT
MANIFOLD NEPTUNE II (INSTRUMENTS) ×1 IMPLANT
NDL 18GX1X1/2 (RX/OR ONLY) (NEEDLE) IMPLANT
NEEDLE 18GX1X1/2 (RX/OR ONLY) (NEEDLE) IMPLANT
NS IRRIG 1000ML POUR BTL (IV SOLUTION) ×1 IMPLANT
PACK TOTAL JOINT (CUSTOM PROCEDURE TRAY) ×1 IMPLANT
PAD ARMBOARD 7.5X6 YLW CONV (MISCELLANEOUS) ×1 IMPLANT
PAD COLD SHLDR WRAP-ON (PAD) ×1 IMPLANT
PADDING CAST COTTON 6X4 STRL (CAST SUPPLIES) ×1 IMPLANT
PIN DRILL HDLS TROCAR 75 4PK (PIN) IMPLANT
SCREW FEMALE HEX FIX 25X2.5 (ORTHOPEDIC DISPOSABLE SUPPLIES) IMPLANT
SET HNDPC FAN SPRY TIP SCT (DISPOSABLE) ×1 IMPLANT
SET PAD KNEE POSITIONER (MISCELLANEOUS) ×1 IMPLANT
STAPLER VISISTAT 35W (STAPLE) ×1 IMPLANT
STEM POLY PAT PLY 32M KNEE (Knees) IMPLANT
SUCTION TUBE FRAZIER 10FR DISP (SUCTIONS) ×1 IMPLANT
SUT VIC AB 0 CT1 27XBRD ANBCTR (SUTURE) ×1 IMPLANT
SUT VIC AB 1 CT1 27XBRD ANBCTR (SUTURE) ×2 IMPLANT
SUT VIC AB 2-0 CT1 TAPERPNT 27 (SUTURE) ×2 IMPLANT
SYR 50ML LL SCALE MARK (SYRINGE) IMPLANT
TOWEL GREEN STERILE (TOWEL DISPOSABLE) ×1 IMPLANT
TOWEL GREEN STERILE FF (TOWEL DISPOSABLE) ×1 IMPLANT
TRAY CATH INTERMITTENT SS 16FR (CATHETERS) IMPLANT

## 2023-06-20 NOTE — Anesthesia Procedure Notes (Signed)
Procedure Name: Intubation Date/Time: 06/20/2023 2:48 PM  Performed by: Sandie Ano, CRNAPre-anesthesia Checklist: Patient identified, Emergency Drugs available, Suction available and Patient being monitored Patient Re-evaluated:Patient Re-evaluated prior to induction Oxygen Delivery Method: Circle System Utilized Preoxygenation: Pre-oxygenation with 100% oxygen Induction Type: IV induction Ventilation: Mask ventilation without difficulty Laryngoscope Size: Mac and 3 Grade View: Grade I Tube type: Oral Tube size: 7.0 mm Number of attempts: 1 Airway Equipment and Method: Stylet and Oral airway Placement Confirmation: ETT inserted through vocal cords under direct vision, positive ETCO2 and breath sounds checked- equal and bilateral Secured at: 23 cm Tube secured with: Tape Dental Injury: Teeth and Oropharynx as per pre-operative assessment

## 2023-06-20 NOTE — Op Note (Signed)
Operative Note  Date of operation: 06/20/2023 Preoperative diagnosis: Left knee primary osteoarthritis Postoperative diagnosis: Same  Procedure: Left cemented total knee arthroplasty  Implants: Biomet/Zimmer persona cemented knee system Implant Name Type Inv. Item Serial No. Manufacturer Lot No. LRB No. Used Action  CEMENT BONE R 1X40 - [id number] Cement CEMENT BONE R 1X40  ZIMMER RECON(ORTH,TRAU,BIO,SG) [id number] Left 1 Implanted  CEMENT BONE R 1X40 - [id number] Cement CEMENT BONE R 1X40  ZIMMER [employer]ON(ORTH,TRAU,BIO,SG) [id number] Left 1 Implanted  COMP TIB KNEE PS G 0D LT - [id number] Joint COMP TIB KNEE PS G 0D LT  ZIMMER RECON(ORTH,TRAU,BIO,SG) [id number] Left 1 Implanted  ARTISURF 71M VEL 10-12 GH KNEE - [id number] Knees ARTISURF 71M VEL 10-12 GH KNEE  ZIMMER [employer]ON(ORTH,TRAU,BIO,SG) [id number] Left 1 Implanted  STEM POLY PAT PLY 37M KNEE - [id number] Knees STEM POLY PAT PLY 37M KNEE  ZIMMER RECON(ORTH,TRAU,BIO,SG) [id number] Left 1 Implanted  FEMUR CMT CCR STD SZ10 L KNEE - [id number] Knees FEMUR CMT CCR STD SZ10 L KNEE  ZIMMER [employer]ON(ORTH,TRAU,BIO,SG) [id number] Left 1 Implanted   Surgeon: Vanita Panda. Magnus Ivan, MD Assistant: Rexene Edison, PA-C  Anesthesia: #1 left lower extremity adductor canal block, #2 General, #3 local Tourniquet time: Under 1-1/2 hours EBL: Less than 100 cc Antibiotics: IV Ancef Complications: None  Indications: The patient is a 67 year old female with debilitating arthritis involving her left knee.  Her x-rays show bone-on-bone wear with significant valgus malalignment of that knee.  Her pain is severe with her left knee and it is daily.  She has tried and failed all forms of conservative treatment.  This point her left knee pain is detrimentally affecting her mobility, her quality of life and her activities daily living.  We recommended knee replacement and she does wish to proceed.  We did talk about the risk of acute blood loss anemia, nerve vessel injury,  fracture, infection, DVT, implant failure and wound healing issues.  She understands that our goals are hopefully decreased pain, improved mobility, and improved quality of life.  Procedure description: After informed consent was obtained and the appropriate left knee was marked, anesthesia obtained a left lower extremity block in the holding room.  Patient was then brought to the operating room and placed supine on the operating table general anesthesia was obtained.  A nonsterile tourniquet was placed on her upper left thigh and her left thigh, knee, leg and ankle were prepped and draped in DuraPrep and sterile drapes including a sterile stockinette.  A timeout was called and she was identified as correct patient the correct left knee.  An Esmarch was then used to wrap out the leg and the tourniquet inflated 300 mm of pressure.  We then made a midline incision over the patella and carried this proximally and distally.  Dissection was carried down to the knee joint and a medial parapatellar arthrotomy was made.  There was a large joint effusion encountered.  With the knee in a flexed position we removed remnants of the ACL as well as medial lateral meniscus and osteophytes in all 3 compartments of the knee.  We also removed the PCL.  We then used an extramedullary cutting guide for making her proximal tibia cut correction for varus and valgus and a 7 degree slope.  We made this cut to take 2 mm off the low side and we backed up alcohol elevated at making a cut.  We do need to intramedullary referencing guide for distal femur cut setting this for a left knee at 4 degrees externally  rotated and setting this for 10 mm distal femoral cut we actually make this down for more millimeters as well.  She had a significant flexion contracture before we started.  We brought the knee back down to full extension with a 10 mm extension block we have achieved full extension.  We go back to the femoral sizing guide and placed  this on the femur based off the epicondylar axis.  Based off of this we chose a size 10 femur.  We put a 4-in-1 cutting block for a size 10 femur and made our anterior and posterior cuts followed by the chamfer cuts.  We then made the femoral box cut.  We irregular tibia and chose a size G left tibial tray for coverage over the tibial plateau setting the rotation of the tibial tubercle and the femur.  We did our drill punch and kill all of this.  We then trialed our size G tibia combined with our size 10 PS standard femur.  We placed a 10 mm PS polyethylene insert we are pleased with range of motion and stability without insert.  We then made a patella cut and drilled 3 holes for a size 32 patella button.  Again with all trial instrumentation they were pleased with range of motion as well as overall alignment and stability.  We then removed all trial instrumentation the knee and irrigated the knee with normal saline solution.  We then placed Marcaine with epinephrine around the arthrotomy.  Next on the back table the cement was mixed.  With anticoagulation and dry we cemented our Biomet/Zimmer persona tibial tray for a left knee size G followed by cementing our size 10 left PS standard femur.  We next placed our 10 mm PS polyethylene insert and cemented our size 32 patella button.  We then held the knee fully extended and compressed while the cement hardened.  Once it hardened the tourniquet was let down and hemostasis was obtained with electrocautery.  The arthrotomy was then closed with interrupted #1 Vicryl suture followed by 0 Vicryl to close the deep tissue and 2-0 Vicryl for subcutaneous tissue.  The skin was closed staples.  Well-padded sterile dressings applied.  The patient was awakened, extubated and taken the recovery room.  Rexene Edison, PA-C did assist during the entire case and beginning him and his assistance was crucial and medically necessary for soft tissue management and retraction, helping guide  implant placement and a layered closure of the wound.

## 2023-06-20 NOTE — Transfer of Care (Signed)
Immediate Anesthesia Transfer of Care Note  Patient: Rhonda Hickman  Procedure(s) Performed: LEFT TOTAL KNEE ARTHROPLASTY (Left: Knee)  Patient Location: PACU  Anesthesia Type:General  Level of Consciousness: awake, alert , and oriented  Airway & Oxygen Therapy: Patient Spontanous Breathing  Post-op Assessment: Report given to RN and Post -op Vital signs reviewed and stable  Post vital signs: Reviewed and stable  Last Vitals:  Vitals Value Taken Time  BP 158/87 06/20/23 1700  Temp    Pulse 72 06/20/23 1702  Resp 18 06/20/23 1702  SpO2 93 % 06/20/23 1702  Vitals shown include unfiled device data.  Last Pain:  Vitals:   06/20/23 1001  TempSrc:   PainSc: 0-No pain         Complications: No notable events documented.

## 2023-06-20 NOTE — Interval H&P Note (Signed)
History and Physical Interval Note: Patient understands that we are proceeding today with a left total knee replacement to treat her severe left knee arthritis.  There has been no acute or interval change in her medical status.  The risks and benefits of surgery have been discussed in detail and informed consent has been obtained.  The left operative knee has been marked.  06/20/2023 11:37 AM  Rhonda Hickman  has presented today for surgery, with the diagnosis of osteoarthritis left knee.  The various methods of treatment have been discussed with the patient and family. After consideration of risks, benefits and other options for treatment, the patient has consented to  Procedure(s): LEFT TOTAL KNEE ARTHROPLASTY (Left) as a surgical intervention.  The patient's history has been reviewed, patient examined, no change in status, stable for surgery.  I have reviewed the patient's chart and labs.  Questions were answered to the patient's satisfaction.     Rhonda Hitch

## 2023-06-20 NOTE — Telephone Encounter (Signed)
Requested medication (s) are due for refill today -provider review   Requested medication (s) are on the active medication list -yes  Future visit scheduled -no  Last refill: 06/09/23  Notes to clinic: medication listed as historical-sent for review   Requested Prescriptions  Pending Prescriptions Disp Refills   SYMBICORT 160-4.5 MCG/ACT inhaler [Pharmacy Med Name: Symbicort 160 mcg-4.5 mcg/actuation HFA aerosol inhaler] 10.2 g 11    Sig: INHALE TWO PUFFS BY MOUTH INTO LUNGS TWICE DAILY     Pulmonology:  Combination Products Passed - 06/19/2023  5:42 AM      Passed - Valid encounter within last 12 months    Recent Outpatient Visits           3 weeks ago Type 2 diabetes mellitus with hyperosmolarity without coma, with long-term current use of insulin (HCC)   [place] [facility] Grayce Sessions, NP   4 months ago Type 2 diabetes mellitus with hyperosmolarity without coma, with long-term current use of insulin (HCC)   [place] [facility] Grayce Sessions, NP   7 months ago Type 2 diabetes mellitus with hyperosmolarity without coma, with long-term current use of insulin (HCC)   [place] [facility] Grayce Sessions, NP   10 months ago Acute pain of left shoulder   [place] [facility] Grayce Sessions, NP   12 months ago Type 2 diabetes mellitus with hyperosmolarity without coma, with long-term current use of insulin (HCC)   [place] [facility] Grayce Sessions, NP                 Requested Prescriptions  Pending Prescriptions Disp Refills   SYMBICORT 160-4.5 MCG/ACT inhaler [Pharmacy Med Name: Symbicort 160 mcg-4.5 mcg/actuation HFA aerosol inhaler] 10.2 g 11    Sig: INHALE TWO PUFFS BY MOUTH INTO LUNGS TWICE DAILY     Pulmonology:  Combination Products Passed - 06/19/2023  5:42 AM      Passed - Valid encounter within last 12 months    Recent Outpatient Visits            3 weeks ago Type 2 diabetes mellitus with hyperosmolarity without coma, with long-term current use of insulin (HCC)   [place] [facility] Grayce Sessions, NP   4 months ago Type 2 diabetes mellitus with hyperosmolarity without coma, with long-term current use of insulin (HCC)   [place] [facility] Grayce Sessions, NP   7 months ago Type 2 diabetes mellitus with hyperosmolarity without coma, with long-term current use of insulin (HCC)   [place] [facility] Grayce Sessions, NP   10 months ago Acute pain of left shoulder   [place] [facility] Grayce Sessions, NP   12 months ago Type 2 diabetes mellitus with hyperosmolarity without coma, with long-term current use of insulin [facility])   [place] [facility] Grayce Sessions, NP

## 2023-06-20 NOTE — Discharge Instructions (Signed)

## 2023-06-20 NOTE — Telephone Encounter (Signed)
Referral sent to [facility] for HHPT

## 2023-06-20 NOTE — Anesthesia Procedure Notes (Signed)
Anesthesia Regional Block: Adductor canal block   Pre-Anesthetic Checklist: , timeout performed,  Correct Patient, Correct Site, Correct Laterality,  Correct Procedure, Correct Position, site marked,  Risks and benefits discussed,  Surgical consent,  Pre-op evaluation,  At surgeon's request and post-op pain management  Laterality: Left  Prep: chloraprep       Needles:  Injection technique: Single-shot  Needle Type: Echogenic Needle     Needle Length: 9cm  Needle Gauge: 21     Additional Needles:   Procedures:,,,, ultrasound used (permanent image in chart),,    Narrative:  Start time: 06/20/2023 1:18 PM End time: 06/20/2023 1:25 PM Injection made incrementally with aspirations every 5 mL.  Performed by: Personally  Anesthesiologist: Collene Schlichter, MD  Additional Notes: No pain on injection. No increased resistance to injection. Injection made in 5cc increments.  Good needle visualization.  Patient tolerated procedure well.

## 2023-06-21 ENCOUNTER — Other Ambulatory Visit: Payer: Self-pay

## 2023-06-21 ENCOUNTER — Encounter (HOSPITAL_COMMUNITY): Payer: Self-pay | Admitting: Orthopaedic Surgery

## 2023-06-21 DIAGNOSIS — M1712 Unilateral primary osteoarthritis, left knee: Secondary | ICD-10-CM | POA: Diagnosis not present

## 2023-06-21 LAB — CBC
HCT: 34.3 % — ABNORMAL LOW (ref 36.0–46.0)
Hemoglobin: 11.2 g/dL — ABNORMAL LOW (ref 12.0–15.0)
MCH: 28.1 pg (ref 26.0–34.0)
MCHC: 32.7 g/dL (ref 30.0–36.0)
MCV: 86 fL (ref 80.0–100.0)
Platelets: 250 10*3/uL (ref 150–400)
RBC: 3.99 MIL/uL (ref 3.87–5.11)
RDW: 14.1 % (ref 11.5–15.5)
WBC: 11.7 10*3/uL — ABNORMAL HIGH (ref 4.0–10.5)
nRBC: 0 % (ref 0.0–0.2)

## 2023-06-21 LAB — GLUCOSE, CAPILLARY
Glucose-Capillary: 180 mg/dL — ABNORMAL HIGH (ref 70–99)
Glucose-Capillary: 184 mg/dL — ABNORMAL HIGH (ref 70–99)
Glucose-Capillary: 184 mg/dL — ABNORMAL HIGH (ref 70–99)
Glucose-Capillary: 185 mg/dL — ABNORMAL HIGH (ref 70–99)
Glucose-Capillary: 191 mg/dL — ABNORMAL HIGH (ref 70–99)

## 2023-06-21 LAB — BASIC METABOLIC PANEL
Anion gap: 9 (ref 5–15)
BUN: 11 mg/dL (ref 8–23)
CO2: 25 mmol/L (ref 22–32)
Calcium: 9.2 mg/dL (ref 8.9–10.3)
Chloride: 102 mmol/L (ref 98–111)
Creatinine, Ser: 1.09 mg/dL — ABNORMAL HIGH (ref 0.44–1.00)
GFR, Estimated: 56 mL/min — ABNORMAL LOW (ref >60)
Glucose, Bld: 207 mg/dL — ABNORMAL HIGH (ref 70–99)
Potassium: 4.4 mmol/L (ref 3.5–5.1)
Sodium: 136 mmol/L (ref 135–145)

## 2023-06-21 NOTE — Progress Notes (Signed)
Subjective: 1 Day Post-Op Procedure(s) (LRB): LEFT TOTAL KNEE ARTHROPLASTY (Left) Patient reports pain as moderate.    Objective: Vital signs in last 24 hours: Temp:  [97.2 F (36.2 C)-99 F (37.2 C)] 98.2 F (36.8 C) (12/11 0743) Pulse Rate:  [65-86] 80 (12/11 0743) Resp:  [17-20] 20 (12/11 0743) BP: (137-167)/(73-99) 150/73 (12/11 0743) SpO2:  [93 %-100 %] 100 % (12/11 0743) Weight:  [120.2 kg] 120.2 kg (12/10 0938)  Intake/Output from previous day: 12/10 0701 - 12/11 0700 In: 1000 [I.V.:800; IV Piggyback:200] Out: 1550 [Urine:1500; Blood:50] Intake/Output this shift: No intake/output data recorded.  No results for input(s): "HGB" in the last 72 hours. No results for input(s): "WBC", "RBC", "HCT", "PLT" in the last 72 hours. No results for input(s): "NA", "K", "CL", "CO2", "BUN", "CREATININE", "GLUCOSE", "CALCIUM" in the last 72 hours. No results for input(s): "LABPT", "INR" in the last 72 hours.  Sensation intact distally Intact pulses distally Dorsiflexion/Plantar flexion intact Incision: dressing C/D/I Compartment soft   Assessment/Plan: 1 Day Post-Op Procedure(s) (LRB): LEFT TOTAL KNEE ARTHROPLASTY (Left) Up with therapy      Kathryne Hitch 06/21/2023, 7:45 AM

## 2023-06-21 NOTE — Progress Notes (Signed)
Pt is pre-arranged with [facility] for home therapy. Information on the AVS.

## 2023-06-21 NOTE — Evaluation (Signed)
Physical Therapy Evaluation Patient Details Name: Rhonda Hickman MRN: [id number] DOB: Jan 06, 1956 Today's Date: 06/21/2023  History of Present Illness  Pt is a 67 y.o. female s/p L cemented TKA. PMH signficiant for lumbar radiculopathy, a-fib, HLD, HTN, bipolar 1 disorder, TIA, vertigo, asthma, GERD, COPD, DM, osteoarthritis of R knee.  Clinical Impression   Pt admitted with above diagnosis. PTA, pt lived alone and had PCA 3 hours/day for 7 days/week who assisted with IADL, bathing, and occasionally dressing. Pt predominantly using Jazzy chair for mobility for some time now secondary to knee pain. Presents to PT with decr LLE strength and ROM, decr activity tolerance;  Upon eval, pt requires min A for transfers, min assist for RW management, and sequence and ambulation; close guard for safety, as pt tended to slow her eyes during session;   Pt currently with functional limitations due to the deficits listed below (see PT Problem List). Pt will benefit from skilled PT to increase their independence and safety with mobility to allow discharge to the venue listed below.       BP 130/79 (94) HR 75 seated after inital bout of gait; BP 112/98 (104) HR 208? with STS up from chair. Positive for dizziness and eye closing; suspect due to both effort and medications.  Pt to benefit from one more night stay acutely to optimize carryover of education and mobility prior to return home.       If plan is discharge home, recommend the following: A little help with walking and/or transfers;A little help with bathing/dressing/bathroom;Assistance with cooking/housework   Can travel by Administrator walker (2 wheels);BSC/3in1;Other (comment) (May already have a RW)  Recommendations for Other Services  OT consult (as ordered)    Functional Status Assessment Patient has had a recent decline in their functional status and demonstrates the ability to make significant  improvements in function in a reasonable and predictable amount of time.     Precautions / Restrictions Precautions Precautions: Knee Precaution Booklet Issued: Yes (comment) Precaution Comments: provided by PT entering room. All precautions reviewed within context of ADL and mobility Required Braces or Orthoses: Knee Immobilizer - Left Restrictions Weight Bearing Restrictions: Yes LLE Weight Bearing: Weight bearing as tolerated   Pt educated to not allow any pillow or bolster under knee for healing with optimal range of motion.      Mobility  Bed Mobility Overal bed mobility: Needs Assistance Bed Mobility: Supine to Sit, Sit to Supine     Supine to sit: Contact guard Sit to supine: Min assist   General bed mobility comments: to bring LLE into bed    Transfers Overall transfer level: Needs assistance Equipment used: Rolling walker (2 wheels) Transfers: Sit to/from Stand Sit to Stand: Min assist           General transfer comment: cues for hand and foot placement/optimal technique. Approaching CGA; heavy use of momentum up from recliner chair    Ambulation/Gait Ambulation/Gait assistance: Min assist, +2 safety/equipment (chair follow) Gait Distance (Feet): 16 Feet (+8) Assistive device: Rolling walker (2 wheels) Gait Pattern/deviations: Step-through pattern (emerging) Gait velocity: quite slow     General Gait Details: Cues for gait sequence and step length; Opted to use the KI for morning walk for knee stabiltiy in stance and to boost confidence for more distance; Pt notably fatigued with incr distance, eyes closing, and some dizziness reported; one seated rest break  Stairs  Wheelchair Mobility     Tilt Bed    Modified Rankin (Stroke Patients Only)       Balance Overall balance assessment: Needs assistance Sitting-balance support: No upper extremity supported, Feet supported Sitting balance-Leahy Scale: Good     Standing balance  support: Bilateral upper extremity supported, During functional activity Standing balance-Leahy Scale: Poor Standing balance comment: reliant on RW and intermittent external support                             Pertinent Vitals/Pain Pain Assessment Pain Assessment: No/denies pain (Reports pain pill is "working")    Home Living Family/patient expects to be discharged to:: Private residence Living Arrangements: Alone Available Help at Discharge: Personal care attendant (7 days/week, 3 hours/day and often stays at night per pt (can stay more often as well)) Type of Home: Apartment Home Access: Level entry       Home Layout: One level Home Equipment: Agricultural consultant (2 wheels);Toilet riser;Tub bench;Grab bars - tub/shower;Adaptive equipment;Hand held shower head Customer service manager chair)      Prior Function Prior Level of Function : Needs assist             Mobility Comments: Recent use of jazzy chair in home with very limited ambulation ADLs Comments: PCA asissts with bathing every other day, shower tranfers, occasional LB ADL. PCA also asissts with meals, cleaning.     Extremity/Trunk Assessment   Upper Extremity Assessment Upper Extremity Assessment: Defer to OT evaluation    Lower Extremity Assessment Lower Extremity Assessment: LLE deficits/detail LLE Deficits / Details: Grossly decr AROM and strength; tends to use hand to more her knee and leg; Will need more assessment of ROM next session with therex       Communication   Communication Communication: No apparent difficulties;Other (comment) (occasional increased cues secondary to elthargy) Cueing Techniques: Verbal cues;Tactile cues  Cognition Arousal: Alert, Lethargic, Suspect due to medications Behavior During Therapy: WFL for tasks assessed/performed, Flat affect Overall Cognitive Status: Within Functional Limits for tasks assessed                                 General Comments: suspect some  decreased recall of new education secondary to lethargy from medications; pt however, following commands [facility]        General Comments General comments (skin integrity, edema, etc.): BP 130/79 (94) HR 75 seated after inital bout of gait; BP 112/98 (104) HR 208? with STS up from chair. Positive for dizziness and eye closing; suspect due to both effort and medications.    Exercises Other Exercises Other Exercises: reviewed precautions, brace wear, and positioning of knee at rest   Assessment/Plan    PT Assessment Patient needs continued PT services  PT Problem List Decreased strength;Decreased range of motion;Decreased activity tolerance;Decreased balance;Decreased mobility;Decreased coordination;Decreased cognition;Decreased knowledge of use of DME;Decreased safety awareness;Decreased knowledge of precautions;Pain       PT Treatment Interventions DME instruction;Gait training;Functional mobility training;Therapeutic activities;Therapeutic exercise;Balance training;Neuromuscular re-education;Cognitive remediation;Patient/family education    PT Goals (Current goals can be found in the Care Plan section)  Acute Rehab PT Goals Patient Stated Goal: Be able to DC home PT Goal Formulation: With patient Time For Goal Achievement: 07/05/23 Potential to Achieve Goals: Good    Frequency 7X/week     Co-evaluation               AM-PAC  PT "6 Clicks" Mobility  Outcome Measure Help needed turning from your back to your side while in a flat bed without using bedrails?: None Help needed moving from lying on your back to sitting on the side of a flat bed without using bedrails?: A Little Help needed moving to and from a bed to a chair (including a wheelchair)?: A Little Help needed standing up from a chair using your arms (e.g., wheelchair or bedside chair)?: A Little Help needed to walk in hospital room?: A Lot Help needed climbing 3-5 steps with a railing? : A Lot 6 Click Score: 17     End of Session Equipment Utilized During Treatment: Gait belt;Left knee immobilizer Activity Tolerance: Patient tolerated treatment well;Other (comment) (though with some dizziness with incr time in upright) Patient left: in chair;with call bell/phone within reach Nurse Communication: Mobility status PT Visit Diagnosis: Unsteadiness on feet (R26.81);Other abnormalities of gait and mobility (R26.89);Pain;Muscle weakness (generalized) (M62.81) Pain - Right/Left: Left Pain - part of body: Knee    Time: 1610-9604 PT Time Calculation (min) (ACUTE ONLY): 36 min   Charges:   PT Evaluation $PT Eval Moderate Complexity: 1 Mod   PT General Charges $$ ACUTE PT VISIT: 1 Visit         Van Clines, PT  Acute Rehabilitation Services Office [phone] Secure Chat welcomed   Levi Aland 06/21/2023, 11:10 AM

## 2023-06-21 NOTE — Anesthesia Postprocedure Evaluation (Signed)
Anesthesia Post Note  Patient: Rhonda Hickman  Procedure(s) Performed: LEFT TOTAL KNEE ARTHROPLASTY (Left: Knee)     Patient location during evaluation: PACU Anesthesia Type: General Level of consciousness: awake and alert Pain management: pain level controlled Vital Signs Assessment: post-procedure vital signs reviewed and stable Respiratory status: spontaneous breathing, nonlabored ventilation, respiratory function stable and patient connected to nasal cannula oxygen Cardiovascular status: blood pressure returned to baseline and stable Postop Assessment: no apparent nausea or vomiting Anesthetic complications: no  No notable events documented.  Last Vitals:  Vitals:   06/21/23 2022 06/21/23 2311  BP: 139/81 (!) 150/79  Pulse: 92 82  Resp: 18 18  Temp: 37.7 C 37.5 C  SpO2: 96% 96%    Last Pain:  Vitals:   06/21/23 2311  TempSrc: Oral  PainSc:    Pain Goal: Patients Stated Pain Goal: 2 (06/21/23 2101)                 Ching Rabideau L Melita Villalona

## 2023-06-21 NOTE — Progress Notes (Signed)
Physical Therapy Treatment Patient Details Name: Rhonda Hickman MRN: [id number] DOB: August 18, 1955 Today's Date: 06/21/2023   History of Present Illness Pt is a 67 y.o. female s/p L cemented TKA. PMH signficiant for lumbar radiculopathy, a-fib, HLD, HTN, bipolar 1 disorder, TIA, vertigo, asthma, GERD, COPD, DM, osteoarthritis of R knee.    PT Comments  Continuing work on functional mobility and activity tolerance;  Session focused on TKA therex and more bouts with upright standing and taking steps; Showing good effort with sit to stand and therex; noting improved quad contraction with more time spent on short arc quads; Min assist with sit to stand mostly for RW stability; Amb distance limited by nausea this afternoon; Reports she will have help from her aide and from her sister at home   If plan is discharge home, recommend the following: A little help with walking and/or transfers;A little help with bathing/dressing/bathroom;Assistance with cooking/housework   Can travel by Media planner walker (2 wheels);BSC/3in1;Other (comment) (May already have a RW)    Recommendations for Other Services OT consult     Precautions / Restrictions Precautions Precautions: Knee Precaution Booklet Issued: Yes (comment) Precaution Comments: Pt educated to not allow any pillow or bolster under knee for healing with optimal range of motion.  Required Braces or Orthoses: Knee Immobilizer - Left Knee Immobilizer - Left: Other (comment) (KI in room; not needed this session) Restrictions LLE Weight Bearing: Weight bearing as tolerated     Mobility  Bed Mobility Overal bed mobility: Needs Assistance Bed Mobility: Supine to Sit     Supine to sit: Contact guard, Min assist Sit to supine: Min assist   General bed mobility comments: noting inefficent scooting to get closer to EOB; cues for full lateral weight shift and min assist to help scoot L hip to EOB     Transfers Overall transfer level: Needs assistance Equipment used: Rolling walker (2 wheels) Transfers: Sit to/from Stand Sit to Stand: Min assist           General transfer comment: cues for hand and foot placement/optimal technique. Approaching CGA; heavy use of momentum    Ambulation/Gait Ambulation/Gait assistance: Min assist, +2 safety/equipment Gait Distance (Feet): 5 Feet Assistive device: Rolling walker (2 wheels) Gait Pattern/deviations: Step-through pattern (emerging) Gait velocity: quite slow     General Gait Details: Cues fro sequence and step length; Nice, stable L knee in stance; distance limited by nausea   Stairs             Wheelchair Mobility     Tilt Bed    Modified Rankin (Stroke Patients Only)       Balance Overall balance assessment: Needs assistance Sitting-balance support: No upper extremity supported, Feet supported Sitting balance-Leahy Scale: Good     Standing balance support: Bilateral upper extremity supported, During functional activity Standing balance-Leahy Scale: Poor Standing balance comment: reliant on RW and intermittent external support                            Cognition Arousal: Alert, Lethargic, Suspect due to medications Behavior During Therapy: WFL for tasks assessed/performed, Flat affect Overall Cognitive Status: Within Functional Limits for tasks assessed                                 General Comments: suspect some decreased recall of  new education secondary to lethargy from medications; pt however, following commands [facility]        Exercises Total Joint Exercises Quad Sets: AROM, Left, 10 reps Short Arc Quad: AAROM, Left, 10 reps Heel Slides: AAROM, Left, 10 reps Straight Leg Raises: AAROM, Left, 10 reps Goniometric ROM: approx 70 deg to 0 deg Other Exercises Other Exercises: reviewed precautions, brace wear, and positioning of knee at rest    General Comments         Pertinent Vitals/Pain Pain Assessment Pain Assessment: Faces Faces Pain Scale: Hurts even more Pain Location: L knee with end range flexion; reports burning pain with positioning in extension at end of session Pain Descriptors / Indicators: Grimacing, Burning Pain Intervention(s): Monitored during session    Home Living                          Prior Function            PT Goals (current goals can now be found in the care plan section) Acute Rehab PT Goals Patient Stated Goal: Be able to DC home PT Goal Formulation: With patient Time For Goal Achievement: 07/05/23 Potential to Achieve Goals: Good Progress towards PT goals: Progressing toward goals (slowly)    Frequency    7X/week      PT Plan      Co-evaluation              AM-PAC PT "6 Clicks" Mobility   Outcome Measure  Help needed turning from your back to your side while in a flat bed without using bedrails?: None Help needed moving from lying on your back to sitting on the side of a flat bed without using bedrails?: A Little Help needed moving to and from a bed to a chair (including a wheelchair)?: A Little Help needed standing up from a chair using your arms (e.g., wheelchair or bedside chair)?: A Little Help needed to walk in hospital room?: A Lot Help needed climbing 3-5 steps with a railing? : A Lot 6 Click Score: 17    End of Session Equipment Utilized During Treatment: Gait belt Activity Tolerance: Other (comment) (amb distance limited by nausea this afternoon) Patient left: in chair;with call bell/phone within reach Nurse Communication: Mobility status (Nausea) PT Visit Diagnosis: Unsteadiness on feet (R26.81);Other abnormalities of gait and mobility (R26.89);Pain;Muscle weakness (generalized) (M62.81) Pain - Right/Left: Left Pain - part of body: Knee     Time: 1610-9604 PT Time Calculation (min) (ACUTE ONLY): 33 min  Charges:    $Gait Training: 8-22 mins $Therapeutic  Exercise: 8-22 mins PT General Charges $$ ACUTE PT VISIT: 1 Visit                     Van Clines, PT  Acute Rehabilitation Services Office [phone] Secure Chat welcomed    Levi Aland 06/21/2023, 2:05 PM

## 2023-06-21 NOTE — Evaluation (Signed)
Occupational Therapy Evaluation Patient Details Name: Rhonda Hickman MRN: [id number] DOB: 1955-07-29 Today's Date: 06/21/2023   History of Present Illness Pt is a 67 y.o. female s/p L cemented TKA. PMH signficiant for lumbar radiculopathy, a-fib, HLD, HTN, bipolar 1 disorder, TIA, vertigo, asthma, GERD, COPD, DM, osteoarthritis of R knee.   Clinical Impression   PTA, pt lived alone and had PCA 3 hours/day for 7 days/week who assisted with IADL, bathing, and occasionally dressing. Pt predominantly using Jazzy chair for mobility for some time now secondary to knee pain. Upon eval, pt requires min A for transfers, ambulation, and LB ADL; approaching CGA. Educated regarding AE for LB ADL, pt able to verbalize appropriate technique for tub transfer with bench. Reviewed recommendations for positioning at rest, brace wear, and precautions. Will continue to follow acutely. Recommending discharge home with increased support from aide (to stay overnight more frequently) and family as well as HHOT.   Pt to benefit from one more night stay acutely to optimize carryover of education and mobility prior to return home. Pt needs BSC as she is at an increased risk for falls during night time trips to the restroom.      If plan is discharge home, recommend the following: A little help with walking and/or transfers;Assistance with cooking/housework;A lot of help with bathing/dressing/bathroom;Help with stairs or ramp for entrance;Assist for transportation    Functional Status Assessment  Patient has had a recent decline in their functional status and demonstrates the ability to make significant improvements in function in a reasonable and predictable amount of time.  Equipment Recommendations  BSC/3in1    Recommendations for Other Services       Precautions / Restrictions Precautions Precautions: Knee Precaution Booklet Issued: Yes (comment) Precaution Comments: provided by PT entering room. All  precautions reviewed within context of ADL and mobility Required Braces or Orthoses: Knee Immobilizer - Left Restrictions Weight Bearing Restrictions: Yes LLE Weight Bearing: Weight bearing as tolerated      Mobility Bed Mobility Overal bed mobility: Needs Assistance Bed Mobility: Supine to Sit, Sit to Supine     Supine to sit: Contact guard Sit to supine: Min assist   General bed mobility comments: to bring LLE into bed    Transfers Overall transfer level: Needs assistance Equipment used: Rolling walker (2 wheels) Transfers: Sit to/from Stand Sit to Stand: Min assist           General transfer comment: cues for hand and foot placement/optimal technique. Approaching CGA; heavy use of momentum up from recliner chair      Balance Overall balance assessment: Needs assistance Sitting-balance support: No upper extremity supported, Feet supported Sitting balance-Leahy Scale: Good     Standing balance support: Bilateral upper extremity supported, During functional activity Standing balance-Leahy Scale: Poor Standing balance comment: reliant on RW and intermittent external support                           ADL either performed or assessed with clinical judgement   ADL Overall ADL's : Needs assistance/impaired Eating/Feeding: Modified independent   Grooming: Set up;Sitting   Upper Body Bathing: Set up;Sitting   Lower Body Bathing: Minimal assistance;Sit to/from stand   Upper Body Dressing : Set up;Sitting   Lower Body Dressing: Minimal assistance;Sit to/from stand;With adaptive equipment Lower Body Dressing Details (indicate cue type and reason): educated re: AE Toilet Transfer: Minimal assistance;Ambulation;Rolling walker (2 wheels);BSC/3in1   Toileting- Clothing Manipulation and Hygiene: Contact guard assist;Sitting/lateral  lean     Tub/Shower Transfer Details (indicate cue type and reason): deferred this session to optimize ambulatory mobility  first; pt able to verbalize how she uses her tub bench at home and provide corresonding simulated demo Functional mobility during ADLs: Minimal assistance;Rolling walker (2 wheels) General ADL Comments: Min A for RW management during functional mobility/     Vision Ability to See in Adequate Light: 0 Adequate Patient Visual Report: No change from baseline Vision Assessment?: No apparent visual deficits     Perception Perception: Not tested       Praxis Praxis: Not tested       Pertinent Vitals/Pain Pain Assessment Pain Assessment: No/denies pain     Extremity/Trunk Assessment Upper Extremity Assessment Upper Extremity Assessment: Overall WFL for tasks assessed (not formally assessed)   Lower Extremity Assessment Lower Extremity Assessment: Defer to PT evaluation       Communication Communication Communication: No apparent difficulties;Other (comment) (occasional increased cues secondary to elthargy) Cueing Techniques: Verbal cues   Cognition Arousal: Alert, Lethargic, Suspect due to medications Behavior During Therapy: WFL for tasks assessed/performed, Flat affect Overall Cognitive Status: Within Functional Limits for tasks assessed                                 General Comments: suspect some decreased recall of new education secondary to lethargy from medications; pt however, following commands WFL     General Comments  BP 130/79 (94) HR 75 seated after inital bout of gait; BP 112/98 (104) HR 208? with STS up from chair. Positive for dizziness and eye closing; suspect due to both effort and medications.    Exercises Exercises: Other exercises Other Exercises Other Exercises: reviewed precautions, brace wear, and positioning of knee at rest   Shoulder Instructions      Home Living Family/patient expects to be discharged to:: Private residence Living Arrangements: Alone Available Help at Discharge: Personal care attendant (7 days/week, 3 hours/day  and often stays at night per pt (can stay more often as well)) Type of Home: Apartment Home Access: Level entry     Home Layout: One level     Bathroom Shower/Tub: Chief Strategy Officer: Standard Bathroom Accessibility: Yes   Home Equipment: Agricultural consultant (2 wheels);Toilet riser;Tub bench;Grab bars - tub/shower;Adaptive equipment;Hand held shower head Fransico Him chair) Adaptive Equipment: Reacher;Other (Comment) (uses back scratcher as dressing stick)        Prior Functioning/Environment Prior Level of Function : Needs assist             Mobility Comments: Recent use of jazzy chair in home with very limited ambulation ADLs Comments: PCA asissts with bathing every other day, shower tranfers, occasional LB ADL. PCA also asissts with meals, cleaning.        OT Problem List: Decreased strength;Decreased activity tolerance;Impaired balance (sitting and/or standing);Decreased safety awareness;Decreased knowledge of use of DME or AE;Decreased knowledge of precautions      OT Treatment/Interventions: Self-care/ADL training;Therapeutic exercise;DME and/or AE instruction;Balance training;Patient/family education;Therapeutic activities    OT Goals(Current goals can be found in the care plan section) Acute Rehab OT Goals Patient Stated Goal: go home OT Goal Formulation: With patient Time For Goal Achievement: 07/05/23 Potential to Achieve Goals: Good  OT Frequency: Min 1X/week    Co-evaluation              AM-PAC OT "6 Clicks" Daily Activity     Outcome Measure  Help from another person eating meals?: None Help from another person taking care of personal grooming?: A Little Help from another person toileting, which includes using toliet, bedpan, or urinal?: A Little Help from another person bathing (including washing, rinsing, drying)?: A Lot Help from another person to put on and taking off regular upper body clothing?: A Little Help from another person to put  on and taking off regular lower body clothing?: A Little 6 Click Score: 18   End of Session Equipment Utilized During Treatment: Gait belt;Rolling walker (2 wheels);Left knee immobilizer Nurse Communication: Mobility status  Activity Tolerance: Patient tolerated treatment well;Patient limited by lethargy Patient left: in chair;with call bell/phone within reach;Other (comment) (PT in room)  OT Visit Diagnosis: Unsteadiness on feet (R26.81);Muscle weakness (generalized) (M62.81);Other abnormalities of gait and mobility (R26.89)                Time: 6295-2841 OT Time Calculation (min): 21 min Charges:  OT General Charges $OT Visit: 1 Visit OT Evaluation $OT Eval Moderate Complexity: 1 Mod  Rhonda Hickman, OTR/L [facility] Acute Rehabilitation Office: [phone]   Rhonda Hickman 06/21/2023, 10:00 AM

## 2023-06-21 NOTE — Inpatient Diabetes Management (Signed)
Inpatient Diabetes Program Recommendations  AACE/ADA: New Consensus Statement on Inpatient Glycemic Control (2015)  Target Ranges:  Prepandial:   less than 140 mg/dL      Peak postprandial:   less than 180 mg/dL (1-2 hours)      Critically ill patients:  140 - 180 mg/dL      Review of Glycemic Control  Diabetes history: DM2 Outpatient Diabetes medications: Lantus 24 BID, Humalog 0-12 units TID, Januvia 1000 mg daiy, metformin 1000 BID, Ozempic 0.5 mg weekly Current orders for Inpatient glycemic control: Semglee 20 BID, Novolog 0-20 TID with meals and 0-5 HS + 20 units BID with meals, tradjenta 5 mg daily, metformin 1000 mg BID  HgbA1C - 6.8% - good glycemic control PTA Hypo yesterday of 59 CBGs today 180, 185  Inpatient Diabetes Program Recommendations:    Discontinue Novolog 20 units BID with meals (pt not taking at home per EMR)  Continue to follow while inpatient.  Thank you. Ailene Ards, RD, LDN, CDCES Inpatient Diabetes Coordinator [phone]

## 2023-06-22 ENCOUNTER — Other Ambulatory Visit (HOSPITAL_COMMUNITY): Payer: Self-pay

## 2023-06-22 DIAGNOSIS — M1712 Unilateral primary osteoarthritis, left knee: Secondary | ICD-10-CM | POA: Diagnosis not present

## 2023-06-22 LAB — CBC
HCT: 28.4 % — ABNORMAL LOW (ref 36.0–46.0)
Hemoglobin: 9.3 g/dL — ABNORMAL LOW (ref 12.0–15.0)
MCH: 28.1 pg (ref 26.0–34.0)
MCHC: 32.7 g/dL (ref 30.0–36.0)
MCV: 85.8 fL (ref 80.0–100.0)
Platelets: 229 10*3/uL (ref 150–400)
RBC: 3.31 MIL/uL — ABNORMAL LOW (ref 3.87–5.11)
RDW: 14.6 % (ref 11.5–15.5)
WBC: 13.7 10*3/uL — ABNORMAL HIGH (ref 4.0–10.5)
nRBC: 0 % (ref 0.0–0.2)

## 2023-06-22 LAB — GLUCOSE, CAPILLARY: Glucose-Capillary: 169 mg/dL — ABNORMAL HIGH (ref 70–99)

## 2023-06-22 MED ORDER — METHOCARBAMOL 500 MG PO TABS: 500.0000 mg | ORAL_TABLET | Freq: Four times a day (QID) | ORAL | 0 refills | Status: DC | PRN

## 2023-06-22 MED ORDER — GABAPENTIN 100 MG PO CAPS: 100.0000 mg | ORAL_CAPSULE | Freq: Three times a day (TID) | ORAL | 0 refills | Status: DC

## 2023-06-22 MED ORDER — GABAPENTIN 100 MG PO CAPS
100.0000 mg | ORAL_CAPSULE | Freq: Three times a day (TID) | ORAL | 0 refills | Status: DC
  Filled 2023-06-22: qty 60, 20d supply, fill #0

## 2023-06-22 MED ORDER — OXYCODONE HCL 5 MG PO TABS: 5.0000 mg | ORAL_TABLET | ORAL | 0 refills | Status: DC | PRN

## 2023-06-22 MED ORDER — OXYCODONE HCL 5 MG PO TABS
5.0000 mg | ORAL_TABLET | ORAL | 0 refills | Status: DC | PRN
  Filled 2023-06-22: qty 30, 3d supply, fill #0

## 2023-06-22 MED ORDER — METHOCARBAMOL 500 MG PO TABS
500.0000 mg | ORAL_TABLET | Freq: Four times a day (QID) | ORAL | 0 refills | Status: DC | PRN
  Filled 2023-06-22: qty 40, 10d supply, fill #0

## 2023-06-22 NOTE — Plan of Care (Signed)
Pt given D/C instructions with verbal understanding. Rx's were sent to the pharmacy by MD. Pt's incision is clean and dry with no sign of infection. Dressing was changed prior to D/C. An extra dressing was given to the Pt. Home Health was arranged by TOC. Pt D/C'd home via wheelchair per MD order. Pt is stable @ D/C and has no other needs at this time. Rema Fendt, RN

## 2023-06-22 NOTE — Discharge Summary (Signed)
Patient ID: Rhonda Hickman MRN: [id number] DOB/AGE: 1956-03-27 67 y.o.  Admit date: 06/20/2023 Discharge date: 06/22/2023  Admission Diagnoses:  Principal Problem:   Unilateral primary osteoarthritis, left knee Active Problems:   OA (osteoarthritis) of knee   Status post total left knee replacement   Discharge Diagnoses:  Same  Past Medical History:  Diagnosis Date   Anemia    Anxiety    Arthritis    Asthma    Atrial fibrillation (HCC)    Bipolar 1 disorder (HCC)    Bulging lumbar disc    Chronic pain of left knee    COPD (chronic obstructive pulmonary disease) (HCC)    CVA (cerebral vascular accident) (HCC)    Diabetes mellitus    GERD (gastroesophageal reflux disease)    Neuropathy    TIA (transient ischemic attack)    Vertigo     Surgeries: Procedure(s): LEFT TOTAL KNEE ARTHROPLASTY on 06/20/2023   Consultants:   Discharged Condition: Improved  Hospital Course: Rhonda Hickman is an 67 y.o. female who was admitted 06/20/2023 for operative treatment ofUnilateral primary osteoarthritis, left knee. Patient has severe unremitting pain that affects sleep, daily activities, and work/hobbies. After pre-op clearance the patient was taken to the operating room on 06/20/2023 and underwent  Procedure(s): LEFT TOTAL KNEE ARTHROPLASTY.    Patient was given perioperative antibiotics:  Anti-infectives (From admission, onward)    Start     Dose/Rate Route Frequency Ordered Stop   06/20/23 0930  ceFAZolin (ANCEF) 3 g in sodium chloride 0.9 % 100 mL IVPB        3 g 200 mL/hr over 30 Minutes Intravenous On call to O.R. 06/20/23 1191 06/20/23 1448        Patient was given sequential compression devices, early ambulation, and chemoprophylaxis to prevent DVT.  Patient benefited maximally from hospital stay and there were no complications.    Recent vital signs: Patient Vitals for the past 24 hrs:  BP Temp Temp src Pulse Resp SpO2  06/22/23 0837 117/73 -- -- 84 16 100 %   06/22/23 0700 -- 98.5 F (36.9 C) Oral -- -- --  06/22/23 0403 (!) 157/73 (!) 100.5 F (38.1 C) Oral 85 20 100 %  06/21/23 2311 (!) 150/79 99.5 F (37.5 C) Oral 82 18 96 %  06/21/23 2022 139/81 99.8 F (37.7 C) -- 92 18 96 %  06/21/23 1622 (!) 142/78 98.6 F (37 C) Oral 77 20 96 %  06/21/23 1148 (!) 141/79 98 F (36.7 C) Oral 75 20 98 %     Recent laboratory studies:  Recent Labs    06/21/23 0656 06/22/23 0903  WBC 11.7* 13.7*  HGB 11.2* 9.3*  HCT 34.3* 28.4*  PLT 250 229  NA 136  --   K 4.4  --   CL 102  --   CO2 25  --   BUN 11  --   CREATININE 1.09*  --   GLUCOSE 207*  --   CALCIUM 9.2  --      Discharge Medications:   Allergies as of 06/22/2023       Reactions   Aspirin Nausea Only, Other (See Comments)   Causes stomach pain        Medication List     STOP taking these medications    HYDROcodone-acetaminophen 10-325 MG tablet Commonly known as: NORCO       TAKE these medications    Accu-Chek Guide test strip Generic drug: glucose blood CHECK BLOOD SUGAR UP TO FOUR TIMES  DAILY AS DIRECTED   Advocate Insulin Syringe 29G X 1/2" 0.3 ML Misc Generic drug: Insulin Syringe-Needle U-100 Use to inject insulin 5x daily.   amLODipine 5 MG tablet Commonly known as: NORVASC TAKE 1 TABLET BY MOUTH DAILY   blood glucose meter kit and supplies Dispense based on patient and insurance preference. Use up to four times daily as directed. (FOR ICD-10 E10.9, E11.9).   buPROPion 150 MG 12 hr tablet Commonly known as: WELLBUTRIN SR TAKE ONE TABLET BY MOUTH TWICE DAILY @ 9AM & 9PM   divalproex 500 MG 24 hr tablet Commonly known as: DEPAKOTE ER Take 500-1,000 mg by mouth See admin instructions. Take 500 mg in the morning and 1000 mg at bedtime   FeroSul 325 (65 Fe) MG tablet Generic drug: ferrous sulfate TAKE ONE TABLET BY MOUTH DAILY AT 9AM WITH BREAKFAST   gabapentin 100 MG capsule Commonly known as: NEURONTIN Take 1 capsule (100 mg total) by mouth  3 (three) times daily.   GNP UltiCare Pen Needles 32G X 4 MM Misc Generic drug: Insulin Pen Needle USE TO INJECT INSULIN UP TO 5 TIMES A DAY   insulin lispro 100 UNIT/ML KwikPen Commonly known as: HUMALOG Inject 20 Units into the skin 2 (two) times daily. What changed:  how much to take when to take this reasons to take this additional instructions   Januvia 100 MG tablet Generic drug: sitaGLIPtin Take 1 tablet (100 mg total) by mouth daily.   Lantus SoloStar 100 UNIT/ML Solostar Pen Generic drug: insulin glargine INJECT SUBCUTANEOUSLY 20 UNITS  TWICE DAILY What changed: See the new instructions.   metFORMIN 1000 MG tablet Commonly known as: GLUCOPHAGE Take 1 tablet (1,000 mg total) by mouth 2 (two) times daily. TAKE ONE TABLET BY MOUTH TWICE DAILY @ 9AM & 5PM WITH MEALS   methocarbamol 500 MG tablet Commonly known as: ROBAXIN Take 1 tablet (500 mg total) by mouth every 6 (six) hours as needed for muscle spasms.   metoprolol tartrate 50 MG tablet Commonly known as: LOPRESSOR TAKE ONE TABLET BY MOUTH TWICE DAILY @ 9AM & 5PM   oxybutynin 5 MG tablet Commonly known as: DITROPAN TAKE 1 TABLET BY MOUTH 2 TO 3  TIMES DAILY FOR BLADDER SPASMS   oxyCODONE 5 MG immediate release tablet Commonly known as: Oxy IR/ROXICODONE Take 1-2 tablets (5-10 mg total) by mouth every 4 (four) hours as needed for moderate pain (pain score 4-6) (pain score 4-6).   Ozempic (0.25 or 0.5 MG/DOSE) 2 MG/1.5ML Sopn Generic drug: Semaglutide(0.25 or 0.5MG /DOS) Inject 0.5 mg into the skin once a week.   rosuvastatin 10 MG tablet Commonly known as: CRESTOR Take 1 tablet (10 mg total) by mouth daily.   Symbicort 160-4.5 MCG/ACT inhaler Generic drug: budesonide-formoterol INHALE TWO PUFFS BY MOUTH INTO LUNGS TWICE DAILY What changed: See the new instructions.   Xarelto 20 MG Tabs tablet Generic drug: rivaroxaban TAKE ONE TABLET BY MOUTH DAILY AT 5PM WITH SUPPER               Durable  Medical Equipment  (From admission, onward)           Start     Ordered   06/20/23 1831  DME 3 n 1  Once        06/20/23 1830   06/20/23 1831  DME Walker rolling  Once       Question Answer Comment  Walker: With 5 Inch Wheels   Patient needs a walker to treat with the  following condition Status post total left knee replacement      06/20/23 1830            Diagnostic Studies: DG Knee 1-2 Views Left Result Date: 06/20/2023 CLINICAL DATA:  Status post left knee replacement EXAM: LEFT KNEE - 2 VIEW COMPARISON:  02/23/2023 FINDINGS: Left knee replacement is noted in satisfactory position. No acute soft tissue abnormality is seen. IMPRESSION: Status post left knee replacement. Electronically Signed   By: Alcide Clever M.D.   On: 06/20/2023 21:16    Disposition: Discharge disposition: 01-Home or Self Care          Follow-up Information     Kathryne Hitch, MD Follow up in 2 week(s).   Specialty: Orthopedic Surgery Contact information: [address] [place] [address] [phone]         [facility] home [facility] Follow up.   Why: The home health agency will contact you for the first home visit. Contact information: [phone]                 Signed: Kathryne Hitch 06/22/2023, 11:19 AM

## 2023-06-22 NOTE — Progress Notes (Signed)
Subjective: 2 Days Post-Op Procedure(s) (LRB): LEFT TOTAL KNEE ARTHROPLASTY (Left) Patient reports pain as moderate.    Objective: Vital signs in last 24 hours: Temp:  [98 F (36.7 C)-100.5 F (38.1 C)] 100.5 F (38.1 C) (12/12 0403) Pulse Rate:  [75-92] 85 (12/12 0403) Resp:  [18-20] 20 (12/12 0403) BP: (139-157)/(73-81) 157/73 (12/12 0403) SpO2:  [96 %-100 %] 100 % (12/12 0403)  Intake/Output from previous day: 12/11 0701 - 12/12 0700 In: 240 [P.O.:240] Out: 250 [Urine:250] Intake/Output this shift: No intake/output data recorded.  Recent Labs    06/21/23 0656  HGB 11.2*   Recent Labs    06/21/23 0656  WBC 11.7*  RBC 3.99  HCT 34.3*  PLT 250   Recent Labs    06/21/23 0656  NA 136  K 4.4  CL 102  CO2 25  BUN 11  CREATININE 1.09*  GLUCOSE 207*  CALCIUM 9.2   No results for input(s): "LABPT", "INR" in the last 72 hours.  Sensation intact distally Intact pulses distally Dorsiflexion/Plantar flexion intact Incision: scant drainage Compartment soft   Assessment/Plan: 2 Days Post-Op Procedure(s) (LRB): LEFT TOTAL KNEE ARTHROPLASTY (Left) Up with therapy Discharge home with home health      Rhonda Hickman 06/22/2023, 6:51 AM

## 2023-06-22 NOTE — Progress Notes (Signed)
Physical Therapy Treatment Patient Details Name: Rhonda Hickman MRN: [id number] DOB: 1955/08/03 Today's Date: 06/22/2023   History of Present Illness Pt is a 67 y.o. female s/p L cemented TKA 12/10. PMH signficiant for lumbar radiculopathy, a-fib, HLD, HTN, bipolar 1 disorder, TIA, vertigo, asthma, GERD, COPD, DM, osteoarthritis of R knee.    PT Comments  The pt was agreeable to session but presents with significant lethargy and fatigue this morning. She needed significantly increased time to respond to all cues and questions, but was generally able to attempt to follow instructions after delay. The pt required increased assist to complete all sit-stand transfers (minA to steady RW and modA to lift hips from bed) as well as modA of 2 to manage safe lower to her personal wheelchair as pt attempting to sit prior to having wheelchair behind her and needing increased assist to manage WC and keep her upright prior to sitting. The pt was limited to ~10 ft walking, taking standing rest breaks every 2-3 steps with forearms on RW, needing modA to steady and keep upright. The pt demos poor functional use and ROM of LLE, minimal wt acceptance, and generally poor endurance. Is at high risk of falls with return home, attempted to educate to limit mobility to time when aide is present to provide assistance. Continue to recommend follow up PT to improve functional ROM of L knee, strength, and general endurance.     If plan is discharge home, recommend the following: A little help with bathing/dressing/bathroom;Assistance with cooking/housework;A lot of help with walking and/or transfers;Assist for transportation;Help with stairs or ramp for entrance   Can travel by private vehicle        Equipment Recommendations  BSC/3in1;Rollator (4 wheels)    Recommendations for Other Services       Precautions / Restrictions Precautions Precautions: Knee Precaution Booklet Issued: Yes (comment) Precaution Comments:  discussed not placing pillow under knee, pt lethargic and did not verbalize understanding Required Braces or Orthoses: Knee Immobilizer - Left Knee Immobilizer - Left: Other (comment) (KI in room, not used this session due to pt impulsivity, knee maintained flexed) Restrictions Weight Bearing Restrictions Per Provider Order: Yes LLE Weight Bearing Per Provider Order: Weight bearing as tolerated     Mobility  Bed Mobility Overal bed mobility: Needs Assistance             General bed mobility comments: pt sitting EOB with OT upon my arrival    Transfers Overall transfer level: Needs assistance Equipment used: Rolling walker (2 wheels) Transfers: Sit to/from Stand Sit to Stand: Mod assist           General transfer comment: min to steady RW and modA to assist with elevation of pt. pt attemtped x10 with no success using rocking/momentum to attempt standing prior to being given assist for successful stand. also needing significant assist to lower to chair safely    Ambulation/Gait Ambulation/Gait assistance: +2 safety/equipment, Mod assist (chair follow) Gait Distance (Feet): 10 Feet Assistive device: Rolling walker (2 wheels) Gait Pattern/deviations: Step-through pattern, Knee flexed in stance - right, Knee flexed in stance - left, Shuffle, Trunk flexed, Decreased stride length Gait velocity: quite slow Gait velocity interpretation: <1.31 ft/sec, indicative of household ambulator   General Gait Details: pt with signifiant trunk flexion, often resting with forearms on RW until cued not to. Pt not picking up either feet, maintains L knee flexed. standing rest after every 2-3 steps. modA to maintain upright + max cues  Balance Overall balance assessment: Needs assistance Sitting-balance support: No upper extremity supported, Feet supported Sitting balance-Leahy Scale: Good     Standing balance support: Bilateral upper extremity supported, During functional  activity Standing balance-Leahy Scale: Poor Standing balance comment: reliant on RW and intermittent external support                            Cognition Arousal: Lethargic, Suspect due to medications Behavior During Therapy: Flat affect Overall Cognitive Status: Impaired/Different from baseline Area of Impairment: Following commands, Awareness, Safety/judgement, Problem solving                       Following Commands: Follows one step commands inconsistently, Follows one step commands with increased time Safety/Judgement: Decreased awareness of safety, Decreased awareness of deficits Awareness: Intellectual Problem Solving: Slow processing, Decreased initiation, Difficulty sequencing, Requires verbal cues General Comments: pt mostly asleep, opening eyes briefly with max stimulation. responding to questions with significant dely. pt remains oriented despite fatigue. no insight to safety, falls risk, or need for assistance        Exercises Total Joint Exercises Long Arc Quad: AAROM, Left, 5 reps Other Exercises Other Exercises: reviewed precautions, brace wear, only walking with aide present, and positioning of knee at rest, pt did not verbalize understanding    General Comments General comments (skin integrity, edema, etc.): pt lethargic, maintains eyes closed most of session. BP 111/57 (71) after gait      Pertinent Vitals/Pain Pain Assessment Pain Assessment: Faces Faces Pain Scale: Hurts a little bit Pain Location: pt mostly sleeping with eyes closed, grimace with L knee flexion Pain Descriptors / Indicators: Discomfort Pain Intervention(s): Limited activity within patient's tolerance, Monitored during session, Repositioned    Home Living Family/patient expects to be discharged to:: Private residence Living Arrangements: Alone Available Help at Discharge: Personal care attendant (7 days/week, 3 hours/day and often stays at night per pt (can stay more  often as well)) Type of Home: Apartment Home Access: Level entry       Home Layout: One level Home Equipment: Agricultural consultant (2 wheels);Toilet riser;Tub bench;Grab bars - tub/shower;Adaptive equipment;Hand held shower head Fransico Him chair)          PT Goals (current goals can now be found in the care plan section) Acute Rehab PT Goals Patient Stated Goal: Be able to DC home PT Goal Formulation: With patient Time For Goal Achievement: 07/05/23 Potential to Achieve Goals: Good Progress towards PT goals: Progressing toward goals    Frequency    7X/week       AM-PAC PT "6 Clicks" Mobility   Outcome Measure  Help needed turning from your back to your side while in a flat bed without using bedrails?: None Help needed moving from lying on your back to sitting on the side of a flat bed without using bedrails?: A Little Help needed moving to and from a bed to a chair (including a wheelchair)?: A Little Help needed standing up from a chair using your arms (e.g., wheelchair or bedside chair)?: A Lot Help needed to walk in hospital room?: Total (<20 ft) Help needed climbing 3-5 steps with a railing? : Total 6 Click Score: 14    End of Session Equipment Utilized During Treatment: Gait belt Activity Tolerance: Patient limited by fatigue;Patient limited by lethargy;Patient limited by pain Patient left: in chair;with call bell/phone within reach (personal wheelchair) Nurse Communication: Mobility status PT Visit Diagnosis: Unsteadiness  on feet (R26.81);Other abnormalities of gait and mobility (R26.89);Pain;Muscle weakness (generalized) (M62.81) Pain - Right/Left: Left Pain - part of body: Knee     Time: 2951-8841 PT Time Calculation (min) (ACUTE ONLY): 25 min  Charges:    $Gait Training: 8-22 mins $Therapeutic Exercise: 8-22 mins PT General Charges $$ ACUTE PT VISIT: 1 Visit                     Vickki Muff, PT, DPT   Acute Rehabilitation Department Office  [phone] Secure Chat Communication Preferred   Ronnie Derby 06/22/2023, 9:20 AM

## 2023-06-22 NOTE — Progress Notes (Signed)
Occupational Therapy Treatment Patient Details Name: Rhonda Hickman MRN: [id number] DOB: 1956-03-21 Today's Date: 06/22/2023   History of present illness Pt is a 67 y.o. female s/p L cemented TKA 12/10. PMH signficiant for lumbar radiculopathy, a-fib, HLD, HTN, bipolar 1 disorder, TIA, vertigo, asthma, GERD, COPD, DM, osteoarthritis of R knee.   OT comments  Pt progressing slowly toward established OT goals. More pain today and continued mobility and conversation with eyes closed (presumably does this at baseline). Pt needing mod A for LB ADL; fair carryover of education provided yesterday with use of compensatory techniques. Cues for sustaining and attention to task. Max education to optimize safety and home setting. Handoff to PT at end of session. Continue to recommend maximized support at home with pt refusing inpatient rehab.       If plan is discharge home, recommend the following:  A little help with walking and/or transfers;Assistance with cooking/housework;A lot of help with bathing/dressing/bathroom;Help with stairs or ramp for entrance;Assist for transportation   Equipment Recommendations  BSC/3in1    Recommendations for Other Services      Precautions / Restrictions Precautions Precautions: Knee Precaution Booklet Issued: Yes (comment) Required Braces or Orthoses: Knee Immobilizer - Left Restrictions Weight Bearing Restrictions Per Provider Order: Yes LLE Weight Bearing Per Provider Order: Weight bearing as tolerated       Mobility Bed Mobility Overal bed mobility: Needs Assistance Bed Mobility: Supine to Sit     Supine to sit: Contact guard, Min assist          Transfers Overall transfer level: Needs assistance Equipment used: Rolling walker (2 wheels) Transfers: Sit to/from Stand Sit to Stand: Mod assist           General transfer comment: min to steady RW and modA to assist with elevation of pt. pt attemtped x10 with no success using  rocking/momentum to attempt standing prior to being given assist for successful stand. also needing significant assist to lower to chair safely. impulsive toiday and poor body mechanics despite max cues     Balance Overall balance assessment: Needs assistance Sitting-balance support: No upper extremity supported, Feet supported Sitting balance-Leahy Scale: Good     Standing balance support: Bilateral upper extremity supported, During functional activity Standing balance-Leahy Scale: Poor Standing balance comment: reliant on RW and intermittent external support                           ADL either performed or assessed with clinical judgement   ADL Overall ADL's : Needs assistance/impaired                     Lower Body Dressing: Moderate assistance;Sit to/from stand Lower Body Dressing Details (indicate cue type and reason): mod A for shoes and pulling pants all the way up Toilet Transfer: Minimal assistance;Ambulation;Rolling walker (2 wheels);BSC/3in1 Toilet Transfer Details (indicate cue type and reason): heavy reliance on momentum for STS                Extremity/Trunk Assessment              Vision       Perception     Praxis      Cognition Arousal: Lethargic, Suspect due to medications Behavior During Therapy: Flat affect Overall Cognitive Status: Impaired/Different from baseline Area of Impairment: Following commands, Awareness, Safety/judgement, Problem solving  Following Commands: Follows one step commands inconsistently, Follows one step commands with increased time Safety/Judgement: Decreased awareness of safety, Decreased awareness of deficits Awareness: Intellectual Problem Solving: Slow processing, Decreased initiation, Difficulty sequencing, Requires verbal cues General Comments: Pt with intermittent lethargy. Needing constant cues to attend to task with max self initiated rest breaks but  questionable if needed or due to pt with slower pace of ADL at baseline ("she reports she just takes her time") with things. Suspect like this at baseline        Exercises Other Exercises Other Exercises: reviewed precautions, brace wear, only walking with aide present, and positioning of knee at rest    Shoulder Instructions       General Comments Pt reports sister to stay for several days to assist    Pertinent Vitals/ Pain       Pain Assessment Pain Assessment: Faces Faces Pain Scale: Hurts a little bit Pain Location: pt mostly sleeping with eyes closed, grimace with L knee flexion Pain Descriptors / Indicators: Discomfort Pain Intervention(s): Limited activity within patient's tolerance, Monitored during session  Home Living                                          Prior Functioning/Environment              Frequency  Min 1X/week        Progress Toward Goals  OT Goals(current goals can now be found in the care plan section)  Progress towards OT goals: Progressing toward goals (slowly today)  Acute Rehab OT Goals Patient Stated Goal: go home OT Goal Formulation: With patient Time For Goal Achievement: 07/05/23 Potential to Achieve Goals: Good ADL Goals Pt Will Perform Lower Body Dressing: with contact guard assist;sit to/from stand Pt Will Transfer to Toilet: with contact guard assist;ambulating Pt Will Perform Toileting - Clothing Manipulation and hygiene: with contact guard assist;sit to/from stand Pt Will Perform Tub/Shower Transfer: with contact guard assist;rolling walker;ambulating;tub bench  Plan      Co-evaluation                 AM-PAC OT "6 Clicks" Daily Activity     Outcome Measure   Help from another person eating meals?: None Help from another person taking care of personal grooming?: A Little Help from another person toileting, which includes using toliet, bedpan, or urinal?: A Little Help from another person  bathing (including washing, rinsing, drying)?: A Lot Help from another person to put on and taking off regular upper body clothing?: A Little Help from another person to put on and taking off regular lower body clothing?: A Little 6 Click Score: 18    End of Session Equipment Utilized During Treatment: Gait belt;Rolling walker (2 wheels);Left knee immobilizer  OT Visit Diagnosis: Unsteadiness on feet (R26.81);Muscle weakness (generalized) (M62.81);Other abnormalities of gait and mobility (R26.89)   Activity Tolerance Patient tolerated treatment well;Patient limited by lethargy   Patient Left  (chair with PT)   Nurse Communication Mobility status        Time: 1610-9604 OT Time Calculation (min): 34 min  Charges: OT General Charges $OT Visit: 1 Visit OT Treatments $Self Care/Home Management : 23-37 mins  Tyler Deis, OTR/L [facility] Acute Rehabilitation Office: [phone]   Myrla Halsted 06/22/2023, 1:03 PM

## 2023-06-23 ENCOUNTER — Telehealth: Payer: Self-pay | Admitting: Orthopaedic Surgery

## 2023-06-23 NOTE — Telephone Encounter (Signed)
Sister aware that she would have to go back to the hospital in order to be sent to facility

## 2023-06-23 NOTE — Telephone Encounter (Signed)
Patient's sister called. She says now patient is wanting to go to a skilled nursing facility. Family is not able to help her. She isn't able to sit up on her own. Kathie Rhodes would like a call [phone]

## 2023-06-28 ENCOUNTER — Telehealth: Payer: Self-pay

## 2023-06-28 ENCOUNTER — Emergency Department (HOSPITAL_COMMUNITY)
Admission: EM | Admit: 2023-06-28 | Discharge: 2023-06-30 | Disposition: A | Discharge status: Home / Self Care | Payer: 59 | Attending: Emergency Medicine | Admitting: Emergency Medicine

## 2023-06-28 ENCOUNTER — Telehealth (INDEPENDENT_AMBULATORY_CARE_PROVIDER_SITE_OTHER): Payer: Self-pay | Admitting: Primary Care

## 2023-06-28 DIAGNOSIS — D649 Anemia, unspecified: Secondary | ICD-10-CM | POA: Diagnosis not present

## 2023-06-28 DIAGNOSIS — J449 Chronic obstructive pulmonary disease, unspecified: Secondary | ICD-10-CM | POA: Diagnosis not present

## 2023-06-28 DIAGNOSIS — E119 Type 2 diabetes mellitus without complications: Secondary | ICD-10-CM | POA: Diagnosis not present

## 2023-06-28 DIAGNOSIS — Z7984 Long term (current) use of oral hypoglycemic drugs: Secondary | ICD-10-CM | POA: Insufficient documentation

## 2023-06-28 DIAGNOSIS — J45909 Unspecified asthma, uncomplicated: Secondary | ICD-10-CM | POA: Insufficient documentation

## 2023-06-28 DIAGNOSIS — Z96652 Presence of left artificial knee joint: Secondary | ICD-10-CM | POA: Diagnosis not present

## 2023-06-28 DIAGNOSIS — Z79899 Other long term (current) drug therapy: Secondary | ICD-10-CM | POA: Diagnosis not present

## 2023-06-28 DIAGNOSIS — Z794 Long term (current) use of insulin: Secondary | ICD-10-CM | POA: Diagnosis not present

## 2023-06-28 DIAGNOSIS — R627 Adult failure to thrive: Secondary | ICD-10-CM | POA: Insufficient documentation

## 2023-06-28 DIAGNOSIS — Z91148 Patient's other noncompliance with medication regimen for other reason: Secondary | ICD-10-CM | POA: Insufficient documentation

## 2023-06-28 DIAGNOSIS — M25562 Pain in left knee: Secondary | ICD-10-CM | POA: Diagnosis not present

## 2023-06-28 DIAGNOSIS — R5383 Other fatigue: Secondary | ICD-10-CM | POA: Diagnosis present

## 2023-06-28 DIAGNOSIS — Z7901 Long term (current) use of anticoagulants: Secondary | ICD-10-CM | POA: Diagnosis not present

## 2023-06-28 DIAGNOSIS — I4891 Unspecified atrial fibrillation: Secondary | ICD-10-CM | POA: Insufficient documentation

## 2023-06-28 DIAGNOSIS — M7989 Other specified soft tissue disorders: Secondary | ICD-10-CM | POA: Diagnosis not present

## 2023-06-28 LAB — COMPREHENSIVE METABOLIC PANEL
ALT: 15 U/L (ref 0–44)
AST: 13 U/L — ABNORMAL LOW (ref 15–41)
Albumin: 2.5 g/dL — ABNORMAL LOW (ref 3.5–5.0)
Alkaline Phosphatase: 49 U/L (ref 38–126)
Anion gap: 12 (ref 5–15)
BUN: 7 mg/dL — ABNORMAL LOW (ref 8–23)
CO2: 28 mmol/L (ref 22–32)
Calcium: 8.8 mg/dL — ABNORMAL LOW (ref 8.9–10.3)
Chloride: 98 mmol/L (ref 98–111)
Creatinine, Ser: 1.03 mg/dL — ABNORMAL HIGH (ref 0.44–1.00)
GFR, Estimated: 60 mL/min — ABNORMAL LOW (ref >60)
Glucose, Bld: 132 mg/dL — ABNORMAL HIGH (ref 70–99)
Potassium: 3.1 mmol/L — ABNORMAL LOW (ref 3.5–5.1)
Sodium: 138 mmol/L (ref 135–145)
Total Bilirubin: 0.6 mg/dL (ref <1.2)
Total Protein: 6.7 g/dL (ref 6.5–8.1)

## 2023-06-28 LAB — CBC WITH DIFFERENTIAL/PLATELET
Abs Immature Granulocytes: 0.13 10*3/uL — ABNORMAL HIGH (ref 0.00–0.07)
Basophils Absolute: 0 10*3/uL (ref 0.0–0.1)
Basophils Relative: 0 %
Eosinophils Absolute: 0.1 10*3/uL (ref 0.0–0.5)
Eosinophils Relative: 1 %
HCT: 24.8 % — ABNORMAL LOW (ref 36.0–46.0)
Hemoglobin: 8.1 g/dL — ABNORMAL LOW (ref 12.0–15.0)
Immature Granulocytes: 1 %
Lymphocytes Relative: 36 %
Lymphs Abs: 4.1 10*3/uL — ABNORMAL HIGH (ref 0.7–4.0)
MCH: 28.2 pg (ref 26.0–34.0)
MCHC: 32.7 g/dL (ref 30.0–36.0)
MCV: 86.4 fL (ref 80.0–100.0)
Monocytes Absolute: 1.3 10*3/uL — ABNORMAL HIGH (ref 0.1–1.0)
Monocytes Relative: 12 %
Neutro Abs: 5.7 10*3/uL (ref 1.7–7.7)
Neutrophils Relative %: 50 %
Platelets: 425 10*3/uL — ABNORMAL HIGH (ref 150–400)
RBC: 2.87 MIL/uL — ABNORMAL LOW (ref 3.87–5.11)
RDW: 14.6 % (ref 11.5–15.5)
WBC: 11.3 10*3/uL — ABNORMAL HIGH (ref 4.0–10.5)
nRBC: 0.2 % (ref 0.0–0.2)

## 2023-06-28 LAB — URINALYSIS, ROUTINE W REFLEX MICROSCOPIC
Bilirubin Urine: NEGATIVE
Glucose, UA: NEGATIVE mg/dL
Hgb urine dipstick: NEGATIVE
Ketones, ur: NEGATIVE mg/dL
Leukocytes,Ua: NEGATIVE
Nitrite: NEGATIVE
Protein, ur: NEGATIVE mg/dL
Specific Gravity, Urine: 1.012 (ref 1.005–1.030)
pH: 6 (ref 5.0–8.0)

## 2023-06-28 LAB — POC OCCULT BLOOD, ED: Fecal Occult Bld: NEGATIVE

## 2023-06-28 LAB — CBG MONITORING, ED: Glucose-Capillary: 241 mg/dL — ABNORMAL HIGH (ref 70–99)

## 2023-06-28 MED ORDER — DIVALPROEX SODIUM 500 MG PO DR TAB
500.0000 mg | DELAYED_RELEASE_TABLET | Freq: Every day | ORAL | Status: DC
  Administered 2023-06-29 – 2023-06-30 (×2): 500 mg via ORAL
  Filled 2023-06-28 (×2): qty 2

## 2023-06-28 MED ORDER — RIVAROXABAN 10 MG PO TABS
20.0000 mg | ORAL_TABLET | Freq: Every day | ORAL | Status: DC
Start: 2023-06-29 — End: 2023-07-01
  Administered 2023-06-29 – 2023-06-30 (×2): 20 mg via ORAL
  Filled 2023-06-28 (×2): qty 2

## 2023-06-28 MED ORDER — FERROUS SULFATE 325 (65 FE) MG PO TABS
325.0000 mg | ORAL_TABLET | Freq: Every day | ORAL | Status: DC
  Administered 2023-06-29 – 2023-06-30 (×2): 325 mg via ORAL
  Filled 2023-06-28 (×2): qty 1

## 2023-06-28 MED ORDER — METFORMIN HCL 500 MG PO TABS
1000.0000 mg | ORAL_TABLET | Freq: Two times a day (BID) | ORAL | Status: DC
  Administered 2023-06-29 – 2023-06-30 (×4): 1000 mg via ORAL
  Filled 2023-06-28 (×4): qty 2

## 2023-06-28 MED ORDER — OXYCODONE HCL 5 MG PO TABS
5.0000 mg | ORAL_TABLET | ORAL | Status: DC | PRN
  Administered 2023-06-29 – 2023-06-30 (×3): 5 mg via ORAL
  Filled 2023-06-28 (×3): qty 1

## 2023-06-28 MED ORDER — DIVALPROEX SODIUM 500 MG PO DR TAB
1000.0000 mg | DELAYED_RELEASE_TABLET | Freq: Every day | ORAL | Status: DC
  Administered 2023-06-29 (×2): 1000 mg via ORAL
  Filled 2023-06-28: qty 4
  Filled 2023-06-28: qty 2

## 2023-06-28 MED ORDER — GABAPENTIN 100 MG PO CAPS
100.0000 mg | ORAL_CAPSULE | Freq: Three times a day (TID) | ORAL | Status: DC
  Administered 2023-06-28 – 2023-06-30 (×6): 100 mg via ORAL
  Filled 2023-06-28 (×6): qty 1

## 2023-06-28 MED ORDER — BUDESONIDE 0.5 MG/2ML IN SUSP
0.5000 mg | Freq: Two times a day (BID) | RESPIRATORY_TRACT | Status: DC
  Filled 2023-06-28: qty 2

## 2023-06-28 MED ORDER — ROSUVASTATIN CALCIUM 5 MG PO TABS
10.0000 mg | ORAL_TABLET | Freq: Every day | ORAL | Status: DC
  Administered 2023-06-29 – 2023-06-30 (×2): 10 mg via ORAL
  Filled 2023-06-28 (×2): qty 2

## 2023-06-28 MED ORDER — ONDANSETRON HCL 4 MG/2ML IJ SOLN
4.0000 mg | Freq: Four times a day (QID) | INTRAMUSCULAR | Status: DC | PRN
  Administered 2023-06-28: 4 mg via INTRAVENOUS
  Filled 2023-06-28: qty 2

## 2023-06-28 MED ORDER — AMLODIPINE BESYLATE 5 MG PO TABS
5.0000 mg | ORAL_TABLET | Freq: Every day | ORAL | Status: DC
  Administered 2023-06-29 – 2023-06-30 (×2): 5 mg via ORAL
  Filled 2023-06-28 (×2): qty 1

## 2023-06-28 MED ORDER — METOPROLOL TARTRATE 5 MG/5ML IV SOLN
5.0000 mg | INTRAVENOUS | Status: DC | PRN
  Administered 2023-06-28: 5 mg via INTRAVENOUS
  Filled 2023-06-28 (×2): qty 5

## 2023-06-28 MED ORDER — HYDROMORPHONE HCL 1 MG/ML IJ SOLN
0.5000 mg | Freq: Once | INTRAMUSCULAR | Status: AC
  Administered 2023-06-28: 0.5 mg via INTRAVENOUS
  Filled 2023-06-28: qty 1

## 2023-06-28 MED ORDER — METHOCARBAMOL 500 MG PO TABS
500.0000 mg | ORAL_TABLET | Freq: Four times a day (QID) | ORAL | Status: DC | PRN
  Administered 2023-06-29 – 2023-06-30 (×2): 500 mg via ORAL
  Filled 2023-06-28 (×2): qty 1

## 2023-06-28 MED ORDER — METOPROLOL TARTRATE 25 MG PO TABS: 50.0000 mg | ORAL_TABLET | Freq: Two times a day (BID) | ORAL | Status: DC

## 2023-06-28 MED ORDER — BUPROPION HCL ER (SR) 150 MG PO TB12
150.0000 mg | ORAL_TABLET | Freq: Two times a day (BID) | ORAL | Status: DC
  Administered 2023-06-29 – 2023-06-30 (×3): 150 mg via ORAL
  Filled 2023-06-28 (×3): qty 1

## 2023-06-28 MED ORDER — INSULIN ASPART 100 UNIT/ML IJ SOLN
0.0000 [IU] | Freq: Three times a day (TID) | INTRAMUSCULAR | Status: DC
Start: 2023-06-29 — End: 2023-07-01
  Administered 2023-06-29 – 2023-06-30 (×4): 3 [IU] via SUBCUTANEOUS

## 2023-06-28 MED ORDER — ACETAMINOPHEN 500 MG PO TABS
1000.0000 mg | ORAL_TABLET | Freq: Once | ORAL | Status: AC
  Administered 2023-06-28: 1000 mg via ORAL
  Filled 2023-06-28: qty 2

## 2023-06-28 MED ORDER — OXYCODONE HCL 5 MG PO TABS: 5.0000 mg | ORAL_TABLET | Freq: Once | ORAL | Status: DC

## 2023-06-28 MED ORDER — METOPROLOL TARTRATE 25 MG PO TABS
100.0000 mg | ORAL_TABLET | Freq: Two times a day (BID) | ORAL | Status: DC
Start: 2023-06-29 — End: 2023-07-01
  Administered 2023-06-29 – 2023-06-30 (×3): 100 mg via ORAL
  Filled 2023-06-28 (×3): qty 4

## 2023-06-28 NOTE — TOC Initial Note (Signed)
Transition of Care [facility]) - Initial/Assessment Note    Patient Details  Name: Rhonda Hickman MRN: [id number] Date of Birth: [birth year]  Transition of Care [facility]) CM/SW Contact:    Susa Simmonds, LCSWA Phone Number: 06/28/2023, 10:47 PM  Clinical Narrative:   CSW spoke with patient who stated she had knee replacement surgery on December 10th. Patient stated she hasn't been able to get around on her own. Patient states she needs SNF placement prior to discharging back home. Patient states she lives in [place], but is opened to Skilled nursing facilities in the [place] if she meets criteria. CSW explained physical therapy will come evaluate her in the morning.               Expected Discharge Plan: Skilled Nursing Facility Barriers to Discharge: Continued Medical Work up   Patient Goals and CMS Choice Patient states their goals for this hospitalization and ongoing recovery are:: SNF          Expected Discharge Plan and Services                                              Prior Living Arrangements/Services   Lives with:: Self   Do you feel safe going back to the place where you live?: Yes      Need for Family Participation in Patient Care: Yes (Comment) Care giver support system in place?: Yes (comment)   Criminal Activity/Legal Involvement Pertinent to Current Situation/Hospitalization: No - Comment as needed  Activities of Daily Living      Permission Sought/Granted                  Emotional Assessment   Attitude/Demeanor/Rapport: Engaged Affect (typically observed): Calm Orientation: : Oriented to Self, Oriented to Place, Oriented to  Time, Oriented to Situation Alcohol / Substance Use: Not Applicable Psych Involvement: No (comment)  Admission diagnosis:  knee infection Patient Active Problem List   Diagnosis Date Noted   OA (osteoarthritis) of knee 06/20/2023   Status post total left knee replacement 06/20/2023   Unilateral  primary osteoarthritis, left knee 03/20/2023   Lumbar radiculopathy 02/01/2023   S/P left knee arthroscopy11/29/22 07/13/2021   Tear of meniscus of knee joint    Acute lower UTI 05/23/2021   A-fib (HCC) 05/22/2021   Leukocytosis 05/22/2021   Fall at home, initial encounter 05/22/2021    Class: Acute   HLD (hyperlipidemia) 05/22/2021   Hypertension 03/18/2020   Bipolar 1 disorder (HCC)    Chronic atrial fibrillation (HCC) 12/03/2019   TIA (transient ischemic attack) 12/03/2019   Diabetic peripheral neuropathy (HCC) 12/03/2019   Chronic migraine 12/03/2019   Vertigo 08/03/2019   Bronchiectasis with acute exacerbation (HCC)    Obesity, Class III, BMI 40-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) (HCC)    Gastroesophageal reflux disease    Asthma exacerbation 06/06/2018   Gait abnormality 08/29/2017   Paresthesia 08/29/2017   Derangement of posterior horn of medial meniscus of right knee    Meniscus, lateral, derangement, right    Unilateral primary osteoarthritis, right knee    COPD with acute exacerbation (HCC) 03/09/2016   Type 2 diabetes mellitus (HCC) 02/16/2007   Cocaine abuse (HCC) 02/16/2007   Extrinsic asthma 02/16/2007   HOMELESSNESS, HX OF 02/16/2007   PCP:  Grayce Sessions, NP Pharmacy:   [facility] [address] [place] [address] [place] [address] Phone: [phone] Fax: [phone]  [facility] - [place], [place] - [address] [place] [address] Phone: [phone] Fax: [phone]     Social Drivers of Health (SDOH) Social History: SDOH Screenings   Depression [phone]): Low Risk  (02/09/2023)  Tobacco Use: High Risk (06/16/2023)   SDOH Interventions:     Readmission Risk Interventions     No data to display

## 2023-06-28 NOTE — ED Triage Notes (Signed)
[facility]. Pt had a left total knee replacement on the 10th of this month and in the last two days she has been unable to sleep, fatigue, swelling increased on left knee discharge and pus noted with ems  Pain level at 9/10, A/Ox4, usually ambulatory but after surgery she has not been walking.

## 2023-06-28 NOTE — Telephone Encounter (Signed)
[name], pt's Peer Support advisor is calling in because pt needs someone to help take care of her due to her knee replacements. [name] is requesting Marcelino Duster give her a call immediately because pt does not want to do rehab but she needs help in the home. Please follow up with [facility]. [name] says this is an extremely urgent matter.

## 2023-06-28 NOTE — ED Provider Notes (Incomplete)
[place] EMERGENCY DEPARTMENT AT [facility] Provider Note   CSN: [id number] Arrival date & time: 06/28/23  1658     History {Add pertinent medical, surgical, social history, OB history to HPI:1} Chief Complaint  Patient presents with   Knee Injury    Rhonda Hickman is a 67 y.o. female with PMH as listed below who presents BIB [facility] from [facility] home. 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 Magnus Ivan and in the last two days she has been unable to sleep d/t severe pain in L knee, also a/w fatigue, increased L knee swelling. She endorses discharge from the incision. Denies fevers but endorses chills. No chest pain, SOB, cough, abdominal pain, N/V/D, urinary sxs. Endorses pain level at 9/10 in the left knee. Usually ambulatory but she has not been walking since the surgery. States she is unable to care for herself at home. Friend at bedside states she has no one who can come help her. States it was recommended for her to got to rehab or SNF after surgery but she had refused, and she regrets that choice now. Hasn't taken any of her medications in a couple of days.  Past Medical History:  Diagnosis Date   Anemia    Anxiety    Arthritis    Asthma    Atrial fibrillation (HCC)    Bipolar 1 disorder (HCC)    Bulging lumbar disc    Chronic pain of left knee    COPD (chronic obstructive pulmonary disease) (HCC)    CVA (cerebral vascular accident) (HCC)    Diabetes mellitus    GERD (gastroesophageal reflux disease)    Neuropathy    TIA (transient ischemic attack)    Vertigo        Home Medications Prior to Admission medications   Medication Sig Start Date End Date Taking? Authorizing Provider  ACCU-CHEK GUIDE test strip CHECK BLOOD SUGAR UP TO FOUR TIMES DAILY AS DIRECTED 12/02/22   Grayce Sessions, NP  amLODipine (NORVASC) 5 MG tablet TAKE 1 TABLET BY MOUTH DAILY 05/28/23   Grayce Sessions, NP  blood glucose meter kit and supplies Dispense  based on patient and insurance preference. Use up to four times daily as directed. (FOR ICD-10 E10.9, E11.9). 08/03/21   Grayce Sessions, NP  buPROPion [facility] SR) 150 MG 12 hr tablet TAKE ONE TABLET BY MOUTH TWICE DAILY @ 9AM & 9PM 08/14/22   Grayce Sessions, NP  divalproex (DEPAKOTE ER) 500 MG 24 hr tablet Take 500-1,000 mg by mouth See admin instructions. Take 500 mg in the morning and 1000 mg at bedtime 05/19/21   [provider]  FEROSUL 325 (65 Fe) MG tablet TAKE ONE TABLET BY MOUTH DAILY AT 9AM WITH BREAKFAST 02/16/23   Grayce Sessions, NP  gabapentin (NEURONTIN) 100 MG capsule Take 1 capsule (100 mg total) by mouth 3 (three) times daily. 06/22/23   Kathryne Hitch, MD  GNP ULTICARE PEN NEEDLES 32G X 4 MM MISC USE TO INJECT INSULIN UP TO 5 TIMES A DAY 05/24/23   Grayce Sessions, NP  insulin lispro (HUMALOG) 100 UNIT/ML KwikPen Inject 20 Units into the skin 2 (two) times daily. Patient taking differently: Inject 0-12 Units into the skin 2 (two) times daily as needed (If blood sugar is over 150). Sliding scale 0-150= None 151-200=2 201-250=4 251-300=6 301-350=8 351-400=10 401=12 units and contact doctor 03/02/23   Grayce Sessions, NP  Insulin Syringe-Needle U-100 (ADVOCATE INSULIN SYRINGE)  29G X 1/2" 0.3 ML MISC Use to inject insulin 5x daily. 09/07/20   Grayce Sessions, NP  JANUVIA 100 MG tablet Take 1 tablet (100 mg total) by mouth daily. 04/25/23   Grayce Sessions, NP  LANTUS SOLOSTAR 100 UNIT/ML Solostar Pen INJECT SUBCUTANEOUSLY 20 UNITS  TWICE DAILY Patient taking differently: Inject 24 Units into the skin 2 (two) times daily. 02/20/23   Grayce Sessions, NP  metFORMIN (GLUCOPHAGE) 1000 MG tablet Take 1 tablet (1,000 mg total) by mouth 2 (two) times daily. TAKE ONE TABLET BY MOUTH TWICE DAILY @ 9AM & 5PM WITH MEALS 05/22/23   Grayce Sessions, NP  methocarbamol (ROBAXIN) 500 MG tablet Take 1 tablet (500 mg total) by mouth every 6 (six)  hours as needed for muscle spasms. 06/22/23   Kathryne Hitch, MD  metoprolol tartrate (LOPRESSOR) 50 MG tablet TAKE ONE TABLET BY MOUTH TWICE DAILY @ 9AM & 5PM 05/19/23   Little Ishikawa, MD  oxybutynin (DITROPAN) 5 MG tablet TAKE 1 TABLET BY MOUTH 2 TO 3  TIMES DAILY FOR BLADDER SPASMS 05/28/23   Grayce Sessions, NP  oxyCODONE (OXY IR/ROXICODONE) 5 MG immediate release tablet Take 1-2 tablets (5-10 mg total) by mouth every 4 (four) hours as needed for moderate pain (pain score 4-6) (pain score 4-6). 06/22/23   Kathryne Hitch, MD  rosuvastatin (CRESTOR) 10 MG tablet Take 1 tablet (10 mg total) by mouth daily. 04/21/23   Little Ishikawa, MD  Semaglutide,0.25 or 0.5MG /DOS, (OZEMPIC, 0.25 OR 0.5 MG/DOSE,) 2 MG/1.5ML SOPN Inject 0.5 mg into the skin once a week.    [provider]  SYMBICORT 160-4.5 MCG/ACT inhaler INHALE TWO PUFFS BY MOUTH INTO LUNGS TWICE DAILY 06/20/23   Grayce Sessions, NP  XARELTO 20 MG TABS tablet TAKE ONE TABLET BY MOUTH DAILY AT 5PM WITH SUPPER 03/10/23   Grayce Sessions, NP      Allergies    Aspirin    Review of Systems   Review of Systems A 10 point review of systems was performed and is negative unless otherwise reported in HPI.  Physical Exam Updated Vital Signs BP 101/79   Pulse 77   Temp 98.6 F (37 C) (Oral)   Resp 16   Ht 6' (1.829 m)   Wt 117.9 kg   SpO2 100%   BMI 35.26 kg/m  Physical Exam General: Normal appearing {Desc; female/female:11659}, lying in bed.  HEENT: PERRLA, Sclera anicteric, MMM, trachea midline.  Cardiology: RRR, no murmurs/rubs/gallops. BL radial and DP pulses equal bilaterally.  Resp: Normal respiratory rate and effort. CTAB, no wheezes, rhonchi, crackles.  Abd: Soft, non-tender, non-distended. No rebound tenderness or guarding.  GU: Deferred. MSK: No peripheral edema or signs of trauma. Extremities without deformity or TTP. No cyanosis or clubbing. Skin: warm, dry. No rashes or  lesions. Back: No CVA tenderness Neuro: A&Ox4, CNs II-XII grossly intact. MAEs. Sensation grossly intact.  Psych: Normal mood and affect.   ED Results / Procedures / Treatments   Labs (all labs ordered are listed, but only abnormal results are displayed) Labs Reviewed  CBC WITH DIFFERENTIAL/PLATELET - Abnormal; Notable for the following components:      Result Value   WBC 11.3 (*)    RBC 2.87 (*)    Hemoglobin 8.1 (*)    HCT 24.8 (*)    Platelets 425 (*)    Lymphs Abs 4.1 (*)    Monocytes Absolute 1.3 (*)    Abs Immature Granulocytes  0.13 (*)    All other components within normal limits  COMPREHENSIVE METABOLIC PANEL - Abnormal; Notable for the following components:   Potassium 3.1 (*)    Glucose, Bld 132 (*)    BUN 7 (*)    Creatinine, Ser 1.03 (*)    Calcium 8.8 (*)    Albumin 2.5 (*)    AST 13 (*)    GFR, Estimated 60 (*)    All other components within normal limits  URINALYSIS, ROUTINE W REFLEX MICROSCOPIC - Abnormal; Notable for the following components:   APPearance HAZY (*)    All other components within normal limits  POC OCCULT BLOOD, ED  CBG MONITORING, ED    EKG EKG Interpretation Date/Time:  Wednesday June 28 2023 17:16:00 EST Ventricular Rate:  76 PR Interval:  151 QRS Duration:  87 QT Interval:  387 QTC Calculation: 436 R Axis:   6  Text Interpretation: Sinus rhythm Borderline T wave abnormalities Confirmed by Vivi Barrack [phone]) on 06/28/2023 5:26:27 PM  Radiology No results found.  Procedures Procedures  {Document cardiac monitor, telemetry assessment procedure when appropriate:1}  Medications Ordered in ED Medications  ondansetron (ZOFRAN) injection 4 mg (4 mg Intravenous Given 06/28/23 1808)  amLODipine (NORVASC) tablet 5 mg (has no administration in time range)  buPROPion (WELLBUTRIN SR) 12 hr tablet 150 mg (has no administration in time range)  gabapentin (NEURONTIN) capsule 100 mg (has no administration in time range)  metFORMIN  (GLUCOPHAGE) tablet 1,000 mg (has no administration in time range)  oxyCODONE (Oxy IR/ROXICODONE) immediate release tablet 5 mg (has no administration in time range)  rivaroxaban (XARELTO) tablet 20 mg (has no administration in time range)  metoprolol tartrate (LOPRESSOR) tablet 100 mg (has no administration in time range)  rosuvastatin (CRESTOR) tablet 10 mg (has no administration in time range)  methocarbamol (ROBAXIN) tablet 500 mg (has no administration in time range)  insulin aspart (novoLOG) injection 0-15 Units (has no administration in time range)  divalproex (DEPAKOTE) DR tablet 500 mg (has no administration in time range)  divalproex (DEPAKOTE) DR tablet 1,000 mg (has no administration in time range)  ferrous sulfate tablet 325 mg (has no administration in time range)  budesonide (PULMICORT) nebulizer solution 0.5 mg (has no administration in time range)  HYDROmorphone (DILAUDID) injection 0.5 mg (0.5 mg Intravenous Given 06/28/23 1809)  acetaminophen (TYLENOL) tablet 1,000 mg (1,000 mg Oral Given 06/28/23 2018)    ED Course/ Medical Decision Making/ A&P                          Medical Decision Making Amount and/or Complexity of Data Reviewed Labs: ordered. Decision-making details documented in ED Course.  Risk OTC drugs. Prescription drug management.    This patient presents to the ED for concern of ***, this involves an extensive number of treatment options, and is a complaint that carries with it a high risk of complications and morbidity.  I considered the following differential and admission for this acute, potentially life threatening condition.   MDM:    ***  Clinical Course as of 06/28/23 2324  Wed Jun 28, 2023  1853 Hemoglobin(!): 8.1 Decreased from 12 --> 11 --> 9 --> 8 in 12 days.  [HN]  1853 WBC(!): 11.3 Decreasing leukocytosis [HN]  1926 HR now Afib w/ RVR in 130s-140s. Will give metoprolol IV.  [HN]  2024 Urinalysis, Routine w reflex microscopic  -Urine, Clean Catch(!) Neg UTI [HN]  2134 Secretary had paged incorrect  orthopedics group. Will page [facility] group call. [HN]  2140 D/w Dr. Ophelia Charter who states that her pain control is likely related to opiate pain prescription medication taken prior to surgery. Recommends more elevation and more ice. Recommends that she call Dr. Magnus Ivan in the AM. States that  [HN]  2157 Fecal Occult Blood, POC: NEGATIVE [HN]  2157 D/w patient and her friend at bedside. Patient cannot care for herself at home. She states she cannot walk. SNF was recommended to her at DC but she had refused it and regrets it now. She is asking to be placed in a SNF. Patient is aware that it may take some time but states that she is not safe to go home and has no one to help her. Will order her home meds and consult to [facility] for placement. [HN]  2240 Ordered PT/OT [HN]  2313 Home meds ordered, including xarelto and iron. Glucose monitoring ordered. [HN]    Clinical Course User Index [HN] Loetta Rough, MD    Labs: I Ordered, and personally interpreted labs.  The pertinent results include:  those listed above  Additional history obtained from chart review, friend at bedside  Cardiac Monitoring: The patient was maintained on a cardiac monitor.  I personally viewed and interpreted the cardiac monitored which showed an underlying rhythm of: atrial fibrillation  Reevaluation: After the interventions noted above, I reevaluated the patient and found that they have :improved  Social Determinants of Health: Lives independently  Disposition:  Boarder status pending TOC/OT/PT. Home meds ordered.   Co morbidities that complicate the patient evaluation  Past Medical History:  Diagnosis Date   Anemia    Anxiety    Arthritis    Asthma    Atrial fibrillation (HCC)    Bipolar 1 disorder (HCC)    Bulging lumbar disc    Chronic pain of left knee    COPD (chronic obstructive pulmonary disease) (HCC)    CVA (cerebral vascular  accident) (HCC)    Diabetes mellitus    GERD (gastroesophageal reflux disease)    Neuropathy    TIA (transient ischemic attack)    Vertigo      Medicines Meds ordered this encounter  Medications   HYDROmorphone (DILAUDID) injection 0.5 mg   ondansetron (ZOFRAN) injection 4 mg   DISCONTD: oxyCODONE (Oxy IR/ROXICODONE) immediate release tablet 5 mg    Refill:  0   acetaminophen (TYLENOL) tablet 1,000 mg   DISCONTD: metoprolol tartrate (LOPRESSOR) tablet 50 mg   DISCONTD: metoprolol tartrate (LOPRESSOR) injection 5 mg   amLODipine (NORVASC) tablet 5 mg   buPROPion (WELLBUTRIN SR) 12 hr tablet 150 mg   gabapentin (NEURONTIN) capsule 100 mg   metFORMIN (GLUCOPHAGE) tablet 1,000 mg   oxyCODONE (Oxy IR/ROXICODONE) immediate release tablet 5 mg    Refill:  0   rivaroxaban (XARELTO) tablet 20 mg   metoprolol tartrate (LOPRESSOR) tablet 100 mg   rosuvastatin (CRESTOR) tablet 10 mg   methocarbamol (ROBAXIN) tablet 500 mg   insulin aspart (novoLOG) injection 0-15 Units    Correction coverage::   Moderate (average weight, post-op)    CBG < 70::   Implement Hypoglycemia Standing Orders and refer to Hypoglycemia Standing Orders sidebar report    CBG 70 - 120::   0 units    CBG 121 - 150::   2 units    CBG 151 - 200::   3 units    CBG 201 - 250::   5 units    CBG 251 - 300::  8 units    CBG 301 - 350::   11 units    CBG 351 - 400::   15 units    CBG > 400:   call MD and obtain STAT lab verification   divalproex (DEPAKOTE) DR tablet 500 mg   divalproex (DEPAKOTE) DR tablet 1,000 mg   ferrous sulfate tablet 325 mg   budesonide (PULMICORT) nebulizer solution 0.5 mg    I have reviewed the patients home medicines and have made adjustments as needed  Problem List / ED Course: Problem List Items Addressed This Visit   None Visit Diagnoses       Acute pain of left knee    -  Primary     Failure to thrive in adult                {Document critical care time when  appropriate:1} {Document review of labs and clinical decision tools ie heart score, Chads2Vasc2 etc:1}  {Document your independent review of radiology images, and any outside records:1} {Document your discussion with family members, caretakers, and with consultants:1} {Document social determinants of health affecting pt's care:1} {Document your decision making why or why not admission, treatments were needed:1}  This note was created using dictation software, which may contain spelling or grammatical errors.

## 2023-06-28 NOTE — Telephone Encounter (Signed)
From the [facility] call:  The patient explained that she needs to be in a SNF. She said her sister is staying with her but will be leaving shortly to return to [facility]. She stated she refused home health services because she needs to be in a SNF. She said she needs help with everything. She has been using her walker and has not been using the power wheelchair. She said she refused SNF when hospitalized becasue she thought her family would leave her there.   She then put her peer support worker, Quita Skye on the phone and the patient gave me authorization to speak with [name] about her medical needs. [name] said that the patient is not able to do anything for herself. The patient is currently at [facility] in [facility]. She said that the patient cannot stay alone and needs to get into a SNF today. [name] said she contacted all SNFs in [facility] and there are no beds available. She said she has also contacted SNFs in [place] and is currently working with [facility] to have the patient admitted there. [name] has contacted [facility] as well and they told her that prior [name] is needed from the PCP, not the orthopedic surgeon. [name] said the patient needs to be admitted today. I told her that there are no guarantees that she will be placed today. I will need to check with inpatient rehab to see what is needed for admission. [name] gave me the contact at [facility] inpatient rehab: [name]: [phone]. [name] said that it took 3 people to get the patient to her house in [place]. [name] also said that the patient " can't move at all" and the aide that she has can't lift her. She currently has an aide for 3 hours/ day x 7 days/week.[name] requested that I call her back at [phone] with any updates.   I called [facility] and left a message requesting a call back. I need to confirm that a bed is available for the patient and inquire what is needed from PCP.

## 2023-06-28 NOTE — Transitions of Care (Post Inpatient/ED Visit) (Signed)
06/28/2023  Name: Rhonda Hickman MRN: [id number] DOB: September 07, 1955  Today's TOC FU Call Status: Today's TOC FU Call Status:: Successful TOC FU Call Completed TOC FU Call Complete Date: 06/28/23 Patient's Name and Date of Birth confirmed.  Transition Care Management Follow-up Telephone Call Date of Discharge: 06/22/23 Discharge Facility: [facility]) Type of Discharge: Inpatient Admission Primary Inpatient Discharge Diagnosis:: s/p left TKA How have you been since you were released from the hospital?: Worse Any questions or concerns?: Yes Patient Questions/Concerns:: The patient explained that she needs to be in a SNF.  She said her sister is staying with her but will be leaving shortly to return to [place].  She stated she refused home health services because she needs to be in a SNF. She said she needs help with everything. She has been using her walker and has not been using the power wheelchair. She said she refused SNF when hospitalized becasue she thought her family would leave her there.  She then put her peer support worker, Quita Skye on the phone and the patient gave me authorization to speak with Rhonda Hickman about her medical needs.  Rhonda Hickman said that the patient is not able to do anything for herself.  The patient  is currently at [facility] in [facility]. She said that the patient cannot stay alone and needs to get into a SNF today. Rhonda Hickman said she contacted all SNFs in [facility] and there are no beds available. She said she has also contacted SNFs in [facility] and is currently working with [facility] to have the patient admitted there. Rhonda Hickman has contacted [facility] as well and they told her that prior Rhonda Hickman is needed from the PCP, not the orthopedic surgeon.  Rhonda Hickman said the patient needs to be admitted today.  I told her that there are no guarantees that she will be placed today. I will need to check with inpatient rehab to see what is needed for admission. Rhonda Hickman  gave me the contact at [facility] inpatient rehab: [name]: [phone]. Rhonda Hickman said that it took 3 people to get the patient to her house in [place].  Rhonda Hickman also said that the patient " can't move at all" and the aide that she has can't lift her. She   currently has an aide for 3 hours/ day x 7 days/week.Rhonda Hickman requested that I call her back at [phone] with any updates.  I called [facility] and left a message requesting a call back. I need to confirm that a bed is available for the patient and inquire what is needed from PCP. Patient Questions/Concerns Addressed: Notified Provider of Patient Questions/Concerns, Other:  Items Reviewed: Did you receive and understand the discharge instructions provided?: Yes Medications obtained,verified, and reconciled?:  (She said she has all of her medications and did not have any questions about the med regime. She said her sister set up the meds for her and her aide helps to make sure that she takes them.  She also stated she has a glucometer) Any new allergies since your discharge?: No Dietary orders reviewed?: Yes Type of Diet Ordered:: heart healthy, low sodium, diabetic Do you have support at home?: Yes People in Home: alone Name of Support/Comfort Primary Source: [name] said she lives alone but her sister is with her now, but will be leaving to go back to [facility] soon.  Medications Reviewed Today: Medications Reviewed Today   Medications were not reviewed in this encounter  Home Care and Equipment/Supplies: Were Home Health Services Ordered?: Yes Name of Home Health Agency:: [facility] Has Agency set up a time to come to your home?: Yes First Home Health Visit Date: 06/23/23 (The PT made the initial home visit on 06/23/2023 but the patient said she declined services.  I called [facility] and spoke to [place] who confirmed that the patient declined services stating she wants to go to SNF) Any new equipment or medical supplies ordered?:  Yes Name of Medical supply agency?: [employer], RW- she received them from the hospital Were you able to get the equipment/medical supplies?: Yes Do you have any questions related to the use of the equipment/supplies?: No  Functional Questionnaire: Do you need assistance with bathing/showering or dressing?: Yes Do you need assistance with meal preparation?: Yes Do you need assistance with eating?: Yes Do you have difficulty maintaining continence: Yes Do you need assistance with getting out of bed/getting out of a chair/moving?: Yes Do you have difficulty managing or taking your medications?: Yes  Follow up appointments reviewed: PCP Follow-up appointment confirmed?: NA Specialist Hospital Follow-up appointment confirmed?: Yes Date of Specialist follow-up appointment?: 07/03/23 Follow-Up Specialty Provider:: orthopedic surgeon Do you need transportation to your follow-up appointment?: No Do you understand care options if your condition(s) worsen?: Yes-patient verbalized understanding    SIGNATURE Robyne Peers, RN

## 2023-06-28 NOTE — Telephone Encounter (Signed)
I received a call from [facility]. She explained that they do not accept direct admissions from home, the patient would need to be in the hospital and have PT and OT visits within a 48 hour time frame. She said because the patient has been home for almost a week, the previous encounters she had with PT/OT in the hospital would not count and she would need to start over with therapy in the hospital setting.    I called and left a message for [name], patient's peer support, and informed her of my conversation with [facility]. Message included a request for call back.   I tried to reach [place] again this afternoon and had to leave a message requesting a call back.

## 2023-06-29 ENCOUNTER — Encounter (HOSPITAL_COMMUNITY): Payer: Self-pay

## 2023-06-29 ENCOUNTER — Emergency Department (HOSPITAL_COMMUNITY): Payer: 59

## 2023-06-29 ENCOUNTER — Other Ambulatory Visit: Payer: Self-pay

## 2023-06-29 DIAGNOSIS — M79662 Pain in left lower leg: Secondary | ICD-10-CM

## 2023-06-29 DIAGNOSIS — D649 Anemia, unspecified: Secondary | ICD-10-CM | POA: Diagnosis not present

## 2023-06-29 LAB — CBG MONITORING, ED
Glucose-Capillary: 152 mg/dL — ABNORMAL HIGH (ref 70–99)
Glucose-Capillary: 152 mg/dL — ABNORMAL HIGH (ref 70–99)
Glucose-Capillary: 176 mg/dL — ABNORMAL HIGH (ref 70–99)
Glucose-Capillary: 190 mg/dL — ABNORMAL HIGH (ref 70–99)

## 2023-06-29 LAB — HEMOGLOBIN AND HEMATOCRIT, BLOOD
HCT: 25.5 % — ABNORMAL LOW (ref 36.0–46.0)
Hemoglobin: 7.9 g/dL — ABNORMAL LOW (ref 12.0–15.0)

## 2023-06-29 NOTE — Evaluation (Signed)
Occupational Therapy Evaluation Patient Details Name: Rhonda Hickman MRN: [id number] DOB: [birth year] Today's Date: 06/29/2023   History of Present Illness Patient is a 67 year old female with left knee pain following recent TKA on 06/20/23. Unable to care for herself at home and seeking placement for rehabilitation.   Clinical Impression   Pt currently at min assist level for transfers to the [facility] with use of the RW and for LB bathing and dressing tasks.  Prior to admission pt's sister was in from [facility] since recent L TKA and was assisting and pt has an aide 3hr/7days a week.  She was unable to safely complete ADLs and transfers after her sister went back home and when the aide is not in the home.  Feel she will benefit from acute care OT at this time to progress ADL independence to a level that is safe for discharge.  Recommend patient will benefit from continued inpatient follow up therapy, <3 hours/day post acute stay.         If plan is discharge home, recommend the following: A little help with walking and/or transfers;Assistance with cooking/housework;Help with stairs or ramp for entrance;Assist for transportation;A little help with bathing/dressing/bathroom    Functional Status Assessment  Patient has had a recent decline in their functional status and demonstrates the ability to make significant improvements in function in a reasonable and predictable amount of time.  Equipment Recommendations  None recommended by OT       Precautions / Restrictions Precautions Precautions: Knee Precaution Booklet Issued: No Restrictions Weight Bearing Restrictions Per Provider Order: No LLE Weight Bearing Per Provider Order: Weight bearing as tolerated      Mobility Bed Mobility Overal bed mobility: Needs Assistance Bed Mobility: Supine to Sit, Sit to Supine     Supine to sit: Min assist Sit to supine: Min assist   General bed mobility comments: Assist needed for managing LLE off the bed  and back on    Transfers Overall transfer level: Needs assistance Equipment used: Rolling walker (2 wheels) Transfers: Sit to/from Stand Sit to Stand: Min assist           General transfer comment: Mod instructional cueing for for hand placement      Balance Overall balance assessment: Needs assistance Sitting-balance support: No upper extremity supported, Feet supported Sitting balance-Leahy Scale: Good     Standing balance support: Bilateral upper extremity supported, During functional activity Standing balance-Leahy Scale: Poor Standing balance comment: Pt needs support on the RW for standing and mobility.                           ADL either performed or assessed with clinical judgement   ADL Overall ADL's : Needs assistance/impaired Eating/Feeding: Independent;Sitting   Grooming: Wash/dry hands;Wash/dry face;Sitting;Set up   Upper Body Bathing: Supervision/ safety;Sitting   Lower Body Bathing: Minimal assistance;Sit to/from stand   Upper Body Dressing : Set up;Sitting   Lower Body Dressing: Minimal assistance;Sit to/from stand   Toilet Transfer: Minimal assistance;Ambulation;Rolling walker (2 wheels);Requires wide/bariatric   Toileting- Clothing Manipulation and Hygiene: Minimal assistance;Sit to/from stand       Functional mobility during ADLs: Minimal assistance;Rolling walker (2 wheels) General ADL Comments: Pt overall min assist for safety with sit to stand from the EOB and the wide BSC.     Vision Baseline Vision/History: 0 No visual deficits Ability to See in Adequate Light: 0 Adequate Patient Visual Report: No change from baseline Vision  Assessment?: No apparent visual deficits     Perception Perception: Within Functional Limits       Praxis Praxis: WFL       Pertinent Vitals/Pain Pain Assessment Pain Assessment: Faces Faces Pain Scale: Hurts a little bit Pain Location: left rib area Pain Descriptors / Indicators:  Discomfort Pain Intervention(s): Limited activity within patient's tolerance, Repositioned     Extremity/Trunk Assessment Upper Extremity Assessment Upper Extremity Assessment: Generalized weakness   Lower Extremity Assessment Lower Extremity Assessment: Defer to PT evaluation LLE Deficits / Details: patient able to complete partial LAQ while sitting and stand without knee buckling.   Cervical / Trunk Assessment Cervical / Trunk Assessment: Other exceptions Cervical / Trunk Exceptions: large body habitus   Communication Communication Communication: Difficulty following commands/understanding Following commands: Follows one step commands with increased time Cueing Techniques: Verbal cues   Cognition Arousal: Alert Behavior During Therapy: Flat affect Overall Cognitive Status: No family/caregiver present to determine baseline cognitive functioning                         Following Commands: Follows one step commands consistently, Follows multi-step commands with increased time Safety/Judgement: Decreased awareness of safety   Problem Solving: Requires verbal cues General Comments: Pt oriented to place, time, and situation.  She is following multi step commands with increased time.  Mod demonstrational cueing for carryover of hand placement with sit to stand and gait sequence secondary to decreased memory.                Home Living Family/patient expects to be discharged to:: Private residence Living Arrangements: Alone Available Help at Discharge: Personal care attendant Type of Home: Apartment Home Access: Level entry     Home Layout: One level     Bathroom Shower/Tub: Chief Strategy Officer: Standard Bathroom Accessibility: Yes   Home Equipment: Agricultural consultant (2 wheels);Toilet riser;Tub bench;Grab bars - tub/shower;Adaptive equipment;Hand held shower head;BSC/3in1          Prior Functioning/Environment Prior Level of Function : Needs  assist             Mobility Comments: minimal ambulation reported since TKA. has been using rolling walker in the home and reports possibly one fall since surgery (poor historian) ADLs Comments: prior to knee surgery- PCA asissts with bathing every other day, shower tranfers, occasional LB ADL. PCA also asissts with meals, cleaning.        OT Problem List: Decreased strength;Decreased activity tolerance;Impaired balance (sitting and/or standing);Decreased knowledge of use of DME or AE;Decreased knowledge of precautions      OT Treatment/Interventions: Self-care/ADL training;Therapeutic exercise;DME and/or AE instruction;Balance training;Patient/family education;Therapeutic activities    OT Goals(Current goals can be found in the care plan section) Acute Rehab OT Goals Patient Stated Goal: Pt wants to get her leg stronger OT Goal Formulation: With patient Time For Goal Achievement: 07/05/23 Potential to Achieve Goals: Good  OT Frequency: Min 1X/week       AM-PAC OT "6 Clicks" Daily Activity     Outcome Measure Help from another person eating meals?: None Help from another person taking care of personal grooming?: A Little Help from another person toileting, which includes using toliet, bedpan, or urinal?: A Little Help from another person bathing (including washing, rinsing, drying)?: A Little Help from another person to put on and taking off regular upper body clothing?: A Little Help from another person to put on and taking off regular lower body clothing?:  A Little 6 Click Score: 19   End of Session Equipment Utilized During Treatment: Gait belt;Rolling walker (2 wheels) Nurse Communication: Mobility status  Activity Tolerance: Patient tolerated treatment well Patient left: in bed;with call bell/phone within reach  OT Visit Diagnosis: Unsteadiness on feet (R26.81);Muscle weakness (generalized) (M62.81);Other abnormalities of gait and mobility (R26.89)                 Time: 1610-9604 OT Time Calculation (min): 25 min Charges:  OT General Charges $OT Visit: 1 Visit OT Evaluation $OT Eval Moderate Complexity: 1 Mod OT Treatments $Self Care/Home Management : 8-22 mins  Perrin Maltese, OTR/L Acute Rehabilitation Services  Office [phone] 06/29/2023

## 2023-06-29 NOTE — Progress Notes (Signed)
LLE venous duplex has been completed.  Preliminary results given to [facility] [place].   Results can be found under chart review under CV PROC. 06/29/2023 9:45 AM Cameren Odwyer RVT, RDMS

## 2023-06-29 NOTE — Progress Notes (Signed)
CSW spoke with Kia at [name] who states she initiated insurance authorization for patient. Kia states she received call from patient's friend whom she is currently living with and requesting SNF placement for short term rehab. Kia states she needs new PT/OT notes to support insurance authorization.  CSW will follow patient and send new notes to [place] once available.  Edwin Dada, MSW, LCSW Transitions of Care  Clinical Social Worker II [phone]

## 2023-06-29 NOTE — NC FL2 (Signed)
[place] MEDICAID FL2 LEVEL OF CARE FORM     IDENTIFICATION  Patient Name: Kiwana Purington Birthdate: 1955-09-28 Sex: female Admission Date (Current Location): 06/28/2023  [facility] and [place] Number:  Producer, television/film/video and Address:  The . [facility], [address] [place], [place] [address]      Provider Number: [id number]  Attending Physician Name and Address:  Dr. Marlene Bast  Relative Name and Phone Number:       Current Level of Care: Hospital Recommended Level of Care: Skilled Nursing Facility Prior Approval Number:    Date Approved/Denied:   PASRR Number: [id number]  Discharge Plan: SNF    Current Diagnoses: Patient Active Problem List   Diagnosis Date Noted   OA (osteoarthritis) of knee 06/20/2023   Status post total left knee replacement 06/20/2023   Unilateral primary osteoarthritis, left knee 03/20/2023   Lumbar radiculopathy 02/01/2023   S/P left knee arthroscopy11/29/22 07/13/2021   Tear of meniscus of knee joint    Acute lower UTI 05/23/2021   A-fib (HCC) 05/22/2021   Leukocytosis 05/22/2021   Fall at home, initial encounter 05/22/2021   HLD (hyperlipidemia) 05/22/2021   Hypertension 03/18/2020   Bipolar 1 disorder (HCC)    Chronic atrial fibrillation (HCC) 12/03/2019   TIA (transient ischemic attack) 12/03/2019   Diabetic peripheral neuropathy (HCC) 12/03/2019   Chronic migraine 12/03/2019   Vertigo 08/03/2019   Bronchiectasis with acute exacerbation (HCC)    Obesity, Class III, BMI 40-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) (HCC)    Gastroesophageal reflux disease    Asthma exacerbation 06/06/2018   Gait abnormality 08/29/2017   Paresthesia 08/29/2017   Derangement of posterior horn of medial meniscus of right knee    Meniscus, lateral, derangement, right    Unilateral primary osteoarthritis, right knee    COPD with acute exacerbation (HCC) 03/09/2016   Type 2 diabetes mellitus (HCC) 02/16/2007   Cocaine abuse (HCC) 02/16/2007    Extrinsic asthma 02/16/2007   HOMELESSNESS, HX OF 02/16/2007    Orientation RESPIRATION BLADDER Height & Weight     Self, Time, Situation, Place  Normal Continent Weight: 260 lb (117.9 kg) Height:  6' (182.9 cm)  BEHAVIORAL SYMPTOMS/MOOD NEUROLOGICAL BOWEL NUTRITION STATUS      Continent Diet (See discharge)  AMBULATORY STATUS COMMUNICATION OF NEEDS Skin   Limited Assist Verbally Normal                       Personal Care Assistance Level of Assistance  Bathing, Dressing, Feeding Bathing Assistance: Limited assistance Feeding assistance: Independent Dressing Assistance: Limited assistance     Functional Limitations Info  Sight, Hearing, Speech Sight Info: Adequate Hearing Info: Adequate Speech Info: Adequate    SPECIAL CARE FACTORS FREQUENCY  PT (By licensed PT), OT (By licensed OT)     PT Frequency: 5x weekly OT Frequency: 5x weekly            Contractures Contractures Info: Not present    Additional Factors Info  Code Status, Allergies Code Status Info: Full Code Allergies Info: Aspirin           Current Medications (06/29/2023):  This is the current hospital active medication list Current Facility-Administered Medications  Medication Dose Route Frequency Provider Last Rate Last Admin   amLODipine (NORVASC) tablet 5 mg  5 mg Oral Daily Loetta Rough, MD   5 mg at 06/29/23 1026   budesonide (PULMICORT) nebulizer solution 0.5 mg  0.5 mg Inhalation BID Loetta Rough, MD  buPROPion (WELLBUTRIN SR) 12 hr tablet 150 mg  150 mg Oral BID Loetta Rough, MD   150 mg at 06/29/23 1026   divalproex (DEPAKOTE) DR tablet 1,000 mg  1,000 mg Oral QHS Loetta Rough, MD   1,000 mg at 06/29/23 0027   divalproex (DEPAKOTE) DR tablet 500 mg  500 mg Oral Daily Loetta Rough, MD   500 mg at 06/29/23 1025   ferrous sulfate tablet 325 mg  325 mg Oral Q breakfast Loetta Rough, MD   325 mg at 06/29/23 0752   gabapentin (NEURONTIN) capsule 100 mg  100 mg Oral  TID Loetta Rough, MD   100 mg at 06/29/23 1026   insulin aspart (novoLOG) injection 0-15 Units  0-15 Units Subcutaneous TID WC Loetta Rough, MD   3 Units at 06/29/23 0752   metFORMIN (GLUCOPHAGE) tablet 1,000 mg  1,000 mg Oral BID WC Loetta Rough, MD   1,000 mg at 06/29/23 6213   methocarbamol (ROBAXIN) tablet 500 mg  500 mg Oral QID PRN Loetta Rough, MD       metoprolol tartrate (LOPRESSOR) tablet 100 mg  100 mg Oral BID Loetta Rough, MD   100 mg at 06/29/23 1026   ondansetron (ZOFRAN) injection 4 mg  4 mg Intravenous Q6H PRN Loetta Rough, MD   4 mg at 06/28/23 1808   oxyCODONE (Oxy IR/ROXICODONE) immediate release tablet 5 mg  5 mg Oral Q4H PRN Loetta Rough, MD       rivaroxaban (XARELTO) tablet 20 mg  20 mg Oral Q supper Loetta Rough, MD       rosuvastatin (CRESTOR) tablet 10 mg  10 mg Oral Daily Loetta Rough, MD   10 mg at 06/29/23 1026   Current Outpatient Medications  Medication Sig Dispense Refill   ACCU-CHEK GUIDE test strip CHECK BLOOD SUGAR UP TO FOUR TIMES DAILY AS DIRECTED 100 strip 11   amLODipine (NORVASC) 5 MG tablet TAKE 1 TABLET BY MOUTH DAILY 80 tablet 3   blood glucose meter kit and supplies Dispense based on patient and insurance preference. Use up to four times daily as directed. (FOR ICD-10 E10.9, E11.9). 1 each 0   buPROPion (WELLBUTRIN SR) 150 MG 12 hr tablet TAKE ONE TABLET BY MOUTH TWICE DAILY @ 9AM & 9PM 180 tablet 11   divalproex (DEPAKOTE ER) 500 MG 24 hr tablet Take 500-1,000 mg by mouth See admin instructions. Take 500 mg in the morning and 1000 mg at bedtime     FEROSUL 325 (65 Fe) MG tablet TAKE ONE TABLET BY MOUTH DAILY AT 9AM WITH BREAKFAST 90 tablet 11   gabapentin (NEURONTIN) 100 MG capsule Take 1 capsule (100 mg total) by mouth 3 (three) times daily. 60 capsule 0   GNP ULTICARE PEN NEEDLES 32G X 4 MM MISC USE TO INJECT INSULIN UP TO 5 TIMES A DAY 200 each 6   insulin lispro (HUMALOG) 100 UNIT/ML KwikPen Inject 20 Units into the skin  2 (two) times daily. (Patient taking differently: Inject 0-12 Units into the skin 2 (two) times daily as needed (If blood sugar is over 150). Sliding scale 0-150= None 151-200=2 201-250=4 251-300=6 301-350=8 351-400=10 401=12 units and contact doctor) 15 mL 3   Insulin Syringe-Needle U-100 (ADVOCATE INSULIN SYRINGE) 29G X 1/2" 0.3 ML MISC Use to inject insulin 5x daily. 200 each 6   JANUVIA 100 MG tablet Take 1 tablet (100 mg total) by mouth daily. 90 tablet  1   LANTUS SOLOSTAR 100 UNIT/ML Solostar Pen INJECT SUBCUTANEOUSLY 20 UNITS  TWICE DAILY (Patient taking differently: Inject 24 Units into the skin 2 (two) times daily.) 45 mL 2   metFORMIN (GLUCOPHAGE) 1000 MG tablet Take 1 tablet (1,000 mg total) by mouth 2 (two) times daily. TAKE ONE TABLET BY MOUTH TWICE DAILY @ 9AM & 5PM WITH MEALS 180 tablet 1   methocarbamol (ROBAXIN) 500 MG tablet Take 1 tablet (500 mg total) by mouth every 6 (six) hours as needed for muscle spasms. 40 tablet 0   metoprolol tartrate (LOPRESSOR) 50 MG tablet TAKE ONE TABLET BY MOUTH TWICE DAILY @ 9AM & 5PM 180 tablet 3   oxybutynin (DITROPAN) 5 MG tablet TAKE 1 TABLET BY MOUTH 2 TO 3  TIMES DAILY FOR BLADDER SPASMS 180 tablet 5   oxyCODONE (OXY IR/ROXICODONE) 5 MG immediate release tablet Take 1-2 tablets (5-10 mg total) by mouth every 4 (four) hours as needed for moderate pain (pain score 4-6) (pain score 4-6). 30 tablet 0   rosuvastatin (CRESTOR) 10 MG tablet Take 1 tablet (10 mg total) by mouth daily. 90 tablet 2   Semaglutide,0.25 or 0.5MG /DOS, (OZEMPIC, 0.25 OR 0.5 MG/DOSE,) 2 MG/1.5ML SOPN Inject 0.5 mg into the skin once a week.     SYMBICORT 160-4.5 MCG/ACT inhaler INHALE TWO PUFFS BY MOUTH INTO LUNGS TWICE DAILY 10.2 g 11   XARELTO 20 MG TABS tablet TAKE ONE TABLET BY MOUTH DAILY AT 5PM WITH SUPPER 90 tablet 11     Discharge Medications: Please see discharge summary for a list of discharge medications.  Relevant Imaging Results:  Relevant Lab  Results:   Additional Information SSN: [id number]  Inis Sizer, LCSW

## 2023-06-29 NOTE — ED Provider Notes (Signed)
Emergency Medicine Observation Re-evaluation Note  Ahlivia Gains is a 67 y.o. female, seen on rounds today.  Pt initially presented to the ED for complaints of Knee Injury Currently, the patient is awaiting to see assistance for placement.  Physical Exam  BP 101/79   Pulse 77   Temp 98.6 F (37 C) (Oral)   Resp 16   Ht 6' (1.829 m)   Wt 117.9 kg   SpO2 100%   BMI 35.26 kg/m  Physical Exam General: Resting comfortably Lungs: Normal respiratory effort Psych: Calm, cooperative  ED Course / MDM  EKG:EKG Interpretation Date/Time:  Wednesday June 28 2023 17:16:00 EST Ventricular Rate:  76 PR Interval:  151 QRS Duration:  87 QT Interval:  387 QTC Calculation: 436 R Axis:   6  Text Interpretation: Sinus rhythm Borderline T wave abnormalities Confirmed by Vivi Barrack [id number] on 06/28/2023 5:26:27 PM  I have reviewed the labs performed to date as well as medications administered while in observation.  Recent changes in the last 24 hours include no changes.  Plan  Current plan is for DC evaluation after recent knee surgery.  DVT ultrasound is pending.    Laurence Spates, MD 06/29/23 [phone]

## 2023-06-29 NOTE — Evaluation (Signed)
Physical Therapy Evaluation Patient Details Name: Rhonda Hickman MRN: [id number] DOB: 1956-03-19 Today's Date: 06/29/2023  History of Present Illness  Patient is a 67 year old female with left knee pain following recent TKA on 06/20/23. Unable to care for herself at home and seeking placement for rehabilitation.  Clinical Impression  Patient is agreeable to PT evaluation. She reports difficulty managing at home since recent discharge with limited ambulation. She has DME in place at home.   Today the patient required physical assistance with bed mobility and transfers. Standing tolerance of less than one minute, limited by fatigue. Reinforced positioning of left knee to promote knee ROM. The patient would be unsafe to return home alone at this time as she is requiring physical assistance with mobility. Rehabilitation < 3 hours/day recommended after this hospital stay. PT will continue to follow to maximize independence and decrease caregiver burden.       If plan is discharge home, recommend the following: Assistance with cooking/housework;A little help with walking and/or transfers;A little help with bathing/dressing/bathroom;Help with stairs or ramp for entrance;Assist for transportation   Can travel by private vehicle   No    Equipment Recommendations None recommended by PT  Recommendations for Other Services       Functional Status Assessment Patient has had a recent decline in their functional status and demonstrates the ability to make significant improvements in function in a reasonable and predictable amount of time.     Precautions / Restrictions Precautions Precautions: Knee Precaution Booklet Issued: No Restrictions Weight Bearing Restrictions Per Provider Order: Yes LLE Weight Bearing Per Provider Order: Weight bearing as tolerated      Mobility  Bed Mobility Overal bed mobility: Needs Assistance Bed Mobility: Supine to Sit, Sit to Supine     Supine to sit: Min  assist Sit to supine: Min assist   General bed mobility comments: assistance required for management of LLE    Transfers Overall transfer level: Needs assistance Equipment used: Rolling walker (2 wheels) Transfers: Sit to/from Stand Sit to Stand: Min assist, From elevated surface (from elevated stretcher height)           General transfer comment: reinforcement of hand placement for safety    Ambulation/Gait             Pre-gait activities: standing tolerance of less than one minute, limited by fatigue. vitals stable throughout session    Stairs            Wheelchair Mobility     Tilt Bed    Modified Rankin (Stroke Patients Only)       Balance Overall balance assessment: Needs assistance Sitting-balance support: No upper extremity supported, Feet supported Sitting balance-Leahy Scale: Good     Standing balance support: Bilateral upper extremity supported, During functional activity Standing balance-Leahy Scale: Poor Standing balance comment: heavy relinace on rolling walker for support in standing. CGA for safety                             Pertinent Vitals/Pain Pain Assessment Pain Assessment: No/denies pain    Home Living Family/patient expects to be discharged to:: Private residence Living Arrangements: Alone Available Help at Discharge: Personal care attendant Type of Home: Apartment Home Access: Level entry       Home Layout: One level Home Equipment: Agricultural consultant (2 wheels);Toilet riser;Tub bench;Grab bars - tub/shower;Adaptive equipment;Hand held shower head;BSC/3in1      Prior Function Prior Level of  Function : Needs assist             Mobility Comments: minimal ambulation reported since TKA. has been using rolling walker in the home and reports possibly one fall since surgery (poor historian) ADLs Comments: prior to knee surgery- PCA asissts with bathing every other day, shower tranfers, occasional LB ADL. PCA  also asissts with meals, cleaning.     Extremity/Trunk Assessment   Upper Extremity Assessment Upper Extremity Assessment: Defer to OT evaluation    Lower Extremity Assessment Lower Extremity Assessment: LLE deficits/detail;Generalized weakness LLE Deficits / Details: patient able to complete partial LAQ while sitting and stand without knee buckling.       Communication   Communication Communication: Difficulty following commands/understanding Following commands: Follows one step commands with increased time Cueing Techniques: Verbal cues  Cognition Arousal: Lethargic Behavior During Therapy: Flat affect Overall Cognitive Status: Impaired/Different from baseline Area of Impairment: Following commands                       Following Commands: Follows one step commands with increased time     Problem Solving: Slow processing          General Comments      Exercises Total Joint Exercises Quad Sets: AROM, Strengthening, Left (x 1 reps) Goniometric ROM: L knee approximately 5-80 degrees Other Exercises Other Exercises: encouraged proper positioning/exercise of left knee to promote knee ROM   Assessment/Plan    PT Assessment Patient needs continued PT services  PT Problem List Decreased strength;Decreased range of motion;Decreased activity tolerance;Decreased balance;Decreased mobility;Decreased safety awareness;Decreased knowledge of precautions;Decreased cognition;Decreased knowledge of use of DME       PT Treatment Interventions DME instruction;Gait training;Stair training;Functional mobility training;Therapeutic activities;Therapeutic exercise;Balance training;Neuromuscular re-education;Cognitive remediation;Patient/family education;Wheelchair mobility training    PT Goals (Current goals can be found in the Care Plan section)  Acute Rehab PT Goals Patient Stated Goal: to get the therapy she needs PT Goal Formulation: With patient Time For Goal  Achievement: 07/13/23 Potential to Achieve Goals: Good    Frequency Min 1X/week     Co-evaluation               AM-PAC PT "6 Clicks" Mobility  Outcome Measure Help needed turning from your back to your side while in a flat bed without using bedrails?: A Little Help needed moving from lying on your back to sitting on the side of a flat bed without using bedrails?: A Little Help needed moving to and from a bed to a chair (including a wheelchair)?: A Little Help needed standing up from a chair using your arms (e.g., wheelchair or bedside chair)?: A Little Help needed to walk in hospital room?: A Little Help needed climbing 3-5 steps with a railing? : Total 6 Click Score: 16    End of Session   Activity Tolerance: Patient limited by fatigue Patient left: in bed;with call bell/phone within reach (patient refused stretcher rail in the "up" position) Nurse Communication: Mobility status PT Visit Diagnosis: Difficulty in walking, not elsewhere classified (R26.2);Other abnormalities of gait and mobility (R26.89)    Time: 1000-1019 PT Time Calculation (min) (ACUTE ONLY): 19 min   Charges:   PT Evaluation $PT Eval Low Complexity: 1 Low   PT General Charges $$ ACUTE PT VISIT: 1 Visit         Donna Bernard, PT, MPT   Ina Homes 06/29/2023, 10:43 AM

## 2023-06-29 NOTE — Progress Notes (Signed)
Insurance authorization approved   Plan Auth ID: [id number] Vesta Mixer ID: [id number]

## 2023-06-29 NOTE — Progress Notes (Addendum)
2:30pm: CSW spoke with patient who is in agreement to accept bed offer from [name].  CSW initiated insurance authorization.  1:45pm: CSW spoke with patient to present her with bed offers from [facility], [address], [address], [address], and [facility]. Patient requesting time to research facilities and will return call to CSW.  11am: CSW spoke with patient to discuss PT recommendations. Patient agreeable for CSW to initiate SNF workup. Patient reports she lives at home alone in [place]. Patient states she has never been to SNF before.  CSW will complete SNF work up and once bed offers are obtained, will present them to patient.  Edwin Dada, MSW, LCSW Transitions of Care  Clinical Social Worker II [phone]

## 2023-06-29 NOTE — Telephone Encounter (Signed)
In ED now.

## 2023-06-29 NOTE — ED Notes (Signed)
Patient X1 assist with bsc.

## 2023-06-29 NOTE — Telephone Encounter (Signed)
Noted  

## 2023-06-30 DIAGNOSIS — D649 Anemia, unspecified: Secondary | ICD-10-CM | POA: Diagnosis not present

## 2023-06-30 LAB — CBG MONITORING, ED
Glucose-Capillary: 172 mg/dL — ABNORMAL HIGH (ref 70–99)
Glucose-Capillary: 157 mg/dL — ABNORMAL HIGH (ref 70–99)

## 2023-06-30 NOTE — Discharge Instructions (Signed)
You were seen for your leg pain in the emergency department. It is likely post operative pain  At home, please continue taking the pain medications you were prescribed.    Check your MyChart online for the results of any tests that had not resulted by the time you left the emergency department.   Follow-up with your primary doctor in 2-3 days regarding your visit.  Follow-up with your surgeon in 1-2 weeks.   Return immediately to the emergency department if you experience any of the following: worsening pain, chest pain, shortness of breath, or any other concerning symptoms.    Thank you for visiting our Emergency Department. It was a pleasure taking care of you today.

## 2023-06-30 NOTE — Progress Notes (Signed)
Patient can go to [name], room #101A. Patient will be transported via [facility] - RN to call when ready. The number to call for report is [phone].  Edwin Dada, MSW, LCSW Transitions of Care  Clinical Social Worker II [phone]

## 2023-06-30 NOTE — ED Notes (Signed)
I called [facility] ETA 4 HOURS

## 2023-06-30 NOTE — ED Notes (Signed)
[facility] called for transport back to [facility]

## 2023-06-30 NOTE — ED Notes (Signed)
DC to [facility]

## 2023-06-30 NOTE — ED Notes (Addendum)
[facility] called for report at number noted by SW. Unable to reach staff to give report at this time. Number left with staff to receive callback

## 2023-06-30 NOTE — ED Provider Notes (Signed)
Emergency Medicine Observation Re-evaluation Note  Rhonda Hickman is a 67 y.o. female, seen on rounds today.  Pt initially presented to the ED for complaints of Knee Injury Currently, the patient is not having any complaints.  Physical Exam  BP 115/69   Pulse 75   Temp 97.8 F (36.6 C)   Resp 16   Ht 6' (1.829 m)   Wt 117.9 kg   SpO2 99%   BMI 35.26 kg/m  Physical Exam General: Resting comfortably in stretcher Lungs: Normal work of breathing Psych: Calm  ED Course / MDM  EKG:EKG Interpretation Date/Time:  Wednesday June 28 2023 17:16:00 EST Ventricular Rate:  76 PR Interval:  151 QRS Duration:  87 QT Interval:  387 QTC Calculation: 436 R Axis:   6  Text Interpretation: Sinus rhythm Borderline T wave abnormalities Confirmed by Vivi Barrack [id number]) on 06/28/2023 5:26:27 PM  I have reviewed the labs performed to date as well as medications administered while in observation.  Recent changes in the last 24 hours include lower extremity ultrasound ordered and no evidence of DVT.  Seen by physical therapy and Occupational Therapy who feel the patient needs acute care and is not safe to go home at this time.  Social work has been looking into placing a Mining engineer.  Plan  Current plan is for placement.    Rondel Baton, MD 06/30/23 [phone]

## 2023-06-30 NOTE — ED Notes (Addendum)
Wheeled pt to the RR and back to bed. I changed pt sheet and pt is now resting.

## 2023-07-03 ENCOUNTER — Encounter: Payer: 59 | Admitting: Physician Assistant

## 2023-07-13 DIAGNOSIS — M79641 Pain in right hand: Secondary | ICD-10-CM | POA: Diagnosis not present

## 2023-07-13 DIAGNOSIS — M19041 Primary osteoarthritis, right hand: Secondary | ICD-10-CM | POA: Diagnosis not present

## 2023-07-13 DIAGNOSIS — S60221A Contusion of right hand, initial encounter: Secondary | ICD-10-CM | POA: Diagnosis not present

## 2023-07-13 DIAGNOSIS — S6991XA Unspecified injury of right wrist, hand and finger(s), initial encounter: Secondary | ICD-10-CM | POA: Diagnosis not present

## 2023-07-13 DIAGNOSIS — M1811 Unilateral primary osteoarthritis of first carpometacarpal joint, right hand: Secondary | ICD-10-CM | POA: Diagnosis not present
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
## 2023-07-14 ENCOUNTER — Other Ambulatory Visit (INDEPENDENT_AMBULATORY_CARE_PROVIDER_SITE_OTHER): Payer: Self-pay | Admitting: Primary Care

## 2023-07-14 DIAGNOSIS — Z760 Encounter for issue of repeat prescription: Secondary | ICD-10-CM

## 2023-07-14 DIAGNOSIS — N3942 Incontinence without sensory awareness: Secondary | ICD-10-CM

## 2023-07-14 NOTE — Telephone Encounter (Signed)
 Medication Refill -  Most Recent Primary Care Visit:  Provider: CELESTIA ROSALINE SQUIBB  Department: [facility]  Visit Type: OFFICE VISIT  Date: 05/29/2023  Medication: oxybutynin  (DITROPAN ) 5 MG tablet   Has the patient contacted their pharmacy? yes (Agent: If yes, when and what did the pharmacy advise?)pharmacy called directly in  Is this the correct pharmacy for this prescription? yes  This is the patient's preferred pharmacy:   [facility] [place] [address] [place] [address] Phone: [phone] Fax: [phone]   Has the prescription been filled recently? no  Is the patient out of the medication? No has a week worth left  Has the patient been seen for an appointment in the last year OR does the patient have an upcoming appointment? yes  Can we respond through MyChart? yes  Agent: Please be advised that Rx refills may take up to 3 business days. We ask that you follow-up with your pharmacy.

## 2023-07-17 ENCOUNTER — Telehealth (INDEPENDENT_AMBULATORY_CARE_PROVIDER_SITE_OTHER): Payer: Self-pay

## 2023-07-17 NOTE — Telephone Encounter (Signed)
 Requested Prescriptions  Refused Prescriptions Disp Refills   oxybutynin  (DITROPAN ) 5 MG tablet 180 tablet 5    Sig: TAKE 1 TABLET BY MOUTH 2 TO 3  TIMES DAILY FOR BLADDER SPASMS     Urology:  Bladder Agents Passed - 07/17/2023  5:59 PM      Passed - Valid encounter within last 12 months    Recent Outpatient Visits           1 month ago Type 2 diabetes mellitus with hyperosmolarity without coma, with long-term current use of insulin  (HCC)   [place] [facility] Celestia Rosaline SQUIBB, NP   5 months ago Type 2 diabetes mellitus with hyperosmolarity without coma, with long-term current use of insulin  (HCC)   [place] [facility] Celestia Rosaline SQUIBB, NP   8 months ago Type 2 diabetes mellitus with hyperosmolarity without coma, with long-term current use of insulin  [facility])   [place] [facility] Celestia Rosaline SQUIBB, NP   11 months ago Acute pain of left shoulder   [place] [facility] Celestia Rosaline SQUIBB, NP   1 year ago Type 2 diabetes mellitus with hyperosmolarity without coma, with long-term current use of insulin  [facility])   [place] [facility] Celestia Rosaline SQUIBB, NP       Future Appointments             In 1 week Gretta Bertrum ORN, PA-C [facility]

## 2023-07-17 NOTE — Telephone Encounter (Signed)
 Copied from CRM [id number]. Topic: General - Other >> Jul 17, 2023  4:01 PM Phill Myron wrote: Neysa Bonito with [facility] reporting that she will be opening pt Rhonda Hickman's case up on July 18, 2023

## 2023-07-17 NOTE — Telephone Encounter (Signed)
 Noted.

## 2023-07-19 ENCOUNTER — Other Ambulatory Visit (INDEPENDENT_AMBULATORY_CARE_PROVIDER_SITE_OTHER): Payer: Self-pay | Admitting: Primary Care

## 2023-07-19 DIAGNOSIS — Z794 Long term (current) use of insulin: Secondary | ICD-10-CM

## 2023-07-20 ENCOUNTER — Telehealth: Payer: Self-pay | Admitting: Orthopaedic Surgery

## 2023-07-20 DIAGNOSIS — N182 Chronic kidney disease, stage 2 (mild): Secondary | ICD-10-CM | POA: Diagnosis not present

## 2023-07-20 DIAGNOSIS — Z7984 Long term (current) use of oral hypoglycemic drugs: Secondary | ICD-10-CM | POA: Diagnosis not present

## 2023-07-20 DIAGNOSIS — Z96652 Presence of left artificial knee joint: Secondary | ICD-10-CM | POA: Diagnosis not present

## 2023-07-20 DIAGNOSIS — I129 Hypertensive chronic kidney disease with stage 1 through stage 4 chronic kidney disease, or unspecified chronic kidney disease: Secondary | ICD-10-CM | POA: Diagnosis not present

## 2023-07-20 DIAGNOSIS — Z7901 Long term (current) use of anticoagulants: Secondary | ICD-10-CM | POA: Diagnosis not present

## 2023-07-20 DIAGNOSIS — E1169 Type 2 diabetes mellitus with other specified complication: Secondary | ICD-10-CM | POA: Diagnosis not present

## 2023-07-20 DIAGNOSIS — I482 Chronic atrial fibrillation, unspecified: Secondary | ICD-10-CM | POA: Diagnosis not present

## 2023-07-20 DIAGNOSIS — E1122 Type 2 diabetes mellitus with diabetic chronic kidney disease: Secondary | ICD-10-CM | POA: Diagnosis not present

## 2023-07-20 DIAGNOSIS — Z471 Aftercare following joint replacement surgery: Secondary | ICD-10-CM | POA: Diagnosis not present

## 2023-07-20 DIAGNOSIS — Z794 Long term (current) use of insulin: Secondary | ICD-10-CM | POA: Diagnosis not present

## 2023-07-20 NOTE — Telephone Encounter (Signed)
 Gretchen called. She is the PT working with patient in home. She would like verbal orders for 2x wk 4wks and 1x wk 4wks. [phone] is her number.

## 2023-07-20 NOTE — Telephone Encounter (Signed)
 Verbal order given

## 2023-07-21 ENCOUNTER — Telehealth (INDEPENDENT_AMBULATORY_CARE_PROVIDER_SITE_OTHER): Payer: Self-pay | Admitting: Primary Care

## 2023-07-21 DIAGNOSIS — G8929 Other chronic pain: Secondary | ICD-10-CM | POA: Diagnosis not present

## 2023-07-21 DIAGNOSIS — M25561 Pain in right knee: Secondary | ICD-10-CM | POA: Diagnosis not present

## 2023-07-21 DIAGNOSIS — Z79899 Other long term (current) drug therapy: Secondary | ICD-10-CM | POA: Diagnosis not present

## 2023-07-21 DIAGNOSIS — M25562 Pain in left knee: Secondary | ICD-10-CM | POA: Diagnosis not present

## 2023-07-21 DIAGNOSIS — Z79891 Long term (current) use of opiate analgesic: Secondary | ICD-10-CM | POA: Diagnosis not present

## 2023-07-21 DIAGNOSIS — M5416 Radiculopathy, lumbar region: Secondary | ICD-10-CM | POA: Diagnosis not present

## 2023-07-21 DIAGNOSIS — F17210 Nicotine dependence, cigarettes, uncomplicated: Secondary | ICD-10-CM | POA: Diagnosis not present

## 2023-07-21 NOTE — Telephone Encounter (Signed)
 [name] OT from [facility] st[facility] patient called and cancelled her visit for today due to another MD appt

## 2023-07-21 NOTE — Telephone Encounter (Signed)
 Noted.

## 2023-07-24 ENCOUNTER — Encounter: Payer: Self-pay | Admitting: Physician Assistant

## 2023-07-24 ENCOUNTER — Ambulatory Visit (INDEPENDENT_AMBULATORY_CARE_PROVIDER_SITE_OTHER): Payer: 59 | Admitting: Physician Assistant

## 2023-07-24 ENCOUNTER — Other Ambulatory Visit: Payer: Self-pay

## 2023-07-24 DIAGNOSIS — Z96652 Presence of left artificial knee joint: Secondary | ICD-10-CM

## 2023-07-24 NOTE — Progress Notes (Signed)
 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  She has been at the skilled nursing facility.  She is now at home receiving home PT.  She states that she has 8.5 out of 10 pain in her knee.  Denies any fevers chills.  She has been taking hydrocodone  for pain which she receives through her pain clinic.  This is her first visit since surgery.  She presents in a motorized wheelchair.  She states she does walk with a walker.  Review of systems see HPI otherwise negative  Physical exam: General Well-developed well-nourished female no acute distress mood affect appropriate. Left: Knee full extension flexion to 100 degrees.  Surgical incision well approximated with staples no signs of dehiscence or wound infection.  Left calf supple nontender.  Dorsiflexion plantarflexion left ankle intact.  Impression: Status post left total knee arthroplasty  Plan: This point time we will transition her to outpatient therapy at [facility] for range of motion strengthening the knee.  Staples were harvested.  Surgical incision was well-healed and therefore no Steri-Strips were applied.  She will follow-up with us  in 1 month sooner if there is any questions concerns.

## 2023-07-26 ENCOUNTER — Telehealth (INDEPENDENT_AMBULATORY_CARE_PROVIDER_SITE_OTHER): Payer: Self-pay | Admitting: Primary Care

## 2023-07-26 ENCOUNTER — Other Ambulatory Visit: Payer: Self-pay | Admitting: Pharmacist

## 2023-07-26 MED ORDER — OZEMPIC (0.25 OR 0.5 MG/DOSE) 2 MG/3ML ~~LOC~~ SOPN: 0.5000 mg | PEN_INJECTOR | SUBCUTANEOUS | 1 refills | Status: DC

## 2023-07-26 NOTE — Telephone Encounter (Signed)
 Pt wants to know why her semiglutide

## 2023-07-26 NOTE — Telephone Encounter (Signed)
 Pt wants to know why her Semaglutide ,0.25 or 0.5MG /DOS, (OZEMPIC , 0.25 OR 0.5 MG/DOSE,) 2 MG/1.5ML SOPN  was denied. Looked in the notes and spoke with nurse but was unclear. Please advise patient.

## 2023-07-26 NOTE — Telephone Encounter (Signed)
 Will send to provider. Please refill if appropriate  Listed below is dispense history:  Dispensed Days Supply Quantity Provider Pharmacy  OZEMPIC   2 MG/3ML SOPN 03/10/2023 84 9 mL Marius Siemens, NP [facility]  OZEMPIC   2 MG/3ML SOPN 12/04/2022 84 9 mL Marius Siemens, NP [facility] ...  OZEMPIC   2 MG/3ML SOPN 09/17/2022 84 9 mL Marius Siemens, NP [facility]

## 2023-07-27 ENCOUNTER — Telehealth: Payer: Self-pay | Admitting: Primary Care

## 2023-07-27 NOTE — Telephone Encounter (Signed)
Thank you Franky Macho  Noted

## 2023-07-27 NOTE — Telephone Encounter (Signed)
Copied from CRM [id number]. Topic: General - Other >> Jul 27, 2023  1:35 PM Santiya F wrote: Reason for CRM: Pt is calling in requesting to speak with [facility]

## 2023-07-28 ENCOUNTER — Other Ambulatory Visit: Payer: Self-pay | Admitting: Pharmacist

## 2023-07-28 MED ORDER — OZEMPIC (0.25 OR 0.5 MG/DOSE) 2 MG/3ML ~~LOC~~ SOPN: 0.5000 mg | PEN_INJECTOR | SUBCUTANEOUS | 1 refills | Status: DC

## 2023-07-28 NOTE — Telephone Encounter (Signed)
Call placed to patient. Confirmed I was speaking with the patient using two identifiers. I introduced myself and the reason for my call. Discussed that she is only supposed to be on Januvia OR Ozempic, not both. Pt prefers to stay on Ozempic after discussion. I resent the Ozempic rxn to her preferred pharmacy. All questions and concerns addressed.   Butch Penny, PharmD, Patsy [place], CPP Clinical Pharmacist [facility] [phone]

## 2023-07-30 DIAGNOSIS — M25512 Pain in left shoulder: Secondary | ICD-10-CM | POA: Diagnosis not present

## 2023-07-30 DIAGNOSIS — J449 Chronic obstructive pulmonary disease, unspecified: Secondary | ICD-10-CM | POA: Diagnosis not present

## 2023-07-30 DIAGNOSIS — I679 Cerebrovascular disease, unspecified: Secondary | ICD-10-CM | POA: Diagnosis not present

## 2023-07-31 ENCOUNTER — Ambulatory Visit (HOSPITAL_COMMUNITY): Payer: 59

## 2023-08-02 ENCOUNTER — Other Ambulatory Visit (INDEPENDENT_AMBULATORY_CARE_PROVIDER_SITE_OTHER): Payer: Self-pay

## 2023-08-02 ENCOUNTER — Other Ambulatory Visit (INDEPENDENT_AMBULATORY_CARE_PROVIDER_SITE_OTHER): Payer: Self-pay | Admitting: Primary Care

## 2023-08-02 DIAGNOSIS — Z760 Encounter for issue of repeat prescription: Secondary | ICD-10-CM

## 2023-08-02 DIAGNOSIS — Z1211 Encounter for screening for malignant neoplasm of colon: Secondary | ICD-10-CM

## 2023-08-02 DIAGNOSIS — N3942 Incontinence without sensory awareness: Secondary | ICD-10-CM

## 2023-08-02 MED ORDER — OXYBUTYNIN CHLORIDE 5 MG PO TABS: ORAL_TABLET | ORAL | 1 refills | Status: DC

## 2023-08-02 NOTE — Telephone Encounter (Signed)
Most Recent Primary Care Visit:  Provider: Grayce Sessions  Department: [facility]  Visit Type: OFFICE VISIT  Date: 05/29/2023  Medication: oxybutynin (DITROPAN) 5 MG tablet Pharmacy requesting refill on behalf of patient.   Has the patient contacted their pharmacy? Yes  Is this the correct pharmacy for this prescription? Yes This is the patient's preferred pharmacy:   [facility] - [place], [place] - [address] [place] [address] Phone: [phone] Fax: [phone]  Has the prescription been filled recently? No  Is the patient out of the medication? Yes  Has the patient been seen for an appointment in the last year OR does the patient have an upcoming appointment? Yes  Can we respond through MyChart? No  Agent: Please be advised that Rx refills may take up to 3 business days. We ask that you follow-up with your pharmacy.

## 2023-08-02 NOTE — Telephone Encounter (Signed)
Change in pharmacy. Requested Prescriptions  Pending Prescriptions Disp Refills   oxybutynin (DITROPAN) 5 MG tablet 180 tablet 5    Sig: TAKE 1 TABLET BY MOUTH 2 TO 3  TIMES DAILY FOR BLADDER SPASMS     Urology:  Bladder Agents Passed - 08/02/2023 11:04 AM      Passed - Valid encounter within last 12 months    Recent Outpatient Visits           2 months ago Type 2 diabetes mellitus with hyperosmolarity without coma, with long-term current use of insulin (HCC)   [place] [facility] Grayce Sessions, NP   5 months ago Type 2 diabetes mellitus with hyperosmolarity without coma, with long-term current use of insulin (HCC)   [place] [facility] Grayce Sessions, NP   8 months ago Type 2 diabetes mellitus with hyperosmolarity without coma, with long-term current use of insulin (HCC)   [place] [facility] Grayce Sessions, NP   11 months ago Acute pain of left shoulder   [place] [facility] Grayce Sessions, NP   1 year ago Type 2 diabetes mellitus with hyperosmolarity without coma, with long-term current use of insulin [facility])   [place] [facility] Grayce Sessions, NP

## 2023-08-02 NOTE — Telephone Encounter (Signed)
 Copied from CRM [id number]. Topic: Appointments - Appointment Scheduling >> Aug 02, 2023 10:35 AM Phill Myron wrote: Cala Bradford with [facility]  scheduled an appt.  Cala Bradford the case manager also wanted to add: patient needs a mammogram, bone density, cologard and a referral to a grief counselor.

## 2023-08-02 NOTE — Telephone Encounter (Signed)
I have already ordered cologuard and there is an order for mammogram and bone density already in chart. Please place referral for grief counselor

## 2023-08-03 ENCOUNTER — Ambulatory Visit (HOSPITAL_COMMUNITY): Payer: 59

## 2023-08-03 ENCOUNTER — Encounter (HOSPITAL_COMMUNITY): Payer: Self-pay

## 2023-08-03 NOTE — Therapy (Incomplete)
OUTPATIENT PHYSICAL THERAPY LOWER EXTREMITY EVALUATION   Patient Name: Rhonda Hickman MRN: [id number] DOB:1956-06-07, 68 y.o., female Today's Date: 08/03/2023  END OF SESSION:   Past Medical History:  Diagnosis Date   Anemia    Anxiety    Arthritis    Asthma    Atrial fibrillation (HCC)    Bipolar 1 disorder (HCC)    Bulging lumbar disc    Chronic pain of left knee    COPD (chronic obstructive pulmonary disease) (HCC)    CVA (cerebral vascular accident) (HCC)    Diabetes mellitus    GERD (gastroesophageal reflux disease)    Neuropathy    TIA (transient ischemic attack)    Vertigo    Past Surgical History:  Procedure Laterality Date   ABDOMINAL HYSTERECTOMY     CARPAL TUNNEL RELEASE Right    CHOLECYSTECTOMY     KNEE ARTHROSCOPY WITH LATERAL MENISECTOMY Left 06/08/2021   Procedure: KNEE ARTHROSCOPY WITH LATERAL MENISCECTOMY AND MEDIAL MENISCECTOMY;  Surgeon: Vickki Hearing, MD;  Location: [facility];  Service: Orthopedics;  Laterality: Left;   KNEE ARTHROSCOPY WITH MEDIAL MENISECTOMY Right 04/14/2016   Procedure: KNEE ARTHROSCOPY WITH MEDIAL AND LATERAL MENISECTOMY, MICROFRACTURE REPAIR;  Surgeon: Vickki Hearing, MD;  Location: [facility];  Service: Orthopedics;  Laterality: Right;  lateral menisectomy - needs crutch training   TOTAL KNEE ARTHROPLASTY Left 06/20/2023   Procedure: LEFT TOTAL KNEE ARTHROPLASTY;  Surgeon: Kathryne Hitch, MD;  Location: [facility];  Service: Orthopedics;  Laterality: Left;   Patient Active Problem List   Diagnosis Date Noted   OA (osteoarthritis) of knee 06/20/2023   Status post total left knee replacement 06/20/2023   Unilateral primary osteoarthritis, left knee 03/20/2023   Lumbar radiculopathy 02/01/2023   S/P left knee arthroscopy11/29/22 07/13/2021   Tear of meniscus of knee joint    Acute lower UTI 05/23/2021   A-fib (HCC) 05/22/2021   Leukocytosis 05/22/2021   Fall at home, initial encounter 05/22/2021    Class: Acute   HLD  (hyperlipidemia) 05/22/2021   Hypertension 03/18/2020   Bipolar 1 disorder (HCC)    Chronic atrial fibrillation (HCC) 12/03/2019   TIA (transient ischemic attack) 12/03/2019   Diabetic peripheral neuropathy (HCC) 12/03/2019   Chronic migraine 12/03/2019   Vertigo 08/03/2019   Bronchiectasis with acute exacerbation (HCC)    Obesity, Class III, BMI 40-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) (HCC)    Gastroesophageal reflux disease    Asthma exacerbation 06/06/2018   Gait abnormality 08/29/2017   Paresthesia 08/29/2017   Derangement of posterior horn of medial meniscus of right knee    Meniscus, lateral, derangement, right    Unilateral primary osteoarthritis, right knee    COPD with acute exacerbation (HCC) 03/09/2016   Type 2 diabetes mellitus (HCC) 02/16/2007   Cocaine abuse (HCC) 02/16/2007   Extrinsic asthma 02/16/2007   HOMELESSNESS, HX OF 02/16/2007    PCP: Grayce Sessions, NP  REFERRING PROVIDER: Kirtland Bouchard, PA-C  REFERRING DIAG: [phone] (ICD-10-CM) - Hx of total knee arthroplasty, left   THERAPY DIAG:  No diagnosis found.  Rationale for Evaluation and Treatment: Rehabilitation  ONSET DATE: ***  SUBJECTIVE:   SUBJECTIVE STATEMENT: ***  PERTINENT HISTORY: *** PAIN:  Are you having pain? Yes: NPRS scale: *** Pain location: *** Pain description: *** Aggravating factors: *** Relieving factors: ***  PRECAUTIONS: {Therapy precautions:24002}  RED FLAGS: {PT Red Flags:29287}   WEIGHT BEARING RESTRICTIONS: {Yes ***/No:24003}  FALLS:  Has patient fallen in last 6 months? {fallsyesno:27318}  PATIENT GOALS: ***  NEXT MD VISIT: ***  OBJECTIVE:  Note: Objective measures were completed at Evaluation unless otherwise noted.  DIAGNOSTIC FINDINGS: ***  PATIENT SURVEYS:  {rehab surveys:24030}  COGNITION: Overall cognitive status: {cognition:24006}     SENSATION: {sensation:27233}  EDEMA:  {edema:24020}  MUSCLE LENGTH: Hamstrings: Right *** deg; Left ***  deg Maisie Fus test: Right *** deg; Left *** deg  POSTURE: {posture:25561}  PALPATION: ***  LOWER EXTREMITY ROM:  Active ROM Right eval Left eval  Hip flexion    Hip extension    Hip abduction    Hip adduction    Hip internal rotation    Hip external rotation    Knee flexion    Knee extension    Ankle dorsiflexion    Ankle plantarflexion    Ankle inversion    Ankle eversion     (Blank rows = not tested)  LOWER EXTREMITY MMT:  MMT Right eval Left eval  Hip flexion    Hip extension    Hip abduction    Hip adduction    Hip internal rotation    Hip external rotation    Knee flexion    Knee extension    Ankle dorsiflexion    Ankle plantarflexion    Ankle inversion    Ankle eversion     (Blank rows = not tested)  LOWER EXTREMITY SPECIAL TESTS:  {LEspecialtests:26242}  FUNCTIONAL TESTS:  30 seconds chair stand test:*** 2 minute walk test: ***  GAIT: Distance walked: *** Assistive device utilized: {Assistive devices:23999} Level of assistance: {Levels of assistance:24026} Comments: ***                                                                                                                                TREATMENT DATE: ***    PATIENT EDUCATION:  Education details: PT Evaluation, findings, prognosis, frequency, attendance policy, and HEP if given.  Person educated: Patient Education method: Explanation Education comprehension: verbalized understanding  HOME EXERCISE PROGRAM: ***  ASSESSMENT:  CLINICAL IMPRESSION: Patient is a *** y.o. *** who was seen today for physical therapy evaluation and treatment for Z96.652 (ICD-10-CM) - Hx of total knee arthroplasty, left. Pt demonstrating *** due to ***. Pt will benefit from skilled Physical Therapy services to address deficits/limitations in order to improve functional and QOL.    OBJECTIVE IMPAIRMENTS: {opptimpairments:25111}.   ACTIVITY LIMITATIONS: {activitylimitations:27494}  PARTICIPATION  LIMITATIONS: {participationrestrictions:25113}  PERSONAL FACTORS: {Personal factors:25162} are also affecting patient's functional outcome.   REHAB POTENTIAL: {rehabpotential:25112}  CLINICAL DECISION MAKING: {clinical decision making:25114}  EVALUATION COMPLEXITY: {Evaluation complexity:25115}   GOALS: Goals reviewed with patient? {yes/no:20286}  SHORT TERM GOALS: Target date: ***  Pt will be independent with HEP in order to demonstrate participation in Physical Therapy POC.  Baseline: Goal status: {GOALSTATUS:25110}  2.  Pt will ***/10 pain during mobility in order to demonstrate improved pain with functional activities.  Baseline:  Goal status: {GOALSTATUS:25110}  LONG TERM GOALS: Target date: ***  Pt will  improve 5x STS by *** in order to demonstrate improved functional strength to return to desired activities.  Baseline: *** Goal status: {GOALSTATUS:25110}  2.  Pt will improve 2 MWT by *** in order to demonstrate improved functional ambulatory capacity in community setting.  Baseline: *** Goal status: {GOALSTATUS:25110}  3.  Pt will improve FOTO score by *** in order to demonstrate improved pain with functional goals and outcomes. Baseline: *** Goal status: {GOALSTATUS:25110}  4.  Pt will improve *** by *** in order to improve *** during functional activities.. Baseline: *** Goal status: {GOALSTATUS:25110}  5.  Pt will improve *** by *** in order to improve *** during functional activities.. Baseline: *** Goal status: {GOALSTATUS:25110}   PLAN:  PT FREQUENCY: {rehab frequency:25116}  PT DURATION: {rehab duration:25117}  PLANNED INTERVENTIONS: {rehab planned interventions:25118::"97110-Therapeutic exercises","97530- Therapeutic [phone]- Neuromuscular re-education","97535- Self UKGU","54270- Manual therapy"}  PLAN FOR NEXT SESSION: ***   Nelida Meuse, PT 08/03/2023, 8:00 AM

## 2023-08-03 NOTE — Therapy (Signed)
[facility] Outpatient Rehabilitation at [facility] [address] [place], [place], [address] Phone: [phone]   Fax:  [phone]  Patient Details  Name: Rhonda Hickman MRN: [id number] Date of Birth: [birth year] Referring Provider:  No ref. provider found  Encounter Date: 08/03/2023  Pt arrived to clinic from [facility] 4 minutes late. Upon arrival and walking back to exam room pt reporting she believes she is coming down with something. Pt reports increased coughing/respiratory symptoms with achiness and muscle soreness. Evaluation was terminated for today's date. Pt was advised to monitor symptoms and return back Monday if symptom-free for > 24 hours. Pt understood. Pt was informed to call anytime there is sickness/illness in household of symptoms. Pt understood. PT Evaluation re-scheduled to 08/07/2023 @ 1300.  Nelida Meuse, PT 08/03/2023, 1:20 PM  [place] [facility] Outpatient Rehabilitation at [place] [facility] [address] [place], [place], [address] Phone: [phone]   Fax:  [phone]

## 2023-08-04 ENCOUNTER — Other Ambulatory Visit: Payer: Self-pay | Admitting: Orthopaedic Surgery

## 2023-08-06 DIAGNOSIS — M6281 Muscle weakness (generalized): Secondary | ICD-10-CM | POA: Diagnosis not present

## 2023-08-07 ENCOUNTER — Ambulatory Visit (HOSPITAL_COMMUNITY): Payer: 59

## 2023-08-07 NOTE — Therapy (Deleted)
 OUTPATIENT PHYSICAL THERAPY LOWER EXTREMITY EVALUATION   Patient Name: Rhonda Hickman MRN: [id number] DOB:1955/12/18, 68 y.o., female Today's Date: 08/07/2023  END OF SESSION:   Past Medical History:  Diagnosis Date   Anemia    Anxiety    Arthritis    Asthma    Atrial fibrillation (HCC)    Bipolar 1 disorder (HCC)    Bulging lumbar disc    Chronic pain of left knee    COPD (chronic obstructive pulmonary disease) (HCC)    CVA (cerebral vascular accident) (HCC)    Diabetes mellitus    GERD (gastroesophageal reflux disease)    Neuropathy    TIA (transient ischemic attack)    Vertigo    Past Surgical History:  Procedure Laterality Date   ABDOMINAL HYSTERECTOMY     CARPAL TUNNEL RELEASE Right    CHOLECYSTECTOMY     KNEE ARTHROSCOPY WITH LATERAL MENISECTOMY Left 06/08/2021   Procedure: KNEE ARTHROSCOPY WITH LATERAL MENISCECTOMY AND MEDIAL MENISCECTOMY;  Surgeon: Vickki Hearing, MD;  Location: [facility];  Service: Orthopedics;  Laterality: Left;   KNEE ARTHROSCOPY WITH MEDIAL MENISECTOMY Right 04/14/2016   Procedure: KNEE ARTHROSCOPY WITH MEDIAL AND LATERAL MENISECTOMY, MICROFRACTURE REPAIR;  Surgeon: Vickki Hearing, MD;  Location: [facility];  Service: Orthopedics;  Laterality: Right;  lateral menisectomy - needs crutch training   TOTAL KNEE ARTHROPLASTY Left 06/20/2023   Procedure: LEFT TOTAL KNEE ARTHROPLASTY;  Surgeon: Kathryne Hitch, MD;  Location: [facility];  Service: Orthopedics;  Laterality: Left;   Patient Active Problem List   Diagnosis Date Noted   OA (osteoarthritis) of knee 06/20/2023   Status post total left knee replacement 06/20/2023   Unilateral primary osteoarthritis, left knee 03/20/2023   Lumbar radiculopathy 02/01/2023   S/P left knee arthroscopy11/29/22 07/13/2021   Tear of meniscus of knee joint    Acute lower UTI 05/23/2021   A-fib (HCC) 05/22/2021   Leukocytosis 05/22/2021   Fall at home, initial encounter 05/22/2021    Class: Acute   HLD  (hyperlipidemia) 05/22/2021   Hypertension 03/18/2020   Bipolar 1 disorder (HCC)    Chronic atrial fibrillation (HCC) 12/03/2019   TIA (transient ischemic attack) 12/03/2019   Diabetic peripheral neuropathy (HCC) 12/03/2019   Chronic migraine 12/03/2019   Vertigo 08/03/2019   Bronchiectasis with acute exacerbation (HCC)    Obesity, Class III, BMI 40-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) (HCC)    Gastroesophageal reflux disease    Asthma exacerbation 06/06/2018   Gait abnormality 08/29/2017   Paresthesia 08/29/2017   Derangement of posterior horn of medial meniscus of right knee    Meniscus, lateral, derangement, right    Unilateral primary osteoarthritis, right knee    COPD with acute exacerbation (HCC) 03/09/2016   Type 2 diabetes mellitus (HCC) 02/16/2007   Cocaine abuse (HCC) 02/16/2007   Extrinsic asthma 02/16/2007   HOMELESSNESS, HX OF 02/16/2007    PCP: Grayce Sessions, NP  REFERRING PROVIDER: Kirtland Bouchard, PA-C  REFERRING DIAG: [phone] (ICD-10-CM) - Hx of total knee arthroplasty, left   THERAPY DIAG:  No diagnosis found.  Rationale for Evaluation and Treatment: Rehabilitation  ONSET DATE: ***  SUBJECTIVE:   SUBJECTIVE STATEMENT: ***  PERTINENT HISTORY: *** PAIN:  Are you having pain? Yes: NPRS scale: *** Pain location: *** Pain description: *** Aggravating factors: *** Relieving factors: ***  PRECAUTIONS: {Therapy precautions:24002}  RED FLAGS: {PT Red Flags:29287}   WEIGHT BEARING RESTRICTIONS: {Yes ***/No:24003}  FALLS:  Has patient fallen in last 6 months? {fallsyesno:27318}  PATIENT GOALS: ***  NEXT MD VISIT: ***  OBJECTIVE:  Note: Objective measures were completed at Evaluation unless otherwise noted.  DIAGNOSTIC FINDINGS: ***  PATIENT SURVEYS:  {rehab surveys:24030}  COGNITION: Overall cognitive status: {cognition:24006}     SENSATION: {sensation:27233}  EDEMA:  {edema:24020}  MUSCLE LENGTH: Hamstrings: Right *** deg; Left ***  deg Maisie Fus test: Right *** deg; Left *** deg  POSTURE: {posture:25561}  PALPATION: ***  LOWER EXTREMITY ROM:  Active ROM Right eval Left eval  Hip flexion    Hip extension    Hip abduction    Hip adduction    Hip internal rotation    Hip external rotation    Knee flexion    Knee extension    Ankle dorsiflexion    Ankle plantarflexion    Ankle inversion    Ankle eversion     (Blank rows = not tested)  LOWER EXTREMITY MMT:  MMT Right eval Left eval  Hip flexion    Hip extension    Hip abduction    Hip adduction    Hip internal rotation    Hip external rotation    Knee flexion    Knee extension    Ankle dorsiflexion    Ankle plantarflexion    Ankle inversion    Ankle eversion     (Blank rows = not tested)  LOWER EXTREMITY SPECIAL TESTS:  {LEspecialtests:26242}  FUNCTIONAL TESTS:  30 seconds chair stand test:*** 2 minute walk test: ***  GAIT: Distance walked: *** Assistive device utilized: {Assistive devices:23999} Level of assistance: {Levels of assistance:24026} Comments: ***                                                                                                                                TREATMENT DATE: ***    PATIENT EDUCATION:  Education details: PT Evaluation, findings, prognosis, frequency, attendance policy, and HEP if given.  Person educated: Patient Education method: Explanation Education comprehension: verbalized understanding  HOME EXERCISE PROGRAM: ***  ASSESSMENT:  CLINICAL IMPRESSION: Patient is a *** y.o. *** who was seen today for physical therapy evaluation and treatment for Z96.652 (ICD-10-CM) - Hx of total knee arthroplasty, left. Pt demonstrating *** due to ***. Pt will benefit from skilled Physical Therapy services to address deficits/limitations in order to improve functional and QOL.    OBJECTIVE IMPAIRMENTS: {opptimpairments:25111}.   ACTIVITY LIMITATIONS: {activitylimitations:27494}  PARTICIPATION  LIMITATIONS: {participationrestrictions:25113}  PERSONAL FACTORS: {Personal factors:25162} are also affecting patient's functional outcome.   REHAB POTENTIAL: {rehabpotential:25112}  CLINICAL DECISION MAKING: {clinical decision making:25114}  EVALUATION COMPLEXITY: {Evaluation complexity:25115}   GOALS: Goals reviewed with patient? {yes/no:20286}  SHORT TERM GOALS: Target date: ***  Pt will be independent with HEP in order to demonstrate participation in Physical Therapy POC.  Baseline: Goal status: {GOALSTATUS:25110}  2.  Pt will ***/10 pain during mobility in order to demonstrate improved pain with functional activities.  Baseline:  Goal status: {GOALSTATUS:25110}  LONG TERM GOALS: Target date: ***  Pt will  improve 5x STS by *** in order to demonstrate improved functional strength to return to desired activities.  Baseline: *** Goal status: {GOALSTATUS:25110}  2.  Pt will improve 2 MWT by *** in order to demonstrate improved functional ambulatory capacity in community setting.  Baseline: *** Goal status: {GOALSTATUS:25110}  3.  Pt will improve FOTO score by *** in order to demonstrate improved pain with functional goals and outcomes. Baseline: *** Goal status: {GOALSTATUS:25110}  4.  Pt will improve *** by *** in order to improve *** during functional activities.. Baseline: *** Goal status: {GOALSTATUS:25110}  5.  Pt will improve *** by *** in order to improve *** during functional activities.. Baseline: *** Goal status: {GOALSTATUS:25110}   PLAN:  PT FREQUENCY: {rehab frequency:25116}  PT DURATION: {rehab duration:25117}  PLANNED INTERVENTIONS: {rehab planned interventions:25118::"97110-Therapeutic exercises","97530- Therapeutic [phone]- Neuromuscular re-education","97535- Self JXBJ","47829- Manual therapy"}  PLAN FOR NEXT SESSION: ***   Nelida Meuse, PT 08/07/2023, 8:08 AM

## 2023-08-09 ENCOUNTER — Inpatient Hospital Stay (INDEPENDENT_AMBULATORY_CARE_PROVIDER_SITE_OTHER): Payer: 59 | Admitting: Primary Care

## 2023-08-09 ENCOUNTER — Other Ambulatory Visit (INDEPENDENT_AMBULATORY_CARE_PROVIDER_SITE_OTHER): Payer: Self-pay | Admitting: Primary Care

## 2023-08-09 DIAGNOSIS — E1100 Type 2 diabetes mellitus with hyperosmolarity without nonketotic hyperglycemic-hyperosmolar coma (NKHHC): Secondary | ICD-10-CM

## 2023-08-09 DIAGNOSIS — Z760 Encounter for issue of repeat prescription: Secondary | ICD-10-CM

## 2023-08-09 DIAGNOSIS — Z720 Tobacco use: Secondary | ICD-10-CM

## 2023-08-09 MED ORDER — BUPROPION HCL ER (SR) 150 MG PO TB12: ORAL_TABLET | ORAL | 1 refills | Status: DC

## 2023-08-09 MED ORDER — METFORMIN HCL 1000 MG PO TABS: 1000.0000 mg | ORAL_TABLET | Freq: Two times a day (BID) | ORAL | 1 refills | Status: AC

## 2023-08-09 NOTE — Telephone Encounter (Signed)
Change in Pharmacy  Requested Prescriptions  Pending Prescriptions Disp Refills   Insulin Pen Needle (GNP ULTICARE PEN NEEDLES) 32G X 4 MM MISC [Pharmacy Med Name: Ulticare Pen Needle 32 gauge x 5/32"] 200 each 2    Sig: USE TO INJECT INSULIN UP TO 5 TIMES A DAY     Endocrinology: Diabetes - Testing Supplies Passed - 08/09/2023 12:41 PM      Passed - Valid encounter within last 12 months    Recent Outpatient Visits           2 months ago Type 2 diabetes mellitus with hyperosmolarity without coma, with long-term current use of insulin (HCC)   [place] [facility] Rhonda Sessions, NP   6 months ago Type 2 diabetes mellitus with hyperosmolarity without coma, with long-term current use of insulin (HCC)   [place] [facility] Rhonda Sessions, NP   9 months ago Type 2 diabetes mellitus with hyperosmolarity without coma, with long-term current use of insulin (HCC)   [place] [facility] Rhonda Sessions, NP   11 months ago Acute pain of left shoulder   [place] [facility] Rhonda Sessions, NP   1 year ago Type 2 diabetes mellitus with hyperosmolarity without coma, with long-term current use of insulin (HCC)    [facility] Rhonda Sessions, NP               metFORMIN (GLUCOPHAGE) 1000 MG tablet 180 tablet 1    Sig: Take 1 tablet (1,000 mg total) by mouth 2 (two) times daily. TAKE ONE TABLET BY MOUTH TWICE DAILY @ 9AM & 5PM WITH MEALS     Endocrinology:  Diabetes - Biguanides Failed - 08/09/2023 12:41 PM      Failed - Cr in normal range and within 360 days    Creatinine, Ser  Date Value Ref Range Status  06/28/2023 1.03 (H) 0.44 - 1.00 mg/dL Final   Creatinine, Urine  Date Value Ref Range Status  12/17/2019 128.15 mg/dL Final    Comment:    Performed at [facility], [address] [place], [place] [address]         Failed - eGFR in normal range and within 360  days    GFR calc Af Amer  Date Value Ref Range Status  03/17/2020 >60 >60 mL/min Final   GFR, Estimated  Date Value Ref Range Status  06/28/2023 60 (L) >60 mL/min Final    Comment:    (NOTE) Calculated using the CKD-EPI Creatinine Equation (2021)    eGFR  Date Value Ref Range Status  04/19/2023 67 >59 mL/min/1.73 Final         Failed - B12 Level in normal range and within 720 days    Vitamin B-12  Date Value Ref Range Status  06/02/2020 460 232 - 1,245 pg/mL Final         Passed - HBA1C is between 0 and 7.9 and within 180 days    HbA1c, POC (controlled diabetic range)  Date Value Ref Range Status  05/29/2023 6.8 0.0 - 7.0 % Final   Hgb A1c MFr Bld  Date Value Ref Range Status  06/20/2023 6.8 (H) 4.8 - 5.6 % Final    Comment:    (NOTE) Pre diabetes:          5.7%-6.4%  Diabetes:              >6.4%  Glycemic control for   <  7.0% adults with diabetes          Passed - Valid encounter within last 6 months    Recent Outpatient Visits           2 months ago Type 2 diabetes mellitus with hyperosmolarity without coma, with long-term current use of insulin (HCC)   [place] [facility] Rhonda Sessions, NP   6 months ago Type 2 diabetes mellitus with hyperosmolarity without coma, with long-term current use of insulin (HCC)   [place] [facility] Rhonda Sessions, NP   9 months ago Type 2 diabetes mellitus with hyperosmolarity without coma, with long-term current use of insulin (HCC)   [place] [facility] Rhonda Sessions, NP   11 months ago Acute pain of left shoulder   [place] [facility] Rhonda Sessions, NP   1 year ago Type 2 diabetes mellitus with hyperosmolarity without coma, with long-term current use of insulin (HCC)   [place] [facility] Rhonda Sessions, NP              Passed - CBC within normal limits and completed in the last 12 months     WBC  Date Value Ref Range Status  06/28/2023 11.3 (H) 4.0 - 10.5 K/uL Final   RBC  Date Value Ref Range Status  06/28/2023 2.87 (L) 3.87 - 5.11 MIL/uL Final   Hemoglobin  Date Value Ref Range Status  06/29/2023 7.9 (L) 12.0 - 15.0 g/dL Final  65/78/4696 29.5 11.1 - 15.9 g/dL Final   HCT  Date Value Ref Range Status  06/29/2023 25.5 (L) 36.0 - 46.0 % Final    Comment:    Performed at [facility], [address], [place], [place] [address]   Hematocrit  Date Value Ref Range Status  04/19/2023 40.2 34.0 - 46.6 % Final   MCHC  Date Value Ref Range Status  06/28/2023 32.7 30.0 - 36.0 g/dL Final   [facility]  Date Value Ref Range Status  06/28/2023 28.2 26.0 - 34.0 pg Final   MCV  Date Value Ref Range Status  06/28/2023 86.4 80.0 - 100.0 fL Final  04/19/2023 89 79 - 97 fL Final   No results found for: "PLTCOUNTKUC", "LABPLAT", "POCPLA" RDW  Date Value Ref Range Status  06/28/2023 14.6 11.5 - 15.5 % Final  04/19/2023 14.8 11.7 - 15.4 % Final          buPROPion (WELLBUTRIN SR) 150 MG 12 hr tablet 180 tablet 1    Sig: TAKE ONE TABLET BY MOUTH TWICE DAILY @ 9AM & 9PM     Psychiatry: Antidepressants - bupropion Failed - 08/09/2023 12:41 PM      Failed - Cr in normal range and within 360 days    Creatinine, Ser  Date Value Ref Range Status  06/28/2023 1.03 (H) 0.44 - 1.00 mg/dL Final   Creatinine, Urine  Date Value Ref Range Status  12/17/2019 128.15 mg/dL Final    Comment:    Performed at [facility], [address] [place], [place] [address]         Failed - AST in normal range and within 360 days    AST  Date Value Ref Range Status  06/28/2023 13 (L) 15 - 41 U/L Final         Passed - ALT in normal range and within 360 days    ALT  Date Value Ref Range Status  06/28/2023 15  0 - 44 U/L Final         Passed - Last BP in normal range    BP Readings from Last 1 Encounters:  06/30/23 115/69         Passed - Valid encounter within last 6 months     Recent Outpatient Visits           2 months ago Type 2 diabetes mellitus with hyperosmolarity without coma, with long-term current use of insulin (HCC)   [place] [facility] Rhonda Sessions, NP   6 months ago Type 2 diabetes mellitus with hyperosmolarity without coma, with long-term current use of insulin (HCC)   [place] [facility] Rhonda Sessions, NP   9 months ago Type 2 diabetes mellitus with hyperosmolarity without coma, with long-term current use of insulin (HCC)   [place] [facility] Rhonda Sessions, NP   11 months ago Acute pain of left shoulder   [place] [facility] Rhonda Sessions, NP   1 year ago Type 2 diabetes mellitus with hyperosmolarity without coma, with long-term current use of insulin [facility])   [place] [facility] Rhonda Sessions, NP

## 2023-08-09 NOTE — Telephone Encounter (Signed)
Copied from CRM [id number]. Topic: Clinical - Medication Refill >> Aug 09, 2023 10:47 AM Phill Myron wrote: Most Recent Primary Care Visit:  Provider: Grayce Sessions  Department: [facility]  Visit Type: OFFICE VISIT  Date: 05/29/2023  Medication: buPROPion (WELLBUTRIN SR) 150 MG 12 hr table metFORMIN (GLUCOPHAGE) 1000 MG tablet  Has the patient contacted their pharmacy? No (Agent: If no, request that the patient contact the pharmacy for the refill. If patient does not wish to contact the pharmacy document the reason why and proceed with request.) (Agent: If yes, when and what did the pharmacy advise?)  Is this the correct pharmacy for this prescription? Yes If no, delete pharmacy and type the correct one.  This is the patient's preferred pharmacy:  [facility] [place], [place] - [address] [place] [address] Phone: [phone] Fax: [phone]    Is the patient out of the medication? No  Has the patient been seen for an appointment in the last year OR does the patient have an upcoming appointment? Yes  Can we respond through MyChart? No  Agent: Please be advised that Rx refills may take up to 3 business days. We ask that you follow-up with your pharmacy.

## 2023-08-10 ENCOUNTER — Ambulatory Visit (HOSPITAL_COMMUNITY): Payer: 59

## 2023-08-14 ENCOUNTER — Ambulatory Visit (HOSPITAL_COMMUNITY): Payer: 59 | Attending: Physician Assistant

## 2023-08-14 ENCOUNTER — Other Ambulatory Visit: Payer: Self-pay

## 2023-08-14 ENCOUNTER — Encounter (HOSPITAL_COMMUNITY): Payer: Self-pay

## 2023-08-14 DIAGNOSIS — Z96652 Presence of left artificial knee joint: Secondary | ICD-10-CM | POA: Diagnosis not present

## 2023-08-14 DIAGNOSIS — M6281 Muscle weakness (generalized): Secondary | ICD-10-CM | POA: Diagnosis not present

## 2023-08-14 DIAGNOSIS — Z7409 Other reduced mobility: Secondary | ICD-10-CM | POA: Insufficient documentation

## 2023-08-14 NOTE — Therapy (Signed)
OUTPATIENT PHYSICAL THERAPY LOWER EXTREMITY EVALUATION   Patient Name: Rhonda Hickman MRN: [id number] DOB:1956-04-05, 68 y.o., female Today's Date: 08/14/2023  END OF SESSION:  PT End of Session - 08/14/23 1432     Visit Number 1    Date for PT Re-Evaluation 10/09/23    Authorization Type U[employer] Dual complete    Authorization Time Period no auth; no limit    Progress Note Due on Visit 10    PT Start Time 1301    PT Stop Time 1340    PT Time Calculation (min) 39 min    Activity Tolerance Patient tolerated treatment well    Behavior During Therapy WFL for tasks assessed/performed             Past Medical History:  Diagnosis Date   Anemia    Anxiety    Arthritis    Asthma    Atrial fibrillation (HCC)    Bipolar 1 disorder (HCC)    Bulging lumbar disc    Chronic pain of left knee    COPD (chronic obstructive pulmonary disease) (HCC)    CVA (cerebral vascular accident) (HCC)    Diabetes mellitus    GERD (gastroesophageal reflux disease)    Neuropathy    TIA (transient ischemic attack)    Vertigo    Past Surgical History:  Procedure Laterality Date   ABDOMINAL HYSTERECTOMY     CARPAL TUNNEL RELEASE Right    CHOLECYSTECTOMY     KNEE ARTHROSCOPY WITH LATERAL MENISECTOMY Left 06/08/2021   Procedure: KNEE ARTHROSCOPY WITH LATERAL MENISCECTOMY AND MEDIAL MENISCECTOMY;  Surgeon: Vickki Hearing, MD;  Location: [facility];  Service: Orthopedics;  Laterality: Left;   KNEE ARTHROSCOPY WITH MEDIAL MENISECTOMY Right 04/14/2016   Procedure: KNEE ARTHROSCOPY WITH MEDIAL AND LATERAL MENISECTOMY, MICROFRACTURE REPAIR;  Surgeon: Vickki Hearing, MD;  Location: [facility];  Service: Orthopedics;  Laterality: Right;  lateral menisectomy - needs crutch training   TOTAL KNEE ARTHROPLASTY Left 06/20/2023   Procedure: LEFT TOTAL KNEE ARTHROPLASTY;  Surgeon: Kathryne Hitch, MD;  Location: [facility];  Service: Orthopedics;  Laterality: Left;   Patient Active Problem List   Diagnosis Date  Noted   OA (osteoarthritis) of knee 06/20/2023   Status post total left knee replacement 06/20/2023   Unilateral primary osteoarthritis, left knee 03/20/2023   Lumbar radiculopathy 02/01/2023   S/P left knee arthroscopy11/29/22 07/13/2021   Tear of meniscus of knee joint    Acute lower UTI 05/23/2021   A-fib (HCC) 05/22/2021   Leukocytosis 05/22/2021   Fall at home, initial encounter 05/22/2021    Class: Acute   HLD (hyperlipidemia) 05/22/2021   Hypertension 03/18/2020   Bipolar 1 disorder (HCC)    Chronic atrial fibrillation (HCC) 12/03/2019   TIA (transient ischemic attack) 12/03/2019   Diabetic peripheral neuropathy (HCC) 12/03/2019   Chronic migraine 12/03/2019   Vertigo 08/03/2019   Bronchiectasis with acute exacerbation (HCC)    Obesity, Class III, BMI 40-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) (HCC)    Gastroesophageal reflux disease    Asthma exacerbation 06/06/2018   Gait abnormality 08/29/2017   Paresthesia 08/29/2017   Derangement of posterior horn of medial meniscus of right knee    Meniscus, lateral, derangement, right    Unilateral primary osteoarthritis, right knee    COPD with acute exacerbation (HCC) 03/09/2016   Type 2 diabetes mellitus (HCC) 02/16/2007   Cocaine abuse (HCC) 02/16/2007   Extrinsic asthma 02/16/2007   HOMELESSNESS, HX OF 02/16/2007    PCP: Grayce Sessions, NP  REFERRING  PROVIDER: Kirtland Bouchard, PA-C  REFERRING DIAG: [phone] (ICD-10-CM) - Hx of total knee arthroplasty, left   THERAPY DIAG:  S/P TKR (total knee replacement), left  Impaired functional mobility, balance, gait, and endurance  Muscle weakness (generalized)  Rationale for Evaluation and Treatment: Rehabilitation  ONSET DATE: 06/20/2023  SUBJECTIVE:   SUBJECTIVE STATEMENT: Pt with L TKA on 06/20/2023. Pt reports no pain. She went to rehab facility after surgery. Pt reports pain in medial aspect of L eft knee. Pt reports having lift chair and uses it intermittently at home.    PERTINENT HISTORY:  PAIN:  Are you having pain? Yes: NPRS scale: 7/10 Pain location: Left medial knee Pain description: constant Aggravating factors: pressure Relieving factors: change of position  PRECAUTIONS: None  RED FLAGS: None   WEIGHT BEARING RESTRICTIONS: No  FALLS:  Has patient fallen in last 6 months? Yes. Number of falls Pt does not know how many falls she had  PATIENT GOALS: "I want to walk without RW and don't want to use electric carts   OBJECTIVE:  Note: Objective measures were completed at Evaluation unless otherwise noted.  DIAGNOSTIC FINDINGS:   PATIENT SURVEYS:  LEFS 24/80= 30%  COGNITION: Overall cognitive status: Within functional limits for tasks assessed     SENSATION: WFL  POSTURE:     PALPATION: Warm, edemaous on left knee compared to R knee  LOWER EXTREMITY ROM:  Active ROM Right eval Left eval  Hip flexion    Hip extension    Hip abduction    Hip adduction    Hip internal rotation    Hip external rotation    Knee flexion    Knee extension -5-0 -20-0  Ankle dorsiflexion 120 96  Ankle plantarflexion    Ankle inversion    Ankle eversion     (Blank rows = not tested)  LOWER EXTREMITY MMT:  MMT Right eval Left eval  Hip flexion    Hip extension    Hip abduction    Hip adduction    Hip internal rotation    Hip external rotation    Knee flexion    Knee extension    Ankle dorsiflexion    Ankle plantarflexion    Ankle inversion    Ankle eversion     (Blank rows = not tested)   FUNCTIONAL TESTS:  30 Second Chair Test: 6x- below average for age requires bracing on RW.  TUG: 28 seconds * >14 seconds increased falls risk*  GAIT: Distance walked: 59ft Assistive device utilized: Environmental consultant - 2 wheeled Level of assistance: Modified independence Comments: bilateral knee valgus during stance with heavy reliance on RW.                                                                                                                                  TREATMENT DATE:  08/14/2023  PT Evaluation   Heel slides with green strap x 20 LAQ x 15  See HEP below   PATIENT EDUCATION:  Education details: PT Evaluation, findings, prognosis, frequency, attendance policy, and HEP if given.  Person educated: Patient Education method: Explanation Education comprehension: verbalized understanding  HOME EXERCISE PROGRAM: Access Code: [email or web address] URL: [email or web address] Date: 08/14/2023 Prepared by: Starling Manns  Exercises - Supine Quad Set  - 1 x daily - 7 x weekly - 3 sets - 10 reps - Supine Heel Slide with Strap  - 1 x daily - 7 x weekly - 3 sets - 10 reps - Long Sitting Quad Set with Towel Roll Under Heel  - 1 x daily - 7 x weekly - 3 sets - 10 reps - Seated Long Arc Quad  - 1 x daily - 7 x weekly - 3 sets - 10 reps - Standing Hip Abduction with Counter Support  - 1 x daily - 7 x weekly - 3 sets - 10 reps  ASSESSMENT:  CLINICAL IMPRESSION: Patient is a 67 y.o. female who was seen today for physical therapy evaluation and treatment for Z96.652 (ICD-10-CM) - Hx of total knee arthroplasty, left. Pt demonstrating significant limitations in items listed below due to muscle weakness, ROM deficits, pain, and poor posture and balance reactions in setting of Left TKA. Pt will benefit from skilled Physical Therapy services to address deficits/limitations in order to improve functional and QOL.    OBJECTIVE IMPAIRMENTS: Abnormal gait, decreased activity tolerance, decreased balance, decreased mobility, difficulty walking, decreased ROM, decreased strength, hypomobility, postural dysfunction, and pain.   ACTIVITY LIMITATIONS: carrying, lifting, bending, sitting, standing, squatting, stairs, transfers, toileting, and locomotion level  PARTICIPATION LIMITATIONS: driving, community activity, occupation, and yard work  PERSONAL FACTORS: Age and 3+ comorbidities: see above  are also affecting patient's functional  outcome.   REHAB POTENTIAL: Fair 8 weeks out from surgery and lacking significant ROM   CLINICAL DECISION MAKING: Evolving/moderate complexity  EVALUATION COMPLEXITY: Moderate   GOALS: Goals reviewed with patient? Yes  SHORT TERM GOALS: Target date: 09/11/2023  Pt will be independent with HEP in order to demonstrate participation in Physical Therapy POC.  Baseline: Goal status: INITIAL  2.  Pt will 5/10 pain during mobility in order to demonstrate improved pain with functional activities.  Baseline:  Goal status: INITIAL  LONG TERM GOALS: Target date: 10/09/2023  Pt will improve 30 Second Chair Stand Test by >3 in order to demonstrate improved functional strength to return to desired activities.  Baseline: See objective.  Goal status: INITIAL  2.  Pt will improve TUG by 5 seconds in order to demonstrate improved functional ambulatory capacity in community setting.  Baseline: See objective.  Goal status: INITIAL  3.  Pt will improve LEFS score by > 10 points in order to demonstrate improved pain with functional goals and outcomes. Baseline: See objective.  Goal status: INITIAL  4.  Pt will improve Left knee ROM by 10 degrees in order to improve safety and symmetry during functional activities.. Baseline: See objective.  Goal status: INITIAL  5.  Pt will improve Left MMT by >1/2 grade in order to improve participation during functional activities.. Baseline: See objective.  Goal status: INITIAL   PLAN:  PT FREQUENCY: 2x/week  PT DURATION: 8 weeks  PLANNED INTERVENTIONS: 97164- PT Re-evaluation, 97110-Therapeutic exercises, 97530- Therapeutic activity, 97112- Neuromuscular re-education, 97535- Self Care, 54098- Manual therapy, [id number]- Gait training, 97014- Electrical stimulation (unattended), [phone]- Electrical stimulation (manual), [id number]- Traction (mechanical), Patient/Family education, Balance training, Stair training, Joint mobilization, Joint manipulation, Spinal  manipulation, Spinal mobilization, Cryotherapy, and Moist  heat  PLAN FOR NEXT SESSION: Heavy ROM- TE, strengthening, elevated transfer training,    Elie Goody, DPT [facility] [place] [phone] office  Nelida Meuse, PT 08/14/2023, 2:33 PM

## 2023-08-15 ENCOUNTER — Telehealth (INDEPENDENT_AMBULATORY_CARE_PROVIDER_SITE_OTHER): Payer: Self-pay | Admitting: Primary Care

## 2023-08-15 ENCOUNTER — Telehealth: Payer: Self-pay | Admitting: Orthopaedic Surgery

## 2023-08-15 ENCOUNTER — Inpatient Hospital Stay (INDEPENDENT_AMBULATORY_CARE_PROVIDER_SITE_OTHER): Payer: 59 | Admitting: Primary Care

## 2023-08-15 NOTE — Telephone Encounter (Signed)
 Called pt to remind them about atp. Pt will be present

## 2023-08-15 NOTE — Telephone Encounter (Signed)
 Patient called advised she went to (PT) yesterday and was told her left knee feels warn to the touch. Patient said (PT) was concerned how warm her knee was. Patient said (PT) told her to call Dr. Vernetta yesterday but she could not get through. The number to contact patient is [phone]

## 2023-08-16 ENCOUNTER — Telehealth: Payer: Self-pay

## 2023-08-16 NOTE — Telephone Encounter (Signed)
 Copied from CRM [id number]. Topic: Clinical - Prescription Issue >> Aug 15, 2023  5:21 PM Elle L wrote: Reason for CRM: [place] with [facility] Phone: [phone] Fax: [phone] called for a prescription refill of oxybutynin  (DITROPAN ) 5 MG tablet. However, I see a CRM from 1/29 of the patient requesting all prescriptions be forwarded to [facility].   All patient medication has been sent to [facility]  as their request.

## 2023-08-16 NOTE — Telephone Encounter (Signed)
Patient aware of the below message from Dr. Blackman  

## 2023-08-17 ENCOUNTER — Encounter (HOSPITAL_COMMUNITY): Payer: 59 | Admitting: Physical Therapy

## 2023-08-18 DIAGNOSIS — G8929 Other chronic pain: Secondary | ICD-10-CM | POA: Diagnosis not present

## 2023-08-18 DIAGNOSIS — M5416 Radiculopathy, lumbar region: Secondary | ICD-10-CM | POA: Diagnosis not present

## 2023-08-18 DIAGNOSIS — M25562 Pain in left knee: Secondary | ICD-10-CM | POA: Diagnosis not present

## 2023-08-18 DIAGNOSIS — Z79899 Other long term (current) drug therapy: Secondary | ICD-10-CM | POA: Diagnosis not present

## 2023-08-18 DIAGNOSIS — M25561 Pain in right knee: Secondary | ICD-10-CM | POA: Diagnosis not present

## 2023-08-18 DIAGNOSIS — F17210 Nicotine dependence, cigarettes, uncomplicated: Secondary | ICD-10-CM | POA: Diagnosis not present

## 2023-08-18 DIAGNOSIS — Z79891 Long term (current) use of opiate analgesic: Secondary | ICD-10-CM | POA: Diagnosis not present

## 2023-08-21 ENCOUNTER — Telehealth (INDEPENDENT_AMBULATORY_CARE_PROVIDER_SITE_OTHER): Payer: Self-pay | Admitting: Primary Care

## 2023-08-21 ENCOUNTER — Encounter (HOSPITAL_COMMUNITY): Payer: 59

## 2023-08-21 NOTE — Telephone Encounter (Signed)
 Called pt to remind them about atp. Pt will be present

## 2023-08-22 ENCOUNTER — Inpatient Hospital Stay (INDEPENDENT_AMBULATORY_CARE_PROVIDER_SITE_OTHER): Payer: 59 | Admitting: Primary Care

## 2023-08-24 ENCOUNTER — Encounter (HOSPITAL_COMMUNITY): Payer: 59

## 2023-08-28 ENCOUNTER — Encounter (HOSPITAL_COMMUNITY): Payer: 59 | Admitting: Physical Therapy

## 2023-08-29 ENCOUNTER — Encounter (INDEPENDENT_AMBULATORY_CARE_PROVIDER_SITE_OTHER): Payer: Self-pay | Admitting: Primary Care

## 2023-08-29 ENCOUNTER — Ambulatory Visit (INDEPENDENT_AMBULATORY_CARE_PROVIDER_SITE_OTHER): Payer: 59 | Admitting: Primary Care

## 2023-08-29 VITALS — BP 122/74 | HR 84 | Resp 16 | Ht 72.0 in | Wt 255.0 lb

## 2023-08-29 DIAGNOSIS — I10 Essential (primary) hypertension: Secondary | ICD-10-CM | POA: Diagnosis not present

## 2023-08-29 DIAGNOSIS — R2681 Unsteadiness on feet: Secondary | ICD-10-CM | POA: Diagnosis not present

## 2023-08-29 NOTE — Progress Notes (Signed)
 Subjective:   Rhonda Hickman is a 68 y.o. female presents for hospital follow up explained that was done by ortho after her status post left total knee arthroplasty. States has PT they have taught her how to walk but not getting out the chair is the most difficult. Told her to let them know so she will be able to get up and down safely. Past Medical History:  Diagnosis Date   Anemia    Anxiety    Arthritis    Asthma    Atrial fibrillation (HCC)    Bipolar 1 disorder (HCC)    Bulging lumbar disc    Chronic pain of left knee    COPD (chronic obstructive pulmonary disease) (HCC)    CVA (cerebral vascular accident) (HCC)    Diabetes mellitus    GERD (gastroesophageal reflux disease)    Neuropathy    TIA (transient ischemic attack)    Vertigo      Allergies  Allergen Reactions   Aspirin Nausea Only and Other (See Comments)    Causes stomach pain    Current Outpatient Medications on File Prior to Visit  Medication Sig Dispense Refill   ACCU-CHEK GUIDE test strip CHECK BLOOD SUGAR UP TO FOUR TIMES DAILY AS DIRECTED 100 strip 11   amLODipine (NORVASC) 5 MG tablet TAKE 1 TABLET BY MOUTH DAILY 80 tablet 3   blood glucose meter kit and supplies Dispense based on patient and insurance preference. Use up to four times daily as directed. (FOR ICD-10 E10.9, E11.9). 1 each 0   buPROPion (WELLBUTRIN SR) 150 MG 12 hr tablet TAKE ONE TABLET BY MOUTH TWICE DAILY @ 9AM & 9PM 180 tablet 1   divalproex (DEPAKOTE ER) 500 MG 24 hr tablet Take 500-1,000 mg by mouth See admin instructions. Take 500 mg in the morning and 1000 mg at bedtime     FEROSUL 325 (65 Fe) MG tablet TAKE ONE TABLET BY MOUTH DAILY AT 9AM WITH BREAKFAST 90 tablet 11   gabapentin (NEURONTIN) 100 MG capsule TAKE 1 CAPSULE 3 TIMES A DAY 60 capsule 0   HYDROcodone-acetaminophen (NORCO) 10-325 MG tablet Take 1 tablet by mouth every 6 (six) hours as needed for moderate pain (pain score 4-6).     insulin lispro (HUMALOG) 100 UNIT/ML  KwikPen Inject 20 Units into the skin 2 (two) times daily. (Patient taking differently: Inject 0-12 Units into the skin 2 (two) times daily as needed (If blood sugar is over 150). Sliding scale 0-150= None 151-200=2 201-250=4 251-300=6 301-350=8 351-400=10 401=12 units and contact doctor) 15 mL 3   Insulin Pen Needle (GNP ULTICARE PEN NEEDLES) 32G X 4 MM MISC USE TO INJECT INSULIN UP TO 5 TIMES A DAY 200 each 2   Insulin Syringe-Needle U-100 (ADVOCATE INSULIN SYRINGE) 29G X 1/2" 0.3 ML MISC Use to inject insulin 5x daily. 200 each 6   LANTUS SOLOSTAR 100 UNIT/ML Solostar Pen INJECT SUBCUTANEOUSLY 20 UNITS  TWICE DAILY (Patient taking differently: Inject 24 Units into the skin 2 (two) times daily.) 45 mL 2   metFORMIN (GLUCOPHAGE) 1000 MG tablet Take 1 tablet (1,000 mg total) by mouth 2 (two) times daily. TAKE ONE TABLET BY MOUTH TWICE DAILY @ 9AM & 5PM WITH MEALS 180 tablet 1   methocarbamol (ROBAXIN) 500 MG tablet Take 1 tablet (500 mg total) by mouth every 6 (six) hours as needed for muscle spasms. 40 tablet 0   metoprolol tartrate (LOPRESSOR) 50 MG tablet TAKE ONE TABLET BY MOUTH TWICE DAILY @ 9AM &  5PM 180 tablet 3   oxybutynin (DITROPAN) 5 MG tablet TAKE 1 TABLET BY MOUTH 2 TO 3  TIMES DAILY FOR BLADDER SPASMS 180 tablet 1   oxyCODONE (OXY IR/ROXICODONE) 5 MG immediate release tablet Take 1-2 tablets (5-10 mg total) by mouth every 4 (four) hours as needed for moderate pain (pain score 4-6) (pain score 4-6). 30 tablet 0   rosuvastatin (CRESTOR) 10 MG tablet Take 1 tablet (10 mg total) by mouth daily. 90 tablet 2   Semaglutide,0.25 or 0.5MG /DOS, (OZEMPIC, 0.25 OR 0.5 MG/DOSE,) 2 MG/3ML SOPN Inject 0.5 mg into the skin once a week. 3 mL 1   SYMBICORT 160-4.5 MCG/ACT inhaler INHALE TWO PUFFS BY MOUTH INTO LUNGS TWICE DAILY (Patient taking differently: Inhale 2 puffs into the lungs 2 (two) times daily as needed (shortness of breath).) 10.2 g 11   XARELTO 20 MG TABS tablet TAKE ONE TABLET BY MOUTH  DAILY AT 5PM WITH SUPPER 90 tablet 11   No current facility-administered medications on file prior to visit.    Review of System: ROS Comprehensive ROS Pertinent positive and negative noted in HPI   Objective:  BP 122/74   Pulse 84   Resp 16   Ht 6' (1.829 m)   Wt 255 lb (115.7 kg)   SpO2 99%   BMI 34.58 kg/m   Filed Weights   08/29/23 1725  Weight: 255 lb (115.7 kg)    Physical Exam Vitals reviewed.  Constitutional:      Appearance: She is obese.  HENT:     Head: Normocephalic.     Right Ear: External ear normal.     Left Ear: External ear normal.     Nose: Nose normal.  Eyes:     Extraocular Movements: Extraocular movements intact.  Cardiovascular:     Rate and Rhythm: Normal rate and regular rhythm.  Pulmonary:     Effort: Pulmonary effort is normal.     Breath sounds: Normal breath sounds.  Abdominal:     General: Bowel sounds are normal. There is distension.     Palpations: Abdomen is soft.  Musculoskeletal:     Cervical back: Normal range of motion and neck supple.     Comments: Stiffness after sitting for a period of time  Skin:    General: Skin is warm and dry.  Neurological:     Mental Status: Mental status is at baseline.  Psychiatric:        Mood and Affect: Mood normal.        Behavior: Behavior normal.      Assessment:  Rhonda Hickman was seen today for hospitalization follow-up.  Diagnoses and all orders for this visit:  Gait instability status post left total knee arthroplast in PT   Primary hypertension Well controlled      This note has been created with Education officer, environmental. Any transcriptional errors are unintentional.   Return in about 3 months (around 11/26/2023) for DM/.  Grayce Sessions, NP 08/29/2023, 5:53 PM

## 2023-08-30 DIAGNOSIS — I679 Cerebrovascular disease, unspecified: Secondary | ICD-10-CM | POA: Diagnosis not present

## 2023-08-30 DIAGNOSIS — J449 Chronic obstructive pulmonary disease, unspecified: Secondary | ICD-10-CM | POA: Diagnosis not present

## 2023-08-30 DIAGNOSIS — M25512 Pain in left shoulder: Secondary | ICD-10-CM | POA: Diagnosis not present

## 2023-08-31 ENCOUNTER — Encounter (HOSPITAL_COMMUNITY): Payer: 59 | Admitting: Physical Therapy

## 2023-09-04 ENCOUNTER — Ambulatory Visit (HOSPITAL_COMMUNITY): Payer: 59

## 2023-09-04 ENCOUNTER — Encounter (HOSPITAL_COMMUNITY): Payer: Self-pay

## 2023-09-04 DIAGNOSIS — Z96652 Presence of left artificial knee joint: Secondary | ICD-10-CM

## 2023-09-04 DIAGNOSIS — Z7409 Other reduced mobility: Secondary | ICD-10-CM

## 2023-09-04 DIAGNOSIS — M6281 Muscle weakness (generalized): Secondary | ICD-10-CM

## 2023-09-04 NOTE — Therapy (Signed)
 OUTPATIENT PHYSICAL THERAPY LOWER EXTREMITY TREATMENT   Patient Name: Rhonda Hickman MRN: [id number] DOB:Feb 25, 1956, 68 y.o., female Today's Date: 09/04/2023  END OF SESSION:  PT End of Session - 09/04/23 1256     Visit Number 2    Number of Visits 16    Date for PT Re-Evaluation 10/09/23    Authorization Type [employer] Dual complete    Authorization Time Period no auth; no limit    Progress Note Due on Visit 10    PT Start Time 1300    PT Stop Time 1347    PT Time Calculation (min) 47 min    Activity Tolerance Patient tolerated treatment well    Behavior During Therapy WFL for tasks assessed/performed             Past Medical History:  Diagnosis Date   Anemia    Anxiety    Arthritis    Asthma    Atrial fibrillation (HCC)    Bipolar 1 disorder (HCC)    Bulging lumbar disc    Chronic pain of left knee    COPD (chronic obstructive pulmonary disease) (HCC)    CVA (cerebral vascular accident) (HCC)    Diabetes mellitus    GERD (gastroesophageal reflux disease)    Neuropathy    TIA (transient ischemic attack)    Vertigo    Past Surgical History:  Procedure Laterality Date   ABDOMINAL HYSTERECTOMY     CARPAL TUNNEL RELEASE Right    CHOLECYSTECTOMY     KNEE ARTHROSCOPY WITH LATERAL MENISECTOMY Left 06/08/2021   Procedure: KNEE ARTHROSCOPY WITH LATERAL MENISCECTOMY AND MEDIAL MENISCECTOMY;  Surgeon: Vickki Hearing, MD;  Location: [facility];  Service: Orthopedics;  Laterality: Left;   KNEE ARTHROSCOPY WITH MEDIAL MENISECTOMY Right 04/14/2016   Procedure: KNEE ARTHROSCOPY WITH MEDIAL AND LATERAL MENISECTOMY, MICROFRACTURE REPAIR;  Surgeon: Vickki Hearing, MD;  Location: [facility];  Service: Orthopedics;  Laterality: Right;  lateral menisectomy - needs crutch training   TOTAL KNEE ARTHROPLASTY Left 06/20/2023   Procedure: LEFT TOTAL KNEE ARTHROPLASTY;  Surgeon: Kathryne Hitch, MD;  Location: [facility];  Service: Orthopedics;  Laterality: Left;   Patient Active Problem  List   Diagnosis Date Noted   OA (osteoarthritis) of knee 06/20/2023   Status post total left knee replacement 06/20/2023   Unilateral primary osteoarthritis, left knee 03/20/2023   Lumbar radiculopathy 02/01/2023   S/P left knee arthroscopy11/29/22 07/13/2021   Tear of meniscus of knee joint    Acute lower UTI 05/23/2021   A-fib (HCC) 05/22/2021   Leukocytosis 05/22/2021   Fall at home, initial encounter 05/22/2021    Class: Acute   HLD (hyperlipidemia) 05/22/2021   Hypertension 03/18/2020   Bipolar 1 disorder (HCC)    Chronic atrial fibrillation (HCC) 12/03/2019   TIA (transient ischemic attack) 12/03/2019   Diabetic peripheral neuropathy (HCC) 12/03/2019   Chronic migraine 12/03/2019   Vertigo 08/03/2019   Bronchiectasis with acute exacerbation (HCC)    Obesity, Class III, BMI 40-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) (HCC)    Gastroesophageal reflux disease    Asthma exacerbation 06/06/2018   Gait abnormality 08/29/2017   Paresthesia 08/29/2017   Derangement of posterior horn of medial meniscus of right knee    Meniscus, lateral, derangement, right    Unilateral primary osteoarthritis, right knee    COPD with acute exacerbation (HCC) 03/09/2016   Type 2 diabetes mellitus (HCC) 02/16/2007   Cocaine abuse (HCC) 02/16/2007   Extrinsic asthma 02/16/2007   HOMELESSNESS, HX OF 02/16/2007  PCP: Grayce Sessions, NP  REFERRING PROVIDER: Kirtland Bouchard, PA-C  REFERRING DIAG: [phone] (ICD-10-CM) - Hx of total knee arthroplasty, left   THERAPY DIAG:  S/P TKR (total knee replacement), left  Impaired functional mobility, balance, gait, and endurance  Muscle weakness (generalized)  Rationale for Evaluation and Treatment: Rehabilitation  ONSET DATE: 06/20/2023  SUBJECTIVE:   SUBJECTIVE STATEMENT: 09/04/23:  Pt has been out of therapy due to the flu.  Reports she has completed her HEP while out sick.  Current pain scale 8/10 tender, sore and sensitive to the touch.   Reports/demonstrates increase ease with standing.  Stated she has been walking some around house wihtout AD, no reports of recent falls.    Eval:  Pt with L TKA on 06/20/2023. Pt reports no pain. She went to rehab facility after surgery. Pt reports pain in medial aspect of L eft knee. Pt reports having lift chair and uses it intermittently at home.   PERTINENT HISTORY:  PAIN:  Are you having pain? Yes: NPRS scale: 7/10 Pain location: Left medial knee Pain description: constant Aggravating factors: pressure Relieving factors: change of position  PRECAUTIONS: None  RED FLAGS: None   WEIGHT BEARING RESTRICTIONS: No  FALLS:  Has patient fallen in last 6 months? Yes. Number of falls Pt does not know how many falls she had  PATIENT GOALS: "I want to walk without RW and don't want to use electric carts   OBJECTIVE:  Note: Objective measures were completed at Evaluation unless otherwise noted.  DIAGNOSTIC FINDINGS:   PATIENT SURVEYS:  LEFS 24/80= 30%  COGNITION: Overall cognitive status: Within functional limits for tasks assessed     SENSATION: WFL  POSTURE:     PALPATION: Warm, edemaous on left knee compared to R knee  LOWER EXTREMITY ROM:  Active ROM Right eval Left eval Left 09/04/23:  Hip flexion     Hip extension     Hip abduction     Hip adduction     Hip internal rotation     Hip external rotation     Knee flexion     Knee extension -5-0 -20-0 18 from 0  Ankle dorsiflexion 120 96 112  Ankle plantarflexion     Ankle inversion     Ankle eversion      (Blank rows = not tested)  LOWER EXTREMITY MMT:  MMT Right eval Left eval  Hip flexion    Hip extension    Hip abduction    Hip adduction    Hip internal rotation    Hip external rotation    Knee flexion    Knee extension    Ankle dorsiflexion    Ankle plantarflexion    Ankle inversion    Ankle eversion     (Blank rows = not tested)   FUNCTIONAL TESTS:  30 Second Chair Test: 6x- below  average for age requires bracing on RW.  TUG: 28 seconds * >14 seconds increased falls risk*  GAIT: Distance walked: 73ft Assistive device utilized: Environmental consultant - 2 wheeled Level of assistance: Modified independence Comments: bilateral knee valgus during stance with heavy reliance on RW.  TREATMENT DATE: 09/04/23: Reviewed goals Educated importance of HEP compliance for maximal benefits Educated on self scar massage and desensitization techniques to improve tolerance with touching clothes, fabrics, etc.  Supine: Quad sets 10x 5" with tactile cueing SAQ 3x 10 reps 5" holds Heel slide 10x 5" holds end range AROM 18-112 Bridge 10x 5" SLR 10x  Sidelying:  Abduction 10x  Sitting: LAQ 10x STS 10x hands on thighs 10x  08/14/2023  PT Evaluation   Heel slides with green strap x 20 LAQ x 15  See HEP below   PATIENT EDUCATION:  Education details: PT Evaluation, findings, prognosis, frequency, attendance policy, and HEP if given.  Person educated: Patient Education method: Explanation Education comprehension: verbalized understanding  HOME EXERCISE PROGRAM: Access Code: [email or web address] URL: [email or web address] Date: 08/14/2023 Prepared by: Starling Manns  Exercises - Supine Quad Set  - 1 x daily - 7 x weekly - 3 sets - 10 reps - Supine Heel Slide with Strap  - 1 x daily - 7 x weekly - 3 sets - 10 reps - Long Sitting Quad Set with Towel Roll Under Heel  - 1 x daily - 7 x weekly - 3 sets - 10 reps - Seated Long Arc Quad  - 1 x daily - 7 x weekly - 3 sets - 10 reps - Standing Hip Abduction with Counter Support  - 1 x daily - 7 x weekly - 3 sets - 10 reps  09/04/23: Bridge  ASSESSMENT:  CLINICAL IMPRESSION: 09/04/23:  Reviewed goals and educated importance of HEP compliance for maximal benefits.  Session focus with knee mobility and proximal  strengthening.  Pt required multimodal cueing for quad neurore-ed  to improve distal quad activation with verbal and tactile cueing.  Improved with SAQ based exercises.  If pt continues to exhibit quad weakness, may benefit from russian estim to improve activation, not done this session as ability to complete with cueing and active SAQ movement.  AROM 18-112 degrees.  Added bridges to improve gluteal activation, added to HEP.  Pt able to complete sit to stand from 21in height with no pushing from chair.  Stated she has been ambulating without AD in home, encouraged to continue with RW for long duration or uneven terrain for safety with verbalized understanding.  Pt educated on self manual scar tissue massage to address adhesions on incision and discussed desensitization techniques to improve tolerance with fabric against incision.  Eval:  Patient is a 68 y.o. female who was seen today for physical therapy evaluation and treatment for Z96.652 (ICD-10-CM) - Hx of total knee arthroplasty, left. Pt demonstrating significant limitations in items listed below due to muscle weakness, ROM deficits, pain, and poor posture and balance reactions in setting of Left TKA. Pt will benefit from skilled Physical Therapy services to address deficits/limitations in order to improve functional and QOL.    OBJECTIVE IMPAIRMENTS: Abnormal gait, decreased activity tolerance, decreased balance, decreased mobility, difficulty walking, decreased ROM, decreased strength, hypomobility, postural dysfunction, and pain.   ACTIVITY LIMITATIONS: carrying, lifting, bending, sitting, standing, squatting, stairs, transfers, toileting, and locomotion level  PARTICIPATION LIMITATIONS: driving, community activity, occupation, and yard work  PERSONAL FACTORS: Age and 3+ comorbidities: see above  are also affecting patient's functional outcome.   REHAB POTENTIAL: Fair 8 weeks out from surgery and lacking significant ROM   CLINICAL DECISION  MAKING: Evolving/moderate complexity  EVALUATION COMPLEXITY: Moderate   GOALS: Goals reviewed with patient? Yes  SHORT TERM GOALS: Target date: 09/11/2023  Pt will be independent  with HEP in order to demonstrate participation in Physical Therapy POC.  Baseline: Goal status: INITIAL  2.  Pt will 5/10 pain during mobility in order to demonstrate improved pain with functional activities.  Baseline:  Goal status: INITIAL  LONG TERM GOALS: Target date: 10/09/2023  Pt will improve 30 Second Chair Stand Test by >3 in order to demonstrate improved functional strength to return to desired activities.  Baseline: See objective.  Goal status: INITIAL  2.  Pt will improve TUG by 5 seconds in order to demonstrate improved functional ambulatory capacity in community setting.  Baseline: See objective.  Goal status: INITIAL  3.  Pt will improve LEFS score by > 10 points in order to demonstrate improved pain with functional goals and outcomes. Baseline: See objective.  Goal status: INITIAL  4.  Pt will improve Left knee ROM by 10 degrees in order to improve safety and symmetry during functional activities.. Baseline: See objective.  Goal status: INITIAL  5.  Pt will improve Left MMT by >1/2 grade in order to improve participation during functional activities.. Baseline: See objective.  Goal status: INITIAL   PLAN:  PT FREQUENCY: 2x/week  PT DURATION: 8 weeks  PLANNED INTERVENTIONS: 97164- PT Re-evaluation, 97110-Therapeutic exercises, 97530- Therapeutic activity, 97112- Neuromuscular re-education, 97535- Self Care, 16109- Manual therapy, [phone]- Gait training, 97014- Electrical stimulation (unattended), [phone]- Electrical stimulation (manual), [id number]- Traction (mechanical), Patient/Family education, Balance training, Stair training, Joint mobilization, Joint manipulation, Spinal manipulation, Spinal mobilization, Cryotherapy, and Moist heat  PLAN FOR NEXT SESSION: Heavy ROM- TE,  strengthening, elevated transfer training,    Becky Sax, LPTA/CLT; CBIS [phone]  Juel Burrow, PTA 09/04/2023, 2:01 PM

## 2023-09-07 ENCOUNTER — Encounter (HOSPITAL_COMMUNITY): Payer: 59 | Admitting: Physical Therapy

## 2023-09-11 ENCOUNTER — Other Ambulatory Visit: Payer: Self-pay | Admitting: Physician Assistant

## 2023-09-15 DIAGNOSIS — M6281 Muscle weakness (generalized): Secondary | ICD-10-CM | POA: Diagnosis not present

## 2023-09-22 DIAGNOSIS — Z79899 Other long term (current) drug therapy: Secondary | ICD-10-CM | POA: Diagnosis not present

## 2023-09-22 DIAGNOSIS — Z79891 Long term (current) use of opiate analgesic: Secondary | ICD-10-CM | POA: Diagnosis not present

## 2023-09-22 DIAGNOSIS — M25562 Pain in left knee: Secondary | ICD-10-CM | POA: Diagnosis not present

## 2023-09-22 DIAGNOSIS — M5416 Radiculopathy, lumbar region: Secondary | ICD-10-CM | POA: Diagnosis not present

## 2023-09-22 DIAGNOSIS — F17210 Nicotine dependence, cigarettes, uncomplicated: Secondary | ICD-10-CM | POA: Diagnosis not present

## 2023-09-22 DIAGNOSIS — G8929 Other chronic pain: Secondary | ICD-10-CM | POA: Diagnosis not present

## 2023-09-22 DIAGNOSIS — M25561 Pain in right knee: Secondary | ICD-10-CM | POA: Diagnosis not present

## 2023-09-27 DIAGNOSIS — J449 Chronic obstructive pulmonary disease, unspecified: Secondary | ICD-10-CM | POA: Diagnosis not present

## 2023-09-27 DIAGNOSIS — M25512 Pain in left shoulder: Secondary | ICD-10-CM | POA: Diagnosis not present

## 2023-09-27 DIAGNOSIS — I679 Cerebrovascular disease, unspecified: Secondary | ICD-10-CM | POA: Diagnosis not present

## 2023-10-02 ENCOUNTER — Encounter (HOSPITAL_COMMUNITY): Payer: Self-pay | Admitting: Emergency Medicine

## 2023-10-02 ENCOUNTER — Emergency Department (HOSPITAL_COMMUNITY)

## 2023-10-02 ENCOUNTER — Emergency Department (HOSPITAL_COMMUNITY)
Admission: EM | Admit: 2023-10-02 | Discharge: 2023-10-02 | Disposition: A | Discharge status: Home / Self Care | Attending: Emergency Medicine | Admitting: Emergency Medicine

## 2023-10-02 ENCOUNTER — Other Ambulatory Visit: Payer: Self-pay

## 2023-10-02 DIAGNOSIS — W19XXXA Unspecified fall, initial encounter: Secondary | ICD-10-CM | POA: Diagnosis not present

## 2023-10-02 DIAGNOSIS — Z794 Long term (current) use of insulin: Secondary | ICD-10-CM | POA: Insufficient documentation

## 2023-10-02 DIAGNOSIS — E119 Type 2 diabetes mellitus without complications: Secondary | ICD-10-CM | POA: Diagnosis not present

## 2023-10-02 DIAGNOSIS — S20211A Contusion of right front wall of thorax, initial encounter: Secondary | ICD-10-CM | POA: Insufficient documentation

## 2023-10-02 DIAGNOSIS — R0781 Pleurodynia: Secondary | ICD-10-CM | POA: Diagnosis not present

## 2023-10-02 DIAGNOSIS — Z7984 Long term (current) use of oral hypoglycemic drugs: Secondary | ICD-10-CM | POA: Diagnosis not present

## 2023-10-02 DIAGNOSIS — R0789 Other chest pain: Secondary | ICD-10-CM | POA: Diagnosis present

## 2023-10-02 MED ORDER — HYDROCODONE-ACETAMINOPHEN 5-325 MG PO TABS
2.0000 | ORAL_TABLET | Freq: Once | ORAL | Status: AC
  Administered 2023-10-02: 2 via ORAL
  Filled 2023-10-02: qty 2

## 2023-10-02 NOTE — Discharge Instructions (Addendum)
 Continue taking your hydrocodone as previously prescribed as needed for pain.  Follow-up with primary doctor if not improving in the next week.

## 2023-10-02 NOTE — ED Provider Notes (Signed)
 [place] EMERGENCY DEPARTMENT AT [facility] Provider Note   CSN: [id number] Arrival date & time: 10/02/23  0051     History  Chief Complaint  Patient presents with   Marletta Lor    Rhonda Hickman is a 68 y.o. female.  Patient is a 68 year old female with past medical history of type 2 diabetes, hyperlipidemia, chronic atrial fibrillation, hyperlipidemia, bipolar disorder.  Patient presenting today for evaluation of right rib pain after a fall.  She was walking up an incline outdoors when she lost her balance and fell.  She reports landing on her right chest.  She complains of pain in her ribs just below her right breast.  She denies any difficulty breathing.  No fevers, no chills.  No abdominal pain.  She denies having struck her head.  No headache or neck pain.  The history is provided by the patient.       Home Medications Prior to Admission medications   Medication Sig Start Date End Date Taking? Authorizing Provider  ACCU-CHEK GUIDE test strip CHECK BLOOD SUGAR UP TO FOUR TIMES DAILY AS DIRECTED 12/02/22   Grayce Sessions, NP  amLODipine (NORVASC) 5 MG tablet TAKE 1 TABLET BY MOUTH DAILY 05/28/23   Grayce Sessions, NP  blood glucose meter kit and supplies Dispense based on patient and insurance preference. Use up to four times daily as directed. (FOR ICD-10 E10.9, E11.9). 08/03/21   Grayce Sessions, NP  buPROPion [facility] SR) 150 MG 12 hr tablet TAKE ONE TABLET BY MOUTH TWICE DAILY @ 9AM & 9PM 08/09/23   Grayce Sessions, NP  divalproex (DEPAKOTE ER) 500 MG 24 hr tablet Take 500-1,000 mg by mouth See admin instructions. Take 500 mg in the morning and 1000 mg at bedtime 05/19/21   [provider]  FEROSUL 325 (65 Fe) MG tablet TAKE ONE TABLET BY MOUTH DAILY AT 9AM WITH BREAKFAST 02/16/23   Grayce Sessions, NP  gabapentin (NEURONTIN) 100 MG capsule TAKE 1 CAPSULE 3 TIMES A DAY 09/12/23   Kirtland Bouchard, PA-C  HYDROcodone-acetaminophen (NORCO) 10-325  MG tablet Take 1 tablet by mouth every 6 (six) hours as needed for moderate pain (pain score 4-6).    [provider]  insulin lispro (HUMALOG) 100 UNIT/ML KwikPen Inject 20 Units into the skin 2 (two) times daily. Patient taking differently: Inject 0-12 Units into the skin 2 (two) times daily as needed (If blood sugar is over 150). Sliding scale 0-150= None 151-200=2 201-250=4 251-300=6 301-350=8 351-400=10 401=12 units and contact doctor 03/02/23   Grayce Sessions, NP  Insulin Pen Needle (GNP ULTICARE PEN NEEDLES) 32G X 4 MM MISC USE TO INJECT INSULIN UP TO 5 TIMES A DAY 08/09/23   Grayce Sessions, NP  Insulin Syringe-Needle U-100 (ADVOCATE INSULIN SYRINGE) 29G X 1/2" 0.3 ML MISC Use to inject insulin 5x daily. 09/07/20   Grayce Sessions, NP  LANTUS SOLOSTAR 100 UNIT/ML Solostar Pen INJECT SUBCUTANEOUSLY 20 UNITS  TWICE DAILY Patient taking differently: Inject 24 Units into the skin 2 (two) times daily. 02/20/23   Grayce Sessions, NP  metFORMIN (GLUCOPHAGE) 1000 MG tablet Take 1 tablet (1,000 mg total) by mouth 2 (two) times daily. TAKE ONE TABLET BY MOUTH TWICE DAILY @ 9AM & 5PM WITH MEALS 08/09/23   Grayce Sessions, NP  methocarbamol (ROBAXIN) 500 MG tablet Take 1 tablet (500 mg total) by mouth every 6 (six) hours as needed for muscle spasms. 06/22/23   Kathryne Hitch,  MD  metoprolol tartrate (LOPRESSOR) 50 MG tablet TAKE ONE TABLET BY MOUTH TWICE DAILY @ 9AM & 5PM 05/19/23   Little Ishikawa, MD  oxybutynin (DITROPAN) 5 MG tablet TAKE 1 TABLET BY MOUTH 2 TO 3  TIMES DAILY FOR BLADDER SPASMS 08/02/23   Grayce Sessions, NP  oxyCODONE (OXY IR/ROXICODONE) 5 MG immediate release tablet Take 1-2 tablets (5-10 mg total) by mouth every 4 (four) hours as needed for moderate pain (pain score 4-6) (pain score 4-6). 06/22/23   Kathryne Hitch, MD  rosuvastatin (CRESTOR) 10 MG tablet Take 1 tablet (10 mg total) by mouth daily. 04/21/23   Little Ishikawa, MD  Semaglutide,0.25 or 0.5MG /DOS, (OZEMPIC, 0.25 OR 0.5 MG/DOSE,) 2 MG/3ML SOPN Inject 0.5 mg into the skin once a week. 07/28/23   Hoy Register, MD  SYMBICORT 160-4.5 MCG/ACT inhaler INHALE TWO PUFFS BY MOUTH INTO LUNGS TWICE DAILY Patient taking differently: Inhale 2 puffs into the lungs 2 (two) times daily as needed (shortness of breath). 06/20/23   Grayce Sessions, NP  XARELTO 20 MG TABS tablet TAKE ONE TABLET BY MOUTH DAILY AT 5PM WITH SUPPER 03/10/23   Grayce Sessions, NP      Allergies    Aspirin    Review of Systems   Review of Systems  All other systems reviewed and are negative.   Physical Exam Updated Vital Signs BP (!) 155/76   Pulse 65   Temp 98 F (36.7 C)   Resp 20   Ht 6' (1.829 m)   Wt 115.7 kg   SpO2 100%   BMI 34.59 kg/m  Physical Exam Vitals and nursing note reviewed.  Constitutional:      General: She is not in acute distress.    Appearance: She is well-developed. She is not diaphoretic.  HENT:     Head: Normocephalic and atraumatic.  Cardiovascular:     Rate and Rhythm: Normal rate and regular rhythm.     Heart sounds: No murmur heard.    No friction rub. No gallop.  Pulmonary:     Effort: Pulmonary effort is normal. No respiratory distress.     Breath sounds: Normal breath sounds. No wheezing.     Comments: There is tenderness to palpation of the right ribs just below the right breast.  There is no crepitus and breath sounds are equal bilaterally. Abdominal:     General: Bowel sounds are normal. There is no distension.     Palpations: Abdomen is soft.     Tenderness: There is no abdominal tenderness.  Musculoskeletal:        General: Normal range of motion.     Cervical back: Normal range of motion and neck supple.  Skin:    General: Skin is warm and dry.  Neurological:     General: No focal deficit present.     Mental Status: She is alert and oriented to person, place, and time.     ED Results / Procedures /  Treatments   Labs (all labs ordered are listed, but only abnormal results are displayed) Labs Reviewed - No data to display  EKG None  Radiology No results found.  Procedures Procedures    Medications Ordered in ED Medications  HYDROcodone-acetaminophen (NORCO/VICODIN) 5-325 MG per tablet 2 tablet (has no administration in time range)    ED Course/ Medical Decision Making/ A&P  X-rays negative for fracture, pneumothorax, or pulmonary contusion.  Could possibly have an occult rib fracture.  This to be  treated as a chest wall contusion with hydrocodone, rest, and follow-up as needed.  Final Clinical Impression(s) / ED Diagnoses Final diagnoses:  None    Rx / DC Orders ED Discharge Orders     None         Geoffery Lyons, MD 10/02/23 [phone]

## 2023-10-02 NOTE — ED Triage Notes (Signed)
 Pt fell walking without a walker last night. She c/o right rib/breast pain.

## 2023-10-03 ENCOUNTER — Ambulatory Visit: Payer: Self-pay

## 2023-10-03 NOTE — Telephone Encounter (Signed)
  Chief Complaint: recent falls, dx with R chest contusion Symptoms: pain, difficulty coughing d/t increase in pain, "knot" in area Frequency: dx yesterday with contusion, believes she fell 2 days ago Pertinent Negatives: Patient denies difficulty breathing/SOB,  Disposition: [x] ED /[] Urgent Care (no appt availability in office) / [] Appointment(In office/virtual)/ []  [place] [facility]/ [] Home Care/ [] Refused Recommended Disposition /[] [place] [facility]/ []  Follow-up with PCP Additional Notes: Pt states that she was in ED this past weekend d/t multiple falls. Pt states that she was dx with contusion to the R chest. Pt states that she lives alone and she has been struggling to get into bed, walk around, she states she has to hold pressure to the area whenever she moves. Pt not able to cough appropriately d/t increase in pain. Pt states there is a "knot" under the breast on the R side, states this is new and that it was not there yesterday. Pt states d/t her baseline skin color she is unsure if she is bruised. Pt states that she was not discharged with any medication, pt states pain is worse than when she was at the ED. Pt advised to return to the ED. Pt states that she will when her sister is done with work, pt advised that she can  call 911.  Copied from CRM [id number]. Topic: Clinical - Medical Advice >> Oct 03, 2023  7:58 AM Everette C wrote: Reason for CRM: The patient shares that they fell this weekend and have been seen at the hospital but continue to experience significant discomfort on their right side  The patient has been told that they have a bruised rib and would like to speak with a member of clinical staff about their discomfort further when possible Reason for Disposition  SEVERE chest pain  Answer Assessment - Initial Assessment Questions 1. MECHANISM: "How did the injury happen?"     falls 2. ONSET: "When did the injury happen?" (Minutes or hours ago)     Last fall was 2  days ago 3. LOCATION: "Where on the chest is the injury located?"     R chest under breast tissue 4. APPEARANCE: "What does the injury look like?"     Unsure if bruised d/t normal skin tone, 5. BLEEDING: "Is there any bleeding now? If Yes, ask: How long has it been bleeding?"     denies 6. SEVERITY: "Any difficulty with breathing?"     Denies, "not really" 7. SIZE: For cuts, bruises, or swelling, ask: "How large is it?" (e.g., inches or centimeters)     Unsure if bruised d/t color of skin 8. PAIN: "Is there pain?" If Yes, ask: "How bad is the pain?"   (e.g., Scale 1-10; or mild, moderate, severe)     9.5  Protocols used: Chest Injury-A-AH

## 2023-10-04 ENCOUNTER — Encounter (HOSPITAL_COMMUNITY): Payer: Self-pay

## 2023-10-04 ENCOUNTER — Other Ambulatory Visit: Payer: Self-pay

## 2023-10-04 ENCOUNTER — Emergency Department (HOSPITAL_COMMUNITY)
Admission: EM | Admit: 2023-10-04 | Discharge: 2023-10-05 | Disposition: A | Discharge status: Home / Self Care | Attending: Emergency Medicine | Admitting: Emergency Medicine

## 2023-10-04 DIAGNOSIS — S20309A Unspecified superficial injuries of unspecified front wall of thorax, initial encounter: Secondary | ICD-10-CM | POA: Diagnosis present

## 2023-10-04 DIAGNOSIS — F17210 Nicotine dependence, cigarettes, uncomplicated: Secondary | ICD-10-CM | POA: Insufficient documentation

## 2023-10-04 DIAGNOSIS — R0781 Pleurodynia: Secondary | ICD-10-CM | POA: Insufficient documentation

## 2023-10-04 DIAGNOSIS — J45909 Unspecified asthma, uncomplicated: Secondary | ICD-10-CM | POA: Diagnosis not present

## 2023-10-04 DIAGNOSIS — W19XXXA Unspecified fall, initial encounter: Secondary | ICD-10-CM | POA: Insufficient documentation

## 2023-10-04 DIAGNOSIS — Z96652 Presence of left artificial knee joint: Secondary | ICD-10-CM | POA: Insufficient documentation

## 2023-10-04 DIAGNOSIS — I4891 Unspecified atrial fibrillation: Secondary | ICD-10-CM | POA: Diagnosis not present

## 2023-10-04 DIAGNOSIS — J449 Chronic obstructive pulmonary disease, unspecified: Secondary | ICD-10-CM | POA: Diagnosis not present

## 2023-10-04 DIAGNOSIS — E119 Type 2 diabetes mellitus without complications: Secondary | ICD-10-CM | POA: Diagnosis not present

## 2023-10-04 MED ORDER — METHOCARBAMOL 500 MG PO TABS
1000.0000 mg | ORAL_TABLET | Freq: Once | ORAL | Status: AC
  Administered 2023-10-05: 1000 mg via ORAL
  Filled 2023-10-04: qty 2

## 2023-10-04 MED ORDER — LIDOCAINE 5 % EX PTCH
1.0000 | MEDICATED_PATCH | CUTANEOUS | Status: DC
  Administered 2023-10-05: 1 via TRANSDERMAL
  Filled 2023-10-04: qty 1

## 2023-10-04 MED ORDER — LIDOCAINE 5 % EX PTCH
1.0000 | MEDICATED_PATCH | CUTANEOUS | 0 refills | Status: DC
Start: 2023-10-04 — End: 2024-05-22

## 2023-10-04 MED ORDER — METHOCARBAMOL 500 MG PO TABS: 500.0000 mg | ORAL_TABLET | Freq: Three times a day (TID) | ORAL | 0 refills | Status: DC | PRN

## 2023-10-04 NOTE — ED Triage Notes (Signed)
 Pt here from home with c/o right rib pain after fall on 3/24 -pt had evaluation of right rib pain in this ED on same day, pt reports she was walking up an incline outdoors when she lost her balance and fell 2 days ago. She reports landing on her right chest. She complains of pain in her ribs just below her right breast. Xray was negative. Pt says pain is still there and feels worse, so she came back.

## 2023-10-04 NOTE — Discharge Instructions (Signed)
 You were evaluated in the Emergency Department and after careful evaluation, we did not find any emergent condition requiring admission or further testing in the hospital.  Your exam/testing today is overall reassuring.  Symptoms likely due to continued pain from your bruised ribs.  Continue your home Norco medication.  For more significant pain can add on the Robaxin.  Also recommend daily use of the Lidoderm patches.  Take deep breaths throughout the day to help keep your lungs healthy.  Please return to the Emergency Department if you experience any worsening of your condition.   Thank you for allowing [place] to be a part of your care.

## 2023-10-04 NOTE — ED Provider Notes (Signed)
 [facility] Emergency Department Provider Note MRN:  [id number]  Arrival date & time: 10/04/23     Chief Complaint   Rib Injury   History of Present Illness   Rhonda Hickman is a 68 y.o. year-old female with a history of COPD, diabetes presenting to the ED with chief complaint of rib pain.  Stumbled and fell 2 days ago with trauma sustained to her right anterior ribs.  Having continued pain since that time.  Was seen here in the emergency department with reassuring x-rays, discharged home.  Continued pain not responding to home medications.  Denies shortness of breath, no abdominal pain, no fever, no cough.  Review of Systems  A thorough review of systems was obtained and all systems are negative except as noted in the HPI and PMH.   Patient's Health History    Past Medical History:  Diagnosis Date   Anemia    Anxiety    Arthritis    Asthma    Atrial fibrillation (HCC)    Bipolar 1 disorder (HCC)    Bulging lumbar disc    Chronic pain of left knee    COPD (chronic obstructive pulmonary disease) (HCC)    CVA (cerebral vascular accident) (HCC)    Diabetes mellitus    GERD (gastroesophageal reflux disease)    Neuropathy    TIA (transient ischemic attack)    Vertigo     Past Surgical History:  Procedure Laterality Date   ABDOMINAL HYSTERECTOMY     CARPAL TUNNEL RELEASE Right    CHOLECYSTECTOMY     KNEE ARTHROSCOPY WITH LATERAL MENISECTOMY Left 06/08/2021   Procedure: KNEE ARTHROSCOPY WITH LATERAL MENISCECTOMY AND MEDIAL MENISCECTOMY;  Surgeon: Vickki Hearing, MD;  Location: [facility];  Service: Orthopedics;  Laterality: Left;   KNEE ARTHROSCOPY WITH MEDIAL MENISECTOMY Right 04/14/2016   Procedure: KNEE ARTHROSCOPY WITH MEDIAL AND LATERAL MENISECTOMY, MICROFRACTURE REPAIR;  Surgeon: Vickki Hearing, MD;  Location: [facility];  Service: Orthopedics;  Laterality: Right;  lateral menisectomy - needs crutch training   TOTAL KNEE ARTHROPLASTY Left  06/20/2023   Procedure: LEFT TOTAL KNEE ARTHROPLASTY;  Surgeon: Kathryne Hitch, MD;  Location: [facility] OR;  Service: Orthopedics;  Laterality: Left;    Family History  Problem Relation Age of Onset   Diabetes Mother    Heart attack Father     Social History   Socioeconomic History   Marital status: Single    Spouse name: Not on file   Number of children: 0   Years of education: 12   Highest education level: High school graduate  Occupational History   Occupation: Unemployed  Tobacco Use   Smoking status: Some Days    Current packs/day: 0.50    Types: Cigarettes   Smokeless tobacco: Never   Tobacco comments:    tobacco info given  Vaping Use   Vaping status: Former  Substance and Sexual Activity   Alcohol use: No   Drug use: No   Sexual activity: Never  Other Topics Concern   Not on file  Social History Narrative   Lives at home alone.   Right-handed.   No caffeine use.   Social Drivers of Corporate investment banker Strain: Not on file  Food Insecurity: Not on file  Transportation Needs: Not on file  Physical Activity: Not on file  Stress: Not on file  Social Connections: Not on file  Intimate Partner Violence: Not on file     Physical Exam   Vitals:  10/04/23 2238  BP: (!) 156/111  Pulse: 75  Resp: 18  Temp: 98.4 F (36.9 C)  SpO2: 100%    CONSTITUTIONAL: Well-appearing, NAD NEURO/PSYCH:  Alert and oriented x 3, no focal deficits EYES:  eyes equal and reactive ENT/NECK:  no LAD, no JVD CARDIO: Regular rate, well-perfused, normal S1 and S2 PULM:  CTAB no wheezing or rhonchi GI/GU:  non-distended, non-tender MSK/SPINE:  No gross deformities, no edema SKIN:  no rash, atraumatic   *Additional and/or pertinent findings included in MDM below  Diagnostic and Interventional Summary    EKG Interpretation Date/Time:    Ventricular Rate:    PR Interval:    QRS Duration:    QT Interval:    QTC Calculation:   R Axis:      Text  Interpretation:         Labs Reviewed - No data to display  No orders to display    Medications  methocarbamol (ROBAXIN) tablet 1,000 mg (has no administration in time range)  lidocaine (LIDODERM) 5 % 1 patch (has no administration in time range)     Procedures  /  Critical Care Procedures  ED Course and Medical Decision Making  Initial Impression and Ddx Continued rib pain from fall, reassuring evaluation a few days ago with normal x-ray.  Patient is in no acute distress with normal vital signs, no increased work of breathing, no fever, no cough.  Abdomen is soft and nontender.  Doubt significant missed injury.  Seems like she just needs improved pain regimen.  Past medical/surgical history that increases complexity of ED encounter: Diabetes  Interpretation of Diagnostics Laboratory and/or imaging options to aid in the diagnosis/care of the patient were considered.  After careful history and physical examination, it was determined that there was no indication for diagnostics at this time.  Patient Reassessment and Ultimate Disposition/Management     Patient is happy with plan for augmented pain regimen at home, return precautions provided.  Patient management required discussion with the following services or consulting groups:  None  Complexity of Problems Addressed Acute complicated illness or Injury  Additional Data Reviewed and Analyzed Further history obtained from: Prior ED visit notes and Prior labs/imaging results  Additional Factors Impacting ED Encounter Risk Prescriptions  Elmer Sow. Pilar Plate, MD [facility] [email or web address]  Final Clinical Impressions(s) / ED Diagnoses     ICD-10-CM   1. Rib pain  R07.81       ED Discharge Orders          Ordered    methocarbamol (ROBAXIN) 500 MG tablet  Every 8 hours PRN        10/04/23 2355    lidocaine (LIDODERM) 5 %  Every 24 hours        10/04/23 2355              Discharge Instructions Discussed with and Provided to Patient:    Discharge Instructions      You were evaluated in the Emergency Department and after careful evaluation, we did not find any emergent condition requiring admission or further testing in the hospital.  Your exam/testing today is overall reassuring.  Symptoms likely due to continued pain from your bruised ribs.  Continue your home Norco medication.  For more significant pain can add on the Robaxin.  Also recommend daily use of the Lidoderm patches.  Take deep breaths throughout the day to help keep your lungs healthy.  Please return to the Emergency  Department if you experience any worsening of your condition.   Thank you for allowing [place] to be a part of your care.      Sabas Sous, MD 10/05/23 [phone]

## 2023-10-05 DIAGNOSIS — R0781 Pleurodynia: Secondary | ICD-10-CM | POA: Diagnosis not present

## 2023-10-11 ENCOUNTER — Telehealth (INDEPENDENT_AMBULATORY_CARE_PROVIDER_SITE_OTHER): Payer: Self-pay | Admitting: Primary Care

## 2023-10-11 NOTE — Telephone Encounter (Signed)
 Spoke to pt about appt.. Will be present

## 2023-10-12 ENCOUNTER — Ambulatory Visit (INDEPENDENT_AMBULATORY_CARE_PROVIDER_SITE_OTHER): Admitting: Primary Care

## 2023-10-12 ENCOUNTER — Encounter (INDEPENDENT_AMBULATORY_CARE_PROVIDER_SITE_OTHER): Payer: Self-pay | Admitting: Primary Care

## 2023-10-12 VITALS — BP 117/71 | HR 71 | Temp 97.7°F | Resp 20 | Ht 72.0 in | Wt 254.0 lb

## 2023-10-12 DIAGNOSIS — E1100 Type 2 diabetes mellitus with hyperosmolarity without nonketotic hyperglycemic-hyperosmolar coma (NKHHC): Secondary | ICD-10-CM | POA: Diagnosis not present

## 2023-10-12 DIAGNOSIS — R296 Repeated falls: Secondary | ICD-10-CM

## 2023-10-12 DIAGNOSIS — Z09 Encounter for follow-up examination after completed treatment for conditions other than malignant neoplasm: Secondary | ICD-10-CM

## 2023-10-12 DIAGNOSIS — R269 Unspecified abnormalities of gait and mobility: Secondary | ICD-10-CM | POA: Diagnosis not present

## 2023-10-12 DIAGNOSIS — Z794 Long term (current) use of insulin: Secondary | ICD-10-CM

## 2023-10-12 DIAGNOSIS — D509 Iron deficiency anemia, unspecified: Secondary | ICD-10-CM

## 2023-10-12 DIAGNOSIS — E2839 Other primary ovarian failure: Secondary | ICD-10-CM

## 2023-10-12 NOTE — Progress Notes (Unsigned)
 HFU- fall C/o c/ontinued pain and out of lidocaine patches Pain 8/10- ribs

## 2023-10-12 NOTE — Telephone Encounter (Signed)
 Patient came in for OV today, 10/12/2023.     Copied from CRM [id number]. Topic: General - Other >> Oct 11, 2023  8:20 AM Rhonda Hickman wrote: Reason for CRM: The patient is requesting to speak to Vonna Kotyk at the office regarding her bruised ribs from 3/25. I attempted to transfer her per her request however, the office was not open at this time. The call disconnected. I attempted to call the patient back and left a voicemail. The patient's call back number is [phone].

## 2023-10-12 NOTE — Progress Notes (Unsigned)
 Subjective:  [name] is a 68 y.o. female presents for hospital follow up after several falls and has  Admit to the hospital on  10/04/23, Imaging  No acute displaced fractures are identified d/c on 10/05/23 with rib pain and bruised ribs.  Continue Norco medication and added Robaxin and Lidoderm patches.  Encouraged to take deep breaths throughout the day to keep lungs healthy.  Explained to patient if she continues to fall she may end up having to be in a facility for physical therapy.  Patient did remember after her knee surgery she was going to physical therapy and she will call them and start therapy again to improve gait, stability and weakness.  This was ordered by orthopedics if she has any problem returning to physical therapy she will have them to resend order and make them aware of falls due to weakness and needs. Past Medical History:  Diagnosis Date   Anemia    Anxiety    Arthritis    Asthma    Atrial fibrillation (HCC)    Bipolar 1 disorder (HCC)    Bulging lumbar disc    Chronic pain of left knee    COPD (chronic obstructive pulmonary disease) (HCC)    CVA (cerebral vascular accident) (HCC)    Diabetes mellitus    GERD (gastroesophageal reflux disease)    Neuropathy    TIA (transient ischemic attack)    Vertigo      Allergies  Allergen Reactions   Aspirin Nausea Only and Other (See Comments)    Causes stomach pain    Current Outpatient Medications on File Prior to Visit  Medication Sig Dispense Refill   ACCU-CHEK GUIDE test strip CHECK BLOOD SUGAR UP TO FOUR TIMES DAILY AS DIRECTED 100 strip 11   amLODipine (NORVASC) 5 MG tablet TAKE 1 TABLET BY MOUTH DAILY 80 tablet 3   blood glucose meter kit and supplies Dispense based on patient and insurance preference. Use up to four times daily as directed. (FOR ICD-10 E10.9, E11.9). 1 each 0   buPROPion (WELLBUTRIN SR) 150 MG 12 hr tablet TAKE ONE TABLET BY MOUTH TWICE DAILY @ 9AM & 9PM 180 tablet 1   divalproex  (DEPAKOTE ER) 500 MG 24 hr tablet Take 500-1,000 mg by mouth See admin instructions. Take 500 mg in the morning and 1000 mg at bedtime     FEROSUL 325 (65 Fe) MG tablet TAKE ONE TABLET BY MOUTH DAILY AT 9AM WITH BREAKFAST 90 tablet 11   gabapentin (NEURONTIN) 100 MG capsule TAKE 1 CAPSULE 3 TIMES A DAY 60 capsule 0   HYDROcodone-acetaminophen (NORCO) 10-325 MG tablet Take 1 tablet by mouth every 6 (six) hours as needed for moderate pain (pain score 4-6).     insulin lispro (HUMALOG) 100 UNIT/ML KwikPen Inject 20 Units into the skin 2 (two) times daily. (Patient taking differently: Inject 0-12 Units into the skin 2 (two) times daily as needed (If blood sugar is over 150). Sliding scale 0-150= None 151-200=2 201-250=4 251-300=6 301-350=8 351-400=10 401=12 units and contact doctor) 15 mL 3   Insulin Pen Needle (GNP ULTICARE PEN NEEDLES) 32G X 4 MM MISC USE TO INJECT INSULIN UP TO 5 TIMES A DAY 200 each 2   Insulin Syringe-Needle U-100 (ADVOCATE INSULIN SYRINGE) 29G X 1/2" 0.3 ML MISC Use to inject insulin 5x daily. 200 each 6   LANTUS SOLOSTAR 100 UNIT/ML Solostar Pen INJECT SUBCUTANEOUSLY 20 UNITS  TWICE DAILY (Patient taking differently: Inject 24 Units into the skin  2 (two) times daily.) 45 mL 2   lidocaine (LIDODERM) 5 % Place 1 patch onto the skin daily. Remove & Discard patch within 12 hours or as directed by MD 5 patch 0   metFORMIN (GLUCOPHAGE) 1000 MG tablet Take 1 tablet (1,000 mg total) by mouth 2 (two) times daily. TAKE ONE TABLET BY MOUTH TWICE DAILY @ 9AM & 5PM WITH MEALS 180 tablet 1   methocarbamol (ROBAXIN) 500 MG tablet Take 1 tablet (500 mg total) by mouth every 8 (eight) hours as needed for muscle spasms. 30 tablet 0   metoprolol tartrate (LOPRESSOR) 50 MG tablet TAKE ONE TABLET BY MOUTH TWICE DAILY @ 9AM & 5PM 180 tablet 3   oxybutynin (DITROPAN) 5 MG tablet TAKE 1 TABLET BY MOUTH 2 TO 3  TIMES DAILY FOR BLADDER SPASMS 180 tablet 1   rosuvastatin (CRESTOR) 10 MG tablet Take 1  tablet (10 mg total) by mouth daily. 90 tablet 2   Semaglutide,0.25 or 0.5MG /DOS, (OZEMPIC, 0.25 OR 0.5 MG/DOSE,) 2 MG/3ML SOPN Inject 0.5 mg into the skin once a week. (Patient not taking: Reported on 10/12/2023) 3 mL 1   SYMBICORT 160-4.5 MCG/ACT inhaler INHALE TWO PUFFS BY MOUTH INTO LUNGS TWICE DAILY (Patient taking differently: Inhale 2 puffs into the lungs 2 (two) times daily as needed (shortness of breath).) 10.2 g 11   XARELTO 20 MG TABS tablet TAKE ONE TABLET BY MOUTH DAILY AT 5PM WITH SUPPER 90 tablet 11   No current facility-administered medications on file prior to visit.    Review of System: ROS Comprehensive ROS Pertinent positive and negative noted in HPI   Objective:  BP 117/71 (BP Location: Left Arm, Patient Position: Sitting, Cuff Size: Large)   Pulse 71   Temp 97.7 F (36.5 C) (Oral)   Resp 20   Wt 254 lb (115.2 kg)   SpO2 100%   BMI 34.45 kg/m   Filed Weights   10/12/23 1013  Weight: 254 lb (115.2 kg)    Physical Exam   Assessment:    This note has been created with Education officer, environmental. Any transcriptional errors are unintentional.   No follow-ups on file.  Grayce Sessions, NP 10/12/2023, 10:29 AM

## 2023-10-13 ENCOUNTER — Encounter (INDEPENDENT_AMBULATORY_CARE_PROVIDER_SITE_OTHER): Payer: Self-pay | Admitting: Primary Care

## 2023-10-13 LAB — CBC WITH DIFFERENTIAL/PLATELET
Basophils Absolute: 0 10*3/uL (ref 0.0–0.2)
Basos: 0 %
EOS (ABSOLUTE): 0.1 10*3/uL (ref 0.0–0.4)
Eos: 1 %
Hematocrit: 36.8 % (ref 34.0–46.6)
Hemoglobin: 11.3 g/dL (ref 11.1–15.9)
Immature Grans (Abs): 0 10*3/uL (ref 0.0–0.1)
Immature Granulocytes: 0 %
Lymphocytes Absolute: 3.5 10*3/uL — ABNORMAL HIGH (ref 0.7–3.1)
Lymphs: 40 %
MCH: 24 pg — ABNORMAL LOW (ref 26.6–33.0)
MCHC: 30.7 g/dL — ABNORMAL LOW (ref 31.5–35.7)
MCV: 78 fL — ABNORMAL LOW (ref 79–97)
Monocytes Absolute: 0.6 10*3/uL (ref 0.1–0.9)
Monocytes: 7 %
Neutrophils Absolute: 4.7 10*3/uL (ref 1.4–7.0)
Neutrophils: 52 %
Platelets: 320 10*3/uL (ref 150–450)
RBC: 4.7 x10E6/uL (ref 3.77–5.28)
RDW: 17.2 % — ABNORMAL HIGH (ref 11.7–15.4)
WBC: 8.9 10*3/uL (ref 3.4–10.8)

## 2023-10-15 DIAGNOSIS — M6281 Muscle weakness (generalized): Secondary | ICD-10-CM | POA: Diagnosis not present

## 2023-10-20 ENCOUNTER — Ambulatory Visit (INDEPENDENT_AMBULATORY_CARE_PROVIDER_SITE_OTHER): Admitting: Primary Care

## 2023-10-20 DIAGNOSIS — Z79899 Other long term (current) drug therapy: Secondary | ICD-10-CM | POA: Diagnosis not present

## 2023-10-20 DIAGNOSIS — F17210 Nicotine dependence, cigarettes, uncomplicated: Secondary | ICD-10-CM | POA: Diagnosis not present

## 2023-10-20 DIAGNOSIS — G8929 Other chronic pain: Secondary | ICD-10-CM | POA: Diagnosis not present

## 2023-10-20 DIAGNOSIS — M25561 Pain in right knee: Secondary | ICD-10-CM | POA: Diagnosis not present

## 2023-10-20 DIAGNOSIS — Z79891 Long term (current) use of opiate analgesic: Secondary | ICD-10-CM | POA: Diagnosis not present

## 2023-10-20 DIAGNOSIS — M25562 Pain in left knee: Secondary | ICD-10-CM | POA: Diagnosis not present

## 2023-10-20 DIAGNOSIS — M5416 Radiculopathy, lumbar region: Secondary | ICD-10-CM | POA: Diagnosis not present

## 2023-10-24 ENCOUNTER — Telehealth (INDEPENDENT_AMBULATORY_CARE_PROVIDER_SITE_OTHER): Payer: Self-pay | Admitting: Primary Care

## 2023-10-24 NOTE — Telephone Encounter (Signed)
 Called pt to confirm appt. Pt will be present.

## 2023-10-25 ENCOUNTER — Encounter (INDEPENDENT_AMBULATORY_CARE_PROVIDER_SITE_OTHER): Payer: Self-pay | Admitting: Primary Care

## 2023-10-25 ENCOUNTER — Ambulatory Visit (INDEPENDENT_AMBULATORY_CARE_PROVIDER_SITE_OTHER): Admitting: Primary Care

## 2023-10-25 VITALS — BP 119/74 | HR 75 | Resp 16 | Wt 246.0 lb

## 2023-10-25 DIAGNOSIS — H9203 Otalgia, bilateral: Secondary | ICD-10-CM | POA: Diagnosis not present

## 2023-10-25 DIAGNOSIS — F3340 Major depressive disorder, recurrent, in remission, unspecified: Secondary | ICD-10-CM | POA: Diagnosis not present

## 2023-10-25 NOTE — Progress Notes (Unsigned)
 [facility]  Rhonda Hickman, is a 68 y.o. female  ZOX:[id number]  WJX:[id number]  DOB - 1956/02/27  Bilateral ear pain external and difficult to flex them down Without pain internal ear within normal limits  Subjective:   Rhonda Hickman is a 68 y.o. female here today for an acute visit. Patient actually needs a letter for emotional support animal after completion of physical therapy.    No problems updated.  Comprehensive ROS Pertinent positive and negative noted in HPI   Allergies  Allergen Reactions   Aspirin  Nausea Only and Other (See Comments)    Causes stomach pain    Past Medical History:  Diagnosis Date   Anemia    Anxiety    Arthritis    Asthma    Atrial fibrillation (HCC)    Bipolar 1 disorder (HCC)    Bulging lumbar disc    Chronic pain of left knee    COPD (chronic obstructive pulmonary disease) (HCC)    CVA (cerebral vascular accident) (HCC)    Diabetes mellitus    GERD (gastroesophageal reflux disease)    Neuropathy    TIA (transient ischemic attack)    Vertigo     Current Outpatient Medications on File Prior to Visit  Medication Sig Dispense Refill   ACCU-CHEK GUIDE test strip CHECK BLOOD SUGAR UP TO FOUR TIMES DAILY AS DIRECTED 100 strip 11   amLODipine  (NORVASC ) 5 MG tablet TAKE 1 TABLET BY MOUTH DAILY 80 tablet 3   blood glucose meter kit and supplies Dispense based on patient and insurance preference. Use up to four times daily as directed. (FOR ICD-10 E10.9, E11.9). 1 each 0   buPROPion  (WELLBUTRIN  SR) 150 MG 12 hr tablet TAKE ONE TABLET BY MOUTH TWICE DAILY @ 9AM & 9PM 180 tablet 1   divalproex  (DEPAKOTE  ER) 500 MG 24 hr tablet Take 500-1,000 mg by mouth See admin instructions. Take 500 mg in the morning and 1000 mg at bedtime     FEROSUL 325 (65 Fe) MG tablet TAKE ONE TABLET BY MOUTH DAILY AT 9AM WITH BREAKFAST 90 tablet 11   gabapentin  (NEURONTIN ) 100 MG capsule TAKE 1 CAPSULE 3 TIMES A DAY 60 capsule 0    HYDROcodone -acetaminophen  (NORCO) 10-325 MG tablet Take 1 tablet by mouth every 6 (six) hours as needed for moderate pain (pain score 4-6).     insulin  lispro (HUMALOG ) 100 UNIT/ML KwikPen Inject 20 Units into the skin 2 (two) times daily. (Patient taking differently: Inject 0-12 Units into the skin 2 (two) times daily as needed (If blood sugar is over 150). Sliding scale 0-150= None 151-200=2 201-250=4 251-300=6 301-350=8 351-400=10 401=12 units and contact doctor) 15 mL 3   Insulin  Pen Needle (GNP ULTICARE PEN NEEDLES) 32G X 4 MM MISC USE TO INJECT INSULIN  UP TO 5 TIMES A DAY 200 each 2   Insulin  Syringe-Needle U-100 (ADVOCATE INSULIN  SYRINGE) 29G X 1/2" 0.3 ML MISC Use to inject insulin  5x daily. 200 each 6   LANTUS  SOLOSTAR 100 UNIT/ML Solostar Pen INJECT SUBCUTANEOUSLY 20 UNITS  TWICE DAILY (Patient taking differently: Inject 24 Units into the skin 2 (two) times daily.) 45 mL 2   lidocaine  (LIDODERM ) 5 % Place 1 patch onto the skin daily. Remove & Discard patch within 12 hours or as directed by MD 5 patch 0   metFORMIN  (GLUCOPHAGE ) 1000 MG tablet Take 1 tablet (1,000 mg total) by mouth 2 (two) times daily. TAKE ONE TABLET BY MOUTH TWICE DAILY @ 9AM & 5PM WITH MEALS 180  tablet 1   methocarbamol  (ROBAXIN ) 500 MG tablet Take 1 tablet (500 mg total) by mouth every 8 (eight) hours as needed for muscle spasms. 30 tablet 0   metoprolol  tartrate (LOPRESSOR ) 50 MG tablet TAKE ONE TABLET BY MOUTH TWICE DAILY @ 9AM & 5PM 180 tablet 3   oxybutynin  (DITROPAN ) 5 MG tablet TAKE 1 TABLET BY MOUTH 2 TO 3  TIMES DAILY FOR BLADDER SPASMS 180 tablet 1   rosuvastatin  (CRESTOR ) 10 MG tablet Take 1 tablet (10 mg total) by mouth daily. 90 tablet 2   Semaglutide ,0.25 or 0.5MG /DOS, (OZEMPIC , 0.25 OR 0.5 MG/DOSE,) 2 MG/3ML SOPN Inject 0.5 mg into the skin once a week. (Patient not taking: Reported on 10/12/2023) 3 mL 1   SYMBICORT  160-4.5 MCG/ACT inhaler INHALE TWO PUFFS BY MOUTH INTO LUNGS TWICE DAILY (Patient taking  differently: Inhale 2 puffs into the lungs 2 (two) times daily as needed (shortness of breath).) 10.2 g 11   XARELTO  20 MG TABS tablet TAKE ONE TABLET BY MOUTH DAILY AT 5PM WITH SUPPER 90 tablet 11   No current facility-administered medications on file prior to visit.   Health Maintenance  Topic Date Due   Hepatitis C Screening  Never done   DTaP/Tdap/Td vaccine (1 - Tdap) Never done   Zoster (Shingles) Vaccine (1 of 2) Never done   Colon Cancer Screening  Never done   Mammogram  Never done   COVID-19 Vaccine (2 - Janssen risk series) 11/07/2019   DEXA scan (bone density measurement)  Never done   Yearly kidney health urinalysis for diabetes  03/10/2022   Medicare Annual Wellness Visit  05/18/2023   Pneumonia Vaccine (1 of 2 - PCV) 05/28/2024*   Eye exam for diabetics  11/03/2023   Complete foot exam   11/07/2023   Hemoglobin A1C  12/19/2023   Flu Shot  02/09/2024   Yearly kidney function blood test for diabetes  06/27/2024   HPV Vaccine  Aged Out   Meningitis B Vaccine  Aged Out  *Topic was postponed. The date shown is not the original due date.    Objective:   Vitals:   10/25/23 1347  BP: 119/74  Pulse: 75  Resp: 16  SpO2: 100%  Weight: 246 lb (111.6 kg)   {Vitals History (Optional):23777}  Physical Exam  {Labs (Optional):23779}  Assessment & Plan   There are no diagnoses linked to this encounter.   Patient have been counseled extensively about nutrition and exercise. Other issues discussed during this visit include: low cholesterol diet, weight control and daily exercise, foot care, annual eye examinations at Ophthalmology, importance of adherence with medications and regular follow-up. We also discussed long term complications of uncontrolled diabetes and hypertension.   No follow-ups on file.  The patient was given clear instructions to go to ER or return to medical center if symptoms don't improve, worsen or new problems develop. The patient verbalized  understanding. The patient was told to call to get lab results if they haven't heard anything in the next week.   This note has been created with Education officer, environmental. Any transcriptional errors are unintentional.   Marius Siemens, NP 10/25/2023, 2:37 PM

## 2023-10-28 DIAGNOSIS — I679 Cerebrovascular disease, unspecified: Secondary | ICD-10-CM | POA: Diagnosis not present

## 2023-10-28 DIAGNOSIS — M25512 Pain in left shoulder: Secondary | ICD-10-CM | POA: Diagnosis not present

## 2023-10-28 DIAGNOSIS — J449 Chronic obstructive pulmonary disease, unspecified: Secondary | ICD-10-CM | POA: Diagnosis not present

## 2023-10-30 ENCOUNTER — Encounter (INDEPENDENT_AMBULATORY_CARE_PROVIDER_SITE_OTHER): Payer: Self-pay | Admitting: Primary Care

## 2023-11-02 ENCOUNTER — Ambulatory Visit (INDEPENDENT_AMBULATORY_CARE_PROVIDER_SITE_OTHER): Admitting: Primary Care

## 2023-11-02 ENCOUNTER — Encounter (INDEPENDENT_AMBULATORY_CARE_PROVIDER_SITE_OTHER): Payer: Self-pay | Admitting: Primary Care

## 2023-11-02 VITALS — BP 129/72 | HR 89 | Resp 16 | Wt 243.0 lb

## 2023-11-02 DIAGNOSIS — H61033 Chondritis of external ear, bilateral: Secondary | ICD-10-CM | POA: Diagnosis not present

## 2023-11-02 DIAGNOSIS — Z1231 Encounter for screening mammogram for malignant neoplasm of breast: Secondary | ICD-10-CM

## 2023-11-02 MED ORDER — CIPROFLOXACIN HCL 500 MG PO TABS: 500.0000 mg | ORAL_TABLET | Freq: Two times a day (BID) | ORAL | 0 refills | Status: AC

## 2023-11-02 NOTE — Patient Instructions (Signed)
 Fall Prevention in the Home, Adult Falls can cause injuries and can happen to people of all ages. There are many things you can do to make your home safer and to help prevent falls. What actions can I take to prevent falls? General information Use good lighting in all rooms. Make sure to: Replace any light bulbs that burn out. Turn on the lights in dark areas and use night-lights. Keep items that you use often in easy-to-reach places. Lower the shelves around your home if needed. Move furniture so that there are clear paths around it. Do not use throw rugs or other things on the floor that can make you trip. If any of your floors are uneven, fix them. Add color or contrast paint or tape to clearly mark and help you see: Grab bars or handrails. First and last steps of staircases. Where the edge of each step is. If you use a ladder or stepladder: Make sure that it is fully opened. Do not climb a closed ladder. Make sure the sides of the ladder are locked in place. Have someone hold the ladder while you use it. Know where your pets are as you move through your home. What can I do in the bathroom?     Keep the floor dry. Clean up any water on the floor right away. Remove soap buildup in the bathtub or shower. Buildup makes bathtubs and showers slippery. Use non-skid mats or decals on the floor of the bathtub or shower. Attach bath mats securely with double-sided, non-slip rug tape. If you need to sit down in the shower, use a non-slip stool. Install grab bars by the toilet and in the bathtub and shower. Do not use towel bars as grab bars. What can I do in the bedroom? Make sure that you have a light by your bed that is easy to reach. Do not use any sheets or blankets on your bed that hang to the floor. Have a firm chair or bench with side arms that you can use for support when you get dressed. What can I do in the kitchen? Clean up any spills right away. If you need to reach something  above you, use a step stool with a grab bar. Keep electrical cords out of the way. Do not use floor polish or wax that makes floors slippery. What can I do with my stairs? Do not leave anything on the stairs. Make sure that you have a light switch at the top and the bottom of the stairs. Make sure that there are handrails on both sides of the stairs. Fix handrails that are broken or loose. Install non-slip stair treads on all your stairs if they do not have carpet. Avoid having throw rugs at the top or bottom of the stairs. Choose a carpet that does not hide the edge of the steps on the stairs. Make sure that the carpet is firmly attached to the stairs. Fix carpet that is loose or worn. What can I do on the outside of my home? Use bright outdoor lighting. Fix the edges of walkways and driveways and fix any cracks. Clear paths of anything that can make you trip, such as tools or rocks. Add color or contrast paint or tape to clearly mark and help you see anything that might make you trip as you walk through a door, such as a raised step or threshold. Trim any bushes or trees on paths to your home. Check to see if handrails are loose  or broken and that both sides of all steps have handrails. Install guardrails along the edges of any raised decks and porches. Have leaves, snow, or ice cleared regularly. Use sand, salt, or ice melter on paths if you live where there is ice and snow during the winter. Clean up any spills in your garage right away. This includes grease or oil spills. What other actions can I take? Review your medicines with your doctor. Some medicines can cause dizziness or changes in blood pressure, which increase your risk of falling. Wear shoes that: Have a low heel. Do not wear high heels. Have rubber bottoms and are closed at the toe. Feel good on your feet and fit well. Use tools that help you move around if needed. These include: Canes. Walkers. Scooters. Crutches. Ask  your doctor what else you can do to help prevent falls. This may include seeing a physical therapist to learn to do exercises to move better and get stronger. Where to find more information Centers for Disease Control and Prevention, STEADI: TonerPromos.no [employer] on Aging: BaseRingTones.pl National Institute on Aging: BaseRingTones.pl Contact a doctor if: You are afraid of falling at home. You feel weak, drowsy, or dizzy at home. You fall at home. Get help right away if you: Lose consciousness or have trouble moving after a fall. Have a fall that causes a head injury. These symptoms may be an emergency. Get help right away. Call 911. Do not wait to see if the symptoms will go away. Do not drive yourself to the hospital. This information is not intended to replace advice given to you by your health care provider. Make sure you discuss any questions you have with your health care provider. Document Revised: 02/28/2022 Document Reviewed: 02/28/2022 Elsevier Patient Education  2024 [employer].

## 2023-11-02 NOTE — Progress Notes (Addendum)
 [facility]  Rhonda Hickman, is a 68 y.o. female  RDW:[id number]  FMW:[id number]  DOB - [birth year]  Chief Complaint  Patient presents with   Otalgia    B/l  Itching Can't bend ears        Subjective:   Rhonda Hickman is a 68 y.o. female here today for an acute visit. C/o  bilateral itching and difficulty bending ear lobes down. She also, voice increase in urinary incontinence cough , sneeze or dribbles. fluoroquinolone  No problems updated.  Comprehensive ROS Pertinent positive and negative noted in HPI   Allergies  Allergen Reactions   Aspirin  Nausea Only and Other (See Comments)    Causes stomach pain    Past Medical History:  Diagnosis Date   Anemia    Anxiety    Arthritis    Asthma    Atrial fibrillation (HCC)    Bipolar 1 disorder (HCC)    Bulging lumbar disc    Chronic pain of left knee    COPD (chronic obstructive pulmonary disease) (HCC)    CVA (cerebral vascular accident) (HCC)    Diabetes mellitus    GERD (gastroesophageal reflux disease)    Neuropathy    TIA (transient ischemic attack)    Vertigo     Current Outpatient Medications on File Prior to Visit  Medication Sig Dispense Refill   ACCU-CHEK GUIDE test strip CHECK BLOOD SUGAR UP TO FOUR TIMES DAILY AS DIRECTED 100 strip 11   amLODipine  (NORVASC ) 5 MG tablet TAKE 1 TABLET BY MOUTH DAILY 80 tablet 3   blood glucose meter kit and supplies Dispense based on patient and insurance preference. Use up to four times daily as directed. (FOR ICD-10 E10.9, E11.9). 1 each 0   buPROPion  (WELLBUTRIN  SR) 150 MG 12 hr tablet TAKE ONE TABLET BY MOUTH TWICE DAILY @ 9AM & 9PM 180 tablet 1   divalproex  (DEPAKOTE  ER) 500 MG 24 hr tablet Take 500-1,000 mg by mouth See admin instructions. Take 500 mg in the morning and 1000 mg at bedtime     FEROSUL 325 (65 Fe) MG tablet TAKE ONE TABLET BY MOUTH DAILY AT 9AM WITH BREAKFAST 90 tablet 11   gabapentin  (NEURONTIN ) 100 MG capsule TAKE 1 CAPSULE 3 TIMES A  DAY 60 capsule 0   HYDROcodone -acetaminophen  (NORCO) 10-325 MG tablet Take 1 tablet by mouth every 6 (six) hours as needed for moderate pain (pain score 4-6).     insulin  lispro (HUMALOG ) 100 UNIT/ML KwikPen Inject 20 Units into the skin 2 (two) times daily. (Patient taking differently: Inject 0-12 Units into the skin 2 (two) times daily as needed (If blood sugar is over 150). Sliding scale 0-150= None 151-200=2 201-250=4 251-300=6 301-350=8 351-400=10 401=12 units and contact doctor) 15 mL 3   Insulin  Pen Needle (GNP ULTICARE PEN NEEDLES) 32G X 4 MM MISC USE TO INJECT INSULIN  UP TO 5 TIMES A DAY 200 each 2   Insulin  Syringe-Needle U-100 (ADVOCATE INSULIN  SYRINGE) 29G X 1/2 0.3 ML MISC Use to inject insulin  5x daily. 200 each 6   LANTUS  SOLOSTAR 100 UNIT/ML Solostar Pen INJECT SUBCUTANEOUSLY 20 UNITS  TWICE DAILY (Patient taking differently: Inject 24 Units into the skin 2 (two) times daily.) 45 mL 2   lidocaine  (LIDODERM ) 5 % Place 1 patch onto the skin daily. Remove & Discard patch within 12 hours or as directed by MD 5 patch 0   metFORMIN  (GLUCOPHAGE ) 1000 MG tablet Take 1 tablet (1,000 mg total) by mouth 2 (two) times daily.  TAKE ONE TABLET BY MOUTH TWICE DAILY @ 9AM & 5PM WITH MEALS 180 tablet 1   methocarbamol  (ROBAXIN ) 500 MG tablet Take 1 tablet (500 mg total) by mouth every 8 (eight) hours as needed for muscle spasms. 30 tablet 0   metoprolol  tartrate (LOPRESSOR ) 50 MG tablet TAKE ONE TABLET BY MOUTH TWICE DAILY @ 9AM & 5PM 180 tablet 3   oxybutynin  (DITROPAN ) 5 MG tablet TAKE 1 TABLET BY MOUTH 2 TO 3  TIMES DAILY FOR BLADDER SPASMS 180 tablet 1   rosuvastatin  (CRESTOR ) 10 MG tablet Take 1 tablet (10 mg total) by mouth daily. 90 tablet 2   Semaglutide ,0.25 or 0.5MG /DOS, (OZEMPIC , 0.25 OR 0.5 MG/DOSE,) 2 MG/3ML SOPN Inject 0.5 mg into the skin once a week. (Patient not taking: Reported on 10/12/2023) 3 mL 1   SYMBICORT  160-4.5 MCG/ACT inhaler INHALE TWO PUFFS BY MOUTH INTO LUNGS TWICE  DAILY (Patient taking differently: Inhale 2 puffs into the lungs 2 (two) times daily as needed (shortness of breath).) 10.2 g 11   XARELTO  20 MG TABS tablet TAKE ONE TABLET BY MOUTH DAILY AT 5PM WITH SUPPER 90 tablet 11   No current facility-administered medications on file prior to visit.   Health Maintenance  Topic Date Due   Hepatitis C Screening  Never done   DTaP/Tdap/Td vaccine (1 - Tdap) Never done   Zoster (Shingles) Vaccine (1 of 2) Never done   Colon Cancer Screening  Never done   Mammogram  Never done   COVID-19 Vaccine (2 - Janssen risk series) 11/07/2019   DEXA scan (bone density measurement)  Never done   Yearly kidney health urinalysis for diabetes  03/10/2022   Medicare Annual Wellness Visit  05/18/2023   Eye exam for diabetics  11/03/2023   Pneumonia Vaccine (1 of 2 - PCV) 05/28/2024*   Complete foot exam   11/07/2023   Hemoglobin A1C  12/19/2023   Flu Shot  02/09/2024   Yearly kidney function blood test for diabetes  06/27/2024   HPV Vaccine  Aged Out   Meningitis B Vaccine  Aged Out  *Topic was postponed. The date shown is not the original due date.    Objective:   Vitals:   11/02/23 1053  BP: 129/72  Pulse: 89  Resp: 16  SpO2: 100%  Weight: 243 lb (110.2 kg)   BP Readings from Last 3 Encounters:  11/02/23 129/72  10/25/23 119/74  10/12/23 117/71   Physical Exam Constitutional:      Appearance: She is obese.  HENT:     Ears:     Comments: Bending ears painful Cardiovascular:     Rate and Rhythm: Normal rate and regular rhythm.  Pulmonary:     Effort: Pulmonary effort is normal.     Breath sounds: Normal breath sounds.  Abdominal:     General: Abdomen is flat. Bowel sounds are normal.     Palpations: Abdomen is soft.  Musculoskeletal:     Cervical back: Normal range of motion.     Comments: LE weakness  Neurological:     Mental Status: She is alert and oriented to person, place, and time.  Psychiatric:        Mood and Affect: Mood normal.         Behavior: Behavior normal.    Assessment & Plan   Chondritis of auricle, bilateral -     Ciprofloxacin  HCl; Take 1 tablet (500 mg total) by mouth 2 (two) times daily for 3 days.  Dispense: 6  tablet; Refill: 0  Encounter for screening mammogram for malignant neoplasm of breast -     Digital Screening Mammogram, Left and Right; Future     Patient have been counseled extensively about nutrition and exercise. Other issues discussed during this visit include: low cholesterol diet, weight control and daily exercise, foot care, annual eye examinations at Ophthalmology, importance of adherence with medications and regular follow-up. We also discussed long term complications of uncontrolled diabetes and hypertension.   Return in about 6 months (around 05/03/2024) for PCP, fasting labs.  The patient was given clear instructions to go to ER or return to medical center if symptoms don't improve, worsen or new problems develop. The patient verbalized understanding. The patient was told to call to get lab results if they haven't heard anything in the next week.   This note has been created with Education officer, environmental. Any transcriptional errors are unintentional.   [name], NP 11/04/2023, 6:42 PM

## 2023-11-14 DIAGNOSIS — M6281 Muscle weakness (generalized): Secondary | ICD-10-CM | POA: Diagnosis not present

## 2023-11-17 DIAGNOSIS — F17210 Nicotine dependence, cigarettes, uncomplicated: Secondary | ICD-10-CM | POA: Diagnosis not present

## 2023-11-17 DIAGNOSIS — Z79899 Other long term (current) drug therapy: Secondary | ICD-10-CM | POA: Diagnosis not present

## 2023-11-17 DIAGNOSIS — G8929 Other chronic pain: Secondary | ICD-10-CM | POA: Diagnosis not present

## 2023-11-17 DIAGNOSIS — M5416 Radiculopathy, lumbar region: Secondary | ICD-10-CM | POA: Diagnosis not present

## 2023-11-17 DIAGNOSIS — M25561 Pain in right knee: Secondary | ICD-10-CM | POA: Diagnosis not present

## 2023-11-17 DIAGNOSIS — M25562 Pain in left knee: Secondary | ICD-10-CM | POA: Diagnosis not present

## 2023-11-20 NOTE — Progress Notes (Deleted)
 Cardiology Office Note:    Date:  11/20/2023   ID:  Rhonda Hickman, DOB 1956-04-20, MRN [id number]  PCP:  Marius Siemens, NP  Cardiologist:  Wendie Hamburg, MD  Electrophysiologist:  None   Referring MD: Marius Siemens, NP   No chief complaint on file.   History of Present Illness:    Rhonda Hickman is a 68 y.o. female with a hx of TIA, atrial fibrillation, BPD, COPD, T2DM, asthma who presents for follow-up.  She was referred by Madelyn Schick, NP for evaluation of atrial fibrillation, initially seen on 12/16/2019.  She reports that she was in the hospital for 5 days in [place]  in May 2021.  States that she was admitted with AKI and UTI and found to have atrial fibrillation.  Started on Xarelto .  States that she has been taking this without any bleeding issues.  Reports she has had intermittent palpitations over the last month since her discharge from the hospital.  She reports a history of stabbing chest pain in the center of her chest but has not had any recently, does however chest pressure with exertion.  States that going for walks causes her to have exertional chest pressure.  She is smoking about half a pack per day, down from peak of 2 packs/day.  States that both her mother and father had MIs but unsure of age.  TTE on 08/03/2019 showed LVEF 60-65%, normal RV function, normal biatrial size, no significant valvular disease.  Lexiscan  Myoview  on 01/03/2020 showed low risk stress test with breast attenuation but no ischemia, LVEF 48%.  Carotid duplex on 01/14/2020 showed bilateral 1 to 39% stenosis.  Zio patch x14 days on 01/29/2020 showed 1% atrial flutter burden, average heart rate 86 bpm.  Zio patch x3 days on 05/25/2020 showed no significant arrhythmias.  Echocardiogram 08/26/2022 showed normal biventricular function, grade 1 diastolic function, no significant valvular disease.  Coronary CTA on 11/03/2022 showed mild CAD (1 to 24% stenosis in proximal/mid LAD,  D1, and proximal/mid RCA), calcium  score 55 (80th percentile).  Since last clinic visit, she reports she has been under a lot of stress, reports her brother was recently diagnosed with cancer.  She continues to have intermittent chest pain, describes as pain sometimes in center of chest but sometimes in back.  Occurs about once per week.  Lasts less than 1 minute.  No relationship with exertion, but feels it may be related to stress.  She continues to have shortness of breath.  She is mostly staying in wheelchair now due to her knee pain but will sometimes walk with a walker.  She continues to smoke, currently about 1 pack/week.   Past Medical History:  Diagnosis Date   Anemia    Anxiety    Arthritis    Asthma    Atrial fibrillation (HCC)    Bipolar 1 disorder (HCC)    Bulging lumbar disc    Chronic pain of left knee    COPD (chronic obstructive pulmonary disease) (HCC)    CVA (cerebral vascular accident) (HCC)    Diabetes mellitus    GERD (gastroesophageal reflux disease)    Neuropathy    TIA (transient ischemic attack)    Vertigo     Past Surgical History:  Procedure Laterality Date   ABDOMINAL HYSTERECTOMY     CARPAL TUNNEL RELEASE Right    CHOLECYSTECTOMY     KNEE ARTHROSCOPY WITH LATERAL MENISECTOMY Left 06/08/2021   Procedure: KNEE ARTHROSCOPY WITH LATERAL MENISCECTOMY AND MEDIAL MENISCECTOMY;  Surgeon: Darrin Emerald, MD;  Location: [facility];  Service: Orthopedics;  Laterality: Left;   KNEE ARTHROSCOPY WITH MEDIAL MENISECTOMY Right 04/14/2016   Procedure: KNEE ARTHROSCOPY WITH MEDIAL AND LATERAL MENISECTOMY, MICROFRACTURE REPAIR;  Surgeon: Darrin Emerald, MD;  Location: [facility];  Service: Orthopedics;  Laterality: Right;  lateral menisectomy - needs crutch training   TOTAL KNEE ARTHROPLASTY Left 06/20/2023   Procedure: LEFT TOTAL KNEE ARTHROPLASTY;  Surgeon: Arnie Lao, MD;  Location: [facility];  Service: Orthopedics;  Laterality: Left;    Current  Medications: No outpatient medications have been marked as taking for the 11/21/23 encounter (Appointment) with Wendie Hamburg, MD.     Allergies:   Aspirin    Social History   Socioeconomic History   Marital status: Single    Spouse name: Not on file   Number of children: 0   Years of education: 12   Highest education level: High school graduate  Occupational History   Occupation: Unemployed  Tobacco Use   Smoking status: Some Days    Current packs/day: 0.50    Types: Cigarettes   Smokeless tobacco: Never   Tobacco comments:    tobacco info given  Vaping Use   Vaping status: Former  Substance and Sexual Activity   Alcohol use: No   Drug use: No   Sexual activity: Never  Other Topics Concern   Not on file  Social History Narrative   Lives at home alone.   Right-handed.   No caffeine use.   Social Drivers of Corporate investment banker Strain: Not on file  Food Insecurity: Not on file  Transportation Needs: Not on file  Physical Activity: Not on file  Stress: Not on file  Social Connections: Not on file     Family History: The patient's family history includes Diabetes in her mother; Heart attack in her father.  ROS:   Please see the history of present illness.     All other systems reviewed and are negative.  EKGs/Labs/Other Studies Reviewed:    The following studies were reviewed today:   EKG:   06/14/22: Normal sinus rhythm, rate 77, left axis deviation, poor R wave progression 04/19/2023: Normal sinus rhythm, rate 73, left axis deviation  Recent Labs: 06/28/2023: ALT 15; BUN 7; Creatinine, Ser 1.03; Potassium 3.1; Sodium 138 10/12/2023: Hemoglobin 11.3; Platelets 320  Recent Lipid Panel    Component Value Date/Time   CHOL 119 05/29/2023 1447   TRIG 109 05/29/2023 1447   HDL 36 (L) 05/29/2023 1447   CHOLHDL 3.3 05/29/2023 1447   CHOLHDL 11.2 08/04/2019 0548   VLDL 32 08/04/2019 0548   LDLCALC 63 05/29/2023 1447    Physical Exam:     VS:  There were no vitals taken for this visit.    Wt Readings from Last 3 Encounters:  11/02/23 243 lb (110.2 kg)  10/25/23 246 lb (111.6 kg)  10/12/23 254 lb (115.2 kg)     GEN: in no acute distress HEENT: Normal NECK: No JVD LYMPHATICS: No lymphadenopathy CARDIAC: RRR, no murmurs, rubs, gallops RESPIRATORY:  Clear to auscultation without rales, wheezing or rhonchi  ABDOMEN: Soft, non-tender, non-distended MUSCULOSKELETAL:  No edema SKIN: Warm and dry NEUROLOGIC:  Alert and oriented x 3 PSYCHIATRIC:  Normal affect   ASSESSMENT:    No diagnosis found.    PLAN:     Chest pain: Atypical in description but does have significant risk factors (type 2 diabetes, hypertension, tobacco use).  TTE on 08/03/2019 showed LVEF  60-65%, normal RV function, normal biatrial size, no significant valvular disease.  Lexiscan  Myoview  on 01/03/2020 showed low risk stress test with breast attenuation but no ischemia, LVEF 48%.  Given ongoing chest pain, Coronary CTA was done on 11/03/2022 showed mild CAD (1 to 24% stenosis in proximal/mid LAD, D1, and proximal/mid RCA), calcium  score 55 (80th percentile).  Dizziness: unclear cause.  Head CT negative.  Othostatics in clinic were unremarkable.  Possibly vertigo by description, started on meclizine  and referred to neurology for further evaluation.  Brain MRI on 06/16/2020 was unremarkable.  Zio patch x3 days on 05/25/2020 showed no significant arrhythmias.  Atrial fibrillation: CHA2DS2-VASc score 5 (hypertension, diabetes, CVA, female).  Zio patch x14 days on 01/29/2020 showed 1% atrial flutter burden, average heart rate 86 bpm. -Continue Xarelto .   -Continue metoprolol  25 mg twice daily  Hypertension: On amlodipine  5 mg daily, metoprolol  50 mg twice daily.  Appears controlled  T2DM: On insulin .  A1c 7.3% on 02/2023  Hyperlipidemia: On rosuvastatin  10 mg daily.  LDL 63 05/2023  Tobacco use: Patient counseled on the risks of tobacco use and cessation  strongly encouraged.    TIA: Follows with neurology.  Carotid duplex on 01/14/2020 showed bilateral 1 to 39% stenosis.  Zio patch x14 days on 01/29/2020 showed 1% atrial flutter burden, average heart rate 86 bpm.  Snoring: sleep study with mild OSA, no indication for CPAP at this time  Rectal bleeding: Has noted intermittent bright red blood per rectum.  On Xarelto  for atrial fibrillation.  Will check CBC.  Had planned for colonoscopy but reports did not have done, encouraged follow-up***  RTC in 6 months  Medication Adjustments/Labs and Tests Ordered: Current medicines are reviewed at length with the patient today.  Concerns regarding medicines are outlined above.  No orders of the defined types were placed in this encounter.  No orders of the defined types were placed in this encounter.   There are no Patient Instructions on file for this visit.   Signed, Wendie Hamburg, MD  11/20/2023 10:59 PM    [facility]

## 2023-11-21 ENCOUNTER — Ambulatory Visit: Payer: 59 | Admitting: Cardiology

## 2023-11-27 DIAGNOSIS — M25512 Pain in left shoulder: Secondary | ICD-10-CM | POA: Diagnosis not present

## 2023-11-27 DIAGNOSIS — I679 Cerebrovascular disease, unspecified: Secondary | ICD-10-CM | POA: Diagnosis not present

## 2023-11-27 DIAGNOSIS — J449 Chronic obstructive pulmonary disease, unspecified: Secondary | ICD-10-CM | POA: Diagnosis not present

## 2023-12-06 ENCOUNTER — Telehealth (INDEPENDENT_AMBULATORY_CARE_PROVIDER_SITE_OTHER): Payer: Self-pay

## 2023-12-06 ENCOUNTER — Telehealth: Payer: Self-pay | Admitting: Physician Assistant

## 2023-12-06 NOTE — Telephone Encounter (Signed)
 Patient called and said he wrote her a referral for outpatient PT but she had death in the family so she couldn't do it. She needs the referral to be resent. CB#[phone]

## 2023-12-06 NOTE — Telephone Encounter (Unsigned)
 Copied from CRM [id number]. Topic: Referral - Request for Referral >> Dec 06, 2023  1:02 PM Fredrica W wrote: Did the patient discuss referral with their provider in the last year? Yes (If No - schedule appointment) (If Yes - send message)  Appointment offered? No  Type of order/referral and detailed reason for visit: Therapy - recommended by provider - they said they need new referral   Preference of office, provider, location: [facility] [address]   If referral order, have you been seen by this specialty before? Yes - same provider (If Yes, this issue or another issue? When? Where?  Can we respond through MyChart? No

## 2023-12-07 ENCOUNTER — Other Ambulatory Visit: Payer: Self-pay | Admitting: Radiology

## 2023-12-07 ENCOUNTER — Other Ambulatory Visit (INDEPENDENT_AMBULATORY_CARE_PROVIDER_SITE_OTHER): Payer: Self-pay | Admitting: Primary Care

## 2023-12-07 DIAGNOSIS — Z96652 Presence of left artificial knee joint: Secondary | ICD-10-CM

## 2023-12-07 DIAGNOSIS — M1712 Unilateral primary osteoarthritis, left knee: Secondary | ICD-10-CM

## 2023-12-07 DIAGNOSIS — M170 Bilateral primary osteoarthritis of knee: Secondary | ICD-10-CM

## 2023-12-07 DIAGNOSIS — M1711 Unilateral primary osteoarthritis, right knee: Secondary | ICD-10-CM

## 2023-12-07 DIAGNOSIS — E66813 Obesity, class 3: Secondary | ICD-10-CM

## 2023-12-07 DIAGNOSIS — R269 Unspecified abnormalities of gait and mobility: Secondary | ICD-10-CM

## 2023-12-07 NOTE — Telephone Encounter (Signed)
Called Patient aware.

## 2023-12-07 NOTE — Telephone Encounter (Signed)
 New referral has been sent

## 2023-12-09 ENCOUNTER — Other Ambulatory Visit (INDEPENDENT_AMBULATORY_CARE_PROVIDER_SITE_OTHER): Payer: Self-pay | Admitting: Primary Care

## 2023-12-09 DIAGNOSIS — I152 Hypertension secondary to endocrine disorders: Secondary | ICD-10-CM

## 2023-12-09 DIAGNOSIS — E782 Mixed hyperlipidemia: Secondary | ICD-10-CM

## 2023-12-09 DIAGNOSIS — Z760 Encounter for issue of repeat prescription: Secondary | ICD-10-CM

## 2023-12-11 ENCOUNTER — Other Ambulatory Visit: Payer: Self-pay | Admitting: Family Medicine

## 2023-12-11 NOTE — Telephone Encounter (Signed)
 Requested Prescriptions  Refused Prescriptions Disp Refills   amLODipine  (NORVASC ) 5 MG tablet [Pharmacy Med Name: amlodipine  5 mg tablet] 150 tablet 0    Sig: TAKE ONE TABLET DAILY AT 9 AM     Cardiovascular: Calcium  Channel Blockers 2 Failed - 12/11/2023  2:39 PM      Failed - Valid encounter within last 6 months    Recent Outpatient Visits           1 month ago Chondritis of auricle, bilateral   [place] [facility] Marius Siemens, NP   1 month ago Otalgia of both ears   [place] [facility] Marius Siemens, NP   2 months ago Hospital discharge follow-up   [place] [facility] Marius Siemens, NP   3 months ago Gait instability   [place] [facility] Marius Siemens, NP   6 months ago Type 2 diabetes mellitus with hyperosmolarity without coma, with long-term current use of insulin  [facility])   [place] [facility] Marius Siemens, NP       Future Appointments             In 2 months Wendie Hamburg, MD [facility]            Passed - Last BP in normal range    BP Readings from Last 1 Encounters:  11/02/23 129/72         Passed - Last Heart Rate in normal range    Pulse Readings from Last 1 Encounters:  11/02/23 89

## 2023-12-14 ENCOUNTER — Other Ambulatory Visit (INDEPENDENT_AMBULATORY_CARE_PROVIDER_SITE_OTHER): Payer: Self-pay | Admitting: Primary Care

## 2023-12-14 DIAGNOSIS — E1100 Type 2 diabetes mellitus with hyperosmolarity without nonketotic hyperglycemic-hyperosmolar coma (NKHHC): Secondary | ICD-10-CM

## 2023-12-14 DIAGNOSIS — M6281 Muscle weakness (generalized): Secondary | ICD-10-CM | POA: Diagnosis not present

## 2023-12-14 NOTE — Telephone Encounter (Signed)
 Requested Prescriptions  Pending Prescriptions Disp Refills   insulin  glargine (LANTUS  SOLOSTAR) 100 UNIT/ML Solostar Pen [Pharmacy Med Name: Lantus  SoloStar 100 UNIT/ML Subcutaneous Solution Pen-injector] 45 mL 2    Sig: Inject 24 Units into the skin 2 (two) times daily.     Endocrinology:  Diabetes - Insulins Failed - 12/14/2023 11:31 AM      Failed - Valid encounter within last 6 months    Recent Outpatient Visits           1 month ago Chondritis of auricle, bilateral   [place] [facility] Marius Siemens, NP   1 month ago Otalgia of both ears   [place] [facility] Marius Siemens, NP   2 months ago Hospital discharge follow-up   [place] [facility] Marius Siemens, NP   3 months ago Gait instability   [place] [facility] Marius Siemens, NP   6 months ago Type 2 diabetes mellitus with hyperosmolarity without coma, with long-term current use of insulin  [facility])   [place] [facility] Marius Siemens, NP       Future Appointments             In 2 months Wendie Hamburg, MD [facility]            Passed - HBA1C is between 0 and 7.9 and within 180 days    HbA1c, POC (controlled diabetic range)  Date Value Ref Range Status  05/29/2023 6.8 0.0 - 7.0 % Final   Hgb A1c MFr Bld  Date Value Ref Range Status  06/20/2023 6.8 (H) 4.8 - 5.6 % Final    Comment:    (NOTE) Pre diabetes:          5.7%-6.4%  Diabetes:              >6.4%  Glycemic control for   <7.0% adults with diabetes

## 2023-12-15 ENCOUNTER — Telehealth: Payer: Self-pay

## 2023-12-15 NOTE — Telephone Encounter (Signed)
 Form has been received just waiting for a signature

## 2023-12-15 NOTE — Telephone Encounter (Signed)
 Copied from CRM [id number]. Topic: Clinical - Prescription Issue >> Dec 14, 2023  5:15 PM [place] wrote: Reason for CRM: Rhonda with [facility], need update on order for incontinence supplies [phone]

## 2023-12-15 NOTE — Telephone Encounter (Signed)
 Form is in basket.

## 2023-12-18 ENCOUNTER — Ambulatory Visit (HOSPITAL_COMMUNITY)

## 2023-12-18 NOTE — Therapy (Deleted)
 OUTPATIENT PHYSICAL THERAPY LOWER EXTREMITY EVALUATION   Patient Name: Rhonda Hickman MRN: [id number] DOB:February 27, 1956, 68 y.o., female Today's Date: 12/18/2023  END OF SESSION:   Past Medical History:  Diagnosis Date   Anemia    Anxiety    Arthritis    Asthma    Atrial fibrillation (HCC)    Bipolar 1 disorder (HCC)    Bulging lumbar disc    Chronic pain of left knee    COPD (chronic obstructive pulmonary disease) (HCC)    CVA (cerebral vascular accident) (HCC)    Diabetes mellitus    GERD (gastroesophageal reflux disease)    Neuropathy    TIA (transient ischemic attack)    Vertigo    Past Surgical History:  Procedure Laterality Date   ABDOMINAL HYSTERECTOMY     CARPAL TUNNEL RELEASE Right    CHOLECYSTECTOMY     KNEE ARTHROSCOPY WITH LATERAL MENISECTOMY Left 06/08/2021   Procedure: KNEE ARTHROSCOPY WITH LATERAL MENISCECTOMY AND MEDIAL MENISCECTOMY;  Surgeon: Darrin Emerald, MD;  Location: [facility];  Service: Orthopedics;  Laterality: Left;   KNEE ARTHROSCOPY WITH MEDIAL MENISECTOMY Right 04/14/2016   Procedure: KNEE ARTHROSCOPY WITH MEDIAL AND LATERAL MENISECTOMY, MICROFRACTURE REPAIR;  Surgeon: Darrin Emerald, MD;  Location: [facility];  Service: Orthopedics;  Laterality: Right;  lateral menisectomy - needs crutch training   TOTAL KNEE ARTHROPLASTY Left 06/20/2023   Procedure: LEFT TOTAL KNEE ARTHROPLASTY;  Surgeon: Arnie Lao, MD;  Location: [facility];  Service: Orthopedics;  Laterality: Left;   Patient Active Problem List   Diagnosis Date Noted   OA (osteoarthritis) of knee 06/20/2023   Status post total left knee replacement 06/20/2023   Unilateral primary osteoarthritis, left knee 03/20/2023   Lumbar radiculopathy 02/01/2023   S/P left knee arthroscopy11/29/22 07/13/2021   Tear of meniscus of knee joint    Acute lower UTI 05/23/2021   A-fib (HCC) 05/22/2021   Leukocytosis 05/22/2021   Fall at home, initial encounter 05/22/2021    Class: Acute   HLD  (hyperlipidemia) 05/22/2021   Hypertension 03/18/2020   Bipolar 1 disorder (HCC)    Chronic atrial fibrillation (HCC) 12/03/2019   TIA (transient ischemic attack) 12/03/2019   Diabetic peripheral neuropathy (HCC) 12/03/2019   Chronic migraine 12/03/2019   Vertigo 08/03/2019   Bronchiectasis with acute exacerbation (HCC)    Obesity, Class III, BMI 40-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)    Gastroesophageal reflux disease    Asthma exacerbation 06/06/2018   Gait abnormality 08/29/2017   Paresthesia 08/29/2017   Derangement of posterior horn of medial meniscus of right knee    Meniscus, lateral, derangement, right    Unilateral primary osteoarthritis, right knee    COPD with acute exacerbation (HCC) 03/09/2016   Type 2 diabetes mellitus (HCC) 02/16/2007   Cocaine abuse (HCC) 02/16/2007   Extrinsic asthma 02/16/2007   HOMELESSNESS, HX OF 02/16/2007    PCP: Marius Siemens, NP  REFERRING PROVIDER: Bronson Canny, PA-C  REFERRING DIAG: [phone] (ICD-10-CM) - Hx of total knee arthroplasty, left   THERAPY DIAG:  No diagnosis found.  Rationale for Evaluation and Treatment: Rehabilitation  ONSET DATE: ***  SUBJECTIVE:   SUBJECTIVE STATEMENT: ***  PERTINENT HISTORY: *** PAIN:  Are you having pain? {OPRCPAIN:27236}  PRECAUTIONS: {Therapy precautions:24002}  RED FLAGS: {PT Red Flags:29287}   WEIGHT BEARING RESTRICTIONS: {Yes ***/No:24003}  FALLS:  Has patient fallen in last 6 months? {fallsyesno:27318}   PATIENT GOALS: ***  NEXT MD VISIT: ***  OBJECTIVE:  Note: Objective measures were completed at Evaluation unless  otherwise noted.  DIAGNOSTIC FINDINGS: ***  PATIENT SURVEYS:   LEFS: ***   COGNITION: Overall cognitive status: {cognition:24006}     SENSATION: {sensation:27233}  POSTURE: {posture:25561}  PALPATION: ***  LOWER EXTREMITY ROM:  Active ROM Right eval Left eval  Hip flexion    Hip extension    Hip abduction    Hip adduction    Hip internal  rotation    Hip external rotation    Knee flexion    Knee extension    Ankle dorsiflexion    Ankle plantarflexion    Ankle inversion    Ankle eversion     (Blank rows = not tested)  LOWER EXTREMITY MMT:  MMT Right eval Left eval  Hip flexion    Hip extension    Hip abduction    Hip adduction    Hip internal rotation    Hip external rotation    Knee flexion    Knee extension    Ankle dorsiflexion    Ankle plantarflexion    Ankle inversion    Ankle eversion     (Blank rows = not tested)  FUNCTIONAL TESTS:  .30 Second Chair Stand Test: ***  Norms:   Age 72-64 33-69 12-74 75-79 59-84 65-89 90-94  Women 15 15 14 13 12 11 9   Men 17 16 15 14 13 11 9    TUG: ***  GAIT: Distance walked: Administrator, arts utilized: {Assistive devices:23999} Level of assistance: {Levels of assistance:24026} Comments: ***                                                                                                                                TREATMENT DATE: ***    PATIENT EDUCATION:  Education details: PT Evaluation, findings, prognosis, frequency, attendance policy, and ***. Person educated: Patient Education method: Medical illustrator Education comprehension: verbalized understanding  HOME EXERCISE PROGRAM: ***  ASSESSMENT:  CLINICAL IMPRESSION: Patient is a *** y.o. *** who was seen today for physical therapy evaluation and treatment for Z96.652 (ICD-10-CM) - Hx of total knee arthroplasty, left. ***. Pt will benefit from skilled Physical Therapy services to address deficits/limitations in order to improve functional and QOL.    OBJECTIVE IMPAIRMENTS: {opptimpairments:25111}.   ACTIVITY LIMITATIONS: {activitylimitations:27494}  PARTICIPATION LIMITATIONS: {participationrestrictions:25113}  PERSONAL FACTORS: {Personal factors:25162} are also affecting patient's functional outcome.   REHAB POTENTIAL: {rehabpotential:25112}  CLINICAL DECISION MAKING:  {clinical decision making:25114}  EVALUATION COMPLEXITY: {Evaluation complexity:25115}   GOALS: Goals reviewed with patient? {yes/no:20286}  SHORT TERM GOALS: Target date: ***  Pt will be independent with HEP in order to demonstrate participation in Physical Therapy POC.  Baseline: Goal status: {GOALSTATUS:25110}  2.  Pt will ***/10 pain during mobility in order to demonstrate improved pain while performing ADLs.  Baseline:  Goal status: {GOALSTATUS:25110}  LONG TERM GOALS: Target date: ***  Pt will improve 30 Second Chair Stand Test by *** in order to demonstrate improved functional strength to return to  desired activities.  Baseline: see objective.  Goal status: {GOALSTATUS:25110}  2.  Pt will improve 2 MWT by *** in order to demonstrate improved functional ambulatory capacity in community setting.  Baseline: see objective.  Goal status: {GOALSTATUS:25110}  3.  Pt will improve LEFS score by *** in order to demonstrate improved pain with functional goals and outcomes. Baseline: see objective.  Goal status: {GOALSTATUS:25110}  4.  Pt will improve *** by *** in order to improve *** during functional activities.. Baseline: see objective.  Goal status: {GOALSTATUS:25110}  5.  Pt will improve *** by *** in order to improve *** during functional activities.. Baseline: see objective.  Goal status: {GOALSTATUS:25110}   PLAN:  PT FREQUENCY: {rehab frequency:25116}  PT DURATION: {rehab duration:25117}  PLANNED INTERVENTIONS: {rehab planned interventions:25118::"97110-Therapeutic exercises","97530- Therapeutic [phone]- Neuromuscular re-education","97535- Self JXBJ","47829- Manual therapy"}  PLAN FOR NEXT SESSION: ***  Astrid Lay, DPT [facility] [place] [address] [phone] office  Gatha Kaska, PT 12/18/2023, 8:29 AM

## 2023-12-20 DIAGNOSIS — F17210 Nicotine dependence, cigarettes, uncomplicated: Secondary | ICD-10-CM | POA: Diagnosis not present

## 2023-12-20 DIAGNOSIS — G8929 Other chronic pain: Secondary | ICD-10-CM | POA: Diagnosis not present

## 2023-12-20 DIAGNOSIS — M25562 Pain in left knee: Secondary | ICD-10-CM | POA: Diagnosis not present

## 2023-12-20 DIAGNOSIS — Z79899 Other long term (current) drug therapy: Secondary | ICD-10-CM | POA: Diagnosis not present

## 2023-12-20 DIAGNOSIS — M25561 Pain in right knee: Secondary | ICD-10-CM | POA: Diagnosis not present

## 2023-12-20 DIAGNOSIS — Z79891 Long term (current) use of opiate analgesic: Secondary | ICD-10-CM | POA: Diagnosis not present

## 2023-12-20 DIAGNOSIS — M5416 Radiculopathy, lumbar region: Secondary | ICD-10-CM | POA: Diagnosis not present

## 2023-12-24 NOTE — Telephone Encounter (Signed)
 done

## 2023-12-25 NOTE — Telephone Encounter (Signed)
 Form was faxed back 12/22/23

## 2024-01-08 NOTE — Therapy (Signed)
 OUTPATIENT PHYSICAL THERAPY LOWER EXTREMITY EVALUATION   Patient Name: Rhonda Hickman MRN: [id number] DOB:1956-06-23, 68 y.o., female Today's Date: 01/09/2024  END OF SESSION:  PT End of Session - 01/09/24 0903     Visit Number 1    Number of Visits 12    Date for PT Re-Evaluation 02/20/24    Authorization Type [employer] Medicare dual complete    Authorization Time Period no auth; no limit    Progress Note Due on Visit 10    PT Start Time 0847    PT Stop Time 0928    PT Time Calculation (min) 41 min    Activity Tolerance Patient limited by lethargy    Behavior During Therapy Flat affect   lethargic         Past Medical History:  Diagnosis Date   Anemia    Anxiety    Arthritis    Asthma    Atrial fibrillation (HCC)    Bipolar 1 disorder (HCC)    Bulging lumbar disc    Chronic pain of left knee    COPD (chronic obstructive pulmonary disease) (HCC)    CVA (cerebral vascular accident) (HCC)    Diabetes mellitus    GERD (gastroesophageal reflux disease)    Neuropathy    TIA (transient ischemic attack)    Vertigo    Past Surgical History:  Procedure Laterality Date   ABDOMINAL HYSTERECTOMY     CARPAL TUNNEL RELEASE Right    CHOLECYSTECTOMY     KNEE ARTHROSCOPY WITH LATERAL MENISECTOMY Left 06/08/2021   Procedure: KNEE ARTHROSCOPY WITH LATERAL MENISCECTOMY AND MEDIAL MENISCECTOMY;  Surgeon: Margrette Taft BRAVO, MD;  Location: [facility];  Service: Orthopedics;  Laterality: Left;   KNEE ARTHROSCOPY WITH MEDIAL MENISECTOMY Right 04/14/2016   Procedure: KNEE ARTHROSCOPY WITH MEDIAL AND LATERAL MENISECTOMY, MICROFRACTURE REPAIR;  Surgeon: Taft BRAVO Margrette, MD;  Location: [facility];  Service: Orthopedics;  Laterality: Right;  lateral menisectomy - needs crutch training   TOTAL KNEE ARTHROPLASTY Left 06/20/2023   Procedure: LEFT TOTAL KNEE ARTHROPLASTY;  Surgeon: Vernetta Lonni GRADE, MD;  Location: [facility];  Service: Orthopedics;  Laterality: Left;   Patient Active Problem List    Diagnosis Date Noted   OA (osteoarthritis) of knee 06/20/2023   Status post total left knee replacement 06/20/2023   Unilateral primary osteoarthritis, left knee 03/20/2023   Lumbar radiculopathy 02/01/2023   S/P left knee arthroscopy11/29/22 07/13/2021   Tear of meniscus of knee joint    Acute lower UTI 05/23/2021   A-fib (HCC) 05/22/2021   Leukocytosis 05/22/2021   Fall at home, initial encounter 05/22/2021    Class: Acute   HLD (hyperlipidemia) 05/22/2021   Hypertension 03/18/2020   Bipolar 1 disorder (HCC)    Chronic atrial fibrillation (HCC) 12/03/2019   TIA (transient ischemic attack) 12/03/2019   Diabetic peripheral neuropathy (HCC) 12/03/2019   Chronic migraine 12/03/2019   Vertigo 08/03/2019   Bronchiectasis with acute exacerbation (HCC)    Obesity, Class III, BMI 40-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)    Gastroesophageal reflux disease    Asthma exacerbation 06/06/2018   Gait abnormality 08/29/2017   Paresthesia 08/29/2017   Derangement of posterior horn of medial meniscus of right knee    Meniscus, lateral, derangement, right    Unilateral primary osteoarthritis, right knee    COPD with acute exacerbation (HCC) 03/09/2016   Type 2 diabetes mellitus (HCC) 02/16/2007   Cocaine abuse (HCC) 02/16/2007   Extrinsic asthma 02/16/2007   HOMELESSNESS, HX OF 02/16/2007    PCP: Celestia Browning  P, NP  REFERRING PROVIDER: Gretta Bertrum ORN, PA-C  REFERRING DIAG: [phone] (ICD-10-CM) - Hx of total knee arthroplasty, left   THERAPY DIAG:  S/P TKR (total knee replacement), left - Plan: PT plan of care cert/re-cert  Impaired functional mobility, balance, gait, and endurance - Plan: PT plan of care cert/re-cert  Muscle weakness (generalized) - Plan: PT plan of care cert/re-cert  Difficulty in walking, not elsewhere classified - Plan: PT plan of care cert/re-cert  Rationale for Evaluation and Treatment: Rehabilitation  ONSET DATE: 06/19/24  SUBJECTIVE:   SUBJECTIVE STATEMENT: Had  surgery per Dr. Vernetta back 06/20/23 but had to stop therapy due having 3 brothers pass away; all in the month of December.  Arrives with walker; flat effect; very soft spoken; thinks she went to rehab briefly after surgery and then came to outpatient a couple of visits  PERTINENT HISTORY: DM, sees pain management, COPD, CVA; taking Ozempic  PAIN:  Are you having pain? Yes: NPRS scale: 0-10/10 Pain location: left knee Pain description: stiff Aggravating factors: keep in one position too long Relieving factors: movement  PRECAUTIONS: Fall  RED FLAGS: None   WEIGHT BEARING RESTRICTIONS: No  FALLS:  Has patient fallen in last 6 months? Yes. Number of falls 7   PATIENT GOALS: get back to normal and move my left knee so I can have my right knee done  NEXT MD VISIT: PRN  OBJECTIVE:  Note: Objective measures were completed at Evaluation unless otherwise noted.  DIAGNOSTIC FINDINGS:  CLINICAL DATA:  Status post left knee replacement   EXAM: LEFT KNEE - 2 VIEW   COMPARISON:  02/23/2023   FINDINGS: Left knee replacement is noted in satisfactory position. No acute soft tissue abnormality is seen.   IMPRESSION: Status post left knee replacement.   PATIENT SURVEYS:   LEFS: next visit   COGNITION: Overall cognitive status: Within functional limits for tasks assessed But quite lethargic during eval    SENSATION: Appears WFL  POSTURE: rounded shoulders, forward head, and flexed trunk   PALPATION: General sorenes  LOWER EXTREMITY ROM:  Active ROM Right eval Left eval  Hip flexion    Hip extension    Hip abduction    Hip adduction    Hip internal rotation    Hip external rotation    Knee flexion  110  Knee extension -5 -12  Ankle dorsiflexion    Ankle plantarflexion    Ankle inversion    Ankle eversion     (Blank rows = not tested)  LOWER EXTREMITY MMT:  MMT Right eval Left eval  Hip flexion 4 4-  Hip extension    Hip abduction    Hip adduction     Hip internal rotation    Hip external rotation    Knee flexion    Knee extension 4+ 4  Ankle dorsiflexion 5 4  Ankle plantarflexion    Ankle inversion    Ankle eversion     (Blank rows = not tested)  FUNCTIONAL TESTS:  30 sec sit to stand x 2  2 MWT 77 ft  GAIT: Distance walked: 50 ft in clinic  Assistive device utilized: Walker - 2 wheeled Level of assistance: SBA Comments: slow shuffling gait pattern  TREATMENT DATE: 01/09/24    PATIENT EDUCATION:  Education details: PT Evaluation, findings, prognosis, frequency, attendance policy, and what to expect next visit. Person educated: Patient Education method: Medical illustrator Education comprehension: verbalized understanding  HOME EXERCISE PROGRAM: Access Code: [email or web address] URL: [email or web address] Date: 01/09/2024 Prepared by: [facility]  Exercises - Supine Quadricep Sets  - 1 x daily - 7 x weekly - 3 sets - 15 reps - Supine Hip Abduction  - 1 x daily - 7 x weekly - 3 sets - 15 reps - Supine Active Straight Leg Raise  - 1 x daily - 7 x weekly - 3 sets - 15 reps - Seated Long Arc Quad  - 1 x daily - 7 x weekly - 3 sets - 15 reps - Seated Knee Flexion Stretch  - 1 x daily - 7 x weekly - 3 sets - 15 reps  ASSESSMENT:  CLINICAL IMPRESSION: Patient is a 68 y.o. female who was seen today for physical therapy evaluation and treatment for Z96.652 (ICD-10-CM) - Hx of total knee arthroplasty, left. Patient has not attended therapy due to losing several of her family members just after surgery and she states she is depressed.  Discussed with her seeking out counseling/ mental health counseling and she dismisses this idea. Patient demonstrates muscle weakness, reduced ROM, and fascial restrictions which are likely contributing to symptoms of pain and are negatively impacting patient  ability to perform ADLs and functional mobility tasks. Patient will benefit from skilled physical therapy services to address these deficits to reduce pain and improve level of function with ADLs and functional mobility tasks.  Pt will benefit from skilled Physical Therapy services to address deficits/limitations in order to improve functional and QOL.    OBJECTIVE IMPAIRMENTS: Abnormal gait, decreased activity tolerance, difficulty walking, decreased ROM, decreased strength, increased fascial restrictions, impaired perceived functional ability, and pain.   ACTIVITY LIMITATIONS: carrying, lifting, bending, standing, squatting, sleeping, transfers, bed mobility, and locomotion level  PARTICIPATION LIMITATIONS: meal prep, cleaning, laundry, shopping, and community activity  PERSONAL FACTORS: Time since onset of injury/illness/exacerbation are also affecting patient's functional outcome.   REHAB POTENTIAL: Good  CLINICAL DECISION MAKING: Evolving/moderate complexity  EVALUATION COMPLEXITY: Moderate   GOALS: Goals reviewed with patient? No  SHORT TERM GOALS: Target date: 01/30/2024  Pt will be independent with HEP in order to demonstrate participation in Physical Therapy POC.  Baseline: Goal status: INITIAL  2.  Pt will 3/10 pain during mobility in order to demonstrate improved pain while performing ADLs.  Baseline:  Goal status: INITIAL  LONG TERM GOALS: Target date: 02/20/2024  Pt will improve 30 Second Chair Stand Test by 2 in order to demonstrate improved functional strength to return to desired activities.  Baseline: see objective. 2 x at eval Goal status: INITIAL  2.  Pt will improve 2 MWT by 100 ft in order to demonstrate improved functional ambulatory capacity in community setting.  Baseline: see objective.  Goal status: INITIAL  3.  Pt will improve LEFS score by 10 points in order to demonstrate improved pain with functional goals and outcomes. Baseline: see objective.  (Next visit) Goal status: INITIAL  4.  Patient will increase left knee mobility to -2 to 120 to promote normal navigation of steps; step over step pattern  Baseline: see objective.  Goal status: INITIAL  5.   Patient will increase left leg MMT's to 5/5 to allow navigation of steps without gait deviation or loss of balance  Baseline: see objective.  Goal status: INITIAL   PLAN:  PT FREQUENCY: 2x/week  PT DURATION: 6 weeks  PLANNED INTERVENTIONS: 97164- PT Re-evaluation, 97110-Therapeutic exercises, 97530- Therapeutic activity, [id number]- Neuromuscular re-education, 97535- Self Care, 02859- Manual therapy, [phone]- Gait training, [phone]- Orthotic Fit/training, [phone]- Canalith repositioning, [id number]- Aquatic Therapy, [phone]- Splinting, [phone]- Wound care (first 20 sq cm), 97598- Wound care (each additional 20 sq cm)Patient/Family education, Balance training, Stair training, Taping, Dry Needling, Joint mobilization, Joint manipulation, Spinal manipulation, Spinal mobilization, Scar mobilization, and DME instructions.   PLAN FOR NEXT SESSION: needs afternoon appointments; patient quite lethargic this morning and states her morning meds make her sleepy.  LEFS next visit; left knee mobility and strength; gait training and balance   9:29 AM, 01/09/24 Terrion Gencarelli Small Shakeem Stern MPT [employer] [facility] [place] [phone] Ph:[phone]

## 2024-01-09 ENCOUNTER — Other Ambulatory Visit: Payer: Self-pay

## 2024-01-09 ENCOUNTER — Ambulatory Visit (HOSPITAL_COMMUNITY): Attending: Physician Assistant

## 2024-01-09 DIAGNOSIS — Z7409 Other reduced mobility: Secondary | ICD-10-CM | POA: Insufficient documentation

## 2024-01-09 DIAGNOSIS — R262 Difficulty in walking, not elsewhere classified: Secondary | ICD-10-CM | POA: Insufficient documentation

## 2024-01-09 DIAGNOSIS — Z96652 Presence of left artificial knee joint: Secondary | ICD-10-CM | POA: Insufficient documentation

## 2024-01-09 DIAGNOSIS — M6281 Muscle weakness (generalized): Secondary | ICD-10-CM | POA: Diagnosis not present

## 2024-01-13 DIAGNOSIS — M6281 Muscle weakness (generalized): Secondary | ICD-10-CM | POA: Diagnosis not present

## 2024-01-17 DIAGNOSIS — Z7901 Long term (current) use of anticoagulants: Secondary | ICD-10-CM | POA: Diagnosis not present

## 2024-01-17 DIAGNOSIS — J449 Chronic obstructive pulmonary disease, unspecified: Secondary | ICD-10-CM | POA: Diagnosis not present

## 2024-01-17 DIAGNOSIS — Z794 Long term (current) use of insulin: Secondary | ICD-10-CM | POA: Diagnosis not present

## 2024-01-17 DIAGNOSIS — R9431 Abnormal electrocardiogram [ECG] [EKG]: Secondary | ICD-10-CM | POA: Diagnosis not present

## 2024-01-17 DIAGNOSIS — Z743 Need for continuous supervision: Secondary | ICD-10-CM | POA: Diagnosis not present

## 2024-01-17 DIAGNOSIS — E162 Hypoglycemia, unspecified: Secondary | ICD-10-CM | POA: Diagnosis not present

## 2024-01-17 DIAGNOSIS — N3000 Acute cystitis without hematuria: Secondary | ICD-10-CM | POA: Diagnosis not present

## 2024-01-17 DIAGNOSIS — G8929 Other chronic pain: Secondary | ICD-10-CM | POA: Diagnosis not present

## 2024-01-17 DIAGNOSIS — I10 Essential (primary) hypertension: Secondary | ICD-10-CM | POA: Diagnosis not present

## 2024-01-17 DIAGNOSIS — F17210 Nicotine dependence, cigarettes, uncomplicated: Secondary | ICD-10-CM | POA: Diagnosis not present

## 2024-01-17 DIAGNOSIS — I499 Cardiac arrhythmia, unspecified: Secondary | ICD-10-CM | POA: Diagnosis not present

## 2024-01-17 DIAGNOSIS — R404 Transient alteration of awareness: Secondary | ICD-10-CM | POA: Diagnosis not present

## 2024-01-17 DIAGNOSIS — T50904A Poisoning by unspecified drugs, medicaments and biological substances, undetermined, initial encounter: Secondary | ICD-10-CM | POA: Diagnosis not present

## 2024-01-17 DIAGNOSIS — I4892 Unspecified atrial flutter: Secondary | ICD-10-CM | POA: Diagnosis not present

## 2024-01-17 DIAGNOSIS — I69320 Aphasia following cerebral infarction: Secondary | ICD-10-CM | POA: Diagnosis not present

## 2024-01-17 DIAGNOSIS — I480 Paroxysmal atrial fibrillation: Secondary | ICD-10-CM | POA: Diagnosis not present

## 2024-01-17 DIAGNOSIS — D649 Anemia, unspecified: Secondary | ICD-10-CM | POA: Diagnosis not present

## 2024-01-17 DIAGNOSIS — I6782 Cerebral ischemia: Secondary | ICD-10-CM | POA: Diagnosis not present

## 2024-01-17 DIAGNOSIS — Z7984 Long term (current) use of oral hypoglycemic drugs: Secondary | ICD-10-CM | POA: Diagnosis not present

## 2024-01-17 DIAGNOSIS — E11649 Type 2 diabetes mellitus with hypoglycemia without coma: Secondary | ICD-10-CM | POA: Diagnosis not present

## 2024-01-17 DIAGNOSIS — E785 Hyperlipidemia, unspecified: Secondary | ICD-10-CM | POA: Diagnosis not present

## 2024-01-18 ENCOUNTER — Telehealth: Payer: Self-pay | Admitting: Emergency Medicine

## 2024-01-18 NOTE — Telephone Encounter (Signed)
 error

## 2024-01-19 NOTE — Telephone Encounter (Signed)
 Noted

## 2024-01-22 ENCOUNTER — Telehealth: Payer: Self-pay | Admitting: *Deleted

## 2024-01-22 NOTE — Transitions of Care (Post Inpatient/ED Visit) (Signed)
   01/22/2024  Name: Rhonda Hickman MRN: [id number] DOB: 12-05-1955  Today's TOC FU Call Status: Today's TOC FU Call Status:: Unsuccessful Call (1st Attempt) Unsuccessful Call (1st Attempt) Date: 01/22/24  Attempted to reach the patient regarding the most recent Inpatient/ED visit.  Follow Up Plan: Additional outreach attempts will be made to reach the patient to complete the Transitions of Care (Post Inpatient/ED visit) call.   Andrea Dimes RN, BSN [place]  [facility] RN Care Manager [phone]

## 2024-01-23 ENCOUNTER — Telehealth: Payer: Self-pay | Admitting: *Deleted

## 2024-01-23 DIAGNOSIS — F17210 Nicotine dependence, cigarettes, uncomplicated: Secondary | ICD-10-CM | POA: Diagnosis not present

## 2024-01-23 DIAGNOSIS — M5416 Radiculopathy, lumbar region: Secondary | ICD-10-CM | POA: Diagnosis not present

## 2024-01-23 DIAGNOSIS — Z79899 Other long term (current) drug therapy: Secondary | ICD-10-CM | POA: Diagnosis not present

## 2024-01-23 DIAGNOSIS — Z79891 Long term (current) use of opiate analgesic: Secondary | ICD-10-CM | POA: Diagnosis not present

## 2024-01-23 DIAGNOSIS — M25561 Pain in right knee: Secondary | ICD-10-CM | POA: Diagnosis not present

## 2024-01-23 DIAGNOSIS — G8929 Other chronic pain: Secondary | ICD-10-CM | POA: Diagnosis not present

## 2024-01-23 DIAGNOSIS — M25562 Pain in left knee: Secondary | ICD-10-CM | POA: Diagnosis not present

## 2024-01-23 NOTE — Transitions of Care (Post Inpatient/ED Visit) (Signed)
   01/23/2024  Name: Sumire Halbleib MRN: [id number] DOB: [birth year]  Today's TOC FU Call Status: Today's TOC FU Call Status:: Unsuccessful Call (2nd Attempt) Unsuccessful Call (2nd Attempt) Date: 01/23/24  Attempted to reach the patient regarding the most recent Inpatient/ED visit.  Follow Up Plan: Additional outreach attempts will be made to reach the patient to complete the Transitions of Care (Post Inpatient/ED visit) call.   Andrea Dimes RN, BSN [place]  [facility] RN Care Manager [phone]

## 2024-01-24 ENCOUNTER — Telehealth: Payer: Self-pay

## 2024-01-24 NOTE — Patient Instructions (Signed)
 Visit Information  Thank you for taking time to visit with me today. Please don't hesitate to contact me if I can be of assistance to you before our next scheduled telephone appointment.  Our next appointment is by telephone on Andrea Dimes, RN at January 31, 2024 at 1:00 PM  Following is a copy of your care plan:   Goals Addressed             This Visit's Progress    VBCI Transitions of Care (TOC) Care Plan       Problems:  Recent Hospitalization for treatment of AFIB possible TIA Limited social support: ongoing need for possible CAPs or PCS, No Hospital Follow Up Provider appointment request made will need SCAT 5-day , and follow up  Goal:  Over the next 30 days, the patient will not experience hospital readmission  Interventions:   AFIB Interventions:   Counseled on increased risk of stroke due to Afib and benefits of anticoagulation for stroke prevention Reviewed importance of adherence to anticoagulant exactly as prescribed Screening for signs and symptoms of depression related to chronic disease state  Assessed social determinant of health barriers Explained importance of post hospital follow up with PCP  Patient Self Care Activities:  Attend all scheduled provider appointments Call pharmacy for medication refills 3-7 days in advance of running out of medications Notify RN Care Manager of TOC call rescheduling needs Participate in Transition of Care Program/Attend TOC scheduled calls Take medications as prescribed    Plan:  Telephone follow up appointment with care management team member scheduled for:  January 31, 2024 The care management team will reach out to the patient again over the next 5- 7 business days.         The patient has been provided with contact information for the care management team and has been advised to call with any health related questions or concerns.   Please call the care guide team at [phone] if you need to cancel or reschedule your  appointment.   Please call the [place]  National Suicide Prevention Lifeline: (774)825-8944 or TTY: (941)703-6932 TTY (860)672-8135) to talk to a trained counselor call 1-800-273-TALK (toll free, 24 hour hotline) if you are experiencing a Mental Health or Behavioral Health Crisis or need someone to talk to.  Richerd Fish, RN, BSN, CCM [facility], [facility] RN Care Manager Direct Dial : [phone]

## 2024-01-24 NOTE — Transitions of Care (Post Inpatient/ED Visit) (Signed)
 01/24/2024  Name: Rhonda Hickman MRN: [id number] DOB: 1955-09-02  Today's TOC FU Call Status: Today's TOC FU Call Status:: Successful TOC FU Call Completed TOC FU Call Complete Date: 01/24/24 Patient's Name and Date of Birth confirmed.  Transition Care Management Follow-up Telephone Call Date of Discharge: 01/20/24 Discharge Facility: Other [facility]) Name of Other (Non-[facility]) Discharge Facility: [facility] Type of Discharge: Inpatient Admission Primary Inpatient Discharge Diagnosis:: Altered Behavior; UTI How have you been since you were released from the hospital?: Better Any questions or concerns?: Yes Patient Questions/Concerns:: Need CAPS to send package  Items Reviewed: Did you receive and understand the discharge instructions provided?: Yes Medications obtained,verified, and reconciled?: Yes (Medications Reviewed) Reason for Partial Mediation Review: Patient not sure of them all yet Any new allergies since your discharge?: No Dietary orders reviewed?: No Do you have support at home?: Yes Name of Support/Comfort Primary Source: Rhonda Hickman Record  Medications Reviewed Today: Medications Reviewed Today     Reviewed by Eilleen Richerd GRADE, RN (Registered Nurse) on 01/24/24 at 1545  Med List Status: <None>   Medication Order Taking? Sig Documenting Provider Last Dose Status Informant  ACCU-CHEK GUIDE test strip [id number]  CHECK BLOOD SUGAR UP TO FOUR TIMES DAILY AS DIRECTED Celestia Rosaline SQUIBB, NP  Active Self, Pharmacy Records  amLODipine  (NORVASC ) 5 MG tablet [id number] Yes TAKE 1 TABLET BY MOUTH DAILY Celestia Rosaline SQUIBB, NP  Active Self, Pharmacy Records  blood glucose meter kit and supplies [id number]  Dispense based on patient and insurance preference. Use up to four times daily as directed. (FOR ICD-10 E10.9, E11.9). Celestia Rosaline SQUIBB, NP  Active Self, Pharmacy Records  buPROPion  (WELLBUTRIN  SR) 150 MG 12 hr tablet [id number]  TAKE ONE TABLET BY  MOUTH TWICE DAILY @ 9AM & 9PM Celestia Rosaline SQUIBB, NP  Active   divalproex  (DEPAKOTE  ER) 500 MG 24 hr tablet [id number]  Take 500-1,000 mg by mouth See admin instructions. Take 500 mg in the morning and 1000 mg at bedtime [provider]  Active Self, Pharmacy Records  FEROSUL 325 (65 Fe) MG tablet [id number] Yes TAKE ONE TABLET BY MOUTH DAILY AT 9AM WITH BREAKFAST Celestia Rosaline SQUIBB, NP  Active Self, Pharmacy Records  gabapentin  (NEURONTIN ) 100 MG capsule [id number]  TAKE 1 CAPSULE 3 TIMES A DAY  Patient not taking: Reported on 01/24/2024   Gretta Bertrum ORN, PA-C  Active   HYDROcodone -acetaminophen  (NORCO) 10-325 MG tablet [id number]  Take 1 tablet by mouth every 6 (six) hours as needed for moderate pain (pain score 4-6). [provider]  Active Self, Pharmacy Records  insulin  glargine (LANTUS  SOLOSTAR) 100 UNIT/ML Solostar Pen [id number] Yes Inject 24 Units into the skin 2 (two) times daily.  Patient taking differently: Inject 24 Units into the skin 2 (two) times daily.   Celestia Rosaline SQUIBB, NP  Active   insulin  lispro (HUMALOG ) 100 UNIT/ML KwikPen [id number]  Inject 20 Units into the skin 2 (two) times daily.  Patient taking differently: Inject 0-12 Units into the skin 2 (two) times daily as needed (If blood sugar is over 150). Sliding scale 0-150= None 151-200=2 201-250=4 251-300=6 301-350=8 351-400=10 401=12 units and contact doctor   Celestia Rosaline SQUIBB, NP  Active Self, Pharmacy Records           Med Note ANGUS MAXWELL   Thu Jun 29, 2023 11:54 AM) Patient states she uses short acting insulin  when she needs it up to ~10 units. No fill history.  Insulin  Pen Needle (  GNP ULTICARE PEN NEEDLES) 32G X 4 MM MISC [id number]  USE TO INJECT INSULIN  UP TO 5 TIMES A DAY Edwards, Michelle P, NP  Active   Insulin  Syringe-Needle U-100 (ADVOCATE INSULIN  SYRINGE) 29G X 1/2 0.3 ML MISC [id number]  Use to inject insulin  5x daily. Celestia Rosaline SQUIBB, NP  Active Self, Pharmacy Records   lidocaine  (LIDODERM ) 5 % [id number]  Place 1 patch onto the skin daily. Remove & Discard patch within 12 hours or as directed by MD  Patient not taking: Reported on 01/24/2024   Bero, Michael M, MD  Active   metFORMIN  (GLUCOPHAGE ) 1000 MG tablet [id number] Yes Take 1 tablet (1,000 mg total) by mouth 2 (two) times daily. TAKE ONE TABLET BY MOUTH TWICE DAILY @ 9AM & 5PM WITH MEALS Celestia Rosaline SQUIBB, NP  Active   methocarbamol  (ROBAXIN ) 500 MG tablet [id number]  Take 1 tablet (500 mg total) by mouth every 8 (eight) hours as needed for muscle spasms.  Patient not taking: Reported on 01/24/2024   Bero, Michael M, MD  Active   metoprolol  tartrate (LOPRESSOR ) 50 MG tablet [id number] Yes TAKE ONE TABLET BY MOUTH TWICE DAILY @ 9AM & 5PM Kate Lonni CROME, MD  Active Self, Pharmacy Records  oxybutynin  (DITROPAN ) 5 MG tablet [id number] Yes TAKE 1 TABLET BY MOUTH 2 TO 3  TIMES DAILY FOR BLADDER SPASMS Celestia Rosaline SQUIBB, NP  Active   OZEMPIC , 0.25 OR 0.5 MG/DOSE, 2 MG/3ML SOPN [id number] Yes INJECT 0.5MG  ONCE WEEKLY Newlin, Enobong, MD  Active   rosuvastatin  (CRESTOR ) 10 MG tablet [id number] Yes Take 1 tablet (10 mg total) by mouth daily. Kate Lonni CROME, MD  Active Self, Pharmacy Records  SYMBICORT  160-4.5 MCG/ACT inhaler [id number]  INHALE TWO PUFFS BY MOUTH INTO LUNGS TWICE DAILY  Patient taking differently: Inhale 2 puffs into the lungs 2 (two) times daily as needed (shortness of breath).   Celestia Rosaline SQUIBB, NP  Active Self, Pharmacy Records           Med Note ANGUS MAXWELL   Thu Jun 29, 2023 11:51 AM) Unknown last dose.  XARELTO  20 MG TABS tablet [id number] Yes TAKE ONE TABLET BY MOUTH DAILY AT 5PM WITH SUPPER Celestia Rosaline SQUIBB, NP  Active Self, Pharmacy Records           Med Note ANGUS MAXWELL   Thu Jun 29, 2023 11:52 AM) Patient says she takes it at night, will not confirm a more precise time.            Home Care and Equipment/Supplies: Were Home Health Services Ordered?:  No Any new equipment or medical supplies ordered?: No  Functional Questionnaire: Do you need assistance with bathing/showering or dressing?: Yes Do you need assistance with meal preparation?: No Do you need assistance with eating?: No Do you have difficulty maintaining continence: Yes (sometimes - wears depends) Do you need assistance with getting out of bed/getting out of a chair/moving?: Yes  Follow up appointments reviewed: PCP Follow-up appointment confirmed?: No (Patient states she needs [facility]AT 5 days before agreeing and , then agrees to try to get an appointment assistance) Specialist Hospital Follow-up appointment confirmed?: NA Do you need transportation to your follow-up appointment?: No (States, NO, but states uses [facility] and need 5 day notice) Do you understand care options if your condition(s) worsen?: Yes-patient verbalized understanding  SDOH Interventions Today    Flowsheet Row Most Recent Value  SDOH Interventions   Food Insecurity Interventions Intervention Not Indicated  Housing Interventions Intervention Not Indicated  Transportation Interventions Intervention Not Indicated  [RCATS used with Medicaid - need 5 days]  Utilities Interventions Intervention Not Indicated    Goals Addressed             This Visit's Progress    VBCI Transitions of Care (TOC) Care Plan       Problems:  Recent Hospitalization for treatment of AFIB possible TIA Limited social support: ongoing need for possible CAPs or PCS, No Hospital Follow Up Provider appointment request made will need SCAT 5-day , and follow up  Goal:  Over the next 30 days, the patient will not experience hospital readmission  Interventions:   AFIB Interventions:   Counseled on increased risk of stroke due to Afib and benefits of anticoagulation for stroke prevention Reviewed importance of adherence to anticoagulant exactly as prescribed Screening for signs and symptoms of depression related to chronic disease  state  Assessed social determinant of health barriers Explained importance of post hospital follow up with PCP  Patient Self Care Activities:  Attend all scheduled provider appointments Call pharmacy for medication refills 3-7 days in advance of running out of medications Notify RN Care Manager of TOC call rescheduling needs Participate in Transition of Care Program/Attend TOC scheduled calls Take medications as prescribed    Plan:  Telephone follow up appointment with care management team member scheduled for:  January 31, 2024 The care management team will reach out to the patient again over the next 5- 7 business days.         Richerd Fish, RN, BSN, CCM [facility], [facility] Health RN Care Manager Direct Dial : [phone]

## 2024-01-25 ENCOUNTER — Telehealth: Payer: Self-pay | Admitting: *Deleted

## 2024-01-25 NOTE — Telephone Encounter (Signed)
 Noted

## 2024-01-25 NOTE — Progress Notes (Signed)
 Complex Care Management Care Guide Note  01/25/2024 Name: Lakeysha Slutsky MRN: [id number] DOB: 20-Jan-1956  Rhonda Hickman is a 68 y.o. year old female who is a primary care patient of Celestia Rosaline SQUIBB, NP and is actively engaged with the care management team. I reached out to Kyra Borer by phone today to assist with provider appointment scheduling  with the Celestia Rosaline SQUIBB, NP for hospital fu.  Follow up plan: A telephone outreach attempt made.Patient was provided contact information and requested a return call. Harlene Satterfield  [facility]  Value-Based Care Institute, [place] [facility] Guide  Direct Dial : [phone]  Fax [phone]

## 2024-01-29 DIAGNOSIS — Z79899 Other long term (current) drug therapy: Secondary | ICD-10-CM | POA: Diagnosis not present

## 2024-01-29 NOTE — Progress Notes (Signed)
 Complex Care Management Care Guide Note  01/29/2024 Name: Rhonda Hickman MRN: [id number] DOB: 04-Apr-1956  Rhonda Hickman is a 68 y.o. year old female who is a primary care patient of Celestia Rosaline SQUIBB, NP and is actively engaged with the care management team. I reached out to Kyra Borer by phone today to assist with provider appointment scheduling  with the Celestia Rosaline SQUIBB, NP.  Follow up plan: Appointment scheduled 02/12/24 at 10:30 AM   [facility]  [facility], [facility] [place] Guide  Direct Dial : [phone]  Fax [phone]

## 2024-01-31 ENCOUNTER — Encounter: Payer: Self-pay | Admitting: *Deleted

## 2024-01-31 ENCOUNTER — Other Ambulatory Visit: Payer: Self-pay | Admitting: *Deleted

## 2024-01-31 NOTE — Transitions of Care (Post Inpatient/ED Visit) (Signed)
  Transition of Care week 2  Visit Note  01/31/2024  Name: Rhonda Hickman MRN: [id number]          DOB: Jan 30, 1956  Situation: Patient enrolled in [facility] 30-day program. Visit completed with Ms. Rought by telephone.   Background:   Initial Transition Care Management Follow-up Telephone Call    Past Medical History:  Diagnosis Date   Anemia    Anxiety    Arthritis    Asthma    Atrial fibrillation (HCC)    Bipolar 1 disorder (HCC)    Bulging lumbar disc    Chronic pain of left knee    COPD (chronic obstructive pulmonary disease) (HCC)    CVA (cerebral vascular accident) (HCC)    Diabetes mellitus    GERD (gastroesophageal reflux disease)    Neuropathy    TIA (transient ischemic attack)    Vertigo     Assessment: Patient Reported Symptoms: Cognitive Cognitive Status: Able to follow simple commands, Alert and oriented to person, place, and time, Normal speech and language skills, Struggling with memory recall      Neurological Neurological Review of Symptoms: Not assessed    HEENT HEENT Symptoms Reported: Not assessed      Cardiovascular Cardiovascular Symptoms Reported: Not assessed    Respiratory Respiratory Symptoms Reported: Not assesed    Endocrine Endocrine Symptoms Reported: No symptoms reported Is patient diabetic?: Yes Is patient checking blood sugars at home?: Yes (checking irregularly) List most recent blood sugar readings, include date and time of day: 2 days ago BS was 118 Endocrine Self-Management Outcome: 4 (good) Endocrine Comment: Recent A1C was 5.9  Gastrointestinal Gastrointestinal Symptoms Reported: Not assessed      Genitourinary Genitourinary Symptoms Reported: Not assessed    Integumentary Integumentary Symptoms Reported: Not assessed    Musculoskeletal Musculoskelatal Symptoms Reviewed: Difficulty walking Additional Musculoskeletal Details: fell today Musculoskeletal Self-Management Outcome: 3 (uncertain) Falls in the past year?:  Yes Number of falls in past year: 2 or more Was there an injury with Fall?: No Fall Risk Category Calculator: 2 Patient Fall Risk Level: Moderate Fall Risk Patient at Risk for Falls Due to: History of fall(s), Impaired mobility Fall risk Follow up: Falls evaluation completed, Falls prevention discussed  Psychosocial Psychosocial Symptoms Reported: Depression - if selected complete PHQ 2-9         There were no vitals filed for this visit.  Medications Reviewed Today   Medications were not reviewed in this encounter    RNCM unable to review medications or complete assessment. Patient became frustrated during telephone call and requested RNCM call her back tomorrow. A new telephone outreach was scheduled on 02/01/24 at 9:15am. Patient agreed to new date and time.  Recommendation:   Continue Current Plan of Care  Follow Up Plan:   Telephone follow-up in 1 day  Andrea Dimes RN, BSN [place], [place]  Value-Based Care Institute [facility] RN Care Manager [phone]

## 2024-02-01 ENCOUNTER — Other Ambulatory Visit: Admitting: *Deleted

## 2024-02-01 ENCOUNTER — Telehealth: Payer: Self-pay | Admitting: *Deleted

## 2024-02-01 NOTE — Patient Instructions (Signed)
 Visit Information  Thank you for taking time to visit with me today. Please don't hesitate to contact me if I can be of assistance to you before our next scheduled telephone appointment.   Following is a copy of your care plan:   Goals Addressed             This Visit's Progress    VBCI Transitions of Care (TOC) Care Plan       Problems:  Recent Hospitalization for treatment of AFIB possible TIA Limited social support: ongoing need for possible CAPs or PCS, No Hospital Follow Up Provider appointment request made will need SCAT 5-day , and follow up  Goal:  Over the next 30 days, the patient will not experience hospital readmission  Interventions:  Contacted [facility] and spoke with Mr. Rhonda Hickman regarding update on PCS-due to staffing issue, this agency is unable to provide services to [name] [employer] [phone] for assistance with locating another agency to provide PCS-will mail list of agencies, [name] is to select agency and notify [employer] of selection Medication review, discussed Lantus  is currently on hold Advised patient to check fasting BS daily, record and take to PCP visit  AFIB Interventions:  Screening for signs and symptoms of depression related to chronic disease state  Reviewed PCP hospital follow up visit scheduled on 02/12/24, discussed the importance of attending Reviewed PT scheduled on Tuesdays and Thursdays weekly starting 02/06/24, verified patient has transportation arranged  Patient Self Care Activities:  Attend all scheduled provider appointments Call pharmacy for medication refills 3-7 days in advance of running out of medications Notify RN Care Manager of TOC call rescheduling needs Participate in Transition of Care Program/Attend TOC scheduled calls Take medications as prescribed    Plan:  Telephone follow up appointment with care management team member scheduled for:  02/07/24 at 2:30pm        The patient  verbalized understanding of instructions, educational materials, and care plan provided today and agreed to receive a mailed copy of patient instructions, educational materials, and care plan.   Telephone follow up appointment with care management team member scheduled for:02/07/24 at 2:30pm  Please call the care guide team at [phone] if you need to cancel or reschedule your appointment.   Please call 1-800-273-TALK (toll free, 24 hour hotline) call the [facility]: [phone] call 911 if you are experiencing a Mental Health or Behavioral Health Crisis or need someone to talk to.  Rhonda Dimes RN, Rhonda Hickman [place]  Value-Based Care Institute [facility] Health RN Care Manager [phone]

## 2024-02-01 NOTE — Transitions of Care (Post Inpatient/ED Visit) (Signed)
   02/01/2024  Name: Rhonda Hickman MRN: [id number] DOB: 12/04/1955  Opened in error  Andrea Dimes RN, BSN [place]  Value-Based Care Institute [facility] Health RN Care Manager [phone]

## 2024-02-01 NOTE — Transitions of Care (Post Inpatient/ED Visit) (Signed)
 Transition of Care week 2  Visit Note  02/01/2024  Name: Rhonda Hickman MRN: [id number]          DOB: 02/17/1956  Situation: Patient enrolled in [facility] 30-day program. Visit completed with [name] by telephone.   Background:   Initial Transition Care Management Follow-up Telephone Call    Past Medical History:  Diagnosis Date   Anemia    Anxiety    Arthritis    Asthma    Atrial fibrillation (HCC)    Bipolar 1 disorder (HCC)    Bulging lumbar disc    Chronic pain of left knee    COPD (chronic obstructive pulmonary disease) (HCC)    CVA (cerebral vascular accident) (HCC)    Diabetes mellitus    GERD (gastroesophageal reflux disease)    Neuropathy    TIA (transient ischemic attack)    Vertigo     Assessment: Patient Reported Symptoms: Cognitive Cognitive Status: Able to follow simple commands, Alert and oriented to person, place, and time, Struggling with memory recall   Healing Pattern: Average  Neurological Neurological Review of Symptoms: Headaches Neurological Management Strategies: Routine screening Neurological Self-Management Outcome: 3 (uncertain) Neurological Comment: Patient unable to report the frequency of headaches. Headaches improve with sleep.  HEENT HEENT Symptoms Reported: Not assessed      Cardiovascular Cardiovascular Symptoms Reported: No symptoms reported    Respiratory Respiratory Symptoms Reported: No symptoms reported    Endocrine Endocrine Symptoms Reported: No symptoms reported Is patient diabetic?: Yes Is patient checking blood sugars at home?: Yes List most recent blood sugar readings, include date and time of day: Checking BS irregularly, BS was 118 3 days ago Endocrine Self-Management Outcome: 4 (good) Endocrine Comment: Recent A1C was 5.9  Gastrointestinal Gastrointestinal Symptoms Reported: No symptoms reported      Genitourinary Genitourinary Symptoms Reported: No symptoms reported Genitourinary Self-Management Outcome: 4  (good) Genitourinary Comment: Patient reports completing antibiotics for UTI  Integumentary Integumentary Symptoms Reported: No symptoms reported    Musculoskeletal Musculoskelatal Symptoms Reviewed: Difficulty walking Additional Musculoskeletal Details: hx left knee replacement, currently in PT as outpatient. Ambulates with walker Musculoskeletal Self-Management Outcome: 3 (uncertain) Musculoskeletal Comment: Patient expresses concern for needed PCS. Patient has not recieved PCS in a couple of weeks due to previous care giver got a new job. She would like to have PCS to help with bathing, dressing and meal prep. Falls in the past year?: Yes Number of falls in past year: 2 or more Was there an injury with Fall?: No Fall Risk Category Calculator: 2 Patient Fall Risk Level: Moderate Fall Risk Patient at Risk for Falls Due to: Impaired mobility Fall risk Follow up: Education provided, Falls evaluation completed, Falls prevention discussed  Psychosocial Psychosocial Symptoms Reported: Not assessed     Quality of Family Relationships: non-existent   There were no vitals filed for this visit.  Medications Reviewed Today     Reviewed by Lucky Andrea LABOR, RN (Registered Nurse) on 02/01/24 at [phone]  Med List Status: <None>   Medication Order Taking? Sig Documenting Provider Last Dose Status Informant  ACCU-CHEK GUIDE test strip [id number] Yes CHECK BLOOD SUGAR UP TO FOUR TIMES DAILY AS DIRECTED Celestia Rosaline SQUIBB, NP  Active Self, Pharmacy Records  amLODipine  (NORVASC ) 5 MG tablet [id number] Yes TAKE 1 TABLET BY MOUTH DAILY Celestia Rosaline SQUIBB, NP  Active Self, Pharmacy Records  blood glucose meter kit and supplies [id number] Yes Dispense based on patient and insurance preference. Use up to four times daily as directed. (FOR  ICD-10 E10.9, E11.9). Celestia Rosaline SQUIBB, NP  Active Self, Pharmacy Records  buPROPion  (WELLBUTRIN  SR) 150 MG 12 hr tablet [id number] Yes TAKE ONE TABLET BY MOUTH TWICE DAILY  @ 9AM & 9PM Celestia Rosaline SQUIBB, NP  Active   divalproex  (DEPAKOTE  ER) 500 MG 24 hr tablet [id number] Yes Take 500-1,000 mg by mouth See admin instructions. Take 500 mg in the morning and 1000 mg at bedtime [provider]  Active Self, Pharmacy Records  FEROSUL 325 (65 Fe) MG tablet [id number] Yes TAKE ONE TABLET BY MOUTH DAILY AT 9AM WITH BREAKFAST Celestia Rosaline SQUIBB, NP  Active Self, Pharmacy Records  gabapentin  (NEURONTIN ) 100 MG capsule [id number]  TAKE 1 CAPSULE 3 TIMES A DAY  Patient not taking: Reported on 02/01/2024   Gretta Bertrum ORN, PA-C  Active   HYDROcodone -acetaminophen  [facility] Fall) 10-325 MG tablet [id number] Yes Take 1 tablet by mouth every 6 (six) hours as needed for moderate pain (pain score 4-6).  Patient taking differently: Take 1 tablet by mouth every 4 (four) hours as needed for moderate pain (pain score 4-6).   [provider]  Active Self, Pharmacy Records  insulin  glargine (LANTUS  SOLOSTAR) 100 UNIT/ML Solostar Pen [id number]  Inject 24 Units into the skin 2 (two) times daily.  Patient not taking: Reported on 02/01/2024   Celestia Rosaline SQUIBB, NP  Active   insulin  lispro (HUMALOG ) 100 UNIT/ML KwikPen [id number]  Inject 20 Units into the skin 2 (two) times daily.  Patient taking differently: Inject 0-12 Units into the skin 2 (two) times daily as needed (If blood sugar is over 150). Sliding scale 0-150= None 151-200=2 201-250=4 251-300=6 301-350=8 351-400=10 401=12 units and contact doctor   Celestia Rosaline SQUIBB, NP  Active Self, Pharmacy Records           Med Note ANGUS MAXWELL   Thu Jun 29, 2023 11:54 AM) Patient states she uses short acting insulin  when she needs it up to ~10 units. No fill history.  Insulin  Pen Needle (GNP ULTICARE PEN NEEDLES) 32G X 4 MM MISC [id number] Yes USE TO INJECT INSULIN  UP TO 5 TIMES A DAY Edwards, Michelle P, NP  Active   Insulin  Syringe-Needle U-100 (ADVOCATE INSULIN  SYRINGE) 29G X 1/2 0.3 ML MISC [id number] Yes Use to inject  insulin  5x daily. Celestia Rosaline SQUIBB, NP  Active Self, Pharmacy Records  lidocaine  (LIDODERM ) 5 % [id number]  Place 1 patch onto the skin daily. Remove & Discard patch within 12 hours or as directed by MD  Patient not taking: Reported on 02/01/2024   Bero, Michael M, MD  Active   metFORMIN  (GLUCOPHAGE ) 1000 MG tablet [id number] Yes Take 1 tablet (1,000 mg total) by mouth 2 (two) times daily. TAKE ONE TABLET BY MOUTH TWICE DAILY @ 9AM & 5PM WITH MEALS Celestia Rosaline SQUIBB, NP  Active   methocarbamol  (ROBAXIN ) 500 MG tablet [id number] Yes Take 1 tablet (500 mg total) by mouth every 8 (eight) hours as needed for muscle spasms. Theadore Ozell HERO, MD  Active   metoprolol  tartrate (LOPRESSOR ) 50 MG tablet [id number] Yes TAKE ONE TABLET BY MOUTH TWICE DAILY @ 9AM & 5PM Kate Lonni CROME, MD  Active Self, Pharmacy Records  oxybutynin  (DITROPAN ) 5 MG tablet [id number] Yes TAKE 1 TABLET BY MOUTH 2 TO 3  TIMES DAILY FOR BLADDER SPASMS Celestia Rosaline SQUIBB, NP  Active   OZEMPIC , 0.25 OR 0.5 MG/DOSE, 2 MG/3ML NELMA [id number] Yes INJECT 0.5MG  ONCE WEEKLY Newlin, Enobong, MD  Active   rosuvastatin  (CRESTOR ) 10 MG tablet  [id number] Yes Take 1 tablet (10 mg total) by mouth daily. Kate Lonni CROME, MD  Active Self, Pharmacy Records  SYMBICORT  160-4.5 MCG/ACT inhaler [id number] Yes INHALE TWO PUFFS BY MOUTH INTO LUNGS TWICE DAILY Celestia Rosaline SQUIBB, NP  Active Self, Pharmacy Records           Med Note ANGUS MAXWELL   Thu Jun 29, 2023 11:51 AM) Unknown last dose.  XARELTO  20 MG TABS tablet [id number] Yes TAKE ONE TABLET BY MOUTH DAILY AT 5PM WITH SUPPER Celestia Rosaline SQUIBB, NP  Active Self, Pharmacy Records           Med Note ANGUS MAXWELL   Thu Jun 29, 2023 11:52 AM) Patient says she takes it at night, will not confirm a more precise time.            Recommendation:   Continue Current Plan of Care  Follow Up Plan:   Telephone follow-up in 1 week  Andrea Dimes RN, BSN [place]  Value-Based Care  Institute [facility] RN Care Manager [phone]

## 2024-02-06 ENCOUNTER — Encounter (HOSPITAL_COMMUNITY): Admitting: Physical Therapy

## 2024-02-06 ENCOUNTER — Telehealth (HOSPITAL_COMMUNITY): Payer: Self-pay | Admitting: Physical Therapy

## 2024-02-06 NOTE — Telephone Encounter (Signed)
 Pt did not show for appt . Called and spoke to pt who states she forgot about her appt and thought it was virtual.  Explained we do not do virtual appt.  Pt given date/time for next appt.  Pt  asked therapist for surgeons number as she intends on calling and making an appt for her other knee as it is bothering her more at this point.    Greig KATHEE Fuse, PTA/CLT [facility] Out[facility] Ph: [phone]

## 2024-02-07 ENCOUNTER — Other Ambulatory Visit: Payer: Self-pay | Admitting: *Deleted

## 2024-02-07 NOTE — Patient Instructions (Signed)
 Visit Information  Thank you for taking time to visit with me today. Please don't hesitate to contact me if I can be of assistance to you before our next scheduled telephone appointment.   Following is a copy of your care plan:   Goals Addressed             This Visit's Progress    VBCI Transitions of Care (TOC) Care Plan       Problems:  Recent Hospitalization for treatment of AFIB possible TIA Limited social support: ongoing need for possible CAPs or PCS, No Hospital Follow Up Provider appointment request made will need SCAT 5-day , and follow up  Goal:  Over the next 30 days, the patient will not experience hospital readmission  Interventions:  Contacted [facility] [phone] for assistance with locating another agency to provide PCS-will mail list of agencies-ensured Ms. Krukowski received list of providers and is contacting provided agencies Medication review, discussed Lantus  is currently on hold-patient will discuss with PCP on 02/12/24 Advised patient to check fasting BS daily, record and take to PCP visit  AFIB Interventions:  Screening for signs and symptoms of depression related to chronic disease state  Reviewed PCP hospital follow up visit scheduled on 02/12/24, discussed the importance of attending Reviewed PT scheduled on 02/08/24-patient planning to attend  Patient Self Care Activities:  Attend all scheduled provider appointments Call pharmacy for medication refills 3-7 days in advance of running out of medications Notify RN Care Manager of TOC call rescheduling needs Participate in Transition of Care Program/Attend TOC scheduled calls Take medications as prescribed    Plan:  Telephone follow up appointment with care management team member scheduled for:  02/15/24 at 3:15pm        The patient verbalized understanding of instructions, educational materials, and care plan provided today and agreed to receive a mailed copy of patient instructions, educational  materials, and care plan.   Telephone follow up appointment with care management team member scheduled for:02/15/24 at 3:15pm  Please call the care guide team at [phone] if you need to cancel or reschedule your appointment.   Please call 1-800-273-TALK (toll free, 24 hour hotline) call the [facility]: [phone] call 911 if you are experiencing a Mental Health or Behavioral Health Crisis or need someone to talk to.  Andrea Dimes RN, BSN [place]  Value-Based Care Institute [facility] RN Care Manager [phone]

## 2024-02-07 NOTE — Transitions of Care (Post Inpatient/ED Visit) (Signed)
 Transition of Care week 3  Visit Note  02/07/2024  Name: Rhonda Hickman MRN: [id number]          DOB: 1955-09-27  Situation: Patient enrolled in [facility] 30-day program. Visit completed with Ms. Teehan by telephone.   Background:   Initial Transition Care Management Follow-up Telephone Call    Past Medical History:  Diagnosis Date   Anemia    Anxiety    Arthritis    Asthma    Atrial fibrillation (HCC)    Bipolar 1 disorder (HCC)    Bulging lumbar disc    Chronic pain of left knee    COPD (chronic obstructive pulmonary disease) (HCC)    CVA (cerebral vascular accident) (HCC)    Diabetes mellitus    GERD (gastroesophageal reflux disease)    Neuropathy    TIA (transient ischemic attack)    Vertigo     Assessment: Patient Reported Symptoms: Cognitive Cognitive Status: Able to follow simple commands, Alert and oriented to person, place, and time      Neurological Neurological Review of Symptoms: No symptoms reported    HEENT HEENT Symptoms Reported: Not assessed      Cardiovascular Cardiovascular Symptoms Reported: No symptoms reported    Respiratory Respiratory Symptoms Reported: No symptoms reported    Endocrine Endocrine Symptoms Reported: Not assessed    Gastrointestinal Gastrointestinal Symptoms Reported: No symptoms reported      Genitourinary Genitourinary Symptoms Reported: Not assessed    Integumentary Integumentary Symptoms Reported: Not assessed    Musculoskeletal Musculoskelatal Symptoms Reviewed: Joint pain Additional Musculoskeletal Details: Patient reports right knee pain Musculoskeletal Self-Management Outcome: 3 (uncertain) Musculoskeletal Comment: Patient scheduled with Orthopedic/Dr. Althia on 03/04/24. Scheduled for Left knee PT on 02/08/24-patient aware and planning to attend      Psychosocial Psychosocial Symptoms Reported: Not assessed         There were no vitals filed for this visit.  Medications Reviewed Today     Reviewed by  Lucky Andrea LABOR, RN (Registered Nurse) on 02/07/24 at 1504  Med List Status: <None>   Medication Order Taking? Sig Documenting Provider Last Dose Status Informant  ACCU-CHEK GUIDE test strip [id number] Yes CHECK BLOOD SUGAR UP TO FOUR TIMES DAILY AS DIRECTED Celestia Rosaline SQUIBB, NP  Active Self, Pharmacy Records  amLODipine  (NORVASC ) 5 MG tablet [id number] Yes TAKE 1 TABLET BY MOUTH DAILY Celestia Rosaline SQUIBB, NP  Active Self, Pharmacy Records  blood glucose meter kit and supplies [id number] Yes Dispense based on patient and insurance preference. Use up to four times daily as directed. (FOR ICD-10 E10.9, E11.9). Celestia Rosaline SQUIBB, NP  Active Self, Pharmacy Records  buPROPion  (WELLBUTRIN  SR) 150 MG 12 hr tablet [id number] Yes TAKE ONE TABLET BY MOUTH TWICE DAILY @ 9AM & 9PM Celestia Rosaline SQUIBB, NP  Active   divalproex  (DEPAKOTE  ER) 500 MG 24 hr tablet [id number] Yes Take 500-1,000 mg by mouth See admin instructions. Take 500 mg in the morning and 1000 mg at bedtime [provider]  Active Self, Pharmacy Records  FEROSUL 325 (65 Fe) MG tablet [id number] Yes TAKE ONE TABLET BY MOUTH DAILY AT 9AM WITH BREAKFAST Celestia Rosaline SQUIBB, NP  Active Self, Pharmacy Records  gabapentin  (NEURONTIN ) 100 MG capsule [id number]  TAKE 1 CAPSULE 3 TIMES A DAY  Patient not taking: Reported on 02/07/2024   Gretta Bertrum ORN, PA-C  Active   HYDROcodone -acetaminophen  (NORCO) 10-325 MG tablet [id number] Yes Take 1 tablet by mouth every 6 (six) hours as needed for moderate pain (pain score  4-6). [provider]  Active Self, Pharmacy Records  ibuprofen  (ADVIL ) 200 MG tablet [id number] Yes Take 400 mg by mouth every 6 (six) hours as needed. [provider]  Active   insulin  glargine (LANTUS  SOLOSTAR) 100 UNIT/ML Solostar Pen [id number]  Inject 24 Units into the skin 2 (two) times daily.  Patient not taking: Reported on 02/07/2024   Celestia Rosaline SQUIBB, NP  Active   insulin  lispro (HUMALOG ) 100 UNIT/ML KwikPen  [id number]  Inject 20 Units into the skin 2 (two) times daily.  Patient taking differently: Inject 0-12 Units into the skin 2 (two) times daily as needed (If blood sugar is over 150). Sliding scale 0-150= None 151-200=2 201-250=4 251-300=6 301-350=8 351-400=10 401=12 units and contact doctor   Celestia Rosaline SQUIBB, NP  Active Self, Pharmacy Records           Med Note ANGUS MAXWELL   Thu Jun 29, 2023 11:54 AM) Patient states she uses short acting insulin  when she needs it up to ~10 units. No fill history.  Insulin  Pen Needle (GNP ULTICARE PEN NEEDLES) 32G X 4 MM MISC [id number] Yes USE TO INJECT INSULIN  UP TO 5 TIMES A DAY Edwards, Michelle P, NP  Active   Insulin  Syringe-Needle U-100 (ADVOCATE INSULIN  SYRINGE) 29G X 1/2 0.3 ML MISC [id number] Yes Use to inject insulin  5x daily. Celestia Rosaline SQUIBB, NP  Active Self, Pharmacy Records  lidocaine  (LIDODERM ) 5 % [id number]  Place 1 patch onto the skin daily. Remove & Discard patch within 12 hours or as directed by MD  Patient not taking: Reported on 02/07/2024   Bero, Michael M, MD  Active   metFORMIN  (GLUCOPHAGE ) 1000 MG tablet [id number] Yes Take 1 tablet (1,000 mg total) by mouth 2 (two) times daily. TAKE ONE TABLET BY MOUTH TWICE DAILY @ 9AM & 5PM WITH MEALS Celestia Rosaline SQUIBB, NP  Active   methocarbamol  (ROBAXIN ) 500 MG tablet [id number]  Take 1 tablet (500 mg total) by mouth every 8 (eight) hours as needed for muscle spasms.  Patient not taking: Reported on 02/07/2024   Bero, Michael M, MD  Active   metoprolol  tartrate (LOPRESSOR ) 50 MG tablet [id number] Yes TAKE ONE TABLET BY MOUTH TWICE DAILY @ 9AM & 5PM Kate Lonni CROME, MD  Active Self, Pharmacy Records  oxybutynin  (DITROPAN ) 5 MG tablet [id number] Yes TAKE 1 TABLET BY MOUTH 2 TO 3  TIMES DAILY FOR BLADDER SPASMS Celestia Rosaline SQUIBB, NP  Active   OZEMPIC , 0.25 OR 0.5 MG/DOSE, 2 MG/3ML NELMA [id number] Yes INJECT 0.5MG  ONCE WEEKLY Newlin, Enobong, MD  Active   rosuvastatin  (CRESTOR ) 10 MG  tablet [id number] Yes Take 1 tablet (10 mg total) by mouth daily. Kate Lonni CROME, MD  Active Self, Pharmacy Records  SYMBICORT  160-4.5 MCG/ACT inhaler [id number] Yes INHALE TWO PUFFS BY MOUTH INTO LUNGS TWICE DAILY Celestia Rosaline SQUIBB, NP  Active Self, Pharmacy Records           Med Note ANGUS MAXWELL   Thu Jun 29, 2023 11:51 AM) Unknown last dose.  XARELTO  20 MG TABS tablet [id number] Yes TAKE ONE TABLET BY MOUTH DAILY AT 5PM WITH SUPPER Celestia Rosaline SQUIBB, NP  Active Self, Pharmacy Records           Med Note ANGUS MAXWELL   Thu Jun 29, 2023 11:52 AM) Patient says she takes it at night, will not confirm a more precise time.            Recommendation:   Continue Current Plan of  Care  Follow Up Plan:   Telephone follow-up in 1 week  Andrea Dimes RN, BSN [place]  Value-Based Care Institute [facility] RN Care Manager [phone]

## 2024-02-08 ENCOUNTER — Encounter (HOSPITAL_COMMUNITY)

## 2024-02-12 ENCOUNTER — Ambulatory Visit: Payer: Self-pay

## 2024-02-12 ENCOUNTER — Inpatient Hospital Stay (INDEPENDENT_AMBULATORY_CARE_PROVIDER_SITE_OTHER): Admitting: Primary Care

## 2024-02-12 ENCOUNTER — Other Ambulatory Visit: Payer: Self-pay | Admitting: Family Medicine

## 2024-02-12 NOTE — Telephone Encounter (Signed)
 Copied from CRM [id number]. Topic: Clinical - Red Word Triage >> Feb 12, 2024 10:56 AM Tobias CROME wrote: Red Word that prompted transfer to Nurse Triage: patient fell earlier today, has knot on her head and is sore. patient needing appointment >> Feb 12, 2024  1:38 PM Mia F wrote: Pt has been scheduled for the next available appt on 8/21. Pt was seen in the hospital on 7/9 and did not schedule a hospital follow up. She says she thought the office would call her to schedule. Pt also fell today. Per notes in chart, pt is to schedule an hospital follow up. No appts within the next 14 days. Please schedule pt sooner if possible.

## 2024-02-12 NOTE — Telephone Encounter (Signed)
 FYI Only or Action Required?: Action required by provider: request for appointment.  Patient was last seen in primary care on 11/02/2023 by Rhonda Rosaline SQUIBB, NP.  Called Nurse Triage reporting Fall.  Symptoms began today.  Interventions attempted: Nothing.  Symptoms are: unchanged.  Triage Disposition: Go to ED Now  Patient/caregiver understands and will follow disposition?: no  Copied from CRM #[id number]. Topic: Clinical - Red Word Triage >> Feb 12, 2024 10:56 AM Rhonda Hickman wrote: Red Word that prompted transfer to Nurse Triage: patient fell earlier today, has knot on her head and is sore. patient needing appointment Answer Assessment - Initial Assessment Questions 1. MECHANISM: How did the fall happen?     Knee gave out, pt fell. + blood thinners.   Pt advised to go to the ED, pt refused. Pt states that she does not want to go to the ED, just wants to reschedule her hosp f/u with PCP.  No appts available within next 14 days, routing to clinic.  Protocols used: Falls and [facility]

## 2024-02-12 NOTE — Telephone Encounter (Signed)
 Please reach out to pt and reschedule hospital f/u

## 2024-02-13 ENCOUNTER — Encounter (HOSPITAL_COMMUNITY): Admitting: Physical Therapy

## 2024-02-14 ENCOUNTER — Other Ambulatory Visit: Payer: Self-pay | Admitting: *Deleted

## 2024-02-14 ENCOUNTER — Encounter: Payer: Self-pay | Admitting: *Deleted

## 2024-02-14 NOTE — Patient Instructions (Signed)
 Visit Information  Thank you for taking time to visit with me today. Please don't hesitate to contact me if I can be of assistance to you before our next scheduled telephone appointment.   Following is a copy of your care plan:   Goals Addressed             This Visit's Progress    VBCI Transitions of Care (TOC) Care Plan       Problems:  Recent Hospitalization for treatment of AFIB possible TIA Limited social support: ongoing need for possible CAPs or PCS, No Hospital Follow Up Provider appointment request made will need SCAT 5-day , and follow up  Goal:  Over the next 30 days, the patient will not experience hospital readmission  Interventions:  Medication review, discussed Lantus  is currently on hold-patient will discuss with PCP on 03/04/24 Advised patient to check fasting BS daily, record and take to PCP visit Discussed progress in finding PCS-Ms. Cunliffe has located 2 agencies that are available to provide services  Fall precautions reviewed  AFIB Interventions:  Reviewed importance of adherence to anticoagulant exactly as prescribed Counseled on bleeding risk associated with Xarelto  and importance of self-monitoring for signs/symptoms of bleeding Counseled on seeking medical attention after a head injury or if there is blood in the urine/stool Reviewed PCP hospital follow up visit rescheduled to 03/04/24, discussed the importance of attending Reviewed PT scheduled 02/15/24-patient does not feel like attending and will call to cancel  Patient Self Care Activities:  Attend all scheduled provider appointments Call pharmacy for medication refills 3-7 days in advance of running out of medications Notify RN Care Manager of TOC call rescheduling needs Participate in Transition of Care Program/Attend TOC scheduled calls Take medications as prescribed    Plan:  Telephone follow up appointment with care management team member scheduled for:  02/21/24 at 3:15pm with [place], RN         The patient verbalized understanding of instructions, educational materials, and care plan provided today and agreed to receive a mailed copy of patient instructions, educational materials, and care plan.   Telephone follow up appointment with care management team member scheduled for:02/21/24 at 3:15pm with [place]  Please call the care guide team at [phone] if you need to cancel or reschedule your appointment.   Please call 1-800-273-TALK (toll free, 24 hour hotline) call the [facility]: [phone] call 911 if you are experiencing a Mental Health or Behavioral Health Crisis or need someone to talk to.  Andrea Dimes RN, BSN [place]  Value-Based Care Institute [facility] RN Care Manager [phone]

## 2024-02-14 NOTE — Transitions of Care (Post Inpatient/ED Visit) (Signed)
 Transition of Care week 4  Visit Note  02/14/2024  Name: Rhonda Hickman MRN: [id number]          DOB: 15-Dec-1955  Situation: Patient enrolled in [facility] 30-day program. Visit completed with Ms. Oliveria by telephone.   Background:   Initial Transition Care Management Follow-up Telephone Call    Past Medical History:  Diagnosis Date   Anemia    Anxiety    Arthritis    Asthma    Atrial fibrillation (HCC)    Bipolar 1 disorder (HCC)    Bulging lumbar disc    Chronic pain of left knee    COPD (chronic obstructive pulmonary disease) (HCC)    CVA (cerebral vascular accident) (HCC)    Diabetes mellitus    GERD (gastroesophageal reflux disease)    Neuropathy    TIA (transient ischemic attack)    Vertigo     Assessment: Patient Reported Symptoms: Cognitive Cognitive Status: Able to follow simple commands, Normal speech and language skills, Alert and oriented to person, place, and time      Neurological Neurological Review of Symptoms: Headaches Neurological Management Strategies: Medication therapy Neurological Self-Management Outcome: 3 (uncertain) Neurological Comment: Patient reports headache that is relieved with ibuprofen .  HEENT        Cardiovascular Cardiovascular Symptoms Reported: No symptoms reported    Respiratory Respiratory Symptoms Reported: No symptoms reported    Endocrine Endocrine Symptoms Reported: Not assessed    Gastrointestinal Gastrointestinal Symptoms Reported: No symptoms reported      Genitourinary Genitourinary Symptoms Reported: No symptoms reported    Integumentary Integumentary Symptoms Reported: No symptoms reported    Musculoskeletal   Musculoskeletal Management Strategies: Medical device Musculoskeletal Self-Management Outcome: 3 (uncertain) Musculoskeletal Comment: Patient reports falling 2 days ago. Denies any obvious injury, declines evaluation. Reports muscle soreness and a bump on her forehead, does not remember hitting her head.  Patient scheduled for PT, but she would prefer [facility] PT. Falls in the past year?: Yes Number of falls in past year: 2 or more Was there an injury with Fall?: No Fall Risk Category Calculator: 2 Patient Fall Risk Level: Moderate Fall Risk Patient at Risk for Falls Due to: History of fall(s) Fall risk Follow up: Falls evaluation completed, Education provided, Falls prevention discussed  Psychosocial Psychosocial Symptoms Reported: Not assessed         There were no vitals filed for this visit.  Medications Reviewed Today     Reviewed by Lucky Andrea LABOR, RN (Registered Nurse) on 02/14/24 at 1601  Med List Status: <None>   Medication Order Taking? Sig Documenting Provider Last Dose Status Informant  ACCU-CHEK GUIDE test strip [id number] Yes CHECK BLOOD SUGAR UP TO FOUR TIMES DAILY AS DIRECTED Celestia Rosaline SQUIBB, NP  Active Self, Pharmacy Records  amLODipine  (NORVASC ) 5 MG tablet [id number] Yes TAKE 1 TABLET BY MOUTH DAILY Celestia Rosaline SQUIBB, NP  Active Self, Pharmacy Records  blood glucose meter kit and supplies [id number] Yes Dispense based on patient and insurance preference. Use up to four times daily as directed. (FOR ICD-10 E10.9, E11.9). Celestia Rosaline SQUIBB, NP  Active Self, Pharmacy Records  buPROPion  (WELLBUTRIN  SR) 150 MG 12 hr tablet [id number] Yes TAKE ONE TABLET BY MOUTH TWICE DAILY @ 9AM & 9PM Celestia Rosaline SQUIBB, NP  Active   divalproex  (DEPAKOTE  ER) 500 MG 24 hr tablet [id number] Yes Take 500-1,000 mg by mouth See admin instructions. Take 500 mg in the morning and 1000 mg at bedtime [provider]  Active Self, Pharmacy  Records  FEROSUL 325 (65 Fe) MG tablet [id number] Yes TAKE ONE TABLET BY MOUTH DAILY AT 9AM WITH BREAKFAST Celestia Rosaline SQUIBB, NP  Active Self, Pharmacy Records  gabapentin  (NEURONTIN ) 100 MG capsule [id number]  TAKE 1 CAPSULE 3 TIMES A DAY  Patient not taking: Reported on 02/14/2024   Gretta Bertrum ORN, PA-C  Active   HYDROcodone -acetaminophen  [facility]) 10-325  MG tablet [id number] Yes Take 1 tablet by mouth every 6 (six) hours as needed for moderate pain (pain score 4-6). [provider]  Active Self, Pharmacy Records  ibuprofen  (ADVIL ) 200 MG tablet [id number] Yes Take 400 mg by mouth every 6 (six) hours as needed. [provider]  Active   insulin  glargine (LANTUS  SOLOSTAR) 100 UNIT/ML Solostar Pen [id number]  Inject 24 Units into the skin 2 (two) times daily.  Patient not taking: Reported on 02/14/2024   Celestia Rosaline SQUIBB, NP  Active   insulin  lispro (HUMALOG ) 100 UNIT/ML KwikPen [id number]  Inject 20 Units into the skin 2 (two) times daily.  Patient not taking: Reported on 02/14/2024   Celestia Rosaline SQUIBB, NP  Active Self, Pharmacy Records           Med Note ANGUS MAXWELL   Thu Jun 29, 2023 11:54 AM) Patient states she uses short acting insulin  when she needs it up to ~10 units. No fill history.  Insulin  Pen Needle (GNP ULTICARE PEN NEEDLES) 32G X 4 MM MISC [id number]  USE TO INJECT INSULIN  UP TO 5 TIMES A DAY  Patient not taking: Reported on 02/14/2024   Celestia Rosaline SQUIBB, NP  Active   Insulin  Syringe-Needle U-100 (ADVOCATE INSULIN  SYRINGE) 29G X 1/2 0.3 ML MISC [id number]  Use to inject insulin  5x daily.  Patient not taking: Reported on 02/14/2024   Celestia Rosaline SQUIBB, NP  Active Self, Pharmacy Records  lidocaine  (LIDODERM ) 5 % [id number]  Place 1 patch onto the skin daily. Remove & Discard patch within 12 hours or as directed by MD  Patient not taking: Reported on 02/14/2024   Bero, Michael M, MD  Active   metFORMIN  (GLUCOPHAGE ) 1000 MG tablet [id number] Yes Take 1 tablet (1,000 mg total) by mouth 2 (two) times daily. TAKE ONE TABLET BY MOUTH TWICE DAILY @ 9AM & 5PM WITH MEALS Celestia Rosaline SQUIBB, NP  Active   methocarbamol  (ROBAXIN ) 500 MG tablet [id number]  Take 1 tablet (500 mg total) by mouth every 8 (eight) hours as needed for muscle spasms.  Patient not taking: Reported on 02/14/2024   Bero, Michael M, MD  Active   metoprolol   tartrate (LOPRESSOR ) 50 MG tablet [id number] Yes TAKE ONE TABLET BY MOUTH TWICE DAILY @ 9AM & 5PM Kate Lonni CROME, MD  Active Self, Pharmacy Records  oxybutynin  (DITROPAN ) 5 MG tablet [id number] Yes TAKE 1 TABLET BY MOUTH 2 TO 3  TIMES DAILY FOR BLADDER SPASMS Celestia Rosaline SQUIBB, NP  Active   OZEMPIC , 0.25 OR 0.5 MG/DOSE, 2 MG/3ML SOPN [id number] Yes INJECT 0.5MG  ONCE WEEKLY Newlin, Enobong, MD  Active   rosuvastatin  (CRESTOR ) 10 MG tablet [id number] Yes Take 1 tablet (10 mg total) by mouth daily. Kate Lonni CROME, MD  Active Self, Pharmacy Records  SYMBICORT  160-4.5 MCG/ACT inhaler [id number]  INHALE TWO PUFFS BY MOUTH INTO LUNGS TWICE DAILY Celestia Rosaline SQUIBB, NP  Active Self, Pharmacy Records           Med Note ANGUS MAXWELL   Thu Jun 29, 2023 11:51 AM) Unknown last dose.  XARELTO  20 MG TABS tablet [id number] Yes  TAKE ONE TABLET BY MOUTH DAILY AT 5PM WITH SUPPER Celestia Rosaline SQUIBB, NP  Active Self, Pharmacy Records           Med Note ANGUS MAXWELL   Thu Jun 29, 2023 11:52 AM) Patient says she takes it at night, will not confirm a more precise time.            Recommendation:   Continue Current Plan of Care  Follow Up Plan:   Telephone follow-up in 1 week  Andrea Dimes RN, BSN [place]  [facility] RN Care Manager [phone]

## 2024-02-15 ENCOUNTER — Encounter (HOSPITAL_COMMUNITY): Admitting: Physical Therapy

## 2024-02-20 ENCOUNTER — Encounter (HOSPITAL_COMMUNITY)

## 2024-02-21 ENCOUNTER — Telehealth: Payer: Self-pay

## 2024-02-21 ENCOUNTER — Telehealth (HOSPITAL_COMMUNITY): Payer: Self-pay

## 2024-02-21 ENCOUNTER — Encounter (HOSPITAL_COMMUNITY): Payer: Self-pay

## 2024-02-21 NOTE — Telephone Encounter (Signed)
 Spoke with patient who states she is falling a lot and wants to see her MD before resuming any further therapy. Will discharge for now and she is in agreement.  11:42 AM, 02/21/24 Rhonda Hickman Small [name] MPT [place] [facility] [place] [phone]

## 2024-02-21 NOTE — Therapy (Signed)
 PHYSICAL THERAPY DISCHARGE SUMMARY  Visits from Start of Care: 1  Current functional level related to goals / functional outcomes: unknown   Remaining deficits: unknown   Education / Equipment: HeP   Patient agrees to discharge. Patient goals were not met. Patient is being discharged due to not returning since the last visit. Asked to be discharge due to several falls.  Wants to be seen by her MD before returning to therapy.  11:44 AM, 02/21/24 Rotha Cassels Small Shila Kruczek MPT [place] [facility] [place] [phone]

## 2024-02-22 ENCOUNTER — Encounter (HOSPITAL_COMMUNITY)

## 2024-02-27 ENCOUNTER — Encounter (HOSPITAL_COMMUNITY): Admitting: Physical Therapy

## 2024-02-27 DIAGNOSIS — M6281 Muscle weakness (generalized): Secondary | ICD-10-CM | POA: Diagnosis not present

## 2024-02-28 ENCOUNTER — Telehealth: Payer: Self-pay | Admitting: *Deleted

## 2024-02-28 DIAGNOSIS — Z09 Encounter for follow-up examination after completed treatment for conditions other than malignant neoplasm: Secondary | ICD-10-CM

## 2024-02-28 DIAGNOSIS — Z79891 Long term (current) use of opiate analgesic: Secondary | ICD-10-CM | POA: Diagnosis not present

## 2024-02-28 DIAGNOSIS — G8929 Other chronic pain: Secondary | ICD-10-CM | POA: Diagnosis not present

## 2024-02-28 DIAGNOSIS — F17210 Nicotine dependence, cigarettes, uncomplicated: Secondary | ICD-10-CM | POA: Diagnosis not present

## 2024-02-28 DIAGNOSIS — M5416 Radiculopathy, lumbar region: Secondary | ICD-10-CM | POA: Diagnosis not present

## 2024-02-28 DIAGNOSIS — Z79899 Other long term (current) drug therapy: Secondary | ICD-10-CM | POA: Diagnosis not present

## 2024-02-28 DIAGNOSIS — M25561 Pain in right knee: Secondary | ICD-10-CM | POA: Diagnosis not present

## 2024-02-28 DIAGNOSIS — M25562 Pain in left knee: Secondary | ICD-10-CM | POA: Diagnosis not present

## 2024-02-28 NOTE — Patient Instructions (Signed)
 Visit Information  Thank you for taking time to visit with me today. Please don't hesitate to contact me if I can be of assistance to you before our next scheduled telephone appointment.    Following is a copy of your care plan:   Goals Addressed             This Visit's Progress    COMPLETED: VBCI Transitions of Care (TOC) Care Plan       Problems:  Recent Hospitalization for treatment of AFIB possible TIA Limited social support: ongoing need for possible CAPs or PCS, No Hospital Follow Up Provider appointment request made will need SCAT 5-day , and follow up  Goal:  Over the next 30 days, the patient will not experience hospital readmission  Interventions:  Medication review, discussed Lantus  is currently on hold-patient will discuss with PCP on 03/04/24 Advised patient to check fasting BS daily, record and take to PCP visit Discussed progress in finding PCS-Ms. Thresher is on the wait list for PCS Fall precautions reviewed Reviewed upcoming appointments including: PCP and Orthopedic on 03/04/24 and Cardiology on 03/08/24  AFIB Interventions:  Reviewed importance of adherence to anticoagulant exactly as prescribed Counseled on bleeding risk associated with Xarelto  and importance of self-monitoring for signs/symptoms of bleeding Counseled on seeking medical attention after a head injury or if there is blood in the urine/stool Reviewed PCP hospital follow up visit rescheduled to 03/04/24, discussed the importance of attending Discussed referral to Longitudinal Case Management-patient agreed Referral to Longitudinal Case Management  Patient Self Care Activities:  Attend all scheduled provider appointments Call pharmacy for medication refills 3-7 days in advance of running out of medications Notify RN Care Manager of TOC call rescheduling needs Participate in Transition of Care Program/Attend TOC scheduled calls Take medications as prescribed    Plan:  The care management team  will reach out to the patient again over the next 30 days.        The patient verbalized understanding of instructions, educational materials, and care plan provided today and agreed to receive a mailed copy of patient instructions, educational materials, and care plan.   The care management team will reach out to the patient again over the next 30 days.   Please call the care guide team at [phone] if you need to cancel or reschedule your appointment.   Please call 1-800-273-TALK (toll free, 24 hour hotline) call the [facility]: [phone] call 911 if you are experiencing a Mental Health or Behavioral Health Crisis or need someone to talk to.  Andrea Dimes RN, BSN [place]  [facility] RN Care Manager [phone]

## 2024-02-28 NOTE — Transitions of Care (Post Inpatient/ED Visit) (Signed)
 Transition of Care week 5  Visit Note  02/28/2024  Name: Rhonda Hickman MRN: [id number]          DOB: 03-01-1956  Situation: Patient enrolled in [facility] 30-day program. Visit completed with Rhonda Hickman by telephone.   Background:   Initial Transition Care Management Follow-up Telephone Call    Past Medical History:  Diagnosis Date   Anemia    Anxiety    Arthritis    Asthma    Atrial fibrillation (HCC)    Bipolar 1 disorder (HCC)    Bulging lumbar disc    Chronic pain of left knee    COPD (chronic obstructive pulmonary disease) (HCC)    CVA (cerebral vascular accident) (HCC)    Diabetes mellitus    GERD (gastroesophageal reflux disease)    Neuropathy    TIA (transient ischemic attack)    Vertigo     Assessment: Patient Reported Symptoms: Cognitive Cognitive Status: Able to follow simple commands, Normal speech and language skills, Alert and oriented to person, place, and time      Neurological Neurological Review of Symptoms: No symptoms reported Neurological Self-Management Outcome: 4 (good)  HEENT HEENT Symptoms Reported: No symptoms reported      Cardiovascular Cardiovascular Symptoms Reported: No symptoms reported    Respiratory Respiratory Symptoms Reported: No symptoms reported    Endocrine Endocrine Symptoms Reported: No symptoms reported Is patient diabetic?: Yes Is patient checking blood sugars at home?: Yes List most recent blood sugar readings, include date and time of day: Fasting today 130 Endocrine Self-Management Outcome: 4 (good)  Gastrointestinal Gastrointestinal Symptoms Reported: Not assessed      Genitourinary Genitourinary Symptoms Reported: Not assessed    Integumentary Integumentary Symptoms Reported: Not assessed    Musculoskeletal Musculoskelatal Symptoms Reviewed: Joint pain Additional Musculoskeletal Details: pain in left hip down to knee. Patient is on the way to Pain Management appointment currently Musculoskeletal Management  Strategies: Medical device, Adequate rest Musculoskeletal Self-Management Outcome: 3 (uncertain) Musculoskeletal Comment: Now having left hip pain that radiates down to left knee.      Psychosocial Psychosocial Symptoms Reported: Not assessed         There were no vitals filed for this visit.  Medications Reviewed Today     Reviewed by Lucky Andrea LABOR, RN (Registered Nurse) on 02/28/24 at 1022  Med List Status: <None>   Medication Order Taking? Sig Documenting Provider Last Dose Status Informant  ACCU-CHEK GUIDE test strip [id number] Yes CHECK BLOOD SUGAR UP TO FOUR TIMES DAILY AS DIRECTED Celestia Rosaline SQUIBB, NP  Active Self, Pharmacy Records  amLODipine  (NORVASC ) 5 MG tablet [id number] Yes TAKE 1 TABLET BY MOUTH DAILY Celestia Rosaline SQUIBB, NP  Active Self, Pharmacy Records  blood glucose meter kit and supplies [id number] Yes Dispense based on patient and insurance preference. Use up to four times daily as directed. (FOR ICD-10 E10.9, E11.9). Celestia Rosaline SQUIBB, NP  Active Self, Pharmacy Records  buPROPion  (WELLBUTRIN  SR) 150 MG 12 hr tablet [id number] Yes TAKE ONE TABLET BY MOUTH TWICE DAILY @ 9AM & 9PM Celestia Rosaline SQUIBB, NP  Active   divalproex  (DEPAKOTE  ER) 500 MG 24 hr tablet [id number] Yes Take 500-1,000 mg by mouth See admin instructions. Take 500 mg in the morning and 1000 mg at bedtime [provider]  Active Self, Pharmacy Records  FEROSUL 325 (65 Fe) MG tablet [id number] Yes TAKE ONE TABLET BY MOUTH DAILY AT 9AM WITH BREAKFAST Celestia Rosaline SQUIBB, NP  Active Self, Pharmacy Records  gabapentin  (NEURONTIN ) 100 MG  capsule [id number]  TAKE 1 CAPSULE 3 TIMES A DAY  Patient not taking: Reported on 02/28/2024   Gretta Bertrum ORN, PA-C  Active   HYDROcodone -acetaminophen  [facility]) 10-325 MG tablet [id number] Yes Take 1 tablet by mouth every 6 (six) hours as needed for moderate pain (pain score 4-6). [provider]  Active Self, Pharmacy Records  ibuprofen  (ADVIL ) 200 MG tablet  [id number] Yes Take 400 mg by mouth every 6 (six) hours as needed. [provider]  Active   insulin  glargine (LANTUS  SOLOSTAR) 100 UNIT/ML Solostar Pen [id number]  Inject 24 Units into the skin 2 (two) times daily.  Patient not taking: Reported on 02/28/2024   Celestia Rosaline SQUIBB, NP  Active   insulin  lispro (HUMALOG ) 100 UNIT/ML KwikPen [id number]  Inject 20 Units into the skin 2 (two) times daily.  Patient not taking: Reported on 02/28/2024   Celestia Rosaline SQUIBB, NP  Active Self, Pharmacy Records           Med Note ANGUS MAXWELL   Thu Jun 29, 2023 11:54 AM) Patient states she uses short acting insulin  when she needs it up to ~10 units. No fill history.  Insulin  Pen Needle (GNP ULTICARE PEN NEEDLES) 32G X 4 MM MISC [id number]  USE TO INJECT INSULIN  UP TO 5 TIMES A DAY  Patient not taking: Reported on 02/28/2024   Celestia Rosaline SQUIBB, NP  Active   Insulin  Syringe-Needle U-100 (ADVOCATE INSULIN  SYRINGE) 29G X 1/2 0.3 ML MISC [id number]  Use to inject insulin  5x daily.  Patient not taking: Reported on 02/28/2024   Celestia Rosaline SQUIBB, NP  Active Self, Pharmacy Records  lidocaine  (LIDODERM ) 5 % [id number]  Place 1 patch onto the skin daily. Remove & Discard patch within 12 hours or as directed by MD  Patient not taking: Reported on 02/28/2024   Bero, Michael M, MD  Active   metFORMIN  (GLUCOPHAGE ) 1000 MG tablet [id number] Yes Take 1 tablet (1,000 mg total) by mouth 2 (two) times daily. TAKE ONE TABLET BY MOUTH TWICE DAILY @ 9AM & 5PM WITH MEALS Celestia Rosaline SQUIBB, NP  Active   methocarbamol  (ROBAXIN ) 500 MG tablet [id number]  Take 1 tablet (500 mg total) by mouth every 8 (eight) hours as needed for muscle spasms.  Patient not taking: Reported on 02/28/2024   Bero, Michael M, MD  Active   metoprolol  tartrate (LOPRESSOR ) 50 MG tablet [id number] Yes TAKE ONE TABLET BY MOUTH TWICE DAILY @ 9AM & 5PM Kate Lonni CROME, MD  Active Self, Pharmacy Records  oxybutynin  (DITROPAN ) 5 MG tablet [id number]  Yes TAKE 1 TABLET BY MOUTH 2 TO 3  TIMES DAILY FOR BLADDER SPASMS Celestia Rosaline SQUIBB, NP  Active   OZEMPIC , 0.25 OR 0.5 MG/DOSE, 2 MG/3ML NELMA [id number] Yes INJECT 0.5MG  ONCE WEEKLY Newlin, Enobong, MD  Active   rosuvastatin  (CRESTOR ) 10 MG tablet [id number] Yes Take 1 tablet (10 mg total) by mouth daily. Kate Lonni CROME, MD  Active Self, Pharmacy Records  SYMBICORT  160-4.5 MCG/ACT inhaler [id number] Yes INHALE TWO PUFFS BY MOUTH INTO LUNGS TWICE DAILY Celestia Rosaline SQUIBB, NP  Active Self, Pharmacy Records           Med Note ANGUS MAXWELL   Thu Jun 29, 2023 11:51 AM) Unknown last dose.  XARELTO  20 MG TABS tablet [id number] Yes TAKE ONE TABLET BY MOUTH DAILY AT 5PM WITH SUPPER Celestia Rosaline SQUIBB, NP  Active Self, Pharmacy Records           Med Note [place], LAUREN  Thu Jun 29, 2023 11:52 AM) Patient says she takes it at night, will not confirm a more precise time.            Recommendation:   Continue Current Plan of Care  Follow Up Plan:   Referral to RN Case Manager Closing From:  Transitions of Care Program  Andrea Dimes RN, BSN [place]  [facility] RN Care Manager [phone]

## 2024-02-29 ENCOUNTER — Encounter (HOSPITAL_COMMUNITY): Admitting: Physical Therapy

## 2024-02-29 ENCOUNTER — Inpatient Hospital Stay (INDEPENDENT_AMBULATORY_CARE_PROVIDER_SITE_OTHER): Admitting: Primary Care

## 2024-03-04 ENCOUNTER — Ambulatory Visit (INDEPENDENT_AMBULATORY_CARE_PROVIDER_SITE_OTHER): Admitting: Primary Care

## 2024-03-04 ENCOUNTER — Ambulatory Visit: Admitting: Orthopaedic Surgery

## 2024-03-04 ENCOUNTER — Encounter (INDEPENDENT_AMBULATORY_CARE_PROVIDER_SITE_OTHER): Payer: Self-pay | Admitting: Primary Care

## 2024-03-04 VITALS — BP 93/65 | HR 87 | Temp 98.0°F | Resp 16 | Ht 72.0 in

## 2024-03-04 DIAGNOSIS — Z760 Encounter for issue of repeat prescription: Secondary | ICD-10-CM

## 2024-03-04 DIAGNOSIS — J449 Chronic obstructive pulmonary disease, unspecified: Secondary | ICD-10-CM | POA: Diagnosis not present

## 2024-03-04 NOTE — Progress Notes (Signed)
 [facility]  Ashby Leflore, is a 68 y.o. female  RDW:[id number]  FMW:[id number]  DOB - 27-Apr-1956  Chief Complaint  Patient presents with   Follow-up       Subjective:   Rhina Kramme is a 68 y.o. female here today for an acute visit.  Patient presented in a wheelchair she has gait instability, disheveled not at all her normal appearance.  Explained to her did not feel it was safe for her to be at home alone and needed to discuss alternative methods such as physical therapy in a nursing home discharge back to home or some kind of safety parameters for home.  She continues to fall.  Orders have been signed for home health nursing and should be having some care at the home.  She needs to be in physical therapy occupational therapy and someone with her 24-hour 7 days a week for at least 2 weeks or inpatient rehab.  Patient immediately called her sister and husband(separated) explained her choices and they will work out an arrangement for her to not be left alone until improvement in stability.   No problems updated.  Comprehensive ROS Pertinent positive and negative noted in HPI   Allergies  Allergen Reactions   Aspirin  Nausea Only and Other (See Comments)    Causes stomach pain    Past Medical History:  Diagnosis Date   Anemia    Anxiety    Arthritis    Asthma    Atrial fibrillation (HCC)    Bipolar 1 disorder (HCC)    Bulging lumbar disc    Chronic pain of left knee    COPD (chronic obstructive pulmonary disease) (HCC)    CVA (cerebral vascular accident) (HCC)    Diabetes mellitus    GERD (gastroesophageal reflux disease)    Neuropathy    TIA (transient ischemic attack)    Vertigo     Current Outpatient Medications on File Prior to Visit  Medication Sig Dispense Refill   ACCU-CHEK GUIDE test strip CHECK BLOOD SUGAR UP TO FOUR TIMES DAILY AS DIRECTED 100 strip 11   blood glucose meter kit and supplies Dispense based on patient and insurance  preference. Use up to four times daily as directed. (FOR ICD-10 E10.9, E11.9). 1 each 0   buPROPion  (WELLBUTRIN  SR) 150 MG 12 hr tablet TAKE ONE TABLET BY MOUTH TWICE DAILY @ 9AM & 9PM 180 tablet 1   divalproex  (DEPAKOTE  ER) 500 MG 24 hr tablet Take 500-1,000 mg by mouth See admin instructions. Take 500 mg in the morning and 1000 mg at bedtime     FEROSUL 325 (65 Fe) MG tablet TAKE ONE TABLET BY MOUTH DAILY AT 9AM WITH BREAKFAST 90 tablet 11   HYDROcodone -acetaminophen  (NORCO) 10-325 MG tablet Take 1 tablet by mouth every 6 (six) hours as needed for moderate pain (pain score 4-6).     ibuprofen  (ADVIL ) 200 MG tablet Take 400 mg by mouth every 6 (six) hours as needed.     metFORMIN  (GLUCOPHAGE ) 1000 MG tablet Take 1 tablet (1,000 mg total) by mouth 2 (two) times daily. TAKE ONE TABLET BY MOUTH TWICE DAILY @ 9AM & 5PM WITH MEALS 180 tablet 1   metoprolol  tartrate (LOPRESSOR ) 50 MG tablet TAKE ONE TABLET BY MOUTH TWICE DAILY @ 9AM & 5PM 180 tablet 3   OZEMPIC , 0.25 OR 0.5 MG/DOSE, 2 MG/3ML SOPN INJECT 0.5MG  ONCE WEEKLY 3 mL 1   rosuvastatin  (CRESTOR ) 10 MG tablet Take 1 tablet (10 mg total) by mouth daily.  90 tablet 2   XARELTO  20 MG TABS tablet TAKE ONE TABLET BY MOUTH DAILY AT 5PM WITH SUPPER 90 tablet 11   Insulin  Pen Needle (GNP ULTICARE PEN NEEDLES) 32G X 4 MM MISC USE TO INJECT INSULIN  UP TO 5 TIMES A DAY (Patient not taking: Reported on 02/28/2024) 200 each 2   Insulin  Syringe-Needle U-100 (ADVOCATE INSULIN  SYRINGE) 29G X 1/2 0.3 ML MISC Use to inject insulin  5x daily. (Patient not taking: Reported on 02/28/2024) 200 each 6   lidocaine  (LIDODERM ) 5 % Place 1 patch onto the skin daily. Remove & Discard patch within 12 hours or as directed by MD (Patient not taking: Reported on 02/28/2024) 5 patch 0   methocarbamol  (ROBAXIN ) 500 MG tablet Take 1 tablet (500 mg total) by mouth every 8 (eight) hours as needed for muscle spasms. (Patient not taking: Reported on 02/28/2024) 30 tablet 0   No current  facility-administered medications on file prior to visit.   Health Maintenance  Topic Date Due   Hepatitis C Screening  Never done   DTaP/Tdap/Td vaccine (1 - Tdap) Never done   Zoster (Shingles) Vaccine (1 of 2) Never done   Colon Cancer Screening  Never done   Mammogram  Never done   COVID-19 Vaccine (2 - Janssen risk series) 11/07/2019   DEXA scan (bone density measurement)  Never done   Yearly kidney health urinalysis for diabetes  03/10/2022   Medicare Annual Wellness Visit  05/18/2023   Eye exam for diabetics  11/03/2023   Complete foot exam   11/07/2023   Hemoglobin A1C  12/19/2023   Flu Shot  02/09/2024   Pneumococcal Vaccine for age over 68 (1 of 2 - PCV) 05/28/2024*   Yearly kidney function blood test for diabetes  06/27/2024   HPV Vaccine  Aged Out   Meningitis B Vaccine  Aged Out  *Topic was postponed. The date shown is not the original due date.    Objective:   Vitals:   03/04/24 1402  BP: 93/65  Pulse: 87  Resp: 16  Temp: 98 F (36.7 C)  TempSrc: Oral  SpO2: 100%  Height: 6' (1.829 m)    Physical Exam Vitals reviewed.  Constitutional:      Appearance: She is ill-appearing.  HENT:     Head: Normocephalic.     Right Ear: External ear normal.     Left Ear: External ear normal.     Nose: Nose normal.  Eyes:     Extraocular Movements: Extraocular movements intact.  Cardiovascular:     Rate and Rhythm: Normal rate.  Pulmonary:     Breath sounds: Wheezing present.  Abdominal:     General: Bowel sounds are normal. There is distension.     Palpations: Abdomen is soft.  Musculoskeletal:     Cervical back: Normal range of motion.  Skin:    General: Skin is warm and dry.  Neurological:     Mental Status: She is alert. Mental status is at baseline.  Psychiatric:        Mood and Affect: Mood normal.      Assessment & Plan  Lawana was seen today for follow-up.  Diagnoses and all orders for this visit:  Encounter for issue of repeat  prescription -     budesonide -formoterol  (SYMBICORT ) 160-4.5 MCG/ACT inhaler; Inhale 2 puffs into the lungs 2 (two) times daily. -     AMB Referral VBCI Care Management  Chronic obstructive pulmonary disease, unspecified (HCC) -  budesonide -formoterol  (SYMBICORT ) 160-4.5 MCG/ACT inhaler; Inhale 2 puffs into the lungs 2 (two) times daily.      Patient have been counseled extensively about nutrition and exercise. Other issues discussed during this visit include: low cholesterol diet, weight control and daily exercise, foot care, annual eye examinations at Ophthalmology, importance of adherence with medications and regular follow-up. We also discussed long term complications of uncontrolled diabetes and hypertension.   No follow-ups on file.  The patient was given clear instructions to go to ER or return to medical center if symptoms don't improve, worsen or new problems develop. The patient verbalized understanding. The patient was told to call to get lab results if they haven't heard anything in the next week.   This note has been created with Education officer, environmental. Any transcriptional errors are unintentional.   [name], NP 03/10/2024, 3:16 PM

## 2024-03-05 ENCOUNTER — Telehealth: Payer: Self-pay | Admitting: Cardiology

## 2024-03-05 ENCOUNTER — Encounter (HOSPITAL_COMMUNITY)

## 2024-03-05 DIAGNOSIS — N3942 Incontinence without sensory awareness: Secondary | ICD-10-CM

## 2024-03-05 DIAGNOSIS — Z760 Encounter for issue of repeat prescription: Secondary | ICD-10-CM

## 2024-03-05 NOTE — Telephone Encounter (Signed)
 Called and patient verbalized she will probably in in Skilled Nursing facilty and would like to cancel appt. Per patient she prefers not to reschedule at this time. Per patient she willl call back after been discharged from rehab. Made patient aware that Dr. Kate will be made aware.

## 2024-03-05 NOTE — Telephone Encounter (Signed)
 Per pt Rhonda Bohr, NP took her off all her medications yesterday at ov with her. Pt is calling to cancel her appt with Dr Victor for 02/10/24 because of this. I told patient I was going to have a nurse call her back since she was taken off her medication and to wait til she talks to the nurse before cancelling appt with Dr Victor. Please advise.

## 2024-03-07 ENCOUNTER — Other Ambulatory Visit: Payer: Self-pay

## 2024-03-07 ENCOUNTER — Other Ambulatory Visit (HOSPITAL_COMMUNITY): Payer: Self-pay

## 2024-03-07 ENCOUNTER — Telehealth (INDEPENDENT_AMBULATORY_CARE_PROVIDER_SITE_OTHER): Payer: Self-pay | Admitting: Primary Care

## 2024-03-07 ENCOUNTER — Encounter (HOSPITAL_COMMUNITY)

## 2024-03-07 MED ORDER — OXYBUTYNIN CHLORIDE 5 MG PO TABS
5.0000 mg | ORAL_TABLET | Freq: Two times a day (BID) | ORAL | 1 refills | Status: DC
  Filled 2024-03-07: qty 180, 90d supply, fill #0

## 2024-03-07 NOTE — Telephone Encounter (Signed)
 Copied from CRM #[id number]. Topic: General - Other >> Mar 07, 2024 12:01 PM Tiffany S wrote: Reason for CRM: oxybutynin  (DITROPAN ) 5 MG tablet [[id number]]  Patient is requesting call back regarding medication above please follow up with patient

## 2024-03-07 NOTE — Telephone Encounter (Signed)
 Copied from CRM #[id number]. Topic: General - Other >> Mar 07, 2024  2:57 PM DeAngela L wrote: Reason for CRM: Shanda with [employer] [facility] calling to ask if the office received the fax sent on 02/24/24 and 03/01/24 it is a letter of medical necessity and this has completed and faxed back to the office  The information in the letter faxed has to be on a paper with the physicians letter head and must include all the Patients Demo and the items needed this all has to be included in the letter with Physicians letter head and faxed back   any questions call Phone num  [phone]  Return Fax num [phone]

## 2024-03-08 ENCOUNTER — Ambulatory Visit: Admitting: Cardiology

## 2024-03-10 MED ORDER — BUDESONIDE-FORMOTEROL FUMARATE 160-4.5 MCG/ACT IN AERO: 2.0000 | INHALATION_SPRAY | Freq: Two times a day (BID) | RESPIRATORY_TRACT | 11 refills | Status: AC

## 2024-03-12 ENCOUNTER — Other Ambulatory Visit (INDEPENDENT_AMBULATORY_CARE_PROVIDER_SITE_OTHER): Payer: Self-pay | Admitting: Primary Care

## 2024-03-12 ENCOUNTER — Encounter (HOSPITAL_COMMUNITY)

## 2024-03-12 ENCOUNTER — Other Ambulatory Visit: Payer: Self-pay | Admitting: Cardiology

## 2024-03-12 ENCOUNTER — Telehealth: Payer: Self-pay | Admitting: Pharmacist

## 2024-03-12 DIAGNOSIS — Z760 Encounter for issue of repeat prescription: Secondary | ICD-10-CM

## 2024-03-12 DIAGNOSIS — Z720 Tobacco use: Secondary | ICD-10-CM

## 2024-03-12 NOTE — Telephone Encounter (Signed)
 Hey friend,   Received VBCI for pharmacist referral. Can you let me know what she needs? I cannot tell what her pharmacy needs are from her most recent OV note.

## 2024-03-12 NOTE — Telephone Encounter (Signed)
 Requested Prescriptions  Pending Prescriptions Disp Refills   buPROPion  (WELLBUTRIN  SR) 150 MG 12 hr tablet [Pharmacy Med Name: bupropion  HCl SR 150 mg tablet,12 hr sustained-release] 180 tablet 0    Sig: TAKE ONE TABLET TWICE DAILY     Psychiatry: Antidepressants - bupropion  Failed - 03/12/2024  3:50 PM      Failed - Cr in normal range and within 360 days    Creatinine, Ser  Date Value Ref Range Status  06/28/2023 1.03 (H) 0.44 - 1.00 mg/dL Final   Creatinine, Urine  Date Value Ref Range Status  12/17/2019 128.15 mg/dL Final    Comment:    Performed at [facility], [address], [place], [place] [address]         Failed - AST in normal range and within 360 days    AST  Date Value Ref Range Status  06/28/2023 13 (L) 15 - 41 U/L Final         Passed - ALT in normal range and within 360 days    ALT  Date Value Ref Range Status  06/28/2023 15 0 - 44 U/L Final         Passed - Last BP in normal range    BP Readings from Last 1 Encounters:  03/04/24 93/65         Passed - Valid encounter within last 6 months    Recent Outpatient Visits           1 week ago Chronic obstructive pulmonary disease, unspecified (HCC)   [place] [facility] Celestia Rosaline SQUIBB, NP   4 months ago Chondritis of auricle, bilateral   [place] [facility] Celestia Rosaline SQUIBB, NP   4 months ago Otalgia of both ears   [place] [facility] Celestia Rosaline SQUIBB, NP   5 months ago Hospital discharge follow-up   [place] [facility] Celestia Rosaline SQUIBB, NP   6 months ago Gait instability    [facility] Celestia Rosaline SQUIBB, NP               rivaroxaban  (XARELTO ) 20 MG TABS tablet [Pharmacy Med Name: Xarelto  20 mg tablet] 90 tablet 0    Sig: TAKE ONE TABLET DAILY AT 5PM WITH SUPPER     Hematology: Anticoagulants - rivaroxaban  Failed - 03/12/2024  3:50 PM      Failed - AST in normal range and  within 360 days    AST  Date Value Ref Range Status  06/28/2023 13 (L) 15 - 41 U/L Final         Failed - Cr in normal range and within 360 days    Creatinine, Ser  Date Value Ref Range Status  06/28/2023 1.03 (H) 0.44 - 1.00 mg/dL Final   Creatinine, Urine  Date Value Ref Range Status  12/17/2019 128.15 mg/dL Final    Comment:    Performed at [facility], [address], [place], [place] [address]         Passed - ALT in normal range and within 360 days    ALT  Date Value Ref Range Status  06/28/2023 15 0 - 44 U/L Final         Passed - HCT in normal range and within 360 days    Hematocrit  Date Value Ref Range Status  10/12/2023 36.8 34.0 - 46.6 % Final         Passed - HGB in normal range and  within 360 days    Hemoglobin  Date Value Ref Range Status  10/12/2023 11.3 11.1 - 15.9 g/dL Final         Passed - PLT in normal range and within 360 days    Platelets  Date Value Ref Range Status  10/12/2023 320 150 - 450 x10E3/uL Final         Passed - eGFR is 15 or above and within 360 days    GFR calc Af Amer  Date Value Ref Range Status  03/17/2020 >60 >60 mL/min Final   GFR, Estimated  Date Value Ref Range Status  06/28/2023 60 (L) >60 mL/min Final    Comment:    (NOTE) Calculated using the CKD-EPI Creatinine Equation (2021)    eGFR  Date Value Ref Range Status  04/19/2023 67 >59 mL/min/1.73 Final         Passed - Patient is not pregnant      Passed - Valid encounter within last 12 months    Recent Outpatient Visits           1 week ago Chronic obstructive pulmonary disease, unspecified (HCC)   [place] [facility] Celestia Rosaline SQUIBB, NP   4 months ago Chondritis of auricle, bilateral   [place] [facility] Celestia Rosaline SQUIBB, NP   4 months ago Otalgia of both ears   [place] [facility] Celestia Rosaline SQUIBB, NP   5 months ago Hospital discharge follow-up   [place] [facility] Celestia Rosaline SQUIBB, NP   6 months ago Gait instability   [place] [facility] Celestia Rosaline SQUIBB, NP

## 2024-03-13 ENCOUNTER — Telehealth (INDEPENDENT_AMBULATORY_CARE_PROVIDER_SITE_OTHER): Payer: Self-pay | Admitting: Primary Care

## 2024-03-13 NOTE — Telephone Encounter (Signed)
Forwarding to PCP for advise.

## 2024-03-13 NOTE — Telephone Encounter (Signed)
 [facility] called, [phone] Rhonda Hickman to check on the status of these corrections and when this will be faxed back over. Please advise.

## 2024-03-13 NOTE — Telephone Encounter (Signed)
 Copied from CRM [id number]. Topic: General - Other >> Mar 12, 2024 12:50 PM Nathanel BROCKS wrote: Reason for CRM:  Skippy Car[facility], [phone]  Form [id number] was faxed back over today but there is things that needs to be corrected.  -MCD number on top of both pages -And on step 5 Contact name and Physicians name and date.  Once this has been corrected please refax over.

## 2024-03-13 NOTE — Telephone Encounter (Signed)
 Forwarding this to the nurse filling in for PCP's CMA.   Jesslyn,   Do you know based on last PCP OV note what this patient's pharmacy needs were?

## 2024-03-13 NOTE — Telephone Encounter (Signed)
Noted with thanks

## 2024-03-14 ENCOUNTER — Encounter (HOSPITAL_COMMUNITY)

## 2024-03-14 NOTE — Telephone Encounter (Signed)
 Updated CMN for incontinence supplies emailed to [facility]: [email or web address]

## 2024-03-15 ENCOUNTER — Other Ambulatory Visit: Payer: Self-pay

## 2024-03-15 DIAGNOSIS — Z79899 Other long term (current) drug therapy: Secondary | ICD-10-CM | POA: Diagnosis not present

## 2024-03-15 DIAGNOSIS — M25562 Pain in left knee: Secondary | ICD-10-CM | POA: Diagnosis not present

## 2024-03-15 DIAGNOSIS — Z79891 Long term (current) use of opiate analgesic: Secondary | ICD-10-CM | POA: Diagnosis not present

## 2024-03-15 DIAGNOSIS — M25561 Pain in right knee: Secondary | ICD-10-CM | POA: Diagnosis not present

## 2024-03-15 DIAGNOSIS — F17210 Nicotine dependence, cigarettes, uncomplicated: Secondary | ICD-10-CM | POA: Diagnosis not present

## 2024-03-15 DIAGNOSIS — M5416 Radiculopathy, lumbar region: Secondary | ICD-10-CM | POA: Diagnosis not present

## 2024-03-15 DIAGNOSIS — G8929 Other chronic pain: Secondary | ICD-10-CM | POA: Diagnosis not present

## 2024-03-15 NOTE — Patient Instructions (Signed)
 Visit Information  [name] was given information about Medicaid Managed Care team care coordination services as a part of their [facility] Medicaid benefit.   If you would like to schedule transportation through your [facility], please call the following number at least 2 days in advance of your appointment: [phone]   Rides for urgent appointments can also be made after hours by calling Member Services.  Call the Behavioral Health Crisis Line at [phone], at any time, 24 hours a day, 7 days a week. If you are in danger or need immediate medical attention call 911.   Rhonda Hickman - following are the goals we discussed in your visit today:   Goals Addressed             This Visit's Progress    VBCI RN Care Plan       Problems:  Chronic Disease Management support and education needs related to HTN  Goal: Over the next 90 days the Patient will attend all scheduled medical appointments: with providers as evidenced by encounter notes for visits in EMR         continue to work with RN Care Manager and/or Social Worker to address care management and care coordination needs related to HTN as evidenced by adherence to care management team scheduled appointments     demonstrate a decrease HTN in exacerbations as evidenced by maintaining blood pressures within desired parameters < 140/80    demonstrate ongoing self health care management ability of hypertension as evidenced by maintaining a daily BP log , following sodium parameters of <1500 mg daily    demonstrate understanding of rationale for each prescribed medication as evidenced by verbalizing need for each medication    not experience hospital admission as evidenced by review of electronic medical record. Hospital Admissions in last 6 months = 0 take all medications exactly as prescribed and will call provider for medication related questions as evidenced by communication with provider  for medication questions or concerns     verbalize understanding of plan for management of hypertension as evidenced by stating importance of following daily blood pressures and lower sodium diet   Interventions:   Hypertension Interventions: Last practice recorded BP readings:  BP Readings from Last 3 Encounters:  03/04/24 93/65  11/02/23 129/72  10/25/23 119/74   Most recent eGFR/CrCl:  Lab Results  Component Value Date   EGFR 67 04/19/2023    No components found for: CRCL  Evaluation of current treatment plan related to hypertension self management and patient's adherence to plan as established by provider Provided education to patient re: stroke prevention, s/s of heart attack and stroke Reviewed medications with patient and discussed importance of compliance Counseled on adverse effects of illicit drug and excessive alcohol use in patients with high blood pressure  Provided assistance with obtaining home blood pressure monitor via patients Medicaid [facility] insurance  Counseled on the importance of exercise goals with target of 150 minutes per week Discussed plans with patient for ongoing care management follow up and provided patient with direct contact information for care management team Advised patient, providing education and rationale, to monitor blood pressure daily and record, calling PCP for findings outside established parameters Reviewed scheduled/upcoming provider appointments including:  Advised patient to discuss any concerns with provider Provided education on prescribed diet DASH  Discussed complications of poorly controlled blood pressure such as heart disease, stroke, circulatory complications, vision complications, kidney impairment, sexual dysfunction Screening for signs and  symptoms of depression related to chronic disease state  Assessed social determinant of health barriers  Patient Self-Care Activities:  Attend all scheduled provider appointments Call  pharmacy for medication refills 3-7 days in advance of running out of medications Call provider office for new concerns or questions  Notify RN Care Manager of TOC call rescheduling needs Perform all self care activities independently  Perform IADL's (shopping, preparing meals, housekeeping, managing finances) independently Take medications as prescribed   check blood pressure daily write blood pressure results in a log or diary learn about high blood pressure take blood pressure log to all doctor appointments call doctor for signs and symptoms of high blood pressure keep all doctor appointments take medications for blood pressure exactly as prescribed begin an exercise program report new symptoms to your doctor eat more whole grains, fruits and vegetables, lean meats and healthy fats limit salt intake to 1500 mg/day  Plan:  Telephone follow up appointment with care management team member scheduled for:  03-29-2024 at 10:00 AM           VBCI RN Care Plan       Problems:  Chronic Disease Management support and education needs related to DMII  Goal: Over the next 90 days the Patient will attend all scheduled medical appointments: with providers as evidenced by encounter for visit in Epic        continue to work with Medical illustrator and/or Social Worker to address care management and care coordination needs related to DMII as evidenced by adherence to care management team scheduled appointments     demonstrate a decrease DMII in exacerbations as evidenced by a lower A1C, weight control   demonstrate ongoing self health care management ability of diabetes as evidenced by maintaining a daily finger stick blood sugar log, daily foot checks scheduling an eye exam  demonstrate understanding of rationale for each prescribed medication as evidenced byverbalation of need for each medication   not experience hospital admission as evidenced by review of electronic medical record. Hospital  Admissions in last 6 months = 0 take all medications exactly as prescribed and will call provider for medication related questions as evidenced by communication with provider for any concerns or questions  verbalize basic understanding of diabetes disease process and self health management plan as evidenced by teaching back what diabetes is and steps to take to manage diabetes   Interventions:   Diabetes Interventions: Assessed patient's understanding of A1c goal: <6.5% Provided education to patient about basic DM disease process Reviewed medications with patient and discussed importance of medication adherence Counseled on importance of regular laboratory monitoring as prescribed Discussed plans with patient for ongoing care management follow up and provided patient with direct contact information for care management team Provided patient with written educational materials related to hypo and hyperglycemia and importance of correct treatment Reviewed scheduled/upcoming provider appointments including:  PCP 05-06-2024  DEXA Scan 06-26-2024 Advised patient, providing education and rationale, to check cbg in am fasting and record, calling PCP for findings outside established parameters Screening for signs and symptoms of depression related to chronic disease state  Assessed social determinant of health barriers Lab Results  Component Value Date   HGBA1C 6.8 (H) 06/20/2023    Patient Self-Care Activities:  Attend all scheduled provider appointments Call pharmacy for medication refills 3-7 days in advance of running out of medications Call provider office for new concerns or questions  Notify RN Care Manager of [facility] call rescheduling needs Perform all self  care activities independently  Perform IADL's (shopping, preparing meals, housekeeping, managing finances) independently Take medications as prescribed   schedule appointment with eye doctor check blood sugar at prescribed times: before  meals and at bedtime first thing in am before eating anything  check feet daily for cuts, sores or redness enter blood sugar readings and medication or insulin  into daily log take the blood sugar log to all doctor visits set goal weight trim toenails straight across drink 6 to 8 glasses of water each day eat fish at least once per week fill half of plate with vegetables manage portion size set a realistic goal switch to sugar-free drinks wash and dry feet carefully every day  Plan:  Telephone follow up appointment with care management team member scheduled for:  03-29-2024 at 10:00 AM              Please see education materials related to diabetes and hypertension  provided by MyChart link.  Patient verbalizes understanding of instructions and care plan provided today and agrees to view in MyChart. Active MyChart status and patient understanding of how to access instructions and care plan via MyChart confirmed with patient.      Telephone follow up appointment with Managed Medicaid care management team member scheduled for:  03-29-2024 10:00 AM    Hendricks Her RN, BSN  [place] VBCI-Population Health RN Case Manager   Direct Dial [phone]   Following is a copy of your plan of care:  There are no care plans that you recently modified to display for this patient.

## 2024-03-19 ENCOUNTER — Other Ambulatory Visit (HOSPITAL_COMMUNITY): Payer: Self-pay

## 2024-03-20 DIAGNOSIS — Z79899 Other long term (current) drug therapy: Secondary | ICD-10-CM | POA: Diagnosis not present

## 2024-03-22 ENCOUNTER — Telehealth (INDEPENDENT_AMBULATORY_CARE_PROVIDER_SITE_OTHER): Payer: Self-pay | Admitting: *Deleted

## 2024-03-22 NOTE — Telephone Encounter (Signed)
 Copied from CRM [id number]. Topic: Clinical - Medical Advice >> Mar 22, 2024 11:27 AM Viola FALCON wrote: FYI - patient experiencing dizziness, fatigue, and nausea due to ozympic, she refused nurse triage and seeing another provider. She schedule next available with Rosaline Bohr for 04/11/24.

## 2024-03-22 NOTE — Telephone Encounter (Signed)
 Patient states she will still continue to take the medication. Advised to d/c if she has bloating and vomiting.  Will call for appt cancellation.

## 2024-03-26 NOTE — Telephone Encounter (Signed)
 Signed PCS request efaxed to [facility]: [phone].

## 2024-03-27 NOTE — Telephone Encounter (Signed)
 Copied from CRM #[id number]. Topic: General - Other >> Mar 21, 2024  3:27 PM Turkey B wrote: Reason for CRM: Rhonda Hickman from [facility] ,states need primary dx code for incontinent supplies

## 2024-03-28 DIAGNOSIS — M6281 Muscle weakness (generalized): Secondary | ICD-10-CM | POA: Diagnosis not present

## 2024-03-29 ENCOUNTER — Other Ambulatory Visit: Payer: Self-pay

## 2024-03-29 NOTE — Patient Instructions (Signed)
 Visit Information  Ms. Weatherbee was given information about Medicaid Managed Care team care coordination services as a part of their [facility] Community Plan Medicaid benefit.   If you would like to schedule transportation through your [facility], please call the following number at least 2 days in advance of your appointment: [phone]   Rides for urgent appointments can also be made after hours by calling Member Services.  Call the Behavioral Health Crisis Line at [phone], at any time, 24 hours a day, 7 days a week. If you are in danger or need immediate medical attention call 911.  Please see education materials related to Hypertension and Diabetes  provided by MyChart link.  Patient verbalizes understanding of instructions and care plan provided today and agrees to view in MyChart. Active MyChart status and patient understanding of how to access instructions and care plan via MyChart confirmed with patient.     Telephone follow up appointment with Managed Medicaid care management team member scheduled for:04-29-2024 at 1:00 PM   Hendricks Her RN, BSN  [place] VBCI-Population Health RN Case Manager   Direct Dial [phone]   Following is a copy of your plan of care:  There are no care plans that you recently modified to display for this patient.

## 2024-04-01 ENCOUNTER — Telehealth: Payer: Self-pay

## 2024-04-01 NOTE — Telephone Encounter (Signed)
 Copied from CRM [id number]. Topic: Clinical - Prescription Issue >> Mar 29, 2024  4:57 PM Harlene ORN wrote: Reason for CRM:  christina - [facility]  Pt is incontinent and has been trying to ge the patient's incontinental supplies for over a month  Call back the Pharmacy. Phone: [phone]   Resolved: I called [facility] to see if they had faxed any paperwork, They stated that they did on 9/5. I ask if they could send it to [facility] I also informed them that the providers was out and fax number given.

## 2024-04-02 ENCOUNTER — Ambulatory Visit: Payer: Self-pay

## 2024-04-02 NOTE — Telephone Encounter (Signed)
 FYI Only or Action Required?: Action required by provider: states still missing wash cloths and would like that called in to please.  Would also like an order to get a blood pressure cuff and life alert button.  Patient was last seen in primary care on 03/04/2024 by Rhonda Rosaline SQUIBB, NP.  Called Nurse Triage reporting No chief complaint on file..  Symptoms began today.  Interventions attempted: Nothing.  Symptoms are: stable.  Triage Disposition: No disposition on file.  Patient/caregiver understands and will follow disposition?:    Copied from CRM [id number]. Topic: General - Other >> Apr 02, 2024 10:39 AM Rhonda Hickman wrote: Reason for CRM: pt called back please follow up with pt if needed

## 2024-04-08 ENCOUNTER — Telehealth (INDEPENDENT_AMBULATORY_CARE_PROVIDER_SITE_OTHER): Payer: Self-pay | Admitting: Primary Care

## 2024-04-08 NOTE — Telephone Encounter (Signed)
 Copied from CRM [id number]. Topic: General - Other >> Apr 08, 2024  2:13 PM Antony RAMAN wrote: Reason for CRM: Tangela from [facility] called inquiring if we received medical necessity paperwork for incontinence supplies for the pt. Also mentioned the cmn physicians order that was sent on 9/22 needs to be corrected for which code is primary and initial and date it for the correction. Tangelas cb- [phone]

## 2024-04-08 NOTE — Telephone Encounter (Signed)
 Awaiting PCP to return. Incontinence was not addressed at [facility].

## 2024-04-10 ENCOUNTER — Telehealth: Payer: Self-pay

## 2024-04-10 ENCOUNTER — Other Ambulatory Visit: Payer: Self-pay | Admitting: Family Medicine

## 2024-04-10 NOTE — Telephone Encounter (Signed)
 I called [facility] : [phone] and spoke to Rashaad regarding the outstanding order for incontinence products.  He explained that all they need is to have the primary diagnosis on the CMN noted and initialed by the provider, they do not need anything else.   CMN updated by Haze Servant, NP and the updated order was emailed to [email or web address]

## 2024-04-10 NOTE — Telephone Encounter (Signed)
 Fax received from [facility]  requesting medical records. .  I called [facility] to inquire what services the patient is receiving . I spoke to [employer] who explained that the patient sees their psychiatrist and her next appointment is at the end of the year.

## 2024-04-11 ENCOUNTER — Ambulatory Visit (INDEPENDENT_AMBULATORY_CARE_PROVIDER_SITE_OTHER): Admitting: Primary Care

## 2024-04-11 NOTE — Telephone Encounter (Signed)
 Note was sent on 04/10/2024.

## 2024-04-18 ENCOUNTER — Encounter: Payer: Self-pay | Admitting: Orthopaedic Surgery

## 2024-04-18 ENCOUNTER — Other Ambulatory Visit

## 2024-04-18 ENCOUNTER — Ambulatory Visit: Admitting: Orthopaedic Surgery

## 2024-04-18 VITALS — Ht 72.0 in | Wt 232.0 lb

## 2024-04-18 DIAGNOSIS — M1711 Unilateral primary osteoarthritis, right knee: Secondary | ICD-10-CM | POA: Diagnosis not present

## 2024-04-18 DIAGNOSIS — M25561 Pain in right knee: Secondary | ICD-10-CM | POA: Diagnosis not present

## 2024-04-18 DIAGNOSIS — G8929 Other chronic pain: Secondary | ICD-10-CM

## 2024-04-18 NOTE — Progress Notes (Signed)
 The patient is a 68 year old female well-known to us .  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  She says that knee is done very well.  Her right knee has been bothering quite a bit and she has been having a lot of falls.  Her right knee has varus malalignment and pain throughout the arc of motion of her knee.  He is ligamentously stable but is very painful and there is a lot of patellofemoral crepitation.  The left operative knee moves smoothly and fluidly with full range of motion.  X-rays of the right knee show bone-on-bone arthritis of the right knee with varus malalignment and complete loss medial joint space and patellofemoral joint and osteophytes in all 3 compartments.    At this point she would like to go ahead and proceed with a right knee replacement.  We have seen her for her right knee for some time now and she has even had in the remote history of arthroscopic surgery on the right knee with a partial medial meniscectomy.  She has tried and failed all forms conservative treatment and would rather not have any more steroid injections.  She is a diabetic and does not want this to affect her blood glucose.  Having had knee replacement surgery before she is fully aware of the risks and benefits of the surgery and what to expect from an intraoperative and postoperative standpoint.  She would like us  to go ahead and schedule her for a right total knee arthroplasty.  We will be in touch with scheduling the surgery.

## 2024-04-23 ENCOUNTER — Ambulatory Visit (INDEPENDENT_AMBULATORY_CARE_PROVIDER_SITE_OTHER): Admitting: Primary Care

## 2024-04-23 DIAGNOSIS — E119 Type 2 diabetes mellitus without complications: Secondary | ICD-10-CM | POA: Diagnosis not present

## 2024-04-23 DIAGNOSIS — R739 Hyperglycemia, unspecified: Secondary | ICD-10-CM | POA: Diagnosis not present

## 2024-04-24 NOTE — Progress Notes (Signed)
 Rhonda Hickman                                          MRN: [id number]   04/24/2024   The VBCI Quality Team Specialist reviewed this patient medical record for the purposes of chart review for care gap closure. The following were reviewed: abstraction for care gap closure-glycemic status assessment.    VBCI Quality Team

## 2024-04-25 ENCOUNTER — Other Ambulatory Visit: Payer: Self-pay | Admitting: Physician Assistant

## 2024-04-25 DIAGNOSIS — Z01818 Encounter for other preprocedural examination: Secondary | ICD-10-CM

## 2024-04-26 DIAGNOSIS — M25562 Pain in left knee: Secondary | ICD-10-CM | POA: Diagnosis not present

## 2024-04-26 DIAGNOSIS — M5416 Radiculopathy, lumbar region: Secondary | ICD-10-CM | POA: Diagnosis not present

## 2024-04-26 DIAGNOSIS — M25561 Pain in right knee: Secondary | ICD-10-CM | POA: Diagnosis not present

## 2024-04-26 DIAGNOSIS — G8929 Other chronic pain: Secondary | ICD-10-CM | POA: Diagnosis not present

## 2024-04-26 DIAGNOSIS — Z79899 Other long term (current) drug therapy: Secondary | ICD-10-CM | POA: Diagnosis not present

## 2024-04-26 DIAGNOSIS — F17210 Nicotine dependence, cigarettes, uncomplicated: Secondary | ICD-10-CM | POA: Diagnosis not present

## 2024-04-27 DIAGNOSIS — M6281 Muscle weakness (generalized): Secondary | ICD-10-CM | POA: Diagnosis not present

## 2024-04-29 ENCOUNTER — Other Ambulatory Visit: Payer: Self-pay

## 2024-04-29 ENCOUNTER — Telehealth

## 2024-04-29 NOTE — Patient Instructions (Signed)
 Visit Information  Rhonda Hickman was given information about Medicaid Managed Care team care coordination services as a part of their [facility] Community Plan Medicaid benefit.   If you would like to schedule transportation through your [facility], please call the following number at least 2 days in advance of your appointment: [phone]   Rides for urgent appointments can also be made after hours by calling Member Services.  Call the Behavioral Health Crisis Line at [phone], at any time, 24 hours a day, 7 days a week. If you are in danger or need immediate medical attention call 911.  Please see education materials related to Hypertension and Diabetes by MyChart link.  The patient verbalized understanding of instructions, educational materials, and care plan provided today and agreed to receive a mailed copy of patient instructions, educational materials, and care plan.   Telephone follow up appointment with Managed Medicaid care management team member scheduled for: 05-28-2024 at 10:00 AM   Hendricks Her RN, BSN  [place] VBCI-Population Health RN Case Manager   Direct Dial [phone]   Following is a copy of your plan of care:  There are no care plans that you recently modified to display for this patient.

## 2024-05-01 ENCOUNTER — Encounter (HOSPITAL_COMMUNITY): Admission: RE | Admit: 2024-05-01 | Source: Ambulatory Visit

## 2024-05-03 ENCOUNTER — Other Ambulatory Visit: Payer: Self-pay | Admitting: Physician Assistant

## 2024-05-03 NOTE — Patient Instructions (Signed)
 SURGICAL WAITING ROOM VISITATION Patients having surgery or a procedure may have no more than 2 support people in the waiting area - these visitors may rotate in the visitor waiting room.   Due to an increase in RSV and influenza rates and associated hospitalizations, children ages 65 and under may not visit patients in [facility] hospitals. If the patient needs to stay at the hospital during part of their recovery, the visitor guidelines for inpatient rooms apply.  PRE-OP VISITATION  Pre-op nurse will coordinate an appropriate time for 1 support person to accompany the patient in pre-op.  This support person may not rotate.  This visitor will be contacted when the time is appropriate for the visitor to come back in the pre-op area.  Please refer to the [facility] website for the visitor guidelines for Inpatients (after your surgery is over and you are in a regular room).  You are not required to quarantine at this time prior to your surgery. However, you must do this: Hand Hygiene often Do NOT share personal items Notify your provider if you are in close contact with someone who has COVID or you develop fever 100.4 or greater, new onset of sneezing, cough, sore throat, shortness of breath or body aches.  If you test positive for Covid or have been in contact with anyone that has tested positive in the last 10 days please notify you surgeon.    Your procedure is scheduled on:  05/10/24  Report to [facility] Main Entrance: [place] entrance where the [employer] is available.   Report to admitting at: 6:00 AM  Call this number if you have any questions or problems the morning of surgery [phone]  FOLLOW ANY ADDITIONAL PRE OP INSTRUCTIONS YOU RECEIVED FROM YOUR SURGEON'S OFFICE!!!  Do not eat food after Midnight the night prior to your surgery/procedure.  After Midnight you may have the following liquids until: 5:30 AM DAY OF SURGERY  Clear Liquid Diet Water Black  Coffee (sugar ok, NO MILK/CREAM OR CREAMERS)  Tea (sugar ok, NO MILK/CREAM OR CREAMERS) regular and decaf                             Plain Jell-O  with no fruit (NO RED)                                           Fruit ices (not with fruit pulp, NO RED)                                     Popsicles (NO RED)                                                                  Juice: NO CITRUS JUICES: only apple, WHITE grape, WHITE cranberry Sports drinks like Gatorade or Powerade (NO RED)   The day of surgery:  Drink ONE (1) Pre-Surgery Clear G2 at : 5:30 AM the morning of surgery. Drink in one sitting. Do not sip.  This drink was given  to you during your hospital pre-op appointment visit. Nothing else to drink after completing the Pre-Surgery Clear Ensure or G2 : No candy, chewing gum or throat lozenges.    Oral Hygiene is also important to reduce your risk of infection.        Remember - BRUSH YOUR TEETH THE MORNING OF SURGERY WITH YOUR REGULAR TOOTHPASTE  Do NOT smoke after Midnight the night before surgery.  STOP TAKING all Vitamins, Herbs and supplements 1 week before your surgery.   Take ONLY these medicines the morning of surgery with A SIP OF WATER: Depakote ,bupropion ,metoprolol .Use inhalers as usual and bring them. How to Manage Your Diabetes Before and After Surgery  Why is it important to control my blood sugar before and after surgery? Improving blood sugar levels before and after surgery helps healing and can limit problems. A way of improving blood sugar control is eating a healthy diet by:  Eating less sugar and carbohydrates  Increasing activity/exercise  Talking with your doctor about reaching your blood sugar goals High blood sugars (greater than 180 mg/dL) can raise your risk of infections and slow your recovery, so you will need to focus on controlling your diabetes during the weeks before surgery. Make sure that the doctor who takes care of your diabetes knows about  your planned surgery including the date and location.  How do I manage my blood sugar before surgery? Check your blood sugar at least 4 times a day, starting 2 days before surgery, to make sure that the level is not too high or low. Check your blood sugar the morning of your surgery when you wake up and every 2 hours until you get to the Short Stay unit. If your blood sugar is less than 70 mg/dL, you will need to treat for low blood sugar: Do not take insulin . Treat a low blood sugar (less than 70 mg/dL) with  cup of clear juice (cranberry or apple), 4 glucose tablets, OR glucose gel. Recheck blood sugar in 15 minutes after treatment (to make sure it is greater than 70 mg/dL). If your blood sugar is not greater than 70 mg/dL on recheck, call [phone] for further instructions. Report your blood sugar to the short stay nurse when you get to Short Stay.  If you are admitted to the hospital after surgery: Your blood sugar will be checked by the staff and you will probably be given insulin  after surgery (instead of oral diabetes medicines) to make sure you have good blood sugar levels. The goal for blood sugar control after surgery is 80-180 mg/dL.   WHAT DO I DO ABOUT MY DIABETES MEDICATION?  Do not take oral diabetes medicines (pills) the morning of surgery.  THE NIGHT BEFORE SURGERY, take ONLY half of lantus  insulin  dose..      THE MORNING OF SURGERY, take ONLY half of lantus   insulin  dose..  DO NOT TAKE THE FOLLOWING 7 DAYS PRIOR TO SURGERY: Ozempic , Wegovy, Rybelsus (Semaglutide ), Byetta (exenatide), Bydureon (exenatide ER), Victoza, Saxenda (liraglutide), or Trulicity (dulaglutide) Mounjaro (Tirzepatide) Adlyxin (Lixisenatide), Polyethylene Glycol Loxenatide.HOLD Ozempic  after: 05/02/24    Before surgery.Stop taking ___________on __________as instructed by _____________.  Stop taking ____________as directed by your Surgeon/Cardiologist.  Contact your Surgeon/Cardiologist for  instructions on Anticoagulant Therapy prior to surgery. These are anesthesia recommendations for holding your anticoagulants.  Please contact your prescribing physician to confirm IF it is safe to hold your anticoagulants for this length of time.   Eliquis Apixaban   72 hours   Xarelto   Rivaroxaban    72 hours  Plavix Clopidogrel   120 hours  Pletal Cilostazol   120 hours   If You have been diagnosed with Sleep Apnea - Bring CPAP mask and tubing day of surgery. We will provide you with a CPAP machine on the day of your surgery.                   You may not have any metal on your body including hair pins, jewelry, and body piercing  Do not wear make-up, lotions, powders, perfumes / cologne, or deodorant  Do not wear nail polish including gel and S&S, artificial / acrylic nails, or any other type of covering on natural nails including finger and toenails. If you have artificial nails, gel coating, etc., that needs to be removed by a nail salon, Please have this removed prior to surgery. Not doing so may mean that your surgery could be cancelled or delayed if the Surgeon or anesthesia staff feels like they are unable to monitor you safely.   Do not shave 48 hours prior to surgery to avoid nicks in your skin which may contribute to postoperative infections.    Contacts, Hearing Aids, dentures or bridgework may not be worn into surgery. DENTURES WILL BE REMOVED PRIOR TO SURGERY PLEASE DO NOT APPLY Poly grip OR ADHESIVES!!!  You may bring a small overnight bag with you on the day of surgery, only pack items that are not valuable. [facility] IS NOT RESPONSIBLE   FOR VALUABLES THAT ARE LOST OR STOLEN.   Patients discharged on the day of surgery will not be allowed to drive home.  Someone NEEDS to stay with you for the first 24 hours after anesthesia.  Do not bring your home medications to the hospital. The Pharmacy will dispense medications listed on your medication list to you during your  admission in the Hospital.  Special Instructions: Bring a copy of your healthcare power of attorney and living will documents the day of surgery, if you wish to have them scanned into your [facility]- EPIC  Please read over the following fact sheets you were given: IF YOU HAVE QUESTIONS ABOUT YOUR PRE-OP INSTRUCTIONS, PLEASE CALL [phone]   [facility] - Preparing for Surgery      Before surgery, you can play an important role.  Because skin is not sterile, your skin needs to be as free of germs as possible.  You can reduce the number of germs on your skin by washing with CHG (chlorahexidine gluconate) soap before surgery.  CHG is an antiseptic cleaner which kills germs and bonds with the skin to continue killing germs even after washing. Please DO NOT use if you have an allergy to CHG or antibacterial soaps.  If your skin becomes reddened/irritated stop using the CHG and inform your nurse when you arrive at Short Stay. Do not shave (including legs and underarms) for at least 48 hours prior to the first CHG shower.  You may shave your face/neck.  Please follow these instructions carefully:  1.  Shower with CHG Soap the night before surgery ONLY (DO NOT USE THE SOAP THE MORNING OF SURGERY).  2.  If you choose to wash your hair, wash your hair first as usual with your normal  shampoo.  3.  After you shampoo, rinse your hair and body thoroughly to remove the shampoo.  4.  Use CHG as you would any other liquid soap.  You can apply chg directly to the skin and wash.  Gently with a scrungie or clean washcloth.  5.  Apply the CHG Soap to your body ONLY FROM THE NECK DOWN.   Do not use on face/ open                           Wound or open sores. Avoid contact with eyes, ears mouth and genitals (private parts).                       Wash face,  Genitals (private parts) with your normal soap.             6.  Wash thoroughly, paying special attention to the  area where your  surgery  will be performed.  7.  Thoroughly rinse your body with warm water from the neck down.  8.  DO NOT shower/wash with your normal soap after using and rinsing off the CHG Soap.                9.  Pat yourself dry with a clean towel.            10.  Wear clean pajamas.            11.  Place clean sheets on your bed the night of your first shower and do not  sleep with pets.  Day of Surgery : Do not apply any CHG, lotions/deodorants the morning of surgery.  Please wear clean clothes to the hospital/surgery center.   FAILURE TO FOLLOW THESE INSTRUCTIONS MAY RESULT IN THE CANCELLATION OF YOUR SURGERY  PATIENT SIGNATURE_________________________________  NURSE SIGNATURE__________________________________  ________________________________________________________________________  Pre-operative 4 CHG Bath Instructions  DYNA-Hex 4 Chlorhexidine  Gluconate 4% Solution Antiseptic 4 fl. oz   You can play a key role in reducing the risk of infection after surgery. Your skin needs to be as free of germs as possible. You can reduce the number of germs on your skin by washing with CHG (chlorhexidine  gluconate) soap before surgery. CHG is an antiseptic soap that kills germs and continues to kill germs even after washing.   DO NOT use if you have an allergy to chlorhexidine /CHG or antibacterial soaps. If your skin becomes reddened or irritated, stop using the CHG and notify one of our RNs at   Please shower with the CHG soap starting 4 days before surgery using the following schedule:     Please keep in mind the following:  DO NOT shave, including legs and underarms, starting the day of your first shower.   You may shave your face at any point before/day of surgery.  Place clean sheets on your bed the day you start using CHG soap. Use a clean washcloth (not used since being washed) for each shower. DO NOT sleep with pets once you start using the CHG.  CHG Shower Instructions:   If you choose to wash your hair and private area, wash first with your normal shampoo/soap.  After you use shampoo/soap, rinse your hair and body thoroughly to remove shampoo/soap residue.  Turn the water OFF and apply about 3 tablespoons (45 ml) of CHG soap to a CLEAN washcloth.  Apply CHG soap ONLY FROM YOUR NECK DOWN TO YOUR TOES (washing for 3-5 minutes)  DO NOT use CHG soap on face, private areas, open wounds, or sores.  Pay special  attention to the area where your surgery is being performed.  If you are having back surgery, having someone wash your back for you may be helpful. Wait 2 minutes after CHG soap is applied, then you may rinse off the CHG soap.  Pat dry with a clean towel  Put on clean clothes/pajamas   If you choose to wear lotion, please use ONLY the CHG-compatible lotions on the back of this paper.     Additional instructions for the day of surgery: DO NOT APPLY any lotions, deodorants, cologne, or perfumes.   Put on clean/comfortable clothes.  Brush your teeth.  Ask your nurse before applying any prescription medications to the skin.   CHG Compatible Lotions   Aveeno Moisturizing lotion  Cetaphil Moisturizing Cream  Cetaphil Moisturizing Lotion  Clairol Herbal Essence Moisturizing Lotion, Dry Skin  Clairol Herbal Essence Moisturizing Lotion, Extra Dry Skin  Clairol Herbal Essence Moisturizing Lotion, Normal Skin  Curel Age Defying Therapeutic Moisturizing Lotion with Alpha Hydroxy  Curel Extreme Care Body Lotion  Curel Soothing Hands Moisturizing Hand Lotion  Curel Therapeutic Moisturizing Cream, Fragrance-Free  Curel Therapeutic Moisturizing Lotion, Fragrance-Free  Curel Therapeutic Moisturizing Lotion, Original Formula  Eucerin Daily Replenishing Lotion  Eucerin Dry Skin Therapy Plus Alpha Hydroxy Crme  Eucerin Dry Skin Therapy Plus Alpha Hydroxy Lotion  Eucerin Original Crme  Eucerin Original Lotion  Eucerin Plus Crme Eucerin Plus Lotion  Eucerin  TriLipid Replenishing Lotion  Keri Anti-Bacterial Hand Lotion  Keri Deep Conditioning Original Lotion Dry Skin Formula Softly Scented  Keri Deep Conditioning Original Lotion, Fragrance Free Sensitive Skin Formula  Keri Lotion Fast Absorbing Fragrance Free Sensitive Skin Formula  Keri Lotion Fast Absorbing Softly Scented Dry Skin Formula  Keri Original Lotion  Keri Skin Renewal Lotion Keri Silky Smooth Lotion  Keri Silky Smooth Sensitive Skin Lotion  Nivea Body Creamy Conditioning Oil  Nivea Body Extra Enriched Lotion  Nivea Body Original Lotion  Nivea Body Sheer Moisturizing Lotion Nivea Crme  Nivea Skin Firming Lotion  NutraDerm 30 Skin Lotion  NutraDerm Skin Lotion  NutraDerm Therapeutic Skin Cream  NutraDerm Therapeutic Skin Lotion  ProShield Protective Hand Cream  Provon moisturizing lotion  Incentive Spirometer  An incentive spirometer is a tool that can help keep your lungs clear and active. This tool measures how well you are filling your lungs with each breath. Taking long deep breaths may help reverse or decrease the chance of developing breathing (pulmonary) problems (especially infection) following: A long period of time when you are unable to move or be active. BEFORE THE PROCEDURE  If the spirometer includes an indicator to show your best effort, your nurse or respiratory therapist will set it to a desired goal. If possible, sit up straight or lean slightly forward. Try not to slouch. Hold the incentive spirometer in an upright position. INSTRUCTIONS FOR USE  Sit on the edge of your bed if possible, or sit up as far as you can in bed or on a chair. Hold the incentive spirometer in an upright position. Breathe out normally. Place the mouthpiece in your mouth and seal your lips tightly around it. Breathe in slowly and as deeply as possible, raising the piston or the ball toward the top of the column. Hold your breath for 3-5 seconds or for as long as possible. Allow the  piston or ball to fall to the bottom of the column. Remove the mouthpiece from your mouth and breathe out normally. Rest for a few seconds and repeat Steps 1  through 7 at least 10 times every 1-2 hours when you are awake. Take your time and take a few normal breaths between deep breaths. The spirometer may include an indicator to show your best effort. Use the indicator as a goal to work toward during each repetition. After each set of 10 deep breaths, practice coughing to be sure your lungs are clear. If you have an incision (the cut made at the time of surgery), support your incision when coughing by placing a pillow or rolled up towels firmly against it. Once you are able to get out of bed, walk around indoors and cough well. You may stop using the incentive spirometer when instructed by your caregiver.  RISKS AND COMPLICATIONS Take your time so you do not get dizzy or light-headed. If you are in pain, you may need to take or ask for pain medication before doing incentive spirometry. It is harder to take a deep breath if you are having pain. AFTER USE Rest and breathe slowly and easily. It can be helpful to keep track of a log of your progress. Your caregiver can provide you with a simple table to help with this. If you are using the spirometer at home, follow these instructions: SEEK MEDICAL CARE IF:  You are having difficultly using the spirometer. You have trouble using the spirometer as often as instructed. Your pain medication is not giving enough relief while using the spirometer. You develop fever of 100.5 F (38.1 C) or higher. SEEK IMMEDIATE MEDICAL CARE IF:  You cough up bloody sputum that had not been present before. You develop fever of 102 F (38.9 C) or greater. You develop worsening pain at or near the incision site. MAKE SURE YOU:  Understand these instructions. Will watch your condition. Will get help right away if you are not doing well or get worse. Document  Released: 11/07/2006 Document Revised: 09/19/2011 Document Reviewed: 01/08/2007 [facility] Patient Information 2014 [place], [place].   ________________________________________________________________________

## 2024-05-06 ENCOUNTER — Encounter (HOSPITAL_COMMUNITY)
Admission: RE | Admit: 2024-05-06 | Discharge: 2024-05-06 | Disposition: A | Discharge status: Home / Self Care | Source: Ambulatory Visit | Attending: Orthopaedic Surgery | Admitting: Orthopaedic Surgery

## 2024-05-06 ENCOUNTER — Other Ambulatory Visit: Payer: Self-pay

## 2024-05-06 ENCOUNTER — Ambulatory Visit (INDEPENDENT_AMBULATORY_CARE_PROVIDER_SITE_OTHER): Admitting: Primary Care

## 2024-05-06 ENCOUNTER — Encounter (HOSPITAL_COMMUNITY): Payer: Self-pay

## 2024-05-06 VITALS — BP 149/82 | HR 76 | Temp 98.3°F | Ht 72.0 in | Wt 233.7 lb

## 2024-05-06 DIAGNOSIS — Z01818 Encounter for other preprocedural examination: Secondary | ICD-10-CM | POA: Diagnosis not present

## 2024-05-06 DIAGNOSIS — R296 Repeated falls: Secondary | ICD-10-CM

## 2024-05-06 DIAGNOSIS — J4489 Other specified chronic obstructive pulmonary disease: Secondary | ICD-10-CM | POA: Diagnosis not present

## 2024-05-06 DIAGNOSIS — N3942 Incontinence without sensory awareness: Secondary | ICD-10-CM

## 2024-05-06 DIAGNOSIS — Z7951 Long term (current) use of inhaled steroids: Secondary | ICD-10-CM | POA: Diagnosis not present

## 2024-05-06 DIAGNOSIS — I4891 Unspecified atrial fibrillation: Secondary | ICD-10-CM | POA: Insufficient documentation

## 2024-05-06 DIAGNOSIS — M1711 Unilateral primary osteoarthritis, right knee: Secondary | ICD-10-CM | POA: Insufficient documentation

## 2024-05-06 DIAGNOSIS — Z8673 Personal history of transient ischemic attack (TIA), and cerebral infarction without residual deficits: Secondary | ICD-10-CM | POA: Diagnosis not present

## 2024-05-06 DIAGNOSIS — E1100 Type 2 diabetes mellitus with hyperosmolarity without nonketotic hyperglycemic-hyperosmolar coma (NKHHC): Secondary | ICD-10-CM

## 2024-05-06 DIAGNOSIS — R2681 Unsteadiness on feet: Secondary | ICD-10-CM | POA: Diagnosis not present

## 2024-05-06 DIAGNOSIS — I2510 Atherosclerotic heart disease of native coronary artery without angina pectoris: Secondary | ICD-10-CM | POA: Insufficient documentation

## 2024-05-06 HISTORY — DX: Dyspnea, unspecified: R06.00

## 2024-05-06 HISTORY — DX: Cardiac arrhythmia, unspecified: I49.9

## 2024-05-06 HISTORY — DX: Atherosclerotic heart disease of native coronary artery without angina pectoris: I25.10

## 2024-05-06 LAB — BASIC METABOLIC PANEL WITH GFR
Anion gap: 11 (ref 5–15)
BUN: 14 mg/dL (ref 8–23)
CO2: 24 mmol/L (ref 22–32)
Calcium: 9.3 mg/dL (ref 8.9–10.3)
Chloride: 104 mmol/L (ref 98–111)
Creatinine, Ser: 0.94 mg/dL (ref 0.44–1.00)
GFR, Estimated: >60 mL/min (ref >60)
Glucose, Bld: 191 mg/dL — ABNORMAL HIGH (ref 70–99)
Potassium: 4.3 mmol/L (ref 3.5–5.1)
Sodium: 139 mmol/L (ref 135–145)

## 2024-05-06 LAB — HEMOGLOBIN A1C
Hgb A1c MFr Bld: 7.5 % — ABNORMAL HIGH (ref 4.8–5.6)
Mean Plasma Glucose: 168.55 mg/dL

## 2024-05-06 LAB — CBC
HCT: 34.6 % — ABNORMAL LOW (ref 36.0–46.0)
Hemoglobin: 10.5 g/dL — ABNORMAL LOW (ref 12.0–15.0)
MCH: 25 pg — ABNORMAL LOW (ref 26.0–34.0)
MCHC: 30.3 g/dL (ref 30.0–36.0)
MCV: 82.4 fL (ref 80.0–100.0)
Platelets: 271 K/uL (ref 150–400)
RBC: 4.2 MIL/uL (ref 3.87–5.11)
RDW: 18.4 % — ABNORMAL HIGH (ref 11.5–15.5)
WBC: 10.5 K/uL (ref 4.0–10.5)
nRBC: 0 % (ref 0.0–0.2)

## 2024-05-06 LAB — SURGICAL PCR SCREEN
MRSA, PCR: NEGATIVE
Staphylococcus aureus: NEGATIVE

## 2024-05-06 LAB — GLUCOSE, CAPILLARY: Glucose-Capillary: 171 mg/dL — ABNORMAL HIGH (ref 70–99)

## 2024-05-06 NOTE — Progress Notes (Signed)
 [facility]  Visit via Telephone Note  I connected with Rhonda Hickman, on 05/06/2024 at 1:59 PM by telephone and verified that I am speaking with the correct person using two identifiers.   Consent: I discussed the limitations, risks, security and privacy concerns of performing an evaluation and management service by telephone and the availability of in person appointments. I also discussed with the patient that there may be a patient responsible charge related to this service. The patient expressed understanding and agreed to proceed.   Location of Patient: Home  Location of Provider: [facility]   Persons participating in visit: Rhonda Hickman Rhonda Celestia,  NP   History of Present Illness: Rhonda Hickman is a 68 year old obese female called to question why she did not keep her appointment.  Patient is upset with provider her friend told her it was stated in providers notes she was to be placed in a nursing home.  Explained to patient verbally told her was not comfortable living by herself with the frequent amount of falls she was having, decreased and weakness in lower extremities.  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   Past Medical History:  Diagnosis Date   Anemia    Anxiety    Arthritis    Asthma    Atrial fibrillation (HCC)    Bipolar 1 disorder (HCC)    Bulging lumbar disc    Chronic pain of left knee    COPD (chronic obstructive pulmonary disease) (HCC)    Coronary artery disease    CVA (cerebral vascular accident) (HCC)    Diabetes mellitus    Dyspnea    Dysrhythmia    GERD (gastroesophageal reflux disease)    Neuropathy    TIA (transient ischemic attack)    Vertigo    Allergies  Allergen Reactions   Aspirin  Nausea Only and Other (See Comments)    Causes stomach pain    Current Outpatient Medications on File Prior to Visit  Medication Sig Dispense  Refill   ACCU-CHEK GUIDE test strip CHECK BLOOD SUGAR UP TO FOUR TIMES DAILY AS DIRECTED 100 strip 11   amLODipine  (NORVASC ) 5 MG tablet Take 5 mg by mouth daily.     blood glucose meter kit and supplies Dispense based on patient and insurance preference. Use up to four times daily as directed. (FOR ICD-10 E10.9, E11.9). 1 each 0   budesonide -formoterol  (SYMBICORT ) 160-4.5 MCG/ACT inhaler Inhale 2 puffs into the lungs 2 (two) times daily. 10.2 g 11   buPROPion  (WELLBUTRIN  SR) 150 MG 12 hr tablet TAKE ONE TABLET TWICE DAILY 180 tablet 0   divalproex  (DEPAKOTE  ER) 500 MG 24 hr tablet Take 500-1,000 mg by mouth See admin instructions. Take 500 mg by mouth in the morning and 1000 mg at bedtime     FEROSUL 325 (65 Fe) MG tablet TAKE ONE TABLET BY MOUTH DAILY AT 9AM WITH BREAKFAST 90 tablet 11   HYDROcodone -acetaminophen  (NORCO) 10-325 MG tablet Take 1 tablet by mouth 3 (three) times daily as needed for moderate pain (pain score 4-6).     ibuprofen  (ADVIL ) 200 MG tablet Take 400 mg by mouth every 6 (six) hours as needed.     Insulin  Pen Needle (GNP ULTICARE PEN NEEDLES) 32G X 4 MM MISC USE TO INJECT INSULIN  UP TO 5 TIMES A DAY 200 each 2   Insulin  Syringe-Needle U-100 (ADVOCATE INSULIN  SYRINGE) 29G X 1/2 0.3 ML MISC Use to inject  insulin  5x daily. 200 each 6   LANTUS  SOLOSTAR 100 UNIT/ML Solostar Pen Inject 20 Units into the skin 2 (two) times daily.     lidocaine  (LIDODERM ) 5 % Place 1 patch onto the skin daily. Remove & Discard patch within 12 hours or as directed by MD (Patient not taking: No sig reported) 5 patch 0   metFORMIN  (GLUCOPHAGE ) 1000 MG tablet Take 1 tablet (1,000 mg total) by mouth 2 (two) times daily. TAKE ONE TABLET BY MOUTH TWICE DAILY @ 9AM & 5PM WITH MEALS 180 tablet 1   methocarbamol  (ROBAXIN ) 500 MG tablet Take 1 tablet (500 mg total) by mouth every 8 (eight) hours as needed for muscle spasms. (Patient not taking: Reported on 04/29/2024) 30 tablet 0   metoprolol  tartrate (LOPRESSOR )  50 MG tablet TAKE ONE TABLET BY MOUTH TWICE DAILY @ 9AM & 5PM 180 tablet 3   oxybutynin  (DITROPAN ) 5 MG tablet Take 1 tablet (5 mg total) by mouth 2-3 (two to three) times daily for bladder spasms. 180 tablet 1   OZEMPIC , 0.25 OR 0.5 MG/DOSE, 2 MG/3ML SOPN INJECT 0.5MG  ONCE WEEKLY 3 mL 1   rivaroxaban  (XARELTO ) 20 MG TABS tablet TAKE ONE TABLET DAILY AT 5PM WITH SUPPER 90 tablet 0   rosuvastatin  (CRESTOR ) 10 MG tablet TAKE ONE TABLET ONCE DAILY 90 tablet 0   No current facility-administered medications on file prior to visit.    Observations/Objective: See HPI falls  Assessment and Plan: Diagnoses and all orders for this visit:  Urinary incontinence without sensory awareness Stress urinary incontinence wears briefs  Gait instability 2/2Falls frequently Per conversation her sister is staying with her He is also followed by pain management and receiving narcotics questionable if this is be a underlying source of falls       Follow Up Instructions: After orthoplasty and discharge   I discussed the assessment and treatment plan with the patient. The patient was provided an opportunity to ask questions and all were answered. The patient agreed with the plan and demonstrated an understanding of the instructions.   The patient was advised to call back or seek an in-person evaluation if the symptoms worsen or if the condition fails to improve as anticipated.     I provided 22 minutes total time during this encounter including median intraservice time, reviewing previous notes, investigations, ordering medications, medical decision making, coordinating care and patient verbalized understanding at the end of the visit.    This note has been created with Education officer, environmental. Any transcriptional errors are unintentional.   Rhonda SHAUNNA Bohr, NP 05/06/2024, 1:59 PM

## 2024-05-06 NOTE — Progress Notes (Signed)
 For Anesthesia: PCP - Celestia Rosaline SQUIBB, NP  Cardiologist - Kate Lonni CROME, MD   Bowel Prep reminder:  Chest x-ray -  EKG - 06/28/23 Stress Test -  ECHO - 08/26/22 Cardiac Cath -  Pacemaker/ICD device last checked: Pacemaker orders received: Device Rep notified:  Spinal Cord Stimulator:N/A  Sleep Study - N/A CPAP -   Fasting Blood Sugar - 140's Checks Blood Sugar __1___ times a day Date and result of last Hgb A1c-5.9: 01/18/24  Last dose of GLP1 agonist- ozempic  on hold since 04/22/24 GLP1 instructions: Hold 7 days prior to schedule (Hold 24 hours-daily)   Last dose of SGLT-2 inhibitors- N/A SGLT-2 instructions: Hold 72 hours prior to surgery  Blood Thinner Instructions:Xarelto  : on hold since 04/22/24 Last Dose: Time last taken:  Aspirin  Instructions:N/A Last Dose: Time last taken:  Activity level: Unable to go up a flight of stairs without shortness of breath    Anesthesia review: Hx: Afib,CVA,COPD,Smoker,TIA,DIA.  Patient denies shortness of breath, fever, cough and chest pain at PAT appointment   Patient verbalized understanding of instructions that were reviewed over the telephone.

## 2024-05-07 ENCOUNTER — Telehealth (HOSPITAL_BASED_OUTPATIENT_CLINIC_OR_DEPARTMENT_OTHER): Payer: Self-pay | Admitting: *Deleted

## 2024-05-07 NOTE — Progress Notes (Signed)
 Anesthesia Chart Review   Case: [id number] Date/Time: 05/10/24 0815   Procedure: ARTHROPLASTY, KNEE, TOTAL (Right: Knee)   Anesthesia type: Spinal   Diagnosis: Primary osteoarthritis of right knee [M17.11]   Pre-op diagnosis: osteoarthritis right knee   Location: [facility] ROOM 10 / [facility]   Surgeons: Vernetta Lonni GRADE, MD       DISCUSSION:68 y.o. smoker with h/o COPD, atrial fibrillation, CVA, CAD, M II (A1C 7.5), right knee OA scheduled for above procedure 05/10/2024 with Dr. Lonni Vernetta.   Per cardiology preoperative evaluation 05/08/2024, Given unremarkable echocardiogram and nonobstructive CAD on coronary CTA earlier this year, no further cardiac workup recommended prior to her surgery. Would hold Xarelto  for 2 days prior to surgery and restart once okay from surgical standpoint  Pt reports Xarelto  on hold since 04/22/2024.   Pt advised to hold Ozempic  1 week prior to procedure.   VS: BP (!) 149/82   Pulse 76   Temp 36.8 C (Oral)   Ht 6' (1.829 m)   Wt 106 kg   SpO2 100%   BMI 31.69 kg/m   PROVIDERS: Celestia Rosaline SQUIBB, NP is PCP   Cardiologist - Kate Lonni CROME, MD  LABS: Labs reviewed: Acceptable for surgery. (all labs ordered are listed, but only abnormal results are displayed)  Labs Reviewed  HEMOGLOBIN A1C - Abnormal; Notable for the following components:      Result Value   Hgb A1c MFr Bld 7.5 (*)    All other components within normal limits  CBC - Abnormal; Notable for the following components:   Hemoglobin 10.5 (*)    HCT 34.6 (*)    MCH 25.0 (*)    RDW 18.4 (*)    All other components within normal limits  BASIC METABOLIC PANEL WITH GFR - Abnormal; Notable for the following components:   Glucose, Bld 191 (*)    All other components within normal limits  GLUCOSE, CAPILLARY - Abnormal; Notable for the following components:   Glucose-Capillary 171 (*)    All other components within normal limits  SURGICAL PCR SCREEN  TYPE AND  SCREEN     IMAGES:   EKG:   CV: Echo 08/26/2022  1. Left ventricular ejection fraction, by estimation, is 55 to 60%. The  left ventricle has normal function. The left ventricle has no regional  wall motion abnormalities. There is mild concentric left ventricular  hypertrophy. Left ventricular diastolic  parameters are consistent with Grade I diastolic dysfunction (impaired  relaxation).   2. Right ventricular systolic function is normal. The right ventricular  size is normal.   3. Left atrial size was mildly dilated.   4. The mitral valve is normal in structure. Trivial mitral valve  regurgitation. No evidence of mitral stenosis.   5. The aortic valve is tricuspid. Aortic valve regurgitation is not  visualized. No aortic stenosis is present.   6. The inferior vena cava is normal in size with greater than 50%  respiratory variability, suggesting right atrial pressure of 3 mmHg.   Myocardial Perfusion 01/03/2020 The left ventricular ejection fraction is mildly decreased (45-54%). Nuclear stress EF: 48%. There was no ST segment deviation noted during stress. Defect 1: There is a small defect of mild severity present in the apical anterior location. This is a low risk study.   Mildly abnormal, low risk stress nuclear study with breast attenuation but no ischemia.  Gated ejection fraction 48% with global hypokinesis and mild left ventricular enlargement. Past Medical History:  Diagnosis Date  Anemia    Anxiety    Arthritis    Asthma    Atrial fibrillation (HCC)    Bipolar 1 disorder (HCC)    Bulging lumbar disc    Chronic pain of left knee    COPD (chronic obstructive pulmonary disease) (HCC)    Coronary artery disease    CVA (cerebral vascular accident) (HCC)    Diabetes mellitus    Dyspnea    Dysrhythmia    GERD (gastroesophageal reflux disease)    Neuropathy    TIA (transient ischemic attack)    Vertigo     Past Surgical History:  Procedure Laterality Date    ABDOMINAL HYSTERECTOMY     CARPAL TUNNEL RELEASE Right    CHOLECYSTECTOMY     KNEE ARTHROSCOPY WITH LATERAL MENISECTOMY Left 06/08/2021   Procedure: KNEE ARTHROSCOPY WITH LATERAL MENISCECTOMY AND MEDIAL MENISCECTOMY;  Surgeon: Margrette Taft BRAVO, MD;  Location: [facility];  Service: Orthopedics;  Laterality: Left;   KNEE ARTHROSCOPY WITH MEDIAL MENISECTOMY Right 04/14/2016   Procedure: KNEE ARTHROSCOPY WITH MEDIAL AND LATERAL MENISECTOMY, MICROFRACTURE REPAIR;  Surgeon: Taft BRAVO Margrette, MD;  Location: [facility];  Service: Orthopedics;  Laterality: Right;  lateral menisectomy - needs crutch training   TOTAL KNEE ARTHROPLASTY Left 06/20/2023   Procedure: LEFT TOTAL KNEE ARTHROPLASTY;  Surgeon: Vernetta Lonni GRADE, MD;  Location: [facility];  Service: Orthopedics;  Laterality: Left;    MEDICATIONS:  ACCU-CHEK GUIDE test strip   amLODipine  (NORVASC ) 5 MG tablet   blood glucose meter kit and supplies   budesonide -formoterol  (SYMBICORT ) 160-4.5 MCG/ACT inhaler   buPROPion  (WELLBUTRIN  SR) 150 MG 12 hr tablet   divalproex  (DEPAKOTE  ER) 500 MG 24 hr tablet   FEROSUL 325 (65 Fe) MG tablet   HYDROcodone -acetaminophen  (NORCO) 10-325 MG tablet   ibuprofen  (ADVIL ) 200 MG tablet   Insulin  Pen Needle (GNP ULTICARE PEN NEEDLES) 32G X 4 MM MISC   Insulin  Syringe-Needle U-100 (ADVOCATE INSULIN  SYRINGE) 29G X 1/2 0.3 ML MISC   LANTUS  SOLOSTAR 100 UNIT/ML Solostar Pen   lidocaine  (LIDODERM ) 5 %   metFORMIN  (GLUCOPHAGE ) 1000 MG tablet   methocarbamol  (ROBAXIN ) 500 MG tablet   metoprolol  tartrate (LOPRESSOR ) 50 MG tablet   oxybutynin  (DITROPAN ) 5 MG tablet   OZEMPIC , 0.25 OR 0.5 MG/DOSE, 2 MG/3ML SOPN   rivaroxaban  (XARELTO ) 20 MG TABS tablet   rosuvastatin  (CRESTOR ) 10 MG tablet   No current facility-administered medications for this encounter.     Harlene Hoots Ward, PA-C [facility] [phone]

## 2024-05-07 NOTE — Telephone Encounter (Signed)
   Pre-operative Risk Assessment    Patient Name: Amberle Lyter  DOB: 09-01-1955 MRN: [id number]   Date of last office visit: 04/19/23 DR. [facility] Date of next office visit: 05/16/24 DR. [facility]   Request for Surgical Clearance    Procedure:  RIGHT TOTAL KNEE ARTHROPLASTY  Date of Surgery:  Clearance 05/10/24                                Surgeon:  DR. LONNI POLI Surgeon's Group or Practice Name:  [facility] Phone number:  [phone] Fax number:  [phone] ATTN: SHERRIE Type of Clearance Requested:   - Medical  - Pharmacy:  Hold Rivaroxaban  (Xarelto )     Type of Anesthesia:  Spinal & BLOCK   Additional requests/questions:    Bonney Niels Jest   05/07/2024, 11:41 AM

## 2024-05-08 NOTE — Telephone Encounter (Signed)
 Patient with diagnosis of afib on Xarelto  for anticoagulation.    Procedure: RIGHT TOTAL KNEE ARTHROPLASTY  Date of procedure: 05/10/24   CHA2DS2-VASc Score = 7   This indicates a 11.2% annual risk of stroke. The patient's score is based upon: CHF History: 0 HTN History: 1 Diabetes History: 1 Stroke History: 2 Vascular Disease History: 1 Age Score: 1 Gender Score: 1      CrCl 79 ml/min Platelet count 271  Patient has not had an Afib/aflutter ablation in the last 3 months, DCCV within the last 4 weeks or a watchman implanted in the last 45 days   Previously cleared to hold 3 days by Dr. Kate  Per office protocol, patient can hold Xarelto  for 3 days prior to procedure.    **This guidance is not considered finalized until pre-operative APP has relayed final recommendations.**

## 2024-05-08 NOTE — Telephone Encounter (Signed)
   Patient Name: Rhonda Hickman  DOB: [birth year] MRN: [id number]  Primary Cardiologist: Lonni LITTIE [name], MD  Clinical pharmacists have reviewed the patient's past medical history, labs, and current medications as part of preoperative protocol coverage. The following recommendations have been made:  Per Dr.Schumann: 04/18/2024 Given unremarkable echocardiogram and nonobstructive CAD on coronary CTA earlier this year, no further cardiac workup recommended prior to her surgery. Would hold Xarelto  for 2 days prior to surgery and restart once okay from surgical standpoint   Per Pharmacy 05/08/2024 Patient with diagnosis of afib on Xarelto  for anticoagulation.     Procedure: RIGHT TOTAL KNEE ARTHROPLASTY  Date of procedure: 05/10/24     CHA2DS2-VASc Score = 7   This indicates a 11.2% annual risk of stroke. The patient's score is based upon: CHF History: 0 HTN History: 1 Diabetes History: 1 Stroke History: 2 Vascular Disease History: 1 Age Score: 1 Gender Score: 1    CrCl 79 ml/min Platelet count 271   Patient has not had an Afib/aflutter ablation in the last 3 months, DCCV within the last 4 weeks or a watchman implanted in the last 45 days    Previously cleared to hold 3 days by Dr. [name]   Per office protocol, patient can hold Xarelto  for 3 days prior to procedure   I will route this recommendation to the requesting party via Epic fax function and remove from pre-op pool.  Please call with questions.  Lamarr Satterfield, NP 05/08/2024, 3:58 PM

## 2024-05-09 NOTE — H&P (Signed)
 TOTAL KNEE ADMISSION H&P  Patient is being admitted for right total knee arthroplasty.  Subjective:  Chief Complaint:right knee pain.  HPI: Rhonda Hickman, 68 y.o. female, has a history of pain and functional disability in the right knee due to arthritis and has failed non-surgical conservative treatments for greater than 12 weeks to includeNSAID's and/or analgesics, corticosteriod injections, viscosupplementation injections, flexibility and strengthening excercises, supervised PT with diminished ADL's post treatment, use of assistive devices, weight reduction as appropriate, and activity modification.  Onset of symptoms was gradual, starting a few years ago with gradually worsening course since that time. The patient noted no past surgery on the right knee(s).  Patient currently rates pain in the right knee(s) at 10 out of 10 with activity. Patient has night pain, worsening of pain with activity and weight bearing, pain that interferes with activities of daily living, pain with passive range of motion, crepitus, and joint swelling.  Patient has evidence of subchondral sclerosis, periarticular osteophytes, and joint space narrowing by imaging studies. There is no active infection.  Patient Active Problem List   Diagnosis Date Noted   OA (osteoarthritis) of knee 06/20/2023   Status post total left knee replacement 06/20/2023   Lumbar radiculopathy 02/01/2023   S/P left knee arthroscopy11/29/22 07/13/2021   Tear of meniscus of knee joint    Acute lower UTI 05/23/2021   A-fib (HCC) 05/22/2021   Leukocytosis 05/22/2021   Fall at home, initial encounter 05/22/2021   HLD (hyperlipidemia) 05/22/2021   Hypertension 03/18/2020   Bipolar 1 disorder (HCC)    Chronic atrial fibrillation (HCC) 12/03/2019   TIA (transient ischemic attack) 12/03/2019   Diabetic peripheral neuropathy (HCC) 12/03/2019   Chronic migraine 12/03/2019   Vertigo 08/03/2019   Bronchiectasis with acute exacerbation (HCC)     Obesity, Class III, BMI 40-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) (HCC)    Gastroesophageal reflux disease    Asthma exacerbation 06/06/2018   Gait abnormality 08/29/2017   Paresthesia 08/29/2017   Derangement of posterior horn of medial meniscus of right knee    Meniscus, lateral, derangement, right    Unilateral primary osteoarthritis, right knee    COPD with acute exacerbation (HCC) 03/09/2016   Type 2 diabetes mellitus (HCC) 02/16/2007   Cocaine abuse (HCC) 02/16/2007   Extrinsic asthma 02/16/2007   HOMELESSNESS, HX OF 02/16/2007   Past Medical History:  Diagnosis Date   Anemia    Anxiety    Arthritis    Asthma    Atrial fibrillation (HCC)    Bipolar 1 disorder (HCC)    Bulging lumbar disc    Chronic pain of left knee    COPD (chronic obstructive pulmonary disease) (HCC)    Coronary artery disease    CVA (cerebral vascular accident) (HCC)    Diabetes mellitus    Dyspnea    Dysrhythmia    GERD (gastroesophageal reflux disease)    Neuropathy    TIA (transient ischemic attack)    Vertigo     Past Surgical History:  Procedure Laterality Date   ABDOMINAL HYSTERECTOMY     CARPAL TUNNEL RELEASE Right    CHOLECYSTECTOMY     KNEE ARTHROSCOPY WITH LATERAL MENISECTOMY Left 06/08/2021   Procedure: KNEE ARTHROSCOPY WITH LATERAL MENISCECTOMY AND MEDIAL MENISCECTOMY;  Surgeon: Margrette Taft BRAVO, MD;  Location: [facility];  Service: Orthopedics;  Laterality: Left;   KNEE ARTHROSCOPY WITH MEDIAL MENISECTOMY Right 04/14/2016   Procedure: KNEE ARTHROSCOPY WITH MEDIAL AND LATERAL MENISECTOMY, MICROFRACTURE REPAIR;  Surgeon: Taft BRAVO Margrette, MD;  Location: [facility];  Service: Orthopedics;  Laterality: Right;  lateral menisectomy - needs crutch training   TOTAL KNEE ARTHROPLASTY Left 06/20/2023   Procedure: LEFT TOTAL KNEE ARTHROPLASTY;  Surgeon: Vernetta Lonni GRADE, MD;  Location: [facility];  Service: Orthopedics;  Laterality: Left;    No current facility-administered medications for this encounter.    Current Outpatient Medications  Medication Sig Dispense Refill Last Dose/Taking   amLODipine  (NORVASC ) 5 MG tablet Take 5 mg by mouth daily.   Taking   budesonide -formoterol  (SYMBICORT ) 160-4.5 MCG/ACT inhaler Inhale 2 puffs into the lungs 2 (two) times daily. 10.2 g 11 Taking   buPROPion  (WELLBUTRIN  SR) 150 MG 12 hr tablet TAKE ONE TABLET TWICE DAILY 180 tablet 0 Taking   divalproex  (DEPAKOTE  ER) 500 MG 24 hr tablet Take 500-1,000 mg by mouth See admin instructions. Take 500 mg by mouth in the morning and 1000 mg at bedtime   Taking   FEROSUL 325 (65 Fe) MG tablet TAKE ONE TABLET BY MOUTH DAILY AT 9AM WITH BREAKFAST 90 tablet 11 Taking   HYDROcodone -acetaminophen  (NORCO) 10-325 MG tablet Take 1 tablet by mouth 3 (three) times daily as needed for moderate pain (pain score 4-6).   Taking As Needed   LANTUS  SOLOSTAR 100 UNIT/ML Solostar Pen Inject 20 Units into the skin 2 (two) times daily.   Taking   metFORMIN  (GLUCOPHAGE ) 1000 MG tablet Take 1 tablet (1,000 mg total) by mouth 2 (two) times daily. TAKE ONE TABLET BY MOUTH TWICE DAILY @ 9AM & 5PM WITH MEALS 180 tablet 1 Taking   metoprolol  tartrate (LOPRESSOR ) 50 MG tablet TAKE ONE TABLET BY MOUTH TWICE DAILY @ 9AM & 5PM 180 tablet 3 Taking   oxybutynin  (DITROPAN ) 5 MG tablet Take 1 tablet (5 mg total) by mouth 2-3 (two to three) times daily for bladder spasms. 180 tablet 1 Taking   OZEMPIC , 0.25 OR 0.5 MG/DOSE, 2 MG/3ML SOPN INJECT 0.5MG  ONCE WEEKLY 3 mL 1 Taking   rivaroxaban  (XARELTO ) 20 MG TABS tablet TAKE ONE TABLET DAILY AT 5PM WITH SUPPER 90 tablet 0 Taking   rosuvastatin  (CRESTOR ) 10 MG tablet TAKE ONE TABLET ONCE DAILY 90 tablet 0 Taking   ACCU-CHEK GUIDE test strip CHECK BLOOD SUGAR UP TO FOUR TIMES DAILY AS DIRECTED 100 strip 11    blood glucose meter kit and supplies Dispense based on patient and insurance preference. Use up to four times daily as directed. (FOR ICD-10 E10.9, E11.9). 1 each 0    ibuprofen  (ADVIL ) 200 MG tablet Take 400  mg by mouth every 6 (six) hours as needed.      Insulin  Pen Needle (GNP ULTICARE PEN NEEDLES) 32G X 4 MM MISC USE TO INJECT INSULIN  UP TO 5 TIMES A DAY 200 each 2    Insulin  Syringe-Needle U-100 (ADVOCATE INSULIN  SYRINGE) 29G X 1/2 0.3 ML MISC Use to inject insulin  5x daily. 200 each 6    lidocaine  (LIDODERM ) 5 % Place 1 patch onto the skin daily. Remove & Discard patch within 12 hours or as directed by MD (Patient not taking: No sig reported) 5 patch 0    methocarbamol  (ROBAXIN ) 500 MG tablet Take 1 tablet (500 mg total) by mouth every 8 (eight) hours as needed for muscle spasms. (Patient not taking: Reported on 04/29/2024) 30 tablet 0    Allergies  Allergen Reactions   Aspirin  Nausea Only and Other (See Comments)    Causes stomach pain    Social History   Tobacco Use   Smoking status: Some Days  Current packs/day: 0.50    Types: Cigarettes   Smokeless tobacco: Never   Tobacco comments:    tobacco info given  Substance Use Topics   Alcohol use: No    Family History  Problem Relation Age of Onset   Diabetes Mother    Heart attack Father      Review of Systems  Objective:  Physical Exam Vitals reviewed.  Constitutional:      Appearance: Normal appearance. She is normal weight.  HENT:     Head: Normocephalic and atraumatic.  Eyes:     Extraocular Movements: Extraocular movements intact.     Pupils: Pupils are equal, round, and reactive to light.  Cardiovascular:     Rate and Rhythm: Normal rate and regular rhythm.  Pulmonary:     Effort: Pulmonary effort is normal.     Breath sounds: Normal breath sounds.  Abdominal:     Palpations: Abdomen is soft.  Musculoskeletal:     Cervical back: Normal range of motion and neck supple.     Right knee: Effusion, bony tenderness and crepitus present. Decreased range of motion. Tenderness present over the medial joint line and lateral joint line. Abnormal alignment and abnormal meniscus.  Neurological:     Mental Status: She  is alert and oriented to person, place, and time.  Psychiatric:        Behavior: Behavior normal.     Vital signs in last 24 hours:    Labs:   Estimated body mass index is 31.69 kg/m as calculated from the following:   Height as of 05/06/24: 6' (1.829 m).   Weight as of 05/06/24: 106 kg.   Imaging Review Plain radiographs demonstrate severe degenerative joint disease of the right knee(s). The overall alignment ismild varus. The bone quality appears to be good for age and reported activity level.      Assessment/Plan:  End stage arthritis, right knee   The patient history, physical examination, clinical judgment of the provider and imaging studies are consistent with end stage degenerative joint disease of the right knee(s) and total knee arthroplasty is deemed medically necessary. The treatment options including medical management, injection therapy arthroscopy and arthroplasty were discussed at length. The risks and benefits of total knee arthroplasty were presented and reviewed. The risks due to aseptic loosening, infection, stiffness, patella tracking problems, thromboembolic complications and other imponderables were discussed. The patient acknowledged the explanation, agreed to proceed with the plan and consent was signed. Patient is being admitted for inpatient treatment for surgery, pain control, PT, OT, prophylactic antibiotics, VTE prophylaxis, progressive ambulation and ADL's and discharge planning. The patient is planning to be discharged home with home health services

## 2024-05-09 NOTE — Anesthesia Preprocedure Evaluation (Signed)
 Anesthesia Evaluation  Patient identified by MRN, date of birth, ID band Patient awake    Reviewed: Allergy & Precautions, NPO status , Patient's Chart, lab work & pertinent test results  Airway Mallampati: II  TM Distance: >3 FB Neck ROM: Full    Dental  (+) Dental Advisory Given   Pulmonary asthma , COPD, Current Smoker and Patient abstained from smoking.   breath sounds clear to auscultation       Cardiovascular hypertension, Pt. on medications and Pt. on home beta blockers + CAD  + dysrhythmias Atrial Fibrillation  Rhythm:Regular Rate:Normal     Neuro/Psych  Headaches TIA Neuromuscular disease CVA    GI/Hepatic Neg liver ROS,GERD  ,,  Endo/Other  diabetes, Type 2    Renal/GU negative Renal ROS     Musculoskeletal  (+) Arthritis ,    Abdominal   Peds  Hematology  (+) Blood dyscrasia, anemia   Anesthesia Other Findings   Reproductive/Obstetrics                              Anesthesia Physical Anesthesia Plan  ASA: 3  Anesthesia Plan: General   Post-op Pain Management: Tylenol  PO (pre-op)* and Regional block*   Induction: Intravenous  PONV Risk Score and Plan: 2 and Dexamethasone , Ondansetron  and Treatment may vary due to age or medical condition  Airway Management Planned: Oral ETT  Additional Equipment: None  Intra-op Plan:   Post-operative Plan: Extubation in OR  Informed Consent: I have reviewed the patients History and Physical, chart, labs and discussed the procedure including the risks, benefits and alternatives for the proposed anesthesia with the patient or authorized representative who has indicated his/her understanding and acceptance.     Dental advisory given  Plan Discussed with: CRNA  Anesthesia Plan Comments:          Anesthesia Quick Evaluation

## 2024-05-10 ENCOUNTER — Encounter (HOSPITAL_COMMUNITY): Admitting: Physician Assistant

## 2024-05-10 ENCOUNTER — Telehealth (INDEPENDENT_AMBULATORY_CARE_PROVIDER_SITE_OTHER): Payer: Self-pay

## 2024-05-10 ENCOUNTER — Encounter (HOSPITAL_COMMUNITY)
Admission: RE | Disposition: A | Discharge status: Skilled Nursing Facility | Payer: Self-pay | Source: Home / Self Care | Attending: Orthopaedic Surgery

## 2024-05-10 ENCOUNTER — Encounter (HOSPITAL_COMMUNITY): Payer: Self-pay | Admitting: Orthopaedic Surgery

## 2024-05-10 ENCOUNTER — Ambulatory Visit (HOSPITAL_COMMUNITY): Admitting: Anesthesiology

## 2024-05-10 ENCOUNTER — Inpatient Hospital Stay (HOSPITAL_COMMUNITY)
Admission: AD | Admit: 2024-05-10 | Discharge: 2024-05-14 | DRG: 469 | Disposition: A | Discharge status: Skilled Nursing Facility | Attending: Orthopaedic Surgery | Admitting: Orthopaedic Surgery

## 2024-05-10 ENCOUNTER — Observation Stay (HOSPITAL_COMMUNITY)

## 2024-05-10 ENCOUNTER — Other Ambulatory Visit: Payer: Self-pay

## 2024-05-10 DIAGNOSIS — E1122 Type 2 diabetes mellitus with diabetic chronic kidney disease: Secondary | ICD-10-CM | POA: Diagnosis present

## 2024-05-10 DIAGNOSIS — N1832 Chronic kidney disease, stage 3b: Secondary | ICD-10-CM | POA: Diagnosis present

## 2024-05-10 DIAGNOSIS — Z7901 Long term (current) use of anticoagulants: Secondary | ICD-10-CM

## 2024-05-10 DIAGNOSIS — I4819 Other persistent atrial fibrillation: Secondary | ICD-10-CM | POA: Diagnosis present

## 2024-05-10 DIAGNOSIS — E1142 Type 2 diabetes mellitus with diabetic polyneuropathy: Secondary | ICD-10-CM | POA: Diagnosis present

## 2024-05-10 DIAGNOSIS — F17210 Nicotine dependence, cigarettes, uncomplicated: Secondary | ICD-10-CM | POA: Diagnosis present

## 2024-05-10 DIAGNOSIS — E8721 Acute metabolic acidosis: Secondary | ICD-10-CM | POA: Diagnosis not present

## 2024-05-10 DIAGNOSIS — Z833 Family history of diabetes mellitus: Secondary | ICD-10-CM

## 2024-05-10 DIAGNOSIS — K56600 Partial intestinal obstruction, unspecified as to cause: Secondary | ICD-10-CM | POA: Diagnosis not present

## 2024-05-10 DIAGNOSIS — Z01818 Encounter for other preprocedural examination: Secondary | ICD-10-CM

## 2024-05-10 DIAGNOSIS — F319 Bipolar disorder, unspecified: Secondary | ICD-10-CM | POA: Diagnosis present

## 2024-05-10 DIAGNOSIS — Z794 Long term (current) use of insulin: Secondary | ICD-10-CM

## 2024-05-10 DIAGNOSIS — E875 Hyperkalemia: Secondary | ICD-10-CM | POA: Diagnosis present

## 2024-05-10 DIAGNOSIS — E785 Hyperlipidemia, unspecified: Secondary | ICD-10-CM | POA: Diagnosis present

## 2024-05-10 DIAGNOSIS — M1711 Unilateral primary osteoarthritis, right knee: Secondary | ICD-10-CM

## 2024-05-10 DIAGNOSIS — J9811 Atelectasis: Secondary | ICD-10-CM | POA: Diagnosis present

## 2024-05-10 DIAGNOSIS — E119 Type 2 diabetes mellitus without complications: Secondary | ICD-10-CM | POA: Diagnosis not present

## 2024-05-10 DIAGNOSIS — I482 Chronic atrial fibrillation, unspecified: Secondary | ICD-10-CM | POA: Diagnosis present

## 2024-05-10 DIAGNOSIS — K219 Gastro-esophageal reflux disease without esophagitis: Secondary | ICD-10-CM | POA: Diagnosis present

## 2024-05-10 DIAGNOSIS — Z7984 Long term (current) use of oral hypoglycemic drugs: Secondary | ICD-10-CM

## 2024-05-10 DIAGNOSIS — E876 Hypokalemia: Secondary | ICD-10-CM | POA: Diagnosis not present

## 2024-05-10 DIAGNOSIS — G4733 Obstructive sleep apnea (adult) (pediatric): Secondary | ICD-10-CM | POA: Diagnosis present

## 2024-05-10 DIAGNOSIS — D631 Anemia in chronic kidney disease: Secondary | ICD-10-CM | POA: Diagnosis present

## 2024-05-10 DIAGNOSIS — I700 Atherosclerosis of aorta: Secondary | ICD-10-CM | POA: Diagnosis present

## 2024-05-10 DIAGNOSIS — I2510 Atherosclerotic heart disease of native coronary artery without angina pectoris: Secondary | ICD-10-CM

## 2024-05-10 DIAGNOSIS — I10 Essential (primary) hypertension: Secondary | ICD-10-CM

## 2024-05-10 DIAGNOSIS — E1165 Type 2 diabetes mellitus with hyperglycemia: Secondary | ICD-10-CM | POA: Diagnosis present

## 2024-05-10 DIAGNOSIS — Z96651 Presence of right artificial knee joint: Secondary | ICD-10-CM

## 2024-05-10 DIAGNOSIS — Z8249 Family history of ischemic heart disease and other diseases of the circulatory system: Secondary | ICD-10-CM

## 2024-05-10 DIAGNOSIS — I130 Hypertensive heart and chronic kidney disease with heart failure and stage 1 through stage 4 chronic kidney disease, or unspecified chronic kidney disease: Secondary | ICD-10-CM | POA: Diagnosis present

## 2024-05-10 DIAGNOSIS — Z96652 Presence of left artificial knee joint: Secondary | ICD-10-CM | POA: Diagnosis present

## 2024-05-10 DIAGNOSIS — J4489 Other specified chronic obstructive pulmonary disease: Secondary | ICD-10-CM | POA: Diagnosis present

## 2024-05-10 DIAGNOSIS — D509 Iron deficiency anemia, unspecified: Secondary | ICD-10-CM | POA: Diagnosis not present

## 2024-05-10 DIAGNOSIS — F419 Anxiety disorder, unspecified: Secondary | ICD-10-CM | POA: Diagnosis present

## 2024-05-10 DIAGNOSIS — G4710 Hypersomnia, unspecified: Secondary | ICD-10-CM | POA: Diagnosis present

## 2024-05-10 DIAGNOSIS — I4892 Unspecified atrial flutter: Secondary | ICD-10-CM | POA: Diagnosis present

## 2024-05-10 DIAGNOSIS — T433X5A Adverse effect of phenothiazine antipsychotics and neuroleptics, initial encounter: Secondary | ICD-10-CM | POA: Diagnosis present

## 2024-05-10 DIAGNOSIS — Z6831 Body mass index (BMI) 31.0-31.9, adult: Secondary | ICD-10-CM

## 2024-05-10 DIAGNOSIS — Z8673 Personal history of transient ischemic attack (TIA), and cerebral infarction without residual deficits: Secondary | ICD-10-CM

## 2024-05-10 DIAGNOSIS — J449 Chronic obstructive pulmonary disease, unspecified: Secondary | ICD-10-CM | POA: Diagnosis present

## 2024-05-10 DIAGNOSIS — G928 Other toxic encephalopathy: Secondary | ICD-10-CM | POA: Diagnosis present

## 2024-05-10 DIAGNOSIS — E860 Dehydration: Secondary | ICD-10-CM | POA: Diagnosis present

## 2024-05-10 DIAGNOSIS — I5032 Chronic diastolic (congestive) heart failure: Secondary | ICD-10-CM | POA: Diagnosis present

## 2024-05-10 DIAGNOSIS — N179 Acute kidney failure, unspecified: Secondary | ICD-10-CM | POA: Diagnosis not present

## 2024-05-10 DIAGNOSIS — Z79899 Other long term (current) drug therapy: Secondary | ICD-10-CM

## 2024-05-10 DIAGNOSIS — Z7985 Long-term (current) use of injectable non-insulin antidiabetic drugs: Secondary | ICD-10-CM

## 2024-05-10 DIAGNOSIS — Z7951 Long term (current) use of inhaled steroids: Secondary | ICD-10-CM

## 2024-05-10 HISTORY — PX: TOTAL KNEE ARTHROPLASTY: SHX125

## 2024-05-10 LAB — GLUCOSE, CAPILLARY
Glucose-Capillary: 190 mg/dL — ABNORMAL HIGH (ref 70–99)
Glucose-Capillary: 153 mg/dL — ABNORMAL HIGH (ref 70–99)
Glucose-Capillary: 144 mg/dL — ABNORMAL HIGH (ref 70–99)
Glucose-Capillary: 206 mg/dL — ABNORMAL HIGH (ref 70–99)
Glucose-Capillary: 262 mg/dL — ABNORMAL HIGH (ref 70–99)
Glucose-Capillary: 266 mg/dL — ABNORMAL HIGH (ref 70–99)

## 2024-05-10 LAB — ABO/RH: ABO/RH(D): A POS

## 2024-05-10 SURGERY — ARTHROPLASTY, KNEE, TOTAL
Anesthesia: General | Site: Knee | Laterality: Right

## 2024-05-10 MED ORDER — INSULIN ASPART 100 UNIT/ML IJ SOLN
INTRAMUSCULAR | Status: AC
  Administered 2024-05-10: 4 [IU] via SUBCUTANEOUS
  Filled 2024-05-10: qty 1

## 2024-05-10 MED ORDER — DIVALPROEX SODIUM ER 500 MG PO TB24
500.0000 mg | ORAL_TABLET | Freq: Every day | ORAL | Status: DC
  Administered 2024-05-11 – 2024-05-14 (×4): 500 mg via ORAL
  Filled 2024-05-10 (×4): qty 1

## 2024-05-10 MED ORDER — LIDOCAINE HCL (PF) 2 % IJ SOLN
INTRAMUSCULAR | Status: AC
  Filled 2024-05-10: qty 5

## 2024-05-10 MED ORDER — CEFAZOLIN SODIUM-DEXTROSE 2-4 GM/100ML-% IV SOLN
2.0000 g | Freq: Four times a day (QID) | INTRAVENOUS | Status: AC
  Administered 2024-05-10 (×2): 2 g via INTRAVENOUS
  Filled 2024-05-10 (×2): qty 100

## 2024-05-10 MED ORDER — ROCURONIUM BROMIDE 10 MG/ML (PF) SYRINGE
PREFILLED_SYRINGE | INTRAVENOUS | Status: DC | PRN
  Administered 2024-05-10: 60 mg via INTRAVENOUS

## 2024-05-10 MED ORDER — HYDROMORPHONE HCL 2 MG PO TABS
2.0000 mg | ORAL_TABLET | ORAL | Status: DC | PRN
  Administered 2024-05-10 – 2024-05-12 (×4): 2 mg via ORAL
  Filled 2024-05-10 (×4): qty 1

## 2024-05-10 MED ORDER — DIVALPROEX SODIUM ER 500 MG PO TB24: 500.0000 mg | ORAL_TABLET | ORAL | Status: DC

## 2024-05-10 MED ORDER — OXYBUTYNIN CHLORIDE 5 MG PO TABS
5.0000 mg | ORAL_TABLET | Freq: Two times a day (BID) | ORAL | Status: DC
  Administered 2024-05-10 – 2024-05-14 (×8): 5 mg via ORAL
  Filled 2024-05-10 (×8): qty 1

## 2024-05-10 MED ORDER — KETAMINE HCL 50 MG/5ML IJ SOSY
PREFILLED_SYRINGE | INTRAMUSCULAR | Status: AC
  Filled 2024-05-10: qty 5

## 2024-05-10 MED ORDER — ONDANSETRON HCL 4 MG/2ML IJ SOLN
INTRAMUSCULAR | Status: AC
  Filled 2024-05-10: qty 2

## 2024-05-10 MED ORDER — PHENOL 1.4 % MT LIQD: 1.0000 | OROMUCOSAL | Status: DC | PRN

## 2024-05-10 MED ORDER — BUPIVACAINE-EPINEPHRINE 0.25% -1:200000 IJ SOLN
INTRAMUSCULAR | Status: DC | PRN
  Administered 2024-05-10: 30 mL

## 2024-05-10 MED ORDER — TRANEXAMIC ACID-NACL 1000-0.7 MG/100ML-% IV SOLN
1000.0000 mg | INTRAVENOUS | Status: AC
  Administered 2024-05-10: 1000 mg via INTRAVENOUS
  Filled 2024-05-10: qty 100

## 2024-05-10 MED ORDER — OXYCODONE HCL 5 MG PO TABS: 5.0000 mg | ORAL_TABLET | ORAL | Status: DC | PRN

## 2024-05-10 MED ORDER — FERROUS SULFATE 325 (65 FE) MG PO TABS
325.0000 mg | ORAL_TABLET | Freq: Every day | ORAL | Status: DC
  Administered 2024-05-11 – 2024-05-14 (×4): 325 mg via ORAL
  Filled 2024-05-10 (×4): qty 1

## 2024-05-10 MED ORDER — MIDAZOLAM HCL (PF) 2 MG/2ML IJ SOLN
INTRAMUSCULAR | Status: DC | PRN
  Administered 2024-05-10: 2 mg via INTRAVENOUS

## 2024-05-10 MED ORDER — BUPIVACAINE-EPINEPHRINE (PF) 0.25% -1:200000 IJ SOLN
INTRAMUSCULAR | Status: AC
Start: 2024-05-10 — End: 2024-05-10
  Filled 2024-05-10: qty 30

## 2024-05-10 MED ORDER — METOCLOPRAMIDE HCL 5 MG/ML IJ SOLN: 5.0000 mg | Freq: Three times a day (TID) | INTRAMUSCULAR | Status: DC | PRN

## 2024-05-10 MED ORDER — INSULIN ASPART 100 UNIT/ML IJ SOLN
0.0000 [IU] | Freq: Every day | INTRAMUSCULAR | Status: DC
  Administered 2024-05-10: 3 [IU] via SUBCUTANEOUS
  Filled 2024-05-10: qty 3

## 2024-05-10 MED ORDER — FENTANYL CITRATE (PF) 50 MCG/ML IJ SOSY: 50.0000 ug | PREFILLED_SYRINGE | INTRAMUSCULAR | Status: DC

## 2024-05-10 MED ORDER — SODIUM CHLORIDE (PF) 0.9 % IJ SOLN
INTRAMUSCULAR | Status: AC
  Filled 2024-05-10: qty 10

## 2024-05-10 MED ORDER — HYDROMORPHONE HCL 1 MG/ML IJ SOLN: 0.5000 mg | INTRAMUSCULAR | Status: DC | PRN

## 2024-05-10 MED ORDER — DIVALPROEX SODIUM ER 500 MG PO TB24
1000.0000 mg | ORAL_TABLET | Freq: Every day | ORAL | Status: DC
  Administered 2024-05-10 – 2024-05-13 (×4): 1000 mg via ORAL
  Filled 2024-05-10 (×4): qty 2

## 2024-05-10 MED ORDER — ROPIVACAINE HCL 5 MG/ML IJ SOLN
INTRAMUSCULAR | Status: DC | PRN
  Administered 2024-05-10: 20 mL via PERINEURAL

## 2024-05-10 MED ORDER — PROPOFOL 10 MG/ML IV BOLUS
INTRAVENOUS | Status: DC | PRN
  Administered 2024-05-10: 100 mg via INTRAVENOUS

## 2024-05-10 MED ORDER — HYDROMORPHONE HCL 1 MG/ML IJ SOLN
INTRAMUSCULAR | Status: DC | PRN
  Administered 2024-05-10 (×2): .4 mg via INTRAVENOUS

## 2024-05-10 MED ORDER — LACTATED RINGERS IV SOLN: INTRAVENOUS | Status: DC

## 2024-05-10 MED ORDER — ACETAMINOPHEN 500 MG PO TABS
1000.0000 mg | ORAL_TABLET | Freq: Once | ORAL | Status: AC
  Administered 2024-05-10: 1000 mg via ORAL
  Filled 2024-05-10: qty 2

## 2024-05-10 MED ORDER — AMISULPRIDE (ANTIEMETIC) 5 MG/2ML IV SOLN: 10.0000 mg | Freq: Once | INTRAVENOUS | Status: DC | PRN

## 2024-05-10 MED ORDER — PHENYLEPHRINE HCL-NACL 20-0.9 MG/250ML-% IV SOLN
INTRAVENOUS | Status: DC | PRN
  Administered 2024-05-10: 50 ug/min via INTRAVENOUS

## 2024-05-10 MED ORDER — HYDROMORPHONE HCL 1 MG/ML IJ SOLN: 0.2500 mg | INTRAMUSCULAR | Status: DC | PRN

## 2024-05-10 MED ORDER — MIDAZOLAM HCL (PF) 2 MG/2ML IJ SOLN: 1.0000 mg | INTRAMUSCULAR | Status: DC

## 2024-05-10 MED ORDER — INSULIN GLARGINE-YFGN 100 UNIT/ML ~~LOC~~ SOLN
20.0000 [IU] | Freq: Two times a day (BID) | SUBCUTANEOUS | Status: DC
Start: 2024-05-10 — End: 2024-05-14
  Administered 2024-05-10 – 2024-05-14 (×7): 20 [IU] via SUBCUTANEOUS
  Filled 2024-05-10 (×9): qty 0.2

## 2024-05-10 MED ORDER — BUPROPION HCL ER (SR) 150 MG PO TB12
150.0000 mg | ORAL_TABLET | Freq: Two times a day (BID) | ORAL | Status: DC
  Administered 2024-05-10 – 2024-05-14 (×8): 150 mg via ORAL
  Filled 2024-05-10 (×8): qty 1

## 2024-05-10 MED ORDER — ORAL CARE MOUTH RINSE: 15.0000 mL | Freq: Once | OROMUCOSAL | Status: AC

## 2024-05-10 MED ORDER — DOCUSATE SODIUM 100 MG PO CAPS
100.0000 mg | ORAL_CAPSULE | Freq: Two times a day (BID) | ORAL | Status: DC
  Administered 2024-05-10 – 2024-05-14 (×8): 100 mg via ORAL
  Filled 2024-05-10 (×8): qty 1

## 2024-05-10 MED ORDER — MENTHOL 3 MG MT LOZG: 1.0000 | LOZENGE | OROMUCOSAL | Status: DC | PRN

## 2024-05-10 MED ORDER — TIZANIDINE HCL 4 MG PO TABS: 4.0000 mg | ORAL_TABLET | Freq: Four times a day (QID) | ORAL | Status: DC | PRN

## 2024-05-10 MED ORDER — HYDROMORPHONE HCL 2 MG/ML IJ SOLN
INTRAMUSCULAR | Status: AC
  Filled 2024-05-10: qty 1

## 2024-05-10 MED ORDER — CEFAZOLIN SODIUM-DEXTROSE 2-4 GM/100ML-% IV SOLN: 2.0000 g | INTRAVENOUS | Status: DC

## 2024-05-10 MED ORDER — OXYCODONE HCL 5 MG/5ML PO SOLN: 5.0000 mg | Freq: Once | ORAL | Status: DC | PRN

## 2024-05-10 MED ORDER — METOPROLOL TARTRATE 50 MG PO TABS
50.0000 mg | ORAL_TABLET | Freq: Two times a day (BID) | ORAL | Status: DC
Start: 2024-05-10 — End: 2024-05-14
  Administered 2024-05-10 – 2024-05-14 (×8): 50 mg via ORAL
  Filled 2024-05-10 (×8): qty 1

## 2024-05-10 MED ORDER — PHENYLEPHRINE 80 MCG/ML (10ML) SYRINGE FOR IV PUSH (FOR BLOOD PRESSURE SUPPORT)
PREFILLED_SYRINGE | INTRAVENOUS | Status: AC
Start: 2024-05-10 — End: 2024-05-10
  Filled 2024-05-10: qty 10

## 2024-05-10 MED ORDER — AMLODIPINE BESYLATE 5 MG PO TABS
5.0000 mg | ORAL_TABLET | Freq: Every day | ORAL | Status: DC
  Administered 2024-05-10 – 2024-05-14 (×5): 5 mg via ORAL
  Filled 2024-05-10 (×5): qty 1

## 2024-05-10 MED ORDER — ROSUVASTATIN CALCIUM 10 MG PO TABS
10.0000 mg | ORAL_TABLET | Freq: Every day | ORAL | Status: DC
Start: 2024-05-10 — End: 2024-05-14
  Administered 2024-05-10 – 2024-05-14 (×5): 10 mg via ORAL
  Filled 2024-05-10 (×5): qty 1

## 2024-05-10 MED ORDER — ACETAMINOPHEN 325 MG PO TABS
325.0000 mg | ORAL_TABLET | Freq: Four times a day (QID) | ORAL | Status: DC | PRN
  Administered 2024-05-12 – 2024-05-13 (×3): 650 mg via ORAL
  Filled 2024-05-10 (×5): qty 2

## 2024-05-10 MED ORDER — OXYCODONE HCL 5 MG PO TABS: 5.0000 mg | ORAL_TABLET | Freq: Once | ORAL | Status: DC | PRN

## 2024-05-10 MED ORDER — SUGAMMADEX SODIUM 200 MG/2ML IV SOLN
INTRAVENOUS | Status: DC | PRN
  Administered 2024-05-10: 250 mg via INTRAVENOUS

## 2024-05-10 MED ORDER — STERILE WATER FOR IRRIGATION IR SOLN
Status: DC | PRN
  Administered 2024-05-10: 2000 mL

## 2024-05-10 MED ORDER — PROPOFOL 10 MG/ML IV BOLUS
INTRAVENOUS | Status: AC
  Filled 2024-05-10: qty 20

## 2024-05-10 MED ORDER — ALUM & MAG HYDROXIDE-SIMETH 200-200-20 MG/5ML PO SUSP: 30.0000 mL | ORAL | Status: DC | PRN

## 2024-05-10 MED ORDER — METOCLOPRAMIDE HCL 5 MG PO TABS: 5.0000 mg | ORAL_TABLET | Freq: Three times a day (TID) | ORAL | Status: DC | PRN

## 2024-05-10 MED ORDER — 0.9 % SODIUM CHLORIDE (POUR BTL) OPTIME
TOPICAL | Status: DC | PRN
  Administered 2024-05-10: 1000 mL

## 2024-05-10 MED ORDER — CHLORHEXIDINE GLUCONATE 0.12 % MT SOLN
15.0000 mL | Freq: Once | OROMUCOSAL | Status: AC
  Administered 2024-05-10: 15 mL via OROMUCOSAL

## 2024-05-10 MED ORDER — ONDANSETRON HCL 4 MG PO TABS: 4.0000 mg | ORAL_TABLET | Freq: Four times a day (QID) | ORAL | Status: DC | PRN

## 2024-05-10 MED ORDER — INSULIN ASPART 100 UNIT/ML IJ SOLN
0.0000 [IU] | Freq: Three times a day (TID) | INTRAMUSCULAR | Status: DC
  Administered 2024-05-10 – 2024-05-11 (×2): 8 [IU] via SUBCUTANEOUS
  Administered 2024-05-11: 3 [IU] via SUBCUTANEOUS
  Administered 2024-05-11: 8 [IU] via SUBCUTANEOUS
  Administered 2024-05-12: 3 [IU] via SUBCUTANEOUS
  Administered 2024-05-12 – 2024-05-13 (×4): 2 [IU] via SUBCUTANEOUS
  Administered 2024-05-14 (×2): 3 [IU] via SUBCUTANEOUS
  Filled 2024-05-10: qty 3

## 2024-05-10 MED ORDER — PHENYLEPHRINE 80 MCG/ML (10ML) SYRINGE FOR IV PUSH (FOR BLOOD PRESSURE SUPPORT)
PREFILLED_SYRINGE | INTRAVENOUS | Status: DC | PRN
  Administered 2024-05-10: 160 ug via INTRAVENOUS

## 2024-05-10 MED ORDER — SUGAMMADEX SODIUM 200 MG/2ML IV SOLN
INTRAVENOUS | Status: AC
  Filled 2024-05-10: qty 4

## 2024-05-10 MED ORDER — DIPHENHYDRAMINE HCL 12.5 MG/5ML PO ELIX: 12.5000 mg | ORAL_SOLUTION | ORAL | Status: DC | PRN

## 2024-05-10 MED ORDER — CLONIDINE HCL (ANALGESIA) 100 MCG/ML EP SOLN
EPIDURAL | Status: DC | PRN
  Administered 2024-05-10: 50 ug

## 2024-05-10 MED ORDER — INSULIN ASPART 100 UNIT/ML IJ SOLN
INTRAMUSCULAR | Status: AC
  Filled 2024-05-10: qty 1

## 2024-05-10 MED ORDER — FENTANYL CITRATE (PF) 250 MCG/5ML IJ SOLN
INTRAMUSCULAR | Status: AC
  Filled 2024-05-10: qty 5

## 2024-05-10 MED ORDER — INSULIN ASPART 100 UNIT/ML IJ SOLN
0.0000 [IU] | INTRAMUSCULAR | Status: AC | PRN
  Administered 2024-05-10 (×2): 2 [IU] via SUBCUTANEOUS
  Filled 2024-05-10: qty 1

## 2024-05-10 MED ORDER — POVIDONE-IODINE 10 % EX SWAB: 2.0000 | Freq: Once | CUTANEOUS | Status: DC

## 2024-05-10 MED ORDER — PANTOPRAZOLE SODIUM 40 MG PO TBEC
40.0000 mg | DELAYED_RELEASE_TABLET | Freq: Every day | ORAL | Status: DC
  Administered 2024-05-11 – 2024-05-14 (×4): 40 mg via ORAL
  Filled 2024-05-10 (×4): qty 1

## 2024-05-10 MED ORDER — ONDANSETRON HCL 4 MG/2ML IJ SOLN
INTRAMUSCULAR | Status: DC | PRN
  Administered 2024-05-10: 4 mg via INTRAVENOUS

## 2024-05-10 MED ORDER — FENTANYL CITRATE (PF) 250 MCG/5ML IJ SOLN
INTRAMUSCULAR | Status: DC | PRN
  Administered 2024-05-10 (×5): 50 ug via INTRAVENOUS

## 2024-05-10 MED ORDER — FLUTICASONE FUROATE-VILANTEROL 100-25 MCG/ACT IN AEPB
1.0000 | INHALATION_SPRAY | Freq: Every day | RESPIRATORY_TRACT | Status: DC
  Filled 2024-05-10: qty 28

## 2024-05-10 MED ORDER — KETAMINE HCL 50 MG/5ML IJ SOSY
PREFILLED_SYRINGE | INTRAMUSCULAR | Status: DC | PRN
  Administered 2024-05-10: 20 mg via INTRAVENOUS
  Administered 2024-05-10: 30 mg via INTRAVENOUS

## 2024-05-10 MED ORDER — DEXAMETHASONE SOD PHOSPHATE PF 10 MG/ML IJ SOLN
INTRAMUSCULAR | Status: DC | PRN
  Administered 2024-05-10: 10 mg via INTRAVENOUS

## 2024-05-10 MED ORDER — MIDAZOLAM HCL 2 MG/2ML IJ SOLN
INTRAMUSCULAR | Status: AC
  Filled 2024-05-10: qty 2

## 2024-05-10 MED ORDER — SODIUM CHLORIDE 0.9 % IR SOLN
Status: DC | PRN
  Administered 2024-05-10: 1000 mL

## 2024-05-10 MED ORDER — METFORMIN HCL 500 MG PO TABS
1000.0000 mg | ORAL_TABLET | Freq: Two times a day (BID) | ORAL | Status: DC
  Administered 2024-05-11 – 2024-05-14 (×6): 1000 mg via ORAL
  Filled 2024-05-10 (×6): qty 2

## 2024-05-10 MED ORDER — LIDOCAINE HCL (CARDIAC) PF 100 MG/5ML IV SOSY
PREFILLED_SYRINGE | INTRAVENOUS | Status: DC | PRN
  Administered 2024-05-10: 40 mg via INTRAVENOUS

## 2024-05-10 MED ORDER — CEFAZOLIN SODIUM-DEXTROSE 2-4 GM/100ML-% IV SOLN
2.0000 g | INTRAVENOUS | Status: AC
  Administered 2024-05-10: 2 g via INTRAVENOUS
  Filled 2024-05-10: qty 100

## 2024-05-10 MED ORDER — RIVAROXABAN 10 MG PO TABS
20.0000 mg | ORAL_TABLET | Freq: Every day | ORAL | Status: DC
  Administered 2024-05-11 – 2024-05-13 (×3): 20 mg via ORAL
  Filled 2024-05-10 (×3): qty 2

## 2024-05-10 MED ORDER — ONDANSETRON HCL 4 MG/2ML IJ SOLN
4.0000 mg | Freq: Four times a day (QID) | INTRAMUSCULAR | Status: DC | PRN
  Administered 2024-05-13: 4 mg via INTRAVENOUS
  Filled 2024-05-10: qty 2

## 2024-05-10 MED ORDER — ROCURONIUM BROMIDE 10 MG/ML (PF) SYRINGE
PREFILLED_SYRINGE | INTRAVENOUS | Status: AC
  Filled 2024-05-10: qty 10

## 2024-05-10 MED ORDER — PROPOFOL 1000 MG/100ML IV EMUL
INTRAVENOUS | Status: AC
  Filled 2024-05-10: qty 100

## 2024-05-10 SURGICAL SUPPLY — 50 items
BAG COUNTER SPONGE SURGICOUNT (BAG) IMPLANT
BAG ZIPLOCK 12X15 (MISCELLANEOUS) ×1 IMPLANT
BENZOIN TINCTURE PRP APPL 2/3 (GAUZE/BANDAGES/DRESSINGS) IMPLANT
BLADE SAG 18X100X1.27 (BLADE) ×1 IMPLANT
BNDG ELASTIC 6INX 5YD STR LF (GAUZE/BANDAGES/DRESSINGS) ×2 IMPLANT
BOWL SMART MIX CTS (DISPOSABLE) IMPLANT
CEMENT BONE R 1X40 (Cement) IMPLANT
COMPONENT FEM CMT PRNSA SZ10RT (Joint) IMPLANT
COOLER ICEMAN CLASSIC (MISCELLANEOUS) ×1 IMPLANT
COVER SURGICAL LIGHT HANDLE (MISCELLANEOUS) ×1 IMPLANT
CUFF TRNQT CYL 34X4.125X (TOURNIQUET CUFF) ×1 IMPLANT
DRAPE U-SHAPE 47X51 STRL (DRAPES) ×1 IMPLANT
DURAPREP 26ML APPLICATOR (WOUND CARE) ×1 IMPLANT
ELECT BLADE TIP CTD 4 INCH (ELECTRODE) ×1 IMPLANT
ELECT PENCIL ROCKER SW 15FT (MISCELLANEOUS) ×1 IMPLANT
ELECT REM PT RETURN 15FT ADLT (MISCELLANEOUS) ×1 IMPLANT
GAUZE PAD ABD 8X10 STRL (GAUZE/BANDAGES/DRESSINGS) ×2 IMPLANT
GAUZE SPONGE 4X4 12PLY STRL (GAUZE/BANDAGES/DRESSINGS) ×1 IMPLANT
GAUZE XEROFORM 1X8 LF (GAUZE/BANDAGES/DRESSINGS) IMPLANT
GLOVE BIO SURGEON STRL SZ7.5 (GLOVE) ×1 IMPLANT
GLOVE BIOGEL PI IND STRL 8 (GLOVE) ×2 IMPLANT
GLOVE ECLIPSE 8.0 STRL XLNG CF (GLOVE) ×1 IMPLANT
GOWN STRL REUS W/ TWL XL LVL3 (GOWN DISPOSABLE) ×2 IMPLANT
HOLDER FOLEY CATH W/STRAP (MISCELLANEOUS) IMPLANT
IMMOBILIZER KNEE 20 THIGH 36 (SOFTGOODS) ×1 IMPLANT
INSERT TIB CMT PS G 0 RT (Insert) IMPLANT
INSERT TIBIAL PERSONA 10 RT (Insert) IMPLANT
KIT TURNOVER KIT A (KITS) ×1 IMPLANT
MANIFOLD NEPTUNE II (INSTRUMENTS) ×1 IMPLANT
NS IRRIG 1000ML POUR BTL (IV SOLUTION) ×1 IMPLANT
PACK TOTAL KNEE CUSTOM (KITS) ×1 IMPLANT
PAD COLD SHLDR WRAP-ON (PAD) ×1 IMPLANT
PADDING CAST COTTON 6X4 STRL (CAST SUPPLIES) ×2 IMPLANT
PIN DRILL HDLS TROCAR 75 4PK (PIN) IMPLANT
PROTECTOR NERVE ULNAR (MISCELLANEOUS) ×1 IMPLANT
SCREW FEMALE HEX FIX 25X2.5 (ORTHOPEDIC DISPOSABLE SUPPLIES) IMPLANT
SET HNDPC FAN SPRY TIP SCT (DISPOSABLE) ×1 IMPLANT
SET PAD KNEE POSITIONER (MISCELLANEOUS) ×1 IMPLANT
SPIKE FLUID TRANSFER (MISCELLANEOUS) IMPLANT
STAPLER SKIN PROX 35W (STAPLE) IMPLANT
STEM POLY PAT PLY 32M KNEE (Knees) IMPLANT
STRIP CLOSURE SKIN 1/2X4 (GAUZE/BANDAGES/DRESSINGS) IMPLANT
SUT MNCRL AB 4-0 PS2 18 (SUTURE) IMPLANT
SUT VIC AB 0 CT1 36 (SUTURE) ×1 IMPLANT
SUT VIC AB 1 CT1 36 (SUTURE) ×2 IMPLANT
SUT VIC AB 2-0 CT1 TAPERPNT 27 (SUTURE) ×2 IMPLANT
TOWEL GREEN STERILE FF (TOWEL DISPOSABLE) ×1 IMPLANT
TRAY FOLEY MTR SLVR 16FR STAT (SET/KITS/TRAYS/PACK) IMPLANT
WATER STERILE IRR 1000ML POUR (IV SOLUTION) ×2 IMPLANT
YANKAUER SUCT BULB TIP NO VENT (SUCTIONS) ×1 IMPLANT

## 2024-05-10 NOTE — Interval H&P Note (Signed)
 History and Physical Interval Note: The patient understands that she is here today for a right total knee replacement to treat her significant right knee pain and arthritis.  There has been no acute or interval change in her medical status.  The risks and benefits of surgery have been discussed in detail and informed consent has been obtained.  The right operative knee has been marked.  05/10/2024 6:57 AM  Rhonda Hickman  has presented today for surgery, with the diagnosis of osteoarthritis right knee.  The various methods of treatment have been discussed with the patient and family. After consideration of risks, benefits and other options for treatment, the patient has consented to  Procedure(s): ARTHROPLASTY, KNEE, TOTAL (Right) as a surgical intervention.  The patient's history has been reviewed, patient examined, no change in status, stable for surgery.  I have reviewed the patient's chart and labs.  Questions were answered to the patient's satisfaction.     [name]

## 2024-05-10 NOTE — Op Note (Signed)
 Operative Note  Date of operation: 05/10/2024 Preoperative diagnosis: Right knee primary osteoarthritis Postoperative diagnosis: Same  Procedure: Right cemented total knee arthroplasty  Implants: Biomet/Zimmer persona cemented knee system Implant Name Type Inv. Item Serial No. Manufacturer Lot No. LRB No. Used Action  CEMENT BONE R 1X40 - [id number] Cement CEMENT BONE R 1X40  ZIMMER RECON(ORTH,TRAU,BIO,SG) [id number] Right 2 Implanted  STEM POLY PAT PLY 60M KNEE - [id number] Knees STEM POLY PAT PLY 60M KNEE  ZIMMER RECON(ORTH,TRAU,BIO,SG) [id number] Right 1 Implanted  INSERT TIBIAL PERSONA 10 RT - [id number] Insert INSERT TIBIAL PERSONA 10 RT  ZIMMER RECON(ORTH,TRAU,BIO,SG) [id number] Right 1 Implanted  INSERT TIB CMT PS G 0 RT - [id number] Insert INSERT TIB CMT PS G 0 RT  ZIMMER RECON(ORTH,TRAU,BIO,SG) [id number] Right 1 Implanted  COMPONENT FEM CMT PRNSA [id number] - [id number] Joint COMPONENT FEM CMT PRNSA SZ10RT  ZIMMER RECON(ORTH,TRAU,BIO,SG) [id number] Right 1 Implanted   Surgeon: Lonni GRADE. [place], MD Assistant: Tory Gaskins, PA-C  Anesthesia: #1 right lower extremity adductor canal block, #2 General, #3 local Tourniquet time: Under 1 hour EBL: Less than 50 cc Antibiotics: IV Ancef  Complications: None  Indications: The patient is an active 68 year old female with debilitating arthritis involving her right knee this been well-documented clinical exam and x-ray findings.  She has tried and failed all forms of conservative treatment.  At this point her right knee pain is daily and it is detrimentally affecting her mobility, her quality of life and actives daily living.  She has had a knee replaced on the left such as she is fully aware of the risk and benefits of the surgery involved.  Procedure description: After informed consent was obtained and the proper right knee was marked, anesthesia obtained a right lower extremity adductor canal block in the holding room.  The patient was then  brought to the operating room and placed upon on the operative table general anesthesia was obtained.  A Foley catheter was placed.  A nonsterile traction was next placed around her upper right thigh and her right thigh, knee, leg, ankle and foot were prepped and draped in DuraPrep and sterile drapes including a sterile stockinette.  A timeout was called and she was identified as the correct patient the correct right knee.  An Esmarch was then used to wrap out the leg and the tourniquet was inflated to 300 mm of pressure.  With the knee extended to direct manage incision was made over the patella and carried proximally distally.  Dissection was carried down to the knee joint and a medial parapatellar arthrotomy is made.  With the knee in a flexed position we found complete cartilage loss on multiple areas of the knee especially the medial weightbearing surface.  We removed osteophytes from all 3 compartments as well as the ACL and medial and lateral meniscus.  We then used an extramedullary based cutting guide for making her proximal tibia cut correcting her varus and valgus and a 7 degree slope.  We made this cut to take 2 mm off the low side and we did back it down to more millimeters.  We then used an instrument to the base cutting guide for distal femur cut setting this for a right knee at 5 degrees x-rays rotated and this was for a 10 mm distal femoral cut.  We made that cut without difficulty and brought the knee back down to full extension and had achieved full extension with a 10 mm extension block.  We then impacted the femur and put the  femoral sizing guide based off the epicondylar axis and Whitesides line.  Based off of this we chose a size 10 femur.  We put a 4-in-1 cutting block for a size 10 femur and made our anterior and posterior cuts followed by her chamfer cuts.  We then impacted the tibia and chose a size G right tibial tray for coverage of the tibial plateau setting the rotation of the tibial  tubercle on the femur.  We did our drill hole and keel punch off of this.  We then trialed a size G right tibia combined with our size 10 right CR standard femur.  We trialed a 10 mm thickness medial congruent right polythene insert we are pleased with range of motion and stability without insert.  We then made a patella cut and drilled 3 holes for a size 32 patella button.  With all trial instrumentation of the knee we are again pleased with range of motion and stability.  All trial instrumentation was then removed from the knee and the knee was irrigated with normal saline solution using pulsatile lavage.  We then placed Marcaine  with epinephrine  around the arthrotomy.  Next with the knee flexed we cemented our Biomet/Zimmer persona tibial tray for right knee size G followed by cementing our size 10 right CR standard femur.  We removed excess cement debris from the knee and placed our 10 mm thickness right medial congruent path and insert.  We cemented our size 32 patella button.  We then held the knee fully extended and compressed while the cemented hardened.  Once it hardened the tourniquet was let down and hemostasis was obtained with electrocautery.  The arthrotomy was then closed with interrupted #1 Vicryl suture followed by 0 Vicryl close deep tissue and 2-0 Vicryl to close subcutaneous tissue.  The skin was closed with staples.  Well-padded sterile dressing was applied.  The patient was awakened, extubated and taken the recovery room.  Tory Gaskins, PA-C did assist during the entire case and beginning then his assistance was crucial and medically necessary for soft tissue management and retraction, helping guide implant placement and a layered closure of the wound.

## 2024-05-10 NOTE — Transfer of Care (Signed)
 Immediate Anesthesia Transfer of Care Note  Patient: Rhonda Hickman  Procedure(s) Performed: ARTHROPLASTY, KNEE, TOTAL (Right: Knee)  Patient Location: PACU  Anesthesia Type:General  Level of Consciousness: drowsy  Airway & Oxygen  Therapy: Patient Spontanous Breathing and Patient connected to face mask oxygen   Post-op Assessment: Report given to RN and Post -op Vital signs reviewed and stable  Post vital signs: Reviewed and stable  Last Vitals:  Vitals Value Taken Time  BP    Temp    Pulse 84 05/10/24 10:29  Resp 17 05/10/24 10:29  SpO2 98 % 05/10/24 10:29  Vitals shown include unfiled device data.  Last Pain:  Vitals:   05/10/24 0709  TempSrc:   PainSc: 0-No pain         Complications: No notable events documented.

## 2024-05-10 NOTE — Telephone Encounter (Signed)
 Copied from CRM [id number]. Topic: General - Other >> May 10, 2024  2:11 PM Everette C wrote: Reason for CRM: Dorthea with [facility] Has called to follow up on a previously submitted letter requesting a letter of medical necessity from the patient's PCP for incontinence supplies  Please call [phone] further when possible to discuss  Please fax letter to [phone] if that's acceptable

## 2024-05-10 NOTE — Telephone Encounter (Signed)
 Will forward to provider

## 2024-05-10 NOTE — Progress Notes (Signed)
 PT Cancellation Note  Patient Details Name: Rhonda Hickman MRN: [id number] DOB: Apr 05, 1956   Cancelled Treatment:    Reason Eval/Treat Not Completed: Fatigue/lethargy limiting ability to participate x 2 attempts today. This PT arrived 1717 and pt able to rouse but remains in appropriate for participation with PT at this time. PT anticipates eval 11/1. PT to continue to follow acutely.   [place], PT Acute Rehab   [place] VEAR Drone 05/10/2024, 5:26 PM

## 2024-05-10 NOTE — Plan of Care (Signed)
  Problem: Activity: Goal: Risk for activity intolerance will decrease Outcome: Progressing   Problem: Coping: Goal: Level of anxiety will decrease Outcome: Progressing   Problem: Pain Managment: Goal: General experience of comfort will improve and/or be controlled Outcome: Progressing   Problem: Activity: Goal: Ability to avoid complications of mobility impairment will improve Outcome: Progressing Goal: Range of joint motion will improve Outcome: Progressing   Problem: Clinical Measurements: Goal: Postoperative complications will be avoided or minimized Outcome: Progressing

## 2024-05-10 NOTE — Anesthesia Procedure Notes (Signed)
 Anesthesia Regional Block: Adductor canal block   Pre-Anesthetic Checklist: , timeout performed,  Correct Patient, Correct Site, Correct Laterality,  Correct Procedure, Correct Position, site marked,  Risks and benefits discussed,  Surgical consent,  Pre-op evaluation,  At surgeon's request and post-op pain management  Laterality: Right  Prep: chloraprep       Needles:  Injection technique: Single-shot  Needle Type: Echogenic Needle     Needle Length: 9cm  Needle Gauge: 21     Additional Needles:   Procedures:,,,, ultrasound used (permanent image in chart),,    Narrative:  Start time: 05/10/2024 7:52 AM End time: 05/10/2024 7:59 AM Injection made incrementally with aspirations every 5 mL.  Performed by: Personally  Anesthesiologist: Epifanio Charleston, MD

## 2024-05-10 NOTE — Anesthesia Postprocedure Evaluation (Signed)
 Anesthesia Post Note  Patient: Rhonda Hickman  Procedure(s) Performed: ARTHROPLASTY, KNEE, TOTAL (Right: Knee)     Patient location during evaluation: PACU Anesthesia Type: General Level of consciousness: awake and alert Pain management: pain level controlled Vital Signs Assessment: post-procedure vital signs reviewed and stable Respiratory status: spontaneous breathing, nonlabored ventilation, respiratory function stable and patient connected to nasal cannula oxygen  Cardiovascular status: blood pressure returned to baseline and stable Postop Assessment: no apparent nausea or vomiting Anesthetic complications: no   No notable events documented.  Last Vitals:  Vitals:   05/10/24 1151 05/10/24 1410  BP: (!) 157/87 (!) 165/82  Pulse: 75 73  Resp: 18 18  Temp: (!) 36.4 C 36.8 C  SpO2: 98% 99%    Last Pain:  Vitals:   05/10/24 1151  TempSrc: Oral  PainSc:                  Helaina Stefano E

## 2024-05-10 NOTE — Anesthesia Procedure Notes (Signed)
 Procedure Name: Intubation Date/Time: 05/10/2024 8:44 AM  Performed by: Therisa Doyal CROME, CRNAPre-anesthesia Checklist: Patient identified, Emergency Drugs available, Suction available and Patient being monitored Patient Re-evaluated:Patient Re-evaluated prior to induction Oxygen  Delivery Method: Circle system utilized Preoxygenation: Pre-oxygenation with 100% oxygen  Induction Type: IV induction Ventilation: Mask ventilation without difficulty and Oral airway inserted - appropriate to patient size Laryngoscope Size: Miller and 3 Grade View: Grade I Tube type: Oral Tube size: 7.5 mm Number of attempts: 1 Airway Equipment and Method: Stylet Placement Confirmation: ETT inserted through vocal cords under direct vision, positive ETCO2 and breath sounds checked- equal and bilateral Secured at: 22 cm Tube secured with: Tape Dental Injury: Teeth and Oropharynx as per pre-operative assessment

## 2024-05-11 LAB — CBC
HCT: 32.1 % — ABNORMAL LOW (ref 36.0–46.0)
Hemoglobin: 9.8 g/dL — ABNORMAL LOW (ref 12.0–15.0)
MCH: 25.1 pg — ABNORMAL LOW (ref 26.0–34.0)
MCHC: 30.5 g/dL (ref 30.0–36.0)
MCV: 82.3 fL (ref 80.0–100.0)
Platelets: 269 K/uL (ref 150–400)
RBC: 3.9 MIL/uL (ref 3.87–5.11)
RDW: 18.2 % — ABNORMAL HIGH (ref 11.5–15.5)
WBC: 11.4 K/uL — ABNORMAL HIGH (ref 4.0–10.5)
nRBC: 0 % (ref 0.0–0.2)

## 2024-05-11 LAB — BASIC METABOLIC PANEL WITH GFR
Anion gap: 8 (ref 5–15)
BUN: 17 mg/dL (ref 8–23)
CO2: 26 mmol/L (ref 22–32)
Calcium: 9 mg/dL (ref 8.9–10.3)
Chloride: 102 mmol/L (ref 98–111)
Creatinine, Ser: 0.9 mg/dL (ref 0.44–1.00)
GFR, Estimated: >60 mL/min (ref >60)
Glucose, Bld: 234 mg/dL — ABNORMAL HIGH (ref 70–99)
Potassium: 4.9 mmol/L (ref 3.5–5.1)
Sodium: 136 mmol/L (ref 135–145)

## 2024-05-11 LAB — GLUCOSE, CAPILLARY
Glucose-Capillary: 276 mg/dL — ABNORMAL HIGH (ref 70–99)
Glucose-Capillary: 254 mg/dL — ABNORMAL HIGH (ref 70–99)
Glucose-Capillary: 185 mg/dL — ABNORMAL HIGH (ref 70–99)
Glucose-Capillary: 166 mg/dL — ABNORMAL HIGH (ref 70–99)

## 2024-05-11 NOTE — Progress Notes (Signed)
 Physical Therapy Treatment Patient Details Name: Rhonda Hickman MRN: [id number] DOB: [birth year] Today's Date: 05/11/2024   History of Present Illness Pt s/p R TKR and with hx o fL TKR, CAD, CVA, bipolar, afib and neuropathy    PT Comments  Pt cooperative but requiring increased time, increased cues and significant assist to move to EOB sitting, to standing and to step pvt with RW for chair to be pulled up behind.  Pt limited by c/o pain and elevated anxiety level.    If plan is discharge home, recommend the following: Two people to help with walking and/or transfers;Two people to help with bathing/dressing/bathroom;Assistance with cooking/housework;Assist for transportation;Help with stairs or ramp for entrance   Can travel by private vehicle     No  Equipment Recommendations  None recommended by PT    Recommendations for Other Services       Precautions / Restrictions Precautions Precautions: Fall;Knee Required Braces or Orthoses: Knee Immobilizer - Right Knee Immobilizer - Right: Discontinue once straight leg raise with < 10 degree lag Restrictions Weight Bearing Restrictions Per Provider Order: Yes RLE Weight Bearing Per Provider Order: Weight bearing as tolerated     Mobility  Bed Mobility Overal bed mobility: Needs Assistance Bed Mobility: Supine to Sit     Supine to sit: Mod assist, +2 for physical assistance, +2 for safety/equipment, HOB elevated, Used rails     General bed mobility comments: Increased time with HOB elevated and assist to manage R LE and to control trunk.    Transfers Overall transfer level: Needs assistance Equipment used: Rolling walker (2 wheels) Transfers: Sit to/from Stand, Bed to chair/wheelchair/BSC Sit to Stand: Mod assist, Max assist, +2 safety/equipment, From elevated surface, +2 physical assistance   Step pivot transfers: Mod assist, +2 physical assistance, +2 safety/equipment, From elevated surface       General transfer  comment: Increased time with cues for LE management and use of UEs to self assist; step pvt bed to recliner    Ambulation/Gait         Gait velocity: decr     General Gait Details: Pt tolerated Step pvt bed to recliner only; limited by pain and unsafe to attempt further   Stairs             Wheelchair Mobility     Tilt Bed    Modified Rankin (Stroke Patients Only)       Balance Overall balance assessment: Needs assistance Sitting-balance support: No upper extremity supported, Feet supported Sitting balance-Leahy Scale: Fair     Standing balance support: Bilateral upper extremity supported Standing balance-Leahy Scale: Poor                              Communication Communication Communication: Impaired Factors Affecting Communication: Reduced clarity of speech  Cognition Arousal: Alert Behavior During Therapy: Restless, Impulsive, Anxious   PT - Cognitive impairments: History of cognitive impairments                         Following commands: Intact      Cueing Cueing Techniques: Verbal cues, Gestural cues  Exercises Total Joint Exercises Ankle Circles/Pumps: AROM, Both, 15 reps, Supine Quad Sets: AAROM, Both, 10 reps, Supine Heel Slides: AAROM, Right, 15 reps, Supine Straight Leg Raises: AAROM, Right, 10 reps, Supine    General Comments        Pertinent Vitals/Pain Pain Assessment Pain Assessment: 0-10 Pain  Score: 7  Pain Location: R knee Pain Descriptors / Indicators: Aching, Grimacing, Sore Pain Intervention(s): Limited activity within patient's tolerance, Monitored during session, Premedicated before session (pt declines ice)    Home Living Family/patient expects to be discharged to:: Private residence Living Arrangements: Alone Available Help at Discharge: Personal care attendant Type of Home: Apartment Home Access: Level entry       Home Layout: One level Home Equipment: Agricultural Consultant (2 wheels);Toilet  riser;Tub bench;Grab bars - tub/shower;Adaptive equipment;Hand held shower head;BSC/3in1      Prior Function            PT Goals (current goals can now be found in the care plan section) Acute Rehab PT Goals Patient Stated Goal: HOme PT Goal Formulation: With patient Time For Goal Achievement: 05/25/24 Potential to Achieve Goals: Fair Progress towards PT goals: Progressing toward goals    Frequency    7X/week      PT Plan      Co-evaluation              AM-PAC PT 6 Clicks Mobility   Outcome Measure  Help needed turning from your back to your side while in a flat bed without using bedrails?: A Lot Help needed moving from lying on your back to sitting on the side of a flat bed without using bedrails?: A Lot Help needed moving to and from a bed to a chair (including a wheelchair)?: A Lot Help needed standing up from a chair using your arms (e.g., wheelchair or bedside chair)?: A Lot Help needed to walk in hospital room?: Total Help needed climbing 3-5 steps with a railing? : Total 6 Click Score: 10    End of Session Equipment Utilized During Treatment: Gait belt;Right knee immobilizer Activity Tolerance: Patient limited by fatigue;Patient limited by pain Patient left: in chair;with call bell/phone within reach;with chair alarm set Nurse Communication: Mobility status PT Visit Diagnosis: Unsteadiness on feet (R26.81);History of falling (Z91.81);Muscle weakness (generalized) (M62.81);Difficulty in walking, not elsewhere classified (R26.2);Pain Pain - Right/Left: Right Pain - part of body: Knee     Time: 8871-8854 PT Time Calculation (min) (ACUTE ONLY): 17 min  Charges:    $Therapeutic Activity: 8-22 mins PT General Charges $$ ACUTE PT VISIT: 1 Visit                     [facility] PT Acute Rehabilitation Services Office [phone]    Lymon Kidney 05/11/2024, 2:51 PM

## 2024-05-11 NOTE — Progress Notes (Signed)
 Subjective: 1 Day Post-Op Procedure(s) (LRB): ARTHROPLASTY, KNEE, TOTAL (Right) Patient reports pain as moderate.    Objective: Vital signs in last 24 hours: Temp:  [97.5 F (36.4 C)-98.6 F (37 C)] 97.7 F (36.5 C) (11/01 9391) Pulse Rate:  [69-80] 76 (11/01 0827) Resp:  [13-18] 16 (11/01 0827) BP: (136-165)/(82-95) 136/86 (11/01 0827) SpO2:  [95 %-100 %] 96 % (11/01 0850)  Intake/Output from previous day: 10/31 0701 - 11/01 0700 In: 2207 [P.O.:507; I.V.:1500; IV Piggyback:200] Out: 4425 [Urine:4400; Blood:25] Intake/Output this shift: No intake/output data recorded.  Recent Labs    05/11/24 0313  HGB 9.8*   Recent Labs    05/11/24 0313  WBC 11.4*  RBC 3.90  HCT 32.1*  PLT 269   Recent Labs    05/11/24 0313  NA 136  K 4.9  CL 102  CO2 26  BUN 17  CREATININE 0.90  GLUCOSE 234*  CALCIUM  9.0   No results for input(s): LABPT, INR in the last 72 hours.  Sensation intact distally Intact pulses distally Dorsiflexion/Plantar flexion intact Incision: dressing C/D/I Compartment soft   Assessment/Plan: 1 Day Post-Op Procedure(s) (LRB): ARTHROPLASTY, KNEE, TOTAL (Right) Up with therapy  The patient did go home after her left total knee replacement that was done a year ago.  However, her sister and family are adamant that she needs short-term skilled nursing following this knee replacement.  The transitional care team has been consulted to look into her short-term skilled nursing for this patient.  Apparently her sister has a place picked out already and is visit that place.    [name] 05/11/2024, 11:00 AM

## 2024-05-11 NOTE — Discharge Instructions (Signed)

## 2024-05-11 NOTE — Care Management Obs Status (Signed)
 MEDICARE OBSERVATION STATUS NOTIFICATION   Patient Details  Name: Rhonda Hickman MRN: [id number] Date of Birth: Oct 24, 1955   Medicare Observation Status Notification Given:  Yes    Sheri ONEIDA Sharps, LCSW 05/11/2024, 4:36 PM

## 2024-05-11 NOTE — Evaluation (Signed)
 Physical Therapy Evaluation Patient Details Name: Rhonda Hickman MRN: [id number] DOB: March 01, 1956 Today's Date: 05/11/2024  History of Present Illness  Pt s/p R TKR and with hx o fL TKR, CAD, CVA, bipolar, afib and neuropathy  Clinical Impression  Pt s/p R TKR and presents with decreased R LE strength/ROM and post op pain limiting functional mobility.  This am, therex program initiated but OOB deferred with pt reporting, I am feeling too drunk to get up.  Patient will benefit from continued inpatient follow up therapy, <3 hours/day        If plan is discharge home, recommend the following: Two people to help with walking and/or transfers;Two people to help with bathing/dressing/bathroom;Assistance with cooking/housework;Assist for transportation;Help with stairs or ramp for entrance   Can travel by private vehicle   No    Equipment Recommendations None recommended by PT  Recommendations for Other Services       Functional Status Assessment Patient has had a recent decline in their functional status and demonstrates the ability to make significant improvements in function in a reasonable and predictable amount of time.     Precautions / Restrictions Precautions Precautions: Fall;Knee Required Braces or Orthoses: Knee Immobilizer - Right Knee Immobilizer - Right: Discontinue once straight leg raise with < 10 degree lag Restrictions Weight Bearing Restrictions Per Provider Order: Yes RLE Weight Bearing Per Provider Order: Weight bearing as tolerated      Mobility  Bed Mobility                    Transfers                        Ambulation/Gait                  Stairs            Wheelchair Mobility     Tilt Bed    Modified Rankin (Stroke Patients Only)       Balance                                             Pertinent Vitals/Pain Pain Assessment Pain Assessment: 0-10 Pain Score: 6  Pain Location: R  knee Pain Descriptors / Indicators: Aching, Grimacing, Sore Pain Intervention(s): Limited activity within patient's tolerance, Monitored during session, Premedicated before session, Ice applied    Home Living Family/patient expects to be discharged to:: Private residence Living Arrangements: Alone Available Help at Discharge: Personal care attendant Type of Home: Apartment Home Access: Level entry       Home Layout: One level Home Equipment: Agricultural Consultant (2 wheels);Toilet riser;Tub bench;Grab bars - tub/shower;Adaptive equipment;Hand held shower head;BSC/3in1      Prior Function Prior Level of Function : Needs assist             Mobility Comments: limited ambulation using RW and admits to falls at home (poor historian)       Extremity/Trunk Assessment   Upper Extremity Assessment Upper Extremity Assessment: RUE deficits/detail;LUE deficits/detail RUE Coordination: decreased fine motor LUE Coordination: decreased fine motor    Lower Extremity Assessment Lower Extremity Assessment: RLE deficits/detail RLE Deficits / Details: 2+/5 quads with AAROM at knee -5 - 40    Cervical / Trunk Assessment Cervical / Trunk Assessment: Normal  Communication   Communication Communication: Impaired Factors Affecting Communication: Reduced clarity  of speech    Cognition Arousal: Alert Behavior During Therapy: Restless, Impulsive, Anxious   PT - Cognitive impairments: History of cognitive impairments                         Following commands: Intact       Cueing Cueing Techniques: Verbal cues, Gestural cues     General Comments      Exercises Total Joint Exercises Ankle Circles/Pumps: AROM, Both, 15 reps, Supine Quad Sets: AAROM, Both, 10 reps, Supine Heel Slides: AAROM, Right, 15 reps, Supine Straight Leg Raises: AAROM, Right, 10 reps, Supine   Assessment/Plan    PT Assessment Patient needs continued PT services  PT Problem List Decreased  strength;Decreased range of motion;Decreased activity tolerance;Decreased balance;Decreased mobility;Decreased knowledge of use of DME;Decreased safety awareness;Pain;Obesity       PT Treatment Interventions DME instruction;Gait training;Stair training;Functional mobility training;Therapeutic activities;Therapeutic exercise;Balance training;Patient/family education    PT Goals (Current goals can be found in the Care Plan section)  Acute Rehab PT Goals Patient Stated Goal: HOme PT Goal Formulation: With patient Time For Goal Achievement: 05/25/24 Potential to Achieve Goals: Fair    Frequency 7X/week     Co-evaluation               AM-PAC PT 6 Clicks Mobility  Outcome Measure Help needed turning from your back to your side while in a flat bed without using bedrails?: A Lot Help needed moving from lying on your back to sitting on the side of a flat bed without using bedrails?: A Lot Help needed moving to and from a bed to a chair (including a wheelchair)?: A Lot Help needed standing up from a chair using your arms (e.g., wheelchair or bedside chair)?: A Lot Help needed to walk in hospital room?: Total Help needed climbing 3-5 steps with a railing? : Total 6 Click Score: 10    End of Session Equipment Utilized During Treatment: Gait belt;Right knee immobilizer Activity Tolerance: Patient tolerated treatment well Patient left: in bed;with call bell/phone within reach;with bed alarm set Nurse Communication: Mobility status PT Visit Diagnosis: Unsteadiness on feet (R26.81);History of falling (Z91.81);Muscle weakness (generalized) (M62.81);Difficulty in walking, not elsewhere classified (R26.2);Pain Pain - Right/Left: Right Pain - part of body: Knee    Time: 9050-8987 PT Time Calculation (min) (ACUTE ONLY): 23 min   Charges:   PT Evaluation $PT Eval Low Complexity: 1 Low   PT General Charges $$ ACUTE PT VISIT: 1 Visit         [facility] PT Acute Rehabilitation  Services Office [phone]   Rhonda Hickman 05/11/2024, 11:57 AM

## 2024-05-12 LAB — GLUCOSE, CAPILLARY
Glucose-Capillary: 178 mg/dL — ABNORMAL HIGH (ref 70–99)
Glucose-Capillary: 145 mg/dL — ABNORMAL HIGH (ref 70–99)
Glucose-Capillary: 132 mg/dL — ABNORMAL HIGH (ref 70–99)
Glucose-Capillary: 110 mg/dL — ABNORMAL HIGH (ref 70–99)

## 2024-05-12 NOTE — Progress Notes (Signed)
 When pt is talking today she is whispering. Nods and follows commands. Answers questions in a whisper voice. Later in day pts sister at bedside and says that at pts baseline she is usually talkative and not as sleepy. Per pts sister she does have some delayed response and has whispered before. Pts vital signs are stable, pt is sleepy but arousable, answers questions with nod and follows commands . RN notified covering provider via secure chat and amnion.

## 2024-05-12 NOTE — Progress Notes (Signed)
 Pt recommended for SNF. PASRR obtained and FL2 completed. SNF ref faxed awaiting bed offers.

## 2024-05-12 NOTE — NC FL2 (Signed)
 [place]  MEDICAID [id number] LEVEL OF CARE FORM     IDENTIFICATION  Patient Name: Rhonda Hickman Birthdate: 07/26/1955 Sex: female Admission Date (Current Location): 05/10/2024  [facility] and [place] Number:  Producer, Television/film/video and Address:  [facility],  [address], [place] [address]      Provider Number: [id number]  Attending Physician Name and Address:  Vernetta Lonni GRADE,*  Relative Name and Phone Number:  Freddi Shields (Sister)  [phone] [facility])    Current Level of Care: Hospital Recommended Level of Care: Skilled Nursing Facility Prior Approval Number:    Date Approved/Denied:   PASRR Number: [id number]  Discharge Plan: SNF    Current Diagnoses: Patient Active Problem List   Diagnosis Date Noted   Status post total right knee replacement 05/10/2024   OA (osteoarthritis) of knee 06/20/2023   Status post total left knee replacement 06/20/2023   Lumbar radiculopathy 02/01/2023   S/P left knee arthroscopy11/29/22 07/13/2021   Tear of meniscus of knee joint    Acute lower UTI 05/23/2021   A-fib (HCC) 05/22/2021   Leukocytosis 05/22/2021   Fall at home, initial encounter 05/22/2021   HLD (hyperlipidemia) 05/22/2021   Hypertension 03/18/2020   Bipolar 1 disorder (HCC)    Chronic atrial fibrillation (HCC) 12/03/2019   TIA (transient ischemic attack) 12/03/2019   Diabetic peripheral neuropathy (HCC) 12/03/2019   Chronic migraine 12/03/2019   Vertigo 08/03/2019   Bronchiectasis with acute exacerbation (HCC)    Obesity, Class III, BMI 40-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) (HCC)    Gastroesophageal reflux disease    Asthma exacerbation 06/06/2018   Gait abnormality 08/29/2017   Paresthesia 08/29/2017   Derangement of posterior horn of medial meniscus of right knee    Meniscus, lateral, derangement, right    Unilateral primary osteoarthritis, right knee    COPD with acute exacerbation (HCC) 03/09/2016   Type 2 diabetes mellitus (HCC) 02/16/2007    Cocaine abuse (HCC) 02/16/2007   Extrinsic asthma 02/16/2007   HOMELESSNESS, HX OF 02/16/2007    Orientation RESPIRATION BLADDER Height & Weight     Self, Time  Normal Continent Weight: 233 lb 11 oz (106 kg) Height:  6' (182.9 cm)  BEHAVIORAL SYMPTOMS/MOOD NEUROLOGICAL BOWEL NUTRITION STATUS      Continent Diet (see dc summary)  AMBULATORY STATUS COMMUNICATION OF NEEDS Skin   Limited Assist Verbally Normal                       Personal Care Assistance Level of Assistance  Bathing, Feeding, Dressing Bathing Assistance: Limited assistance Feeding assistance: Limited assistance Dressing Assistance: Limited assistance     Functional Limitations Info  Speech, Hearing, Sight Sight Info: Adequate Hearing Info: Adequate Speech Info: Adequate    SPECIAL CARE FACTORS FREQUENCY  PT (By licensed PT), OT (By licensed OT)     PT Frequency: 5x/wk OT Frequency: 5x/wk            Contractures Contractures Info: Not present    Additional Factors Info  Code Status, Allergies Code Status Info: Full code Allergies Info: Aspirin            Current Medications (05/12/2024):  This is the current hospital active medication list Current Facility-Administered Medications  Medication Dose Route Frequency Provider Last Rate Last Admin   acetaminophen  (TYLENOL ) tablet 325-650 mg  325-650 mg Oral Q6H PRN Vernetta Lonni GRADE, MD   650 mg at 05/12/24 0838   alum & mag hydroxide-simeth (MAALOX/MYLANTA) 200-200-20 MG/5ML suspension 30 mL  30 mL  Oral Q4H PRN Vernetta Lonni GRADE, MD       amLODipine  (NORVASC ) tablet 5 mg  5 mg Oral Daily Vernetta Lonni GRADE, MD   5 mg at 05/12/24 9164   buPROPion  (WELLBUTRIN  SR) 12 hr tablet 150 mg  150 mg Oral BID Vernetta Lonni GRADE, MD   150 mg at 05/12/24 0836   diphenhydrAMINE  (BENADRYL ) 12.5 MG/5ML elixir 12.5-25 mg  12.5-25 mg Oral Q4H PRN Vernetta Lonni GRADE, MD       divalproex  (DEPAKOTE  ER) 24 hr tablet 1,000 mg  1,000 mg Oral  QHS Vernetta Lonni GRADE, MD   1,000 mg at 05/11/24 2043   divalproex  (DEPAKOTE  ER) 24 hr tablet 500 mg  500 mg Oral Daily Vernetta Lonni GRADE, MD   500 mg at 05/12/24 0847   docusate sodium  (COLACE) capsule 100 mg  100 mg Oral BID Vernetta Lonni GRADE, MD   100 mg at 05/12/24 0847   ferrous sulfate  tablet 325 mg  325 mg Oral Q breakfast Vernetta Lonni GRADE, MD   325 mg at 05/12/24 9162   fluticasone  furoate-vilanterol (BREO ELLIPTA ) 100-25 MCG/ACT 1 puff  1 puff Inhalation Daily Vernetta Lonni GRADE, MD       HYDROmorphone  (DILAUDID ) injection 0.5-1 mg  0.5-1 mg Intravenous Q4H PRN Vernetta Lonni GRADE, MD       HYDROmorphone  (DILAUDID ) tablet 2-3 mg  2-3 mg Oral Q4H PRN Vernetta Lonni GRADE, MD   2 mg at 05/12/24 1326   insulin  aspart (novoLOG ) injection 0-15 Units  0-15 Units Subcutaneous TID WC Vernetta Lonni GRADE, MD   2 Units at 05/12/24 1221   insulin  aspart (novoLOG ) injection 0-5 Units  0-5 Units Subcutaneous QHS Vernetta Lonni GRADE, MD   3 Units at 05/10/24 2225   insulin  glargine-yfgn (SEMGLEE ) injection 20 Units  20 Units Subcutaneous BID Vernetta Lonni GRADE, MD   20 Units at 05/12/24 1004   menthol  (CEPACOL) lozenge 3 mg  1 lozenge Oral PRN Vernetta Lonni GRADE, MD       Or   phenol (CHLORASEPTIC) mouth spray 1 spray  1 spray Mouth/Throat PRN Vernetta Lonni GRADE, MD       metFORMIN  (GLUCOPHAGE ) tablet 1,000 mg  1,000 mg Oral BID WC Vernetta Lonni GRADE, MD   1,000 mg at 05/12/24 9162   metoCLOPramide  (REGLAN ) tablet 5-10 mg  5-10 mg Oral Q8H PRN Vernetta Lonni GRADE, MD       Or   metoCLOPramide  (REGLAN ) injection 5-10 mg  5-10 mg Intravenous Q8H PRN Vernetta Lonni GRADE, MD       metoprolol  tartrate (LOPRESSOR ) tablet 50 mg  50 mg Oral BID Vernetta Lonni GRADE, MD   50 mg at 05/12/24 9165   ondansetron  (ZOFRAN ) tablet 4 mg  4 mg Oral Q6H PRN Vernetta Lonni GRADE, MD       Or   ondansetron  (ZOFRAN ) injection 4 mg  4 mg  Intravenous Q6H PRN Vernetta Lonni GRADE, MD       oxybutynin  (DITROPAN ) tablet 5 mg  5 mg Oral BID Vernetta Lonni GRADE, MD   5 mg at 05/12/24 9165   oxyCODONE  (Oxy IR/ROXICODONE ) immediate release tablet 5-10 mg  5-10 mg Oral Q4H PRN Vernetta Lonni GRADE, MD       pantoprazole  (PROTONIX ) EC tablet 40 mg  40 mg Oral Daily Vernetta Lonni GRADE, MD   40 mg at 05/12/24 9162   rivaroxaban  (XARELTO ) tablet 20 mg  20 mg Oral Q supper Vernetta Lonni GRADE, MD   20 mg  at 05/12/24 1647   rosuvastatin  (CRESTOR ) tablet 10 mg  10 mg Oral Daily Vernetta Lonni GRADE, MD   10 mg at 05/12/24 0836   tiZANidine (ZANAFLEX) tablet 4 mg  4 mg Oral Q6H PRN Vernetta Lonni GRADE, MD         Discharge Medications: Please see discharge summary for a list of discharge medications.  Relevant Imaging Results:  Relevant Lab Results:   Additional Information SSN [id number]  Sheri ONEIDA Sharps, LCSW

## 2024-05-12 NOTE — Progress Notes (Addendum)
  Subjective: Patient stable this morning.  Not reporting too much in terms of pain   Objective: Vital signs in last 24 hours: Temp:  [97.7 F (36.5 C)-98.5 F (36.9 C)] 98.5 F (36.9 C) (11/02 0523) Pulse Rate:  [73-84] 84 (11/02 0523) Resp:  [16-19] 18 (11/02 0523) BP: (136-158)/(79-89) 158/86 (11/02 0523) SpO2:  [96 %-100 %] 100 % (11/02 0523)  Intake/Output from previous day: 11/01 0701 - 11/02 0700 In: 983 [P.O.:983] Out: 1400 [Urine:1400] Intake/Output this shift: No intake/output data recorded.  Exam:  Dorsiflexion/Plantar flexion intact Compartment soft Dressing intact and TED hose in place  Labs: Recent Labs    05/11/24 0313  HGB 9.8*   Recent Labs    05/11/24 0313  WBC 11.4*  RBC 3.90  HCT 32.1*  PLT 269   Recent Labs    05/11/24 0313  NA 136  K 4.9  CL 102  CO2 26  BUN 17  CREATININE 0.90  GLUCOSE 234*  CALCIUM  9.0   No results for input(s): LABPT, INR in the last 72 hours.  Assessment/Plan: Plan at this time is to continue to mobilize with physical therapy.  She looks like she will need rehab.  Not particularly verbal this morning but did communicate answers to all questions appropriately in terms of how she was doing with her recovery.  This amount of communication is baseline for the patient when I discussed it with her nursing team from the prior day.   Rhonda Hickman 05/12/2024, 7:05 AM

## 2024-05-12 NOTE — Progress Notes (Signed)
 Physical Therapy Treatment Patient Details Name: Rhonda Hickman MRN: [id number] DOB: 10/05/1955 Today's Date: 05/12/2024   History of Present Illness Pt s/p R TKR and with hx o fL TKR, CAD, CVA, bipolar, afib and neuropathy    PT Comments  PT attempted x 2 but pt participating only intermittently with therex and too somnolent to attempt any mobility for safety considerations.  RN aware.    If plan is discharge home, recommend the following: Two people to help with walking and/or transfers;Two people to help with bathing/dressing/bathroom;Assistance with cooking/housework;Assist for transportation;Help with stairs or ramp for entrance   Can travel by private vehicle     No  Equipment Recommendations  None recommended by PT    Recommendations for Other Services       Precautions / Restrictions Precautions Precautions: Fall;Knee Required Braces or Orthoses: Knee Immobilizer - Right Knee Immobilizer - Right: Discontinue once straight leg raise with < 10 degree lag Restrictions Weight Bearing Restrictions Per Provider Order: Yes RLE Weight Bearing Per Provider Order: Weight bearing as tolerated     Mobility  Bed Mobility               General bed mobility comments: OOB not attempted for pt safety 2* pt somnolence and limited ability to participate    Transfers                        Ambulation/Gait                   Stairs             Wheelchair Mobility     Tilt Bed    Modified Rankin (Stroke Patients Only)       Balance                                            Communication Communication Communication: Impaired Factors Affecting Communication: Reduced clarity of speech  Cognition Arousal: Lethargic     PT - Cognitive impairments: History of cognitive impairments                         Following commands: Impaired Following commands impaired: Follows one step commands inconsistently, Follows  one step commands with increased time    Cueing Cueing Techniques: Verbal cues, Gestural cues, Tactile cues  Exercises Total Joint Exercises Ankle Circles/Pumps: Both, 15 reps, Supine, AAROM Quad Sets: AAROM, Both, 10 reps, Supine Heel Slides: AAROM, Right, 15 reps, Supine Straight Leg Raises: AAROM, Right, 10 reps, Supine    General Comments        Pertinent Vitals/Pain Pain Assessment Pain Assessment: Faces Faces Pain Scale: Hurts even more Pain Location: R knee with movement Pain Descriptors / Indicators: Grimacing, Guarding Pain Intervention(s): Limited activity within patient's tolerance, Monitored during session (Pt too solmnolent for strong pain meds and declines ice)    Home Living                          Prior Function            PT Goals (current goals can now be found in the care plan section) Acute Rehab PT Goals Patient Stated Goal: HOme PT Goal Formulation: Patient unable to participate in goal setting Time For Goal Achievement: 05/25/24 Potential to Achieve  Goals: Fair Progress towards PT goals: Not progressing toward goals - comment (Too somnolent to attempt any OOB activity safely)    Frequency           PT Plan      Co-evaluation              AM-PAC PT 6 Clicks Mobility   Outcome Measure  Help needed turning from your back to your side while in a flat bed without using bedrails?: Total Help needed moving from lying on your back to sitting on the side of a flat bed without using bedrails?: Total Help needed moving to and from a bed to a chair (including a wheelchair)?: Total Help needed standing up from a chair using your arms (e.g., wheelchair or bedside chair)?: Total Help needed to walk in hospital room?: Total Help needed climbing 3-5 steps with a railing? : Total 6 Click Score: 6    End of Session   Activity Tolerance: Patient limited by fatigue;Patient limited by pain Patient left: in bed;with call bell/phone  within reach;with bed alarm set Nurse Communication: Mobility status PT Visit Diagnosis: Unsteadiness on feet (R26.81);History of falling (Z91.81);Muscle weakness (generalized) (M62.81);Difficulty in walking, not elsewhere classified (R26.2);Pain Pain - Right/Left: Right Pain - part of body: Knee     Time: 1342-1400 PT Time Calculation (min) (ACUTE ONLY): 18 min  Charges:    $Therapeutic Exercise: 8-22 mins PT General Charges $$ ACUTE PT VISIT: 1 Visit                     [facility] PT Acute Rehabilitation Services Office [phone]    Mehtab Dolberry 05/12/2024, 2:04 PM

## 2024-05-12 NOTE — Progress Notes (Signed)
 Pt is awake and alert at this time, eating dinner with some assistance. Pt is answering questions appropriately in a whispered tone. Pt says that she is not having pain at this time.

## 2024-05-13 ENCOUNTER — Encounter (HOSPITAL_COMMUNITY): Payer: Self-pay | Admitting: Orthopaedic Surgery

## 2024-05-13 ENCOUNTER — Observation Stay (HOSPITAL_COMMUNITY)

## 2024-05-13 LAB — BASIC METABOLIC PANEL WITH GFR
Anion gap: 15 (ref 5–15)
BUN: 40 mg/dL — ABNORMAL HIGH (ref 8–23)
CO2: 23 mmol/L (ref 22–32)
Calcium: 9.5 mg/dL (ref 8.9–10.3)
Chloride: 96 mmol/L — ABNORMAL LOW (ref 98–111)
Creatinine, Ser: 1.52 mg/dL — ABNORMAL HIGH (ref 0.44–1.00)
GFR, Estimated: 37 mL/min — ABNORMAL LOW (ref >60)
Glucose, Bld: 105 mg/dL — ABNORMAL HIGH (ref 70–99)
Potassium: 4.6 mmol/L (ref 3.5–5.1)
Sodium: 134 mmol/L — ABNORMAL LOW (ref 135–145)

## 2024-05-13 LAB — TYPE AND SCREEN
ABO/RH(D): A POS
Antibody Screen: NEGATIVE

## 2024-05-13 LAB — CBC
HCT: 30.4 % — ABNORMAL LOW (ref 36.0–46.0)
Hemoglobin: 9.6 g/dL — ABNORMAL LOW (ref 12.0–15.0)
MCH: 25.3 pg — ABNORMAL LOW (ref 26.0–34.0)
MCHC: 31.6 g/dL (ref 30.0–36.0)
MCV: 80.2 fL (ref 80.0–100.0)
Platelets: 291 K/uL (ref 150–400)
RBC: 3.79 MIL/uL — ABNORMAL LOW (ref 3.87–5.11)
RDW: 18.6 % — ABNORMAL HIGH (ref 11.5–15.5)
WBC: 15.9 K/uL — ABNORMAL HIGH (ref 4.0–10.5)
nRBC: 0 % (ref 0.0–0.2)

## 2024-05-13 LAB — GLUCOSE, CAPILLARY
Glucose-Capillary: 106 mg/dL — ABNORMAL HIGH (ref 70–99)
Glucose-Capillary: 132 mg/dL — ABNORMAL HIGH (ref 70–99)
Glucose-Capillary: 146 mg/dL — ABNORMAL HIGH (ref 70–99)
Glucose-Capillary: 136 mg/dL — ABNORMAL HIGH (ref 70–99)

## 2024-05-13 MED ORDER — TIZANIDINE HCL 4 MG PO TABS: 4.0000 mg | ORAL_TABLET | Freq: Four times a day (QID) | ORAL | 0 refills | Status: DC | PRN

## 2024-05-13 MED ORDER — SODIUM CHLORIDE 0.9 % IV SOLN: INTRAVENOUS | Status: AC

## 2024-05-13 MED ORDER — SODIUM CHLORIDE 0.9 % IV BOLUS
500.0000 mL | Freq: Once | INTRAVENOUS | Status: AC
  Administered 2024-05-13: 500 mL via INTRAVENOUS

## 2024-05-13 MED ORDER — OXYCODONE HCL 5 MG PO TABS: 5.0000 mg | ORAL_TABLET | ORAL | 0 refills | Status: DC | PRN

## 2024-05-13 NOTE — Discharge Summary (Signed)
 Patient ID: Rhonda Hickman MRN: [id number] DOB/AGE: 67-Jul-1957 67 y.o.  Admit date: 05/10/2024 Discharge date: 05/13/2024  Admission Diagnoses:  Principal Problem:   Unilateral primary osteoarthritis, right knee Active Problems:   Status post total right knee replacement   Discharge Diagnoses:  Same  Past Medical History:  Diagnosis Date   Anemia    Anxiety    Arthritis    Asthma    Atrial fibrillation (HCC)    Bipolar 1 disorder (HCC)    Bulging lumbar disc    Chronic pain of left knee    COPD (chronic obstructive pulmonary disease) (HCC)    Coronary artery disease    CVA (cerebral vascular accident) (HCC)    Diabetes mellitus    Dyspnea    Dysrhythmia    GERD (gastroesophageal reflux disease)    Neuropathy    TIA (transient ischemic attack)    Vertigo     Surgeries: Procedure(s): ARTHROPLASTY, KNEE, TOTAL on 05/10/2024   Consultants:   Discharged Condition: Improved  Hospital Course: Rhonda Hickman is an 68 y.o. female who was admitted 05/10/2024 for operative treatment ofUnilateral primary osteoarthritis, right knee. Patient has severe unremitting pain that affects sleep, daily activities, and work/hobbies. After pre-op clearance the patient was taken to the operating room on 05/10/2024 and underwent  Procedure(s): ARTHROPLASTY, KNEE, TOTAL.    Patient was given perioperative antibiotics:  Anti-infectives (From admission, onward)    Start     Dose/Rate Route Frequency Ordered Stop   05/10/24 1500  ceFAZolin  (ANCEF ) IVPB 2g/100 mL premix        2 g 200 mL/hr over 30 Minutes Intravenous Every 6 hours 05/10/24 1145 05/10/24 2039   05/10/24 0600  ceFAZolin  (ANCEF ) IVPB 2g/100 mL premix        2 g 200 mL/hr over 30 Minutes Intravenous On call to O.R. 05/10/24 0546 05/10/24 0856   05/10/24 0600  ceFAZolin  (ANCEF ) IVPB 2g/100 mL premix  Status:  Discontinued        2 g 200 mL/hr over 30 Minutes Intravenous On call to O.R. 05/10/24 0546 05/10/24 1138         Patient was given sequential compression devices, early ambulation, and chemoprophylaxis to prevent DVT.  Inpatient Morphine  Milligram Equivalents Per Day 10/31 - 11/3   Values displayed are in units of MME/Day    Order Start / End Date 10/31 11/1 Yesterday Today    oxyCODONE  (Oxy IR/ROXICODONE ) immediate release tablet 5 mg 10/31 - 10/31 0 of Unknown -- -- --    oxyCODONE  (ROXICODONE ) 5 MG/5ML solution 5 mg 10/31 - 10/31 0 of Unknown -- -- --      Group total: 0 of Unknown       fentaNYL  (SUBLIMAZE ) injection 50-100 mcg 10/31 - 10/31 0 of 15-30 -- -- --    fentaNYL  citrate (PF) (SUBLIMAZE ) injection 10/31 - 10/31 *75 of 75 -- -- --    HYDROmorphone  (DILAUDID ) injection 0.25-0.5 mg 10/31 - 10/31 0 of 40-80 -- -- --    HYDROmorphone  (DILAUDID ) injection 0.5-1 mg 10/31 - No end date 0 of 40-80 0 of 60-120 0 of 60-120 0 of 60-120    oxyCODONE  (Oxy IR/ROXICODONE ) immediate release tablet 5-10 mg 10/31 - No end date 0 of 30-60 0 of 45-90 0 of 45-90 0 of 45-90    HYDROmorphone  (DILAUDID ) tablet 2-3 mg 10/31 - No end date 8 of 32-48 16 of 48-72 8 of 48-72 0 of 48-72    HYDROmorphone  (DILAUDID ) injection 10/31 - 10/31 *16  of 16 -- -- --    Daily Totals  * 99 of Unknown (at least 248-389) 16 of 153-282 8 of 153-282 0 of 153-282  *One-Step medication  Calculation Errors     Order Type Date Details   oxyCODONE  (Oxy IR/ROXICODONE ) immediate release tablet 5 mg Ordered Dose -- Insufficient frequency information   oxyCODONE  (ROXICODONE ) 5 MG/5ML solution 5 mg Ordered Dose -- Insufficient frequency information            Patient benefited maximally from hospital stay and there were no complications.    Recent vital signs: Patient Vitals for the past 24 hrs:  BP Temp Temp src Pulse Resp SpO2  05/13/24 0608 119/65 97.8 F (36.6 C) -- 79 17 99 %  05/12/24 2115 128/77 98.7 F (37.1 C) Oral 75 18 97 %  05/12/24 1510 130/73 -- -- 70 16 96 %  05/12/24 1409 123/75 98.6 F (37 C) -- 68 18 98 %   05/12/24 0830 (!) 144/81 -- -- 82 16 100 %     Recent laboratory studies:  Recent Labs    05/11/24 0313  WBC 11.4*  HGB 9.8*  HCT 32.1*  PLT 269  NA 136  K 4.9  CL 102  CO2 26  BUN 17  CREATININE 0.90  GLUCOSE 234*  CALCIUM  9.0     Discharge Medications:   Allergies as of 05/13/2024       Reactions   Aspirin  Nausea Only, Other (See Comments)   Causes stomach pain        Medication List     STOP taking these medications    HYDROcodone -acetaminophen  10-325 MG tablet Commonly known as: NORCO   ibuprofen  200 MG tablet Commonly known as: ADVIL        TAKE these medications    Accu-Chek Guide test strip Generic drug: glucose blood CHECK BLOOD SUGAR UP TO FOUR TIMES DAILY AS DIRECTED   Advocate Insulin  Syringe 29G X 1/2 0.3 ML Misc Generic drug: Insulin  Syringe-Needle U-100 Use to inject insulin  5x daily.   amLODipine  5 MG tablet Commonly known as: NORVASC  Take 5 mg by mouth daily.   blood glucose meter kit and supplies Dispense based on patient and insurance preference. Use up to four times daily as directed. (FOR ICD-10 E10.9, E11.9).   budesonide -formoterol  160-4.5 MCG/ACT inhaler Commonly known as: Symbicort  Inhale 2 puffs into the lungs 2 (two) times daily.   buPROPion  150 MG 12 hr tablet Commonly known as: WELLBUTRIN  SR TAKE ONE TABLET TWICE DAILY   divalproex  500 MG 24 hr tablet Commonly known as: DEPAKOTE  ER Take 500-1,000 mg by mouth See admin instructions. Take 500 mg by mouth in the morning and 1000 mg at bedtime   FeroSul 325 (65 Fe) MG tablet Generic drug: ferrous sulfate  TAKE ONE TABLET BY MOUTH DAILY AT 9AM WITH BREAKFAST   GNP UltiCare Pen Needles 32G X 4 MM Misc Generic drug: Insulin  Pen Needle USE TO INJECT INSULIN  UP TO 5 TIMES A DAY   Lantus  SoloStar 100 UNIT/ML Solostar Pen Generic drug: insulin  glargine Inject 20 Units into the skin 2 (two) times daily.   lidocaine  5 % Commonly known as: Lidoderm  Place 1 patch  onto the skin daily. Remove & Discard patch within 12 hours or as directed by MD   metFORMIN  1000 MG tablet Commonly known as: GLUCOPHAGE  Take 1 tablet (1,000 mg total) by mouth 2 (two) times daily. TAKE ONE TABLET BY MOUTH TWICE DAILY @ 9AM & 5PM WITH MEALS  metoprolol  tartrate 50 MG tablet Commonly known as: LOPRESSOR  TAKE ONE TABLET BY MOUTH TWICE DAILY @ 9AM & 5PM   oxybutynin  5 MG tablet Commonly known as: DITROPAN  Take 1 tablet (5 mg total) by mouth 2-3 (two to three) times daily for bladder spasms.   oxyCODONE  5 MG immediate release tablet Commonly known as: Oxy IR/ROXICODONE  Take 1-2 tablets (5-10 mg total) by mouth every 4 (four) hours as needed for moderate pain (pain score 4-6) (pain score 4-6).   Ozempic  (0.25 or 0.5 MG/DOSE) 2 MG/3ML Sopn Generic drug: Semaglutide (0.25 or 0.5MG /DOS) INJECT 0.5MG  ONCE WEEKLY   rosuvastatin  10 MG tablet Commonly known as: CRESTOR  TAKE ONE TABLET ONCE DAILY   tiZANidine 4 MG tablet Commonly known as: ZANAFLEX Take 1 tablet (4 mg total) by mouth every 6 (six) hours as needed for muscle spasms.   Xarelto  20 MG Tabs tablet Generic drug: rivaroxaban  TAKE ONE TABLET DAILY AT 5PM WITH SUPPER        Diagnostic Studies: DG Knee Right Port Result Date: 05/10/2024 EXAM: 1 or 2 VIEW(S) XRAY OF THE RIGHT KNEE 05/10/2024 10:53:00 AM COMPARISON: Comparison with previous study dated 04/18/2024. CLINICAL HISTORY: [id number] Status post total right knee replacement [id number] Status post total right knee replacement. FINDINGS: BONES AND JOINTS: Super postoperative changes with placement of a right total knee arthroplasty including patellofemoral component. Components appear well seated. No acute bony abnormalities. No joint dislocation. SOFT TISSUES: Soft tissue gas and skin clips are consistent with recent surgery. IMPRESSION: 1. Status post right total knee arthroplasty with well-seated components. 2. No acute bony abnormality. Electronically signed by:  Elsie Gravely MD 05/10/2024 05:50 PM EDT RP Workstation: [id number]   XR Knee 1-2 Views Right Result Date: 04/18/2024 2 views of the right knee show bone-on-bone arthritis of the medial compartment and patellofemoral joint.  There is varus malalignment with osteophytes in all 3 compartments.  There is also sclerotic changes.   Disposition: Discharge disposition: 03-Skilled Nursing Facility          Follow-up Information     Vernetta Lonni GRADE, MD Follow up in 2 week(s).   Specialty: Orthopedic Surgery Contact information: [address] [place]  [place] [address] [phone]                  Signed: Lonni GRADE Vernetta 05/13/2024, 7:25 AM

## 2024-05-13 NOTE — Progress Notes (Signed)
 Subjective: 3 Days Post-Op Procedure(s) (LRB): ARTHROPLASTY, KNEE, TOTAL (Right) Patient reports pain as moderate.    Objective: Vital signs in last 24 hours: Temp:  [97.8 F (36.6 C)-98.7 F (37.1 C)] 97.8 F (36.6 C) (11/03 9391) Pulse Rate:  [68-82] 79 (11/03 0608) Resp:  [16-18] 17 (11/03 0608) BP: (119-144)/(65-81) 119/65 (11/03 0608) SpO2:  [96 %-100 %] 99 % (11/03 0608)  Intake/Output from previous day: 11/02 0701 - 11/03 0700 In: 560 [P.O.:560] Out: 450 [Urine:450] Intake/Output this shift: No intake/output data recorded.  Recent Labs    05/11/24 0313  HGB 9.8*   Recent Labs    05/11/24 0313  WBC 11.4*  RBC 3.90  HCT 32.1*  PLT 269   Recent Labs    05/11/24 0313  NA 136  K 4.9  CL 102  CO2 26  BUN 17  CREATININE 0.90  GLUCOSE 234*  CALCIUM  9.0   No results for input(s): LABPT, INR in the last 72 hours.  Sensation intact distally Intact pulses distally Dorsiflexion/Plantar flexion intact Incision: dressing C/D/I Compartment soft   Assessment/Plan: 3 Days Post-Op Procedure(s) (LRB): ARTHROPLASTY, KNEE, TOTAL (Right) Up with therapy Discharge to SNF today.      Rhonda Hickman 05/13/2024, 7:22 AM

## 2024-05-13 NOTE — Progress Notes (Signed)
 Physical Therapy Treatment Patient Details Name: Rhonda Hickman MRN: [id number] DOB: 23-Mar-1956 Today's Date: 05/13/2024   History of Present Illness Pt s/p R TKR and with hx o fL TKR, CAD, CVA, bipolar, afib and neuropathy    PT Comments  Cognition Comments: Pt in bed sleepy/groggy with untouched lunch tray in front of her and the room phone resting on her shoulder.  Eyes remained closed most of session.  Pt mumbling a few words in responce to questions.  Pt unable to tell me if she slept in a bed or her LIFT Chair.  Pt unable to tell me if she needed to use the bathroom.  Assisted to EOB Total Assist to attempt increased alertness.  Pt screaing in pain.   Remains drunk, exhibits poor self movements and unable to offer much assist. General bed mobility comments: transfering to EOB required Total Assist + 2 using bed pad.  Pt offering 0%.  Screaming/grimacing in pain and unable to support self with static sitting.  Decreased functional use of R UE noted.  Pt does have a Hx CVA.   Cognition did NOT improve with actvity.  Remains mostly lethargic, impaired and groggy.  Required Total Assist + to return to supine and scoot to [facility].  Pt unable to offer any history.  Browsed past EPIC note to see, Pt had her other knee replaced 06/20/23 and missed several OP PT appointments for various reasons.  Amb distance was limited to in home with a walker and had several falls.  Pt also uses a LIFT CHAIR.  LPT has rec Pt will need ST Rehab at SNF to address mobility and functional decline prior to safely returning home.    If plan is discharge home, recommend the following: Two people to help with walking and/or transfers;Two people to help with bathing/dressing/bathroom;Assistance with cooking/housework;Assist for transportation;Help with stairs or ramp for entrance   Can travel by private vehicle     No  Equipment Recommendations  None recommended by PT    Recommendations for Other Services        Precautions / Restrictions Precautions Precautions: Fall;Knee Precaution/Restrictions Comments: no pillow under knee Restrictions Weight Bearing Restrictions Per Provider Order: No RLE Weight Bearing Per Provider Order: Weight bearing as tolerated     Mobility  Bed Mobility Overal bed mobility: Needs Assistance Bed Mobility: Supine to Sit, Sit to Supine     Supine to sit: Total assist, +2 for physical assistance, +2 for safety/equipment Sit to supine: Total assist, +2 for safety/equipment   General bed mobility comments: transfering to EOB required Total Assist + 2 using bed pad.  Pt offering 0%.  Screaming/grimacing in pain and unable to support self with static sitting.  Decreased functional use of R UE noted.  Pt does have a Hx CVA.   Cognition did NOT improve with actvity.  Remains mostly lethargic, impaired and groggy.  Required Total Assist + to return to supine and scoot to [facility].    Transfers                        Ambulation/Gait                   Stairs             Wheelchair Mobility     Tilt Bed    Modified Rankin (Stroke Patients Only)       Balance  Communication Communication Communication: Impaired Factors Affecting Communication: Reduced clarity of speech  Cognition Arousal: Lethargic                             PT - Cognition Comments: Pt in bed sleepy/groggy with untouched lunch tray in front of her and the room phone resting on her shoulder.  Eyes remained closed most of session.  Pt mumbling a few words in responce to questions.  Pt unable to tell me if she slept in a bed or her LIFT Chair.  Pt unable to tell me if she needed to use the bathroom.  Assisted to EOB Total Assist to attempt increased alertness.  Pt screaing in pain.   Remains drunk, exhibits poor self movements and unable to offer much assist. Following commands: Impaired Following  commands impaired: Follows one step commands inconsistently, Follows one step commands with increased time    Cueing Cueing Techniques: Verbal cues, Gestural cues, Tactile cues  Exercises      General Comments        Pertinent Vitals/Pain Pain Assessment Pain Assessment: Faces Faces Pain Scale: Hurts even more Pain Location: R knee with movement Pain Descriptors / Indicators: Grimacing, Guarding, Crying Pain Intervention(s): Monitored during session, Premedicated before session, Repositioned    Home Living                          Prior Function            PT Goals (current goals can now be found in the care plan section)      Frequency    7X/week      PT Plan      Co-evaluation              AM-PAC PT 6 Clicks Mobility   Outcome Measure  Help needed turning from your back to your side while in a flat bed without using bedrails?: Total Help needed moving from lying on your back to sitting on the side of a flat bed without using bedrails?: Total Help needed moving to and from a bed to a chair (including a wheelchair)?: Total Help needed standing up from a chair using your arms (e.g., wheelchair or bedside chair)?: Total Help needed to walk in hospital room?: Total Help needed climbing 3-5 steps with a railing? : Total 6 Click Score: 6    End of Session Equipment Utilized During Treatment: Gait belt Activity Tolerance: Patient limited by lethargy Patient left: in bed;with call bell/phone within reach;with bed alarm set Nurse Communication: Mobility status PT Visit Diagnosis: Unsteadiness on feet (R26.81);History of falling (Z91.81);Muscle weakness (generalized) (M62.81);Difficulty in walking, not elsewhere classified (R26.2);Pain Pain - Right/Left: Right Pain - part of body: Knee     Time: 1244-1316 PT Time Calculation (min) (ACUTE ONLY): 32 min  Charges:    $Therapeutic Activity: 23-37 mins PT General Charges $$ ACUTE PT VISIT: 1  Visit                     Katheryn Leap  PTA Acute  Rehabilitation Services Office M-F          [phone]

## 2024-05-13 NOTE — Progress Notes (Signed)
 Patient ID: Rhonda Hickman, female   DOB: [birth year], 68 y.o.   MRN: [id number] The patient seems to be doing better since I saw her this morning.  During lunch break today her mental status seem to be improved from about the room.  Her CT of her head was negative and the only labs that jumped out was a slightly elevated white blood cell count from the previous lab drawl and her creatinine was up to 1.5.  The plan is to hydrate her today and to hopefully discharge her to skilled nursing tomorrow.  We will repeat the labs in the morning and go from there in terms of trying to get her up with PT.  Nursing is working closely with her as well.

## 2024-05-13 NOTE — Progress Notes (Signed)
 Patient ID: Rhonda Hickman, female   DOB: [birth year], 67 y.o.   MRN: [id number] The patient's vital signs this morning are stable.  However she has been more sleepy and answers questions very slowly.  She whispers more.  This has been a change over the last 24 hours.  Saturday morning she was awake and alert more so.  The plan was to discharge her to short-term skilled nursing.  On exam her right operative knee is stable.  She does follow commands for me in terms of lifting her arms above her head and moving her feet but she does so very slowly.  Will order a CBC and be met this morning and a head CT without contrast just to look for any acute changes.  There is potential for discharge to skilled nursing later today but will need to make sure she is stable medically.

## 2024-05-14 ENCOUNTER — Emergency Department (HOSPITAL_COMMUNITY)

## 2024-05-14 ENCOUNTER — Other Ambulatory Visit: Payer: Self-pay

## 2024-05-14 ENCOUNTER — Telehealth: Payer: Self-pay

## 2024-05-14 ENCOUNTER — Inpatient Hospital Stay (HOSPITAL_COMMUNITY)
Admission: EM | Admit: 2024-05-14 | Discharge: 2024-05-22 | Disposition: A | Discharge status: Skilled Nursing Facility | Source: Skilled Nursing Facility | Attending: Internal Medicine | Admitting: Internal Medicine

## 2024-05-14 DIAGNOSIS — Z9049 Acquired absence of other specified parts of digestive tract: Secondary | ICD-10-CM

## 2024-05-14 DIAGNOSIS — E1165 Type 2 diabetes mellitus with hyperglycemia: Secondary | ICD-10-CM | POA: Diagnosis present

## 2024-05-14 DIAGNOSIS — D509 Iron deficiency anemia, unspecified: Secondary | ICD-10-CM | POA: Diagnosis present

## 2024-05-14 DIAGNOSIS — G4710 Hypersomnia, unspecified: Secondary | ICD-10-CM | POA: Diagnosis present

## 2024-05-14 DIAGNOSIS — E8721 Acute metabolic acidosis: Secondary | ICD-10-CM | POA: Diagnosis present

## 2024-05-14 DIAGNOSIS — R4182 Altered mental status, unspecified: Secondary | ICD-10-CM | POA: Diagnosis present

## 2024-05-14 DIAGNOSIS — Z79899 Other long term (current) drug therapy: Secondary | ICD-10-CM

## 2024-05-14 DIAGNOSIS — M1711 Unilateral primary osteoarthritis, right knee: Secondary | ICD-10-CM | POA: Diagnosis present

## 2024-05-14 DIAGNOSIS — I2510 Atherosclerotic heart disease of native coronary artery without angina pectoris: Secondary | ICD-10-CM | POA: Diagnosis present

## 2024-05-14 DIAGNOSIS — E876 Hypokalemia: Secondary | ICD-10-CM | POA: Diagnosis present

## 2024-05-14 DIAGNOSIS — F17210 Nicotine dependence, cigarettes, uncomplicated: Secondary | ICD-10-CM | POA: Diagnosis present

## 2024-05-14 DIAGNOSIS — Z6831 Body mass index (BMI) 31.0-31.9, adult: Secondary | ICD-10-CM | POA: Diagnosis not present

## 2024-05-14 DIAGNOSIS — Z886 Allergy status to analgesic agent status: Secondary | ICD-10-CM

## 2024-05-14 DIAGNOSIS — N179 Acute kidney failure, unspecified: Secondary | ICD-10-CM | POA: Diagnosis not present

## 2024-05-14 DIAGNOSIS — Z8673 Personal history of transient ischemic attack (TIA), and cerebral infarction without residual deficits: Secondary | ICD-10-CM

## 2024-05-14 DIAGNOSIS — I4891 Unspecified atrial fibrillation: Secondary | ICD-10-CM

## 2024-05-14 DIAGNOSIS — E875 Hyperkalemia: Secondary | ICD-10-CM | POA: Diagnosis present

## 2024-05-14 DIAGNOSIS — F319 Bipolar disorder, unspecified: Secondary | ICD-10-CM | POA: Diagnosis present

## 2024-05-14 DIAGNOSIS — D649 Anemia, unspecified: Secondary | ICD-10-CM

## 2024-05-14 DIAGNOSIS — Z713 Dietary counseling and surveillance: Secondary | ICD-10-CM

## 2024-05-14 DIAGNOSIS — E66811 Obesity, class 1: Secondary | ICD-10-CM | POA: Diagnosis present

## 2024-05-14 DIAGNOSIS — Z833 Family history of diabetes mellitus: Secondary | ICD-10-CM

## 2024-05-14 DIAGNOSIS — Z90710 Acquired absence of both cervix and uterus: Secondary | ICD-10-CM

## 2024-05-14 DIAGNOSIS — Z7901 Long term (current) use of anticoagulants: Secondary | ICD-10-CM | POA: Diagnosis not present

## 2024-05-14 DIAGNOSIS — E1140 Type 2 diabetes mellitus with diabetic neuropathy, unspecified: Secondary | ICD-10-CM | POA: Diagnosis present

## 2024-05-14 DIAGNOSIS — E860 Dehydration: Secondary | ICD-10-CM | POA: Diagnosis present

## 2024-05-14 DIAGNOSIS — K219 Gastro-esophageal reflux disease without esophagitis: Secondary | ICD-10-CM | POA: Diagnosis present

## 2024-05-14 DIAGNOSIS — Z794 Long term (current) use of insulin: Secondary | ICD-10-CM | POA: Diagnosis not present

## 2024-05-14 DIAGNOSIS — R931 Abnormal findings on diagnostic imaging of heart and coronary circulation: Secondary | ICD-10-CM | POA: Diagnosis not present

## 2024-05-14 DIAGNOSIS — I5032 Chronic diastolic (congestive) heart failure: Secondary | ICD-10-CM | POA: Diagnosis present

## 2024-05-14 DIAGNOSIS — Z7985 Long-term (current) use of injectable non-insulin antidiabetic drugs: Secondary | ICD-10-CM

## 2024-05-14 DIAGNOSIS — I700 Atherosclerosis of aorta: Secondary | ICD-10-CM | POA: Diagnosis present

## 2024-05-14 DIAGNOSIS — Z7984 Long term (current) use of oral hypoglycemic drugs: Secondary | ICD-10-CM

## 2024-05-14 DIAGNOSIS — N390 Urinary tract infection, site not specified: Secondary | ICD-10-CM | POA: Diagnosis present

## 2024-05-14 DIAGNOSIS — J4489 Other specified chronic obstructive pulmonary disease: Secondary | ICD-10-CM | POA: Diagnosis present

## 2024-05-14 DIAGNOSIS — N1832 Chronic kidney disease, stage 3b: Secondary | ICD-10-CM | POA: Diagnosis present

## 2024-05-14 DIAGNOSIS — G9341 Metabolic encephalopathy: Secondary | ICD-10-CM | POA: Diagnosis not present

## 2024-05-14 DIAGNOSIS — G928 Other toxic encephalopathy: Secondary | ICD-10-CM | POA: Diagnosis present

## 2024-05-14 DIAGNOSIS — R000 Tachycardia, unspecified: Secondary | ICD-10-CM | POA: Diagnosis present

## 2024-05-14 DIAGNOSIS — K56609 Unspecified intestinal obstruction, unspecified as to partial versus complete obstruction: Secondary | ICD-10-CM | POA: Diagnosis not present

## 2024-05-14 DIAGNOSIS — E1100 Type 2 diabetes mellitus with hyperosmolarity without nonketotic hyperglycemic-hyperosmolar coma (NKHHC): Secondary | ICD-10-CM | POA: Diagnosis not present

## 2024-05-14 DIAGNOSIS — D631 Anemia in chronic kidney disease: Secondary | ICD-10-CM | POA: Diagnosis present

## 2024-05-14 DIAGNOSIS — F419 Anxiety disorder, unspecified: Secondary | ICD-10-CM | POA: Diagnosis present

## 2024-05-14 DIAGNOSIS — E785 Hyperlipidemia, unspecified: Secondary | ICD-10-CM | POA: Diagnosis present

## 2024-05-14 DIAGNOSIS — I4819 Other persistent atrial fibrillation: Secondary | ICD-10-CM | POA: Diagnosis present

## 2024-05-14 DIAGNOSIS — R410 Disorientation, unspecified: Principal | ICD-10-CM

## 2024-05-14 DIAGNOSIS — Z8249 Family history of ischemic heart disease and other diseases of the circulatory system: Secondary | ICD-10-CM

## 2024-05-14 DIAGNOSIS — I480 Paroxysmal atrial fibrillation: Secondary | ICD-10-CM | POA: Diagnosis not present

## 2024-05-14 DIAGNOSIS — J440 Chronic obstructive pulmonary disease with acute lower respiratory infection: Secondary | ICD-10-CM | POA: Diagnosis present

## 2024-05-14 DIAGNOSIS — I483 Typical atrial flutter: Secondary | ICD-10-CM | POA: Diagnosis not present

## 2024-05-14 DIAGNOSIS — Z96653 Presence of artificial knee joint, bilateral: Secondary | ICD-10-CM | POA: Diagnosis present

## 2024-05-14 DIAGNOSIS — K56600 Partial intestinal obstruction, unspecified as to cause: Secondary | ICD-10-CM | POA: Diagnosis not present

## 2024-05-14 DIAGNOSIS — I4892 Unspecified atrial flutter: Secondary | ICD-10-CM | POA: Diagnosis present

## 2024-05-14 DIAGNOSIS — Z7951 Long term (current) use of inhaled steroids: Secondary | ICD-10-CM

## 2024-05-14 DIAGNOSIS — J189 Pneumonia, unspecified organism: Secondary | ICD-10-CM | POA: Diagnosis present

## 2024-05-14 DIAGNOSIS — Z96651 Presence of right artificial knee joint: Secondary | ICD-10-CM

## 2024-05-14 DIAGNOSIS — E1122 Type 2 diabetes mellitus with diabetic chronic kidney disease: Secondary | ICD-10-CM | POA: Diagnosis present

## 2024-05-14 DIAGNOSIS — Y95 Nosocomial condition: Secondary | ICD-10-CM | POA: Diagnosis present

## 2024-05-14 DIAGNOSIS — E782 Mixed hyperlipidemia: Secondary | ICD-10-CM | POA: Diagnosis not present

## 2024-05-14 DIAGNOSIS — I130 Hypertensive heart and chronic kidney disease with heart failure and stage 1 through stage 4 chronic kidney disease, or unspecified chronic kidney disease: Secondary | ICD-10-CM | POA: Diagnosis present

## 2024-05-14 LAB — CBC WITH DIFFERENTIAL/PLATELET
Abs Immature Granulocytes: 0.11 K/uL — ABNORMAL HIGH (ref 0.00–0.07)
Basophils Absolute: 0 K/uL (ref 0.0–0.1)
Basophils Relative: 0 %
Eosinophils Absolute: 0.1 K/uL (ref 0.0–0.5)
Eosinophils Relative: 0 %
HCT: 29.8 % — ABNORMAL LOW (ref 36.0–46.0)
Hemoglobin: 9.6 g/dL — ABNORMAL LOW (ref 12.0–15.0)
Immature Granulocytes: 1 %
Lymphocytes Relative: 23 %
Lymphs Abs: 3.1 K/uL (ref 0.7–4.0)
MCH: 25.9 pg — ABNORMAL LOW (ref 26.0–34.0)
MCHC: 32.2 g/dL (ref 30.0–36.0)
MCV: 80.5 fL (ref 80.0–100.0)
Monocytes Absolute: 1.2 K/uL — ABNORMAL HIGH (ref 0.1–1.0)
Monocytes Relative: 9 %
Neutro Abs: 9.1 K/uL — ABNORMAL HIGH (ref 1.7–7.7)
Neutrophils Relative %: 67 %
Platelets: 334 K/uL (ref 150–400)
RBC: 3.7 MIL/uL — ABNORMAL LOW (ref 3.87–5.11)
RDW: 18.2 % — ABNORMAL HIGH (ref 11.5–15.5)
WBC: 13.7 K/uL — ABNORMAL HIGH (ref 4.0–10.5)
nRBC: 0.4 % — ABNORMAL HIGH (ref 0.0–0.2)

## 2024-05-14 LAB — COMPREHENSIVE METABOLIC PANEL WITH GFR
ALT: 28 U/L (ref 0–44)
AST: 34 U/L (ref 15–41)
Albumin: 2.2 g/dL — ABNORMAL LOW (ref 3.5–5.0)
Alkaline Phosphatase: 73 U/L (ref 38–126)
Anion gap: 16 — ABNORMAL HIGH (ref 5–15)
BUN: 64 mg/dL — ABNORMAL HIGH (ref 8–23)
CO2: 20 mmol/L — ABNORMAL LOW (ref 22–32)
Calcium: 8.2 mg/dL — ABNORMAL LOW (ref 8.9–10.3)
Chloride: 96 mmol/L — ABNORMAL LOW (ref 98–111)
Creatinine, Ser: 1.64 mg/dL — ABNORMAL HIGH (ref 0.44–1.00)
GFR, Estimated: 34 mL/min — ABNORMAL LOW (ref >60)
Glucose, Bld: 110 mg/dL — ABNORMAL HIGH (ref 70–99)
Potassium: 5.9 mmol/L — ABNORMAL HIGH (ref 3.5–5.1)
Sodium: 132 mmol/L — ABNORMAL LOW (ref 135–145)
Total Bilirubin: 0.9 mg/dL (ref 0.0–1.2)
Total Protein: 7.6 g/dL (ref 6.5–8.1)

## 2024-05-14 LAB — I-STAT VENOUS BLOOD GAS, ED
Acid-Base Excess: 0 mmol/L (ref 0.0–2.0)
Bicarbonate: 23.2 mmol/L (ref 20.0–28.0)
Calcium, Ion: 1.01 mmol/L — ABNORMAL LOW (ref 1.15–1.40)
HCT: 30 % — ABNORMAL LOW (ref 36.0–46.0)
Hemoglobin: 10.2 g/dL — ABNORMAL LOW (ref 12.0–15.0)
O2 Saturation: 81 %
Potassium: 5.7 mmol/L — ABNORMAL HIGH (ref 3.5–5.1)
Sodium: 135 mmol/L (ref 135–145)
TCO2: 24 mmol/L (ref 22–32)
pCO2, Ven: 31.6 mmHg — ABNORMAL LOW (ref 44–60)
pH, Ven: 7.474 — ABNORMAL HIGH (ref 7.25–7.43)
pO2, Ven: 41 mmHg (ref 32–45)

## 2024-05-14 LAB — BASIC METABOLIC PANEL WITH GFR
Anion gap: 15 (ref 5–15)
BUN: 52 mg/dL — ABNORMAL HIGH (ref 8–23)
CO2: 23 mmol/L (ref 22–32)
Calcium: 9.2 mg/dL (ref 8.9–10.3)
Chloride: 96 mmol/L — ABNORMAL LOW (ref 98–111)
Creatinine, Ser: 1.51 mg/dL — ABNORMAL HIGH (ref 0.44–1.00)
GFR, Estimated: 37 mL/min — ABNORMAL LOW (ref >60)
Glucose, Bld: 136 mg/dL — ABNORMAL HIGH (ref 70–99)
Potassium: 4.4 mmol/L (ref 3.5–5.1)
Sodium: 133 mmol/L — ABNORMAL LOW (ref 135–145)

## 2024-05-14 LAB — GLUCOSE, CAPILLARY
Glucose-Capillary: 155 mg/dL — ABNORMAL HIGH (ref 70–99)
Glucose-Capillary: 169 mg/dL — ABNORMAL HIGH (ref 70–99)

## 2024-05-14 LAB — I-STAT CG4 LACTIC ACID, ED: Lactic Acid, Venous: 3.5 mmol/L (ref 0.5–1.9)

## 2024-05-14 LAB — CBG MONITORING, ED: Glucose-Capillary: 90 mg/dL (ref 70–99)

## 2024-05-14 LAB — AMMONIA: Ammonia: 28 umol/L (ref 9–35)

## 2024-05-14 MED ORDER — OXYCODONE HCL 5 MG PO TABS
5.0000 mg | ORAL_TABLET | Freq: Four times a day (QID) | ORAL | Status: AC | PRN
  Administered 2024-05-17 – 2024-05-18 (×4): 5 mg via ORAL
  Filled 2024-05-14 (×5): qty 1

## 2024-05-14 MED ORDER — LACTATED RINGERS IV BOLUS
1000.0000 mL | Freq: Once | INTRAVENOUS | Status: AC
  Administered 2024-05-14: 1000 mL via INTRAVENOUS

## 2024-05-14 MED ORDER — HYDROMORPHONE HCL 1 MG/ML IJ SOLN
0.5000 mg | INTRAMUSCULAR | Status: DC | PRN
  Administered 2024-05-16: 0.5 mg via INTRAVENOUS
  Filled 2024-05-14: qty 0.5

## 2024-05-14 MED ORDER — RIVAROXABAN 10 MG PO TABS: 20.0000 mg | ORAL_TABLET | Freq: Every day | ORAL | Status: DC

## 2024-05-14 MED ORDER — SODIUM CHLORIDE 0.9 % IV SOLN
500.0000 mg | Freq: Once | INTRAVENOUS | Status: AC
  Administered 2024-05-15: 500 mg via INTRAVENOUS
  Filled 2024-05-14: qty 5

## 2024-05-14 MED ORDER — SODIUM CHLORIDE 0.9 % IV BOLUS
1000.0000 mL | Freq: Once | INTRAVENOUS | Status: AC
  Administered 2024-05-14: 1000 mL via INTRAVENOUS

## 2024-05-14 MED ORDER — FLUTICASONE FUROATE-VILANTEROL 100-25 MCG/ACT IN AEPB
1.0000 | INHALATION_SPRAY | Freq: Every day | RESPIRATORY_TRACT | Status: DC
  Administered 2024-05-16 – 2024-05-22 (×7): 1 via RESPIRATORY_TRACT
  Filled 2024-05-14: qty 28

## 2024-05-14 MED ORDER — SODIUM CHLORIDE 0.9 % IV SOLN: INTRAVENOUS | Status: AC

## 2024-05-14 MED ORDER — METOPROLOL TARTRATE 5 MG/5ML IV SOLN
INTRAVENOUS | Status: AC
  Filled 2024-05-14: qty 5

## 2024-05-14 MED ORDER — SODIUM CHLORIDE 0.9 % IV SOLN
2.0000 g | Freq: Once | INTRAVENOUS | Status: AC
  Administered 2024-05-14: 2 g via INTRAVENOUS
  Filled 2024-05-14: qty 20

## 2024-05-14 MED ORDER — POLYETHYLENE GLYCOL 3350 17 G PO PACK: 17.0000 g | PACK | Freq: Every day | ORAL | Status: DC | PRN

## 2024-05-14 MED ORDER — PROCHLORPERAZINE EDISYLATE 10 MG/2ML IJ SOLN
5.0000 mg | Freq: Four times a day (QID) | INTRAMUSCULAR | Status: DC | PRN
Start: 2024-05-14 — End: 2024-05-16
  Administered 2024-05-15 – 2024-05-16 (×3): 5 mg via INTRAVENOUS
  Filled 2024-05-14 (×3): qty 2

## 2024-05-14 MED ORDER — METOPROLOL TARTRATE 5 MG/5ML IV SOLN
5.0000 mg | Freq: Once | INTRAVENOUS | Status: AC
  Administered 2024-05-14: 5 mg via INTRAVENOUS

## 2024-05-14 MED ORDER — ACETAMINOPHEN 500 MG PO TABS
500.0000 mg | ORAL_TABLET | Freq: Four times a day (QID) | ORAL | Status: DC | PRN
  Administered 2024-05-17 – 2024-05-18 (×2): 500 mg via ORAL
  Filled 2024-05-14 (×2): qty 1

## 2024-05-14 MED ORDER — ROSUVASTATIN CALCIUM 5 MG PO TABS
10.0000 mg | ORAL_TABLET | Freq: Every day | ORAL | Status: DC
Start: 2024-05-15 — End: 2024-05-15

## 2024-05-14 NOTE — Discharge Summary (Signed)
 Patient ID: Rhonda Hickman MRN: [id number] DOB/AGE: 1955/12/02 68 y.o.  Admit date: 05/10/2024 Discharge date: 05/14/2024  Admission Diagnoses:  Principal Problem:   Unilateral primary osteoarthritis, right knee Active Problems:   Status post total right knee replacement   Discharge Diagnoses:  Same  Past Medical History:  Diagnosis Date   Anemia    Anxiety    Arthritis    Asthma    Atrial fibrillation (HCC)    Bipolar 1 disorder (HCC)    Bulging lumbar disc    Chronic pain of left knee    COPD (chronic obstructive pulmonary disease) (HCC)    Coronary artery disease    CVA (cerebral vascular accident) (HCC)    Diabetes mellitus    Dyspnea    Dysrhythmia    GERD (gastroesophageal reflux disease)    Neuropathy    TIA (transient ischemic attack)    Vertigo     Surgeries: Procedure(s): ARTHROPLASTY, KNEE, TOTAL on 05/10/2024   Consultants:   Discharged Condition: Improved  Hospital Course: Rhonda Hickman is an 68 y.o. female who was admitted 05/10/2024 for operative treatment ofUnilateral primary osteoarthritis, right knee. Patient has severe unremitting pain that affects sleep, daily activities, and work/hobbies. After pre-op clearance the patient was taken to the operating room on 05/10/2024 and underwent  Procedure(s): ARTHROPLASTY, KNEE, TOTAL.    Patient was given perioperative antibiotics:  Anti-infectives (From admission, onward)    Start     Dose/Rate Route Frequency Ordered Stop   05/10/24 1500  ceFAZolin  (ANCEF ) IVPB 2g/100 mL premix        2 g 200 mL/hr over 30 Minutes Intravenous Every 6 hours 05/10/24 1145 05/10/24 2039   05/10/24 0600  ceFAZolin  (ANCEF ) IVPB 2g/100 mL premix        2 g 200 mL/hr over 30 Minutes Intravenous On call to O.R. 05/10/24 0546 05/10/24 0856   05/10/24 0600  ceFAZolin  (ANCEF ) IVPB 2g/100 mL premix  Status:  Discontinued        2 g 200 mL/hr over 30 Minutes Intravenous On call to O.R. 05/10/24 0546 05/10/24 1138         Patient was given sequential compression devices, early ambulation, and chemoprophylaxis to prevent DVT.  Inpatient Morphine  Milligram Equivalents Per Day 10/31 - 11/4   Values displayed are in units of MME/Day    Order Start / End Date 10/31 11/1 11/2 Yesterday Today    oxyCODONE  (Oxy IR/ROXICODONE ) immediate release tablet 5 mg 10/31 - 10/31 0 of Unknown -- -- -- --    oxyCODONE  (ROXICODONE ) 5 MG/5ML solution 5 mg 10/31 - 10/31 0 of Unknown -- -- -- --      Group total: 0 of Unknown        fentaNYL  (SUBLIMAZE ) injection 50-100 mcg 10/31 - 10/31 0 of 15-30 -- -- -- --    fentaNYL  citrate (PF) (SUBLIMAZE ) injection 10/31 - 10/31 *75 of 75 -- -- -- --    HYDROmorphone  (DILAUDID ) injection 0.25-0.5 mg 10/31 - 10/31 0 of 40-80 -- -- -- --    HYDROmorphone  (DILAUDID ) injection 0.5-1 mg 10/31 - No end date 0 of 40-80 0 of 60-120 0 of 60-120 0 of 60-120 0 of 60-120    oxyCODONE  (Oxy IR/ROXICODONE ) immediate release tablet 5-10 mg 10/31 - No end date 0 of 30-60 0 of 45-90 0 of 45-90 0 of 45-90 0 of 45-90    HYDROmorphone  (DILAUDID ) tablet 2-3 mg 10/31 - No end date 8 of 32-48 16 of 48-72 8 of 48-72  0 of 48-72 0 of 48-72    HYDROmorphone  (DILAUDID ) injection 10/31 - 10/31 *16 of 16 -- -- -- --    Daily Totals  * 99 of Unknown (at least 248-389) 16 of 153-282 8 of 153-282 0 of 153-282 0 of 153-282  *One-Step medication  Calculation Errors     Order Type Date Details   oxyCODONE  (Oxy IR/ROXICODONE ) immediate release tablet 5 mg Ordered Dose -- Insufficient frequency information   oxyCODONE  (ROXICODONE ) 5 MG/5ML solution 5 mg Ordered Dose -- Insufficient frequency information            Patient benefited maximally from hospital stay and there were no complications.  The patient was kept in the hospital and extra day due to somnolence.  A head CT was negative and there was no other worrisome findings medically.  On the day of discharge her vital signs were stable and she was following commands  appropriately.  Her right operative knee was stable as well.  Recent vital signs: Patient Vitals for the past 24 hrs:  BP Temp Temp src Pulse Resp SpO2  05/14/24 0638 (!) 122/91 97.9 F (36.6 C) Oral 71 14 98 %  05/13/24 1944 126/78 97.9 F (36.6 C) Oral 74 19 99 %  05/13/24 1341 119/70 98.2 F (36.8 C) Oral 73 18 99 %  05/13/24 1051 120/67 -- -- 82 16 97 %  05/13/24 0931 117/80 98 F (36.7 C) Oral 82 17 98 %     Recent laboratory studies:  Recent Labs    05/13/24 0756 05/14/24 0326  WBC 15.9*  --   HGB 9.6*  --   HCT 30.4*  --   PLT 291  --   NA 134* 133*  K 4.6 4.4  CL 96* 96*  CO2 23 23  BUN 40* 52*  CREATININE 1.52* 1.51*  GLUCOSE 105* 136*  CALCIUM  9.5 9.2     Discharge Medications:   Allergies as of 05/14/2024       Reactions   Aspirin  Nausea Only, Other (See Comments)   Causes stomach pain        Medication List     STOP taking these medications    HYDROcodone -acetaminophen  10-325 MG tablet Commonly known as: NORCO   ibuprofen  200 MG tablet Commonly known as: ADVIL        TAKE these medications    Accu-Chek Guide test strip Generic drug: glucose blood CHECK BLOOD SUGAR UP TO FOUR TIMES DAILY AS DIRECTED   Advocate Insulin  Syringe 29G X 1/2 0.3 ML Misc Generic drug: Insulin  Syringe-Needle U-100 Use to inject insulin  5x daily.   amLODipine  5 MG tablet Commonly known as: NORVASC  Take 5 mg by mouth daily.   blood glucose meter kit and supplies Dispense based on patient and insurance preference. Use up to four times daily as directed. (FOR ICD-10 E10.9, E11.9).   budesonide -formoterol  160-4.5 MCG/ACT inhaler Commonly known as: Symbicort  Inhale 2 puffs into the lungs 2 (two) times daily.   buPROPion  150 MG 12 hr tablet Commonly known as: WELLBUTRIN  SR TAKE ONE TABLET TWICE DAILY   divalproex  500 MG 24 hr tablet Commonly known as: DEPAKOTE  ER Take 500-1,000 mg by mouth See admin instructions. Take 500 mg by mouth in the morning and  1000 mg at bedtime   FeroSul 325 (65 Fe) MG tablet Generic drug: ferrous sulfate  TAKE ONE TABLET BY MOUTH DAILY AT 9AM WITH BREAKFAST   GNP UltiCare Pen Needles 32G X 4 MM Misc Generic drug: Insulin  Pen Needle  USE TO INJECT INSULIN  UP TO 5 TIMES A DAY   Lantus  SoloStar 100 UNIT/ML Solostar Pen Generic drug: insulin  glargine Inject 20 Units into the skin 2 (two) times daily.   lidocaine  5 % Commonly known as: Lidoderm  Place 1 patch onto the skin daily. Remove & Discard patch within 12 hours or as directed by MD   metFORMIN  1000 MG tablet Commonly known as: GLUCOPHAGE  Take 1 tablet (1,000 mg total) by mouth 2 (two) times daily. TAKE ONE TABLET BY MOUTH TWICE DAILY @ 9AM & 5PM WITH MEALS   metoprolol  tartrate 50 MG tablet Commonly known as: LOPRESSOR  TAKE ONE TABLET BY MOUTH TWICE DAILY @ 9AM & 5PM   oxybutynin  5 MG tablet Commonly known as: DITROPAN  Take 1 tablet (5 mg total) by mouth 2-3 (two to three) times daily for bladder spasms.   oxyCODONE  5 MG immediate release tablet Commonly known as: Oxy IR/ROXICODONE  Take 1-2 tablets (5-10 mg total) by mouth every 4 (four) hours as needed for moderate pain (pain score 4-6) (pain score 4-6).   Ozempic  (0.25 or 0.5 MG/DOSE) 2 MG/3ML Sopn Generic drug: Semaglutide (0.25 or 0.5MG /DOS) INJECT 0.5MG  ONCE WEEKLY   rosuvastatin  10 MG tablet Commonly known as: CRESTOR  TAKE ONE TABLET ONCE DAILY   tiZANidine 4 MG tablet Commonly known as: ZANAFLEX Take 1 tablet (4 mg total) by mouth every 6 (six) hours as needed for muscle spasms.   Xarelto  20 MG Tabs tablet Generic drug: rivaroxaban  TAKE ONE TABLET DAILY AT 5PM WITH SUPPER        Diagnostic Studies: CT HEAD WO CONTRAST ( ) Result Date: 05/13/2024 EXAM: CT HEAD WITHOUT CONTRAST 05/13/2024 10:55:43 AM TECHNIQUE: CT of the head was performed without the administration of intravenous contrast. Automated exposure control, iterative reconstruction, and/or weight based adjustment of  the mA/kV was utilized to reduce the radiation dose to as low as reasonably achievable. COMPARISON: Head CT 05/22/2021 and MRI 06/16/2020. CLINICAL HISTORY: Mental status change, unknown cause. FINDINGS: BRAIN AND VENTRICLES: There is no evidence of an acute infarct, intracranial hemorrhage, mass, midline shift, hydrocephalus, or extra-axial fluid collection. Cerebral volume is within normal limits for age. Patchy hypodensities in the cerebral white matter bilaterally are similar to the prior CT and nonspecific but compatible with moderate chronic small vessel ischemic disease. Calcified atherosclerosis at the skull base. ORBITS: Bilateral cataract extraction. SINUSES: No acute abnormality. SOFT TISSUES AND SKULL: No acute soft tissue abnormality. No skull fracture. IMPRESSION: 1. No acute intracranial abnormality. 2. Moderate chronic small vessel ischemic disease. Electronically signed by: Dasie Hamburg MD 05/13/2024 11:24 AM EST RP Workstation: [id number]   DG Knee Right Port Result Date: 05/10/2024 EXAM: 1 or 2 VIEW(S) XRAY OF THE RIGHT KNEE 05/10/2024 10:53:00 AM COMPARISON: Comparison with previous study dated 04/18/2024. CLINICAL HISTORY: [id number] Status post total right knee replacement [id number] Status post total right knee replacement. FINDINGS: BONES AND JOINTS: Super postoperative changes with placement of a right total knee arthroplasty including patellofemoral component. Components appear well seated. No acute bony abnormalities. No joint dislocation. SOFT TISSUES: Soft tissue gas and skin clips are consistent with recent surgery. IMPRESSION: 1. Status post right total knee arthroplasty with well-seated components. 2. No acute bony abnormality. Electronically signed by: Elsie Gravely MD 05/10/2024 05:50 PM EDT RP Workstation: [id number]   XR Knee 1-2 Views Right Result Date: 04/18/2024 2 views of the right knee show bone-on-bone arthritis of the medial compartment and patellofemoral joint.  There is  varus malalignment with osteophytes in all 3 compartments.  There  is also sclerotic changes.   Disposition: Discharge disposition: 03-Skilled Nursing Facility          Follow-up Information     Vernetta Lonni GRADE, MD Follow up in 2 week(s).   Specialty: Orthopedic Surgery Contact information: [address] [place]  [place] [address] [phone]                  Signed: Lonni GRADE Vernetta 05/14/2024, 6:49 AM

## 2024-05-14 NOTE — Progress Notes (Signed)
 Patient discharged to [facility], IV removed, copy of discharge paperwork provided in discharge packet, report called and given to receiving facility, transportation called by [facility], primary RN called patient's family to make them aware of patient transport to [name].

## 2024-05-14 NOTE — ED Triage Notes (Signed)
 Pt BIB [facility] from [name] c/o tachycardia. Pt had R knee surgery today and went to rehab today where they noticed high HR and called EMS. Pt has productive cough and RR 30. Per EMS, pt is slightly confused.  106/70 CBG 135 97% RA HR 144 GCS 13

## 2024-05-14 NOTE — ED Provider Notes (Signed)
 [place] EMERGENCY DEPARTMENT AT [facility] Provider Note   CSN: [id number] Arrival date & time: 05/14/24  1910     Patient presents with: No chief complaint on file.   Rhonda Hickman is a 68 y.o. female.   68 yo F with a chief complaints of altered mental status.  The patient had her right knee replaced since then has been in a rehab facility.  Reportedly has not been acting right since the surgery.  Decision made to send her here for evaluation.  Patient confused level 5 caveat is altered mental status.        Prior to Admission medications   Medication Sig Start Date End Date Taking? Authorizing Provider  ACCU-CHEK GUIDE test strip CHECK BLOOD SUGAR UP TO FOUR TIMES DAILY AS DIRECTED 12/02/22   Celestia Rosaline SQUIBB, NP  amLODipine  (NORVASC ) 5 MG tablet Take 5 mg by mouth daily. 03/12/24   [provider]  blood glucose meter kit and supplies Dispense based on patient and insurance preference. Use up to four times daily as directed. (FOR ICD-10 E10.9, E11.9). 08/03/21   Celestia Rosaline SQUIBB, NP  budesonide -formoterol  (SYMBICORT ) 160-4.5 MCG/ACT inhaler Inhale 2 puffs into the lungs 2 (two) times daily. 03/10/24   Celestia Rosaline SQUIBB, NP  buPROPion  (WELLBUTRIN  SR) 150 MG 12 hr tablet TAKE ONE TABLET TWICE DAILY 03/12/24   Celestia Rosaline SQUIBB, NP  divalproex  (DEPAKOTE  ER) 500 MG 24 hr tablet Take 500-1,000 mg by mouth See admin instructions. Take 500 mg by mouth in the morning and 1000 mg at bedtime 05/19/21   [provider]  FEROSUL 325 (65 Fe) MG tablet TAKE ONE TABLET BY MOUTH DAILY AT 9AM WITH BREAKFAST 02/16/23   Celestia Rosaline SQUIBB, NP  Insulin  Pen Needle (GNP ULTICARE PEN NEEDLES) 32G X 4 MM MISC USE TO INJECT INSULIN  UP TO 5 TIMES A DAY 08/09/23   Celestia Rosaline SQUIBB, NP  Insulin  Syringe-Needle U-100 (ADVOCATE INSULIN  SYRINGE) 29G X 1/2 0.3 ML MISC Use to inject insulin  5x daily. 09/07/20   Celestia Rosaline SQUIBB, NP  LANTUS  SOLOSTAR 100 UNIT/ML Solostar Pen  Inject 20 Units into the skin 2 (two) times daily.    [provider]  lidocaine  (LIDODERM ) 5 % Place 1 patch onto the skin daily. Remove & Discard patch within 12 hours or as directed by MD 10/04/23   Theadore Ozell HERO, MD  metFORMIN  (GLUCOPHAGE ) 1000 MG tablet Take 1 tablet (1,000 mg total) by mouth 2 (two) times daily. TAKE ONE TABLET BY MOUTH TWICE DAILY @ 9AM & 5PM WITH MEALS 08/09/23   Celestia Rosaline SQUIBB, NP  metoprolol  tartrate (LOPRESSOR ) 50 MG tablet TAKE ONE TABLET BY MOUTH TWICE DAILY @ 9AM & 5PM 05/19/23   Kate Lonni CROME, MD  oxybutynin  (DITROPAN ) 5 MG tablet Take 1 tablet (5 mg total) by mouth 2-3 (two to three) times daily for bladder spasms. 03/07/24   Celestia Rosaline SQUIBB, NP  oxyCODONE  (OXY IR/ROXICODONE ) 5 MG immediate release tablet Take 1-2 tablets (5-10 mg total) by mouth every 4 (four) hours as needed for moderate pain (pain score 4-6) (pain score 4-6). 05/13/24   Vernetta Lonni GRADE, MD  OZEMPIC , 0.25 OR 0.5 MG/DOSE, 2 MG/3ML SOPN INJECT 0.5MG  ONCE WEEKLY 04/10/24   Newlin, Enobong, MD  rivaroxaban  (XARELTO ) 20 MG TABS tablet TAKE ONE TABLET DAILY AT 5PM WITH SUPPER 03/12/24   Celestia Rosaline SQUIBB, NP  rosuvastatin  (CRESTOR ) 10 MG tablet TAKE ONE TABLET ONCE DAILY 03/13/24   Kate Lonni CROME,  MD  tiZANidine (ZANAFLEX) 4 MG tablet Take 1 tablet (4 mg total) by mouth every 6 (six) hours as needed for muscle spasms. 05/13/24   Vernetta Lonni GRADE, MD    Allergies: Aspirin     Review of Systems  Updated Vital Signs BP 110/87   Pulse (!) 146   Temp 97.7 F (36.5 C)   Resp 12   Ht 6' (1.829 m)   Wt 106 kg   SpO2 100%   BMI 31.69 kg/m   Physical Exam Vitals and nursing note reviewed.  Constitutional:      General: She is not in acute distress.    Appearance: She is well-developed. She is not diaphoretic.  HENT:     Head: Normocephalic and atraumatic.  Eyes:     Pupils: Pupils are equal, round, and reactive to light.  Cardiovascular:     Rate and  Rhythm: Regular rhythm. Tachycardia present.     Heart sounds: No murmur heard.    No friction rub. No gallop.  Pulmonary:     Effort: Pulmonary effort is normal.     Breath sounds: Rhonchi present. No wheezing or rales.     Comments: Tachypnea, rhonchi best noted in the left lower lung fields Abdominal:     General: There is no distension.     Palpations: Abdomen is soft.     Tenderness: There is no abdominal tenderness.  Musculoskeletal:        General: No tenderness.     Cervical back: Normal range of motion and neck supple.     Comments: Mepilex dressing in place to the right knee.  There is some mild edema and erythema and warmth.  Pulse motor and sensation intact distally.  Wound is clean dry and intact.  No obvious drainage.  No fluctuance or induration.  Skin:    General: Skin is warm and dry.  Neurological:     Mental Status: She is alert.     Comments: Patient seems confused.  Her eyes are open and she slowly tracks around the room.  Slowed response.  Psychiatric:        Behavior: Behavior normal.     (all labs ordered are listed, but only abnormal results are displayed) Labs Reviewed  COMPREHENSIVE METABOLIC PANEL WITH GFR - Abnormal; Notable for the following components:      Result Value   Sodium 132 (*)    Potassium 5.9 (*)    Chloride 96 (*)    CO2 20 (*)    Glucose, Bld 110 (*)    BUN 64 (*)    Creatinine, Ser 1.64 (*)    Calcium  8.2 (*)    Albumin 2.2 (*)    GFR, Estimated 34 (*)    Anion gap 16 (*)    All other components within normal limits  CBC WITH DIFFERENTIAL/PLATELET - Abnormal; Notable for the following components:   WBC 13.7 (*)    RBC 3.70 (*)    Hemoglobin 9.6 (*)    HCT 29.8 (*)    MCH 25.9 (*)    RDW 18.2 (*)    nRBC 0.4 (*)    Neutro Abs 9.1 (*)    Monocytes Absolute 1.2 (*)    Abs Immature Granulocytes 0.11 (*)    All other components within normal limits  I-STAT CG4 LACTIC ACID, ED - Abnormal; Notable for the following  components:   Lactic Acid, Venous 3.5 (*)    All other components within normal limits  I-STAT VENOUS BLOOD  GAS, ED - Abnormal; Notable for the following components:   pH, Ven 7.474 (*)    pCO2, Ven 31.6 (*)    Potassium 5.7 (*)    Calcium , Ion 1.01 (*)    HCT 30.0 (*)    Hemoglobin 10.2 (*)    All other components within normal limits  CULTURE, BLOOD (ROUTINE X 2)  CULTURE, BLOOD (ROUTINE X 2)  AMMONIA  URINALYSIS, W/ REFLEX TO CULTURE (INFECTION SUSPECTED)  HIV ANTIBODY (ROUTINE TESTING W REFLEX)  I-STAT CG4 LACTIC ACID, ED    EKG: EKG Interpretation Date/Time:  Tuesday May 14 2024 19:29:43 EST Ventricular Rate:  146 PR Interval:    QRS Duration:  133 QT Interval:  343 QTC Calculation: 535 R Axis:   -37  Text Interpretation: Sinus tachycardia Nonspecific IVCD with LAD Left ventricular hypertrophy Abnormal T, consider ischemia, diffuse leads st changes likely rate related Otherwise no significant change Confirmed by Emil Share [phone]) on 05/14/2024 7:50:47 PM  Radiology: CT Head Wo Contrast Result Date: 05/14/2024 EXAM: CT HEAD WITHOUT CONTRAST 05/14/2024 08:02:51 PM TECHNIQUE: CT of the head was performed without the administration of intravenous contrast. Automated exposure control, iterative reconstruction, and/or weight based adjustment of the mA/kV was utilized to reduce the radiation dose to as low as reasonably achievable. COMPARISON: 05/13/2024. CLINICAL HISTORY: Delirium. FINDINGS: BRAIN AND VENTRICLES: No acute hemorrhage. No evidence of acute infarct. No hydrocephalus. No extra-axial collection. No mass effect or midline shift. There is atrophy and chronic small vessel disease throughout the deep white matter. ORBITS: No acute abnormality. SINUSES: No acute abnormality. SOFT TISSUES AND SKULL: No acute soft tissue abnormality. No skull fracture. IMPRESSION: 1. No acute intracranial abnormality. 2. Atrophy and chronic small vessel disease throughout the deep white  matter. Electronically signed by: Franky Crease MD 05/14/2024 08:08 PM EST RP Workstation: [id number]   DG Chest Port 1 View Result Date: 05/14/2024 EXAM: 1 VIEW(S) XRAY OF THE CHEST 05/14/2024 07:55:00 PM COMPARISON: 10/02/2023 CLINICAL HISTORY: Questionable sepsis - evaluate for abnormality FINDINGS: LUNGS AND PLEURA: Low lung volumes. Right basilar atelectasis. No pulmonary edema. No pleural effusion. No pneumothorax. HEART AND MEDIASTINUM: No acute abnormality of the cardiac and mediastinal silhouettes. BONES AND SOFT TISSUES: No acute osseous abnormality. IMPRESSION: 1. Low lung volumes with right basilar atelectasis. Electronically signed by: Franky Crease MD 05/14/2024 08:05 PM EST RP Workstation: [id number]   CT HEAD WO CONTRAST ( ) Result Date: 05/13/2024 EXAM: CT HEAD WITHOUT CONTRAST 05/13/2024 10:55:43 AM TECHNIQUE: CT of the head was performed without the administration of intravenous contrast. Automated exposure control, iterative reconstruction, and/or weight based adjustment of the mA/kV was utilized to reduce the radiation dose to as low as reasonably achievable. COMPARISON: Head CT 05/22/2021 and MRI 06/16/2020. CLINICAL HISTORY: Mental status change, unknown cause. FINDINGS: BRAIN AND VENTRICLES: There is no evidence of an acute infarct, intracranial hemorrhage, mass, midline shift, hydrocephalus, or extra-axial fluid collection. Cerebral volume is within normal limits for age. Patchy hypodensities in the cerebral white matter bilaterally are similar to the prior CT and nonspecific but compatible with moderate chronic small vessel ischemic disease. Calcified atherosclerosis at the skull base. ORBITS: Bilateral cataract extraction. SINUSES: No acute abnormality. SOFT TISSUES AND SKULL: No acute soft tissue abnormality. No skull fracture. IMPRESSION: 1. No acute intracranial abnormality. 2. Moderate chronic small vessel ischemic disease. Electronically signed by: Dasie Hamburg MD 05/13/2024 11:24  AM EST RP Workstation: [id number]     .Critical Care  Performed by: Emil Share, DO Authorized by: Emil Share, DO  Critical care provider statement:    Critical care time (minutes):  35   Critical care time was exclusive of:  Separately billable procedures and treating other patients   Critical care was time spent personally by me on the following activities:  Development of treatment plan with patient or surrogate, discussions with consultants, evaluation of patient's response to treatment, examination of patient, ordering and review of laboratory studies, ordering and review of radiographic studies, ordering and performing treatments and interventions, pulse oximetry, re-evaluation of patient's condition and review of old charts   Care discussed with: admitting provider      Medications Ordered in the ED  cefTRIAXone  (ROCEPHIN ) 2 g in sodium chloride  0.9 % 100 mL IVPB (has no administration in time range)  azithromycin  (ZITHROMAX ) 500 mg in sodium chloride  0.9 % 250 mL IVPB (has no administration in time range)  metoprolol  tartrate (LOPRESSOR ) 5 MG/5ML injection (has no administration in time range)  rosuvastatin  (CRESTOR ) tablet 10 mg (has no administration in time range)  rivaroxaban  (XARELTO ) tablet 20 mg (has no administration in time range)  fluticasone  furoate-vilanterol (BREO ELLIPTA ) 100-25 MCG/ACT 1 puff (has no administration in time range)  acetaminophen  (TYLENOL ) tablet 500 mg (has no administration in time range)  oxyCODONE  (Oxy IR/ROXICODONE ) immediate release tablet 5 mg (has no administration in time range)  HYDROmorphone  (DILAUDID ) injection 0.5 mg (has no administration in time range)  polyethylene glycol (MIRALAX  / GLYCOLAX ) packet 17 g (has no administration in time range)  prochlorperazine  (COMPAZINE ) injection 5 mg (has no administration in time range)  lactated ringers  bolus 1,000 mL (0 mLs Intravenous Stopped 05/14/24 2146)  sodium chloride  0.9 % bolus 1,000 mL  (1,000 mLs Intravenous New Bag/Given 05/14/24 2212)  metoprolol  tartrate (LOPRESSOR ) injection 5 mg (5 mg Intravenous Given 05/14/24 2206)                                    Medical Decision Making Amount and/or Complexity of Data Reviewed Labs: ordered. Radiology: ordered.  Risk Prescription drug management. Decision regarding hospitalization.   68  yo F with a chief complaint of confusion after a total knee replacement.  Surgery 4 days ago.  Per EMS confused since coming to rehab. Unable to obtain further history from patient.   AMS workup, screen for infection.  CT head.   HR in the 140's. Sinus tach on the monitor.   The patient's sister has arrived and provides further history.  States that she was at her normal mental status when she went in to have her surgery and since then has been very sleepy.  She thinks it is due to medications that were given to her while in the hospital.  As she was not improving she was discharged to rehab and when she got there this evening they felt she needed to be evaluated medically and was sent here via EMS.  Patient with persistent tachycardia.  Perhaps the patient is in atrial flutter with 2 1 block though I feel I do see P waves on the telemetry monitor.  Plain film of the chest on my independent or potation with likely left lower lobe infiltrate that obscures the lateral aspect of the heart border.  Will start on IV antibiotics.  Will give a dose of metoprolol  here.  Discussed with hospitalist for admission.  The patients results and plan were reviewed and discussed.   Any x-rays performed were independently reviewed by  myself.   Differential diagnosis were considered with the presenting HPI.  Medications  cefTRIAXone  (ROCEPHIN ) 2 g in sodium chloride  0.9 % 100 mL IVPB (has no administration in time range)  azithromycin  (ZITHROMAX ) 500 mg in sodium chloride  0.9 % 250 mL IVPB (has no administration in time range)  metoprolol  tartrate  (LOPRESSOR ) 5 MG/5ML injection (has no administration in time range)  rosuvastatin  (CRESTOR ) tablet 10 mg (has no administration in time range)  rivaroxaban  (XARELTO ) tablet 20 mg (has no administration in time range)  fluticasone  furoate-vilanterol (BREO ELLIPTA ) 100-25 MCG/ACT 1 puff (has no administration in time range)  acetaminophen  (TYLENOL ) tablet 500 mg (has no administration in time range)  oxyCODONE  (Oxy IR/ROXICODONE ) immediate release tablet 5 mg (has no administration in time range)  HYDROmorphone  (DILAUDID ) injection 0.5 mg (has no administration in time range)  polyethylene glycol (MIRALAX  / GLYCOLAX ) packet 17 g (has no administration in time range)  prochlorperazine  (COMPAZINE ) injection 5 mg (has no administration in time range)  lactated ringers  bolus 1,000 mL (0 mLs Intravenous Stopped 05/14/24 2146)  sodium chloride  0.9 % bolus 1,000 mL (1,000 mLs Intravenous New Bag/Given 05/14/24 2212)  metoprolol  tartrate (LOPRESSOR ) injection 5 mg (5 mg Intravenous Given 05/14/24 2206)    Vitals:   05/14/24 2030 05/14/24 2045 05/14/24 2052 05/14/24 2211  BP: 104/81 110/87 110/87   Pulse:   (!) 146   Resp: 20 10 12    Temp:    97.7 F (36.5 C)  TempSrc:      SpO2:   100%   Weight:      Height:        Final diagnoses:  Disorientation  Pneumonia of left lower lobe due to infectious organism    Admission/ observation were discussed with the admitting physician, patient and/or family and they are comfortable with the plan.       Final diagnoses:  Disorientation  Pneumonia of left lower lobe due to infectious organism    ED Discharge Orders     None          Emil Share, DO 05/14/24 2228

## 2024-05-14 NOTE — ED Notes (Signed)
 Dr. Shona made aware of pts HR. EKG captured

## 2024-05-14 NOTE — Progress Notes (Signed)
 Patient ID: Rhonda Hickman, female   DOB: Nov 25, 1955, 68 y.o.   MRN: [id number] The patient is awake and alert this morning.  Like yesterday she is slow to respond to questions but does follow commands appropriately.  Her vital signs are completely stable.  Her right operative knee is stable.  Her labs are stable and the CT scan of her head was negative.  She can be discharged today to skilled nursing if the bed is available.

## 2024-05-14 NOTE — Telephone Encounter (Signed)
 Terri with Utilization would like an order for patient to be changed to inpatient.  CB# [phone].  Please advise.  Thank you.

## 2024-05-14 NOTE — TOC Transition Note (Signed)
 Transition of Care [facility]) - Discharge Note   Patient Details  Name: Rhonda Hickman MRN: [id number] Date of Birth: [birth year]  [facility] CM/SW Contact:  NORMAN ASPEN, LCSW Phone Number: 05/14/2024, 1:35 PM   Clinical Narrative:     Pt has accepted SNF bed at [facility] and facility can admit pt today.  Pt medically cleared and have received insurance authorization.  PTAR called at 1:35pm.  RN calling report to [phone].  No further IP CM needs.  Final next level of care: Skilled Nursing Facility Barriers to Discharge: Barriers Resolved   Patient Goals and CMS Choice Patient states their goals for this hospitalization and ongoing recovery are:: return home          Discharge Placement   Existing PASRR number confirmed : 05/12/24          Patient chooses bed at: [facility] Patient to be transferred to facility by: PTAR Name of family member notified: sister Patient and family notified of of transfer: 05/14/24  Discharge Plan and Services Additional resources added to the After Visit Summary for                  DME Arranged: N/A DME Agency: NA       HH Arranged: NA HH Agency: NA        Social Drivers of Health (SDOH) Interventions SDOH Screenings   Food Insecurity: No Food Insecurity (05/10/2024)  Housing: Low Risk  (05/10/2024)  Transportation Needs: No Transportation Needs (05/10/2024)  Utilities: Not At Risk (05/10/2024)  Depression (PHQ2-9): Low Risk  (03/29/2024)  Social Connections: Patient Unable To Answer (05/10/2024)  Tobacco Use: High Risk (05/10/2024)     Readmission Risk Interventions     No data to display

## 2024-05-14 NOTE — ED Notes (Signed)
 Replaced knee dressing with EDP at bedside

## 2024-05-14 NOTE — Progress Notes (Signed)
 Physical Therapy Treatment Patient Details Name: Rhonda Hickman MRN: [id number] DOB: Dec 04, 1955 Today's Date: 05/14/2024   History of Present Illness Pt s/p R TKR and with hx o fL TKR, CAD, CVA, bipolar, afib and neuropathy    PT Comments  POD # 4 Cognition Comments: Pt in bed sleepy/groggy and Non verbal throughout session with poor eye contact and only responding to painful stimuli (movement R LE). Mumbles a few untangable words.  NT reported Pt did not eat breakfast or lunch.  NT reported Total Assist to roll Pt side to side for peri care. General bed mobility comments: transfering to EOB required Total Assist + 2 using bed pad.  Pt offering 0%.  Screaming/grimacing in pain and unable to support self with static sitting.  Decreased functional use of R UE noted.  Pt does have a Hx CVA.   Cognition did NOT improve with actvity.  Remains mostly lethargic, impaired and groggy.  Severe RIGHT lean.  Unable to support self static sitting. General transfer comment: with + 3 assist, performed a lateral scoot from elevated bed to lower drop arm recliner using bed pad.  Maxi Move pad placed in recliner for nursing to use LIFT to get Pt back to bed.  Reviewed recent meds show Pt has NOT taken any pain medication to suspect reason for Pt's lethargy.  Did see Pt is taking 500 mg Depakote  every am and another 1000 mg at HS.  Discussed with RN.    LPT has rec Pt will need ST Rehab at SNF to address mobility and functional decline prior to safely returning home.    If plan is discharge home, recommend the following: Two people to help with walking and/or transfers;Two people to help with bathing/dressing/bathroom;Assistance with cooking/housework;Assist for transportation;Help with stairs or ramp for entrance   Can travel by private vehicle     No  Equipment Recommendations       Recommendations for Other Services       Precautions / Restrictions Precautions Precautions:  Fall;Knee Precaution/Restrictions Comments: no pillow under knee Restrictions Weight Bearing Restrictions Per Provider Order: No RLE Weight Bearing Per Provider Order: Weight bearing as tolerated     Mobility  Bed Mobility Overal bed mobility: Needs Assistance Bed Mobility: Supine to Sit     Supine to sit: Total assist, +2 for physical assistance, +2 for safety/equipment     General bed mobility comments: transfering to EOB required Total Assist + 2 using bed pad.  Pt offering 0%.  Screaming/grimacing in pain and unable to support self with static sitting.  Decreased functional use of R UE noted.  Pt does have a Hx CVA.   Cognition did NOT improve with actvity.  Remains mostly lethargic, impaired and groggy.  Severe RIGHT lean.  Unable to support self static sitting.    Transfers Overall transfer level: Needs assistance Equipment used: None Transfers: Bed to chair/wheelchair/BSC            Lateral/Scoot Transfers: +2 safety/equipment, From elevated surface, Total assist, +2 physical assistance General transfer comment: with + 3 assist, performed a lateral scoot from elevated bed to lower drop arm recliner using bed pad.  Maxi Move pad placed in recliner for nursing to use LIFT to get Pt back to bed.    Ambulation/Gait                   Optometrist  Tilt Bed    Modified Rankin (Stroke Patients Only)       Balance                                            Communication    Cognition Arousal: Lethargic Behavior During Therapy: Flat affect   PT - Cognitive impairments: History of cognitive impairments                       PT - Cognition Comments: Pt in bed sleepy/groggy and Non verbal throughout session with poor eye contact and only responding to painful stimuli (movement R LE). Mumbles a few untangable words.  NT reported Pt did not eat breakfast or lunch.  NT reported Total Assist to roll  Pt side to side for peri care. Following commands: Impaired      Cueing Cueing Techniques: Tactile cues, Gestural cues, Verbal cues  Exercises      General Comments        Pertinent Vitals/Pain Pain Assessment Pain Assessment: Faces Faces Pain Scale: Hurts even more Pain Location: R knee with movement Pain Descriptors / Indicators: Grimacing, Guarding, Crying Pain Intervention(s): Monitored during session, Repositioned    Home Living                          Prior Function            PT Goals (current goals can now be found in the care plan section) Progress towards PT goals: Progressing toward goals    Frequency    7X/week      PT Plan      Co-evaluation              AM-PAC PT 6 Clicks Mobility   Outcome Measure  Help needed turning from your back to your side while in a flat bed without using bedrails?: Total Help needed moving from lying on your back to sitting on the side of a flat bed without using bedrails?: Total Help needed moving to and from a bed to a chair (including a wheelchair)?: Total Help needed standing up from a chair using your arms (e.g., wheelchair or bedside chair)?: Total Help needed to walk in hospital room?: Total Help needed climbing 3-5 steps with a railing? : Total 6 Click Score: 6    End of Session Equipment Utilized During Treatment: Gait belt Activity Tolerance: Patient limited by lethargy Patient left: in chair;with call bell/phone within reach Nurse Communication: Mobility status;Need for lift equipment PT Visit Diagnosis: Unsteadiness on feet (R26.81);History of falling (Z91.81);Muscle weakness (generalized) (M62.81);Difficulty in walking, not elsewhere classified (R26.2);Pain Pain - Right/Left: Right Pain - part of body: Knee     Time: 8763-8697 PT Time Calculation (min) (ACUTE ONLY): 26 min  Charges:    $Therapeutic Activity: 23-37 mins PT General Charges $$ ACUTE PT VISIT: 1 Visit                      Katheryn Leap  PTA Acute  Rehabilitation Services Office M-F          [phone]

## 2024-05-14 NOTE — H&P (Incomplete)
 History and Physical  Rhonda Hickman FMW:[id number] DOB: [birth year] DOA: 05/14/2024  Referring physician: Dr. Emil, EDP  PCP: Cicero Aureliano SAUNDERS, MD  Outpatient Specialists: Orthopedic surgery. Patient coming from: SNF  Chief Complaint: Hypersomnolence and tachycardia.  HPI: Rhonda Hickman is a 68 y.o. female with medical history significant for severe OA status post left total knee replacement, obesity, COPD, type 2 diabetes, hypertension, hyperlipidemia, paroxysmal A-fib on Xarelto , who presents to the ER from SNF after being discharged today post right total knee replacement.  The patient was noted to be hypersomnolent and tachycardic with heart rate in the 150s.  EMS was activated and the patient was sent back to the hospital.        ED Course: ***  Review of Systems: Review of systems as noted in the HPI. All other systems reviewed and are negative.   Past Medical History:  Diagnosis Date   Anemia    Anxiety    Arthritis    Asthma    Atrial fibrillation (HCC)    Bipolar 1 disorder (HCC)    Bulging lumbar disc    Chronic pain of left knee    COPD (chronic obstructive pulmonary disease) (HCC)    Coronary artery disease    CVA (cerebral vascular accident) (HCC)    Diabetes mellitus    Dyspnea    Dysrhythmia    GERD (gastroesophageal reflux disease)    Neuropathy    TIA (transient ischemic attack)    Vertigo    Past Surgical History:  Procedure Laterality Date   ABDOMINAL HYSTERECTOMY     CARPAL TUNNEL RELEASE Right    CHOLECYSTECTOMY     KNEE ARTHROSCOPY WITH LATERAL MENISECTOMY Left 06/08/2021   Procedure: KNEE ARTHROSCOPY WITH LATERAL MENISCECTOMY AND MEDIAL MENISCECTOMY;  Surgeon: Margrette Taft BRAVO, MD;  Location: [facility];  Service: Orthopedics;  Laterality: Left;   KNEE ARTHROSCOPY WITH MEDIAL MENISECTOMY Right 04/14/2016   Procedure: KNEE ARTHROSCOPY WITH MEDIAL AND LATERAL MENISECTOMY, MICROFRACTURE REPAIR;  Surgeon: Taft BRAVO Margrette, MD;  Location: [facility];  Service: Orthopedics;  Laterality: Right;  lateral menisectomy - needs crutch training   TOTAL KNEE ARTHROPLASTY Left 06/20/2023   Procedure: LEFT TOTAL KNEE ARTHROPLASTY;  Surgeon: Vernetta Lonni GRADE, MD;  Location: [facility];  Service: Orthopedics;  Laterality: Left;   TOTAL KNEE ARTHROPLASTY Right 05/10/2024   Procedure: ARTHROPLASTY, KNEE, TOTAL;  Surgeon: Vernetta Lonni GRADE, MD;  Location: [facility];  Service: Orthopedics;  Laterality: Right;    Social History:  reports that she has been smoking cigarettes. She has never used smokeless tobacco. She reports that she does not drink alcohol and does not use drugs.   Allergies  Allergen Reactions   Aspirin  Nausea Only and Other (See Comments)    Causes stomach pain    Family History  Problem Relation Age of Onset   Diabetes Mother    Heart attack Father     ***  Prior to Admission medications   Medication Sig Start Date End Date Taking? Authorizing Provider  ACCU-CHEK GUIDE test strip CHECK BLOOD SUGAR UP TO FOUR TIMES DAILY AS DIRECTED 12/02/22   Celestia Rosaline SQUIBB, NP  amLODipine  (NORVASC ) 5 MG tablet Take 5 mg by mouth daily. 03/12/24   [provider]  blood glucose meter kit and supplies Dispense based on patient and insurance preference. Use up to four times daily as directed. (FOR ICD-10 E10.9, E11.9). 08/03/21   Celestia Rosaline SQUIBB, NP  budesonide -formoterol  (SYMBICORT ) 160-4.5 MCG/ACT inhaler Inhale 2 puffs into the  lungs 2 (two) times daily. 03/10/24   Celestia Rosaline SQUIBB, NP  buPROPion  (WELLBUTRIN  SR) 150 MG 12 hr tablet TAKE ONE TABLET TWICE DAILY 03/12/24   Celestia Rosaline SQUIBB, NP  divalproex  (DEPAKOTE  ER) 500 MG 24 hr tablet Take 500-1,000 mg by mouth See admin instructions. Take 500 mg by mouth in the morning and 1000 mg at bedtime 05/19/21   [provider]  FEROSUL 325 (65 Fe) MG tablet TAKE ONE TABLET BY MOUTH DAILY AT 9AM WITH BREAKFAST 02/16/23   Celestia Rosaline SQUIBB, NP  Insulin  Pen Needle (GNP  ULTICARE PEN NEEDLES) 32G X 4 MM MISC USE TO INJECT INSULIN  UP TO 5 TIMES A DAY 08/09/23   Celestia Rosaline SQUIBB, NP  Insulin  Syringe-Needle U-100 (ADVOCATE INSULIN  SYRINGE) 29G X 1/2 0.3 ML MISC Use to inject insulin  5x daily. 09/07/20   Celestia Rosaline SQUIBB, NP  LANTUS  SOLOSTAR 100 UNIT/ML Solostar Pen Inject 20 Units into the skin 2 (two) times daily.    [provider]  lidocaine  (LIDODERM ) 5 % Place 1 patch onto the skin daily. Remove & Discard patch within 12 hours or as directed by MD 10/04/23   Theadore Ozell HERO, MD  metFORMIN  (GLUCOPHAGE ) 1000 MG tablet Take 1 tablet (1,000 mg total) by mouth 2 (two) times daily. TAKE ONE TABLET BY MOUTH TWICE DAILY @ 9AM & 5PM WITH MEALS 08/09/23   Celestia Rosaline SQUIBB, NP  metoprolol  tartrate (LOPRESSOR ) 50 MG tablet TAKE ONE TABLET BY MOUTH TWICE DAILY @ 9AM & 5PM 05/19/23   Kate Lonni CROME, MD  oxybutynin  (DITROPAN ) 5 MG tablet Take 1 tablet (5 mg total) by mouth 2-3 (two to three) times daily for bladder spasms. 03/07/24   Celestia Rosaline SQUIBB, NP  oxyCODONE  (OXY IR/ROXICODONE ) 5 MG immediate release tablet Take 1-2 tablets (5-10 mg total) by mouth every 4 (four) hours as needed for moderate pain (pain score 4-6) (pain score 4-6). 05/13/24   Vernetta Lonni GRADE, MD  OZEMPIC , 0.25 OR 0.5 MG/DOSE, 2 MG/3ML SOPN INJECT 0.5MG  ONCE WEEKLY 04/10/24   Newlin, Enobong, MD  rivaroxaban  (XARELTO ) 20 MG TABS tablet TAKE ONE TABLET DAILY AT 5PM WITH SUPPER 03/12/24   Celestia Rosaline SQUIBB, NP  rosuvastatin  (CRESTOR ) 10 MG tablet TAKE ONE TABLET ONCE DAILY 03/13/24   Kate Lonni CROME, MD  tiZANidine (ZANAFLEX) 4 MG tablet Take 1 tablet (4 mg total) by mouth every 6 (six) hours as needed for muscle spasms. 05/13/24   Vernetta Lonni GRADE, MD    Physical Exam: BP 110/87   Pulse (!) 146   Temp 97.7 F (36.5 C)   Resp 12   Ht 6' (1.829 m)   Wt 106 kg   SpO2 100%   BMI 31.69 kg/m   General: 68 y.o. year-old female well developed well nourished in no  acute distress.  Alert and oriented x3. Cardiovascular: Regular rate and rhythm with no rubs or gallops.  No thyromegaly or JVD noted.  No lower extremity edema. 2/4 pulses in all 4 extremities. Respiratory: Clear to auscultation with no wheezes or rales. Good inspiratory effort. Abdomen: Soft nontender nondistended with normal bowel sounds x4 quadrants. Muskuloskeletal: No cyanosis, clubbing or edema noted bilaterally Neuro: CN II-XII intact, strength, sensation, reflexes Skin: No ulcerative lesions noted or rashes Psychiatry: Judgement and insight appear normal. Mood is appropriate for condition and setting          Labs on Admission:  Basic Metabolic Panel: Recent Labs  Lab 05/11/24 0313 05/13/24 0756 05/14/24 0326 05/14/24  1940 05/14/24 1953  NA 136 134* 133* 132* 135  K 4.9 4.6 4.4 5.9* 5.7*  CL 102 96* 96* 96*  --   CO2 26 23 23  20*  --   GLUCOSE 234* 105* 136* 110*  --   BUN 17 40* 52* 64*  --   CREATININE 0.90 1.52* 1.51* 1.64*  --   CALCIUM  9.0 9.5 9.2 8.2*  --    Liver Function Tests: Recent Labs  Lab 05/14/24 1940  AST 34  ALT 28  ALKPHOS 73  BILITOT 0.9  PROT 7.6  ALBUMIN 2.2*   No results for input(s): LIPASE, AMYLASE in the last 168 hours. Recent Labs  Lab 05/14/24 1940  AMMONIA 28   CBC: Recent Labs  Lab 05/11/24 0313 05/13/24 0756 05/14/24 1940 05/14/24 1953  WBC 11.4* 15.9* 13.7*  --   NEUTROABS  --   --  9.1*  --   HGB 9.8* 9.6* 9.6* 10.2*  HCT 32.1* 30.4* 29.8* 30.0*  MCV 82.3 80.2 80.5  --   PLT 269 291 334  --    Cardiac Enzymes: No results for input(s): CKTOTAL, CKMB, CKMBINDEX, TROPONINI in the last 168 hours.  BNP (last 3 results) No results for input(s): BNP in the last 8760 hours.  ProBNP (last 3 results) No results for input(s): PROBNP in the last 8760 hours.  CBG: Recent Labs  Lab 05/13/24 1148 05/13/24 1708 05/13/24 2100 05/14/24 0753 05/14/24 1203  GLUCAP 132* 146* 136* 155* 169*     Radiological Exams on Admission: CT Head Wo Contrast Result Date: 05/14/2024 EXAM: CT HEAD WITHOUT CONTRAST 05/14/2024 08:02:51 PM TECHNIQUE: CT of the head was performed without the administration of intravenous contrast. Automated exposure control, iterative reconstruction, and/or weight based adjustment of the mA/kV was utilized to reduce the radiation dose to as low as reasonably achievable. COMPARISON: 05/13/2024. CLINICAL HISTORY: Delirium. FINDINGS: BRAIN AND VENTRICLES: No acute hemorrhage. No evidence of acute infarct. No hydrocephalus. No extra-axial collection. No mass effect or midline shift. There is atrophy and chronic small vessel disease throughout the deep white matter. ORBITS: No acute abnormality. SINUSES: No acute abnormality. SOFT TISSUES AND SKULL: No acute soft tissue abnormality. No skull fracture. IMPRESSION: 1. No acute intracranial abnormality. 2. Atrophy and chronic small vessel disease throughout the deep white matter. Electronically signed by: Franky Crease MD 05/14/2024 08:08 PM EST RP Workstation: [id number]   DG Chest Port 1 View Result Date: 05/14/2024 EXAM: 1 VIEW(S) XRAY OF THE CHEST 05/14/2024 07:55:00 PM COMPARISON: 10/02/2023 CLINICAL HISTORY: Questionable sepsis - evaluate for abnormality FINDINGS: LUNGS AND PLEURA: Low lung volumes. Right basilar atelectasis. No pulmonary edema. No pleural effusion. No pneumothorax. HEART AND MEDIASTINUM: No acute abnormality of the cardiac and mediastinal silhouettes. BONES AND SOFT TISSUES: No acute osseous abnormality. IMPRESSION: 1. Low lung volumes with right basilar atelectasis. Electronically signed by: Franky Crease MD 05/14/2024 08:05 PM EST RP Workstation: [id number]   CT HEAD WO CONTRAST ( ) Result Date: 05/13/2024 EXAM: CT HEAD WITHOUT CONTRAST 05/13/2024 10:55:43 AM TECHNIQUE: CT of the head was performed without the administration of intravenous contrast. Automated exposure control, iterative reconstruction, and/or  weight based adjustment of the mA/kV was utilized to reduce the radiation dose to as low as reasonably achievable. COMPARISON: Head CT 05/22/2021 and MRI 06/16/2020. CLINICAL HISTORY: Mental status change, unknown cause. FINDINGS: BRAIN AND VENTRICLES: There is no evidence of an acute infarct, intracranial hemorrhage, mass, midline shift, hydrocephalus, or extra-axial fluid collection. Cerebral volume is within normal limits for age. Patchy  hypodensities in the cerebral white matter bilaterally are similar to the prior CT and nonspecific but compatible with moderate chronic small vessel ischemic disease. Calcified atherosclerosis at the skull base. ORBITS: Bilateral cataract extraction. SINUSES: No acute abnormality. SOFT TISSUES AND SKULL: No acute soft tissue abnormality. No skull fracture. IMPRESSION: 1. No acute intracranial abnormality. 2. Moderate chronic small vessel ischemic disease. Electronically signed by: Dasie Hamburg MD 05/13/2024 11:24 AM EST RP Workstation: [id number]    EKG: I independently viewed the EKG done and my findings are as followed: ***   Assessment/Plan Present on Admission:  AMS (altered mental status)  Active Problems:   AMS (altered mental status)   DVT prophylaxis: ***   Code Status: ***   Family Communication: ***   Disposition Plan: ***   Consults called: ***   Admission status: ***    Status is: Inpatient {Inpatient:23812}   Terry LOISE Hurst MD [facility] Pager [phone]  If 7PM-7AM, please contact night-coverage [email or web address] Password [email or web address]  05/14/2024, 10:13 PM

## 2024-05-15 ENCOUNTER — Inpatient Hospital Stay (HOSPITAL_COMMUNITY)

## 2024-05-15 DIAGNOSIS — I483 Typical atrial flutter: Secondary | ICD-10-CM

## 2024-05-15 DIAGNOSIS — I480 Paroxysmal atrial fibrillation: Secondary | ICD-10-CM | POA: Diagnosis not present

## 2024-05-15 DIAGNOSIS — K56609 Unspecified intestinal obstruction, unspecified as to partial versus complete obstruction: Secondary | ICD-10-CM | POA: Diagnosis not present

## 2024-05-15 DIAGNOSIS — G9341 Metabolic encephalopathy: Secondary | ICD-10-CM | POA: Diagnosis not present

## 2024-05-15 DIAGNOSIS — R4182 Altered mental status, unspecified: Secondary | ICD-10-CM | POA: Diagnosis not present

## 2024-05-15 LAB — COMPREHENSIVE METABOLIC PANEL WITH GFR
ALT: 20 U/L (ref 0–44)
AST: 19 U/L (ref 15–41)
Albumin: 2.3 g/dL — ABNORMAL LOW (ref 3.5–5.0)
Alkaline Phosphatase: 56 U/L (ref 38–126)
Anion gap: 17 — ABNORMAL HIGH (ref 5–15)
BUN: 46 mg/dL — ABNORMAL HIGH (ref 8–23)
CO2: 20 mmol/L — ABNORMAL LOW (ref 22–32)
Calcium: 7.5 mg/dL — ABNORMAL LOW (ref 8.9–10.3)
Chloride: 92 mmol/L — ABNORMAL LOW (ref 98–111)
Creatinine, Ser: 1.3 mg/dL — ABNORMAL HIGH (ref 0.44–1.00)
GFR, Estimated: 45 mL/min — ABNORMAL LOW (ref >60)
Glucose, Bld: 470 mg/dL — ABNORMAL HIGH (ref 70–99)
Potassium: 4.3 mmol/L (ref 3.5–5.1)
Sodium: 129 mmol/L — ABNORMAL LOW (ref 135–145)
Total Bilirubin: 0.5 mg/dL (ref 0.0–1.2)
Total Protein: 6.7 g/dL (ref 6.5–8.1)

## 2024-05-15 LAB — MAGNESIUM: Magnesium: 2.3 mg/dL (ref 1.7–2.4)

## 2024-05-15 LAB — LACTIC ACID, PLASMA
Lactic Acid, Venous: 2.3 mmol/L (ref 0.5–1.9)
Lactic Acid, Venous: 2.4 mmol/L (ref 0.5–1.9)
Lactic Acid, Venous: 2.5 mmol/L (ref 0.5–1.9)

## 2024-05-15 LAB — BASIC METABOLIC PANEL WITH GFR
Anion gap: 15 (ref 5–15)
BUN: 46 mg/dL — ABNORMAL HIGH (ref 8–23)
CO2: 18 mmol/L — ABNORMAL LOW (ref 22–32)
Calcium: 7.9 mg/dL — ABNORMAL LOW (ref 8.9–10.3)
Chloride: 98 mmol/L (ref 98–111)
Creatinine, Ser: 1.37 mg/dL — ABNORMAL HIGH (ref 0.44–1.00)
GFR, Estimated: 42 mL/min — ABNORMAL LOW (ref >60)
Glucose, Bld: 251 mg/dL — ABNORMAL HIGH (ref 70–99)
Potassium: 4.3 mmol/L (ref 3.5–5.1)
Sodium: 131 mmol/L — ABNORMAL LOW (ref 135–145)

## 2024-05-15 LAB — CBC WITH DIFFERENTIAL/PLATELET
Abs Immature Granulocytes: 0.05 K/uL (ref 0.00–0.07)
Basophils Absolute: 0 K/uL (ref 0.0–0.1)
Basophils Relative: 0 %
Eosinophils Absolute: 0 K/uL (ref 0.0–0.5)
Eosinophils Relative: 0 %
HCT: 23.9 % — ABNORMAL LOW (ref 36.0–46.0)
Hemoglobin: 7.6 g/dL — ABNORMAL LOW (ref 12.0–15.0)
Immature Granulocytes: 1 %
Lymphocytes Relative: 26 %
Lymphs Abs: 2.3 K/uL (ref 0.7–4.0)
MCH: 25.8 pg — ABNORMAL LOW (ref 26.0–34.0)
MCHC: 31.8 g/dL (ref 30.0–36.0)
MCV: 81 fL (ref 80.0–100.0)
Monocytes Absolute: 1.1 K/uL — ABNORMAL HIGH (ref 0.1–1.0)
Monocytes Relative: 12 %
Neutro Abs: 5.3 K/uL (ref 1.7–7.7)
Neutrophils Relative %: 61 %
Platelets: 283 K/uL (ref 150–400)
RBC: 2.95 MIL/uL — ABNORMAL LOW (ref 3.87–5.11)
RDW: 18 % — ABNORMAL HIGH (ref 11.5–15.5)
WBC: 8.8 K/uL (ref 4.0–10.5)
nRBC: 0.2 % (ref 0.0–0.2)

## 2024-05-15 LAB — I-STAT CG4 LACTIC ACID, ED: Lactic Acid, Venous: 3 mmol/L (ref 0.5–1.9)

## 2024-05-15 LAB — URINALYSIS, ROUTINE W REFLEX MICROSCOPIC
Bilirubin Urine: NEGATIVE
Glucose, UA: NEGATIVE mg/dL
Ketones, ur: NEGATIVE mg/dL
Nitrite: POSITIVE — AB
Protein, ur: NEGATIVE mg/dL
Specific Gravity, Urine: 1.025 (ref 1.005–1.030)
pH: 6 (ref 5.0–8.0)

## 2024-05-15 LAB — URINALYSIS, MICROSCOPIC (REFLEX): Bacteria, UA: NONE SEEN

## 2024-05-15 LAB — APTT: aPTT: 82 s — ABNORMAL HIGH (ref 24–36)

## 2024-05-15 LAB — HIV ANTIBODY (ROUTINE TESTING W REFLEX): HIV Screen 4th Generation wRfx: NONREACTIVE

## 2024-05-15 LAB — CBG MONITORING, ED
Glucose-Capillary: 177 mg/dL — ABNORMAL HIGH (ref 70–99)
Glucose-Capillary: 222 mg/dL — ABNORMAL HIGH (ref 70–99)

## 2024-05-15 LAB — CBC
HCT: 25.1 % — ABNORMAL LOW (ref 36.0–46.0)
Hemoglobin: 8.2 g/dL — ABNORMAL LOW (ref 12.0–15.0)
MCH: 26.1 pg (ref 26.0–34.0)
MCHC: 32.7 g/dL (ref 30.0–36.0)
MCV: 79.9 fL — ABNORMAL LOW (ref 80.0–100.0)
Platelets: 337 K/uL (ref 150–400)
RBC: 3.14 MIL/uL — ABNORMAL LOW (ref 3.87–5.11)
RDW: 18.3 % — ABNORMAL HIGH (ref 11.5–15.5)
WBC: 11.1 K/uL — ABNORMAL HIGH (ref 4.0–10.5)
nRBC: 0.3 % — ABNORMAL HIGH (ref 0.0–0.2)

## 2024-05-15 LAB — GLUCOSE, CAPILLARY
Glucose-Capillary: 153 mg/dL — ABNORMAL HIGH (ref 70–99)
Glucose-Capillary: 180 mg/dL — ABNORMAL HIGH (ref 70–99)
Glucose-Capillary: 175 mg/dL — ABNORMAL HIGH (ref 70–99)

## 2024-05-15 LAB — PHOSPHORUS: Phosphorus: 3.7 mg/dL (ref 2.5–4.6)

## 2024-05-15 LAB — HEPARIN LEVEL (UNFRACTIONATED): Heparin Unfractionated: 0.14 [IU]/mL — ABNORMAL LOW (ref 0.30–0.70)

## 2024-05-15 MED ORDER — AMIODARONE HCL IN DEXTROSE 360-4.14 MG/200ML-% IV SOLN
60.0000 mg/h | INTRAVENOUS | Status: AC
  Administered 2024-05-15: 60 mg/h via INTRAVENOUS
  Filled 2024-05-15 (×2): qty 200

## 2024-05-15 MED ORDER — SODIUM CHLORIDE 0.9 % IV BOLUS
250.0000 mL | INTRAVENOUS | Status: AC
  Administered 2024-05-15: 250 mL via INTRAVENOUS

## 2024-05-15 MED ORDER — INSULIN ASPART 100 UNIT/ML IJ SOLN
0.0000 [IU] | INTRAMUSCULAR | Status: DC
  Administered 2024-05-15 (×2): 2 [IU] via SUBCUTANEOUS
  Administered 2024-05-15: 3 [IU] via SUBCUTANEOUS
  Administered 2024-05-15 – 2024-05-16 (×2): 2 [IU] via SUBCUTANEOUS
  Administered 2024-05-16: 3 [IU] via SUBCUTANEOUS
  Administered 2024-05-16: 1 [IU] via SUBCUTANEOUS
  Administered 2024-05-16 (×4): 2 [IU] via SUBCUTANEOUS
  Administered 2024-05-17: 5 [IU] via SUBCUTANEOUS
  Administered 2024-05-17: 2 [IU] via SUBCUTANEOUS
  Administered 2024-05-17 (×2): 3 [IU] via SUBCUTANEOUS
  Administered 2024-05-17 – 2024-05-18 (×3): 2 [IU] via SUBCUTANEOUS
  Administered 2024-05-18: 1 [IU] via SUBCUTANEOUS
  Administered 2024-05-18 – 2024-05-19 (×6): 3 [IU] via SUBCUTANEOUS
  Administered 2024-05-19 – 2024-05-20 (×6): 2 [IU] via SUBCUTANEOUS
  Administered 2024-05-20 (×3): 3 [IU] via SUBCUTANEOUS
  Administered 2024-05-20: 5 [IU] via SUBCUTANEOUS
  Administered 2024-05-21: 3 [IU] via SUBCUTANEOUS
  Administered 2024-05-21 (×3): 2 [IU] via SUBCUTANEOUS
  Administered 2024-05-21 – 2024-05-22 (×2): 3 [IU] via SUBCUTANEOUS
  Administered 2024-05-22: 2 [IU] via SUBCUTANEOUS
  Administered 2024-05-22: 1 [IU] via SUBCUTANEOUS
  Administered 2024-05-22: 2 [IU] via SUBCUTANEOUS
  Filled 2024-05-15: qty 5
  Filled 2024-05-15 (×2): qty 2
  Filled 2024-05-15 (×2): qty 1
  Filled 2024-05-15 (×3): qty 3
  Filled 2024-05-15: qty 1
  Filled 2024-05-15: qty 3
  Filled 2024-05-15: qty 2
  Filled 2024-05-15: qty 1
  Filled 2024-05-15 (×2): qty 2
  Filled 2024-05-15: qty 3
  Filled 2024-05-15: qty 1
  Filled 2024-05-15: qty 2
  Filled 2024-05-15: qty 1
  Filled 2024-05-15: qty 2
  Filled 2024-05-15: qty 1
  Filled 2024-05-15 (×5): qty 3
  Filled 2024-05-15: qty 2
  Filled 2024-05-15: qty 1
  Filled 2024-05-15: qty 3
  Filled 2024-05-15: qty 1
  Filled 2024-05-15 (×2): qty 3
  Filled 2024-05-15 (×2): qty 2
  Filled 2024-05-15 (×2): qty 3
  Filled 2024-05-15: qty 1
  Filled 2024-05-15 (×2): qty 2

## 2024-05-15 MED ORDER — LORAZEPAM 2 MG/ML IJ SOLN: 0.5000 mg | INTRAMUSCULAR | Status: DC | PRN

## 2024-05-15 MED ORDER — HEPARIN (PORCINE) 25000 UT/250ML-% IV SOLN
1900.0000 [IU]/h | INTRAVENOUS | Status: DC
  Administered 2024-05-15: 1400 [IU]/h via INTRAVENOUS
  Administered 2024-05-16: 1500 [IU]/h via INTRAVENOUS
  Administered 2024-05-17: 1750 [IU]/h via INTRAVENOUS
  Administered 2024-05-17: 1950 [IU]/h via INTRAVENOUS
  Filled 2024-05-15 (×5): qty 250

## 2024-05-15 MED ORDER — AMIODARONE HCL IN DEXTROSE 360-4.14 MG/200ML-% IV SOLN
60.0000 mg/h | INTRAVENOUS | Status: DC
  Administered 2024-05-15 – 2024-05-18 (×11): 60 mg/h via INTRAVENOUS
  Filled 2024-05-15 (×11): qty 200

## 2024-05-15 MED ORDER — HYDRALAZINE HCL 20 MG/ML IJ SOLN: 10.0000 mg | Freq: Four times a day (QID) | INTRAMUSCULAR | Status: DC | PRN

## 2024-05-15 MED ORDER — DIATRIZOATE MEGLUMINE & SODIUM 66-10 % PO SOLN
90.0000 mL | Freq: Once | ORAL | Status: AC
  Administered 2024-05-15: 90 mL via NASOGASTRIC
  Filled 2024-05-15: qty 90

## 2024-05-15 MED ORDER — PIPERACILLIN-TAZOBACTAM 3.375 G IVPB
3.3750 g | Freq: Three times a day (TID) | INTRAVENOUS | Status: DC
  Administered 2024-05-15 – 2024-05-16 (×4): 3.375 g via INTRAVENOUS
  Filled 2024-05-15 (×4): qty 50

## 2024-05-15 MED ORDER — BISACODYL 10 MG RE SUPP
10.0000 mg | Freq: Once | RECTAL | Status: AC
  Administered 2024-05-15: 10 mg via RECTAL
  Filled 2024-05-15: qty 1

## 2024-05-15 MED ORDER — BISACODYL 10 MG RE SUPP: 10.0000 mg | Freq: Once | RECTAL | Status: DC

## 2024-05-15 MED ORDER — ALBUMIN HUMAN 25 % IV SOLN
25.0000 g | INTRAVENOUS | Status: AC
  Administered 2024-05-15: 12.5 g via INTRAVENOUS
  Filled 2024-05-15: qty 100

## 2024-05-15 NOTE — ED Notes (Addendum)
 Patient becoming restless and pulling at lines. MD notified. RN went to grab non violent restraints and patient's NG with less output after RN return to room. MD notified. XR placed for placement verification. NG tube suction turned off.

## 2024-05-15 NOTE — ED Notes (Signed)
 NG tube unclamped by RN and connected to low intermittent suction per provider order

## 2024-05-15 NOTE — ED Provider Notes (Signed)
  Physical Exam  BP 98/80   Pulse (!) 50   Temp 98.2 F (36.8 C) (Oral)   Resp (!) 28   Ht 6' (1.829 m)   Wt 106 kg   SpO2 100%   BMI 31.69 kg/m   Physical Exam  Procedures  .Ultrasound ED Peripheral IV (Provider)  Date/Time: 05/15/2024 2:26 AM  Performed by: Logan Ubaldo NOVAK, PA-C Authorized by: Logan Ubaldo NOVAK, PA-C   Procedure details:    Indications: poor IV access     Skin Prep: chlorhexidine  gluconate     Location:  Right forearm   Angiocath:  20 G   Bedside Ultrasound Guided: Yes     Images: archived     Patient tolerated procedure without complications: Yes     Dressing applied: Yes     ED Course / MDM    Medical Decision Making Amount and/or Complexity of Data Reviewed Labs: ordered. Radiology: ordered.  Risk Prescription drug management. Decision regarding hospitalization.          Logan Ubaldo NOVAK, PA-C 05/15/24 0226    Haze Lonni PARAS, MD 05/16/24 [phone]

## 2024-05-15 NOTE — Progress Notes (Signed)
 Patient ID: Rhonda Hickman, female   DOB: [birth year], 68 y.o.   MRN: [id number] Patient re-evaluated. She is now awake and talking. She reports no abdominal complaints and says everything was fine at the rehab. NGT with large output. ABD nontender. Continue SBO protocol. No emergent surgery needed.  Dann Hummer, MD, MPH, FACS Please use [email or web address] to contact on call provider

## 2024-05-15 NOTE — ED Notes (Signed)
 Dr. Shona at the bedside. Verbal order given for 5mg  Compazine  IV.

## 2024-05-15 NOTE — Progress Notes (Signed)
 PROGRESS NOTE  Rhonda Hickman  DOB: 1956-07-10  PCP: Cicero Aureliano SAUNDERS, MD FMW:[id number]  DOA: 05/14/2024  LOS: 1 day  Hospital Day: 2  Subjective: Patient was seen and examined this morning. Elderly African-American female.  Propped up in ED gurney. Somnolent.  Opens eyes on command.  Unable to respond.  But earlier, patient was agitated and pulling out lines per RN. Sister at bedside and another sister on the phone. I had a long conversation with family at bedside. Chart reviewed Overnight, afebrile, has been off-and-on tachycardic This morning, heart rate in 140s on amiodarone drip, tachypneic requiring 4 L oxygen , blood pressure 90s  Brief narrative: Rhonda Hickman is a 68 y.o. female with PMH significant for obesity, DM2, A-fib on Xarelto , CAD, CVA, CKD, COPD, anemia, anxiety, bipolar disorder, lumbar disc disease, severe OA status post left total knee replacement (06/20/23). Patient was living independently at home 10/30, admitted under orthopedic surgery and underwent right total knee replacement on 05/10/2024 by Dr. Vernetta. PT recommended SNF 10/4, patient was discharged to [facility]. Soon after she arrived at the facility, noted patient was hypersomnolent, tachycardic and sent her back to the ED.  In the ED, patient hypersomnolent, afebrile, A-fib with RVR in 140s, tachypneic to 20s, CT head unremarkable Initial labs with WC count 13.7, hemoglobin 9.6, potassium 5.9, BUN/creatinine 64/1.64, lactic acid 3.5 VBG with pH 7.47, pCO2 32 CT head did not show any acute abnormality, showed atrophy and chronic microvascular changes Chest x-ray with low lung volume and basilar atelectasis  Blood culture was sent Patient was started on Rocephin  IV antibiotics Was also started on IV amiodarone and IV heparin  drip  While in the ER, the patient had 1 episode of projectile vomiting which prompted CT abdomen and pelvis.   It revealed findings suspicious for distal small bowel  obstruction and moderate colonic stool burden.     Small bowel obstruction protocol was initiated with NG tube placement for suction in the ER.   General surgery was consulted.   Admitted by [facility]  Assessment and plan: Acute metabolic encephalopathy Anxiety/depression Per my discussion with patient family is at bedside, patient was somnolent, lethargic even prior to discharge.  They believe it is because of Depakote  which she was given scheduled twice a day.  They state she used to take it only PRN prior to that. I am unable to access and confirm that information from the last hospitalization. In any case, she is not on Depakote  or any mood altering medication at this time. Ammonia level at presentation was normal. CT head unremarkable Somnolent but gradually waking up today.   Fall and aspiration precautions. Continue to treat underlying conditions and monitor mental status. PTA meds- Wellbutrin  150 mg twice daily, Depakote  as needed Keep his meds on hold while mental status is impaired  A-fib with RVR H/o paroxysmal A-fib  Started on amiodarone drip and heparin  drip in the ED This morning, heart rate in 140s despite being on amiodarone drip cardiology consult has been called PTA meds- Lopressor , Xarelto  Xarelto  on hold while on heparin  drip Once able to take oral intake, will resume Lopressor    SBO Patient vomited in the ED.  CT abdomen pelvis with findings as above suggestive of SBO as well as moderate colonic stool burden Seen by general surgery.  NG tube started Small bowel protocol planned Continue to monitor To hold oral meds for now  Hyperkalemia Potassium level is improved on repeat labs this morning.  Continue to monitor electrolytes in  the setting of NG drainage Recent Labs  Lab 05/13/24 0756 05/14/24 0326 05/14/24 1940 05/14/24 1953 05/15/24 0429  K 4.6 4.4 5.9* 5.7* 4.3  MG  --   --   --   --  2.3  PHOS  --   --   --   --  3.7   Lactic acidosis Elevated  white count and WBC in the absence of infection. Abdomen not tender enough to consider mesenteric ischemia. Continue IV hydration and repeat labs. Currently on empiric IV Zosyn Recent Labs  Lab 05/11/24 0313 05/13/24 0756 05/14/24 1940 05/14/24 1951 05/15/24 0016 05/15/24 0429 05/15/24 0720  WBC 11.4* 15.9* 13.7*  --   --  8.8  --   LATICACIDVEN  --   --   --  3.5* 3.0* 2.3* 2.4*   Type 2 diabetes mellitus with hyperglycemia A1c 7.5 on 05/06/2024 PTA meds-Ozempic , Lantus  20 units twice daily, metformin  1000 mg twice daily Blood sugar trend noted as below. Start q 4 hours SSI/Accu-Cheks Recent Labs  Lab 05/13/24 2100 05/14/24 0753 05/14/24 1203 05/14/24 2323 05/15/24 0817  GLUCAP 136* 155* 169* 90 177*   Chronic HFpEF  Hypertension Euvolemic Closely monitor volume status while on IV fluid Last 2D echo done on 08/26/2022 revealed LVEF 55 to 60% with grade 1 diastolic dysfunction. Monitor strict I's and O's and daily weight. PTA meds-Toprol  50 mg twice daily, amlodipine  5 mg daily Oral meds currently on hold Because of low blood pressure this morning, I have kept her on maintenance fluid with NS at 150 mL/h IV hydralazine  as needed   AKI on CKD 2 Acute metabolic acidosis Patient's creatinine was normal at the beginning of last admission.  Seems to have gradually trended up.  Presented with creatinine of 1.64 yesterday. Monitor on IV hydration.  Recent Labs    06/16/23 1000 06/21/23 0656 06/28/23 1811 05/06/24 1357 05/11/24 0313 05/13/24 0756 05/14/24 0326 05/14/24 1940 05/15/24 0429  BUN 7* 11 7* 14 17 40* 52* 64* 46*  CREATININE 0.92 1.09* 1.03* 0.94 0.90 1.52* 1.51* 1.64* 1.30*  CO2 22 25 28 24 26 23 23  20* 20*   Acute on chronic anemia Baseline hemoglobin 9.  Hemoglobin dropped from 10.2 to 7.6 in the past 12 hours.  No evidence of active bleeding. Continue to monitor Was on iron supplement at home Repeat CBC at noon Recent Labs    05/11/24 0313  05/13/24 0756 05/14/24 1940 05/14/24 1953 05/15/24 0429  HGB 9.8* 9.6* 9.6* 10.2* 7.6*  MCV 82.3 80.2 80.5  --  81.0    Obesity 1 Body mass index is 31.69 kg/m. Patient has been advised to make an attempt to improve diet and exercise patterns to aid in weight loss. Ozempic  on hold   Osteoarthritis  s/p right total knee replacement (05/10/24) Needs PT eval once able to participate  pain regimen --- Scheduled: None --- PRN: Oxycodone  as needed, Dilaudid  0.5 mg IV every 4 hours    Mobility:   PT Orders: Active   PT Follow up Rec:     Goals of care   Code Status: Full Code     DVT prophylaxis: Currently on heparin  drip    Antimicrobials: IV Zosyn empiric Fluid: NS at 150 mL/h Consultants: Surgery Family Communication: one sister at bedside.  Another sister on the phone  Status: Inpatient Level of care:  Progressive   Patient is from: Was in SNF for less than an hour.  Was at home prior to last hospitalization  Needs  to continue in-hospital care: Ongoing workup and management Anticipated d/c to: Pending clinical course    Diet:  Diet Order             Diet NPO time specified  Diet effective now                   Scheduled Meds:  bisacodyl   10 mg Rectal Once   fluticasone  furoate-vilanterol  1 puff Inhalation Daily   insulin  aspart  0-9 Units Subcutaneous Q4H    PRN meds: acetaminophen , hydrALAZINE , HYDROmorphone  (DILAUDID ) injection, oxyCODONE , polyethylene glycol, prochlorperazine    Infusions:   sodium chloride  150 mL/hr at 05/15/24 0822   amiodarone 60 mg/hr (05/15/24 0806)   heparin  1,400 Units/hr (05/15/24 0222)   piperacillin-tazobactam (ZOSYN)  IV Stopped (05/15/24 0959)    Antimicrobials: Anti-infectives (From admission, onward)    Start     Dose/Rate Route Frequency Ordered Stop   05/15/24 0600  piperacillin-tazobactam (ZOSYN) IVPB 3.375 g        3.375 g 12.5 mL/hr over 240 Minutes Intravenous Every 8 hours 05/15/24 0031      05/14/24 2145  cefTRIAXone  (ROCEPHIN ) 2 g in sodium chloride  0.9 % 100 mL IVPB        2 g 200 mL/hr over 30 Minutes Intravenous  Once 05/14/24 2131 05/14/24 2357   05/14/24 2145  azithromycin  (ZITHROMAX ) 500 mg in sodium chloride  0.9 % 250 mL IVPB        500 mg 250 mL/hr over 60 Minutes Intravenous  Once 05/14/24 2131 05/15/24 0116       Objective: Vitals:   05/15/24 0915 05/15/24 0933  BP: 108/71   Pulse: (!) 106   Resp: (!) 28 (!) 23  Temp:    SpO2: 95%     Intake/Output Summary (Last 24 hours) at 05/15/2024 1055 Last data filed at 05/15/2024 9177 Gross per 24 hour  Intake 3166.61 ml  Output 2150 ml  Net 1016.61 ml   Filed Weights   05/14/24 1921  Weight: 106 kg   Weight change:  Body mass index is 31.69 kg/m.   Physical Exam: General exam: Pleasant, elderly African-American female Skin: No rashes, lesions or ulcers. HEENT: Atraumatic, normocephalic, no obvious bleeding Lungs: Clear to auscultation bilaterally,  CVS: A-fib with RVR, no murmur GI/Abd: Soft, mild diffuse tenderness, distended, bowel sound sluggish,   CNS: Somnolent, tries to open eyes on verbal command.  Unable to make motor movements on command. Extremities: No pedal edema, no calf tenderness,   Data Review: I have personally reviewed the laboratory data and studies available.  F/u labs ordered Unresulted Labs (From admission, onward)     Start     Ordered   05/16/24 0500  APTT  Daily,   R      05/15/24 0718   05/16/24 0500  Heparin  level (unfractionated)  Daily,   R      05/15/24 0718   05/16/24 0500  Basic metabolic panel with GFR  Tomorrow morning,   R        05/15/24 1055   05/16/24 0500  CBC with Differential/Platelet  Tomorrow morning,   R        05/15/24 1055   05/15/24 1200  CBC  Once-Timed,   TIMED        05/15/24 0815   05/15/24 1200  Basic metabolic panel with GFR  Once-Timed,   TIMED        05/15/24 0815   05/15/24 1200  Lactic acid, plasma  (Lactic  Acid)  Once-Timed,   TIMED         05/15/24 0818   05/15/24 1000  APTT  Once-Timed,   TIMED        05/15/24 0055   05/15/24 1000  Heparin  level (unfractionated)  Once-Timed,   TIMED        05/15/24 0055   05/14/24 1921  Urinalysis, w/ Reflex to Culture (Infection Suspected) -Urine, Clean Catch  (Undifferentiated presentation (screening labs and basic nursing orders))  ONCE - URGENT,   URGENT       Question:  Specimen Source  Answer:  Urine, Clean Catch   05/14/24 1921            Signed, Chapman Rota, MD [facility] 05/15/2024

## 2024-05-15 NOTE — Evaluation (Signed)
 Physical Therapy Evaluation Patient Details Name: Rhonda Hickman MRN: [id number] DOB: Jun 21, 1956 Today's Date: 05/15/2024  History of Present Illness  68 y.o. female presents to [facility] 05/14/24 from SNF due to hypersomnolence and tachycardia. Pt with suspicion for distal SBO. Of note, pt d/c from hospital 11/4 after R TKA. PMHx:  severe OA status post left total knee replacement (06/20/23), obesity, COPD, type 2 diabetes, hypertension, hyperlipidemia, paroxysmal A-fib on Xarelto , CKD 3B, TIA, CVA, neuropathy, vertigo   Clinical Impression  History taken from chart review as pt was not responding to questions. Prior to last hospital admit, pt ambulated limited distances with use of RW with hx of falls. After R TKA on 05/10/24, pt required +2 to +3 assist for OOB mobility. Pt presents with pain in R knee, impaired cognition/alertness, and decreased strength. Unable to formally assess strength as pt did not respond to commands for MMT and resisted R LE PROM. Pt crying out in pain with light touch to R LE and with movement. Required TotalAx2 to roll for pericare and linen change with pt then able to maintain sidelying position with CGA. Attempted to move into long-sitting with hands on bilateral rails and MaxAx2 to support trunk. Continue to recommend <3hrs post acute rehab with acute PT to follow.   .  HR 120s-140s BPM 99% SpO2 on RA      If plan is discharge home, recommend the following: Two people to help with walking and/or transfers;Two people to help with bathing/dressing/bathroom;Assistance with cooking/housework;Assist for transportation;Help with stairs or ramp for entrance   Can travel by private vehicle   No    Equipment Recommendations None recommended by PT     Functional Status Assessment Patient has had a recent decline in their functional status and demonstrates the ability to make significant improvements in function in a reasonable and predictable amount of time.     Precautions  / Restrictions Precautions Precautions: Fall Recall of Precautions/Restrictions: Impaired Precaution/Restrictions Comments: NG tube, tachycardia, R TKA on 10/31 Required Braces or Orthoses: Knee Immobilizer - Right Knee Immobilizer - Right: Discontinue once straight leg raise with < 10 degree lag (per prior PT notes) Restrictions Weight Bearing Restrictions Per Provider Order: No      Mobility  Bed Mobility Overal bed mobility: Needs Assistance Bed Mobility: Supine to Sit, Rolling Rolling: Total assist, +2 for physical assistance, +2 for safety/equipment, Used rails   Supine to sit: Max assist, +2 for physical assistance, +2 for safety/equipment, Used rails    General bed mobility comments: moved into long-sitting in stretcher with assist to keep hands on rails and MaxAx2 to support trunk. TotalAx2 to roll with pt then able to support self in side-lying    Transfers  General transfer comment: Deferred 2/2 safety         Pertinent Vitals/Pain Pain Assessment Pain Assessment: Faces Faces Pain Scale: Hurts whole lot Pain Location: R LE with movement Pain Descriptors / Indicators: Grimacing, Guarding, Crying Pain Intervention(s): Limited activity within patient's tolerance, Monitored during session, Repositioned    Home Living Family/patient expects to be discharged to:: Private residence Living Arrangements: Alone Available Help at Discharge: Personal care attendant Type of Home: Apartment Home Access: Level entry       Home Layout: One level Home Equipment: Agricultural Consultant (2 wheels);Toilet riser;Tub bench;Grab bars - tub/shower;Adaptive equipment;Hand held shower head;BSC/3in1      Prior Function Prior Level of Function : Needs assist      Mobility Comments: Per chart review, prior to last  admit pt had limited ambulation using RW with mulitple falls. Was requiring +2 to +3 assist for mobility after R TKA ADLs Comments: Per chart review, prior to last admit pt had  PCA assist with bathing, shower transfers, occasional LB dressing, and with meals/cleaning. Assume assist needed at [facility]     Extremity/Trunk Assessment   Upper Extremity Assessment Upper Extremity Assessment: Defer to OT evaluation    Lower Extremity Assessment Lower Extremity Assessment: RLE deficits/detail RLE Deficits / Details: pt not following commands to MMT, only followed commands to ankle DF/PF. Cried out with attempts at PROM RLE: Unable to fully assess due to pain    Cervical / Trunk Assessment Cervical / Trunk Assessment: Other exceptions Cervical / Trunk Exceptions: increased body habitus  Communication   Communication Communication: Impaired Factors Affecting Communication: Reduced clarity of speech    Cognition Arousal: Lethargic Behavior During Therapy: Flat affect   PT - Cognitive impairments: No family/caregiver present to determine baseline, Difficult to assess Difficult to assess due to: Level of arousal (limited verbalizations)    PT - Cognition Comments: Flucutated between alertness and lethargy. Would occasionally verbalize to state no or I don't want to talk. Following commands: Impaired Following commands impaired: Follows one step commands inconsistently, Follows one step commands with increased time     Cueing Cueing Techniques: Tactile cues, Gestural cues, Verbal cues      PT Assessment Patient needs continued PT services  PT Problem List Decreased strength;Decreased range of motion;Decreased activity tolerance;Decreased balance;Decreased mobility;Decreased knowledge of use of DME;Decreased safety awareness;Pain;Obesity       PT Treatment Interventions DME instruction;Gait training;Functional mobility training;Therapeutic activities;Therapeutic exercise;Balance training;Patient/family education;Neuromuscular re-education;Wheelchair mobility training    PT Goals (Current goals can be found in the Care Plan section)  Acute Rehab PT  Goals Patient Stated Goal: unable to state goal PT Goal Formulation: Patient unable to participate in goal setting Time For Goal Achievement: 05/29/24 Potential to Achieve Goals: Fair    Frequency Min 2X/week     Co-evaluation   Reason for Co-Treatment: Necessary to address cognition/behavior during functional activity;For patient/therapist safety;To address functional/ADL transfers PT goals addressed during session: Mobility/safety with mobility         AM-PAC PT 6 Clicks Mobility  Outcome Measure Help needed turning from your back to your side while in a flat bed without using bedrails?: Total Help needed moving from lying on your back to sitting on the side of a flat bed without using bedrails?: Total Help needed moving to and from a bed to a chair (including a wheelchair)?: Total Help needed standing up from a chair using your arms (e.g., wheelchair or bedside chair)?: Total Help needed to walk in hospital room?: Total Help needed climbing 3-5 steps with a railing? : Total 6 Click Score: 6    End of Session   Activity Tolerance: Patient limited by lethargy;Patient limited by pain Patient left: in bed;with call bell/phone within reach;with restraints reapplied Nurse Communication: Mobility status;Other (comment) (RN present during eval) PT Visit Diagnosis: Unsteadiness on feet (R26.81);History of falling (Z91.81);Muscle weakness (generalized) (M62.81);Difficulty in walking, not elsewhere classified (R26.2);Pain Pain - Right/Left: Right Pain - part of body: Knee    Time: 8884-8867 PT Time Calculation (min) (ACUTE ONLY): 17 min   Charges:   PT Evaluation $PT Eval Moderate Complexity: 1 Mod   PT General Charges $$ ACUTE PT VISIT: 1 Visit        Kate ORN, PT, DPT Secure Chat Preferred  Rehab Office [phone]  Quintana Canelo E Wendolyn 05/15/2024, 1:27 PM

## 2024-05-15 NOTE — ED Notes (Signed)
 MD at bedside.

## 2024-05-15 NOTE — ED Notes (Signed)
 XR at bedside

## 2024-05-15 NOTE — ED Notes (Signed)
 Per MD Dahal, no changes needed for NG placement.

## 2024-05-15 NOTE — ED Notes (Signed)
 MD notified of UA not collected. Per MD do not need at this time.

## 2024-05-15 NOTE — ED Notes (Signed)
 Upon RN assuming care at 0700, amio still going at 60mg /hr. Per night shift RN, night shift admitting doc wanted patient to remain at 60mg /hr instead of titrating down. Day shift MD Dahal contacted by RN regarding order.

## 2024-05-15 NOTE — Progress Notes (Signed)
 PHARMACY - ANTICOAGULATION CONSULT NOTE  Pharmacy Consult for xarelto  >> heparin  Indication: atrial fibrillation  Allergies  Allergen Reactions   Aspirin  Nausea Only and Other (See Comments)    Causes stomach pain    Patient Measurements: Height: 6' (182.9 cm) Weight: 106 kg (233 lb 11 oz) IBW/kg (Calculated) : 73.1 HEPARIN  DW (KG): 95.8  Vital Signs: Temp: 97.7 F (36.5 C) (11/04 2211) Temp Source: Oral (11/04 1932) BP: 113/88 (11/04 2345) Pulse Rate: 50 (11/04 2345)  Labs: Recent Labs    05/13/24 0756 05/14/24 0326 05/14/24 1940 05/14/24 1953  HGB 9.6*  --  9.6* 10.2*  HCT 30.4*  --  29.8* 30.0*  PLT 291  --  334  --   CREATININE 1.52* 1.51* 1.64*  --     Estimated Creatinine Clearance: 45.3 mL/min (A) (by C-G formula based on SCr of 1.64 mg/dL (H)).   Medical History: Past Medical History:  Diagnosis Date   Anemia    Anxiety    Arthritis    Asthma    Atrial fibrillation (HCC)    Bipolar 1 disorder (HCC)    Bulging lumbar disc    Chronic pain of left knee    COPD (chronic obstructive pulmonary disease) (HCC)    Coronary artery disease    CVA (cerebral vascular accident) (HCC)    Diabetes mellitus    Dyspnea    Dysrhythmia    GERD (gastroesophageal reflux disease)    Neuropathy    TIA (transient ischemic attack)    Vertigo    Assessment: 30 yoF presented with AMS after recent d/c on 11/4 s/p right knee replacement. Pharmacy consulted to dose heparin  until patient able to take xarelto  for afib.  -Hgb 10, plts WNL -Per facility did not receive evening dose of xarelto , last dose documented 11/3 at [facility]  Goal of Therapy:  Heparin  level 0.3-0.7 units/ml aPTT 66-102 seconds Monitor platelets by anticoagulation protocol: Yes   Plan:  -No bolus given recent Xarelto  use -Heparin  1400 units/hr -8h aPTT and heparin  level -Monitor with aPTTs until correlates with heparin  level -CBC daily -Follow up ability to transition back to Xarelto   Lynwood Poplar, PharmD, BCPS Clinical Pharmacist 05/15/2024 12:54 AM

## 2024-05-15 NOTE — ED Notes (Signed)
 NG tube clamped and unhooked from suction. Radiology made aware.

## 2024-05-15 NOTE — ED Notes (Signed)
 Patient changed into new gown and linen by RN, OT, and PT

## 2024-05-15 NOTE — ED Notes (Signed)
 Notified admitting MD of pts HR in 140s. MD advised not to titrate Amiodarone as previous order states.

## 2024-05-15 NOTE — Progress Notes (Signed)
 received patient from er NG documented 32 cm and was at 22 cm . tried to re advance and started coming out patients mouth and she started projectile vomiting green bile. MD notified   Lynnette Cena Helling, RN

## 2024-05-15 NOTE — ED Notes (Addendum)
 This RN was at the bedside attempting in & out cath, pt had sudden projectile vomiting; approx , dark in color. Dr hall made aware.

## 2024-05-15 NOTE — Evaluation (Signed)
 Occupational Therapy Evaluation Patient Details Name: Rhonda Hickman MRN: [id number] DOB: 1956/03/06 Today's Date: 05/15/2024   History of Present Illness   68 y.o. female presents to [facility] 05/14/24 from SNF due to hypersomnolence and tachycardia. Pt with suspicion for distal SBO. Of note, pt d/c from hospital 11/4 after R TKA. PMHx:  severe OA status post left total knee replacement (06/20/23), obesity, COPD, type 2 diabetes, hypertension, hyperlipidemia, paroxysmal A-fib on Xarelto , CKD 3B, TIA, CVA, neuropathy, vertigo     Clinical Impressions PTA, pt recently at [facility] for rehab per chart review. Pt reporting she does not want to talk on arrival, follows simple one step commands inconsistently. Pt max soiled in urine on arrival and needing max education to be willing for rolling for clean up. Upon continued eval, pt with limitations in orientation, command following, strength. Pt pulled into long sit in bed with max-total A as well as rolling with max-total A and total A for pericare. Intermittent lethargy noted throughout session. Will continue to follow and progress mobility/functional return to BADL as able. Patient will benefit from continued inpatient follow up therapy, <3 hours/day      If plan is discharge home, recommend the following:   Two people to help with walking and/or transfers;Two people to help with bathing/dressing/bathroom;Assistance with cooking/housework;Direct supervision/assist for medications management;Assist for transportation;Direct supervision/assist for financial management;Help with stairs or ramp for entrance;Assistance with feeding     Functional Status Assessment   Patient has had a recent decline in their functional status and demonstrates the ability to make significant improvements in function in a reasonable and predictable amount of time.     Equipment Recommendations   Other (comment) (defer)     Recommendations for Other Services          Precautions/Restrictions   Precautions Precautions: Fall Recall of Precautions/Restrictions: Impaired Precaution/Restrictions Comments: NG tube, tachycardia, R TKA on 10/31 Required Braces or Orthoses: Knee Immobilizer - Right Knee Immobilizer - Right: Discontinue once straight leg raise with < 10 degree lag (per prior PT notes) Restrictions Weight Bearing Restrictions Per Provider Order: No     Mobility Bed Mobility Overal bed mobility: Needs Assistance Bed Mobility: Supine to Sit, Rolling Rolling: Total assist, +2 for physical assistance, +2 for safety/equipment, Used rails   Supine to sit: Max assist, +2 for physical assistance, +2 for safety/equipment, Used rails     General bed mobility comments: moved into long-sitting in stretcher with assist to keep hands on rails and MaxAx2 to support trunk. TotalAx2 to roll with pt then able to support self in side-lying    Transfers                   General transfer comment: Deferred 2/2 safety and elevated HR up to 148 with minimal movement/interaction      Balance                                           ADL either performed or assessed with clinical judgement   ADL Overall ADL's : Needs assistance/impaired Eating/Feeding: NPO   Grooming: Moderate assistance;Bed level       Lower Body Bathing: Total assistance;+2 for physical assistance;+2 for safety/equipment;Bed level               Toileting- Clothing Manipulation and Hygiene: Total assistance;+2 for physical assistance;+2 for safety/equipment;Bed level  Vision   Additional Comments: to be assessed at a later date. Tracks therapist in room, but not reporting on vision during interview     Perception         Praxis         Pertinent Vitals/Pain Pain Assessment Pain Assessment: Faces Faces Pain Scale: Hurts whole lot Pain Location: R LE with movement Pain Descriptors / Indicators: Grimacing, Guarding,  Crying Pain Intervention(s): Limited activity within patient's tolerance, Monitored during session     Extremity/Trunk Assessment Upper Extremity Assessment Upper Extremity Assessment: Generalized weakness;Difficult to assess due to impaired cognition   Lower Extremity Assessment Lower Extremity Assessment: Defer to PT evaluation RLE Deficits / Details: pt not following commands to MMT, only followed commands to ankle DF/PF. Cried out with attempts at PROM RLE: Unable to fully assess due to pain   Cervical / Trunk Assessment Cervical / Trunk Assessment: Other exceptions Cervical / Trunk Exceptions: increased body habitus   Communication Communication Communication: Impaired Factors Affecting Communication: Reduced clarity of speech   Cognition Arousal: Lethargic Behavior During Therapy: Flat affect Cognition: Cognition impaired, Difficult to assess, No family/caregiver present to determine baseline Difficult to assess due to: Level of arousal Orientation impairments: Place, Time, Situation     Attention impairment (select first level of impairment): Focused attention Executive functioning impairment (select all impairments): Organization, Sequencing, Problem solving, Reasoning, Initiation OT - Cognition Comments: pt needs one step commands to follow commands appropriately. pt with minimal verbalizations reporting she does not want to speak. pt asking where she is halfway through session, after OT oriented pt on arrival to session                 Following commands: Impaired Following commands impaired: Follows one step commands inconsistently, Follows one step commands with increased time     Cueing  General Comments   Cueing Techniques: Tactile cues;Gestural cues;Verbal cues      Exercises     Shoulder Instructions      Home Living Family/patient expects to be discharged to:: Private residence Living Arrangements: Alone Available Help at Discharge: Personal  care attendant Type of Home: Apartment Home Access: Level entry     Home Layout: One level     Bathroom Shower/Tub: Chief Strategy Officer: Standard Bathroom Accessibility: Yes   Home Equipment: Agricultural Consultant (2 wheels);Toilet riser;Tub bench;Grab bars - tub/shower;Adaptive equipment;Hand held shower head;BSC/3in1 Adaptive Equipment: Reacher;Other (Comment) (uses back scratcher as dressing stick) Additional Comments: above information gleaned from chart review. pt recently at [facility] rehab.      Prior Functioning/Environment Prior Level of Function : Needs assist             Mobility Comments: Per chart review, prior to last admit pt had limited ambulation using RW with mulitple falls. Was requiring +2 to +3 assist for mobility after R TKA ADLs Comments: Per chart review, prior to last admit pt had PCA assist with bathing, shower transfers, occasional LB dressing, and with meals/cleaning. Assume assist needed at SNF    OT Problem List: Decreased strength;Decreased activity tolerance;Impaired balance (sitting and/or standing);Decreased range of motion;Decreased cognition;Decreased knowledge of use of DME or AE;Decreased safety awareness;Decreased knowledge of precautions;Pain   OT Treatment/Interventions: Self-care/ADL training;Therapeutic exercise;DME and/or AE instruction;Cognitive remediation/compensation;Therapeutic activities;Patient/family education;Balance training      OT Goals(Current goals can be found in the care plan section)   Acute Rehab OT Goals Patient Stated Goal: pt did not state OT Goal Formulation: With patient Time For Goal  Achievement: 05/29/24 Potential to Achieve Goals: Fair   OT Frequency:  Min 2X/week    Co-evaluation PT/OT/SLP Co-Evaluation/Treatment: Yes Reason for Co-Treatment: Necessary to address cognition/behavior during functional activity;For patient/therapist safety;To address functional/ADL transfers PT goals addressed  during session: Mobility/safety with mobility OT goals addressed during session: ADL's and self-care      AM-PAC OT 6 Clicks Daily Activity     Outcome Measure Help from another person eating meals?: Total Help from another person taking care of personal grooming?: A Lot Help from another person toileting, which includes using toliet, bedpan, or urinal?: Total Help from another person bathing (including washing, rinsing, drying)?: A Lot Help from another person to put on and taking off regular upper body clothing?: A Lot Help from another person to put on and taking off regular lower body clothing?: Total 6 Click Score: 9   End of Session Nurse Communication: Mobility status  Activity Tolerance: Patient limited by lethargy Patient left: in bed;with call bell/phone within reach;with nursing/sitter in room;with restraints reapplied  OT Visit Diagnosis: Unsteadiness on feet (R26.81);Muscle weakness (generalized) (M62.81);Other symptoms and signs involving cognitive function;Pain                Time: 1110-1131 OT Time Calculation (min): 21 min Charges:  OT General Charges $OT Visit: 1 Visit OT Evaluation $OT Eval Moderate Complexity: 1 Mod  Elma JONETTA Lebron FREDERICK, OTR/L [facility] Acute Rehabilitation Office: [phone]   Elma JONETTA Lebron 05/15/2024, 1:59 PM

## 2024-05-15 NOTE — ED Notes (Signed)
 Radiology called to notify that NG is unclamped

## 2024-05-15 NOTE — Consult Note (Signed)
 Cardiology Consultation  Patient ID: Rhonda Hickman MRN: [id number]; DOB: 1955/12/01  Admit date: 05/14/2024 Date of Consult: 05/15/2024  PCP:  Rhonda Aureliano SAUNDERS, MD   [facility] Providers Cardiologist:  Rhonda LITTIE [name], MD     Patient Profile: Rhonda Hickman is a 68 y.o. female with a hx of TIA, paroxysmal atrial fibrillation, BPD, COPD, T2DM, asthma, hyperlipidemia, tobacco use, mild OSA no indication for CPAP, hypertension  who is being seen 05/15/2024 for the evaluation of A-Fib with RVR at the request of Dr. Arlice.  History of Present Illness: Rhonda Hickman has past medical history as stated above.  She presented to the [facility] emergency department on 05/14/2024 with altered mental status.  She recently had a right knee replaced, since then has been living in a rehab facility.  Reportedly she has not been acting quite right since her surgery.  The decision was made for her to get sent to the emergency department for evaluation.  She was noted to be significantly altered upon arrival.  While in the ER, she was hypersomnolent, tachypneic, tachycardic, noted to be in A-fib with RVR.  She had notable rales on exam per the primary team.  She was started on IV antibiotics as well as IV amiodarone and heparin  to help control her heart rate in the setting of A-fib.    While she was down in the emergency department she had an episode of projectile vomiting, this prompted her to get a CT abdomen/pelvis.  The findings of this imaging were suspicious for a distal SBO.  SBO protocol was initiated with an NG tube with suction.  General surgery was then consulted, they noted that no emergent surgery was indicated.   Relevant ED since in the ED includes: Lactic acid originally elevated at 3.5, down to 2.5 today, metabolic panel showed AKI with initial creatinine 1.52, baseline 0.90, down to 1.30 today.  CBC showed leukocytosis, this has since cleared, patient noted to be anemic today  with hemoglobin 7.6, down from 10.2 yesterday.  CXR showed low lung volumes, right basilar atelectasis.  CT head showed no acute intracranial abnormality, CT abdomen/pelvis showed suspicion for distal SBO, moderate colonic stool burden.  Recent cardiac imaging includes: Coronary CTA showed: Calcium  score 54.5, 80th percentile, no evidence of obstructive CAD.Echocardiogram from February 2024 showed: LVEF 55 to 60%, G1 DD, normal RV systolic function, mildly dilated LA, normal IVC. Long term monitor from 05/2020 showed no significant arrhythmias with 1% atrial flutter burden  She follows closely with Dr. [name], last seen October 2024.  At this appointment she was being seen preop to her knee surgery.  In regards to her atrial fibrillation, she was continued on Lopressor  50 mg twice daily, Xarelto  20 mg daily.  For her hypertension she is continued on Lopressor  50 mg twice daily, amlodipine  5 mg daily.  Cardiology was asked to consult in the setting of A-fib with RVR.   Upon review of telemetry, it appears that patient is in atrial flutter with rapid rates.  Upon my interview of the patient, she is altered and unable to answer any my questions.  When I ask her a few questions she is only able to intermittently moan anything I am saying.  There is no other family or nursing staff present in the room.  All HPI was obtained via chart review.  Past Medical History:  Diagnosis Date   Anemia    Anxiety    Arthritis    Asthma    Atrial  fibrillation (HCC)    Bipolar 1 disorder (HCC)    Bulging lumbar disc    Chronic pain of left knee    COPD (chronic obstructive pulmonary disease) (HCC)    Coronary artery disease    CVA (cerebral vascular accident) (HCC)    Diabetes mellitus    Dyspnea    Dysrhythmia    GERD (gastroesophageal reflux disease)    Neuropathy    TIA (transient ischemic attack)    Vertigo    Past Surgical History:  Procedure Laterality Date   ABDOMINAL HYSTERECTOMY     CARPAL  TUNNEL RELEASE Right    CHOLECYSTECTOMY     KNEE ARTHROSCOPY WITH LATERAL MENISECTOMY Left 06/08/2021   Procedure: KNEE ARTHROSCOPY WITH LATERAL MENISCECTOMY AND MEDIAL MENISCECTOMY;  Surgeon: Rhonda Rhonda BRAVO, MD;  Location: [facility];  Service: Orthopedics;  Laterality: Left;   KNEE ARTHROSCOPY WITH MEDIAL MENISECTOMY Right 04/14/2016   Procedure: KNEE ARTHROSCOPY WITH MEDIAL AND LATERAL MENISECTOMY, MICROFRACTURE REPAIR;  Surgeon: Rhonda Hickman Margrette, MD;  Location: [facility];  Service: Orthopedics;  Laterality: Right;  lateral menisectomy - needs crutch training   TOTAL KNEE ARTHROPLASTY Left 06/20/2023   Procedure: LEFT TOTAL KNEE ARTHROPLASTY;  Surgeon: Rhonda Rhonda GRADE, MD;  Location: [facility];  Service: Orthopedics;  Laterality: Left;   TOTAL KNEE ARTHROPLASTY Right 05/10/2024   Procedure: ARTHROPLASTY, KNEE, TOTAL;  Surgeon: Rhonda Rhonda GRADE, MD;  Location: [facility];  Service: Orthopedics;  Laterality: Right;    Home Medications:  Prior to Admission medications   Medication Sig Start Date End Date Taking? Authorizing Provider  amLODipine  (NORVASC ) 5 MG tablet Take 5 mg by mouth daily. 03/12/24   [provider]  budesonide -formoterol  (SYMBICORT ) 160-4.5 MCG/ACT inhaler Inhale 2 puffs into the lungs 2 (two) times daily. 03/10/24   Rhonda Rosaline SQUIBB, NP  buPROPion  (WELLBUTRIN  SR) 150 MG 12 hr tablet TAKE ONE TABLET TWICE DAILY 03/12/24   Rhonda Rosaline SQUIBB, NP  divalproex  (DEPAKOTE  ER) 500 MG 24 hr tablet Take 500-1,000 mg by mouth See admin instructions. Take 500 mg by mouth in the morning and 1000 mg at bedtime 05/19/21   [provider]  FEROSUL 325 (65 Fe) MG tablet TAKE ONE TABLET BY MOUTH DAILY AT 9AM WITH BREAKFAST 02/16/23   Rhonda Rosaline SQUIBB, NP  LANTUS  SOLOSTAR 100 UNIT/ML Solostar Pen Inject 20 Units into the skin 2 (two) times daily.    [provider]  lidocaine  (LIDODERM ) 5 % Place 1 patch onto the skin daily. Remove & Discard patch within 12 hours  or as directed by MD 10/04/23   Rhonda Ozell HERO, MD  metFORMIN  (GLUCOPHAGE ) 1000 MG tablet Take 1 tablet (1,000 mg total) by mouth 2 (two) times daily. TAKE ONE TABLET BY MOUTH TWICE DAILY @ 9AM & 5PM WITH MEALS 08/09/23   Rhonda Rosaline SQUIBB, NP  metoprolol  tartrate (LOPRESSOR ) 50 MG tablet TAKE ONE TABLET BY MOUTH TWICE DAILY @ 9AM & 5PM 05/19/23   Kate Rhonda CROME, MD  oxybutynin  (DITROPAN ) 5 MG tablet Take 1 tablet (5 mg total) by mouth 2-3 (two to three) times daily for bladder spasms. 03/07/24   Rhonda Rosaline SQUIBB, NP  oxyCODONE  (OXY IR/ROXICODONE ) 5 MG immediate release tablet Take 1-2 tablets (5-10 mg total) by mouth every 4 (four) hours as needed for moderate pain (pain score 4-6) (pain score 4-6). 05/13/24   Rhonda Rhonda GRADE, MD  OZEMPIC , 0.25 OR 0.5 MG/DOSE, 2 MG/3ML SOPN INJECT 0.5MG  ONCE WEEKLY 04/10/24   Newlin, Enobong, MD  rivaroxaban  (  XARELTO ) 20 MG TABS tablet TAKE ONE TABLET DAILY AT 5PM WITH SUPPER 03/12/24   Rhonda Rosaline SQUIBB, NP  rosuvastatin  (CRESTOR ) 10 MG tablet TAKE ONE TABLET ONCE DAILY 03/13/24   Kate Rhonda CROME, MD  tiZANidine (ZANAFLEX) 4 MG tablet Take 1 tablet (4 mg total) by mouth every 6 (six) hours as needed for muscle spasms. 05/13/24   Rhonda Rhonda GRADE, MD    Scheduled Meds:  fluticasone  furoate-vilanterol  1 puff Inhalation Daily   insulin  aspart  0-9 Units Subcutaneous Q4H   Continuous Infusions:  sodium chloride  150 mL/hr at 05/15/24 9177   amiodarone 60 mg/hr (05/15/24 0806)   heparin  1,400 Units/hr (05/15/24 1112)   piperacillin-tazobactam (ZOSYN)  IV 3.375 g (05/15/24 1329)   PRN Meds: acetaminophen , hydrALAZINE , HYDROmorphone  (DILAUDID ) injection, oxyCODONE , polyethylene glycol, prochlorperazine   Allergies:    Allergies  Allergen Reactions   Aspirin  Nausea Only and Other (See Comments)    Causes stomach pain    Social History:   Social History   Socioeconomic History   Marital status: Single    Spouse name: Not on  file   Number of children: 0   Years of education: 12   Highest education level: High school graduate  Occupational History   Occupation: Unemployed  Tobacco Use   Smoking status: Some Days    Current packs/day: 0.50    Types: Cigarettes   Smokeless tobacco: Never   Tobacco comments:    tobacco info given  Vaping Use   Vaping status: Former  Substance and Sexual Activity   Alcohol use: No   Drug use: No   Sexual activity: Never  Other Topics Concern   Not on file  Social History Narrative   Lives at home alone.   Right-handed.   No caffeine use.   Social Drivers of Corporate Investment Banker Strain: Not on file  Food Insecurity: No Food Insecurity (05/10/2024)   Hunger Vital Sign    Worried About Running Out of Food in the Last Year: Never true    Ran Out of Food in the Last Year: Never true  Transportation Needs: No Transportation Needs (05/10/2024)   PRAPARE - Administrator, Civil Service (Medical): No    Lack of Transportation (Non-Medical): No  Physical Activity: Not on file  Stress: Not on file  Social Connections: Patient Unable To Answer (05/10/2024)   Social Connection and Isolation Panel    Frequency of Communication with Friends and Family: Patient unable to answer    Frequency of Social Gatherings with Friends and Family: Patient unable to answer    Attends Religious Services: Patient unable to answer    Active Member of Clubs or Organizations: Patient unable to answer    Attends Banker Meetings: Patient unable to answer    Marital Status: Patient unable to answer  Intimate Partner Violence: Not At Risk (05/10/2024)   Humiliation, Afraid, Rape, and Kick questionnaire    Fear of Current or Ex-Partner: No    Emotionally Abused: No    Physically Abused: No    Sexually Abused: No    Family History:   Family History  Problem Relation Age of Onset   Diabetes Mother    Heart attack Father     ROS:  Please see the history of  present illness.  All other ROS reviewed and negative.     Physical Exam/Data: Vitals:   05/15/24 1120 05/15/24 1215 05/15/24 1415 05/15/24 1435  BP:  134/83 (!) 112/90  Pulse:  76 67   Resp:  (!) 22 20   Temp: 97.7 F (36.5 C)   98.5 F (36.9 C)  TempSrc: Oral   Oral  SpO2:  100% 97%   Weight:      Height:        Intake/Output Summary (Last 24 hours) at 05/15/2024 1448 Last data filed at 05/15/2024 1112 Gross per 24 hour  Intake 3288.34 ml  Output 2150 ml  Net 1138.34 ml      05/14/2024    7:21 PM 05/10/2024    7:09 AM 05/06/2024    1:21 PM  Last 3 Weights  Weight (lbs) 233 lb 11 oz 233 lb 11 oz 233 lb 11 oz  Weight (kg) 106 kg 106 kg 106 kg     Body mass index is 31.69 kg/m.   General: resting in bed, obtunded, NG tube present HEENT: normal Neck: no JVD Vascular: No carotid bruits; Distal pulses 2+ bilaterally Cardiac: Tachycardic Lungs: Mild rales present Abd: soft, nontender, no hepatomegaly  Ext: no edema Musculoskeletal:  No deformities, BUE and BLE strength normal and equal Skin: warm and dry  Neuro: Obtunded, somnolent, unable to answer any questions  Telemetry:  Telemetry was personally reviewed and demonstrates: Atrial flutter, HR 130s to 140s, frequent PVCs  Relevant CV Studies:  Echocardiogram, 08/26/2022 Left ventricular ejection fraction, by estimation, is 55 to 60% . The left ventricle has normal function. The left ventricle has no regional wall motion abnormalities. There is mild concentric left ventricular hypertrophy. Left ventricular diastolic parameters are consistent with Hickman I diastolic dysfunction ( impaired relaxation) .  Right ventricular systolic function is normal. The right ventricular size is normal.  Left atrial size was mildly dilated.  The mitral valve is normal in structure. Trivial mitral valve regurgitation. No evidence of mitral stenosis.  The aortic valve is tricuspid. Aortic valve regurgitation is not visualized. No aortic  stenosis is present.  The inferior vena cava is normal in size with greater than 50% respiratory variability, suggesting right atrial pressure of 3 mmHg.  Coronary CTA, 11/03/2022 Calcium  score 54.5 which is 80 th percentile for age/sex Normal ascending thoracic aorta 3.6 cm CAD RADS 1 non obstructive CAD see description above Lipomatous hypertrophy of the atrial septum   Laboratory Data: High Sensitivity Troponin:  No results for input(s): TROPONINIHS in the last 720 hours.   Chemistry Recent Labs  Lab 05/14/24 1940 05/14/24 1953 05/15/24 0429 05/15/24 1215  NA 132* 135 129* 131*  K 5.9* 5.7* 4.3 4.3  CL 96*  --  92* 98  CO2 20*  --  20* 18*  GLUCOSE 110*  --  470* 251*  BUN 64*  --  46* 46*  CREATININE 1.64*  --  1.30* 1.37*  CALCIUM  8.2*  --  7.5* 7.9*  MG  --   --  2.3  --   GFRNONAA 34*  --  45* 42*  ANIONGAP 16*  --  17* 15    Recent Labs  Lab 05/14/24 1940 05/15/24 0429  PROT 7.6 6.7  ALBUMIN 2.2* 2.3*  AST 34 19  ALT 28 20  ALKPHOS 73 56  BILITOT 0.9 0.5   Lipids No results for input(s): CHOL, TRIG, HDL, LABVLDL, LDLCALC, CHOLHDL in the last 168 hours.  Hematology Recent Labs  Lab 05/14/24 1940 05/14/24 1953 05/15/24 0429 05/15/24 1215  WBC 13.7*  --  8.8 11.1*  RBC 3.70*  --  2.95* 3.14*  HGB 9.6* 10.2* 7.6* 8.2*  HCT  29.8* 30.0* 23.9* 25.1*  MCV 80.5  --  81.0 79.9*  MCH 25.9*  --  25.8* 26.1  MCHC 32.2  --  31.8 32.7  RDW 18.2*  --  18.0* 18.3*  PLT 334  --  283 337   Thyroid  No results for input(s): TSH, FREET4 in the last 168 hours.  BNPNo results for input(s): BNP, PROBNP in the last 168 hours.  DDimer No results for input(s): DDIMER in the last 168 hours.  Radiology/Studies:  DG Abd Portable 1 View Result Date: 05/15/2024 CLINICAL DATA:  NG tube placement EXAM: PORTABLE ABDOMEN - 1 VIEW COMPARISON:  05/15/2024 FINDINGS: Similarly positioned esophagogastric tube, with the tip obscured by enteric contrast in the  stomach. The last side hole remains at the level of the GE junction. Similar gaseous distention of the small bowel in the upper abdomen.No pneumoperitoneum. Cholecystectomy clips. No acute fracture or destructive lesion. IMPRESSION: 1. Similarly positioned esophagogastric tube, with the tip obscured by enteric contrast in the stomach. 2. Redemonstrated gas-filled and dilated small bowel in the upper abdomen. Electronically Signed   By: Rogelia Myers M.D.   On: 05/15/2024 09:02   DG Abd Portable 1V-Small Bowel Protocol-Position Verification Result Date: 05/15/2024 EXAM: 1 VIEW XRAY OF THE ABDOMEN 05/15/2024 03:54:00 AM COMPARISON: CT abdomen and pelvis 08/06/2023. CLINICAL HISTORY: Encounter for imaging study to confirm nasogastric (NG) tube placement. FINDINGS: LINES, TUBES AND DEVICES: Enteric tube in place with tip overlying the expected region of the gastric lumen and side port overlying the expected region of the gastroesophageal junction. BOWEL: Several loops of mildly dilated small bowel within the abdomen. SOFT TISSUES: Right upper quadrant surgical clips noted. No opaque urinary calculi. BONES: No acute osseous abnormality. IMPRESSION: 1. Enteric tube side port at the gastroesophageal junction, recommend advancement of 5 cm to ensure side hole placement within the stomach 2. Stable bowel gas pattern from earlier CT. Electronically signed by: Helayne Hurst MD 05/15/2024 04:26 AM EST RP Workstation: [id number]   CT CHEST ABDOMEN PELVIS WO CONTRAST Result Date: 05/15/2024 EXAM: CT CHEST, ABDOMEN AND PELVIS WITHOUT CONTRAST 05/15/2024 01:35:12 AM TECHNIQUE: CT of the chest, abdomen and pelvis was performed without the administration of intravenous contrast. Multiplanar reformatted images are provided for review. Automated exposure control, iterative reconstruction, and/or weight based adjustment of the mA/kV was utilized to reduce the radiation dose to as low as reasonably achievable. COMPARISON: CT heart  11/03/2022, CT abdomen and pelvis 05/22/2021. CLINICAL HISTORY: Sepsis. FINDINGS: CHEST: MEDIASTINUM AND LYMPH NODES: Scattered calcifications in the left anterior descending coronary artery. Pericardium is unremarkable. The central airways are clear. No mediastinal, hilar or axillary lymphadenopathy. LUNGS AND PLEURA: Lower lobe atelectasis bilaterally. No focal consolidation or pulmonary edema. No pleural effusion or pneumothorax. ABDOMEN AND PELVIS: LIVER: The liver is unremarkable. GALLBLADDER AND BILE DUCTS: Prior cholecystectomy. No biliary ductal dilatation. SPLEEN: No acute abnormality. PANCREAS: No acute abnormality. ADRENAL GLANDS: No acute abnormality. KIDNEYS, URETERS AND BLADDER: No stones in the kidneys or ureters. No hydronephrosis. No perinephric or periureteral stranding. Air within the urinary bladder, presumably from recent catheterization. GI AND BOWEL: Stomach demonstrates no acute abnormality. Mildly dilated fluid-filled small bowel loops into the pelvis. Distal small bowel is decompressed. Cannot exclude distal small bowel obstruction. Moderate stool burden throughout the colon. Normal appendix. REPRODUCTIVE ORGANS: No acute abnormality. PERITONEUM AND RETROPERITONEUM: Small amount of free fluid in the cul-de-sac. No free air. VASCULATURE: Aortic atherosclerosis. Aorta is normal in caliber. ABDOMINAL AND PELVIS LYMPH NODES: No lymphadenopathy. BONES AND SOFT  TISSUES: No acute osseous abnormality. No focal soft tissue abnormality. IMPRESSION: 1. Findings suspicious for distal small bowel obstruction, given mildly dilated fluid-filled small bowel loops with decompressed distal small bowel. 2. Moderate colonic stool burden. 3. Coronary artery disease, aortic atherosclerosis Electronically signed by: Franky Crease MD 05/15/2024 01:39 AM EST RP Workstation: [id number]   CT Head Wo Contrast Result Date: 05/14/2024 EXAM: CT HEAD WITHOUT CONTRAST 05/14/2024 08:02:51 PM TECHNIQUE: CT of the head was  performed without the administration of intravenous contrast. Automated exposure control, iterative reconstruction, and/or weight based adjustment of the mA/kV was utilized to reduce the radiation dose to as low as reasonably achievable. COMPARISON: 05/13/2024. CLINICAL HISTORY: Delirium. FINDINGS: BRAIN AND VENTRICLES: No acute hemorrhage. No evidence of acute infarct. No hydrocephalus. No extra-axial collection. No mass effect or midline shift. There is atrophy and chronic small vessel disease throughout the deep white matter. ORBITS: No acute abnormality. SINUSES: No acute abnormality. SOFT TISSUES AND SKULL: No acute soft tissue abnormality. No skull fracture. IMPRESSION: 1. No acute intracranial abnormality. 2. Atrophy and chronic small vessel disease throughout the deep white matter. Electronically signed by: Franky Crease MD 05/14/2024 08:08 PM EST RP Workstation: [id number]   DG Chest Port 1 View Result Date: 05/14/2024 EXAM: 1 VIEW(S) XRAY OF THE CHEST 05/14/2024 07:55:00 PM COMPARISON: 10/02/2023 CLINICAL HISTORY: Questionable sepsis - evaluate for abnormality FINDINGS: LUNGS AND PLEURA: Low lung volumes. Right basilar atelectasis. No pulmonary edema. No pleural effusion. No pneumothorax. HEART AND MEDIASTINUM: No acute abnormality of the cardiac and mediastinal silhouettes. BONES AND SOFT TISSUES: No acute osseous abnormality. IMPRESSION: 1. Low lung volumes with right basilar atelectasis. Electronically signed by: Franky Crease MD 05/14/2024 08:05 PM EST RP Workstation: [id number]   CT HEAD WO CONTRAST ( ) Result Date: 05/13/2024 EXAM: CT HEAD WITHOUT CONTRAST 05/13/2024 10:55:43 AM TECHNIQUE: CT of the head was performed without the administration of intravenous contrast. Automated exposure control, iterative reconstruction, and/or weight based adjustment of the mA/kV was utilized to reduce the radiation dose to as low as reasonably achievable. COMPARISON: Head CT 05/22/2021 and MRI 06/16/2020.  CLINICAL HISTORY: Mental status change, unknown cause. FINDINGS: BRAIN AND VENTRICLES: There is no evidence of an acute infarct, intracranial hemorrhage, mass, midline shift, hydrocephalus, or extra-axial fluid collection. Cerebral volume is within normal limits for age. Patchy hypodensities in the cerebral white matter bilaterally are similar to the prior CT and nonspecific but compatible with moderate chronic small vessel ischemic disease. Calcified atherosclerosis at the skull base. ORBITS: Bilateral cataract extraction. SINUSES: No acute abnormality. SOFT TISSUES AND SKULL: No acute soft tissue abnormality. No skull fracture. IMPRESSION: 1. No acute intracranial abnormality. 2. Moderate chronic small vessel ischemic disease. Electronically signed by: Dasie Hamburg MD 05/13/2024 11:24 AM EST RP Workstation: [id number]   Assessment and Plan:  Atrial flutter with RVR History of paroxysmal atrial fibrillation Previously noted to have history of PAF Home medications include: Lopressor  50 mg twice daily, Xarelto  20 mg daily Unable to retrieve any history from the patient during my exam, therefore cannot confirm/deny if patient was taking these medications or missed any doses Started on IV amiodarone, IV heparin , agree with this plan Continue to monitor on telemetry Continue to replenish electrolytes as needed, K > 4, Mag > 2 Suspect that some of her tachycardia, atrial flutter/fib is from her underlying acute condition, believe this may improve once SBO has resolved  Per primary Acute metabolic encephalopathy Mood disorders SBO Electrolyte disturbances Type 2 diabetes AKI on CKD Acute metabolic acidosis Hypertension  Risk Assessment/Risk Scores:     CHA2DS2-VASc Score = 7   This indicates a 11.2% annual risk of stroke. The patient's score is based upon: CHF History: 0 HTN History: 1 Diabetes History: 1 Stroke History: 2 Vascular Disease History: 1 Age Score: 1 Gender Score: 1         For questions or updates, please contact [facility] Please consult [email or web address] for contact info under      Signed, Waddell DELENA Donath, PA-C  05/15/2024 2:48 PM

## 2024-05-15 NOTE — Consult Note (Signed)
 Consulting Physician: Deward PARAS Sky Borboa  Referring Provider: Dr. Shona  Chief Complaint: Obtunded  Reason for Consult: Bowel obstruction   Subjective   HPI: Rhonda Hickman is an 68 y.o. female who is here for altered mental status.  She vomited and was found to have possible small bowel obstruction on CT.  She is not communicating and has no family at bedside during my evaluation.  She had knee surgery two weeks ago and is recovering in rehab and was directed to the ER from SNF.  Past Medical History:  Diagnosis Date   Anemia    Anxiety    Arthritis    Asthma    Atrial fibrillation (HCC)    Bipolar 1 disorder (HCC)    Bulging lumbar disc    Chronic pain of left knee    COPD (chronic obstructive pulmonary disease) (HCC)    Coronary artery disease    CVA (cerebral vascular accident) (HCC)    Diabetes mellitus    Dyspnea    Dysrhythmia    GERD (gastroesophageal reflux disease)    Neuropathy    TIA (transient ischemic attack)    Vertigo     Past Surgical History:  Procedure Laterality Date   ABDOMINAL HYSTERECTOMY     CARPAL TUNNEL RELEASE Right    CHOLECYSTECTOMY     KNEE ARTHROSCOPY WITH LATERAL MENISECTOMY Left 06/08/2021   Procedure: KNEE ARTHROSCOPY WITH LATERAL MENISCECTOMY AND MEDIAL MENISCECTOMY;  Surgeon: Margrette Taft BRAVO, MD;  Location: [facility];  Service: Orthopedics;  Laterality: Left;   KNEE ARTHROSCOPY WITH MEDIAL MENISECTOMY Right 04/14/2016   Procedure: KNEE ARTHROSCOPY WITH MEDIAL AND LATERAL MENISECTOMY, MICROFRACTURE REPAIR;  Surgeon: Taft BRAVO Margrette, MD;  Location: [facility];  Service: Orthopedics;  Laterality: Right;  lateral menisectomy - needs crutch training   TOTAL KNEE ARTHROPLASTY Left 06/20/2023   Procedure: LEFT TOTAL KNEE ARTHROPLASTY;  Surgeon: Vernetta Lonni GRADE, MD;  Location: [facility];  Service: Orthopedics;  Laterality: Left;   TOTAL KNEE ARTHROPLASTY Right 05/10/2024   Procedure: ARTHROPLASTY, KNEE, TOTAL;  Surgeon: Vernetta Lonni GRADE, MD;  Location: [facility];  Service: Orthopedics;  Laterality: Right;    Family History  Problem Relation Age of Onset   Diabetes Mother    Heart attack Father     Social:  reports that she has been smoking cigarettes. She has never used smokeless tobacco. She reports that she does not drink alcohol and does not use drugs.  Allergies:  Allergies  Allergen Reactions   Aspirin  Nausea Only and Other (See Comments)    Causes stomach pain    Medications: Current Outpatient Medications  Medication Instructions   amLODipine  (NORVASC ) 5 mg, Oral, Daily   budesonide -formoterol  (SYMBICORT ) 160-4.5 MCG/ACT inhaler 2 puffs, Inhalation, 2 times daily   buPROPion  (WELLBUTRIN  SR) 150 mg, Oral, 2 times daily   divalproex  (DEPAKOTE  ER) 500-1,000 mg, Oral, See admin instructions, Take 500 mg by mouth in the morning and 1000 mg at bedtime   FEROSUL 325 (65 Fe) MG tablet TAKE ONE TABLET BY MOUTH DAILY AT 9AM WITH BREAKFAST   Lantus  SoloStar 20 Units, Subcutaneous, 2 times daily   lidocaine  (LIDODERM ) 5 % 1 patch, Transdermal, Every 24 hours, Remove & Discard patch within 12 hours or as directed by MD   metFORMIN  (GLUCOPHAGE ) 1,000 mg, Oral, 2 times daily, TAKE ONE TABLET BY MOUTH TWICE DAILY @ 9AM & 5PM WITH MEALS   metoprolol  tartrate (LOPRESSOR ) 50 MG tablet TAKE ONE TABLET BY MOUTH TWICE DAILY @ 9AM &  5PM   oxybutynin  (DITROPAN ) 5 MG tablet Take 1 tablet (5 mg total) by mouth 2-3 (two to three) times daily for bladder spasms.   oxyCODONE  (OXY IR/ROXICODONE ) 5-10 mg, Oral, Every 4 hours PRN   OZEMPIC , 0.25 OR 0.5 MG/DOSE, 2 MG/3ML SOPN INJECT 0.5MG  ONCE WEEKLY   rivaroxaban  (XARELTO ) 20 MG TABS tablet TAKE ONE TABLET DAILY AT 5PM WITH SUPPER   rosuvastatin  (CRESTOR ) 10 mg, Oral, Daily   tiZANidine (ZANAFLEX) 4 mg, Oral, Every 6 hours PRN    ROS - all of the below systems have been reviewed with the patient and positives are indicated with bold text General: chills, fever or night  sweats Eyes: blurry vision or double vision ENT: epistaxis or sore throat Allergy/Immunology: itchy/watery eyes or nasal congestion Hematologic/Lymphatic: bleeding problems, blood clots or swollen lymph nodes Endocrine: temperature intolerance or unexpected weight changes Breast: new or changing breast lumps or nipple discharge Resp: cough, shortness of breath, or wheezing CV: chest pain or dyspnea on exertion GI: as per HPI GU: dysuria, trouble voiding, or hematuria MSK: joint pain or joint stiffness Neuro: TIA or stroke symptoms Derm: pruritus and skin lesion changes Psych: anxiety and depression  Objective   PE Blood pressure 108/71, pulse (!) 106, temperature 97.7 F (36.5 C), temperature source Oral, resp. rate (!) 23, height 6' (1.829 m), weight 106 kg, SpO2 95%. Constitutional: obtunded Eyes: Moist conjunctiva; no lid lag; anicteric; PERRL Neck: Trachea midline; no thyromegaly Lungs: Normal respiratory effort; no tactile fremitus CV: RRR; no palpable thrills; no pitting edema GI: Abd Soft, moderate distention, no severe tenderness; no palpable hepatosplenomegalyPsychiatric: Appropriate affect; alert and oriented x3 Lymphatic: No palpable cervical or axillary lymphadenopathy  Results for orders placed or performed during the hospital encounter of 05/14/24 (from the past 24 hours)  Comprehensive metabolic panel     Status: Abnormal   Collection Time: 05/14/24  7:40 PM  Result Value Ref Range   Sodium 132 (L) 135 - 145 mmol/L   Potassium 5.9 (H) 3.5 - 5.1 mmol/L   Chloride 96 (L) 98 - 111 mmol/L   CO2 20 (L) 22 - 32 mmol/L   Glucose, Bld 110 (H) 70 - 99 mg/dL   BUN 64 (H) 8 - 23 mg/dL   Creatinine, Ser 8.35 (H) 0.44 - 1.00 mg/dL   Calcium  8.2 (L) 8.9 - 10.3 mg/dL   Total Protein 7.6 6.5 - 8.1 g/dL   Albumin 2.2 (L) 3.5 - 5.0 g/dL   AST 34 15 - 41 U/L   ALT 28 0 - 44 U/L   Alkaline Phosphatase 73 38 - 126 U/L   Total Bilirubin 0.9 0.0 - 1.2 mg/dL   GFR, Estimated 34  (L) >60 mL/min   Anion gap 16 (H) 5 - 15  CBC with Differential     Status: Abnormal   Collection Time: 05/14/24  7:40 PM  Result Value Ref Range   WBC 13.7 (H) 4.0 - 10.5 K/uL   RBC 3.70 (L) 3.87 - 5.11 MIL/uL   Hemoglobin 9.6 (L) 12.0 - 15.0 g/dL   HCT 70.1 (L) 63.9 - 53.9 %   MCV 80.5 80.0 - 100.0 fL   MCH 25.9 (L) 26.0 - 34.0 pg   MCHC 32.2 30.0 - 36.0 g/dL   RDW 81.7 (H) 88.4 - 84.4 %   Platelets 334 150 - 400 K/uL   nRBC 0.4 (H) 0.0 - 0.2 %   Neutrophils Relative % 67 %   Neutro Abs 9.1 (H) 1.7 - 7.7  K/uL   Lymphocytes Relative 23 %   Lymphs Abs 3.1 0.7 - 4.0 K/uL   Monocytes Relative 9 %   Monocytes Absolute 1.2 (H) 0.1 - 1.0 K/uL   Eosinophils Relative 0 %   Eosinophils Absolute 0.1 0.0 - 0.5 K/uL   Basophils Relative 0 %   Basophils Absolute 0.0 0.0 - 0.1 K/uL   Immature Granulocytes 1 %   Abs Immature Granulocytes 0.11 (H) 0.00 - 0.07 K/uL  Blood Culture (routine x 2)     Status: None (Preliminary result)   Collection Time: 05/14/24  7:40 PM   Specimen: BLOOD LEFT ARM  Result Value Ref Range   Specimen Description BLOOD LEFT ARM    Special Requests      BOTTLES DRAWN AEROBIC AND ANAEROBIC Blood Culture adequate volume   Culture      NO GROWTH < 12 HOURS Performed at [facility], [address], [place], [place] [address]    Report Status PENDING   Ammonia     Status: None   Collection Time: 05/14/24  7:40 PM  Result Value Ref Range   Ammonia 28 9 - 35 umol/L  Blood Culture (routine x 2)     Status: None (Preliminary result)   Collection Time: 05/14/24  7:41 PM   Specimen: BLOOD RIGHT ARM  Result Value Ref Range   Specimen Description BLOOD RIGHT ARM    Special Requests      BOTTLES DRAWN AEROBIC AND ANAEROBIC Blood Culture adequate volume   Culture      NO GROWTH < 12 HOURS Performed at [facility], [address] [place], [place] [address]    Report Status PENDING   I-Stat Lactic Acid, ED     Status: Abnormal   Collection Time:  05/14/24  7:51 PM  Result Value Ref Range   Lactic Acid, Venous 3.5 (HH) 0.5 - 1.9 mmol/L   Comment NOTIFIED PHYSICIAN   I-Stat venous blood gas, ED     Status: Abnormal   Collection Time: 05/14/24  7:53 PM  Result Value Ref Range   pH, Ven 7.474 (H) 7.25 - 7.43   pCO2, Ven 31.6 (L) 44 - 60 mmHg   pO2, Ven 41 32 - 45 mmHg   Bicarbonate 23.2 20.0 - 28.0 mmol/L   TCO2 24 22 - 32 mmol/L   O2 Saturation 81 %   Acid-Base Excess 0.0 0.0 - 2.0 mmol/L   Sodium 135 135 - 145 mmol/L   Potassium 5.7 (H) 3.5 - 5.1 mmol/L   Calcium , Ion 1.01 (L) 1.15 - 1.40 mmol/L   HCT 30.0 (L) 36.0 - 46.0 %   Hemoglobin 10.2 (L) 12.0 - 15.0 g/dL   Sample type VENOUS   CBG monitoring, ED     Status: None   Collection Time: 05/14/24 11:23 PM  Result Value Ref Range   Glucose-Capillary 90 70 - 99 mg/dL  HIV Antibody (routine testing w rflx)     Status: None   Collection Time: 05/15/24 12:10 AM  Result Value Ref Range   HIV Screen 4th Generation wRfx Non Reactive Non Reactive  I-Stat Lactic Acid, ED     Status: Abnormal   Collection Time: 05/15/24 12:16 AM  Result Value Ref Range   Lactic Acid, Venous 3.0 (HH) 0.5 - 1.9 mmol/L   Comment NOTIFIED PHYSICIAN   CBC with Differential/Platelet     Status: Abnormal   Collection Time: 05/15/24  4:29 AM  Result Value Ref Range  WBC 8.8 4.0 - 10.5 K/uL   RBC 2.95 (L) 3.87 - 5.11 MIL/uL   Hemoglobin 7.6 (L) 12.0 - 15.0 g/dL   HCT 76.0 (L) 63.9 - 53.9 %   MCV 81.0 80.0 - 100.0 fL   MCH 25.8 (L) 26.0 - 34.0 pg   MCHC 31.8 30.0 - 36.0 g/dL   RDW 81.9 (H) 88.4 - 84.4 %   Platelets 283 150 - 400 K/uL   nRBC 0.2 0.0 - 0.2 %   Neutrophils Relative % 61 %   Neutro Abs 5.3 1.7 - 7.7 K/uL   Lymphocytes Relative 26 %   Lymphs Abs 2.3 0.7 - 4.0 K/uL   Monocytes Relative 12 %   Monocytes Absolute 1.1 (H) 0.1 - 1.0 K/uL   Eosinophils Relative 0 %   Eosinophils Absolute 0.0 0.0 - 0.5 K/uL   Basophils Relative 0 %   Basophils Absolute 0.0 0.0 - 0.1 K/uL   Immature  Granulocytes 1 %   Abs Immature Granulocytes 0.05 0.00 - 0.07 K/uL  Comprehensive metabolic panel     Status: Abnormal   Collection Time: 05/15/24  4:29 AM  Result Value Ref Range   Sodium 129 (L) 135 - 145 mmol/L   Potassium 4.3 3.5 - 5.1 mmol/L   Chloride 92 (L) 98 - 111 mmol/L   CO2 20 (L) 22 - 32 mmol/L   Glucose, Bld 470 (H) 70 - 99 mg/dL   BUN 46 (H) 8 - 23 mg/dL   Creatinine, Ser 8.69 (H) 0.44 - 1.00 mg/dL   Calcium  7.5 (L) 8.9 - 10.3 mg/dL   Total Protein 6.7 6.5 - 8.1 g/dL   Albumin 2.3 (L) 3.5 - 5.0 g/dL   AST 19 15 - 41 U/L   ALT 20 0 - 44 U/L   Alkaline Phosphatase 56 38 - 126 U/L   Total Bilirubin 0.5 0.0 - 1.2 mg/dL   GFR, Estimated 45 (L) >60 mL/min   Anion gap 17 (H) 5 - 15  Magnesium      Status: None   Collection Time: 05/15/24  4:29 AM  Result Value Ref Range   Magnesium  2.3 1.7 - 2.4 mg/dL  Phosphorus     Status: None   Collection Time: 05/15/24  4:29 AM  Result Value Ref Range   Phosphorus 3.7 2.5 - 4.6 mg/dL  Lactic acid, plasma     Status: Abnormal   Collection Time: 05/15/24  4:29 AM  Result Value Ref Range   Lactic Acid, Venous 2.3 (HH) 0.5 - 1.9 mmol/L  Lactic acid, plasma     Status: Abnormal   Collection Time: 05/15/24  7:20 AM  Result Value Ref Range   Lactic Acid, Venous 2.4 (HH) 0.5 - 1.9 mmol/L  CBG monitoring, ED     Status: Abnormal   Collection Time: 05/15/24  8:17 AM  Result Value Ref Range   Glucose-Capillary 177 (H) 70 - 99 mg/dL  Heparin  level (unfractionated)     Status: Abnormal   Collection Time: 05/15/24 10:00 AM  Result Value Ref Range   Heparin  Unfractionated 0.14 (L) 0.30 - 0.70 IU/mL  APTT     Status: Abnormal   Collection Time: 05/15/24 10:24 AM  Result Value Ref Range   aPTT 82 (H) 24 - 36 seconds   *Note: Due to a large number of results and/or encounters for the requested time period, some results have not been displayed. A complete set of results can be found in Results Review.     Imaging  Orders         DG  Chest Port 1 View         CT Head Wo Contrast         CT CHEST ABDOMEN PELVIS WO CONTRAST         DG Abd Portable 1V-Small Bowel Protocol-Position Verification         DG Abd Portable 1V-Small Bowel Obstruction Protocol-initial, 8 hr delay         DG Abd Portable 1 View      Assessment and Plan   Rhonda Hickman is an 68 y.o. female with possible bowel obstruction.  No indications for emergent surgery based on limited history, exam and CT scan.  Surgery team will follow closely.  Small bowel protocol has been initiated and we will follow the progress since gastrograffin administration.    Deward JINNY Foy, MD  [facility] Use [email or web address] to contact on call provider  New Patient Billing: [phone] - High MDM

## 2024-05-15 NOTE — Progress Notes (Signed)
 PHARMACY - ANTICOAGULATION CONSULT NOTE  Pharmacy Consult for xarelto  >> heparin  Indication: atrial fibrillation  Allergies  Allergen Reactions   Aspirin  Nausea Only and Other (See Comments)    Causes stomach pain    Patient Measurements: Height: 6' (182.9 cm) Weight: 106 kg (233 lb 11 oz) IBW/kg (Calculated) : 73.1 HEPARIN  DW (KG): 95.8  Vital Signs: Temp: 98.4 F (36.9 C) (11/05 0645) Temp Source: Axillary (11/05 0645) BP: 108/71 (11/05 0915) Pulse Rate: 106 (11/05 0915)  Labs: Recent Labs    05/13/24 0756 05/14/24 0326 05/14/24 1940 05/14/24 1953 05/15/24 0429 05/15/24 1024  HGB 9.6*  --  9.6* 10.2* 7.6*  --   HCT 30.4*  --  29.8* 30.0* 23.9*  --   PLT 291  --  334  --  283  --   APTT  --   --   --   --   --  82*  CREATININE 1.52* 1.51* 1.64*  --  1.30*  --     Estimated Creatinine Clearance: 57.2 mL/min (A) (by C-G formula based on SCr of 1.3 mg/dL (H)).   Medical History: Past Medical History:  Diagnosis Date   Anemia    Anxiety    Arthritis    Asthma    Atrial fibrillation (HCC)    Bipolar 1 disorder (HCC)    Bulging lumbar disc    Chronic pain of left knee    COPD (chronic obstructive pulmonary disease) (HCC)    Coronary artery disease    CVA (cerebral vascular accident) (HCC)    Diabetes mellitus    Dyspnea    Dysrhythmia    GERD (gastroesophageal reflux disease)    Neuropathy    TIA (transient ischemic attack)    Vertigo    Assessment: 53 yoF presented with AMS after recent d/c on 11/4 s/p right knee replacement. Pharmacy consulted to dose heparin  until patient able to take xarelto  for afib. Per facility did not receive evening dose of xarelto , last dose documented 11/3 at [facility]  aPTT 82, therapeutic  No issues with infusion or bleeding per RN, Drop in hemoglobin noted repeat labs planned. Will monitor for bleeding.   Goal of Therapy:  Heparin  level 0.3-0.7 units/ml aPTT 66-102 seconds Monitor platelets by anticoagulation protocol: Yes    Plan:  Continue Heparin  1400 units/hr -8h aPTT and heparin  level -Monitor with aPTTs until correlates with heparin  level -CBC daily -Follow up ability to transition back to Xarelto   Thank you for allowing pharmacy to participate in this patient's care.  Leonor GORMAN Bash, PharmD Emergency Medicine Clinical Pharmacist 05/15/2024,11:12 AM

## 2024-05-16 ENCOUNTER — Encounter: Payer: Self-pay | Admitting: *Deleted

## 2024-05-16 ENCOUNTER — Inpatient Hospital Stay (HOSPITAL_COMMUNITY)

## 2024-05-16 ENCOUNTER — Ambulatory Visit (HOSPITAL_BASED_OUTPATIENT_CLINIC_OR_DEPARTMENT_OTHER): Admitting: Cardiology

## 2024-05-16 DIAGNOSIS — R4182 Altered mental status, unspecified: Secondary | ICD-10-CM | POA: Diagnosis not present

## 2024-05-16 DIAGNOSIS — I4892 Unspecified atrial flutter: Secondary | ICD-10-CM | POA: Diagnosis not present

## 2024-05-16 DIAGNOSIS — I4891 Unspecified atrial fibrillation: Secondary | ICD-10-CM

## 2024-05-16 DIAGNOSIS — K56609 Unspecified intestinal obstruction, unspecified as to partial versus complete obstruction: Secondary | ICD-10-CM | POA: Diagnosis not present

## 2024-05-16 LAB — CBC WITH DIFFERENTIAL/PLATELET
Abs Immature Granulocytes: 0.07 K/uL (ref 0.00–0.07)
Basophils Absolute: 0.1 K/uL (ref 0.0–0.1)
Basophils Relative: 1 %
Eosinophils Absolute: 0 K/uL (ref 0.0–0.5)
Eosinophils Relative: 0 %
HCT: 25 % — ABNORMAL LOW (ref 36.0–46.0)
Hemoglobin: 8.1 g/dL — ABNORMAL LOW (ref 12.0–15.0)
Immature Granulocytes: 1 %
Lymphocytes Relative: 18 %
Lymphs Abs: 1.7 K/uL (ref 0.7–4.0)
MCH: 25.7 pg — ABNORMAL LOW (ref 26.0–34.0)
MCHC: 32.4 g/dL (ref 30.0–36.0)
MCV: 79.4 fL — ABNORMAL LOW (ref 80.0–100.0)
Monocytes Absolute: 1.5 K/uL — ABNORMAL HIGH (ref 0.1–1.0)
Monocytes Relative: 16 %
Neutro Abs: 6.2 K/uL (ref 1.7–7.7)
Neutrophils Relative %: 64 %
Platelets: 355 K/uL (ref 150–400)
RBC: 3.15 MIL/uL — ABNORMAL LOW (ref 3.87–5.11)
RDW: 18.3 % — ABNORMAL HIGH (ref 11.5–15.5)
Smear Review: NORMAL
WBC: 9.5 K/uL (ref 4.0–10.5)
nRBC: 0.4 % — ABNORMAL HIGH (ref 0.0–0.2)

## 2024-05-16 LAB — BASIC METABOLIC PANEL WITH GFR
Anion gap: 15 (ref 5–15)
BUN: 36 mg/dL — ABNORMAL HIGH (ref 8–23)
CO2: 19 mmol/L — ABNORMAL LOW (ref 22–32)
Calcium: 8.3 mg/dL — ABNORMAL LOW (ref 8.9–10.3)
Chloride: 103 mmol/L (ref 98–111)
Creatinine, Ser: 1.26 mg/dL — ABNORMAL HIGH (ref 0.44–1.00)
GFR, Estimated: 47 mL/min — ABNORMAL LOW (ref >60)
Glucose, Bld: 219 mg/dL — ABNORMAL HIGH (ref 70–99)
Potassium: 3.6 mmol/L (ref 3.5–5.1)
Sodium: 137 mmol/L (ref 135–145)

## 2024-05-16 LAB — GLUCOSE, CAPILLARY
Glucose-Capillary: 150 mg/dL — ABNORMAL HIGH (ref 70–99)
Glucose-Capillary: 156 mg/dL — ABNORMAL HIGH (ref 70–99)
Glucose-Capillary: 161 mg/dL — ABNORMAL HIGH (ref 70–99)
Glucose-Capillary: 164 mg/dL — ABNORMAL HIGH (ref 70–99)
Glucose-Capillary: 187 mg/dL — ABNORMAL HIGH (ref 70–99)
Glucose-Capillary: 221 mg/dL — ABNORMAL HIGH (ref 70–99)

## 2024-05-16 LAB — APTT
aPTT: 62 s — ABNORMAL HIGH (ref 24–36)
aPTT: 51 s — ABNORMAL HIGH (ref 24–36)

## 2024-05-16 LAB — HEPARIN LEVEL (UNFRACTIONATED)
Heparin Unfractionated: 0.47 [IU]/mL (ref 0.30–0.70)
Heparin Unfractionated: 0.32 [IU]/mL (ref 0.30–0.70)

## 2024-05-16 LAB — LACTIC ACID, PLASMA: Lactic Acid, Venous: 1.5 mmol/L (ref 0.5–1.9)

## 2024-05-16 MED ORDER — ONDANSETRON HCL 4 MG/2ML IJ SOLN: 4.0000 mg | Freq: Four times a day (QID) | INTRAMUSCULAR | Status: DC | PRN

## 2024-05-16 MED ORDER — SODIUM CHLORIDE 0.9 % IV SOLN: INTRAVENOUS | Status: AC

## 2024-05-16 MED ORDER — IPRATROPIUM-ALBUTEROL 0.5-2.5 (3) MG/3ML IN SOLN: 3.0000 mL | Freq: Three times a day (TID) | RESPIRATORY_TRACT | Status: DC

## 2024-05-16 MED ORDER — HYDROMORPHONE HCL 1 MG/ML IJ SOLN
0.2500 mg | INTRAMUSCULAR | Status: AC | PRN
  Administered 2024-05-17 (×3): 0.25 mg via INTRAVENOUS
  Filled 2024-05-16 (×3): qty 0.5

## 2024-05-16 MED ORDER — IPRATROPIUM-ALBUTEROL 0.5-2.5 (3) MG/3ML IN SOLN: 3.0000 mL | RESPIRATORY_TRACT | Status: DC | PRN

## 2024-05-16 NOTE — Plan of Care (Signed)
  Problem: Education: Goal: Knowledge of General Education information will improve Description: Including pain rating scale, medication(s)/side effects and non-pharmacologic comfort measures Outcome: Progressing   Problem: Safety: Goal: Non-violent Restraint(s) Outcome: Progressing   Problem: Skin Integrity: Goal: Risk for impaired skin integrity will decrease Outcome: Progressing   Problem: Nutritional: Goal: Maintenance of adequate nutrition will improve Outcome: Progressing

## 2024-05-16 NOTE — Progress Notes (Signed)
 Patient ID: Rhonda Hickman, female   DOB: 13-Oct-1955, 68 y.o.   MRN: [id number] I only just found out this morning that [name] had been readmitted.  I came by the bedside to check on her.  I did change her right knee dressing and her incision remains clean and dry and I did place a new dressing over the right total knee.  She actually recognize me today and was the most lucent that I have seen her since Sunday.  Saturday, she looked well and then we did obtain a CT scan of her head due to somnolence which was normal and she showed no focal deficits.  She seem to be improving so we did discharge her to skilled nursing.  I did review her chart to see what has been transpiring medically and certainly appreciate the care by the hospitalist as well as the consultation services that have seen her.  I will continue to check in on her and will make recommendations from her orthopedic standpoint in terms of PT and OT.  She can be up from my standpoint and weightbearing as tolerated on her right knee as she improves medically.

## 2024-05-16 NOTE — Inpatient Diabetes Management (Signed)
 Inpatient Diabetes Program Recommendations  AACE/ADA: New Consensus Statement on Inpatient Glycemic Control (2015)  Target Ranges:  Prepandial:   less than 140 mg/dL      Peak postprandial:   less than 180 mg/dL (1-2 hours)      Critically ill patients:  140 - 180 mg/dL   Lab Results  Component Value Date   GLUCAP 187 (H) 05/16/2024   HGBA1C 7.5 (H) 05/06/2024    Review of Glycemic Control  Latest Reference Range & Units 05/15/24 16:09 05/15/24 19:53 05/15/24 23:17 05/16/24 03:56 05/16/24 07:58  Glucose-Capillary 70 - 99 mg/dL 846 (H) 819 (H) 824 (H) 221 (H) 187 (H)  (H): Data is abnormally high Diabetes history: Type 2 DM Outpatient Diabetes medications: Lantus  20 units BID, Metformin  1000 mg BID, Ozempic  0.5 mg qwk Current orders for Inpatient glycemic control: Novolog  0-9 units Q4H  Inpatient Diabetes Program Recommendations:    Consider adding Semglee  8 units BID  Thanks, Tinnie Minus, MSN, RNC-OB Diabetes Coordinator [phone] (8a-5p)

## 2024-05-16 NOTE — Progress Notes (Addendum)
 Patient seen and examined, note reviewed with the signed Advanced Practice Provider. I personally reviewed laboratory data, imaging studies and relevant notes. I independently examined the patient and formulated the important aspects of the plan. I have personally discussed the plan with the patient and/or family. Comments or changes to the note/plan are indicated below.  She is sleeping comfortably on interview, does awaken to voice but drifts back off to sleep. Remains in atrial flutter, largely 2:1 in the 130s. There is slightly more blood pressure room today, though has been variable. NG was removed by patient overnight. Remains NPO.   Exam shows no acute distress. No JVD. Tachycardic, largely regular S1/S2, no murmurs appreciated. Lungs slightly diminished at bases but doesn't give deep inspiration. Abdomen soft, she does grimace slightly with palpation. Extremities warm, no significant edema.  Atrial flutter with rapid ventricular response Paroxysmal atrial fibrillation - she is NPO, continue with IV heparin  and IV amiodarone at this time - Suspect that her heart rate will overall be difficult to control while she is acutely ill, and flutter is typically more resistant to rate control than atrial fibrillation.  As long as she remains hemodynamically stable, would continue with medical management with IV amiodarone - her blood pressure is slowly rising. We may be able to add low dose IV diltiazem  if this continues to rise, but need to be cautious given risk of hypotension -her renal function is improving, but her mental status waxes and wanes based on chart. If her mental status returns to her normal and her kidneys continue to improve, can use IV digoxin for rate control - She is currently n.p.o. but could work to add fractionated metoprolol  once able to take oral medication. IV metoprolol  is very short acting and unlikely to give any sustained benefit - ultimately she may require  TEE-cardioversion to manage the flutter, but this would have to be significantly far in the future given her SBO/nausea   Comorbid conditions currently include acute metabolic encephalopathy, small bowel obstruction, abnormal electrolytes, acute kidney injury, lactic acidosis  Shelda Bruckner, MD, PhD, [facility]  [facility]  [place]  [facility] at [facility] [address] [place], [place] [address] [phone]      Progress Note  Patient Name: Rhonda Hickman Date of Encounter: 05/16/2024 [facility] Cardiologist: Bruckner LITTIE [name], MD   Interval Summary   Continues to be minimally interactive when asked questions.  Opens eyes when aroused.   Vital Signs Vitals:   05/15/24 2317 05/16/24 0355 05/16/24 0755 05/16/24 0808  BP: 114/73 127/84 (!) 114/91   Pulse:   (!) 105   Resp:   17   Temp: 98.3 F (36.8 C) 97.8 F (36.6 C) 97.9 F (36.6 C)   TempSrc: Axillary Oral Oral   SpO2:   96% 91%  Weight:      Height:        Intake/Output Summary (Last 24 hours) at 05/16/2024 1023 Last data filed at 05/15/2024 1615 Gross per 24 hour  Intake 625.66 ml  Output --  Net 625.66 ml      05/14/2024    7:21 PM 05/10/2024    7:09 AM 05/06/2024    1:21 PM  Last 3 Weights  Weight (lbs) 233 lb 11 oz 233 lb 11 oz 233 lb 11 oz  Weight (kg) 106 kg 106 kg 106 kg      Telemetry/ECG  Atrial fibrillation with heart rates in the 120's-  Personally Reviewed  Physical Exam  GEN: No acute distress.  Remains unable to answer questions. Neck: No JVD Cardiac: Irregularly irregular rhythm, rapid heart rates, no murmurs, rubs, or gallops.  Respiratory: Clear to auscultation bilaterally. GI: Soft, nontender, non-distended  MS: No edema  Assessment & Plan  Clytee Heinrich is a 68 y.o. female with a hx of TIA, paroxysmal atrial fibrillation, BPD, COPD, T2DM, asthma, hyperlipidemia, tobacco use, mild OSA no  indication for CPAP, hypertension  who is being seen 05/15/2024 for the evaluation of A-Fib with RVR   Paroxysmal atrial fibrillation with RVR CHA2DS2-VASc Score = 7 [CHF History: 0, HTN History: 1, Diabetes History: 1, Stroke History: 2, Vascular Disease History: 1, Age Score: 1, Gender Score: 1].  Therefore, the patient's annual risk of stroke is 11.2 %.    Has a past medical history of atrial fibrillation. PTA was on Lopressor  50 mg twice daily, Xarelto  20 mg daily.  Surgery saw the patient this morning and recommended that she remain NPO.  This limits options for rate control.  Heart rates on telemetry continue to remain slightly elevated in the 120s.  Blood pressure is stable. Today at about 8 AM was 114/91.  Potassium 3.6, magnesium  2.3. Suspect these increased heart rates are secondary to SBO and a UTI. Order TSH Continue IV amiodarone, IV heparin . May consider starting IV Cardizem  drip.   Altered mental status UTI UA had positive nitrites and leukocytes indicative of UTI. On IV antibiotics per primary.   SBO Surgery saw her today, got abdominal x-ray, and recommended that the patient remain n.p.o.   HTH Hold home amlodipine  and metoprolol .  Will prioritize rate control.   CAD HLD Coronary CTA on 10/2022 found Calcium  score 54.5 which is 80 th percentile for age/sex. Plan to restart Crestor  10mg  daily after SBO resolves.   Otherwise management per primary    For questions or updates, please contact [facility] Please consult [email or web address] for contact info under        Signed, Zane Adams, PA-C

## 2024-05-16 NOTE — Progress Notes (Signed)
 PHARMACY - ANTICOAGULATION CONSULT NOTE  Pharmacy Consult for xarelto  >> heparin  Indication: atrial fibrillation  Allergies  Allergen Reactions   Aspirin  Nausea Only and Other (See Comments)    Causes stomach pain    Patient Measurements: Height: 6' (182.9 cm) Weight: 106 kg (233 lb 11 oz) IBW/kg (Calculated) : 73.1 HEPARIN  DW (KG): 95.8  Vital Signs: Temp: 97.9 F (36.6 C) (11/06 0755) Temp Source: Oral (11/06 0755) BP: 114/91 (11/06 0755) Pulse Rate: 105 (11/06 0755)  Labs: Recent Labs    05/15/24 0429 05/15/24 1000 05/15/24 1024 05/15/24 1215 05/16/24 0334  HGB 7.6*  --   --  8.2* 8.1*  HCT 23.9*  --   --  25.1* 25.0*  PLT 283  --   --  337 355  APTT  --   --  82*  --  62*  HEPARINUNFRC  --  0.14*  --   --  0.47  CREATININE 1.30*  --   --  1.37* 1.26*    Estimated Creatinine Clearance: 59 mL/min (A) (by C-G formula based on SCr of 1.26 mg/dL (H)).   Medical History: Past Medical History:  Diagnosis Date   Anemia    Anxiety    Arthritis    Asthma    Atrial fibrillation (HCC)    Bipolar 1 disorder (HCC)    Bulging lumbar disc    Chronic pain of left knee    COPD (chronic obstructive pulmonary disease) (HCC)    Coronary artery disease    CVA (cerebral vascular accident) (HCC)    Diabetes mellitus    Dyspnea    Dysrhythmia    GERD (gastroesophageal reflux disease)    Neuropathy    TIA (transient ischemic attack)    Vertigo    Assessment: 68 yo F presented with AMS after recent d/c on 11/4 s/p right knee replacement. Pharmacy consulted to dose heparin  until patient able to take xarelto  for afib. Per facility did not receive evening dose of xarelto , last dose documented 11/3 at [facility]  aPTT 62, Heparin  level 0.47 -- unclear if labs correlating.  Previous heparin  level was low implying xarelto  clearance, however, heparin  level now therapeutic without rate change.  aPTT now subtherapeutic on 1400 units/hr.  No issues with infusion or bleeding per RN, Drop  in hemoglobin noted.  Now stable in 8s.Will monitor for bleeding.   Goal of Therapy:  Heparin  level 0.3-0.7 units/ml aPTT 66-102 seconds Monitor platelets by anticoagulation protocol: Yes   Plan:  Increase Heparin  1500 units/hr -Repeat 8h aPTT and heparin  level to check for correlation -CBC daily -Follow up ability to transition back to Xarelto   Thank you for allowing pharmacy to participate in this patient's care.  [employer], Pharm.D., BCPS Clinical Pharmacist Clinical phone for 05/16/2024 from 7:30-3:00 is [phone].  **Pharmacist phone directory can be found on [email or web address] listed under [facility] Pharm[facility].  05/16/2024 9:00 AM

## 2024-05-16 NOTE — Progress Notes (Signed)
 Subjective: Patient very lethargic/obtunded this am.  She will open her eyes and stares at me, occasionally answer a yes or no question, but otherwise looks at me and then just closes her eyes and doesn't respond.  Not much information obtained this am because of this.  Apparently pulled NGT out overnight.  Objective: Vital signs in last 24 hours: Temp:  [97.7 F (36.5 C)-99.6 F (37.6 C)] 97.9 F (36.6 C) (11/06 0755) Pulse Rate:  [67-143] 105 (11/06 0755) Resp:  [17-35] 17 (11/06 0755) BP: (108-152)/(71-108) 114/91 (11/06 0755) SpO2:  [91 %-100 %] 91 % (11/06 0808) Last BM Date :  (PTA)  Intake/Output from previous day: 11/05 0701 - 11/06 0700 In: 1092.2 [I.V.:1092.2] Out: -  Intake/Output this shift: No intake/output data recorded.  PE: Gen: lethargic Heart: irregular Lungs: respiratory effort nonlabored Abd: soft, mild distention, doesn't seem tender, but she doesn't wake up really  Lab Results:  Recent Labs    05/15/24 1215 05/16/24 0334  WBC 11.1* 9.5  HGB 8.2* 8.1*  HCT 25.1* 25.0*  PLT 337 355   BMET Recent Labs    05/15/24 1215 05/16/24 0334  NA 131* 137  K 4.3 3.6  CL 98 103  CO2 18* 19*  GLUCOSE 251* 219*  BUN 46* 36*  CREATININE 1.37* 1.26*  CALCIUM  7.9* 8.3*   PT/INR No results for input(s): LABPROT, INR in the last 72 hours. CMP     Component Value Date/Time   NA 137 05/16/2024 0334   NA 139 04/19/2023 0958   K 3.6 05/16/2024 0334   CL 103 05/16/2024 0334   CO2 19 (L) 05/16/2024 0334   GLUCOSE 219 (H) 05/16/2024 0334   BUN 36 (H) 05/16/2024 0334   BUN 10 04/19/2023 0958   CREATININE 1.26 (H) 05/16/2024 0334   CALCIUM  8.3 (L) 05/16/2024 0334   PROT 6.7 05/15/2024 0429   PROT 7.5 04/19/2023 0958   ALBUMIN 2.3 (L) 05/15/2024 0429   ALBUMIN 4.2 04/19/2023 0958   AST 19 05/15/2024 0429   ALT 20 05/15/2024 0429   ALKPHOS 56 05/15/2024 0429   BILITOT 0.5 05/15/2024 0429   BILITOT <0.2 04/19/2023 0958   GFRNONAA 47 (L)  05/16/2024 0334   GFRAA >60 03/17/2020 2000   Lipase  No results found for: LIPASE     Studies/Results: DG Abd Portable 1V Result Date: 05/16/2024 EXAM: 1 VIEW XRAY OF THE ABDOMEN 05/16/2024 08:21:00 AM COMPARISON: 05/15/2024 CLINICAL HISTORY: SBO (small bowel obstruction) (HCC) FINDINGS: BOWEL: Dilute contrast material opacifies the small bowel loops. Diminished small bowel gas limits assessment for bowel obstruction. However, there are continued mildly dilated loops of small bowel in the right lower quadrant of the abdomen measuring up to 4.1 cm. Gas and stool noted within the colon up to the rectum. SOFT TISSUES: Cholecystectomy clips noted. No opaque urinary calculi. BONES: No acute osseous abnormality. IMPRESSION: 1. Continued mildly dilated small-bowel loops in the right lower quadrant measuring up to 4.1 cm, which may reflect ongoing ileus or partial small-bowel obstruction. Electronically signed by: Waddell Calk MD 05/16/2024 08:32 AM EST RP Workstation: [id number]   DG Abd Portable 1V-Small Bowel Obstruction Protocol-initial, 8 hr delay Result Date: 05/15/2024 CLINICAL DATA:  Small-bowel obstruction EXAM: PORTABLE ABDOMEN - 1 VIEW COMPARISON:  05/15/2024 FINDINGS: Three supine frontal views of the abdomen and pelvis are obtained. Enteric catheter has been removed. The previously administered oral contrast remains within the stomach. Slow diffusion of oral contrast into the proximal small bowel.  No contrast has reached the colon by the time of imaging. Continued distended gas-filled loops of small bowel are seen within the right hemiabdomen. IMPRESSION: 1. Continued findings of small-bowel obstruction. The previously administered oral contrast has not yet reached the colon by the time of imaging. 2. Interval removal of the enteric catheter. Electronically Signed   By: Ozell Daring M.D.   On: 05/15/2024 14:56   DG Abd Portable 1 View Result Date: 05/15/2024 CLINICAL DATA:  NG tube  placement EXAM: PORTABLE ABDOMEN - 1 VIEW COMPARISON:  05/15/2024 FINDINGS: Similarly positioned esophagogastric tube, with the tip obscured by enteric contrast in the stomach. The last side hole remains at the level of the GE junction. Similar gaseous distention of the small bowel in the upper abdomen.No pneumoperitoneum. Cholecystectomy clips. No acute fracture or destructive lesion. IMPRESSION: 1. Similarly positioned esophagogastric tube, with the tip obscured by enteric contrast in the stomach. 2. Redemonstrated gas-filled and dilated small bowel in the upper abdomen. Electronically Signed   By: Rogelia Myers M.D.   On: 05/15/2024 09:02   DG Abd Portable 1V-Small Bowel Protocol-Position Verification Result Date: 05/15/2024 EXAM: 1 VIEW XRAY OF THE ABDOMEN 05/15/2024 03:54:00 AM COMPARISON: CT abdomen and pelvis 08/06/2023. CLINICAL HISTORY: Encounter for imaging study to confirm nasogastric (NG) tube placement. FINDINGS: LINES, TUBES AND DEVICES: Enteric tube in place with tip overlying the expected region of the gastric lumen and side port overlying the expected region of the gastroesophageal junction. BOWEL: Several loops of mildly dilated small bowel within the abdomen. SOFT TISSUES: Right upper quadrant surgical clips noted. No opaque urinary calculi. BONES: No acute osseous abnormality. IMPRESSION: 1. Enteric tube side port at the gastroesophageal junction, recommend advancement of 5 cm to ensure side hole placement within the stomach 2. Stable bowel gas pattern from earlier CT. Electronically signed by: Helayne Hurst MD 05/15/2024 04:26 AM EST RP Workstation: [id number]   CT CHEST ABDOMEN PELVIS WO CONTRAST Result Date: 05/15/2024 EXAM: CT CHEST, ABDOMEN AND PELVIS WITHOUT CONTRAST 05/15/2024 01:35:12 AM TECHNIQUE: CT of the chest, abdomen and pelvis was performed without the administration of intravenous contrast. Multiplanar reformatted images are provided for review. Automated exposure control,  iterative reconstruction, and/or weight based adjustment of the mA/kV was utilized to reduce the radiation dose to as low as reasonably achievable. COMPARISON: CT heart 11/03/2022, CT abdomen and pelvis 05/22/2021. CLINICAL HISTORY: Sepsis. FINDINGS: CHEST: MEDIASTINUM AND LYMPH NODES: Scattered calcifications in the left anterior descending coronary artery. Pericardium is unremarkable. The central airways are clear. No mediastinal, hilar or axillary lymphadenopathy. LUNGS AND PLEURA: Lower lobe atelectasis bilaterally. No focal consolidation or pulmonary edema. No pleural effusion or pneumothorax. ABDOMEN AND PELVIS: LIVER: The liver is unremarkable. GALLBLADDER AND BILE DUCTS: Prior cholecystectomy. No biliary ductal dilatation. SPLEEN: No acute abnormality. PANCREAS: No acute abnormality. ADRENAL GLANDS: No acute abnormality. KIDNEYS, URETERS AND BLADDER: No stones in the kidneys or ureters. No hydronephrosis. No perinephric or periureteral stranding. Air within the urinary bladder, presumably from recent catheterization. GI AND BOWEL: Stomach demonstrates no acute abnormality. Mildly dilated fluid-filled small bowel loops into the pelvis. Distal small bowel is decompressed. Cannot exclude distal small bowel obstruction. Moderate stool burden throughout the colon. Normal appendix. REPRODUCTIVE ORGANS: No acute abnormality. PERITONEUM AND RETROPERITONEUM: Small amount of free fluid in the cul-de-sac. No free air. VASCULATURE: Aortic atherosclerosis. Aorta is normal in caliber. ABDOMINAL AND PELVIS LYMPH NODES: No lymphadenopathy. BONES AND SOFT TISSUES: No acute osseous abnormality. No focal soft tissue abnormality. IMPRESSION: 1. Findings suspicious for  distal small bowel obstruction, given mildly dilated fluid-filled small bowel loops with decompressed distal small bowel. 2. Moderate colonic stool burden. 3. Coronary artery disease, aortic atherosclerosis Electronically signed by: Franky Crease MD 05/15/2024 01:39  AM EST RP Workstation: [id number]   CT Head Wo Contrast Result Date: 05/14/2024 EXAM: CT HEAD WITHOUT CONTRAST 05/14/2024 08:02:51 PM TECHNIQUE: CT of the head was performed without the administration of intravenous contrast. Automated exposure control, iterative reconstruction, and/or weight based adjustment of the mA/kV was utilized to reduce the radiation dose to as low as reasonably achievable. COMPARISON: 05/13/2024. CLINICAL HISTORY: Delirium. FINDINGS: BRAIN AND VENTRICLES: No acute hemorrhage. No evidence of acute infarct. No hydrocephalus. No extra-axial collection. No mass effect or midline shift. There is atrophy and chronic small vessel disease throughout the deep white matter. ORBITS: No acute abnormality. SINUSES: No acute abnormality. SOFT TISSUES AND SKULL: No acute soft tissue abnormality. No skull fracture. IMPRESSION: 1. No acute intracranial abnormality. 2. Atrophy and chronic small vessel disease throughout the deep white matter. Electronically signed by: Franky Crease MD 05/14/2024 08:08 PM EST RP Workstation: [id number]   DG Chest Port 1 View Result Date: 05/14/2024 EXAM: 1 VIEW(S) XRAY OF THE CHEST 05/14/2024 07:55:00 PM COMPARISON: 10/02/2023 CLINICAL HISTORY: Questionable sepsis - evaluate for abnormality FINDINGS: LUNGS AND PLEURA: Low lung volumes. Right basilar atelectasis. No pulmonary edema. No pleural effusion. No pneumothorax. HEART AND MEDIASTINUM: No acute abnormality of the cardiac and mediastinal silhouettes. BONES AND SOFT TISSUES: No acute osseous abnormality. IMPRESSION: 1. Low lung volumes with right basilar atelectasis. Electronically signed by: Franky Crease MD 05/14/2024 08:05 PM EST RP Workstation: [id number]    Anti-infectives: Anti-infectives (From admission, onward)    Start     Dose/Rate Route Frequency Ordered Stop   05/15/24 0600  piperacillin-tazobactam (ZOSYN) IVPB 3.375 g        3.375 g 12.5 mL/hr over 240 Minutes Intravenous Every 8 hours 05/15/24  0031     05/14/24 2145  cefTRIAXone  (ROCEPHIN ) 2 g in sodium chloride  0.9 % 100 mL IVPB        2 g 200 mL/hr over 30 Minutes Intravenous  Once 05/14/24 2131 05/14/24 2357   05/14/24 2145  azithromycin  (ZITHROMAX ) 500 mg in sodium chloride  0.9 % 250 mL IVPB        500 mg 250 mL/hr over 60 Minutes Intravenous  Once 05/14/24 2131 05/15/24 0116        Assessment/Plan SBO -repeat film reviewed this morning.  Still with some dilated SB, but there is some air and stool in her colon and possibly even some contrast on my review -patient unable to provide any significant history today and given over 2L from NGT yesterday and x-ray this am still with dilated SB, will leave NPO.  If she vomits, she will need her NGT replaced -d/w primary service on ward -continue to monitor   FEN - NPO, replace NGT if vomits, IVFs per primary VTE - heparin  ID - zosyn for HCAP  HCAP A fib with RVR Bipolar disorder COPD CVA CAD DM GERD  I reviewed Consultant cardiology notes, hospitalist notes, last 24 h vitals and pain scores, last 48 h intake and output, last 24 h labs and trends, and last 24 h imaging results.   LOS: 2 days    Burnard FORBES Banter , [facility] 05/16/2024, 9:10 AM Please see Amion for pager number during day hours 7:00am-4:30pm or 7:00am -11:30am on weekends

## 2024-05-16 NOTE — TOC Initial Note (Signed)
 Transition of Care [facility]) - Initial/Assessment Note    Patient Details  Name: Rhonda Hickman MRN: [id number] Date of Birth: March 22, 1956  [facility] CM/SW Contact:    [name], LCSW Phone Number: 05/16/2024, 1:43 PM  Clinical Narrative:                 Patient admitted from [facility] where she had only been a few hours prior to returning to the hospital. CSW left voicemail for patient's sister, Dickey. Patient will require insurance approval to return to SNF.   Expected Discharge Plan: Skilled Nursing Facility Barriers to Discharge: Continued Medical Work up, English As A Second Language Teacher   Patient Goals and CMS Choice Patient states their goals for this hospitalization and ongoing recovery are:: Return to rehab CMS Medicare.gov Compare Post Acute Care list provided to:: Patient Represenative (must comment) Choice offered to / list presented to : Sibling [place] ownership interest in [facility].provided to:: Sibling    Expected Discharge Plan and Services In-house Referral: Clinical Social Work   Post Acute Care Choice: Skilled Nursing Facility Living arrangements for the past 2 months: Apartment                                      Prior Living Arrangements/Services Living arrangements for the past 2 months: Apartment Lives with:: Self Patient language and need for interpreter reviewed:: Yes Do you feel safe going back to the place where you live?: Yes      Need for Family Participation in Patient Care: Yes (Comment) Care giver support system in place?: Yes (comment)   Criminal Activity/Legal Involvement Pertinent to Current Situation/Hospitalization: No - Comment as needed  Activities of Daily Living      Permission Sought/Granted Permission sought to share information with : Facility Medical Sales Representative, Family Supports Permission granted to share information with : Yes, Verbal Permission Granted  Share Information with  NAME: Dorothey [place] [phone]  Permission granted to share info w AGENCY: [facility]        Emotional Assessment Appearance:: Appears stated age Attitude/Demeanor/Rapport: Unable to Assess Affect (typically observed): Unable to Assess Orientation: : Oriented to Self, Oriented to Place Alcohol / Substance Use: Not Applicable Psych Involvement: No (comment)  Admission diagnosis:  Small bowel obstruction (HCC) [K56.609] Disorientation [R41.0] Pneumonia of left lower lobe due to infectious organism [J18.9] AMS (altered mental status) [R41.82] Patient Active Problem List   Diagnosis Date Noted   Status post right knee replacement 05/14/2024   AMS (altered mental status) 05/14/2024   Status post total right knee replacement 05/10/2024   OA (osteoarthritis) of knee 06/20/2023   Status post total left knee replacement 06/20/2023   Lumbar radiculopathy 02/01/2023   S/P left knee arthroscopy11/29/22 07/13/2021   Tear of meniscus of knee joint    Acute lower UTI 05/23/2021   A-fib (HCC) 05/22/2021   Leukocytosis 05/22/2021   Fall at home, initial encounter 05/22/2021    Class: Acute   HLD (hyperlipidemia) 05/22/2021   Hypertension 03/18/2020   Bipolar 1 disorder (HCC)    Chronic atrial fibrillation (HCC) 12/03/2019   TIA (transient ischemic attack) 12/03/2019   Diabetic peripheral neuropathy (HCC) 12/03/2019   Chronic migraine 12/03/2019   Vertigo 08/03/2019   Bronchiectasis with acute exacerbation (HCC)    Obesity, Class III, BMI 40-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) (HCC)    Gastroesophageal reflux disease    Asthma exacerbation 06/06/2018  Gait abnormality 08/29/2017   Paresthesia 08/29/2017   Derangement of posterior horn of medial meniscus of right knee    Meniscus, lateral, derangement, right    Unilateral primary osteoarthritis, right knee    COPD with acute exacerbation (HCC) 03/09/2016   Type 2 diabetes mellitus (HCC) 02/16/2007   Cocaine abuse  (HCC) 02/16/2007   Extrinsic asthma 02/16/2007   HOMELESSNESS, HX OF 02/16/2007   PCP:  Cicero Aureliano SAUNDERS, MD Pharmacy:   [facility] [place], [place] - [address] [place] [address] Phone: [phone] Fax: [phone]  [facility] Delivery - [place], [place] - [address] [place] [address] Phone: [phone] Fax: [phone]     Social Drivers of Health (SDOH) Social History: SDOH Screenings   Food Insecurity: No Food Insecurity (05/10/2024)  Housing: Low Risk  (05/10/2024)  Transportation Needs: No Transportation Needs (05/10/2024)  Utilities: Not At Risk (05/10/2024)  Depression (PHQ2-9): Low Risk  (03/29/2024)  Social Connections: Patient Unable To Answer (05/10/2024)  Tobacco Use: High Risk (05/10/2024)   SDOH Interventions:     Readmission Risk Interventions     No data to display

## 2024-05-16 NOTE — Progress Notes (Signed)
 PHARMACY - ANTICOAGULATION CONSULT NOTE  Pharmacy Consult for xarelto  >> heparin  Indication: atrial fibrillation  Allergies  Allergen Reactions   Aspirin  Nausea Only and Other (See Comments)    Causes stomach pain    Patient Measurements: Height: 6' (182.9 cm) Weight: 106 kg (233 lb 11 oz) IBW/kg (Calculated) : 73.1 HEPARIN  DW (KG): 95.8  Vital Signs: Temp: 98.5 F (36.9 C) (11/06 1114) Temp Source: Oral (11/06 1114) BP: 103/73 (11/06 1600) Pulse Rate: 132 (11/06 1600)  Labs: Recent Labs    05/15/24 0429 05/15/24 1000 05/15/24 1024 05/15/24 1215 05/16/24 0334 05/16/24 2013  HGB 7.6*  --   --  8.2* 8.1*  --   HCT 23.9*  --   --  25.1* 25.0*  --   PLT 283  --   --  337 355  --   APTT  --   --  82*  --  62* 51*  HEPARINUNFRC  --  0.14*  --   --  0.47 0.32  CREATININE 1.30*  --   --  1.37* 1.26*  --     Estimated Creatinine Clearance: 59 mL/min (A) (by C-G formula based on SCr of 1.26 mg/dL (H)).   Assessment: 68 yo F presented with AMS after recent d/c on 11/4 s/p right knee replacement. Pharmacy consulted to dose heparin  until patient able to take xarelto  for afib. Per facility did not receive evening dose of xarelto , last dose documented 11/3 at [facility]  aPTT low at 51 sec and heparin  level therapeutic at 0.32 - not yet correlating.  No issues with infusion or bleeding per RN.  Goal of Therapy:  Heparin  level 0.3-0.7 units/ml aPTT 66-102 seconds Monitor platelets by anticoagulation protocol: Yes   Plan:  Increase heparin  gtt to 1750 units/hr F/u AM labs  Makalya Nave D. [place], PharmD, BCPS, BCCCP 05/16/2024, 10:01 PM

## 2024-05-16 NOTE — Progress Notes (Signed)
 Rhonda Hickman                                          MRN: [id number]   05/16/2024   The [facility]BCI Quality Team Specialist reviewed this patient medical record for the purposes of chart review for care gap closure. The following were reviewed: chart review for care gap closure-breast cancer screening and colorectal cancer screening.    VBCI Quality Team

## 2024-05-16 NOTE — [place] FL2 (Signed)
 [place]  MEDICAID FL2 LEVEL OF CARE FORM     IDENTIFICATION  Patient Name: Rhonda Hickman Birthdate: Jan 16, 1956 Sex: female Admission Date (Current Location): 05/14/2024  [facility] and [place] Number:  [name] and Address:  [facility] [place], [address] [place], [place] [address]      Provider Number: [id number]  Attending Physician Name and Address:  Raenelle Donalda HERO, MD  Relative Name and Phone Number:  Freddi Shields (Sister) [phone] [facility])    Current Level of Care: Hospital Recommended Level of Care: Skilled Nursing Facility Prior Approval Number:    Date Approved/Denied:   PASRR Number: [id number]  Discharge Plan: SNF    Current Diagnoses: Patient Active Problem List   Diagnosis Date Noted   Status post right knee replacement 05/14/2024   AMS (altered mental status) 05/14/2024   Status post total right knee replacement 05/10/2024   OA (osteoarthritis) of knee 06/20/2023   Status post total left knee replacement 06/20/2023   Lumbar radiculopathy 02/01/2023   S/P left knee arthroscopy11/29/22 07/13/2021   Tear of meniscus of knee joint    Acute lower UTI 05/23/2021   A-fib (HCC) 05/22/2021   Leukocytosis 05/22/2021   Fall at home, initial encounter 05/22/2021   HLD (hyperlipidemia) 05/22/2021   Hypertension 03/18/2020   Bipolar 1 disorder (HCC)    Chronic atrial fibrillation (HCC) 12/03/2019   TIA (transient ischemic attack) 12/03/2019   Diabetic peripheral neuropathy (HCC) 12/03/2019   Chronic migraine 12/03/2019   Vertigo 08/03/2019   Bronchiectasis with acute exacerbation (HCC)    Obesity, Class III, BMI 40-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) (HCC)    Gastroesophageal reflux disease    Asthma exacerbation 06/06/2018   Gait abnormality 08/29/2017   Paresthesia 08/29/2017   Derangement of posterior horn of medial meniscus of right knee    Meniscus, lateral, derangement, right    Unilateral primary osteoarthritis, right  knee    COPD with acute exacerbation (HCC) 03/09/2016   Type 2 diabetes mellitus (HCC) 02/16/2007   Cocaine abuse (HCC) 02/16/2007   Extrinsic asthma 02/16/2007   HOMELESSNESS, HX OF 02/16/2007    Orientation RESPIRATION BLADDER Height & Weight     Self, Place  Normal Incontinent Weight: 233 lb 11 oz (106 kg) Height:  6' (182.9 cm)  BEHAVIORAL SYMPTOMS/MOOD NEUROLOGICAL BOWEL NUTRITION STATUS      Continent Diet (see dc summary)  AMBULATORY STATUS COMMUNICATION OF NEEDS Skin   Extensive Assist Verbally Surgical wounds                       Personal Care Assistance Level of Assistance  Bathing, Feeding, Dressing Bathing Assistance: Maximum assistance Feeding assistance: Maximum assistance Dressing Assistance: Maximum assistance     Functional Limitations Info    Sight Info: Impaired Hearing Info: Adequate Speech Info: Adequate    SPECIAL CARE FACTORS FREQUENCY  PT (By licensed PT), OT (By licensed OT)     PT Frequency: 5x/week OT Frequency: 5x/week            Contractures Contractures Info: Not present    Additional Factors Info  Code Status, Allergies, Insulin  Sliding Scale       Insulin  Sliding Scale Info: see dc summary       Current Medications (05/16/2024):  This is the current hospital active medication list Current Facility-Administered Medications  Medication Dose Route Frequency Provider Last Rate Last Admin   acetaminophen  (TYLENOL ) tablet 500 mg  500 mg Oral Q6H PRN Shona Terry SAILOR, DO  amiodarone (NEXTERONE PREMIX) 360-4.14 MG/200ML-% (1.8 mg/mL) IV infusion  60 mg/hr Intravenous Continuous Dahal, Binaya, MD 33.3 mL/hr at 05/16/24 1132 60 mg/hr at 05/16/24 1132   fluticasone  furoate-vilanterol (BREO ELLIPTA ) 100-25 MCG/ACT 1 puff  1 puff Inhalation Daily Shona Laurence N, DO   1 puff at 05/16/24 0808   heparin  ADULT infusion 100 units/mL (25000 units/250mL)  1,500 Units/hr Intravenous Continuous Hammons, Kimberly B, RPH 15 mL/hr at 05/16/24  0914 1,500 Units/hr at 05/16/24 9085   hydrALAZINE  (APRESOLINE ) injection 10 mg  10 mg Intravenous Q6H PRN Dahal, Chapman, MD       HYDROmorphone  (DILAUDID ) injection 0.25 mg  0.25 mg Intravenous Q4H PRN Tammy Sor, PA-C       insulin  aspart (novoLOG ) injection 0-9 Units  0-9 Units Subcutaneous Q4H Dahal, Binaya, MD   2 Units at 05/16/24 1145   ipratropium-albuterol  (DUONEB) 0.5-2.5 (3) MG/3ML nebulizer solution 3 mL  3 mL Nebulization Q2H PRN Ghimire, Donalda HERO, MD       ondansetron  (ZOFRAN ) injection 4 mg  4 mg Intravenous Q6H PRN Ghimire, Donalda HERO, MD       oxyCODONE  (Oxy IR/ROXICODONE ) immediate release tablet 5 mg  5 mg Oral Q6H PRN Hall, Carole N, DO         Discharge Medications: Please see discharge summary for a list of discharge medications.  Relevant Imaging Results:  Relevant Lab Results:   Additional Information SSN [id number]  Sharyne Drum, Student-Social Work

## 2024-05-16 NOTE — Progress Notes (Addendum)
 PROGRESS NOTE        PATIENT DETAILS Name: Rhonda Hickman Age: 68 y.o. Sex: female Date of Birth: [birth year] Admit Date: 05/14/2024 Admitting Physician Terry LOISE Hurst, DO ERE:[email or web address], Aureliano SAUNDERS, MD  Brief Summary: Patient is a 68 y.o.  female with history of PAF on Eliquis-who recently underwent right knee replacement on 10/31-subsequently discharged to SNF on 11/3-brought to the ED on 11/4 for altered mental status, A-fib RVR-found to have SBO.  Significant events: 11/4>> admit to [facility]  Significant studies: 11/4>> CT head: No acute abnormality 11/5>> CT chest/abdomen/pelvis: Findings suspicious for distal SBO.  Moderate stool burden.  Significant microbiology data: 11/4>> blood culture: No growth  Procedures: None  Consults: General Surgery Cardiology  Subjective: Seen twice this morning-first time somnolent-hard to arouse but will follow commands, second time much more awake-still sleepy though-but able to answer questions-denies any abdominal pain-does not feel nauseous.  Feels sleepy and wants to go back to sleep (received Dilaudid  and Phenergan  last night/early morning)  Objective: Vitals: Blood pressure (!) 114/91, pulse (!) 105, temperature 97.9 F (36.6 C), temperature source Oral, resp. rate 17, height 6' (1.829 m), weight 106 kg, SpO2 91%.   Exam: Gen Exam:Alert awake-not in any distress HEENT:atraumatic, normocephalic Chest: B/L clear to auscultation anteriorly CVS:S1S2 regular Abdomen:soft non tender, non distended Extremities:no edema.  Right knee operative area appears benign. Neurology: Non focal Skin: no rash  Pertinent Labs/Radiology:    Latest Ref Rng & Units 05/16/2024    3:34 AM 05/15/2024   12:15 PM 05/15/2024    4:29 AM  CBC  WBC 4.0 - 10.5 K/uL 9.5  11.1  8.8   Hemoglobin 12.0 - 15.0 g/dL 8.1  8.2  7.6   Hematocrit 36.0 - 46.0 % 25.0  25.1  23.9   Platelets 150 - 400 K/uL 355  337  283     Lab Results  Component  Value Date   NA 137 05/16/2024   K 3.6 05/16/2024   CL 103 05/16/2024   CO2 19 (L) 05/16/2024      Assessment/Plan: SBO Remains NPO She removed her NG tube last night Continue supportive care If vomiting reoccurs-replace NG tube General Surgery following  PAF with RVR Rate better controlled but remains in RVR Continue amiodarone infusion Continue heparin  infusion Cardiology following.  Acute toxic metabolic encephalopathy Initially suspected to be secondary to Depakote  use-still remains a bit somnolent (although improving)-which I suspect is secondary to Compazine /Dilaudid  that she received earlier this morning Minimize sedating medications as much as possible-switch from Compazine  to Zofran  Minimize Dilaudid   Lactic acidosis Probably secondary to dehydration from vomiting No evidence of PNA on CT imaging-stop Zosyn.  Chronic HFpEF Euvolemic Diuretics as needed  AKI on CKD stage II AKI hemodynamically mediated-improving with supportive care  Microcytic anemia Appears to have microcytic anemia at baseline (probable iron deficiency-on iron supplementation at home)-hemoglobin worse due to acute illness/perhaps perioperative blood loss from recent knee surgery.  No overt GI bleeding noted. Will check anemia panel with a.m. labs Follow CBC  DM-2 CBG stable SSI  HLD Statin on hold due to n.p.o. status-resume when able  COPD Not in exacerbation Continue bronchodilators  Mood disorder Somewhat somnolent but improving this morning Apparently not on scheduled Depakote  at home although listed differently on MAR Wellbutrin  on hold-resume when able.  Recent right knee replacement on 10/31 (Dr. Vernetta)  PT/OT Already on anticoagulation for A-fib Minimal narcotics if possible.  Class 1 Obesity: Estimated body mass index is 31.69 kg/m as calculated from the following:   Height as of this encounter: 6' (1.829 m).   Weight as of this encounter: 106 kg.   Code  status:   Code Status: Full Code   DVT Prophylaxis:IV heparin    Family Communication: None at bedside   Disposition Plan: Status is: Inpatient Remains inpatient appropriate because: Severity of illness   Planned Discharge Destination:Skilled nursing facility   Diet: Diet Order             Diet NPO time specified  Diet effective now                     Antimicrobial agents: Anti-infectives (From admission, onward)    Start     Dose/Rate Route Frequency Ordered Stop   05/15/24 0600  piperacillin-tazobactam (ZOSYN) IVPB 3.375 g        3.375 g 12.5 mL/hr over 240 Minutes Intravenous Every 8 hours 05/15/24 0031     05/14/24 2145  cefTRIAXone  (ROCEPHIN ) 2 g in sodium chloride  0.9 % 100 mL IVPB        2 g 200 mL/hr over 30 Minutes Intravenous  Once 05/14/24 2131 05/14/24 2357   05/14/24 2145  azithromycin  (ZITHROMAX ) 500 mg in sodium chloride  0.9 % 250 mL IVPB        500 mg 250 mL/hr over 60 Minutes Intravenous  Once 05/14/24 2131 05/15/24 0116        MEDICATIONS: Scheduled Meds:  fluticasone  furoate-vilanterol  1 puff Inhalation Daily   insulin  aspart  0-9 Units Subcutaneous Q4H   Continuous Infusions:  amiodarone 60 mg/hr (05/16/24 0523)   heparin  1,500 Units/hr (05/16/24 0914)   piperacillin-tazobactam (ZOSYN)  IV 3.375 g (05/16/24 0521)   PRN Meds:.acetaminophen , hydrALAZINE , HYDROmorphone  (DILAUDID ) injection, LORazepam , ondansetron  (ZOFRAN ) IV, oxyCODONE    I have personally reviewed following labs and imaging studies  LABORATORY DATA: CBC: Recent Labs  Lab 05/13/24 0756 05/14/24 1940 05/14/24 1953 05/15/24 0429 05/15/24 1215 05/16/24 0334  WBC 15.9* 13.7*  --  8.8 11.1* 9.5  NEUTROABS  --  9.1*  --  5.3  --  6.2  HGB 9.6* 9.6* 10.2* 7.6* 8.2* 8.1*  HCT 30.4* 29.8* 30.0* 23.9* 25.1* 25.0*  MCV 80.2 80.5  --  81.0 79.9* 79.4*  PLT 291 334  --  283 337 355    Basic Metabolic Panel: Recent Labs  Lab 05/14/24 0326 05/14/24 1940  05/14/24 1953 05/15/24 0429 05/15/24 1215 05/16/24 0334  NA 133* 132* 135 129* 131* 137  K 4.4 5.9* 5.7* 4.3 4.3 3.6  CL 96* 96*  --  92* 98 103  CO2 23 20*  --  20* 18* 19*  GLUCOSE 136* 110*  --  470* 251* 219*  BUN 52* 64*  --  46* 46* 36*  CREATININE 1.51* 1.64*  --  1.30* 1.37* 1.26*  CALCIUM  9.2 8.2*  --  7.5* 7.9* 8.3*  MG  --   --   --  2.3  --   --   PHOS  --   --   --  3.7  --   --     GFR: Estimated Creatinine Clearance: 59 mL/min (A) (by C-G formula based on SCr of 1.26 mg/dL (H)).  Liver Function Tests: Recent Labs  Lab 05/14/24 1940 05/15/24 0429  AST 34 19  ALT 28 20  ALKPHOS 73 56  BILITOT  0.9 0.5  PROT 7.6 6.7  ALBUMIN 2.2* 2.3*   No results for input(s): LIPASE, AMYLASE in the last 168 hours. Recent Labs  Lab 05/14/24 1940  AMMONIA 28    Coagulation Profile: No results for input(s): INR, PROTIME in the last 168 hours.  Cardiac Enzymes: No results for input(s): CKTOTAL, CKMB, CKMBINDEX, TROPONINI in the last 168 hours.  BNP (last 3 results) No results for input(s): PROBNP in the last 8760 hours.  Lipid Profile: No results for input(s): CHOL, HDL, LDLCALC, TRIG, CHOLHDL, LDLDIRECT in the last 72 hours.  Thyroid  Function Tests: No results for input(s): TSH, T4TOTAL, FREET4, T3FREE, THYROIDAB in the last 72 hours.  Anemia Panel: No results for input(s): VITAMINB12, FOLATE, FERRITIN, TIBC, IRON, RETICCTPCT in the last 72 hours.  Urine analysis:    Component Value Date/Time   COLORURINE YELLOW 05/15/2024 1336   APPEARANCEUR CLEAR 05/15/2024 1336   LABSPEC 1.025 05/15/2024 1336   PHURINE 6.0 05/15/2024 1336   GLUCOSEU NEGATIVE 05/15/2024 1336   HGBUR MODERATE (A) 05/15/2024 1336   BILIRUBINUR NEGATIVE 05/15/2024 1336   BILIRUBINUR negative 12/08/2020 1009   BILIRUBINUR negative 05/04/2020 1349   KETONESUR NEGATIVE 05/15/2024 1336   PROTEINUR NEGATIVE 05/15/2024 1336   UROBILINOGEN 0.2  12/08/2020 1009   UROBILINOGEN 0.2 09/12/2013 1642   NITRITE POSITIVE (A) 05/15/2024 1336   LEUKOCYTESUR SMALL (A) 05/15/2024 1336    Sepsis Labs: Lactic Acid, Venous    Component Value Date/Time   LATICACIDVEN 1.5 05/16/2024 0334    MICROBIOLOGY: Recent Results (from the past 240 hours)  Surgical pcr screen     Status: None   Collection Time: 05/06/24  2:31 PM   Specimen: Nasal Mucosa; Nasal Swab  Result Value Ref Range Status   MRSA, PCR NEGATIVE NEGATIVE Final   Staphylococcus aureus NEGATIVE NEGATIVE Final    Comment: (NOTE) The Xpert SA Assay (FDA approved for NASAL specimens in patients 12 years of age and older), is one component of a comprehensive surveillance program. It is not intended to diagnose infection nor to guide or monitor treatment. Performed at [facility], [address], [place], [place] [address]   Blood Culture (routine x 2)     Status: None (Preliminary result)   Collection Time: 05/14/24  7:40 PM   Specimen: BLOOD LEFT ARM  Result Value Ref Range Status   Specimen Description BLOOD LEFT ARM  Final   Special Requests   Final    BOTTLES DRAWN AEROBIC AND ANAEROBIC Blood Culture adequate volume   Culture   Final    NO GROWTH 2 DAYS Performed at [facility], [address] [place], [place] [address]    Report Status PENDING  Incomplete  Blood Culture (routine x 2)     Status: None (Preliminary result)   Collection Time: 05/14/24  7:41 PM   Specimen: BLOOD RIGHT ARM  Result Value Ref Range Status   Specimen Description BLOOD RIGHT ARM  Final   Special Requests   Final    BOTTLES DRAWN AEROBIC AND ANAEROBIC Blood Culture adequate volume   Culture   Final    NO GROWTH 2 DAYS Performed at [facility], [address], [place], [place] [address]    Report Status PENDING  Incomplete    RADIOLOGY STUDIES/RESULTS: DG Abd Portable 1V Result Date: 05/16/2024 EXAM: 1 VIEW XRAY OF THE ABDOMEN 05/16/2024 08:21:00 AM  COMPARISON: 05/15/2024 CLINICAL HISTORY: SBO (small bowel obstruction) (HCC) FINDINGS: BOWEL: Dilute contrast material opacifies the  small bowel loops. Diminished small bowel gas limits assessment for bowel obstruction. However, there are continued mildly dilated loops of small bowel in the right lower quadrant of the abdomen measuring up to 4.1 cm. Gas and stool noted within the colon up to the rectum. SOFT TISSUES: Cholecystectomy clips noted. No opaque urinary calculi. BONES: No acute osseous abnormality. IMPRESSION: 1. Continued mildly dilated small-bowel loops in the right lower quadrant measuring up to 4.1 cm, which may reflect ongoing ileus or partial small-bowel obstruction. Electronically signed by: Waddell Calk MD 05/16/2024 08:32 AM EST RP Workstation: [id number]   DG Abd Portable 1V-Small Bowel Obstruction Protocol-initial, 8 hr delay Result Date: 05/15/2024 CLINICAL DATA:  Small-bowel obstruction EXAM: PORTABLE ABDOMEN - 1 VIEW COMPARISON:  05/15/2024 FINDINGS: Three supine frontal views of the abdomen and pelvis are obtained. Enteric catheter has been removed. The previously administered oral contrast remains within the stomach. Slow diffusion of oral contrast into the proximal small bowel. No contrast has reached the colon by the time of imaging. Continued distended gas-filled loops of small bowel are seen within the right hemiabdomen. IMPRESSION: 1. Continued findings of small-bowel obstruction. The previously administered oral contrast has not yet reached the colon by the time of imaging. 2. Interval removal of the enteric catheter. Electronically Signed   By: Ozell Daring M.D.   On: 05/15/2024 14:56   DG Abd Portable 1 View Result Date: 05/15/2024 CLINICAL DATA:  NG tube placement EXAM: PORTABLE ABDOMEN - 1 VIEW COMPARISON:  05/15/2024 FINDINGS: Similarly positioned esophagogastric tube, with the tip obscured by enteric contrast in the stomach. The last side hole remains at the level of  the GE junction. Similar gaseous distention of the small bowel in the upper abdomen.No pneumoperitoneum. Cholecystectomy clips. No acute fracture or destructive lesion. IMPRESSION: 1. Similarly positioned esophagogastric tube, with the tip obscured by enteric contrast in the stomach. 2. Redemonstrated gas-filled and dilated small bowel in the upper abdomen. Electronically Signed   By: Rogelia Myers M.D.   On: 05/15/2024 09:02   DG Abd Portable 1V-Small Bowel Protocol-Position Verification Result Date: 05/15/2024 EXAM: 1 VIEW XRAY OF THE ABDOMEN 05/15/2024 03:54:00 AM COMPARISON: CT abdomen and pelvis 08/06/2023. CLINICAL HISTORY: Encounter for imaging study to confirm nasogastric (NG) tube placement. FINDINGS: LINES, TUBES AND DEVICES: Enteric tube in place with tip overlying the expected region of the gastric lumen and side port overlying the expected region of the gastroesophageal junction. BOWEL: Several loops of mildly dilated small bowel within the abdomen. SOFT TISSUES: Right upper quadrant surgical clips noted. No opaque urinary calculi. BONES: No acute osseous abnormality. IMPRESSION: 1. Enteric tube side port at the gastroesophageal junction, recommend advancement of 5 cm to ensure side hole placement within the stomach 2. Stable bowel gas pattern from earlier CT. Electronically signed by: Helayne Hurst MD 05/15/2024 04:26 AM EST RP Workstation: [id number]   CT CHEST ABDOMEN PELVIS WO CONTRAST Result Date: 05/15/2024 EXAM: CT CHEST, ABDOMEN AND PELVIS WITHOUT CONTRAST 05/15/2024 01:35:12 AM TECHNIQUE: CT of the chest, abdomen and pelvis was performed without the administration of intravenous contrast. Multiplanar reformatted images are provided for review. Automated exposure control, iterative reconstruction, and/or weight based adjustment of the mA/kV was utilized to reduce the radiation dose to as low as reasonably achievable. COMPARISON: CT heart 11/03/2022, CT abdomen and pelvis 05/22/2021.  CLINICAL HISTORY: Sepsis. FINDINGS: CHEST: MEDIASTINUM AND LYMPH NODES: Scattered calcifications in the left anterior descending coronary artery. Pericardium is unremarkable. The central airways are clear. No mediastinal, hilar or axillary lymphadenopathy.  LUNGS AND PLEURA: Lower lobe atelectasis bilaterally. No focal consolidation or pulmonary edema. No pleural effusion or pneumothorax. ABDOMEN AND PELVIS: LIVER: The liver is unremarkable. GALLBLADDER AND BILE DUCTS: Prior cholecystectomy. No biliary ductal dilatation. SPLEEN: No acute abnormality. PANCREAS: No acute abnormality. ADRENAL GLANDS: No acute abnormality. KIDNEYS, URETERS AND BLADDER: No stones in the kidneys or ureters. No hydronephrosis. No perinephric or periureteral stranding. Air within the urinary bladder, presumably from recent catheterization. GI AND BOWEL: Stomach demonstrates no acute abnormality. Mildly dilated fluid-filled small bowel loops into the pelvis. Distal small bowel is decompressed. Cannot exclude distal small bowel obstruction. Moderate stool burden throughout the colon. Normal appendix. REPRODUCTIVE ORGANS: No acute abnormality. PERITONEUM AND RETROPERITONEUM: Small amount of free fluid in the cul-de-sac. No free air. VASCULATURE: Aortic atherosclerosis. Aorta is normal in caliber. ABDOMINAL AND PELVIS LYMPH NODES: No lymphadenopathy. BONES AND SOFT TISSUES: No acute osseous abnormality. No focal soft tissue abnormality. IMPRESSION: 1. Findings suspicious for distal small bowel obstruction, given mildly dilated fluid-filled small bowel loops with decompressed distal small bowel. 2. Moderate colonic stool burden. 3. Coronary artery disease, aortic atherosclerosis Electronically signed by: Franky Crease MD 05/15/2024 01:39 AM EST RP Workstation: [id number]   CT Head Wo Contrast Result Date: 05/14/2024 EXAM: CT HEAD WITHOUT CONTRAST 05/14/2024 08:02:51 PM TECHNIQUE: CT of the head was performed without the administration of  intravenous contrast. Automated exposure control, iterative reconstruction, and/or weight based adjustment of the mA/kV was utilized to reduce the radiation dose to as low as reasonably achievable. COMPARISON: 05/13/2024. CLINICAL HISTORY: Delirium. FINDINGS: BRAIN AND VENTRICLES: No acute hemorrhage. No evidence of acute infarct. No hydrocephalus. No extra-axial collection. No mass effect or midline shift. There is atrophy and chronic small vessel disease throughout the deep white matter. ORBITS: No acute abnormality. SINUSES: No acute abnormality. SOFT TISSUES AND SKULL: No acute soft tissue abnormality. No skull fracture. IMPRESSION: 1. No acute intracranial abnormality. 2. Atrophy and chronic small vessel disease throughout the deep white matter. Electronically signed by: Franky Crease MD 05/14/2024 08:08 PM EST RP Workstation: [id number]   DG Chest Port 1 View Result Date: 05/14/2024 EXAM: 1 VIEW(S) XRAY OF THE CHEST 05/14/2024 07:55:00 PM COMPARISON: 10/02/2023 CLINICAL HISTORY: Questionable sepsis - evaluate for abnormality FINDINGS: LUNGS AND PLEURA: Low lung volumes. Right basilar atelectasis. No pulmonary edema. No pleural effusion. No pneumothorax. HEART AND MEDIASTINUM: No acute abnormality of the cardiac and mediastinal silhouettes. BONES AND SOFT TISSUES: No acute osseous abnormality. IMPRESSION: 1. Low lung volumes with right basilar atelectasis. Electronically signed by: Franky Crease MD 05/14/2024 08:05 PM EST RP Workstation: [id number]     LOS: 2 days   Donalda Applebaum, MD  [facility]    To contact the attending provider between 7A-7P or the covering provider during after hours 7P-7A, please log into the web site [email or web address] and access using universal [place] password for that web site. If you do not have the password, please call the hospital operator.  05/16/2024, 10:10 AM

## 2024-05-17 DIAGNOSIS — D649 Anemia, unspecified: Secondary | ICD-10-CM

## 2024-05-17 DIAGNOSIS — R4182 Altered mental status, unspecified: Secondary | ICD-10-CM | POA: Diagnosis not present

## 2024-05-17 DIAGNOSIS — I4891 Unspecified atrial fibrillation: Secondary | ICD-10-CM | POA: Diagnosis not present

## 2024-05-17 DIAGNOSIS — K56609 Unspecified intestinal obstruction, unspecified as to partial versus complete obstruction: Secondary | ICD-10-CM | POA: Diagnosis not present

## 2024-05-17 DIAGNOSIS — I483 Typical atrial flutter: Secondary | ICD-10-CM | POA: Diagnosis not present

## 2024-05-17 LAB — FOLATE: Folate: 5.2 ng/mL — ABNORMAL LOW (ref >5.9)

## 2024-05-17 LAB — CBC
HCT: 23.5 % — ABNORMAL LOW (ref 36.0–46.0)
Hemoglobin: 7.4 g/dL — ABNORMAL LOW (ref 12.0–15.0)
MCH: 25.5 pg — ABNORMAL LOW (ref 26.0–34.0)
MCHC: 31.5 g/dL (ref 30.0–36.0)
MCV: 81 fL (ref 80.0–100.0)
Platelets: 348 K/uL (ref 150–400)
RBC: 2.9 MIL/uL — ABNORMAL LOW (ref 3.87–5.11)
RDW: 19.1 % — ABNORMAL HIGH (ref 11.5–15.5)
WBC: 10.5 K/uL (ref 4.0–10.5)
nRBC: 0.8 % — ABNORMAL HIGH (ref 0.0–0.2)

## 2024-05-17 LAB — COMPREHENSIVE METABOLIC PANEL WITH GFR
ALT: 13 U/L (ref 0–44)
AST: 14 U/L — ABNORMAL LOW (ref 15–41)
Albumin: 2 g/dL — ABNORMAL LOW (ref 3.5–5.0)
Alkaline Phosphatase: 53 U/L (ref 38–126)
Anion gap: 13 (ref 5–15)
BUN: 18 mg/dL (ref 8–23)
CO2: 19 mmol/L — ABNORMAL LOW (ref 22–32)
Calcium: 7.7 mg/dL — ABNORMAL LOW (ref 8.9–10.3)
Chloride: 106 mmol/L (ref 98–111)
Creatinine, Ser: 1.03 mg/dL — ABNORMAL HIGH (ref 0.44–1.00)
GFR, Estimated: 60 mL/min — ABNORMAL LOW (ref >60)
Glucose, Bld: 167 mg/dL — ABNORMAL HIGH (ref 70–99)
Potassium: 3.2 mmol/L — ABNORMAL LOW (ref 3.5–5.1)
Sodium: 138 mmol/L (ref 135–145)
Total Bilirubin: 0.6 mg/dL (ref 0.0–1.2)
Total Protein: 6.2 g/dL — ABNORMAL LOW (ref 6.5–8.1)

## 2024-05-17 LAB — GLUCOSE, CAPILLARY
Glucose-Capillary: 167 mg/dL — ABNORMAL HIGH (ref 70–99)
Glucose-Capillary: 165 mg/dL — ABNORMAL HIGH (ref 70–99)
Glucose-Capillary: 237 mg/dL — ABNORMAL HIGH (ref 70–99)
Glucose-Capillary: 226 mg/dL — ABNORMAL HIGH (ref 70–99)
Glucose-Capillary: 253 mg/dL — ABNORMAL HIGH (ref 70–99)
Glucose-Capillary: 166 mg/dL — ABNORMAL HIGH (ref 70–99)

## 2024-05-17 LAB — HEPARIN LEVEL (UNFRACTIONATED)
Heparin Unfractionated: 0.3 [IU]/mL (ref 0.30–0.70)
Heparin Unfractionated: 0.35 [IU]/mL (ref 0.30–0.70)

## 2024-05-17 LAB — IRON AND TIBC
Iron: 13 ug/dL — ABNORMAL LOW (ref 28–170)
Saturation Ratios: 7 % — ABNORMAL LOW (ref 10.4–31.8)
TIBC: 195 ug/dL — ABNORMAL LOW (ref 250–450)
UIBC: 182 ug/dL

## 2024-05-17 LAB — APTT
aPTT: 55 s — ABNORMAL HIGH (ref 24–36)
aPTT: 76 s — ABNORMAL HIGH (ref 24–36)

## 2024-05-17 LAB — FERRITIN: Ferritin: 332 ng/mL — ABNORMAL HIGH (ref 11–307)

## 2024-05-17 LAB — RETICULOCYTES
Immature Retic Fract: 32.5 % — ABNORMAL HIGH (ref 2.3–15.9)
RBC.: 2.85 MIL/uL — ABNORMAL LOW (ref 3.87–5.11)
Retic Count, Absolute: 80.9 K/uL (ref 19.0–186.0)
Retic Ct Pct: 2.8 % (ref 0.4–3.1)

## 2024-05-17 LAB — VITAMIN B12: Vitamin B-12: 378 pg/mL (ref 180–914)

## 2024-05-17 MED ORDER — POTASSIUM CHLORIDE 10 MEQ/100ML IV SOLN
10.0000 meq | INTRAVENOUS | Status: AC
  Administered 2024-05-17 (×3): 10 meq via INTRAVENOUS
  Filled 2024-05-17 (×3): qty 100

## 2024-05-17 MED ORDER — METOPROLOL TARTRATE 12.5 MG HALF TABLET
12.5000 mg | ORAL_TABLET | Freq: Four times a day (QID) | ORAL | Status: AC
  Administered 2024-05-17 – 2024-05-19 (×9): 12.5 mg via ORAL
  Filled 2024-05-17 (×9): qty 1

## 2024-05-17 MED ORDER — MORPHINE SULFATE (PF) 2 MG/ML IV SOLN
2.0000 mg | INTRAVENOUS | Status: DC | PRN
  Administered 2024-05-17: 2 mg via INTRAVENOUS
  Filled 2024-05-17: qty 1

## 2024-05-17 NOTE — Progress Notes (Addendum)
 Physical Therapy Treatment Patient Details Name: Rhonda Hickman MRN: [id number] DOB: 11/26/1955 Today's Date: 05/17/2024   History of Present Illness 68 y.o. female presents to [facility] 05/14/24 from SNF due to hypersomnolence and tachycardia. Pt with suspicion for distal SBO. Of note, pt d/c from hospital 11/4 after R TKA. PMHx:  severe OA status post left total knee replacement (06/20/23), obesity, COPD, type 2 diabetes, hypertension, hyperlipidemia, paroxysmal A-fib on Xarelto , CKD 3B, TIA, CVA, neuropathy, vertigo    PT Comments  The pt was provided dilaudid  right at the start of the session to try to improve pt's tolerance with the session considering her severe R knee pain with all mobility/ROM. Pt is only achieving ~30' knee flexion AAROM at this time. Her cognition is better, but still impaired. She follows simple multi-modal cues with extra time, however her response speed slowed further with IV pain meds. The pt is limited by anxiety, often stating just wait or don't rush me, even though provided at least a few minutes to attempt movements on her own per her request before therapist intervened. Pt also getting distracted by her food, at one point refusing to put down her shaved ice, impacting the ability to complete the session until pt became compliant with putting it to the side momentarily. She displayed poor initiation to use either leg to attempt to stand, and often reached out with her L UE for furniture to push herself posteriorly during transfers, resulting in her needing max-total assist to come to a partial stand and squat pivot to L from bed to chair. She remains at high risk for falls. She also tends to lean to the L to offload her R leg, likely due to the pain, but pt with poor awareness of how this was impacting her safety or mobility. Will continue to follow acutely. Pt in chair with alarm on, fall pad on the floor, and hoyer pad under her. Recommended to nursing to use a maximove  hoyer lift to transfer pt at this time.    If plan is discharge home, recommend the following: Two people to help with walking and/or transfers;Two people to help with bathing/dressing/bathroom;Assistance with cooking/housework;Assist for transportation;Help with stairs or ramp for entrance   Can travel by private vehicle     No  Equipment Recommendations  Wheelchair (measurements PT);Wheelchair cushion (measurements PT);Hospital bed;Hoyer lift    Recommendations for Other Services       Precautions / Restrictions Precautions Precautions: Fall Recall of Precautions/Restrictions: Impaired Precaution/Restrictions Comments: tachycardia, R TKA on 10/31 Required Braces or Orthoses:  (no KI needed per Dr. Vernetta 11/7) Restrictions Weight Bearing Restrictions Per Provider Order: Yes RLE Weight Bearing Per Provider Order: Weight bearing as tolerated     Mobility  Bed Mobility Overal bed mobility: Needs Assistance Bed Mobility: Supine to Sit     Supine to sit: Used rails, Max assist, HOB elevated     General bed mobility comments: Provided pt with several minutes to try to move her R leg towards L EOB with no success, pt finally allowing therapist to lift and unweight her R leg off the bed surface to allow her to adduct it to the L. Provided gentle support under R leg throughout and assistance to move bil legs off L EOB as needed. Cued pt to grab therapist's arms to pull trunk up to sit as therapist pulled bed pad to pivot hips, maxA needed as pt leaned heavily to the L    Transfers Overall transfer level: Needs assistance  Equipment used: 1 person hand held assist Transfers: Sit to/from Stand, Bed to chair/wheelchair/BSC Sit to Stand: Max assist, Total assist     Squat pivot transfers: Total assist     General transfer comment: L knee blocked to prevent anterior sliding and cues provided for pt to hold onto therapist with bil UEs and pull up to stand while pushing through at  least her L leg if not both legs to stand. However, pt often displayed little to no activation of her L leg and no activation of her R and little initiation to pull through her arms to stand, often reaching her L hand away from therapist to push on a piece of furniture instead, often pushing herself posteriorly and needing max cues to keep hands on PT for safety instead. Pt came to a half stand 1x with maxA but often needed total assist for the other > x5 reps to clear her buttocks to gradually squat pivot to L from bed to chair, arm rest of chair dropped for safety.    Ambulation/Gait               General Gait Details: unable   Stairs             Wheelchair Mobility     Tilt Bed    Modified Rankin (Stroke Patients Only)       Balance Overall balance assessment: Needs assistance Sitting-balance support: Single extremity supported, Bilateral upper extremity supported, Feet supported Sitting balance-Leahy Scale: Poor Sitting balance - Comments: L lateral lean, max cues to correct with min success, min-modA for static sitting balance with UE support Postural control: Left lateral lean Standing balance support: Bilateral upper extremity supported Standing balance-Leahy Scale: Zero Standing balance comment: Unable to come to full stand, only half stand with max-total assist                            Communication Communication Communication: Impaired Factors Affecting Communication: Reduced clarity of speech;Difficulty expressing self (when provided IV pain meds)  Cognition Arousal: Alert Behavior During Therapy: Anxious   PT - Cognitive impairments: Awareness, Attention, Initiation, Sequencing, Problem solving, Safety/Judgement, Orientation Difficult to assess due to: Level of arousal (limited verbalizations) Orientation impairments: Situation (did not ask date)                   PT - Cognition Comments: Pt with decreased attention with IV pain  meds. Disoriented to current situation but knew she had a recent TKA. Pt often stating just wait and don't rush me even though provided at least several minutes of opportunity to attempt on her own per her request, but often with little to no initiation by pt. Pt anxious, fearful of falling, and often actively resisting or not assisting mobility despite max cues. Poor initiation to use L leg to assist her. Decreased problem-solving, needing cues to allow therapist to lift her R leg slightly off the bed so she could successfully move it as pt was unsuccessful in moving it on her own for several minutes. Poor safety awareness, often leaning heavily to L side with poor awareness of it placing her at risk for falls, especially when she would reach for a drink. Following commands: Impaired Following commands impaired: Follows one step commands inconsistently, Follows one step commands with increased time    Cueing Cueing Techniques: Tactile cues, Gestural cues, Verbal cues, Visual cues  Exercises Total Joint Exercises Heel Slides: AAROM, Right,  5 reps, Supine    General Comments        Pertinent Vitals/Pain Pain Assessment Pain Assessment: Faces Faces Pain Scale: Hurts whole lot Pain Location: R LE with movement Pain Descriptors / Indicators: Grimacing, Guarding, Moaning, Sore Pain Intervention(s): Limited activity within patient's tolerance, Monitored during session, Repositioned, Patient requesting pain meds-RN notified, RN gave pain meds during session (RN gave IV pain meds at very start of session)    Home Living                          Prior Function            PT Goals (current goals can now be found in the care plan section) Acute Rehab PT Goals Patient Stated Goal: to improve PT Goal Formulation: With patient Time For Goal Achievement: 05/29/24 Potential to Achieve Goals: Fair Progress towards PT goals: Progressing toward goals    Frequency    Min  2X/week      PT Plan      Co-evaluation              AM-PAC PT 6 Clicks Mobility   Outcome Measure  Help needed turning from your back to your side while in a flat bed without using bedrails?: A Lot Help needed moving from lying on your back to sitting on the side of a flat bed without using bedrails?: A Lot Help needed moving to and from a bed to a chair (including a wheelchair)?: A Lot Help needed standing up from a chair using your arms (e.g., wheelchair or bedside chair)?: A Lot Help needed to walk in hospital room?: Total Help needed climbing 3-5 steps with a railing? : Total 6 Click Score: 10    End of Session Equipment Utilized During Treatment: Gait belt Activity Tolerance: Patient limited by pain;Other (comment) (limited by anxiety) Patient left: with call bell/phone within reach;in chair;with chair alarm set;Other (comment) (with hoyer lift pad under pt) Nurse Communication: Mobility status;Need for lift equipment (maximove) PT Visit Diagnosis: Unsteadiness on feet (R26.81);History of falling (Z91.81);Muscle weakness (generalized) (M62.81);Difficulty in walking, not elsewhere classified (R26.2);Pain Pain - Right/Left: Right Pain - part of body: Knee     Time: 8759-8672 PT Time Calculation (min) (ACUTE ONLY): 47 min  Charges:    $Therapeutic Activity: 38-52 mins PT General Charges $$ ACUTE PT VISIT: 1 Visit                     Rhonda Hickman, PT, DPT Acute Rehabilitation Services  Office: [phone]    Rhonda CHRISTELLA Hickman 05/17/2024, 3:02 PM

## 2024-05-17 NOTE — Plan of Care (Signed)
  Problem: Education: Goal: Ability to describe self-care measures that may prevent or decrease complications (Diabetes Survival Skills Education) will improve Outcome: Progressing Goal: Individualized Educational Video(s) Outcome: Progressing   Problem: Coping: Goal: Ability to adjust to condition or change in health will improve Outcome: Progressing   Problem: Metabolic: Goal: Ability to maintain appropriate glucose levels will improve Outcome: Progressing   Problem: Nutritional: Goal: Maintenance of adequate nutrition will improve Outcome: Progressing

## 2024-05-17 NOTE — Plan of Care (Signed)
  Problem: Education: Goal: Ability to describe self-care measures that may prevent or decrease complications (Diabetes Survival Skills Education) will improve Outcome: Progressing   Problem: Coping: Goal: Ability to adjust to condition or change in health will improve Outcome: Progressing   Problem: Fluid Volume: Goal: Ability to maintain a balanced intake and output will improve Outcome: Progressing   Problem: Nutritional: Goal: Maintenance of adequate nutrition will improve Outcome: Progressing   Problem: Skin Integrity: Goal: Risk for impaired skin integrity will decrease Outcome: Progressing   Problem: Health Behavior/Discharge Planning: Goal: Ability to manage health-related needs will improve Outcome: Progressing   Problem: Pain Managment: Goal: General experience of comfort will improve and/or be controlled Outcome: Progressing   Problem: Safety: Goal: Ability to remain free from injury will improve Outcome: Progressing   Problem: Skin Integrity: Goal: Risk for impaired skin integrity will decrease Outcome: Progressing

## 2024-05-17 NOTE — Progress Notes (Signed)
 PHARMACY - ANTICOAGULATION CONSULT NOTE  Pharmacy Consult for Heparin  (Xarelto  on hold) Indication: atrial fibrillation  Allergies  Allergen Reactions   Aspirin  Nausea Only and Other (See Comments)    Causes stomach pain    Patient Measurements: Height: 6' (182.9 cm) Weight: 106 kg (233 lb 11 oz) IBW/kg (Calculated) : 73.1 HEPARIN  DW (KG): 95.8  Vital Signs: Temp: 99 F (37.2 C) (11/07 1602) Temp Source: Axillary (11/07 1602) BP: 114/83 (11/07 1602) Pulse Rate: 99 (11/07 1602)  Labs: Recent Labs    05/15/24 1215 05/16/24 0334 05/16/24 2013 05/17/24 0556 05/17/24 1759  HGB 8.2* 8.1*  --  7.4*  --   HCT 25.1* 25.0*  --  23.5*  --   PLT 337 355  --  348  --   APTT  --  62* 51* 55* 76*  HEPARINUNFRC  --  0.47 0.32 0.30 0.35  CREATININE 1.37* 1.26*  --  1.03*  --     Estimated Creatinine Clearance: 72.2 mL/min (A) (by C-G formula based on SCr of 1.03 mg/dL (H)).  Assessment: 68 yo F presented with AMS after recent discharge on 11/4 s/p right knee replacement. Pharmacy consulted to dose heparin  until patient able to take Xarelto  for afib. Per facility did not receive evening dose of Xarelto , last dose documented 11/3 at [facility]   aPTT and heparin  level now both at low end of therapeutic goal range on 1850 units/hr.   Will increase slightly to target mid range of goal.  Last dose of Xarelto  4d ago so expect levels are correlating but will check both aPTT and heparin  level with AM labs.    Goal of Therapy:  Heparin  level 0.3-0.7 units/ml aPTT 66-102 seconds Monitor platelets by anticoagulation protocol: Yes   Plan:  Increase heparin  drip to 1950 units/hr Daily aPTT and heparin  level with AM labs; daily CBC. Follow up for change back to Xarelto  when able.  [employer], Pharm.D., BCPS Clinical Pharmacist  **Pharmacist phone directory can be found on [email or web address] listed under [facility].  05/17/2024 7:29 PM

## 2024-05-17 NOTE — Progress Notes (Signed)
   Rounding Note    Patient Name: Rhonda Hickman Date of Encounter: 05/17/2024  [facility] Cardiologist: Lonni LITTIE [name], MD   Subjective   Awake, alert, conversant today. She feels markedly better today and has been trying sips of water with good results. Cannot feel her heart rate. No chest pain or shortness of breath.  Inpatient Medications    Scheduled Meds:  fluticasone  furoate-vilanterol  1 puff Inhalation Daily   insulin  aspart  0-9 Units Subcutaneous Q4H   Continuous Infusions:  sodium chloride  50 mL/hr at 05/16/24 1628   amiodarone 60 mg/hr (05/17/24 0550)   heparin  1,750 Units/hr (05/17/24 0752)   potassium chloride 10 mEq (05/17/24 0836)   PRN Meds: acetaminophen , hydrALAZINE , HYDROmorphone  (DILAUDID ) injection, ipratropium-albuterol , ondansetron  (ZOFRAN ) IV, oxyCODONE    Vital Signs    Vitals:   05/16/24 1954 05/17/24 0000 05/17/24 0400 05/17/24 0746  BP: (!) 113/94 131/75 117/78 115/80  Pulse: (!) 130 (!) 116 (!) 131 (!) 105  Resp: 20 19 19 16   Temp: 99 F (37.2 C) 98.1 F (36.7 C) 98.4 F (36.9 C) 98.3 F (36.8 C)  TempSrc: Oral Oral Oral Oral  SpO2: 92% 96% 94% 100%  Weight:      Height:        Intake/Output Summary (Last 24 hours) at 05/17/2024 0924 Last data filed at 05/17/2024 0752 Gross per 24 hour  Intake --  Output 750 ml  Net -750 ml      05/14/2024    7:21 PM 05/10/2024    7:09 AM 05/06/2024    1:21 PM  Last 3 Weights  Weight (lbs) 233 lb 11 oz 233 lb 11 oz 233 lb 11 oz  Weight (kg) 106 kg 106 kg 106 kg      Telemetry    Atrial flutter, rates largely ~100 bpm - Personally Reviewed  Physical Exam   GEN: No acute distress.   Neck: No JVD Cardiac: largely regular, no murmurs, rubs, or gallops.  Respiratory: Clear to auscultation bilaterally. GI: Soft, nontender, non-distended  MS: No edema; No deformity. Neuro:  Nonfocal  Psych: Normal affect   New pertinent results (labs, ECG, imaging, cardiac studies)     Hgb down to 7.4 from 8.1  Cr improving to 1.03 K 3.2 Albumin 2.0  Assessment & Plan    Atrial flutter with rapid ventricular response Paroxysmal atrial fibrillation SBO, strict NPO - she is NPO, continue with IV heparin  (discussing given anemia) and IV amiodarone at this time -rates improved on IV amiodarone, now around 100 bpm - blood pressure stable but still too low for IV diltiazem  - She is currently n.p.o. but could work to add fractionated metoprolol  once able to take oral medication. She is doing sips of water, so hopefully would be able to do this soon - ultimately she may require TEE-cardioversion to manage the flutter, but this would have to be significantly far in the future given her SBO/nausea. Alternative is to try to rate control and have her on anticoagulation for three weeks, then bring her back as an outpatient for cardioversion. Biggest factor will be whether we can rate control the flutter on oral meds.  Anemia -Hgb dropped this AM. Will discuss with team if we need to hold heparin   Acute kidney injury -Cr improving  Hypokalemia -would replete, aim to keep K>4. Has IV ordered (30 meq), may need additional doses    Signed, Shelda Lonni, MD  05/17/2024, 9:24 AM

## 2024-05-17 NOTE — Progress Notes (Addendum)
 PROGRESS NOTE        PATIENT DETAILS Name: Rhonda Hickman Age: 68 y.o. Sex: female Date of Birth: Aug 24, 1955 Admit Date: 05/14/2024 Admitting Physician Terry LOISE Hurst, DO ERE:[email or web address], Aureliano SAUNDERS, MD  Brief Summary: Patient is a 68 y.o.  female with history of PAF on Eliquis-who recently underwent right knee replacement on 10/31-subsequently discharged to SNF on 11/3-brought to the ED on 11/4 for altered mental status, A-fib RVR-found to have SBO.  Significant events: 11/4>> admit to [facility]  Significant studies: 11/4>> CT head: No acute abnormality 11/5>> CT chest/abdomen/pelvis: Findings suspicious for distal SBO.  Moderate stool burden.  Significant microbiology data: 11/4>> blood culture: No growth  Procedures: None  Consults: General Surgery Cardiology  Subjective: Completely awake and alert-has had numerous BMs overnight.  Objective: Vitals: Blood pressure 121/65, pulse (!) 114, temperature 98.3 F (36.8 C), temperature source Oral, resp. rate 15, height 6' (1.829 m), weight 106 kg, SpO2 99%.   Exam: Gen Exam:Alert awake-not in any distress HEENT:atraumatic, normocephalic Chest: B/L clear to auscultation anteriorly CVS:S1S2 regular Abdomen:soft non tender, non distended Extremities:no edema Neurology: Non focal Skin: no rash  Pertinent Labs/Radiology:    Latest Ref Rng & Units 05/17/2024    5:56 AM 05/16/2024    3:34 AM 05/15/2024   12:15 PM  CBC  WBC 4.0 - 10.5 K/uL 10.5  9.5  11.1   Hemoglobin 12.0 - 15.0 g/dL 7.4  8.1  8.2   Hematocrit 36.0 - 46.0 % 23.5  25.0  25.1   Platelets 150 - 400 K/uL 348  355  337     Lab Results  Component Value Date   NA 138 05/17/2024   K 3.2 (L) 05/17/2024   CL 106 05/17/2024   CO2 19 (L) 05/17/2024      Assessment/Plan: SBO Improved No further vomiting-in fact has had numerous BMs overnight Clear liquids being started by general surgery Follow  PAF with RVR Rate better  controlled Oral metoprolol  being started today by cardiology Continue amiodarone and IV heparin  infusion Continue telemetry monitoring.    Acute toxic metabolic encephalopathy Suspect this is from initially Depakote  use and then Compazine  use Both agents have been discontinued She is completely awake and alert this morning. Minimize narcotics/sedating agents as much as possible  Lactic acidosis Probably secondary to dehydration from vomiting No evidence of PNA on CT imaging-no longer on Zosyn.  Chronic HFpEF Euvolemic Diuretics as needed  AKI on CKD stage II AKI hemodynamically mediated-improving with supportive care  Hypokalemia Replete/recheck  Microcytic anemia Appears to have microcytic anemia at baseline (probable iron deficiency-on iron supplementation at home)-hemoglobin worse due to acute illness/perhaps perioperative blood loss from recent knee surgery.  No overt GI bleeding noted. Follow CBC  DM-2 CBG stable SSI  Recent Labs    05/16/24 2334 05/17/24 0346 05/17/24 0815  GLUCAP 150* 167* 165*     HLD Resume statin in the next day or so-now that oral intake is being slowly resumed.  COPD Not in exacerbation Continue bronchodilators  Mood disorder Somewhat somnolent but improving this morning Apparently not on scheduled Depakote  at home although listed differently on MAR Wellbutrin  on hold-resume in the next day or so if tolerating oral intake.  Recent right knee replacement on 10/31 (Dr. Vernetta) PT/OT Already on anticoagulation for A-fib Minimal narcotics if possible.  Class 1 Obesity: Estimated body mass  index is 31.69 kg/m as calculated from the following:   Height as of this encounter: 6' (1.829 m).   Weight as of this encounter: 106 kg.   Code status:   Code Status: Full Code   DVT Prophylaxis:IV heparin    Family Communication: Sister at bedside on 11/6-none at bedside this morning.   Disposition Plan: Status is:  Inpatient Remains inpatient appropriate because: Severity of illness   Planned Discharge Destination:Skilled nursing facility   Diet: Diet Order             Diet clear liquid Room service appropriate? Yes; Fluid consistency: Thin  Diet effective now                     Antimicrobial agents: Anti-infectives (From admission, onward)    Start     Dose/Rate Route Frequency Ordered Stop   05/15/24 0600  piperacillin-tazobactam (ZOSYN) IVPB 3.375 g  Status:  Discontinued        3.375 g 12.5 mL/hr over 240 Minutes Intravenous Every 8 hours 05/15/24 0031 05/16/24 1025   05/14/24 2145  cefTRIAXone  (ROCEPHIN ) 2 g in sodium chloride  0.9 % 100 mL IVPB        2 g 200 mL/hr over 30 Minutes Intravenous  Once 05/14/24 2131 05/14/24 2357   05/14/24 2145  azithromycin  (ZITHROMAX ) 500 mg in sodium chloride  0.9 % 250 mL IVPB        500 mg 250 mL/hr over 60 Minutes Intravenous  Once 05/14/24 2131 05/15/24 0116        MEDICATIONS: Scheduled Meds:  fluticasone  furoate-vilanterol  1 puff Inhalation Daily   insulin  aspart  0-9 Units Subcutaneous Q4H   metoprolol  tartrate  12.5 mg Oral Q6H   Continuous Infusions:  sodium chloride  50 mL/hr at 05/16/24 1628   amiodarone 60 mg/hr (05/17/24 1151)   heparin  1,850 Units/hr (05/17/24 0932)   potassium chloride 10 mEq (05/17/24 1154)   PRN Meds:.acetaminophen , hydrALAZINE , HYDROmorphone  (DILAUDID ) injection, ipratropium-albuterol , ondansetron  (ZOFRAN ) IV, oxyCODONE    I have personally reviewed following labs and imaging studies  LABORATORY DATA: CBC: Recent Labs  Lab 05/14/24 1940 05/14/24 1953 05/15/24 0429 05/15/24 1215 05/16/24 0334 05/17/24 0556  WBC 13.7*  --  8.8 11.1* 9.5 10.5  NEUTROABS 9.1*  --  5.3  --  6.2  --   HGB 9.6* 10.2* 7.6* 8.2* 8.1* 7.4*  HCT 29.8* 30.0* 23.9* 25.1* 25.0* 23.5*  MCV 80.5  --  81.0 79.9* 79.4* 81.0  PLT 334  --  283 337 355 348    Basic Metabolic Panel: Recent Labs  Lab 05/14/24 1940  05/14/24 1953 05/15/24 0429 05/15/24 1215 05/16/24 0334 05/17/24 0556  NA 132* 135 129* 131* 137 138  K 5.9* 5.7* 4.3 4.3 3.6 3.2*  CL 96*  --  92* 98 103 106  CO2 20*  --  20* 18* 19* 19*  GLUCOSE 110*  --  470* 251* 219* 167*  BUN 64*  --  46* 46* 36* 18  CREATININE 1.64*  --  1.30* 1.37* 1.26* 1.03*  CALCIUM  8.2*  --  7.5* 7.9* 8.3* 7.7*  MG  --   --  2.3  --   --   --   PHOS  --   --  3.7  --   --   --     GFR: Estimated Creatinine Clearance: 72.2 mL/min (A) (by C-G formula based on SCr of 1.03 mg/dL (H)).  Liver Function Tests: Recent Labs  Lab 05/14/24 1940 05/15/24  9570 05/17/24 0556  AST 34 19 14*  ALT 28 20 13   ALKPHOS 73 56 53  BILITOT 0.9 0.5 0.6  PROT 7.6 6.7 6.2*  ALBUMIN 2.2* 2.3* 2.0*   No results for input(s): LIPASE, AMYLASE in the last 168 hours. Recent Labs  Lab 05/14/24 1940  AMMONIA 28    Coagulation Profile: No results for input(s): INR, PROTIME in the last 168 hours.  Cardiac Enzymes: No results for input(s): CKTOTAL, CKMB, CKMBINDEX, TROPONINI in the last 168 hours.  BNP (last 3 results) No results for input(s): PROBNP in the last 8760 hours.  Lipid Profile: No results for input(s): CHOL, HDL, LDLCALC, TRIG, CHOLHDL, LDLDIRECT in the last 72 hours.  Thyroid  Function Tests: No results for input(s): TSH, T4TOTAL, FREET4, T3FREE, THYROIDAB in the last 72 hours.  Anemia Panel: Recent Labs    05/17/24 0556  VITAMINB12 378  FOLATE 5.2*  FERRITIN 332*  TIBC 195*  IRON 13*  RETICCTPCT 2.8    Urine analysis:    Component Value Date/Time   COLORURINE YELLOW 05/15/2024 1336   APPEARANCEUR CLEAR 05/15/2024 1336   LABSPEC 1.025 05/15/2024 1336   PHURINE 6.0 05/15/2024 1336   GLUCOSEU NEGATIVE 05/15/2024 1336   HGBUR MODERATE (A) 05/15/2024 1336   BILIRUBINUR NEGATIVE 05/15/2024 1336   BILIRUBINUR negative 12/08/2020 1009   BILIRUBINUR negative 05/04/2020 1349   KETONESUR NEGATIVE  05/15/2024 1336   PROTEINUR NEGATIVE 05/15/2024 1336   UROBILINOGEN 0.2 12/08/2020 1009   UROBILINOGEN 0.2 09/12/2013 1642   NITRITE POSITIVE (A) 05/15/2024 1336   LEUKOCYTESUR SMALL (A) 05/15/2024 1336    Sepsis Labs: Lactic Acid, Venous    Component Value Date/Time   LATICACIDVEN 1.5 05/16/2024 0334    MICROBIOLOGY: Recent Results (from the past 240 hours)  Blood Culture (routine x 2)     Status: None (Preliminary result)   Collection Time: 05/14/24  7:40 PM   Specimen: BLOOD LEFT ARM  Result Value Ref Range Status   Specimen Description BLOOD LEFT ARM  Final   Special Requests   Final    BOTTLES DRAWN AEROBIC AND ANAEROBIC Blood Culture adequate volume   Culture   Final    NO GROWTH 3 DAYS Performed at [facility], [address] [place], [place] [address]    Report Status PENDING  Incomplete  Blood Culture (routine x 2)     Status: None (Preliminary result)   Collection Time: 05/14/24  7:41 PM   Specimen: BLOOD RIGHT ARM  Result Value Ref Range Status   Specimen Description BLOOD RIGHT ARM  Final   Special Requests   Final    BOTTLES DRAWN AEROBIC AND ANAEROBIC Blood Culture adequate volume   Culture   Final    NO GROWTH 3 DAYS Performed at [facility], [address] [place], [place] [address]    Report Status PENDING  Incomplete    RADIOLOGY STUDIES/RESULTS: DG Abd Portable 1V Result Date: 05/16/2024 EXAM: 1 VIEW XRAY OF THE ABDOMEN 05/16/2024 08:21:00 AM COMPARISON: 05/15/2024 CLINICAL HISTORY: SBO (small bowel obstruction) (HCC) FINDINGS: BOWEL: Dilute contrast material opacifies the small bowel loops. Diminished small bowel gas limits assessment for bowel obstruction. However, there are continued mildly dilated loops of small bowel in the right lower quadrant of the abdomen measuring up to 4.1 cm. Gas and stool noted within the colon up to the rectum. SOFT TISSUES: Cholecystectomy clips noted. No opaque urinary calculi. BONES: No acute osseous  abnormality. IMPRESSION: 1. Continued mildly dilated small-bowel loops  in the right lower quadrant measuring up to 4.1 cm, which may reflect ongoing ileus or partial small-bowel obstruction. Electronically signed by: Waddell Calk MD 05/16/2024 08:32 AM EST RP Workstation: [id number]   DG Abd Portable 1V-Small Bowel Obstruction Protocol-initial, 8 hr delay Result Date: 05/15/2024 CLINICAL DATA:  Small-bowel obstruction EXAM: PORTABLE ABDOMEN - 1 VIEW COMPARISON:  05/15/2024 FINDINGS: Three supine frontal views of the abdomen and pelvis are obtained. Enteric catheter has been removed. The previously administered oral contrast remains within the stomach. Slow diffusion of oral contrast into the proximal small bowel. No contrast has reached the colon by the time of imaging. Continued distended gas-filled loops of small bowel are seen within the right hemiabdomen. IMPRESSION: 1. Continued findings of small-bowel obstruction. The previously administered oral contrast has not yet reached the colon by the time of imaging. 2. Interval removal of the enteric catheter. Electronically Signed   By: Ozell Daring M.D.   On: 05/15/2024 14:56     LOS: 3 days   Donalda Applebaum, MD  [facility]    To contact the attending provider between 7A-7P or the covering provider during after hours 7P-7A, please log into the web site [email or web address] and access using universal [place] password for that web site. If you do not have the password, please call the hospital operator.  05/17/2024, 11:59 AM

## 2024-05-17 NOTE — TOC Progression Note (Signed)
 Transition of Care [facility]) - Progression Note    Patient Details  Name: Rhonda Hickman MRN: [id number] Date of Birth: 1955-08-18  Transition of [facility]) CM/SW Contact  [name], LCSW Phone Number: 05/17/2024, 4:53 PM  Clinical Narrative:    CSW updated [facility] that patient may be discharged over the weekend and started insurance authorization, Ref# [id number]. Updated MD that [facility] expressed concern over patient being ready too quickly.    Expected Discharge Plan: Skilled Nursing Facility Barriers to Discharge: Continued Medical Work up, English As A Second Language Teacher               Expected Discharge Plan and Services In-house Referral: Clinical Social Work   Post Acute Care Choice: Skilled Nursing Facility Living arrangements for the past 2 months: Apartment                                       Social Drivers of Health (SDOH) Interventions SDOH Screenings   Food Insecurity: No Food Insecurity (05/10/2024)  Housing: Low Risk  (05/10/2024)  Transportation Needs: No Transportation Needs (05/10/2024)  Utilities: Not At Risk (05/10/2024)  Depression (PHQ2-9): Low Risk  (03/29/2024)  Social Connections: Patient Unable To Answer (05/10/2024)  Tobacco Use: High Risk (05/10/2024)    Readmission Risk Interventions     No data to display

## 2024-05-17 NOTE — Progress Notes (Signed)
 Patient ID: Rhonda Hickman, female   DOB: 29-May-1956, 68 y.o.   MRN: [id number]      Subjective: Had 3 total BMs No abdominal pain  ROS negative except as listed above. Objective: Vital signs in last 24 hours: Temp:  [98.1 F (36.7 C)-99 F (37.2 C)] 98.3 F (36.8 C) (11/07 0746) Pulse Rate:  [105-132] 105 (11/07 0746) Resp:  [14-20] 16 (11/07 0746) BP: (103-131)/(72-94) 115/80 (11/07 0746) SpO2:  [92 %-100 %] 100 % (11/07 0746) Last BM Date : 05/17/24  Intake/Output from previous day: 11/06 0701 - 11/07 0700 In: -  Out: 300 [Urine:300] Intake/Output this shift: Total I/O In: -  Out: 750 [Urine:750]  General appearance: alert and cooperative GI: soft, NT, ND  Lab Results: CBC  Recent Labs    05/16/24 0334 05/17/24 0556  WBC 9.5 10.5  HGB 8.1* 7.4*  HCT 25.0* 23.5*  PLT 355 348   BMET Recent Labs    05/16/24 0334 05/17/24 0556  NA 137 138  K 3.6 3.2*  CL 103 106  CO2 19* 19*  GLUCOSE 219* 167*  BUN 36* 18  CREATININE 1.26* 1.03*  CALCIUM  8.3* 7.7*   PT/INR No results for input(s): LABPROT, INR in the last 72 hours. ABG Recent Labs    05/14/24 1953  HCO3 23.2    Studies/Results: DG Abd Portable 1V Result Date: 05/16/2024 EXAM: 1 VIEW XRAY OF THE ABDOMEN 05/16/2024 08:21:00 AM COMPARISON: 05/15/2024 CLINICAL HISTORY: SBO (small bowel obstruction) (HCC) FINDINGS: BOWEL: Dilute contrast material opacifies the small bowel loops. Diminished small bowel gas limits assessment for bowel obstruction. However, there are continued mildly dilated loops of small bowel in the right lower quadrant of the abdomen measuring up to 4.1 cm. Gas and stool noted within the colon up to the rectum. SOFT TISSUES: Cholecystectomy clips noted. No opaque urinary calculi. BONES: No acute osseous abnormality. IMPRESSION: 1. Continued mildly dilated small-bowel loops in the right lower quadrant measuring up to 4.1 cm, which may reflect ongoing ileus or partial small-bowel  obstruction. Electronically signed by: Waddell Calk MD 05/16/2024 08:32 AM EST RP Workstation: [id number]   DG Abd Portable 1V-Small Bowel Obstruction Protocol-initial, 8 hr delay Result Date: 05/15/2024 CLINICAL DATA:  Small-bowel obstruction EXAM: PORTABLE ABDOMEN - 1 VIEW COMPARISON:  05/15/2024 FINDINGS: Three supine frontal views of the abdomen and pelvis are obtained. Enteric catheter has been removed. The previously administered oral contrast remains within the stomach. Slow diffusion of oral contrast into the proximal small bowel. No contrast has reached the colon by the time of imaging. Continued distended gas-filled loops of small bowel are seen within the right hemiabdomen. IMPRESSION: 1. Continued findings of small-bowel obstruction. The previously administered oral contrast has not yet reached the colon by the time of imaging. 2. Interval removal of the enteric catheter. Electronically Signed   By: Ozell Daring M.D.   On: 05/15/2024 14:56   DG Abd Portable 1 View Result Date: 05/15/2024 CLINICAL DATA:  NG tube placement EXAM: PORTABLE ABDOMEN - 1 VIEW COMPARISON:  05/15/2024 FINDINGS: Similarly positioned esophagogastric tube, with the tip obscured by enteric contrast in the stomach. The last side hole remains at the level of the GE junction. Similar gaseous distention of the small bowel in the upper abdomen.No pneumoperitoneum. Cholecystectomy clips. No acute fracture or destructive lesion. IMPRESSION: 1. Similarly positioned esophagogastric tube, with the tip obscured by enteric contrast in the stomach. 2. Redemonstrated gas-filled and dilated small bowel in the upper abdomen. Electronically Signed   By: Rogelia  Carlean M.D.   On: 05/15/2024 09:02    Anti-infectives: Anti-infectives (From admission, onward)    Start     Dose/Rate Route Frequency Ordered Stop   05/15/24 0600  piperacillin-tazobactam (ZOSYN) IVPB 3.375 g  Status:  Discontinued        3.375 g 12.5 mL/hr over 240 Minutes  Intravenous Every 8 hours 05/15/24 0031 05/16/24 1025   05/14/24 2145  cefTRIAXone  (ROCEPHIN ) 2 g in sodium chloride  0.9 % 100 mL IVPB        2 g 200 mL/hr over 30 Minutes Intravenous  Once 05/14/24 2131 05/14/24 2357   05/14/24 2145  azithromycin  (ZITHROMAX ) 500 mg in sodium chloride  0.9 % 250 mL IVPB        500 mg 250 mL/hr over 60 Minutes Intravenous  Once 05/14/24 2131 05/15/24 0116       Assessment/Plan: SBO -clinically resolved -clears now -likely can advance diet later today if she does well -I D/W Dr. Raenelle  FEN - clears VTE - heparin  ID - zosyn for HCAP  HCAP A fib with RVR Bipolar disorder COPD CVA CAD DM GERD   LOS: 3 days    Dann Hummer, MD, MPH, FACS Trauma & General Surgery Use [email or web address] to contact on call provider  05/17/2024

## 2024-05-17 NOTE — Progress Notes (Signed)
 PHARMACY - ANTICOAGULATION CONSULT NOTE  Pharmacy Consult for Heparin  (Xarelto  on hold) Indication: atrial fibrillation  Allergies  Allergen Reactions   Aspirin  Nausea Only and Other (See Comments)    Causes stomach pain    Patient Measurements: Height: 6' (182.9 cm) Weight: 106 kg (233 lb 11 oz) IBW/kg (Calculated) : 73.1 HEPARIN  DW (KG): 95.8  Vital Signs: Temp: 98.3 F (36.8 C) (11/07 0746) Temp Source: Oral (11/07 0746) BP: 115/80 (11/07 0746) Pulse Rate: 105 (11/07 0746)  Labs: Recent Labs    05/15/24 1215 05/16/24 0334 05/16/24 2013 05/17/24 0556  HGB 8.2* 8.1*  --  7.4*  HCT 25.1* 25.0*  --  23.5*  PLT 337 355  --  348  APTT  --  62* 51* 55*  HEPARINUNFRC  --  0.47 0.32 0.30  CREATININE 1.37* 1.26*  --  1.03*    Estimated Creatinine Clearance: 72.2 mL/min (A) (by C-G formula based on SCr of 1.03 mg/dL (H)).  Assessment: 68 yo F presented with AMS after recent discharge on 11/4 s/p right knee replacement. Pharmacy consulted to dose heparin  until patient able to take Xarelto  for afib. Per facility did not receive evening dose of Xarelto , last dose documented 11/3 at [facility]   aPTT remains low at 55 seconds and heparin  level therapeutic at 0.30 on 1750 units/hr. Not yet correlating. Infusion rate increased last night. Hemoglobin trended down some. Platelet count stable. No infusion issues or bleeding per RN.  Goal of Therapy:  Heparin  level 0.3-0.7 units/ml aPTT 66-102 seconds Monitor platelets by anticoagulation protocol: Yes   Plan:  Increase heparin  drip to 1850 units/hr aPTT and heparin  level ~8 hrs after rate change. Daily aPTT and heparin  level until correlating; daily CBC. Follow up for change back to Xarelto  when able.  Genaro Zebedee Calin, RPh 05/17/2024,9:05 AM

## 2024-05-18 DIAGNOSIS — I4892 Unspecified atrial flutter: Secondary | ICD-10-CM | POA: Diagnosis not present

## 2024-05-18 DIAGNOSIS — D649 Anemia, unspecified: Secondary | ICD-10-CM | POA: Diagnosis not present

## 2024-05-18 DIAGNOSIS — I483 Typical atrial flutter: Secondary | ICD-10-CM | POA: Diagnosis not present

## 2024-05-18 DIAGNOSIS — K56609 Unspecified intestinal obstruction, unspecified as to partial versus complete obstruction: Secondary | ICD-10-CM | POA: Diagnosis not present

## 2024-05-18 LAB — MAGNESIUM: Magnesium: 1.9 mg/dL (ref 1.7–2.4)

## 2024-05-18 LAB — BASIC METABOLIC PANEL WITH GFR
Anion gap: 10 (ref 5–15)
BUN: 9 mg/dL (ref 8–23)
CO2: 22 mmol/L (ref 22–32)
Calcium: 7.9 mg/dL — ABNORMAL LOW (ref 8.9–10.3)
Chloride: 103 mmol/L (ref 98–111)
Creatinine, Ser: 0.87 mg/dL (ref 0.44–1.00)
GFR, Estimated: >60 mL/min (ref >60)
Glucose, Bld: 153 mg/dL — ABNORMAL HIGH (ref 70–99)
Potassium: 3 mmol/L — ABNORMAL LOW (ref 3.5–5.1)
Sodium: 135 mmol/L (ref 135–145)

## 2024-05-18 LAB — CBC
HCT: 20.3 % — ABNORMAL LOW (ref 36.0–46.0)
HCT: 23.1 % — ABNORMAL LOW (ref 36.0–46.0)
Hemoglobin: 6.9 g/dL — CL (ref 12.0–15.0)
Hemoglobin: 7.8 g/dL — ABNORMAL LOW (ref 12.0–15.0)
MCH: 26.5 pg (ref 26.0–34.0)
MCH: 26.5 pg (ref 26.0–34.0)
MCHC: 34 g/dL (ref 30.0–36.0)
MCHC: 33.8 g/dL (ref 30.0–36.0)
MCV: 78.1 fL — ABNORMAL LOW (ref 80.0–100.0)
MCV: 78.6 fL — ABNORMAL LOW (ref 80.0–100.0)
Platelets: 346 K/uL (ref 150–400)
Platelets: 345 K/uL (ref 150–400)
RBC: 2.6 MIL/uL — ABNORMAL LOW (ref 3.87–5.11)
RBC: 2.94 MIL/uL — ABNORMAL LOW (ref 3.87–5.11)
RDW: 18.7 % — ABNORMAL HIGH (ref 11.5–15.5)
RDW: 18 % — ABNORMAL HIGH (ref 11.5–15.5)
WBC: 14.4 K/uL — ABNORMAL HIGH (ref 4.0–10.5)
WBC: 14.2 K/uL — ABNORMAL HIGH (ref 4.0–10.5)
nRBC: 0.9 % — ABNORMAL HIGH (ref 0.0–0.2)
nRBC: 1 % — ABNORMAL HIGH (ref 0.0–0.2)

## 2024-05-18 LAB — GLUCOSE, CAPILLARY
Glucose-Capillary: 149 mg/dL — ABNORMAL HIGH (ref 70–99)
Glucose-Capillary: 218 mg/dL — ABNORMAL HIGH (ref 70–99)
Glucose-Capillary: 196 mg/dL — ABNORMAL HIGH (ref 70–99)
Glucose-Capillary: 224 mg/dL — ABNORMAL HIGH (ref 70–99)
Glucose-Capillary: 213 mg/dL — ABNORMAL HIGH (ref 70–99)

## 2024-05-18 LAB — APTT: aPTT: 72 s — ABNORMAL HIGH (ref 24–36)

## 2024-05-18 LAB — HEPARIN LEVEL (UNFRACTIONATED): Heparin Unfractionated: 0.4 [IU]/mL (ref 0.30–0.70)

## 2024-05-18 LAB — PREPARE RBC (CROSSMATCH)

## 2024-05-18 MED ORDER — SODIUM CHLORIDE 0.9% IV SOLUTION: Freq: Once | INTRAVENOUS | Status: AC

## 2024-05-18 MED ORDER — MAGNESIUM SULFATE 2 GM/50ML IV SOLN
2.0000 g | Freq: Once | INTRAVENOUS | Status: AC
  Administered 2024-05-18: 2 g via INTRAVENOUS
  Filled 2024-05-18: qty 50

## 2024-05-18 MED ORDER — ROSUVASTATIN CALCIUM 5 MG PO TABS
10.0000 mg | ORAL_TABLET | Freq: Every day | ORAL | Status: DC
  Administered 2024-05-18 – 2024-05-22 (×5): 10 mg via ORAL
  Filled 2024-05-18 (×5): qty 2

## 2024-05-18 MED ORDER — OXYCODONE HCL 5 MG PO TABS
5.0000 mg | ORAL_TABLET | Freq: Four times a day (QID) | ORAL | Status: DC | PRN
  Administered 2024-05-18 – 2024-05-22 (×10): 5 mg via ORAL
  Filled 2024-05-18 (×13): qty 1

## 2024-05-18 MED ORDER — AMIODARONE HCL 200 MG PO TABS
200.0000 mg | ORAL_TABLET | Freq: Two times a day (BID) | ORAL | Status: DC
  Administered 2024-05-18 – 2024-05-22 (×9): 200 mg via ORAL
  Filled 2024-05-18 (×9): qty 1

## 2024-05-18 MED ORDER — POTASSIUM CHLORIDE CRYS ER 20 MEQ PO TBCR
40.0000 meq | EXTENDED_RELEASE_TABLET | ORAL | Status: AC
  Administered 2024-05-18 (×2): 40 meq via ORAL
  Filled 2024-05-18 (×2): qty 2

## 2024-05-18 NOTE — Progress Notes (Signed)
 PHARMACY - ANTICOAGULATION CONSULT NOTE  Pharmacy Consult for Heparin  (Xarelto  on hold) Indication: atrial fibrillation  Allergies  Allergen Reactions   Aspirin  Nausea Only and Other (See Comments)    Causes stomach pain    Patient Measurements: Height: 6' (182.9 cm) Weight: 106 kg (233 lb 11 oz) IBW/kg (Calculated) : 73.1 HEPARIN  DW (KG): 95.8  Vital Signs: Temp: 98.4 F (36.9 C) (11/08 0400) Temp Source: Oral (11/08 0400) BP: 123/61 (11/08 0400) Pulse Rate: 61 (11/08 0400)  Labs: Recent Labs    05/16/24 0334 05/16/24 2013 05/17/24 0556 05/17/24 1759 05/18/24 0416  HGB 8.1*  --  7.4*  --  6.9*  HCT 25.0*  --  23.5*  --  20.3*  PLT 355  --  348  --  346  APTT 62*   < > 55* 76* 72*  HEPARINUNFRC 0.47   < > 0.30 0.35 0.40  CREATININE 1.26*  --  1.03*  --  0.87   < > = values in this interval not displayed.    Estimated Creatinine Clearance: 85.5 mL/min (by C-G formula based on SCr of 0.87 mg/dL).  Assessment: 68 yo F presented with AMS after recent discharge on 11/4 s/p right knee replacement. Pharmacy consulted to dose heparin  until patient able to take Xarelto  for afib. Per facility did not receive evening dose of Xarelto , last dose documented 11/3 at [facility]   aPTT and heparin  level now both at therapeutic goal range on 1950 units/hr.  Given slow drop in hemoglbin will decrease dose slightly to be at the low end of therapeutic, Last dose of Xarelto  5d ago so levels are correlating will start monitoring heparin  levels only.    Goal of Therapy:  Heparin  level 0.3-0.7 units/ml aPTT 66-102 seconds Monitor platelets by anticoagulation protocol: Yes   Plan:  Decrease heparin  drip to 1900 units/hr Daily heparin  level with AM labs; daily CBC. Follow up for change back to Xarelto  when able.  Donny Alert, PharmD, [facility] Clinical Pharmacist Please see AMION for all Pharmacists' Contact Phone Numbers 05/18/2024, 6:59 AM

## 2024-05-18 NOTE — Progress Notes (Signed)
 Rounding Note   Patient Name: Rhonda Hickman Date of Encounter: 05/18/2024  [facility] Cardiologist: Lonni LITTIE [name], MD   Subjective No complaints  Scheduled Meds:  sodium chloride    Intravenous Once   fluticasone  furoate-vilanterol  1 puff Inhalation Daily   insulin  aspart  0-9 Units Subcutaneous Q4H   metoprolol  tartrate  12.5 mg Oral Q6H   potassium chloride  40 mEq Oral Q4H   Continuous Infusions:  amiodarone 60 mg/hr (05/18/24 0520)   heparin  1,900 Units/hr (05/18/24 0714)   PRN Meds: acetaminophen , hydrALAZINE , ipratropium-albuterol , ondansetron  (ZOFRAN ) IV, oxyCODONE    Vital Signs  Vitals:   05/18/24 0320 05/18/24 0400 05/18/24 0804 05/18/24 0811  BP: 107/61 123/61 (!) 104/56   Pulse: 72 61    Resp:  12    Temp:  98.4 F (36.9 C) 98.5 F (36.9 C)   TempSrc:  Oral Oral   SpO2:  97%  96%  Weight:      Height:        Intake/Output Summary (Last 24 hours) at 05/18/2024 0952 Last data filed at 05/18/2024 0334 Gross per 24 hour  Intake 120 ml  Output 700 ml  Net -580 ml      05/14/2024    7:21 PM 05/10/2024    7:09 AM 05/06/2024    1:21 PM  Last 3 Weights  Weight (lbs) 233 lb 11 oz 233 lb 11 oz 233 lb 11 oz  Weight (kg) 106 kg 106 kg 106 kg      Telemetry NSR - Personally Reviewed  ECG  N/a - Personally Reviewed  Physical Exam  GEN: No acute distress.   Neck: No JVD Cardiac: RRR, no murmurs, rubs, or gallops.  Respiratory: Clear to auscultation bilaterally. GI: Soft, nontender, non-distended  MS: No edema; No deformity. Neuro:  Nonfocal  Psych: Normal affect   Labs High Sensitivity Troponin:  No results for input(s): TROPONINIHS in the last 720 hours.   Chemistry Recent Labs  Lab 05/14/24 1940 05/14/24 1953 05/15/24 0429 05/15/24 1215 05/16/24 0334 05/17/24 0556 05/18/24 0416  NA 132*   < > 129*   < > 137 138 135  K 5.9*   < > 4.3   < > 3.6 3.2* 3.0*  CL 96*  --  92*   < > 103 106 103  CO2 20*  --  20*   < >  19* 19* 22  GLUCOSE 110*  --  470*   < > 219* 167* 153*  BUN 64*  --  46*   < > 36* 18 9  CREATININE 1.64*  --  1.30*   < > 1.26* 1.03* 0.87  CALCIUM  8.2*  --  7.5*   < > 8.3* 7.7* 7.9*  MG  --   --  2.3  --   --   --  1.9  PROT 7.6  --  6.7  --   --  6.2*  --   ALBUMIN 2.2*  --  2.3*  --   --  2.0*  --   AST 34  --  19  --   --  14*  --   ALT 28  --  20  --   --  13  --   ALKPHOS 73  --  56  --   --  53  --   BILITOT 0.9  --  0.5  --   --  0.6  --   GFRNONAA 34*  --  45*   < >  47* 60* >60  ANIONGAP 16*  --  17*   < > 15 13 10    < > = values in this interval not displayed.    Lipids No results for input(s): CHOL, TRIG, HDL, LABVLDL, LDLCALC, CHOLHDL in the last 168 hours.  Hematology Recent Labs  Lab 05/16/24 0334 05/17/24 0556 05/18/24 0416  WBC 9.5 10.5 14.4*  RBC 3.15* 2.90*  2.85* 2.60*  HGB 8.1* 7.4* 6.9*  HCT 25.0* 23.5* 20.3*  MCV 79.4* 81.0 78.1*  MCH 25.7* 25.5* 26.5  MCHC 32.4 31.5 34.0  RDW 18.3* 19.1* 18.7*  PLT 355 348 346   Thyroid  No results for input(s): TSH, FREET4 in the last 168 hours.  BNPNo results for input(s): BNP, PROBNP in the last 168 hours.  DDimer No results for input(s): DDIMER in the last 168 hours.   Radiology  No results found.     Assessment & Plan  1.PAF/aflutter with RVR - started on IV amio this admission, management complicated as she is NPO for SBO and also complicated by some prior low bp's - SBO has resolved. Started on oral lopressor  12.5mg  every 6 hours, on amio gtt at 60mg /hr   -patient converted yesterday (05/17/24) to NSR on IV amio at 642pm - transition IV amio to oral 200mg  bid x 3 weeks then 200mg  daily - can continue lopressor  12.5mg  every 6 hours, consolidate dosing pending rates and bp's today.  - with further drop in Hgb stop heparin , will not start DOAC yet.    2.SBO - from surge[facility] notes this AM clinically resolved, starting regular diet   3.Anemia - down to 6.9 this AM - stop  heparin  for now, ongoing management per primary team  For questions or updates, please contact [facility] Please consult [email or web address] for contact info under       Signed, Alvan Carrier, MD  05/18/2024, 9:52 AM

## 2024-05-18 NOTE — TOC Progression Note (Signed)
 Transition of Care [facility]) - Progression Note    Patient Details  Name: Rhonda Hickman MRN: [id number] Date of Birth: 1956-04-01  Transition of Care [facility] CM/SW Contact  Dino CHRISTELLA Au, LCSWA Phone Number: 05/18/2024, 9:20 AM  Clinical Narrative:     SW spoke with Ronal [facility] [phone]) shara still pending. Has not been reviewed at this time.   Expected Discharge Plan: Skilled Nursing Facility Barriers to Discharge: Continued Medical Work up, English As A Second Language Teacher       Expected Discharge Plan and Services In-house Referral: Clinical Social Work   Post Acute Care Choice: Skilled Nursing Facility Living arrangements for the past 2 months: Apartment                                       Social Drivers of Health (SDOH) Interventions SDOH Screenings   Food Insecurity: No Food Insecurity (05/10/2024)  Housing: Low Risk  (05/10/2024)  Transportation Needs: No Transportation Needs (05/10/2024)  Utilities: Not At Risk (05/10/2024)  Depression (PHQ2-9): Low Risk  (03/29/2024)  Social Connections: Patient Unable To Answer (05/10/2024)  Tobacco Use: High Risk (05/10/2024)    Readmission Risk Interventions     No data to display

## 2024-05-18 NOTE — Progress Notes (Signed)
 Patient ID: Rhonda Hickman, female   DOB: 1956/03/11, 68 y.o.   MRN: [id number]      Subjective: Tolerated clears, +BM ROS negative except as listed above. Objective: Vital signs in last 24 hours: Temp:  [97.9 F (36.6 C)-99 F (37.2 C)] 98.5 F (36.9 C) (11/08 0804) Pulse Rate:  [61-114] 61 (11/08 0400) Resp:  [12-20] 12 (11/08 0400) BP: (104-123)/(56-96) 104/56 (11/08 0804) SpO2:  [96 %-99 %] 96 % (11/08 0811) Last BM Date : 05/17/24  Intake/Output from previous day: 11/07 0701 - 11/08 0700 In: 120 [P.O.:120] Out: 1450 [Urine:1450] Intake/Output this shift: No intake/output data recorded.  General appearance: alert and cooperative Resp: few rhonchi GI: soft, NT, ND  Lab Results: CBC  Recent Labs    05/17/24 0556 05/18/24 0416  WBC 10.5 14.4*  HGB 7.4* 6.9*  HCT 23.5* 20.3*  PLT 348 346   BMET Recent Labs    05/17/24 0556 05/18/24 0416  NA 138 135  K 3.2* 3.0*  CL 106 103  CO2 19* 22  GLUCOSE 167* 153*  BUN 18 9  CREATININE 1.03* 0.87  CALCIUM  7.7* 7.9*   PT/INR No results for input(s): LABPROT, INR in the last 72 hours. ABG No results for input(s): PHART, HCO3 in the last 72 hours.  Invalid input(s): PCO2, PO2  Studies/Results: No results found.  Anti-infectives: Anti-infectives (From admission, onward)    Start     Dose/Rate Route Frequency Ordered Stop   05/15/24 0600  piperacillin-tazobactam (ZOSYN) IVPB 3.375 g  Status:  Discontinued        3.375 g 12.5 mL/hr over 240 Minutes Intravenous Every 8 hours 05/15/24 0031 05/16/24 1025   05/14/24 2145  cefTRIAXone  (ROCEPHIN ) 2 g in sodium chloride  0.9 % 100 mL IVPB        2 g 200 mL/hr over 30 Minutes Intravenous  Once 05/14/24 2131 05/14/24 2357   05/14/24 2145  azithromycin  (ZITHROMAX ) 500 mg in sodium chloride  0.9 % 250 mL IVPB        500 mg 250 mL/hr over 60 Minutes Intravenous  Once 05/14/24 2131 05/15/24 0116       Assessment/Plan: SBO -clinically resolved -reg  diet -I D/W Dr. Raenelle  FEN - reg VTE - heparin  ID - zosyn for HCAP  HCAP A fib with RVR Bipolar disorder COPD CVA CAD DM GERD    LOS: 4 days    Dann Hummer, MD, MPH, FACS Trauma & General Surgery Use [email or web address] to contact on call provider  05/18/2024

## 2024-05-18 NOTE — Progress Notes (Signed)
 PROGRESS NOTE        PATIENT DETAILS Name: Rhonda Hickman Age: 68 y.o. Sex: female Date of Birth: Jul 09, 1956 Admit Date: 05/14/2024 Admitting Physician Terry LOISE Hurst, DO ERE:[email or web address], Aureliano SAUNDERS, MD  Brief Summary: Patient is a 68 y.o.  female with history of PAF on Eliquis-who recently underwent right knee replacement on 10/31-subsequently discharged to SNF on 11/3-brought to the ED on 11/4 for altered mental status, A-fib RVR-found to have SBO.  Significant events: 11/4>> admit to [facility]  Significant studies: 11/4>> CT head: No acute abnormality 11/5>> CT chest/abdomen/pelvis: Findings suspicious for distal SBO.  Moderate stool burden.  Significant microbiology data: 11/4>> blood culture: No growth  Procedures: None  Consults: General Surgery Cardiology  Subjective: No major issues overnight-lying comfortably in bed-multiple BMs yesterday.  Tolerating clear liquids.  Completely awake/alert.  Objective: Vitals: Blood pressure (!) 130/58, pulse 63, temperature 98.5 F (36.9 C), temperature source Oral, resp. rate 17, height 6' (1.829 m), weight 106 kg, SpO2 98%.   Exam: Gen Exam:Alert awake-not in any distress HEENT:atraumatic, normocephalic Chest: B/L clear to auscultation anteriorly CVS:S1S2 regular Abdomen:soft non tender, non distended Extremities:no edema Neurology: Non focal Skin: no rash  Pertinent Labs/Radiology:    Latest Ref Rng & Units 05/18/2024    4:16 AM 05/17/2024    5:56 AM 05/16/2024    3:34 AM  CBC  WBC 4.0 - 10.5 K/uL 14.4  10.5  9.5   Hemoglobin 12.0 - 15.0 g/dL 6.9  7.4  8.1   Hematocrit 36.0 - 46.0 % 20.3  23.5  25.0   Platelets 150 - 400 K/uL 346  348  355     Lab Results  Component Value Date   NA 135 05/18/2024   K 3.0 (L) 05/18/2024   CL 103 05/18/2024   CO2 22 05/18/2024      Assessment/Plan: SBO Seems to have resolved Tolerating clears Diet being advanced per general surgery Follow  PAF with  RVR Back in sinus rhythm Amiodarone infusion being converted to oral amiodarone today On oral metoprolol  Drop in hemoglobin but no overt bleeding-cardiology has stopped IV heparin  for today Continue telemetry monitoring  Acute toxic metabolic encephalopathy Suspect this is from initially Depakote  use and then Compazine  use Both agents have been discontinued She is completely awake and alert this morning. Minimize narcotics/sedating agents as much as possible  Lactic acidosis Probably secondary to dehydration from vomiting No evidence of PNA on CT imaging-no longer on Zosyn.  Chronic HFpEF Euvolemic Diuretics as needed  AKI on CKD stage II AKI hemodynamically mediated-improving with supportive care  Hypokalemia Replete/recheck  Microcytic anemia Appears to have microcytic anemia at baseline (probable iron deficiency-on iron supplementation at home)-hemoglobin worse due to acute illness/perhaps perioperative blood loss from recent knee surgery-no overt GI bleeding noted-being transfused 1 unit of PRBC Follow CBC  DM-2 CBG stable SSI  Recent Labs    05/17/24 2326 05/18/24 0317 05/18/24 0748  GLUCAP 166* 149* 218*     HLD Resume statin  COPD Not in exacerbation Continue bronchodilators  Hypokalemia/hypomagnesemia Replete/recheck  Mood disorder Completely awake/alert-Will need to clarify with patient what medications she takes and resume accordingly.    Recent right knee replacement on 10/31 (Dr. Vernetta) PT/OT Already on anticoagulation for A-fib-held for worsening anemia. Minimal narcotics if possible.  Class 1 Obesity: Estimated body mass index is 31.69 kg/m as calculated  from the following:   Height as of this encounter: 6' (1.829 m).   Weight as of this encounter: 106 kg.   Code status:   Code Status: Full Code   DVT Prophylaxis:IV heparin    Family Communication: Sister at bedside on 11/6-none at bedside this morning.   Disposition  Plan: Status is: Inpatient Remains inpatient appropriate because: Severity of illness   Planned Discharge Destination:Skilled nursing facility   Diet: Diet Order             Diet Carb Modified Fluid consistency: Thin; Room service appropriate? Yes  Diet effective now                     Antimicrobial agents: Anti-infectives (From admission, onward)    Start     Dose/Rate Route Frequency Ordered Stop   05/15/24 0600  piperacillin-tazobactam (ZOSYN) IVPB 3.375 g  Status:  Discontinued        3.375 g 12.5 mL/hr over 240 Minutes Intravenous Every 8 hours 05/15/24 0031 05/16/24 1025   05/14/24 2145  cefTRIAXone  (ROCEPHIN ) 2 g in sodium chloride  0.9 % 100 mL IVPB        2 g 200 mL/hr over 30 Minutes Intravenous  Once 05/14/24 2131 05/14/24 2357   05/14/24 2145  azithromycin  (ZITHROMAX ) 500 mg in sodium chloride  0.9 % 250 mL IVPB        500 mg 250 mL/hr over 60 Minutes Intravenous  Once 05/14/24 2131 05/15/24 0116        MEDICATIONS: Scheduled Meds:  sodium chloride    Intravenous Once   amiodarone  200 mg Oral BID   fluticasone  furoate-vilanterol  1 puff Inhalation Daily   insulin  aspart  0-9 Units Subcutaneous Q4H   metoprolol  tartrate  12.5 mg Oral Q6H   potassium chloride  40 mEq Oral Q4H   Continuous Infusions:   PRN Meds:.acetaminophen , hydrALAZINE , ipratropium-albuterol , ondansetron  (ZOFRAN ) IV, oxyCODONE    I have personally reviewed following labs and imaging studies  LABORATORY DATA: CBC: Recent Labs  Lab 05/14/24 1940 05/14/24 1953 05/15/24 0429 05/15/24 1215 05/16/24 0334 05/17/24 0556 05/18/24 0416  WBC 13.7*  --  8.8 11.1* 9.5 10.5 14.4*  NEUTROABS 9.1*  --  5.3  --  6.2  --   --   HGB 9.6*   < > 7.6* 8.2* 8.1* 7.4* 6.9*  HCT 29.8*   < > 23.9* 25.1* 25.0* 23.5* 20.3*  MCV 80.5  --  81.0 79.9* 79.4* 81.0 78.1*  PLT 334  --  283 337 355 348 346   < > = values in this interval not displayed.    Basic Metabolic Panel: Recent Labs  Lab  05/15/24 0429 05/15/24 1215 05/16/24 0334 05/17/24 0556 05/18/24 0416  NA 129* 131* 137 138 135  K 4.3 4.3 3.6 3.2* 3.0*  CL 92* 98 103 106 103  CO2 20* 18* 19* 19* 22  GLUCOSE 470* 251* 219* 167* 153*  BUN 46* 46* 36* 18 9  CREATININE 1.30* 1.37* 1.26* 1.03* 0.87  CALCIUM  7.5* 7.9* 8.3* 7.7* 7.9*  MG 2.3  --   --   --  1.9  PHOS 3.7  --   --   --   --     GFR: Estimated Creatinine Clearance: 85.5 mL/min (by C-G formula based on SCr of 0.87 mg/dL).  Liver Function Tests: Recent Labs  Lab 05/14/24 1940 05/15/24 0429 05/17/24 0556  AST 34 19 14*  ALT 28 20 13   ALKPHOS 73 56 53  BILITOT 0.9 0.5 0.6  PROT 7.6 6.7 6.2*  ALBUMIN 2.2* 2.3* 2.0*   No results for input(s): LIPASE, AMYLASE in the last 168 hours. Recent Labs  Lab 05/14/24 1940  AMMONIA 28    Coagulation Profile: No results for input(s): INR, PROTIME in the last 168 hours.  Cardiac Enzymes: No results for input(s): CKTOTAL, CKMB, CKMBINDEX, TROPONINI in the last 168 hours.  BNP (last 3 results) No results for input(s): PROBNP in the last 8760 hours.  Lipid Profile: No results for input(s): CHOL, HDL, LDLCALC, TRIG, CHOLHDL, LDLDIRECT in the last 72 hours.  Thyroid  Function Tests: No results for input(s): TSH, T4TOTAL, FREET4, T3FREE, THYROIDAB in the last 72 hours.  Anemia Panel: Recent Labs    05/17/24 0556  VITAMINB12 378  FOLATE 5.2*  FERRITIN 332*  TIBC 195*  IRON 13*  RETICCTPCT 2.8    Urine analysis:    Component Value Date/Time   COLORURINE YELLOW 05/15/2024 1336   APPEARANCEUR CLEAR 05/15/2024 1336   LABSPEC 1.025 05/15/2024 1336   PHURINE 6.0 05/15/2024 1336   GLUCOSEU NEGATIVE 05/15/2024 1336   HGBUR MODERATE (A) 05/15/2024 1336   BILIRUBINUR NEGATIVE 05/15/2024 1336   BILIRUBINUR negative 12/08/2020 1009   BILIRUBINUR negative 05/04/2020 1349   KETONESUR NEGATIVE 05/15/2024 1336   PROTEINUR NEGATIVE 05/15/2024 1336   UROBILINOGEN  0.2 12/08/2020 1009   UROBILINOGEN 0.2 09/12/2013 1642   NITRITE POSITIVE (A) 05/15/2024 1336   LEUKOCYTESUR SMALL (A) 05/15/2024 1336    Sepsis Labs: Lactic Acid, Venous    Component Value Date/Time   LATICACIDVEN 1.5 05/16/2024 0334    MICROBIOLOGY: Recent Results (from the past 240 hours)  Blood Culture (routine x 2)     Status: None (Preliminary result)   Collection Time: 05/14/24  7:40 PM   Specimen: BLOOD LEFT ARM  Result Value Ref Range Status   Specimen Description BLOOD LEFT ARM  Final   Special Requests   Final    BOTTLES DRAWN AEROBIC AND ANAEROBIC Blood Culture adequate volume   Culture   Final    NO GROWTH 4 DAYS Performed at [facility], [address] [place], [place] [address]    Report Status PENDING  Incomplete  Blood Culture (routine x 2)     Status: None (Preliminary result)   Collection Time: 05/14/24  7:41 PM   Specimen: BLOOD RIGHT ARM  Result Value Ref Range Status   Specimen Description BLOOD RIGHT ARM  Final   Special Requests   Final    BOTTLES DRAWN AEROBIC AND ANAEROBIC Blood Culture adequate volume   Culture   Final    NO GROWTH 4 DAYS Performed at [facility], [address] [place], [place] [address]    Report Status PENDING  Incomplete    RADIOLOGY STUDIES/RESULTS: No results found.    LOS: 4 days   Donalda Applebaum, MD  [facility]    To contact the attending provider between 7A-7P or the covering provider during after hours 7P-7A, please log into the web site [email or web address] and access using universal [place] password for that web site. If you do not have the password, please call the hospital operator.  05/18/2024, 10:18 AM

## 2024-05-18 NOTE — Progress Notes (Signed)
 Pt mostly oriented, to self, situation, sometimes place. Disoriented to time. Very forgetful; will forget events a few minutes after they happen. Re-education on keeping leg straight very frequent.   Pt uses call bell to request assistance to front desk frequently and appropriately. During pt care this shift, pt asked this RN where she was from. Pt became upset and raised voice despite this RN stating what town she was from. Pt then explained that she wanted to know where this RN was born, and became upset when she learned that this RN was not from the [place] . Pt then demanded RN to keep changing the channels until pt was satisfied, stating that her fingers don't work.  This shift, pt's room phone was not working and requested to use the RN's personal cell phone. This RN contacted the charge RN and diplomatic services operational officer, who then contacted maintenance. Multiple new phones were tried in the room, and none worked. Charge RN was able to help pt make an outgoing call.

## 2024-05-18 NOTE — Telephone Encounter (Signed)
 Patient has urinary incontinence secondary to CVA.  Also, contributing factor decreased ability to walk uses wheelchair.  Kyra Borer.  Will need incontinence supplies for 99 years (briefs)

## 2024-05-18 NOTE — Plan of Care (Signed)
   Problem: Coping: Goal: Ability to adjust to condition or change in health will improve Outcome: Progressing   Problem: Metabolic: Goal: Ability to maintain appropriate glucose levels will improve Outcome: Progressing   Problem: Nutritional: Goal: Maintenance of adequate nutrition will improve Outcome: Progressing

## 2024-05-18 NOTE — Plan of Care (Signed)
  Problem: Education: Goal: Ability to describe self-care measures that may prevent or decrease complications (Diabetes Survival Skills Education) will improve Outcome: Progressing Goal: Individualized Educational Video(s) Outcome: Progressing   Problem: Coping: Goal: Ability to adjust to condition or change in health will improve Outcome: Progressing   Problem: Fluid Volume: Goal: Ability to maintain a balanced intake and output will improve Outcome: Progressing   Problem: Health Behavior/Discharge Planning: Goal: Ability to identify and utilize available resources and services will improve Outcome: Progressing Goal: Ability to manage health-related needs will improve Outcome: Progressing   Problem: Metabolic: Goal: Ability to maintain appropriate glucose levels will improve Outcome: Progressing   Problem: Nutritional: Goal: Maintenance of adequate nutrition will improve Outcome: Progressing Goal: Progress toward achieving an optimal weight will improve Outcome: Progressing   Problem: Skin Integrity: Goal: Risk for impaired skin integrity will decrease Outcome: Progressing   Problem: Tissue Perfusion: Goal: Adequacy of tissue perfusion will improve Outcome: Progressing   Problem: Safety: Goal: Non-violent Restraint(s) Outcome: Progressing   Problem: Education: Goal: Knowledge of General Education information will improve Description: Including pain rating scale, medication(s)/side effects and non-pharmacologic comfort measures Outcome: Progressing   Problem: Health Behavior/Discharge Planning: Goal: Ability to manage health-related needs will improve Outcome: Progressing   Problem: Clinical Measurements: Goal: Ability to maintain clinical measurements within normal limits will improve Outcome: Progressing Goal: Will remain free from infection Outcome: Progressing Goal: Diagnostic test results will improve Outcome: Progressing Goal: Respiratory complications  will improve Outcome: Progressing Goal: Cardiovascular complication will be avoided Outcome: Progressing   Problem: Activity: Goal: Risk for activity intolerance will decrease Outcome: Progressing   Problem: Nutrition: Goal: Adequate nutrition will be maintained Outcome: Progressing   Problem: Coping: Goal: Level of anxiety will decrease Outcome: Progressing   Problem: Elimination: Goal: Will not experience complications related to bowel motility Outcome: Progressing Goal: Will not experience complications related to urinary retention Outcome: Progressing   Problem: Pain Managment: Goal: General experience of comfort will improve and/or be controlled Outcome: Progressing   Problem: Safety: Goal: Ability to remain free from injury will improve Outcome: Progressing   Problem: Skin Integrity: Goal: Risk for impaired skin integrity will decrease Outcome: Progressing

## 2024-05-18 NOTE — Progress Notes (Signed)
 HOSPITAL MEDICINE OVERNIGHT EVENT NOTE    Notified by nursing that patient's hemoglobin is 6.9 this morning.  According to nursing patient is not exhibiting any evidence of active bleeding or bruising.  Patient is not complaining of any shortness of breath or chest pain.  Chart reviewed, patient is hospitalized for small bowel obstruction and has a history of paroxysmal atrial fibrillation with rapid ventricular response currently on intravenous heparin  infusion.  Hemoglobin seems to have downtrended over the past several days so this laboratory finding does not seem erroneous.  Ordering a type and screen as well as a 1 unit packed red blood cell transfusion.  Will monitor closely.  Zachary JINNY Ba  MD [facility]

## 2024-05-19 DIAGNOSIS — I4892 Unspecified atrial flutter: Secondary | ICD-10-CM | POA: Diagnosis not present

## 2024-05-19 DIAGNOSIS — I483 Typical atrial flutter: Secondary | ICD-10-CM | POA: Diagnosis not present

## 2024-05-19 DIAGNOSIS — K56609 Unspecified intestinal obstruction, unspecified as to partial versus complete obstruction: Secondary | ICD-10-CM | POA: Diagnosis not present

## 2024-05-19 DIAGNOSIS — D649 Anemia, unspecified: Secondary | ICD-10-CM | POA: Diagnosis not present

## 2024-05-19 LAB — GLUCOSE, CAPILLARY
Glucose-Capillary: 169 mg/dL — ABNORMAL HIGH (ref 70–99)
Glucose-Capillary: 178 mg/dL — ABNORMAL HIGH (ref 70–99)
Glucose-Capillary: 189 mg/dL — ABNORMAL HIGH (ref 70–99)
Glucose-Capillary: 209 mg/dL — ABNORMAL HIGH (ref 70–99)
Glucose-Capillary: 223 mg/dL — ABNORMAL HIGH (ref 70–99)
Glucose-Capillary: 235 mg/dL — ABNORMAL HIGH (ref 70–99)
Glucose-Capillary: 240 mg/dL — ABNORMAL HIGH (ref 70–99)

## 2024-05-19 LAB — BASIC METABOLIC PANEL WITH GFR
Anion gap: 11 (ref 5–15)
BUN: 6 mg/dL — ABNORMAL LOW (ref 8–23)
CO2: 22 mmol/L (ref 22–32)
Calcium: 8 mg/dL — ABNORMAL LOW (ref 8.9–10.3)
Chloride: 102 mmol/L (ref 98–111)
Creatinine, Ser: 0.86 mg/dL (ref 0.44–1.00)
GFR, Estimated: >60 mL/min (ref >60)
Glucose, Bld: 186 mg/dL — ABNORMAL HIGH (ref 70–99)
Potassium: 3.4 mmol/L — ABNORMAL LOW (ref 3.5–5.1)
Sodium: 135 mmol/L (ref 135–145)

## 2024-05-19 LAB — CBC
HCT: 22.1 % — ABNORMAL LOW (ref 36.0–46.0)
Hemoglobin: 7.5 g/dL — ABNORMAL LOW (ref 12.0–15.0)
MCH: 26.7 pg (ref 26.0–34.0)
MCHC: 33.9 g/dL (ref 30.0–36.0)
MCV: 78.6 fL — ABNORMAL LOW (ref 80.0–100.0)
Platelets: 355 K/uL (ref 150–400)
RBC: 2.81 MIL/uL — ABNORMAL LOW (ref 3.87–5.11)
RDW: 17.6 % — ABNORMAL HIGH (ref 11.5–15.5)
WBC: 13.2 K/uL — ABNORMAL HIGH (ref 4.0–10.5)
nRBC: 1.1 % — ABNORMAL HIGH (ref 0.0–0.2)

## 2024-05-19 LAB — CULTURE, BLOOD (ROUTINE X 2)
Culture: NO GROWTH
Culture: NO GROWTH
Special Requests: ADEQUATE
Special Requests: ADEQUATE

## 2024-05-19 LAB — MAGNESIUM: Magnesium: 1.9 mg/dL (ref 1.7–2.4)

## 2024-05-19 LAB — PREPARE RBC (CROSSMATCH)

## 2024-05-19 MED ORDER — SODIUM CHLORIDE 0.9% IV SOLUTION: Freq: Once | INTRAVENOUS | Status: AC

## 2024-05-19 MED ORDER — ACETAMINOPHEN 500 MG PO TABS
1000.0000 mg | ORAL_TABLET | Freq: Three times a day (TID) | ORAL | Status: DC
  Administered 2024-05-19 – 2024-05-22 (×9): 1000 mg via ORAL
  Filled 2024-05-19 (×10): qty 2

## 2024-05-19 MED ORDER — METOPROLOL TARTRATE 25 MG PO TABS
25.0000 mg | ORAL_TABLET | Freq: Two times a day (BID) | ORAL | Status: DC
  Administered 2024-05-19 – 2024-05-22 (×6): 25 mg via ORAL
  Filled 2024-05-19 (×6): qty 1

## 2024-05-19 MED ORDER — DIPHENHYDRAMINE HCL 25 MG PO CAPS
25.0000 mg | ORAL_CAPSULE | Freq: Once | ORAL | Status: AC
  Administered 2024-05-19: 25 mg via ORAL
  Filled 2024-05-19: qty 1

## 2024-05-19 MED ORDER — ACETAMINOPHEN 325 MG PO TABS
650.0000 mg | ORAL_TABLET | Freq: Once | ORAL | Status: AC
  Administered 2024-05-19: 650 mg via ORAL
  Filled 2024-05-19: qty 2

## 2024-05-19 MED ORDER — MAGNESIUM SULFATE 2 GM/50ML IV SOLN
2.0000 g | Freq: Once | INTRAVENOUS | Status: AC
  Administered 2024-05-19: 2 g via INTRAVENOUS
  Filled 2024-05-19: qty 50

## 2024-05-19 MED ORDER — POTASSIUM CHLORIDE CRYS ER 20 MEQ PO TBCR
40.0000 meq | EXTENDED_RELEASE_TABLET | ORAL | Status: AC
  Administered 2024-05-19 (×2): 40 meq via ORAL
  Filled 2024-05-19 (×2): qty 2

## 2024-05-19 MED ORDER — ENOXAPARIN SODIUM 40 MG/0.4ML IJ SOSY
40.0000 mg | PREFILLED_SYRINGE | INTRAMUSCULAR | Status: DC
  Administered 2024-05-19: 40 mg via SUBCUTANEOUS
  Filled 2024-05-19 (×2): qty 0.4

## 2024-05-19 NOTE — Progress Notes (Signed)
 Rounding Note   Patient Name: Rhonda Hickman Date of Encounter: 05/19/2024  [facility] Cardiologist: Lonni LITTIE [name], MD   Subjective No complaints  Scheduled Meds:  acetaminophen   1,000 mg Oral Q8H   amiodarone  200 mg Oral BID   fluticasone  furoate-vilanterol  1 puff Inhalation Daily   insulin  aspart  0-9 Units Subcutaneous Q4H   metoprolol  tartrate  12.5 mg Oral Q6H   potassium chloride  40 mEq Oral Q4H   rosuvastatin   10 mg Oral Daily   Continuous Infusions:  PRN Meds: hydrALAZINE , ipratropium-albuterol , ondansetron  (ZOFRAN ) IV, oxyCODONE    Vital Signs  Vitals:   05/19/24 0317 05/19/24 0400 05/19/24 0741 05/19/24 0750  BP: (!) 144/58 134/60 137/64   Pulse: 71 73 74   Resp: 17 17 17    Temp: 98 F (36.7 C)  98.5 F (36.9 C)   TempSrc: Oral  Oral   SpO2: 97% 97% 98% 97%  Weight:      Height:        Intake/Output Summary (Last 24 hours) at 05/19/2024 0850 Last data filed at 05/19/2024 0100 Gross per 24 hour  Intake 450 ml  Output 1300 ml  Net -850 ml      05/14/2024    7:21 PM 05/10/2024    7:09 AM 05/06/2024    1:21 PM  Last 3 Weights  Weight (lbs) 233 lb 11 oz 233 lb 11 oz 233 lb 11 oz  Weight (kg) 106 kg 106 kg 106 kg      Telemetry SR - Personally Reviewed  ECG  N/a - Personally Reviewed  Physical Exam  GEN: No acute distress.   Neck: No JVD Cardiac: RRR, no murmurs, rubs, or gallops.  Respiratory: Clear to auscultation bilaterally. GI: Soft, nontender, non-distended  MS: No edema; No deformity. Neuro:  Nonfocal  Psych: Normal affect   Labs High Sensitivity Troponin:  No results for input(s): TROPONINIHS in the last 720 hours.   Chemistry Recent Labs  Lab 05/14/24 1940 05/14/24 1953 05/15/24 0429 05/15/24 1215 05/17/24 0556 05/18/24 0416 05/19/24 0429  NA 132*   < > 129*   < > 138 135 135  K 5.9*   < > 4.3   < > 3.2* 3.0* 3.4*  CL 96*  --  92*   < > 106 103 102  CO2 20*  --  20*   < > 19* 22 22  GLUCOSE  110*  --  470*   < > 167* 153* 186*  BUN 64*  --  46*   < > 18 9 6*  CREATININE 1.64*  --  1.30*   < > 1.03* 0.87 0.86  CALCIUM  8.2*  --  7.5*   < > 7.7* 7.9* 8.0*  MG  --   --  2.3  --   --  1.9 1.9  PROT 7.6  --  6.7  --  6.2*  --   --   ALBUMIN 2.2*  --  2.3*  --  2.0*  --   --   AST 34  --  19  --  14*  --   --   ALT 28  --  20  --  13  --   --   ALKPHOS 73  --  56  --  53  --   --   BILITOT 0.9  --  0.5  --  0.6  --   --   GFRNONAA 34*  --  45*   < > 60* >  60 >60  ANIONGAP 16*  --  17*   < > 13 10 11    < > = values in this interval not displayed.    Lipids No results for input(s): CHOL, TRIG, HDL, LABVLDL, LDLCALC, CHOLHDL in the last 168 hours.  Hematology Recent Labs  Lab 05/18/24 0416 05/18/24 1636 05/19/24 0429  WBC 14.4* 14.2* 13.2*  RBC 2.60* 2.94* 2.81*  HGB 6.9* 7.8* 7.5*  HCT 20.3* 23.1* 22.1*  MCV 78.1* 78.6* 78.6*  MCH 26.5 26.5 26.7  MCHC 34.0 33.8 33.9  RDW 18.7* 18.0* 17.6*  PLT 346 345 355   Thyroid  No results for input(s): TSH, FREET4 in the last 168 hours.  BNPNo results for input(s): BNP, PROBNP in the last 168 hours.  DDimer No results for input(s): DDIMER in the last 168 hours.   Radiology  No results found.    Patient Profile   Rhonda Hickman is a 68 y.o. female with a hx of TIA, paroxysmal atrial fibrillation, BPD, COPD, T2DM, asthma, hyperlipidemia, tobacco use, mild OSA no indication for CPAP, hypertension  who is being seen 05/15/2024 for the evaluation of A-Fib with RVR at the request of Dr. Arlice.   Assessment & Plan   1.PAF/aflutter with RVR - started on IV amio this admission, management complicated as she was initially NPO for SBO and also complicated by some prior low bp's - SBO has resolved. She also converted to NSR yesterday on IV amio - transitoned to oral 200mg  bid x 3 weeks then 200mg  daily - had been on lopressor  12.5mg  every hours hours, consolidate to 25mg  bid.   - with further drop in Hgb heparin   stopped, will not start DOAC yet. Follow H&H trend before restarting, she received 1 unit pRBCs yesterday     2.SBO - has resolved with conservative management      3.Anemia - down to 6.9 yesterday AM, transfused 1 unit - stop heparin  for now, ongoing management per primary team - up to 7.8 after 1 unit, today slight down trend to 7.5      For questions or updates, please contact [facility] Please consult [email or web address] for contact info under       Signed, Alvan Carrier, MD  05/19/2024, 8:50 AM

## 2024-05-19 NOTE — Progress Notes (Signed)
 PROGRESS NOTE        PATIENT DETAILS Name: Rhonda Hickman Age: 68 y.o. Sex: female Date of Birth: 1955/11/21 Admit Date: 05/14/2024 Admitting Physician Terry LOISE Hurst, DO ERE:[email or web address], Aureliano SAUNDERS, MD  Brief Summary: Patient is a 68 y.o.  female with history of PAF on Eliquis-who recently underwent right knee replacement on 10/31-subsequently discharged to SNF on 11/3-brought to the ED on 11/4 for altered mental status, A-fib RVR-found to have SBO.  Significant events: 11/4>> admit to [facility]  Significant studies: 11/4>> CT head: No acute abnormality 11/5>> CT chest/abdomen/pelvis: Findings suspicious for distal SBO.  Moderate stool burden.  Significant microbiology data: 11/4>> blood culture: No growth  Procedures: None  Consults: General Surgery Cardiology  Subjective: Frustrated that she is still in the hospital-multiple BMs yesterday-brown-colored-no hematochezia/melena or hematemesis.  Tolerating diet.  No vomiting.  Objective: Vitals: Blood pressure 139/75, pulse 74, temperature 98.5 F (36.9 C), temperature source Oral, resp. rate 17, height 6' (1.829 m), weight 106 kg, SpO2 97%.   Exam: Gen Exam:Alert awake-not in any distress HEENT:atraumatic, normocephalic Chest: B/L clear to auscultation anteriorly CVS:S1S2 regular Abdomen:soft non tender, non distended Extremities:no edema Neurology: Non focal Skin: no rash  Pertinent Labs/Radiology:    Latest Ref Rng & Units 05/19/2024    4:29 AM 05/18/2024    4:36 PM 05/18/2024    4:16 AM  CBC  WBC 4.0 - 10.5 K/uL 13.2  14.2  14.4   Hemoglobin 12.0 - 15.0 g/dL 7.5  7.8  6.9   Hematocrit 36.0 - 46.0 % 22.1  23.1  20.3   Platelets 150 - 400 K/uL 355  345  346     Lab Results  Component Value Date   NA 135 05/19/2024   K 3.4 (L) 05/19/2024   CL 102 05/19/2024   CO2 22 05/19/2024      Assessment/Plan: SBO Resolved with conservative management Tolerating diet Numerous BMs almost on a  daily basis.  PAF with RVR Back in sinus rhythm No longer on amiodarone infusion Stable on oral amiodarone/metoprolol .   Anticoagulation held due to drop in hemoglobin-see below.  Acute toxic metabolic encephalopathy Suspect this is from initially Depakote  use and then Compazine  use Both agents have been discontinued She is completely awake and alert this morning. Minimize narcotics/sedating agents as much as possible Delirium precautions.  Lactic acidosis Probably secondary to dehydration from vomiting No evidence of PNA on CT imaging-no longer on Zosyn.  Chronic HFpEF Euvolemic Diuretics as needed  AKI on CKD stage II AKI hemodynamically mediated-improving with supportive care  Hypokalemia Replete/recheck  Microcytic anemia Appears to have microcytic anemia at baseline (probable iron deficiency-on iron supplementation at home)-hemoglobin worse due to acute illness/perhaps perioperative blood loss from recent knee surgery-no overt GI bleeding noted--brown stools per patient.  Given her cardiac issues-Will go ahead and transfuse 1 more unit of PRBC today and keep Hb> 8. Follow CBC  DM-2 CBG stable SSI  Recent Labs    05/19/24 0032 05/19/24 0320 05/19/24 0748  GLUCAP 209* 189* 178*     HLD Resume statin  COPD Not in exacerbation Continue bronchodilators  Hypokalemia/hypomagnesemia Replete/recheck  Mood disorder She tells me that she takes both Wellbutrin  and Depakote -Will verify with pharmacy before resuming given some concern that her toxic encephalopathy was in part from Depakote  use.  Check Depakote  levels with a.m. labs.  Recent right  knee replacement on 10/31 (Dr. Vernetta) PT/OT Minimal narcotics if possible. High risk for VTE-holding therapeutic anticoagulation but suspect okay to start prophylactic anticoagulation for a few days-once Hb stable-and we are sure that there is no bleeding issues-we can switch over back to DOAC.  Class 1  Obesity: Estimated body mass index is 31.69 kg/m as calculated from the following:   Height as of this encounter: 6' (1.829 m).   Weight as of this encounter: 106 kg.   Code status:   Code Status: Full Code   DVT Prophylaxis:IV heparin    Family Communication: Sister at bedside on 11/6-none at bedside this morning.   Disposition Plan: Status is: Inpatient Remains inpatient appropriate because: Severity of illness   Planned Discharge Destination:Skilled nursing facility   Diet: Diet Order             Diet Carb Modified Fluid consistency: Thin; Room service appropriate? Yes  Diet effective now                     Antimicrobial agents: Anti-infectives (From admission, onward)    Start     Dose/Rate Route Frequency Ordered Stop   05/15/24 0600  piperacillin-tazobactam (ZOSYN) IVPB 3.375 g  Status:  Discontinued        3.375 g 12.5 mL/hr over 240 Minutes Intravenous Every 8 hours 05/15/24 0031 05/16/24 1025   05/14/24 2145  cefTRIAXone  (ROCEPHIN ) 2 g in sodium chloride  0.9 % 100 mL IVPB        2 g 200 mL/hr over 30 Minutes Intravenous  Once 05/14/24 2131 05/14/24 2357   05/14/24 2145  azithromycin  (ZITHROMAX ) 500 mg in sodium chloride  0.9 % 250 mL IVPB        500 mg 250 mL/hr over 60 Minutes Intravenous  Once 05/14/24 2131 05/15/24 0116        MEDICATIONS: Scheduled Meds:  sodium chloride    Intravenous Once   acetaminophen   1,000 mg Oral Q8H   acetaminophen   650 mg Oral Once   amiodarone  200 mg Oral BID   diphenhydrAMINE   25 mg Oral Once   fluticasone  furoate-vilanterol  1 puff Inhalation Daily   insulin  aspart  0-9 Units Subcutaneous Q4H   metoprolol  tartrate  25 mg Oral BID   potassium chloride  40 mEq Oral Q4H   rosuvastatin   10 mg Oral Daily   Continuous Infusions:  magnesium  sulfate bolus IVPB      PRN Meds:.hydrALAZINE , ipratropium-albuterol , ondansetron  (ZOFRAN ) IV, oxyCODONE    I have personally reviewed following labs and imaging  studies  LABORATORY DATA: CBC: Recent Labs  Lab 05/14/24 1940 05/14/24 1953 05/15/24 0429 05/15/24 1215 05/16/24 0334 05/17/24 0556 05/18/24 0416 05/18/24 1636 05/19/24 0429  WBC 13.7*  --  8.8   < > 9.5 10.5 14.4* 14.2* 13.2*  NEUTROABS 9.1*  --  5.3  --  6.2  --   --   --   --   HGB 9.6*   < > 7.6*   < > 8.1* 7.4* 6.9* 7.8* 7.5*  HCT 29.8*   < > 23.9*   < > 25.0* 23.5* 20.3* 23.1* 22.1*  MCV 80.5  --  81.0   < > 79.4* 81.0 78.1* 78.6* 78.6*  PLT 334  --  283   < > 355 348 346 345 355   < > = values in this interval not displayed.    Basic Metabolic Panel: Recent Labs  Lab 05/15/24 0429 05/15/24 1215 05/16/24 0334 05/17/24 0556 05/18/24 0416  05/19/24 0429  NA 129* 131* 137 138 135 135  K 4.3 4.3 3.6 3.2* 3.0* 3.4*  CL 92* 98 103 106 103 102  CO2 20* 18* 19* 19* 22 22  GLUCOSE 470* 251* 219* 167* 153* 186*  BUN 46* 46* 36* 18 9 6*  CREATININE 1.30* 1.37* 1.26* 1.03* 0.87 0.86  CALCIUM  7.5* 7.9* 8.3* 7.7* 7.9* 8.0*  MG 2.3  --   --   --  1.9 1.9  PHOS 3.7  --   --   --   --   --     GFR: Estimated Creatinine Clearance: 86.5 mL/min (by C-G formula based on SCr of 0.86 mg/dL).  Liver Function Tests: Recent Labs  Lab 05/14/24 1940 05/15/24 0429 05/17/24 0556  AST 34 19 14*  ALT 28 20 13   ALKPHOS 73 56 53  BILITOT 0.9 0.5 0.6  PROT 7.6 6.7 6.2*  ALBUMIN 2.2* 2.3* 2.0*   No results for input(s): LIPASE, AMYLASE in the last 168 hours. Recent Labs  Lab 05/14/24 1940  AMMONIA 28    Coagulation Profile: No results for input(s): INR, PROTIME in the last 168 hours.  Cardiac Enzymes: No results for input(s): CKTOTAL, CKMB, CKMBINDEX, TROPONINI in the last 168 hours.  BNP (last 3 results) No results for input(s): PROBNP in the last 8760 hours.  Lipid Profile: No results for input(s): CHOL, HDL, LDLCALC, TRIG, CHOLHDL, LDLDIRECT in the last 72 hours.  Thyroid  Function Tests: No results for input(s): TSH, T4TOTAL,  FREET4, T3FREE, THYROIDAB in the last 72 hours.  Anemia Panel: Recent Labs    05/17/24 0556  VITAMINB12 378  FOLATE 5.2*  FERRITIN 332*  TIBC 195*  IRON 13*  RETICCTPCT 2.8    Urine analysis:    Component Value Date/Time   COLORURINE YELLOW 05/15/2024 1336   APPEARANCEUR CLEAR 05/15/2024 1336   LABSPEC 1.025 05/15/2024 1336   PHURINE 6.0 05/15/2024 1336   GLUCOSEU NEGATIVE 05/15/2024 1336   HGBUR MODERATE (A) 05/15/2024 1336   BILIRUBINUR NEGATIVE 05/15/2024 1336   BILIRUBINUR negative 12/08/2020 1009   BILIRUBINUR negative 05/04/2020 1349   KETONESUR NEGATIVE 05/15/2024 1336   PROTEINUR NEGATIVE 05/15/2024 1336   UROBILINOGEN 0.2 12/08/2020 1009   UROBILINOGEN 0.2 09/12/2013 1642   NITRITE POSITIVE (A) 05/15/2024 1336   LEUKOCYTESUR SMALL (A) 05/15/2024 1336    Sepsis Labs: Lactic Acid, Venous    Component Value Date/Time   LATICACIDVEN 1.5 05/16/2024 0334    MICROBIOLOGY: Recent Results (from the past 240 hours)  Blood Culture (routine x 2)     Status: None   Collection Time: 05/14/24  7:40 PM   Specimen: BLOOD LEFT ARM  Result Value Ref Range Status   Specimen Description BLOOD LEFT ARM  Final   Special Requests   Final    BOTTLES DRAWN AEROBIC AND ANAEROBIC Blood Culture adequate volume   Culture   Final    NO GROWTH 5 DAYS Performed at [facility], [address], [place], [place] [address]    Report Status 05/19/2024 FINAL  Final  Blood Culture (routine x 2)     Status: None   Collection Time: 05/14/24  7:41 PM   Specimen: BLOOD RIGHT ARM  Result Value Ref Range Status   Specimen Description BLOOD RIGHT ARM  Final   Special Requests   Final    BOTTLES DRAWN AEROBIC AND ANAEROBIC Blood Culture adequate volume   Culture   Final    NO GROWTH 5 DAYS Performed at [facility]  [facility], [address], [place], [place] [address]    Report Status 05/19/2024 FINAL  Final    RADIOLOGY STUDIES/RESULTS: No results found.    LOS: 5 days    Donalda Applebaum, MD  [facility]    To contact the attending provider between 7A-7P or the covering provider during after hours 7P-7A, please log into the web site [email or web address] and access using universal [place] password for that web site. If you do not have the password, please call the hospital operator.  05/19/2024, 10:15 AM

## 2024-05-19 NOTE — Plan of Care (Signed)

## 2024-05-19 NOTE — Plan of Care (Signed)
  Problem: Education: Goal: Knowledge of General Education information will improve Description: Including pain rating scale, medication(s)/side effects and non-pharmacologic comfort measures Outcome: Progressing   Problem: Health Behavior/Discharge Planning: Goal: Ability to manage health-related needs will improve Outcome: Progressing   Problem: Safety: Goal: Non-violent Restraint(s) Outcome: Completed/Met

## 2024-05-19 NOTE — Plan of Care (Signed)
   Problem: Coping: Goal: Ability to adjust to condition or change in health will improve Outcome: Progressing   Problem: Nutritional: Goal: Maintenance of adequate nutrition will improve Outcome: Progressing

## 2024-05-20 ENCOUNTER — Other Ambulatory Visit (INDEPENDENT_AMBULATORY_CARE_PROVIDER_SITE_OTHER): Payer: Self-pay | Admitting: Primary Care

## 2024-05-20 DIAGNOSIS — I4891 Unspecified atrial fibrillation: Secondary | ICD-10-CM

## 2024-05-20 DIAGNOSIS — K56609 Unspecified intestinal obstruction, unspecified as to partial versus complete obstruction: Secondary | ICD-10-CM | POA: Diagnosis not present

## 2024-05-20 DIAGNOSIS — D649 Anemia, unspecified: Secondary | ICD-10-CM

## 2024-05-20 DIAGNOSIS — R931 Abnormal findings on diagnostic imaging of heart and coronary circulation: Secondary | ICD-10-CM

## 2024-05-20 DIAGNOSIS — E782 Mixed hyperlipidemia: Secondary | ICD-10-CM

## 2024-05-20 DIAGNOSIS — N3942 Incontinence without sensory awareness: Secondary | ICD-10-CM

## 2024-05-20 DIAGNOSIS — Z760 Encounter for issue of repeat prescription: Secondary | ICD-10-CM

## 2024-05-20 DIAGNOSIS — Z7901 Long term (current) use of anticoagulants: Secondary | ICD-10-CM

## 2024-05-20 DIAGNOSIS — I483 Typical atrial flutter: Secondary | ICD-10-CM | POA: Diagnosis not present

## 2024-05-20 LAB — BPAM RBC
Blood Product Expiration Date: 202511212359
Blood Product Expiration Date: 202511242359
ISSUE DATE / TIME: 202511081003
ISSUE DATE / TIME: 202511091235
Unit Type and Rh: 6200
Unit Type and Rh: 6200

## 2024-05-20 LAB — TYPE AND SCREEN
ABO/RH(D): A POS
Antibody Screen: NEGATIVE
Unit division: 0
Unit division: 0

## 2024-05-20 LAB — CBC
HCT: 27.6 % — ABNORMAL LOW (ref 36.0–46.0)
Hemoglobin: 9.2 g/dL — ABNORMAL LOW (ref 12.0–15.0)
MCH: 26.7 pg (ref 26.0–34.0)
MCHC: 33.3 g/dL (ref 30.0–36.0)
MCV: 80.2 fL (ref 80.0–100.0)
Platelets: 367 K/uL (ref 150–400)
RBC: 3.44 MIL/uL — ABNORMAL LOW (ref 3.87–5.11)
RDW: 17.6 % — ABNORMAL HIGH (ref 11.5–15.5)
WBC: 15.2 K/uL — ABNORMAL HIGH (ref 4.0–10.5)
nRBC: 1.1 % — ABNORMAL HIGH (ref 0.0–0.2)

## 2024-05-20 LAB — VALPROIC ACID LEVEL: Valproic Acid Lvl: <10 ug/mL — ABNORMAL LOW (ref 50–100)

## 2024-05-20 LAB — GLUCOSE, CAPILLARY
Glucose-Capillary: 159 mg/dL — ABNORMAL HIGH (ref 70–99)
Glucose-Capillary: 162 mg/dL — ABNORMAL HIGH (ref 70–99)
Glucose-Capillary: 170 mg/dL — ABNORMAL HIGH (ref 70–99)
Glucose-Capillary: 219 mg/dL — ABNORMAL HIGH (ref 70–99)
Glucose-Capillary: 242 mg/dL — ABNORMAL HIGH (ref 70–99)
Glucose-Capillary: 253 mg/dL — ABNORMAL HIGH (ref 70–99)

## 2024-05-20 LAB — BASIC METABOLIC PANEL WITH GFR
Anion gap: 12 (ref 5–15)
BUN: 6 mg/dL — ABNORMAL LOW (ref 8–23)
CO2: 22 mmol/L (ref 22–32)
Calcium: 8.3 mg/dL — ABNORMAL LOW (ref 8.9–10.3)
Chloride: 103 mmol/L (ref 98–111)
Creatinine, Ser: 0.84 mg/dL (ref 0.44–1.00)
GFR, Estimated: >60 mL/min (ref >60)
Glucose, Bld: 176 mg/dL — ABNORMAL HIGH (ref 70–99)
Potassium: 3.8 mmol/L (ref 3.5–5.1)
Sodium: 137 mmol/L (ref 135–145)

## 2024-05-20 MED ORDER — BUPROPION HCL ER (SR) 150 MG PO TB12
150.0000 mg | ORAL_TABLET | Freq: Two times a day (BID) | ORAL | Status: DC
  Administered 2024-05-20 – 2024-05-22 (×5): 150 mg via ORAL
  Filled 2024-05-20 (×7): qty 1

## 2024-05-20 MED ORDER — RIVAROXABAN 20 MG PO TABS
20.0000 mg | ORAL_TABLET | Freq: Every day | ORAL | Status: DC
  Administered 2024-05-20 – 2024-05-21 (×2): 20 mg via ORAL
  Filled 2024-05-20 (×2): qty 1

## 2024-05-20 MED ORDER — DIVALPROEX SODIUM ER 500 MG PO TB24
1000.0000 mg | ORAL_TABLET | Freq: Every day | ORAL | Status: DC
  Administered 2024-05-20 – 2024-05-21 (×2): 1000 mg via ORAL
  Filled 2024-05-20 (×2): qty 2

## 2024-05-20 MED ORDER — FERROUS SULFATE 325 (65 FE) MG PO TABS
325.0000 mg | ORAL_TABLET | Freq: Two times a day (BID) | ORAL | Status: DC
  Administered 2024-05-20 – 2024-05-22 (×5): 325 mg via ORAL
  Filled 2024-05-20 (×5): qty 1

## 2024-05-20 MED ORDER — DIVALPROEX SODIUM ER 500 MG PO TB24
500.0000 mg | ORAL_TABLET | Freq: Every day | ORAL | Status: DC
  Administered 2024-05-20 – 2024-05-22 (×3): 500 mg via ORAL
  Filled 2024-05-20 (×3): qty 1

## 2024-05-20 MED ORDER — DIVALPROEX SODIUM ER 500 MG PO TB24: 500.0000 mg | ORAL_TABLET | ORAL | Status: DC

## 2024-05-20 NOTE — Progress Notes (Addendum)
 PROGRESS NOTE        PATIENT DETAILS Name: Rhonda Hickman Age: 68 y.o. Sex: female Date of Birth: July 28, 1955 Admit Date: 05/14/2024 Admitting Physician Terry LOISE Hurst, DO ERE:[email or web address], Aureliano SAUNDERS, MD  Brief Summary: Patient is a 68 y.o.  female with history of PAF on Eliquis-who recently underwent right knee replacement on 10/31-subsequently discharged to SNF on 11/3-brought to the ED on 11/4 for altered mental status, A-fib RVR-found to have SBO.  Significant events: 11/4>> admit to [facility]  Significant studies: 11/4>> CT head: No acute abnormality 11/5>> CT chest/abdomen/pelvis: Findings suspicious for distal SBO.  Moderate stool burden.  Significant microbiology data: 11/4>> blood culture: No growth  Procedures: None  Consults: General Surgery Cardiology  Subjective: No major issues overnight-she does not like physical therapy here in the hospital-and claims she would refuse to work with the therapist.  Having BMs on a daily basis-claims stool is brown/yellow in color.  Objective: Vitals: Blood pressure (!) 150/66, pulse 83, temperature 98.7 F (37.1 C), temperature source Oral, resp. rate 17, height 6' (1.829 m), weight 106 kg, SpO2 97%.   Exam: Gen Exam:Alert awake-not in any distress HEENT:atraumatic, normocephalic Chest: B/L clear to auscultation anteriorly CVS:S1S2 regular Abdomen:soft non tender, non distended Extremities:no edema-some mild swelling around the right knee-area appears without any significant erythema/tenderness. Neurology: Non focal Skin: no rash  Pertinent Labs/Radiology:    Latest Ref Rng & Units 05/20/2024    4:28 AM 05/19/2024    4:29 AM 05/18/2024    4:36 PM  CBC  WBC 4.0 - 10.5 K/uL 15.2  13.2  14.2   Hemoglobin 12.0 - 15.0 g/dL 9.2  7.5  7.8   Hematocrit 36.0 - 46.0 % 27.6  22.1  23.1   Platelets 150 - 400 K/uL 367  355  345     Lab Results  Component Value Date   NA 137 05/20/2024   K 3.8 05/20/2024    CL 103 05/20/2024   CO2 22 05/20/2024      Assessment/Plan: SBO Resolved with conservative management Tolerating diet Numerous BMs almost on a daily basis.  PAF with RVR Back in sinus rhythm No longer on amiodarone infusion Stable on oral amiodarone/metoprolol .   Anticoagulation held due to drop in hemoglobin but no overt bleeding-Hb now stable after 2 units of PRBC-suspect can be resumed today.  Acute toxic metabolic encephalopathy Initially this was suspected to be from Depakote  but this is a chronic medication for her-in retrospect-I suspect this was from narcotics and Compazine . She is awake/alert-continue to minimize narcotics/sedating agents as much as possible.  Lactic acidosis Probably secondary to dehydration from vomiting No evidence of PNA on CT imaging-no longer on Zosyn.  Chronic HFpEF Euvolemic Diuretics as needed  AKI on CKD stage II AKI hemodynamically mediated-improving with supportive care  Hypokalemia Replete/recheck  Microcytic anemia Appears to have microcytic anemia at baseline (probable iron deficiency-on iron supplementation at home)-hemoglobin worse due to acute illness/perhaps perioperative blood loss from recent knee surgery-no overt GI bleeding noted--brown stools per patient.  Transfused a total of 2 units of PRBC-Hb stable today. Continue to follow CBC.  DM-2 CBG stable SSI  Recent Labs    05/19/24 2354 05/20/24 0438 05/20/24 0757  GLUCAP 235* 162* 159*     HLD Resume statin  COPD Not in exacerbation Continue bronchodilators  Hypokalemia/hypomagnesemia Replete/recheck  Mood disorder Confirmed with  patient x 2-she is on both Depakote  and Wellbutrin -we will cautiously resume both these medications today and see how she does.  Recent right knee replacement on 10/31 (Dr. Vernetta) PT/OT Minimal narcotics if possible. Some mild perioperative swelling around the knee but do not see any obvious areas of infection.  White count  is mildly elevated but she otherwise looks stable. Xarelto  briefly held due to severe anemia-s/p 2 units of PRBC-subsequently started on prophylactic Lovenox  on 11/9-since no evidence of bleeding-Hb stable-resume DOAC today.  Suspect she is now medically stable for SNF   Class 1 Obesity: Estimated body mass index is 31.69 kg/m as calculated from the following:   Height as of this encounter: 6' (1.829 m).   Weight as of this encounter: 106 kg.   Code status:   Code Status: Full Code   DVT Prophylaxis:IV heparin    Family Communication: Sister at bedside on 11/6-none at bedside this morning.   Disposition Plan: Status is: Inpatient Remains inpatient appropriate because: Severity of illness   Planned Discharge Destination:Skilled nursing facility   Diet: Diet Order             Diet Carb Modified Fluid consistency: Thin; Room service appropriate? Yes  Diet effective now                     Antimicrobial agents: Anti-infectives (From admission, onward)    Start     Dose/Rate Route Frequency Ordered Stop   05/15/24 0600  piperacillin-tazobactam (ZOSYN) IVPB 3.375 g  Status:  Discontinued        3.375 g 12.5 mL/hr over 240 Minutes Intravenous Every 8 hours 05/15/24 0031 05/16/24 1025   05/14/24 2145  cefTRIAXone  (ROCEPHIN ) 2 g in sodium chloride  0.9 % 100 mL IVPB        2 g 200 mL/hr over 30 Minutes Intravenous  Once 05/14/24 2131 05/14/24 2357   05/14/24 2145  azithromycin  (ZITHROMAX ) 500 mg in sodium chloride  0.9 % 250 mL IVPB        500 mg 250 mL/hr over 60 Minutes Intravenous  Once 05/14/24 2131 05/15/24 0116        MEDICATIONS: Scheduled Meds:  acetaminophen   1,000 mg Oral Q8H   amiodarone  200 mg Oral BID   enoxaparin  (LOVENOX ) injection  40 mg Subcutaneous Q24H   ferrous sulfate   325 mg Oral BID WC   fluticasone  furoate-vilanterol  1 puff Inhalation Daily   insulin  aspart  0-9 Units Subcutaneous Q4H   metoprolol  tartrate  25 mg Oral BID   rosuvastatin    10 mg Oral Daily   Continuous Infusions:    PRN Meds:.hydrALAZINE , ipratropium-albuterol , ondansetron  (ZOFRAN ) IV, oxyCODONE    I have personally reviewed following labs and imaging studies  LABORATORY DATA: CBC: Recent Labs  Lab 05/14/24 1940 05/14/24 1953 05/15/24 0429 05/15/24 1215 05/16/24 0334 05/17/24 0556 05/18/24 0416 05/18/24 1636 05/19/24 0429 05/20/24 0428  WBC 13.7*  --  8.8   < > 9.5 10.5 14.4* 14.2* 13.2* 15.2*  NEUTROABS 9.1*  --  5.3  --  6.2  --   --   --   --   --   HGB 9.6*   < > 7.6*   < > 8.1* 7.4* 6.9* 7.8* 7.5* 9.2*  HCT 29.8*   < > 23.9*   < > 25.0* 23.5* 20.3* 23.1* 22.1* 27.6*  MCV 80.5  --  81.0   < > 79.4* 81.0 78.1* 78.6* 78.6* 80.2  PLT 334  --  283   < >  355 348 346 345 355 367   < > = values in this interval not displayed.    Basic Metabolic Panel: Recent Labs  Lab 05/15/24 0429 05/15/24 1215 05/16/24 0334 05/17/24 0556 05/18/24 0416 05/19/24 0429 05/20/24 0428  NA 129*   < > 137 138 135 135 137  K 4.3   < > 3.6 3.2* 3.0* 3.4* 3.8  CL 92*   < > 103 106 103 102 103  CO2 20*   < > 19* 19* 22 22 22   GLUCOSE 470*   < > 219* 167* 153* 186* 176*  BUN 46*   < > 36* 18 9 6* 6*  CREATININE 1.30*   < > 1.26* 1.03* 0.87 0.86 0.84  CALCIUM  7.5*   < > 8.3* 7.7* 7.9* 8.0* 8.3*  MG 2.3  --   --   --  1.9 1.9  --   PHOS 3.7  --   --   --   --   --   --    < > = values in this interval not displayed.    GFR: Estimated Creatinine Clearance: 88.5 mL/min (by C-G formula based on SCr of 0.84 mg/dL).  Liver Function Tests: Recent Labs  Lab 05/14/24 1940 05/15/24 0429 05/17/24 0556  AST 34 19 14*  ALT 28 20 13   ALKPHOS 73 56 53  BILITOT 0.9 0.5 0.6  PROT 7.6 6.7 6.2*  ALBUMIN 2.2* 2.3* 2.0*   No results for input(s): LIPASE, AMYLASE in the last 168 hours. Recent Labs  Lab 05/14/24 1940  AMMONIA 28    Coagulation Profile: No results for input(s): INR, PROTIME in the last 168 hours.  Cardiac Enzymes: No results for  input(s): CKTOTAL, CKMB, CKMBINDEX, TROPONINI in the last 168 hours.  BNP (last 3 results) No results for input(s): PROBNP in the last 8760 hours.  Lipid Profile: No results for input(s): CHOL, HDL, LDLCALC, TRIG, CHOLHDL, LDLDIRECT in the last 72 hours.  Thyroid  Function Tests: No results for input(s): TSH, T4TOTAL, FREET4, T3FREE, THYROIDAB in the last 72 hours.  Anemia Panel: No results for input(s): VITAMINB12, FOLATE, FERRITIN, TIBC, IRON, RETICCTPCT in the last 72 hours.   Urine analysis:    Component Value Date/Time   COLORURINE YELLOW 05/15/2024 1336   APPEARANCEUR CLEAR 05/15/2024 1336   LABSPEC 1.025 05/15/2024 1336   PHURINE 6.0 05/15/2024 1336   GLUCOSEU NEGATIVE 05/15/2024 1336   HGBUR MODERATE (A) 05/15/2024 1336   BILIRUBINUR NEGATIVE 05/15/2024 1336   BILIRUBINUR negative 12/08/2020 1009   BILIRUBINUR negative 05/04/2020 1349   KETONESUR NEGATIVE 05/15/2024 1336   PROTEINUR NEGATIVE 05/15/2024 1336   UROBILINOGEN 0.2 12/08/2020 1009   UROBILINOGEN 0.2 09/12/2013 1642   NITRITE POSITIVE (A) 05/15/2024 1336   LEUKOCYTESUR SMALL (A) 05/15/2024 1336    Sepsis Labs: Lactic Acid, Venous    Component Value Date/Time   LATICACIDVEN 1.5 05/16/2024 0334    MICROBIOLOGY: Recent Results (from the past 240 hours)  Blood Culture (routine x 2)     Status: None   Collection Time: 05/14/24  7:40 PM   Specimen: BLOOD LEFT ARM  Result Value Ref Range Status   Specimen Description BLOOD LEFT ARM  Final   Special Requests   Final    BOTTLES DRAWN AEROBIC AND ANAEROBIC Blood Culture adequate volume   Culture   Final    NO GROWTH 5 DAYS Performed at [facility], [address] [place], [place] [address]    Report Status 05/19/2024 FINAL  Final  Blood Culture (routine x 2)     Status: None   Collection Time: 05/14/24  7:41 PM   Specimen: BLOOD RIGHT ARM  Result Value Ref Range Status   Specimen Description BLOOD  RIGHT ARM  Final   Special Requests   Final    BOTTLES DRAWN AEROBIC AND ANAEROBIC Blood Culture adequate volume   Culture   Final    NO GROWTH 5 DAYS Performed at [facility], [address], [place], [place] [address]    Report Status 05/19/2024 FINAL  Final    RADIOLOGY STUDIES/RESULTS: No results found.    LOS: 6 days   Donalda Applebaum, MD  [facility]    To contact the attending provider between 7A-7P or the covering provider during after hours 7P-7A, please log into the web site [email or web address] and access using universal [place] password for that web site. If you do not have the password, please call the hospital operator.  05/20/2024, 10:35 AM

## 2024-05-20 NOTE — Plan of Care (Signed)

## 2024-05-20 NOTE — Progress Notes (Signed)
 Rounding Note    Patient Name: Rhonda Hickman Date of Encounter: 05/20/2024  [facility] Cardiologist: Lonni LITTIE [name], MD  Chief Complaint: Altered mental status Reason of consult: Atrial fibrillation with rapid ventricular rates  Subjective   Patient seen at bedside  No new concerns - just wants to go home.   Inpatient Medications    Scheduled Meds:  acetaminophen   1,000 mg Oral Q8H   amiodarone  200 mg Oral BID   buPROPion   150 mg Oral BID   divalproex   500 mg Oral Daily   And   divalproex   1,000 mg Oral Q2200   ferrous sulfate   325 mg Oral BID WC   fluticasone  furoate-vilanterol  1 puff Inhalation Daily   insulin  aspart  0-9 Units Subcutaneous Q4H   metoprolol  tartrate  25 mg Oral BID   rivaroxaban   20 mg Oral Q supper   rosuvastatin   10 mg Oral Daily   Continuous Infusions:  PRN Meds: hydrALAZINE , ipratropium-albuterol , ondansetron  (ZOFRAN ) IV, oxyCODONE    Vital Signs    Vitals:   05/20/24 0440 05/20/24 0801 05/20/24 0931 05/20/24 1211  BP:  (!) 150/66 (!) 150/66 (!) 150/62  Pulse:  67 83   Resp: 13 17    Temp: 97.9 F (36.6 C) 98.7 F (37.1 C)  98.7 F (37.1 C)  TempSrc:  Oral  Oral  SpO2:    95%  Weight:      Height:        Intake/Output Summary (Last 24 hours) at 05/20/2024 1624 Last data filed at 05/20/2024 0802 Gross per 24 hour  Intake 120 ml  Output 1750 ml  Net -1630 ml      05/14/2024    7:21 PM 05/10/2024    7:09 AM 05/06/2024    1:21 PM  Last 3 Weights  Weight (lbs) 233 lb 11 oz 233 lb 11 oz 233 lb 11 oz  Weight (kg) 106 kg 106 kg 106 kg      Telemetry    SR  - Personally Reviewed  ECG    No new tracing - Personally Reviewed  Physical Exam   Physical Exam  Constitutional: No distress.  hemodynamically stable  Neck: No JVD present.  Cardiovascular: Normal rate, regular rhythm, S1 normal and S2 normal. Exam reveals no gallop, no S3 and no S4.  No murmur heard. Pulmonary/Chest: Effort normal and  breath sounds normal. No stridor. She has no wheezes. She has no rales.  Musculoskeletal:        General: No edema.     Cervical back: Neck supple.  Skin: Skin is warm.    Labs    High Sensitivity Troponin:  No results for input(s): TROPONINIHS in the last 720 hours.   Chemistry Recent Labs  Lab 05/14/24 1940 05/14/24 1953 05/15/24 0429 05/15/24 1215 05/17/24 0556 05/18/24 0416 05/19/24 0429 05/20/24 0428  NA 132*   < > 129*   < > 138 135 135 137  K 5.9*   < > 4.3   < > 3.2* 3.0* 3.4* 3.8  CL 96*  --  92*   < > 106 103 102 103  CO2 20*  --  20*   < > 19* 22 22 22   GLUCOSE 110*  --  470*   < > 167* 153* 186* 176*  BUN 64*  --  46*   < > 18 9 6* 6*  CREATININE 1.64*  --  1.30*   < > 1.03* 0.87 0.86 0.84  CALCIUM   8.2*  --  7.5*   < > 7.7* 7.9* 8.0* 8.3*  MG  --   --  2.3  --   --  1.9 1.9  --   PROT 7.6  --  6.7  --  6.2*  --   --   --   ALBUMIN 2.2*  --  2.3*  --  2.0*  --   --   --   AST 34  --  19  --  14*  --   --   --   ALT 28  --  20  --  13  --   --   --   ALKPHOS 73  --  56  --  53  --   --   --   BILITOT 0.9  --  0.5  --  0.6  --   --   --   GFRNONAA 34*  --  45*   < > 60* >60 >60 >60  ANIONGAP 16*  --  17*   < > 13 10 11 12    < > = values in this interval not displayed.    Lipids No results for input(s): CHOL, TRIG, HDL, LABVLDL, LDLCALC, CHOLHDL in the last 168 hours.  Hematology Recent Labs  Lab 05/18/24 1636 05/19/24 0429 05/20/24 0428  WBC 14.2* 13.2* 15.2*  RBC 2.94* 2.81* 3.44*  HGB 7.8* 7.5* 9.2*  HCT 23.1* 22.1* 27.6*  MCV 78.6* 78.6* 80.2  MCH 26.5 26.7 26.7  MCHC 33.8 33.9 33.3  RDW 18.0* 17.6* 17.6*  PLT 345 355 367   Thyroid  No results for input(s): TSH, FREET4 in the last 168 hours.  BNPNo results for input(s): BNP, PROBNP in the last 168 hours.  DDimer No results for input(s): DDIMER in the last 168 hours.    Cardiac Studies   Echocardiogram, 08/26/2022 Left ventricular ejection fraction, by estimation, is 55  to 60% . The left ventricle has normal function. The left ventricle has no regional wall motion abnormalities. There is mild concentric left ventricular hypertrophy. Left ventricular diastolic parameters are consistent with Grade I diastolic dysfunction ( impaired relaxation) .  Right ventricular systolic function is normal. The right ventricular size is normal.  Left atrial size was mildly dilated.  The mitral valve is normal in structure. Trivial mitral valve regurgitation. No evidence of mitral stenosis.  The aortic valve is tricuspid. Aortic valve regurgitation is not visualized. No aortic stenosis is present.  The inferior vena cava is normal in size with greater than 50% respiratory variability, suggesting right atrial pressure of 3 mmHg.   Coronary CTA, 11/03/2022 Calcium  score 54.5 which is 80 th percentile for age/sex Normal ascending thoracic aorta 3.6 cm CAD RADS 1 non obstructive CAD see description above Lipomatous hypertrophy of the atrial septum   Patient Profile     68 y.o. female hx of TIA, paroxysmal atrial fibrillation, BPD, COPD, T2DM, asthma, hyperlipidemia, tobacco use, mild OSA no indication for CPAP, hypertension   Assessment & Plan    Persistent atrial fibrillation/flutter Noted to be in atrial flutter with RVR during his hospitalization Was placed on IV amiodarone for rate and rhythm control strategy. Now transition to p.o. amiodarone. Patient's rhythm is back to sinus rhythm. Continue Lopressor  25 mg p.o. twice daily. Currently on amiodarone 200 mg p.o. twice daily, recommend 7-day course followed by 200 mg p.o. daily until follow-up.  Would avoid long-term amiodarone use if possible given his side effect profile.  Patient Oral anticoagulation was held secondary  to anemia requiring PRBCs. Spoke to attending physician via secure chat as well as review of electronic medical records it is felt that her anemia is likely secondary to recent orthopedic surgery.  No  obvious underlying etiology. Patient is restarted on Xarelto  later tonight for thromboembolic prophylaxis. As long as hemoglobin remains stable likely could be discharged home soon. High CHA2DS2-VASc score.  Small bowel obstruction: New during this hospitalization Not tolerating a diet  Coronary calcification Hyperlipidemia: Nonobstructive disease, CAD RADS 1, total CAC 54. Asymptomatic. Reemphasized importance of improving her modifiable cardiovascular risk factors. Restarted Crestor  10 mg p.o. daily since SBO is resolved.  Anemia: Has received 2 units of PRBCs.  For questions or updates, please contact [facility] Please consult [email or web address] for contact info under     Signed, Madonna Large, DO, [facility]  A Division of [facility] [address], [place], [place] [address]  Pager: [phone] Office: [phone] 05/20/2024, 4:24 PM

## 2024-05-20 NOTE — Care Management Important Message (Signed)
 Important Message  Patient Details  Name: Rhonda Hickman MRN: [id number] Date of Birth: [birth year]   Important Message Given:  Yes - Medicare IM     Claretta Deed 05/20/2024, 3:14 PM

## 2024-05-20 NOTE — TOC Progression Note (Addendum)
 Transition of Care [facility]) - Progression Note    Patient Details  Name: Rhonda Hickman MRN: [id number] Date of Birth: 02/25/1956  [facility] CM/SW Contact  [name], LCSW Phone Number: 05/20/2024, 8:11 AM  Clinical Narrative:    8:11 AM-Insurance requesting an updated MD progress note stating stability. Will follow up.   12:35 PM-Uploaded updated MD note to insurance as requested.   4:12 PM-CSW has received insurance approval for [name], Ref# [id number], Auth ID# [id number], effective 05/20/2024-05/22/2024 (11/13 at 11:59pm). However, [name] is awaiting a bed to open.  Left vm for patient's sister, [name].   Expected Discharge Plan: Skilled Nursing Facility Barriers to Discharge: Continued Medical Work up, English As A Second Language Teacher               Expected Discharge Plan and Services In-house Referral: Clinical Social Work   Post Acute Care Choice: Skilled Nursing Facility Living arrangements for the past 2 months: Apartment                                       Social Drivers of Health (SDOH) Interventions SDOH Screenings   Food Insecurity: No Food Insecurity (05/10/2024)  Housing: Low Risk  (05/10/2024)  Transportation Needs: No Transportation Needs (05/10/2024)  Utilities: Not At Risk (05/10/2024)  Depression (PHQ2-9): Low Risk  (03/29/2024)  Social Connections: Patient Unable To Answer (05/10/2024)  Tobacco Use: High Risk (05/10/2024)    Readmission Risk Interventions     No data to display

## 2024-05-20 NOTE — Progress Notes (Signed)
 Occupational Therapy Treatment Patient Details Name: Rhonda Hickman MRN: [id number] DOB: 06-04-1956 Today's Date: 05/20/2024   History of present illness 68 y.o. female presents to [facility] 05/14/24 from SNF due to hypersomnolence and tachycardia. Pt with suspicion for distal SBO. Of note, pt d/c from hospital 11/4 after R TKA. PMHx:  severe OA status post left total knee replacement (06/20/23), obesity, COPD, type 2 diabetes, hypertension, hyperlipidemia, paroxysmal A-fib on Xarelto , CKD 3B, TIA, CVA, neuropathy, vertigo   OT comments  Pt progressing toward established OT goals. Pt premedicated for session and reports that she is feeling it, noted to be very labile during session, initially irritated with PT/OT, then thankful; inconsistent throughout. Pt also with inappropriate comments toward friend in room. Pt needing direct simple commands to stay on task as well as for optimal sequence during mobility. Ultimately needing min-mod A for bed mobility this session and max A +2 for STS transfers x2. Pt continues to benefit from skilled OT services acutely, and recommending inpatient rehab <3 hours upon discharge.       If plan is discharge home, recommend the following:  Two people to help with walking and/or transfers;Two people to help with bathing/dressing/bathroom;Assistance with cooking/housework;Direct supervision/assist for medications management;Assist for transportation;Direct supervision/assist for financial management;Help with stairs or ramp for entrance;Assistance with feeding   Equipment Recommendations  Other (comment) (defer)    Recommendations for Other Services      Precautions / Restrictions Precautions Precautions: Fall Recall of Precautions/Restrictions: Impaired Precaution/Restrictions Comments: tachycardia, R TKA on 10/31 Required Braces or Orthoses:  (no KI needed per Dr. Vernetta 11/7) Restrictions Weight Bearing Restrictions Per Provider Order: Yes RLE Weight Bearing  Per Provider Order: Weight bearing as tolerated       Mobility Bed Mobility Overal bed mobility: Needs Assistance Bed Mobility: Supine to Sit, Sit to Supine     Supine to sit: Min assist, Used rails (cues for attention to task, optimizing technique) Sit to supine: Mod assist, Used rails   General bed mobility comments: Min A with cues for attention to task and sequencing to come to EOB. mod A for return to supine for assist for trunk guidance and RLE    Transfers Overall transfer level: Needs assistance Equipment used: Rolling walker (2 wheels) Transfers: Sit to/from Stand Sit to Stand: Max assist, +2 physical assistance, +2 safety/equipment           General transfer comment: up from EOB x2 max A to rise; able to clear hips from EOB, but poor hip/knee extension standing in flexed/stooped position. pt with poor safety awareness throughout as well as poor insight into current functional status.     Balance Overall balance assessment: Needs assistance Sitting-balance support: Single extremity supported, Bilateral upper extremity supported, Feet supported Sitting balance-Leahy Scale: Poor     Standing balance support: Bilateral upper extremity supported Standing balance-Leahy Scale: Zero Standing balance comment: unable to come to full stand at EOB with +2 A                           ADL either performed or assessed with clinical judgement   ADL Overall ADL's : Needs assistance/impaired     Grooming: Set up;Sitting                   Toilet Transfer: Maximal assistance;+2 for physical assistance;+2 for safety/equipment Toilet Transfer Details (indicate cue type and reason): STS         Functional mobility  during ADLs: Maximal assistance;+2 for safety/equipment;+2 for physical assistance      Extremity/Trunk Assessment Upper Extremity Assessment Upper Extremity Assessment: Generalized weakness   Lower Extremity Assessment Lower Extremity  Assessment: Defer to PT evaluation        Vision       Perception     Praxis     Communication Communication Communication: Impaired Factors Affecting Communication: Reduced clarity of speech;Difficulty expressing self (intermittent; s/p oxycodone  before start of session)   Cognition Arousal: Alert Behavior During Therapy: Lability Cognition: Cognition impaired     Awareness: Intellectual awareness intact, Online awareness impaired Memory impairment (select all impairments): Short-term memory Attention impairment (select first level of impairment): Focused attention, Sustained attention Executive functioning impairment (select all impairments): Organization, Sequencing, Problem solving, Reasoning, Initiation OT - Cognition Comments: pt benefits from increased time for sequencing mobility and BADL. internally distracted and self driven. Benefits from direct simple commands during mobility to optimize technique. poor insight into current functional status                 Following commands: Impaired Following commands impaired: Follows one step commands inconsistently, Follows one step commands with increased time      Cueing   Cueing Techniques: Tactile cues, Gestural cues, Verbal cues, Visual cues  Exercises      Shoulder Instructions       General Comments      Pertinent Vitals/ Pain       Pain Assessment Pain Assessment: Faces Faces Pain Scale: Hurts little more Pain Location: R LE with movement Pain Descriptors / Indicators: Grimacing, Guarding, Moaning, Sore Pain Intervention(s): Limited activity within patient's tolerance, Monitored during session  Home Living                                          Prior Functioning/Environment              Frequency  Min 2X/week        Progress Toward Goals  OT Goals(current goals can now be found in the care plan section)  Progress towards OT goals: Progressing toward  goals  Acute Rehab OT Goals Patient Stated Goal: go home OT Goal Formulation: With patient Time For Goal Achievement: 05/29/24 Potential to Achieve Goals: Fair ADL Goals Pt Will Perform Grooming: sitting;with supervision Pt Will Transfer to Toilet: with mod assist;with +2 assist;stand pivot transfer Additional ADL Goal #1: pt will follow two step commands min cues to optimize functional participation in BADL Additional ADL Goal #2: pt will perform bed mobility with mod A Additional ADL Goal #3: pt will sit EOB while reaching outside BOS with CGA without LOB 4/5 attempts  Plan      Co-evaluation                 AM-PAC OT 6 Clicks Daily Activity     Outcome Measure   Help from another person eating meals?: Total Help from another person taking care of personal grooming?: A Lot Help from another person toileting, which includes using toliet, bedpan, or urinal?: Total Help from another person bathing (including washing, rinsing, drying)?: A Lot Help from another person to put on and taking off regular upper body clothing?: A Lot Help from another person to put on and taking off regular lower body clothing?: Total 6 Click Score: 9    End of Session Equipment Utilized During Treatment:  Gait belt;Rolling walker (2 wheels)  OT Visit Diagnosis: Unsteadiness on feet (R26.81);Muscle weakness (generalized) (M62.81);Other symptoms and signs involving cognitive function;Pain   Activity Tolerance Patient tolerated treatment well   Patient Left in bed;with call bell/phone within reach;with family/visitor present;with bed alarm set   Nurse Communication Mobility status        Time: 9148-9079 OT Time Calculation (min): 29 min  Charges: OT General Charges $OT Visit: 1 Visit OT Treatments $Therapeutic Activity: 8-22 mins  Elma JONETTA Penner, OTD, OTR/L [facility] Acute Rehabilitation Office: [phone]   Elma JONETTA Penner 05/20/2024, 11:44 AM

## 2024-05-20 NOTE — Progress Notes (Signed)
 Physical Therapy Treatment Patient Details Name: Rhonda Hickman MRN: [id number] DOB: May 03, 1956 Today's Date: 05/20/2024   History of Present Illness 68 y.o. female presents to [facility]/14/[facility] from SNF due to hypersomnolence and tachycardia. Pt with suspicion for distal SBO. Of note, pt d/c from hospital 11/4 after R TKA. PMHx:  severe OA status post left total knee replacement (06/20/23), obesity, COPD, type 2 diabetes, hypertension, hyperlipidemia, paroxysmal A-fib on Xarelto , CKD 3B, TIA, CVA, neuropathy, vertigo    PT Comments  The pt was able to progress to standing 2x with maxAx2 and RW support today, demonstrating improved lower extremity activation/initiation along with improved R knee ROM (sits with knee flexed to ~80'). However, pt was noted to be labile, alternating between being calm and agreeable to yelling at her friend or towards therapists. She continues to display poor awareness and insight into her deficits and safety, stating she could walk her own to the bathroom yet could hardly stand < 30 seconds with a RW and maxAx2 this date. Will continue to follow acutely.    If plan is discharge home, recommend the following: Two people to help with walking and/or transfers;Two people to help with bathing/dressing/bathroom;Assistance with cooking/housework;Assist for transportation;Help with stairs or ramp for entrance   Can travel by private vehicle     No  Equipment Recommendations  Wheelchair (measurements PT);Wheelchair cushion (measurements PT);Hospital bed;Hoyer lift    Recommendations for Other Services       Precautions / Restrictions Precautions Precautions: Fall Recall of Precautions/Restrictions: Impaired Precaution/Restrictions Comments: tachycardia, R TKA on 10/31 Required Braces or Orthoses:  (no KI needed per Dr. Vernetta 11/7) Restrictions Weight Bearing Restrictions Per Provider Order: Yes RLE Weight Bearing Per Provider Order: Weight bearing as tolerated      Mobility  Bed Mobility Overal bed mobility: Needs Assistance Bed Mobility: Supine to Sit, Sit to Supine     Supine to sit: Min assist, Used rails (cues for attention to task, optimizing technique) Sit to supine: Mod assist, Used rails   General bed mobility comments: Min A with cues for attention to task and sequencing to come to EOB. mod A for return to supine for assist for trunk guidance and RLE    Transfers Overall transfer level: Needs assistance Equipment used: Rolling walker (2 wheels) Transfers: Sit to/from Stand Sit to Stand: Max assist, +2 physical assistance, +2 safety/equipment           General transfer comment: up from EOB x2 max A to rise; able to clear hips from EOB, but poor hip/knee extension standing in flexed/stooped position. pt with poor safety awareness throughout as well as poor insight into current functional status.    Ambulation/Gait               General Gait Details: unable, pt only able to stand with a flexed posture < 30 seconds before returning herself to sitting   Stairs             Wheelchair Mobility     Tilt Bed    Modified Rankin (Stroke Patients Only)       Balance Overall balance assessment: Needs assistance Sitting-balance support: Single extremity supported, Bilateral upper extremity supported, Feet supported Sitting balance-Leahy Scale: Poor Sitting balance - Comments: x1 LOB to L when sitting statically EOB, pt varied in needing CGA-minA for balance   Standing balance support: Bilateral upper extremity supported Standing balance-Leahy Scale: Poor Standing balance comment: unable to come to full stand at EOB with +2 A, maintaining  a flexed posture despite cues to extend hips and knees and push through RW                            Communication Communication Communication: Impaired Factors Affecting Communication: Reduced clarity of speech;Difficulty expressing self (intermittent; s/p  oxycodone  before start of session)  Cognition Arousal: Alert Behavior During Therapy: Lability   PT - Cognitive impairments: Awareness, Attention, Initiation, Sequencing, Problem solving, Safety/Judgement, Memory                       PT - Cognition Comments: Pt varied greatly in personality/mood, one moment being optimistic, another getting angry and cussing at friend and therapists, then again being calm and apologizing. Pt with unrealistic expectations and unaware of her deficits, stating she can walk herself to the bathroom yet could not stand without +2 assist. Pt also falling over to the L while sitting EOB but maintained her safety, yet yelled at her friend for not catching her. Needs redirecting at times. Following commands: Impaired Following commands impaired: Follows one step commands inconsistently, Follows one step commands with increased time    Cueing Cueing Techniques: Tactile cues, Gestural cues, Verbal cues, Visual cues  Exercises Total Joint Exercises Quad Sets: AROM, Strengthening, 10 reps, Both, Supine Heel Slides: AROM, Strengthening, Both, 10 reps, Supine    General Comments        Pertinent Vitals/Pain Pain Assessment Pain Assessment: Faces Faces Pain Scale: Hurts little more Pain Location: R LE with movement Pain Descriptors / Indicators: Grimacing, Guarding, Moaning, Sore Pain Intervention(s): Limited activity within patient's tolerance, Monitored during session, Premedicated before session, Repositioned    Home Living                          Prior Function            PT Goals (current goals can now be found in the care plan section) Acute Rehab PT Goals Patient Stated Goal: to improve PT Goal Formulation: With patient/family Time For Goal Achievement: 05/29/24 Potential to Achieve Goals: Fair Progress towards PT goals: Progressing toward goals    Frequency    Min 2X/week      PT Plan      Co-evaluation   Reason  for Co-Treatment: Necessary to address cognition/behavior during functional activity;For patient/therapist safety;To address functional/ADL transfers PT goals addressed during session: Mobility/safety with mobility;Balance;Proper use of DME;Strengthening/ROM OT goals addressed during session: ADL's and self-care      AM-PAC PT 6 Clicks Mobility   Outcome Measure  Help needed turning from your back to your side while in a flat bed without using bedrails?: A Little Help needed moving from lying on your back to sitting on the side of a flat bed without using bedrails?: A Little Help needed moving to and from a bed to a chair (including a wheelchair)?: Total Help needed standing up from a chair using your arms (e.g., wheelchair or bedside chair)?: Total Help needed to walk in hospital room?: Total Help needed climbing 3-5 steps with a railing? : Total 6 Click Score: 10    End of Session Equipment Utilized During Treatment: Gait belt Activity Tolerance: Patient limited by pain;Other (comment) (limited by pt's behavior) Patient left: in bed;with call bell/phone within reach;with bed alarm set;with family/visitor present Nurse Communication: Mobility status PT Visit Diagnosis: Unsteadiness on feet (R26.81);History of falling (Z91.81);Muscle weakness (generalized) (M62.81);Difficulty in  walking, not elsewhere classified (R26.2);Pain Pain - Right/Left: Right Pain - part of body: Knee     Time: 0850-0921 PT Time Calculation (min) (ACUTE ONLY): 31 min  Charges:    $Therapeutic Activity: 8-22 mins PT General Charges $$ ACUTE PT VISIT: 1 Visit                     Theo Ferretti, PT, DPT Acute Rehabilitation Services  Office: [phone]    Theo CHRISTELLA Ferretti 05/20/2024, 12:06 PM

## 2024-05-21 ENCOUNTER — Inpatient Hospital Stay (HOSPITAL_COMMUNITY)

## 2024-05-21 ENCOUNTER — Ambulatory Visit (INDEPENDENT_AMBULATORY_CARE_PROVIDER_SITE_OTHER)

## 2024-05-21 DIAGNOSIS — D649 Anemia, unspecified: Secondary | ICD-10-CM | POA: Diagnosis not present

## 2024-05-21 DIAGNOSIS — Z79899 Other long term (current) drug therapy: Secondary | ICD-10-CM

## 2024-05-21 DIAGNOSIS — I483 Typical atrial flutter: Secondary | ICD-10-CM | POA: Diagnosis not present

## 2024-05-21 DIAGNOSIS — Z7901 Long term (current) use of anticoagulants: Secondary | ICD-10-CM | POA: Diagnosis not present

## 2024-05-21 DIAGNOSIS — I4891 Unspecified atrial fibrillation: Secondary | ICD-10-CM | POA: Diagnosis not present

## 2024-05-21 DIAGNOSIS — K56609 Unspecified intestinal obstruction, unspecified as to partial versus complete obstruction: Secondary | ICD-10-CM | POA: Diagnosis not present

## 2024-05-21 LAB — CBC
HCT: 27.6 % — ABNORMAL LOW (ref 36.0–46.0)
Hemoglobin: 9.4 g/dL — ABNORMAL LOW (ref 12.0–15.0)
MCH: 27.2 pg (ref 26.0–34.0)
MCHC: 34.1 g/dL (ref 30.0–36.0)
MCV: 79.8 fL — ABNORMAL LOW (ref 80.0–100.0)
Platelets: 379 K/uL (ref 150–400)
RBC: 3.46 MIL/uL — ABNORMAL LOW (ref 3.87–5.11)
RDW: 18.1 % — ABNORMAL HIGH (ref 11.5–15.5)
WBC: 16.8 K/uL — ABNORMAL HIGH (ref 4.0–10.5)
nRBC: 0.7 % — ABNORMAL HIGH (ref 0.0–0.2)

## 2024-05-21 LAB — GLUCOSE, CAPILLARY
Glucose-Capillary: 179 mg/dL — ABNORMAL HIGH (ref 70–99)
Glucose-Capillary: 177 mg/dL — ABNORMAL HIGH (ref 70–99)
Glucose-Capillary: 159 mg/dL — ABNORMAL HIGH (ref 70–99)
Glucose-Capillary: 216 mg/dL — ABNORMAL HIGH (ref 70–99)
Glucose-Capillary: 219 mg/dL — ABNORMAL HIGH (ref 70–99)

## 2024-05-21 LAB — URINALYSIS, ROUTINE W REFLEX MICROSCOPIC
Bilirubin Urine: NEGATIVE
Glucose, UA: NEGATIVE mg/dL
Hgb urine dipstick: NEGATIVE
Ketones, ur: NEGATIVE mg/dL
Leukocytes,Ua: NEGATIVE
Nitrite: NEGATIVE
Protein, ur: NEGATIVE mg/dL
Specific Gravity, Urine: 1.006 (ref 1.005–1.030)
pH: 6 (ref 5.0–8.0)

## 2024-05-21 NOTE — Telephone Encounter (Signed)
 Requested Prescriptions  Pending Prescriptions Disp Refills   amLODipine  (NORVASC ) 5 MG tablet [Pharmacy Med Name: amLODIPine  Besylate 5 MG Oral Tablet] 100 tablet 2    Sig: TAKE 1 TABLET BY MOUTH DAILY     Cardiovascular: Calcium  Channel Blockers 2 Passed - 05/21/2024  2:22 PM      Passed - Last BP in normal range    BP Readings from Last 1 Encounters:  05/21/24 134/61         Passed - Last Heart Rate in normal range    Pulse Readings from Last 1 Encounters:  05/21/24 61         Passed - Valid encounter within last 6 months    Recent Outpatient Visits           2 weeks ago Urinary incontinence without sensory awareness   [place] [facility] Celestia Rosaline SQUIBB, NP   2 months ago Chronic obstructive pulmonary disease, unspecified (HCC)   [place] [facility] Celestia Rosaline SQUIBB, NP   6 months ago Chondritis of auricle, bilateral   [place] [facility] Celestia Rosaline SQUIBB, NP   6 months ago Otalgia of both ears   [place] [facility] Celestia Rosaline SQUIBB, NP   7 months ago Hospital discharge follow-up   [place] [facility] Celestia Rosaline SQUIBB, NP       Future Appointments             In 2 months Kate Lonni CROME, MD [facility], H&[facility]             oxybutynin  (DITROPAN ) 5 MG tablet [Pharmacy Med Name: Oxybutynin  Chloride 5 MG Oral Tablet] 300 tablet 2    Sig: TAKE 1 TABLET BY MOUTH 2 TO 3  TIMES DAILY FOR BLADDER SPASMS     Urology:  Bladder Agents Passed - 05/21/2024  2:22 PM      Passed - Valid encounter within last 12 months    Recent Outpatient Visits           2 weeks ago Urinary incontinence without sensory awareness   [place] [facility] Celestia Rosaline SQUIBB, NP   2 months ago Chronic obstructive pulmonary disease, unspecified (HCC)   [place] [facility] Celestia Rosaline SQUIBB, NP   6 months ago Chondritis of auricle, bilateral   [place] [facility] Celestia Rosaline SQUIBB, NP   6 months ago Otalgia of both ears   [place] [facility] Celestia Rosaline SQUIBB, NP   7 months ago Hospital discharge follow-up   [place] [facility] Celestia Rosaline SQUIBB, NP       Future Appointments             In 2 months Kate Lonni CROME, MD [facility] at [facility] Dept of [facility] [place] [facility], H&V

## 2024-05-21 NOTE — Discharge Instructions (Signed)

## 2024-05-21 NOTE — Plan of Care (Signed)
  Problem: Coping: Goal: Ability to adjust to condition or change in health will improve Outcome: Progressing   Problem: Skin Integrity: Goal: Risk for impaired skin integrity will decrease Outcome: Progressing   Problem: Activity: Goal: Risk for activity intolerance will decrease Outcome: Progressing   Problem: Pain Managment: Goal: General experience of comfort will improve and/or be controlled Outcome: Progressing

## 2024-05-21 NOTE — Progress Notes (Signed)
 Rounding Note    Patient Name: Rhonda Hickman Date of Encounter: 05/21/2024  [facility] Cardiologist: Lonni LITTIE [name], MD  Chief Complaint: Altered mental status Reason of consult: Atrial fibrillation with rapid ventricular rates  Subjective   No complaints overnight. Wishes to go home. Remains in sinus rhythm Denies bleeding  Inpatient Medications    Scheduled Meds:  acetaminophen   1,000 mg Oral Q8H   amiodarone  200 mg Oral BID   buPROPion   150 mg Oral BID   divalproex   500 mg Oral Daily   And   divalproex   1,000 mg Oral Q2200   ferrous sulfate   325 mg Oral BID WC   fluticasone  furoate-vilanterol  1 puff Inhalation Daily   insulin  aspart  0-9 Units Subcutaneous Q4H   metoprolol  tartrate  25 mg Oral BID   rivaroxaban   20 mg Oral Q supper   rosuvastatin   10 mg Oral Daily   Continuous Infusions:  PRN Meds: hydrALAZINE , ipratropium-albuterol , ondansetron  (ZOFRAN ) IV, oxyCODONE    Vital Signs    Vitals:   05/21/24 0400 05/21/24 0446 05/21/24 0802 05/21/24 1117  BP: (!) 168/69 (!) 153/79  134/61  Pulse: 61     Resp: (!) 22     Temp: 98.5 F (36.9 C)  98.3 F (36.8 C) 98.8 F (37.1 C)  TempSrc: Oral  Oral Oral  SpO2: 97%     Weight:      Height:        Intake/Output Summary (Last 24 hours) at 05/21/2024 1255 Last data filed at 05/21/2024 1118 Gross per 24 hour  Intake --  Output 3600 ml  Net -3600 ml      05/14/2024    7:21 PM 05/10/2024    7:09 AM 05/06/2024    1:21 PM  Last 3 Weights  Weight (lbs) 233 lb 11 oz 233 lb 11 oz 233 lb 11 oz  Weight (kg) 106 kg 106 kg 106 kg      Telemetry    SR  - Personally Reviewed  ECG    No new tracing - Personally Reviewed  Physical Exam   Physical Exam  Constitutional: No distress.  hemodynamically stable  Neck: No JVD present.  Cardiovascular: Normal rate, regular rhythm, S1 normal and S2 normal. Exam reveals no gallop, no S3 and no S4.  No murmur heard. Pulmonary/Chest:  Effort normal and breath sounds normal. No stridor. She has no wheezes. She has no rales.  Musculoskeletal:        General: No edema.     Cervical back: Neck supple.  Skin: Skin is warm.    Labs    High Sensitivity Troponin:  No results for input(s): TROPONINIHS in the last 720 hours.   Chemistry Recent Labs  Lab 05/14/24 1940 05/14/24 1953 05/15/24 0429 05/15/24 1215 05/17/24 0556 05/18/24 0416 05/19/24 0429 05/20/24 0428  NA 132*   < > 129*   < > 138 135 135 137  K 5.9*   < > 4.3   < > 3.2* 3.0* 3.4* 3.8  CL 96*  --  92*   < > 106 103 102 103  CO2 20*  --  20*   < > 19* 22 22 22   GLUCOSE 110*  --  470*   < > 167* 153* 186* 176*  BUN 64*  --  46*   < > 18 9 6* 6*  CREATININE 1.64*  --  1.30*   < > 1.03* 0.87 0.86 0.84  CALCIUM  8.2*  --  7.5*   < > 7.7* 7.9* 8.0* 8.3*  MG  --   --  2.3  --   --  1.9 1.9  --   PROT 7.6  --  6.7  --  6.2*  --   --   --   ALBUMIN 2.2*  --  2.3*  --  2.0*  --   --   --   AST 34  --  19  --  14*  --   --   --   ALT 28  --  20  --  13  --   --   --   ALKPHOS 73  --  56  --  53  --   --   --   BILITOT 0.9  --  0.5  --  0.6  --   --   --   GFRNONAA 34*  --  45*   < > 60* >60 >60 >60  ANIONGAP 16*  --  17*   < > 13 10 11 12    < > = values in this interval not displayed.    Lipids No results for input(s): CHOL, TRIG, HDL, LABVLDL, LDLCALC, CHOLHDL in the last 168 hours.  Hematology Recent Labs  Lab 05/19/24 0429 05/20/24 0428 05/21/24 0416  WBC 13.2* 15.2* 16.8*  RBC 2.81* 3.44* 3.46*  HGB 7.5* 9.2* 9.4*  HCT 22.1* 27.6* 27.6*  MCV 78.6* 80.2 79.8*  MCH 26.7 26.7 27.2  MCHC 33.9 33.3 34.1  RDW 17.6* 17.6* 18.1*  PLT 355 367 379   Thyroid  No results for input(s): TSH, FREET4 in the last 168 hours.  BNPNo results for input(s): BNP, PROBNP in the last 168 hours.  DDimer No results for input(s): DDIMER in the last 168 hours.    Cardiac Studies   Echocardiogram, 08/26/2022 Left ventricular ejection fraction, by  estimation, is 55 to 60% . The left ventricle has normal function. The left ventricle has no regional wall motion abnormalities. There is mild concentric left ventricular hypertrophy. Left ventricular diastolic parameters are consistent with Grade I diastolic dysfunction ( impaired relaxation) .  Right ventricular systolic function is normal. The right ventricular size is normal.  Left atrial size was mildly dilated.  The mitral valve is normal in structure. Trivial mitral valve regurgitation. No evidence of mitral stenosis.  The aortic valve is tricuspid. Aortic valve regurgitation is not visualized. No aortic stenosis is present.  The inferior vena cava is normal in size with greater than 50% respiratory variability, suggesting right atrial pressure of 3 mmHg.   Coronary CTA, 11/03/2022 Calcium  score 54.5 which is 80 th percentile for age/sex Normal ascending thoracic aorta 3.6 cm CAD RADS 1 non obstructive CAD see description above Lipomatous hypertrophy of the atrial septum   Patient Profile     68 y.o. female hx of TIA, paroxysmal atrial fibrillation, BPD, COPD, T2DM, asthma, hyperlipidemia, tobacco use, mild OSA no indication for CPAP, hypertension   Assessment & Plan    Persistent atrial fibrillation/flutter Noted to be in atrial flutter with RVR during his hospitalization Was placed on IV amiodarone for rate and rhythm control strategy. Now transition to p.o. amiodarone. Patient's rhythm is back to sinus rhythm. Continue Lopressor  25 mg p.o. twice daily. Currently on amiodarone 200 mg p.o. twice daily, recommend 7-day course followed by 200 mg p.o. daily (likely 30days) until follow-up in [facility].  Would avoid long-term amiodarone use if possible given his side effect profile.   Will arrange [facility]  appt in 30days to discuss the need for longterm amiodarone and re-evaluate Hb and rhythm.  Has an appt with primary cardiologist in January 2026 Oral anticoagulation was held  secondary to anemia requiring PRBCs. Discussed care with attending physician on 05/20/2024 it was felt that her anemia is likely secondary to recent orthopedic surgery.  No obvious underlying etiology. Patient was restarted on Xarelto  20mg  po q Supper on 05/20/2024. Hb this morning remains stable and patient denies any bleeding.   Small bowel obstruction: New during this hospitalization Now tolerating a diet Per primary   Coronary calcification Hyperlipidemia: Nonobstructive disease, CAD RADS 1, total CAC 54. Asymptomatic. Reemphasized importance of improving her modifiable cardiovascular risk factors. Restarted Crestor  10 mg p.o. daily since SBO is resolved.  Anemia: Has received 2 units of PRBCs.  Cardiology will sign off for now reach out if questions or concerns arise.  Discussed plan with attending physician and patient / sister.   For questions or updates, please contact [facility] Please consult [email or web address] for contact info under     Signed, Madonna Large, DO, [facility]  [facility]  A Division of [facility] [address] [place], [place] [address]  Pager: [phone] Office: [phone] 05/21/2024, 12:55 PM

## 2024-05-21 NOTE — Plan of Care (Signed)
   Problem: Skin Integrity: Goal: Risk for impaired skin integrity will decrease Outcome: Progressing   Problem: Activity: Goal: Risk for activity intolerance will decrease Outcome: Progressing   Problem: Pain Managment: Goal: General experience of comfort will improve and/or be controlled Outcome: Progressing   Problem: Safety: Goal: Ability to remain free from injury will improve Outcome: Progressing

## 2024-05-21 NOTE — Progress Notes (Signed)
 Patient ID: Rhonda Hickman, female   DOB: 1955-07-13, 68 y.o.   MRN: [id number] I came by the bedside to check on the patient's right knee given her increased white blood cell count.  A right total knee replacement was performed on 05/10/2024.  I did change the dressing on her right knee and it is clean and dry.  There was no redness or drainage.  There is even some slight wrinkling of the skin showing some decreased swelling postoperative.  She did let me gently flex and extend her knee on the right side with no increased pain.  From a standpoint of an infection, it looks like the right knee is stable thus far postoperatively.

## 2024-05-21 NOTE — TOC Progression Note (Signed)
 Transition of Care [facility]) - Progression Note    Patient Details  Name: Rhonda Hickman MRN: [id number] Date of Birth: February 12, 1956  Transition of Care [facility]) CM/SW Contact  [name], LCSW Phone Number: 05/21/2024, 10:56 AM  Clinical Narrative:    [name] still doesn't have a bed. Will continue to follow.    Expected Discharge Plan: Skilled Nursing Facility Barriers to Discharge: Continued Medical Work up, SNF Pending bed offer               Expected Discharge Plan and Services In-house Referral: Clinical Social Work   Post Acute Care Choice: Skilled Nursing Facility Living arrangements for the past 2 months: Apartment                                       Social Drivers of Health (SDOH) Interventions SDOH Screenings   Food Insecurity: No Food Insecurity (05/10/2024)  Housing: Low Risk  (05/10/2024)  Transportation Needs: No Transportation Needs (05/10/2024)  Utilities: Not At Risk (05/10/2024)  Depression (PHQ2-9): Low Risk  (03/29/2024)  Social Connections: Patient Unable To Answer (05/10/2024)  Tobacco Use: High Risk (05/10/2024)    Readmission Risk Interventions     No data to display

## 2024-05-21 NOTE — Telephone Encounter (Signed)
 Requested medications are due for refill today.  unsure  Requested medications are on the active medications list.  yes  Last refill. 03/12/2024  Future visit scheduled.   no  Notes to clinic.  Rx signed by a historical provider. Dr. Cicero listed as pcp.    Requested Prescriptions  Pending Prescriptions Disp Refills   amLODipine  (NORVASC ) 5 MG tablet [Pharmacy Med Name: amLODIPine  Besylate 5 MG Oral Tablet] 100 tablet 2    Sig: TAKE 1 TABLET BY MOUTH DAILY     Cardiovascular: Calcium  Channel Blockers 2 Passed - 05/21/2024  2:23 PM      Passed - Last BP in normal range    BP Readings from Last 1 Encounters:  05/21/24 134/61         Passed - Last Heart Rate in normal range    Pulse Readings from Last 1 Encounters:  05/21/24 61         Passed - Valid encounter within last 6 months    Recent Outpatient Visits           2 weeks ago Urinary incontinence without sensory awareness   [place] [facility] Celestia Rosaline SQUIBB, NP   2 months ago Chronic obstructive pulmonary disease, unspecified (HCC)   [place] [facility] Celestia Rosaline SQUIBB, NP   6 months ago Chondritis of auricle, bilateral   [place] [facility] Celestia Rosaline SQUIBB, NP   6 months ago Otalgia of both ears   [place] [facility] Celestia Rosaline SQUIBB, NP   7 months ago Hospital discharge follow-up   [place] [facility] Celestia Rosaline SQUIBB, NP       Future Appointments             In 2 months Kate Lonni CROME, MD [facility] at [facility] A Dept of [facility], H&[facility]            Refused Prescriptions Disp Refills   oxybutynin  (DITROPAN ) 5 MG tablet [Pharmacy Med Name: Oxybutynin  Chloride 5 MG Oral Tablet] 300 tablet 2    Sig: TAKE 1 TABLET BY MOUTH 2 TO 3  TIMES DAILY FOR BLADDER SPASMS     Urology:  Bladder Agents Passed - 05/21/2024  2:23 PM      Passed - Valid encounter within last  12 months    Recent Outpatient Visits           2 weeks ago Urinary incontinence without sensory awareness   [place] [facility] Celestia Rosaline SQUIBB, NP   2 months ago Chronic obstructive pulmonary disease, unspecified (HCC)   [place] [facility] Celestia Rosaline SQUIBB, NP   6 months ago Chondritis of auricle, bilateral   [place] [facility] Celestia Rosaline SQUIBB, NP   6 months ago Otalgia of both ears   [place] [facility] Celestia Rosaline SQUIBB, NP   7 months ago Hospital discharge follow-up   [place] [facility] Celestia Rosaline SQUIBB, NP       Future Appointments             In 2 months Kate Lonni CROME, MD [facility] at [facility] A Dept of [facility], H&V

## 2024-05-21 NOTE — Progress Notes (Addendum)
 PROGRESS NOTE        PATIENT DETAILS Name: Rhonda Hickman Age: 68 y.o. Sex: female Date of Birth: [birth year] Admit Date: 05/14/2024 Admitting Physician Terry LOISE Hurst, DO ERE:[email or web address], Aureliano SAUNDERS, MD  Brief Summary: Patient is a 68 y.o.  female with history of PAF on Eliquis-who recently underwent right knee replacement on 10/31-subsequently discharged to SNF on 11/3-brought to the ED on 11/4 for altered mental status, A-fib RVR-found to have SBO.  Significant events: 11/4>> admit to [facility]  Significant studies: 11/4>> CT head: No acute abnormality 11/5>> CT chest/abdomen/pelvis: Findings suspicious for distal SBO.  Moderate stool burden.  Significant microbiology data: 11/4>> blood culture: No growth  Procedures: None  Consults: General Surgery Cardiology  Subjective: No major issues overnight-some right knee swelling-afebrile  Objective: Vitals: Blood pressure (!) 153/79, pulse 61, temperature 98.3 F (36.8 C), temperature source Oral, resp. rate (!) 22, height 6' (1.829 m), weight 106 kg, SpO2 97%.   Exam: Gen Exam:Alert awake-not in any distress HEENT:atraumatic, normocephalic Chest: B/L clear to auscultation anteriorly CVS:S1S2 regular Abdomen:soft non tender, non distended Extremities:no edema-mild right knee swelling-without any erythema/tenderness. Neurology: Non focal Skin: no rash  Pertinent Labs/Radiology:    Latest Ref Rng & Units 05/21/2024    4:16 AM 05/20/2024    4:28 AM 05/19/2024    4:29 AM  CBC  WBC 4.0 - 10.5 K/uL 16.8  15.2  13.2   Hemoglobin 12.0 - 15.0 g/dL 9.4  9.2  7.5   Hematocrit 36.0 - 46.0 % 27.6  27.6  22.1   Platelets 150 - 400 K/uL 379  367  355     Lab Results  Component Value Date   NA 137 05/20/2024   K 3.8 05/20/2024   CL 103 05/20/2024   CO2 22 05/20/2024      Assessment/Plan: SBO Resolved with conservative management Tolerating diet Numerous BMs almost on a daily basis.  PAF with  RVR Back in sinus rhythm No longer on amiodarone infusion Stable on oral amiodarone/metoprolol .   Anticoagulation resumed on 11/10-Hb remains stable overnight.  Acute toxic metabolic encephalopathy Initially this was suspected to be from Depakote  but this is a chronic medication for her-in retrospect-I suspect this was from narcotics and Compazine . She is awake/alert-continue to minimize narcotics/sedating agents as much as possible.  Lactic acidosis Probably secondary to dehydration from vomiting No evidence of PNA on CT imaging-no longer on Zosyn.  Leukocytosis Probably from post op inflammation of right knee (recent replacement) Afebrile Discussed with orthopedics-no signs of infection Recheck UA/blood cultures/chest x-ray-monitor off antibiotics.  Chronic HFpEF Euvolemic Diuretics as needed  AKI on CKD stage II AKI hemodynamically mediated-improving with supportive care  Microcytic anemia Appears to have microcytic anemia at baseline (probable iron deficiency-on iron supplementation at home)-hemoglobin worse due to acute illness/perhaps perioperative blood loss from recent knee surgery-no overt GI bleeding noted--brown stools per patient.  Transfused a total of 2 units of PRBC-Hb stable today.  Tolerating reinitiation of anticoagulation-without any evidence of bleeding-Xarelto  started on 11/10. Continue to follow CBC.  DM-2 CBG stable SSI  Recent Labs    05/20/24 2343 05/21/24 0418 05/21/24 0804  GLUCAP 170* 179* 177*     HLD Resume statin  COPD Not in exacerbation Continue bronchodilators  Hypokalemia/hypomagnesemia Replete/recheck  Mood disorder Confirmed with patient x 2-she is on both Depakote  and Wellbutrin -we will cautiously resume both  these medications today and see how she does.  Recent right knee replacement on 10/31 (Dr. Vernetta) PT/OT Minimal narcotics if possible. Some mild perioperative swelling around the knee but do not see any obvious  areas of infection.  Appears.  Orthopedic input-no signs of infection Xarelto  briefly held due to severe anemia-s/p 2 units of PRBC-once Hb stabilized-no evidence of overt bleeding-Xarelto  restarted on 11/10.  Class 1 Obesity: Estimated body mass index is 31.69 kg/m as calculated from the following:   Height as of this encounter: 6' (1.829 m).   Weight as of this encounter: 106 kg.   Code status:   Code Status: Full Code   DVT Prophylaxis:IV heparin  rivaroxaban  (XARELTO ) tablet 20 mg    Family Communication: Sister at bedside on 11/6-none at bedside this morning.   Disposition Plan: Status is: Inpatient Remains inpatient appropriate because: Severity of illness   Planned Discharge Destination:Skilled nursing facility   Diet: Diet Order             Diet Carb Modified Fluid consistency: Thin; Room service appropriate? Yes  Diet effective now                     Antimicrobial agents: Anti-infectives (From admission, onward)    Start     Dose/Rate Route Frequency Ordered Stop   05/15/24 0600  piperacillin-tazobactam (ZOSYN) IVPB 3.375 g  Status:  Discontinued        3.375 g 12.5 mL/hr over 240 Minutes Intravenous Every 8 hours 05/15/24 0031 05/16/24 1025   05/14/24 2145  cefTRIAXone  (ROCEPHIN ) 2 g in sodium chloride  0.9 % 100 mL IVPB        2 g 200 mL/hr over 30 Minutes Intravenous  Once 05/14/24 2131 05/14/24 2357   05/14/24 2145  azithromycin  (ZITHROMAX ) 500 mg in sodium chloride  0.9 % 250 mL IVPB        500 mg 250 mL/hr over 60 Minutes Intravenous  Once 05/14/24 2131 05/15/24 0116        MEDICATIONS: Scheduled Meds:  acetaminophen   1,000 mg Oral Q8H   amiodarone  200 mg Oral BID   buPROPion   150 mg Oral BID   divalproex   500 mg Oral Daily   And   divalproex   1,000 mg Oral Q2200   ferrous sulfate   325 mg Oral BID WC   fluticasone  furoate-vilanterol  1 puff Inhalation Daily   insulin  aspart  0-9 Units Subcutaneous Q4H   metoprolol  tartrate  25 mg Oral  BID   rivaroxaban   20 mg Oral Q supper   rosuvastatin   10 mg Oral Daily   Continuous Infusions:    PRN Meds:.hydrALAZINE , ipratropium-albuterol , ondansetron  (ZOFRAN ) IV, oxyCODONE    I have personally reviewed following labs and imaging studies  LABORATORY DATA: CBC: Recent Labs  Lab 05/14/24 1940 05/14/24 1953 05/15/24 0429 05/15/24 1215 05/16/24 0334 05/17/24 0556 05/18/24 0416 05/18/24 1636 05/19/24 0429 05/20/24 0428 05/21/24 0416  WBC 13.7*  --  8.8   < > 9.5   < > 14.4* 14.2* 13.2* 15.2* 16.8*  NEUTROABS 9.1*  --  5.3  --  6.2  --   --   --   --   --   --   HGB 9.6*   < > 7.6*   < > 8.1*   < > 6.9* 7.8* 7.5* 9.2* 9.4*  HCT 29.8*   < > 23.9*   < > 25.0*   < > 20.3* 23.1* 22.1* 27.6* 27.6*  MCV 80.5  --  81.0   < > 79.4*   < > 78.1* 78.6* 78.6* 80.2 79.8*  PLT 334  --  283   < > 355   < > 346 345 355 367 379   < > = values in this interval not displayed.    Basic Metabolic Panel: Recent Labs  Lab 05/15/24 0429 05/15/24 1215 05/16/24 0334 05/17/24 0556 05/18/24 0416 05/19/24 0429 05/20/24 0428  NA 129*   < > 137 138 135 135 137  K 4.3   < > 3.6 3.2* 3.0* 3.4* 3.8  CL 92*   < > 103 106 103 102 103  CO2 20*   < > 19* 19* 22 22 22   GLUCOSE 470*   < > 219* 167* 153* 186* 176*  BUN 46*   < > 36* 18 9 6* 6*  CREATININE 1.30*   < > 1.26* 1.03* 0.87 0.86 0.84  CALCIUM  7.5*   < > 8.3* 7.7* 7.9* 8.0* 8.3*  MG 2.3  --   --   --  1.9 1.9  --   PHOS 3.7  --   --   --   --   --   --    < > = values in this interval not displayed.    GFR: Estimated Creatinine Clearance: 88.5 mL/min (by C-G formula based on SCr of 0.84 mg/dL).  Liver Function Tests: Recent Labs  Lab 05/14/24 1940 05/15/24 0429 05/17/24 0556  AST 34 19 14*  ALT 28 20 13   ALKPHOS 73 56 53  BILITOT 0.9 0.5 0.6  PROT 7.6 6.7 6.2*  ALBUMIN 2.2* 2.3* 2.0*   No results for input(s): LIPASE, AMYLASE in the last 168 hours. Recent Labs  Lab 05/14/24 1940  AMMONIA 28    Coagulation  Profile: No results for input(s): INR, PROTIME in the last 168 hours.  Cardiac Enzymes: No results for input(s): CKTOTAL, CKMB, CKMBINDEX, TROPONINI in the last 168 hours.  BNP (last 3 results) No results for input(s): PROBNP in the last 8760 hours.  Lipid Profile: No results for input(s): CHOL, HDL, LDLCALC, TRIG, CHOLHDL, LDLDIRECT in the last 72 hours.  Thyroid  Function Tests: No results for input(s): TSH, T4TOTAL, FREET4, T3FREE, THYROIDAB in the last 72 hours.  Anemia Panel: No results for input(s): VITAMINB12, FOLATE, FERRITIN, TIBC, IRON, RETICCTPCT in the last 72 hours.   Urine analysis:    Component Value Date/Time   COLORURINE YELLOW 05/15/2024 1336   APPEARANCEUR CLEAR 05/15/2024 1336   LABSPEC 1.025 05/15/2024 1336   PHURINE 6.0 05/15/2024 1336   GLUCOSEU NEGATIVE 05/15/2024 1336   HGBUR MODERATE (A) 05/15/2024 1336   BILIRUBINUR NEGATIVE 05/15/2024 1336   BILIRUBINUR negative 12/08/2020 1009   BILIRUBINUR negative 05/04/2020 1349   KETONESUR NEGATIVE 05/15/2024 1336   PROTEINUR NEGATIVE 05/15/2024 1336   UROBILINOGEN 0.2 12/08/2020 1009   UROBILINOGEN 0.2 09/12/2013 1642   NITRITE POSITIVE (A) 05/15/2024 1336   LEUKOCYTESUR SMALL (A) 05/15/2024 1336    Sepsis Labs: Lactic Acid, Venous    Component Value Date/Time   LATICACIDVEN 1.5 05/16/2024 0334    MICROBIOLOGY: Recent Results (from the past 240 hours)  Blood Culture (routine x 2)     Status: None   Collection Time: 05/14/24  7:40 PM   Specimen: BLOOD LEFT ARM  Result Value Ref Range Status   Specimen Description BLOOD LEFT ARM  Final   Special Requests   Final    BOTTLES DRAWN AEROBIC AND ANAEROBIC Blood Culture adequate volume   Culture  Final    NO GROWTH 5 DAYS Performed at [facility], [address] [place], [place] [address]    Report Status 05/19/2024 FINAL  Final  Blood Culture (routine x 2)     Status: None   Collection Time:  05/14/24  7:41 PM   Specimen: BLOOD RIGHT ARM  Result Value Ref Range Status   Specimen Description BLOOD RIGHT ARM  Final   Special Requests   Final    BOTTLES DRAWN AEROBIC AND ANAEROBIC Blood Culture adequate volume   Culture   Final    NO GROWTH 5 DAYS Performed at [facility], [address] [place], [place] [address]    Report Status 05/19/2024 FINAL  Final    RADIOLOGY STUDIES/RESULTS: No results found.    LOS: 7 days   Donalda Applebaum, MD  [facility]    To contact the attending provider between 7A-7P or the covering provider during after hours 7P-7A, please log into the web site [email or web address] and access using universal [employer] password for that web site. If you do not have the password, please call the hospital operator.  05/21/2024, 10:26 AM

## 2024-05-22 DIAGNOSIS — E1100 Type 2 diabetes mellitus with hyperosmolarity without nonketotic hyperglycemic-hyperosmolar coma (NKHHC): Secondary | ICD-10-CM

## 2024-05-22 DIAGNOSIS — I4891 Unspecified atrial fibrillation: Secondary | ICD-10-CM | POA: Diagnosis not present

## 2024-05-22 DIAGNOSIS — Z794 Long term (current) use of insulin: Secondary | ICD-10-CM

## 2024-05-22 DIAGNOSIS — K56609 Unspecified intestinal obstruction, unspecified as to partial versus complete obstruction: Secondary | ICD-10-CM | POA: Diagnosis not present

## 2024-05-22 DIAGNOSIS — I483 Typical atrial flutter: Secondary | ICD-10-CM | POA: Diagnosis not present

## 2024-05-22 LAB — CBC
HCT: 25.8 % — ABNORMAL LOW (ref 36.0–46.0)
Hemoglobin: 8.6 g/dL — ABNORMAL LOW (ref 12.0–15.0)
MCH: 26.9 pg (ref 26.0–34.0)
MCHC: 33.3 g/dL (ref 30.0–36.0)
MCV: 80.6 fL (ref 80.0–100.0)
Platelets: 348 K/uL (ref 150–400)
RBC: 3.2 MIL/uL — ABNORMAL LOW (ref 3.87–5.11)
RDW: 18.1 % — ABNORMAL HIGH (ref 11.5–15.5)
WBC: 13.3 K/uL — ABNORMAL HIGH (ref 4.0–10.5)
nRBC: 0.1 % (ref 0.0–0.2)

## 2024-05-22 LAB — GLUCOSE, CAPILLARY
Glucose-Capillary: 136 mg/dL — ABNORMAL HIGH (ref 70–99)
Glucose-Capillary: 168 mg/dL — ABNORMAL HIGH (ref 70–99)
Glucose-Capillary: 176 mg/dL — ABNORMAL HIGH (ref 70–99)
Glucose-Capillary: 248 mg/dL — ABNORMAL HIGH (ref 70–99)

## 2024-05-22 MED ORDER — NOVOLOG FLEXPEN 100 UNIT/ML ~~LOC~~ SOPN: PEN_INJECTOR | SUBCUTANEOUS | Status: DC

## 2024-05-22 MED ORDER — AMIODARONE HCL 200 MG PO TABS: ORAL_TABLET | ORAL | Status: DC

## 2024-05-22 MED ORDER — OXYCODONE HCL 5 MG PO TABS: 5.0000 mg | ORAL_TABLET | Freq: Four times a day (QID) | ORAL | 0 refills | Status: DC | PRN

## 2024-05-22 MED ORDER — ACETAMINOPHEN 500 MG PO TABS: 1000.0000 mg | ORAL_TABLET | Freq: Three times a day (TID) | ORAL | Status: AC

## 2024-05-22 MED ORDER — METOPROLOL TARTRATE 50 MG PO TABS: 25.0000 mg | ORAL_TABLET | Freq: Two times a day (BID) | ORAL | Status: DC

## 2024-05-22 NOTE — Progress Notes (Signed)
 Physical Therapy Treatment Patient Details Name: Rhonda Hickman MRN: [id number] DOB: [birth year] Today's Date: 05/22/2024   History of Present Illness 68 y.o. female presents to [facility] from [facility] due to hypersomnolence and tachycardia. Pt with suspicion for distal SBO. Of note, pt d/c from hospital 11/4 after R TKA. PMHx:  severe OA status post left total knee replacement (06/20/23), obesity, COPD, type 2 diabetes, hypertension, hyperlipidemia, paroxysmal A-fib on Xarelto , CKD 3B, TIA, CVA, neuropathy, vertigo    PT Comments  The pt remains limited by R knee pain, impacting her tolerance with ROM exercises or PT providing assistance to move the R leg. She reports receiving pain meds prior to session and reported I feel drunk from the pain meds. She had poor attention to task and poor safety awareness, often letting go of the rails mid transition and throwing herself back down on the pillow. She needed extra time and modA to complete supine <> sit transitions today. She adamantly deferred transfer attempts due to feeling nauseated and due to R knee pain this date. Focused session rather on seated lower extremity exercises to improve her leg strength and R knee ROM. She is unable to extend her R knee >50% against gravity and only achieves ~70' knee flexion PROM (by gravity sitting EOB). Will continue to follow acutely.    If plan is discharge home, recommend the following: Two people to help with walking and/or transfers;Two people to help with bathing/dressing/bathroom;Assistance with cooking/housework;Assist for transportation;Help with stairs or ramp for entrance   Can travel by private vehicle     No  Equipment Recommendations  Wheelchair (measurements PT);Wheelchair cushion (measurements PT);Hospital bed;Hoyer lift    Recommendations for Other Services       Precautions / Restrictions Precautions Precautions: Fall Recall of Precautions/Restrictions: Impaired Precaution/Restrictions  Comments: tachycardia, R TKA on 10/31 Required Braces or Orthoses:  (no KI needed per Dr. Vernetta 11/7) Restrictions Weight Bearing Restrictions Per Provider Order: Yes RLE Weight Bearing Per Provider Order: Weight bearing as tolerated     Mobility  Bed Mobility Overal bed mobility: Needs Assistance Bed Mobility: Supine to Sit, Sit to Supine     Supine to sit: Used rails, Mod assist Sit to supine: Mod assist, Used rails, HOB elevated   General bed mobility comments: Pt needing repeated multi-modal cues to maintain attention on task to transition supine <> sit, needing cues to grab rails or therapist and pull to sit up. ModA needed to ascend and keep trunk ascended along with to pivot hips to EOB using bed pad. ModA needed to lift R leg onto bed with return to supine and scoot superiorly in bed with bed in trendelenburg position    Transfers                   General transfer comment: Pt adamantly declining transfer attempts this date    Ambulation/Gait               General Gait Details: unable, pt adamantly declining transfer attempts this date   Stairs             Wheelchair Mobility     Tilt Bed    Modified Rankin (Stroke Patients Only)       Balance Overall balance assessment: Needs assistance Sitting-balance support: Single extremity supported, Bilateral upper extremity supported, Feet supported Sitting balance-Leahy Scale: Poor Sitting balance - Comments: Pt throwing herself back and to the R back to the [facility] several times, needing CGA-modA to maintain her  static sitting balance Postural control: Posterior lean     Standing balance comment: Pt adamantly declining transfer attempts this date                            Communication Communication Communication: Impaired Factors Affecting Communication: Reduced clarity of speech;Difficulty expressing self (intermittent; reportedly from meds)  Cognition Arousal: Alert Behavior  During Therapy: Lability   PT - Cognitive impairments: Awareness, Attention, Initiation, Sequencing, Problem solving, Safety/Judgement, Memory                       PT - Cognition Comments: Pt varied greatly in personality/mood, one moment being kind and worried about therapist's safety, stating I don't want to hurt you. Then the next moment angry and yelling at therapist. Poor safety awareness, often throwing herself back down on the bed on her pillow. Poor attention to cues and task at hand, needing multi-modal cues repeated often to sequence mobility. Pt forgetting conversations multiple times during session Following commands: Impaired Following commands impaired: Follows one step commands inconsistently, Follows one step commands with increased time    Cueing Cueing Techniques: Tactile cues, Gestural cues, Verbal cues, Visual cues  Exercises Total Joint Exercises Heel Slides: AROM, Strengthening, Both, Supine, 15 reps Long Arc Quad: AROM, Strengthening, Both, 10 reps, Seated Knee Flexion: PROM, Right, Other reps (comment), Seated (prolonged passive stretch with use of gravity sitting EOB with EOB elevated for foot to be off the ground, achieving ~70' knee flexion on R) General Exercises - Lower Extremity Hip Flexion/Marching: AROM, Strengthening, Both, 5 reps, Seated    General Comments        Pertinent Vitals/Pain Pain Assessment Pain Assessment: Faces Faces Pain Scale: Hurts even more Pain Location: R LE with movement Pain Descriptors / Indicators: Grimacing, Guarding, Moaning, Sore Pain Intervention(s): Limited activity within patient's tolerance, Monitored during session, Premedicated before session, Repositioned (per pt she was premedicated)    Home Living                          Prior Function            PT Goals (current goals can now be found in the care plan section) Acute Rehab PT Goals Patient Stated Goal: to improve PT Goal Formulation:  With patient Time For Goal Achievement: 05/29/24 Potential to Achieve Goals: Fair Progress towards PT goals: Progressing toward goals    Frequency    Min 2X/week      PT Plan      Co-evaluation              AM-PAC PT 6 Clicks Mobility   Outcome Measure  Help needed turning from your back to your side while in a flat bed without using bedrails?: A Little Help needed moving from lying on your back to sitting on the side of a flat bed without using bedrails?: A Lot Help needed moving to and from a bed to a chair (including a wheelchair)?: Total Help needed standing up from a chair using your arms (e.g., wheelchair or bedside chair)?: Total Help needed to walk in hospital room?: Total Help needed climbing 3-5 steps with a railing? : Total 6 Click Score: 9    End of Session   Activity Tolerance: Patient limited by pain;Other (comment) (limited by pt's behavior) Patient left: in bed;with call bell/phone within reach;with bed alarm set   PT Visit  Diagnosis: Unsteadiness on feet (R26.81);History of falling (Z91.81);Muscle weakness (generalized) (M62.81);Difficulty in walking, not elsewhere classified (R26.2);Pain Pain - Right/Left: Right Pain - part of body: Knee     Time: 1302-1329 PT Time Calculation (min) (ACUTE ONLY): 27 min  Charges:    $Therapeutic Exercise: 8-22 mins $Therapeutic Activity: 8-22 mins PT General Charges $$ ACUTE PT VISIT: 1 Visit                     Theo Ferretti, PT, DPT Acute Rehabilitation Services  Office: [phone]    Theo CHRISTELLA Ferretti 05/22/2024, 1:49 PM

## 2024-05-22 NOTE — Telephone Encounter (Signed)
 They are needing a letter from provider

## 2024-05-22 NOTE — Discharge Summary (Signed)
 PATIENT DETAILS Name: Rhonda Hickman Age: 68 y.o. Sex: female Date of Birth: 06/07/1956 MRN: [id number]. Admitting Physician: Terry LOISE Hurst, DO ERE:Drytjmusfjw, Aureliano SAUNDERS, MD  Admit Date: 05/14/2024 Discharge date: 05/22/2024  Recommendations for Outpatient Follow-up:  Follow up with PCP in 1-2 weeks Please obtain CMP/CBC in one week  Admitted From:  SNF  Disposition: Skilled nursing facility   Discharge Condition: good  CODE STATUS:   Code Status: Full Code   Diet recommendation:  Diet Order             Diet - low sodium heart healthy           Diet Carb Modified           Diet Carb Modified Fluid consistency: Thin; Room service appropriate? Yes  Diet effective now                    Brief Summary: Patient is a 68 y.o.  female with history of PAF on Eliquis-who recently underwent right knee replacement on 10/31-subsequently discharged to SNF on 11/3-brought to the ED on 11/4 for altered mental status, A-fib RVR-found to have SBO.   Significant events: 11/4>> admit to [facility]   Significant studies: 11/4>> CT head: No acute abnormality 11/5>> CT chest/abdomen/pelvis: Findings suspicious for distal SBO.  Moderate stool burden.   Significant microbiology data: 11/4>> blood culture: No growth 11/11>> UA: No UTI 11/11>> chest x-ray: No pneumonia 11/11>> blood culture: No growth   Procedures: None   Consults: General Surgery Cardiology  Brief Hospital Course: SBO Resolved with conservative management Tolerating diet Numerous BMs almost on a daily basis.   PAF with RVR Back in sinus rhythm No longer on amiodarone infusion Stable on oral amiodarone/metoprolol .   Anticoagulation resumed on 11/10 (briefly held due to anemia requiring blood transfusion)-Hb remains stable without any major bleeding Cardiology recommending amiodarone 200 mg twice daily x 7 days, followed by 200 mg p.o. daily-until seen by A-fib clinic following which this will likely be  discontinued.   Acute toxic metabolic encephalopathy Initially this was suspected to be from Depakote  but this is a chronic medication for her-in retrospect-I suspect this was from narcotics and Compazine . She is awake/alert-continue to minimize narcotics/sedating agents as much as possible.   Lactic acidosis Probably secondary to dehydration from vomiting No evidence of PNA on CT imaging-no longer on Zosyn.   Leukocytosis Probably from post op inflammation of right knee (recent replacement) Afebrile-nontoxic-appearing Discussed with orthopedics-no signs of infection Since leukocytosis briefly worsen-repeat septic workup including UA/blood cultures/checks x-ray was done-this was negative-thankfully with just supportive care-leukocytosis is downtrending. Patient was monitored off antibiotics-suggest repeating CBC frequently while at SNF.   Chronic HFpEF Euvolemic Diuretics as needed   AKI on CKD stage II AKI hemodynamically mediated-improving with supportive care   Microcytic anemia Appears to have microcytic anemia at baseline (probable iron deficiency-on iron supplementation at home)-hemoglobin worse due to acute illness/perhaps perioperative blood loss from recent knee surgery-no overt GI bleeding noted--brown stools per patient.  Transfused a total of 2 units of PRBC-Hb stable today.  Tolerating reinitiation of anticoagulation-without any evidence of bleeding-Xarelto  started on 11/10. If anemia worsens in the future-Will need further workup including GI workup For now-continue to follow CBC periodically    DM-2 CBG stable SSI  HLD Resume statin   COPD Not in exacerbation Continue bronchodilators   Hypokalemia/hypomagnesemia Replete/recheck   Mood disorder Confirmed with patient x 2-she is on both Depakote  and Wellbutrin -we will cautiously resume both  these medications today and see how she does.   Recent right knee replacement on 10/31 (Dr. Vernetta) PT/OT eval.   Weightbearing as tolerated in her right knee. Minimal narcotics if possible. Some mild perioperative swelling around the knee but do not see any obvious areas of infection.  Appears. Per Orthopedic input-no signs of infection Xarelto  briefly held due to severe anemia-s/p 2 units of PRBC-once Hb stabilized-no evidence of overt bleeding-Xarelto  restarted on 11/10.   Class 1 Obesity: Estimated body mass index is 31.69 kg/m as calculated from the following:   Height as of this encounter: 6' (1.829 m).   Weight as of this encounter: 106 kg.    Discharge Diagnoses:  Active Problems:   AMS (altered mental status)   Atrial flutter (HCC)   Small bowel obstruction (HCC)   Long term (current) use of anticoagulants   Agatston coronary artery calcium  score less than 100   Anemia   Atrial fibrillation/flutter (HCC)   Long term current use of antiarrhythmic drug   Discharge Instructions:  Activity:  As tolerated with Full fall precautions use walker/cane & assistance as needed  Discharge Instructions     Diet - low sodium heart healthy   Complete by: As directed    Diet Carb Modified   Complete by: As directed    Discharge instructions   Complete by: As directed    Follow with Primary MD  Cicero Aureliano SAUNDERS, MD in 1-2 weeks  Please get a complete blood count and chemistry panel checked by your Primary MD at your next visit, and again as instructed by your Primary MD.  Get Medicines reviewed and adjusted: Please take all your medications with you for your next visit with your Primary MD  Laboratory/radiological data: Please request your Primary MD to go over all hospital tests and procedure/radiological results at the follow up, please ask your Primary MD to get all Hospital records sent to his/her office.  In some cases, they will be blood work, cultures and biopsy results pending at the time of your discharge. Please request that your primary care M.D. follows up on these  results.  Also Note the following: If you experience worsening of your admission symptoms, develop shortness of breath, life threatening emergency, suicidal or homicidal thoughts you must seek medical attention immediately by calling 911 or calling your MD immediately  if symptoms less severe.  You must read complete instructions/literature along with all the possible adverse reactions/side effects for all the Medicines you take and that have been prescribed to you. Take any new Medicines after you have completely understood and accpet all the possible adverse reactions/side effects.   Do not drive when taking Pain medications or sleeping medications (Benzodaizepines)  Do not take more than prescribed Pain, Sleep and Anxiety Medications. It is not advisable to combine anxiety,sleep and pain medications without talking with your primary care practitioner  Special Instructions: If you have smoked or chewed Tobacco  in the last 2 yrs please stop smoking, stop any regular Alcohol  and or any Recreational drug use.  Wear Seat belts while driving.  Please note: You were cared for by a hospitalist during your hospital stay. Once you are discharged, your primary care physician will handle any further medical issues. Please note that NO REFILLS for any discharge medications will be authorized once you are discharged, as it is imperative that you return to your primary care physician (or establish a relationship with a primary care physician if you do not have  one) for your post hospital discharge needs so that they can reassess your need for medications and monitor your lab values.   Check CBGs before meals and at bedtime   Increase activity slowly   Complete by: As directed    No wound care   Complete by: As directed       Allergies as of 05/22/2024       Reactions   Aspirin  Nausea Only, Other (See Comments)   Causes stomach pain        Medication List     STOP taking these medications     amLODipine  5 MG tablet Commonly known as: NORVASC    Lantus  SoloStar 100 UNIT/ML Solostar Pen Generic drug: insulin  glargine   lidocaine  5 % Commonly known as: Lidoderm        TAKE these medications    acetaminophen  500 MG tablet Commonly known as: TYLENOL  Take 2 tablets (1,000 mg total) by mouth every 8 (eight) hours for 7 days.   amiodarone 200 MG tablet Commonly known as: PACERONE Amiodarone 200 mg p.o. twice daily x 7 days, followed by amiodarone 200 mg p.o. daily until seen by cardiology at A-fib clinic.   budesonide -formoterol  160-4.5 MCG/ACT inhaler Commonly known as: Symbicort  Inhale 2 puffs into the lungs 2 (two) times daily.   buPROPion  150 MG 12 hr tablet Commonly known as: WELLBUTRIN  SR TAKE ONE TABLET TWICE DAILY   divalproex  500 MG 24 hr tablet Commonly known as: DEPAKOTE  ER Take 500-1,000 mg by mouth See admin instructions. Take 500 mg by mouth in the morning and 1000 mg at bedtime   FeroSul 325 (65 Fe) MG tablet Generic drug: ferrous sulfate  TAKE ONE TABLET BY MOUTH DAILY AT 9AM WITH BREAKFAST   metFORMIN  1000 MG tablet Commonly known as: GLUCOPHAGE  Take 1 tablet (1,000 mg total) by mouth 2 (two) times daily. TAKE ONE TABLET BY MOUTH TWICE DAILY @ 9AM & 5PM WITH MEALS   metoprolol  tartrate 50 MG tablet Commonly known as: LOPRESSOR  Take 0.5 tablets (25 mg total) by mouth 2 (two) times daily. What changed: See the new instructions.   NovoLOG  FlexPen 100 UNIT/ML FlexPen Generic drug: insulin  aspart 0-9 Units, Subcutaneous, 3 times daily with meals CBG < 70: Implement Hypoglycemia measures CBG 70 - 120: 0 units CBG 121 - 150: 1 unit CBG 151 - 200: 2 units CBG 201 - 250: 3 units CBG 251 - 300: 5 units CBG 301 - 350: 7 units CBG 351 - 400: 9 units CBG > 400: call MD   oxybutynin  5 MG tablet Commonly known as: DITROPAN  Take 1 tablet (5 mg total) by mouth 2-3 (two to three) times daily for bladder spasms.   oxyCODONE  5 MG immediate release  tablet Commonly known as: Oxy IR/ROXICODONE  Take 1-2 tablets (5-10 mg total) by mouth every 6 (six) hours as needed for moderate pain (pain score 4-6) (pain score 4-6). What changed: when to take this   Ozempic  (0.25 or 0.5 MG/DOSE) 2 MG/3ML Sopn Generic drug: Semaglutide (0.25 or 0.5MG /DOS) INJECT 0.5MG  ONCE WEEKLY   rosuvastatin  10 MG tablet Commonly known as: CRESTOR  TAKE ONE TABLET ONCE DAILY   tiZANidine 4 MG tablet Commonly known as: ZANAFLEX Take 1 tablet (4 mg total) by mouth every 6 (six) hours as needed for muscle spasms.   Xarelto  20 MG Tabs tablet Generic drug: rivaroxaban  TAKE ONE TABLET DAILY AT 5PM WITH SUPPER        Contact information for follow-up providers     Cicero Aureliano SAUNDERS, MD.  Schedule an appointment as soon as possible for a visit in 1 week(s).   Specialty: Internal Medicine Contact information: [address] [place] [address] [phone]         Vernetta Lonni GRADE, MD. Schedule an appointment as soon as possible for a visit in 1 week(s).   Specialty: Orthopedic Surgery Contact information: [address] [place]  [place] [address] [phone]         Nellene Bienenstock R, [place] Follow up on 06/04/2024.   Specialty: Cardiology Why: appointment at 3 pm Contact information: [address] [place] [address] [phone]              Contact information for after-discharge care     Destination     [facility] .   Service: Skilled Nursing Contact information: [address] [place]  [address] [phone]                    Allergies  Allergen Reactions   Aspirin  Nausea Only and Other (See Comments)    Causes stomach pain     Other Procedures/Studies: DG CHEST PORT 1 VIEW Result Date: 05/21/2024 EXAM: 1 VIEW(S) XRAY OF THE CHEST 05/21/2024 08:50:00 AM COMPARISON: 05/14/2024 CLINICAL HISTORY: Leukocytosis. FINDINGS: LUNGS AND PLEURA: Low lung volumes with  increased streaky right basilar opacities, which could reflect atelectasis or infiltrate. No pulmonary edema. No pleural effusion. No pneumothorax. HEART AND MEDIASTINUM: No acute abnormality of the cardiac and mediastinal silhouettes. BONES AND SOFT TISSUES: No acute osseous abnormality. IMPRESSION: 1. Low lung volumes with increased streaky right basilar opacities, which could reflect atelectasis or infiltrate. Electronically signed by: Harrietta Sherry MD 05/21/2024 01:38 PM EST RP Workstation: [id number]   DG Abd Portable 1V Result Date: 05/16/2024 EXAM: 1 VIEW XRAY OF THE ABDOMEN 05/16/2024 08:21:00 AM COMPARISON: 05/15/2024 CLINICAL HISTORY: SBO (small bowel obstruction) (HCC) FINDINGS: BOWEL: Dilute contrast material opacifies the small bowel loops. Diminished small bowel gas limits assessment for bowel obstruction. However, there are continued mildly dilated loops of small bowel in the right lower quadrant of the abdomen measuring up to 4.1 cm. Gas and stool noted within the colon up to the rectum. SOFT TISSUES: Cholecystectomy clips noted. No opaque urinary calculi. BONES: No acute osseous abnormality. IMPRESSION: 1. Continued mildly dilated small-bowel loops in the right lower quadrant measuring up to 4.1 cm, which may reflect ongoing ileus or partial small-bowel obstruction. Electronically signed by: Waddell Calk MD 05/16/2024 08:32 AM EST RP Workstation: [id number]   DG Abd Portable 1V-Small Bowel Obstruction Protocol-initial, 8 hr delay Result Date: 05/15/2024 CLINICAL DATA:  Small-bowel obstruction EXAM: PORTABLE ABDOMEN - 1 VIEW COMPARISON:  05/15/2024 FINDINGS: Three supine frontal views of the abdomen and pelvis are obtained. Enteric catheter has been removed. The previously administered oral contrast remains within the stomach. Slow diffusion of oral contrast into the proximal small bowel. No contrast has reached the colon by the time of imaging. Continued distended gas-filled loops of small  bowel are seen within the right hemiabdomen. IMPRESSION: 1. Continued findings of small-bowel obstruction. The previously administered oral contrast has not yet reached the colon by the time of imaging. 2. Interval removal of the enteric catheter. Electronically Signed   By: Ozell Daring M.D.   On: 05/15/2024 14:56   DG Abd Portable 1 View Result Date: 05/15/2024 CLINICAL DATA:  NG tube placement EXAM: PORTABLE ABDOMEN - 1 VIEW COMPARISON:  05/15/2024 FINDINGS: Similarly positioned esophagogastric tube, with the tip obscured by enteric contrast in the  stomach. The last side hole remains at the level of the GE junction. Similar gaseous distention of the small bowel in the upper abdomen.No pneumoperitoneum. Cholecystectomy clips. No acute fracture or destructive lesion. IMPRESSION: 1. Similarly positioned esophagogastric tube, with the tip obscured by enteric contrast in the stomach. 2. Redemonstrated gas-filled and dilated small bowel in the upper abdomen. Electronically Signed   By: Rogelia Myers M.D.   On: 05/15/2024 09:02   DG Abd Portable 1V-Small Bowel Protocol-Position Verification Result Date: 05/15/2024 EXAM: 1 VIEW XRAY OF THE ABDOMEN 05/15/2024 03:54:00 AM COMPARISON: CT abdomen and pelvis 08/06/2023. CLINICAL HISTORY: Encounter for imaging study to confirm nasogastric (NG) tube placement. FINDINGS: LINES, TUBES AND DEVICES: Enteric tube in place with tip overlying the expected region of the gastric lumen and side port overlying the expected region of the gastroesophageal junction. BOWEL: Several loops of mildly dilated small bowel within the abdomen. SOFT TISSUES: Right upper quadrant surgical clips noted. No opaque urinary calculi. BONES: No acute osseous abnormality. IMPRESSION: 1. Enteric tube side port at the gastroesophageal junction, recommend advancement of 5 cm to ensure side hole placement within the stomach 2. Stable bowel gas pattern from earlier CT. Electronically signed by: Helayne Hurst MD 05/15/2024 04:26 AM EST RP Workstation: [id number]   CT CHEST ABDOMEN PELVIS WO CONTRAST Result Date: 05/15/2024 EXAM: CT CHEST, ABDOMEN AND PELVIS WITHOUT CONTRAST 05/15/2024 01:35:12 AM TECHNIQUE: CT of the chest, abdomen and pelvis was performed without the administration of intravenous contrast. Multiplanar reformatted images are provided for review. Automated exposure control, iterative reconstruction, and/or weight based adjustment of the mA/kV was utilized to reduce the radiation dose to as low as reasonably achievable. COMPARISON: CT heart 11/03/2022, CT abdomen and pelvis 05/22/2021. CLINICAL HISTORY: Sepsis. FINDINGS: CHEST: MEDIASTINUM AND LYMPH NODES: Scattered calcifications in the left anterior descending coronary artery. Pericardium is unremarkable. The central airways are clear. No mediastinal, hilar or axillary lymphadenopathy. LUNGS AND PLEURA: Lower lobe atelectasis bilaterally. No focal consolidation or pulmonary edema. No pleural effusion or pneumothorax. ABDOMEN AND PELVIS: LIVER: The liver is unremarkable. GALLBLADDER AND BILE DUCTS: Prior cholecystectomy. No biliary ductal dilatation. SPLEEN: No acute abnormality. PANCREAS: No acute abnormality. ADRENAL GLANDS: No acute abnormality. KIDNEYS, URETERS AND BLADDER: No stones in the kidneys or ureters. No hydronephrosis. No perinephric or periureteral stranding. Air within the urinary bladder, presumably from recent catheterization. GI AND BOWEL: Stomach demonstrates no acute abnormality. Mildly dilated fluid-filled small bowel loops into the pelvis. Distal small bowel is decompressed. Cannot exclude distal small bowel obstruction. Moderate stool burden throughout the colon. Normal appendix. REPRODUCTIVE ORGANS: No acute abnormality. PERITONEUM AND RETROPERITONEUM: Small amount of free fluid in the cul-de-sac. No free air. VASCULATURE: Aortic atherosclerosis. Aorta is normal in caliber. ABDOMINAL AND PELVIS LYMPH NODES: No  lymphadenopathy. BONES AND SOFT TISSUES: No acute osseous abnormality. No focal soft tissue abnormality. IMPRESSION: 1. Findings suspicious for distal small bowel obstruction, given mildly dilated fluid-filled small bowel loops with decompressed distal small bowel. 2. Moderate colonic stool burden. 3. Coronary artery disease, aortic atherosclerosis Electronically signed by: Franky Crease MD 05/15/2024 01:39 AM EST RP Workstation: [id number]   CT Head Wo Contrast Result Date: 05/14/2024 EXAM: CT HEAD WITHOUT CONTRAST 05/14/2024 08:02:51 PM TECHNIQUE: CT of the head was performed without the administration of intravenous contrast. Automated exposure control, iterative reconstruction, and/or weight based adjustment of the mA/kV was utilized to reduce the radiation dose to as low as reasonably achievable. COMPARISON: 05/13/2024. CLINICAL HISTORY: Delirium. FINDINGS: BRAIN AND VENTRICLES: No acute hemorrhage.  No evidence of acute infarct. No hydrocephalus. No extra-axial collection. No mass effect or midline shift. There is atrophy and chronic small vessel disease throughout the deep white matter. ORBITS: No acute abnormality. SINUSES: No acute abnormality. SOFT TISSUES AND SKULL: No acute soft tissue abnormality. No skull fracture. IMPRESSION: 1. No acute intracranial abnormality. 2. Atrophy and chronic small vessel disease throughout the deep white matter. Electronically signed by: Franky Crease MD 05/14/2024 08:08 PM EST RP Workstation: [id number]   DG Chest Port 1 View Result Date: 05/14/2024 EXAM: 1 VIEW(S) XRAY OF THE CHEST 05/14/2024 07:55:00 PM COMPARISON: 10/02/2023 CLINICAL HISTORY: Questionable sepsis - evaluate for abnormality FINDINGS: LUNGS AND PLEURA: Low lung volumes. Right basilar atelectasis. No pulmonary edema. No pleural effusion. No pneumothorax. HEART AND MEDIASTINUM: No acute abnormality of the cardiac and mediastinal silhouettes. BONES AND SOFT TISSUES: No acute osseous abnormality. IMPRESSION:  1. Low lung volumes with right basilar atelectasis. Electronically signed by: Franky Crease MD 05/14/2024 08:05 PM EST RP Workstation: [id number]   CT HEAD WO CONTRAST ( ) Result Date: 05/13/2024 EXAM: CT HEAD WITHOUT CONTRAST 05/13/2024 10:55:43 AM TECHNIQUE: CT of the head was performed without the administration of intravenous contrast. Automated exposure control, iterative reconstruction, and/or weight based adjustment of the mA/kV was utilized to reduce the radiation dose to as low as reasonably achievable. COMPARISON: Head CT 05/22/2021 and MRI 06/16/2020. CLINICAL HISTORY: Mental status change, unknown cause. FINDINGS: BRAIN AND VENTRICLES: There is no evidence of an acute infarct, intracranial hemorrhage, mass, midline shift, hydrocephalus, or extra-axial fluid collection. Cerebral volume is within normal limits for age. Patchy hypodensities in the cerebral white matter bilaterally are similar to the prior CT and nonspecific but compatible with moderate chronic small vessel ischemic disease. Calcified atherosclerosis at the skull base. ORBITS: Bilateral cataract extraction. SINUSES: No acute abnormality. SOFT TISSUES AND SKULL: No acute soft tissue abnormality. No skull fracture. IMPRESSION: 1. No acute intracranial abnormality. 2. Moderate chronic small vessel ischemic disease. Electronically signed by: Dasie Hamburg MD 05/13/2024 11:24 AM EST RP Workstation: [id number]   DG Knee Right Port Result Date: 05/10/2024 EXAM: 1 or 2 VIEW(S) XRAY OF THE RIGHT KNEE 05/10/2024 10:53:00 AM COMPARISON: Comparison with previous study dated 04/18/2024. CLINICAL HISTORY: [id number] Status post total right knee replacement [id number] Status post total right knee replacement. FINDINGS: BONES AND JOINTS: Super postoperative changes with placement of a right total knee arthroplasty including patellofemoral component. Components appear well seated. No acute bony abnormalities. No joint dislocation. SOFT TISSUES: Soft tissue  gas and skin clips are consistent with recent surgery. IMPRESSION: 1. Status post right total knee arthroplasty with well-seated components. 2. No acute bony abnormality. Electronically signed by: Elsie Gravely MD 05/10/2024 05:50 PM EDT RP Workstation: [id number]     TODAY-DAY OF DISCHARGE:  Subjective:   Rhonda Hickman today has no headache,no chest abdominal pain,no new weakness tingling or numbness, feels much better wants to go home today.   Objective:   Blood pressure 132/63, pulse (!) 57, temperature 97.6 F (36.4 C), temperature source Oral, resp. rate (!) 21, height 6' (1.829 m), weight 106 kg, SpO2 97%.  Intake/Output Summary (Last 24 hours) at 05/22/2024 0917 Last data filed at 05/22/2024 0413 Gross per 24 hour  Intake --  Output 1850 ml  Net -1850 ml   Filed Weights   05/14/24 1921  Weight: 106 kg    Exam: Awake Alert, Oriented *3, No new F.N deficits, Normal affect [place].AT,PERRAL Supple Neck,No JVD, No cervical lymphadenopathy appriciated.  Symmetrical Chest wall movement, Good  air movement bilaterally, CTAB RRR,No Gallops,Rubs or new Murmurs, No Parasternal Heave +ve B.Sounds, Abd Soft, Non tender, No organomegaly appriciated, No rebound -guarding or rigidity. No Cyanosis, Clubbing or edema, No new Rash or bruise   PERTINENT RADIOLOGIC STUDIES: DG CHEST PORT 1 VIEW Result Date: 05/21/2024 EXAM: 1 VIEW(S) XRAY OF THE CHEST 05/21/2024 08:50:00 AM COMPARISON: 05/14/2024 CLINICAL HISTORY: Leukocytosis. FINDINGS: LUNGS AND PLEURA: Low lung volumes with increased streaky right basilar opacities, which could reflect atelectasis or infiltrate. No pulmonary edema. No pleural effusion. No pneumothorax. HEART AND MEDIASTINUM: No acute abnormality of the cardiac and mediastinal silhouettes. BONES AND SOFT TISSUES: No acute osseous abnormality. IMPRESSION: 1. Low lung volumes with increased streaky right basilar opacities, which could reflect atelectasis or infiltrate.  Electronically signed by: Shahmeer Marcelino MD 05/21/2024 01:38 PM EST RP Workstation: [id number]     PERTINENT LAB RESULTS: CBC: Recent Labs    05/21/24 0416 05/22/24 0322  WBC 16.8* 13.3*  HGB 9.4* 8.6*  HCT 27.6* 25.8*  PLT 379 348   CMET CMP     Component Value Date/Time   NA 137 05/20/2024 0428   NA 139 04/19/2023 0958   K 3.8 05/20/2024 0428   CL 103 05/20/2024 0428   CO2 22 05/20/2024 0428   GLUCOSE 176 (H) 05/20/2024 0428   BUN 6 (L) 05/20/2024 0428   BUN 10 04/19/2023 0958   CREATININE 0.84 05/20/2024 0428   CALCIUM  8.3 (L) 05/20/2024 0428   PROT 6.2 (L) 05/17/2024 0556   PROT 7.5 04/19/2023 0958   ALBUMIN 2.0 (L) 05/17/2024 0556   ALBUMIN 4.2 04/19/2023 0958   AST 14 (L) 05/17/2024 0556   ALT 13 05/17/2024 0556   ALKPHOS 53 05/17/2024 0556   BILITOT 0.6 05/17/2024 0556   BILITOT <0.2 04/19/2023 0958   EGFR 67 04/19/2023 0958   GFRNONAA >60 05/20/2024 0428    GFR Estimated Creatinine Clearance: 88.5 mL/min (by C-G formula based on SCr of 0.84 mg/dL). No results for input(s): LIPASE, AMYLASE in the last 72 hours. No results for input(s): CKTOTAL, CKMB, CKMBINDEX, TROPONINI in the last 72 hours. Invalid input(s): POCBNP No results for input(s): DDIMER in the last 72 hours. No results for input(s): HGBA1C in the last 72 hours. No results for input(s): CHOL, HDL, LDLCALC, TRIG, CHOLHDL, LDLDIRECT in the last 72 hours. No results for input(s): TSH, T4TOTAL, T3FREE, THYROIDAB in the last 72 hours.  Invalid input(s): FREET3 No results for input(s): VITAMINB12, FOLATE, FERRITIN, TIBC, IRON, RETICCTPCT in the last 72 hours. Coags: No results for input(s): INR in the last 72 hours.  Invalid input(s): PT Microbiology: Recent Results (from the past 240 hours)  Blood Culture (routine x 2)     Status: None   Collection Time: 05/14/24  7:40 PM   Specimen: BLOOD LEFT ARM  Result Value Ref Range Status    Specimen Description BLOOD LEFT ARM  Final   Special Requests   Final    BOTTLES DRAWN AEROBIC AND ANAEROBIC Blood Culture adequate volume   Culture   Final    NO GROWTH 5 DAYS Performed at [facility], [address] [place], [place] [address]    Report Status 05/19/2024 FINAL  Final  Blood Culture (routine x 2)     Status: None   Collection Time: 05/14/24  7:41 PM   Specimen: BLOOD RIGHT ARM  Result Value Ref Range Status   Specimen Description BLOOD RIGHT ARM  Final   Special Requests   Final  BOTTLES DRAWN AEROBIC AND ANAEROBIC Blood Culture adequate volume   Culture   Final    NO GROWTH 5 DAYS Performed at [facility], [address], [place], [place] [address]    Report Status 05/19/2024 FINAL  Final  Culture, blood (Routine X 2) w Reflex to ID Panel     Status: None (Preliminary result)   Collection Time: 05/21/24  8:44 AM   Specimen: BLOOD RIGHT HAND  Result Value Ref Range Status   Specimen Description BLOOD RIGHT HAND  Final   Special Requests   Final    BOTTLES DRAWN AEROBIC AND ANAEROBIC Blood Culture adequate volume   Culture   Final    NO GROWTH < 24 HOURS Performed at [facility], [address] [place], [place] [address]    Report Status PENDING  Incomplete  Culture, blood (Routine X 2) w Reflex to ID Panel     Status: None (Preliminary result)   Collection Time: 05/21/24  8:44 AM   Specimen: BLOOD LEFT HAND  Result Value Ref Range Status   Specimen Description BLOOD LEFT HAND  Final   Special Requests   Final    BOTTLES DRAWN AEROBIC AND ANAEROBIC Blood Culture adequate volume   Culture   Final    NO GROWTH < 24 HOURS Performed at [facility], [address] [place], [place] [address]    Report Status PENDING  Incomplete    FURTHER DISCHARGE INSTRUCTIONS:  Get Medicines reviewed and adjusted: Please take all your medications with you for your next visit with your Primary MD  Laboratory/radiological data: Please  request your Primary MD to go over all hospital tests and procedure/radiological results at the follow up, please ask your Primary MD to get all Hospital records sent to his/her office.  In some cases, they will be blood work, cultures and biopsy results pending at the time of your discharge. Please request that your primary care M.D. goes through all the records of your hospital data and follows up on these results.  Also Note the following: If you experience worsening of your admission symptoms, develop shortness of breath, life threatening emergency, suicidal or homicidal thoughts you must seek medical attention immediately by calling 911 or calling your MD immediately  if symptoms less severe.  You must read complete instructions/literature along with all the possible adverse reactions/side effects for all the Medicines you take and that have been prescribed to you. Take any new Medicines after you have completely understood and accpet all the possible adverse reactions/side effects.   Do not drive when taking Pain medications or sleeping medications (Benzodaizepines)  Do not take more than prescribed Pain, Sleep and Anxiety Medications. It is not advisable to combine anxiety,sleep and pain medications without talking with your primary care practitioner  Special Instructions: If you have smoked or chewed Tobacco  in the last 2 yrs please stop smoking, stop any regular Alcohol  and or any Recreational drug use.  Wear Seat belts while driving.  Please note: You were cared for by a hospitalist during your hospital stay. Once you are discharged, your primary care physician will handle any further medical issues. Please note that NO REFILLS for any discharge medications will be authorized once you are discharged, as it is imperative that you return to your primary care physician (or establish a relationship with a primary care physician if you do not have one) for your post hospital discharge needs  so that they can  reassess your need for medications and monitor your lab values.  Total Time spent coordinating discharge including counseling, education and face to face time equals greater than 30 minutes.  SignedBETHA Donalda Applebaum 05/22/2024 9:17 AM

## 2024-05-22 NOTE — TOC Progression Note (Signed)
 Transition of Care [facility]) - Progression Note    Patient Details  Name: Rhonda Hickman MRN: [id number] Date of Birth: 1955/07/21  Transition of Care [facility]) CM/SW Contact  [name], LCSW Phone Number: 05/22/2024, 9:51 AM  Clinical Narrative:    [facility] able to accept patient this afternoon. CSW spoke with patient's sister, [name], and provided update. She reported agreement with [facility] for transport and asked if RN would call her once [facility] arrives so she can meet patient at [name].   Expected Discharge Plan: Skilled Nursing Facility Barriers to Discharge: Barriers Resolved               Expected Discharge Plan and Services In-house Referral: Clinical Social Work   Post Acute Care Choice: Skilled Nursing Facility Living arrangements for the past 2 months: Apartment Expected Discharge Date: 05/22/24                                     Social Drivers of Health (SDOH) Interventions SDOH Screenings   Food Insecurity: No Food Insecurity (05/10/2024)  Housing: Low Risk  (05/10/2024)  Transportation Needs: No Transportation Needs (05/10/2024)  Utilities: Not At Risk (05/10/2024)  Depression (PHQ2-9): Low Risk  (03/29/2024)  Social Connections: Patient Unable To Answer (05/10/2024)  Tobacco Use: High Risk (05/10/2024)    Readmission Risk Interventions     No data to display

## 2024-05-22 NOTE — TOC Transition Note (Signed)
 Transition of Care [facility]) - Discharge Note   Patient Details  Name: Rhonda Hickman MRN: [id number] Date of Birth: February 05, 1956  Transition of Care [facility]) CM/SW Contact:  [name], LCSW Phone Number: 05/22/2024, 2:32 PM   Clinical Narrative:    Patient will DC to: [facility] Anticipated DC date: 05/22/24 Family notified: Sister [name] Transport by: [facility]   Per MD patient ready for DC to [facility]. RN to call report prior to discharge [phone] room 103p). RN, patient, patient's family, and facility notified of DC. Discharge Summary and FL2 sent to facility. DC packet on chart including signed script. Ambulance transport requested for patient.   CSW will sign off for now as social work intervention is no longer needed. Please consult us  again if new needs arise.     Final next level of care: Skilled Nursing Facility Barriers to Discharge: Barriers Resolved   Patient Goals and CMS Choice Patient states their goals for this hospitalization and ongoing recovery are:: Return to rehab CMS Medicare.gov Compare Post Acute Care list provided to:: Patient Represenative (must comment) Choice offered to / list presented to : Sibling [place] ownership interest in [facility].provided to:: Sibling    Discharge Placement   Existing PASRR number confirmed : 05/22/24          Patient chooses bed at: [facility] Patient to be transferred to facility by: [facility] Name of family member notified: Sister Patient and family notified of of transfer: 05/22/24  Discharge Plan and Services Additional resources added to the After Visit Summary for   In-house Referral: Clinical Social Work   Post Acute Care Choice: Skilled Nursing Facility                               Social Drivers of Health (SDOH) Interventions SDOH Screenings   Food Insecurity: No Food Insecurity (05/10/2024)  Housing: Low Risk  (05/10/2024)  Transportation Needs: No Transportation  Needs (05/10/2024)  Utilities: Not At Risk (05/10/2024)  Depression (PHQ2-9): Low Risk  (03/29/2024)  Social Connections: Patient Unable To Answer (05/10/2024)  Tobacco Use: High Risk (05/10/2024)     Readmission Risk Interventions     No data to display

## 2024-05-23 ENCOUNTER — Encounter: Admitting: Orthopaedic Surgery

## 2024-05-24 ENCOUNTER — Telehealth (INDEPENDENT_AMBULATORY_CARE_PROVIDER_SITE_OTHER): Payer: Self-pay

## 2024-05-24 NOTE — Telephone Encounter (Signed)
 Copied from CRM #[id number]. Topic: Clinical - Order For Equipment >> May 24, 2024  4:01 PM Harlene ORN wrote: Reason for CRM: Rhonda Hickman   sent a fax over on 04/17/2024 requesting letter for medical nesessity for incontinence supplies.  Phone: [phone]

## 2024-05-26 LAB — CULTURE, BLOOD (ROUTINE X 2)
Culture: NO GROWTH
Culture: NO GROWTH
Special Requests: ADEQUATE
Special Requests: ADEQUATE

## 2024-05-28 ENCOUNTER — Telehealth: Payer: Self-pay

## 2024-05-29 ENCOUNTER — Other Ambulatory Visit: Payer: Self-pay | Admitting: Family Medicine

## 2024-05-31 ENCOUNTER — Telehealth (INDEPENDENT_AMBULATORY_CARE_PROVIDER_SITE_OTHER): Payer: Self-pay | Admitting: Primary Care

## 2024-06-04 ENCOUNTER — Ambulatory Visit (HOSPITAL_COMMUNITY)
Admit: 2024-06-04 | Discharge: 2024-06-04 | Disposition: A | Discharge status: Home / Self Care | Attending: Physician Assistant | Admitting: Physician Assistant

## 2024-06-04 VITALS — BP 118/72 | HR 68 | Ht 72.0 in | Wt 240.0 lb

## 2024-06-04 DIAGNOSIS — Z79899 Other long term (current) drug therapy: Secondary | ICD-10-CM

## 2024-06-04 DIAGNOSIS — D6869 Other thrombophilia: Secondary | ICD-10-CM | POA: Diagnosis not present

## 2024-06-04 DIAGNOSIS — I4891 Unspecified atrial fibrillation: Secondary | ICD-10-CM

## 2024-06-04 DIAGNOSIS — I4819 Other persistent atrial fibrillation: Secondary | ICD-10-CM | POA: Diagnosis not present

## 2024-06-04 DIAGNOSIS — Z5181 Encounter for therapeutic drug level monitoring: Secondary | ICD-10-CM

## 2024-06-04 DIAGNOSIS — I480 Paroxysmal atrial fibrillation: Secondary | ICD-10-CM

## 2024-06-04 LAB — CBC
Hematocrit: 31.9 % — ABNORMAL LOW (ref 34.0–46.6)
Hemoglobin: 10.2 g/dL — ABNORMAL LOW (ref 11.1–15.9)
MCH: 25.9 pg — ABNORMAL LOW (ref 26.6–33.0)
MCHC: 32 g/dL (ref 31.5–35.7)
MCV: 81 fL (ref 79–97)
Platelets: 302 x10E3/uL (ref 150–450)
RBC: 3.94 x10E6/uL (ref 3.77–5.28)
RDW: 18.3 % — ABNORMAL HIGH (ref 11.7–15.4)
WBC: 6.5 x10E3/uL (ref 3.4–10.8)

## 2024-06-04 MED ORDER — AMIODARONE HCL 200 MG PO TABS: 100.0000 mg | ORAL_TABLET | Freq: Every day | ORAL | Status: DC

## 2024-06-04 NOTE — Patient Instructions (Signed)
Decrease Amiodarone to 100 mg once a day.

## 2024-06-04 NOTE — Progress Notes (Signed)
 Primary Care Physician: Cicero Aureliano SAUNDERS, MD Primary Cardiologist: Lonni LITTIE [name], MD Electrophysiologist: None  Referring Physician: Dr Michele   Rhonda Hickman is a 68 y.o. female with a history of TIA, COPD, DM, bipolar disorder, HLD, tobacco use, asthma, atrial fibrillation who presents for follow up in the [facility].  She was in the hospital for 5 days in [place]  in May 2021. States that she was admitted with AKI and UTI and found to have atrial fibrillation. Started on Xarelto . Zio patch x14 days on 01/29/2020 showed 1% atrial flutter burden, average heart rate 86 bpm.   She presented to the [facility] emergency department on 05/14/2024 with altered mental status.  She recently had a right knee replaced, since then has been living in a rehab facility.  Reportedly she has not been acting quite right since her surgery.  The decision was made for her to get sent to the emergency department for evaluation.  She was noted to be significantly altered upon arrival. While in the ER, she was hypersomnolent, tachypneic, tachycardic, noted to be in A-fib with RVR.  She had notable rales on exam per the primary team.  She was started on IV antibiotics as well as IV amiodarone  and heparin  to help control her heart rate in the setting of A-fib.  NG tube placed for SBO. Her anticoagulation was also held for anemia requiring transfusion. Anemia felt to be chronic but acutely worsened from knee surgery. Xarelto  was resumed prior to discharge. She was also discharged on amiodarone  PO.    Patient presents today for follow up for atrial fibrillation and amiodarone  monitoring. She remains in SR today and feels great. She denies any heart racing or palpitations. She denies any overt bleeding issues.   Today, she denies symptoms of palpitations, chest pain, shortness of breath, orthopnea, PND, lower extremity edema, dizziness, presyncope, syncope, snoring,  daytime somnolence, bleeding, or neurologic sequela. The patient is tolerating medications without difficulties and is otherwise without complaint today.    Atrial Fibrillation Risk Factors:  she does not have symptoms or diagnosis of sleep apnea. she does not have a history of rheumatic fever.   Atrial Fibrillation Management history:  Previous antiarrhythmic drugs: amiodarone   Previous cardioversions: none Previous ablations: none Anticoagulation history: Xarelto   ROS- All systems are reviewed and negative except as per the HPI above.  Past Medical History:  Diagnosis Date   Anemia    Anxiety    Arthritis    Asthma    Atrial fibrillation (HCC)    Bipolar 1 disorder (HCC)    Bulging lumbar disc    Chronic pain of left knee    COPD (chronic obstructive pulmonary disease) (HCC)    Coronary artery disease    CVA (cerebral vascular accident) (HCC)    Diabetes mellitus    Dyspnea    Dysrhythmia    GERD (gastroesophageal reflux disease)    Neuropathy    TIA (transient ischemic attack)    Vertigo     Current Outpatient Medications  Medication Sig Dispense Refill   budesonide -formoterol  (SYMBICORT ) 160-4.5 MCG/ACT inhaler Inhale 2 puffs into the lungs 2 (two) times daily. 10.2 g 11   buPROPion  (WELLBUTRIN  SR) 150 MG 12 hr tablet TAKE ONE TABLET TWICE DAILY 180 tablet 0   ciprofloxacin  (CIPRO ) 500 MG tablet Take 500 mg by mouth 2 (two) times daily.     divalproex  (DEPAKOTE  ER) 500 MG 24 hr tablet Take 500-1,000 mg by mouth  See admin instructions. Take 500 mg by mouth in the morning and 1000 mg at bedtime (Patient taking differently: Take 500 mg by mouth every morning.)     FEROSUL 325 (65 Fe) MG tablet TAKE ONE TABLET BY MOUTH DAILY AT 9AM WITH BREAKFAST 90 tablet 11   insulin  aspart (NOVOLOG  FLEXPEN) 100 UNIT/ML FlexPen 0-9 Units, Subcutaneous, 3 times daily with meals CBG < 70: Implement Hypoglycemia measures CBG 70 - 120: 0 units CBG 121 - 150: 1 unit CBG 151 - 200: 2 units  CBG 201 - 250: 3 units CBG 251 - 300: 5 units CBG 301 - 350: 7 units CBG 351 - 400: 9 units CBG > 400: call MD     meclizine  (ANTIVERT ) 25 MG tablet Take 25 mg by mouth 3 (three) times daily as needed for dizziness.     metFORMIN  (GLUCOPHAGE ) 1000 MG tablet Take 1 tablet (1,000 mg total) by mouth 2 (two) times daily. TAKE ONE TABLET BY MOUTH TWICE DAILY @ 9AM & 5PM WITH MEALS 180 tablet 1   metoprolol  tartrate (LOPRESSOR ) 50 MG tablet Take 0.5 tablets (25 mg total) by mouth 2 (two) times daily.     ondansetron  (ZOFRAN ) 4 MG tablet Take 4 mg by mouth every 6 (six) hours as needed for nausea or vomiting.     oxybutynin  (DITROPAN ) 5 MG tablet Take 1 tablet (5 mg total) by mouth 2-3 (two to three) times daily for bladder spasms. (Patient taking differently: Take 5 mg by mouth 3 (three) times daily.) 180 tablet 1   oxyCODONE  (OXY IR/ROXICODONE ) 5 MG immediate release tablet Take 1-2 tablets (5-10 mg total) by mouth every 6 (six) hours as needed for moderate pain (pain score 4-6) (pain score 4-6). (Patient taking differently: Take 5-10 mg by mouth every 4 (four) hours as needed for moderate pain (pain score 4-6) (pain score 4-6).) 20 tablet 0   OZEMPIC , 0.25 OR 0.5 MG/DOSE, 2 MG/3ML SOPN INJECT 0.5MG  ONCE WEEKLY 3 mL 1   rivaroxaban  (XARELTO ) 20 MG TABS tablet TAKE ONE TABLET DAILY AT 5PM WITH SUPPER 90 tablet 0   rosuvastatin  (CRESTOR ) 10 MG tablet TAKE ONE TABLET ONCE DAILY 90 tablet 0   tiZANidine  (ZANAFLEX ) 4 MG tablet Take 1 tablet (4 mg total) by mouth every 6 (six) hours as needed for muscle spasms. 30 tablet 0   amiodarone  (PACERONE ) 200 MG tablet Take 0.5 tablets (100 mg total) by mouth daily.     No current facility-administered medications for this encounter.    Physical Exam: BP 118/72   Pulse 68   Ht 6' (1.829 m)   Wt 108.9 kg   BMI 32.55 kg/m   GEN: Well nourished, well developed in no acute distress CARDIAC: Regular rate and rhythm, no murmurs, rubs, gallops RESPIRATORY:  Clear to  auscultation without rales, wheezing or rhonchi  ABDOMEN: Soft, non-tender, non-distended EXTREMITIES:  No edema; No deformity   Wt Readings from Last 3 Encounters:  06/04/24 108.9 kg  05/14/24 106 kg  05/10/24 106 kg     EKG Interpretation Date/Time:  Tuesday June 04 2024 15:14:48 EST Ventricular Rate:  68 PR Interval:  156 QRS Duration:  92 QT Interval:  440 QTC Calculation: 467 R Axis:   -16  Text Interpretation: Normal sinus rhythm Moderate voltage criteria for LVH, may be normal variant ( R in aVL , Cornell product ) Borderline ECG When compared with ECG of 15-May-2024 06:19, Sinus rhythm has replaced Atrial flutter Confirmed by Tamieka Rancourt ([id number]) on 06/04/2024  3:26:21 PM    Echo 08/26/22 demonstrated   1. Left ventricular ejection fraction, by estimation, is 55 to 60%. The  left ventricle has normal function. The left ventricle has no regional  wall motion abnormalities. There is mild concentric left ventricular  hypertrophy. Left ventricular diastolic parameters are consistent with Grade I diastolic dysfunction (impaired relaxation).   2. Right ventricular systolic function is normal. The right ventricular  size is normal.   3. Left atrial size was mildly dilated.   4. The mitral valve is normal in structure. Trivial mitral valve  regurgitation. No evidence of mitral stenosis.   5. The aortic valve is tricuspid. Aortic valve regurgitation is not  visualized. No aortic stenosis is present.   6. The inferior vena cava is normal in size with greater than 50%  respiratory variability, suggesting right atrial pressure of 3 mmHg.    CHA2DS2-VASc Score = 7  The patient's score is based upon: CHF History: 0 HTN History: 1 Diabetes History: 1 Stroke History: 2 Vascular Disease History: 1 Age Score: 1 Gender Score: 1       ASSESSMENT AND PLAN: Persistent Atrial Fibrillation/atrial flutter (ICD10:  I48.19) The patient's CHA2DS2-VASc score is 7, indicating a 11.2%  annual risk of stroke.   Patient remains in SR today.  Suspect recent afib episode related to other acute medical issues.  Will decrease amiodarone  to 100 mg daily. If she is still in SR at her visit with Dr Kate, can consider stopping altogether.  Continue Lopressor  25 mg BID Continue Xarelto  20 mg daily  Secondary Hypercoagulable State (ICD10:  D68.69) The patient is at significant risk for stroke/thromboembolism based upon her CHA2DS2-VASc Score of 7.  Continue Rivaroxaban  (Xarelto ). Check cbc today.   High Risk Medication Monitoring (ICD 10: U5195107) Patient requires ongoing monitoring for anti-arrhythmic medication which has the potential to cause life threatening arrhythmias. Intervals on ECG acceptable for amiodarone  monitoring.    Follow up with Dr Kate as scheduled.     [facility] [address] [place], [place] [address] [phone]

## 2024-06-05 ENCOUNTER — Ambulatory Visit (HOSPITAL_COMMUNITY): Payer: Self-pay | Admitting: Physician Assistant

## 2024-06-13 ENCOUNTER — Encounter: Payer: Self-pay | Admitting: Physician Assistant

## 2024-06-13 ENCOUNTER — Ambulatory Visit: Admitting: Physician Assistant

## 2024-06-13 DIAGNOSIS — Z96651 Presence of right artificial knee joint: Secondary | ICD-10-CM

## 2024-06-13 NOTE — Progress Notes (Signed)
 HPI: Rhonda Hickman returns today 5 weeks status post right total hip arthroplasty.  She presents today with her sister.  Her sister states that Rhonda Hickman has been diagnosed with dementia.  She also reports that she is not participating with therapy at the skilled facility and only wants to sleep.  Postoperatively she was discharged to skilled facility and then readmitted due to altered mental status A-fib with RVR, acute toxic metabolic encephalopathy, lactic acidosis acute kidney injury on chronic kidney disease stage II and microcytic anemia.  Patient's med rec sheet is reviewed by myself and Joen Ned, RN and there are no pain medications listed on the med rec sheet.  Patient is unaware if she is receiving pain medicine.  Review of systems: Unable to obtain due to patient's altered mental status  Physical exam: General patient's is very sleepy and lethargic awakens and does try to answer questions sometimes appropriately but answers inappropriately. Right knee: No wound healing problems no signs of infection.  No dehiscence.  Actively able to bring leg to full extension also actively 80 degrees of flexion passively able bring her to 90 degrees flexion.  Calf supple nontender.  Impression: Altered mental status Status post right total knee arthroplasty 03/10/2024  Plan: Recommend she continue to work with therapy as she is able to work on range of motion and strengthening.  Also recommend that she follow-up with her primary care physician with her altered mental status and continued drowsiness.  Questions were encouraged and answered by Joen Ned, RN and myself.  Her sister did state that she would work on getting an appointment with patient's primary care physician.  Will see her back in 5 weeks see how she is doing overall.

## 2024-06-14 ENCOUNTER — Other Ambulatory Visit (INDEPENDENT_AMBULATORY_CARE_PROVIDER_SITE_OTHER): Payer: Self-pay | Admitting: Primary Care

## 2024-06-14 ENCOUNTER — Other Ambulatory Visit: Payer: Self-pay | Admitting: Cardiology

## 2024-06-14 DIAGNOSIS — Z760 Encounter for issue of repeat prescription: Secondary | ICD-10-CM

## 2024-06-14 DIAGNOSIS — Z720 Tobacco use: Secondary | ICD-10-CM

## 2024-06-19 NOTE — Telephone Encounter (Signed)
 Pt last saw Clint Fenton, PA on 06/04/24, last labs 05/20/24 Creat 0.84, age 68, weight 108.9kg, CrCl 111.73, based on CrCl pt is on appropriate dosage of Xarelto  20mg  every day for afib.  Will refill rx.

## 2024-06-20 ENCOUNTER — Other Ambulatory Visit

## 2024-06-26 ENCOUNTER — Other Ambulatory Visit (HOSPITAL_BASED_OUTPATIENT_CLINIC_OR_DEPARTMENT_OTHER)

## 2024-07-13 ENCOUNTER — Emergency Department (HOSPITAL_COMMUNITY)

## 2024-07-13 ENCOUNTER — Inpatient Hospital Stay (HOSPITAL_COMMUNITY)
Admission: EM | Admit: 2024-07-13 | Discharge: 2024-07-23 | DRG: 175 | Disposition: A | Discharge status: Skilled Nursing Facility | Attending: Internal Medicine | Admitting: Internal Medicine

## 2024-07-13 ENCOUNTER — Other Ambulatory Visit: Payer: Self-pay

## 2024-07-13 ENCOUNTER — Encounter (HOSPITAL_COMMUNITY): Payer: Self-pay

## 2024-07-13 DIAGNOSIS — E119 Type 2 diabetes mellitus without complications: Secondary | ICD-10-CM | POA: Diagnosis present

## 2024-07-13 DIAGNOSIS — Z833 Family history of diabetes mellitus: Secondary | ICD-10-CM

## 2024-07-13 DIAGNOSIS — E1100 Type 2 diabetes mellitus with hyperosmolarity without nonketotic hyperglycemic-hyperosmolar coma (NKHHC): Secondary | ICD-10-CM

## 2024-07-13 DIAGNOSIS — I82412 Acute embolism and thrombosis of left femoral vein: Secondary | ICD-10-CM | POA: Diagnosis present

## 2024-07-13 DIAGNOSIS — I4891 Unspecified atrial fibrillation: Secondary | ICD-10-CM

## 2024-07-13 DIAGNOSIS — R8290 Unspecified abnormal findings in urine: Secondary | ICD-10-CM | POA: Diagnosis present

## 2024-07-13 DIAGNOSIS — Z79899 Other long term (current) drug therapy: Secondary | ICD-10-CM

## 2024-07-13 DIAGNOSIS — R7989 Other specified abnormal findings of blood chemistry: Secondary | ICD-10-CM | POA: Diagnosis present

## 2024-07-13 DIAGNOSIS — I824Y2 Acute embolism and thrombosis of unspecified deep veins of left proximal lower extremity: Secondary | ICD-10-CM | POA: Diagnosis not present

## 2024-07-13 DIAGNOSIS — L89616 Pressure-induced deep tissue damage of right heel: Secondary | ICD-10-CM | POA: Diagnosis not present

## 2024-07-13 DIAGNOSIS — I5032 Chronic diastolic (congestive) heart failure: Secondary | ICD-10-CM | POA: Diagnosis present

## 2024-07-13 DIAGNOSIS — Z720 Tobacco use: Secondary | ICD-10-CM

## 2024-07-13 DIAGNOSIS — Z794 Long term (current) use of insulin: Secondary | ICD-10-CM | POA: Diagnosis not present

## 2024-07-13 DIAGNOSIS — N390 Urinary tract infection, site not specified: Principal | ICD-10-CM

## 2024-07-13 DIAGNOSIS — E66812 Obesity, class 2: Secondary | ICD-10-CM | POA: Diagnosis present

## 2024-07-13 DIAGNOSIS — K219 Gastro-esophageal reflux disease without esophagitis: Secondary | ICD-10-CM | POA: Diagnosis present

## 2024-07-13 DIAGNOSIS — I2510 Atherosclerotic heart disease of native coronary artery without angina pectoris: Secondary | ICD-10-CM | POA: Diagnosis present

## 2024-07-13 DIAGNOSIS — Z7901 Long term (current) use of anticoagulants: Secondary | ICD-10-CM | POA: Diagnosis not present

## 2024-07-13 DIAGNOSIS — L8990 Pressure ulcer of unspecified site, unspecified stage: Secondary | ICD-10-CM | POA: Diagnosis present

## 2024-07-13 DIAGNOSIS — G9340 Encephalopathy, unspecified: Secondary | ICD-10-CM | POA: Diagnosis present

## 2024-07-13 DIAGNOSIS — N179 Acute kidney failure, unspecified: Secondary | ICD-10-CM | POA: Diagnosis present

## 2024-07-13 DIAGNOSIS — G9341 Metabolic encephalopathy: Secondary | ICD-10-CM | POA: Diagnosis present

## 2024-07-13 DIAGNOSIS — J189 Pneumonia, unspecified organism: Secondary | ICD-10-CM | POA: Diagnosis present

## 2024-07-13 DIAGNOSIS — Z96653 Presence of artificial knee joint, bilateral: Secondary | ICD-10-CM | POA: Diagnosis present

## 2024-07-13 DIAGNOSIS — L89626 Pressure-induced deep tissue damage of left heel: Secondary | ICD-10-CM | POA: Diagnosis not present

## 2024-07-13 DIAGNOSIS — J449 Chronic obstructive pulmonary disease, unspecified: Secondary | ICD-10-CM | POA: Diagnosis present

## 2024-07-13 DIAGNOSIS — L89152 Pressure ulcer of sacral region, stage 2: Secondary | ICD-10-CM | POA: Diagnosis present

## 2024-07-13 DIAGNOSIS — M79661 Pain in right lower leg: Secondary | ICD-10-CM

## 2024-07-13 DIAGNOSIS — Z23 Encounter for immunization: Secondary | ICD-10-CM | POA: Diagnosis present

## 2024-07-13 DIAGNOSIS — Z7985 Long-term (current) use of injectable non-insulin antidiabetic drugs: Secondary | ICD-10-CM

## 2024-07-13 DIAGNOSIS — Z9049 Acquired absence of other specified parts of digestive tract: Secondary | ICD-10-CM

## 2024-07-13 DIAGNOSIS — F319 Bipolar disorder, unspecified: Secondary | ICD-10-CM | POA: Diagnosis present

## 2024-07-13 DIAGNOSIS — G3184 Mild cognitive impairment, so stated: Secondary | ICD-10-CM | POA: Diagnosis present

## 2024-07-13 DIAGNOSIS — F17210 Nicotine dependence, cigarettes, uncomplicated: Secondary | ICD-10-CM | POA: Diagnosis present

## 2024-07-13 DIAGNOSIS — F39 Unspecified mood [affective] disorder: Secondary | ICD-10-CM | POA: Diagnosis present

## 2024-07-13 DIAGNOSIS — L89153 Pressure ulcer of sacral region, stage 3: Secondary | ICD-10-CM | POA: Diagnosis present

## 2024-07-13 DIAGNOSIS — I2699 Other pulmonary embolism without acute cor pulmonale: Secondary | ICD-10-CM | POA: Diagnosis present

## 2024-07-13 DIAGNOSIS — J440 Chronic obstructive pulmonary disease with acute lower respiratory infection: Secondary | ICD-10-CM | POA: Diagnosis present

## 2024-07-13 DIAGNOSIS — L89892 Pressure ulcer of other site, stage 2: Secondary | ICD-10-CM | POA: Diagnosis present

## 2024-07-13 DIAGNOSIS — I824Y9 Acute embolism and thrombosis of unspecified deep veins of unspecified proximal lower extremity: Secondary | ICD-10-CM

## 2024-07-13 DIAGNOSIS — Z8249 Family history of ischemic heart disease and other diseases of the circulatory system: Secondary | ICD-10-CM

## 2024-07-13 DIAGNOSIS — Z6837 Body mass index (BMI) 37.0-37.9, adult: Secondary | ICD-10-CM

## 2024-07-13 DIAGNOSIS — I82409 Acute embolism and thrombosis of unspecified deep veins of unspecified lower extremity: Secondary | ICD-10-CM | POA: Diagnosis present

## 2024-07-13 DIAGNOSIS — E860 Dehydration: Secondary | ICD-10-CM | POA: Diagnosis present

## 2024-07-13 DIAGNOSIS — Z90710 Acquired absence of both cervix and uterus: Secondary | ICD-10-CM

## 2024-07-13 DIAGNOSIS — I482 Chronic atrial fibrillation, unspecified: Secondary | ICD-10-CM | POA: Diagnosis not present

## 2024-07-13 DIAGNOSIS — Z886 Allergy status to analgesic agent status: Secondary | ICD-10-CM

## 2024-07-13 DIAGNOSIS — Z7951 Long term (current) use of inhaled steroids: Secondary | ICD-10-CM

## 2024-07-13 DIAGNOSIS — I480 Paroxysmal atrial fibrillation: Secondary | ICD-10-CM | POA: Diagnosis present

## 2024-07-13 DIAGNOSIS — D72829 Elevated white blood cell count, unspecified: Secondary | ICD-10-CM | POA: Diagnosis not present

## 2024-07-13 DIAGNOSIS — R1310 Dysphagia, unspecified: Secondary | ICD-10-CM

## 2024-07-13 DIAGNOSIS — E669 Obesity, unspecified: Secondary | ICD-10-CM | POA: Diagnosis present

## 2024-07-13 DIAGNOSIS — Z8673 Personal history of transient ischemic attack (TIA), and cerebral infarction without residual deficits: Secondary | ICD-10-CM

## 2024-07-13 DIAGNOSIS — Z713 Dietary counseling and surveillance: Secondary | ICD-10-CM

## 2024-07-13 DIAGNOSIS — Z7984 Long term (current) use of oral hypoglycemic drugs: Secondary | ICD-10-CM

## 2024-07-13 DIAGNOSIS — R079 Chest pain, unspecified: Secondary | ICD-10-CM | POA: Diagnosis not present

## 2024-07-13 LAB — URINALYSIS, W/ REFLEX TO CULTURE (INFECTION SUSPECTED)
Glucose, UA: NEGATIVE mg/dL
Ketones, ur: 15 mg/dL — AB
Nitrite: NEGATIVE
Protein, ur: 100 mg/dL — AB
RBC / HPF: >50 RBC/hpf (ref 0–5)
Specific Gravity, Urine: >1.03 — ABNORMAL HIGH (ref 1.005–1.030)
Squamous Epithelial / HPF: >50 /HPF (ref 0–5)
WBC, UA: >50 WBC/hpf (ref 0–5)
pH: 6 (ref 5.0–8.0)

## 2024-07-13 LAB — RESP PANEL BY RT-PCR (RSV, FLU A&B, COVID)  RVPGX2
Influenza A by PCR: NEGATIVE
Influenza B by PCR: NEGATIVE
Resp Syncytial Virus by PCR: NEGATIVE
SARS Coronavirus 2 by RT PCR: NEGATIVE

## 2024-07-13 LAB — COMPREHENSIVE METABOLIC PANEL WITH GFR
ALT: 9 U/L (ref 0–44)
AST: 31 U/L (ref 15–41)
Albumin: 3.1 g/dL — ABNORMAL LOW (ref 3.5–5.0)
Alkaline Phosphatase: 61 U/L (ref 38–126)
Anion gap: 14 (ref 5–15)
BUN: 38 mg/dL — ABNORMAL HIGH (ref 8–23)
CO2: 19 mmol/L — ABNORMAL LOW (ref 22–32)
Calcium: 9.8 mg/dL (ref 8.9–10.3)
Chloride: 111 mmol/L (ref 98–111)
Creatinine, Ser: 1.35 mg/dL — ABNORMAL HIGH (ref 0.44–1.00)
GFR, Estimated: 43 mL/min — ABNORMAL LOW
Glucose, Bld: 164 mg/dL — ABNORMAL HIGH (ref 70–99)
Potassium: 4.7 mmol/L (ref 3.5–5.1)
Sodium: 144 mmol/L (ref 135–145)
Total Bilirubin: 0.3 mg/dL (ref 0.0–1.2)
Total Protein: 8.5 g/dL — ABNORMAL HIGH (ref 6.5–8.1)

## 2024-07-13 LAB — I-STAT CG4 LACTIC ACID, ED: Lactic Acid, Venous: 1.8 mmol/L (ref 0.5–1.9)

## 2024-07-13 LAB — TSH: TSH: 3.74 u[IU]/mL (ref 0.350–4.500)

## 2024-07-13 LAB — CBC WITH DIFFERENTIAL/PLATELET
Abs Immature Granulocytes: 0.06 K/uL (ref 0.00–0.07)
Basophils Absolute: 0.1 K/uL (ref 0.0–0.1)
Basophils Relative: 1 %
Eosinophils Absolute: 0.1 K/uL (ref 0.0–0.5)
Eosinophils Relative: 1 %
HCT: 41.4 % (ref 36.0–46.0)
Hemoglobin: 12.7 g/dL (ref 12.0–15.0)
Immature Granulocytes: 1 %
Lymphocytes Relative: 27 %
Lymphs Abs: 2.9 K/uL (ref 0.7–4.0)
MCH: 25.9 pg — ABNORMAL LOW (ref 26.0–34.0)
MCHC: 30.7 g/dL (ref 30.0–36.0)
MCV: 84.3 fL (ref 80.0–100.0)
Monocytes Absolute: 0.7 K/uL (ref 0.1–1.0)
Monocytes Relative: 7 %
Neutro Abs: 6.9 K/uL (ref 1.7–7.7)
Neutrophils Relative %: 63 %
Platelets: 274 K/uL (ref 150–400)
RBC: 4.91 MIL/uL (ref 3.87–5.11)
RDW: 19.1 % — ABNORMAL HIGH (ref 11.5–15.5)
WBC: 10.7 K/uL — ABNORMAL HIGH (ref 4.0–10.5)
nRBC: 0 % (ref 0.0–0.2)

## 2024-07-13 LAB — CBG MONITORING, ED: Glucose-Capillary: 151 mg/dL — ABNORMAL HIGH (ref 70–99)

## 2024-07-13 LAB — HEPARIN LEVEL (UNFRACTIONATED): Heparin Unfractionated: <0.1 [IU]/mL — ABNORMAL LOW (ref 0.30–0.70)

## 2024-07-13 LAB — PROCALCITONIN: Procalcitonin: 0.2 ng/mL

## 2024-07-13 LAB — APTT: aPTT: 58 s — ABNORMAL HIGH (ref 24–36)

## 2024-07-13 LAB — TROPONIN T, HIGH SENSITIVITY
Troponin T High Sensitivity: 29 ng/L — ABNORMAL HIGH (ref 0–19)
Troponin T High Sensitivity: 28 ng/L — ABNORMAL HIGH (ref 0–19)

## 2024-07-13 LAB — PRO BRAIN NATRIURETIC PEPTIDE: Pro Brain Natriuretic Peptide: 1443 pg/mL — ABNORMAL HIGH

## 2024-07-13 LAB — AMMONIA: Ammonia: 21 umol/L (ref 9–35)

## 2024-07-13 LAB — MAGNESIUM: Magnesium: 2.1 mg/dL (ref 1.7–2.4)

## 2024-07-13 MED ORDER — METOPROLOL TARTRATE 5 MG/5ML IV SOLN
2.5000 mg | INTRAVENOUS | Status: AC
  Administered 2024-07-13: 2.5 mg via INTRAVENOUS
  Filled 2024-07-13: qty 5

## 2024-07-13 MED ORDER — BUPROPION HCL ER (SR) 150 MG PO TB12
150.0000 mg | ORAL_TABLET | Freq: Two times a day (BID) | ORAL | Status: DC
  Administered 2024-07-14 – 2024-07-23 (×17): 150 mg via ORAL
  Filled 2024-07-13 (×18): qty 1

## 2024-07-13 MED ORDER — HEPARIN (PORCINE) 25000 UT/250ML-% IV SOLN
1700.0000 [IU]/h | INTRAVENOUS | Status: AC
  Administered 2024-07-13: 1700 [IU]/h via INTRAVENOUS
  Administered 2024-07-15: 1400 [IU]/h via INTRAVENOUS
  Filled 2024-07-13 (×3): qty 250

## 2024-07-13 MED ORDER — LACTATED RINGERS IV BOLUS (SEPSIS)
1000.0000 mL | Freq: Once | INTRAVENOUS | Status: AC
  Administered 2024-07-13: 1000 mL via INTRAVENOUS

## 2024-07-13 MED ORDER — ONDANSETRON HCL 4 MG PO TABS: 4.0000 mg | ORAL_TABLET | Freq: Four times a day (QID) | ORAL | Status: DC | PRN

## 2024-07-13 MED ORDER — SODIUM CHLORIDE 0.9 % IV SOLN
2.0000 g | INTRAVENOUS | Status: DC
  Administered 2024-07-14 – 2024-07-19 (×6): 2 g via INTRAVENOUS
  Filled 2024-07-13 (×6): qty 20

## 2024-07-13 MED ORDER — ACETAMINOPHEN 650 MG RE SUPP: 650.0000 mg | Freq: Four times a day (QID) | RECTAL | Status: DC | PRN

## 2024-07-13 MED ORDER — ACETAMINOPHEN 325 MG PO TABS
650.0000 mg | ORAL_TABLET | Freq: Four times a day (QID) | ORAL | Status: DC | PRN
  Administered 2024-07-15 – 2024-07-18 (×4): 650 mg via ORAL
  Filled 2024-07-13 (×4): qty 2

## 2024-07-13 MED ORDER — AMIODARONE HCL 200 MG PO TABS: 100.0000 mg | ORAL_TABLET | Freq: Every day | ORAL | Status: DC

## 2024-07-13 MED ORDER — SODIUM CHLORIDE 0.9 % IV SOLN
500.0000 mg | Freq: Once | INTRAVENOUS | Status: AC
  Administered 2024-07-13: 500 mg via INTRAVENOUS
  Filled 2024-07-13: qty 5

## 2024-07-13 MED ORDER — LACTATED RINGERS IV BOLUS (SEPSIS)
500.0000 mL | Freq: Once | INTRAVENOUS | Status: AC
  Administered 2024-07-13: 500 mL via INTRAVENOUS

## 2024-07-13 MED ORDER — SODIUM CHLORIDE 0.9 % IV SOLN
100.0000 mg | Freq: Two times a day (BID) | INTRAVENOUS | Status: DC
  Administered 2024-07-13 – 2024-07-16 (×6): 100 mg via INTRAVENOUS
  Filled 2024-07-13 (×7): qty 100

## 2024-07-13 MED ORDER — INSULIN ASPART 100 UNIT/ML IJ SOLN: 0.0000 [IU] | Freq: Every day | INTRAMUSCULAR | Status: DC

## 2024-07-13 MED ORDER — SODIUM CHLORIDE 0.9% FLUSH
3.0000 mL | Freq: Two times a day (BID) | INTRAVENOUS | Status: DC
  Administered 2024-07-14 – 2024-07-23 (×17): 3 mL via INTRAVENOUS

## 2024-07-13 MED ORDER — LACTATED RINGERS IV SOLN: INTRAVENOUS | Status: AC

## 2024-07-13 MED ORDER — SODIUM CHLORIDE 0.9 % IV SOLN
2.0000 g | Freq: Once | INTRAVENOUS | Status: AC
  Administered 2024-07-13: 2 g via INTRAVENOUS
  Filled 2024-07-13: qty 20

## 2024-07-13 MED ORDER — METOPROLOL TARTRATE 25 MG PO TABS
25.0000 mg | ORAL_TABLET | Freq: Two times a day (BID) | ORAL | Status: DC
  Administered 2024-07-14 – 2024-07-15 (×3): 25 mg via ORAL
  Filled 2024-07-13 (×3): qty 1

## 2024-07-13 MED ORDER — AMIODARONE HCL IN DEXTROSE 360-4.14 MG/200ML-% IV SOLN
30.0000 mg/h | INTRAVENOUS | Status: DC
  Administered 2024-07-13 – 2024-07-15 (×4): 30 mg/h via INTRAVENOUS
  Filled 2024-07-13 (×6): qty 200

## 2024-07-13 MED ORDER — INSULIN ASPART 100 UNIT/ML IJ SOLN
0.0000 [IU] | Freq: Three times a day (TID) | INTRAMUSCULAR | Status: DC
  Administered 2024-07-13 – 2024-07-14 (×3): 2 [IU] via SUBCUTANEOUS
  Administered 2024-07-14: 1 [IU] via SUBCUTANEOUS
  Administered 2024-07-15 – 2024-07-16 (×4): 2 [IU] via SUBCUTANEOUS
  Administered 2024-07-16: 1 [IU] via SUBCUTANEOUS
  Administered 2024-07-16 – 2024-07-18 (×4): 2 [IU] via SUBCUTANEOUS
  Administered 2024-07-18: 3 [IU] via SUBCUTANEOUS
  Administered 2024-07-18 – 2024-07-19 (×2): 1 [IU] via SUBCUTANEOUS
  Administered 2024-07-19: 2 [IU] via SUBCUTANEOUS
  Administered 2024-07-21 – 2024-07-23 (×2): 1 [IU] via SUBCUTANEOUS
  Filled 2024-07-13 (×2): qty 2
  Filled 2024-07-13: qty 1
  Filled 2024-07-13: qty 2
  Filled 2024-07-13 (×2): qty 1
  Filled 2024-07-13 (×3): qty 2
  Filled 2024-07-13 (×2): qty 1
  Filled 2024-07-13: qty 2
  Filled 2024-07-13: qty 3
  Filled 2024-07-13 (×4): qty 2
  Filled 2024-07-13: qty 1
  Filled 2024-07-13: qty 2

## 2024-07-13 MED ORDER — SODIUM CHLORIDE 0.9 % IV BOLUS
500.0000 mL | Freq: Once | INTRAVENOUS | Status: AC
  Administered 2024-07-13: 500 mL via INTRAVENOUS

## 2024-07-13 MED ORDER — AMIODARONE HCL IN DEXTROSE 360-4.14 MG/200ML-% IV SOLN
60.0000 mg/h | INTRAVENOUS | Status: AC
  Administered 2024-07-13: 60 mg/h via INTRAVENOUS
  Filled 2024-07-13: qty 200

## 2024-07-13 MED ORDER — ONDANSETRON HCL 4 MG/2ML IJ SOLN: 4.0000 mg | Freq: Four times a day (QID) | INTRAMUSCULAR | Status: DC | PRN

## 2024-07-13 MED ORDER — DIVALPROEX SODIUM ER 500 MG PO TB24
500.0000 mg | ORAL_TABLET | Freq: Every morning | ORAL | Status: DC
  Administered 2024-07-14 – 2024-07-23 (×10): 500 mg via ORAL
  Filled 2024-07-13 (×10): qty 1

## 2024-07-13 MED ORDER — IOHEXOL 350 MG/ML SOLN
75.0000 mL | Freq: Once | INTRAVENOUS | Status: AC | PRN
  Administered 2024-07-13: 75 mL via INTRAVENOUS

## 2024-07-13 NOTE — Progress Notes (Signed)
 ANTICOAGULATION CONSULT NOTE  Pharmacy Consult for Heparin  Indication: pulmonary embolus  Allergies[1]  Patient Measurements:   Heparin  Dosing Weight: 101.4 kg  Vital Signs: Temp: 97.4 F (36.3 C) (01/03 1313) Temp Source: Oral (01/03 1313) BP: 125/98 (01/03 1312) Pulse Rate: 142 (01/03 1312)  Labs: Recent Labs    07/13/24 0834  HGB 12.7  HCT 41.4  PLT 274  CREATININE 1.35*    CrCl cannot be calculated (Unknown ideal weight.).   Medical History: Past Medical History:  Diagnosis Date   Anemia    Anxiety    Arthritis    Asthma    Atrial fibrillation (HCC)    Bipolar 1 disorder (HCC)    Bulging lumbar disc    Chronic pain of left knee    COPD (chronic obstructive pulmonary disease) (HCC)    Coronary artery disease    CVA (cerebral vascular accident) (HCC)    Diabetes mellitus    Dyspnea    Dysrhythmia    GERD (gastroesophageal reflux disease)    Neuropathy    TIA (transient ischemic attack)    Vertigo     Medications:  (Not in a hospital admission)  Scheduled:   amiodarone   100 mg Oral Daily   metoprolol  tartrate  25 mg Oral BID   sodium chloride  flush  3 mL Intravenous Q12H   Infusions:   azithromycin  500 mg (07/13/24 1333)   [START ON 07/14/2024] cefTRIAXone  (ROCEPHIN )  IV     lactated ringers  1,000 mL (07/13/24 1410)   And   lactated ringers      lactated ringers      PRN: acetaminophen  **OR** acetaminophen , ondansetron  **OR** ondansetron  (ZOFRAN ) IV  Assessment: 52 yof with a history of HF, AF on Xarelto , bipolar, GERD, knee surgery 2 months ago. Heparin  per pharmacy consult placed for pulmonary embolus. CT w/ PE no RHS. US  w/ Left DVT.  Patient is on xarelto  prior to arrival. Last dose more than a week ago per patient though she is reported to be altered. Will require aPTT monitoring due to likely falsely high anti-Xa level secondary to DOAC use.  Hgb 12.7; plt 274  Goal of Therapy:  Heparin  level 0.3-0.7 units/ml aPTT 66-102  seconds Monitor platelets by anticoagulation protocol: Yes   Plan:  No initial heparin  bolus Start heparin  infusion at 1700 units/hr Check aPTT & anti-Xa level in 8 hours and daily while on heparin  Continue to monitor via aPTT until levels are correlated Continue to monitor H&H and platelets  Dorn Buttner, PharmD, BCPS 07/13/2024 2:14 PM ED Clinical Pharmacist -  [phone]        [1]  Allergies Allergen Reactions   Aspirin  Nausea Only and Other (See Comments)    Causes stomach pain

## 2024-07-13 NOTE — ED Notes (Signed)
 Family at bedside.

## 2024-07-13 NOTE — ED Notes (Signed)
 Pt had brown medium BM, RN and NT cleaned pt. New chuck, diaper applied. Peri care performed.

## 2024-07-13 NOTE — ED Triage Notes (Signed)
 Staff called out for tachycardia for a few days. Dx with UTI a week ago and finished abx. Hx of afib. HR 140's. Staff states left arm is typically swollen, pt has midline in left that arm.  EMS gave 50cc of NS. Axox2 at baseline.

## 2024-07-13 NOTE — Progress Notes (Signed)
 At bedside for L midline evaluation. Device placed at outside facility. Upon assessment, L upper arm with significant swelling with no blood return and unable to flush. Removed line. Insertion site clean, dry and intact. RN aware.

## 2024-07-13 NOTE — Progress Notes (Signed)
 Elink following for sepsis protocol.

## 2024-07-13 NOTE — Progress Notes (Signed)
 VASCULAR LAB    Bilateral lower extremity venous duplex has been performed.  See CV proc for preliminary results.  Gave verbal report to Dr Ginger LIS, Paizlee Kinder, RVT 07/13/2024, 11:41 AM

## 2024-07-13 NOTE — ED Provider Notes (Signed)
 " [facility] EMERGENCY DEPARTMENT AT [facility] Provider Note   CSN: [id number] Arrival date & time: 07/13/24  9196     Patient presents with: No chief complaint on file.   Rhonda Hickman is a 69 y.o. female.   The history is provided by the patient and medical records. The history is limited by the condition of the patient. No language interpreter was used.  Leg Pain Location:  Ankle Injury: no   Ankle location:  R ankle Pain details:    Severity:  Unable to specify   Onset quality:  Unable to specify   Progression:  Unable to specify Associated symptoms: fatigue   Associated symptoms: no back pain, no fever and no neck pain        Prior to Admission medications  Medication Sig Start Date End Date Taking? Authorizing Provider  amiodarone  (PACERONE ) 200 MG tablet Take 0.5 tablets (100 mg total) by mouth daily. 06/04/24   Fenton, Clint R, PA  budesonide -formoterol  (SYMBICORT ) 160-4.5 MCG/ACT inhaler Inhale 2 puffs into the lungs 2 (two) times daily. 03/10/24   Celestia Rosaline SQUIBB, NP  buPROPion  (WELLBUTRIN  SR) 150 MG 12 hr tablet TAKE ONE TABLET TWICE DAILY 03/12/24   Celestia Rosaline SQUIBB, NP  ciprofloxacin  (CIPRO ) 500 MG tablet Take 500 mg by mouth 2 (two) times daily.    [provider]  divalproex  (DEPAKOTE  ER) 500 MG 24 hr tablet Take 500-1,000 mg by mouth See admin instructions. Take 500 mg by mouth in the morning and 1000 mg at bedtime Patient taking differently: Take 500 mg by mouth every morning. 05/19/21   [provider]  FEROSUL 325 (65 Fe) MG tablet TAKE ONE TABLET BY MOUTH DAILY AT 9AM WITH BREAKFAST 02/16/23   Celestia Rosaline SQUIBB, NP  insulin  aspart (NOVOLOG  FLEXPEN) 100 UNIT/ML FlexPen 0-9 Units, Subcutaneous, 3 times daily with meals CBG < 70: Implement Hypoglycemia measures CBG 70 - 120: 0 units CBG 121 - 150: 1 unit CBG 151 - 200: 2 units CBG 201 - 250: 3 units CBG 251 - 300: 5 units CBG 301 - 350: 7 units CBG 351 - 400: 9 units CBG > 400:  call MD 05/22/24   Raenelle Donalda HERO, MD  meclizine  (ANTIVERT ) 25 MG tablet Take 25 mg by mouth 3 (three) times daily as needed for dizziness.    [provider]  metFORMIN  (GLUCOPHAGE ) 1000 MG tablet Take 1 tablet (1,000 mg total) by mouth 2 (two) times daily. TAKE ONE TABLET BY MOUTH TWICE DAILY @ 9AM & 5PM WITH MEALS 08/09/23   Celestia Rosaline SQUIBB, NP  metoprolol  tartrate (LOPRESSOR ) 50 MG tablet Take 0.5 tablets (25 mg total) by mouth 2 (two) times daily. Pt must keep upcoming followup appt with Cardiology in January 2026 for any more refills. Thank You 06/19/24   Kate Lonni CROME, MD  ondansetron  (ZOFRAN ) 4 MG tablet Take 4 mg by mouth every 6 (six) hours as needed for nausea or vomiting.    [provider]  oxybutynin  (DITROPAN ) 5 MG tablet Take 1 tablet (5 mg total) by mouth 2-3 (two to three) times daily for bladder spasms. Patient taking differently: Take 5 mg by mouth 3 (three) times daily. 03/07/24   Celestia Rosaline SQUIBB, NP  oxyCODONE  (OXY IR/ROXICODONE ) 5 MG immediate release tablet Take 1-2 tablets (5-10 mg total) by mouth every 6 (six) hours as needed for moderate pain (pain score 4-6) (pain score 4-6). Patient taking differently: Take 5-10 mg by mouth every 4 (four) hours  as needed for moderate pain (pain score 4-6) (pain score 4-6). 05/22/24   Ghimire, Donalda HERO, MD  OZEMPIC , 0.25 OR 0.5 MG/DOSE, 2 MG/3ML SOPN INJECT 0.5MG  ONCE WEEKLY 05/30/24   Celestia Rosaline SQUIBB, NP  rivaroxaban  (XARELTO ) 20 MG TABS tablet TAKE ONE TABLET DAILY AT 5PM WITH SUPPER 06/19/24   Kate Lonni CROME, MD  rosuvastatin  (CRESTOR ) 10 MG tablet Take 1 tablet (10 mg total) by mouth daily. Pt must keep upcoming followup appt with Cardiology in January 2026 for any more refills. Thank You 06/19/24   Kate Lonni CROME, MD  tiZANidine  (ZANAFLEX ) 4 MG tablet Take 1 tablet (4 mg total) by mouth every 6 (six) hours as needed for muscle spasms. 05/13/24   Vernetta Lonni GRADE, MD     Allergies: Aspirin     Review of Systems  Unable to perform ROS: Mental status change  Constitutional:  Positive for chills and fatigue. Negative for fever.  HENT:  Negative for congestion.   Respiratory:  Negative for cough, chest tightness, shortness of breath and wheezing.   Cardiovascular:  Negative for chest pain and palpitations.  Gastrointestinal:  Negative for abdominal pain, constipation, diarrhea, nausea and vomiting.  Genitourinary:  Negative for dysuria, flank pain and frequency.  Musculoskeletal:  Negative for back pain, neck pain and neck stiffness.  Skin:  Negative for rash and wound.  Neurological:  Negative for headaches.  Psychiatric/Behavioral:  Negative for agitation.   All other systems reviewed and are negative.   Updated Vital Signs BP (!) 125/98 (BP Location: Left Arm)   Pulse (!) 142   Temp (!) 97.4 F (36.3 C) (Oral)   Resp (!) 22   SpO2 98%   Physical Exam Vitals and nursing note reviewed.  Constitutional:      General: She is not in acute distress.    Appearance: She is well-developed. She is not ill-appearing, toxic-appearing or diaphoretic.  HENT:     Head: Normocephalic and atraumatic.     Nose: No congestion or rhinorrhea.     Mouth/Throat:     Mouth: Mucous membranes are dry.     Pharynx: No oropharyngeal exudate or posterior oropharyngeal erythema.  Eyes:     Extraocular Movements: Extraocular movements intact.     Conjunctiva/sclera: Conjunctivae normal.     Pupils: Pupils are equal, round, and reactive to light.  Cardiovascular:     Rate and Rhythm: Regular rhythm. Tachycardia present.     Heart sounds: No murmur heard. Pulmonary:     Effort: Pulmonary effort is normal. No respiratory distress.     Breath sounds: Normal breath sounds. No wheezing, rhonchi or rales.  Chest:     Chest wall: No tenderness.  Abdominal:     General: Abdomen is flat.     Palpations: Abdomen is soft.     Tenderness: There is no abdominal tenderness.  There is no guarding or rebound.  Musculoskeletal:        General: Tenderness present. No swelling.     Cervical back: Neck supple. No tenderness.     Right lower leg: No edema.     Left lower leg: No edema.  Skin:    General: Skin is warm and dry.     Capillary Refill: Capillary refill takes less than 2 seconds.     Coloration: Skin is not pale.     Findings: No erythema or rash.  Neurological:     Mental Status: She is alert. Mental status is at baseline.     Motor:  No weakness.  Psychiatric:        Mood and Affect: Mood normal.     (all labs ordered are listed, but only abnormal results are displayed) Labs Reviewed  CBC WITH DIFFERENTIAL/PLATELET - Abnormal; Notable for the following components:      Result Value   WBC 10.7 (*)    MCH 25.9 (*)    RDW 19.1 (*)    All other components within normal limits  COMPREHENSIVE METABOLIC PANEL WITH GFR - Abnormal; Notable for the following components:   CO2 19 (*)    Glucose, Bld 164 (*)    BUN 38 (*)    Creatinine, Ser 1.35 (*)    Total Protein 8.5 (*)    Albumin  3.1 (*)    GFR, Estimated 43 (*)    All other components within normal limits  URINALYSIS, W/ REFLEX TO CULTURE (INFECTION SUSPECTED) - Abnormal; Notable for the following components:   APPearance TURBID (*)    Specific Gravity, Urine >1.030 (*)    Hgb urine dipstick LARGE (*)    Bilirubin Urine SMALL (*)    Ketones, ur 15 (*)    Protein, ur 100 (*)    Leukocytes,Ua LARGE (*)    Non Squamous Epithelial PRESENT (*)    Bacteria, UA MANY (*)    All other components within normal limits  TROPONIN T, HIGH SENSITIVITY - Abnormal; Notable for the following components:   Troponin T High Sensitivity 29 (*)    All other components within normal limits  RESP PANEL BY RT-PCR (RSV, FLU A&B, COVID)  RVPGX2  CULTURE, BLOOD (SINGLE)  URINE CULTURE  TSH  MAGNESIUM   AMMONIA  PROCALCITONIN  I-STAT CG4 LACTIC ACID, ED    EKG: EKG Interpretation Date/Time:  Saturday  July 13 2024 08:25:58 EST Ventricular Rate:  141 PR Interval:  97 QRS Duration:  146 QT Interval:  324 QTC Calculation: 497 R Axis:   -27  Text Interpretation: afib IVCD, consider atypical RBBB LVH with secondary repolarization abnormality when compared to prior, appears to show aflutter with RVR NO STEMI Confirmed by Ginger Barefoot ([id number]) on 07/13/2024 8:32:57 AM  Radiology: VAS US  LOWER EXTREMITY VENOUS (DVT) (ONLY [facility] & [facility]) Result Date: 07/13/2024  Lower Venous DVT Study Patient Name:  JOELIE SCHOU  Date of Exam:   07/13/2024 Medical Rec #: [id number]       Accession #:    [id number] Date of Birth: 1955-11-16       Patient Gender: F Patient Age:   68 years Exam Location:  [facility] Procedure:      VAS US  LOWER EXTREMITY VENOUS (DVT) Referring Phys: LONNI GINGER --------------------------------------------------------------------------------  Indications: Tachycardia, and pulmonary embolism. Other Indications: Right total hip arthroplasty 5 weeks ago. Dementia. Currently                    resides in skilled nursing facility. Limitations: Body habitus and Patient condition. Comparison       No prior Right LEV on file. Prior negative left LEV done Study:           06/29/23 Performing Technologist: Alberta Lis RVS  Examination Guidelines: A complete evaluation includes B-mode imaging, spectral Doppler, color Doppler, and power Doppler as needed of all accessible portions of each vessel. Bilateral testing is considered an integral part of a complete examination. Limited examinations for reoccurring indications may be performed as noted. The reflux portion of the exam is performed with the patient in reverse Trendelenburg.  +---------+---------------+---------+-----------+----------+-------------------+ RIGHT  CompressibilityPhasicitySpontaneityPropertiesThrombus Aging      +---------+---------------+---------+-----------+----------+-------------------+ CFV      Full            Yes      Yes                                      +---------+---------------+---------+-----------+----------+-------------------+ SFJ      Full                                                             +---------+---------------+---------+-----------+----------+-------------------+ FV Prox  Full           Yes      Yes                                      +---------+---------------+---------+-----------+----------+-------------------+ FV Mid   Full           Yes      Yes                                      +---------+---------------+---------+-----------+----------+-------------------+ FV DistalFull           Yes      Yes                                      +---------+---------------+---------+-----------+----------+-------------------+ PFV      Full                                                             +---------+---------------+---------+-----------+----------+-------------------+ POP                     Yes      Yes                  patent by color and                                                       Doppler             +---------+---------------+---------+-----------+----------+-------------------+ PTV      Full                                                             +---------+---------------+---------+-----------+----------+-------------------+ PERO  Not well visualized +---------+---------------+---------+-----------+----------+-------------------+   +---------+---------------+---------+-----------+----------+-------------------+ LEFT     CompressibilityPhasicitySpontaneityPropertiesThrombus Aging      +---------+---------------+---------+-----------+----------+-------------------+ CFV      Partial        Yes      Yes                  Acute distal                                                              portion of the vein  +---------+---------------+---------+-----------+----------+-------------------+ SFJ      Full                                                             +---------+---------------+---------+-----------+----------+-------------------+ FV Prox  Partial        Yes      Yes                  Acute               +---------+---------------+---------+-----------+----------+-------------------+ FV Mid   Full                                                             +---------+---------------+---------+-----------+----------+-------------------+ FV DistalFull           Yes      Yes                                      +---------+---------------+---------+-----------+----------+-------------------+ PFV      Partial                                      Acute               +---------+---------------+---------+-----------+----------+-------------------+ POP                     Yes      Yes                  patent by color and                                                       Doppler             +---------+---------------+---------+-----------+----------+-------------------+ PTV      Full                                                             +---------+---------------+---------+-----------+----------+-------------------+  PERO     Full                                                             +---------+---------------+---------+-----------+----------+-------------------+     Summary: RIGHT: - There is no evidence of deep vein thrombosis in the lower extremity.  LEFT: - Findings consistent with acute deep vein thrombosis involving the left common femoral vein, left femoral vein, and left proximal profunda vein.   *See table(s) above for measurements and observations.    Preliminary    CT Angio Chest PE W and/or Wo Contrast Result Date: 07/13/2024 EXAM: CTA CHEST 07/13/2024 12:33:01 PM TECHNIQUE: CTA of the chest was performed without and with the  administration of 75 mL of intravenous contrast (iohexol  (OMNIPAQUE ) 350 MG/ML injection 75 mL IOHEXOL  350 MG/ML SOLN). Multiplanar reformatted images are provided for review. MIP images are provided for review. Automated exposure control, iterative reconstruction, and/or weight based adjustment of the mA/kV was utilized to reduce the radiation dose to as low as reasonably achievable. COMPARISON: None available. CLINICAL HISTORY: Pulmonary embolism (PE) suspected, high prob; DVT on US  with tachycardia. Rule out PE. FINDINGS: PULMONARY ARTERIES: Pulmonary arteries are adequately opacified for evaluation. Right lower lobe subsegmental pulmonary embolus. Main pulmonary artery is normal in caliber. MEDIASTINUM: Prominent left ventricle with question of left ventricular hypertrophy. No enlarged RV to LV ratio. The pericardium demonstrates no acute abnormality. There is no acute abnormality of the thoracic aorta. Atherosclerotic plaque in the aorta. Left anterior descending coronary artery calcification. LYMPH NODES: No mediastinal, hilar or axillary lymphadenopathy. LUNGS AND PLEURA: Left lower lobe soft tissue density versus mucous plugging within the left lower lobe bronchiole (4.71 cm). Left lower lobe atelectasis. Right lower lobe peribronchovascular tree-in-bud nodularity. No focal consolidation or pulmonary edema. No evidence of pleural effusion or pneumothorax. UPPER ABDOMEN: Limited images of the upper abdomen are unremarkable. Status post cholecystectomy. SOFT TISSUES AND BONES: No acute bone or soft tissue abnormality. IMPRESSION: 1. Right lower lobe subsegmental pulmonary embolus. No associated right heart strain or pulmonary infarction. 2. Left lower lobe soft tissue density versus mucous plugging within a left lower lobe bronchiole. 3. Right lower lobe peribronchovascular tree-in-bud nodularity. Findings suggestive of infection/inflammation. Electronically signed by: Morgane Naveau MD 07/13/2024 12:43 PM EST  RP Workstation: [id number]   CT Head Wo Contrast Result Date: 07/13/2024 EXAM: CT HEAD WITHOUT 07/13/2024 10:35:46 AM TECHNIQUE: CT of the head was performed without the administration of intravenous contrast. Automated exposure control, iterative reconstruction, and/or weight based adjustment of the mA/kV was utilized to reduce the radiation dose to as low as reasonably achievable. COMPARISON: 05/14/2024 CLINICAL HISTORY: Mental status change, persistent or worsening; tachycardia, waxing and waning AMS. FINDINGS: BRAIN AND VENTRICLES: No acute intracranial hemorrhage. No mass effect or midline shift. No extra-axial fluid collection. No evidence of acute infarct. Hypoattenuating foci in the cerebral white matter, most likely representing chronic small vessel disease. Prominence of the sulci and ventricles is compatible with brain atrophy. No hydrocephalus. ORBITS: No acute abnormality. SINUSES AND MASTOIDS: Paranasal sinuses and mastoid air cells are clear. SOFT TISSUES AND SKULL: No acute skull fracture. No acute soft tissue abnormality. IMPRESSION: 1. No acute intracranial abnormality. Electronically signed by: Waddell Calk MD 07/13/2024 11:02 AM EST RP Workstation: [id number]   DG Tibia/Fibula Right Result Date:  07/13/2024 CLINICAL DATA:  Right leg pain EXAM: RIGHT TIBIA AND FIBULA - 2 VIEW COMPARISON:  None Available. FINDINGS: Status post right total knee arthroplasty. No fracture or dislocation is noted. IMPRESSION: No acute abnormality seen. Electronically Signed   By: Lynwood Landy Raddle M.D.   On: 07/13/2024 10:29   DG Ankle Complete Right Result Date: 07/13/2024 CLINICAL DATA:  Right ankle pain EXAM: RIGHT ANKLE - COMPLETE 3+ VIEW COMPARISON:  None Available. FINDINGS: There is no evidence of fracture, dislocation, or joint effusion. There is no evidence of arthropathy or other focal bone abnormality. Soft tissues are unremarkable. IMPRESSION: Negative. Electronically Signed   By: Lynwood Landy Raddle M.D.    On: 07/13/2024 10:27   DG Chest Portable 1 View Result Date: 07/13/2024 CLINICAL DATA:  Tachycardia EXAM: PORTABLE CHEST 1 VIEW COMPARISON:  May 21, 2024 FINDINGS: The heart size and mediastinal contours are within normal limits. Right lung is clear. Minimal left basilar subsegmental atelectasis or scarring is noted. The visualized skeletal structures are unremarkable. IMPRESSION: Minimal left basilar subsegmental atelectasis or scarring. Electronically Signed   By: Lynwood Landy Raddle M.D.   On: 07/13/2024 10:26     Procedures   CRITICAL CARE Performed by: Lonni PARAS Dhalia Zingaro Total critical care time: 35 minutes Critical care time was exclusive of separately billable procedures and treating other patients. Critical care was necessary to treat or prevent imminent or life-threatening deterioration. Critical care was time spent personally by me on the following activities: development of treatment plan with patient and/or surrogate as well as nursing, discussions with consultants, evaluation of patient's response to treatment, examination of patient, obtaining history from patient or surrogate, ordering and performing treatments and interventions, ordering and review of laboratory studies, ordering and review of radiographic studies, pulse oximetry and re-evaluation of patient's condition.  Medications Ordered in the ED  lactated ringers  infusion (has no administration in time range)  lactated ringers  bolus 1,000 mL (0 mLs Intravenous Stopped 07/13/24 1358)    And  lactated ringers  bolus 1,000 mL (0 mLs Intravenous Stopped 07/13/24 1400)    And  lactated ringers  bolus 1,000 mL (1,000 mLs Intravenous New Bag/Given 07/13/24 1410)    And  lactated ringers  bolus 500 mL (500 mLs Intravenous New Bag/Given 07/13/24 1437)  sodium chloride  flush (NS) 0.9 % injection 3 mL (3 mLs Intravenous Not Given 07/13/24 1419)  acetaminophen  (TYLENOL ) tablet 650 mg (has no administration in time range)    Or   acetaminophen  (TYLENOL ) suppository 650 mg (has no administration in time range)  ondansetron  (ZOFRAN ) tablet 4 mg (has no administration in time range)    Or  ondansetron  (ZOFRAN ) injection 4 mg (has no administration in time range)  amiodarone  (PACERONE ) tablet 100 mg (0 mg Oral Hold 07/13/24 1419)  metoprolol  tartrate (LOPRESSOR ) tablet 25 mg (0 mg Oral Hold 07/13/24 1420)  cefTRIAXone  (ROCEPHIN ) 2 g in sodium chloride  0.9 % 100 mL IVPB (has no administration in time range)  metoprolol  tartrate (LOPRESSOR ) injection 2.5 mg (has no administration in time range)  insulin  aspart (novoLOG ) injection 0-9 Units (has no administration in time range)  insulin  aspart (novoLOG ) injection 0-5 Units (has no administration in time range)  sodium chloride  0.9 % bolus 500 mL (0 mLs Intravenous Stopped 07/13/24 1147)  iohexol  (OMNIPAQUE ) 350 MG/ML injection 75 mL (75 mLs Intravenous Contrast Given 07/13/24 1229)  cefTRIAXone  (ROCEPHIN ) 2 g in sodium chloride  0.9 % 100 mL IVPB (0 g Intravenous Stopped 07/13/24 1358)  azithromycin  (ZITHROMAX ) 500 mg in  sodium chloride  0.9 % 250 mL IVPB (500 mg Intravenous New Bag/Given 07/13/24 1333)                                    Medical Decision Making Amount and/or Complexity of Data Reviewed Labs: ordered. Radiology: ordered.  Risk Prescription drug management. Decision regarding hospitalization.    Rhonda Hickman is a 69 y.o. female with a complex past medical history including hyperlipidemia, hypertension, diabetes, GERD, chronic A-fib on Xarelto , migraines, diabetic neuropathy, previous TIA, previous small bowel obstruction, vertigo, asthma, COPD, bipolar disorder, previous hysterectomy, previous cholecystectomy, and previous right knee surgery just over 2 months ago who presents for tachycardia.  According to EMS report and nursing, patient was found to be tachycardic for the last few days with heart rate into the 140s.  She reportedly had a UTI last week and  completed antibiotics.  She reportedly is only alert and oriented x 2 at baseline and has been grossly at her baseline but does seem somewhat somnolent at times here.  Patient was given some fluids with EMS and heart rate is still in the 140s.  She is able to answer some questions for me but has difficulty otherwise.  Patient is denying any fevers but did say yes to some chills.  She is denying any headache or neck pain and shook her head no to any chest pain shortness of breath cough or abdominal pain.  She was not focally tender in her chest or abdomen or hips.  She did not have tenderness of her knees.  She does have tenderness in her right ankle and right shin.  I did not see any fluctuance or crepitance.  She did have distal pulse on exam and did move her feet for me.  Pupils are symmetric and reactive and patient was able to answer some questions.  Of note, her left arm is swollen and she nods yes that it is normally like this and is not different than baseline.  Has a midline in that arm.  Given the patient's tachycardia and recent infection I am concerned about some persistent infection.  Will get labs including urinalysis, will get a chest x-ray with the tachycardia, and will get imaging of her right shin and right ankle for the tenderness and pain.  Will get a DVT ultrasound given her recent right leg surgery although she is on Xarelto .  Will give her a small amount of fluid to see if this helps with the heart rate.  Will also get ammonia and TSH as it appears she has had metabolic encephalopathy requiring admission last month.  Anticipate reassessment after workup to determine disposition.  1:18 PM Workup continues to return.  The ultrasound of the leg did show concern for a left-sided DVT instead of the right side.  With her tachycardia and respiratory symptoms a CT PE study was ordered and does show both pulmonary embolism and possible infection.  With her white count, tachycardia, tachypnea,  and suspected UTI as well, will order broad-spectrum antibiotics and call for admission.     Final diagnoses:  Urinary tract infection without hematuria, site unspecified  Pneumonia due to infectious organism, unspecified laterality, unspecified part of lung  Atrial fibrillation with RVR (HCC)  Deep vein thrombosis (DVT) of proximal lower extremity, unspecified chronicity, unspecified laterality (HCC)  Acute pulmonary embolism without acute cor pulmonale, unspecified pulmonary embolism type (HCC)    Clinical Impression:  1. Urinary tract infection without hematuria, site unspecified   2. Pneumonia due to infectious organism, unspecified laterality, unspecified part of lung   3. Atrial fibrillation with RVR (HCC)   4. Deep vein thrombosis (DVT) of proximal lower extremity, unspecified chronicity, unspecified laterality (HCC)   5. Acute pulmonary embolism without acute cor pulmonale, unspecified pulmonary embolism type (HCC)     Disposition: Admit  This note was prepared with assistance of Dragon voice recognition software. Occasional wrong-word or sound-a-like substitutions may have occurred due to the inherent limitations of voice recognition software.     Jacquilyn Seldon, Lonni PARAS, MD 07/13/24 1447  "

## 2024-07-13 NOTE — H&P (Addendum)
 " History and Physical    Patient: Rhonda Hickman FMW:[id number] DOB: November 06, 1955 DOA: 07/13/2024 DOS: the patient was seen and examined on 07/13/2024 PCP: Cicero Aureliano SAUNDERS, MD  Patient coming from: Skilled nursing facility via EMS  Chief Complaint: Chest pain  HPI: Rhonda Hickman is a 69 y.o. female with medical history significant of diastolic congestive heart failure, atrial fibrillation on Xarelto , bipolar disorder, GERD, and status post right knee replacement on 05/10/2024 who presents with complaints of chest pain.  History is limited from the patient as she appears to be confused usually alert and oriented to person and place which is reported to be her baseline per staff.  She had just recently been diagnosed with a urinary tract infection along with dehydration last week and completed a course of IV Rocephin  on the first of this month.  Apparently patient had been found to be more tachycardic this morning.  She reported some complaints of chest pain as well as cough.  Patient was reported to have left arm swelling as well where she had a midline in place.  With EMS heart rates were noted to be in the 140s and atrial fibrillation.  Patient had been given 50 cc of normal saline IV fluids.  In the emergency department patient was noted to be afebrile with heart rates elevated into the 140s, respirations 14-22, and all other vital signs maintained.  Labs significant for WBC 10.7, BUN 38, creatinine 1.35, glucose 164, ammonia 21 high-sensitivity troponin 29, and lactic acid 1.8.  Influenza, COVID-19, RSV screening were negative.  Chest x-ray noted minimal left basilar segmental atelectasis or scarring.  Urinalysis positive for large leukocytes, specific gravity greater than 1.03, many bacteria, greater than 50 RBC/hpf, greater than 50 squamous epithelial cells/hpf, and greater than 50 WBCs.  CT angiogram of the chest noted a right lower lobe segmental pulmonary embolus with no associated right heart  strain, right lower lobe peribronchiolar tree-in-bud nodularity concerning for infection versus inflammation, and left lower lobe soft tissue density versus mucous plugging of the left lower lobe.  Patient was ordered 4 L of IV fluid, Rocephin , and azithromycin .  Midline was removed.  Doppler ultrasound of the lower extremities revealed a acute DVT in the left common femoral, femoral, and proximal profunda veins.  Review of Systems: unable to review all systems due to the inability of the patient to answer questions. Past Medical History:  Diagnosis Date   Anemia    Anxiety    Arthritis    Asthma    Atrial fibrillation (HCC)    Bipolar 1 disorder (HCC)    Bulging lumbar disc    Chronic pain of left knee    COPD (chronic obstructive pulmonary disease) (HCC)    Coronary artery disease    CVA (cerebral vascular accident) (HCC)    Diabetes mellitus    Dyspnea    Dysrhythmia    GERD (gastroesophageal reflux disease)    Neuropathy    TIA (transient ischemic attack)    Vertigo    Past Surgical History:  Procedure Laterality Date   ABDOMINAL HYSTERECTOMY     CARPAL TUNNEL RELEASE Right    CHOLECYSTECTOMY     KNEE ARTHROSCOPY WITH LATERAL MENISECTOMY Left 06/08/2021   Procedure: KNEE ARTHROSCOPY WITH LATERAL MENISCECTOMY AND MEDIAL MENISCECTOMY;  Surgeon: Margrette Taft BRAVO, MD;  Location: [facility];  Service: Orthopedics;  Laterality: Left;   KNEE ARTHROSCOPY WITH MEDIAL MENISECTOMY Right 04/14/2016   Procedure: KNEE ARTHROSCOPY WITH MEDIAL AND LATERAL MENISECTOMY, MICROFRACTURE REPAIR;  Surgeon: Taft FORBES Minerva, MD;  Location: [facility];  Service: Orthopedics;  Laterality: Right;  lateral menisectomy - needs crutch training   TOTAL KNEE ARTHROPLASTY Left 06/20/2023   Procedure: LEFT TOTAL KNEE ARTHROPLASTY;  Surgeon: Vernetta Lonni GRADE, MD;  Location: [facility];  Service: Orthopedics;  Laterality: Left;   TOTAL KNEE ARTHROPLASTY Right 05/10/2024   Procedure: ARTHROPLASTY, KNEE, TOTAL;   Surgeon: Vernetta Lonni GRADE, MD;  Location: [facility];  Service: Orthopedics;  Laterality: Right;   Social History:  reports that she has been smoking cigarettes. She has never used smokeless tobacco. She reports that she does not drink alcohol and does not use drugs.  Allergies[1]  Family History  Problem Relation Age of Onset   Diabetes Mother    Heart attack Father     Prior to Admission medications  Medication Sig Start Date End Date Taking? Authorizing Provider  amiodarone  (PACERONE ) 200 MG tablet Take 0.5 tablets (100 mg total) by mouth daily. 06/04/24   Fenton, Clint R, PA  budesonide -formoterol  (SYMBICORT ) 160-4.5 MCG/ACT inhaler Inhale 2 puffs into the lungs 2 (two) times daily. 03/10/24   Celestia Rosaline SQUIBB, NP  buPROPion  (WELLBUTRIN  SR) 150 MG 12 hr tablet TAKE ONE TABLET TWICE DAILY 03/12/24   Celestia Rosaline SQUIBB, NP  ciprofloxacin  (CIPRO ) 500 MG tablet Take 500 mg by mouth 2 (two) times daily.    [provider]  divalproex  (DEPAKOTE  ER) 500 MG 24 hr tablet Take 500-1,000 mg by mouth See admin instructions. Take 500 mg by mouth in the morning and 1000 mg at bedtime Patient taking differently: Take 500 mg by mouth every morning. 05/19/21   [provider]  FEROSUL 325 (65 Fe) MG tablet TAKE ONE TABLET BY MOUTH DAILY AT 9AM WITH BREAKFAST 02/16/23   Celestia Rosaline SQUIBB, NP  insulin  aspart (NOVOLOG  FLEXPEN) 100 UNIT/ML FlexPen 0-9 Units, Subcutaneous, 3 times daily with meals CBG < 70: Implement Hypoglycemia measures CBG 70 - 120: 0 units CBG 121 - 150: 1 unit CBG 151 - 200: 2 units CBG 201 - 250: 3 units CBG 251 - 300: 5 units CBG 301 - 350: 7 units CBG 351 - 400: 9 units CBG > 400: call MD 05/22/24   Raenelle Donalda HERO, MD  meclizine  (ANTIVERT ) 25 MG tablet Take 25 mg by mouth 3 (three) times daily as needed for dizziness.    [provider]  metFORMIN  (GLUCOPHAGE ) 1000 MG tablet Take 1 tablet (1,000 mg total) by mouth 2 (two) times daily. TAKE ONE TABLET BY  MOUTH TWICE DAILY @ 9AM & 5PM WITH MEALS 08/09/23   Celestia Rosaline SQUIBB, NP  metoprolol  tartrate (LOPRESSOR ) 50 MG tablet Take 0.5 tablets (25 mg total) by mouth 2 (two) times daily. Pt must keep upcoming followup appt with Cardiology in January 2026 for any more refills. Thank You 06/19/24   Kate Lonni CROME, MD  ondansetron  (ZOFRAN ) 4 MG tablet Take 4 mg by mouth every 6 (six) hours as needed for nausea or vomiting.    [provider]  oxybutynin  (DITROPAN ) 5 MG tablet Take 1 tablet (5 mg total) by mouth 2-3 (two to three) times daily for bladder spasms. Patient taking differently: Take 5 mg by mouth 3 (three) times daily. 03/07/24   Celestia Rosaline SQUIBB, NP  oxyCODONE  (OXY IR/ROXICODONE ) 5 MG immediate release tablet Take 1-2 tablets (5-10 mg total) by mouth every 6 (six) hours as needed for moderate pain (pain score 4-6) (pain score 4-6). Patient taking differently: Take 5-10 mg by  mouth every 4 (four) hours as needed for moderate pain (pain score 4-6) (pain score 4-6). 05/22/24   Ghimire, Donalda HERO, MD  OZEMPIC , 0.25 OR 0.5 MG/DOSE, 2 MG/3ML SOPN INJECT 0.5MG  ONCE WEEKLY 05/30/24   Celestia Rosaline SQUIBB, NP  rivaroxaban  (XARELTO ) 20 MG TABS tablet TAKE ONE TABLET DAILY AT 5PM WITH SUPPER 06/19/24   Kate Lonni CROME, MD  rosuvastatin  (CRESTOR ) 10 MG tablet Take 1 tablet (10 mg total) by mouth daily. Pt must keep upcoming followup appt with Cardiology in January 2026 for any more refills. Thank You 06/19/24   Kate Lonni CROME, MD  tiZANidine  (ZANAFLEX ) 4 MG tablet Take 1 tablet (4 mg total) by mouth every 6 (six) hours as needed for muscle spasms. 05/13/24   Vernetta Lonni GRADE, MD    Physical Exam: Vitals:   07/13/24 0904 07/13/24 1147 07/13/24 1312 07/13/24 1313  BP: (!) 145/118 117/62 (!) 125/98   Pulse: 71 (!) 144 (!) 142   Resp: 14 14 (!) 22   Temp: 98.5 F (36.9 C) (!) 97.4 F (36.3 C)  (!) 97.4 F (36.3 C)  TempSrc: Rectal Oral  Oral  SpO2: 100% 100% 98%        Constitutional: Elderly female who appears to be some distress Eyes: PERRL, lids and conjunctivae normal ENMT: Mucous membranes are moist. Posterior pharynx clear of any exudate or lesions.  Neck: normal, supple  Respiratory: clear to auscultation bilaterally, no wheezing, no crackles. Normal respiratory effort. No accessory muscle use.  Cardiovascular: Irregularly irregular and tachycardic. No extremity edema. 2+ pedal pulses.   Abdomen: no tenderness, no masses palpated.   Bowel sounds positive.  Musculoskeletal: no clubbing / cyanosis. No joint deformity upper and lower extremities. Good ROM, no contractures. Normal muscle tone.  Skin: no rashes, lesions, ulcers. No induration Neurologic: CN 2-12 grossly intact.  Tremor present.  Strength 5/5 in all 4.  Psychiatric: Alert but unable to assess orientation readily at this time.  Data Reviewed:  EKG revealed atrial fibrillation with heart rates 141.  Reviewed labs, imaging, pertinent records as documented.  Assessment and Plan:  Pulmonary embolism DVT Present on admission.  Patient had been on Xarelto .  CT angiogram of the chest revealed a right lower lobe segmental pulmonary embolus with no associated right heart strain.  Doppler ultrasound of lower extremity revealed a left lower extremity DVT that appears to be acute.  Question failure of Xarelto . - Admit to progressive bed - Bedrest - Heparin  drip per pharmacy.  Will need to discuss alternatives for anticoagulation - Check echocardiogram  Question community-acquired pneumonia Acute.  Patient did report having intermittent cough.  CT angiogram of the chest noted right lower lobe peribronchial tree-in-bud nodularity concerning for infection/inflammation.  This could be related to - Admit to a progressive bed - Incentive spirometry and flutter valve - Add-on procalcitonin(0.2) - Continue Rocephin  and doxycycline  IV    Acute encephalopathy Patient noted to be altered, but  reportedly has confusion at baseline.  Not totally clear if she is at her baseline currently and currently appears to be alert and oriented to self.  CT scan of the head did not note any acute abnormality.  Ammonia level within normal limits at 21.  Question if related to infection. - Delirium precautions  - Continue to monitor and consider need of further workup if symptoms do not improve with current treatment  Atrial fibrillation with RVR on chronic anticoagulation Chronic.  Patient noted to be in atrial fibrillation with heart rate elevated  into the 140s. - Goal potassium least 4 magnesium  at least 2. - Amiodarone  drip - Transition back to oral medication regimen once able  Acute kidney injury Creatinine noted to be 1.35 with BUN 38.  Baseline creatinine previously noted to be around 0.8.  This is greater than 0.3 increased to suggest acute kidney injury.  Possibly secondary to dehydration given elevated BUN/creatinine ratio as well as elevated specific gravity.  Patient had been given IV fluid boluses. - Continue to monitor kidney function  Abnormal urinalysis Patient had Foley catheter in place, but had been treated for urinary tract infection earlier this week.  Urinalysis positive for large leukocytes, specific gravity greater than 1.03, many bacteria, greater than 50 RBC/hpf, greater than 50 squamous epithelial cells/hpf, and greater than 50 WBCs. - Orders placed to check urine culture - Antibiotics as noted above.  Adjust if deemed medically appropriate.  Elevated troponin Patient did report complaints of chest pain.  High-sensitivity troponin mildly elevated at 29.   - Trend cardiac troponin - Follow-up echocardiogram  Leukocytosis  Acute.  WBC elevated 10.7.  Thought possibly reactive to above. - Continue to monitor  Diastolic congestive heart failure Patient does not appear grossly fluid overloaded at this time.  Chest x-ray noted minimal left basilar segmental atelectasis  or scarring.  Last echocardiogram from 2024 noted EF to be 55 to 60% with grade 1 diastolic parameters when checked back in 08/2022. - Strict I&O's and daily weight  Diabetes mellitus type 2 On admission glucose noted to be 164.  Last available hemoglobin A1c 7.5 when checked 05/06/2024. - Hypoglycemic protocol - Hold metformin  - CBGs before every meal with sensitive SSI  COPD No acute exacerbation noted at this time. - Continue breathing treatments as needed  Dysphagia There was concern that patient could not safely swallow. - Speech therapy consulted to evaluate for swallowing  Mood disorder - Continue Depakote  and Wellbutrin   Obesity, class II BMI 37.29 kg/m   DVT prophylaxis: Heparin  Advance Care Planning:   Code Status: Full Code    Consults:    Family Communication: Unable to get in contact with family  Severity of Illness: The appropriate patient status for this patient is INPATIENT. Inpatient status is judged to be reasonable and necessary in order to provide the required intensity of service to ensure the patient's safety. The patient's presenting symptoms, physical exam findings, and initial radiographic and laboratory data in the context of their chronic comorbidities is felt to place them at high risk for further clinical deterioration. Furthermore, it is not anticipated that the patient will be medically stable for discharge from the hospital within 2 midnights of admission.   * I certify that at the point of admission it is my clinical judgment that the patient will require inpatient hospital care spanning beyond 2 midnights from the point of admission due to high intensity of service, high risk for further deterioration and high frequency of surveillance required.*  Author: Maximino DELENA Sharps, MD 07/13/2024 1:48 PM  For on call review [email or web address].      [1]  Allergies Allergen Reactions   Aspirin  Nausea Only and Other (See Comments)    Causes stomach pain    "

## 2024-07-13 NOTE — ED Notes (Signed)
 RN reached out to phlebotomy to get ammonia lab.

## 2024-07-14 ENCOUNTER — Inpatient Hospital Stay (HOSPITAL_COMMUNITY)

## 2024-07-14 DIAGNOSIS — R079 Chest pain, unspecified: Secondary | ICD-10-CM | POA: Diagnosis not present

## 2024-07-14 DIAGNOSIS — I2699 Other pulmonary embolism without acute cor pulmonale: Secondary | ICD-10-CM

## 2024-07-14 LAB — COMPREHENSIVE METABOLIC PANEL WITH GFR
ALT: 8 U/L (ref 0–44)
AST: 29 U/L (ref 15–41)
Albumin: 2.5 g/dL — ABNORMAL LOW (ref 3.5–5.0)
Alkaline Phosphatase: 53 U/L (ref 38–126)
Anion gap: 10 (ref 5–15)
BUN: 26 mg/dL — ABNORMAL HIGH (ref 8–23)
CO2: 21 mmol/L — ABNORMAL LOW (ref 22–32)
Calcium: 9 mg/dL (ref 8.9–10.3)
Chloride: 114 mmol/L — ABNORMAL HIGH (ref 98–111)
Creatinine, Ser: 1.03 mg/dL — ABNORMAL HIGH (ref 0.44–1.00)
GFR, Estimated: 59 mL/min — ABNORMAL LOW
Glucose, Bld: 152 mg/dL — ABNORMAL HIGH (ref 70–99)
Potassium: 4.9 mmol/L (ref 3.5–5.1)
Sodium: 145 mmol/L (ref 135–145)
Total Bilirubin: <0.2 mg/dL (ref 0.0–1.2)
Total Protein: 6.7 g/dL (ref 6.5–8.1)

## 2024-07-14 LAB — APTT: aPTT: 127 s — ABNORMAL HIGH (ref 24–36)

## 2024-07-14 LAB — CBC
HCT: 36.3 % (ref 36.0–46.0)
Hemoglobin: 11.2 g/dL — ABNORMAL LOW (ref 12.0–15.0)
MCH: 25.5 pg — ABNORMAL LOW (ref 26.0–34.0)
MCHC: 30.9 g/dL (ref 30.0–36.0)
MCV: 82.5 fL (ref 80.0–100.0)
Platelets: 211 K/uL (ref 150–400)
RBC: 4.4 MIL/uL (ref 3.87–5.11)
RDW: 19 % — ABNORMAL HIGH (ref 11.5–15.5)
WBC: 10.9 K/uL — ABNORMAL HIGH (ref 4.0–10.5)
nRBC: 0 % (ref 0.0–0.2)

## 2024-07-14 LAB — ECHOCARDIOGRAM COMPLETE
AR max vel: 3.82 cm2
AV Peak grad: 3.8 mmHg
Ao pk vel: 0.98 m/s
Calc EF: 45.3 %
Est EF: 55
Height: 72 in
S' Lateral: 3.1 cm
Single Plane A2C EF: 44.7 %
Single Plane A4C EF: 40.7 %
Weight: 4398.62 [oz_av]

## 2024-07-14 LAB — CBG MONITORING, ED
Glucose-Capillary: 153 mg/dL — ABNORMAL HIGH (ref 70–99)
Glucose-Capillary: 139 mg/dL — ABNORMAL HIGH (ref 70–99)

## 2024-07-14 LAB — GLUCOSE, CAPILLARY
Glucose-Capillary: 152 mg/dL — ABNORMAL HIGH (ref 70–99)
Glucose-Capillary: 122 mg/dL — ABNORMAL HIGH (ref 70–99)

## 2024-07-14 LAB — HEPARIN LEVEL (UNFRACTIONATED)
Heparin Unfractionated: 0.61 [IU]/mL (ref 0.30–0.70)
Heparin Unfractionated: 0.65 [IU]/mL (ref 0.30–0.70)
Heparin Unfractionated: 0.78 [IU]/mL — ABNORMAL HIGH (ref 0.30–0.70)
Heparin Unfractionated: 0.86 [IU]/mL — ABNORMAL HIGH (ref 0.30–0.70)

## 2024-07-14 LAB — TROPONIN T, HIGH SENSITIVITY: Troponin T High Sensitivity: 30 ng/L — ABNORMAL HIGH (ref 0–19)

## 2024-07-14 MED ORDER — PERFLUTREN LIPID MICROSPHERE
1.0000 mL | INTRAVENOUS | Status: AC | PRN
  Administered 2024-07-14: 3 mL via INTRAVENOUS

## 2024-07-14 MED ORDER — PNEUMOCOCCAL 20-VAL CONJ VACC 0.5 ML IM SUSY
0.5000 mL | PREFILLED_SYRINGE | INTRAMUSCULAR | Status: AC
  Administered 2024-07-18: 0.5 mL via INTRAMUSCULAR
  Filled 2024-07-14 (×2): qty 0.5

## 2024-07-14 MED ORDER — CHLORHEXIDINE GLUCONATE CLOTH 2 % EX PADS
6.0000 | MEDICATED_PAD | Freq: Every day | CUTANEOUS | Status: DC
  Administered 2024-07-14 – 2024-07-23 (×10): 6 via TOPICAL

## 2024-07-14 NOTE — Progress Notes (Addendum)
 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  She was found to have a small PE and acute DVT, was also found to be in A-fib with RVR.  She was placed on amiodarone  infusion, on heparin  infusion and admitted to the hospital.  Chest imaging was also concerning for pneumonia and she was placed on antibiotics with ceftriaxone  and doxycycline .  Subjective / 24h Interval events: She seems confused on my evaluation, mumbling, cannot really answer questions appropriately  Assesement and Plan: Principal problem Pulmonary embolism, acute DVT -patient has been on Xarelto , CT angiogram on admission showed a right lower lobe segmental pulmonary embolism without right heart strain.  Lower extremity ultrasound reveals a left lower extremity DVT that appears to be acute.  This might represent Xarelto  failure - For now keep on heparin , I spoke with Dr. Lonn, given recent hip surgery, immobility, SNF, DVTs can still happen would not necessarily be considered a full failure, however she does recommend switching to Eliquis  rather than Coumadin given higher Coumadin failure rates than NOACs.  Active problems Questionable community-acquired pneumonia-she did complain of a cough on admission, CT scan concerning for right lower lobe infection/inflammation.  Has been placed on antibiotics, continue for now, monitor fever curve, cultures  Acute metabolic encephalopathy-likely in the setting of #1 and #2.  Supportive care, treat underlying issues  Paroxysmal A-fib, with RVR-patient started on amiodarone  infusion, continue for now.  Continue heparin  for  anticoagulation  DM 2, with hyperglycemia-A1c 7.5.  Has been placed on sliding scale, continue  History of COPD-no wheezing, this is stable, she is on room air  Troponin elevation-flat, not in a pattern consistent with ACS, this is likely due to her PE and potential pneumonia  Acute kidney injury-in the setting of acute illness, most recent creatinine in November was 0.8, on admission was 1.3.  Continue to monitor.  I do suspect that she does have a degree of CKD 3A given intermittent variation in her creatinine over the last 4 - 5 years  Chronic diastolic CHF-last echo from 2024 shows LVEF 55 to 60%, grade 1 DD.  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, she would benefit from weight loss  Scheduled Meds:  buPROPion   150 mg Oral BID   divalproex   500 mg Oral q morning   insulin  aspart  0-5 Units Subcutaneous QHS   insulin  aspart  0-9 Units Subcutaneous TID WC   metoprolol  tartrate  25 mg Oral BID   sodium chloride  flush  3 mL Intravenous Q12H   Continuous Infusions:  amiodarone  30 mg/hr (07/14/24 0725)   cefTRIAXone  (ROCEPHIN )  IV     doxycycline  (VIBRAMYCIN ) IV Stopped (07/14/24 0739)   heparin  1,400 Units/hr (07/14/24 0902)   PRN Meds:.acetaminophen  **OR** acetaminophen , ondansetron  **OR** ondansetron  (ZOFRAN ) IV  Current Outpatient Medications  Medication Instructions   amiodarone  (PACERONE ) 100 mg, Oral, Daily   budesonide -formoterol  (SYMBICORT ) 160-4.5 MCG/ACT inhaler 2 puffs, Inhalation, 2 times daily   buPROPion  (WELLBUTRIN  SR) 150 mg, Oral, 2 times daily   cefTRIAXone  (ROCEPHIN ) 1 g, Daily   divalproex  (DEPAKOTE  ER) 500-1,000 mg, See admin instructions   FEROSUL 325 (65 Fe) MG tablet TAKE ONE  TABLET BY MOUTH DAILY AT 9AM WITH BREAKFAST   insulin  aspart (NOVOLOG  FLEXPEN) 100 UNIT/ML FlexPen 0-9 Units, Subcutaneous, 3 times daily with meals CBG < 70: Implement Hypoglycemia measures CBG 70 - 120: 0 units CBG 121 - 150: 1 unit CBG 151 - 200: 2 units CBG 201 -  250: 3 units CBG 251 - 300: 5 units CBG 301 - 350: 7 units CBG 351 - 400: 9 units CBG > 400: call MD   ipratropium-albuterol  (DUONEB) 0.5-2.5 (3) MG/3ML SOLN 3 mLs, Every 6 hours PRN   lidocaine  (XYLOCAINE ) 1 % (with preservative) injection    meclizine  (ANTIVERT ) 25 mg, 3 times daily PRN   metFORMIN  (GLUCOPHAGE ) 1,000 mg, Oral, 2 times daily, TAKE ONE TABLET BY MOUTH TWICE DAILY @ 9AM & 5PM WITH MEALS   metoprolol  tartrate (LOPRESSOR ) 25 mg, Oral, 2 times daily, Pt must keep upcoming followup appt with Cardiology in January 2026 for any more refills. Thank You   ondansetron  (ZOFRAN ) 4 mg, Every 6 hours PRN   oxybutynin  (DITROPAN ) 5 MG tablet Take 1 tablet (5 mg total) by mouth 2-3 (two to three) times daily for bladder spasms.   oxyCODONE  (OXY IR/ROXICODONE ) 5-10 mg, Oral, Every 6 hours PRN   OZEMPIC , 0.25 OR 0.5 MG/DOSE, 2 MG/3ML SOPN INJECT 0.5MG  ONCE WEEKLY   rivaroxaban  (XARELTO ) 20 MG TABS tablet TAKE ONE TABLET DAILY AT 5PM WITH SUPPER   rosuvastatin  (CRESTOR ) 10 mg, Oral, Daily, Pt must keep upcoming followup appt with Cardiology in January 2026 for any more refills. Thank You   sodium chloride  100 mL/hr, Intravenous, 2 times daily, Continuously for 3 days for AKI   tiZANidine  (ZANAFLEX ) 4 mg, Oral, Every 6 hours PRN   traZODone  (DESYREL ) 25 mg, Oral, Daily at bedtime    Diet Orders (From admission, onward)     Start     Ordered   07/13/24 1401  Diet heart healthy/carb modified Room service appropriate? Yes; Fluid consistency: Thin  Diet effective now       Question Answer Comment  Diet-HS Snack? Nothing   Room service appropriate? Yes   Fluid consistency: Thin      07/13/24 1411            DVT prophylaxis:    Lab Results  Component Value Date   PLT 211 07/14/2024      Code Status: Full Code  Family Communication: No family at bedside  Status is: Inpatient Remains inpatient appropriate because: Severity of illness   Level of care: Progressive  Consultants:   None  Objective: Vitals:   07/14/24 0400 07/14/24 0500 07/14/24 0747 07/14/24 0800  BP: 124/74 (!) 132/97  (!) 127/96  Pulse: (!) 116 (!) 116  (!) 117  Resp: 19 14  19   Temp: 98.9 F (37.2 C)  98.5 F (36.9 C)   TempSrc:      SpO2: 100% 100%  100%  Weight:      Height:        Intake/Output Summary (Last 24 hours) at 07/14/2024 1036 Last data filed at 07/14/2024 0739 Gross per 24 hour  Intake 586.94 ml  Output --  Net 586.94 ml   Wt Readings from Last 3 Encounters:  07/13/24 124.7 kg  06/04/24 108.9 kg  05/14/24 106 kg    Examination:  Constitutional: NAD, is confused Eyes: no scleral icterus ENMT: Mucous membranes are moist.  Neck: normal, supple Respiratory: Coarse breath sounds at the bases, faint rhonchi heard.  No crackles.  No wheezing.  Normal  respiratory effort Cardiovascular: Irregular irregular, no murmurs appreciated. No LE edema.  Abdomen: non distended, no tenderness. Bowel sounds positive.  Musculoskeletal: no clubbing / cyanosis.  Skin: no rashes Neurologic: non focal, follows commands   Data Reviewed: I have independently reviewed following labs and imaging studies  CBC Recent Labs  Lab 07/13/24 0834 07/14/24 0434  WBC 10.7* 10.9*  HGB 12.7 11.2*  HCT 41.4 36.3  PLT 274 211  MCV 84.3 82.5  MCH 25.9* 25.5*  MCHC 30.7 30.9  RDW 19.1* 19.0*  LYMPHSABS 2.9  --   MONOABS 0.7  --   EOSABS 0.1  --   BASOSABS 0.1  --     Recent Labs  Lab 07/13/24 0834 07/13/24 0845 07/13/24 0937 07/14/24 0434  NA 144  --   --  145  K 4.7  --   --  4.9  CL 111  --   --  114*  CO2 19*  --   --  21*  GLUCOSE 164*  --   --  152*  BUN 38*  --   --  26*  CREATININE 1.35*  --   --  1.03*  CALCIUM  9.8  --   --  9.0  AST 31  --   --  29  ALT 9  --   --  8  ALKPHOS 61  --   --  53  BILITOT 0.3  --   --  <0.2  ALBUMIN  3.1*  --   --  2.5*  MG 2.1  --   --   --   PROCALCITON 0.20  --   --   --   LATICACIDVEN  --  1.8  --   --   TSH 3.740  --   --   --    AMMONIA  --   --  21  --     ------------------------------------------------------------------------------------------------------------------ No results for input(s): CHOL, HDL, LDLCALC, TRIG, CHOLHDL, LDLDIRECT in the last 72 hours.  Lab Results  Component Value Date   HGBA1C 7.5 (H) 05/06/2024   ------------------------------------------------------------------------------------------------------------------ Recent Labs    07/13/24 0834  TSH 3.740    Cardiac Enzymes No results for input(s): CKMB, TROPONINI, MYOGLOBIN in the last 168 hours.  Invalid input(s): CK ------------------------------------------------------------------------------------------------------------------    Component Value Date/Time   BNP 184.0 (H) 05/22/2021 1453    CBG: Recent Labs  Lab 07/13/24 2006 07/14/24 0728  GLUCAP 151* 153*    Recent Results (from the past 240 hours)  Resp panel by RT-PCR (RSV, Flu A&B, Covid) Anterior Nasal Swab     Status: None   Collection Time: 07/13/24  8:34 AM   Specimen: Anterior Nasal Swab  Result Value Ref Range Status   SARS Coronavirus 2 by RT PCR NEGATIVE NEGATIVE Final   Influenza A by PCR NEGATIVE NEGATIVE Final   Influenza B by PCR NEGATIVE NEGATIVE Final    Comment: (NOTE) The Xpert Xpress SARS-CoV-2/FLU/RSV plus assay is intended as an aid in the diagnosis of influenza from Nasopharyngeal swab specimens and should not be used as a sole basis for treatment. Nasal washings and aspirates are unacceptable for Xpert Xpress SARS-CoV-2/FLU/RSV testing.  Fact Sheet for Patients: bloggercourse.com  Fact Sheet for Healthcare Providers: seriousbroker.it  This test is not yet approved or cleared by the [place]  FDA and has been authorized for detection and/or diagnosis of SARS-CoV-2 by FDA under an Emergency Use Authorization (EUA). This EUA will remain in effect (meaning this  test can be used) for the  duration of the COVID-19 declaration under Section 564(b)(1) of the Act, 21 U.S.C. section 360bbb-3(b)(1), unless the authorization is terminated or revoked.     Resp Syncytial Virus by PCR NEGATIVE NEGATIVE Final    Comment: (NOTE) Fact Sheet for Patients: bloggercourse.com  Fact Sheet for Healthcare Providers: seriousbroker.it  This test is not yet approved or cleared by the [place]  FDA and has been authorized for detection and/or diagnosis of SARS-CoV-2 by FDA under an Emergency Use Authorization (EUA). This EUA will remain in effect (meaning this test can be used) for the duration of the COVID-19 declaration under Section 564(b)(1) of the Act, 21 U.S.C. section 360bbb-3(b)(1), unless the authorization is terminated or revoked.  Performed at [facility], [address], [place], [place] [address]      Radiology Studies: VAS US  LOWER EXTREMITY VENOUS (DVT) (ONLY [facility] & [facility]) Result Date: 07/14/2024  Lower Venous DVT Study Patient Name:  Rhonda Hickman  Date of Exam:   07/13/2024 Medical Rec #: [id number]       Accession #:    [id number] Date of Birth: [birth year]       Patient Gender: F Patient Age:   49 years Exam Location:  [facility] Procedure:      VAS US  LOWER EXTREMITY VENOUS (DVT) Referring Phys: LONNI SAKAI --------------------------------------------------------------------------------  Indications: Tachycardia, and pulmonary embolism. Other Indications: Right total hip arthroplasty 5 weeks ago. Dementia. Currently                    resides in skilled nursing facility. Limitations: Body habitus and Patient condition. Comparison       No prior Right LEV on file. Prior negative left LEV done Study:           06/29/23 Performing Technologist: Alberta Lis RVS  Examination Guidelines: A complete evaluation includes B-mode imaging, spectral Doppler, color Doppler, and power Doppler as  needed of all accessible portions of each vessel. Bilateral testing is considered an integral part of a complete examination. Limited examinations for reoccurring indications may be performed as noted. The reflux portion of the exam is performed with the patient in reverse Trendelenburg.  +---------+---------------+---------+-----------+----------+-------------------+ RIGHT    CompressibilityPhasicitySpontaneityPropertiesThrombus Aging      +---------+---------------+---------+-----------+----------+-------------------+ CFV      Full           Yes      Yes                                      +---------+---------------+---------+-----------+----------+-------------------+ SFJ      Full                                                             +---------+---------------+---------+-----------+----------+-------------------+ FV Prox  Full           Yes      Yes                                      +---------+---------------+---------+-----------+----------+-------------------+ FV Mid   Full           Yes      Yes                                      +---------+---------------+---------+-----------+----------+-------------------+  FV DistalFull           Yes      Yes                                      +---------+---------------+---------+-----------+----------+-------------------+ PFV      Full                                                             +---------+---------------+---------+-----------+----------+-------------------+ POP                     Yes      Yes                  patent by color and                                                       Doppler             +---------+---------------+---------+-----------+----------+-------------------+ PTV      Full                                                             +---------+---------------+---------+-----------+----------+-------------------+ PERO                                                   Not well visualized +---------+---------------+---------+-----------+----------+-------------------+   +---------+---------------+---------+-----------+----------+-------------------+ LEFT     CompressibilityPhasicitySpontaneityPropertiesThrombus Aging      +---------+---------------+---------+-----------+----------+-------------------+ CFV      Partial        Yes      Yes                  Acute distal                                                              portion of the vein +---------+---------------+---------+-----------+----------+-------------------+ SFJ      Full                                                             +---------+---------------+---------+-----------+----------+-------------------+ FV Prox  Partial        Yes      Yes                  Acute               +---------+---------------+---------+-----------+----------+-------------------+  FV Mid   Full                                                             +---------+---------------+---------+-----------+----------+-------------------+ FV DistalFull           Yes      Yes                                      +---------+---------------+---------+-----------+----------+-------------------+ PFV      Partial                                      Acute               +---------+---------------+---------+-----------+----------+-------------------+ POP                     Yes      Yes                  patent by color and                                                       Doppler             +---------+---------------+---------+-----------+----------+-------------------+ PTV      Full                                                             +---------+---------------+---------+-----------+----------+-------------------+ PERO     Full                                                              +---------+---------------+---------+-----------+----------+-------------------+     Summary: RIGHT: - There is no evidence of deep vein thrombosis in the lower extremity.  LEFT: - Findings consistent with acute deep vein thrombosis involving the left common femoral vein, left femoral vein, and left proximal profunda vein.   *See table(s) above for measurements and observations. Electronically signed by Penne Colorado MD on 07/14/2024 at 10:24:15 AM.    Final    CT Angio Chest PE W and/or Wo Contrast Result Date: 07/13/2024 EXAM: CTA CHEST 07/13/2024 12:33:01 PM TECHNIQUE: CTA of the chest was performed without and with the administration of 75 mL of intravenous contrast (iohexol  (OMNIPAQUE ) 350 MG/ML injection 75 mL IOHEXOL  350 MG/ML SOLN). Multiplanar reformatted images are provided for review. MIP images are provided for review. Automated exposure control, iterative reconstruction, and/or weight based adjustment of the mA/kV was utilized to reduce the radiation dose to as low as reasonably achievable. COMPARISON: None available. CLINICAL HISTORY: Pulmonary embolism (PE) suspected, high  prob; DVT on US  with tachycardia. Rule out PE. FINDINGS: PULMONARY ARTERIES: Pulmonary arteries are adequately opacified for evaluation. Right lower lobe subsegmental pulmonary embolus. Main pulmonary artery is normal in caliber. MEDIASTINUM: Prominent left ventricle with question of left ventricular hypertrophy. No enlarged RV to LV ratio. The pericardium demonstrates no acute abnormality. There is no acute abnormality of the thoracic aorta. Atherosclerotic plaque in the aorta. Left anterior descending coronary artery calcification. LYMPH NODES: No mediastinal, hilar or axillary lymphadenopathy. LUNGS AND PLEURA: Left lower lobe soft tissue density versus mucous plugging within the left lower lobe bronchiole (4.71 cm). Left lower lobe atelectasis. Right lower lobe peribronchovascular tree-in-bud nodularity. No focal consolidation or  pulmonary edema. No evidence of pleural effusion or pneumothorax. UPPER ABDOMEN: Limited images of the upper abdomen are unremarkable. Status post cholecystectomy. SOFT TISSUES AND BONES: No acute bone or soft tissue abnormality. IMPRESSION: 1. Right lower lobe subsegmental pulmonary embolus. No associated right heart strain or pulmonary infarction. 2. Left lower lobe soft tissue density versus mucous plugging within a left lower lobe bronchiole. 3. Right lower lobe peribronchovascular tree-in-bud nodularity. Findings suggestive of infection/inflammation. Electronically signed by: Morgane Naveau MD 07/13/2024 12:43 PM EST RP Workstation: [id number]     Nilda Fendt, MD, PhD [facility]  Between 7 am - 7 pm I am available, please contact me via Amion (for emergencies) or Securechat (non urgent messages)  Between 7 pm - 7 am I am not available, please contact night coverage MD/APP via Amion  "

## 2024-07-14 NOTE — Progress Notes (Signed)
 Echocardiogram 2D Echocardiogram has been performed.  Alwin Lanigan N Raynold Blankenbaker,RDCS 07/14/2024, 2:10 PM

## 2024-07-14 NOTE — Progress Notes (Signed)
" ° °  Brief Progress Note   _____________________________________________________________________________________________________________  Patient Name: Rhonda Hickman Patient DOB: [birth year] Date: @TODAY @      Data: Reviewed labs, notes, VS, Amio drip.    Action: No action needed at this time.      Response:    _____________________________________________________________________________________________________________  [facility] RN Expeditor Sharolyn JONETTA Batman Please contact us  directly via secure chat (search for [facility]) or by calling us  at [phone] [facility]).  "

## 2024-07-14 NOTE — Progress Notes (Signed)
 ANTICOAGULATION CONSULT NOTE  Pharmacy Consult for Heparin  Indication: pulmonary embolus  Allergies[1]  Patient Measurements: Height: 6' (182.9 cm) Weight: 98.9 kg (218 lb 0.6 oz) IBW/kg (Calculated) : 73.1 Heparin  Dosing Weight: 101.4 kg  Vital Signs: Temp: 97.8 F (36.6 C) (01/04 1616) Temp Source: Axillary (01/04 1616) BP: 118/45 (01/04 1616) Pulse Rate: 106 (01/04 1616)  Labs: Recent Labs    07/13/24 0834 07/13/24 1500 07/13/24 2330 07/13/24 2333 07/14/24 0434 07/14/24 0800 07/14/24 1903  HGB 12.7  --   --   --  11.2*  --   --   HCT 41.4  --   --   --  36.3  --   --   PLT 274  --   --   --  211  --   --   APTT  --  58* 127*  --   --   --   --   HEPARINUNFRC  --  <0.10*  --    < > 0.61 0.78* 0.65  CREATININE 1.35*  --   --   --  1.03*  --   --    < > = values in this interval not displayed.    Estimated Creatinine Clearance: 69.8 mL/min (A) (by C-G formula based on SCr of 1.03 mg/dL (H)).   Medical History: Past Medical History:  Diagnosis Date   Anemia    Anxiety    Arthritis    Asthma    Atrial fibrillation (HCC)    Bipolar 1 disorder (HCC)    Bulging lumbar disc    Chronic pain of left knee    COPD (chronic obstructive pulmonary disease) (HCC)    Coronary artery disease    CVA (cerebral vascular accident) (HCC)    Diabetes mellitus    Dyspnea    Dysrhythmia    GERD (gastroesophageal reflux disease)    Neuropathy    TIA (transient ischemic attack)    Vertigo     Medications:  Medications Prior to Admission  Medication Sig Dispense Refill Last Dose/Taking   amiodarone  (PACERONE ) 200 MG tablet Take 0.5 tablets (100 mg total) by mouth daily.   07/12/2024   budesonide -formoterol  (SYMBICORT ) 160-4.5 MCG/ACT inhaler Inhale 2 puffs into the lungs 2 (two) times daily. 10.2 g 11 07/12/2024   buPROPion  (WELLBUTRIN  SR) 150 MG 12 hr tablet TAKE ONE TABLET TWICE DAILY 180 tablet 0 07/12/2024   divalproex  (DEPAKOTE  ER) 500 MG 24 hr tablet Take 500-1,000 mg by  mouth See admin instructions. Take 500 mg by mouth in the morning and 1000 mg at bedtime (Patient taking differently: Take 500-1,000 mg by mouth See admin instructions. Take 1 tablet by mouth in the morning and 2 tablets at bedtime)   07/12/2024   FEROSUL 325 (65 Fe) MG tablet TAKE ONE TABLET BY MOUTH DAILY AT 9AM WITH BREAKFAST 90 tablet 11 07/12/2024   insulin  aspart (NOVOLOG  FLEXPEN) 100 UNIT/ML FlexPen 0-9 Units, Subcutaneous, 3 times daily with meals CBG < 70: Implement Hypoglycemia measures CBG 70 - 120: 0 units CBG 121 - 150: 1 unit CBG 151 - 200: 2 units CBG 201 - 250: 3 units CBG 251 - 300: 5 units CBG 301 - 350: 7 units CBG 351 - 400: 9 units CBG > 400: call MD   07/12/2024   metFORMIN  (GLUCOPHAGE ) 1000 MG tablet Take 1 tablet (1,000 mg total) by mouth 2 (two) times daily. TAKE ONE TABLET BY MOUTH TWICE DAILY @ 9AM & 5PM WITH MEALS 180 tablet 1 07/12/2024  metoprolol  tartrate (LOPRESSOR ) 50 MG tablet Take 0.5 tablets (25 mg total) by mouth 2 (two) times daily. Pt must keep upcoming followup appt with Cardiology in January 2026 for any more refills. Thank You 90 tablet 0 07/12/2024   oxybutynin  (DITROPAN ) 5 MG tablet Take 1 tablet (5 mg total) by mouth 2-3 (two to three) times daily for bladder spasms. (Patient taking differently: Take 5 mg by mouth 3 (three) times daily.) 180 tablet 1 07/12/2024   rivaroxaban  (XARELTO ) 20 MG TABS tablet TAKE ONE TABLET DAILY AT 5PM WITH SUPPER 90 tablet 1 07/12/2024   rosuvastatin  (CRESTOR ) 10 MG tablet Take 1 tablet (10 mg total) by mouth daily. Pt must keep upcoming followup appt with Cardiology in January 2026 for any more refills. Thank You 90 tablet 0 07/12/2024   sodium chloride  0.9 % infusion Inject 100 mL/hr into the vein in the morning and at bedtime. Continuously for 3 days for AKI   07/12/2024   traZODone  (DESYREL ) 50 MG tablet Take 25 mg by mouth at bedtime.   07/12/2024   cefTRIAXone  (ROCEPHIN ) 1 g injection Inject 1 g into the muscle daily. (Patient not taking:  Reported on 07/14/2024)   Not Taking   ipratropium-albuterol  (DUONEB) 0.5-2.5 (3) MG/3ML SOLN Take 3 mLs by nebulization every 6 (six) hours as needed. (Patient not taking: Reported on 07/14/2024)   Not Taking   lidocaine  (XYLOCAINE ) 1 % (with preservative) injection  (Patient not taking: Reported on 07/14/2024)   Not Taking   meclizine  (ANTIVERT ) 25 MG tablet Take 25 mg by mouth 3 (three) times daily as needed for dizziness. (Patient not taking: Reported on 07/14/2024)   Not Taking   ondansetron  (ZOFRAN ) 4 MG tablet Take 4 mg by mouth every 6 (six) hours as needed for nausea or vomiting. (Patient not taking: Reported on 07/14/2024)   Not Taking   oxyCODONE  (OXY IR/ROXICODONE ) 5 MG immediate release tablet Take 1-2 tablets (5-10 mg total) by mouth every 6 (six) hours as needed for moderate pain (pain score 4-6) (pain score 4-6). (Patient not taking: Reported on 07/14/2024) 20 tablet 0 Not Taking   OZEMPIC , 0.25 OR 0.5 MG/DOSE, 2 MG/3ML SOPN INJECT 0.5MG  ONCE WEEKLY (Patient not taking: Reported on 07/14/2024) 3 mL 1 Not Taking   tiZANidine  (ZANAFLEX ) 4 MG tablet Take 1 tablet (4 mg total) by mouth every 6 (six) hours as needed for muscle spasms. (Patient not taking: Reported on 07/14/2024) 30 tablet 0 Not Taking   Scheduled:   buPROPion   150 mg Oral BID   Chlorhexidine  Gluconate Cloth  6 each Topical Daily   divalproex   500 mg Oral q morning   insulin  aspart  0-5 Units Subcutaneous QHS   insulin  aspart  0-9 Units Subcutaneous TID WC   metoprolol  tartrate  25 mg Oral BID   [START ON 07/15/2024] pneumococcal 20-valent conjugate vaccine  0.5 mL Intramuscular Tomorrow-1000   sodium chloride  flush  3 mL Intravenous Q12H   Infusions:   amiodarone  30 mg/hr (07/14/24 0725)   cefTRIAXone  (ROCEPHIN )  IV 2 g (07/14/24 1311)   doxycycline  (VIBRAMYCIN ) IV 100 mg (07/14/24 1824)   heparin  1,400 Units/hr (07/14/24 0902)   PRN: acetaminophen  **OR** acetaminophen , ondansetron  **OR** ondansetron  (ZOFRAN ) IV  Assessment: 55  yof with a history of HF, AF on Xarelto , bipolar, GERD, knee surgery 2 months ago. Heparin  per pharmacy consult placed for pulmonary embolus. CT w/ PE no RHS. US  w/ Left DVT.  Patient is on xarelto  prior to arrival. Last dose more than a  week ago per patient though she is reported to be altered. Initial heparin  level < 0.10.   Hgb 11.2; plt 211 Heparin  level slightly elevated despite dose decrease at 0.78.   PM update: heparin  level 0.65 is therapeutic on 1400 units/hr. No issues with the infusion or bleeding reported.  Goal of Therapy:  Heparin  level 0.3-0.7 units/ml aPTT 66-102 seconds Monitor platelets by anticoagulation protocol: Yes   Plan:  Continue heparin  infusion at 1400 units/hr Check confirmatory anti-Xa level in 8 hours and daily while on heparin  Continue to monitor H&H and platelets  Rocky Slade, PharmD, BCPS Clinical Pharmacist  Please check AMION for all [facility] phone numbers After 10:00 PM, call Main Pharmacy [phone]      [1]  Allergies Allergen Reactions   Aspirin  Nausea Only and Other (See Comments)    Causes stomach pain

## 2024-07-14 NOTE — ED Notes (Signed)
 Patient had a bowel movement. Patient is clean and linen changed.

## 2024-07-14 NOTE — Progress Notes (Signed)
 PHARMACY - ANTICOAGULATION CONSULT NOTE  Pharmacy Consult for heparin  Indication: PE/DVT and Afib  Labs: Recent Labs    07/13/24 0834 07/13/24 1500 07/13/24 2330 07/13/24 2333  HGB 12.7  --   --   --   HCT 41.4  --   --   --   PLT 274  --   --   --   APTT  --  58* 127*  --   HEPARINUNFRC  --  <0.10*  --  0.86*  CREATININE 1.35*  --   --   --    Assessment: 69yo female supratherapeutic on heparin  with initial dosing for new PE/DVT, on DOAC PTA for Afib but last dose possibly 1wk ago or longer, anti-Xa assay no longer affected; no infusion issues or signs of bleeding per RN.  Goal of Therapy:  Heparin  level 0.3-0.7 units/ml   Plan:  Decrease heparin  infusion by 1-2 units/kg/hr to 1500 units/hr. Check level in 6 hours.   Marvetta Dauphin, PharmD, BCPS 07/14/2024 1:44 AM

## 2024-07-14 NOTE — Progress Notes (Signed)
 ANTICOAGULATION CONSULT NOTE  Pharmacy Consult for Heparin  Indication: pulmonary embolus  Allergies[1]  Patient Measurements: Height: 6' (182.9 cm) Weight: 124.7 kg (274 lb 14.6 oz) IBW/kg (Calculated) : 73.1 Heparin  Dosing Weight: 101.4 kg  Vital Signs: Temp: 98.5 F (36.9 C) (01/04 0747) Temp Source: Oral (01/04 0219) BP: 127/96 (01/04 0800) Pulse Rate: 117 (01/04 0800)  Labs: Recent Labs    07/13/24 0834 07/13/24 1500 07/13/24 1500 07/13/24 2330 07/13/24 2333 07/14/24 0434 07/14/24 0800  HGB 12.7  --   --   --   --  11.2*  --   HCT 41.4  --   --   --   --  36.3  --   PLT 274  --   --   --   --  211  --   APTT  --  58*  --  127*  --   --   --   HEPARINUNFRC  --  <0.10*   < >  --  0.86* 0.61 0.78*  CREATININE 1.35*  --   --   --   --  1.03*  --    < > = values in this interval not displayed.    Estimated Creatinine Clearance: 78.4 mL/min (A) (by C-G formula based on SCr of 1.03 mg/dL (H)).   Medical History: Past Medical History:  Diagnosis Date   Anemia    Anxiety    Arthritis    Asthma    Atrial fibrillation (HCC)    Bipolar 1 disorder (HCC)    Bulging lumbar disc    Chronic pain of left knee    COPD (chronic obstructive pulmonary disease) (HCC)    Coronary artery disease    CVA (cerebral vascular accident) (HCC)    Diabetes mellitus    Dyspnea    Dysrhythmia    GERD (gastroesophageal reflux disease)    Neuropathy    TIA (transient ischemic attack)    Vertigo     Medications:  (Not in a hospital admission)  Scheduled:   buPROPion   150 mg Oral BID   divalproex   500 mg Oral q morning   insulin  aspart  0-5 Units Subcutaneous QHS   insulin  aspart  0-9 Units Subcutaneous TID WC   metoprolol  tartrate  25 mg Oral BID   sodium chloride  flush  3 mL Intravenous Q12H   Infusions:   amiodarone  30 mg/hr (07/14/24 0725)   cefTRIAXone  (ROCEPHIN )  IV     doxycycline  (VIBRAMYCIN ) IV Stopped (07/14/24 0739)   heparin  1,500 Units/hr (07/14/24 0404)    lactated ringers  Stopped (07/13/24 1946)   PRN: acetaminophen  **OR** acetaminophen , ondansetron  **OR** ondansetron  (ZOFRAN ) IV  Assessment: 27 yof with a history of HF, AF on Xarelto , bipolar, GERD, knee surgery 2 months ago. Heparin  per pharmacy consult placed for pulmonary embolus. CT w/ PE no RHS. US  w/ Left DVT.  Patient is on xarelto  prior to arrival. Last dose more than a week ago per patient though she is reported to be altered. Initial heparin  level < 0.10.   Hgb 11.2; plt 211 Heparin  level slightly elevated despite dose decrease at 0.78.   Goal of Therapy:  Heparin  level 0.3-0.7 units/ml aPTT 66-102 seconds Monitor platelets by anticoagulation protocol: Yes   Plan:  Decrease heparin  infusion to 1400 units/hr Check anti-Xa level in 8 hours and daily while on heparin  Continue to monitor H&H and platelets  Rankin Sams, PharmD, BCCCP Clinical Pharmacist    [1]  Allergies Allergen Reactions   Aspirin  Nausea Only and Other (  See Comments)    Causes stomach pain

## 2024-07-14 NOTE — Evaluation (Signed)
 "  Clinical/Bedside Swallow Evaluation Patient Details  Name: Rhonda Hickman MRN: [id number] Date of Birth: 1955-09-13  Today's Date: 07/14/2024 Time: SLP Start Time (ACUTE ONLY): 1210 SLP Stop Time (ACUTE ONLY): 1226 SLP Time Calculation (min) (ACUTE ONLY): 16 min  Past Medical History:  Past Medical History:  Diagnosis Date   Anemia    Anxiety    Arthritis    Asthma    Atrial fibrillation (HCC)    Bipolar 1 disorder (HCC)    Bulging lumbar disc    Chronic pain of left knee    COPD (chronic obstructive pulmonary disease) (HCC)    Coronary artery disease    CVA (cerebral vascular accident) (HCC)    Diabetes mellitus    Dyspnea    Dysrhythmia    GERD (gastroesophageal reflux disease)    Neuropathy    TIA (transient ischemic attack)    Vertigo    Past Surgical History:  Past Surgical History:  Procedure Laterality Date   ABDOMINAL HYSTERECTOMY     CARPAL TUNNEL RELEASE Right    CHOLECYSTECTOMY     KNEE ARTHROSCOPY WITH LATERAL MENISECTOMY Left 06/08/2021   Procedure: KNEE ARTHROSCOPY WITH LATERAL MENISCECTOMY AND MEDIAL MENISCECTOMY;  Surgeon: Margrette Taft BRAVO, MD;  Location: [facility];  Service: Orthopedics;  Laterality: Left;   KNEE ARTHROSCOPY WITH MEDIAL MENISECTOMY Right 04/14/2016   Procedure: KNEE ARTHROSCOPY WITH MEDIAL AND LATERAL MENISECTOMY, MICROFRACTURE REPAIR;  Surgeon: Taft BRAVO Margrette, MD;  Location: [facility];  Service: Orthopedics;  Laterality: Right;  lateral menisectomy - needs crutch training   TOTAL KNEE ARTHROPLASTY Left 06/20/2023   Procedure: LEFT TOTAL KNEE ARTHROPLASTY;  Surgeon: Vernetta Lonni GRADE, MD;  Location: [facility];  Service: Orthopedics;  Laterality: Left;   TOTAL KNEE ARTHROPLASTY Right 05/10/2024   Procedure: ARTHROPLASTY, KNEE, TOTAL;  Surgeon: Vernetta Lonni GRADE, MD;  Location: [facility];  Service: Orthopedics;  Laterality: Right;   HPI:  Pt is a 69 y.o. female  who presented 1/3 with complaints of chest pain. CT angiogram of the  chest: right lower lobe segmental pulmonary embolus, right lower lobe peribronchial tree-in-bud nodularity concerning for infection/inflammation. SLP consulted due to concern that patient could not safely swallow. PMH: diastolic congestive heart failure, atrial fibrillation on Xarelto , bipolar disorder, GERD, and status post right knee replacement on 05/10/2024    Assessment / Plan / Recommendation  Clinical Impression  Pt was seen for bedside swallow evaluation with her niece present and the pt's sister was contacted via phone. Pt's family reported that for the past three weeks the pt has been grimacing when eating/drinking. Family suggested that this may be due to her not wanting to eat or her throat being dry. Per pt's sister, Rhonda Hickman, the pt typically consumes a minced and moist consistency, but [facility] reported that the have a dysphagia advanced diet on record. Oral mechanism exam was limited due to pt's difficulty following some commands; however, oral motor strength and ROM appeared grossly [facility] and pt was edentulous. She tolerated all solids and liquids without signs or symptoms of aspiration. Mastication and oral clearance was WNL for dysphagia 3 solids and pt reduced regular texture solids. Per family's request, a dysphagia 2 diet will be ordered. SLP will see pt once more to ensure tolerance. SLP Visit Diagnosis: Dysphagia, unspecified (R13.10)    Aspiration Risk  Mild aspiration risk    Diet Recommendation           Other Recommendations Oral Care Recommendations: Oral care BID  Swallow Evaluation Recommendations Recommendations: PO diet PO Diet Recommendation: Dysphagia 2 (Finely chopped);Thin liquids (Level 0) Liquid Administration via: Cup;Straw Medication Administration: Whole meds with puree (or crushed; as tolerated) Supervision: Staff to assist with self-feeding Postural changes: Position pt fully upright for meals;Stay upright 30-60 min after meals Oral  care recommendations: Oral care BID (2x/day)   Assistance Recommended at Discharge    Functional Status Assessment Patient has not had a recent decline in their functional status  Frequency and Duration min 1 x/week  1 week       Prognosis Prognosis for improved oropharyngeal function: Good      Swallow Study   General Date of Onset: 07/13/24 HPI: Pt is a 69 y.o. female  who presented 1/3 with complaints of chest pain. CT angiogram of the chest: right lower lobe segmental pulmonary embolus, right lower lobe peribronchial tree-in-bud nodularity concerning for infection/inflammation. SLP consulted due to concern that patient could not safely swallow. PMH: diastolic congestive heart failure, atrial fibrillation on Xarelto , bipolar disorder, GERD, and status post right knee replacement on 05/10/2024 Type of Study: Bedside Swallow Evaluation Previous Swallow Assessment: none Diet Prior to this Study: Regular;Thin liquids (Level 0) Temperature Spikes Noted: No Respiratory Status: Room air History of Recent Intubation: No Behavior/Cognition: Alert;Cooperative;Pleasant mood Oral Cavity Assessment: Within Functional Limits Oral Care Completed by SLP: No Oral Cavity - Dentition: Edentulous Vision: Functional for self-feeding Self-Feeding Abilities: Total assist Patient Positioning: Upright in bed;Postural control adequate for testing Baseline Vocal Quality: Normal Volitional Cough: Cognitively unable to elicit Volitional Swallow: Able to elicit    Oral/Motor/Sensory Function Overall Oral Motor/Sensory Function: Within functional limits (though limited)   Ice Chips Ice chips: Within functional limits Presentation: Spoon   Thin Liquid Thin Liquid: Within functional limits Presentation: Straw    Nectar Thick Nectar Thick Liquid: Not tested   Honey Thick Honey Thick Liquid: Not tested   Puree Puree: Within functional limits Presentation: Spoon   Solid     Solid: Within functional  limits Presentation: Rhonda Rhonda I. [place], MS, CCC-SLP Acute Rehabilitation Services Office number [phone]  Rhonda Hickman 07/14/2024,12:51 PM      "

## 2024-07-14 NOTE — Progress Notes (Signed)
 There was a consult for placing 3rd PIV access. Heparin  is compatible with Rocephin , Amiodarone  is compatible with doxycycline . No need to put in the 3rd PIV access. Informed patient's nurse regarding this matter. HS [employer]

## 2024-07-15 ENCOUNTER — Other Ambulatory Visit (HOSPITAL_COMMUNITY): Payer: Self-pay

## 2024-07-15 ENCOUNTER — Telehealth (HOSPITAL_COMMUNITY): Payer: Self-pay

## 2024-07-15 DIAGNOSIS — I2699 Other pulmonary embolism without acute cor pulmonale: Secondary | ICD-10-CM | POA: Diagnosis not present

## 2024-07-15 LAB — GLUCOSE, CAPILLARY
Glucose-Capillary: 157 mg/dL — ABNORMAL HIGH (ref 70–99)
Glucose-Capillary: 153 mg/dL — ABNORMAL HIGH (ref 70–99)
Glucose-Capillary: 169 mg/dL — ABNORMAL HIGH (ref 70–99)
Glucose-Capillary: 188 mg/dL — ABNORMAL HIGH (ref 70–99)

## 2024-07-15 LAB — CBC
HCT: 31.7 % — ABNORMAL LOW (ref 36.0–46.0)
Hemoglobin: 10 g/dL — ABNORMAL LOW (ref 12.0–15.0)
MCH: 25.3 pg — ABNORMAL LOW (ref 26.0–34.0)
MCHC: 31.5 g/dL (ref 30.0–36.0)
MCV: 80.1 fL (ref 80.0–100.0)
Platelets: 214 K/uL (ref 150–400)
RBC: 3.96 MIL/uL (ref 3.87–5.11)
RDW: 18.6 % — ABNORMAL HIGH (ref 11.5–15.5)
WBC: 9.6 K/uL (ref 4.0–10.5)
nRBC: 0 % (ref 0.0–0.2)

## 2024-07-15 LAB — COMPREHENSIVE METABOLIC PANEL WITH GFR
ALT: 9 U/L (ref 0–44)
AST: 26 U/L (ref 15–41)
Albumin: 2.5 g/dL — ABNORMAL LOW (ref 3.5–5.0)
Alkaline Phosphatase: 52 U/L (ref 38–126)
Anion gap: 11 (ref 5–15)
BUN: 18 mg/dL (ref 8–23)
CO2: 20 mmol/L — ABNORMAL LOW (ref 22–32)
Calcium: 8.9 mg/dL (ref 8.9–10.3)
Chloride: 108 mmol/L (ref 98–111)
Creatinine, Ser: 0.91 mg/dL (ref 0.44–1.00)
GFR, Estimated: >60 mL/min
Glucose, Bld: 152 mg/dL — ABNORMAL HIGH (ref 70–99)
Potassium: 3.7 mmol/L (ref 3.5–5.1)
Sodium: 139 mmol/L (ref 135–145)
Total Bilirubin: 0.2 mg/dL (ref 0.0–1.2)
Total Protein: 6.6 g/dL (ref 6.5–8.1)

## 2024-07-15 LAB — MAGNESIUM: Magnesium: 1.4 mg/dL — ABNORMAL LOW (ref 1.7–2.4)

## 2024-07-15 LAB — HEPARIN LEVEL (UNFRACTIONATED): Heparin Unfractionated: 0.71 [IU]/mL — ABNORMAL HIGH (ref 0.30–0.70)

## 2024-07-15 MED ORDER — APIXABAN 5 MG PO TABS
10.0000 mg | ORAL_TABLET | Freq: Two times a day (BID) | ORAL | Status: AC
  Administered 2024-07-15 – 2024-07-22 (×13): 10 mg via ORAL
  Filled 2024-07-15 (×14): qty 2

## 2024-07-15 MED ORDER — APIXABAN 5 MG PO TABS
5.0000 mg | ORAL_TABLET | Freq: Two times a day (BID) | ORAL | Status: DC
  Administered 2024-07-22 – 2024-07-23 (×2): 5 mg via ORAL
  Filled 2024-07-15 (×2): qty 1

## 2024-07-15 MED ORDER — METOPROLOL TARTRATE 25 MG PO TABS
37.5000 mg | ORAL_TABLET | Freq: Two times a day (BID) | ORAL | Status: DC
  Administered 2024-07-15 – 2024-07-23 (×13): 37.5 mg via ORAL
  Filled 2024-07-15 (×15): qty 1

## 2024-07-15 MED ORDER — MAGNESIUM SULFATE 2 GM/50ML IV SOLN
2.0000 g | Freq: Once | INTRAVENOUS | Status: AC
  Administered 2024-07-15: 2 g via INTRAVENOUS
  Filled 2024-07-15: qty 50

## 2024-07-15 NOTE — Telephone Encounter (Signed)
 Pharmacy Patient Advocate Encounter  Insurance verification completed.    The patient is insured through [facility]. Patient has Medicare and is not eligible for a copay card, but may be able to apply for patient assistance or Medicare RX Payment Plan (Patient Must reach out to their plan, if eligible for payment plan), if available.    Ran test claim for Eliquis  5mg  tablet and the current 30 day co-pay is $4.90.   This test claim was processed through [facility]- copay amounts may vary at other pharmacies due to pharmacy/plan contracts, or as the patient moves through the different stages of their insurance plan.

## 2024-07-15 NOTE — TOC Initial Note (Signed)
 Transition of Care [facility]) - Initial/Assessment Note    Patient Details  Name: Rhonda Hickman MRN: [id number] Date of Birth: [birth year]  Transition of Care [facility]) CM/SW Contact:    Luise JAYSON Pan, LCSWA Phone Number: 07/15/2024, 2:01 PM  Clinical Narrative:   PTA patient was at [facility] for STR. PT/OT to eval.  Patient is from home. Patient has a PCP and insurance, and uses home delivery for medications. Patient has family support.   TOC will continue to follow.  Expected Discharge Plan:  (TBD) Barriers to Discharge: Continued Medical Work up   Patient Goals and CMS Choice Patient states their goals for this hospitalization and ongoing recovery are:: Unable to assess          Expected Discharge Plan and Services In-house Referral: Clinical Social Work, Radio Producer Services: CM Consult   Living arrangements for the past 2 months: Apartment                                      Prior Living Arrangements/Services Living arrangements for the past 2 months: Apartment Lives with:: Self Patient language and need for interpreter reviewed:: Yes Do you feel safe going back to the place where you live?: Yes      Need for Family Participation in Patient Care: Yes (Comment) Care giver support system in place?: Yes (comment)   Criminal Activity/Legal Involvement Pertinent to Current Situation/Hospitalization: No - Comment as needed  Activities of Daily Living   ADL Screening (condition at time of admission) Independently performs ADLs?: No Does the patient have a NEW difficulty with bathing/dressing/toileting/self-feeding that is expected to last >3 days?: No Does the patient have a NEW difficulty with getting in/out of bed, walking, or climbing stairs that is expected to last >3 days?: No Does the patient have a NEW difficulty with communication that is expected to last >3 days?: No Is the patient deaf or have difficulty hearing?: Yes Does the  patient have difficulty seeing, even when wearing glasses/contacts?: Yes Does the patient have difficulty concentrating, remembering, or making decisions?: Yes  Permission Sought/Granted Permission sought to share information with : Family Supports Permission granted to share information with : No (Family contact info in chart)  Share Information with NAME: Brien Males  Sister, Emergency Contact  [phone]        Permission granted to share info w Contact Information: Kirkland,Betty  (sis) [phone]  Emotional Assessment Appearance:: Appears stated age Attitude/Demeanor/Rapport: Unable to Assess Affect (typically observed): Unable to Assess Orientation: : Oriented to Self, Oriented to Place Alcohol / Substance Use: Not Applicable Psych Involvement: No (comment)  Admission diagnosis:  Community acquired pneumonia [J18.9] Atrial fibrillation with RVR (HCC) [I48.91] Tobacco use [Z72.0] Urinary tract infection without hematuria, site unspecified [N39.0] Deep vein thrombosis (DVT) of proximal lower extremity, unspecified chronicity, unspecified laterality (HCC) [I82.4Y9] Pneumonia due to infectious organism, unspecified laterality, unspecified part of lung [J18.9] Acute pulmonary embolism without acute cor pulmonale, unspecified pulmonary embolism type (HCC) [I26.99] Patient Active Problem List   Diagnosis Date Noted   Community acquired pneumonia 07/13/2024   Acute pulmonary embolism (HCC) 07/13/2024   Pressure injury of skin 07/13/2024   DVT (deep venous thrombosis) (HCC) 07/13/2024   Abnormal urinalysis 07/13/2024   Elevated troponin 07/13/2024   Chronic diastolic CHF (congestive heart failure) (HCC) 07/13/2024   Mood disorder 07/13/2024   Obesity (BMI 30-39.9) 07/13/2024   Dysphagia 07/13/2024   Acute  encephalopathy 07/13/2024   Long term current use of antiarrhythmic drug 05/21/2024   Long term (current) use of anticoagulants 05/20/2024   Agatston coronary artery  calcium  score less than 100 05/20/2024   Anemia 05/20/2024   Atrial fibrillation/flutter (HCC) 05/20/2024   Atrial flutter (HCC) 05/16/2024   Small bowel obstruction (HCC) 05/16/2024   Status post right knee replacement 05/14/2024   AMS (altered mental status) 05/14/2024   Status post total right knee replacement 05/10/2024   OA (osteoarthritis) of knee 06/20/2023   Status post total left knee replacement 06/20/2023   Lumbar radiculopathy 02/01/2023   S/P left knee arthroscopy11/29/22 07/13/2021   Tear of meniscus of knee joint    Acute lower UTI 05/23/2021   A-fib (HCC) 05/22/2021   Leukocytosis 05/22/2021   Fall at home, initial encounter 05/22/2021    Class: Acute   HLD (hyperlipidemia) 05/22/2021   Hypertension 03/18/2020   Bipolar 1 disorder (HCC)    AKI (acute kidney injury) 12/18/2019   Chronic atrial fibrillation (HCC) 12/03/2019   TIA (transient ischemic attack) 12/03/2019   Diabetic peripheral neuropathy (HCC) 12/03/2019   Chronic migraine 12/03/2019   Vertigo 08/03/2019   Bronchiectasis with acute exacerbation (HCC)    Obesity, Class III, BMI 40-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) (HCC)    Gastroesophageal reflux disease    Asthma exacerbation 06/06/2018   Gait abnormality 08/29/2017   Paresthesia 08/29/2017   Derangement of posterior horn of medial meniscus of right knee    Meniscus, lateral, derangement, right    Unilateral primary osteoarthritis, right knee    COPD (chronic obstructive pulmonary disease) (HCC) 03/09/2016   Type 2 diabetes mellitus (HCC) 02/16/2007   Cocaine abuse (HCC) 02/16/2007   Extrinsic asthma 02/16/2007   HOMELESSNESS, HX OF 02/16/2007   PCP:  Cicero Aureliano SAUNDERS, MD Pharmacy:   [facility] - [place], [place] - [address] [place] [address] Phone: [phone] Fax: [phone]  [facility] - [place], [place] - [address] [place] [address] Phone:  [phone] Fax: [phone]     Social Drivers of Health (SDOH) Social History: SDOH Screenings   Food Insecurity: No Food Insecurity (07/14/2024)  Housing: Low Risk (07/14/2024)  Transportation Needs: No Transportation Needs (07/14/2024)  Utilities: Not At Risk (07/14/2024)  Depression (PHQ2-9): Low Risk (03/29/2024)  Social Connections: Moderately Isolated (07/14/2024)  Tobacco Use: High Risk (07/13/2024)   SDOH Interventions:     Readmission Risk Interventions     No data to display

## 2024-07-15 NOTE — Evaluation (Signed)
 Physical Therapy Evaluation Patient Details Name: Rhonda Hickman MRN: [id number] DOB: 22-Mar-1956 Today's Date: 07/15/2024  History of Present Illness  69 y.o. F adm 1/3 with chest pain and confusion.  Found to have L DVT and PE on workup. 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  Clinical Impression  Patient presents with decreased mobility due to decreased cognition, generalized weakness, decreased sitting balance, decreased activity tolerance and R knee pain.  She was previously able to mobilize at rehab with lift assist though in the recent past had been working on walking per family.  Today needing +2 A for mobility at EOB and working on sitting balance, midline awareness and core strength.  Feel she will benefit from continued skilled PT in the acute setting and from post-acute inpatient rehab (<3 hours/day) at d/c.         If plan is discharge home, recommend the following: Two people to help with bathing/dressing/bathroom;Two people to help with walking and/or transfers;Supervision due to cognitive status;Help with stairs or ramp for entrance;Assistance with feeding   Can travel by private vehicle   No    Equipment Recommendations None recommended by PT  Recommendations for Other Services       Functional Status Assessment Patient has had a recent decline in their functional status and/or demonstrates limited ability to make significant improvements in function in a reasonable and predictable amount of time     Precautions / Restrictions Precautions Precautions: Fall Precaution/Restrictions Comments: watch HR      Mobility  Bed Mobility Overal bed mobility: Needs Assistance Bed Mobility: Supine to Sit, Sit to Supine     Supine to sit: HOB elevated, Used rails, Max assist, +2 for physical assistance Sit to supine: Total  assist, +2 for physical assistance   General bed mobility comments: sister and daughter present and assisting, helped legs to EOB, then assist with HHA and lifting trunk upright, scooting hips to EOB; to supine assist for both legs and trunk and to scoot to [facility] in [place] with +2    Transfers                   General transfer comment: not safe to attempt    Ambulation/Gait                  Stairs            Wheelchair Mobility     Tilt Bed    Modified Rankin (Stroke Patients Only)       Balance Overall balance assessment: Needs assistance Sitting-balance support: Feet supported Sitting balance-Leahy Scale: Poor Sitting balance - Comments: could sit up to 20 seconds when holding footboard with R UE due to L lateral lean; corrected x 3 with leaning to R elbow then pt coming upright and help to find midline though pt unable to maintain and needing correction                                     Pertinent Vitals/Pain Pain Assessment Pain Assessment: Faces Faces Pain Scale: Hurts little more Pain Location: R LE with movement Pain Descriptors / Indicators: Aching, Discomfort, Grimacing, Guarding Pain Intervention(s): Monitored during session, Limited activity within patient's tolerance, Repositioned    Home Living Family/patient expects to be  discharged to:: Skilled nursing facility                        Prior Function Prior Level of Function : Needs assist             Mobility Comments: was up to chair at SNF via lift recently, had limited progress so d/c from therapy per daughter/sister       Extremity/Trunk Assessment   Upper Extremity Assessment Upper Extremity Assessment: Generalized weakness    Lower Extremity Assessment Lower Extremity Assessment: RLE deficits/detail;LLE deficits/detail RLE Deficits / Details: AAROM/PROM limited knee flexion to about 30 in supine, more in sitting though <60; unable to  activate antigravity in supine nor even wiggle ankles LLE Deficits / Details: AAROM/PROM knee flexion in supine about 45 (in sitting 70) and did not activate despite assistance for lifting leg, flexing knee or ankle AROM    Cervical / Trunk Assessment Cervical / Trunk Assessment: Kyphotic  Communication   Communication Communication: Impaired Factors Affecting Communication: Reduced clarity of speech;Difficulty expressing self    Cognition Arousal: Alert Behavior During Therapy: Flat affect   PT - Cognitive impairments: Orientation, Attention, Initiation, Safety/Judgement, Problem solving   Orientation impairments: Place, Time, Situation                   PT - Cognition Comments: very slow to respond to questions/commands, stated in [place] and at [place] unable to keep focus on working on balance needing mod to max frequent cues and assist Following commands: Impaired Following commands impaired: Follows one step commands with increased time, Follows one step commands inconsistently     Cueing Cueing Techniques: Verbal cues, Tactile cues, Visual cues     General Comments      Exercises     Assessment/Plan    PT Assessment Patient needs continued PT services  PT Problem List Decreased strength;Decreased cognition;Decreased range of motion;Decreased balance;Decreased mobility;Pain;Decreased safety awareness       PT Treatment Interventions DME instruction;Cognitive remediation;Patient/family education;Functional mobility training;Therapeutic activities;Therapeutic exercise;Balance training;Wheelchair mobility training    PT Goals (Current goals can be found in the Care Plan section)  Acute Rehab PT Goals Patient Stated Goal: return to [facility] and get PT again PT Goal Formulation: With patient/family Time For Goal Achievement: 07/29/24 Potential to Achieve Goals: Fair    Frequency Min 1X/week     Co-evaluation               AM-PAC PT 6  Clicks Mobility  Outcome Measure Help needed turning from your back to your side while in a flat bed without using bedrails?: Total Help needed moving from lying on your back to sitting on the side of a flat bed without using bedrails?: Total Help needed moving to and from a bed to a chair (including a wheelchair)?: Total Help needed standing up from a chair using your arms (e.g., wheelchair or bedside chair)?: Total Help needed to walk in hospital room?: Total Help needed climbing 3-5 steps with a railing? : Total 6 Click Score: 6    End of Session   Activity Tolerance: Patient limited by fatigue Patient left: in bed;with call bell/phone within reach;with family/visitor present;with bed alarm set   PT Visit Diagnosis: Other abnormalities of gait and mobility (R26.89);Muscle weakness (generalized) (M62.81);Other symptoms and signs involving the nervous system (R29.898);Pain Pain - Right/Left: Right Pain - part of body: Knee    Time: 8451-8376 PT Time Calculation (min) (ACUTE ONLY): 35 min  Charges:   PT Evaluation $PT Eval High Complexity: 1 High PT Treatments $Therapeutic Activity: 8-22 mins PT General Charges $$ ACUTE PT VISIT: 1 Visit         Micheline Portal, PT Acute Rehabilitation Services Office:[phone] 07/15/2024   Montie Portal 07/15/2024, 5:41 PM

## 2024-07-15 NOTE — Discharge Instructions (Signed)
 Information on my medicine - ELIQUIS  (apixaban )  Why was Eliquis  prescribed for you? Eliquis  was prescribed to treat blood clots that may have been found in the veins of your legs (deep vein thrombosis) or in your lungs (pulmonary embolism) and to reduce the risk of them occurring again.  What do You need to know about Eliquis  ? The starting dose is 10 mg (two 5 mg tablets) taken TWICE daily for the FIRST SEVEN (7) DAYS, then on  07/22/24 (evening dose)  the dose is reduced to ONE 5 mg tablet taken TWICE daily.  Eliquis  may be taken with or without food.   Try to take the dose about the same time in the morning and in the evening. If you have difficulty swallowing the tablet whole please discuss with your pharmacist how to take the medication safely.  Take Eliquis  exactly as prescribed and DO NOT stop taking Eliquis  without talking to the doctor who prescribed the medication.  Stopping may increase your risk of developing a new blood clot.  Refill your prescription before you run out.  After discharge, you should have regular check-up appointments with your healthcare provider that is prescribing your Eliquis .    What do you do if you miss a dose? If a dose of ELIQUIS  is not taken at the scheduled time, take it as soon as possible on the same day and twice-daily administration should be resumed. The dose should not be doubled to make up for a missed dose.  Important Safety Information A possible side effect of Eliquis  is bleeding. You should call your healthcare provider right away if you experience any of the following: Bleeding from an injury or your nose that does not stop. Unusual colored urine (red or dark brown) or unusual colored stools (red or black). Unusual bruising for unknown reasons. A serious fall or if you hit your head (even if there is no bleeding).  Some medicines may interact with Eliquis  and might increase your risk of bleeding or clotting while on Eliquis . To  help avoid this, consult your healthcare provider or pharmacist prior to using any new prescription or non-prescription medications, including herbals, vitamins, non-steroidal anti-inflammatory drugs (NSAIDs) and supplements.  This website has more information on Eliquis  (apixaban ): [email or web address] .[email or web address] dena

## 2024-07-15 NOTE — Progress Notes (Signed)
 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  She was found to have a small PE and acute DVT, was also found to be in A-fib with RVR.  She was placed on amiodarone  infusion, on heparin  infusion and admitted to the hospital.  Chest imaging was also concerning for pneumonia and she was placed on antibiotics with ceftriaxone  and doxycycline .  Subjective / 24h Interval events: She is much more alert today and confusion seems to be improving, however still does not engage in conversation as well  Assesement and Plan: Principal problem Pulmonary embolism, acute DVT -patient has been on Xarelto , CT angiogram on admission showed a right lower lobe segmental pulmonary embolism without right heart strain.  Lower extremity ultrasound reveals a left lower extremity DVT that appears to be acute.  This might represent Xarelto  failure - She is placed on heparin , I spoke with Dr. Lonn, given recent hip surgery, immobility, SNF, DVTs can still happen would not necessarily be considered a full failure, however she does recommend switching to Eliquis  rather than Coumadin given higher Coumadin failure rates than NOACs. - Switch to Eliquis  today  Active problems Community-acquired pneumonia-she did complain of a cough on admission, CT scan concerning for right lower lobe infection/inflammation.  Has been placed on antibiotics, continue for now, monitor fever curve, cultures - She is afebrile this morning  Acute metabolic encephalopathy-likely in the setting of #1 and #2.  Supportive care, treat underlying issues  Paroxysmal A-fib, with RVR-patient  started on amiodarone  infusion, rates are better, she was started on metoprolol , attempt to discontinue amiodarone  infusion  DM 2, with hyperglycemia-A1c 7.5.  Has been placed on sliding scale, continue  CBG (last 3)  Recent Labs    07/14/24 2121 07/15/24 0557 07/15/24 1058  GLUCAP 122* 157* 153*   History of COPD-no wheezing, this is stable, she is on room air  Troponin elevation-flat, not in a pattern consistent with ACS, this is likely due to her PE and potential pneumonia  Acute kidney injury-in the setting of acute illness, most recent creatinine in November was 0.8, on admission was 1.3.  Continue to monitor.  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  Creatinine back to 0.9 today  Hypomagnesemia-replenish magnesium   Chronic diastolic CHF-last echo from 2024 shows LVEF 55 to 60%, grade 1 DD.  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, she would benefit from weight loss  Scheduled Meds:  buPROPion   150 mg Oral BID   Chlorhexidine  Gluconate Cloth  6 each Topical Daily   divalproex   500 mg Oral q morning   insulin  aspart  0-5 Units Subcutaneous QHS   insulin  aspart  0-9 Units Subcutaneous TID WC   metoprolol  tartrate  25 mg Oral BID   pneumococcal 20-valent conjugate vaccine  0.5 mL Intramuscular Tomorrow-1000   sodium chloride  flush  3 mL Intravenous Q12H   Continuous Infusions:  amiodarone  30 mg/hr (07/15/24 1206)   cefTRIAXone  (ROCEPHIN )  IV 2 g (07/14/24 1311)   doxycycline  (VIBRAMYCIN ) IV 100 mg (07/15/24 0535)   heparin  1,300 Units/hr (  07/15/24 1047)   PRN Meds:.acetaminophen  **OR** acetaminophen , ondansetron  **OR** ondansetron  (ZOFRAN ) IV  Current Outpatient Medications  Medication Instructions   amiodarone  (PACERONE ) 100 mg, Oral, Daily   budesonide -formoterol  (SYMBICORT ) 160-4.5 MCG/ACT inhaler 2 puffs, Inhalation, 2 times daily   buPROPion  (WELLBUTRIN  SR) 150 mg,  Oral, 2 times daily   cefTRIAXone  (ROCEPHIN ) 1 g, Daily   divalproex  (DEPAKOTE  ER) 500-1,000 mg, See admin instructions   FEROSUL 325 (65 Fe) MG tablet TAKE ONE TABLET BY MOUTH DAILY AT 9AM WITH BREAKFAST   insulin  aspart (NOVOLOG  FLEXPEN) 100 UNIT/ML FlexPen 0-9 Units, Subcutaneous, 3 times daily with meals CBG < 70: Implement Hypoglycemia measures CBG 70 - 120: 0 units CBG 121 - 150: 1 unit CBG 151 - 200: 2 units CBG 201 - 250: 3 units CBG 251 - 300: 5 units CBG 301 - 350: 7 units CBG 351 - 400: 9 units CBG > 400: call MD   ipratropium-albuterol  (DUONEB) 0.5-2.5 (3) MG/3ML SOLN 3 mLs, Every 6 hours PRN   lidocaine  (XYLOCAINE ) 1 % (with preservative) injection    meclizine  (ANTIVERT ) 25 mg, 3 times daily PRN   metFORMIN  (GLUCOPHAGE ) 1,000 mg, Oral, 2 times daily, TAKE ONE TABLET BY MOUTH TWICE DAILY @ 9AM & 5PM WITH MEALS   metoprolol  tartrate (LOPRESSOR ) 25 mg, Oral, 2 times daily, Pt must keep upcoming followup appt with Cardiology in January 2026 for any more refills. Thank You   ondansetron  (ZOFRAN ) 4 mg, Every 6 hours PRN   oxybutynin  (DITROPAN ) 5 MG tablet Take 1 tablet (5 mg total) by mouth 2-3 (two to three) times daily for bladder spasms.   oxyCODONE  (OXY IR/ROXICODONE ) 5-10 mg, Oral, Every 6 hours PRN   OZEMPIC , 0.25 OR 0.5 MG/DOSE, 2 MG/3ML SOPN INJECT 0.5MG  ONCE WEEKLY   rivaroxaban  (XARELTO ) 20 MG TABS tablet TAKE ONE TABLET DAILY AT 5PM WITH SUPPER   rosuvastatin  (CRESTOR ) 10 mg, Oral, Daily, Pt must keep upcoming followup appt with Cardiology in January 2026 for any more refills. Thank You   sodium chloride  100 mL/hr, Intravenous, 2 times daily, Continuously for 3 days for AKI   tiZANidine  (ZANAFLEX ) 4 mg, Oral, Every 6 hours PRN   traZODone  (DESYREL ) 25 mg, Oral, Daily at bedtime    Diet Orders (From admission, onward)     Start     Ordered   07/14/24 1239  DIET DYS 2 Room service appropriate? Yes with Assist; Fluid consistency: Thin  Diet effective now       Comments:  Please add extra gravy and sauces.  Question Answer Comment  Room service appropriate? Yes with Assist   Fluid consistency: Thin      07/14/24 1239            DVT prophylaxis:    Lab Results  Component Value Date   PLT 214 07/15/2024      Code Status: Full Code  Family Communication: No family at bedside  Status is: Inpatient Remains inpatient appropriate because: Severity of illness   Level of care: Progressive  Consultants:  None  Objective: Vitals:   07/14/24 2348 07/15/24 0324 07/15/24 0730 07/15/24 1049  BP: 106/69 104/66 (!) 125/97 110/73  Pulse: (!) 44 (!) 104 60   Resp:   16   Temp: 97.7 F (36.5 C) 98.5 F (36.9 C) 98.2 F (36.8 C) 98.1 F (36.7 C)  TempSrc: Axillary Axillary Oral Oral  SpO2: 100% 98% 100% 100%  Weight:      Height:  Intake/Output Summary (Last 24 hours) at 07/15/2024 1309 Last data filed at 07/15/2024 0849 Gross per 24 hour  Intake 360 ml  Output 125 ml  Net 235 ml   Wt Readings from Last 3 Encounters:  07/14/24 98.9 kg  06/04/24 108.9 kg  05/14/24 106 kg    Examination:  Constitutional: NAD Eyes: lids and conjunctivae normal, no scleral icterus ENMT: mmm Neck: normal, supple Respiratory: clear to auscultation bilaterally, no wheezing, no crackles. Normal respiratory effort.  Cardiovascular: Regular rate and rhythm, no murmurs / rubs / gallops. No LE edema. Abdomen: soft, no distention, no tenderness. Bowel sounds positive.    Data Reviewed: I have independently reviewed following labs and imaging studies  CBC Recent Labs  Lab 07/13/24 0834 07/14/24 0434 07/15/24 0842  WBC 10.7* 10.9* 9.6  HGB 12.7 11.2* 10.0*  HCT 41.4 36.3 31.7*  PLT 274 211 214  MCV 84.3 82.5 80.1  MCH 25.9* 25.5* 25.3*  MCHC 30.7 30.9 31.5  RDW 19.1* 19.0* 18.6*  LYMPHSABS 2.9  --   --   MONOABS 0.7  --   --   EOSABS 0.1  --   --   BASOSABS 0.1  --   --     Recent Labs  Lab 07/13/24 0834 07/13/24 0845 07/13/24 0937  07/14/24 0434 07/15/24 0842  NA 144  --   --  145 139  K 4.7  --   --  4.9 3.7  CL 111  --   --  114* 108  CO2 19*  --   --  21* 20*  GLUCOSE 164*  --   --  152* 152*  BUN 38*  --   --  26* 18  CREATININE 1.35*  --   --  1.03* 0.91  CALCIUM  9.8  --   --  9.0 8.9  AST 31  --   --  29 26  ALT 9  --   --  8 9  ALKPHOS 61  --   --  53 52  BILITOT 0.3  --   --  <0.2 0.2  ALBUMIN  3.1*  --   --  2.5* 2.5*  MG 2.1  --   --   --  1.4*  PROCALCITON 0.20  --   --   --   --   LATICACIDVEN  --  1.8  --   --   --   TSH 3.740  --   --   --   --   AMMONIA  --   --  21  --   --     ------------------------------------------------------------------------------------------------------------------ No results for input(s): CHOL, HDL, LDLCALC, TRIG, CHOLHDL, LDLDIRECT in the last 72 hours.  Lab Results  Component Value Date   HGBA1C 7.5 (H) 05/06/2024   ------------------------------------------------------------------------------------------------------------------ Recent Labs    07/13/24 0834  TSH 3.740    Cardiac Enzymes No results for input(s): CKMB, TROPONINI, MYOGLOBIN in the last 168 hours.  Invalid input(s): CK ------------------------------------------------------------------------------------------------------------------    Component Value Date/Time   BNP 184.0 (H) 05/22/2021 1453    CBG: Recent Labs  Lab 07/14/24 1123 07/14/24 1626 07/14/24 2121 07/15/24 0557 07/15/24 1058  GLUCAP 139* 152* 122* 157* 153*    Recent Results (from the past 240 hours)  Resp panel by RT-PCR (RSV, Flu A&B, Covid) Anterior Nasal Swab     Status: None   Collection Time: 07/13/24  8:34 AM   Specimen: Anterior Nasal Swab  Result Value Ref Range Status   SARS Coronavirus 2  by RT PCR NEGATIVE NEGATIVE Final   Influenza A by PCR NEGATIVE NEGATIVE Final   Influenza B by PCR NEGATIVE NEGATIVE Final    Comment: (NOTE) The Xpert Xpress SARS-CoV-2/FLU/RSV plus assay is  intended as an aid in the diagnosis of influenza from Nasopharyngeal swab specimens and should not be used as a sole basis for treatment. Nasal washings and aspirates are unacceptable for Xpert Xpress SARS-CoV-2/FLU/RSV testing.  Fact Sheet for Patients: bloggercourse.com  Fact Sheet for Healthcare Providers: seriousbroker.it  This test is not yet approved or cleared by the [place]  FDA and has been authorized for detection and/or diagnosis of SARS-CoV-2 by FDA under an Emergency Use Authorization (EUA). This EUA will remain in effect (meaning this test can be used) for the duration of the COVID-19 declaration under Section 564(b)(1) of the Act, 21 U.S.C. section 360bbb-3(b)(1), unless the authorization is terminated or revoked.     Resp Syncytial Virus by PCR NEGATIVE NEGATIVE Final    Comment: (NOTE) Fact Sheet for Patients: bloggercourse.com  Fact Sheet for Healthcare Providers: seriousbroker.it  This test is not yet approved or cleared by the [place]  FDA and has been authorized for detection and/or diagnosis of SARS-CoV-2 by FDA under an Emergency Use Authorization (EUA). This EUA will remain in effect (meaning this test can be used) for the duration of the COVID-19 declaration under Section 564(b)(1) of the Act, 21 U.S.C. section 360bbb-3(b)(1), unless the authorization is terminated or revoked.  Performed at [facility], [address] [place], [place] [address]   Urine Culture (for pregnant, neutropenic or urologic patients or patients with an indwelling urinary catheter)     Status: Abnormal (Preliminary result)   Collection Time: 07/13/24  2:12 PM   Specimen: Urine, Catheterized  Result Value Ref Range Status   Specimen Description URINE, CATHETERIZED  Final   Special Requests NONE  Final   Culture (A)  Final    60,000 COLONIES/mL ENTEROCOCCUS  FAECIUM CULTURE REINCUBATED FOR BETTER GROWTH Performed at [facility], [address] [place], [place] [address]    Report Status PENDING  Incomplete     Radiology Studies: ECHOCARDIOGRAM COMPLETE Result Date: 07/14/2024    ECHOCARDIOGRAM REPORT   Patient Name:   MELANNY WIRE Date of Exam: 07/14/2024 Medical Rec #:  [id number]      Height:       72.0 in Accession #:    [id number]     Weight:       274.9 lb Date of Birth:  1956-06-06      BSA:          2.439 m Patient Age:    68 years       BP:           132/97 mmHg Patient Gender: F              HR:           77 bpm. Exam Location:  Inpatient Procedure: 2D Echo, Color Doppler, Cardiac Doppler and Intracardiac            Opacification Agent (Both Spectral and Color Flow Doppler were            utilized during procedure). Indications:    Pulmonary Embolus  History:        Patient has prior history of Echocardiogram examinations, most                 recent 08/16/2022. CAD, TIA, Stroke, CVA and COPD,  Arrythmias:Atrial Fibrillation; Risk Factors:Diabetes, Family                 History of Coronary Artery Disease and Current Smoker. Pulmonary                 Embolus.  Sonographer:    Logan Shove RDCS Referring Phys: [id number] RONDELL A SMITH IMPRESSIONS  1. Left ventricular ejection fraction, by estimation, is 55%. The left ventricle has normal function. The left ventricle has no regional wall motion abnormalities. Diastology is indeterminate due to atrial fibrillation.  2. Right ventricular systolic function is normal. The right ventricular size is normal. Tricuspid regurgitation signal is inadequate for assessing PA pressure.  3. The mitral valve is normal in structure. Mild mitral valve regurgitation. No evidence of mitral stenosis.  4. The aortic valve is tricuspid. Aortic valve regurgitation is not visualized. No aortic stenosis is present.  5. The inferior vena cava is normal in size with greater than 50% respiratory variability,  suggesting right atrial pressure of 3 mmHg. Comparison(s): No significant change from prior study. Conclusion(s)/Recommendation(s): No evidence of right heart strain by echocardiogram. Normal LV systolic function. FINDINGS  Left Ventricle: Left ventricular ejection fraction, by estimation, is 55%. The left ventricle has normal function. The left ventricle has no regional wall motion abnormalities. Definity  contrast agent was given IV to delineate the left ventricular endocardial borders. The left ventricular internal cavity size was normal in size. There is no left ventricular hypertrophy. Diastology is indeterminate due to atrial fibrillation. Right Ventricle: The right ventricular size is normal. No increase in right ventricular wall thickness. Right ventricular systolic function is normal. Tricuspid regurgitation signal is inadequate for assessing PA pressure. Left Atrium: Left atrial size was normal in size. Right Atrium: Right atrial size was normal in size. Pericardium: There is no evidence of pericardial effusion. Mitral Valve: The mitral valve is normal in structure. Mild mitral valve regurgitation. No evidence of mitral valve stenosis. Tricuspid Valve: The tricuspid valve is normal in structure. Tricuspid valve regurgitation is trivial. No evidence of tricuspid stenosis. Aortic Valve: The aortic valve is tricuspid. Aortic valve regurgitation is not visualized. No aortic stenosis is present. Aortic valve peak gradient measures 3.8 mmHg. Pulmonic Valve: The pulmonic valve was not well visualized. Pulmonic valve regurgitation is not visualized. No evidence of pulmonic stenosis. Aorta: The aortic root and ascending aorta are structurally normal, with no evidence of dilitation. Venous: The inferior vena cava is normal in size with greater than 50% respiratory variability, suggesting right atrial pressure of 3 mmHg. IAS/Shunts: No atrial level shunt detected by color flow Doppler.  LEFT VENTRICLE PLAX 2D LVIDd:          3.90 cm LVIDs:         3.10 cm LV PW:         1.00 cm LV IVS:        1.00 cm LVOT diam:     2.10 cm LVOT Area:     3.46 cm  LV Volumes (MOD) LV vol d, MOD A2C: 115.0 ml LV vol d, MOD A4C: 108.0 ml LV vol s, MOD A2C: 63.6 ml LV vol s, MOD A4C: 64.0 ml LV SV MOD A2C:     51.4 ml LV SV MOD A4C:     108.0 ml LV SV MOD BP:      52.3 ml RIGHT VENTRICLE          IVC RV Basal diam:  3.00 cm  IVC diam: 1.40 cm LEFT  ATRIUM           Index        RIGHT ATRIUM           Index LA diam:      3.60 cm 1.48 cm/m   RA Area:     11.30 cm LA Vol (A2C): 44.0 ml 18.04 ml/m  RA Volume:   25.80 ml  10.58 ml/m LA Vol (A4C): 49.6 ml 20.34 ml/m  AORTIC VALVE AV Area (Vmax): 3.82 cm AV Vmax:        98.00 cm/s AV Peak Grad:   3.8 mmHg LVOT Vmax:      108.00 cm/s  AORTA Ao Root diam: 2.90 cm Ao Asc diam:  2.90 cm  SHUNTS Systemic Diam: 2.10 cm Georganna Archer Electronically signed by Georganna Archer Signature Date/Time: 07/14/2024/2:56:26 PM    Final      Nilda Fendt, MD, PhD [facility]  Between 7 am - 7 pm I am available, please contact me via Amion (for emergencies) or Securechat (non urgent messages)  Between 7 pm - 7 am I am not available, please contact night coverage MD/APP via Amion  "

## 2024-07-15 NOTE — Progress Notes (Addendum)
 ANTICOAGULATION CONSULT NOTE  Pharmacy Consult for Heparin  Indication: atrial fibrillation, pulmonary embolus, and DVT  Allergies[1]  Patient Measurements: Height: 6' (182.9 cm) Weight: 98.9 kg (218 lb 0.6 oz) IBW/kg (Calculated) : 73.1 Heparin  Dosing Weight: 101.4 kg  Vital Signs: Temp: 98.2 F (36.8 C) (01/05 0730) Temp Source: Oral (01/05 0730) BP: 125/97 (01/05 0730) Pulse Rate: 60 (01/05 0730)  Labs: Recent Labs    07/13/24 0834 07/13/24 1500 07/13/24 2330 07/13/24 2333 07/14/24 0434 07/14/24 0800 07/14/24 1903 07/15/24 0842  HGB 12.7  --   --   --  11.2*  --   --  10.0*  HCT 41.4  --   --   --  36.3  --   --  31.7*  PLT 274  --   --   --  211  --   --  214  APTT  --  58* 127*  --   --   --   --   --   HEPARINUNFRC  --  <0.10*  --    < > 0.61 0.78* 0.65 0.71*  CREATININE 1.35*  --   --   --  1.03*  --   --   --    < > = values in this interval not displayed.    Estimated Creatinine Clearance: 69.8 mL/min (A) (by C-G formula based on SCr of 1.03 mg/dL (H)).   Assessment: 88 yof with a history of HF, AF on Xarelto , bipolar, GERD, knee surgery 2 months ago. Heparin  per pharmacy consult placed for pulmonary embolus. CT w/ PE no RHS. US  w/ Left DVT.  Patient is on Xarelto  prior to arrival. Last dose more than a week ago per patient though she is reported to be altered. Initial heparin  level < 0.10.   Heparin  level is supratherapeutic at 0.71 on 1400 units/hr. No bleeding noted, Hgb down to 10, platelets are normal. No problems with infusion or bleeding noted per RN.  Goal of Therapy:  Heparin  level 0.3-0.7 units/ml Monitor platelets by anticoagulation protocol: Yes   Plan:  Decrease heparin  infusion to 1300 units/hr 6h heparin  level Daily heparin  level and CBC Monitor for signs/symptoms of bleeding  Thank you for involving pharmacy in this patient's care.  Delon Sax, PharmD, BCPS Clinical Pharmacist Clinical phone for 07/15/2024 is [phone] 07/15/2024  9:24 AM  Addendum: Consulted to transition to apixaban .  Stop heparin  drip at the time of first dose of apixaban . Apixaban  10 mg PO bid for 7 days then 5 mg PO bid.  [facility], PharmD, BCPS 2:00 PM        [1]  Allergies Allergen Reactions   Aspirin  Nausea Only and Other (See Comments)    Causes stomach pain

## 2024-07-16 DIAGNOSIS — I2699 Other pulmonary embolism without acute cor pulmonale: Secondary | ICD-10-CM | POA: Diagnosis not present

## 2024-07-16 LAB — BASIC METABOLIC PANEL WITH GFR
Anion gap: 9 (ref 5–15)
BUN: 14 mg/dL (ref 8–23)
CO2: 20 mmol/L — ABNORMAL LOW (ref 22–32)
Calcium: 8.5 mg/dL — ABNORMAL LOW (ref 8.9–10.3)
Chloride: 103 mmol/L (ref 98–111)
Creatinine, Ser: 0.81 mg/dL (ref 0.44–1.00)
GFR, Estimated: >60 mL/min
Glucose, Bld: 146 mg/dL — ABNORMAL HIGH (ref 70–99)
Potassium: 3.5 mmol/L (ref 3.5–5.1)
Sodium: 133 mmol/L — ABNORMAL LOW (ref 135–145)

## 2024-07-16 LAB — CBC
HCT: 28.6 % — ABNORMAL LOW (ref 36.0–46.0)
Hemoglobin: 9.2 g/dL — ABNORMAL LOW (ref 12.0–15.0)
MCH: 25.3 pg — ABNORMAL LOW (ref 26.0–34.0)
MCHC: 32.2 g/dL (ref 30.0–36.0)
MCV: 78.8 fL — ABNORMAL LOW (ref 80.0–100.0)
Platelets: 221 K/uL (ref 150–400)
RBC: 3.63 MIL/uL — ABNORMAL LOW (ref 3.87–5.11)
RDW: 18.2 % — ABNORMAL HIGH (ref 11.5–15.5)
WBC: 8.8 K/uL (ref 4.0–10.5)
nRBC: 0 % (ref 0.0–0.2)

## 2024-07-16 LAB — GLUCOSE, CAPILLARY
Glucose-Capillary: 158 mg/dL — ABNORMAL HIGH (ref 70–99)
Glucose-Capillary: 161 mg/dL — ABNORMAL HIGH (ref 70–99)
Glucose-Capillary: 124 mg/dL — ABNORMAL HIGH (ref 70–99)
Glucose-Capillary: 109 mg/dL — ABNORMAL HIGH (ref 70–99)

## 2024-07-16 LAB — PHOSPHORUS: Phosphorus: 2.3 mg/dL — ABNORMAL LOW (ref 2.5–4.6)

## 2024-07-16 LAB — MAGNESIUM: Magnesium: 1.7 mg/dL (ref 1.7–2.4)

## 2024-07-16 MED ORDER — NICOTINE 14 MG/24HR TD PT24
14.0000 mg | MEDICATED_PATCH | Freq: Every day | TRANSDERMAL | Status: DC
  Administered 2024-07-16 – 2024-07-23 (×5): 14 mg via TRANSDERMAL
  Filled 2024-07-16 (×7): qty 1

## 2024-07-16 MED ORDER — DOXYCYCLINE HYCLATE 100 MG PO TABS
100.0000 mg | ORAL_TABLET | Freq: Two times a day (BID) | ORAL | Status: DC
  Administered 2024-07-17 – 2024-07-19 (×4): 100 mg via ORAL
  Filled 2024-07-16 (×6): qty 1

## 2024-07-16 MED ORDER — MAGNESIUM SULFATE 2 GM/50ML IV SOLN
2.0000 g | Freq: Once | INTRAVENOUS | Status: AC
  Administered 2024-07-16: 2 g via INTRAVENOUS
  Filled 2024-07-16: qty 50

## 2024-07-16 MED ORDER — POTASSIUM & SODIUM PHOSPHATES 280-160-250 MG PO PACK
2.0000 | PACK | ORAL | Status: AC
  Administered 2024-07-16 (×2): 2 via ORAL
  Filled 2024-07-16 (×2): qty 2

## 2024-07-16 NOTE — Evaluation (Signed)
 Occupational Therapy Evaluation Patient Details Name: Rhonda Hickman MRN: [id number] DOB: April 30, 1956 Today's Date: 07/16/2024   History of Present Illness   69 y.o. F adm 1/3 with chest pain and confusion.  Found to have L DVT and PE on workup. 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     Clinical Impressions At this time attempted to complete bed mobility and rolling with max-total x2 assist but would start to shake head. Pt was reposition in bed and pillow placed under R hip. Pt then attempted once reposition in bed with total x2 with mod-max assist to take small sip of water  with cues on pacing self. Patient will benefit from continued inpatient follow up therapy, <3 hours/day.     If plan is discharge home, recommend the following:   Two people to help with walking and/or transfers;Two people to help with bathing/dressing/bathroom;Assistance with cooking/housework;Assistance with feeding;Direct supervision/assist for medications management;Direct supervision/assist for financial management;Assist for transportation;Help with stairs or ramp for entrance;Supervision due to cognitive status     Functional Status Assessment   Patient has had a recent decline in their functional status and demonstrates the ability to make significant improvements in function in a reasonable and predictable amount of time.     Equipment Recommendations    (TBD at next venue)     Recommendations for Other Services         Precautions/Restrictions   Precautions Precautions: Fall Precaution/Restrictions Comments: watch HR Restrictions Weight Bearing Restrictions Per Provider Order: No     Mobility Bed Mobility Overal bed mobility: Needs Assistance Bed Mobility: Rolling Rolling: Total assist, +2 for physical assistance, +2 for  safety/equipment              Transfers                   General transfer comment: deffered      Balance                                           ADL either performed or assessed with clinical judgement   ADL Overall ADL's : Needs assistance/impaired Eating/Feeding: Moderate assistance;Sitting   Grooming: Maximal assistance;Bed level Grooming Details (indicate cue type and reason): +2 Upper Body Bathing: Total assistance;Bed level   Lower Body Bathing: +2 for physical assistance;+2 for safety/equipment;Total assistance;Bed level   Upper Body Dressing : Maximal assistance;Bed level Upper Body Dressing Details (indicate cue type and reason): +2 Lower Body Dressing: Total assistance;+2 for physical assistance;+2 for safety/equipment;Bed level                       Vision         Perception         Praxis         Pertinent Vitals/Pain Pain Assessment Pain Assessment: Faces Faces Pain Scale: Hurts whole lot Pain Location: R LE with movement Pain Descriptors / Indicators: Aching, Discomfort, Grimacing, Guarding Pain Intervention(s): Monitored during session, Repositioned     Extremity/Trunk Assessment Upper Extremity Assessment Upper Extremity Assessment: Generalized weakness;Difficult to assess due to impaired cognition           Communication Communication Communication: Impaired Factors Affecting Communication: Reduced clarity of speech;Difficulty expressing self  Cognition Arousal: Alert Behavior During Therapy: Flat affect Cognition: History of cognitive impairments                               Following commands: Impaired Following commands impaired: Follows one step commands inconsistently     Cueing  General Comments   Cueing Techniques: Verbal cues;Tactile cues;Visual cues      Exercises     Shoulder Instructions      Home Living Family/patient expects to be discharged to::  Skilled nursing facility                                        Prior Functioning/Environment Prior Level of Function : Needs assist             Mobility Comments: was up to chair at SNF via lift recently, had limited progress so d/c from therapy per daughter/sister      OT Problem List: Decreased strength;Decreased activity tolerance;Impaired balance (sitting and/or standing);Decreased knowledge of use of DME or AE;Pain   OT Treatment/Interventions: Self-care/ADL training;Therapeutic exercise;Therapeutic activities;Patient/family education;Balance training      OT Goals(Current goals can be found in the care plan section)   Acute Rehab OT Goals Patient Stated Goal: none OT Goal Formulation: With patient Time For Goal Achievement: 07/30/24 Potential to Achieve Goals: Fair   OT Frequency:  Min 2X/week    Co-evaluation              AM-PAC OT 6 Clicks Daily Activity     Outcome Measure Help from another person eating meals?: A Lot Help from another person taking care of personal grooming?: Total Help from another person toileting, which includes using toliet, bedpan, or urinal?: Total Help from another person bathing (including washing, rinsing, drying)?: Total Help from another person to put on and taking off regular upper body clothing?: Total Help from another person to put on and taking off regular lower body clothing?: Total 6 Click Score: 7   End of Session Nurse Communication: Mobility status (pain)  Activity Tolerance: Patient limited by pain Patient left: in bed;with call bell/phone within reach;with bed alarm set;with family/visitor present  OT Visit Diagnosis: Other abnormalities of gait and mobility (R26.89);Muscle weakness (generalized) (M62.81);Pain Pain - Right/Left: Right Pain - part of body: Hip                Time: 8882-8852 OT Time Calculation (min): 30 min Charges:  OT General Charges $OT Visit: 1 Visit OT  Evaluation $OT Eval Moderate Complexity: 1 Mod OT Treatments $Self Care/Home Management : 8-22 mins  Warrick POUR OTR/L  Acute Rehab Services  [phone] office number   Warrick Berber 07/16/2024, 11:55 AM

## 2024-07-16 NOTE — Progress Notes (Signed)
 " PROGRESS NOTE  Rhonda Hickman FMW:[id number] DOB: [birth year] DOA: 07/13/2024 PCP: Rhonda Aureliano SAUNDERS, MD   LOS: 3 days   Brief Narrative / Interim history: 69 year old female with history of chronic diastolic CHF, PAF on Xarelto , bipolar disorder, GERD, status post right knee replacement October 2025, currently residing in an SNF who comes into the hospital with chest pain as well as increased confusion.  She was found to have a small PE and acute DVT, was also found to be in A-fib with RVR.  She was placed on amiodarone  infusion, on heparin  infusion and admitted to the hospital.  Chest imaging was also concerning for pneumonia and she was placed on antibiotics with ceftriaxone  and doxycycline .  Subjective / 24h Interval events: She is more alert today, still confused, does not engage more in conversation but overall more interactive  Assesement and Plan: Principal problem Pulmonary embolism, acute DVT -patient has been on Xarelto , CT angiogram on admission showed a right lower lobe segmental pulmonary embolism without right heart strain.  Lower extremity ultrasound reveals a left lower extremity DVT that appears to be acute.  This might represent Xarelto  failure - She is placed on heparin , I spoke with Dr. Lonn, given recent hip surgery, immobility, SNF, DVTs can still happen would not necessarily be considered a full failure, however she does recommend switching to Eliquis  rather than Coumadin given higher Coumadin failure rates than NOACs. - Switched to Eliquis  yesterday  Active problems Acute metabolic encephalopathy on underlying mild cognitive impairment-discussed at bedside with the patient's sister and niece yesterday, she has been having a gradual decline over the last year or so, more so after her hip and knee surgeries in the last few months.  They feel like she is significantly declining at this time around and is different than her baseline.  She was normally able to carry a  conversation, but occasionally she would have a bad day - With her Xarelto  failure as above, DVT, PE and underlying A-fib, will obtain an MRI of the brain to rule out underlying CVA that could potentially precipitate worsening mental status  Community-acquired pneumonia-she did complain of a cough on admission, CT scan concerning for right lower lobe infection/inflammation.  Has been placed on antibiotics, continue for now, monitor fever curve, cultures - She is afebrile this morning, plan for total of no more than 5 days  Paroxysmal A-fib, with RVR-patient started on amiodarone  infusion, rates are better, she was started on metoprolol , attempt to discontinue amiodarone  infusion  DM 2, with hyperglycemia-A1c 7.5.  Has been placed on sliding scale, continue  CBG (last 3)  Recent Labs    07/15/24 2046 07/16/24 0554 07/16/24 1103  GLUCAP 188* 158* 161*   History of COPD-no wheezing, this is stable, she is on room air  Troponin elevation-flat, not in a pattern consistent with ACS, this is likely due to her PE and potential pneumonia  Acute kidney injury-in the setting of acute illness, most recent creatinine in November was 0.8, on admission was 1.3.  Continue to monitor.  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  Creatinine back to baseline  Hypomagnesemia-replenish magnesium , 1.7 today  Chronic diastolic CHF-last echo from 2024 shows LVEF 55 to 60%, grade 1 DD.  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, she would benefit from weight loss  Scheduled Meds:  apixaban   10 mg Oral BID   Followed  by   Rhonda Hickman ON 07/22/2024] apixaban   5 mg Oral BID   buPROPion   150 mg Oral BID   Chlorhexidine  Gluconate Cloth  6 each Topical Daily   divalproex   500 mg Oral q morning   doxycycline   100 mg Oral Q12H   insulin  aspart  0-5 Units Subcutaneous QHS   insulin  aspart  0-9 Units Subcutaneous TID WC    metoprolol  tartrate  37.5 mg Oral BID   pneumococcal 20-valent conjugate vaccine  0.5 mL Intramuscular Tomorrow-1000   sodium chloride  flush  3 mL Intravenous Q12H   Continuous Infusions:  cefTRIAXone  (ROCEPHIN )  IV 2 g (07/16/24 1400)   PRN Meds:.acetaminophen  **OR** acetaminophen , ondansetron  **OR** ondansetron  (ZOFRAN ) IV  Current Outpatient Medications  Medication Instructions   amiodarone  (PACERONE ) 100 mg, Oral, Daily   budesonide -formoterol  (SYMBICORT ) 160-4.5 MCG/ACT inhaler 2 puffs, Inhalation, 2 times daily   buPROPion  (WELLBUTRIN  SR) 150 mg, Oral, 2 times daily   cefTRIAXone  (ROCEPHIN ) 1 g, Daily   divalproex  (DEPAKOTE  ER) 500-1,000 mg, See admin instructions   FEROSUL 325 (65 Fe) MG tablet TAKE ONE TABLET BY MOUTH DAILY AT 9AM WITH BREAKFAST   insulin  aspart (NOVOLOG  FLEXPEN) 100 UNIT/ML FlexPen 0-9 Units, Subcutaneous, 3 times daily with meals CBG < 70: Implement Hypoglycemia measures CBG 70 - 120: 0 units CBG 121 - 150: 1 unit CBG 151 - 200: 2 units CBG 201 - 250: 3 units CBG 251 - 300: 5 units CBG 301 - 350: 7 units CBG 351 - 400: 9 units CBG > 400: call MD   ipratropium-albuterol  (DUONEB) 0.5-2.5 (3) MG/3ML SOLN 3 mLs, Every 6 hours PRN   lidocaine  (XYLOCAINE ) 1 % (with preservative) injection    meclizine  (ANTIVERT ) 25 mg, 3 times daily PRN   metFORMIN  (GLUCOPHAGE ) 1,000 mg, Oral, 2 times daily, TAKE ONE TABLET BY MOUTH TWICE DAILY @ 9AM & 5PM WITH MEALS   metoprolol  tartrate (LOPRESSOR ) 25 mg, Oral, 2 times daily, Pt must keep upcoming followup appt with Cardiology in January 2026 for any more refills. Thank You   ondansetron  (ZOFRAN ) 4 mg, Every 6 hours PRN   oxybutynin  (DITROPAN ) 5 MG tablet Take 1 tablet (5 mg total) by mouth 2-3 (two to three) times daily for bladder spasms.   oxyCODONE  (OXY IR/ROXICODONE ) 5-10 mg, Oral, Every 6 hours PRN   OZEMPIC , 0.25 OR 0.5 MG/DOSE, 2 MG/3ML SOPN INJECT 0.5MG  ONCE WEEKLY   rivaroxaban  (XARELTO ) 20 MG TABS tablet TAKE ONE TABLET  DAILY AT 5PM WITH SUPPER   rosuvastatin  (CRESTOR ) 10 mg, Oral, Daily, Pt must keep upcoming followup appt with Cardiology in January 2026 for any more refills. Thank You   sodium chloride  100 mL/hr, Intravenous, 2 times daily, Continuously for 3 days for AKI   tiZANidine  (ZANAFLEX ) 4 mg, Oral, Every 6 hours PRN   traZODone  (DESYREL ) 25 mg, Oral, Daily at bedtime    Diet Orders (From admission, onward)     Start     Ordered   07/14/24 1239  DIET DYS 2 Room service appropriate? Yes with Assist; Fluid consistency: Thin  Diet effective now       Comments: Please add extra gravy and sauces.  Question Answer Comment  Room service appropriate? Yes with Assist   Fluid consistency: Thin      07/14/24 1239            DVT prophylaxis:  apixaban  (ELIQUIS ) tablet 10 mg  apixaban  (ELIQUIS ) tablet 5 mg   Lab Results  Component Value Date   PLT  221 07/16/2024      Code Status: Full Code  Family Communication: No family at bedside  Status is: Inpatient Remains inpatient appropriate because: Severity of illness   Level of care: Progressive  Consultants:  None  Objective: Vitals:   07/15/24 2317 07/16/24 0548 07/16/24 0806 07/16/24 1102  BP: 110/64 118/81 123/69 127/66  Pulse: 87 (!) 105 100 100  Resp: 18 19    Temp: 97.8 F (36.6 C) 98.5 F (36.9 C) 98.5 F (36.9 C) 98.3 F (36.8 C)  TempSrc: Oral Oral Oral Oral  SpO2: 99% 98% 98% 97%  Weight:      Height:        Intake/Output Summary (Last 24 hours) at 07/16/2024 1512 Last data filed at 07/16/2024 0944 Gross per 24 hour  Intake 6 ml  Output 600 ml  Net -594 ml   Wt Readings from Last 3 Encounters:  07/14/24 98.9 kg  06/04/24 108.9 kg  05/14/24 106 kg    Examination:  Constitutional: NAD Eyes: lids and conjunctivae normal, no scleral icterus ENMT: mmm Neck: normal, supple Respiratory: clear to auscultation bilaterally, no wheezing, no crackles. Normal respiratory effort.  Cardiovascular: Regular rate and  rhythm, no murmurs / rubs / gallops. No LE edema. Abdomen: soft, no distention, no tenderness. Bowel sounds positive.    Data Reviewed: I have independently reviewed following labs and imaging studies  CBC Recent Labs  Lab 07/13/24 0834 07/14/24 0434 07/15/24 0842 07/16/24 0245  WBC 10.7* 10.9* 9.6 8.8  HGB 12.7 11.2* 10.0* 9.2*  HCT 41.4 36.3 31.7* 28.6*  PLT 274 211 214 221  MCV 84.3 82.5 80.1 78.8*  MCH 25.9* 25.5* 25.3* 25.3*  MCHC 30.7 30.9 31.5 32.2  RDW 19.1* 19.0* 18.6* 18.2*  LYMPHSABS 2.9  --   --   --   MONOABS 0.7  --   --   --   EOSABS 0.1  --   --   --   BASOSABS 0.1  --   --   --     Recent Labs  Lab 07/13/24 0834 07/13/24 0845 07/13/24 0937 07/14/24 0434 07/15/24 0842 07/16/24 0245  NA 144  --   --  145 139 133*  K 4.7  --   --  4.9 3.7 3.5  CL 111  --   --  114* 108 103  CO2 19*  --   --  21* 20* 20*  GLUCOSE 164*  --   --  152* 152* 146*  BUN 38*  --   --  26* 18 14  CREATININE 1.35*  --   --  1.03* 0.91 0.81  CALCIUM  9.8  --   --  9.0 8.9 8.5*  AST 31  --   --  29 26  --   ALT 9  --   --  8 9  --   ALKPHOS 61  --   --  53 52  --   BILITOT 0.3  --   --  <0.2 0.2  --   ALBUMIN  3.1*  --   --  2.5* 2.5*  --   MG 2.1  --   --   --  1.4* 1.7  PROCALCITON 0.20  --   --   --   --   --   LATICACIDVEN  --  1.8  --   --   --   --   TSH 3.740  --   --   --   --   --  AMMONIA  --   --  21  --   --   --     ------------------------------------------------------------------------------------------------------------------ No results for input(s): CHOL, HDL, LDLCALC, TRIG, CHOLHDL, LDLDIRECT in the last 72 hours.  Lab Results  Component Value Date   HGBA1C 7.5 (H) 05/06/2024   ------------------------------------------------------------------------------------------------------------------ No results for input(s): TSH, T4TOTAL, T3FREE, THYROIDAB in the last 72 hours.  Invalid input(s): FREET3   Cardiac Enzymes No results  for input(s): CKMB, TROPONINI, MYOGLOBIN in the last 168 hours.  Invalid input(s): CK ------------------------------------------------------------------------------------------------------------------    Component Value Date/Time   BNP 184.0 (H) 05/22/2021 1453    CBG: Recent Labs  Lab 07/15/24 1058 07/15/24 1630 07/15/24 2046 07/16/24 0554 07/16/24 1103  GLUCAP 153* 169* 188* 158* 161*    Recent Results (from the past 240 hours)  Resp panel by RT-PCR (RSV, Flu A&B, Covid) Anterior Nasal Swab     Status: None   Collection Time: 07/13/24  8:34 AM   Specimen: Anterior Nasal Swab  Result Value Ref Range Status   SARS Coronavirus 2 by RT PCR NEGATIVE NEGATIVE Final   Influenza A by PCR NEGATIVE NEGATIVE Final   Influenza B by PCR NEGATIVE NEGATIVE Final    Comment: (NOTE) The Xpert Xpress SARS-CoV-2/FLU/RSV plus assay is intended as an aid in the diagnosis of influenza from Nasopharyngeal swab specimens and should not be used as a sole basis for treatment. Nasal washings and aspirates are unacceptable for Xpert Xpress SARS-CoV-2/FLU/RSV testing.  Fact Sheet for Patients: bloggercourse.com  Fact Sheet for Healthcare Providers: seriousbroker.it  This test is not yet approved or cleared by the [place]  FDA and has been authorized for detection and/or diagnosis of SARS-CoV-2 by FDA under an Emergency Use Authorization (EUA). This EUA will remain in effect (meaning this test can be used) for the duration of the COVID-19 declaration under Section 564(b)(1) of the Act, 21 U.S.C. section 360bbb-3(b)(1), unless the authorization is terminated or revoked.     Resp Syncytial Virus by PCR NEGATIVE NEGATIVE Final    Comment: (NOTE) Fact Sheet for Patients: bloggercourse.com  Fact Sheet for Healthcare Providers: seriousbroker.it  This test is not yet approved or  cleared by the [place]  FDA and has been authorized for detection and/or diagnosis of SARS-CoV-2 by FDA under an Emergency Use Authorization (EUA). This EUA will remain in effect (meaning this test can be used) for the duration of the COVID-19 declaration under Section 564(b)(1) of the Act, 21 U.S.C. section 360bbb-3(b)(1), unless the authorization is terminated or revoked.  Performed at [facility], [address] [place], [place] [address]   Urine Culture (for pregnant, neutropenic or urologic patients or patients with an indwelling urinary catheter)     Status: Abnormal (Preliminary result)   Collection Time: 07/13/24  2:12 PM   Specimen: Urine, Catheterized  Result Value Ref Range Status   Specimen Description URINE, CATHETERIZED  Final   Special Requests NONE  Final   Culture (A)  Final    60,000 COLONIES/mL ENTEROCOCCUS FAECIUM 20,000 COLONIES/mL ENTEROCOCCUS FAECALIS SUSCEPTIBILITIES TO FOLLOW Performed at [facility], [address], [place], [place] [address]    Report Status PENDING  Incomplete     Radiology Studies: No results found.    Nilda Fendt, MD, PhD [facility]  Between 7 am - 7 pm I am available, please contact me via Amion (for emergencies) or Securechat (non urgent messages)  Between 7 pm - 7 am I am not available, please contact night  coverage MD/APP via Amion  "

## 2024-07-16 NOTE — Progress Notes (Signed)
" °   07/16/24 1000  Spiritual Encounters  Type of Visit Initial  Care provided to: Patient  Referral source Nurse (RN/NT/LPN)  Reason for visit Advance directives  OnCall Visit No   Chaplain responded to consult request for Advance Directives. The patient stated that she is not interested in the document at the time. Chaplains remain available if needed.    M.Kubra Susanna Kerry Resident [phone] "

## 2024-07-16 NOTE — TOC Progression Note (Addendum)
 Transition of Care [facility]) - Progression Note    Patient Details  Name: Rhonda Hickman MRN: [id number] Date of Birth: [birth year]  Transition of Care [facility]) CM/SW Contact  Luise JAYSON Pan, [place] Phone Number: 07/16/2024, 11:51 AM  Clinical Narrative:   CSW followed up with family about PT rec for SNF. Family is agreeable and appointed Dickey (pts sister) as the primary contact. CSW informed that CSW left Dickey a VM for a call back. Family asked for SNF referrals to be sent to facilities within [place] area. CSW inquired if CSW can send referrals to surrounding areas to [place], family agreed.   3:55 PM CSW spoke with Dickey about patient having bed offers for STR. Dickey asked CSW to email her the offers to [email or web address]. Dickey stated her first choice for patient would be [facility], second choice [facility], and potential third option may be [facility]. CSW to follow up with [facility] as they haven't responded to patient referral. CSW left VM for [employer].   CSW will continue to follow.    Expected Discharge Plan: Skilled Nursing Facility Barriers to Discharge: Continued Medical Work up, SNF Pending bed offer, English As A Second Language Teacher               Expected Discharge Plan and Services In-house Referral: Clinical Social Work, Radio Producer Services: CM Consult   Living arrangements for the past 2 months: Apartment                                       Social Drivers of Health (SDOH) Interventions SDOH Screenings   Food Insecurity: No Food Insecurity (07/14/2024)  Housing: Low Risk (07/14/2024)  Transportation Needs: No Transportation Needs (07/14/2024)  Utilities: Not At Risk (07/14/2024)  Depression (PHQ2-9): Low Risk (03/29/2024)  Social Connections: Moderately Isolated (07/14/2024)  Tobacco Use: High Risk (07/13/2024)    Readmission Risk Interventions     No data to display

## 2024-07-16 NOTE — NC FL2 (Signed)
 " [place]  MEDICAID FL2 LEVEL OF CARE FORM     IDENTIFICATION  Patient Name: Rhonda Hickman Birthdate: 03/26/1956 Sex: female Admission Date (Current Location): 07/13/2024  [place] and [place] Number:  [name] [id number] Facility and Address:  [facility], [address], [place], [place] [address]      Provider Number: [id number]  Attending Physician Name and Address:  Trixie Nilda HERO, MD  Relative Name and Phone Number:  Dorothey Patron  (Home: [phone])  (Work: [phone])  (Mobile: [phone] )    Current Level of Care: Hospital Recommended Level of Care: Skilled Nursing Facility Prior Approval Number:    Date Approved/Denied:   PASRR Number: [id number]  Discharge Plan: SNF    Current Diagnoses: Patient Active Problem List   Diagnosis Date Noted   Community acquired pneumonia 07/13/2024   Acute pulmonary embolism (HCC) 07/13/2024   Pressure injury of skin 07/13/2024   DVT (deep venous thrombosis) (HCC) 07/13/2024   Abnormal urinalysis 07/13/2024   Elevated troponin 07/13/2024   Chronic diastolic CHF (congestive heart failure) (HCC) 07/13/2024   Mood disorder 07/13/2024   Obesity (BMI 30-39.9) 07/13/2024   Dysphagia 07/13/2024   Acute encephalopathy 07/13/2024   Long term current use of antiarrhythmic drug 05/21/2024   Long term (current) use of anticoagulants 05/20/2024   Agatston coronary artery calcium  score less than 100 05/20/2024   Anemia 05/20/2024   Atrial fibrillation/flutter (HCC) 05/20/2024   Atrial flutter (HCC) 05/16/2024   Small bowel obstruction (HCC) 05/16/2024   Status post right knee replacement 05/14/2024   AMS (altered mental status) 05/14/2024   Status post total right knee replacement 05/10/2024   OA (osteoarthritis) of knee 06/20/2023   Status post total left knee replacement 06/20/2023   Lumbar radiculopathy 02/01/2023   S/P left knee arthroscopy11/29/22 07/13/2021   Tear of meniscus of knee joint     Acute lower UTI 05/23/2021   A-fib (HCC) 05/22/2021   Leukocytosis 05/22/2021   Fall at home, initial encounter 05/22/2021   HLD (hyperlipidemia) 05/22/2021   Hypertension 03/18/2020   Bipolar 1 disorder (HCC)    AKI (acute kidney injury) 12/18/2019   Chronic atrial fibrillation (HCC) 12/03/2019   TIA (transient ischemic attack) 12/03/2019   Diabetic peripheral neuropathy (HCC) 12/03/2019   Chronic migraine 12/03/2019   Vertigo 08/03/2019   Bronchiectasis with acute exacerbation (HCC)    Obesity, Class III, BMI 40-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) (HCC)    Gastroesophageal reflux disease    Asthma exacerbation 06/06/2018   Gait abnormality 08/29/2017   Paresthesia 08/29/2017   Derangement of posterior horn of medial meniscus of right knee    Meniscus, lateral, derangement, right    Unilateral primary osteoarthritis, right knee    COPD (chronic obstructive pulmonary disease) (HCC) 03/09/2016   Type 2 diabetes mellitus (HCC) 02/16/2007   Cocaine abuse (HCC) 02/16/2007   Extrinsic asthma 02/16/2007   HOMELESSNESS, HX OF 02/16/2007    Orientation RESPIRATION BLADDER Height & Weight     Self, Place  Normal Incontinent, Indwelling catheter Weight: 218 lb 0.6 oz (98.9 kg) Height:  6' (182.9 cm)  BEHAVIORAL SYMPTOMS/MOOD NEUROLOGICAL BOWEL NUTRITION STATUS      Incontinent Diet (Please see discharge summary)  AMBULATORY STATUS COMMUNICATION OF NEEDS Skin   Extensive Assist Verbally PU Stage and Appropriate Care (Pressure Injury Coccyx Stage 2;  Pressure Injury Thigh Anterior;Left;Proximal Stage 2)                       Personal Care Assistance  Level of Assistance  Bathing, Dressing, Feeding Bathing Assistance: Maximum assistance Feeding assistance: Limited assistance Dressing Assistance: Maximum assistance     Functional Limitations Info  Sight, Hearing, Speech Sight Info: Impaired Hearing Info: Impaired Speech Info: Adequate    SPECIAL CARE FACTORS FREQUENCY  PT (By  licensed PT), OT (By licensed OT)     PT Frequency: 5x week OT Frequency: 5x week            Contractures Contractures Info: Not present    Additional Factors Info  Code Status, Psychotropic, Insulin  Sliding Scale Code Status Info: Full   Psychotropic Info: Wellbutrin  SR , Depakote  ER Insulin  Sliding Scale Info: Please see discharge summary       Current Medications (07/16/2024):  This is the current hospital active medication list Current Facility-Administered Medications  Medication Dose Route Frequency Provider Last Rate Last Admin   acetaminophen  (TYLENOL ) tablet 650 mg  650 mg Oral Q6H PRN Smith, Rondell A, MD   650 mg at 07/15/24 1336   Or   acetaminophen  (TYLENOL ) suppository 650 mg  650 mg Rectal Q6H PRN Claudene Maximino LABOR, MD       apixaban  (ELIQUIS ) tablet 10 mg  10 mg Oral BID [place], Jennifer D, RPH   10 mg at 07/16/24 9056   Followed by   [name] ON 07/22/2024] apixaban  (ELIQUIS ) tablet 5 mg  5 mg Oral BID [place], Jennifer D, [facility]       buPROPion  (WELLBUTRIN  SR) 12 hr tablet 150 mg  150 mg Oral BID Smith, Rondell A, MD   150 mg at 07/16/24 0943   cefTRIAXone  (ROCEPHIN ) 2 g in sodium chloride  0.9 % 100 mL IVPB  2 g Intravenous Q24H Smith, Rondell A, MD 200 mL/hr at 07/15/24 1345 2 g at 07/15/24 1345   Chlorhexidine  Gluconate Cloth 2 % PADS 6 each  6 each Topical Daily Gherghe, Costin M, MD   6 each at 07/16/24 9056   divalproex  (DEPAKOTE  ER) 24 hr tablet 500 mg  500 mg Oral q morning Smith, Rondell A, MD   500 mg at 07/16/24 9056   doxycycline  (VIBRA -TABS) tablet 100 mg  100 mg Oral Q12H Gherghe, Costin M, MD       insulin  aspart (novoLOG ) injection 0-5 Units  0-5 Units Subcutaneous QHS Smith, Rondell A, MD       insulin  aspart (novoLOG ) injection 0-9 Units  0-9 Units Subcutaneous TID WC Smith, Rondell A, MD   2 Units at 07/16/24 1155   metoprolol  tartrate (LOPRESSOR ) tablet 37.5 mg  37.5 mg Oral BID Gherghe, Costin M, MD   37.5 mg at 07/16/24 9056   ondansetron  (ZOFRAN )  tablet 4 mg  4 mg Oral Q6H PRN Smith, Rondell A, MD       Or   ondansetron  (ZOFRAN ) injection 4 mg  4 mg Intravenous Q6H PRN Smith, Rondell A, MD       pneumococcal 20-valent conjugate vaccine (PREVNAR 20 ) injection 0.5 mL  0.5 mL Intramuscular Tomorrow-1000 Gherghe, Costin M, MD       potassium & sodium phosphates  (PHOS-NAK) 280-160-250 MG packet 2 packet  2 packet Oral Q4H Gherghe, Costin M, MD   2 packet at 07/16/24 0956   sodium chloride  flush (NS) 0.9 % injection 3 mL  3 mL Intravenous Q12H Claudene Maximino A, MD   3 mL at 07/16/24 0944     Discharge Medications: Please see discharge summary for a list of discharge medications.  Relevant Imaging Results:  Relevant Lab Results:  Additional Information SSN [id number]  Luise JAYSON Pan, LCSWA     "

## 2024-07-16 NOTE — Progress Notes (Signed)
 This patient is receiving the  Doxycycline  by the intravenous route. Based on criteria approved by the Pharmacy and Therapeutics  Committee,  Doxycyline is / are being converted to equivalent oral dose form(s).  These criteria include:  A. Has a documented ability to take oral medications (tolerating oral or gastric tube feedings for  >24 hours OR taking other scheduled oral medications for >24 hours).   B. Expected plan for continued treatment for at least 1 day.   C. Has documented clinical improvement; the patient must demonstrate:  1. 24-hour maximum temperature of <100.5 F  2. MD/patient assessment of improvement  If you have questions about this conversion, please contact the pharmacy department.  Thank you.   Rankin Sams, PharmD, BCCCP Clinical Pharmacist

## 2024-07-17 ENCOUNTER — Inpatient Hospital Stay (HOSPITAL_COMMUNITY)

## 2024-07-17 DIAGNOSIS — I2699 Other pulmonary embolism without acute cor pulmonale: Secondary | ICD-10-CM | POA: Diagnosis not present

## 2024-07-17 DIAGNOSIS — I4891 Unspecified atrial fibrillation: Secondary | ICD-10-CM | POA: Diagnosis not present

## 2024-07-17 DIAGNOSIS — N390 Urinary tract infection, site not specified: Secondary | ICD-10-CM

## 2024-07-17 LAB — CBC
HCT: 30.1 % — ABNORMAL LOW (ref 36.0–46.0)
Hemoglobin: 9.8 g/dL — ABNORMAL LOW (ref 12.0–15.0)
MCH: 25.6 pg — ABNORMAL LOW (ref 26.0–34.0)
MCHC: 32.6 g/dL (ref 30.0–36.0)
MCV: 78.6 fL — ABNORMAL LOW (ref 80.0–100.0)
Platelets: 207 K/uL (ref 150–400)
RBC: 3.83 MIL/uL — ABNORMAL LOW (ref 3.87–5.11)
RDW: 18.7 % — ABNORMAL HIGH (ref 11.5–15.5)
WBC: 10.9 K/uL — ABNORMAL HIGH (ref 4.0–10.5)
nRBC: 0.2 % (ref 0.0–0.2)

## 2024-07-17 LAB — GLUCOSE, CAPILLARY
Glucose-Capillary: 155 mg/dL — ABNORMAL HIGH (ref 70–99)
Glucose-Capillary: 151 mg/dL — ABNORMAL HIGH (ref 70–99)
Glucose-Capillary: 120 mg/dL — ABNORMAL HIGH (ref 70–99)
Glucose-Capillary: 178 mg/dL — ABNORMAL HIGH (ref 70–99)
Glucose-Capillary: 116 mg/dL — ABNORMAL HIGH (ref 70–99)

## 2024-07-17 LAB — URINE CULTURE: Culture: 60000 — AB

## 2024-07-17 LAB — BASIC METABOLIC PANEL WITH GFR
Anion gap: 8 (ref 5–15)
BUN: 11 mg/dL (ref 8–23)
CO2: 22 mmol/L (ref 22–32)
Calcium: 8.5 mg/dL — ABNORMAL LOW (ref 8.9–10.3)
Chloride: 108 mmol/L (ref 98–111)
Creatinine, Ser: 0.77 mg/dL (ref 0.44–1.00)
GFR, Estimated: >60 mL/min
Glucose, Bld: 115 mg/dL — ABNORMAL HIGH (ref 70–99)
Potassium: 3.6 mmol/L (ref 3.5–5.1)
Sodium: 139 mmol/L (ref 135–145)

## 2024-07-17 MED ORDER — LORAZEPAM 2 MG/ML IJ SOLN: 0.5000 mg | INTRAMUSCULAR | Status: DC | PRN

## 2024-07-17 MED ORDER — GERHARDT'S BUTT CREAM
TOPICAL_CREAM | Freq: Every day | CUTANEOUS | Status: DC
  Administered 2024-07-19: 1 via TOPICAL
  Filled 2024-07-17 (×2): qty 60

## 2024-07-17 NOTE — TOC Progression Note (Addendum)
 Transition of Care [facility]) - Progression Note    Patient Details  Name: Rhonda Hickman MRN: [id number] Date of Birth: 03-08-1956  Transition of Care [facility]) CM/SW Contact  Luise JAYSON Pan, [place] Phone Number: 07/17/2024, 10:15 AM  Clinical Narrative:   Marinda with [facility] called CSW to inform that facility is unable to offer a bed at this time. CSW called [facility] to inform but her voicemail box is full. CSW to follow up.  11:14 AM Sheree with [facility] called CSW to inform that she received a call from patients sis Dickey, to inquire about patient potentially going to facility for STR. Sheree informed CSW that the referral has been pulled and provided to her admissions board for review.  12:42 PM [facility] called CSW to inform that family would like [facility] at this time. CSW selected [facility]. CSW to submit for auth and follow up with facility about patient. CSW left VM for Laverne with admissions at [facility].  1:51 PM [employer] informed CSW that facility can offer a short term rehab and transition patient to long term care if needed as well. CSW notified family . Family asked CSW to follow up with [facility].  CSW spoke with Ashley Hamilton [phone]), AD at [facility], about patient going to facility for STR and possibly transitioning to long term care. Per Ashley, facility can accommodate for STR or LTC. CSW submitted for insurance shara shara id [id number], for [facility] SNF.   CSW will continue to follow.    Expected Discharge Plan: Skilled Nursing Facility Barriers to Discharge: Continued Medical Work up, SNF Pending bed offer, English As A Second Language Teacher               Expected Discharge Plan and Services In-house Referral: Clinical Social Work, Radio Producer Services: CM Consult   Living arrangements for the past 2 months: Apartment                                       Social Drivers of Health (SDOH) Interventions SDOH  Screenings   Food Insecurity: No Food Insecurity (07/14/2024)  Housing: Low Risk (07/14/2024)  Transportation Needs: No Transportation Needs (07/14/2024)  Utilities: Not At Risk (07/14/2024)  Depression (PHQ2-9): Low Risk (03/29/2024)  Social Connections: Moderately Isolated (07/14/2024)  Tobacco Use: High Risk (07/13/2024)    Readmission Risk Interventions     No data to display

## 2024-07-17 NOTE — Plan of Care (Signed)
   Problem: Education: Goal: Knowledge of General Education information will improve Description: Including pain rating scale, medication(s)/side effects and non-pharmacologic comfort measures Outcome: Progressing   Problem: Skin Integrity: Goal: Risk for impaired skin integrity will decrease Outcome: Progressing

## 2024-07-17 NOTE — Progress Notes (Addendum)
 Pt is refusing all meds. Rn educated pt on the importance of meds. Pt still refused. MD Cindy notified.

## 2024-07-17 NOTE — Progress Notes (Signed)
 SLP Cancellation Note  Patient Details Name: Rhonda Hickman MRN: [id number] DOB: 1956-01-31   Cancelled treatment:        Pt out of room. Will continue efforts   Dustin Olam Bull 07/17/2024, 3:22 PM

## 2024-07-17 NOTE — Progress Notes (Signed)
 " Progress Note   Patient: Rhonda Hickman FMW:[id number] DOB: [birth year] DOA: 07/13/2024     4 DOS: the patient was seen and examined on 07/17/2024   Brief hospital course: 69 year old female with history of chronic diastolic CHF, PAF on Xarelto , bipolar disorder, GERD, status post right knee replacement October 2025, currently residing in an SNF who comes into the hospital with chest pain as well as increased confusion. She was found to have a small PE and acute DVT, was also found to be in A-fib with RVR. She was placed on amiodarone  infusion, on heparin  infusion and admitted to the hospital. Chest imaging was also concerning for pneumonia and she was placed on antibiotics with ceftriaxone  and doxycycline .   Assessment and Plan: Principal problem Pulmonary embolism, acute DVT -patient has been on Xarelto , CT angiogram on admission showed a right lower lobe segmental pulmonary embolism without right heart strain.  Lower extremity ultrasound reveals a left lower extremity DVT that appears to be acute.  This might represent Xarelto  failure - She was placed on heparin , Dr. Trixie spoke with Dr. Lonn, given recent hip surgery, immobility, SNF, DVTs can still happen would not necessarily be considered a full failure, however she does recommend switching to Eliquis  rather than Coumadin given higher Coumadin failure rates than NOACs. - Switched to Eliquis    Active problems Acute metabolic encephalopathy on underlying mild cognitive impairment-Dr. Trixie discussed at bedside with the patient's sister and niece, she has been having a gradual decline over the last year or so, more so after her hip and knee surgeries in the last few months.  They feel like she is significantly declining at this time around and is different than her baseline.  She was normally able to carry a conversation, but occasionally she would have a bad day - With her Xarelto  failure as above, DVT, PE and underlying A-fib, MRI of the  brain to rule out underlying CVA was ordered   Community-acquired pneumonia-she did complain of a cough on admission, CT scan concerning for right lower lobe infection/inflammation.  Has been placed on antibiotics, continue for now, monitor fever curve, cultures - plan for total of no more than 5 days   Paroxysmal A-fib, with RVR-patient started on amiodarone  infusion, rates are better, she was started on metoprolol , attempt to discontinue amiodarone  infusion   DM 2, with hyperglycemia-A1c 7.5.  Has been placed on sliding scale, continue   History of COPD-no wheezing, this is stable, she is on room air   Troponin elevation-flat, not in a pattern consistent with ACS, this is likely due to her PE and potential pneumonia   Acute kidney injury-in the setting of acute illness, most recent creatinine in November was 0.8, on admission was 1.3.  Continue to monitor.  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  Creatinine back to baseline   Hypomagnesemia-replenish magnesium  as needed   Chronic diastolic CHF-last echo from 2024 shows LVEF 55 to 60%, grade 1 DD.  Currently appears euvolemic   Obesity, class II-BMI 37, she would benefit from weight loss      Subjective: Difficult to assess given mentation  Physical Exam: Vitals:   07/16/24 2350 07/17/24 0400 07/17/24 0733 07/17/24 1108  BP: 116/60 124/71 137/74 114/63  Pulse: 99 100 100 94  Resp: 18 13 20 14   Temp: 98.6 F (37 C) 98.6 F (37 C) 98.6 F (37 C) 98.4  F (36.9 C)  TempSrc: Oral Axillary Axillary Axillary  SpO2: 97% 99% 99% 97%  Weight:      Height:       General exam: Awake, laying in bed, in nad Respiratory system: Normal respiratory effort, no wheezing Cardiovascular system: regular rate, s1, s2 Gastrointestinal system: Soft, nondistended, positive BS Central nervous system: CN2-12 grossly intact, strength intact Extremities: Perfused,  no clubbing Skin: Normal skin turgor, no notable skin lesions seen Psychiatry: difficult to assess given mentation  Data Reviewed:  Labs reviewed: Na 139, K 3.6, Cr 0.77, WBC 10.9, Hgb 9.8, Plts 207  Family Communication: Pt in room, family not at bedside  Disposition: Status is: Inpatient Remains inpatient appropriate because: severity of illness  Planned Discharge Destination: Skilled nursing facility    Author: Garnette Pelt, MD 07/17/2024 2:34 PM  For on call review [email or web address].  "

## 2024-07-17 NOTE — Progress Notes (Signed)
 Pt took all her morning medication @ 1400 after Family member (Sister) spoke/persuaded pt.

## 2024-07-17 NOTE — Consult Note (Signed)
 WOC Nurse Consult Note: Reason for Consult: wounds to heels and coccyx  Wound type:  1.  Deep Tissue Pressure Injury L heel purple maroon discoloration; R heel ? DTPI with dark discoloration  2.  Stage 3 Pressure Injury sacrum and medial buttocks  70% red moist 30% tan and gray tissue noted  Pressure Injury POA: sacral wound present on admission (see photo 1/4), no photo of heels on admission, not placed on flowsheet until 1/7 therefore not present on admission  Measurement: see nursing flowsheet  Wound bed: as above  Drainage (amount, consistency, odor) see nursing flowsheet  Periwound: purple maroon discoloration noted to sacrum/buttocks indicating sacral wound began as deep tissue pressure injury  Dressing procedure/placement/frequency:  Cleanse sacral/medial buttocks wounds with Vashe, do not rinse.  Apply Xeroform gauze ([place] [phone]) to wound bed daily and secure with silicone foam.  Lift foam daily to replace Xeroform, change q3 days and prn soiling.  Cleanse B heels with soap and water , dry and apply silicone heel protectors. Place B feet into Prevalon boots to offload pressure Gerlean [phone].   POC discussed with bedside nurse. WOC team will follow heels every 7 to 10 days to evaluate and change POC as needed.   Thank you,    Powell Bar MSN, RN-BC, [employer]

## 2024-07-17 NOTE — Plan of Care (Signed)
  Problem: Fluid Volume: Goal: Ability to maintain a balanced intake and output will improve Outcome: Progressing   Problem: Health Behavior/Discharge Planning: Goal: Ability to manage health-related needs will improve Outcome: Progressing   Problem: Metabolic: Goal: Ability to maintain appropriate glucose levels will improve Outcome: Progressing   Problem: Skin Integrity: Goal: Risk for impaired skin integrity will decrease Outcome: Progressing

## 2024-07-17 NOTE — Hospital Course (Signed)
 69 year old female with history of chronic diastolic CHF, PAF on Xarelto , bipolar disorder, GERD, status post right knee replacement October 2025, currently residing in an SNF who comes into the hospital with chest pain as well as increased confusion. She was found to have a small PE and acute DVT, was also found to be in A-fib with RVR. She was placed on amiodarone  infusion, on heparin  infusion and admitted to the hospital. Chest imaging was also concerning for pneumonia and she was placed on antibiotics with ceftriaxone  and doxycycline .

## 2024-07-18 DIAGNOSIS — I2699 Other pulmonary embolism without acute cor pulmonale: Secondary | ICD-10-CM | POA: Diagnosis not present

## 2024-07-18 DIAGNOSIS — N390 Urinary tract infection, site not specified: Secondary | ICD-10-CM | POA: Diagnosis not present

## 2024-07-18 DIAGNOSIS — I4891 Unspecified atrial fibrillation: Secondary | ICD-10-CM | POA: Diagnosis not present

## 2024-07-18 LAB — GLUCOSE, CAPILLARY
Glucose-Capillary: 138 mg/dL — ABNORMAL HIGH (ref 70–99)
Glucose-Capillary: 176 mg/dL — ABNORMAL HIGH (ref 70–99)
Glucose-Capillary: 202 mg/dL — ABNORMAL HIGH (ref 70–99)
Glucose-Capillary: 145 mg/dL — ABNORMAL HIGH (ref 70–99)

## 2024-07-18 LAB — CBC
HCT: 26.3 % — ABNORMAL LOW (ref 36.0–46.0)
Hemoglobin: 8.7 g/dL — ABNORMAL LOW (ref 12.0–15.0)
MCH: 25.4 pg — ABNORMAL LOW (ref 26.0–34.0)
MCHC: 33.1 g/dL (ref 30.0–36.0)
MCV: 76.7 fL — ABNORMAL LOW (ref 80.0–100.0)
Platelets: 199 K/uL (ref 150–400)
RBC: 3.43 MIL/uL — ABNORMAL LOW (ref 3.87–5.11)
RDW: 18.4 % — ABNORMAL HIGH (ref 11.5–15.5)
WBC: 8.5 K/uL (ref 4.0–10.5)
nRBC: 0 % (ref 0.0–0.2)

## 2024-07-18 LAB — COMPREHENSIVE METABOLIC PANEL WITH GFR
ALT: 9 U/L (ref 0–44)
AST: 20 U/L (ref 15–41)
Albumin: 2.1 g/dL — ABNORMAL LOW (ref 3.5–5.0)
Alkaline Phosphatase: 49 U/L (ref 38–126)
Anion gap: 8 (ref 5–15)
BUN: 9 mg/dL (ref 8–23)
CO2: 22 mmol/L (ref 22–32)
Calcium: 8.4 mg/dL — ABNORMAL LOW (ref 8.9–10.3)
Chloride: 106 mmol/L (ref 98–111)
Creatinine, Ser: 0.68 mg/dL (ref 0.44–1.00)
GFR, Estimated: >60 mL/min
Glucose, Bld: 132 mg/dL — ABNORMAL HIGH (ref 70–99)
Potassium: 3.3 mmol/L — ABNORMAL LOW (ref 3.5–5.1)
Sodium: 135 mmol/L (ref 135–145)
Total Bilirubin: <0.2 mg/dL (ref 0.0–1.2)
Total Protein: 5.6 g/dL — ABNORMAL LOW (ref 6.5–8.1)

## 2024-07-18 MED ORDER — POTASSIUM CHLORIDE CRYS ER 20 MEQ PO TBCR
60.0000 meq | EXTENDED_RELEASE_TABLET | Freq: Once | ORAL | Status: AC
  Administered 2024-07-18: 60 meq via ORAL
  Filled 2024-07-18: qty 3

## 2024-07-18 NOTE — Progress Notes (Signed)
 Speech Language Pathology Treatment: Dysphagia  Patient Details Name: Rhonda Hickman MRN: [id number] DOB: 14-May-1956 Today's Date: 07/18/2024 Time: 9045-8988 SLP Time Calculation (min) (ACUTE ONLY): 17 min  Assessment / Plan / Recommendation Clinical Impression  Pt demonstrates tolerance of diet. No signs of aspiration. SLP will sign off.   HPI HPI: Pt is a 69 y.o. female  who presented 1/3 with complaints of chest pain. CT angiogram of the chest: right lower lobe segmental pulmonary embolus, right lower lobe peribronchial tree-in-bud nodularity concerning for infection/inflammation. SLP consulted due to concern that patient could not safely swallow. PMH: diastolic congestive heart failure, atrial fibrillation on Xarelto , bipolar disorder, GERD, and status post right knee replacement on 05/10/2024      SLP Plan           Swallow Evaluation Recommendations   Recommendations: PO diet PO Diet Recommendation: Dysphagia 2 (Finely chopped);Thin liquids (Level 0) Liquid Administration via: Cup;Straw Medication Administration: Whole meds with puree Supervision: Full assist for feeding Postural changes: Position pt fully upright for meals Oral care recommendations: Oral care BID (2x/day)     Recommendations                     Oral care BID   Frequent or constant Supervision/Assistance Dysphagia, unspecified (R13.10)           Linden Mikes, Consuelo Fitch  07/18/2024, 10:22 AM

## 2024-07-18 NOTE — Care Management Important Message (Signed)
 Important Message  Patient Details  Name: Rhonda Hickman MRN: [id number] Date of Birth: March 22, 1956   Important Message Given:  Yes - Medicare IM     Vonzell Arrie Sharps 07/18/2024, 10:27 AM

## 2024-07-18 NOTE — TOC Progression Note (Signed)
 Transition of Care [facility]) - Progression Note    Patient Details  Name: Rhonda Hickman MRN: [id number] Date of Birth: [birth year]  [facility]) CM/SW Contact  Luise JAYSON Pan, [place] Phone Number: 07/18/2024, 9:00 AM  Clinical Narrative:   Shara still pending at this time.  CSW will continue to follow.    Expected Discharge Plan: Skilled Nursing Facility Barriers to Discharge: Continued Medical Work up, SNF Pending bed offer, English As A Second Language Teacher               Expected Discharge Plan and Services In-house Referral: Clinical Social Work, Radio Producer Services: CM Consult   Living arrangements for the past 2 months: Apartment                                       Social Drivers of Health (SDOH) Interventions SDOH Screenings   Food Insecurity: No Food Insecurity (07/14/2024)  Housing: Low Risk (07/14/2024)  Transportation Needs: No Transportation Needs (07/14/2024)  Utilities: Not At Risk (07/14/2024)  Depression (PHQ2-9): Low Risk (03/29/2024)  Social Connections: Moderately Isolated (07/14/2024)  Tobacco Use: High Risk (07/13/2024)    Readmission Risk Interventions     No data to display

## 2024-07-18 NOTE — Progress Notes (Signed)
 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 She was found to have a small PE and acute DVT, was also found to be in A-fib with RVR. She was placed on amiodarone  infusion, on heparin  infusion and admitted to the hospital. Chest imaging was also concerning for pneumonia and she was placed on antibiotics with ceftriaxone  and doxycycline .   Assessment and Plan: Principal problem Pulmonary embolism, acute DVT -patient has been on Xarelto , CT angiogram on admission showed a right lower lobe segmental pulmonary embolism without right heart strain.  Lower extremity ultrasound reveals a left lower extremity DVT that appears to be acute.  This might represent Xarelto  failure - She was placed on heparin , Dr. Trixie spoke with Dr. Lonn, given recent hip surgery, immobility, SNF, DVTs can still happen would not necessarily be considered a full failure, however she does recommend switching to Eliquis  rather than Coumadin given higher Coumadin failure rates than NOACs. - Now on Eliquis  -Pending SNF placement   Active problems Acute metabolic encephalopathy on underlying mild cognitive impairment-Dr. Trixie discussed at bedside with the patient's sister and niece, she has been having a gradual decline over the last year or so, more so after her hip and knee surgeries in the last few months.  They feel like she is significantly declining at this time around and is different than her baseline.  She was normally able to carry a conversation, but occasionally she would have a bad day - MRI brain reviewed. Neg   Community-acquired pneumonia-she  did complain of a cough on admission, CT scan concerning for right lower lobe infection/inflammation.  Has been placed on antibiotics, continue for now, monitor fever curve, cultures - plan for total of no more than 5 days of tx   Paroxysmal A-fib, with RVR-patient was on amiodarone  infusion, rates are better, she was started on metoprolol    DM 2, with hyperglycemia-A1c 7.5.  Has been placed on sliding scale, continue   History of COPD-no wheezing, this is stable, on minimal O2 support   Troponin elevation-flat, not in a pattern consistent with ACS, this is likely due to her PE and potential pneumonia   Acute kidney injury-in the setting of acute illness, most recent creatinine in November was 0.8, on admission was 1.3.  Continue to monitor.  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  Creatinine back to baseline   Hypomagnesemia-replenish magnesium  as needed   Chronic diastolic CHF-last echo from 2024 shows LVEF 55 to 60%, grade 1 DD.  Currently appears euvolemic   Obesity, class II-BMI 37, she would benefit from weight loss      Subjective: Asking for vanilla ice cream when seen  Physical Exam: Vitals:   07/17/24 2112 07/17/24 2346 07/18/24 0341 07/18/24 0732  BP: 126/67 106/71 106/72 97/81  Pulse: 92 93 89 95  Resp:  17 14 14   Temp:  98.5 F (36.9 C) 98.4 F (36.9 C) 98.3 F (36.8 C)  TempSrc:  Oral Oral Oral  SpO2:  99% 98% 99%  Weight:  Height:       General exam: Conversant, in no acute distress Respiratory system: normal chest rise, clear, no audible wheezing Cardiovascular system: regular rhythm, s1-s2 Gastrointestinal system: Nondistended, nontender, pos BS Central nervous system: No seizures, no tremors Extremities: No cyanosis, no joint deformities Skin: No rashes, no pallor Psychiatry: difficult to assess given mentation  Data Reviewed:  Labs reviewed: Na 135, K 3.3, Cr  0.68  Family Communication: Pt in room, family not at bedside  Disposition: Status is: Inpatient Remains inpatient appropriate because: severity of illness  Planned Discharge Destination: Skilled nursing facility    Author: Garnette Pelt, MD 07/18/2024 10:20 AM  For on call review [email or web address]  "

## 2024-07-18 NOTE — Progress Notes (Signed)
 PT Cancellation Note  Patient Details Name: Rhonda Hickman MRN: [id number] DOB: 01/17/1956   Cancelled Treatment:    Reason Eval/Treat Not Completed: Other (comment) (Pt refused reporting she doesn't understand why PT tries to work with her everyday (last seen 07/16/24). Therapist provided education on the importance of frequent mobilization with pt continuing to refuse. PT will follow up when able.)  Leontine Hilt DPT Acute Rehab Services [phone] Prefer contact via chat   Leontine KATHEE Hilt 07/18/2024, 4:29 PM

## 2024-07-18 NOTE — Plan of Care (Signed)
  Problem: Skin Integrity: Goal: Risk for impaired skin integrity will decrease Outcome: Progressing   Problem: Education: Goal: Knowledge of General Education information will improve Description: Including pain rating scale, medication(s)/side effects and non-pharmacologic comfort measures Outcome: Progressing

## 2024-07-19 DIAGNOSIS — N390 Urinary tract infection, site not specified: Secondary | ICD-10-CM | POA: Diagnosis not present

## 2024-07-19 DIAGNOSIS — I2699 Other pulmonary embolism without acute cor pulmonale: Secondary | ICD-10-CM | POA: Diagnosis not present

## 2024-07-19 DIAGNOSIS — I4891 Unspecified atrial fibrillation: Secondary | ICD-10-CM | POA: Diagnosis not present

## 2024-07-19 LAB — COMPREHENSIVE METABOLIC PANEL WITH GFR
ALT: 10 U/L (ref 0–44)
AST: 23 U/L (ref 15–41)
Albumin: 2.3 g/dL — ABNORMAL LOW (ref 3.5–5.0)
Alkaline Phosphatase: 63 U/L (ref 38–126)
Anion gap: 9 (ref 5–15)
BUN: 8 mg/dL (ref 8–23)
CO2: 22 mmol/L (ref 22–32)
Calcium: 8.6 mg/dL — ABNORMAL LOW (ref 8.9–10.3)
Chloride: 105 mmol/L (ref 98–111)
Creatinine, Ser: 0.71 mg/dL (ref 0.44–1.00)
GFR, Estimated: >60 mL/min
Glucose, Bld: 133 mg/dL — ABNORMAL HIGH (ref 70–99)
Potassium: 4 mmol/L (ref 3.5–5.1)
Sodium: 136 mmol/L (ref 135–145)
Total Bilirubin: 0.2 mg/dL (ref 0.0–1.2)
Total Protein: 6.5 g/dL (ref 6.5–8.1)

## 2024-07-19 LAB — GLUCOSE, CAPILLARY
Glucose-Capillary: 157 mg/dL — ABNORMAL HIGH (ref 70–99)
Glucose-Capillary: 133 mg/dL — ABNORMAL HIGH (ref 70–99)
Glucose-Capillary: 110 mg/dL — ABNORMAL HIGH (ref 70–99)
Glucose-Capillary: 109 mg/dL — ABNORMAL HIGH (ref 70–99)

## 2024-07-19 LAB — CBC
HCT: 32.2 % — ABNORMAL LOW (ref 36.0–46.0)
Hemoglobin: 10.7 g/dL — ABNORMAL LOW (ref 12.0–15.0)
MCH: 25.8 pg — ABNORMAL LOW (ref 26.0–34.0)
MCHC: 33.2 g/dL (ref 30.0–36.0)
MCV: 77.8 fL — ABNORMAL LOW (ref 80.0–100.0)
Platelets: 224 K/uL (ref 150–400)
RBC: 4.14 MIL/uL (ref 3.87–5.11)
RDW: 18.8 % — ABNORMAL HIGH (ref 11.5–15.5)
WBC: 9.5 K/uL (ref 4.0–10.5)
nRBC: 0 % (ref 0.0–0.2)

## 2024-07-19 NOTE — TOC Progression Note (Addendum)
 Transition of Care [facility]) - Progression Note    Patient Details  Name: Rhonda Hickman MRN: [id number] Date of Birth: September 30, 1955  Transition of Care [facility]) CM/SW Contact  Luise JAYSON Pan, [place] Phone Number: 07/19/2024, 8:14 AM  Clinical Narrative:   Insurance is offering a peer to peer due by today at 101 PM. MD will need to say pts name, DOB, and member ID. Will need to call [phone] opt [phone]. CSW notified MD.   1:24 PM Insurance called CSW to inform that peer to peer was not completed and insurance denied at this time. CSW spoke with patients sister, Dickey, to inform of insurance denial. CSW informed Dickey that an appeal of insurances decision can be done or CSW can inquire if facility will take patient directly under LTC as the facility stated they can accommodate for that. Dickey asked if CSW can inquire if patient can dc under long term care first.   1:41 PM Per facility, patient unable to admit as long term care as patients medicaid is not long term care medicaid and patient has no payor source.   2:10 PM Briana with [facility] admissions informed CSW that patient can in fact dc to [facility] for long term care once facilities business offices runs an assest check and the check is clear. Braina informed CSW that patient will have to reside at facility for 90 days for ltc medicaid to go into effect and cover stray. CSW clarified with Ashley about if family would have to come out of pocket. Ashley stated as long as patient remains at the facility for 90 consecutive days, family will not have to come out of pocket. Ashley stated she will inform CSW of when the business office has completed asset check and if facility can official accept patient for long term care.   4:31 PM CSW sent secure, HIPAA compliant email to [facility] about above information.   4:45 PM CSW submitted fast appeal with insurance. Fast appeal has a 72 hour processing time. CSW submitting appeal as backup plan.  CSW will  continue to follow.    Expected Discharge Plan: Skilled Nursing Facility Barriers to Discharge: Continued Medical Work up, SNF Pending bed offer, English As A Second Language Teacher               Expected Discharge Plan and Services In-house Referral: Clinical Social Work, Radio Producer Services: CM Consult   Living arrangements for the past 2 months: Apartment                                       Social Drivers of Health (SDOH) Interventions SDOH Screenings   Food Insecurity: No Food Insecurity (07/14/2024)  Housing: Low Risk (07/14/2024)  Transportation Needs: No Transportation Needs (07/14/2024)  Utilities: Not At Risk (07/14/2024)  Depression (PHQ2-9): Low Risk (03/29/2024)  Social Connections: Moderately Isolated (07/14/2024)  Tobacco Use: High Risk (07/13/2024)    Readmission Risk Interventions     No data to display

## 2024-07-19 NOTE — Progress Notes (Signed)
 " Progress Note   Patient: Rhonda Hickman FMW:[id number] DOB: 1955-10-24 DOA: 07/13/2024     6 DOS: the patient was seen and examined on 07/19/2024   Brief hospital course: 69 year old female with history of chronic diastolic CHF, PAF on Xarelto , bipolar disorder, GERD, status post right knee replacement October 2025, currently residing in an SNF who comes into the hospital with chest pain as well as increased confusion. She was found to have a small PE and acute DVT, was also found to be in A-fib with RVR. She was placed on amiodarone  infusion, on heparin  infusion and admitted to the hospital. Chest imaging was also concerning for pneumonia and she was placed on antibiotics with ceftriaxone  and doxycycline .   Assessment and Plan: Principal problem Pulmonary embolism, acute DVT -patient has been on Xarelto , CT angiogram on admission showed a right lower lobe segmental pulmonary embolism without right heart strain.  Lower extremity ultrasound reveals a left lower extremity DVT that appears to be acute.  This might represent Xarelto  failure - She was placed on heparin , Dr. Trixie spoke with Dr. Lonn, given recent hip surgery, immobility, SNF, DVTs can still happen would not necessarily be considered a full failure, however she does recommend switching to Eliquis  rather than Coumadin given higher Coumadin failure rates than NOACs. - Now on Eliquis  -Pending placement   Active problems Acute metabolic encephalopathy on underlying mild cognitive impairment-Dr. Trixie discussed at bedside with the patient's sister and niece, she has been having a gradual decline over the last year or so, more so after her hip and knee surgeries in the last few months.  They feel like she is significantly declining at this time around and is different than her baseline.  She was normally able to carry a conversation, but occasionally she would have a bad day - MRI brain reviewed. Neg   Community-acquired pneumonia-she did  complain of a cough on admission, CT scan concerning for right lower lobe infection/inflammation.  -Completed course of abx   Paroxysmal A-fib, with RVR-patient was on amiodarone  infusion, rates are better, she was started on metoprolol    DM 2, with hyperglycemia-A1c 7.5.  Has been placed on sliding scale, continue   History of COPD-no wheezing, this is stable, on minimal O2 support   Troponin elevation-flat, not in a pattern consistent with ACS, this is likely due to her PE and potential pneumonia   Acute kidney injury-in the setting of acute illness, most recent creatinine in November was 0.8, on admission was 1.3.  Continue to monitor.  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  Creatinine back to baseline   Hypomagnesemia-replenish magnesium  as needed   Chronic diastolic CHF-last echo from 2024 shows LVEF 55 to 60%, grade 1 DD.  Currently appears euvolemic   Obesity, class II-BMI 37, she would benefit from weight loss      Subjective: Without complaints. Difficult to assess given mentation  Physical Exam: Vitals:   07/19/24 0021 07/19/24 0355 07/19/24 0723 07/19/24 1100  BP: 134/74 127/63 137/67 117/82  Pulse: (!) 105 95  71  Resp: 13 20 12 15   Temp: 98.3 F (36.8 C) 97.6 F (36.4 C) 97.9 F (36.6 C) 97.7 F (36.5 C)  TempSrc: Axillary Oral Oral Oral  SpO2: 100% 98% 100% 100%  Weight:      Height:       General exam: Awake, laying in bed, in nad  Respiratory system: Normal respiratory effort, no wheezing Cardiovascular system: regular rate, s1, s2 Gastrointestinal system: Soft, nondistended, positive BS Central nervous system: CN2-12 grossly intact, strength intact Extremities: Perfused, no clubbing Skin: Normal skin turgor, no notable skin lesions seen Psychiatry: Difficult to assess given mentation  Data Reviewed:  Labs reviewed: Na 136, K 4.0, Cr 0.71, Alk phos 63  Family  Communication: Pt in room, family is at bedside  Disposition: Status is: Inpatient Remains inpatient appropriate because: severity of illness  Planned Discharge Destination: Skilled nursing facility    Author: Garnette Pelt, MD 07/19/2024 3:49 PM  For on call review [email or web address]  "

## 2024-07-19 NOTE — Progress Notes (Signed)
 Occupational Therapy Treatment Patient Details Name: Rhonda Hickman MRN: [id number] DOB: [birth year] Today's Date: 07/19/2024   History of present illness 69 y.o. F adm 1/3 with chest pain and confusion.  Found to have L DVT and PE on workup. 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   OT comments  Provided maximum encouragement in attempt to elicit participation in bed level mobility and sit EOB with pt yelling out. No initiation noted. Rolled with +2 total assist and repositioned for comfort with heels floating on pillow. Pt washed her face with set up and decreased thoroughness. Able to drink from cup with straw when placed in her hand. Patient will benefit from continued inpatient follow up therapy, <3 hours/day. Will continue to follow.       If plan is discharge home, recommend the following:  Two people to help with walking and/or transfers;Two people to help with bathing/dressing/bathroom;Assistance with cooking/housework;Assistance with feeding;Direct supervision/assist for medications management;Direct supervision/assist for financial management;Assist for transportation;Help with stairs or ramp for entrance;Supervision due to cognitive status   Equipment Recommendations  Hospital bed;Hoyer lift;Wheelchair (measurements OT);Wheelchair cushion (measurements OT)    Recommendations for Other Services      Precautions / Restrictions Precautions Precautions: Fall Recall of Precautions/Restrictions: Impaired Precaution/Restrictions Comments: watch HR Restrictions Weight Bearing Restrictions Per Provider Order: No       Mobility Bed Mobility Overal bed mobility: Needs Assistance Bed Mobility: Rolling Rolling: Total assist, +2 for physical assistance, +2 for safety/equipment         General bed mobility comments: no initiation  toward EOB with attempt to assist pt supine to sit, pt yelling out stating she hurt all over, maximum encouragement to sit up and participate in bathing and dressing, +2 total assist to pull up and reposition pt in bed with heels floating    Transfers                         Balance                                           ADL either performed or assessed with clinical judgement   ADL Overall ADL's : Needs assistance/impaired Eating/Feeding: Set up;Sitting Eating/Feeding Details (indicate cue type and reason): drinks from cup with straw when placed in her hand Grooming: Set up;Bed level Grooming Details (indicate cue type and reason): washed face with decreased thoroughness             Lower Body Dressing: Total assistance;Bed level Lower Body Dressing Details (indicate cue type and reason): socks                    Extremity/Trunk Assessment              Vision       Perception     Praxis     Communication Communication Communication: Impaired Factors Affecting Communication: Difficulty expressing self   Cognition Arousal: Alert Behavior During Therapy: Flat affect               OT - Cognition Comments: pt with minimal initiation                 Following commands: Impaired Following commands  impaired: Follows one step commands inconsistently      Cueing   Cueing Techniques: Verbal cues, Tactile cues, Visual cues  Exercises      Shoulder Instructions       General Comments      Pertinent Vitals/ Pain       Pain Assessment Pain Assessment: Faces Faces Pain Scale: Hurts even more Pain Location: R LE and all over Pain Descriptors / Indicators: Grimacing, Guarding, Moaning Pain Intervention(s): Repositioned  Home Living                                          Prior Functioning/Environment              Frequency  Min 1X/week        Progress Toward Goals  OT  Goals(current goals can now be found in the care plan section)  Progress towards OT goals: Not progressing toward goals - comment  Acute Rehab OT Goals OT Goal Formulation: With patient Time For Goal Achievement: 07/30/24 Potential to Achieve Goals: Fair  Plan      Co-evaluation                 AM-PAC OT 6 Clicks Daily Activity     Outcome Measure   Help from another person eating meals?: A Lot Help from another person taking care of personal grooming?: A Lot Help from another person toileting, which includes using toliet, bedpan, or urinal?: Total Help from another person bathing (including washing, rinsing, drying)?: Total Help from another person to put on and taking off regular upper body clothing?: Total Help from another person to put on and taking off regular lower body clothing?: Total 6 Click Score: 8    End of Session    OT Visit Diagnosis: Muscle weakness (generalized) (M62.81);Other symptoms and signs involving cognitive function;Pain   Activity Tolerance Treatment limited secondary to agitation   Patient Left in bed;with call bell/phone within reach;with bed alarm set;with family/visitor present   Nurse Communication          Time: 9068-9044 OT Time Calculation (min): 24 min  Charges: OT General Charges $OT Visit: 1 Visit OT Treatments $Self Care/Home Management : 8-22 mins  [name], OTR/L Acute Rehabilitation Services Office: [phone]   Kennth Mliss Helling 07/19/2024, 11:16 AM

## 2024-07-19 NOTE — Progress Notes (Signed)
 Physical Therapy Treatment Patient Details Name: Rhonda Hickman MRN: [id number] DOB: [birth year] Today's Date: 07/19/2024   History of Present Illness 69 y.o. F adm 1/3 with chest pain and confusion.  Found to have L DVT and PE on workup. 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    PT Comments  Pt supine in bed on arrival. Seen in conjunction with OT to maximize pt activity tolerance and for attempted increase in pt participation. Pt with poor initiation for bed level mobility, yelling out with hands on assist for sequencing and attempts at coming to EOB despite max encouragement, reassurance and increased time given. Pt requiring total A +2 for rolling and to reposition in bed with pt heels floated and trunk at midline. Pt able to perform limited LE exercises due to pain and decreased ROM, pt unable to tolerate hands on assist for increased ROM. Patient will benefit from continued inpatient follow up therapy, <3 hours/day, will continue to follow acutely.   If plan is discharge home, recommend the following: Two people to help with bathing/dressing/bathroom;Two people to help with walking and/or transfers;Supervision due to cognitive status;Help with stairs or ramp for entrance;Assistance with feeding   Can travel by private vehicle     No  Equipment Recommendations  None recommended by PT    Recommendations for Other Services       Precautions / Restrictions Precautions Precautions: Fall Recall of Precautions/Restrictions: Impaired Precaution/Restrictions Comments: watch HR Restrictions Weight Bearing Restrictions Per Provider Order: No     Mobility  Bed Mobility Overal bed mobility: Needs Assistance Bed Mobility: Rolling Rolling: Total assist, +2 for physical assistance, +2 for safety/equipment         General bed mobility  comments: no initiation toward EOB with attempt to assist pt supine to sit, pt yelling out stating she hurt all over, maximum encouragement to sit up and participate in bathing and dressing, +2 total assist to pull up and reposition pt in bed with heels floating    Transfers                        Ambulation/Gait                   Stairs             Wheelchair Mobility     Tilt Bed    Modified Rankin (Stroke Patients Only)       Balance                                            Communication Communication Communication: Impaired Factors Affecting Communication: Difficulty expressing self  Cognition Arousal: Alert Behavior During Therapy: Flat affect                             Following commands: Impaired Following commands impaired: Follows one step commands inconsistently    Cueing Cueing Techniques: Verbal cues, Tactile cues, Visual cues  Exercises General Exercises - Lower Extremity Ankle Circles/Pumps: AROM, Both, 10 reps, Supine, Limitations Ankle Circles/Pumps Limitations: limited ROM vs participation    General Comments General comments (skin integrity, edema, etc.): VSS      Pertinent  Vitals/Pain Pain Assessment Pain Assessment: Faces Faces Pain Scale: Hurts even more Pain Location: R LE and all over Pain Descriptors / Indicators: Grimacing, Guarding, Moaning Pain Intervention(s): Monitored during session, Limited activity within patient's tolerance, Repositioned    Home Living                          Prior Function            PT Goals (current goals can now be found in the care plan section) Acute Rehab PT Goals PT Goal Formulation: With patient/family Time For Goal Achievement: 07/29/24 Progress towards PT goals: Not progressing toward goals - comment (limited participation and pain)    Frequency    Min 1X/week      PT Plan      Co-evaluation PT/OT/SLP  Co-Evaluation/Treatment: Yes Reason for Co-Treatment: Complexity of the patient's impairments (multi-system involvement);Necessary to address cognition/behavior during functional activity PT goals addressed during session: Mobility/safety with mobility;Strengthening/ROM        AM-PAC PT 6 Clicks Mobility   Outcome Measure  Help needed turning from your back to your side while in a flat bed without using bedrails?: Total Help needed moving from lying on your back to sitting on the side of a flat bed without using bedrails?: Total Help needed moving to and from a bed to a chair (including a wheelchair)?: Total Help needed standing up from a chair using your arms (e.g., wheelchair or bedside chair)?: Total Help needed to walk in hospital room?: Total Help needed climbing 3-5 steps with a railing? : Total 6 Click Score: 6    End of Session   Activity Tolerance: Patient limited by fatigue;Other (comment) (self limiting) Patient left: in bed;with call bell/phone within reach;with bed alarm set Nurse Communication: Mobility status PT Visit Diagnosis: Other abnormalities of gait and mobility (R26.89);Muscle weakness (generalized) (M62.81);Other symptoms and signs involving the nervous system (R29.898);Pain Pain - Right/Left: Right Pain - part of body: Knee     Time: 9068-9044 PT Time Calculation (min) (ACUTE ONLY): 24 min  Charges:    $Therapeutic Activity: 8-22 mins PT General Charges $$ ACUTE PT VISIT: 1 Visit                     [name] R. PTA Acute Rehabilitation Services Office: [phone]   [name] 07/19/2024, 2:06 PM

## 2024-07-19 NOTE — Plan of Care (Signed)
 ?  Problem: Clinical Measurements: ?Goal: Will remain free from infection ?Outcome: Progressing ?  ?

## 2024-07-20 DIAGNOSIS — I4891 Unspecified atrial fibrillation: Secondary | ICD-10-CM | POA: Diagnosis not present

## 2024-07-20 DIAGNOSIS — N390 Urinary tract infection, site not specified: Secondary | ICD-10-CM | POA: Diagnosis not present

## 2024-07-20 DIAGNOSIS — I2699 Other pulmonary embolism without acute cor pulmonale: Secondary | ICD-10-CM | POA: Diagnosis not present

## 2024-07-20 LAB — GLUCOSE, CAPILLARY
Glucose-Capillary: 110 mg/dL — ABNORMAL HIGH (ref 70–99)
Glucose-Capillary: 99 mg/dL (ref 70–99)
Glucose-Capillary: 93 mg/dL (ref 70–99)
Glucose-Capillary: 115 mg/dL — ABNORMAL HIGH (ref 70–99)

## 2024-07-20 NOTE — Progress Notes (Signed)
 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 She was found to have a small PE and acute DVT, was also found to be in A-fib with RVR. She was placed on amiodarone  infusion, on heparin  infusion and admitted to the hospital. Chest imaging was also concerning for pneumonia and she was placed on antibiotics with ceftriaxone  and doxycycline .   Assessment and Plan: Principal problem Pulmonary embolism, acute DVT -patient has been on Xarelto , CT angiogram on admission showed a right lower lobe segmental pulmonary embolism without right heart strain.  Lower extremity ultrasound reveals a left lower extremity DVT that appears to be acute.  This might represent Xarelto  failure - She was placed on heparin , Dr. Trixie spoke with Dr. Lonn, given recent hip surgery, immobility, SNF, DVTs can still happen would not necessarily be considered a full failure, however she does recommend switching to Eliquis  rather than Coumadin given higher Coumadin failure rates than NOACs. - Continue on Eliquis  -Pending placement   Active problems Acute metabolic encephalopathy on underlying mild cognitive impairment-Dr. Trixie discussed at bedside with the patient's sister and niece, she has been having a gradual decline over the last year or so, more so after her hip and knee surgeries in the last few months.  They feel like she is significantly declining at this time around and is different than her baseline.  She was normally able to carry a conversation, but occasionally she would have a bad day - MRI brain reviewed. Neg   Community-acquired  pneumonia-she did complain of a cough on admission, CT scan concerning for right lower lobe infection/inflammation.  -Completed course of abx   Paroxysmal A-fib, with RVR-patient was on amiodarone  infusion, rates are better, she was started on metoprolol    DM 2, with hyperglycemia-A1c 7.5.  Has been placed on sliding scale, continue   History of COPD-no wheezing, this is stable, on minimal O2 support   Troponin elevation-flat, not in a pattern consistent with ACS, this is likely due to her PE and potential pneumonia   Acute kidney injury-in the setting of acute illness, most recent creatinine in November was 0.8, on admission was 1.3.  Continue to monitor.  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  Creatinine back to baseline   Hypomagnesemia-replenish magnesium  as needed   Chronic diastolic CHF-last echo from 2024 shows LVEF 55 to 60%, grade 1 DD.  Currently appears euvolemic   Obesity, class II-BMI 37, she would benefit from weight loss      Subjective: Wanting more vanilla ice cream this AM  Physical Exam: Vitals:   07/20/24 0400 07/20/24 0800 07/20/24 1200 07/20/24 1545  BP: (!) 142/85 99/67 (!) 107/55 100/68  Pulse: 82 75 68 69  Resp: 19 16 10 16   Temp: 98 F (36.7 C) 98 F (36.7 C) 97.9 F (36.6 C) 97.8 F (36.6 C)  TempSrc: Oral Oral Oral Oral  SpO2: 99% 97% 99% 95%  Weight:      Height:       General exam: Conversant, in no acute distress  Respiratory system: normal chest rise, clear, no audible wheezing Cardiovascular system: regular rhythm, s1-s2 Gastrointestinal system: Nondistended, nontender, pos BS Central nervous system: No seizures, no tremors Extremities: No cyanosis, no joint deformities Skin: No rashes, no pallor Psychiatry: Affect normal // mood seems normal Data Reviewed:  Labs reviewed: Na 136, K 4.0, Cr 0.71, WBC 9.5, Hgb 10.7, Plts 224  Family Communication: Pt  in room, family not at bedside  Disposition: Status is: Inpatient Remains inpatient appropriate because: severity of illness  Planned Discharge Destination: Skilled nursing facility    Author: Garnette Pelt, MD 07/20/2024 4:31 PM  For on call review [email or web address]  "

## 2024-07-20 NOTE — Progress Notes (Addendum)
 Pt is alert, oriented x 2-3, cooperative, afebrile, on room air, normal respiratory effort, stable hemodynamically, Atrial fib/ flutter,heart rate under controlled on the monitor. No acute distress note. Pt is able to rest and sleep well overnight with no major complaints. Plan of care is reviewed. We will continue to monitor.   Wendi Brownie, RN

## 2024-07-21 DIAGNOSIS — I2699 Other pulmonary embolism without acute cor pulmonale: Secondary | ICD-10-CM | POA: Diagnosis not present

## 2024-07-21 DIAGNOSIS — N390 Urinary tract infection, site not specified: Secondary | ICD-10-CM | POA: Diagnosis not present

## 2024-07-21 DIAGNOSIS — I4891 Unspecified atrial fibrillation: Secondary | ICD-10-CM | POA: Diagnosis not present

## 2024-07-21 LAB — GLUCOSE, CAPILLARY
Glucose-Capillary: 99 mg/dL (ref 70–99)
Glucose-Capillary: 94 mg/dL (ref 70–99)
Glucose-Capillary: 137 mg/dL — ABNORMAL HIGH (ref 70–99)
Glucose-Capillary: 93 mg/dL (ref 70–99)

## 2024-07-21 NOTE — Progress Notes (Signed)
 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 She was found to have a small PE and acute DVT, was also found to be in A-fib with RVR. She was placed on amiodarone  infusion, on heparin  infusion and admitted to the hospital. Chest imaging was also concerning for pneumonia and she was placed on antibiotics with ceftriaxone  and doxycycline .   Assessment and Plan: Principal problem Pulmonary embolism, acute DVT -patient has been on Xarelto , CT angiogram on admission showed a right lower lobe segmental pulmonary embolism without right heart strain.  Lower extremity ultrasound reveals a left lower extremity DVT that appears to be acute.  This might represent Xarelto  failure - She was placed on heparin , Dr. Trixie spoke with Dr. Lonn, given recent hip surgery, immobility, SNF, DVTs can still happen would not necessarily be considered a full failure, however she does recommend switching to Eliquis  rather than Coumadin given higher Coumadin failure rates than NOACs. - Continue on Eliquis  -Insurance had denied SNF stay. Family appeal currently in progress   Active problems Acute metabolic encephalopathy on underlying mild cognitive impairment-Dr. Trixie discussed at bedside with the patient's sister and niece, she has been having a gradual decline over the last year or so, more so after her hip and knee surgeries in the last few months.  They feel like she is significantly declining at this time around and is different than her baseline.  She was normally able to carry a conversation, but occasionally she would have a bad day - MRI brain  reviewed. Neg   Community-acquired pneumonia-she did complain of a cough on admission, CT scan concerning for right lower lobe infection/inflammation.  -Completed course of abx   Paroxysmal A-fib, with RVR-patient was on amiodarone  infusion, rates are better, she was started on metoprolol    DM 2, with hyperglycemia-A1c 7.5.  Has been placed on sliding scale, continue   History of COPD-no wheezing, this is stable, on minimal O2 support   Troponin elevation-flat, not in a pattern consistent with ACS, this is likely due to her PE and potential pneumonia   Acute kidney injury-in the setting of acute illness, most recent creatinine in November was 0.8, on admission was 1.3.  Continue to monitor.  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  Creatinine back to baseline   Hypomagnesemia-replenish magnesium  as needed   Chronic diastolic CHF-last echo from 2024 shows LVEF 55 to 60%, grade 1 DD.  Currently appears euvolemic   Obesity, class II-BMI 37, she would benefit from weight loss      Subjective: No complaints this AM  Physical Exam: Vitals:   07/20/24 2350 07/21/24 0354 07/21/24 1140 07/21/24 1540  BP: 108/69 102/70 117/80 107/66  Pulse: 74 70 66   Resp: 15 10 20 18   Temp: 97.7 F (36.5 C) 97.7 F (36.5 C)  97.8 F (36.6 C)  TempSrc: Oral Oral  Oral  SpO2: 99% 96% 96% 99%  Weight:      Height:       General exam: Awake, laying in bed, in  nad Respiratory system: Normal respiratory effort, no wheezing Cardiovascular system: regular rate, s1, s2 Gastrointestinal system: Soft, nondistended, positive BS Central nervous system: CN2-12 grossly intact, strength intact Extremities: Perfused, no clubbing Skin: Normal skin turgor, no notable skin lesions seen Psychiatry: Mood normal // affect seems normal  Data Reviewed:  There are no new results to review at this time.  Family Communication: Pt in  room, family currently at bedside  Disposition: Status is: Inpatient Remains inpatient appropriate because: severity of illness  Planned Discharge Destination: Skilled nursing facility    Author: Garnette Pelt, MD 07/21/2024 5:30 PM  For on call review [email or web address]  "

## 2024-07-21 NOTE — Plan of Care (Signed)
  Problem: Skin Integrity: Goal: Risk for impaired skin integrity will decrease Outcome: Progressing   Problem: Nutrition: Goal: Adequate nutrition will be maintained Outcome: Progressing   

## 2024-07-22 ENCOUNTER — Encounter: Admitting: Physician Assistant

## 2024-07-22 DIAGNOSIS — N390 Urinary tract infection, site not specified: Secondary | ICD-10-CM | POA: Diagnosis not present

## 2024-07-22 DIAGNOSIS — I2699 Other pulmonary embolism without acute cor pulmonale: Secondary | ICD-10-CM | POA: Diagnosis not present

## 2024-07-22 LAB — CBC
HCT: 34 % — ABNORMAL LOW (ref 36.0–46.0)
Hemoglobin: 11.1 g/dL — ABNORMAL LOW (ref 12.0–15.0)
MCH: 25.9 pg — ABNORMAL LOW (ref 26.0–34.0)
MCHC: 32.6 g/dL (ref 30.0–36.0)
MCV: 79.4 fL — ABNORMAL LOW (ref 80.0–100.0)
Platelets: 221 K/uL (ref 150–400)
RBC: 4.28 MIL/uL (ref 3.87–5.11)
RDW: 19.4 % — ABNORMAL HIGH (ref 11.5–15.5)
WBC: 8.3 K/uL (ref 4.0–10.5)
nRBC: 0 % (ref 0.0–0.2)

## 2024-07-22 LAB — COMPREHENSIVE METABOLIC PANEL WITH GFR
ALT: 9 U/L (ref 0–44)
AST: 21 U/L (ref 15–41)
Albumin: 2.3 g/dL — ABNORMAL LOW (ref 3.5–5.0)
Alkaline Phosphatase: 62 U/L (ref 38–126)
Anion gap: 9 (ref 5–15)
BUN: 7 mg/dL — ABNORMAL LOW (ref 8–23)
CO2: 24 mmol/L (ref 22–32)
Calcium: 8.6 mg/dL — ABNORMAL LOW (ref 8.9–10.3)
Chloride: 102 mmol/L (ref 98–111)
Creatinine, Ser: 0.65 mg/dL (ref 0.44–1.00)
GFR, Estimated: >60 mL/min
Glucose, Bld: 115 mg/dL — ABNORMAL HIGH (ref 70–99)
Potassium: 3.9 mmol/L (ref 3.5–5.1)
Sodium: 135 mmol/L (ref 135–145)
Total Bilirubin: <0.2 mg/dL (ref 0.0–1.2)
Total Protein: 6.2 g/dL — ABNORMAL LOW (ref 6.5–8.1)

## 2024-07-22 LAB — GLUCOSE, CAPILLARY
Glucose-Capillary: 101 mg/dL — ABNORMAL HIGH (ref 70–99)
Glucose-Capillary: 102 mg/dL — ABNORMAL HIGH (ref 70–99)
Glucose-Capillary: 91 mg/dL (ref 70–99)
Glucose-Capillary: 124 mg/dL — ABNORMAL HIGH (ref 70–99)

## 2024-07-22 MED ORDER — HYDROXYZINE HCL 25 MG PO TABS
25.0000 mg | ORAL_TABLET | Freq: Three times a day (TID) | ORAL | Status: DC | PRN
  Administered 2024-07-22: 25 mg via ORAL
  Filled 2024-07-22: qty 1

## 2024-07-22 NOTE — Consult Note (Signed)
 WOC Nurse wound follow up Wound type: pressure injuries DTPI; left heel; 5.5cm x 6.6cm x 0cm; intact; dark purple non blanchable tissue DTPI: right heel; 3cm x 4cm x 0cm area affected with some dark purple that does not blanch; appears to be improving  Sacrum/buttocks; Stage 3; area has began to re-epitelize in spots; generally the wound is aprox. 7cm x 6cm x 0.1cm  Right ankle; anterior; Deep Tissue Pressure Injury Left ankle; anterior; Deep Tissue Pressure Injury  Measurement:see above  Wound bed:see above Drainage (amount, consistency, odor) none Periwound:intact  Dressing procedure/placement/frequency:  Cleanse sacral/medial buttocks wounds with Vashe, do not rinse.  Apply Xeroform gauze ([place] [phone]) to wound bed daily and secure with silicone foam.  Lift foam daily to replace Xeroform, change q3 days and prn soiling.  Cleanse B heels with soap and water , dry and apply silicone heel protectors. Place B feet into Prevalon boots to offload pressure Gerlean [phone].  Today when I assessed patient the Prevalon boots are in the chair, I have placed back on the patient making sure that the straps do not touch the patients ankle (anterior surface), two new areas noted; appear to be Deep Tissue Pressure Injuries; potentially device related   WOC Nurse team will follow with you and see patient within 10 days for wound assessments.  Please notify WOC nurses of any acute changes in the wounds or any new areas of concern Nami [facility] MSN, RN,CWOCN, CNS, CWON-AP [phone]

## 2024-07-22 NOTE — Plan of Care (Signed)

## 2024-07-22 NOTE — TOC Progression Note (Addendum)
 Transition of Care [facility]) - Progression Note    Patient Details  Name: Rhonda Hickman MRN: [id number] Date of Birth: [birth year]  [facility]) CM/SW Contact  Luise JAYSON Pan, [place] Phone Number: 07/22/2024, 1:08 PM  Clinical Narrative:   Concepcion Huron AD informed CSW that facility has rescinded bed offer at this time.   CSW received call from [facility] representative that patients appeal has been approved/overturned for SNF.  CSW reached out to [facility] facilities liaison about bed availability at other [facility] facilities. CSW expanded SNF search.  1:28 PM CSW left VM for patient sister, Dickey.  1:59 PM Appeal overturn approval dates: 07/22/2024-07/26/2024. CSW called Dickey to inform that [facility] rescinded bed offer. CSW informed Dickey that [facility] is able to accept patient. Dickey inquired about [facility]. CSW informed Dickey that [facility] is the only [employer] offer. CSW will resend bed offers for STR with their medicare.gov ratings.   4:25 PM Dickey called CSW to inform that she would like patient to go to [facility] at this time. CSW followed up with facility. Per facility, they can accept patient for STR. CSW to updated insurance auth to reflect [facility] Facility can accept patient tomorrow.  4:30 PM CSW updated insurance auth to be for [facility] SNF. Case number [id number].  CSW will continue to follow.    Expected Discharge Plan: Skilled Nursing Facility Barriers to Discharge: Continued Medical Work up, SNF Pending bed offer, English As A Second Language Teacher               Expected Discharge Plan and Services In-house Referral: Clinical Social Work, Radio Producer Services: CM Consult   Living arrangements for the past 2 months: Apartment                                       Social Drivers of Health (SDOH) Interventions SDOH Screenings   Food Insecurity: No Food Insecurity (07/14/2024)  Housing: Low Risk (07/14/2024)   Transportation Needs: No Transportation Needs (07/14/2024)  Utilities: Not At Risk (07/14/2024)  Depression (PHQ2-9): Low Risk (03/29/2024)  Social Connections: Moderately Isolated (07/14/2024)  Tobacco Use: High Risk (07/13/2024)    Readmission Risk Interventions     No data to display

## 2024-07-22 NOTE — Plan of Care (Signed)
" °  Problem: Skin Integrity: Goal: Risk for impaired skin integrity will decrease 07/22/2024 0421 by Young Jacobsen, RN Outcome: Progressing 07/22/2024 0421 by Young Jacobsen, RN Outcome: Progressing   "

## 2024-07-22 NOTE — Progress Notes (Signed)
 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 She was found to have a small PE and acute DVT, was also found to be in A-fib with RVR. She was placed on amiodarone  infusion, on heparin  infusion and admitted to the hospital. Chest imaging was also concerning for pneumonia and she was placed on antibiotics with ceftriaxone  and doxycycline .   Assessment and Plan: Principal problem Pulmonary embolism, acute DVT -patient has been on Xarelto , CT angiogram on admission showed a right lower lobe segmental pulmonary embolism without right heart strain.  Lower extremity ultrasound reveals a left lower extremity DVT that appears to be acute.  This might represent Xarelto  failure - She was placed on heparin , Dr. Trixie spoke with Dr. Lonn, given recent hip surgery, immobility, SNF, DVTs can still happen would not necessarily be considered a full failure, however she does recommend switching to Eliquis  rather than Coumadin given higher Coumadin failure rates than NOACs. - Continue on Eliquis  -Insurance had denied SNF stay. Appeal currently in progress on behalf of family   Active problems Acute metabolic encephalopathy on underlying mild cognitive impairment-Dr. Trixie discussed at bedside with the patient's sister and niece, she has been having a gradual decline over the last year or so, more so after her hip and knee surgeries in the last few months.  They feel like she is significantly declining at this time around and is different than her baseline.  She was normally able to carry a conversation, but occasionally she would have a bad  day - MRI brain reviewed. Neg   Community-acquired pneumonia-she did complain of a cough on admission, CT scan concerning for right lower lobe infection/inflammation.  -Completed course of abx   Paroxysmal A-fib, with RVR-patient was on amiodarone  infusion, rates are better, she was started on metoprolol    DM 2, with hyperglycemia-A1c 7.5.  Has been placed on sliding scale, continue   History of COPD-no wheezing, this is stable, on minimal O2 support   Troponin elevation-flat, not in a pattern consistent with ACS, this is likely due to her PE and potential pneumonia   Acute kidney injury-in the setting of acute illness, most recent creatinine in November was 0.8, on admission was 1.3.  Continue to monitor.  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  Creatinine back to baseline   Hypomagnesemia-replenish magnesium  as needed   Chronic diastolic CHF-last echo from 2024 shows LVEF 55 to 60%, grade 1 DD.  Currently appears euvolemic   Obesity, class II-BMI 37, she would benefit from weight loss      Subjective: Without complaints this AM  Physical Exam: Vitals:   07/22/24 0032 07/22/24 0321 07/22/24 0840 07/22/24 1140  BP: 113/81 124/77 129/85 99/76  Pulse: 66 67  77  Resp: 18 18 16 11   Temp: 97.7 F (36.5 C) 98.4 F (36.9 C) 97.7 F (36.5 C) 97.7 F (36.5 C)  TempSrc: Oral Oral Oral Oral  SpO2: 100% 94% 96% 100%  Weight:      Height:       General  exam: Conversant, in no acute distress Respiratory system: normal chest rise, clear, no audible wheezing Cardiovascular system: regular rhythm, s1-s2 Gastrointestinal system: Nondistended, nontender, pos BS Central nervous system: No seizures, no tremors Extremities: No cyanosis, no joint deformities Skin: No rashes, no pallor Psychiatry: Affect normal // no auditory hallucinations   Data Reviewed:  There are no new results to review at this  time.  Family Communication: Pt in room, family currently at bedside  Disposition: Status is: Inpatient Remains inpatient appropriate because: severity of illness  Planned Discharge Destination: Skilled nursing facility    Author: Garnette Pelt, MD 07/22/2024 2:37 PM  For on call review [email or web address]  "

## 2024-07-22 NOTE — Progress Notes (Signed)
" °   07/22/24 1240  Spiritual Encounters  Type of Visit Declined chaplain visit  Care provided to: Patient  Reason for visit Advance directives  OnCall Visit No  Spiritual Framework  Presenting Themes Impactful experiences and emotions  Community/Connection Other (comment)  Patient Stress Factors Health changes  Family Stress Factors None identified  Interventions  Spiritual Care Interventions Made Compassionate presence;Established relationship of care and support  Mental Health Advance Directives  Would patient like information on creating a mental health advance directive? No - Patient declined   Chaplain attempted to provided education regarding Advance Directive (AD), however , the patient declined the chaplain visit at this time. Chaplain honored Pt's request, chaplain services remain available upon request. "

## 2024-07-23 DIAGNOSIS — I4891 Unspecified atrial fibrillation: Secondary | ICD-10-CM | POA: Diagnosis not present

## 2024-07-23 DIAGNOSIS — I2699 Other pulmonary embolism without acute cor pulmonale: Secondary | ICD-10-CM | POA: Diagnosis not present

## 2024-07-23 DIAGNOSIS — N390 Urinary tract infection, site not specified: Secondary | ICD-10-CM | POA: Diagnosis not present

## 2024-07-23 LAB — GLUCOSE, CAPILLARY
Glucose-Capillary: 103 mg/dL — ABNORMAL HIGH (ref 70–99)
Glucose-Capillary: 130 mg/dL — ABNORMAL HIGH (ref 70–99)

## 2024-07-23 MED ORDER — HYDROXYZINE HCL 25 MG PO TABS: 25.0000 mg | ORAL_TABLET | Freq: Three times a day (TID) | ORAL | Status: AC | PRN

## 2024-07-23 MED ORDER — APIXABAN 5 MG PO TABS: 5.0000 mg | ORAL_TABLET | Freq: Two times a day (BID) | ORAL | Status: AC

## 2024-07-23 MED ORDER — METOPROLOL TARTRATE 37.5 MG PO TABS: 37.5000 mg | ORAL_TABLET | Freq: Two times a day (BID) | ORAL | Status: AC

## 2024-07-23 MED ORDER — NOVOLOG FLEXPEN 100 UNIT/ML ~~LOC~~ SOPN: PEN_INJECTOR | SUBCUTANEOUS | Status: AC

## 2024-07-23 MED ORDER — NICOTINE 14 MG/24HR TD PT24: 14.0000 mg | MEDICATED_PATCH | Freq: Every day | TRANSDERMAL | Status: AC

## 2024-07-23 NOTE — TOC Transition Note (Signed)
 Transition of Care [facility]) - Discharge Note   Patient Details  Name: Rhonda Hickman MRN: [id number] Date of Birth: [birth year]  Transition of [facility]) CM/SW Contact:  Luise JAYSON Pan, LCSWA Phone Number: 07/23/2024, 10:53 AM   Clinical Narrative:   Patient will DC to: [facility] Anticipated DC date: 07/23/2024  Family notified: Dorothey Patron [phone] Transport by: [facility]   Per MD patient ready for DC to [facility]. RN to call report prior to discharge [phone]; room 238). RN, patient, patient's family, and facility notified of DC. Discharge Summary and FL2 sent to facility. DC packet on chart. Ambulance transport requested for patient 10:52 AM.   CSW will sign off for now as social work intervention is no longer needed. Please consult us  again if new needs arise.      Final next level of care: Skilled Nursing Facility Barriers to Discharge: Barriers Resolved   Patient Goals and CMS Choice Patient states their goals for this hospitalization and ongoing recovery are:: Unable to assess          Discharge Placement PASRR number recieved: 07/16/24            Patient chooses bed at: [facility] Patient to be transferred to facility by: [facility] Name of family member notified: Dorothey Patron  [phone] Patient and family notified of of transfer: 07/23/24  Discharge Plan and Services Additional resources added to the After Visit Summary for   In-house Referral: Clinical Social Work, Orthoptist Discharge Planning Services: CM Consult                                 Social Drivers of Health (SDOH) Interventions SDOH Screenings   Food Insecurity: No Food Insecurity (07/14/2024)  Housing: Low Risk (07/14/2024)  Transportation Needs: No Transportation Needs (07/14/2024)  Utilities: Not At Risk (07/14/2024)  Depression (PHQ2-9): Low Risk (03/29/2024)  Social Connections: Moderately Isolated (07/14/2024)  Tobacco Use: High Risk (07/13/2024)      Readmission Risk Interventions     No data to display

## 2024-07-23 NOTE — Discharge Summary (Signed)
 " Physician Discharge Summary   Patient: Rhonda Hickman MRN: [id number] DOB: 1956/01/20  Admit date:     07/13/2024  Discharge date: 07/23/2024  Discharge Physician: Garnette Pelt   PCP: Cicero Aureliano SAUNDERS, MD   Recommendations at discharge:    Follow up with PCP in 1-2 weeks  Discharge Diagnoses: Principal Problem:   Acute pulmonary embolism (HCC) Active Problems:   DVT (deep venous thrombosis) (HCC)   Community acquired pneumonia   Acute encephalopathy   Chronic atrial fibrillation (HCC)   AKI (acute kidney injury)   Abnormal urinalysis   Elevated troponin   Leukocytosis   Chronic diastolic CHF (congestive heart failure) (HCC)   Type 2 diabetes mellitus (HCC)   COPD (chronic obstructive pulmonary disease) (HCC)   Mood disorder   Obesity (BMI 30-39.9)   Pressure injury of skin   Dysphagia  Resolved Problems:   * No resolved hospital problems. 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 She was found to have a small PE and acute DVT, was also found to be in A-fib with RVR. She was placed on amiodarone  infusion, on heparin  infusion and admitted to the hospital. Chest imaging was also concerning for pneumonia and she was placed on antibiotics with ceftriaxone  and doxycycline .   Assessment and Plan: Principal problem Pulmonary embolism, acute DVT -patient has been on Xarelto , CT angiogram on admission showed a right lower lobe segmental pulmonary embolism without right heart strain.  Lower extremity ultrasound reveals a left lower extremity DVT that appears to be acute.  This might represent Xarelto  failure - She was placed on heparin , Dr. Trixie spoke with Dr. Lonn, given recent hip surgery, immobility, SNF, DVTs can still happen would not necessarily be  considered a full failure, however she does recommend switching to Eliquis  rather than Coumadin given higher Coumadin failure rates than NOACs. - Continued on Eliquis    Active problems Acute metabolic encephalopathy on underlying mild cognitive impairment-Dr. Trixie discussed at bedside with the patient's sister and niece, she has been having a gradual decline over the last year or so, more so after her hip and knee surgeries in the last few months.  They feel like she is significantly declining at this time around and is different than her baseline.  She was normally able to carry a conversation, but occasionally she would have a bad day - MRI brain reviewed. Neg   Community-acquired pneumonia-she did complain of a cough on admission, CT scan concerning for right lower lobe infection/inflammation.  -Completed course of abx   Paroxysmal A-fib, with RVR-patient was on amiodarone  infusion, rates are better, she was started on metoprolol    DM 2, with hyperglycemia-A1c 7.5.  Has been placed on sliding scale, continue, continue home metformin  on d/c   History of COPD-no wheezing, this is stable, on minimal O2 support   Troponin elevation-flat, not in a pattern consistent with ACS, this is likely due to her PE and potential pneumonia   Acute kidney injury-in the setting of acute illness, most recent creatinine in November was 0.8, on admission was 1.3.  Continue to monitor. Suspect that she does have a degree of CKD 3A given intermittent variation in her creatinine over the last 4 - 5 years.  Creatinine back to baseline   Hypomagnesemia-replenish magnesium  as needed  Chronic diastolic CHF-last echo from 2024 shows LVEF 55 to 60%, grade 1 DD.  Currently appears euvolemic   Obesity, class II-BMI 37, she would benefit from weight loss       Consultants:  Procedures performed:   Disposition: Skilled nursing facility Diet recommendation:  Carb modified diet DISCHARGE MEDICATION: Allergies as  of 07/23/2024       Reactions   Aspirin  Nausea Only, Other (See Comments)   Causes stomach pain        Medication List     STOP taking these medications    amiodarone  200 MG tablet Commonly known as: PACERONE    cefTRIAXone  1 g injection Commonly known as: ROCEPHIN    ipratropium-albuterol  0.5-2.5 (3) MG/3ML Soln Commonly known as: DUONEB   lidocaine  1 % (with preservative) injection Commonly known as: XYLOCAINE    meclizine  25 MG tablet Commonly known as: ANTIVERT    ondansetron  4 MG tablet Commonly known as: ZOFRAN    oxybutynin  5 MG tablet Commonly known as: DITROPAN    oxyCODONE  5 MG immediate release tablet Commonly known as: Oxy IR/ROXICODONE    Ozempic  (0.25 or 0.5 MG/DOSE) 2 MG/3ML Sopn Generic drug: Semaglutide (0.25 or 0.5MG /DOS)   sodium chloride  0.9 % infusion   tiZANidine  4 MG tablet Commonly known as: ZANAFLEX    Xarelto  20 MG Tabs tablet Generic drug: rivaroxaban        TAKE these medications    apixaban  5 MG Tabs tablet Commonly known as: ELIQUIS  Take 1 tablet (5 mg total) by mouth 2 (two) times daily.   budesonide -formoterol  160-4.5 MCG/ACT inhaler Commonly known as: Symbicort  Inhale 2 puffs into the lungs 2 (two) times daily.   buPROPion  150 MG 12 hr tablet Commonly known as: WELLBUTRIN  SR TAKE ONE TABLET TWICE DAILY   divalproex  500 MG 24 hr tablet Commonly known as: DEPAKOTE  ER Take 500-1,000 mg by mouth See admin instructions. Take 500 mg by mouth in the morning and 1000 mg at bedtime What changed: additional instructions   FeroSul 325 (65 Fe) MG tablet Generic drug: ferrous sulfate  TAKE ONE TABLET BY MOUTH DAILY AT 9AM WITH BREAKFAST   hydrOXYzine  25 MG tablet Commonly known as: ATARAX  Take 1 tablet (25 mg total) by mouth 3 (three) times daily as needed for itching or anxiety.   metFORMIN  1000 MG tablet Commonly known as: GLUCOPHAGE  Take 1 tablet (1,000 mg total) by mouth 2 (two) times daily. TAKE ONE TABLET BY MOUTH TWICE  DAILY @ 9AM & 5PM WITH MEALS   Metoprolol  Tartrate 37.5 MG Tabs Take 1 tablet (37.5 mg total) by mouth 2 (two) times daily. What changed:  medication strength how much to take additional instructions   nicotine  14 mg/24hr patch Commonly known as: NICODERM CQ  - dosed in mg/24 hours Place 1 patch (14 mg total) onto the skin daily. Start taking on: July 24, 2024   NovoLOG  FlexPen 100 UNIT/ML FlexPen Generic drug: insulin  aspart CBG 70 - 120: 0 units CBG 121 - 150: 0 units CBG 151 - 200: 0 units CBG 201 - 250: 2 units CBG 251 - 300: 3 units CBG 301 - 350: 4 units CBG 351 - 400: 5 units CBG > 400: call facility MD What changed: additional instructions   rosuvastatin  10 MG tablet Commonly known as: CRESTOR  Take 1 tablet (10 mg total) by mouth daily. Pt must keep upcoming followup appt with Cardiology in January 2026 for any more refills. Thank You   traZODone  50 MG tablet Commonly known as: DESYREL  Take 25 mg by mouth at bedtime.  Contact information for after-discharge care     Destination     [facility] .   Service: Skilled Nursing Contact information: [address] [place]  [address] [phone]                    Discharge Exam: Fredricka Weights   07/13/24 1455 07/14/24 1616  Weight: 124.7 kg 98.9 kg   General exam: Awake, laying in bed, in nad Respiratory system: Normal respiratory effort, no wheezing Cardiovascular system: regular rate, s1, s2 Gastrointestinal system: Soft, nondistended, positive BS Central nervous system: CN2-12 grossly intact, strength intact Extremities: Perfused, no clubbing Skin: Normal skin turgor, no notable skin lesions seen Psychiatry: Mood normal // no visual hallucinations   Condition at discharge: fair  The results of significant diagnostics from this hospitalization (including imaging, microbiology, ancillary and laboratory) are listed below for reference.   Imaging Studies: MR BRAIN WO  CONTRAST Result Date: 07/18/2024 EXAM: MRI BRAIN WITHOUT CONTRAST 07/17/2024 04:02:29 PM TECHNIQUE: Multiplanar multisequence MRI of the head/brain was performed without the administration of intravenous contrast. COMPARISON: MR Head without IV contrast 08/03/2019. CLINICAL HISTORY: Mental status change, unknown cause. FINDINGS: Motion limited study. BRAIN AND VENTRICLES: No acute infarct. No intracranial hemorrhage. No mass. No midline shift. No hydrocephalus. The sella is unremarkable. Normal flow voids. Moderate T2 hyperintensities in the white matter, compatible with chronic microvascular ischemic change. ORBITS: No significant abnormality. SINUSES AND MASTOIDS: No significant abnormality. BONES AND SOFT TISSUES: Normal marrow signal. IMPRESSION: 1. No acute intracranial abnormality. Electronically signed by: Gilmore Molt MD 07/18/2024 12:57 AM EST RP Workstation: [id number]   ECHOCARDIOGRAM COMPLETE Result Date: 07/14/2024    ECHOCARDIOGRAM REPORT   Patient Name:   SHTERNA LARAMEE Date of Exam: 07/14/2024 Medical Rec #:  [id number]      Height:       72.0 in Accession #:    [id number]     Weight:       274.9 lb Date of Birth:  [birth year]      BSA:          2.439 m Patient Age:    68 years       BP:           132/97 mmHg Patient Gender: F              HR:           77 bpm. Exam Location:  Inpatient Procedure: 2D Echo, Color Doppler, Cardiac Doppler and Intracardiac            Opacification Agent (Both Spectral and Color Flow Doppler were            utilized during procedure). Indications:    Pulmonary Embolus  History:        Patient has prior history of Echocardiogram examinations, most                 recent 08/16/2022. CAD, TIA, Stroke, CVA and COPD,                 Arrythmias:Atrial Fibrillation; Risk Factors:Diabetes, Family                 History of Coronary Artery Disease and Current Smoker. Pulmonary                 Embolus.  Sonographer:    Logan Shove RDCS Referring Phys: [id number] RONDELL A SMITH  IMPRESSIONS  1. Left ventricular ejection fraction, by estimation, is 55%. The  left ventricle has normal function. The left ventricle has no regional wall motion abnormalities. Diastology is indeterminate due to atrial fibrillation.  2. Right ventricular systolic function is normal. The right ventricular size is normal. Tricuspid regurgitation signal is inadequate for assessing PA pressure.  3. The mitral valve is normal in structure. Mild mitral valve regurgitation. No evidence of mitral stenosis.  4. The aortic valve is tricuspid. Aortic valve regurgitation is not visualized. No aortic stenosis is present.  5. The inferior vena cava is normal in size with greater than 50% respiratory variability, suggesting right atrial pressure of 3 mmHg. Comparison(s): No significant change from prior study. Conclusion(s)/Recommendation(s): No evidence of right heart strain by echocardiogram. Normal LV systolic function. FINDINGS  Left Ventricle: Left ventricular ejection fraction, by estimation, is 55%. The left ventricle has normal function. The left ventricle has no regional wall motion abnormalities. Definity  contrast agent was given IV to delineate the left ventricular endocardial borders. The left ventricular internal cavity size was normal in size. There is no left ventricular hypertrophy. Diastology is indeterminate due to atrial fibrillation. Right Ventricle: The right ventricular size is normal. No increase in right ventricular wall thickness. Right ventricular systolic function is normal. Tricuspid regurgitation signal is inadequate for assessing PA pressure. Left Atrium: Left atrial size was normal in size. Right Atrium: Right atrial size was normal in size. Pericardium: There is no evidence of pericardial effusion. Mitral Valve: The mitral valve is normal in structure. Mild mitral valve regurgitation. No evidence of mitral valve stenosis. Tricuspid Valve: The tricuspid valve is normal in structure. Tricuspid valve  regurgitation is trivial. No evidence of tricuspid stenosis. Aortic Valve: The aortic valve is tricuspid. Aortic valve regurgitation is not visualized. No aortic stenosis is present. Aortic valve peak gradient measures 3.8 mmHg. Pulmonic Valve: The pulmonic valve was not well visualized. Pulmonic valve regurgitation is not visualized. No evidence of pulmonic stenosis. Aorta: The aortic root and ascending aorta are structurally normal, with no evidence of dilitation. Venous: The inferior vena cava is normal in size with greater than 50% respiratory variability, suggesting right atrial pressure of 3 mmHg. IAS/Shunts: No atrial level shunt detected by color flow Doppler.  LEFT VENTRICLE PLAX 2D LVIDd:         3.90 cm LVIDs:         3.10 cm LV PW:         1.00 cm LV IVS:        1.00 cm LVOT diam:     2.10 cm LVOT Area:     3.46 cm  LV Volumes (MOD) LV vol d, MOD A2C: 115.0 ml LV vol d, MOD A4C: 108.0 ml LV vol s, MOD A2C: 63.6 ml LV vol s, MOD A4C: 64.0 ml LV SV MOD A2C:     51.4 ml LV SV MOD A4C:     108.0 ml LV SV MOD BP:      52.3 ml RIGHT VENTRICLE          IVC RV Basal diam:  3.00 cm  IVC diam: 1.40 cm LEFT ATRIUM           Index        RIGHT ATRIUM           Index LA diam:      3.60 cm 1.48 cm/m   RA Area:     11.30 cm LA Vol (A2C): 44.0 ml 18.04 ml/m  RA Volume:   25.80 ml  10.58 ml/m LA Vol (A4C): 49.6  ml 20.34 ml/m  AORTIC VALVE AV Area (Vmax): 3.82 cm AV Vmax:        98.00 cm/s AV Peak Grad:   3.8 mmHg LVOT Vmax:      108.00 cm/s  AORTA Ao Root diam: 2.90 cm Ao Asc diam:  2.90 cm  SHUNTS Systemic Diam: 2.10 cm Georganna Archer Electronically signed by Georganna Archer Signature Date/Time: 07/14/2024/2:56:26 PM    Final    VAS US  LOWER EXTREMITY VENOUS (DVT) (ONLY [facility] & [facility]) Result Date: 07/14/2024  Lower Venous DVT Study Patient Name:  VIRGIN ZELLERS  Date of Exam:   07/13/2024 Medical Rec #: [id number]       Accession #:    [id number] Date of Birth: May 12, 1956       Patient Gender: F Patient Age:   66 years  Exam Location:  [facility] Procedure:      VAS US  LOWER EXTREMITY VENOUS (DVT) Referring Phys: LONNI SAKAI --------------------------------------------------------------------------------  Indications: Tachycardia, and pulmonary embolism. Other Indications: Right total hip arthroplasty 5 weeks ago. Dementia. Currently                    resides in skilled nursing facility. Limitations: Body habitus and Patient condition. Comparison       No prior Right LEV on file. Prior negative left LEV done Study:           06/29/23 Performing Technologist: Alberta Lis RVS  Examination Guidelines: A complete evaluation includes B-mode imaging, spectral Doppler, color Doppler, and power Doppler as needed of all accessible portions of each vessel. Bilateral testing is considered an integral part of a complete examination. Limited examinations for reoccurring indications may be performed as noted. The reflux portion of the exam is performed with the patient in reverse Trendelenburg.  +---------+---------------+---------+-----------+----------+-------------------+ RIGHT    CompressibilityPhasicitySpontaneityPropertiesThrombus Aging      +---------+---------------+---------+-----------+----------+-------------------+ CFV      Full           Yes      Yes                                      +---------+---------------+---------+-----------+----------+-------------------+ SFJ      Full                                                             +---------+---------------+---------+-----------+----------+-------------------+ FV Prox  Full           Yes      Yes                                      +---------+---------------+---------+-----------+----------+-------------------+ FV Mid   Full           Yes      Yes                                      +---------+---------------+---------+-----------+----------+-------------------+ FV DistalFull           Yes      Yes                                       +---------+---------------+---------+-----------+----------+-------------------+  PFV      Full                                                             +---------+---------------+---------+-----------+----------+-------------------+ POP                     Yes      Yes                  patent by color and                                                       Doppler             +---------+---------------+---------+-----------+----------+-------------------+ PTV      Full                                                             +---------+---------------+---------+-----------+----------+-------------------+ PERO                                                  Not well visualized +---------+---------------+---------+-----------+----------+-------------------+   +---------+---------------+---------+-----------+----------+-------------------+ LEFT     CompressibilityPhasicitySpontaneityPropertiesThrombus Aging      +---------+---------------+---------+-----------+----------+-------------------+ CFV      Partial        Yes      Yes                  Acute distal                                                              portion of the vein +---------+---------------+---------+-----------+----------+-------------------+ SFJ      Full                                                             +---------+---------------+---------+-----------+----------+-------------------+ FV Prox  Partial        Yes      Yes                  Acute               +---------+---------------+---------+-----------+----------+-------------------+ FV Mid   Full                                                             +---------+---------------+---------+-----------+----------+-------------------+  FV DistalFull           Yes      Yes                                       +---------+---------------+---------+-----------+----------+-------------------+ PFV      Partial                                      Acute               +---------+---------------+---------+-----------+----------+-------------------+ POP                     Yes      Yes                  patent by color and                                                       Doppler             +---------+---------------+---------+-----------+----------+-------------------+ PTV      Full                                                             +---------+---------------+---------+-----------+----------+-------------------+ PERO     Full                                                             +---------+---------------+---------+-----------+----------+-------------------+     Summary: RIGHT: - There is no evidence of deep vein thrombosis in the lower extremity.  LEFT: - Findings consistent with acute deep vein thrombosis involving the left common femoral vein, left femoral vein, and left proximal profunda vein.   *See table(s) above for measurements and observations. Electronically signed by Penne Colorado MD on 07/14/2024 at 10:24:15 AM.    Final    CT Angio Chest PE W and/or Wo Contrast Result Date: 07/13/2024 EXAM: CTA CHEST 07/13/2024 12:33:01 PM TECHNIQUE: CTA of the chest was performed without and with the administration of 75 mL of intravenous contrast (iohexol  (OMNIPAQUE ) 350 MG/ML injection 75 mL IOHEXOL  350 MG/ML SOLN). Multiplanar reformatted images are provided for review. MIP images are provided for review. Automated exposure control, iterative reconstruction, and/or weight based adjustment of the mA/kV was utilized to reduce the radiation dose to as low as reasonably achievable. COMPARISON: None available. CLINICAL HISTORY: Pulmonary embolism (PE) suspected, high prob; DVT on US  with tachycardia. Rule out PE. FINDINGS: PULMONARY ARTERIES: Pulmonary arteries are adequately  opacified for evaluation. Right lower lobe subsegmental pulmonary embolus. Main pulmonary artery is normal in caliber. MEDIASTINUM: Prominent left ventricle with question of left ventricular hypertrophy. No enlarged RV to LV ratio. The pericardium demonstrates no acute abnormality. There is no acute abnormality of the thoracic aorta. Atherosclerotic plaque  in the aorta. Left anterior descending coronary artery calcification. LYMPH NODES: No mediastinal, hilar or axillary lymphadenopathy. LUNGS AND PLEURA: Left lower lobe soft tissue density versus mucous plugging within the left lower lobe bronchiole (4.71 cm). Left lower lobe atelectasis. Right lower lobe peribronchovascular tree-in-bud nodularity. No focal consolidation or pulmonary edema. No evidence of pleural effusion or pneumothorax. UPPER ABDOMEN: Limited images of the upper abdomen are unremarkable. Status post cholecystectomy. SOFT TISSUES AND BONES: No acute bone or soft tissue abnormality. IMPRESSION: 1. Right lower lobe subsegmental pulmonary embolus. No associated right heart strain or pulmonary infarction. 2. Left lower lobe soft tissue density versus mucous plugging within a left lower lobe bronchiole. 3. Right lower lobe peribronchovascular tree-in-bud nodularity. Findings suggestive of infection/inflammation. Electronically signed by: Morgane Naveau MD 07/13/2024 12:43 PM EST RP Workstation: [id number]   CT Head Wo Contrast Result Date: 07/13/2024 EXAM: CT HEAD WITHOUT 07/13/2024 10:35:46 AM TECHNIQUE: CT of the head was performed without the administration of intravenous contrast. Automated exposure control, iterative reconstruction, and/or weight based adjustment of the mA/kV was utilized to reduce the radiation dose to as low as reasonably achievable. COMPARISON: 05/14/2024 CLINICAL HISTORY: Mental status change, persistent or worsening; tachycardia, waxing and waning AMS. FINDINGS: BRAIN AND VENTRICLES: No acute intracranial hemorrhage. No  mass effect or midline shift. No extra-axial fluid collection. No evidence of acute infarct. Hypoattenuating foci in the cerebral white matter, most likely representing chronic small vessel disease. Prominence of the sulci and ventricles is compatible with brain atrophy. No hydrocephalus. ORBITS: No acute abnormality. SINUSES AND MASTOIDS: Paranasal sinuses and mastoid air cells are clear. SOFT TISSUES AND SKULL: No acute skull fracture. No acute soft tissue abnormality. IMPRESSION: 1. No acute intracranial abnormality. Electronically signed by: Waddell Calk MD 07/13/2024 11:02 AM EST RP Workstation: [id number]   DG Tibia/Fibula Right Result Date: 07/13/2024 CLINICAL DATA:  Right leg pain EXAM: RIGHT TIBIA AND FIBULA - 2 VIEW COMPARISON:  None Available. FINDINGS: Status post right total knee arthroplasty. No fracture or dislocation is noted. IMPRESSION: No acute abnormality seen. Electronically Signed   By: Lynwood Landy Raddle M.D.   On: 07/13/2024 10:29   DG Ankle Complete Right Result Date: 07/13/2024 CLINICAL DATA:  Right ankle pain EXAM: RIGHT ANKLE - COMPLETE 3+ VIEW COMPARISON:  None Available. FINDINGS: There is no evidence of fracture, dislocation, or joint effusion. There is no evidence of arthropathy or other focal bone abnormality. Soft tissues are unremarkable. IMPRESSION: Negative. Electronically Signed   By: Lynwood Landy Raddle M.D.   On: 07/13/2024 10:27   DG Chest Portable 1 View Result Date: 07/13/2024 CLINICAL DATA:  Tachycardia EXAM: PORTABLE CHEST 1 VIEW COMPARISON:  May 21, 2024 FINDINGS: The heart size and mediastinal contours are within normal limits. Right lung is clear. Minimal left basilar subsegmental atelectasis or scarring is noted. The visualized skeletal structures are unremarkable. IMPRESSION: Minimal left basilar subsegmental atelectasis or scarring. Electronically Signed   By: Lynwood Landy Raddle M.D.   On: 07/13/2024 10:26    Microbiology: Results for orders placed or  performed during the hospital encounter of 07/13/24  Resp panel by RT-PCR (RSV, Flu A&B, Covid) Anterior Nasal Swab     Status: None   Collection Time: 07/13/24  8:34 AM   Specimen: Anterior Nasal Swab  Result Value Ref Range Status   SARS Coronavirus 2 by RT PCR NEGATIVE NEGATIVE Final   Influenza A by PCR NEGATIVE NEGATIVE Final   Influenza B by PCR NEGATIVE NEGATIVE Final    Comment: (  NOTE) The Xpert Xpress SARS-CoV-2/FLU/RSV plus assay is intended as an aid in the diagnosis of influenza from Nasopharyngeal swab specimens and should not be used as a sole basis for treatment. Nasal washings and aspirates are unacceptable for Xpert Xpress SARS-CoV-2/FLU/RSV testing.  Fact Sheet for Patients: bloggercourse.com  Fact Sheet for Healthcare Providers: seriousbroker.it  This test is not yet approved or cleared by the [place]  FDA and has been authorized for detection and/or diagnosis of SARS-CoV-2 by FDA under an Emergency Use Authorization (EUA). This EUA will remain in effect (meaning this test can be used) for the duration of the COVID-19 declaration under Section 564(b)(1) of the Act, 21 U.S.C. section 360bbb-3(b)(1), unless the authorization is terminated or revoked.     Resp Syncytial Virus by PCR NEGATIVE NEGATIVE Final    Comment: (NOTE) Fact Sheet for Patients: bloggercourse.com  Fact Sheet for Healthcare Providers: seriousbroker.it  This test is not yet approved or cleared by the [place]  FDA and has been authorized for detection and/or diagnosis of SARS-CoV-2 by FDA under an Emergency Use Authorization (EUA). This EUA will remain in effect (meaning this test can be used) for the duration of the COVID-19 declaration under Section 564(b)(1) of the Act, 21 U.S.C. section 360bbb-3(b)(1), unless the authorization is terminated or revoked.  Performed at [facility], [address], [place], [place] [address]   Urine Culture (for pregnant, neutropenic or urologic patients or patients with an indwelling urinary catheter)     Status: Abnormal   Collection Time: 07/13/24  2:12 PM   Specimen: Urine, Catheterized  Result Value Ref Range Status   Specimen Description URINE, CATHETERIZED  Final   Special Requests   Final    NONE Performed at [facility], [address], [place], [place] [address]    Culture (A)  Final    60,000 COLONIES/mL ENTEROCOCCUS FAECIUM 20,000 COLONIES/mL ENTEROCOCCUS FAECALIS VANCOMYCIN RESISTANT ENTEROCOCCUS ISOLATED    Report Status 07/17/2024 FINAL  Final   Organism ID, Bacteria ENTEROCOCCUS FAECIUM (A)  Final   Organism ID, Bacteria ENTEROCOCCUS FAECALIS (A)  Final      Susceptibility   Enterococcus faecalis - MIC*    AMPICILLIN <=2 SENSITIVE Sensitive     NITROFURANTOIN <=16 SENSITIVE Sensitive     VANCOMYCIN 1 SENSITIVE Sensitive     * 20,000 COLONIES/mL ENTEROCOCCUS FAECALIS   Enterococcus faecium - MIC*    AMPICILLIN >=32 RESISTANT Resistant     NITROFURANTOIN 64 INTERMEDIATE Intermediate     VANCOMYCIN >=32 RESISTANT Resistant     * 60,000 COLONIES/mL ENTEROCOCCUS FAECIUM   *Note: Due to a large number of results and/or encounters for the requested time period, some results have not been displayed. A complete set of results can be found in Results Review.    Labs: CBC: Recent Labs  Lab 07/17/24 0310 07/18/24 0331 07/19/24 0330 07/22/24 0222  WBC 10.9* 8.5 9.5 8.3  HGB 9.8* 8.7* 10.7* 11.1*  HCT 30.1* 26.3* 32.2* 34.0*  MCV 78.6* 76.7* 77.8* 79.4*  PLT 207 199 224 221   Basic Metabolic Panel: Recent Labs  Lab 07/17/24 0310 07/18/24 0331 07/19/24 0330 07/22/24 0222  NA 139 135 136 135  K 3.6 3.3* 4.0 3.9  CL 108 106 105 102  CO2 22 22 22 24   GLUCOSE 115* 132* 133* 115*  BUN 11 9 8  7*  CREATININE 0.77 0.68 0.71 0.65  CALCIUM  8.5* 8.4* 8.6* 8.6*   Liver Function Tests: Recent  Labs  Lab 07/18/24  9668 07/19/24 0330 07/22/24 0222  AST 20 23 21   ALT 9 10 9   ALKPHOS 49 63 62  BILITOT <0.2 0.2 <0.2  PROT 5.6* 6.5 6.2*  ALBUMIN  2.1* 2.3* 2.3*   CBG: Recent Labs  Lab 07/22/24 0620 07/22/24 1138 07/22/24 1524 07/22/24 2055 07/23/24 0625  GLUCAP 101* 102* 91 124* 103*    Discharge time spent: less than 30 minutes.  Signed: Garnette Pelt, MD [facility] 07/23/2024 "

## 2024-07-24 NOTE — Progress Notes (Unsigned)
 " Cardiology Office Note:    Date:  07/24/2024   ID:  Rhonda Hickman, DOB 13-Aug-1955, MRN [id number]  PCP:  Cicero Aureliano SAUNDERS, MD  Cardiologist:  Lonni LITTIE [name], MD  Electrophysiologist:  None   Referring MD: Celestia Rosaline SQUIBB, NP   No chief complaint on file.   History of Present Illness:    Rhonda Hickman is a 69 y.o. female with a hx of TIA, atrial fibrillation, BPD, COPD, T2DM, asthma who presents for follow-up.  She was referred by Rosaline Celestia, NP for evaluation of atrial fibrillation, initially seen on 12/16/2019.  She reports that she was in the hospital for 5 days in [place]  in May 2021.  States that she was admitted with AKI and UTI and found to have atrial fibrillation.  Started on Xarelto .  States that she has been taking this without any bleeding issues.  Reports she has had intermittent palpitations over the last month since her discharge from the hospital.  She reports a history of stabbing chest pain in the center of her chest but has not had any recently, does however chest pressure with exertion.  States that going for walks causes her to have exertional chest pressure.  She is smoking about half a pack per day, down from peak of 2 packs/day.  States that both her mother and father had MIs but unsure of age.  TTE on 08/03/2019 showed LVEF 60-65%, normal RV function, normal biatrial size, no significant valvular disease.  Lexiscan  Myoview  on 01/03/2020 showed low risk stress test with breast attenuation but no ischemia, LVEF 48%.  Carotid duplex on 01/14/2020 showed bilateral 1 to 39% stenosis.  Zio patch x14 days on 01/29/2020 showed 1% atrial flutter burden, average heart rate 86 bpm.  Zio patch x3 days on 05/25/2020 showed no significant arrhythmias.  Echocardiogram 08/26/2022 showed normal biventricular function, grade 1 diastolic function, no significant valvular disease.  Coronary CTA on 11/03/2022 showed mild CAD (1 to 24% stenosis in proximal/mid LAD,  D1, and proximal/mid RCA), calcium  score 55 (80th percentile).  She was admitted 07/2024 with chest pain, found to have acute PE/DVT.  She had had hip surgery 04/2024.  She was switched from Xarelto  to Eliquis .  Echocardiogram 07/13/2024 showed EF 55%, normal RV function, no significant valvular disease.  Since last clinic visit,  she reports she has been under a lot of stress, reports her brother was recently diagnosed with cancer.  She continues to have intermittent chest pain, describes as pain sometimes in center of chest but sometimes in back.  Occurs about once per week.  Lasts less than 1 minute.  No relationship with exertion, but feels it may be related to stress.  She continues to have shortness of breath.  She is mostly staying in wheelchair now due to her knee pain but will sometimes walk with a walker.  She continues to smoke, currently about 1 pack/week.   Past Medical History:  Diagnosis Date   Anemia    Anxiety    Arthritis    Asthma    Atrial fibrillation (HCC)    Bipolar 1 disorder (HCC)    Bulging lumbar disc    Chronic pain of left knee    COPD (chronic obstructive pulmonary disease) (HCC)    Coronary artery disease    CVA (cerebral vascular accident) (HCC)    Diabetes mellitus    Dyspnea    Dysrhythmia    GERD (gastroesophageal reflux disease)    Neuropathy  TIA (transient ischemic attack)    Vertigo     Past Surgical History:  Procedure Laterality Date   ABDOMINAL HYSTERECTOMY     CARPAL TUNNEL RELEASE Right    CHOLECYSTECTOMY     KNEE ARTHROSCOPY WITH LATERAL MENISECTOMY Left 06/08/2021   Procedure: KNEE ARTHROSCOPY WITH LATERAL MENISCECTOMY AND MEDIAL MENISCECTOMY;  Surgeon: Margrette Taft BRAVO, MD;  Location: [facility];  Service: Orthopedics;  Laterality: Left;   KNEE ARTHROSCOPY WITH MEDIAL MENISECTOMY Right 04/14/2016   Procedure: KNEE ARTHROSCOPY WITH MEDIAL AND LATERAL MENISECTOMY, MICROFRACTURE REPAIR;  Surgeon: Taft BRAVO Margrette, MD;  Location: [facility];   Service: Orthopedics;  Laterality: Right;  lateral menisectomy - needs crutch training   TOTAL KNEE ARTHROPLASTY Left 06/20/2023   Procedure: LEFT TOTAL KNEE ARTHROPLASTY;  Surgeon: Vernetta Lonni GRADE, MD;  Location: [facility];  Service: Orthopedics;  Laterality: Left;   TOTAL KNEE ARTHROPLASTY Right 05/10/2024   Procedure: ARTHROPLASTY, KNEE, TOTAL;  Surgeon: Vernetta Lonni GRADE, MD;  Location: [facility];  Service: Orthopedics;  Laterality: Right;    Current Medications: No outpatient medications have been marked as taking for the 07/25/24 encounter (Appointment) with Kate Lonni CROME, MD.     Allergies:   Aspirin    Social History   Socioeconomic History   Marital status: Single    Spouse name: Not on file   Number of children: 0   Years of education: 12   Highest education level: High school graduate  Occupational History   Occupation: Unemployed  Tobacco Use   Smoking status: Some Days    Current packs/day: 0.50    Types: Cigarettes   Smokeless tobacco: Never   Tobacco comments:    tobacco info given  Vaping Use   Vaping status: Former  Substance and Sexual Activity   Alcohol use: No   Drug use: No   Sexual activity: Never  Other Topics Concern   Not on file  Social History Narrative   Lives at home alone.   Right-handed.   No caffeine use.   Social Drivers of Health   Tobacco Use: High Risk (07/13/2024)   Patient History    Smoking Tobacco Use: Some Days    Smokeless Tobacco Use: Never    Passive Exposure: Not on file  Financial Resource Strain: Not on file  Food Insecurity: No Food Insecurity (07/14/2024)   Epic    Worried About Programme Researcher, Broadcasting/film/video in the Last Year: Never true    Ran Out of Food in the Last Year: Never true  Transportation Needs: No Transportation Needs (07/14/2024)   Epic    Lack of Transportation (Medical): No    Lack of Transportation (Non-Medical): No  Physical Activity: Not on file  Stress: Not on file  Social Connections:  Moderately Isolated (07/14/2024)   Social Connection and Isolation Panel    Frequency of Communication with Friends and Family: Once a week    Frequency of Social Gatherings with Friends and Family: More than three times a week    Attends Religious Services: Never    Database Administrator or Organizations: No    Attends Banker Meetings: Never    Marital Status: Married  Depression (PHQ2-9): Low Risk (03/29/2024)   Depression (PHQ2-9)    PHQ-2 Score: 1  Alcohol Screen: Not on file  Housing: Low Risk (07/14/2024)   Epic    Unable to Pay for Housing in the Last Year: No    Number of Times Moved in the Last Year: 1  Homeless in the Last Year: No  Utilities: Not At Risk (07/14/2024)   Epic    Threatened with loss of utilities: No  Health Literacy: Not on file     Family History: The patient's family history includes Diabetes in her mother; Heart attack in her father.  ROS:   Please see the history of present illness.     All other systems reviewed and are negative.  EKGs/Labs/Other Studies Reviewed:    The following studies were reviewed today:   EKG:   06/14/22: Normal sinus rhythm, rate 77, left axis deviation, poor R wave progression 04/19/2023: Normal sinus rhythm, rate 73, left axis deviation  Recent Labs: 07/13/2024: Pro Brain Natriuretic Peptide 1,443.0; TSH 3.740 07/16/2024: Magnesium  1.7 07/22/2024: ALT 9; BUN 7; Creatinine, Ser 0.65; Hemoglobin 11.1; Platelets 221; Potassium 3.9; Sodium 135  Recent Lipid Panel    Component Value Date/Time   CHOL 119 05/29/2023 1447   TRIG 109 05/29/2023 1447   HDL 36 (L) 05/29/2023 1447   CHOLHDL 3.3 05/29/2023 1447   CHOLHDL 11.2 08/04/2019 0548   VLDL 32 08/04/2019 0548   LDLCALC 63 05/29/2023 1447    Physical Exam:    VS:  There were no vitals taken for this visit.    Wt Readings from Last 3 Encounters:  07/14/24 218 lb 0.6 oz (98.9 kg)  06/04/24 240 lb (108.9 kg)  05/14/24 233 lb 11 oz (106 kg)     GEN: in  no acute distress HEENT: Normal NECK: No JVD LYMPHATICS: No lymphadenopathy CARDIAC: RRR, no murmurs, rubs, gallops RESPIRATORY:  Clear to auscultation without rales, wheezing or rhonchi  ABDOMEN: Soft, non-tender, non-distended MUSCULOSKELETAL:  No edema SKIN: Warm and dry NEUROLOGIC:  Alert and oriented x 3 PSYCHIATRIC:  Normal affect   ASSESSMENT:    No diagnosis found.    PLAN:    Acute PE/DVT: She was admitted 07/2024 with chest pain, found to have acute PE/DVT.  She had had hip surgery 04/2024.  She was switched from Xarelto  to Eliquis .  Echocardiogram 07/13/2024 showed EF 55%, normal RV function, no significant valvular disease.  Chest pain: Atypical in description but does have significant risk factors (type 2 diabetes, hypertension, tobacco use).  TTE on 08/03/2019 showed LVEF 60-65%, normal RV function, normal biatrial size, no significant valvular disease.  Lexiscan  Myoview  on 01/03/2020 showed low risk stress test with breast attenuation but no ischemia, LVEF 48%.  Given ongoing chest pain, Coronary CTA was done on 11/03/2022 showed mild CAD (1 to 24% stenosis in proximal/mid LAD, D1, and proximal/mid RCA), calcium  score 55 (80th percentile).  Dizziness: unclear cause.  Head CT negative.  Othostatics in clinic were unremarkable.  Possibly vertigo by description, started on meclizine  and referred to neurology for further evaluation.  Brain MRI on 06/16/2020 was unremarkable.  Zio patch x3 days on 05/25/2020 showed no significant arrhythmias.  Atrial fibrillation: CHA2DS2-VASc score 5 (hypertension, diabetes, CVA, female).  Zio patch x14 days on 01/29/2020 showed 1% atrial flutter burden, average heart rate 86 bpm. -Continue Eliquis  -Continue metoprolol  25 mg twice daily  Hypertension: On amlodipine  5 mg daily, metoprolol  50 mg twice daily.  Appears controlled  T2DM: On insulin .  A1c 7.3% on 02/2023  Hyperlipidemia: On rosuvastatin  10 mg daily.  LDL 53 on 06/20/22.  We will check  lipid panel  Tobacco use: Patient counseled on the risks of tobacco use and cessation strongly encouraged.    TIA: Follows with neurology.  Carotid duplex on 01/14/2020 showed bilateral 1 to 39%  stenosis.  Zio patch x14 days on 01/29/2020 showed 1% atrial flutter burden, average heart rate 86 bpm.  Snoring: sleep study with mild OSA, no indication for CPAP at this time  Rectal bleeding: Has noted intermittent bright red blood per rectum.  On Xarelto  for atrial fibrillation.  Will check CBC.  Had planned for colonoscopy but reports did not have done, encouraged follow-up***  RTC in 6 months***  Medication Adjustments/Labs and Tests Ordered: Current medicines are reviewed at length with the patient today.  Concerns regarding medicines are outlined above.  No orders of the defined types were placed in this encounter.  No orders of the defined types were placed in this encounter.   There are no Patient Instructions on file for this visit.   Signed, Lonni LITTIE [name], MD  07/24/2024 5:51 PM    [facility] "

## 2024-07-25 ENCOUNTER — Ambulatory Visit: Admitting: Cardiology

## 2024-07-29 ENCOUNTER — Telehealth (INDEPENDENT_AMBULATORY_CARE_PROVIDER_SITE_OTHER): Payer: Self-pay | Admitting: Primary Care

## 2024-07-29 NOTE — Telephone Encounter (Signed)
 Copied from CRM [id number]. Topic: General - Other >> Jul 29, 2024 11:38 AM Delon HERO wrote: Reason for CRM: Zoie calling from [facility] is calling to see if the document For Medical Necessity was received. Original date of fax- 04/17/24, 06/20/24, 07/18/24, 07/29/2024 CB- [phone] Fax- [phone]

## 2024-07-29 NOTE — Telephone Encounter (Signed)
Received fax will give to provider

## 2024-07-31 NOTE — Telephone Encounter (Signed)
 Note patient is in a NH

## 2024-08-02 NOTE — Telephone Encounter (Signed)
 Reached out to [facility] in regards to a fax being received that pt is being discharge and they no longer supplying pt with supplies. Spoke with Burnard BIRCH. She informed that the fax we had received was due to pt being in a nursing home   No other actions needed

## 2024-08-06 NOTE — Progress Notes (Unsigned)
 "   Chief Complaint: No chief complaint on file.   History of Present Illness:  Rhonda Hickman is a 69 y.o. female who is seen in consultation from Cicero Aureliano SAUNDERS, MD for evaluation of ***.   Past Medical History:  Past Medical History:  Diagnosis Date   Anemia    Anxiety    Arthritis    Asthma    Atrial fibrillation (HCC)    Bipolar 1 disorder (HCC)    Bulging lumbar disc    Chronic pain of left knee    COPD (chronic obstructive pulmonary disease) (HCC)    Coronary artery disease    CVA (cerebral vascular accident) (HCC)    Diabetes mellitus    Dyspnea    Dysrhythmia    GERD (gastroesophageal reflux disease)    Neuropathy    TIA (transient ischemic attack)    Vertigo     Past Surgical History:  Past Surgical History:  Procedure Laterality Date   ABDOMINAL HYSTERECTOMY     CARPAL TUNNEL RELEASE Right    CHOLECYSTECTOMY     KNEE ARTHROSCOPY WITH LATERAL MENISECTOMY Left 06/08/2021   Procedure: KNEE ARTHROSCOPY WITH LATERAL MENISCECTOMY AND MEDIAL MENISCECTOMY;  Surgeon: Margrette Taft BRAVO, MD;  Location: [facility];  Service: Orthopedics;  Laterality: Left;   KNEE ARTHROSCOPY WITH MEDIAL MENISECTOMY Right 04/14/2016   Procedure: KNEE ARTHROSCOPY WITH MEDIAL AND LATERAL MENISECTOMY, MICROFRACTURE REPAIR;  Surgeon: Taft BRAVO Margrette, MD;  Location: [facility];  Service: Orthopedics;  Laterality: Right;  lateral menisectomy - needs crutch training   TOTAL KNEE ARTHROPLASTY Left 06/20/2023   Procedure: LEFT TOTAL KNEE ARTHROPLASTY;  Surgeon: Vernetta Lonni GRADE, MD;  Location: [facility];  Service: Orthopedics;  Laterality: Left;   TOTAL KNEE ARTHROPLASTY Right 05/10/2024   Procedure: ARTHROPLASTY, KNEE, TOTAL;  Surgeon: Vernetta Lonni GRADE, MD;  Location: [facility];  Service: Orthopedics;  Laterality: Right;    Allergies:  Allergies[1]  Family History:  Family History  Problem Relation Age of Onset   Diabetes Mother    Heart attack Father     Social History:   Social History[2]  Review of symptoms:  Constitutional:  Negative for unexplained weight loss, night sweats, fever, chills ENT:  Negative for nose bleeds, sinus pain, painful swallowing CV:  Negative for chest pain, shortness of breath, exercise intolerance, palpitations, loss of consciousness Resp:  Negative for cough, wheezing, shortness of breath GI:  Negative for nausea, vomiting, diarrhea, bloody stools GU:  Positives noted in HPI; otherwise negative for gross hematuria, dysuria, urinary incontinence Neuro:  Negative for seizures, poor balance, limb weakness, slurred speech Psych:  Negative for lack of energy, depression, anxiety Endocrine:  Negative for polydipsia, polyuria, symptoms of hypoglycemia (dizziness, hunger, sweating) Hematologic:  Negative for anemia, purpura, petechia, prolonged or excessive bleeding, use of anticoagulants  Allergic:  Negative for difficulty breathing or choking as a result of exposure to anything; no shellfish allergy; no allergic response (rash/itch) to materials, foods  Physical exam: There were no vitals taken for this visit. GENERAL APPEARANCE:  Well appearing, well developed, well nourished, NAD HEENT: Atraumatic, Normocephalic. NECK: Normal appearance LUNGS: Normal inspiratory and expiratory excursion HEART: Regular Rate ABDOMEN: *** EXTREMITIES: Moves all extremities well.  Without clubbing, cyanosis, or edema. NEUROLOGIC:  Alert and oriented x 3, normal gait, CN II-XII grossly intact.  MENTAL STATUS:  Appropriate. SKIN:  Warm, dry and intact.    Results: No results found. However, due to the size of the patient record, not all encounters were searched. Please  check Results Review for a complete set of results.  I have reviewed prior patient's records  I have reviewed referring/prior physicians records  I have reviewed urinalysis  I have reviewed prior urine cultures  I reviewed prior imaging studies  Assessment: No diagnosis  found.   Plan: ***     [1]  Allergies Allergen Reactions   Aspirin  Nausea Only and Other (See Comments)    Causes stomach pain  [2]  Social History Tobacco Use   Smoking status: Some Days    Current packs/day: 0.50    Types: Cigarettes   Smokeless tobacco: Never   Tobacco comments:    tobacco info given  Vaping Use   Vaping status: Former  Substance Use Topics   Alcohol use: No   Drug use: No   "

## 2024-08-07 ENCOUNTER — Ambulatory Visit: Admitting: Urology

## 2024-08-14 ENCOUNTER — Other Ambulatory Visit

## 2024-08-14 ENCOUNTER — Ambulatory Visit: Admitting: Physician Assistant

## 2024-08-14 ENCOUNTER — Ambulatory Visit: Admitting: Urology

## 2024-08-14 ENCOUNTER — Encounter: Payer: Self-pay | Admitting: Physician Assistant

## 2024-08-14 DIAGNOSIS — M79641 Pain in right hand: Secondary | ICD-10-CM

## 2024-08-14 DIAGNOSIS — Z96651 Presence of right artificial knee joint: Secondary | ICD-10-CM | POA: Diagnosis not present

## 2024-08-14 NOTE — Progress Notes (Signed)
 HPI: [name] returns today with only EMS accompanying her.  She is at a skilled facility.  She is here for postop visit s/p right total knee arthroplasty 05/10/2024.  EMS states that she has been told the patient has been nonweightbearing.  Since visit with our office she has been hospitalized due to an acute PE/DVT, community-acquired pneumonia, acute encephalopathy, acute kidney failure and A-fib RVR.  She is on chronic anticoagulation as she was on prior to the most recent hospitalization.  Patient has bipolar disorder and increasing decline in mental status.  She is unable to relate anything that other than she has a right hand that is bothering her.  She cannot state whether or not she had any injury to that hand.  EMS reports that she is now bedbound at the skilled facility.  When she was last seen in our office she was seated in a wheelchair and was slightly confused but able to answer questions.  Review of systems: See HPI otherwise negative   Physical exam: General: Well-developed well-nourished female in no acute distress. Respirations: Unlabored Extremities: Bilateral heels with early skin breakdown posterior heels.  Right knee surgical incisions well-healed. Right knee full extension any attempts of flexion causes her extreme pain.  Calf supple nontender. Right hand: No ecchymosis erythema.  No abnormal warmth.  Global tenderness throughout.  Edema throughout the hand.  Radiographs: Right hand 3 views: No acute fractures acute findings.  Degenerative changes throughout mostly involving the PIP joints of the 2nd through 5th fingers.  Impression: Right hand pain unknown etiology Status post right total knee arthroplasty Bilateral heel early skin breakdown  Plan: Recommend the floating of heels to prevent further skin breakdown.  Heels were floated before the patient left the office today.  Recommend therapy to work on range of motion of the hand to help with swelling and pain.  Can  regards to the right knee recommend range of motion strengthening right leg as tolerated.  She will follow-up with Dr. Netta and myself as needed.  The above was placed on the report form from the skilled facility.

## 2024-08-15 ENCOUNTER — Telehealth (INDEPENDENT_AMBULATORY_CARE_PROVIDER_SITE_OTHER): Payer: Self-pay | Admitting: Primary Care

## 2024-08-15 NOTE — Telephone Encounter (Signed)
 Copied from CRM [id number]. Topic: General - Other >> Aug 15, 2024 12:19 PM Burnard DEL wrote: Reason for CRM: Patients sister Dickey is requesting a phone call from provider or medical assistant to discuss her sisters health conditions. She stated that her sister just had a knee surgery and is not doing so weel and she would like to speak with provider in regards to this.

## 2024-08-26 ENCOUNTER — Ambulatory Visit: Admitting: Physician Assistant

## 2024-09-09 ENCOUNTER — Ambulatory Visit: Admitting: Urology
